# Patient Record
Sex: Male | Born: 1955 | Race: Black or African American | Hispanic: No | Smoking: Current every day smoker
Health system: Southern US, Community
[De-identification: ages and names within clinical notes are randomized; demographics above are authoritative.]

## PROBLEM LIST (undated history)

## (undated) ENCOUNTER — Emergency Department: Discharge status: Absent without leave | Payer: Medicare Other

## (undated) DIAGNOSIS — F329 Major depressive disorder, single episode, unspecified: Secondary | ICD-10-CM

## (undated) DIAGNOSIS — I509 Heart failure, unspecified: Secondary | ICD-10-CM

## (undated) DIAGNOSIS — R7989 Other specified abnormal findings of blood chemistry: Secondary | ICD-10-CM

## (undated) DIAGNOSIS — N186 End stage renal disease: Secondary | ICD-10-CM

## (undated) DIAGNOSIS — N289 Disorder of kidney and ureter, unspecified: Secondary | ICD-10-CM

## (undated) DIAGNOSIS — F32A Depression, unspecified: Secondary | ICD-10-CM

## (undated) DIAGNOSIS — I5189 Other ill-defined heart diseases: Secondary | ICD-10-CM

## (undated) DIAGNOSIS — I2699 Other pulmonary embolism without acute cor pulmonale: Secondary | ICD-10-CM

## (undated) DIAGNOSIS — I77810 Thoracic aortic ectasia: Secondary | ICD-10-CM

## (undated) DIAGNOSIS — I2510 Atherosclerotic heart disease of native coronary artery without angina pectoris: Secondary | ICD-10-CM

## (undated) DIAGNOSIS — I219 Acute myocardial infarction, unspecified: Secondary | ICD-10-CM

## (undated) DIAGNOSIS — R778 Other specified abnormalities of plasma proteins: Secondary | ICD-10-CM

## (undated) DIAGNOSIS — I10 Essential (primary) hypertension: Secondary | ICD-10-CM

## (undated) HISTORY — PX: TOTAL HIP ARTHROPLASTY: SHX124

---

## 2004-08-28 ENCOUNTER — Other Ambulatory Visit: Payer: Self-pay

## 2004-08-28 ENCOUNTER — Inpatient Hospital Stay: Payer: Self-pay | Admitting: Internal Medicine

## 2008-06-18 ENCOUNTER — Inpatient Hospital Stay (HOSPITAL_COMMUNITY): Admission: EM | Admit: 2008-06-18 | Discharge: 2008-06-23 | Payer: Self-pay | Admitting: Emergency Medicine

## 2008-06-18 ENCOUNTER — Ambulatory Visit: Payer: Self-pay | Admitting: Internal Medicine

## 2008-06-19 ENCOUNTER — Encounter (INDEPENDENT_AMBULATORY_CARE_PROVIDER_SITE_OTHER): Payer: Self-pay | Admitting: *Deleted

## 2008-06-27 ENCOUNTER — Encounter (INDEPENDENT_AMBULATORY_CARE_PROVIDER_SITE_OTHER): Payer: Self-pay | Admitting: Internal Medicine

## 2008-06-27 ENCOUNTER — Ambulatory Visit: Payer: Self-pay | Admitting: Infectious Diseases

## 2008-06-27 DIAGNOSIS — I10 Essential (primary) hypertension: Secondary | ICD-10-CM

## 2008-06-27 LAB — CONVERTED CEMR LAB
BUN: 18 mg/dL (ref 6–23)
Calcium: 9 mg/dL (ref 8.4–10.5)
Creatinine, Ser: 1.62 mg/dL — ABNORMAL HIGH (ref 0.40–1.50)
Glucose, Bld: 77 mg/dL (ref 70–99)
Potassium: 3.9 meq/L (ref 3.5–5.3)

## 2008-06-28 DIAGNOSIS — B1710 Acute hepatitis C without hepatic coma: Secondary | ICD-10-CM

## 2008-06-28 DIAGNOSIS — F1910 Other psychoactive substance abuse, uncomplicated: Secondary | ICD-10-CM

## 2008-06-28 DIAGNOSIS — E785 Hyperlipidemia, unspecified: Secondary | ICD-10-CM

## 2008-07-22 ENCOUNTER — Encounter: Payer: Self-pay | Admitting: *Deleted

## 2008-07-22 LAB — CONVERTED CEMR LAB: LDL Cholesterol: 151 mg/dL

## 2010-10-28 ENCOUNTER — Emergency Department (HOSPITAL_COMMUNITY): Payer: Self-pay

## 2010-10-28 ENCOUNTER — Inpatient Hospital Stay (HOSPITAL_COMMUNITY)
Admission: EM | Admit: 2010-10-28 | Discharge: 2010-10-30 | DRG: 305 | Discharge status: Against Medical Advice | Payer: Self-pay | Attending: Infectious Diseases | Admitting: Infectious Diseases

## 2010-10-28 DIAGNOSIS — R079 Chest pain, unspecified: Secondary | ICD-10-CM

## 2010-10-28 DIAGNOSIS — E785 Hyperlipidemia, unspecified: Secondary | ICD-10-CM | POA: Diagnosis present

## 2010-10-28 DIAGNOSIS — R0789 Other chest pain: Secondary | ICD-10-CM | POA: Diagnosis present

## 2010-10-28 DIAGNOSIS — F1410 Cocaine abuse, uncomplicated: Secondary | ICD-10-CM | POA: Diagnosis present

## 2010-10-28 DIAGNOSIS — B1920 Unspecified viral hepatitis C without hepatic coma: Secondary | ICD-10-CM | POA: Diagnosis present

## 2010-10-28 DIAGNOSIS — G43909 Migraine, unspecified, not intractable, without status migrainosus: Secondary | ICD-10-CM | POA: Diagnosis present

## 2010-10-28 DIAGNOSIS — I10 Essential (primary) hypertension: Principal | ICD-10-CM | POA: Diagnosis present

## 2010-10-28 DIAGNOSIS — F17200 Nicotine dependence, unspecified, uncomplicated: Secondary | ICD-10-CM | POA: Diagnosis present

## 2010-10-28 DIAGNOSIS — F1210 Cannabis abuse, uncomplicated: Secondary | ICD-10-CM | POA: Diagnosis present

## 2010-10-28 LAB — POCT CARDIAC MARKERS
CKMB, poc: 2.6 ng/mL (ref 1.0–8.0)
Troponin i, poc: <0.05 ng/mL (ref 0.00–0.09)

## 2010-10-28 LAB — MRSA PCR SCREENING: MRSA by PCR: NEGATIVE

## 2010-10-28 LAB — CBC
MCV: 86.5 fL (ref 78.0–100.0)
Platelets: 145 10*3/uL — ABNORMAL LOW (ref 150–400)
RDW: 13.6 % (ref 11.5–15.5)
WBC: 8.2 10*3/uL (ref 4.0–10.5)

## 2010-10-28 LAB — COMPREHENSIVE METABOLIC PANEL
BUN: 17 mg/dL (ref 6–23)
Calcium: 8.7 mg/dL (ref 8.4–10.5)
Glucose, Bld: 103 mg/dL — ABNORMAL HIGH (ref 70–99)
Total Protein: 7.1 g/dL (ref 6.0–8.3)

## 2010-10-28 LAB — RAPID URINE DRUG SCREEN, HOSP PERFORMED
Cocaine: NOT DETECTED
Opiates: POSITIVE — AB

## 2010-10-28 LAB — URINALYSIS, ROUTINE W REFLEX MICROSCOPIC
Bilirubin Urine: NEGATIVE
Hgb urine dipstick: NEGATIVE
Specific Gravity, Urine: 1.012 (ref 1.005–1.030)
Urobilinogen, UA: 1 mg/dL (ref 0.0–1.0)
pH: 6.5 (ref 5.0–8.0)

## 2010-10-28 LAB — LIPASE, BLOOD: Lipase: 38 U/L (ref 11–59)

## 2010-10-28 LAB — GLUCOSE, CAPILLARY: Glucose-Capillary: 106 mg/dL — ABNORMAL HIGH (ref 70–99)

## 2010-10-28 LAB — DIFFERENTIAL
Eosinophils Absolute: 0.1 10*3/uL (ref 0.0–0.7)
Eosinophils Relative: 1 % (ref 0–5)
Lymphs Abs: 1.3 10*3/uL (ref 0.7–4.0)

## 2010-10-28 LAB — PROTIME-INR: INR: 0.97 (ref 0.00–1.49)

## 2010-10-28 LAB — CK TOTAL AND CKMB (NOT AT ARMC)
Relative Index: 1.8 (ref 0.0–2.5)
Total CK: 160 U/L (ref 7–232)

## 2010-10-28 LAB — URINE MICROSCOPIC-ADD ON

## 2010-10-28 LAB — ETHANOL: Alcohol, Ethyl (B): <5 mg/dL (ref 0–10)

## 2010-10-28 LAB — TSH: TSH: 0.364 u[IU]/mL (ref 0.350–4.500)

## 2010-10-28 LAB — MAGNESIUM: Magnesium: 1.9 mg/dL (ref 1.5–2.5)

## 2010-10-29 ENCOUNTER — Inpatient Hospital Stay (HOSPITAL_COMMUNITY): Payer: Self-pay

## 2010-10-29 DIAGNOSIS — R079 Chest pain, unspecified: Secondary | ICD-10-CM

## 2010-10-29 LAB — BASIC METABOLIC PANEL
CO2: 23 mEq/L (ref 19–32)
Calcium: 8.3 mg/dL — ABNORMAL LOW (ref 8.4–10.5)
Calcium: 8.7 mg/dL (ref 8.4–10.5)
Creatinine, Ser: 1.22 mg/dL (ref 0.4–1.5)
Creatinine, Ser: 1.05 mg/dL (ref 0.4–1.5)
GFR calc Af Amer: >60 mL/min (ref >60)
Glucose, Bld: 148 mg/dL — ABNORMAL HIGH (ref 70–99)
Sodium: 137 mEq/L (ref 135–145)

## 2010-10-29 LAB — LIPID PANEL
Cholesterol: 170 mg/dL (ref 0–200)
LDL Cholesterol: 105 mg/dL — ABNORMAL HIGH (ref 0–99)
Triglycerides: 129 mg/dL (ref <150)
VLDL: 26 mg/dL (ref 0–40)

## 2010-10-29 LAB — CBC
MCHC: 35.8 g/dL (ref 30.0–36.0)
RDW: 13.7 % (ref 11.5–15.5)

## 2010-10-29 LAB — CARDIAC PANEL(CRET KIN+CKTOT+MB+TROPI)
CK, MB: 2.9 ng/mL (ref 0.3–4.0)
Total CK: 136 U/L (ref 7–232)
Troponin I: 0.04 ng/mL (ref 0.00–0.06)

## 2010-10-29 LAB — HEMOGLOBIN A1C
Hgb A1c MFr Bld: 5.7 % — ABNORMAL HIGH (ref <5.7)
Mean Plasma Glucose: 117 mg/dL — ABNORMAL HIGH (ref <117)

## 2010-10-29 LAB — HIV ANTIBODY (ROUTINE TESTING W REFLEX): HIV: NONREACTIVE

## 2010-10-30 LAB — BASIC METABOLIC PANEL
BUN: 17 mg/dL (ref 6–23)
Calcium: 8.9 mg/dL (ref 8.4–10.5)
Creatinine, Ser: 1.31 mg/dL (ref 0.4–1.5)
GFR calc non Af Amer: 57 mL/min — ABNORMAL LOW (ref >60)
Glucose, Bld: 103 mg/dL — ABNORMAL HIGH (ref 70–99)
Sodium: 138 mEq/L (ref 135–145)

## 2010-10-30 LAB — CBC
HCT: 41.9 % (ref 39.0–52.0)
MCH: 30.7 pg (ref 26.0–34.0)
MCHC: 35.3 g/dL (ref 30.0–36.0)
RDW: 14.1 % (ref 11.5–15.5)

## 2010-10-31 DIAGNOSIS — R079 Chest pain, unspecified: Secondary | ICD-10-CM

## 2010-11-11 NOTE — Discharge Summary (Signed)
[name], [name] NO.:  [id number]  MEDICAL RECORD NO.:  [id number]           PATIENT TYPE:  I  LOCATION:  [id number]                         FACILITY:  [facility]  PHYSICIAN:  Alison Murray, M.D.  DATE OF BIRTH:  1956-01-24  DATE OF ADMISSION:  10/28/2010 DATE OF DISCHARGE:  10/30/2010                              DISCHARGE SUMMARY   This patient left against medical advice on day #3 of his admission.  DISCHARGE DIAGNOSES: 1. Chest pain, cause unknown. The patient did not have any evidence of acute coronary syndrome on his     EKG or cardiac enzymes during this admission. 2. Uncontrolled hypertension.  The patient's blood pressure ranged in     systolic [id number] during this hospitalization, on medical     management.  He is not compliant with prescribed medicines as     outpatient. 3. Headache, probably secondary to acute migraine.  A noncontrast CT     head negative for any evidence of bleeding or mass. 4. Hepatitis C.  The patient has refused treatment in the past. 5. Polysubstance abuse.  The patient has a history of smoking tobacco,     marijuana, and cocaine in the past. 6. Hyperlipidemia.  PROCEDURES PERFORMED:  Noncontrast CT scan of the head for nonresolving headache which was negative for any intracranial abnormality.  It did show mild atrophy and scattered lacunar infarct and periventricular chronic ischemic white matter disease.  This probably from his uncontrolled hypertension for years.  CONSULTATIONS:  None.  ADMITTING HISTORY AND PHYSICAL:  A 55 year old man with past medical history dictated above, presented to the emergency room with chief complaint of chest pain and headache since night prior to admission.  He complained of having sharp lower sternal to epigastric pain, continuous, since 1 night prior to admission, started when he was sitting in a chair, 10/10 in intensity, getting worse and is not associated with any food intake.  Also,  it is not positional and nothing makes it better or worse.  Denies any shortness of breath, cough, fevers, or chills.  He also complained of bifrontal headache which is also 10/10, continuous, dull, not associated with vision change or photophobia.  He denied any recent cough, sore throat, or nasal drip.  ADMISSION PHYSICAL EXAMINATION:  VITAL SIGNS:  Temperature 99.7, pulse 80-90, blood pressure 0000000 systolic, [id number] diastolic, respiratory rate 18, oxygen saturation 100% on room air. GENERAL:  No acute distress.  Mild discomfort from headache. HEENT: Pupils equal and reactive to light.  Extraocular muscles intact. NECK:  Supple.  No JVD.  No carotid bruits. LUNGS:  Clear to auscultation bilaterally. CARDIOVASCULAR SYSTEM:  S1 and S2 normal.  Regular rate and rhythm. ABDOMEN:  Soft, mildly tender to palpation on epigastric area.  No guarding or rigidity.  No hepatosplenomegaly. EXTREMITIES:  No edema bilaterally. NEUROLOGIC:  Alert and oriented x3.  Motor and sensory exam normal. Reflexes normal and equal bilaterally.  Gait normal.  Cerebellar signs negative.  LABS AT ADMISSION:  His hemoglobin, white count, platelet count, and blood chemistry were unremarkable at admission.  His liver function test did show an elevated AST  of 87 and ALT of 110.  Point-of-care cardiac markers were negative.  Urine drug screen was positive for opiates which was prior to him receiving opiates in the emergency room.  HOSPITAL COURSE: 1. Chest pain.  The patient did complain of severe chest pain on     admission but no objective evidence of ACS on his EKG or cardiac     enzymes.  Initially, it was considered this pain was secondary to     severe hypertension or migraine headache.  We did not pursue aortic     dissection as he did not have typical pain and his blood pressure     was equal in both of his arms.  He did not have any risk factors     for pulmonary embolism and we did not proceed with [place]  further     imaging for this problem.  We kept him on telemetry unit and it did     not show any evidence of cardiac arrhythmias during his hospital     stay here.  We cycled his EKG and cardiac enzymes during this     hospital course without any evidence of acute coronary syndrome.     We obtained a lipid profile to stratify his risk for coronary     artery disease which showed LDL of 105, HDL of 39, and a total     cholesterol of 170.  His HbA1c was 5.7 and TSH was normal.  His     chest pain was better by day #2 of hospitalization without any     acute intervention.  He left the hospital against medical advice     this time and we have not worked this up further as the cause of     his chest pain. 2. Hypertension.  The patient's blood pressure was uncontrolled at the     time of admission.  His systolic ranged between 0000000.  He was     supposed to be on beta-blocker, ACE inhibitor, HCTZ prior to     admission, but he does not take any medicines at home as per his     history.  We started him on these medicines which was supposed to     take prior to admission and his blood pressure stayed fairly     controlled, although was not all the way back to normal.  We were     not able to give him any prescriptions and instructions prior to     discharge because he left the hospital against medical advice. 3. Headache.  The patient did complain of acute headache, bilateral     frontal headache at the time of admission without any red flags.     We tried Tylenol and nonsteroidal antiinflammatory drugs for his     headaches, but was not controlled.  We also got his noncontrast CT     head which was negative for any acute cerebral event for the cause     of his headaches.  We did not give him any Triptans and did not     start any management for migraine headaches given his high blood     pressure.  He left the hospital against medical advice by this time     and we have not worked this up  further.  LABORATORY DATA:  Prior to leaving the hospital, sodium 138, potassium 3.6, chloride 104, bicarb 26, glucose 103, BUN 17, creatinine 1.31, calcium 8.9.  White count 7.4, hemoglobin 14.8, platelet count 148.  HIV nonreactive.  DISCHARGE VITALS:  Temperature 98.4, pulse 69, blood pressure 164/125, oxygen saturation 98% on room air.     Lester Stanly, MD   ______________________________ Alison Murray, M.D.    MD/MEDQ  D:  11/01/2010  T:  11/02/2010  Job:  [id number]  Electronically Signed by Lester Middleton  on 11/10/2010 12:12:55 PM Electronically Signed by Lars Mage M.D. on 11/11/2010 02:04:31 PM

## 2010-11-23 NOTE — Consult Note (Signed)
[name], [name] NO.:  [id number]   MEDICAL RECORD NO.:  [id number]          PATIENT TYPE:  INP   LOCATION:  2921                         FACILITY:  [facility]   PHYSICIAN:  Shaune Pascal. Bensimhon, MDDATE OF BIRTH:  1956-07-08   DATE OF CONSULTATION:  06/18/2008  DATE OF DISCHARGE:                                 CONSULTATION   PRIMARY CARDIOLOGIST:  Shaune Pascal. Bensimhon, MD   PRIMARY CARE PHYSICIAN:  The patient has been seen by [facility] doctor in the  past, but not with any recent visits.   REQUESTING PHYSICIAN:  Resident for family practice.   REASON FOR CONSULTATION:  Chest pain and hypertensive urgency.   HISTORY OF PRESENT ILLNESS:  This is a 55 year old obese African  American male with known of hypertension who has now been taking  medications for over 4 years but has been diagnosed with hypertension  for greater than 10 years.  The patient came to the emergency room  secondary to chest pain which began yesterday, which he described as  sharp in midsternal, constant and worsening last night with no change in  intensity since that time.  The patient was brought to the emergency  room and found to be severely hypertensive with a blood pressure of  209/156.  The patient was given nitroglycerin and started on a drip, and  we are asked to see as a result.  The patient did have positive cardiac  enzymes at 0.18.  The patient is complaining of substernal chest  discomfort and has admitted to use of cocaine within the last 48 hours.  The patient also admits to beer and marijuana use within the last 48  hours with prior abuse of EtOH and cocaine and marijuana.  He states  that he stopped for approximately 1 month but has returned to the  habits.  Currently, the patient is in the ER with remaining hypertensive  and nitroglycerin with complaints of chest discomfort.   PAST MEDICAL HISTORY:  1. Hypertension greater than 10 years.  2. Medical noncompliance.   SOCIAL  HISTORY:  He lives in [place] with his wife.  He is in  roofing.  He has 2 boys.  He has a 15+ pack year smoker, heavy beer  drinking and use of cocaine and marijuana within the last 48 hours.   FAMILY HISTORY:  Mother, father, and siblings with hypertension.   CURRENT MEDICATIONS:  None.   ALLERGIES:  None.   CURRENT LABS:  CK 525, MB 4.0, troponin 0.18, albumin 3.4, AST 63, total  troponin 7.3, and lipase 25.  Sodium 140, potassium 3.2, chloride 100,  CO2 of 29, BUN 10, creatinine 1.7, and glucose 83.  Chest x-ray  revealing cardiomegaly.  Head CT revealing no discrete intracranial  process.  EKG revealing sinus tachycardia at 97% beats per minute with  incomplete right bundle-branch block and left ventricular hypertrophy.  He has a prolonged QT interval at 0.495.   PHYSICAL EXAMINATION:  VITAL SIGNS:  Currently, blood pressure 199/156,  heart rate 95, and respirations 16.  HEENT:  Head is normocephalic and atraumatic.  Eyes, PERRLA.  Mucous  membranes of mouth are pink and moist.  Tongue is midline.  NECK:  Supple without JVD or carotid bruits appreciated.  CARDIOVASCULAR:  Regular rate and rhythm, slightly tachycardic with S4  murmur auscultated.  Pulses are 2+ and equal without bruits.  LUNGS:  Clear to auscultation with some mild crackles in the left base.  ABDOMEN:  Obese and nontender.  Bowel sounds are 2+.  EXTREMITIES:  Without clubbing, cyanosis, or edema.  NEUROLOGIC:  Cranial nerves II through XII are grossly intact.   IMPRESSION:  1. Hypertensive urgency.  2. Admit to cocaine and marijuana use within the last 48 hours.  3. Ethyl alcohol use.  4. Medical noncompliance.   PLAN:  This is a 55 year old obese African American male who is admitted  to the hospital with chest pain.  We were asked to consult secondary to  hypertensive urgency and positive cardiac enzyme.  The patient's chest  pain is fairly atypical.  In fact, most pain seems to be epigastric.  EKG is  nondiagnostic.  My suspicion is that increased troponins and MB  are likely secondary to hypertensive heart disease.  However, given  myriad of risk factors, we will proceed with catheterization once blood  pressure is stabilized and renal function is also stabilized.  Blood  pressure control, her primary to keep blood pressure 160-170 for now and  began to reduce it slowly to avoid CVA.  In the interim, the patient  will be placed on Coreg 6.25 mg b.i.d.  We will also give hydralazine 10  mg q.2 h. p.r.n.  We will also check lipids and LFTs, monitor renal  function and follow making further recommendations throughout hospital  course depending upon the patient's response to treatment.  We would  like to thank the [facility] for allowing [place] to  participate in the care of this patient.      Phill Myron. Purcell Nails, NP      Butts Bensimhon, MD  Electronically Signed    KML/MEDQ  D:  06/18/2008  T:  06/19/2008  Job:  [id number]

## 2010-11-23 NOTE — Discharge Summary (Signed)
NAMEMACALEB, SWINEA NO.:  [id number]   MEDICAL RECORD NO.:  [id number]          PATIENT TYPE:  INP   LOCATION:  5501                         FACILITY:  [facility]   PHYSICIAN:  Rico Sheehan, D.O.  DATE OF BIRTH:  [birth year]   DATE OF ADMISSION:  06/18/2008  DATE OF DISCHARGE:  06/23/2008                               DISCHARGE SUMMARY   DISCHARGE DIAGNOSES:  1. 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  2. Hypertensive urgency.  3. Polysubstance abuse, including ethanol and cocaine.  4. Hepatitis C, newly diagnosed, viral load 895,000.  5. Hypertension, untreated.  6. History of migraine headache, per patient.  7. Obesity.  8. Hyperlipidemia.  9. Tobacco abuse.   DISCHARGE MEDICATIONS:  1. Coreg 25 mg p.o. b.i.d.  2. Lisinopril - hydrochlorothiazide 20 - 25 mg p.o. daily.  3. Aspirin 325 mg p.o. daily.  4. Tylenol 650 mg p.o. q.6 h p.r.n. pain.   DISCHARGE CONDITION AND FOLLOWUP:  The patient was discharged in stable  condition, his chest pain having resolved and his blood pressure having  improved on oral medication.  He was instructed to stop using alcohol  and cocaine.  At first, he requested to have follow up at the [facility] in  [place].  However, his paperwork with them concerning eligibility was not  up-to-date and they were unable to offer him an appointment prior to  February 2010.  Therefore, he was offered an appointment at the [facility] with Dr. Caryn Section On Friday, December 18, at 1:50  p.m.  At this appointment, the items requiring attention are:  1. Blood pressure.  In the hospital, this ranged from approximately      0000000 systolic and he was started on Coreg and      lisinopril/hydrochlorothiazide.  2. Renal function:  The patient is to have a stat. BMET prior to the      appointment and this should be checked for creatinine and      potassium.  The patient's creatinine in the  hospital ranged from      approximately 1.4-1.7.  The patient's potassium was in the 3.1-3.6      range while in the hospital.  3. Hyperlipidemia:  The patient is at risk due to his hyperlipidemia      and hypertension.  However, he also has active hepatitis C.  It is      worthwhile discussing with him whether he is interested starting a      statin at this point.  On the other hand, it he intends to follow      up at the [facility], this issue may be deferred to his doctor      there.  4. Hepatitis C.  The patient has newly diagnosed hepatitis.  He was      not interested in treatment for this while in the hospital.  If he      intends to follow up at our clinic in [place], he may benefit      from referral to the Hepatitis  C Clinic.   PROCEDURES PERFORMED:  Hepatitis C:  1. Noncontrast CT head dated June 18, 2008, showed no acute      intracranial process.  There were chronic small vessel ischemic      changes noted.  2. A 2-D echocardiogram showed severe asymmetric left ventricular      hypertrophy with EF 55%.  3. Cardiac catheterization dated June 20, 2008, showed normal      coronary angiography, no evidence of renal artery stenosis and      severe hypertension with good left ventricular function and marked      LVH by echocardiography.   CONSULTATIONS:  Dr. Haroldine Laws of Cardiology and Dr. Olevia Perches of  Cardiology.   BRIEF ADMITTING HISTORY AND PHYSICAL:  The patient is a 55 year old male  who presented to the [facility] Emergency Department with chest pain  that started approximately 3:00 p.m. the day prior to admission.  Location was substernal and epigastric without radiation or accompanying  symptoms.  It was 10/10 and constant and had not responded to  medications in the ED.  There were no relieving or worsening factors  including eating, drinking, positions, or deep breaths.  No prior  similar symptoms.  He had no nausea or vomiting.  He did note taking   Excedrin frequently for headaches in the days prior to admission, but he  could not recall how much.  He did admit to recent relapse and using  cocaine and alcohol 2 weeks prior to admission, the last time within 24  hours of admission.   PHYSICAL EXAMINATION:  VITAL SIGNS:  Temperature 98.1, blood pressure  199/156, pulse 99, respirations 16, and oxygen saturation 96% room air.  GENERAL:  He was an obese, middle-aged male in moderate distress due to  pain.  HEENT:  Pupils were 1 mm bilaterally and poorly reactive.  NECK:  No murmurs or masses.  LUNGS:  Clear to auscultation bilaterally with good air movement.  HEART:  Regular rate and rhythm with no murmurs, rubs, or gallops.  EXTREMITIES:  His peripheral pulses were 2+ in both the upper and lower  extremities.  Extremities had no edema.  ABDOMEN:  Obese.  Bowel sounds were active.  Abdomen was soft,  nontender, and nondistended.  No palpable masses or organomegaly.  SKIN:  No rashes or lesions.  NEUROLOGIC:  He was alert and oriented x3.  Cranial nerves grossly  intact and he was able to move all 4 extremities.   LABORATORY DATA:  Sodium 140, potassium 3.2, chloride 100, bicarbonate  29, BUN 10, creatinine 1.7, glucose 83, bilirubin 1.5, alk phos 56, AST  63, ALT 60, protein 7.3, albumin 3.4, and calcium 9.4.  White blood  count 8.9, hemoglobin 15.1, and platelets 197.  Ethanol less than 5.  Lipase 25.  Urinalysis significant for protein greater than 300.  Point-  of-care cardiac markers, CK 525 and troponin 0.18.  Urine drug screen  positive for cocaine and opiates.  EKG showed poor R-wave progression  and strain pattern.   HOSPITAL COURSE:  1. Chest pain:  Due to the patient's presentation and positive set of      point-of-care markers, Cardiology was consulted early.  The patient      was placed on a nitroglycerin drip and admitted to the step-down      unit.  His chest pain resolved over his first night of admission.      His  troponins decreased as his hypertensive urgency was controlled.  The 2-D echocardiograms and cardiac catheterizations were obtained      with the findings described above.  The patient was counseled on      the importance of abstaining from cocaine and ethanol.  The      importance of adhering to his antihypertensive medications was also      stressed.  2. Hypertension:  The patient was initially placed on a nitroglycerin      drip with hydralazine p.r.n..  He was then started on Coreg and      amlodipine.  He was transitioned off the nitroglycerin drip and      lisinopril and hydrochlorothiazide were started as well.  His blood      pressure continued to fluctuate between the 0000000 and A999333 systolic      with the diastolic ranging from 0000000 to 130s.  He was discharged      with the medications described above.  He may need to have      amlodipine in addition to these.  Affording amlodipine may be an      issue unless he can arrange to get this through the [facility].  3. Chronic renal insufficiency:  The patient's creatinine on admission      was 1.7.  This trended down to 1.4-1.5 range.  His true baseline is      not known.  The elevated creatinine and proteinuria are strongly      suggestive that the etiology is due to chronic uncontrolled      hypertension.  He was placed on an ACE inhibitor for renal      protective effects.  4. Polysubstance abuse:  The patient was counseled on importance of      abstaining from cocaine in order to prevent further damage to his      heart and kidneys and the risk of stroke.  5. Hepatitis C:  Due to the patient's elevated transaminases, an acute      hepatitis panel was checked and this revealed that he was reactive      for hepatitis C.  This was a new diagnosis and a viral load was      checked which was 895,000.  The patient declined treatment for this      which was offered while he was an inpatient.  This item will      require follow up as an  outpatient.  6. Hyperlipidemia:  The patient was not started on a statin due to his      active hepatitis C.  As an outpatient, it would be appropriate to      discuss the risks and benefits for him starting a statin.  7. Tobacco abuse.  The patient was counseled on the importance of      quitting smoking.   DISCHARGE LABORATORIES AND VITALS:  Temperature [id number], systolic blood  pressure 123456, diastolic blood pressure A999333, pulse 95-64,  respirations 20, and oxygen saturation 95-99% room air.  Sodium 138,  potassium 3.9, chloride 107, bicarbonate 27, BUN 14, creatinine 1.5, and  glucose 89.  The patient's lipid panel was cholesterol 198, HDL 33, LDL  151, and triglycerides 71.      Lajean Saver, MD  Electronically Signed      Rico Sheehan, D.O.  Electronically Signed    PN/MEDQ  D:  06/24/2008  T:  06/25/2008  Job:  [id number]   cc:   [facility]

## 2010-11-23 NOTE — Cardiovascular Report (Signed)
NAMEANSUMANA, GENS NO.:  [id number]   MEDICAL RECORD NO.:  [id number]          PATIENT TYPE:  INP   LOCATION:  2921                         FACILITY:  [facility]   PHYSICIAN:  Vanna Scotland. Olevia Perches, MD, FACCDATE OF BIRTH:  [birth year]   DATE OF PROCEDURE:  06/20/2008  DATE OF DISCHARGE:                            CARDIAC CATHETERIZATION   CLINICAL HISTORY:  Mr. Borth is a 55 year old and has no prior history  of known heart disease.  He does have a long history of hypertension.  He is admitted with hypertensive urgency and his troponins returned  positive.  He did have chest pain, which is somewhat atypical for  ischemia.  His screen for cocaine was positive.   PROCEDURE:  The procedure was performed with right femoral artery, an  arterial sheath, and 5-French Judkins coronary catheters.  Left  ventricular pressures were measured, but a left ventriculogram was not  performed to save contrast.  His creatinine was 1.7.  This aortogram was  performed to evaluate for possible renovascular hypertension.  The  patient tolerated the procedure well and left the laboratory in  satisfactory condition.   RESULTS:  The left main coronary artery:  The left main coronary artery  was free of any significant disease.   Circumflex artery:  The circumflex artery gave rise to a large marginal  branch and a large posterolateral branch.  These vessels were free of  any significant disease.   Left anterior descending artery:  Left anterior descending artery gave  rise to 2 diagonal branches and several septal perforators.  The vessel  was somewhat underfilled, but with multiple views, there appeared to be  no significant obstruction.   Right coronary artery:  The right coronary artery was a moderate-sized  vessel gave rise to conus branch, 2 right ventricle branches, posterior  branch, and 2 posterolateral branches.  There were some irregularities  in the vessel, but no significant  obstruction.   No left ventriculogram was performed.   A distal aortogram:  A distal aortogram was performed, which showed  patent renal arteries and no significant aortoiliac obstruction.   Aortic pressure was 167/122 with a mean of 143.  Left ventricle pressure  was 167/36.   CONCLUSION:  1. Normal coronary angiography.  2. No evidence of renal artery stenosis.  3. Severe hypertension with good LV function and marked LVH by      echocardiography.   RECOMMENDATIONS:  In view of these findings, I think the patient's  elevated troponin may be related to cocaine and possibly hypertensive  urgency.  The main issue at this point is further treatment of his  hypertension and avoidance of substance abuse.      Bruce Alfonso Patten Olevia Perches, MD, [facility]  Electronically Signed     BRB/MEDQ  D:  06/20/2008  T:  06/21/2008  Job:  [id number]   cc:   Rico Sheehan, D.O.  Cardiopulmonary Lab

## 2011-03-10 ENCOUNTER — Emergency Department: Payer: Self-pay | Admitting: Orthopedic Surgery

## 2011-04-05 ENCOUNTER — Emergency Department: Payer: Self-pay | Admitting: Emergency Medicine

## 2011-04-15 LAB — URINALYSIS, ROUTINE W REFLEX MICROSCOPIC
Glucose, UA: NEGATIVE mg/dL
Leukocytes, UA: NEGATIVE
Protein, ur: >300 mg/dL — AB
Specific Gravity, Urine: 1.021 (ref 1.005–1.030)
Urobilinogen, UA: 1 mg/dL (ref 0.0–1.0)

## 2011-04-15 LAB — DIFFERENTIAL
Basophils Absolute: 0 10*3/uL (ref 0.0–0.1)
Basophils Absolute: 0 10*3/uL (ref 0.0–0.1)
Basophils Relative: 0 % (ref 0–1)
Basophils Relative: 0 % (ref 0–1)
Eosinophils Absolute: 0 10*3/uL (ref 0.0–0.7)
Eosinophils Relative: 0 % (ref 0–5)
Lymphocytes Relative: 14 % (ref 12–46)
Lymphocytes Relative: 28 % (ref 12–46)
Lymphs Abs: 1.2 10*3/uL (ref 0.7–4.0)
Monocytes Absolute: 0.5 10*3/uL (ref 0.1–1.0)
Monocytes Absolute: 0.6 10*3/uL (ref 0.1–1.0)
Monocytes Relative: 6 % (ref 3–12)
Monocytes Relative: 7 % (ref 3–12)
Neutro Abs: 5.7 10*3/uL (ref 1.7–7.7)
Neutro Abs: 7 10*3/uL (ref 1.7–7.7)
Neutrophils Relative %: 65 % (ref 43–77)
Neutrophils Relative %: 80 % — ABNORMAL HIGH (ref 43–77)

## 2011-04-15 LAB — HEPATITIS PANEL, ACUTE
HCV Ab: REACTIVE — AB
Hep A IgM: NEGATIVE
Hep B C IgM: NEGATIVE
Hepatitis B Surface Ag: NEGATIVE

## 2011-04-15 LAB — CBC
HCT: 39.7 % (ref 39.0–52.0)
HCT: 44.8 % (ref 39.0–52.0)
Hemoglobin: 12.8 g/dL — ABNORMAL LOW (ref 13.0–17.0)
Hemoglobin: 12.9 g/dL — ABNORMAL LOW (ref 13.0–17.0)
Hemoglobin: 13.3 g/dL (ref 13.0–17.0)
Hemoglobin: 15.1 g/dL (ref 13.0–17.0)
MCHC: 33.5 g/dL (ref 30.0–36.0)
MCHC: 33.8 g/dL (ref 30.0–36.0)
MCHC: 33.9 g/dL (ref 30.0–36.0)
MCV: 87.2 fL (ref 78.0–100.0)
Platelets: 174 10*3/uL (ref 150–400)
Platelets: 197 10*3/uL (ref 150–400)
RBC: 4.33 MIL/uL (ref 4.22–5.81)
RBC: 4.51 MIL/uL (ref 4.22–5.81)
RDW: 13.7 % (ref 11.5–15.5)
RDW: 13.7 % (ref 11.5–15.5)
RDW: 13.9 % (ref 11.5–15.5)
WBC: 7.1 10*3/uL (ref 4.0–10.5)

## 2011-04-15 LAB — BASIC METABOLIC PANEL
BUN: 14 mg/dL (ref 6–23)
BUN: 15 mg/dL (ref 6–23)
CO2: 25 mEq/L (ref 19–32)
CO2: 27 mEq/L (ref 19–32)
CO2: 27 mEq/L (ref 19–32)
Calcium: 8 mg/dL — ABNORMAL LOW (ref 8.4–10.5)
Calcium: 8.3 mg/dL — ABNORMAL LOW (ref 8.4–10.5)
Calcium: 8.6 mg/dL (ref 8.4–10.5)
Calcium: 8.7 mg/dL (ref 8.4–10.5)
Calcium: 9 mg/dL (ref 8.4–10.5)
Chloride: 105 mEq/L (ref 96–112)
Chloride: 107 mEq/L (ref 96–112)
Creatinine, Ser: 1.57 mg/dL — ABNORMAL HIGH (ref 0.4–1.5)
Creatinine, Ser: 1.62 mg/dL — ABNORMAL HIGH (ref 0.4–1.5)
Creatinine, Ser: 1.75 mg/dL — ABNORMAL HIGH (ref 0.4–1.5)
GFR calc Af Amer: 50 mL/min — ABNORMAL LOW (ref >60)
GFR calc Af Amer: 55 mL/min — ABNORMAL LOW (ref >60)
GFR calc non Af Amer: 41 mL/min — ABNORMAL LOW (ref >60)
GFR calc non Af Amer: 54 mL/min — ABNORMAL LOW (ref >60)
Glucose, Bld: 110 mg/dL — ABNORMAL HIGH (ref 70–99)
Glucose, Bld: 87 mg/dL (ref 70–99)
Glucose, Bld: 89 mg/dL (ref 70–99)
Sodium: 130 mEq/L — ABNORMAL LOW (ref 135–145)
Sodium: 137 mEq/L (ref 135–145)
Sodium: 138 mEq/L (ref 135–145)
Sodium: 140 mEq/L (ref 135–145)
Sodium: 140 mEq/L (ref 135–145)

## 2011-04-15 LAB — HEPATIC FUNCTION PANEL
Albumin: 2.9 g/dL — ABNORMAL LOW (ref 3.5–5.2)
Alkaline Phosphatase: 51 U/L (ref 39–117)
Indirect Bilirubin: 0.8 mg/dL (ref 0.3–0.9)
Total Bilirubin: 1.5 mg/dL — ABNORMAL HIGH (ref 0.3–1.2)

## 2011-04-15 LAB — COMPREHENSIVE METABOLIC PANEL
Albumin: 3.4 g/dL — ABNORMAL LOW (ref 3.5–5.2)
BUN: 10 mg/dL (ref 6–23)
Creatinine, Ser: 1.7 mg/dL — ABNORMAL HIGH (ref 0.4–1.5)
Glucose, Bld: 83 mg/dL (ref 70–99)
Total Protein: 7.3 g/dL (ref 6.0–8.3)

## 2011-04-15 LAB — PROTIME-INR
INR: 1 (ref 0.00–1.49)
Prothrombin Time: 13.2 seconds (ref 11.6–15.2)

## 2011-04-15 LAB — URINE MICROSCOPIC-ADD ON

## 2011-04-15 LAB — POCT CARDIAC MARKERS
CKMB, poc: 19 ng/mL (ref 1.0–8.0)
Myoglobin, poc: 390 ng/mL (ref 12–200)
Troponin i, poc: <0.05 ng/mL (ref 0.00–0.09)

## 2011-04-15 LAB — RAPID URINE DRUG SCREEN, HOSP PERFORMED
Amphetamines: NOT DETECTED
Barbiturates: NOT DETECTED
Benzodiazepines: NOT DETECTED
Opiates: POSITIVE — AB
Tetrahydrocannabinol: NOT DETECTED

## 2011-04-15 LAB — HIV 1/2 CONFIRMATION: HIV-2 Ab: NEGATIVE

## 2011-04-15 LAB — CARDIAC PANEL(CRET KIN+CKTOT+MB+TROPI)
CK, MB: 5.9 ng/mL — ABNORMAL HIGH (ref 0.3–4.0)
CK, MB: 9.4 ng/mL — ABNORMAL HIGH (ref 0.3–4.0)
Relative Index: 3.1 — ABNORMAL HIGH (ref 0.0–2.5)
Total CK: 166 U/L (ref 7–232)
Troponin I: 0.06 ng/mL (ref 0.00–0.06)

## 2011-04-15 LAB — LIPID PANEL
HDL: 33 mg/dL — ABNORMAL LOW (ref >39)
Triglycerides: 71 mg/dL (ref <150)
VLDL: 14 mg/dL (ref 0–40)

## 2011-04-15 LAB — CK TOTAL AND CKMB (NOT AT ARMC): Relative Index: 2.7 — ABNORMAL HIGH (ref 0.0–2.5)

## 2011-04-15 LAB — APTT: aPTT: 29 seconds (ref 24–37)

## 2011-04-15 LAB — HCV RNA QUANT
HCV Quantitative Log: 5.95 {Log} — ABNORMAL HIGH (ref <1.63)
HCV Quantitative: 895000 IU/mL — ABNORMAL HIGH (ref <43)

## 2011-04-15 LAB — MAGNESIUM: Magnesium: 2 mg/dL (ref 1.5–2.5)

## 2011-04-15 LAB — TROPONIN I: Troponin I: 0.18 ng/mL — ABNORMAL HIGH (ref 0.00–0.06)

## 2011-05-08 ENCOUNTER — Emergency Department: Payer: Self-pay | Admitting: Unknown Physician Specialty

## 2011-07-03 ENCOUNTER — Emergency Department: Payer: Self-pay | Admitting: Unknown Physician Specialty

## 2011-07-07 ENCOUNTER — Emergency Department: Payer: Self-pay | Admitting: Unknown Physician Specialty

## 2011-07-10 ENCOUNTER — Emergency Department: Payer: Self-pay | Admitting: Emergency Medicine

## 2014-01-01 LAB — CBC
HCT: 43.6 % (ref 40.0–52.0)
HGB: 14.5 g/dL (ref 13.0–18.0)
MCH: 28.8 pg (ref 26.0–34.0)
MCHC: 33.2 g/dL (ref 32.0–36.0)
MCV: 87 fL (ref 80–100)
Platelet: 185 10*3/uL (ref 150–440)
RBC: 5.02 10*6/uL (ref 4.40–5.90)
RDW: 13.8 % (ref 11.5–14.5)
WBC: 8.2 10*3/uL (ref 3.8–10.6)

## 2014-01-01 LAB — URINALYSIS, COMPLETE
BILIRUBIN, UR: NEGATIVE
Glucose,UR: NEGATIVE mg/dL (ref 0–75)
Ketone: NEGATIVE
Leukocyte Esterase: NEGATIVE
Nitrite: NEGATIVE
Ph: 6 (ref 4.5–8.0)
Protein: >=500
SPECIFIC GRAVITY: 1.017 (ref 1.003–1.030)
Squamous Epithelial: <1

## 2014-01-01 LAB — COMPREHENSIVE METABOLIC PANEL
ANION GAP: 7 (ref 7–16)
Albumin: 2.4 g/dL — ABNORMAL LOW (ref 3.4–5.0)
Alkaline Phosphatase: 77 U/L
BUN: 18 mg/dL (ref 7–18)
Bilirubin,Total: 0.4 mg/dL (ref 0.2–1.0)
CALCIUM: 8.5 mg/dL (ref 8.5–10.1)
CREATININE: 1.56 mg/dL — AB (ref 0.60–1.30)
Chloride: 109 mmol/L — ABNORMAL HIGH (ref 98–107)
Co2: 28 mmol/L (ref 21–32)
EGFR (African American): 57 — ABNORMAL LOW
EGFR (Non-African Amer.): 49 — ABNORMAL LOW
GLUCOSE: 104 mg/dL — AB (ref 65–99)
Osmolality: 289 (ref 275–301)
Potassium: 3.6 mmol/L (ref 3.5–5.1)
SGOT(AST): 90 U/L — ABNORMAL HIGH (ref 15–37)
SGPT (ALT): 109 U/L — ABNORMAL HIGH (ref 12–78)
SODIUM: 144 mmol/L (ref 136–145)
Total Protein: 6.6 g/dL (ref 6.4–8.2)

## 2014-01-01 LAB — CK: CK, TOTAL: 167 U/L

## 2014-01-02 ENCOUNTER — Inpatient Hospital Stay: Payer: Self-pay | Admitting: Internal Medicine

## 2014-01-03 LAB — BASIC METABOLIC PANEL
Anion Gap: 7 (ref 7–16)
BUN: 13 mg/dL (ref 7–18)
Calcium, Total: 8 mg/dL — ABNORMAL LOW (ref 8.5–10.1)
Chloride: 108 mmol/L — ABNORMAL HIGH (ref 98–107)
Co2: 27 mmol/L (ref 21–32)
Creatinine: 1.35 mg/dL — ABNORMAL HIGH (ref 0.60–1.30)
EGFR (Non-African Amer.): 59 — ABNORMAL LOW
Glucose: 85 mg/dL (ref 65–99)
Osmolality: 282 (ref 275–301)
POTASSIUM: 3.9 mmol/L (ref 3.5–5.1)
Sodium: 142 mmol/L (ref 136–145)

## 2014-01-03 LAB — CBC WITH DIFFERENTIAL/PLATELET
BASOS ABS: 0 10*3/uL (ref 0.0–0.1)
Basophil %: 0.4 %
Eosinophil #: 0 10*3/uL (ref 0.0–0.7)
Eosinophil %: 0.7 %
HCT: 38.8 % — AB (ref 40.0–52.0)
HGB: 13.1 g/dL (ref 13.0–18.0)
Lymphocyte #: 2 10*3/uL (ref 1.0–3.6)
Lymphocyte %: 34.2 %
MCH: 29.9 pg (ref 26.0–34.0)
MCHC: 33.8 g/dL (ref 32.0–36.0)
MCV: 88 fL (ref 80–100)
MONO ABS: 0.6 x10 3/mm (ref 0.2–1.0)
Monocyte %: 10.8 %
NEUTROS PCT: 53.9 %
Neutrophil #: 3.2 10*3/uL (ref 1.4–6.5)
Platelet: 158 10*3/uL (ref 150–440)
RBC: 4.39 10*6/uL — ABNORMAL LOW (ref 4.40–5.90)
RDW: 13.7 % (ref 11.5–14.5)
WBC: 5.9 10*3/uL (ref 3.8–10.6)

## 2014-01-05 LAB — BASIC METABOLIC PANEL
Anion Gap: 6 — ABNORMAL LOW (ref 7–16)
BUN: 27 mg/dL — ABNORMAL HIGH (ref 7–18)
CALCIUM: 8.2 mg/dL — AB (ref 8.5–10.1)
Chloride: 103 mmol/L (ref 98–107)
Co2: 28 mmol/L (ref 21–32)
Creatinine: 2.08 mg/dL — ABNORMAL HIGH (ref 0.60–1.30)
EGFR (African American): 40 — ABNORMAL LOW
GFR CALC NON AF AMER: 35 — AB
GLUCOSE: 93 mg/dL (ref 65–99)
OSMOLALITY: 279 (ref 275–301)
Potassium: 3.7 mmol/L (ref 3.5–5.1)
SODIUM: 137 mmol/L (ref 136–145)

## 2014-01-06 LAB — BASIC METABOLIC PANEL
ANION GAP: 5 — AB (ref 7–16)
BUN: 40 mg/dL — AB (ref 7–18)
Calcium, Total: 8.3 mg/dL — ABNORMAL LOW (ref 8.5–10.1)
Chloride: 105 mmol/L (ref 98–107)
Co2: 25 mmol/L (ref 21–32)
Creatinine: 2.01 mg/dL — ABNORMAL HIGH (ref 0.60–1.30)
EGFR (African American): 42 — ABNORMAL LOW
EGFR (Non-African Amer.): 36 — ABNORMAL LOW
Glucose: 94 mg/dL (ref 65–99)
OSMOLALITY: 280 (ref 275–301)
Potassium: 3.9 mmol/L (ref 3.5–5.1)
Sodium: 135 mmol/L — ABNORMAL LOW (ref 136–145)

## 2014-02-19 ENCOUNTER — Emergency Department: Payer: Self-pay | Admitting: Student

## 2014-02-19 LAB — BASIC METABOLIC PANEL
ANION GAP: 4 — AB (ref 7–16)
BUN: 15 mg/dL (ref 7–18)
Calcium, Total: 8.6 mg/dL (ref 8.5–10.1)
Chloride: 104 mmol/L (ref 98–107)
Co2: 31 mmol/L (ref 21–32)
Creatinine: 1.51 mg/dL — ABNORMAL HIGH (ref 0.60–1.30)
GFR CALC AF AMER: 59 — AB
GFR CALC NON AF AMER: 51 — AB
Glucose: 103 mg/dL — ABNORMAL HIGH (ref 65–99)
Osmolality: 279 (ref 275–301)
POTASSIUM: 3.3 mmol/L — AB (ref 3.5–5.1)
Sodium: 139 mmol/L (ref 136–145)

## 2014-02-19 LAB — CBC
HCT: 37.6 % — ABNORMAL LOW (ref 40.0–52.0)
HGB: 12.7 g/dL — ABNORMAL LOW (ref 13.0–18.0)
MCH: 29.7 pg (ref 26.0–34.0)
MCHC: 33.7 g/dL (ref 32.0–36.0)
MCV: 88 fL (ref 80–100)
Platelet: 146 10*3/uL — ABNORMAL LOW (ref 150–440)
RBC: 4.27 10*6/uL — AB (ref 4.40–5.90)
RDW: 13.6 % (ref 11.5–14.5)
WBC: 6.4 10*3/uL (ref 3.8–10.6)

## 2014-02-19 LAB — PRO B NATRIURETIC PEPTIDE: B-Type Natriuretic Peptide: 501 pg/mL — ABNORMAL HIGH (ref 0–125)

## 2014-02-19 LAB — TROPONIN I: TROPONIN-I: 0.02 ng/mL

## 2014-02-28 LAB — CBC
HCT: 42.4 % (ref 40.0–52.0)
HGB: 14.2 g/dL (ref 13.0–18.0)
MCH: 29.6 pg (ref 26.0–34.0)
MCHC: 33.5 g/dL (ref 32.0–36.0)
MCV: 88 fL (ref 80–100)
Platelet: 179 10*3/uL (ref 150–440)
RBC: 4.79 10*6/uL (ref 4.40–5.90)
RDW: 14 % (ref 11.5–14.5)
WBC: 6.1 10*3/uL (ref 3.8–10.6)

## 2014-02-28 LAB — COMPREHENSIVE METABOLIC PANEL
ANION GAP: 7 (ref 7–16)
AST: 40 U/L — AB (ref 15–37)
Albumin: 2.9 g/dL — ABNORMAL LOW (ref 3.4–5.0)
Alkaline Phosphatase: 62 U/L
BUN: 18 mg/dL (ref 7–18)
Bilirubin,Total: 0.3 mg/dL (ref 0.2–1.0)
Calcium, Total: 8.6 mg/dL (ref 8.5–10.1)
Chloride: 108 mmol/L — ABNORMAL HIGH (ref 98–107)
Co2: 28 mmol/L (ref 21–32)
Creatinine: 1.48 mg/dL — ABNORMAL HIGH (ref 0.60–1.30)
EGFR (Non-African Amer.): 52 — ABNORMAL LOW
GLUCOSE: 93 mg/dL (ref 65–99)
OSMOLALITY: 287 (ref 275–301)
POTASSIUM: 3.9 mmol/L (ref 3.5–5.1)
SGPT (ALT): 43 U/L
Sodium: 143 mmol/L (ref 136–145)
Total Protein: 7 g/dL (ref 6.4–8.2)

## 2014-02-28 LAB — URINALYSIS, COMPLETE
BACTERIA: NONE SEEN
BLOOD: NEGATIVE
Bilirubin,UR: NEGATIVE
GLUCOSE, UR: NEGATIVE mg/dL (ref 0–75)
Ketone: NEGATIVE
LEUKOCYTE ESTERASE: NEGATIVE
Nitrite: NEGATIVE
Ph: 7 (ref 4.5–8.0)
RBC,UR: 1 /HPF (ref 0–5)
Specific Gravity: 1.011 (ref 1.003–1.030)
Squamous Epithelial: NONE SEEN
WBC UR: 1 /HPF (ref 0–5)

## 2014-02-28 LAB — DRUG SCREEN, URINE
AMPHETAMINES, UR SCREEN: NEGATIVE (ref ?–1000)
BENZODIAZEPINE, UR SCRN: NEGATIVE (ref ?–200)
Barbiturates, Ur Screen: NEGATIVE (ref ?–200)
CANNABINOID 50 NG, UR ~~LOC~~: NEGATIVE (ref ?–50)
COCAINE METABOLITE, UR ~~LOC~~: NEGATIVE (ref ?–300)
MDMA (Ecstasy)Ur Screen: NEGATIVE (ref ?–500)
Methadone, Ur Screen: NEGATIVE (ref ?–300)
OPIATE, UR SCREEN: NEGATIVE (ref ?–300)
Phencyclidine (PCP) Ur S: NEGATIVE (ref ?–25)
Tricyclic, Ur Screen: NEGATIVE (ref ?–1000)

## 2014-02-28 LAB — ETHANOL
Ethanol %: <0.003 % (ref 0.000–0.080)
Ethanol: <3 mg/dL

## 2014-02-28 LAB — TSH: THYROID STIMULATING HORM: 0.7 u[IU]/mL

## 2014-02-28 LAB — ACETAMINOPHEN LEVEL: Acetaminophen: <2 ug/mL

## 2014-02-28 LAB — SALICYLATE LEVEL: SALICYLATES, SERUM: 4 mg/dL — AB

## 2014-03-01 ENCOUNTER — Inpatient Hospital Stay: Payer: Self-pay | Admitting: Psychiatry

## 2014-03-02 LAB — DRUG SCREEN, URINE
Amphetamines, Ur Screen: NEGATIVE (ref ?–1000)
BENZODIAZEPINE, UR SCRN: NEGATIVE (ref ?–200)
Barbiturates, Ur Screen: NEGATIVE (ref ?–200)
Cannabinoid 50 Ng, Ur ~~LOC~~: NEGATIVE (ref ?–50)
Cocaine Metabolite,Ur ~~LOC~~: NEGATIVE (ref ?–300)
MDMA (ECSTASY) UR SCREEN: NEGATIVE (ref ?–500)
Methadone, Ur Screen: NEGATIVE (ref ?–300)
Opiate, Ur Screen: POSITIVE (ref ?–300)
PHENCYCLIDINE (PCP) UR S: NEGATIVE (ref ?–25)
Tricyclic, Ur Screen: NEGATIVE (ref ?–1000)

## 2014-04-03 LAB — COMPREHENSIVE METABOLIC PANEL
ALBUMIN: 3 g/dL — AB (ref 3.4–5.0)
ALT: 43 U/L
ANION GAP: 10 (ref 7–16)
AST: 38 U/L — AB (ref 15–37)
Alkaline Phosphatase: 68 U/L
BUN: 18 mg/dL (ref 7–18)
Bilirubin,Total: 0.3 mg/dL (ref 0.2–1.0)
CALCIUM: 8.4 mg/dL — AB (ref 8.5–10.1)
CREATININE: 1.62 mg/dL — AB (ref 0.60–1.30)
Chloride: 106 mmol/L (ref 98–107)
Co2: 26 mmol/L (ref 21–32)
GFR CALC AF AMER: 57 — AB
GFR CALC NON AF AMER: 47 — AB
GLUCOSE: 120 mg/dL — AB (ref 65–99)
Osmolality: 286 (ref 275–301)
POTASSIUM: 3.5 mmol/L (ref 3.5–5.1)
Sodium: 142 mmol/L (ref 136–145)
Total Protein: 7.3 g/dL (ref 6.4–8.2)

## 2014-04-03 LAB — URINALYSIS, COMPLETE
BILIRUBIN, UR: NEGATIVE
Blood: NEGATIVE
GLUCOSE, UR: NEGATIVE mg/dL (ref 0–75)
Hyaline Cast: 1
Ketone: NEGATIVE
Leukocyte Esterase: NEGATIVE
NITRITE: NEGATIVE
PH: 6 (ref 4.5–8.0)
Protein: >=500
RBC,UR: <1 /HPF (ref 0–5)
Specific Gravity: 1.013 (ref 1.003–1.030)
Squamous Epithelial: NONE SEEN
WBC UR: 1 /HPF (ref 0–5)

## 2014-04-03 LAB — CBC
HCT: 41.8 % (ref 40.0–52.0)
HGB: 14.2 g/dL (ref 13.0–18.0)
MCH: 29.6 pg (ref 26.0–34.0)
MCHC: 33.9 g/dL (ref 32.0–36.0)
MCV: 87 fL (ref 80–100)
PLATELETS: 193 10*3/uL (ref 150–440)
RBC: 4.79 10*6/uL (ref 4.40–5.90)
RDW: 14 % (ref 11.5–14.5)
WBC: 8.6 10*3/uL (ref 3.8–10.6)

## 2014-04-03 LAB — DRUG SCREEN, URINE
AMPHETAMINES, UR SCREEN: NEGATIVE (ref ?–1000)
Barbiturates, Ur Screen: NEGATIVE (ref ?–200)
Benzodiazepine, Ur Scrn: NEGATIVE (ref ?–200)
COCAINE METABOLITE, UR ~~LOC~~: NEGATIVE (ref ?–300)
Cannabinoid 50 Ng, Ur ~~LOC~~: NEGATIVE (ref ?–50)
MDMA (ECSTASY) UR SCREEN: NEGATIVE (ref ?–500)
Methadone, Ur Screen: NEGATIVE (ref ?–300)
OPIATE, UR SCREEN: POSITIVE (ref ?–300)
PHENCYCLIDINE (PCP) UR S: NEGATIVE (ref ?–25)
TRICYCLIC, UR SCREEN: NEGATIVE (ref ?–1000)

## 2014-04-03 LAB — ACETAMINOPHEN LEVEL: Acetaminophen: <2 ug/mL

## 2014-04-03 LAB — ETHANOL

## 2014-04-03 LAB — SALICYLATE LEVEL: Salicylates, Serum: 3.3 mg/dL — ABNORMAL HIGH

## 2014-04-03 LAB — TSH: Thyroid Stimulating Horm: 1.38 u[IU]/mL

## 2014-04-04 ENCOUNTER — Inpatient Hospital Stay: Payer: Self-pay | Admitting: Psychiatry

## 2014-05-10 LAB — COMPREHENSIVE METABOLIC PANEL
ANION GAP: 6 — AB (ref 7–16)
Albumin: 3 g/dL — ABNORMAL LOW (ref 3.4–5.0)
Alkaline Phosphatase: 74 U/L
BUN: 15 mg/dL (ref 7–18)
Bilirubin,Total: 0.5 mg/dL (ref 0.2–1.0)
CHLORIDE: 109 mmol/L — AB (ref 98–107)
Calcium, Total: 8.4 mg/dL — ABNORMAL LOW (ref 8.5–10.1)
Co2: 31 mmol/L (ref 21–32)
Creatinine: 1.72 mg/dL — ABNORMAL HIGH (ref 0.60–1.30)
EGFR (African American): 53 — ABNORMAL LOW
GFR CALC NON AF AMER: 44 — AB
Glucose: 83 mg/dL (ref 65–99)
OSMOLALITY: 291 (ref 275–301)
Potassium: 3.4 mmol/L — ABNORMAL LOW (ref 3.5–5.1)
SGOT(AST): 41 U/L — ABNORMAL HIGH (ref 15–37)
SGPT (ALT): 56 U/L
Sodium: 146 mmol/L — ABNORMAL HIGH (ref 136–145)
TOTAL PROTEIN: 7.3 g/dL (ref 6.4–8.2)

## 2014-05-10 LAB — URINALYSIS, COMPLETE
Bilirubin,UR: NEGATIVE
Blood: NEGATIVE
Glucose,UR: NEGATIVE mg/dL (ref 0–75)
KETONE: NEGATIVE
Leukocyte Esterase: NEGATIVE
Nitrite: NEGATIVE
Ph: 6 (ref 4.5–8.0)
Specific Gravity: 1.014 (ref 1.003–1.030)
Squamous Epithelial: NONE SEEN

## 2014-05-10 LAB — CBC
HCT: 44.9 % (ref 40.0–52.0)
HGB: 15.1 g/dL (ref 13.0–18.0)
MCH: 29.4 pg (ref 26.0–34.0)
MCHC: 33.5 g/dL (ref 32.0–36.0)
MCV: 88 fL (ref 80–100)
Platelet: 180 10*3/uL (ref 150–440)
RBC: 5.12 10*6/uL (ref 4.40–5.90)
RDW: 13.3 % (ref 11.5–14.5)
WBC: 6.8 10*3/uL (ref 3.8–10.6)

## 2014-05-10 LAB — DRUG SCREEN, URINE
Amphetamines, Ur Screen: NEGATIVE (ref ?–1000)
BENZODIAZEPINE, UR SCRN: NEGATIVE (ref ?–200)
Barbiturates, Ur Screen: NEGATIVE (ref ?–200)
COCAINE METABOLITE, UR ~~LOC~~: NEGATIVE (ref ?–300)
Cannabinoid 50 Ng, Ur ~~LOC~~: NEGATIVE (ref ?–50)
MDMA (Ecstasy)Ur Screen: NEGATIVE (ref ?–500)
METHADONE, UR SCREEN: NEGATIVE (ref ?–300)
Opiate, Ur Screen: NEGATIVE (ref ?–300)
PHENCYCLIDINE (PCP) UR S: NEGATIVE (ref ?–25)
TRICYCLIC, UR SCREEN: NEGATIVE (ref ?–1000)

## 2014-05-10 LAB — ETHANOL: Ethanol: <3 mg/dL

## 2014-05-10 LAB — SALICYLATE LEVEL: Salicylates, Serum: 3 mg/dL — ABNORMAL HIGH

## 2014-05-10 LAB — ACETAMINOPHEN LEVEL

## 2014-05-12 ENCOUNTER — Inpatient Hospital Stay: Payer: Self-pay | Admitting: Psychiatry

## 2014-08-22 ENCOUNTER — Emergency Department: Payer: Self-pay | Admitting: Emergency Medicine

## 2014-10-13 ENCOUNTER — Emergency Department: Admit: 2014-10-13 | Disposition: A | Discharge status: Home / Self Care | Payer: Self-pay | Admitting: Student

## 2014-10-19 ENCOUNTER — Inpatient Hospital Stay
Admit: 2014-10-19 | Disposition: A | Discharge status: Home / Self Care | Payer: Self-pay | Attending: Internal Medicine | Admitting: Internal Medicine

## 2014-10-19 LAB — COMPREHENSIVE METABOLIC PANEL
ALK PHOS: 51 U/L
ANION GAP: 9 (ref 7–16)
AST: 29 U/L
Albumin: 3.6 g/dL
BUN: 20 mg/dL
Bilirubin,Total: 1.1 mg/dL
CHLORIDE: 99 mmol/L — AB
Calcium, Total: 8.7 mg/dL — ABNORMAL LOW
Co2: 28 mmol/L
Creatinine: 1.6 mg/dL — ABNORMAL HIGH
EGFR (Non-African Amer.): 47 — ABNORMAL LOW
GFR CALC AF AMER: 55 — AB
Glucose: 138 mg/dL — ABNORMAL HIGH
POTASSIUM: 3.2 mmol/L — AB
SGPT (ALT): 31 U/L
SODIUM: 136 mmol/L
Total Protein: 7.3 g/dL

## 2014-10-19 LAB — URINALYSIS, COMPLETE
BACTERIA: NONE SEEN
BILIRUBIN, UR: NEGATIVE
Blood: NEGATIVE
GLUCOSE, UR: NEGATIVE mg/dL (ref 0–75)
KETONE: NEGATIVE
LEUKOCYTE ESTERASE: NEGATIVE
Nitrite: NEGATIVE
PH: 6 (ref 4.5–8.0)
Protein: >=500
SQUAMOUS EPITHELIAL: NONE SEEN
Specific Gravity: 1.012 (ref 1.003–1.030)

## 2014-10-19 LAB — CBC WITH DIFFERENTIAL/PLATELET
Basophil #: 0 10*3/uL (ref 0.0–0.1)
Basophil %: 0.1 %
Eosinophil #: 0 10*3/uL (ref 0.0–0.7)
Eosinophil %: 0 %
HCT: 44.7 % (ref 40.0–52.0)
HGB: 15.3 g/dL (ref 13.0–18.0)
LYMPHS ABS: 1.5 10*3/uL (ref 1.0–3.6)
Lymphocyte %: 11.4 %
MCH: 28.7 pg (ref 26.0–34.0)
MCHC: 34.3 g/dL (ref 32.0–36.0)
MCV: 84 fL (ref 80–100)
MONOS PCT: 7.2 %
Monocyte #: 1 x10 3/mm (ref 0.2–1.0)
NEUTROS ABS: 11 10*3/uL — AB (ref 1.4–6.5)
Neutrophil %: 81.3 %
Platelet: 169 10*3/uL (ref 150–440)
RBC: 5.33 10*6/uL (ref 4.40–5.90)
RDW: 14.3 % (ref 11.5–14.5)
WBC: 13.5 10*3/uL — AB (ref 3.8–10.6)

## 2014-10-19 LAB — DRUG SCREEN, URINE
Amphetamines, Ur Screen: NEGATIVE
BENZODIAZEPINE, UR SCRN: NEGATIVE
Barbiturates, Ur Screen: NEGATIVE
CANNABINOID 50 NG, UR ~~LOC~~: NEGATIVE
COCAINE METABOLITE, UR ~~LOC~~: NEGATIVE
MDMA (ECSTASY) UR SCREEN: NEGATIVE
Methadone, Ur Screen: NEGATIVE
Opiate, Ur Screen: NEGATIVE
PHENCYCLIDINE (PCP) UR S: NEGATIVE
Tricyclic, Ur Screen: NEGATIVE

## 2014-10-19 LAB — TROPONIN I
TROPONIN-I: 0.05 ng/mL — AB
Troponin-I: 0.06 ng/mL — ABNORMAL HIGH

## 2014-10-19 LAB — LIPASE, BLOOD: LIPASE: 46 U/L

## 2014-10-19 LAB — CK-MB
CK-MB: 6.7 ng/mL — ABNORMAL HIGH
CK-MB: 7.5 ng/mL — ABNORMAL HIGH

## 2014-10-20 LAB — LIPID PANEL
Cholesterol: 158 mg/dL
HDL: 27 mg/dL — AB
Ldl Cholesterol, Calc: 110 mg/dL — ABNORMAL HIGH
Triglycerides: 104 mg/dL
VLDL CHOLESTEROL, CALC: 21 mg/dL

## 2014-10-20 LAB — COMPREHENSIVE METABOLIC PANEL
ALBUMIN: 2.7 g/dL — AB
ALK PHOS: 39 U/L
ALT: 23 U/L
AST: 22 U/L
Anion Gap: 5 — ABNORMAL LOW (ref 7–16)
BILIRUBIN TOTAL: 1.2 mg/dL
BUN: 16 mg/dL
CREATININE: 1.54 mg/dL — AB
Calcium, Total: 7.9 mg/dL — ABNORMAL LOW
Chloride: 101 mmol/L
Co2: 29 mmol/L
EGFR (African American): 58 — ABNORMAL LOW
GFR CALC NON AF AMER: 50 — AB
GLUCOSE: 124 mg/dL — AB
Potassium: 3.2 mmol/L — ABNORMAL LOW
SODIUM: 135 mmol/L
Total Protein: 5.7 g/dL — ABNORMAL LOW

## 2014-10-20 LAB — CBC WITH DIFFERENTIAL/PLATELET
Basophil #: 0 10*3/uL (ref 0.0–0.1)
Basophil %: 0.1 %
EOS ABS: 0 10*3/uL (ref 0.0–0.7)
EOS PCT: 0.2 %
HCT: 36.9 % — ABNORMAL LOW (ref 40.0–52.0)
HGB: 12.6 g/dL — ABNORMAL LOW (ref 13.0–18.0)
LYMPHS ABS: 2.3 10*3/uL (ref 1.0–3.6)
Lymphocyte %: 19.9 %
MCH: 29.2 pg (ref 26.0–34.0)
MCHC: 34.1 g/dL (ref 32.0–36.0)
MCV: 86 fL (ref 80–100)
MONO ABS: 0.9 x10 3/mm (ref 0.2–1.0)
Monocyte %: 7.9 %
NEUTROS PCT: 71.9 %
Neutrophil #: 8.4 10*3/uL — ABNORMAL HIGH (ref 1.4–6.5)
Platelet: 140 10*3/uL — ABNORMAL LOW (ref 150–440)
RBC: 4.3 10*6/uL — AB (ref 4.40–5.90)
RDW: 14.3 % (ref 11.5–14.5)
WBC: 11.6 10*3/uL — ABNORMAL HIGH (ref 3.8–10.6)

## 2014-10-20 LAB — TROPONIN I: Troponin-I: 0.05 ng/mL — ABNORMAL HIGH

## 2014-10-20 LAB — CLOSTRIDIUM DIFFICILE(ARMC)

## 2014-10-20 LAB — MAGNESIUM: Magnesium: 1.5 mg/dL — ABNORMAL LOW

## 2014-10-20 LAB — CK-MB: CK-MB: 5.9 ng/mL — AB

## 2014-10-21 LAB — BASIC METABOLIC PANEL
Anion Gap: 3 — ABNORMAL LOW (ref 7–16)
BUN: 12 mg/dL
CALCIUM: 7.9 mg/dL — AB
CHLORIDE: 107 mmol/L
CREATININE: 1.58 mg/dL — AB
Co2: 27 mmol/L
EGFR (African American): 56 — ABNORMAL LOW
GFR CALC NON AF AMER: 48 — AB
Glucose: 96 mg/dL
POTASSIUM: 3.5 mmol/L
Sodium: 137 mmol/L

## 2014-10-21 LAB — CBC WITH DIFFERENTIAL/PLATELET
BASOS PCT: 0.4 %
Basophil #: 0 10*3/uL (ref 0.0–0.1)
Eosinophil #: 0.1 10*3/uL (ref 0.0–0.7)
Eosinophil %: 1.4 %
HCT: 33.9 % — AB (ref 40.0–52.0)
HGB: 11.4 g/dL — ABNORMAL LOW (ref 13.0–18.0)
Lymphocyte #: 2.1 10*3/uL (ref 1.0–3.6)
Lymphocyte %: 25.4 %
MCH: 28.9 pg (ref 26.0–34.0)
MCHC: 33.5 g/dL (ref 32.0–36.0)
MCV: 86 fL (ref 80–100)
Monocyte #: 0.7 x10 3/mm (ref 0.2–1.0)
Monocyte %: 8.6 %
Neutrophil #: 5.3 10*3/uL (ref 1.4–6.5)
Neutrophil %: 64.2 %
Platelet: 115 10*3/uL — ABNORMAL LOW (ref 150–440)
RBC: 3.92 10*6/uL — AB (ref 4.40–5.90)
RDW: 13.8 % (ref 11.5–14.5)
WBC: 8.3 10*3/uL (ref 3.8–10.6)

## 2014-10-24 LAB — CULTURE, BLOOD (SINGLE)

## 2014-10-24 LAB — WBCS, STOOL

## 2014-10-24 LAB — STOOL CULTURE

## 2014-10-26 LAB — CULTURE, BLOOD (SINGLE)

## 2014-11-01 NOTE — Consult Note (Signed)
PATIENT NAME:  [name], [name] MR#:  [id number] DATE OF BIRTH:  03/12/1956  DATE OF CONSULTATION:  02/28/2014  REFERRING PHYSICIAN:   CONSULTING PHYSICIAN:  Gonzella Lex, MD  IDENTIFYING INFORMATION AND REASON FOR CONSULTATION: A 59 year old man with a history of chronic pain presented to the Emergency Room with a chief complaint of suicidal thinking. Consultation for evaluation of suicidality.   HISTORY OF PRESENT ILLNESS: Information obtained from the patient and the chart. The patient says today he began thinking about "ending it all." He specifically thought about shooting himself in the head. He said this is because his pain is so bad he just cannot stand it anymore. Mood has been down and disgusted and bad for a long time, but his suicidal thinking is more recent. He sleeps poorly at night. He says he sometimes has visual hallucinations of seeing little people walking around. He is not currently getting any psychiatric treatment. He claims that he is still taking all of his prescribed medicine including his pain medicine and has not had any change in that. Denies that he is abusing any drugs or alcohol.   PAST PSYCHIATRIC HISTORY: Claims that he has been prescribed Xanax in the past, but never been treated with an antidepressant. Denies suicide attempts, denies psychiatric hospitalization.   SOCIAL HISTORY: Recently moved here from [place]. Living with his parents, not working. Gets disability.   PAST MEDICAL HISTORY: Chronic pain related to chronic back injury. Also, prostate hypertrophy, also high blood pressure. He takes chronic narcotic pain medicine. It looks like he did recently find a Designer, jewellery in the area to continue it.   SUBSTANCE ABUSE HISTORY: He denies any history of substance abuse. Denies that he abuses his narcotic pain medicine. Denies drinking.   FAMILY HISTORY: Says that he does have a family history of depression and "craziness" in his family, that is his word.     CURRENT MEDICATIONS: Based on last hospitalization, it would be amlodipine 10 mg per day, Flomax 0.4 mg once a day with a meal, MiraLax 17 grams per day. Also appears to be taking chronic OxyContin 30 mg twice a day, as well as plain oxycodone 4 times a day.   ALLERGIES: No known drug allergies.   REVIEW OF SYSTEMS: Depressed mood, suicidal thinking, visual hallucinations. Chronic back and leg pain. No other physical complaints.   MENTAL STATUS EXAMINATION: Adequately groomed gentleman, looks his stated age, cooperative with the interview, but passive. Eye contact intermittent. Psychomotor activity lethargic. Speech quiet and decreased in total amount. Affect is mildly irritable. Mood stated as depressed. Thoughts appear lucid without loosening of associations or delusions. Denies current auditory hallucinations. Endorses visual hallucinations. Endorses suicidal thoughts with plan to shoot himself in the head. Denies homicidal ideation. He could recall 1/3 objects at 3 minutes. He was alert and oriented x 4. Judgment and insight appear adequate. Normal fund of knowledge, normal intelligence.   LABORATORY RESULTS: Chronic anemia with a hematocrit of 37.6, platelet count 146,000. His urinalysis positive for protein. His drug screen is completely negative. TSH normal at 0.7. Alcohol level negative. Chemistry panel: Elevated creatinine 1.48, elevated chloride 108, elevated AST 40, low albumin 2.9.   ASSESSMENT: A 59 year old man who presents with suicidal ideation and symptoms of depression. He has a history of chronic pain with high-dose narcotic usage. His drug screen is currently negative despite the fact that according to the controlled substance database he should have filled a monthly prescription for narcotics just 10 days ago.  My hypothesis is that this gentleman either over took or somehow diverted his medication and is now out of his narcotics, which is why he presented with suicidal thinking. He  denies this, however. In any case, he is sticking to his thoughts about shooting himself and requires hospitalization for safety.   TREATMENT PLAN: Admit to psychiatry. Continue the amlodipine, Flomax, and MiraLax. I am going to give him 20 mg of oxycodone twice a day as the only narcotic for now. This can be reevaluated by the primary treatment team, but I suspect he is probably overusing his narcotics. He seems to be very focused on them. This would be an explanation for why he is suddenly presenting with this intense suicidal ideation.   DIAGNOSIS, PRINCIPAL AND PRIMARY:  AXIS I: Major depression, single, severe.   SECONDARY DIAGNOSES: AXIS I: No further.  AXIS II: No diagnosis.  AXIS III: Chronic pain, high blood pressure, prostate hypertrophy.  AXIS IV: Severe from chronic illness.  AXIS V: Functioning at time of evaluation 37.   ____________________________ Gonzella Lex, MD jtc:at D: 02/28/2014 18:02:10 ET T: 02/28/2014 18:22:44 ET JOB#: [id number]  cc: Gonzella Lex, MD, <Dictator> Gonzella Lex MD ELECTRONICALLY SIGNED 03/21/2014 16:58

## 2014-11-01 NOTE — Consult Note (Signed)
PATIENT NAME:  [name], [name] MR#:  [id number] DATE OF BIRTH:  Apr 03, 1956  DATE OF CONSULTATION:  04/04/2014  REFERRING PHYSICIAN:   CONSULTING PHYSICIAN:  Gonzella Lex, MD  IDENTIFYING INFORMATION AND REASON FOR CONSULTATION: This is a 59 year old man with a history of opiate use and possible abuse and mood instability who comes to the hospital stating, "I'm going to kill someone."   HISTORY OF PRESENT ILLNESS: Information obtained from the patient and the chart. The patient is stating that he is having homicidal ideation and some suicidal ideation. Says he has been having intense intrusive thoughts several days about shooting someone. He says the someone is a particular woman who has been "insinuating" things about him but he will not tell me who it is. He says that he has suicidal thoughts, but only if he were to kill somebody else first. His mood has been angry, hostile, and depressed. He feels hopeless, like he has nothing to live for. He is not sleeping well, feels agitated and anxious. He says that he sees things at times, but he denies any auditory hallucinations. He tells me that he has been taking his medication that he is normally prescribed. He does not report any change in his baseline stress or any new problems that have happened since he was here last time. Denies that he is abusing any drugs other than taking what he is prescribed and denies drinking regularly.   PAST PSYCHIATRIC HISTORY: This gentleman was in the hospital just a few weeks ago. At that time, he was endorsing depressive symptoms. He was diagnosed as having opiate withdrawal. It seems like he had some agitated behavior at that time. He was discharged on narcotics such as he had been prescribed previously, but no new prescriptions were given to him. It does not look like any antidepressants or other psychiatric medicines were started at that time. The patient denies ever trying to kill himself in the past. Denies ever  actually killing anybody else in the past.   PAST MEDICAL HISTORY: He has chronic pain problems. He is prescribed chronic narcotics by [facility]. Also has high blood pressure, often pretty hard to control. He says he has been taking his medicines as prescribed.   SOCIAL HISTORY: Lives with his parents and his brother. Has adult children and does not stay in very close contact with them. Does not work, gets disability. Seems to have a pretty limited social life.   FAMILY HISTORY: He denies any family history of mental illness.   CURRENT MEDICATIONS: At the time of last discharge on 08/24, he was taking oxycodone 5 mg every 8 hours as needed, amlodipine 10 mg once a day, tamsulosin 0.4 mg once a day, OxyContin 30 mg twice a day, clonidine 0.1 mg 3 times a day, furosemide 40 mg once a day, benzocaine topical, lidocaine 5% film to affected area once a day, gabapentin 300 mg 3 times a day, amoxicillin 875 mg twice a day for an acute dental abscess.   ALLERGIES: No known drug allergies.    MENTAL STATUS EXAMINATION: Reasonably well groomed gentleman who looks his stated age, cooperative with the interview. Eye contact intermittent. Psychomotor activity looks a little bit fidgety. Speech is normal in rate, tone, and volume. Affect is irritated. He is not hostile to me, but he seems distressed and uncomfortable. Mood is stated as being depressed. Thoughts do not seem bizarre. No evidence of delusions. Denies auditory or visual hallucinations currently. Talks about having homicidal ideation,  will not tell me who it is towards. Denies suicidal ideation. The patient is alert and oriented x 4. Can recall 3/3 objects immediately, 1 out of 3 at five minutes. Normal intelligence. Fund of knowledge normal.   REVIEW OF SYSTEMS: Chronic pain in his back, reasonably well-controlled. Homicidal and suicidal ideation and depression. No other physical symptoms. The rest of the full 9-point review of systems is negative.    LABORATORY RESULTS: Drug screen positive for opiates. TSH normal at 1.38. Salicylates slightly elevated at 3.3. CBC normal. Alcohol negative. Chemistry panel: Elevated glucose 120, elevated creatinine 1.6, calcium low 8.4. Acetaminophen negative. Urinalysis positive for protein, otherwise unremarkable.   VITAL SIGNS: His current blood pressure is 146/105, respirations 18, pulse 58, temperature 98.2.   The patient is able to ambulate without difficulty. Moves all extremities. Cranial nerves appear intact. No obvious physical distress.   ASSESSMENT: This is a 59 year old male with a history of opiate use and possible misuse. He is coming back to the hospital reporting severely disturbed mood, suicidal and homicidal ideation. He seems kind of different than what he has presented with before. A little hard to understand. Not acting aggressive here in the Emergency Room. At this point, he is cooperating with treatment. Because of his insistence on homicidal and suicidal ideation, he can be admitted to the hospital for restabilization and reassessment.   TREATMENT PLAN: Admit to psychiatry. Close elopement and suicide precautions in place. Continue medicines as prescribed when he was discharged last time. P.r.n. medicine for agitation. Primary team on the unit can make further treatment decisions.   DIAGNOSIS, PRINCIPAL AND PRIMARY:  AXIS I: Depression, not otherwise specified.   SECONDARY DIAGNOSES: AXIS I: Opiate abuse.  AXIS II: Rule out antisocial personality disorder.  AXIS III: Chronic pain, hypertension.    ____________________________ Gonzella Lex, MD jtc:at D: 04/04/2014 14:41:15 ET T: 04/04/2014 15:43:48 ET JOB#: [id number]  cc: Gonzella Lex, MD, <Dictator> Gonzella Lex MD ELECTRONICALLY SIGNED 04/04/2014 23:14

## 2014-11-01 NOTE — Consult Note (Signed)
Brief Consult Note: Diagnosis: possible ileus.   Patient was seen by consultant.   Comments: I reviewed the CT and do not appreciate any abdnormalities. His last BM was 3 days ago, but he continues to pass flatus daily, including just recently. His abdomen is soft, flat, nondistended, and nontender. No evidence of ileus. He may have some underlying constipation, but his CT wasn't c/w with that either, unless mild. Consider chronic Miralax. Will sign off.  Electronic Signatures: Consuela Mimes (MD)  (Signed 25-Jun-15 17:04)  Authored: Brief Consult Note   Last Updated: 25-Jun-15 17:04 by Consuela Mimes (MD)

## 2014-11-01 NOTE — Discharge Summary (Signed)
PATIENT NAME:  [name], [name] MR#:  [id number] DATE OF BIRTH:  1955-10-26  DATE OF ADMISSION:  04/04/2014 DATE OF DISCHARGE:  04/07/2014  IDENTIFYING INFORMATION: The patient is a 59 year old African American male with a history of depression and chronic pain.   CHIEF COMPLAINT: "I want to kill this woman and myself."  DISCHARGE DIAGNOSES:   THIS PATIENT SHOULD NOT BE ADMITTED TO OUR FACILITY AS HE MADE HOMICIDAL THREATHS AGAINTS THIS WRITER  AXIS I: Unspecified depressive disorder, rule out opiate use disorder.   AXIS II: Antisocial personality disorder.   AXIS III: Hypertension, chronic kidney disease, chronic pain.   DISCHARGE MEDICATIONS: Oxycodone 5 mg p.o. every 8 hours as needed for pain, no prescription given, OxyContin 30 mg extended release 1 tablet every 12 hours as needed for pain, no prescription given, gabapentin 300 mg by mouth 3 times a day, amlodipine 10 mg p.o. daily for hypertension, clonidine 0.1 mg 3 times a day for hypertension, Lasix 40 mg p.o. daily for edema and hypertension, fluoxetine 20 mg p.o. at bedtime for irritability, trazodone 100 mg p.o. daily for insomnia.   HISTORY OF PRESENT ILLNESS: The patient presented to the Emergency Department a month after recent discharge to our facility reporting suicidality and homicidality. The patient did not want to disclose who was the person that he wanted to hurt but stated that he was planning on hurting person and then killing himself as he did not want to go to jail. The patient also did not elaborate as to why he was upset other than saying that it was a woman that was spreading rumors against him. During his initial evaluation with the physician on call he reported being upset with this writer who discharged him during his last hospitalization because I had contacted [facility] and had reported that his toxicology screen was negative for opiates even though he had reported taking daily and prior to admission. During  his last hospitalization, the patient became very agitated towards me as a result of our conversation regarding the negative toxic screen and what that can possibly mean. Per the [facility] controlled substance database, the patient continued to attend [facility] and continues to be prescribed with the same dose of OxyContin and oxycodone. The physician on call on the weekend started the patient on fluoxetine and trazodone in order to address irritability and insomnia. Per [facility], the patient has been attending to his appointments and they did not have any evidence of that concern patient has been misusing and has been testing positive  since his discharge from our unit. The patient was observed over the weekend and when I evaluated him on the following Monday, the patient denied having the thoughts of homicidality any longer. He reported feeling better and not having the plan of hurting this person anymore as he was planning on staying away from it. Even though the patient was not disrespectful or agitated with talking with this Probation officer, several staff members reported that the patient will use profanity when referring towards me. It is possible that the patient was making these homicidal threats towards me based on what I am gathering from the information described in the chart. However, the patient never indicated these clearly, only vaguely. However, the patient never indicated these specifically. Prior to team assessment, it was decided to discharge the patient has it was felt that there was no longer a need for him to be in the inpatient unit and he was no longer voicing suicidality  or homicidality. No further medication changes were made. There was no need for seclusion, restraints, or forced medication during this hospitalization. He will be scheduled to follow up with [facility] and will be discharged back to his home.  MENTAL STATUS EXAMINATION: At the time of the discharge, the  patient was alert. He was oriented in person, place, time, and situation. There was no acute distress. Behavior: The patient was calm and cooperative. Eye contact within normal range. Speech: Had a regular tone, volume. Thought content negative for suicidality or homicidality. Associations are linear. No loose associations or flight of ideas. Mood: Mildly irritable, upset, constricted. Insight and judgment are limited. Cognition: Alert and oriented to place, time, and situation. Fund of knowledge appears to be average. Concentration appeared to be average.   LABORATORY RESULTS: BUN 18, creatinine 1.62, which is chronic, calcium 8.4. Alcohol was below detection limit. AST 38, ALT 48. TSH 1.38. Opiate was positive in toxicology screen. The rest of the toxicology screen was negative. WBC 8.6, hemoglobin 14.2, hematocrit 41.8.   DISCHARGE DISPOSITION: The patient will be discharged back to his parent's house in [place], [place].  DISCHARGE FOLLOWUP: The patient will followup withe [facility] in [place], [place], on October 28 at 10:15 a.m.    ____________________________ Hildred Priest, MD ahg:lt D: 04/07/2014 20:38:19 ET T: 04/07/2014 21:07:44 ET JOB#: [id number]  cc: Hildred Priest, MD, <Dictator> Rhodia Albright MD ELECTRONICALLY SIGNED 04/09/2014 12:06

## 2014-11-01 NOTE — Discharge Summary (Signed)
PATIENT NAME:  [name], [name] MR#:  [id number] DATE OF BIRTH:  Dec 21, 1955  DATE OF ADMISSION:  01/02/2014 DATE OF DISCHARGE:  01/06/2014  PRIMARY CARE PHYSICIAN: The patient was given names and numbers of primary care physicians in the area. He will set up an appointment upon discharge.   FINAL DIAGNOSES: 1.  Accelerated hypertension.  2.  Abdominal pain.  3.  Acute on chronic kidney disease.  4.  Back pain and leg pain which is chronic.  5.  Benign prostatic hypertrophy and urinary retention.   DISCHARGE MEDICATIONS: Include amlodipine 10 mg daily, OxyContin 30 mg extended release every 12 hours, Percocet 10/325 one tablet every 4 to 6 hours as needed, clonidine 0.1 mg twice a day, Flomax 0.4 mg once a day after a meal, MiraLax 17 grams once a day.   NOTE: Stop hydrochlorothiazide and lisinopril.   DISCHARGE DIET: Low sodium diet, regular consistency.   DISCHARGE ACTIVITY: As tolerated.   DISCHARGE FOLLOWUP: In 1 to 2 weeks with 1 of the 3 primary care physicians that I gave you names and numbers for.   REASON FOR ADMISSION: The patient was admitted January 02, 2014 and discharged January 06, 2014. The patient was initially admitted with abdominal pain, looked like ileus, possible constipation. The patient also had decreased urine output, and the patient had a Foley catheter in.   DIAGNOSTIC DATA: During the hospital course included a white blood cell count 8.2, H and H 14.5 and 43.6, platelet count 185,000. Glucose 104, BUN 18, creatinine 1.56, sodium 144, potassium 3.6, chloride 109, CO2 28, calcium 8.5. Liver function tests: AST and ALT elevated at 109 and 190. Albumin low at 2.4. Urinalysis: 2+ blood.   CT scan of the abdomen and pelvis showed mildly dilated loops of mid abdomen small bowel. No transition point. May reflect low-grade ileus. Lumbar spondylosis causing right foraminal stenosis at L5-S1.   Creatinine upon discharge 2.01, sodium 135, potassium 3.9, chloride 105, CO2 25,  calcium 8.3.   HOSPITAL COURSE PER PROBLEM LIST:  1.  Accelerated hypertension. The patient's blood pressure was high during the entire hospital course until clonidine was started. Once clonidine was started, I was able to get rid of the hydralazine, lisinopril and hydrochlorothiazide and just go with amlodipine, clonidine and Flomax for the patient's blood pressure. Blood pressure upon discharge 122/76.  2.  Abdominal pain, likely ileus. The patient does take a bunch of pain medications, which could be contributing. Anticonstipation medication with MiraLax upon discharge. The patient had no abdominal pain upon discharge.  3.  Acute on chronic kidney disease, likely from long-standing accelerated hypertension. Monitor creatinine as outpatient. I did hold the lisinopril and the hydrochlorothiazide at this point. I did give some IV fluids and creatinine did peak at 2.08 and now down to 2.01 upon discharge. The urinary retention could be contributing.  4.  Back pain and chronic leg pain. On chronic medications. I did have to write a script for Percocet for him. He does have his OxyContin at home.  5.  BPH and urinary retention. Initially had a Foley catheter. I took that Foley out and started Flomax. He was able to get better urine flow after the Flomax was started.  TIME SPENT ON DISCHARGE: 35 minutes.   ____________________________ Tana Conch. Leslye Peer, MD rjw:sb D: 01/06/2014 15:44:53 ET T: 01/06/2014 17:10:34 ET JOB#: [id number]  cc: Tana Conch. Leslye Peer, MD, <Dictator> Marisue Brooklyn MD ELECTRONICALLY SIGNED 01/09/2014 16:18

## 2014-11-01 NOTE — Consult Note (Signed)
Brief Consult Note: Diagnosis: HTN, renal insufficiency, LE edema, know cardiomyopathy, ongoing tobacco abuse.   Patient was seen by consultant.   Consult note dictated.   Recommend further assessment or treatment.   Orders entered.   Comments: 1. HTN, advance clonidine to 0.1 mg tid and advance lasix to 40 mg po daily doses, follow up with PCP in the next 1 week, have BMP checked as outpatient and BP followed 2.renal insufficiency, likley due to HTN, follow with advanced doses of lasix 3. LE swelling, suspect element of R sided CHF, get sleep study dose as outpt tpo r/o OSA as underlying pathology, advance lasix dose, following CR closely as outpt 4.cardiomyopathy, poss related to long standing HTN, needs to be closely followed by PCP and cardiologist as outpatient 5. ongoing tobacco abuse , councelled for about 4 -5 minutes, advanced to switch to nicotine inhaler or electronic cigarettes, voiced agreement and understanding 6 Tooth ache, ? abscess, cointineu antibiotics, needs urdent dentist appointment Thanks for cosnult, will follow if still in the hospital.  Electronic Signatures: Theodoro Grist (MD)  (Signed 24-Aug-15 12:54)  Authored: Brief Consult Note   Last Updated: 24-Aug-15 12:54 by Theodoro Grist (MD)

## 2014-11-01 NOTE — H&P (Signed)
PATIENT NAME:  Nicholas, Hodge MR#:  [id number] DATE OF BIRTH:  11/28/1955  DATE OF ADMISSION:  04/04/2014  REFERRING PHYSICIAN: Emergency room MD.   ATTENDING PHYSICIAN: Aijalon Kirtz B. Bary Leriche, MD.   IDENTIFYING DATA: [name] is a 59 year old male with history of depression and chronic pain.   CHIEF COMPLAINT: "I wanted to kill this woman and myself."   HISTORY OF PRESENT ILLNESS: [name] was hospitalized at [facility] a month ago for suicidal ideation. He was discharged on no antidepressants. He returns to the hospital exactly 1 month later, stating that he feels homicidal, that he was about to grab a gun and kill a woman. It is unclear who the woman is and the only thing that the patient discloses is the fact that she has been spreading gossip about him or insinuating things. He felt very strong about hurting her. He also knew that should he hurt this other person that he would have to kill himself. He would not go to jail. He came to the hospital asking for help. We had a long discussions about his pain medication. Lack Dr. Jerilee Hoh before, I am under the impression that the patient comes here during the last week of his prescription when he runs out of narcotic pain killers to obtain pain medication. The patient is adamant that he is using his medication as prescribed. He is rather unhappy with Dr. Jerilee Hoh, who reportedly informed his pain clinic that the patient has misusing or selling his medication. He is livid. He went back to another pain clinic, talked to his provider and was prescribed a monthly supply of his pain killers that include oxycodone and OxyContin. Indeed, he is prescribed 10 mg oxycodone and 30 mg OxyContin from the clinic. He reports that he tested positive for proper pain killers and is in good standing at the pain clinic.   He tells me that for the past several months, he has been increasingly irritable and it is very easy to push his buttons.  It gets him in trouble. He is sick and tired of living in [place] and his intention is to return to [place], [place] shortly. He is very much interested in starting an antidepressant. He was certain that he was prescribed Prozac during his last hospitalization, but I see no evidence of it. He tells me that at some point, he did take Prozac and it was helpful. He feels that he cannot continue like that with irritability and poor impulse control because it will get him in trouble. He denies other symptoms of depression. His sleep is interrupted because of pain. There is no change in appetite. He denies crying spells, feelings of guilt, hopelessness, worthlessness, but he feels anxious and he feels on edge all of the time. His thoughts are racing and he frequently thinks of hurting other people or himself. When asked if he belongs in jail rather than in the hospital, he states again that he would never go to jail but rather kill himself. He denies psychotic symptoms. Denies symptoms suggestive of bipolar mania. He denies alcohol, illicit substance or prescription pill abuse.   PAST PSYCHIATRIC HISTORY: He was hospitalized here a month ago in a similar situation. He denies ever attempting suicide. There is no history of violence. He was possibly treated with Prozac at some point. Prozac was also recommended by his primary care provider.   FAMILY PSYCHIATRIC HISTORY: None reported.   PAST MEDICAL HISTORY: Hypertension and chronic back pain.  ALLERGIES: No known drug allergies.   MEDICATIONS ON ADMISSION: Norvasc 10 mg daily, clonidine 0.1 mg 3 times daily, furosemide 40 mg daily, Neurontin 300 mg 3 times daily, oxycodone 10 mg every 8 hours as needed for pain, OxyContin 30 mg twice daily.   SOCIAL HISTORY: He used to live in [place], [place]. He came to visit his parents. He got sick in [place] and was admitted to the hospital in kidney failure and somehow overstayed his welcome. He lives with his  mother and father. He does not like it there and hopes to go back to [place] shortly, where he has fiancee to whom he will be getting married. He has been receiving narcotic pain killers in a pain clinic in [place] for the past 4 years and hopes to return to his previous doctors.   REVIEW OF SYSTEMS:  CONSTITUTIONAL: No fevers or chills. No weight changes.  EYES: No double or blurred vision.  ENT: No hearing loss.  RESPIRATORY: No shortness of breath or cough.  CARDIOVASCULAR: No chest pain or orthopnea.  GASTROINTESTINAL: No abdominal pain, nausea, vomiting or diarrhea.  GENITOURINARY: No incontinence or frequency.  ENDOCRINE: No heat or cold intolerance.  LYMPHATIC: No anemia or easy bruising.  INTEGUMENTARY: No acne or rash.  MUSCULOSKELETAL: No muscle or joint pain.  NEUROLOGIC: No tingling or weakness.  PSYCHIATRIC: See history of present illness for details.   PHYSICAL EXAMINATION: VITAL SIGNS: Blood pressure 147/93, pulse 68, respirations 20, temperature 97.9.  GENERAL: This is a well-developed male in no acute distress.  HEENT: The pupils are equal, round and reactive to light. Sclerae anicteric.  NECK: Supple. No thyromegaly.  LUNGS: Clear to auscultation. No dullness to percussion.  HEART: Regular rhythm and rate. No murmurs, rubs or gallops.  ABDOMEN: Soft, nontender, nondistended. Positive bowel sounds.  MUSCULOSKELETAL: Normal muscle strength in all extremities.  SKIN: No rashes or bruises.  LYMPHATIC: No cervical adenopathy.  NEUROLOGIC: Cranial nerves 2 through 12 are intact.   LABORATORY DATA: Blood glucose 120, creatinine 1.62, blood alcohol level is zero. LFTs are within normal limits except for AST of 38. TSH 1.38. Urine toxicology screen positive for opiates. CBC within normal limits. Urinalysis is not suggestive of urinary tract infection. Serum acetaminophen less than 2. Serum salicylates 3.3.   MENTAL STATUS EXAMINATION ON ADMISSION: The patient is alert and oriented  to person, place, time and situation. He is initially irritable and loud, but calms down during the interview. His anger is directly mostly at Dr. Jerilee Hoh, who he feels accused him unjustly. He is well groomed and casually dressed. He maintains good eye contact. His speech is loud at times. Mood is depressed with irritable affect. Thought process is logical and goal oriented. Thought content: He still endorses thoughts of hurting a woman and then himself. There are no delusions or paranoia. There are no auditory or visual hallucinations. His cognition is grossly intact. Registration, recall and long-term memory are intact. He is of average intelligence and fund of knowledge. His insight and judgment are questionable.   SUICIDE RISK ASSESSMENT ON ADMISSION: This is a patient with a history of untreated depression and chronic pain, who comes to the hospital complaining of homicidal and suicidal ideation.   INITIAL DIAGNOSES:   AXIS I:  1. Major depressive disorder, recurrent, severe.  2. Opiate dependence.   AXIS II: Deferred.   AXIS III:  1. Hypertension.  2. Chronic kidney disease.  3. Chronic pain.   PLAN: The patient was admitted to  [facility] for safety, stabilization and medication management. He was initially placed on suicide precautions and was closely monitored for any unsafe behaviors. He underwent full psychiatric and risk assessment. He received pharmacotherapy, individual and group psychotherapy, substance abuse counseling and support from therapeutic milieu.  1. Suicidal and homicidal ideations. Still feels homicidal towards the woman. She is not in the hospital. He is able to contract for safety.  2. Mood: We will start Prozac 20 mg at bedtime.  3. We will continue clonidine, furosemide, amlodipine for high blood pressure.  4. Pain. We will continue Neurontin, OxyContin and oxycodone for pain. I will not increase oxycodone to 10 mg.  He does not seem to be in pain. We requested report from the pharmacy.  5. Disposition: I guess he will be discharged back to his parents and follow up with [facility]. I am not certain if he was seen in follow up since discharge 1 month ago. He does not have managed Medicare, so it is not easy to find a provider in the area.    ____________________________ Herma Ard B. Bary Leriche, MD jbp:TT D: 04/05/2014 17:51:33 ET T: 04/05/2014 19:38:36 ET JOB#: [id number]  cc: Paighton Godette B. Bary Leriche, MD, <Dictator> Clovis Fredrickson MD ELECTRONICALLY SIGNED 04/07/2014 23:30

## 2014-11-01 NOTE — Consult Note (Signed)
PATIENT NAME:  [name], [name] MR#:  [id number] DATE OF BIRTH:  1955/10/05  DATE OF CONSULTATION:  05/11/2014  REFERRING PHYSICIAN:   CONSULTING PHYSICIAN:  Islay Polanco K. Franchot Mimes, MD  PLACE OF DICTATION: [facility] Emergency Room, bed [place], [place], [place].    AGE:  59 years.  SEX:  Male.  RACE:  African American.  SUBJECTIVE:  The patient was seen in consultation in the [facility] Emergency Room, bed BHU5. The patient is a 59 year old African-American male not employed and being divorced for many years and lives with his father who is in his 77s.  The patient comes to [facility] Emergency Room with a chief complaint "having homicidal ideas towards that guy who keeps bothering me."   HISTORY OF PRESENT ILLNESS:  The patient reports that this guy has been bothering him and he had been cheating and lying on him which makes him have homicidal ideas towards him and he realized that he needed help and came here.   PAST PSYCHIATRIC HISTORY: The patient was an inpatient in psychiatry at [facility] behavioral health and he was here in September 25 to the 28th and was discharged with a diagnosis of depression.  The patient reports that he is compliant with medication and is being followed in [place] at [facility] by Dr. Kasandra Knudsen. His next appointment is coming up soon in November.    ALCOHOL AND DRUGS: No problems relating to the same.  MENTAL STATUS EXAMINATION: The patient is alert and oriented to place, person and time.  Affect is sad.  Mood restricted.  Admits to feeling very depressed and admits feeling hopeless and helpless.  He admits feeling worthless and useless about what is going on his life.  Denies any suicidal ideas but does have homicidal ideas and wants to hurt the guy who has been cheating on him, lying on him and he would not release the name; however, he later stated during the interview that he will not actually act on this, but he wants to control these ideas.  Denies auditory or visual  hallucinations, but however he reports that he feels paranoid about several people around him and does not trust many people around him.  Insight and judgment guarded.  Impulse control poor.    IMPRESSION: Major depressive disorder with psychosis.    Recommend inpatient hospitalization on psychiatry for close observation.  He will be started back on all of his medications which will be adjusted and the floor physician will evaluate for the same.    ____________________________ Wallace Cullens. Franchot Mimes, MD skc:jw D: 05/11/2014 14:01:43 ET T: 05/11/2014 19:33:32 ET JOB#: [id number]  cc: Arlyn Leak K. Franchot Mimes, MD, <Dictator> Dewain Penning MD ELECTRONICALLY SIGNED 05/12/2014 8:31

## 2014-11-01 NOTE — H&P (Signed)
PATIENT NAME:  [name], [name] MR#:  [id number] DATE OF BIRTH:  01-Dec-1955  DATE OF ADMISSION:  01/02/2014  PRIMARY CARE PHYSICIAN: None.   REFERRING PHYSICIAN: Dr. Conni Slipper  CHIEF COMPLAINT:  Abdominal pain.   HISTORY OF PRESENT ILLNESS: This patient is a 59 year old male with history of hypertension who has moved from [place] about 5 days back. Started to experience pain in the suprapubic area on the left side for the last 3-4 days. The patient also states that could not urinate in the last few days. In the Emergency Department, Foley catheter was placed. Shows mild renal insufficiency; however, we do not have the patient's baseline. The patient denies having any vomiting.  Has somewhat mildly decreased appetite. Last bowel movement was 2 days back. The patient could not tell if he is passing any flatus. Workup in the Emergency Department with CT abdomen and pelvis shows mildly dilated loops of mid-abdominal small bowel.  No well-defined transition point.  The patient states that usually he is constipated. The patient is on Percocet for chronic back pain.   PAST MEDICAL HISTORY:  1. Chronic back pain.  2. Hypertension.   PAST SURGICAL HISTORY: None.   ALLERGIES: No known drug allergies.   HOME MEDICATIONS:  1. Tussionex 5 mL 2 times a day as needed.  2. Percocet 5/325 mg every 4-6 hours as needed.  3. Lisinopril 40 mg once a day.  4. Hydrochlorothiazide 25 mg once a day.  5. Hydralazine 25 mg once a day.  6. Coreg 3.125 mg 2 times a day.  7. Amlodipine 10 mg once a day.   SOCIAL HISTORY: Continues to smoke 2-3 cigarettes a day. Denies drinking alcohol or using illicit drugs. Currently lives with his parents.  Currently not working. Patient just moved from [place].   FAMILY HISTORY: Both parents are healthy.   REVIEW OF SYSTEMS:  CONSTITUTIONAL: Generalized weakness.  EYES: No change in vision.  EARS, NOSE, AND THROAT:  No change in hearing.  RESPIRATORY:  Has mild cough. No  shortness of breath.  CARDIOVASCULAR: No chest pain, palpations. GASTROINTESTINAL:   Has mild nausea. No vomiting.  Abdominal pain.  GENITOURINARY:  Has decreased urine output; however, Foley catheter is in place draining about 250 mL of urine.  ENDOCRINE: No polyuria or polydipsia.  HEMATOLOGIC: No easy bruising or bleeding.  MUSCULOSKELETAL: No joint pains and aches.  NEUROLOGICAL:  No weakness or numbness in any part of the body.   PHYSICAL EXAMINATION:  GENERAL: This is a well built, well nourished, age-appropriate male lying down in the bed, not in distress.  VITAL SIGNS: Temperature 98.4, pulse 98, blood pressure 170/127, respiratory rate of 18, oxygen saturation is 97% on room air.  HEENT: Head normocephalic, atraumatic. There is no scleral icterus. Conjunctivae normal. Pupils equal and reactive. Extraocular movements are intact. Mucous membranes: Mild dryness.  Marland Kitchen  NECK: Supple. No lymphadenopathy. No JVD. No carotid bruit. No thyromegaly.  CHEST: Has no focal tenderness.  LUNGS:  Bilaterally clear to auscultation.  HEART: S1, S2 regular. No murmurs are heard.  tachycardia.  EXTREMITIES: No pedal edema. Pulses 2+.  ABDOMEN: Bowel sounds positive. Soft. Has tenderness in the right side of the abdomen. No rebound or guarding.  I could not appreciate any hepatosplenomegaly.  NEUROLOGICAL:  The patient is alert, oriented to place, person, and time. Cranial nerves II through XII intact. Motor 5/5 in upper and lower extremities.   LABORATORY DATA: CT abdomen and pelvis as mentioned above shows mildly dilated  loops in the mid-abdominal small bowel with no defined transition point, atherosclerosis, and chronice diverticulosis.  CBC is completely within normal limits.   CMP:  The creatinine of 1.56.  Rest of all the values are within normal limits.   UA negative for nitrites and leukocyte esterase.   ASSESSMENT AND PLAN: This patient is a 59 year old male who comes to the Emergency  Department with abdominal pain with the concern of possible mild partial small bowel obstruction.  1. Abdominal pain. Did not comment about if patient had any constipation. Shows mild ileus versus bowel obstruction. The patient is also on narcotics. No electrolyte imbalances. We will keep the patient on n.p.o., except for medications.  Continue with MiraLax. Highly concerning about patient might be having constipation. Has good bowel sounds in all 4 quadrants.  2. Decreased urine output. The patient has renal insufficiency; however, patient has some urine in the bag.  Has a Foley catheter in place.  3. Hypertension, poorly controlled. Continue with the home medications.  4. Keep the patient on deep vein thrombosis prophylaxis with Lovenox.   TIME SPENT: 55 minutes.    ____________________________ Monica Becton, MD pv:dd D: 01/02/2014 01:11:04 ET T: 01/02/2014 05:04:07 ET JOB#: [id number]  cc: Monica Becton, MD, <Dictator> Grier Mitts VASIREDDY MD ELECTRONICALLY SIGNED 01/02/2014 21:02

## 2014-11-01 NOTE — H&P (Signed)
PATIENT NAME:  [name], [name] MR#:  [id number] DATE OF BIRTH:  [birth year]  DATE OF ADMISSION:  05/12/2014  REFERRING PHYSICIAN: Emergency Room MD  ATTENDING PHYSICIAN: Darienne Belleau B. Bary Leriche, MD  IDENTIFYING DATA: [name] is a 59 year old male with history of depression and chronic pain.   CHIEF COMPLAINT: "I was begging them to let me go."   HISTORY OF PRESENT ILLNESS: [name] came to the Emergency Room 2 days prior. He at the time disclosed worsening of depression and auditory command hallucinations telling him to kill some guy. The patient was not specific and did not want to tell us who is person he wants to kill; he only said that this person has been annoying spreading lies about him and the patient rather upset. He spent 2 days in the emergency room. By the time he comes to the unit now, he is much more composed. He denies feeling suicidal or homicidal. He was also restarted on pain medication in the Emergency Room which always is a problem. This is 3rd hospitalization for [name] in the past 3 months. It appears that he comes to the hospital suicidal or homicidal when he runs out of his prescribe pain medication. He goes to [facility]; last admission Dr. Jerilee Hoh did call the clinic to tell them that when he came to the Emergency Room, he was negative for opiates. It is likely that her phone call resulted in cutting back his pain medications. When I called his pharmacy, we have learned that OxyContin at 30 mg twice daily was prescribed on October 14, but last prescription for oxycodone was in September. The patient is telling me that this is not an issue of pain medication that he has plenty at home and the reason he did not buy oxycodone is that he has not been using much and has some leftover pills. He indeed feels much better now. He denies any symptoms of depression. He denies auditory hallucinations. He denies thoughts of hurting himself or others. His pain is adequately  controlled. He feels that he needs to be going back home so he can do what he is called to do. 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 I am not certain if this is out of ordinary for this patient to warrant suspicion of the bipolar mania. The patient reports no new stressors. He still lives with his parents, he receives disability; he has a fiancee in [place]; that he wants to marry but apparently not yet. He denies alcohol or illicit substance use. He adamantly denies prescription pill abuse or misuse.   PAST PSYCHIATRIC HISTORY: This is a 3rd hospitalization here. There were no suicide attempts. No history of violence. He was started on Prozac and did follow up with [facility].   FAMILY PSYCHIATRIC HISTORY: None reported.   PAST MEDICAL HISTORY: Hypertension, chronic back pain.   ALLERGIES: TYLENOL.   MEDICATIONS ON ADMISSION: Norvasc 10 mg daily, clonidine 0.1 mg 3 times daily, furosemide 40 mg daily, Neurontin 300 mg 3 times daily, OxyContin 30 mg twice daily, Prozac 20 mg daily.   SOCIAL HISTORY: He is from [place], [place]. He came to visit his parents and stayed as he got sick here. He had kidney failure. He lives with his mom and dad. He wants to go back to [place], but somehow it has been delayed. He goes to [facility]. He has been in the pain clinic in [place], as well  for the past 4 years.   REVIEW OF SYSTEMS:  CONSTITUTIONAL: No fevers or chills. No weight changes.  EYES: No double or blurred vision.  ENT: No hearing loss.  RESPIRATORY: No shortness of breath or cough.  CARDIOVASCULAR: No chest pain or orthopnea.  GASTROINTESTINAL: No abdominal pain, nausea, vomiting, or diarrhea.  GENITOURINARY: No incontinence or frequency.  ENDOCRINE: No heat or cold intolerance.  LYMPHATIC: No anemia or easy bruising.  INTEGUMENTARY: No acne or rash.  MUSCULOSKELETAL: No muscle or joint pain.  NEUROLOGIC: No tingling or weakness.  PSYCHIATRIC:  See history of present illness for details.   PHYSICAL EXAMINATION: VITAL SIGNS: Blood pressure 154/96, pulse 68, respirations 18, temperature 98.2.  GENERAL: This is an obese, middle-aged male in no acute distress.  HEENT: The pupils are equal, round, reactive to light, sclerae are anicteric.  NECK: Supple. No thyromegaly.  LUNGS: Clear to auscultation. No dullness to percussion.  HEART: Regular rhythm and rate. No murmurs, rubs, or gallops.  ABDOMEN: Soft, nontender, nondistended. Positive bowel sounds.  MUSCULOSKELETAL: Normal muscle strength in all extremities.  SKIN: No rashes or bruises.  LYMPHATIC: No cervical adenopathy.  NEUROLOGIC: Cranial nerves II through XII are intact.   LABORATORY DATA: Chemistries: Blood glucose 83, BUN 15, creatinine 1.72, sodium 146, potassium 3.4; blood alcohol level is zero, LFTs within normal limits except for AST of 41. Urine tox screen negative for substances including opiates; CBC within normal limits; urinalysis is not suggestive of urinary tract infection, serum acetaminophen less than 2, serum salicylates 3.   MENTAL STATUS EXAMINATION ON ADMISSION: The patient is alert and oriented to person, place, time and situation. He is pleasant, polite and cooperative; he is well-groomed and casually dressed. He maintains good eye contact. He recognizes me from previous admission. His speech is of normal rhythm, rate and volume, rather soft. Mood is fine with full affect. Thought process is logical and goal oriented. Thought content: He denies thoughts of hurting himself or others. There are no delusions or paranoia. Auditory command hallucinations have resolved. His cognition is grossly intact. Registration, recall, long and short-term memory are intact. He is of average intelligence and fund of knowledge. His insight and judgment are questionable.   SUICIDE RISK ASSESSMENT ON ADMISSION: This is a patient with a history of depression and chronic pain who came to  the hospital complaining of auditory command hallucinations telling him to hurt someone, this has resolved.   INITIAL DIAGNOSES:   AXIS I: Major depressive disorder, recurrent, severe with psychotic features, opioid dependence.   AXIS II: Deferred.   AXIS III: Hypertension, chronic pain and chronic kidney disease.   PLAN: The patient was admitted to [place] unit for safety, studies and medication management. He was initially placed on suicide precautions and was closely monitored for any unsafe behaviors. He underwent full psychiatric and risk assessment. He received pharmacotherapy, individual and group psychotherapy, substance abuse counseling, and support from therapeutic milieu.  1.  Homicidal ideation: This has resolved. He is able to contract for safety.  2.  Mood: We will continue Prozac 40 mg daily.  3.  Medical: We will continue clonidine, furosemide; amlodipine for high blood pressure.  4.  Pain: We will give oxycodone, but not OxyContin, we will continue Neurontin.  5.  Disposition: He will be discharged to home with his parents. He will follow up with [facility] and [facility] for pain management.    ____________________________ Wardell Honour. Bary Leriche, MD jbp:nt D: 05/12/2014 14:43:00  ET T: 05/12/2014 15:13:04 ET JOB#: [id number]  cc: Welton Bord B. Bary Leriche, MD, <Dictator> Clovis Fredrickson MD ELECTRONICALLY SIGNED 05/19/2014 6:24

## 2014-11-01 NOTE — Consult Note (Signed)
PATIENT NAME:  [name], [name] MR#:  [id number] DATE OF BIRTH:  1956-04-20  DATE OF CONSULTATION:  03/03/2014  REFERRING PHYSICIAN:  Hildred Priest, MD    CONSULTING PHYSICIAN:  Nicholas Grist, MD  REASON FOR CONSULTATION: Consult was requested in regard to hypertension management by Dr. Hildred Hodge.    HISTORY OF PRESENT ILLNESS: The patient is a 59 year old African American male with past medical history significant for history of chronic back pain who presented to the hospital with major depression episode and was admitted by behavioral medicine on 02/28/2014. He was noted to have elevated blood pressure and hospitalist services were contacted for consultation in regard to management of his blood pressure. His blood pressure has been running high and apparently on admission his blood pressure was around 150. It was fluctuating between 140s to 150s and today it was 146/115. Because of his elevated blood pressure, hospitalist services was recommended to see the patient. The patient himself is not complaining of any significant discomfort except for his chronic back pains. He admits of having lower back pains with disc dislocation. He admits of having some snoring as well as sleepiness at daytime. Admits of feeling presyncopal, and admits of having some palpitations. He tells me that he has a history of cardiomyopathy and would like to have his blood pressure well controlled in order to improve his cardiac function.  PAST MEDICAL HISTORY: Significant for history of chronic back pain, history of BPH, hypertension, large heart, as well as kidney cyst.  MEDICATIONS: Per medical records, the patient is on Tylenol 325 mg 1 tablet every 4 hours as needed, Norvasc 10 mg p.o. daily, Benadryl 25 mg every 4 hours as needed, magnesium hydroxide suspension 30 mL at bedtime as needed, nicotine oral inhaler 1 cartridge every 4 hours as needed, oxycodone extended release 30 mg twice daily,  MiraLax powder 70 mg daily as needed, tamsulosin which is Flomax 0.1 mg p.o. daily, amoxicillin 875 mg twice daily, Benzocaine [id number] gel 1 application 3 times daily as needed, gabapentin 300 mg p.o. 3 times daily, ibuprofen 800 mg every 8 hours, lidocaine patch 5% once daily, oxycodone 5 mg every 8 hours as needed, clonidine 0.1 mg twice daily, and furosemide 20 mg p.o. daily.   PAST SURGICAL HISTORY: None.   ALLERGIES: THE PATIENT WAS TOLD NOT TO TAKE TYLENOL.   FAMILY HISTORY: No early coronary artery disease or strokes. No diabetes or cancers. Multiple family members with hypertension.   SOCIAL HISTORY: The patient smokes approximately 1/3 of a pack a day for 10 years. He has a fiancee who he is expecting to marry. He denies any alcohol abuse and he is on disability.   REVIEW OF SYSTEMS:  CONSTITUTIONAL: Positive for low back pains and glasses he needs to read, some snoring as well as sleepiness at daytime, poor sleep at nighttime, feeling presyncopal intermittently, also intermittent heart palpitations and intermittent dysuria. Denies any fevers, chills, fatigue, weakness, weight loss or gain.  EYES: Denies blurry vision, double vision, glaucoma or cataracts.  EARS, NOSE, AND THROAT: Denies any tinnitus, allergies, epistaxis, sinus pain, dentures, difficulty swallowing.  RESPIRATORY: Denies any wheezes, asthma, COPD. CARDIOVASCULAR: Denies any chest pains, orthopnea, edema, arrhythmias.  GASTROINTESTINAL: Denies any nausea, vomiting, diarrhea or constipation.  GENITOURINARY: Denies any hematuria, frequency or incontinence.  ENDOCRINOLOGY: Denies any polydipsia, nocturia, thyroid problems, heat or cold intolerance or thirst.  HEMATOLOGIC: Denies anemia, easy bruising or bleeding, swollen glands. SKIN: Denies any acne, rashes, changes in moles.  MUSCULOSKELETAL: Denies  arthritis, cramps, swelling, gout.  NEUROLOGIC: No numbness, epilepsy or tremor.  PSYCHIATRIC: Denies anxiety, insomnia,  depression.  PHYSICAL EXAMINATION: VITAL SIGNS: On my evaluation, temperature was 97.9, pulse was 67, respiratory rate was 20, blood pressure 164/115, O2 saturations were checked on 03/01/2014 and was 97% on room air.  GENERAL: This is a well-developed, well-nourished, mildly obese African American male in no significant distress, lying on the stretcher and walking around with no significant distress.  HEENT: His pupils are equal, reactive to light. Extraocular muscles intact, no icterus or conjunctivitis. Has normal hearing. No pharyngeal erythema. Mucosa is moist.  NECK: No masses. Supple, nontender. Thyroid is not enlarged. No adenopathy. No JVD or carotid bruits bilaterally. Full range of motion.  LUNGS: Clear to auscultation, though diminished breath sounds. No rales, rhonchi or wheezing. No labored inspirations, increased effort, dullness to percussion or overt respiratory distress.  CARDIOVASCULAR: S1 and S2 appreciated. Rhythm is regular. PMI not lateralized. Chest is nontender to palpation. Pedal pulses 1+. Lower extremity edema 2 to 3+. No calf tenderness or cyanosis was noted. ABDOMEN: Soft, nontender. Bowel sounds are present. No hepatosplenomegaly or masses were noted. RECTAL: Deferred. MUSCLE STRENGTH: Able to move all extremities. No cyanosis, degenerative joint disease or kyphosis. Gait is steady.  SKIN: Did not reveal any rashes, lesions, erythema, nodularity. It was indurated in lower extremities. Skin was otherwise warm and dry to palpation.  LYMPHATIC: No adenopathy in the cervical region.  NEUROLOGICAL: Cranial nerves grossly intact. Sensory is intact. No dysarthria or aphasia. The patient is alert, oriented to time, person and place, cooperative. Memory is good.  PSYCHIATRIC: No significant confusion, agitation or depression noted at present.   LABORATORY DATA: Done 02/28/2014: Creatinine of 1.48; otherwise, BMP was unremarkable. The patient's alcohol level was less than  0.003%. Liver enzymes: Albumin level of 2.9, AST elevated at 40, otherwise unremarkable study. TSH was normal at 0.7. Urine drug screen initially was unremarkable. Repeat urine drug screen 03/02/2014 was positive for opiates. CBC: White blood cell count was 6.1, hemoglobin was 14.2, platelet count 179,000. Urinalysis was unremarkable. Tylenol level was less than 2 and salicylate level was elevated at 4.0.   RADIOLOGIC STUDIES: None. EKG is not done.   ASSESSMENT AND PLAN:  1.  Hypertension. Advance the patient's clonidine to 0.1 mg 3 times daily and advance Lasix to 40 mg p.o. daily dose. Follow with primary care physician in the next 1 week, have BMP checked and blood pressure followed as outpatient.  2.  Renal insufficiency, likely due to long-standing hypertension. Follow with advanced doses of Lasix and improve the control of blood pressure. 3.  Lower extremity swelling, suspect element of right-sided heart failure. Questionable obstructive sleep apnea. Will need to have sleep study done as an outpatient to rule out obstructive sleep apnea as underlying pathology. For now we will be  following his creatinine levels closely.  4.  Cardiomyopathy, possibly related to the same long-standing hypertension. He is to be followed closely by primary care physician as well as possibly cardiologist.  5.  Ongoing tobacco abuse. Counseled for approximately 45 minutes. Advance decision to continue inhaler or electronic cigarettes. He voiced agreement as well as understanding. 6.  Toothache, questionable abscess formation. Continue antibiotics per psychiatry. The patient will need to have urgent dentist appointment.  Thank you for the consult, we will follow the patient as he remains in the hospital.  TIME SPENT: Fifty minutes.    ____________________________ Nicholas Grist, MD rv:TT D: 03/03/2014 13:03:30 ET T:  03/03/2014 15:23:32 ET JOB#: [id number]  cc: Nicholas Grist, MD, <Dictator> Nicholas Priest, MD  Ruso MD ELECTRONICALLY SIGNED 04/07/2014 18:43

## 2014-11-01 NOTE — Discharge Summary (Signed)
PATIENT NAME:  [name], CATON MR#:  [id number] DATE OF BIRTH:  [birth year]  DATE OF ADMISSION:  03/01/2014 DATE OF DISCHARGE:  03/03/2014  REASON FOR ADMISSION: Suicidal ideation.   DISCHARGE DIAGNOSES:  AXIS I: Opiate withdrawal, opioid use disorder versus opiate diversion, tobacco use disorder. AXIS II: Deferred.  AXIS III: Hypertension, chronic kidney deficiency, chronic back pain, benign prostatic hypertrophy, hypertensive cardiomyopathy, tooth abscess.  AXIS IV: Unstable living situation.  AXIS V: Global assessment of functioning of 45.   DISCHARGE MEDICATIONS: Amoxicillin. 875 mg p.o. q. 12 hours for dental abscess, gabapentin 300 mg p.o. daily for back pain, Lidoderm patch apply 1 patch to affected area daily, Orajel apply orally as needed for dental pain, Lasix 40 mg p.o. daily for edema hypertension, clonidine 0.1 mg by mouth 3 times a day, OxyContin ER 30 mg every 12 hours, OxyContin 5 mg every 8 hours as needed for pain, Norvasc 10 mg p.o. daily for hypertension, and Flomax 0.4 mg daily for BPH.   HOSPITAL COURSE: The patient is a 59 year old African American male with a history of chronic pain, who presented to the Emergency Room with chief complaint of having suicidal thoughts. The patient reported that he was thinking about ending it all because his pain was so severe and thought about shooting himself in the head. The patient states that he sees a pain doctor, but it is not helping. He reported being compliant with his pain regimen and denied the use of alcohol or illicit substances. The patient, on evaluation in the Emergency Department, was noted the to have a negative toxicology screen for opiates, even though the patient had been taking OxyContin immediate release and OxyContin ER daily, which were prescribed by a nurse practitioner from [facility]. There were concerns at the time of admission about the patient diverting his medications, however, as he reported suicidal ideation  he was admitted to the behavioral health unit. On admission the patient reported only vague psychiatric symptoms. When questioned about suicidality, his answer was "a little bit." The patient was vague during questioning and was not interested in starting medications or any treatment for depression. During his hospitalization, the patient's major issue and concern was chronic back pain. He was continued on his home medications; however, the immediate release oxycodone was decreased from 10 mg every 8 hours to only 5 mg every 8 hours. He was started on gabapentin 300 mg 2 times a day and Lidoderm patch was ordered. The patient also reported having poorly controlled hypertension despite of being recently hospitalized in our facility for hypertension. Hospitalist was consulted and they recommended to have the patient increase his clonidine from 0.1 mg twice a day to 0.1 mg 3 times a day. They also recommended to increase the Lasix from 20 mg to 40 mg a day for edema. The patient also complained of dental pain. At examination, it appeared that the patient had a dental abscess. He was started on amoxicillin 875 mg twice a day and was given Orajel for the pain. As staff and treatment team felt that the patient's issues were not psychiatric but mainly related to chronic pain, it was determined to discharge the patient from our facility and have him follow up outpatient. On the day of the discharge, the patient was told that his toxicology screen was negative at admission. It was explained to him that this will raise a concern for possible diversion. The patient denied at all times selling his medications, and he actually stated that  he had taken more medications than prescribed because his pain was poorly controlled. The patient was calm during the assessment. However, a couple of hours later the patient became demanding, loud and agitated, demanding to see all his laboratory results. He accused the Probation officer of lying to him  about his results. The patient was shown his laboratory results, and even then the patient continued to demand to see the laboratory tests that will show the drugs in his blood. I explained to him multiple times that no such test was checked, but the patient continued to insist that we were withholding information from him. [facility] was contacted and they were informed that the patient had shown up with a urine toxicology screen that was negative.  The patient was discharged from our facility with only prescriptions for the medications that were started during his admission. No prescriptions for OxyContin or oxycodone were written.   MENTAL STATUS EXAMINATION AT THE TIME OF THE DISCHARGE: The patient was alert. He was oriented in person, place, time and situation. Psychomotor activity was mildly agitated. Eye contact was within normal range. His speech had an increased tone and volume, slightly increased rate. Thought process is focused on pain medications, perseverates on wanting to see his blood tests. Thought content is negative for suicidality or homicidality. Perception was negative for psychosis. Mood is irritable. Affect congruent. Insight and judgment are poor.   LABORATORY RESULTS: BUN 18, creatinine 1.48, sodium 143, potassium 3.9. Alcohol was below detection limit. AST 40, ALT 43, TSH 0.70. Urine toxicology screen was negative at admission for opiates. On August 23, once the patient was given opiates, the toxicology screen was checked again and it showed positive for opiates. WBC 6.1, hemoglobin 14.2, hematocrit 42.4, platelets 179,000. UA is negative for infection, blood or protein. Acetaminophen level was nondetectable and salicylate level was increased to 4.  DISCHARGE DISPOSITION: The patient will return to his parent's house in [place].   DISCHARGE FOLLOWUP: The patient has an appointment with [facility] on August 27.   ____________________________ Hildred Priest, MD ahg:TT D: 03/04/2014 16:25:50 ET T: 03/04/2014 23:06:30 ET JOB#: [id number]  cc: Hildred Priest, MD, <Dictator> Rhodia Albright MD ELECTRONICALLY SIGNED 03/07/2014 13:42

## 2014-11-03 ENCOUNTER — Emergency Department
Admit: 2014-11-03 | Disposition: A | Discharge status: Home / Self Care | Payer: Self-pay | Admitting: Emergency Medicine

## 2014-11-03 LAB — COMPREHENSIVE METABOLIC PANEL
ANION GAP: 6 — AB (ref 7–16)
AST: 42 U/L — AB
Albumin: 3.4 g/dL — ABNORMAL LOW
Alkaline Phosphatase: 51 U/L
BUN: 18 mg/dL
Bilirubin,Total: 0.5 mg/dL
CALCIUM: 9.1 mg/dL
Chloride: 100 mmol/L — ABNORMAL LOW
Co2: 31 mmol/L
Creatinine: 1.82 mg/dL — ABNORMAL HIGH
EGFR (Non-African Amer.): 41 — ABNORMAL LOW
GFR CALC AF AMER: 47 — AB
GLUCOSE: 109 mg/dL — AB
Potassium: 3.7 mmol/L
SGPT (ALT): 39 U/L
SODIUM: 137 mmol/L
Total Protein: 7.2 g/dL

## 2014-11-03 LAB — CBC WITH DIFFERENTIAL/PLATELET
Basophil #: 0 10*3/uL (ref 0.0–0.1)
Basophil %: 0.5 %
EOS PCT: 3.1 %
Eosinophil #: 0.2 10*3/uL (ref 0.0–0.7)
HCT: 39.2 % — ABNORMAL LOW (ref 40.0–52.0)
HGB: 13.4 g/dL (ref 13.0–18.0)
LYMPHS PCT: 43.7 %
Lymphocyte #: 3.1 10*3/uL (ref 1.0–3.6)
MCH: 29.1 pg (ref 26.0–34.0)
MCHC: 34.2 g/dL (ref 32.0–36.0)
MCV: 85 fL (ref 80–100)
MONO ABS: 0.6 x10 3/mm (ref 0.2–1.0)
MONOS PCT: 7.8 %
NEUTROS PCT: 44.9 %
Neutrophil #: 3.2 10*3/uL (ref 1.4–6.5)
Platelet: 181 10*3/uL (ref 150–440)
RBC: 4.61 10*6/uL (ref 4.40–5.90)
RDW: 13.9 % (ref 11.5–14.5)
WBC: 7.2 10*3/uL (ref 3.8–10.6)

## 2014-11-03 LAB — URINALYSIS, COMPLETE
BACTERIA: NONE SEEN
Bilirubin,UR: NEGATIVE
Blood: NEGATIVE
GLUCOSE, UR: NEGATIVE mg/dL (ref 0–75)
Ketone: NEGATIVE
Leukocyte Esterase: NEGATIVE
NITRITE: NEGATIVE
Ph: 6 (ref 4.5–8.0)
SPECIFIC GRAVITY: 1.014 (ref 1.003–1.030)

## 2014-11-03 LAB — LIPASE, BLOOD: LIPASE: 35 U/L

## 2014-11-08 ENCOUNTER — Emergency Department
Admit: 2014-11-08 | Disposition: A | Discharge status: Home / Self Care | Payer: Self-pay | Admitting: Emergency Medicine

## 2014-11-08 LAB — DRUG SCREEN, URINE
Amphetamines, Ur Screen: NEGATIVE
BARBITURATES, UR SCREEN: NEGATIVE
BENZODIAZEPINE, UR SCRN: NEGATIVE
Cannabinoid 50 Ng, Ur ~~LOC~~: NEGATIVE
Cocaine Metabolite,Ur ~~LOC~~: NEGATIVE
MDMA (Ecstasy)Ur Screen: NEGATIVE
METHADONE, UR SCREEN: NEGATIVE
Opiate, Ur Screen: NEGATIVE
Phencyclidine (PCP) Ur S: NEGATIVE
TRICYCLIC, UR SCREEN: NEGATIVE

## 2014-11-08 LAB — COMPREHENSIVE METABOLIC PANEL
ALK PHOS: 51 U/L
AST: 34 U/L
Albumin: 3.6 g/dL
Anion Gap: 8 (ref 7–16)
BUN: 16 mg/dL
Bilirubin,Total: 0.8 mg/dL
CALCIUM: 9.2 mg/dL
CHLORIDE: 103 mmol/L
Co2: 27 mmol/L
Creatinine: 1.6 mg/dL — ABNORMAL HIGH
EGFR (African American): 55 — ABNORMAL LOW
EGFR (Non-African Amer.): 47 — ABNORMAL LOW
GLUCOSE: 143 mg/dL — AB
Potassium: 3.5 mmol/L
SGPT (ALT): 36 U/L
SODIUM: 138 mmol/L
Total Protein: 7.4 g/dL

## 2014-11-08 LAB — CBC
HCT: 42.2 % (ref 40.0–52.0)
HGB: 14.3 g/dL (ref 13.0–18.0)
MCH: 29 pg (ref 26.0–34.0)
MCHC: 33.8 g/dL (ref 32.0–36.0)
MCV: 86 fL (ref 80–100)
Platelet: 231 10*3/uL (ref 150–440)
RBC: 4.93 10*6/uL (ref 4.40–5.90)
RDW: 14.2 % (ref 11.5–14.5)
WBC: 7.4 10*3/uL (ref 3.8–10.6)

## 2014-11-08 LAB — URINALYSIS, COMPLETE
BACTERIA: NONE SEEN
BILIRUBIN, UR: NEGATIVE
BLOOD: NEGATIVE
GLUCOSE, UR: NEGATIVE mg/dL (ref 0–75)
Ketone: NEGATIVE
Leukocyte Esterase: NEGATIVE
NITRITE: NEGATIVE
Ph: 7 (ref 4.5–8.0)
Protein: >=500
RBC,UR: NONE SEEN /HPF (ref 0–5)
SQUAMOUS EPITHELIAL: NONE SEEN
Specific Gravity: 1.01 (ref 1.003–1.030)

## 2014-11-08 LAB — SALICYLATE LEVEL: Salicylates, Serum: <4 mg/dL

## 2014-11-08 LAB — ACETAMINOPHEN LEVEL: Acetaminophen: <10 ug/mL

## 2014-11-08 LAB — ETHANOL: Ethanol: <5 mg/dL

## 2014-11-09 NOTE — Consult Note (Signed)
PATIENT NAME:  [name], FRENZ MR#:  [id number] DATE OF BIRTH:  09-07-1955  DATE OF CONSULTATION:  08/23/2014  REFERRING PHYSICIAN:   CONSULTING PHYSICIAN:  Jeronica Stlouis K. Franchot Mimes, MD  LOCATION: [facility] Emergency Room, room number [place], [place].    AGE: 59 years.  SEX: Male.  RACE: African American  SUBJECTIVE: The patient was seen in consultation in the [facility] Emergency Room, room number 20. The patient is a 59 year old African American male, not employed, and has been on disability for back problems and chronic back pain, secondary to 2 slipped discs. He is being followed at [facility] and last appointment was 08/15/2014 and has been prescribed medications and continues to complain of back pain.   CHIEF COMPLAINT: "I wanted to kill somebody. This guy who killed my cousin a week ago."  HISTORY OF PRESENT ILLNESS: The patient reports that there was a guy who killed his cousin who was 67 years old a week ago and currently he is under the custody of the law and police arrested him and took him, and the patient wants to kill him if he comes across him and he wants to go get him if possible. The patient told this problem to his brother, who brought him here to [facility] for help.  PAST PSYCHIATRIC HISTORY: History of inpatient psychiatry on 2 occasions before. One inpatient hospitalization for psychiatry was at [facility] for 10 days for depression, and then other inpatient hospitalization was to [facility] for depression. The patient reports that he has an appointment coming up at [facility] with Dr. Collie Siad. He could not remember what medication he has been taking for his depression.   MENTAL STATUS: The patient is alert and oriented, calm, pleasant and cooperative. No agitation. Affect is flat. Mood is depressed. Admits feeling low and down and depressed about the recent loss of his cousin, to whom he was very close. He is very upset, irritable and angry about that guy  who killed his cousin, and wants to get him and kill him if possible. Denies any suicidal ideas or plans. No psychosis. Does not appear to be responding to any stimuli. Denies auditory or visual hallucinations or delusional ideas. Insight and judgement guarded. Impulse control poor.  IMPRESSION: Major depressive disorder, recurrent. Impulse control disorder.  RECOMMEND: Start patient on Cymbalta 60 mg p.o. daily, which will help him with his depression and also his pain. The patient was reassured and explained the same. The patient will be under observation in the Emergency Room for tonight and tomorrow the undersigned will reevaluate the patient for safety, for disposition and planning that is needed.    ____________________________ Wallace Cullens. Franchot Mimes, MD skc:MT D: 08/23/2014 16:32:09 ET T: 08/23/2014 16:38:15 ET JOB#: [id number]  cc: Arlyn Leak K. Franchot Mimes, MD, <Dictator> Dewain Penning MD ELECTRONICALLY SIGNED 08/30/2014 17:29

## 2014-11-09 NOTE — Consult Note (Signed)
Pt without bleeding, his stools are forming.  Maybe home tomorrow with oral Flagyl.  He should get 14 day course of Flagyl.  He can see me in office if he has a relapse of diarrhea.  I will sign off now.  Electronic Signatures: Manya Silvas (MD)  (Signed on 13-Apr-16 13:33)  Authored  Last Updated: 13-Apr-16 13:33 by Manya Silvas (MD)

## 2014-11-09 NOTE — Consult Note (Signed)
Pt diarrhea not as bad but still some blood in it.  He has stool positive for C. diff but toxin not present.  Will treat this the same way.  There may be other pathology in the colon such as coexistant bacterial infection with usual infectious bacteria so i would definitely continue the cipro until cultures cleared.  Since he appears to have C. diff i will hold off on doing flex sig at this time.  I will sign him out to one of my partners as i will be out of town [place], Friday.  He may eventually need flex sig or even colon but would hold for now and see how he does with treatment.  Discussed with Dr. Tor Netters.  Electronic Signatures: Manya Silvas (MD)  (Signed on 12-Apr-16 11:57)  Authored  Last Updated: 12-Apr-16 11:57 by Manya Silvas (MD)

## 2014-11-09 NOTE — Consult Note (Signed)
Psychiatry: Follow-up for patient with irritability and anger problems.  Says he was angry and wanted to kill somebody would killed his cousin.  Still feels depressed and angry.  Mood not good. new physical complaints. mental status the patient still makes no eye contact.  Mood stated as rageful and angry.  Says that he wants to kill himself and has a strong desire to do so.  Does not really cooperative with further conversation. is still endorsing intense suicidal ideation.  Not likely to benefit from inpatient hospitalization.  Continue monitoring in the emergency room continue current medicine reevaluate tomorrow.  Electronic Signatures: Tenae Graziosi, Madie Reno (MD)  (Signed on 15-Feb-16 17:41)  Authored  Last Updated: 15-Feb-16 17:41 by Gonzella Lex (MD)

## 2014-11-09 NOTE — Consult Note (Signed)
PATIENT NAME:  [name], PASZTOR MR#:  [id number] DATE OF BIRTH:  1956/05/17  DATE OF CONSULTATION:  10/20/2014  REFERRING PHYSICIAN:   CONSULTING PHYSICIAN:  Manya Silvas, MD  HISTORY OF PRESENT ILLNESS:  Patient is a 59 year old black male with a history of chronic back pain who had the onset of nausea, vomiting and diarrhea that lasted about 24 hours, started with urgent bowel movement, and watery diarrhea, and bleeding with bright red blood. He also had vomiting multiple times, but no blood in the vomitus. The patient had significant pain mostly in his right lower side; came to the ER and he was admitted.   A CAT scan showed diffuse thickening of the colon consistent with colitis, and most thickening in the distal descending, and sigmoid colon. He did have some diverticulosis but did not show any signs of diverticulitis. He had been having problems with constipation prior to this and he had taken some MiraLax the day before. He had less bleeding and less diarrhea since in the hospital. Stool studies are pending. He was started on empiric Cipro and Flagyl. I was asked to see him for his problems.   The patient thinks he may have had a colonoscopy but is not sure.   PAST MEDICAL HISTORY: 1.  Obstructive sleep apnea.  2.  Chronic back pain, opioid dependent.  3.  Hypertension.  4.  History of "enlarged heart."  5.  Major depression with ER visits for suicidal ideations.   SOCIAL HISTORY: Lives with his mother, has been on disability for at least 5 years because of his back.   HABITS: Smokes about a quarter of a pack a day.   FAMILY MEDICAL HISTORY: Stroke in his mother, negative for colon cancer.   REVIEW OF SYSTEMS: No fever or chills. No asthma or wheezing. He did have some vomiting and had a little shortness of breath related to that. No dysuria or frequency. No history of kidney stones. Positive for depression.   HOME MEDICATIONS: Trazodone, 100 mg 1 or 2 tablets at bedtime,  oxycodone 30 mg 1 tablet every 12 hours for pain, 10 mg 1 tablet q. 4 for pain; hydrochlorothiazide 25 mg a day; gabapentin 300 mg 1 capsule 3 times a day, clonidine 0.2 mg 1 tablet twice a day, and amlodipine 10 mg 1 tablet once a day.   ALLERGIES: TO NAPROXEN, MELOXICAM, FUROSEMIDE AND TYLENOL.   PHYSICAL EXAMINATION:  GENERAL: Shows black male in no acute distress.  VITAL SIGNS: Temperature 98.1, pulse 72, respirations 18, blood pressure 139/94, pulse oximetry 98%.  HEENT: Sclerae anicteric. Conjunctivae negative. The head is atraumatic.  CHEST: Clear.  HEART: Shows no murmurs or gallops I can hear.  ABDOMEN: Minimal tenderness right lower quadrant. No hepatosplenomegaly. No masses. No bruits. Bowel sounds are present.  SKIN: Warm and dry.  PSYCHIATRIC: Mood and affect are appropriate.   The patient is a little bit difficult not to take a history from.   LABORATORY DATA: Glucose 124, BUN 16, creatinine 1.54, sodium 135, potassium 3.2, chloride 101, CO2 of 29, calcium 7.9, magnesium 1.5, cholesterol 158, triglycerides 107,  troponins slightly elevated at 0.05, total protein 5.7, albumin 2.7, total bilirubin 1.2, alkaline phosphatase 39, SGOT 22, SGPT 23, CPK-MB is 5.9; drug screen is negative, white count of 11.6, hemoglobin 12.6, white count on admission was 13.5.   ASSESSMENT: Diarrhea with vomiting, bleeding, and abdominal pain. A CAT scan showing diffuse thickening of the colon, probably infectious in origin. I cannot rule out  the possibility of ischemic colitis of the left colon, cannot rule out the possibility of a neoplasm in that area.   PLAN: To follow him along for a day or so, await stool studies to come back, continue the antibiotics, consider colonoscopy or flexible sigmoidoscopy, we will decide on that tomorrow.    ____________________________ Manya Silvas, MD rte:nt D: 10/20/2014 18:51:40 ET T: 10/20/2014 19:17:22 ET JOB#: [id number]  cc: Manya Silvas, MD,  <Dictator> Manya Silvas MD ELECTRONICALLY SIGNED 10/25/2014 16:23

## 2014-11-09 NOTE — H&P (Signed)
PATIENT NAME:  [name], [name] MR#:  [id number] DATE OF BIRTH:  1955/08/31  DATE OF ADMISSION:  10/19/2014  REFERRING EMERGENCY ROOM PHYSICIAN:  PRIMARY CARE PHYSICIAN: None.   CHIEF COMPLAINT: Nausea, vomiting, diarrhea, bright red blood per rectum.   HISTORY OF PRESENT ILLNESS: This 59 year old man with past medical history of hypertension, obstructive sleep apnea and chronic back pain on daily opiates, presents today with 24 hours of nausea, vomiting, and diarrhea. He reports that yesterday in the afternoon he went to his cousin's funeral during which he felt very fatigued, slept through most of it, and then afterwards had urgency for a bowel movement. He went home and began to have profuse watery diarrhea. He has had innumerable bowel movements over the past 24 hours. At some point during the night he realized that he had bright red blood in the toilet. He describes it as "blood shooting out of him." He also developed vomiting during this time and has had at least 15 episodes of emesis over the past 24 hours. He denies hematemesis or coffee ground emesis. He states that the emesis is mostly clear or yellow in color. Prior to these episodes, he had been constipated for several weeks and had been taking MiraLax. He is not sure when his bowel movement prior to yesterday was. He has also had 10/10 abdominal pain, mostly located in the right lower quadrant throughout the night. On presentation to the Emergency Room, CT scan shows diffuse colitis. Hemoglobin blood pressure are stable. His troponin is slightly elevated. He is being admitted for further evaluation and treatment of hemorrhagic colitis and elevated troponin.   PAST MEDICAL HISTORY: 1.  Obstructive sleep apnea.  2.  Chronic back pain, opiate dependent.  3.  Hypertension.  4.  "Enlarged heart."  5.  There is no 2-D echocardiogram in this chart. He denies a history of congestive heart failure, but states he has been told he has an enlarged  heart.  6.  Major depression with multiple Emergency Room visits for suicidal ideation.    SOCIAL HISTORY: The patient lives with his mother. He is a current smoker of about 1/4 pack of cigarettes for the past several decades. He does not drink alcohol or use any illicit substances. He uses a cane for ambulation due to back pain and left leg pain.   FAMILY MEDICAL HISTORY: Positive for stroke in his mother. Negative for colon cancer, though his mother reports she has a history of frequent colitis. No family history of coronary artery disease.   REVIEW OF SYSTEMS: CONSTITUTIONAL: Negative for fevers, weakness or weight change.  HEENT: No change in vision or hearing. No pain in the eyes or ears. No sinus congestion, sore throat or difficulty swallowing.  RESPIRATORY: He does report that he was short of breath overnight during episodes of vomiting. No coughing, wheezing, hemoptysis, or painful respirations.  CARDIOVASCULAR: No chest pain, orthopnea, edema, or palpitations. No syncope.  GASTROINTESTINAL: Positive as above for nausea, vomiting, diarrhea, abdominal pain, hematochezia. No hematemesis.  GENITOURINARY: No dysuria or frequency.  ENDOCRINE: No hot or cold intolerance, polyuria or polydipsia.  HEMATOLOGIC: No history of easy bruising or bleeding.  SKIN: No new rashes or lesions.  MUSCULOSKELETAL: No new pain in the neck, back, shoulders, knees, or hips. He has chronic lumbar pain with sciatic pain going to the left leg.  NEUROLOGIC: No history of dementia, seizures, headache or CVA.  PSYCHIATRIC: Positive for major depression. No schizophrenia or bipolar disorder.   HOME MEDICATIONS:  1.  Trazodone 100 mg 1 to 2 tablets once a day at bedtime as needed for sleep.  2.  Oxycodone 30 mg 1 tablet every 12 hours for pain.  3.  Oxycodone 10 mg 1 tablet 4 times a day.   4.  Hydrochlorothiazide 25 mg 1 tablet daily.  5.  Gabapentin 300 mg 1 capsule 3 times a day.  6.  Clonidine 0.2 mg 1 tablet  twice a day.  7.  Amlodipine 10 mg 1 tablet once a day.   ALLERGIES: NAPROXEN, MELOXICAM, FUROSEMIDE, AND TYLENOL.   PHYSICAL EXAMINATION: VITAL SIGNS: Temperature 97.8, pulse 85, respirations 18, blood pressure 156/106, oxygenation 96% on room air.  GENERAL: No acute distress.  HEENT: Pupils equal, round, and reactive to light. Conjunctivae are slightly injected. Extraocular motion is intact. Oral mucous membranes are pink and moist. Posterior oropharynx is clear with no exudate, edema, or erythema. He has fair dentition. No cervical lymphadenopathy. Trachea is midline.  RESPIRATORY: Lungs are clear to auscultation bilaterally with good air movement.  CARDIOVASCULAR: Regular rate and rhythm. No murmurs, rubs, or gallops. No peripheral edema. Peripheral pulses are 1+.  ABDOMEN: Distended, diffusely tender with most pain located in the right lower quadrant. Bowel sounds are normal. Abdomen is soft.  SKIN: No new rashes or lesions. No open wounds. He has many scars over both lower extremities from probable skin abscesses or folliculitis in the past, well-healed.  MUSCULOSKELETAL: No swollen tender joints. No joint effusions. Range of motion is normal. Strength is 5/5 throughout.  NEUROLOGIC: Cranial nerves II through XII grossly intact. Strength and sensation are intact, nonfocal examination.  PSYCHIATRIC: He is alert and oriented with good insight into his clinical condition. No signs of uncontrolled depression or anxiety.   LABORATORY DATA: Sodium 136, potassium 3.2, chloride 99, bicarbonate 28, BUN 20, creatinine 1.6, glucose 138, calcium 8.7. LFTs are normal. Troponin is 0.05. White blood cells 13.5, hemoglobin 15.3, platelets 169,000, MCV is 84. UA negative for signs of infection.   IMAGING: CT of abdomen and pelvis with contrast shows colitis, most significant involving the distal descending and sigmoid colon; however, mild colonic wall thickening is seen throughout. There are colonic  diverticula; however, no peridiverticular inflammatory changes. There is an incidental finding of bilateral renal cysts and fat-containing umbilical hernia.   ASSESSMENT AND PLAN:   1.  Acute hemorrhagic colitis: This may be infectious versus inflammatory. I have ordered blood cultures, stool cultures, stool for Clostridium difficile. Will start empiric ciprofloxacin and Flagyl. I have consulted GI. His mother reports a history of recurrent colitis, which may indicate that he has a family history of inflammatory bowel disease.  2.  Chronic kidney disease: This is stable. His creatinine is actually lower than at prior admissions.  3.  Hypokalemia: Replete, check magnesium, and continue to monitor.  4.  Elevated troponin, possible non-ST elevation myocardial infarction: His EKG shows no signs of ischemic change at this time. We will hold off on administration of aspirin or any anticoagulation in the setting of acute GI bleeding. We will cycle cardiac enzymes and monitor him on telemetry. He denies chest pain. I suspect this is demand ischemia after 24 hours of vigorous vomiting and diarrhea. We will hold off on cardiology consultation unless troponins escalate.  5.  Leukocytosis: This is likely reactive and due to his colitis.  6.  Hypertension: Will resume his home medications.  7.  Chronic back pain: We will resume his home regimen for pain control. Note that his  opiates may be contributing to chronic constipation which may ultimately be contributing to his colon issues at this time. He needs a robust stool regimen upon discharge.  8.  History of depression: The patient does not seem depressed at the time of my interview. No changes to medications at this time. He is not on any maintenance antidepressives.  9.  Prophylaxis: Will hold off on pharmacological deep vein thrombosis prophylaxis in the setting of gastrointestinal bleed, Protonix for gastrointestinal prophylaxis.  10.   Patient is a full code.    TIME SPENT ON ADMISSION: 45 minutes.   ____________________________ Earleen Newport. Volanda Napoleon, MD cpw:at D: 10/19/2014 20:51:06 ET T: 10/19/2014 21:13:20 ET JOB#: [id number]  cc: Barnetta Chapel P. Volanda Napoleon, MD, <Dictator> Aldean Jewett MD ELECTRONICALLY SIGNED 10/20/2014 20:52

## 2014-11-09 NOTE — Consult Note (Signed)
Psychiatry: Follow-up for this patient with depression.  Continues to report feeling severely depressed.  Says that he has thoughts about wishing he were dead and feels hopeless. mental status exam patient is fairly withdrawn with limited psychomotor activity.  Speech minimal in amount.  Affect flat.  Mood stated as depressed.  Endorses suicidal ideation. will be admitted to the psychiatry ward per the orders that had been placed over the weekend.  No change today and treatment.  Psychoeducation completed.  Electronic Signatures: Clapacs, Madie Reno (MD)  (Signed on 16-Feb-16 18:55)  Authored  Last Updated: 16-Feb-16 18:55 by Gonzella Lex (MD)

## 2014-11-09 NOTE — Discharge Summary (Signed)
PATIENT NAME:  [name], [name] MR#:  [id number] DATE OF BIRTH:  November 05, 1955  DATE OF ADMISSION:  10/19/2014 DATE OF DISCHARGE:  10/23/2014  For a detailed note please check the history and physical done on admission by Dr. Myrtis Ser.   DIAGNOSES AT DISCHARGE:  1.  Clostridium difficile colitis, abdominal pain, and bloody diarrhea secondary to Clostridium difficile colitis.  2.  Chronic kidney disease stage III.  3.  Accelerated hypertension.  4.  Chronic pain.   DIET: The patient is being discharged on a low-sodium diet.   ACTIVITY: As tolerated.   FOLLOWUP:  With Dr. Gaylyn Cheers in the next 1-2 weeks.   DISCHARGE MEDICATIONS: Amlodipine 10 mg daily, gabapentin 300 mg t.i.d., trazodone 100 mg 1-2 tabs at bedtime as needed, hydrochlorothiazide 25 mg daily, OxyContin 30 mg b.i.d., oxycodone 10 mg 4 times daily as needed, clonidine 0.3 mg b.i.d., and metronidazole 500 mg q. 8 hours x 14 days.   CONSULTANTS DURING THE HOSPITAL COURSE: Dr. Vira Agar from gastroenterology.  PERTINENT STUDIES DONE DURING THE HOSPITAL COURSE: CT scan of the abdomen and pelvis done with contrast on admission showing colitis, most significant involving the distal descending and sigmoid colon, there were colonic diverticula, no peridiverticular inflammatory change. A KUB done showing dilated loops of small bowel in the right lower quadrant of the abdomen, nonspecific, this would represent a small bowel ileus secondary to enteritis or partial obstruction. An echocardiogram done showing ejection fraction of 60-65%, normal global LV systolic function, moderate LVH, mildly dilated left atrium.   The patient's stool for Clostridium difficile noted to be positive, PCR noted to be positive. Stool for Campylobacter to be negative.   HOSPITAL COURSE: This is a 59 year old male with medical problems as mentioned above, who presented to the hospital with abdominal pain and bloody stools and also noted to have an elevated  troponin.   1.  Colitis. This was likely the cause of the patient's abdominal pain and bloody stools. The patient initially was admitted to the hospital, was started on IV ciprofloxacin and Flagyl. The patient's stool came back positive for Clostridium difficile, but he was still maintained on the ciprofloxacin for possible superinfection. The patient's stool comprehensive cultures although were negative for Campylobacter or Salmonella, therefore the patient was taken off the Cipro and has been maintained on just oral Flagyl. The patient's clinical symptoms over the next few days have improved. He has less diarrhea and the stools are not bloody anymore. His hemoglobin has remained stable. The patient was seen by gastroenterology who agreed with 2 weeks more of oral Flagyl therapy to finish treatment for his Clostridium difficile colitis.   The patient will have close follow-up with GI as an outpatient.   2.  Elevated troponin. The patient had some atypical chest pain and elevated troponin on admission. This was likely noncardiac in nature. His troponins did not trend upwards. He had no acute chest pain. He was therefore taken off telemetry. His chest pain has now resolved.  3.  Depression. The patient was maintained on his trazodone, he will resume that.  4.  Hypertension. The patient did have some accelerated hypertension. He was started on some clonidine and his dose was slowly advanced. At this point he is being discharged on oral clonidine and Norvasc.  5.  Chronic pain. The patient was maintained on his OxyContin and oxycodone. He will resume that.  6.  Hypokalemia. This has since then been supplemented and resolved and that was likely a result  of his colitis and diarrhea.   CODE STATUS: The patient is a full code.   DISPOSITION: He is being discharged home.   TIME SPENT: 40 minutes.      ____________________________ Belia Heman. Verdell Carmine, MD vjs:bu D: 10/23/2014 16:03:15 ET T: 10/23/2014  16:26:16 ET JOB#: [id number]  cc: Belia Heman. Verdell Carmine, MD, <Dictator> Manya Silvas, MD Henreitta Leber MD ELECTRONICALLY SIGNED 10/29/2014 11:29

## 2014-11-18 NOTE — H&P (Signed)
PATIENT NAME:  [name], [name] [id number] OF BIRTH:  1955-11-26  INFORMATION: A 59 year old man with a history of chronic pain presented to the Emergency Room with a chief complaint of suicidal thinking OF PRESENT ILLNESS: Information obtained from the patient and the chart. The patient states he has been thinking about "ending it all." He specifically thought about shooting himself in the head. He said this is because his pain is so bad he just cannot stand it anymore. He see a pain doctor but medications are not helping. Mood has been down and disgusted and bad for a long time, but his suicidal thinking is more recent. He sleeps poorly at night. He says he sometimes has visual hallucinations of seeing little people walking around. He is not currently getting any psychiatric treatment. He claims that he is still taking all of his prescribed medicine including his pain medicine and has not had any change in that. Denies that he is abusing any drugs or alcohol.   patient also c/o severe tooth pain secondary to a dental abscess. ABUSE HISTORY: He denies any history of substance abuse. Denies that he abuses his narcotic pain medicine. Denies drinking.   PSYCHIATRIC HISTORY: Claims that he has been prescribed Xanax in the past, but never been treated with an antidepressant. Denies suicide attempts, denies psychiatric hospitalization.   MEDICAL HISTORY: Chronic pain related to chronic back injury. Also, prostate hypertrophy, also high blood pressure.   HISTORY: some family members with depression. HISTORY: Recently moved here from [place]. Living with his parents, not working. Gets disability.   MEDICATIONS: Based on last hospitalization, it would be amlodipine 10 mg per day, Flomax 0.4 mg once a day with a meal, MiraLax 17 grams per day. Also appears to be taking chronic OxyContin 30 mg twice a day, as well as plain oxycodone 4 times a day.  No known drug allergies.   MENTAL STATUS EXAMINATION: pleasant and  cooperative.  Appears in pain from tooth acheCONTACT: good   eye contactregular tone, volume and rateACTIVITY: mildly decreasedPROCESS:linearCONTENT:denies HI or A/V H.  Does report SI OF SYSTEMS:no weight loss, fever, chills, weakness or fatigue.no visual loos, blurred vision, double vision or yellow sclerae.  Ears, nose and throat: no hearing loos, sneezing, congestion, runny nose or sore throat. But c/o severe tooth ache. no rash or itchingno chest pain, chest pressure or discomfort.  No palpitations or edema.no shortness of breath, cough or sputum.no anorexia, nausea, vomiting or diarrhea. No abdominal pain or blood.no burning on urination.chronic  lower back painno anemia, bleeding or bruising.no enlarged nodes. No h/o splenectomy.see history of present illness.no headache, dizziness, syncope, paralysis, ataxia, numbness or tingling in extremities.  No change in bowel or bladder control.no reports of sweating, cold or heat intolerance.  No polyuria or poly- dipsia.no history of asthma, hives, eczema or rhinitis.  SIGNS: BP:  153/112 ,  R:20 , P:63 , T: 98.1 PHYSICAL EXAMINATION: APPEARANCE: The patient is alert, oriented.  Patient is in no acute distress. Head is normocephalic.  Pupils are equal and reactive. Supple without lymphadenopathy. Regular rate and rhythm. normal breath sounds. No crackles or wheezes are heard. Soft, nontender, nondistended with good bowel sounds heard. Without cyanosis, clubbing.  1+edema.  NEUROLOGICAL: Gross nonfocal. RESULTSLEVEL:negTOXICOLOGY:negPREGNANCY:n/aclear DIAGNOSIS, PRINCIPAL AND PRIMARY: I: Unspecified depressive d/o.  R/O opioid use disorder DIAGNOSES:II: No diagnosis. III: Chronic pain, high blood pressure, prostate hypertrophy, tooth abscess IV: moderate V: Functioning at time of evaluation 35. neg for opiods at admission.  Pt reports taking meds  daily.  Possible opiod withdrawal could have trigger worsening mood and admission.  Pt could be diverting his  medications.amlodipine 10 mg /dLasix 20 mg/dFlomax 0.4pain: oxycontin ER 30 mg bid, will decrease  and Roxicodone to 5 mg q 8 h10 mg q 6 h.  Ibuprofen q 8 h , ibuprofen and lidoderm patch were ordered.  Will need collateral info from pain management and possibly from family to find out why pt is neg on urine toxpain: oragel, ibuprofen and amoxacillin for possible abscess      Electronic Signatures: Hildred Priest (MD) (Signed on 22-Aug-15 11:48)  Authored   Last Updated: 22-Aug-15 11:56 by Hildred Priest (MD)

## 2014-12-07 ENCOUNTER — Emergency Department
Admission: EM | Admit: 2014-12-07 | Discharge: 2014-12-08 | Discharge status: Against Medical Advice | Payer: Medicare HMO | Attending: Emergency Medicine | Admitting: Emergency Medicine

## 2014-12-07 DIAGNOSIS — Z720 Tobacco use: Secondary | ICD-10-CM | POA: Diagnosis not present

## 2014-12-07 DIAGNOSIS — R1030 Lower abdominal pain, unspecified: Secondary | ICD-10-CM | POA: Diagnosis present

## 2014-12-07 DIAGNOSIS — I10 Essential (primary) hypertension: Secondary | ICD-10-CM | POA: Insufficient documentation

## 2014-12-08 ENCOUNTER — Emergency Department
Admission: EM | Admit: 2014-12-08 | Discharge: 2014-12-08 | Discharge status: Against Medical Advice | Payer: Medicare HMO | Attending: Emergency Medicine | Admitting: Emergency Medicine

## 2014-12-08 ENCOUNTER — Encounter: Payer: Self-pay | Admitting: Emergency Medicine

## 2014-12-08 DIAGNOSIS — R110 Nausea: Secondary | ICD-10-CM | POA: Diagnosis not present

## 2014-12-08 DIAGNOSIS — R1030 Lower abdominal pain, unspecified: Secondary | ICD-10-CM | POA: Insufficient documentation

## 2014-12-08 DIAGNOSIS — Z720 Tobacco use: Secondary | ICD-10-CM | POA: Insufficient documentation

## 2014-12-08 DIAGNOSIS — I10 Essential (primary) hypertension: Secondary | ICD-10-CM | POA: Diagnosis not present

## 2014-12-08 HISTORY — DX: Essential (primary) hypertension: I10

## 2014-12-08 LAB — URINALYSIS COMPLETE WITH MICROSCOPIC (ARMC ONLY)
BACTERIA UA: NONE SEEN
Bilirubin Urine: NEGATIVE
Glucose, UA: NEGATIVE mg/dL
Hgb urine dipstick: NEGATIVE
KETONES UR: NEGATIVE mg/dL
LEUKOCYTES UA: NEGATIVE
Nitrite: NEGATIVE
Protein, ur: 100 mg/dL — AB
Specific Gravity, Urine: 1.011 (ref 1.005–1.030)
pH: 7 (ref 5.0–8.0)

## 2014-12-08 LAB — COMPREHENSIVE METABOLIC PANEL
ALBUMIN: 3.9 g/dL (ref 3.5–5.0)
ALK PHOS: 56 U/L (ref 38–126)
ALT: 45 U/L (ref 17–63)
AST: 35 U/L (ref 15–41)
Anion gap: 8 (ref 5–15)
BUN: 25 mg/dL — ABNORMAL HIGH (ref 6–20)
CALCIUM: 9.2 mg/dL (ref 8.9–10.3)
CHLORIDE: 98 mmol/L — AB (ref 101–111)
CO2: 30 mmol/L (ref 22–32)
CREATININE: 1.93 mg/dL — AB (ref 0.61–1.24)
GFR calc Af Amer: 43 mL/min — ABNORMAL LOW (ref >60)
GFR calc non Af Amer: 37 mL/min — ABNORMAL LOW (ref >60)
GLUCOSE: 107 mg/dL — AB (ref 65–99)
Potassium: 3.4 mmol/L — ABNORMAL LOW (ref 3.5–5.1)
SODIUM: 136 mmol/L (ref 135–145)
Total Bilirubin: 0.6 mg/dL (ref 0.3–1.2)
Total Protein: 7.7 g/dL (ref 6.5–8.1)

## 2014-12-08 LAB — CBC WITH DIFFERENTIAL/PLATELET
Basophils Absolute: 0 10*3/uL (ref 0–0.1)
Basophils Relative: 1 %
Eosinophils Absolute: 0.1 10*3/uL (ref 0–0.7)
Eosinophils Relative: 1 %
HCT: 42.3 % (ref 40.0–52.0)
HEMOGLOBIN: 14.4 g/dL (ref 13.0–18.0)
LYMPHS PCT: 45 %
Lymphs Abs: 3.3 10*3/uL (ref 1.0–3.6)
MCH: 29.1 pg (ref 26.0–34.0)
MCHC: 34.1 g/dL (ref 32.0–36.0)
MCV: 85.5 fL (ref 80.0–100.0)
Monocytes Absolute: 0.5 10*3/uL (ref 0.2–1.0)
Monocytes Relative: 7 %
NEUTROS PCT: 46 %
Neutro Abs: 3.4 10*3/uL (ref 1.4–6.5)
Platelets: 193 10*3/uL (ref 150–440)
RBC: 4.94 MIL/uL (ref 4.40–5.90)
RDW: 13.8 % (ref 11.5–14.5)
WBC: 7.4 10*3/uL (ref 3.8–10.6)

## 2014-12-08 LAB — LIPASE, BLOOD: Lipase: 49 U/L (ref 22–51)

## 2014-12-08 MED ORDER — DICYCLOMINE HCL 10 MG/ML IM SOLN
INTRAMUSCULAR | Status: AC
  Filled 2014-12-08: qty 2

## 2014-12-08 MED ORDER — DICYCLOMINE HCL 10 MG/ML IM SOLN
20.0000 mg | Freq: Once | INTRAMUSCULAR | Status: AC
  Administered 2014-12-08: 20 mg via INTRAMUSCULAR

## 2014-12-08 MED ORDER — DICYCLOMINE HCL 10 MG/ML IM SOLN
INTRAMUSCULAR | Status: AC
  Administered 2014-12-08: 20 mg via INTRAMUSCULAR
  Filled 2014-12-08: qty 2

## 2014-12-08 NOTE — ED Notes (Signed)
Pt. not in room upon final exam of MD and discharge instructions. MD and Charge nurse notified.

## 2014-12-08 NOTE — ED Notes (Signed)
Pt presents to ER alert and in NAD. pt states lower abd pain x 2 days, denies urinary s/s.

## 2014-12-08 NOTE — ED Notes (Signed)
Pt not in room upon final assessment by MD.

## 2014-12-08 NOTE — ED Notes (Signed)
Pt reports BLQ that started three days ago. Pt states that he has been nauseated but denies V/D or urinary s/s. Pt NAD, was here last night and left. He states that he had blood work, urine and CT.

## 2014-12-08 NOTE — ED Provider Notes (Signed)
[facility] Emergency Department Provider Note   ____________________________________________  Time seen: 40  I have reviewed the triage vital signs and the nursing notes.   HISTORY  Chief Complaint Abdominal Pain   History limited by: Not Limited   HPI [name] is a 59 y.o. male who presents to the emergency department with lower abdominal pain. He states his symptoms have been going on for 3 days. It has been constant. It does not change with his food. He has not noticed any change in his defecation. No bloody stool. No diarrhea. Has had some nausea but no vomiting. Denies any fevers. Denies any change in urination.     Past Medical History  Diagnosis Date  . Hypertension     Patient Active Problem List   Diagnosis Date Noted  . HEPATITIS C 06/28/2008  . HYPERLIPIDEMIA 06/28/2008  . SUBSTANCE ABUSE, MULTIPLE 06/28/2008  . HYPERTENSION 06/27/2008    History reviewed. No pertinent past surgical history.  No current outpatient prescriptions on file.  Allergies Cyclobenzaprine and Tylenol  History reviewed. No pertinent family history.  Social History History  Substance Use Topics  . Smoking status: Current Every Day Smoker  . Smokeless tobacco: Not on file  . Alcohol Use: No    Review of Systems  Constitutional: Negative for fever. Cardiovascular: Negative for chest pain. Respiratory: Negative for shortness of breath. Gastrointestinal: Positive for abdominal pain Genitourinary: Negative for dysuria. Musculoskeletal: Negative for back pain. Skin: Negative for rash. Neurological: Negative for headaches, focal weakness or numbness.   10-point ROS otherwise negative.  ____________________________________________   PHYSICAL EXAM:  VITAL SIGNS: ED Triage Vitals  Enc Vitals Group     BP 12/08/14 0753 139/96 mmHg     Pulse Rate 12/08/14 0753 73     Resp 12/08/14 0753 18     Temp 12/08/14 0753 98.1 F (36.7 C)      Temp Source 12/08/14 0753 Oral     SpO2 12/08/14 0753 98 %     Weight 12/08/14 0753 280 lb (127.007 kg)     Height 12/08/14 0753 6' (1.829 m)     Head Cir --      Peak Flow --      Pain Score 12/08/14 0754 9   Constitutional: Alert and oriented. Well appearing and in no distress. Eyes: Conjunctivae are normal. PERRL. Normal extraocular movements. ENT   Head: Normocephalic and atraumatic.   Nose: No congestion/rhinnorhea.   Mouth/Throat: Mucous membranes are moist.   Neck: No stridor. Hematological/Lymphatic/Immunilogical: No cervical lymphadenopathy. Cardiovascular: Normal rate, regular rhythm.  No murmurs, rubs, or gallops. Respiratory: Normal respiratory effort without tachypnea nor retractions. Breath sounds are clear and equal bilaterally. No wheezes/rales/rhonchi. Gastrointestinal: Soft and nontender. No rebound. No guarding. No distention.  Genitourinary: Deferred Musculoskeletal: Normal range of motion in all extremities. No joint effusions.  No lower extremity tenderness nor edema. Neurologic:  Normal speech and language. No gross focal neurologic deficits are appreciated. Speech is normal.  Skin:  Skin is warm, dry and intact. No rash noted. Psychiatric: Mood and affect are normal. Speech and behavior are normal. Patient exhibits appropriate insight and judgment.  ____________________________________________    LABS (pertinent positives/negatives)  Labs reviewed from visit earlier this morning   ____________________________________________   EKG  I, Nance Pear, attending physician, personally viewed and interpreted this EKG  EKG Time: 0033 Rate: 50 Rhythm: sinus bradycardia Axis: LAD Intervals: qtc 437 QRS: narrow ST changes: no st elevation    ____________________________________________  RADIOLOGY  None  ____________________________________________   PROCEDURES  Procedure(s) performed: None  Critical Care performed:  No  ____________________________________________   INITIAL IMPRESSION / ASSESSMENT AND PLAN / ED COURSE  Pertinent labs & imaging results that were available during my care of the patient were reviewed by me and considered in my medical decision making (see chart for details).  He presents with lower abdominal pain for 3 days. Patient has had some nausea but no vomiting. No concerning change in defecation. On exam patient with no significant tenderness to the abdomen. It is soft. Blood work is without any concerning findings, no leukocytosis on lab work drawn earlier [employer]. Will try Bentyl and reassess.  ----------------------------------------- 11:30 AM on 12/08/2014 -----------------------------------------  Patient appears to have left the room after medication administration prior to final discussion about return precautions. Patient left prior to receiving discharge paperwork.  ____________________________________________   FINAL CLINICAL IMPRESSION(S) / ED DIAGNOSES  Final diagnoses:  Lower abdominal pain     Nance Pear, MD 12/08/14 1135

## 2015-02-18 ENCOUNTER — Encounter: Payer: Self-pay | Admitting: *Deleted

## 2015-02-18 ENCOUNTER — Emergency Department
Admission: EM | Admit: 2015-02-18 | Discharge: 2015-02-18 | Discharge status: Against Medical Advice | Payer: Medicare HMO | Attending: Emergency Medicine | Admitting: Emergency Medicine

## 2015-02-18 DIAGNOSIS — Z720 Tobacco use: Secondary | ICD-10-CM | POA: Insufficient documentation

## 2015-02-18 DIAGNOSIS — R197 Diarrhea, unspecified: Secondary | ICD-10-CM | POA: Insufficient documentation

## 2015-02-18 DIAGNOSIS — K625 Hemorrhage of anus and rectum: Secondary | ICD-10-CM | POA: Diagnosis present

## 2015-02-18 DIAGNOSIS — I10 Essential (primary) hypertension: Secondary | ICD-10-CM | POA: Insufficient documentation

## 2015-02-18 DIAGNOSIS — R109 Unspecified abdominal pain: Secondary | ICD-10-CM | POA: Diagnosis not present

## 2015-02-18 HISTORY — DX: Disorder of kidney and ureter, unspecified: N28.9

## 2015-02-18 LAB — CBC WITH DIFFERENTIAL/PLATELET
BASOS PCT: 1 %
Basophils Absolute: 0 10*3/uL (ref 0–0.1)
EOS PCT: 1 %
Eosinophils Absolute: 0 10*3/uL (ref 0–0.7)
HEMATOCRIT: 44.1 % (ref 40.0–52.0)
HEMOGLOBIN: 15.3 g/dL (ref 13.0–18.0)
Lymphocytes Relative: 30 %
Lymphs Abs: 2.1 10*3/uL (ref 1.0–3.6)
MCH: 29.2 pg (ref 26.0–34.0)
MCHC: 34.6 g/dL (ref 32.0–36.0)
MCV: 84.6 fL (ref 80.0–100.0)
MONO ABS: 0.6 10*3/uL (ref 0.2–1.0)
MONOS PCT: 9 %
Neutro Abs: 4.1 10*3/uL (ref 1.4–6.5)
Neutrophils Relative %: 59 %
Platelets: 172 10*3/uL (ref 150–440)
RBC: 5.22 MIL/uL (ref 4.40–5.90)
RDW: 14.5 % (ref 11.5–14.5)
WBC: 6.9 10*3/uL (ref 3.8–10.6)

## 2015-02-18 LAB — COMPREHENSIVE METABOLIC PANEL
ALK PHOS: 51 U/L (ref 38–126)
ALT: 52 U/L (ref 17–63)
ANION GAP: 12 (ref 5–15)
AST: 48 U/L — ABNORMAL HIGH (ref 15–41)
Albumin: 3.4 g/dL — ABNORMAL LOW (ref 3.5–5.0)
BILIRUBIN TOTAL: 1 mg/dL (ref 0.3–1.2)
BUN: 19 mg/dL (ref 6–20)
CHLORIDE: 101 mmol/L (ref 101–111)
CO2: 26 mmol/L (ref 22–32)
Calcium: 9 mg/dL (ref 8.9–10.3)
Creatinine, Ser: 1.81 mg/dL — ABNORMAL HIGH (ref 0.61–1.24)
GFR calc non Af Amer: 40 mL/min — ABNORMAL LOW (ref >60)
GFR, EST AFRICAN AMERICAN: 46 mL/min — AB (ref >60)
Glucose, Bld: 155 mg/dL — ABNORMAL HIGH (ref 65–99)
POTASSIUM: 3.2 mmol/L — AB (ref 3.5–5.1)
Sodium: 139 mmol/L (ref 135–145)
TOTAL PROTEIN: 7.1 g/dL (ref 6.5–8.1)

## 2015-02-18 LAB — LIPASE, BLOOD: Lipase: 27 U/L (ref 22–51)

## 2015-02-18 MED ORDER — ACETAMINOPHEN 325 MG PO TABS: 650.0000 mg | ORAL_TABLET | Freq: Once | ORAL | Status: DC

## 2015-02-18 MED ORDER — ACETAMINOPHEN 500 MG PO TABS
ORAL_TABLET | ORAL | Status: AC
  Filled 2015-02-18: qty 2

## 2015-02-18 NOTE — ED Notes (Signed)
Pt left AMA.  Request that pt stay and talk w/ MD, pt refused.  Pt stated "this damn hospital is on my shit list".  Pt demanded IV be taken out.  Pt stated that he was going to [facility].

## 2015-02-18 NOTE — ED Provider Notes (Signed)
[facility] Emergency Department Provider Note  ____________________________________________  Time seen: 1610  I have reviewed the triage vital signs and the nursing notes.   HISTORY  Chief Complaint Rectal Bleeding and Abdominal Pain   History limited by: Not Limited   HPI [name] is a 59 y.o. male who presents to the emergency department today with concerns for abdominal pain and rectal bleeding. Patient states that the symptoms started today. He states that they have been constant. He has had some diarrhea today. He has noticed bright red blood mixed in with it. He states his abdominal pain is located in the lower abdomen. It is moderate. He denies any vomiting or nausea. Denies any fevers. States he has had similar symptoms in the past and was diagnosed with an intestinal infection.     Past Medical History  Diagnosis Date  . Hypertension   . Renal disorder     Patient Active Problem List   Diagnosis Date Noted  . HEPATITIS C 06/28/2008  . HYPERLIPIDEMIA 06/28/2008  . SUBSTANCE ABUSE, MULTIPLE 06/28/2008  . HYPERTENSION 06/27/2008    History reviewed. No pertinent past surgical history.  No current outpatient prescriptions on file.  Allergies Cyclobenzaprine and Tylenol  No family history on file.  Social History Social History  Substance Use Topics  . Smoking status: Current Every Day Smoker  . Smokeless tobacco: None  . Alcohol Use: No    Review of Systems  Constitutional: Negative for fever. Cardiovascular: Negative for chest pain. Respiratory: Negative for shortness of breath. Gastrointestinal: Positive for abdominal pain, diarrhea, GI bleed Genitourinary: Negative for dysuria. Musculoskeletal: Negative for back pain. Skin: Negative for rash. Neurological: Negative for headaches, focal weakness or numbness.   10-point ROS otherwise negative.  ____________________________________________   PHYSICAL  EXAM:  VITAL SIGNS: ED Triage Vitals  Enc Vitals Group     BP 02/18/15 1510 160/105 mmHg     Pulse Rate 02/18/15 1510 107     Resp 02/18/15 1510 16     Temp 02/18/15 1510 98.2 F (36.8 C)     Temp Source 02/18/15 1510 Oral     SpO2 02/18/15 1510 94 %     Weight --      Height --      Head Cir --      Peak Flow --      Pain Score 02/18/15 1511 9   Constitutional: Alert and oriented. Well appearing and in no distress. Eyes: Conjunctivae are normal. PERRL. Normal extraocular movements. ENT   Head: Normocephalic and atraumatic.   Nose: No congestion/rhinnorhea.   Mouth/Throat: Mucous membranes are moist.   Neck: No stridor. Hematological/Lymphatic/Immunilogical: No cervical lymphadenopathy. Cardiovascular: Normal rate, regular rhythm.  No murmurs, rubs, or gallops. Respiratory: Normal respiratory effort without tachypnea nor retractions. Breath sounds are clear and equal bilaterally. No wheezes/rales/rhonchi. Gastrointestinal: Soft and nontender. No distention. There is no CVA tenderness. Genitourinary: Deferred Musculoskeletal: Normal range of motion in all extremities. No joint effusions.  No lower extremity tenderness nor edema. Neurologic:  Normal speech and language. No gross focal neurologic deficits are appreciated. Speech is normal.  Skin:  Skin is warm, dry and intact. No rash noted. Psychiatric: Mood and affect are normal. Speech and behavior are normal. Patient exhibits appropriate insight and judgment.  ____________________________________________    LABS (pertinent positives/negatives)  Labs Reviewed  COMPREHENSIVE METABOLIC PANEL - Abnormal; Notable for the following:    Potassium 3.2 (*)    Glucose, Bld 155 (*)  Creatinine, Ser 1.81 (*)    Albumin 3.4 (*)    AST 48 (*)    GFR calc non Af Amer 40 (*)    GFR calc Af Amer 46 (*)    All other components within normal limits  C DIFFICILE QUICK SCAN W PCR REFLEX  CBC WITH DIFFERENTIAL/PLATELET   LIPASE, BLOOD    ____________________________________________   EKG  None  ____________________________________________    RADIOLOGY  None  ____________________________________________   PROCEDURES  Procedure(s) performed: None  Critical Care performed: No  ____________________________________________   INITIAL IMPRESSION / ASSESSMENT AND PLAN / ED COURSE  Pertinent labs & imaging results that were available during my care of the patient were reviewed by me and considered in my medical decision making (see chart for details).  Patient presented to the emergency department today with concerns for rectal bleeding and abdominal pain. On exam patient without any significant abdominal tenderness. Patient does state this reminds him of the previous time he had C. Difficile. Will plan on getting blood work and the C. difficile stool sample.  I was informed by nursing staff that the patient chose to leave. I was not able to discuss with him MRSA benefits prior to his departing the emergency department.  ____________________________________________   FINAL CLINICAL IMPRESSION(S) / ED DIAGNOSES  Final diagnoses:  Abdominal pain, unspecified abdominal location     Nance Pear, MD 02/18/15 1940

## 2015-02-18 NOTE — ED Notes (Addendum)
Pt reports rectal bleeding starting today with abdominal pain. Bright red blood. Diarrhea this am. No fever. No pain with urination. States has had this before and was diagnosed with infection in intestines.

## 2015-02-18 NOTE — ED Notes (Signed)
Informed rt of need for stool sample.  Pt sts he is unable to give a sample at this time.

## 2015-12-04 ENCOUNTER — Encounter
Admission: RE | Admit: 2015-12-04 | Discharge: 2015-12-04 | Disposition: A | Discharge status: Home / Self Care | Payer: Medicare Other | Source: Ambulatory Visit | Attending: Internal Medicine | Admitting: Internal Medicine

## 2015-12-10 ENCOUNTER — Encounter
Admission: RE | Admit: 2015-12-10 | Discharge: 2015-12-10 | Disposition: A | Discharge status: Home / Self Care | Payer: Medicare Other | Source: Ambulatory Visit | Attending: Internal Medicine | Admitting: Internal Medicine

## 2015-12-10 ENCOUNTER — Other Ambulatory Visit
Admission: RE | Admit: 2015-12-10 | Discharge: 2015-12-10 | Disposition: A | Discharge status: Home / Self Care | Payer: Medicare Other | Source: Skilled Nursing Facility | Attending: Internal Medicine | Admitting: Internal Medicine

## 2015-12-10 DIAGNOSIS — E876 Hypokalemia: Secondary | ICD-10-CM | POA: Diagnosis present

## 2015-12-10 LAB — BASIC METABOLIC PANEL
ANION GAP: 10 (ref 5–15)
BUN: 36 mg/dL — ABNORMAL HIGH (ref 6–20)
CHLORIDE: 94 mmol/L — AB (ref 101–111)
CO2: 31 mmol/L (ref 22–32)
Calcium: 9.1 mg/dL (ref 8.9–10.3)
Creatinine, Ser: 2.22 mg/dL — ABNORMAL HIGH (ref 0.61–1.24)
GFR calc non Af Amer: 31 mL/min — ABNORMAL LOW (ref >60)
GFR, EST AFRICAN AMERICAN: 36 mL/min — AB (ref >60)
Glucose, Bld: 117 mg/dL — ABNORMAL HIGH (ref 65–99)
POTASSIUM: 3.4 mmol/L — AB (ref 3.5–5.1)
Sodium: 135 mmol/L (ref 135–145)

## 2016-01-17 ENCOUNTER — Encounter: Payer: Self-pay | Admitting: Emergency Medicine

## 2016-01-17 ENCOUNTER — Observation Stay
Admission: EM | Admit: 2016-01-17 | Discharge: 2016-01-18 | Disposition: A | Discharge status: Home / Self Care | Payer: Medicare Other | Attending: Internal Medicine | Admitting: Internal Medicine

## 2016-01-17 ENCOUNTER — Emergency Department: Payer: Medicare Other

## 2016-01-17 DIAGNOSIS — E785 Hyperlipidemia, unspecified: Secondary | ICD-10-CM | POA: Diagnosis not present

## 2016-01-17 DIAGNOSIS — Z79899 Other long term (current) drug therapy: Secondary | ICD-10-CM | POA: Insufficient documentation

## 2016-01-17 DIAGNOSIS — Z8249 Family history of ischemic heart disease and other diseases of the circulatory system: Secondary | ICD-10-CM | POA: Diagnosis not present

## 2016-01-17 DIAGNOSIS — E876 Hypokalemia: Secondary | ICD-10-CM | POA: Diagnosis not present

## 2016-01-17 DIAGNOSIS — Z7982 Long term (current) use of aspirin: Secondary | ICD-10-CM | POA: Insufficient documentation

## 2016-01-17 DIAGNOSIS — R7989 Other specified abnormal findings of blood chemistry: Secondary | ICD-10-CM

## 2016-01-17 DIAGNOSIS — N289 Disorder of kidney and ureter, unspecified: Secondary | ICD-10-CM | POA: Diagnosis present

## 2016-01-17 DIAGNOSIS — R200 Anesthesia of skin: Secondary | ICD-10-CM | POA: Insufficient documentation

## 2016-01-17 DIAGNOSIS — F17210 Nicotine dependence, cigarettes, uncomplicated: Secondary | ICD-10-CM | POA: Insufficient documentation

## 2016-01-17 DIAGNOSIS — R531 Weakness: Principal | ICD-10-CM | POA: Insufficient documentation

## 2016-01-17 DIAGNOSIS — B1920 Unspecified viral hepatitis C without hepatic coma: Secondary | ICD-10-CM | POA: Insufficient documentation

## 2016-01-17 DIAGNOSIS — R51 Headache: Secondary | ICD-10-CM | POA: Diagnosis not present

## 2016-01-17 DIAGNOSIS — I081 Rheumatic disorders of both mitral and tricuspid valves: Secondary | ICD-10-CM | POA: Insufficient documentation

## 2016-01-17 DIAGNOSIS — N189 Chronic kidney disease, unspecified: Secondary | ICD-10-CM | POA: Diagnosis present

## 2016-01-17 DIAGNOSIS — R079 Chest pain, unspecified: Secondary | ICD-10-CM | POA: Insufficient documentation

## 2016-01-17 DIAGNOSIS — R778 Other specified abnormalities of plasma proteins: Secondary | ICD-10-CM

## 2016-01-17 DIAGNOSIS — Z96649 Presence of unspecified artificial hip joint: Secondary | ICD-10-CM | POA: Insufficient documentation

## 2016-01-17 DIAGNOSIS — Z888 Allergy status to other drugs, medicaments and biological substances status: Secondary | ICD-10-CM | POA: Diagnosis not present

## 2016-01-17 DIAGNOSIS — I639 Cerebral infarction, unspecified: Secondary | ICD-10-CM

## 2016-01-17 DIAGNOSIS — I129 Hypertensive chronic kidney disease with stage 1 through stage 4 chronic kidney disease, or unspecified chronic kidney disease: Secondary | ICD-10-CM | POA: Insufficient documentation

## 2016-01-17 LAB — URINALYSIS COMPLETE WITH MICROSCOPIC (ARMC ONLY)
BACTERIA UA: NONE SEEN
BILIRUBIN URINE: NEGATIVE
GLUCOSE, UA: NEGATIVE mg/dL
Hgb urine dipstick: NEGATIVE
Ketones, ur: NEGATIVE mg/dL
Leukocytes, UA: NEGATIVE
Nitrite: NEGATIVE
PH: 6 (ref 5.0–8.0)
Protein, ur: 100 mg/dL — AB
SQUAMOUS EPITHELIAL / LPF: NONE SEEN
Specific Gravity, Urine: 1.009 (ref 1.005–1.030)

## 2016-01-17 LAB — COMPREHENSIVE METABOLIC PANEL
ALK PHOS: 61 U/L (ref 38–126)
ALT: 29 U/L (ref 17–63)
AST: 34 U/L (ref 15–41)
Albumin: 3.3 g/dL — ABNORMAL LOW (ref 3.5–5.0)
Anion gap: 8 (ref 5–15)
BILIRUBIN TOTAL: 0.4 mg/dL (ref 0.3–1.2)
BUN: 27 mg/dL — AB (ref 6–20)
CO2: 28 mmol/L (ref 22–32)
CREATININE: 2.45 mg/dL — AB (ref 0.61–1.24)
Calcium: 8.6 mg/dL — ABNORMAL LOW (ref 8.9–10.3)
Chloride: 102 mmol/L (ref 101–111)
GFR calc Af Amer: 32 mL/min — ABNORMAL LOW (ref >60)
GFR, EST NON AFRICAN AMERICAN: 28 mL/min — AB (ref >60)
GLUCOSE: 98 mg/dL (ref 65–99)
Potassium: 3 mmol/L — ABNORMAL LOW (ref 3.5–5.1)
Sodium: 138 mmol/L (ref 135–145)
TOTAL PROTEIN: 6.6 g/dL (ref 6.5–8.1)

## 2016-01-17 LAB — CBC WITH DIFFERENTIAL/PLATELET
BASOS ABS: 0.1 10*3/uL (ref 0–0.1)
Basophils Relative: 1 %
Eosinophils Absolute: 0.1 10*3/uL (ref 0–0.7)
Eosinophils Relative: 2 %
HEMATOCRIT: 34 % — AB (ref 40.0–52.0)
HEMOGLOBIN: 11.8 g/dL — AB (ref 13.0–18.0)
LYMPHS PCT: 39 %
Lymphs Abs: 3.4 10*3/uL (ref 1.0–3.6)
MCH: 28.9 pg (ref 26.0–34.0)
MCHC: 34.7 g/dL (ref 32.0–36.0)
MCV: 83.2 fL (ref 80.0–100.0)
MONO ABS: 0.8 10*3/uL (ref 0.2–1.0)
Monocytes Relative: 9 %
NEUTROS ABS: 4.2 10*3/uL (ref 1.4–6.5)
NEUTROS PCT: 49 %
Platelets: 154 10*3/uL (ref 150–440)
RBC: 4.09 MIL/uL — AB (ref 4.40–5.90)
RDW: 14.4 % (ref 11.5–14.5)
WBC: 8.6 10*3/uL (ref 3.8–10.6)

## 2016-01-17 LAB — TROPONIN I: TROPONIN I: 0.06 ng/mL — AB (ref <0.03)

## 2016-01-17 MED ORDER — MORPHINE SULFATE (PF) 4 MG/ML IV SOLN
4.0000 mg | Freq: Once | INTRAVENOUS | Status: AC
Start: 2016-01-17 — End: 2016-01-17
  Administered 2016-01-17: 4 mg via INTRAVENOUS
  Filled 2016-01-17: qty 1

## 2016-01-17 MED ORDER — SODIUM CHLORIDE 0.9 % IV SOLN
1000.0000 mL | Freq: Once | INTRAVENOUS | Status: AC
  Administered 2016-01-17: 1000 mL via INTRAVENOUS

## 2016-01-17 MED ORDER — ASPIRIN 81 MG PO CHEW
324.0000 mg | CHEWABLE_TABLET | Freq: Once | ORAL | Status: AC
  Administered 2016-01-17: 324 mg via ORAL
  Filled 2016-01-17: qty 4

## 2016-01-17 NOTE — ED Notes (Signed)
Pt to ED via POV for left side numbness and cp that started this morning after breakfast. Pt also c/o a "weird" headache to frontal lobe. Pt denies any vision changes. Pt has some facial droop to right side. Pt  A&O

## 2016-01-17 NOTE — ED Provider Notes (Signed)
[facility] Emergency Department Provider Note        Time seen: ----------------------------------------- 10:22 PM on 01/17/2016 -----------------------------------------    I have reviewed the triage vital signs and the nursing notes.   HISTORY  Chief Complaint Numbness and Chest Pain    HPI [name] is a 60 y.o. male who presents to ER for left-sided numbness and chest pain that started this morning after breakfast. Patient states he's had trouble walking, he feels weaker on the left side and he is having worsening numbness throughout the day. Patient also complains "weird frontal headache, denies any vision changes, denies trouble breathing or swallowing. He has not had these symptoms before. Nothing is made it better.   Past Medical History  Diagnosis Date  . Hypertension   . Renal disorder     Patient Active Problem List   Diagnosis Date Noted  . HEPATITIS C 06/28/2008  . HYPERLIPIDEMIA 06/28/2008  . SUBSTANCE ABUSE, MULTIPLE 06/28/2008  . HYPERTENSION 06/27/2008    No past surgical history on file.  Allergies Cyclobenzaprine and Tylenol  Social History Social History  Substance Use Topics  . Smoking status: Current Every Day Smoker  . Smokeless tobacco: Not on file  . Alcohol Use: No    Review of Systems Constitutional: Negative for fever. Eyes: Negative for visual changes. ENT: Negative for sore throat. Cardiovascular: Positive for chest pain Respiratory: Negative for shortness of breath. Gastrointestinal: Negative for abdominal pain, vomiting and diarrhea. Genitourinary: Negative for dysuria. Musculoskeletal: Negative for back pain. Skin: Negative for rash. Neurological: Positive for headache, left-sided weakness and numbness  10-point ROS otherwise negative.  ____________________________________________   PHYSICAL EXAM:  VITAL SIGNS: ED Triage Vitals  Enc Vitals Group     BP 01/17/16 2221 157/107 mmHg      Pulse Rate 01/17/16 2221 86     Resp 01/17/16 2221 20     Temp --      Temp src --      SpO2 --      Weight --      Height --      Head Cir --      Peak Flow --      Pain Score 01/17/16 2221 9     Pain Loc --      Pain Edu? --      Excl. in [place]? --    Constitutional: Alert and oriented. No acute distress Eyes: Conjunctivae are normal. PERRL. Normal extraocular movements. ENT   Head: Normocephalic and atraumatic.   Nose: No congestion/rhinnorhea.   Mouth/Throat: Mucous membranes are moist.   Neck: No stridor. Cardiovascular: Normal rate, regular rhythm. No murmurs, rubs, or gallops. Respiratory: Normal respiratory effort without tachypnea nor retractions. Breath sounds are clear and equal bilaterally. No wheezes/rales/rhonchi. Gastrointestinal: Soft and nontender. Normal bowel sounds Musculoskeletal: Nontender with normal range of motion in all extremities. No lower extremity tenderness nor edema. Neurologic:  Normal speech and language. Left sided facial drooping, decreased sensation in her left arm and left leg compared to right. No pronator drift. Skin:  Skin is warm, dry and intact. No rash noted. Psychiatric: Mood and affect are normal. Speech and behavior are normal.  ____________________________________________  EKG: Interpreted by me. Normal sinus rhythm with a rate of 86 bpm, sinus arrhythmia, left axis deviation, normal PR interval, normal QRS size, normal QT interval. No evidence of acute infarction  ____________________________________________  ED COURSE:  Pertinent labs & imaging results that were available during my care of  the patient were reviewed by me and considered in my medical decision making (see chart for details). Patient is in no acute distress but presents with acute neurologic symptoms that began this morning approximately greater than 12 hours ago. We will check basic labs and CT imaging. ____________________________________________     LABS (pertinent positives/negatives)  Labs Reviewed  CBC WITH DIFFERENTIAL/PLATELET - Abnormal; Notable for the following:    RBC 4.09 (*)    Hemoglobin 11.8 (*)    HCT 34.0 (*)    All other components within normal limits  COMPREHENSIVE METABOLIC PANEL - Abnormal; Notable for the following:    Potassium 3.0 (*)    BUN 27 (*)    Creatinine, Ser 2.45 (*)    Calcium 8.6 (*)    Albumin 3.3 (*)    GFR calc non Af Amer 28 (*)    GFR calc Af Amer 32 (*)    All other components within normal limits  TROPONIN I - Abnormal; Notable for the following:    Troponin I 0.06 (*)    All other components within normal limits  URINALYSIS COMPLETEWITH MICROSCOPIC ([facility] ONLY)  CBG MONITORING, ED    RADIOLOGY Images were viewed by me  CT head Is unremarkable ____________________________________________  FINAL ASSESSMENT AND PLAN  CVA, acute on chronic renal insufficiency, chronically elevated troponin level  Plan: Patient with labs and imaging as dictated above. Patient presents primarily with left-sided weakness and numbness. Patient does have acute on chronic renal insufficiency and will receive IV fluid while in the ER. Clinically findings are concerning for CVA that happened this morning. Patient will receive an adult aspirin. He is stable for admission.   Earleen Newport, MD   Note: This dictation was prepared with Dragon dictation. Any transcriptional errors that result from this process are unintentional   Earleen Newport, MD 01/17/16 [id number]

## 2016-01-17 NOTE — ED Notes (Signed)
Patient transported to CT 

## 2016-01-18 ENCOUNTER — Observation Stay: Payer: Medicare Other

## 2016-01-18 ENCOUNTER — Observation Stay
Admit: 2016-01-18 | Discharge: 2016-01-18 | Disposition: A | Discharge status: Home / Self Care | Payer: Medicare Other | Attending: Family Medicine | Admitting: Family Medicine

## 2016-01-18 DIAGNOSIS — R531 Weakness: Secondary | ICD-10-CM | POA: Diagnosis present

## 2016-01-18 LAB — CBC
HCT: 33.6 % — ABNORMAL LOW (ref 40.0–52.0)
HEMOGLOBIN: 11.7 g/dL — AB (ref 13.0–18.0)
MCH: 29.2 pg (ref 26.0–34.0)
MCHC: 34.8 g/dL (ref 32.0–36.0)
MCV: 83.7 fL (ref 80.0–100.0)
Platelets: 145 10*3/uL — ABNORMAL LOW (ref 150–440)
RBC: 4.01 MIL/uL — AB (ref 4.40–5.90)
RDW: 14.5 % (ref 11.5–14.5)
WBC: 7.5 10*3/uL (ref 3.8–10.6)

## 2016-01-18 LAB — BASIC METABOLIC PANEL
ANION GAP: 7 (ref 5–15)
BUN: 24 mg/dL — ABNORMAL HIGH (ref 6–20)
CALCIUM: 8.5 mg/dL — AB (ref 8.9–10.3)
CO2: 29 mmol/L (ref 22–32)
Chloride: 103 mmol/L (ref 101–111)
Creatinine, Ser: 2.34 mg/dL — ABNORMAL HIGH (ref 0.61–1.24)
GFR, EST AFRICAN AMERICAN: 34 mL/min — AB (ref >60)
GFR, EST NON AFRICAN AMERICAN: 29 mL/min — AB (ref >60)
Glucose, Bld: 101 mg/dL — ABNORMAL HIGH (ref 65–99)
Potassium: 2.8 mmol/L — ABNORMAL LOW (ref 3.5–5.1)
Sodium: 139 mmol/L (ref 135–145)

## 2016-01-18 LAB — BLOOD CULTURE ID PANEL (REFLEXED)
Acinetobacter baumannii: NOT DETECTED
CANDIDA ALBICANS: NOT DETECTED
CANDIDA PARAPSILOSIS: NOT DETECTED
CANDIDA TROPICALIS: NOT DETECTED
CARBAPENEM RESISTANCE: NOT DETECTED
Candida glabrata: NOT DETECTED
Candida krusei: NOT DETECTED
ENTEROCOCCUS SPECIES: NOT DETECTED
Enterobacter cloacae complex: NOT DETECTED
Enterobacteriaceae species: NOT DETECTED
Escherichia coli: NOT DETECTED
HAEMOPHILUS INFLUENZAE: NOT DETECTED
KLEBSIELLA PNEUMONIAE: NOT DETECTED
Klebsiella oxytoca: NOT DETECTED
LISTERIA MONOCYTOGENES: NOT DETECTED
METHICILLIN RESISTANCE: NOT DETECTED
Neisseria meningitidis: NOT DETECTED
PROTEUS SPECIES: NOT DETECTED
Pseudomonas aeruginosa: NOT DETECTED
SERRATIA MARCESCENS: NOT DETECTED
STAPHYLOCOCCUS AUREUS BCID: NOT DETECTED
STAPHYLOCOCCUS SPECIES: DETECTED — AB
STREPTOCOCCUS PNEUMONIAE: NOT DETECTED
STREPTOCOCCUS PYOGENES: NOT DETECTED
Streptococcus agalactiae: NOT DETECTED
Streptococcus species: NOT DETECTED
VANCOMYCIN RESISTANCE: NOT DETECTED

## 2016-01-18 LAB — LIPID PANEL
Cholesterol: 134 mg/dL (ref 0–200)
HDL: 39 mg/dL — ABNORMAL LOW (ref >40)
LDL CALC: 80 mg/dL (ref 0–99)
TRIGLYCERIDES: 74 mg/dL (ref <150)
Total CHOL/HDL Ratio: 3.4 RATIO
VLDL: 15 mg/dL (ref 0–40)

## 2016-01-18 LAB — ECHOCARDIOGRAM COMPLETE
HEIGHTINCHES: 72 in
Weight: 4368 oz

## 2016-01-18 LAB — TROPONIN I
TROPONIN I: 0.05 ng/mL — AB (ref <0.03)
TROPONIN I: 0.05 ng/mL — AB (ref <0.03)
TROPONIN I: 0.05 ng/mL — AB (ref <0.03)

## 2016-01-18 LAB — URINE DRUG SCREEN, QUALITATIVE (ARMC ONLY)
Amphetamines, Ur Screen: NOT DETECTED
BARBITURATES, UR SCREEN: NOT DETECTED
BENZODIAZEPINE, UR SCRN: NOT DETECTED
COCAINE METABOLITE, UR ~~LOC~~: NOT DETECTED
Cannabinoid 50 Ng, Ur ~~LOC~~: NOT DETECTED
MDMA (Ecstasy)Ur Screen: NOT DETECTED
Methadone Scn, Ur: NOT DETECTED
OPIATE, UR SCREEN: NOT DETECTED
PHENCYCLIDINE (PCP) UR S: NOT DETECTED
Tricyclic, Ur Screen: NOT DETECTED

## 2016-01-18 LAB — TSH: TSH: 1.203 u[IU]/mL (ref 0.350–4.500)

## 2016-01-18 LAB — LACTIC ACID, PLASMA: LACTIC ACID, VENOUS: 1.2 mmol/L (ref 0.5–1.9)

## 2016-01-18 MED ORDER — TAMSULOSIN HCL 0.4 MG PO CAPS
0.4000 mg | ORAL_CAPSULE | Freq: Every day | ORAL | Status: DC
  Administered 2016-01-18: 11:00:00 0.4 mg via ORAL
  Filled 2016-01-18: qty 1

## 2016-01-18 MED ORDER — ONDANSETRON HCL 4 MG/2ML IJ SOLN: 4.0000 mg | Freq: Four times a day (QID) | INTRAMUSCULAR | Status: DC | PRN

## 2016-01-18 MED ORDER — HYDROCHLOROTHIAZIDE 25 MG PO TABS
25.0000 mg | ORAL_TABLET | Freq: Every day | ORAL | Status: DC
  Administered 2016-01-18: 25 mg via ORAL
  Filled 2016-01-18: qty 1

## 2016-01-18 MED ORDER — NICOTINE 14 MG/24HR TD PT24
14.0000 mg | MEDICATED_PATCH | Freq: Every day | TRANSDERMAL | Status: DC
  Administered 2016-01-18: 14 mg via TRANSDERMAL
  Filled 2016-01-18: qty 1

## 2016-01-18 MED ORDER — METOPROLOL TARTRATE 5 MG/5ML IV SOLN: 5.0000 mg | INTRAVENOUS | Status: DC | PRN

## 2016-01-18 MED ORDER — HYDRALAZINE HCL 100 MG PO TABS: 100.0000 mg | ORAL_TABLET | Freq: Three times a day (TID) | ORAL | Status: DC

## 2016-01-18 MED ORDER — ENOXAPARIN SODIUM 40 MG/0.4ML ~~LOC~~ SOLN
40.0000 mg | Freq: Every day | SUBCUTANEOUS | Status: DC
  Administered 2016-01-18: 40 mg via SUBCUTANEOUS
  Filled 2016-01-18: qty 0.4

## 2016-01-18 MED ORDER — OXYCODONE HCL ER 20 MG PO T12A
20.0000 mg | EXTENDED_RELEASE_TABLET | Freq: Two times a day (BID) | ORAL | Status: DC
  Administered 2016-01-18 (×2): 20 mg via ORAL
  Filled 2016-01-18 (×2): qty 1

## 2016-01-18 MED ORDER — ONDANSETRON HCL 4 MG PO TABS: 4.0000 mg | ORAL_TABLET | Freq: Four times a day (QID) | ORAL | Status: DC | PRN

## 2016-01-18 MED ORDER — STROKE: EARLY STAGES OF RECOVERY BOOK
Freq: Once | Status: AC
  Administered 2016-01-18: 10:00:00

## 2016-01-18 MED ORDER — ACETAMINOPHEN 650 MG RE SUPP: 650.0000 mg | Freq: Four times a day (QID) | RECTAL | Status: DC | PRN

## 2016-01-18 MED ORDER — TRAMADOL HCL 50 MG PO TABS: 50.0000 mg | ORAL_TABLET | Freq: Four times a day (QID) | ORAL | Status: DC | PRN

## 2016-01-18 MED ORDER — POLYETHYLENE GLYCOL 3350 17 G PO PACK: 17.0000 g | PACK | ORAL | Status: DC | PRN

## 2016-01-18 MED ORDER — ACETAMINOPHEN 325 MG PO TABS
650.0000 mg | ORAL_TABLET | Freq: Four times a day (QID) | ORAL | Status: DC | PRN
Start: 2016-01-18 — End: 2016-01-18
  Administered 2016-01-18: 03:00:00 650 mg via ORAL
  Filled 2016-01-18: qty 2

## 2016-01-18 MED ORDER — POTASSIUM CHLORIDE CRYS ER 20 MEQ PO TBCR
20.0000 meq | EXTENDED_RELEASE_TABLET | Freq: Once | ORAL | Status: AC
  Administered 2016-01-18: 20 meq via ORAL
  Filled 2016-01-18: qty 1

## 2016-01-18 MED ORDER — ATORVASTATIN CALCIUM 20 MG PO TABS
40.0000 mg | ORAL_TABLET | Freq: Every day | ORAL | Status: DC
  Administered 2016-01-18: 40 mg via ORAL
  Filled 2016-01-18: qty 2

## 2016-01-18 MED ORDER — ASPIRIN 325 MG PO TABS
325.0000 mg | ORAL_TABLET | Freq: Every day | ORAL | Status: DC
  Administered 2016-01-18: 325 mg via ORAL
  Filled 2016-01-18: qty 1

## 2016-01-18 MED ORDER — OXYCODONE-ACETAMINOPHEN 5-325 MG PO TABS
2.0000 | ORAL_TABLET | ORAL | Status: DC | PRN
  Administered 2016-01-18: 08:00:00 2 via ORAL
  Filled 2016-01-18: qty 2

## 2016-01-18 MED ORDER — SENNOSIDES-DOCUSATE SODIUM 8.6-50 MG PO TABS: 1.0000 | ORAL_TABLET | Freq: Every evening | ORAL | Status: DC | PRN

## 2016-01-18 MED ORDER — FAMOTIDINE 20 MG PO TABS
20.0000 mg | ORAL_TABLET | Freq: Two times a day (BID) | ORAL | Status: DC
  Administered 2016-01-18: 20 mg via ORAL
  Filled 2016-01-18: qty 1

## 2016-01-18 MED ORDER — MORPHINE SULFATE (PF) 2 MG/ML IV SOLN
1.0000 mg | Freq: Once | INTRAVENOUS | Status: AC
  Administered 2016-01-18: 1 mg via INTRAVENOUS
  Filled 2016-01-18: qty 1

## 2016-01-18 MED ORDER — ALUM & MAG HYDROXIDE-SIMETH 200-200-20 MG/5ML PO SUSP: 15.0000 mL | ORAL | Status: DC | PRN

## 2016-01-18 MED ORDER — OXYCODONE HCL 5 MG PO TABS
10.0000 mg | ORAL_TABLET | ORAL | Status: DC | PRN
  Administered 2016-01-18: 10 mg via ORAL
  Filled 2016-01-18: qty 2

## 2016-01-18 MED ORDER — HYDRALAZINE HCL 10 MG PO TABS
10.0000 mg | ORAL_TABLET | Freq: Three times a day (TID) | ORAL | Status: DC
  Administered 2016-01-18: 10 mg via ORAL
  Filled 2016-01-18 (×3): qty 1

## 2016-01-18 MED ORDER — AMLODIPINE BESYLATE 10 MG PO TABS
10.0000 mg | ORAL_TABLET | Freq: Every day | ORAL | Status: DC
  Administered 2016-01-18: 11:00:00 10 mg via ORAL
  Filled 2016-01-18: qty 1

## 2016-01-18 MED ORDER — POTASSIUM CHLORIDE CRYS ER 20 MEQ PO TBCR
40.0000 meq | EXTENDED_RELEASE_TABLET | Freq: Once | ORAL | Status: AC
  Administered 2016-01-18: 03:00:00 40 meq via ORAL
  Filled 2016-01-18: qty 2

## 2016-01-18 MED ORDER — IPRATROPIUM-ALBUTEROL 0.5-2.5 (3) MG/3ML IN SOLN: 3.0000 mL | RESPIRATORY_TRACT | Status: DC | PRN

## 2016-01-18 NOTE — Care Management (Signed)
Admitted to [facility] was seen [facility] in the home. [facility] under observation status with the diagnosis of left sided weakness. Lives with father and mother. Father is Evelena Peat [phone]). Home Health through [facility] 2 months ago. [facility] for skilled nursing about a month ago. No home oxygen. Takes care of all basic and instrumental activities of daily living himself, drives. No falls. Good appetite. Prescriptions are filled at [facility] or [facility]. Brother will transport. Shelbie Ammons RN MSN CCM Care management [phone]

## 2016-01-18 NOTE — Care Management Obs Status (Signed)
[facility] NOTIFICATION   Patient Details  Name: [name] MRN: [id number] Date of Birth: 23-Mar-1956   Medicare Observation Status Notification Given:  Yes    Shelbie Ammons, RN 01/18/2016, 11:37 AM

## 2016-01-18 NOTE — H&P (Signed)
PCP:   [facility] PRIMARY CARE   Chief Complaint:  Left-sided weakness, headache, chest pains  HPI: This a 60 year old gentleman who states that he woke up this morning just not feeling good. He went back to bed and spent most of the morning in the bed. He had left-sided weakness in the arms and the legs as well as numbness. This persisted all day. He states he still has some of its current in the ER. He also complains of a headache that is generalized. He reports some photophobia. Denies any history of migraines. He denies any altered mentation. He denies slurred speech. He states he has some mild nausea but no vomiting or diarrhea. He denies any abdominal pain. He denies GERD or fever. He also reports that he has chest pains. This essentially located and in the epigastric region. He denies any shortness of breath or palpitations. He finally came to the ER approximately 12 hours after his symptoms began. In the ER the patient does appear uncomfortable.  Review of Systems:  The patient denies anorexia, fever, weight loss, headache, vision loss, decreased hearing, hoarseness, chest pain, syncope, dyspnea on exertion, peripheral edema, balance deficits, hemoptysis, abdominal pain, melena, nausea, vomiting, hematochezia, severe indigestion/heartburn, hematuria, incontinence, genital sores, muscle weakness, suspicious skin lesions, transient blindness, difficulty walking, depression, unusual weight change, abnormal bleeding, enlarged lymph nodes, angioedema, and breast masses.  Past Medical History: Past Medical History  Diagnosis Date  . Hypertension   . Renal disorder    Past Surgical History  Procedure Laterality Date  . Total hip arthroplasty      Medications: Prior to Admission medications   Medication Sig Start Date End Date Taking? Authorizing Provider  amLODipine (NORVASC) 10 MG tablet Take 10 mg by mouth daily. 11/27/15  Yes Historical Provider, MD  atorvastatin (LIPITOR) 40 MG tablet Take  40 mg by mouth daily. 11/27/15  Yes Historical Provider, MD  hydrALAZINE (APRESOLINE) 100 MG tablet Take 100 mg by mouth 2 (two) times daily. 11/27/15  Yes Historical Provider, MD  hydrochlorothiazide (HYDRODIURIL) 25 MG tablet Take 25 mg by mouth daily. 11/27/15  Yes Historical Provider, MD  Oxycodone HCl 10 MG TABS Take 10 mg by mouth every 4 (four) hours as needed. 12/21/15  Yes Historical Provider, MD  OXYCONTIN 20 MG 12 hr tablet Take 1 tablet by mouth 2 (two) times daily. 11/27/15  Yes Historical Provider, MD  polyethylene glycol (MIRALAX / GLYCOLAX) packet Take 17 g by mouth as needed. 11/27/15  Yes Historical Provider, MD  tamsulosin (FLOMAX) 0.4 MG CAPS capsule Take 1 capsule by mouth daily. 11/03/15  Yes Historical Provider, MD  traZODone (DESYREL) 100 MG tablet Take 100 mg by mouth at bedtime as needed. 10/22/15  Yes Historical Provider, MD    Allergies:   Allergies  Allergen Reactions  . Cyclobenzaprine Nausea Only  . Clonidine Other (See Comments)    Asymptomatic bradycardia to 38  . Furosemide Other (See Comments)    Caused renal failure  . Tylenol [Acetaminophen] Other (See Comments)    Reaction:  Pt states it bothers his liver    Social History:  reports that he has been smoking Cigarettes.  He has been smoking about 0.50 packs per day. He does not have any smokeless tobacco history on file. He reports that he does not drink alcohol or use illicit drugs.  Family History: Hypertension  Physical Exam: Filed Vitals:   01/17/16 2326 01/17/16 2353 01/18/16 0000 01/18/16 0030  BP: 158/111 157/108 150/99 146/93  Pulse:  86 70 68 70  Temp:      TempSrc:      Resp: 18 15 20 16   SpO2: 99% 99% 99% 98%    General:  Alert and oriented times three, well developed and nourished, uncomfortable appearing gentleman Eyes: PERRLA, pink conjunctiva, no scleral icterus ENT: Moist oral mucosa, neck supple, no thyromegaly Lungs: clear to ascultation, no wheeze, no crackles, no use of  accessory muscles Cardiovascular: regular rate and rhythm, no regurgitation, no gallops, no murmurs. No carotid bruits, no JVD, reproducible chest wall pain Abdomen: soft, positive BS, non-tender, non-distended, no organomegaly, not an acute abdomen GU: not examined Neuro: CN II - XII grossly intact, sensation intact Musculoskeletal: strength 5/5 all extremities except left lower extremity which is 4.5/5, no clubbing, cyanosis or edema Skin: no rash, no subcutaneous crepitation, no decubitus Psych: appropriate patient   Labs on Admission:   Recent Labs  01/17/16 2227  NA 138  K 3.0*  CL 102  CO2 28  GLUCOSE 98  BUN 27*  CREATININE 2.45*  CALCIUM 8.6*    Recent Labs  01/17/16 2227  AST 34  ALT 29  ALKPHOS 61  BILITOT 0.4  PROT 6.6  ALBUMIN 3.3*   No results for input(s): LIPASE, AMYLASE in the last 72 hours.  Recent Labs  01/17/16 2227  WBC 8.6  NEUTROABS 4.2  HGB 11.8*  HCT 34.0*  MCV 83.2  PLT 154    Recent Labs  01/17/16 2227  TROPONINI 0.06*   Invalid input(s): POCBNP No results for input(s): DDIMER in the last 72 hours. No results for input(s): HGBA1C in the last 72 hours. No results for input(s): CHOL, HDL, LDLCALC, TRIG, CHOLHDL, LDLDIRECT in the last 72 hours. No results for input(s): TSH, T4TOTAL, T3FREE, THYROIDAB in the last 72 hours.  Invalid input(s): FREET3 No results for input(s): VITAMINB12, FOLATE, FERRITIN, TIBC, IRON, RETICCTPCT in the last 72 hours.  Micro Results: No results found for this or any previous visit (from the past 240 hour(s)).   Radiological Exams on Admission: Ct Head Wo Contrast  01/17/2016  CLINICAL DATA:  60 year old male with left-sided weakness and headache. EXAM: CT HEAD WITHOUT CONTRAST TECHNIQUE: Contiguous axial images were obtained from the base of the skull through the vertex without intravenous contrast. COMPARISON:  Head CT dated 03/10/2011 FINDINGS: There is mild age-related atrophy and chronic  microvascular ischemic changes. No acute intracranial hemorrhage. No mass effect or midline shift noted. The visualized paranasal sinuses and mastoid air cells are clear. The calvarium is intact. IMPRESSION: No acute intracranial hemorrhage. Mild age-related atrophy and chronic microvascular ischemic disease. If symptoms persist and there are no contraindications, MRI may provide better evaluation if clinically indicated Electronically Signed   By: Anner Crete M.D.   On: 01/17/2016 23:20    Assessment/Plan Present on Admission:  . Left-sided weakness -Patient with many nonspecific symptoms. Will bring patient in for observation status on med telemetry -Workup for TIA will be initiated. MRI head, 2-D echo, carotid ultrasound, lipid panel and daily aspirin. -Neurochecks every 4 hours for the next 24 hours  Chest pain -His chest pain is reproducible with palpation. But we'll cycle cardiac enzymes and monitor patient on telemetry. Patient on aspirin   Headache -MRI brain in the morning. Patient with multiple multitude of symptoms. Question could this be a viral etiology but blood cultures and urine cultures ordered. -Urine drug screen ordered  Hypokalemia -Will replete. We'll admit magnesium level . Repeat BMP in a.m.  Hepatitis C -  Patient states this is treated and in remission  Chronic kidney disease -At baseline  Mild Elevation troponin -On reviewing old labs this is patient's baseline. Likely due to patient's chronic kidney disease  Dyslipidemia -Stable, resume home medications   Tobacco abuse -Nicotine patch, duonebs as needed  Gradie Butrick 01/18/2016, 12:49 AM

## 2016-01-18 NOTE — ED Notes (Signed)
Dr. Crosley at bedside 

## 2016-01-18 NOTE — ED Notes (Signed)
Called lab about adding on UDS to urine sent down there earlier. They said they would run the test.

## 2016-01-18 NOTE — Progress Notes (Signed)
*  PRELIMINARY RESULTS* Echocardiogram 2D Echocardiogram has been performed.  Sherrie Sport 01/18/2016, 3:08 PM

## 2016-01-18 NOTE — Progress Notes (Signed)
MD notified Potassium is 2.8. Initial replacement given at time of blood draw. Additional 20 mEQ of oral Potassium ordered.

## 2016-01-18 NOTE — Progress Notes (Signed)
Received MD order to discharge patient to home, reviewed home meds, diet, prescriptions and discharge instructions with patient and patient verbalized discharge to home with nursing brother picked patient up

## 2016-01-18 NOTE — Discharge Instructions (Signed)
Analgesic Rebound Headaches An analgesic rebound headache is a headache that returns after pain medicine (analgesic) that was taken to treat the initial headache wears off. People who suffer from tension, migraine, or cluster headaches are at risk for developing rebound headaches. Any type of primary headache can return as a rebound headache if you regularly take analgesics more than three times a week. If the cycle of rebound headaches continues, they become chronic daily headaches.  CAUSES Analgesics frequently associated with this problem include common over-the-counter medicines like aspirin, ibuprofen, acetaminophen, sinus relief medicines, and other medicines that contain caffeine. Narcotic pain medicines are also a common cause of rebound headaches.  SIGNS AND SYMPTOMS The symptoms of rebound headaches are the same as the symptoms of your initial headache. Symptoms of specific types of headaches include: Tension headache  Pressure around the head.  Dull, aching head pain.  Pain felt over the front and sides of the head.  Tenderness in the muscles of the head, neck and shoulders. Migraine Headache  Pulsing or throbbing pain on one or both sides of the head.  Severe pain that interferes with daily activities.  Pain that is worsened by physical activity.  Nausea, vomiting, or both.  Pain with exposure to bright light, loud noises, or strong smells.  General sensitivity to bright light, loud noises, or strong smells.  Visual changes.  Numbness of one or both arms. Cluster Headaches  Severe pain that begins in or around one eye or temple.  Redness in the eye on the same side as the pain.  Droopy or swollen eyelid.  One-sided head pain.  Nausea.  Runny nose.  Sweaty, pale facial skin.  Restlessness. DIAGNOSIS  Analgesic rebound headaches are diagnosed by reviewing your medical history. This includes the nature of your initial headaches, as well as the type of pain  medicines you have been using to treat your headaches and how often you take them. TREATMENT Discontinuing frequent use of the analgesic medicine will typically reduce the frequency of the rebound episodes. This may initially worsen your headaches but eventually the pain should become more manageable, less frequent, and less severe.  Seeing a headache specialists may helpful. He or she may be able to help you manage your headaches and to make sure there is not another cause of the headaches. Alternative methods of stress relief such as acupuncture, counseling, biofeedback, and massage may also be helpful. Talk with your health care provider about which alternative treatments might be good for you. HOME CARE INSTRUCTIONS Stopping the regular use of pain medicine can be difficult. Follow your health care provider's instructions carefully. Keep all of your appointments. Avoid triggers that are known to cause your primary headaches. SEEK MEDICAL CARE IF: You continue to experience headaches after following your health care provider's recommended treatments. SEEK IMMEDIATE MEDICAL CARE IF:  You develop new headache pain.  You develop headache pain that is different than what you have experienced in the past.  You develop numbness or tingling in your arms or legs.  You develop changes in your speech or vision. MAKE SURE YOU:  Understand these instructions.  Will watch your child's condition.  Will get help right away if your child is not doing well or gets worse.   This information is not intended to replace advice given to you by your health care provider. Make sure you discuss any questions you have with your health care provider.   Document Released: 09/17/2003 Document Revised: 07/18/2014 Document Reviewed: 01/10/2013 Elsevier   Inter[employer] Patient Education 2016 [employer]  

## 2016-01-19 NOTE — Progress Notes (Signed)
PHARMACY - PHYSICIAN COMMUNICATION CRITICAL VALUE ALERT - BLOOD CULTURE IDENTIFICATION (BCID)  Patient is a 60 yo male discharged from [facility], [facility], [facility] on 7/10.  BCID resulted on 7/10 at 2200.  Previous RPh was instructed to contact patient's outpatient provider as cultures were staph species 1/2 bottles (possible contamination).   Contacted patient's PCP today at [phone] via phone and faxed results to [facility].   Murrell Converse, PharmD Clinical Pharmacist 01/19/2016

## 2016-01-20 NOTE — Discharge Summary (Signed)
[facility] [place] NAME: Schawn Tanksley    MR#:  [id number]  DATE OF BIRTH:  1956-01-05  DATE OF ADMISSION:  01/17/2016 ADMITTING PHYSICIAN: Quintella Baton, MD  DATE OF DISCHARGE: 01/18/2016  3:48 PM  PRIMARY CARE PHYSICIAN: [facility]    ADMISSION DIAGNOSIS:  Acute on chronic renal insufficiency (HCC) [N28.9, N18.9] Elevated troponin I level [R79.89] Cerebral infarction due to unspecified mechanism [I63.9]  DISCHARGE DIAGNOSIS:  Active Problems:   Left-sided weakness no TIA  SECONDARY DIAGNOSIS:   Past Medical History  Diagnosis Date  . Hypertension   . Renal disorder    HOSPITAL COURSE:  . Left-sided weakness - No TIA. Stroke work up neg - could be muscular  Chest pain -His chest pain is reproducible with palpation. Likely muscular  Headache Could be due to migraine  Hypokalemia -Repleted and Resolved  Hepatitis C -Patient states this is treated and in remission  Chronic kidney disease -At baseline  Mild Elevation troponin - supply demand ischemia  Dyslipidemia -Stable, resume home medications   Tobacco abuse -Nicotine patch, duonebs as needed  DISCHARGE CONDITIONS:   stable  CONSULTS OBTAINED:     DRUG ALLERGIES:   Allergies  Allergen Reactions  . Cyclobenzaprine Nausea Only  . Clonidine Other (See Comments)    Asymptomatic bradycardia to 38  . Furosemide Other (See Comments)    Caused renal failure  . Tylenol [Acetaminophen] Other (See Comments)    Reaction:  Pt states it bothers his liver    DISCHARGE MEDICATIONS:   Discharge Medication List as of 01/18/2016  3:35 PM    CONTINUE these medications which have CHANGED   Details  hydrALAZINE (APRESOLINE) 100 MG tablet Take 1 tablet (100 mg total) by mouth 3 (three) times daily., Starting 01/18/2016, Until Discontinued, Normal      CONTINUE these medications which have NOT CHANGED   Details  amLODipine (NORVASC) 10 MG  tablet Take 10 mg by mouth daily., Starting 11/27/2015, Until Discontinued, Historical Med    atorvastatin (LIPITOR) 40 MG tablet Take 40 mg by mouth daily., Starting 11/27/2015, Until Discontinued, Historical Med    hydrochlorothiazide (HYDRODIURIL) 25 MG tablet Take 25 mg by mouth daily., Starting 11/27/2015, Until Discontinued, Historical Med    Oxycodone HCl 10 MG TABS Take 10 mg by mouth every 4 (four) hours as needed., Starting 12/21/2015, Until Discontinued, Historical Med    OXYCONTIN 20 MG 12 hr tablet Take 1 tablet by mouth 2 (two) times daily., Starting 11/27/2015, Until Discontinued, Historical Med    polyethylene glycol (MIRALAX / GLYCOLAX) packet Take 17 g by mouth as needed., Starting 11/27/2015, Until Discontinued, Historical Med    tamsulosin (FLOMAX) 0.4 MG CAPS capsule Take 1 capsule by mouth daily., Starting 11/03/2015, Until Discontinued, Historical Med    traZODone (DESYREL) 100 MG tablet Take 100 mg by mouth at bedtime as needed., Starting 10/22/2015, Until Discontinued, Historical Med         DISCHARGE INSTRUCTIONS:    DIET:  Regular diet  DISCHARGE CONDITION:  Good  ACTIVITY:  Activity as tolerated  OXYGEN:  Home Oxygen: No.   Oxygen Delivery: room air  DISCHARGE LOCATION:  home   If you experience worsening of your admission symptoms, develop shortness of breath, life threatening emergency, suicidal or homicidal thoughts you must seek medical attention immediately by calling 911 or calling your MD immediately  if symptoms less severe.  You Must read complete instructions/literature along with all the possible  adverse reactions/side effects for all the Medicines you take and that have been prescribed to you. Take any new Medicines after you have completely understood and accpet all the possible adverse reactions/side effects.   Please note  You were cared for by a hospitalist during your hospital stay. If you have any questions about your discharge  medications or the care you received while you were in the hospital after you are discharged, you can call the unit and asked to speak with the hospitalist on call if the hospitalist that took care of you is not available. Once you are discharged, your primary care physician will handle any further medical issues. Please note that NO REFILLS for any discharge medications will be authorized once you are discharged, as it is imperative that you return to your primary care physician (or establish a relationship with a primary care physician if you do not have one) for your aftercare needs so that they can reassess your need for medications and monitor your lab values.    On the day of Discharge:  VITAL SIGNS:  Blood pressure 149/89, pulse 81, temperature 98.4 F (36.9 C), temperature source Oral, resp. rate 20, height 6' (1.829 m), weight 123.832 kg (273 lb), SpO2 99 %. PHYSICAL EXAMINATION:  GENERAL:  60 y.o.-year-old patient lying in the bed with no acute distress.  EYES: Pupils equal, round, reactive to light and accommodation. No scleral icterus. Extraocular muscles intact.  HEENT: Head atraumatic, normocephalic. Oropharynx and nasopharynx clear.  NECK:  Supple, no jugular venous distention. No thyroid enlargement, no tenderness.  LUNGS: Normal breath sounds bilaterally, no wheezing, rales,rhonchi or crepitation. No use of accessory muscles of respiration.  CARDIOVASCULAR: S1, S2 normal. No murmurs, rubs, or gallops.  ABDOMEN: Soft, non-tender, non-distended. Bowel sounds present. No organomegaly or mass.  EXTREMITIES: No pedal edema, cyanosis, or clubbing.  NEUROLOGIC: Cranial nerves II through XII are intact. Muscle strength 5/5 in all extremities. Sensation intact. Gait not checked.  PSYCHIATRIC: The patient is alert and oriented x 3.  SKIN: No obvious rash, lesion, or ulcer.  DATA REVIEW:   CBC  Recent Labs Lab 01/18/16 0232  WBC 7.5  HGB 11.7*  HCT 33.6*  PLT 145*     Chemistries   Recent Labs Lab 01/17/16 2227 01/18/16 0232  NA 138 139  K 3.0* 2.8*  CL 102 103  CO2 28 29  GLUCOSE 98 101*  BUN 27* 24*  CREATININE 2.45* 2.34*  CALCIUM 8.6* 8.5*  AST 34  --   ALT 29  --   ALKPHOS 61  --   BILITOT 0.4  --     RADIOLOGY:  Mr Brain Wo Contrast  01/18/2016  CLINICAL DATA:  60 year old man with left-sided weakness and numbness. Generalized headache with photophobia. EXAM: MRI HEAD WITHOUT CONTRAST TECHNIQUE: Multiplanar, multiecho pulse sequences of the brain and surrounding structures were obtained without intravenous contrast. COMPARISON:  None. FINDINGS: Brain Parenchyma: No focal diffusion restriction. There are old small right cerebellar infarcts. There is multifocal white matter hyperintensity compatible with chronic microvascular ischemia. Midline structures are unremarkable. No acute infarct or intraparenchymal hemorrhage. No mass lesion or midline shift. Ventricles, Sulci and Extra-axial Spaces: Normal for age. No extra-axial collection. Paranasal Sinuses and Mastoids: No fluid levels or advanced mucosal thickening. Orbits: Normal. Bones and Soft Tissues: Normal. IMPRESSION: 1. No acute infarct or other acute intracranial process. 2. 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 Electronically Signed   By: Cletus Gash.D.  On: 01/18/2016 10:43   US Carotid Bilateral  01/18/2016  CLINICAL DATA:  Left-sided weakness, hypertension, visual disturbance, smoker, coronary disease EXAM: BILATERAL CAROTID DUPLEX ULTRASOUND TECHNIQUE: Pearline Cables scale imaging, color Doppler and duplex ultrasound were performed of bilateral carotid and vertebral arteries in the neck. COMPARISON:  None. FINDINGS: Criteria: Quantification of carotid stenosis is based on velocity parameters that correlate the residual internal carotid diameter with NASCET-based stenosis levels, using the diameter of the distal internal carotid lumen as the  denominator for stenosis measurement. The following velocity measurements were obtained: RIGHT ICA:  62/29 cm/sec CCA:  [id number] cm/sec SYSTOLIC ICA/CCA RATIO:  0.9 DIASTOLIC ICA/CCA RATIO:  1.0 ECA:  72 cm/sec LEFT ICA:  63/33 cm/sec CCA:  123456 cm/sec SYSTOLIC ICA/CCA RATIO:  0.7 DIASTOLIC ICA/CCA RATIO:  1.3 ECA:  75 cm/sec RIGHT CAROTID ARTERY: No significant atherosclerotic plaque formation. No hemodynamically significant right ICA stenosis, velocity elevation, or turbulent flow. Degree of narrowing less than 50%. RIGHT VERTEBRAL ARTERY:  Antegrade LEFT CAROTID ARTERY: Minor atherosclerotic echogenic plaque formation. No hemodynamically significant left ICA stenosis, velocity elevation, or turbulent flow. LEFT VERTEBRAL ARTERY:  Antegrade IMPRESSION: Minor left carotid atherosclerosis. No hemodynamically significant ICA stenosis. Degree of narrowing is less than 50% bilaterally. Patent antegrade vertebral flow bilaterally Electronically Signed   By: Jerilynn Mages.  Shick M.D.   On: 01/18/2016 12:09     Management plans discussed with the patient, family and they are in agreement.  CODE STATUS: FULL CODE  TOTAL TIME TAKING CARE OF THIS PATIENT: 45 minutes.    [facility], Keta Vanvalkenburgh M.D on 01/20/2016 at 7:05 AM  Between 7am to 6pm - Pager - [phone]  After 6pm go to [email or web address] - password [email or web address] [facility] [facility]  Office  [phone]  CC: Primary care physician; [facility] PRIMARY CARE   Note: This dictation was prepared with Dragon dictation along with smaller phrase technology. Any transcriptional errors that result from this process are unintentional.

## 2016-01-22 LAB — CULTURE, BLOOD (ROUTINE X 2)

## 2016-01-23 LAB — CULTURE, BLOOD (ROUTINE X 2): CULTURE: NO GROWTH

## 2016-02-01 ENCOUNTER — Emergency Department: Payer: Medicare Other

## 2016-02-01 ENCOUNTER — Encounter: Payer: Self-pay | Admitting: Emergency Medicine

## 2016-02-01 DIAGNOSIS — I10 Essential (primary) hypertension: Secondary | ICD-10-CM | POA: Insufficient documentation

## 2016-02-01 DIAGNOSIS — R0789 Other chest pain: Secondary | ICD-10-CM | POA: Diagnosis present

## 2016-02-01 DIAGNOSIS — F329 Major depressive disorder, single episode, unspecified: Secondary | ICD-10-CM | POA: Diagnosis not present

## 2016-02-01 DIAGNOSIS — F1110 Opioid abuse, uncomplicated: Secondary | ICD-10-CM | POA: Diagnosis not present

## 2016-02-01 DIAGNOSIS — K2970 Gastritis, unspecified, without bleeding: Secondary | ICD-10-CM | POA: Insufficient documentation

## 2016-02-01 DIAGNOSIS — F17210 Nicotine dependence, cigarettes, uncomplicated: Secondary | ICD-10-CM | POA: Insufficient documentation

## 2016-02-01 LAB — CBC
HEMATOCRIT: 35.7 % — AB (ref 40.0–52.0)
HEMOGLOBIN: 12.4 g/dL — AB (ref 13.0–18.0)
MCH: 28.8 pg (ref 26.0–34.0)
MCHC: 34.6 g/dL (ref 32.0–36.0)
MCV: 83.3 fL (ref 80.0–100.0)
Platelets: 195 10*3/uL (ref 150–440)
RBC: 4.29 MIL/uL — AB (ref 4.40–5.90)
RDW: 15 % — AB (ref 11.5–14.5)
WBC: 8.7 10*3/uL (ref 3.8–10.6)

## 2016-02-01 LAB — BASIC METABOLIC PANEL
ANION GAP: 8 (ref 5–15)
BUN: 27 mg/dL — AB (ref 6–20)
CHLORIDE: 104 mmol/L (ref 101–111)
CO2: 28 mmol/L (ref 22–32)
Calcium: 9 mg/dL (ref 8.9–10.3)
Creatinine, Ser: 2.4 mg/dL — ABNORMAL HIGH (ref 0.61–1.24)
GFR, EST AFRICAN AMERICAN: 33 mL/min — AB (ref >60)
GFR, EST NON AFRICAN AMERICAN: 28 mL/min — AB (ref >60)
Glucose, Bld: 186 mg/dL — ABNORMAL HIGH (ref 65–99)
POTASSIUM: 2.6 mmol/L — AB (ref 3.5–5.1)
SODIUM: 140 mmol/L (ref 135–145)

## 2016-02-01 LAB — TROPONIN I: Troponin I: 0.04 ng/mL (ref <0.03)

## 2016-02-01 NOTE — ED Triage Notes (Addendum)
Pt to triage via w/c with no distress noted; pt reports midsternal CP, nonradiating this evening, with no accomp symptoms; st hx of same wih PE

## 2016-02-02 ENCOUNTER — Encounter: Payer: Self-pay | Admitting: Emergency Medicine

## 2016-02-02 ENCOUNTER — Emergency Department
Admission: EM | Admit: 2016-02-02 | Discharge: 2016-02-03 | Disposition: A | Discharge status: Home / Self Care | Payer: Medicare Other | Attending: Emergency Medicine | Admitting: Emergency Medicine

## 2016-02-02 ENCOUNTER — Emergency Department: Payer: Medicare Other

## 2016-02-02 ENCOUNTER — Emergency Department
Admission: EM | Admit: 2016-02-02 | Discharge: 2016-02-02 | Disposition: A | Discharge status: Home / Self Care | Payer: Medicare Other | Attending: Emergency Medicine | Admitting: Emergency Medicine

## 2016-02-02 DIAGNOSIS — F17210 Nicotine dependence, cigarettes, uncomplicated: Secondary | ICD-10-CM | POA: Insufficient documentation

## 2016-02-02 DIAGNOSIS — R45851 Suicidal ideations: Secondary | ICD-10-CM

## 2016-02-02 DIAGNOSIS — E876 Hypokalemia: Secondary | ICD-10-CM | POA: Diagnosis not present

## 2016-02-02 DIAGNOSIS — F329 Major depressive disorder, single episode, unspecified: Secondary | ICD-10-CM | POA: Diagnosis not present

## 2016-02-02 DIAGNOSIS — R079 Chest pain, unspecified: Secondary | ICD-10-CM

## 2016-02-02 DIAGNOSIS — Z7982 Long term (current) use of aspirin: Secondary | ICD-10-CM | POA: Insufficient documentation

## 2016-02-02 DIAGNOSIS — F1994 Other psychoactive substance use, unspecified with psychoactive substance-induced mood disorder: Secondary | ICD-10-CM

## 2016-02-02 DIAGNOSIS — I10 Essential (primary) hypertension: Secondary | ICD-10-CM | POA: Diagnosis not present

## 2016-02-02 DIAGNOSIS — R1013 Epigastric pain: Secondary | ICD-10-CM

## 2016-02-02 DIAGNOSIS — R0789 Other chest pain: Secondary | ICD-10-CM

## 2016-02-02 DIAGNOSIS — Z046 Encounter for general psychiatric examination, requested by authority: Secondary | ICD-10-CM | POA: Diagnosis present

## 2016-02-02 DIAGNOSIS — R7989 Other specified abnormal findings of blood chemistry: Secondary | ICD-10-CM

## 2016-02-02 DIAGNOSIS — F602 Antisocial personality disorder: Secondary | ICD-10-CM

## 2016-02-02 DIAGNOSIS — Z79899 Other long term (current) drug therapy: Secondary | ICD-10-CM | POA: Diagnosis not present

## 2016-02-02 DIAGNOSIS — K2970 Gastritis, unspecified, without bleeding: Secondary | ICD-10-CM

## 2016-02-02 DIAGNOSIS — F32A Depression, unspecified: Secondary | ICD-10-CM

## 2016-02-02 DIAGNOSIS — F1110 Opioid abuse, uncomplicated: Secondary | ICD-10-CM

## 2016-02-02 HISTORY — DX: Other pulmonary embolism without acute cor pulmonale: I26.99

## 2016-02-02 LAB — ETHANOL

## 2016-02-02 LAB — CBC
HCT: 38.2 % — ABNORMAL LOW (ref 40.0–52.0)
Hemoglobin: 12.9 g/dL — ABNORMAL LOW (ref 13.0–18.0)
MCH: 28.1 pg (ref 26.0–34.0)
MCHC: 33.8 g/dL (ref 32.0–36.0)
MCV: 83.2 fL (ref 80.0–100.0)
PLATELETS: 211 10*3/uL (ref 150–440)
RBC: 4.59 MIL/uL (ref 4.40–5.90)
RDW: 14.7 % — AB (ref 11.5–14.5)
WBC: 9.5 10*3/uL (ref 3.8–10.6)

## 2016-02-02 LAB — ACETAMINOPHEN LEVEL: Acetaminophen (Tylenol), Serum: 10 ug/mL (ref 10–30)

## 2016-02-02 LAB — COMPREHENSIVE METABOLIC PANEL
ALT: 17 U/L (ref 17–63)
AST: 20 U/L (ref 15–41)
Albumin: 3.7 g/dL (ref 3.5–5.0)
Alkaline Phosphatase: 52 U/L (ref 38–126)
Anion gap: 9 (ref 5–15)
BILIRUBIN TOTAL: 0.6 mg/dL (ref 0.3–1.2)
BUN: 24 mg/dL — AB (ref 6–20)
CHLORIDE: 105 mmol/L (ref 101–111)
CO2: 26 mmol/L (ref 22–32)
Calcium: 9 mg/dL (ref 8.9–10.3)
Creatinine, Ser: 2.48 mg/dL — ABNORMAL HIGH (ref 0.61–1.24)
GFR, EST AFRICAN AMERICAN: 32 mL/min — AB (ref >60)
GFR, EST NON AFRICAN AMERICAN: 27 mL/min — AB (ref >60)
Glucose, Bld: 131 mg/dL — ABNORMAL HIGH (ref 65–99)
POTASSIUM: 3 mmol/L — AB (ref 3.5–5.1)
Sodium: 140 mmol/L (ref 135–145)
TOTAL PROTEIN: 7 g/dL (ref 6.5–8.1)

## 2016-02-02 LAB — URINE DRUG SCREEN, QUALITATIVE (ARMC ONLY)
AMPHETAMINES, UR SCREEN: NOT DETECTED
BENZODIAZEPINE, UR SCRN: NOT DETECTED
Barbiturates, Ur Screen: NOT DETECTED
CANNABINOID 50 NG, UR ~~LOC~~: NOT DETECTED
Cocaine Metabolite,Ur ~~LOC~~: NOT DETECTED
MDMA (Ecstasy)Ur Screen: NOT DETECTED
Methadone Scn, Ur: NOT DETECTED
Opiate, Ur Screen: POSITIVE — AB
PHENCYCLIDINE (PCP) UR S: NOT DETECTED
TRICYCLIC, UR SCREEN: NOT DETECTED

## 2016-02-02 LAB — SALICYLATE LEVEL

## 2016-02-02 LAB — FIBRIN DERIVATIVES D-DIMER (ARMC ONLY): Fibrin derivatives D-dimer (ARMC): 1283 — ABNORMAL HIGH (ref 0–499)

## 2016-02-02 MED ORDER — OXYCODONE HCL 5 MG PO TABS
10.0000 mg | ORAL_TABLET | Freq: Four times a day (QID) | ORAL | Status: DC | PRN
  Administered 2016-02-02 – 2016-02-03 (×2): 10 mg via ORAL
  Filled 2016-02-02 (×2): qty 2

## 2016-02-02 MED ORDER — MORPHINE SULFATE (PF) 4 MG/ML IV SOLN
INTRAVENOUS | Status: AC
  Administered 2016-02-02: 4 mg via INTRAVENOUS
  Filled 2016-02-02: qty 1

## 2016-02-02 MED ORDER — ALUMINUM-MAGNESIUM-SIMETHICONE 200-200-20 MG/5ML PO SUSP: 30.0000 mL | Freq: Three times a day (TID) | ORAL | 0 refills | Status: DC

## 2016-02-02 MED ORDER — FAMOTIDINE 20 MG PO TABS
40.0000 mg | ORAL_TABLET | Freq: Once | ORAL | Status: AC
  Administered 2016-02-02: 40 mg via ORAL
  Filled 2016-02-02: qty 2

## 2016-02-02 MED ORDER — ONDANSETRON HCL 4 MG/2ML IJ SOLN
4.0000 mg | Freq: Once | INTRAMUSCULAR | Status: AC
  Administered 2016-02-02: 4 mg via INTRAVENOUS

## 2016-02-02 MED ORDER — ONDANSETRON HCL 4 MG/2ML IJ SOLN
INTRAMUSCULAR | Status: AC
  Administered 2016-02-02: 4 mg via INTRAVENOUS
  Filled 2016-02-02: qty 2

## 2016-02-02 MED ORDER — TECHNETIUM TO 99M ALBUMIN AGGREGATED
4.0000 | Freq: Once | INTRAVENOUS | Status: AC | PRN
  Administered 2016-02-02: 3.99 via INTRAVENOUS

## 2016-02-02 MED ORDER — SODIUM CHLORIDE 0.9 % IV BOLUS (SEPSIS)
1000.0000 mL | Freq: Once | INTRAVENOUS | Status: AC
  Administered 2016-02-02: 1000 mL via INTRAVENOUS

## 2016-02-02 MED ORDER — MORPHINE SULFATE (PF) 4 MG/ML IV SOLN
4.0000 mg | Freq: Once | INTRAVENOUS | Status: AC
  Administered 2016-02-02: 4 mg via INTRAVENOUS

## 2016-02-02 MED ORDER — TECHNETIUM TC 99M DIETHYLENETRIAME-PENTAACETIC ACID
30.0000 | Freq: Once | INTRAVENOUS | Status: AC | PRN
  Administered 2016-02-02: 34.33 via INTRAVENOUS

## 2016-02-02 MED ORDER — GI COCKTAIL ~~LOC~~
30.0000 mL | ORAL | Status: AC
  Administered 2016-02-02: 30 mL via ORAL
  Filled 2016-02-02: qty 30

## 2016-02-02 MED ORDER — IPRATROPIUM-ALBUTEROL 0.5-2.5 (3) MG/3ML IN SOLN
3.0000 mL | Freq: Once | RESPIRATORY_TRACT | Status: AC
  Administered 2016-02-02: 3 mL via RESPIRATORY_TRACT
  Filled 2016-02-02: qty 3

## 2016-02-02 MED ORDER — FAMOTIDINE 20 MG PO TABS: 20.0000 mg | ORAL_TABLET | Freq: Two times a day (BID) | ORAL | 0 refills | Status: DC

## 2016-02-02 NOTE — Discharge Instructions (Signed)
A nuclear medicine study was performed today to evaluate for possible blood clots in your lungs. This study did not show any evidence of blood clots at this time. Much of your symptoms appear to be due to acid reflux. Take an acid medicine as prescribed and follow-up with your doctor.

## 2016-02-02 NOTE — ED Triage Notes (Addendum)
Patient ambulatory to triage with steady gait, pt reports has been having suicidal thoughts "for a long time." Pt denies HI. Pt reports was accused of using cocaine by doctor in April and reports  Has been having SI thoughts since then. Pt reports it was not true he does not do cocaine, reports he feels like he can't trust doctors anymore. Pt alert, calm and cooperative.

## 2016-02-02 NOTE — ED Notes (Signed)
Pt. Transported to Nuclear Medicine by EDT.

## 2016-02-02 NOTE — ED Notes (Signed)
Patient taken by this RN to [place] [facility].

## 2016-02-02 NOTE — ED Provider Notes (Signed)
[facility] Emergency Department Provider Note  ____________________________________________  Time seen: Approximately 1:02 AM  I have reviewed the triage vital signs and the nursing notes.   HISTORY  Chief Complaint Chest Pain    HPI [name] is a 60 y.o. male complains of aching anterior chest pain, moderate intensity, waxing and waning in intermittent for the past 24 hours. He states it feels like when he had a pulmonary embolism 5 years ago. No exertional symptoms, not pleuritic. No shortness of breath vomiting or diaphoresis. Non-radiating.[name] recently in the hospital 2 weeks ago for similar symptoms. Serial troponins were negative. Had a follow-up nuclear medicine stress test which was essentially unremarkable.  Electronic medical record and unable to find any mention of pulmonary embolism on the patient's problem list and medical history from primary care and other outpatient visits and summaries from other hospitals.      Past Medical History:  Diagnosis Date  . Hypertension   . PE (pulmonary embolism)   . Renal disorder      Patient Active Problem List   Diagnosis Date Noted  . Left-sided weakness 01/18/2016  . HEPATITIS C 06/28/2008  . HYPERLIPIDEMIA 06/28/2008  . SUBSTANCE ABUSE, MULTIPLE 06/28/2008  . HYPERTENSION 06/27/2008     Past Surgical History:  Procedure Laterality Date  . TOTAL HIP ARTHROPLASTY       Current Outpatient Rx  . Order #: [id number] Class: Print  . Order #: [id number] Class: Historical Med  . Order #: [id number] Class: Historical Med  . Order #: [id number] Class: Print  . Order #: [id number] Class: Normal  . Order #: [id number] Class: Historical Med  . Order #: [id number] Class: Historical Med  . Order #: [id number] Class: Historical Med  . Order #: [id number] Class: Historical Med  . Order #: [id number] Class: Historical Med  . Order #: [id number] Class: Historical Med     Allergies Cyclobenzaprine;  Clonidine; Furosemide; and Tylenol [acetaminophen]   No family history on file.  Social History Social History  Substance Use Topics  . Smoking status: Current Every Day Smoker    Packs/day: 0.50    Types: Cigarettes  . Smokeless tobacco: Never Used  . Alcohol use No    Review of Systems  Constitutional:   No fever or chills.  ENT:   No sore throat. No rhinorrhea. Cardiovascular:   Positive as above chest pain. Respiratory:   No dyspnea or cough. Gastrointestinal:   Negative for abdominal pain, vomiting and diarrhea.  Genitourinary:   Negative for dysuria or difficulty urinating. Musculoskeletal:   Negative for focal pain or swelling Neurological:   Negative for headaches 10-point ROS otherwise negative.  ____________________________________________   PHYSICAL EXAM:  VITAL SIGNS: ED Triage Vitals  Enc Vitals Group     BP 02/01/16 2306 (!) 148/86     Pulse Rate 02/01/16 2306 98     Resp 02/01/16 2306 20     Temp 02/01/16 2306 98 F (36.7 C)     Temp Source 02/01/16 2306 Oral     SpO2 02/01/16 2306 98 %     Weight 02/01/16 2306 270 lb (122.5 kg)     Height 02/01/16 2306 6' (1.829 m)     Head Circumference --      Peak Flow --      Pain Score 02/01/16 2304 9     Pain Loc --      Pain Edu? --      Excl. in [place]? --     Vital signs reviewed, nursing assessments reviewed.  Constitutional:   Alert and oriented. Well appearing and in no distress. Eyes:   No scleral icterus. No conjunctival pallor. PERRL. EOMI.  No nystagmus. ENT   Head:   Normocephalic and atraumatic.   Nose:   No congestion/rhinnorhea. No septal hematoma   Mouth/Throat:   MMM, no pharyngeal erythema. No peritonsillar mass.    Neck:   No stridor. No SubQ emphysema. No meningismus. Hematological/Lymphatic/Immunilogical:   No cervical lymphadenopathy. Cardiovascular:   RRR. Symmetric bilateral radial and DP pulses.  No murmurs.  Respiratory:   Normal respiratory effort without  tachypnea nor retractions. Breath sounds are clear and equal bilaterally. No wheezes/rales/rhonchi. Gastrointestinal:   Soft With mild left upper quadrant tenderness. Non distended. There is no CVA tenderness.  No rebound, rigidity, or guarding. Genitourinary:   deferred Musculoskeletal:   Nontender with normal range of motion in all extremities. No joint effusions.  No lower extremity tenderness.  No edema.Anterior chest wall tender which reproduces the symptoms Neurologic:   Normal speech and language.  CN 2-10 normal. Motor grossly intact. No gross focal neurologic deficits are appreciated.  Skin:    Skin is warm, dry and intact. No rash noted.  No petechiae, purpura, or bullae.  ____________________________________________    LABS (pertinent positives/negatives) (all labs ordered are listed, but only abnormal results are displayed) Labs Reviewed  BASIC METABOLIC PANEL - Abnormal; Notable for the following:       Result Value   Potassium 2.6 (*)    Glucose, Bld 186 (*)    BUN 27 (*)    Creatinine, Ser 2.40 (*)    GFR calc non Af Amer 28 (*)    GFR calc Af Amer 33 (*)    All other components within normal limits  CBC - Abnormal; Notable for the following:    RBC 4.29 (*)    Hemoglobin 12.4 (*)    HCT 35.7 (*)    RDW 15.0 (*)    All other components within normal limits  TROPONIN I - Abnormal; Notable for the following:    Troponin I 0.04 (*)    All other components within normal limits  FIBRIN DERIVATIVES D-DIMER ([facility] ONLY) - Abnormal; Notable for the following:    Fibrin derivatives D-dimer [facility]) 1,283 (*)    All other components within normal limits   ____________________________________________   EKG  Interpreted by me Sinus rhythm at a rate of 84, left axis, normal intervals. Normal QRS ST segments and T waves. Occasional premature supraventricular beats  ____________________________________________    RADIOLOGY  Chest x-ray unremarkable Nuclear medicine  VQ scan low probability for PE  ____________________________________________   PROCEDURES Procedures  ____________________________________________   INITIAL IMPRESSION / ASSESSMENT AND PLAN / ED COURSE  Pertinent labs & imaging results that were available during my care of the patient were reviewed by me and considered in my medical decision making (see chart for details).  Patient well appearing no acute distress. Atypical chest pain on presentation. Exam is consistent with gastritis and acid reflux. Due to the patient's stated history and elevated d-dimer, VQ scan was performed, as CT scan is not an option with his chronic kidney disease. This was low probability for PE. Additionally I have a low suspicion of PE in this patient and based on his exam and the nature of the symptoms and his recent workup in the hospital, I have low suspicion for ACS, TAD, pneumothorax, carditis, mediastinitis, pneumonia, CHF, or sepsis.  Have the patient try a course of Maalox and  Pepcid, follow-up with primary care.       Clinical Course   ____________________________________________   FINAL CLINICAL IMPRESSION(S) / ED DIAGNOSES  Final diagnoses:  D-dimer, elevated  Chest pain  Nonspecific chest pain  Epigastric pain  Gastritis       Portions of this note were generated with dragon dictation software. Dictation errors may occur despite best attempts at proofreading.    Carrie Mew, MD 02/02/16 [phone]

## 2016-02-02 NOTE — ED Notes (Signed)
Pt reports chest pain since this afternoon - Pt states he has a history of "a blood clot in his chest five years ago" and that this pain feels the same - Pt states he is short of breath and has had difficulty breathing all day - Pt reports the chest pain as sharp and mid sternal - Pt reports dizzy earlier but that has resolved - Pt denies nausea and vomiting

## 2016-02-02 NOTE — ED Notes (Signed)
BEHAVIORAL HEALTH ROUNDING Patient sleeping: No Patient alert and oriented: Yes Behavior appropriate: Yes Describe behavior: No inappropriate or unacceptable behaviors noted at this time.  Nutrition and fluids offered: Yes Toileting and hygiene offered: Yes, and blanket given Sitter present: Behavioral tech rounding every 15 minutes on patient to ensure safety.  Law enforcement present: Yes Event organiser agency: [employer] ([place])

## 2016-02-02 NOTE — ED Notes (Signed)

## 2016-02-02 NOTE — ED Provider Notes (Signed)
[facility] Emergency Department Provider Note  ____________________________________________  Time seen: Approximately 10:14 PM    I have reviewed the triage vital signs and the nursing notes.   HISTORY  Chief Complaint Psychiatric Evaluation    HPI [name] is a 60 y.o. male who has had several recent visits to the ED for chest pain who presents tonight by private vehicle with a chief complaint of depression and suicidal thoughts.  He reports that his depression has been gradual in onset over the last month or so since an outpatient physician "accused me of using cocaine".  He reports that he has not seen anyone for this issue and has not shared his depression or concerns with anyone until today when he talked to his brother about it, who then insisted that he come to the ED.  He reports that recently he has been having suicidal thoughts, having had repeated thoughts about killing himself "by stabbing".  Tonight he denies fever/chills, CP, SOB, abd pain, N/V/D, dysuria.  Reports chronic back pain for which he is prescribed narcotics.  Denies seeing and hearing things that he knows are not there.  Denies HI.      Past Medical History:  Diagnosis Date  . Hypertension   . PE (pulmonary embolism)   . Renal disorder     Patient Active Problem List   Diagnosis Date Noted  . Left-sided weakness 01/18/2016  . HEPATITIS C 06/28/2008  . HYPERLIPIDEMIA 06/28/2008  . SUBSTANCE ABUSE, MULTIPLE 06/28/2008  . HYPERTENSION 06/27/2008    Past Surgical History:  Procedure Laterality Date  . TOTAL HIP ARTHROPLASTY      Prior to Admission medications   Medication Sig Start Date End Date Taking? Authorizing Provider  aluminum-magnesium hydroxide-simethicone (MAALOX) [id number] MG/5ML SUSP Take 30 mLs by mouth 4 (four) times daily -  before meals and at bedtime. 02/02/16  Yes Carrie Mew, MD  amLODipine (NORVASC) 10 MG tablet Take 10 mg by mouth  daily. 11/27/15  Yes Historical Provider, MD  aspirin EC 81 MG tablet Take 81 mg by mouth daily.   Yes Historical Provider, MD  atorvastatin (LIPITOR) 40 MG tablet Take 40 mg by mouth daily. 11/27/15  Yes Historical Provider, MD  famotidine (PEPCID) 20 MG tablet Take 1 tablet (20 mg total) by mouth 2 (two) times daily. 02/02/16  Yes Carrie Mew, MD  hydrALAZINE (APRESOLINE) 100 MG tablet Take 1 tablet (100 mg total) by mouth 3 (three) times daily. 01/18/16  Yes Vipul Manuella Ghazi, MD  hydrochlorothiazide (HYDRODIURIL) 25 MG tablet Take 25 mg by mouth daily. 11/27/15  Yes Historical Provider, MD  polyethylene glycol (MIRALAX / GLYCOLAX) packet Take 17 g by mouth as needed. 11/27/15  Yes Historical Provider, MD  tamsulosin (FLOMAX) 0.4 MG CAPS capsule Take 1 capsule by mouth daily. 11/03/15  Yes Historical Provider, MD  traZODone (DESYREL) 100 MG tablet Take 100 mg by mouth at bedtime as needed. 10/22/15  Yes Historical Provider, MD     Allergies Cyclobenzaprine; Clonidine; Furosemide; and Tylenol [acetaminophen]  No family history on file.  Social History Social History  Substance Use Topics  . Smoking status: Current Every Day Smoker    Packs/day: 0.50    Types: Cigarettes  . Smokeless tobacco: Never Used  . Alcohol use No    Review of Systems Constitutional: No fever/chills Eyes: No visual changes. ENT: No sore throat. Cardiovascular: Denies chest pain. Respiratory: Denies shortness of breath. Gastrointestinal: No abdominal pain.  No nausea, no vomiting.  No diarrhea.  No constipation. Genitourinary: Negative for dysuria. Musculoskeletal: Negative for back pain. Skin: Negative for rash. Neurological: Negative for headaches, focal weakness or numbness.  10-point ROS otherwise negative.  ____________________________________________   PHYSICAL EXAM:  VITAL SIGNS: ED Triage Vitals  Enc Vitals Group     BP 02/02/16 1940 131/82     Pulse Rate 02/02/16 1940 88     Resp 02/02/16 1940  18     Temp 02/02/16 1940 98.4 F (36.9 C)     Temp Source 02/02/16 1940 Oral     SpO2 02/02/16 1940 94 %     Weight 02/02/16 1940 270 lb (122.5 kg)     Height 02/02/16 1940 6' (1.829 m)     Head Circumference --      Peak Flow --      Pain Score 02/02/16 1942 0     Pain Loc --      Pain Edu? --      Excl. in [place]? --     Constitutional: Alert and oriented. Well appearing and in no acute distress. Eyes: Conjunctivae are normal. PERRL. EOMI. Head: Atraumatic. Nose: No congestion/rhinnorhea. Mouth/Throat: Mucous membranes are moist.  Oropharynx non-erythematous. Neck: No stridor.  No meningeal signs.   Cardiovascular: Normal rate, regular rhythm. Good peripheral circulation. Grossly normal heart sounds.   Respiratory: Normal respiratory effort.  No retractions. Lungs CTAB. Gastrointestinal: Soft and nontender. No distention.  Musculoskeletal: No lower extremity tenderness nor edema. No gross deformities of extremities. Neurologic:  Normal speech and language. No gross focal neurologic deficits are appreciated.  Skin:  Skin is warm, dry and intact. No rash noted. Psychiatric: Mood and affect are normal. Speech and behavior are normal. Endorses SI, denies HI.  +Depression.  Denies hallucinations.  ____________________________________________   LABS (all labs ordered are listed, but only abnormal results are displayed)  Labs Reviewed  COMPREHENSIVE METABOLIC PANEL - Abnormal; Notable for the following:       Result Value   Potassium 3.0 (*)    Glucose, Bld 131 (*)    BUN 24 (*)    Creatinine, Ser 2.48 (*)    GFR calc non Af Amer 27 (*)    GFR calc Af Amer 32 (*)    All other components within normal limits  CBC - Abnormal; Notable for the following:    Hemoglobin 12.9 (*)    HCT 38.2 (*)    RDW 14.7 (*)    All other components within normal limits  URINE DRUG SCREEN, QUALITATIVE ([facility] ONLY) - Abnormal; Notable for the following:    Opiate, Ur Screen POSITIVE (*)    All  other components within normal limits  ETHANOL  SALICYLATE LEVEL  ACETAMINOPHEN LEVEL   ____________________________________________  EKG  None ____________________________________________  RADIOLOGY  None tonight  ____________________________________________   PROCEDURES  Procedure(s) performed:   Procedures   ____________________________________________   INITIAL IMPRESSION / ASSESSMENT AND PLAN / ED COURSE  Pertinent labs & imaging results that were available during my care of the patient were reviewed by me and considered in my medical decision making (see chart for details).  Gradual onset worsening depression. No outpatient psych provider.  Low suspicion that the patient presents an acute danger to himself, and he wants to be here; will not place patient under IVC at this time because if he decides to leave I believe he would be appropriate for outpatient follow up.  I have ordered TTS and psych consults.  Ordered scheduled pain meds.  No indication of acute medical disorder.   Clinical Course    ____________________________________________  FINAL CLINICAL IMPRESSION(S) / ED DIAGNOSES  Final diagnoses:  Depression  Suicidal ideation     MEDICATIONS GIVEN DURING THIS VISIT:  Medications  oxyCODONE (Oxy IR/ROXICODONE) immediate release tablet 10 mg (10 mg Oral Given 02/02/16 2356)     NEW OUTPATIENT MEDICATIONS STARTED DURING THIS VISIT:  New Prescriptions   No medications on file      Note:  This document was prepared using Dragon voice recognition software and may include unintentional dictation errors.    Hinda Kehr, MD 02/03/16 [phone]

## 2016-02-02 NOTE — ED Notes (Signed)
Report given to Maudie Mercury, [employer]

## 2016-02-02 NOTE — ED Notes (Signed)
Returned from nuclear medicine.

## 2016-02-03 DIAGNOSIS — F1110 Opioid abuse, uncomplicated: Secondary | ICD-10-CM | POA: Diagnosis not present

## 2016-02-03 DIAGNOSIS — F1994 Other psychoactive substance use, unspecified with psychoactive substance-induced mood disorder: Secondary | ICD-10-CM

## 2016-02-03 DIAGNOSIS — F602 Antisocial personality disorder: Secondary | ICD-10-CM

## 2016-02-03 MED ORDER — AMLODIPINE BESYLATE 5 MG PO TABS
10.0000 mg | ORAL_TABLET | Freq: Every day | ORAL | Status: DC
  Administered 2016-02-03: 10 mg via ORAL
  Filled 2016-02-03: qty 2

## 2016-02-03 MED ORDER — HYDRALAZINE HCL 50 MG PO TABS
100.0000 mg | ORAL_TABLET | Freq: Three times a day (TID) | ORAL | Status: DC
  Administered 2016-02-03: 100 mg via ORAL
  Filled 2016-02-03 (×4): qty 2

## 2016-02-03 MED ORDER — POTASSIUM CHLORIDE CRYS ER 20 MEQ PO TBCR
40.0000 meq | EXTENDED_RELEASE_TABLET | Freq: Once | ORAL | Status: AC
  Administered 2016-02-03: 40 meq via ORAL
  Filled 2016-02-03: qty 2

## 2016-02-03 MED ORDER — HYDROCHLOROTHIAZIDE 25 MG PO TABS
25.0000 mg | ORAL_TABLET | Freq: Every day | ORAL | Status: DC
  Administered 2016-02-03: 25 mg via ORAL
  Filled 2016-02-03: qty 1

## 2016-02-03 MED ORDER — POTASSIUM CHLORIDE ER 10 MEQ PO TBCR: 10.0000 meq | EXTENDED_RELEASE_TABLET | Freq: Every day | ORAL | 1 refills | Status: DC

## 2016-02-03 MED ORDER — OXYCODONE HCL 5 MG PO TABS
ORAL_TABLET | ORAL | Status: AC
  Administered 2016-02-03: 10 mg
  Filled 2016-02-03: qty 2

## 2016-02-03 NOTE — ED Provider Notes (Signed)
-----------------------------------------   1:44 PM on 02/03/2016 -----------------------------------------   Blood pressure (!) 160/102, pulse 60, temperature 98.1 F (36.7 C), temperature source Oral, resp. rate 20, height 6' (1.829 m), weight 270 lb (122.5 kg), SpO2 99 %.  Patient evaluated by psychiatry and cleared for discharge. Labs reviewed. Patient found to be hypokalemic with K 3.0. Supplemented orally and will be dc on PO supplementation. Remaining of labs within patient's baseline.    Rudene Re, MD 02/03/16 1346

## 2016-02-03 NOTE — Progress Notes (Signed)
Per request of MD ([place]), writer provided the pt. with information and instructions on how to access [facility]patient [facility] ([facility])   02/03/2016 Con Memos, MS, [place], [facility] Therapeutic Triage Specialist

## 2016-02-03 NOTE — ED Notes (Signed)

## 2016-02-03 NOTE — Consult Note (Signed)
[facility] Face-to-Face Psychiatry Consult   Reason for Consult:  61 year old man with a history of opiate abuse and manipulation of the system as well as irritability and anger. Referring Physician:  Joni Fears Patient Identification: [name] MRN:  [id number] Principal Diagnosis: Opiate abuse, continuous Diagnosis:   Patient Active Problem List   Diagnosis Date Noted  . Opiate abuse, continuous [F11.10] 02/03/2016  . Substance induced mood disorder ([place]) [F19.94] 02/03/2016  . Antisocial personality disorder [F60.2] 02/03/2016  . Left-sided weakness [M62.89] 01/18/2016  . HEPATITIS C [B17.10] 06/28/2008  . HYPERLIPIDEMIA [E78.5] 06/28/2008  . SUBSTANCE ABUSE, MULTIPLE [F19.10] 06/28/2008  . Hypertension [I10] 06/27/2008    Total Time spent with patient: 1 hour  Subjective:   [name] is a 60 y.o. male patient admitted with "I was thinking of killing myself".  HPI:  Patient interviewed. Chart reviewed. Labs and vitals reviewed. Old chart reviewed. Also went over several notes from the [facility] system. This 60 year old man came to the emergency room voluntarily saying that he was having thoughts about killing himself. When I asked him about it today he says the reason he was thinking of killing himself is because he had a disagreement with his doctor in April. He says that in April of Dr. refused to treat him with narcotics and ever since then he's been feeling bad about it. He doesn't have any more lucid description of his mood within that. Denies that he had any plan or intention of killing himself and denies having any acute plan or intention of killing himself. Sleep is chronically poor. He complains a lot about physical symptoms including his chronic back pain. Denies psychotic symptoms. He is currently denying any hallucinations and denies any homicidal ideation. Patient is not currently receiving any kind of psychiatric treatment at all. He denies that he is abusing any  drugs.  Social history: He states that he lives with his parents are in town. He gets disability. Doesn't have very much activity.  Medical history: Patient has a history of high blood pressure poorly controlled and a history of chronic pain with opiate abuse as well as hepatitis C. It looks like he had a hip replacement back in May of this year.  Substance abuse history: Going back over our records patient has a well-documented history of presenting with substance abuse problems and of abusing opiates. Reviewing his orders in the controlled substance database as well as notes from the [facility] system it is clear that he has been engaging an extensive "doctor shopping" getting multiple different prescriptions from multiple different providers for narcotics. Patient denies that he is using anything except prescription medicine although I have my suspicions about whether that is accurate. His drug screen is positive for opiates and OxyContin does not usually show up on the opiate screen here. He denies that he is currently using any other substances.  Past Psychiatric History: Multiple prior presentations to the hospital with opiate abuse and reports of depression. In the past he would frequently complain of homicidal ideation which was always vaguely directed when he did not get his way. We don't have any clear known history of serious suicide attempts. Not clear that he is ever been violent to others. We'll often complained of mood symptoms but never follows up with any outpatient psychiatric treatment.  Risk to Self: Suicidal Ideation: Yes-Currently Present Suicidal Intent: No Is patient at risk for suicide?: Yes Suicidal Plan?: Yes-Currently Present Specify Current Suicidal Plan: To stab self  Access to Means:  Yes What has been your use of drugs/alcohol within the last 12 months?: None Reported  How many times?: 0 Other Self Harm Risks: 0 Triggers for Past Attempts: Other (Comment) (N/A) Intentional  Self Injurious Behavior: None Risk to Others: Homicidal Ideation: No Thoughts of Harm to Others: No Current Homicidal Intent: No Current Homicidal Plan: No Access to Homicidal Means: No Identified Victim: N/A History of harm to others?: No Assessment of Violence: None Noted Violent Behavior Description: N/A Does patient have access to weapons?: No Criminal Charges Pending?: No Does patient have a court date: No Prior Inpatient Therapy: Prior Inpatient Therapy: Yes Prior Therapy Dates: 2016 Prior Therapy Facilty/Provider(s): [facility], [facility]/date unknown  Reason for Treatment: depression  Prior Outpatient Therapy: Prior Outpatient Therapy: No Prior Therapy Dates: None Reported  Prior Therapy Facilty/Provider(s): None Reported  Reason for Treatment: None Reported  Does patient have an ACCT team?: No Does patient have Intensive In-House Services?  : No Does patient have [facility] services? : No Does patient have P4CC services?: No  Past Medical History:  Past Medical History:  Diagnosis Date  . Hypertension   . PE (pulmonary embolism)   . Renal disorder     Past Surgical History:  Procedure Laterality Date  . TOTAL HIP ARTHROPLASTY     Family History: No family history on file. Family Psychiatric  History: Patient denies being aware of any family mental health problems Social History:  History  Alcohol Use No     History  Drug Use No    Social History   Social History  . Marital status: Divorced    Spouse name: N/A  . Number of children: N/A  . Years of education: N/A   Social History Main Topics  . Smoking status: Current Every Day Smoker    Packs/day: 0.50    Types: Cigarettes  . Smokeless tobacco: Never Used  . Alcohol use No  . Drug use: No  . Sexual activity: Not Asked   Other Topics Concern  . None   Social History Narrative  . None   Additional Social History:    Allergies:   Allergies  Allergen Reactions  . Cyclobenzaprine Nausea Only  .  Clonidine Other (See Comments)    Asymptomatic bradycardia to 38  . Furosemide Other (See Comments)    Caused renal failure  . Tylenol [Acetaminophen] Other (See Comments)    Reaction:  Pt states it bothers his liver    Labs:  Results for orders placed or performed during the hospital encounter of 02/02/16 (from the past 48 hour(s))  Comprehensive metabolic panel     Status: Abnormal   Collection Time: 02/02/16  7:46 PM  Result Value Ref Range   Sodium 140 135 - 145 mmol/L   Potassium 3.0 (L) 3.5 - 5.1 mmol/L   Chloride 105 101 - 111 mmol/L   CO2 26 22 - 32 mmol/L   Glucose, Bld 131 (H) 65 - 99 mg/dL   BUN 24 (H) 6 - 20 mg/dL   Creatinine, Ser 2.48 (H) 0.61 - 1.24 mg/dL   Calcium 9.0 8.9 - 10.3 mg/dL   Total Protein 7.0 6.5 - 8.1 g/dL   Albumin 3.7 3.5 - 5.0 g/dL   AST 20 15 - 41 U/L   ALT 17 17 - 63 U/L   Alkaline Phosphatase 52 38 - 126 U/L   Total Bilirubin 0.6 0.3 - 1.2 mg/dL   GFR calc non Af Amer 27 (L) >60 mL/min   GFR calc Af Wyvonnia Lora  32 (L) >60 mL/min    Comment: (NOTE) The eGFR has been calculated using the CKD EPI equation. This calculation has not been validated in all clinical situations. eGFR's persistently <60 mL/min signify possible Chronic Kidney Disease.    Anion gap 9 5 - 15  Ethanol     Status: None   Collection Time: 02/02/16  7:46 PM  Result Value Ref Range   Alcohol, Ethyl (B) <5 <5 mg/dL    Comment:        LOWEST DETECTABLE LIMIT FOR SERUM ALCOHOL IS 5 mg/dL FOR MEDICAL PURPOSES ONLY   Salicylate level     Status: None   Collection Time: 02/02/16  7:46 PM  Result Value Ref Range   Salicylate Lvl <1.0 2.8 - 30.0 mg/dL  Acetaminophen level     Status: None   Collection Time: 02/02/16  7:46 PM  Result Value Ref Range   Acetaminophen (Tylenol), Serum 10 10 - 30 ug/mL    Comment:        THERAPEUTIC CONCENTRATIONS VARY SIGNIFICANTLY. A RANGE OF 10-30 ug/mL MAY BE AN EFFECTIVE CONCENTRATION FOR MANY PATIENTS. HOWEVER, SOME ARE BEST TREATED AT  CONCENTRATIONS OUTSIDE THIS RANGE. ACETAMINOPHEN CONCENTRATIONS >150 ug/mL AT 4 HOURS AFTER INGESTION AND >50 ug/mL AT 12 HOURS AFTER INGESTION ARE OFTEN ASSOCIATED WITH TOXIC REACTIONS.   cbc     Status: Abnormal   Collection Time: 02/02/16  7:46 PM  Result Value Ref Range   WBC 9.5 3.8 - 10.6 K/uL   RBC 4.59 4.40 - 5.90 MIL/uL   Hemoglobin 12.9 (L) 13.0 - 18.0 g/dL   HCT 38.2 (L) 40.0 - 52.0 %   MCV 83.2 80.0 - 100.0 fL   MCH 28.1 26.0 - 34.0 pg   MCHC 33.8 32.0 - 36.0 g/dL   RDW 14.7 (H) 11.5 - 14.5 %   Platelets 211 150 - 440 K/uL  Urine Drug Screen, Qualitative     Status: Abnormal   Collection Time: 02/02/16  7:47 PM  Result Value Ref Range   Tricyclic, Ur Screen NONE DETECTED NONE DETECTED   Amphetamines, Ur Screen NONE DETECTED NONE DETECTED   MDMA (Ecstasy)Ur Screen NONE DETECTED NONE DETECTED   Cocaine Metabolite,Ur [place] NONE DETECTED NONE DETECTED   Opiate, Ur Screen POSITIVE (A) NONE DETECTED   Phencyclidine (PCP) Ur S NONE DETECTED NONE DETECTED   Cannabinoid 50 Ng, Ur [place] NONE DETECTED NONE DETECTED   Barbiturates, Ur Screen NONE DETECTED NONE DETECTED   Benzodiazepine, Ur Scrn NONE DETECTED NONE DETECTED   Methadone Scn, Ur NONE DETECTED NONE DETECTED    Comment: (NOTE) 175  Tricyclics, urine               Cutoff 1000 ng/mL 200  Amphetamines, urine             Cutoff 1000 ng/mL 300  MDMA (Ecstasy), urine           Cutoff 500 ng/mL 400  Cocaine Metabolite, urine       Cutoff 300 ng/mL 500  Opiate, urine                   Cutoff 300 ng/mL 600  Phencyclidine (PCP), urine      Cutoff 25 ng/mL 700  Cannabinoid, urine              Cutoff 50 ng/mL 800  Barbiturates, urine             Cutoff 200 ng/mL 900  Benzodiazepine, urine  Cutoff 200 ng/mL 1000 Methadone, urine                Cutoff 300 ng/mL 1100 1200 The urine drug screen provides only a preliminary, unconfirmed 1300 analytical test result and should not be used for non-medical 1400 purposes.  Clinical consideration and professional judgment should 1500 be applied to any positive drug screen result due to possible 1600 interfering substances. A more specific alternate chemical method 1700 must be used in order to obtain a confirmed analytical result.  1800 Gas chromato graphy / mass spectrometry (GC/MS) is the preferred 1900 confirmatory method.     Current Facility-Administered Medications  Medication Dose Route Frequency Provider Last Rate Last Dose  . amLODipine (NORVASC) tablet 10 mg  10 mg Oral Daily Carrie Mew, MD   10 mg at 02/03/16 0948  . hydrALAZINE (APRESOLINE) tablet 100 mg  100 mg Oral TID Carrie Mew, MD   100 mg at 02/03/16 0948  . hydrochlorothiazide (HYDRODIURIL) tablet 25 mg  25 mg Oral Daily Carrie Mew, MD   25 mg at 02/03/16 0949  . potassium chloride SA (K-DUR,KLOR-CON) CR tablet 40 mEq  40 mEq Oral Once Rudene Re, MD       Current Outpatient Prescriptions  Medication Sig Dispense Refill  . aluminum-magnesium hydroxide-simethicone (MAALOX) [phone] MG/5ML SUSP Take 30 mLs by mouth 4 (four) times daily -  before meals and at bedtime. 355 mL 0  . amLODipine (NORVASC) 10 MG tablet Take 10 mg by mouth daily.  11  . aspirin EC 81 MG tablet Take 81 mg by mouth daily.    Marland Kitchen atorvastatin (LIPITOR) 40 MG tablet Take 40 mg by mouth daily.  0  . famotidine (PEPCID) 20 MG tablet Take 1 tablet (20 mg total) by mouth 2 (two) times daily. 60 tablet 0  . hydrALAZINE (APRESOLINE) 100 MG tablet Take 1 tablet (100 mg total) by mouth 3 (three) times daily. 90 tablet 0  . hydrochlorothiazide (HYDRODIURIL) 25 MG tablet Take 25 mg by mouth daily.  11  . polyethylene glycol (MIRALAX / GLYCOLAX) packet Take 17 g by mouth as needed.  0  . tamsulosin (FLOMAX) 0.4 MG CAPS capsule Take 1 capsule by mouth daily.  5  . traZODone (DESYREL) 100 MG tablet Take 100 mg by mouth at bedtime as needed.    . potassium chloride (K-DUR) 10 MEQ tablet Take 1 tablet (10 mEq  total) by mouth daily. 30 tablet 1    Musculoskeletal: Strength & Muscle Tone: within normal limits Gait & Station: normal Patient leans: N/A  Psychiatric Specialty Exam: Physical Exam  Nursing note and vitals reviewed. Constitutional: He appears well-developed and well-nourished.  HENT:  Head: Normocephalic and atraumatic.  Eyes: Conjunctivae are normal. Pupils are equal, round, and reactive to light.  Neck: Normal range of motion.  Cardiovascular: Normal heart sounds.   Respiratory: Effort normal.  GI: Soft.  Musculoskeletal: Normal range of motion.  Neurological: He is alert.  Skin: Skin is warm and dry.  Psychiatric: His speech is normal and behavior is normal. Judgment normal. His affect is blunt. Cognition and memory are normal. He exhibits a depressed mood. He expresses no suicidal plans.    Review of Systems  Constitutional: Negative.   HENT: Negative.   Eyes: Negative.   Respiratory: Negative.   Cardiovascular: Negative.   Gastrointestinal: Negative.   Musculoskeletal: Negative.   Skin: Negative.   Neurological: Negative.   Psychiatric/Behavioral: Positive for depression and suicidal ideas. Negative for hallucinations, memory loss  and substance abuse. The patient is nervous/anxious. The patient does not have insomnia.     Blood pressure (!) 160/102, pulse 60, temperature 98.1 F (36.7 C), temperature source Oral, resp. rate 20, height 6' (1.829 m), weight 122.5 kg (270 lb), SpO2 99 %.Body mass index is 36.62 kg/m.  General Appearance: Casual  Eye Contact:  Minimal  Speech:  Clear and Coherent  Volume:  Normal  Mood:  Irritable  Affect:  Congruent  Thought Process:  Goal Directed  Orientation:  Full (Time, Place, and Person)  Thought Content:  Logical  Suicidal Thoughts:  No  Homicidal Thoughts:  No  Memory:  Immediate;   Good Recent;   Fair Remote;   Fair  Judgement:  Impaired  Insight:  Shallow  Psychomotor Activity:  Normal  Concentration:   Concentration: Fair  Recall:  [employer] of Knowledge:  Fair  Language:  Fair  Akathisia:  No  Handed:  Right  AIMS (if indicated):     Assets:  Communication Skills Housing  ADL's:  Intact  Cognition:  WNL  Sleep:        Treatment Plan Summary: Plan 60 year old man presents claiming he had suicidal ideation. His description of it to me makes very little sense. He has had multiple medical encounters that I can see since April and on most of them is not talking about suicidal ideation. What I do see clear evidence of his a pattern of perceiving narcotic medication from multiple providers giving multiple rationales for it. Not following up with any kind of outpatient psychiatric treatment. Clearly doesn't have any ongoing provider. Patient would not benefit from and does not require inpatient psychiatric hospitalization. Does not meet commitment criteria. Unlikely to be of any risk to himself other than his ongoing substance abuse. Denies suicidal ideation currently. He can be released from the emergency room and has been strongly encouraged to follow-up with local mental health agencies for appropriate care if needed.  Disposition: Patient does not meet criteria for psychiatric inpatient admission. Supportive therapy provided about ongoing stressors.  Alethia Berthold, MD 02/03/2016 1:59 PM

## 2016-02-03 NOTE — ED Notes (Signed)
E signature unavailable at this itme , pt verbalizes understanding of dc instructions and meds ,he has a friend picking him up

## 2016-02-03 NOTE — Discharge Instructions (Signed)
You have been seen in the Emergency Department (ED)  today for a psychiatric complaint.  You have been evaluated by psychiatry and we believe you are safe to be discharged from the hospital.   ° °Please return to the Emergency Department (ED)  immediately if you have ANY thoughts of hurting yourself or anyone else, so that we may help you. ° °Please avoid alcohol and drug use. ° °Follow up with your doctor and/or therapist as soon as possible regarding today's ED  visit.  ° °You may call crisis hotline for [place] at [phone]. ° °

## 2016-02-03 NOTE — BH Assessment (Signed)
Discussed this patient with Priscille Loveless NP, as had received notification from Elmyra Ricks, counselor that patient may be appropriate for OBS. Reviewed labs, and VS noted patient has history of HTN, and Chronic Kidney Disease with elevated BP 150/100. Phoned and spoke to Rod Holler , who is primary RN for this patient. She informed Probation officer that he had just received his am bp medications. Will recheck and phone with result. Writer rechecked in EPIC to note that patient remains hypertensive, with BP now at 160/102. Per Farris Has NP , patient may receive OBS bed once BP is stable, less than [id number] systolic, less than 90 diastolic, per [place] OBS unit is lower level of care, and unable to manage unstable BP is this unit.  Will continue to monitor and await update from York Pellant.

## 2016-02-03 NOTE — ED Notes (Signed)
Pt. To BHU from ED ambulatory without difficulty, to room  BHU 2. Report from [employer]. Pt. Is alert and oriented, warm and dry in no distress. Pt. Denies SI, HI, and AVH. Pt. Calm and cooperative. Pt. Made aware of security cameras and Q15 minute rounds. Pt. Encouraged to let Nursing staff know of any concerns or needs.

## 2016-02-03 NOTE — ED Notes (Signed)
BP 151/100 MD made aware

## 2016-02-03 NOTE — BH Assessment (Addendum)
Assessment Note  [name] is an 60 y.o. male. Who has presented voluntarily to the ER with complaints of depression and SI. He reports that his depression has been gradual in onset over the last three months or so since an outpatient physician "accused me of using cocaine".  He reports that this conflict triggered him. Pt states that he has experienced suicidal thoughts in the past although, not to this degree. Pt states that he has a plan but no real intent to follow through. Pt reports that he has had previous inpatient admissions, he appears to be unaware of his diagnosis. Pt denies any previous suicide attempts and states that he is not currently receiving any outpatient MH treatment. Pt denies the use of any mood altering substances. Pt UDS (+) for opiates, pt reports that he is taking oxycodone as well as percocet for back pain, secondary to issus es with 2 dislocated disk.A behavioral health assessment has been completed including evaluation of the patient, collecting collateral history:, reviewing available medical/clinic records, evaluating his unique risk and protective factors, and discussing treatment recommendations.    Diagnosis: Major Depressive Disorder    Past Medical History:  Past Medical History:  Diagnosis Date  . Hypertension   . PE (pulmonary embolism)   . Renal disorder     Past Surgical History:  Procedure Laterality Date  . TOTAL HIP ARTHROPLASTY      Family History: No family history on file.  Social History:  reports that he has been smoking Cigarettes.  He has been smoking about 0.50 packs per day. He has never used smokeless tobacco. He reports that he does not drink alcohol or use drugs.  Additional Social History:  Alcohol / Drug Use Pain Medications: See PTA  Prescriptions: See PTA  Over the Counter: See PTA  History of alcohol / drug use?: No history of alcohol / drug abuse Longest period of sobriety (when/how long): pt denies use  Negative  Consequences of Use:  (N/A) Withdrawal Symptoms:  (N/A)  CIWA: CIWA-Ar BP: (!) 150/100 Pulse Rate: 61 COWS:    Allergies:  Allergies  Allergen Reactions  . Cyclobenzaprine Nausea Only  . Clonidine Other (See Comments)    Asymptomatic bradycardia to 38  . Furosemide Other (See Comments)    Caused renal failure  . Tylenol [Acetaminophen] Other (See Comments)    Reaction:  Pt states it bothers his liver    Home Medications:  (Not in a hospital admission)  OB/GYN Status:  No LMP for male patient.  General Assessment Data Location of Assessment: [facility] ED TTS Assessment: In system Is this a Tele or Face-to-Face Assessment?: Face-to-Face Is this an Initial Assessment or a Re-assessment for this encounter?: Initial Assessment Marital status: Single Living Arrangements: Other relatives Can pt return to current living arrangement?: Yes Admission Status: Voluntary Is patient capable of signing voluntary admission?: Yes Referral Source: Self/Family/Friend Insurance type: Medicare   Medical Screening Exam ([place]) Medical Exam completed: Yes  Crisis Care Plan Living Arrangements: Other relatives Legal Guardian: Other: (None ) Name of Psychiatrist: None Reported  Name of Therapist: None Reported   Education Status Is patient currently in school?: No Current Grade: None Reported  Highest grade of school patient has completed: college  Name of school: N/A Contact person: N/A  Risk to self with the past 6 months Suicidal Ideation: Yes-Currently Present Has patient been a risk to self within the past 6 months prior to admission? : Yes Suicidal Intent: No Has  patient had any suicidal intent within the past 6 months prior to admission? : No Is patient at risk for suicide?: Yes Suicidal Plan?: Yes-Currently Present Has patient had any suicidal plan within the past 6 months prior to admission? : Yes Specify Current Suicidal Plan: To stab self  Access to Means:  Yes What has been your use of drugs/alcohol within the last 12 months?: None Reported  Previous Attempts/Gestures: No How many times?: 0 Other Self Harm Risks: 0 Triggers for Past Attempts: Other (Comment) (N/A) Intentional Self Injurious Behavior: None Family Suicide History: Unknown Recent stressful life event(s): Conflict (Comment) Persecutory voices/beliefs?: No Depression: Yes Depression Symptoms: Feeling angry/irritable, Loss of interest in usual pleasures Substance abuse history and/or treatment for substance abuse?: No Suicide prevention information given to non-admitted patients: Yes  Risk to Others within the past 6 months Homicidal Ideation: No Does patient have any lifetime risk of violence toward others beyond the six months prior to admission? : No Thoughts of Harm to Others: No Current Homicidal Intent: No Current Homicidal Plan: No Access to Homicidal Means: No Identified Victim: N/A History of harm to others?: No Assessment of Violence: None Noted Violent Behavior Description: N/A Does patient have access to weapons?: No Criminal Charges Pending?: No Does patient have a court date: No Is patient on probation?: No  Psychosis Hallucinations: None noted Delusions: None noted  Mental Status Report Appearance/Hygiene: In scrubs Eye Contact: Fair Motor Activity: Unable to assess Speech: Logical/coherent Level of Consciousness: Alert Mood: Depressed Affect: Blunted Anxiety Level: None Thought Processes: Coherent Judgement: Partial Orientation: Time, Place, Person, Situation Obsessive Compulsive Thoughts/Behaviors: None  Cognitive Functioning Concentration: Fair Memory: Remote Intact, Recent Intact IQ: Average Insight: Fair Impulse Control: Fair Appetite: Fair Weight Loss: 0 Weight Gain: 0 Sleep: No Change Total Hours of Sleep: 5 Vegetative Symptoms: None  ADLScreening [facility] Assessment Services) Patient's cognitive ability adequate to safely  complete daily activities?: Yes Patient able to express need for assistance with ADLs?: Yes Independently performs ADLs?: Yes (appropriate for developmental age)  Prior Inpatient Therapy Prior Inpatient Therapy: Yes Prior Therapy Dates: 2016 Prior Therapy Facilty/Provider(s): [facility], [facility]/date unknown  Reason for Treatment: depression   Prior Outpatient Therapy Prior Outpatient Therapy: No Prior Therapy Dates: None Reported  Prior Therapy Facilty/Provider(s): None Reported  Reason for Treatment: None Reported  Does patient have an ACCT team?: No Does patient have Intensive In-House Services?  : No Does patient have [facility] services? : No Does patient have P4CC services?: No  ADL Screening (condition at time of admission) Patient's cognitive ability adequate to safely complete daily activities?: Yes Patient able to express need for assistance with ADLs?: Yes Independently performs ADLs?: Yes (appropriate for developmental age)       Abuse/Neglect Assessment (Assessment to be complete while patient is alone) Physical Abuse: Denies Verbal Abuse: Denies Sexual Abuse: Denies Exploitation of patient/patient's resources: Denies Self-Neglect: Denies Values / Beliefs Cultural Requests During Hospitalization: None Spiritual Requests During Hospitalization: None Consults Spiritual Care Consult Needed: No Social Work Consult Needed: No Regulatory affairs officer (For Healthcare) Does patient have an advance directive?: No Would patient like information on creating an advanced directive?: No - patient declined information    Additional Information 1:1 In Past 12 Months?: No CIRT Risk: No Elopement Risk: No Does patient have medical clearance?: Yes     Disposition:  Disposition Initial Assessment Completed for this Encounter: Yes Disposition of Patient: Referred to Patient referred to: Other (Comment) (Consult with Psych MD )  On Site Evaluation by:  Reviewed with Physician:     Laretta Alstrom 02/03/2016 10:43 AM

## 2016-02-03 NOTE — ED Notes (Signed)
ED BHU [place] Is the patient under IVC or is there intent for IVC: No. Is the patient medically cleared: Yes.   Is there vacancy in the ED BHU: Yes.   Is the population mix appropriate for patient: Yes.   Is the patient awaiting placement in inpatient or outpatient setting: No. Has the patient had a psychiatric consult: No. Survey of unit performed for contraband, proper placement and condition of furniture, tampering with fixtures in bathroom, shower, and each patient room: Yes.  ; Findings: NA APPEARANCE/BEHAVIOR calm, cooperative and adequate rapport can be established NEURO ASSESSMENT Orientation: person, place, time Hallucinations: No.None noted (Hallucinations) Speech: Normal Gait: normal RESPIRATORY ASSESSMENT Normal expansion.  Clear to auscultation.  No rales, rhonchi, or wheezing. CARDIOVASCULAR ASSESSMENT regular rate and rhythm, S1, S2 normal, no murmur, click, rub or gallop GASTROINTESTINAL ASSESSMENT soft, nontender, BS WNL, no r/g EXTREMITIES normal strength, tone, and muscle mass PLAN OF CARE Provide calm/safe environment. Vital signs assessed twice daily. ED BHU Assessment once each 12-hour shift. Collaborate with intake RN daily or as condition indicates. Assure the ED provider has rounded once each shift. Provide and encourage hygiene. Provide redirection as needed. Assess for escalating behavior; address immediately and inform ED provider.  Assess family dynamic and appropriateness for visitation as needed: Yes.  ; If necessary, describe findings: NA Educate the patient/family about BHU procedures/visitation: Yes.  ; If necessary, describe findings: NA

## 2016-02-25 ENCOUNTER — Emergency Department
Admission: EM | Admit: 2016-02-25 | Discharge: 2016-02-26 | Disposition: A | Discharge status: Home / Self Care | Payer: Medicare Other | Attending: Emergency Medicine | Admitting: Emergency Medicine

## 2016-02-25 DIAGNOSIS — I10 Essential (primary) hypertension: Secondary | ICD-10-CM | POA: Diagnosis not present

## 2016-02-25 DIAGNOSIS — Z9119 Patient's noncompliance with other medical treatment and regimen: Secondary | ICD-10-CM

## 2016-02-25 DIAGNOSIS — Z9115 Patient's noncompliance with renal dialysis: Secondary | ICD-10-CM

## 2016-02-25 DIAGNOSIS — F17210 Nicotine dependence, cigarettes, uncomplicated: Secondary | ICD-10-CM | POA: Diagnosis not present

## 2016-02-25 DIAGNOSIS — Z79899 Other long term (current) drug therapy: Secondary | ICD-10-CM | POA: Insufficient documentation

## 2016-02-25 DIAGNOSIS — F341 Dysthymic disorder: Secondary | ICD-10-CM

## 2016-02-25 DIAGNOSIS — Z7982 Long term (current) use of aspirin: Secondary | ICD-10-CM | POA: Insufficient documentation

## 2016-02-25 DIAGNOSIS — G8929 Other chronic pain: Secondary | ICD-10-CM

## 2016-02-25 DIAGNOSIS — Z7289 Other problems related to lifestyle: Secondary | ICD-10-CM | POA: Diagnosis not present

## 2016-02-25 DIAGNOSIS — R45851 Suicidal ideations: Secondary | ICD-10-CM | POA: Insufficient documentation

## 2016-02-25 DIAGNOSIS — R300 Dysuria: Secondary | ICD-10-CM

## 2016-02-25 DIAGNOSIS — F602 Antisocial personality disorder: Secondary | ICD-10-CM | POA: Diagnosis present

## 2016-02-25 DIAGNOSIS — Z765 Malingerer [conscious simulation]: Secondary | ICD-10-CM

## 2016-02-25 LAB — CBC
HEMATOCRIT: 38.6 % — AB (ref 40.0–52.0)
HEMOGLOBIN: 13.4 g/dL (ref 13.0–18.0)
MCH: 28.4 pg (ref 26.0–34.0)
MCHC: 34.6 g/dL (ref 32.0–36.0)
MCV: 82 fL (ref 80.0–100.0)
Platelets: 212 10*3/uL (ref 150–440)
RBC: 4.71 MIL/uL (ref 4.40–5.90)
RDW: 14.6 % — ABNORMAL HIGH (ref 11.5–14.5)
WBC: 10.7 10*3/uL — ABNORMAL HIGH (ref 3.8–10.6)

## 2016-02-25 LAB — URINALYSIS COMPLETE WITH MICROSCOPIC (ARMC ONLY)
BACTERIA UA: NONE SEEN
Bilirubin Urine: NEGATIVE
GLUCOSE, UA: NEGATIVE mg/dL
HGB URINE DIPSTICK: NEGATIVE
Ketones, ur: NEGATIVE mg/dL
Leukocytes, UA: NEGATIVE
NITRITE: NEGATIVE
PH: 7 (ref 5.0–8.0)
Protein, ur: >500 mg/dL — AB
SQUAMOUS EPITHELIAL / LPF: NONE SEEN
Specific Gravity, Urine: 1.01 (ref 1.005–1.030)

## 2016-02-25 LAB — BASIC METABOLIC PANEL
Anion gap: 8 (ref 5–15)
BUN: 19 mg/dL (ref 6–20)
CHLORIDE: 103 mmol/L (ref 101–111)
CO2: 28 mmol/L (ref 22–32)
CREATININE: 2.43 mg/dL — AB (ref 0.61–1.24)
Calcium: 9.2 mg/dL (ref 8.9–10.3)
GFR calc non Af Amer: 28 mL/min — ABNORMAL LOW (ref >60)
GFR, EST AFRICAN AMERICAN: 32 mL/min — AB (ref >60)
GLUCOSE: 90 mg/dL (ref 65–99)
Potassium: 2.9 mmol/L — ABNORMAL LOW (ref 3.5–5.1)
Sodium: 139 mmol/L (ref 135–145)

## 2016-02-25 MED ORDER — IBUPROFEN 600 MG PO TABS
600.0000 mg | ORAL_TABLET | Freq: Four times a day (QID) | ORAL | Status: DC | PRN
  Filled 2016-02-25: qty 1

## 2016-02-25 NOTE — ED Provider Notes (Signed)
[facility] Emergency Department Provider Note  ____________________________________________  Time seen: Approximately 9:00 PM  I have reviewed the triage vital signs and the nursing notes.   HISTORY  Chief Complaint Dysuria    HPI [name] is a 60 y.o. male who complains of dysuria for the past 2-3 days. He reports that previously when he had symptoms of dysuria he had to stay in the hospital. No change in urine output or frequency. No blood in the urine. Reports that he is sometimes sexually active and denies the possibility that he could have a sexually-transmitted infection. Denies any penile discharge. No other pain. No nausea vomiting diarrhea fever chills. No chest pain shortness of breath or dizziness.     Past Medical History:  Diagnosis Date  . Hypertension   . PE (pulmonary embolism)   . Renal disorder      Patient Active Problem List   Diagnosis Date Noted  . Opiate abuse, continuous 02/03/2016  . Substance induced mood disorder ([place]) 02/03/2016  . Antisocial personality disorder 02/03/2016  . Left-sided weakness 01/18/2016  . HEPATITIS C 06/28/2008  . HYPERLIPIDEMIA 06/28/2008  . SUBSTANCE ABUSE, MULTIPLE 06/28/2008  . Hypertension 06/27/2008     Past Surgical History:  Procedure Laterality Date  . TOTAL HIP ARTHROPLASTY       Prior to Admission medications   Medication Sig Start Date End Date Taking? Authorizing Provider  aluminum-magnesium hydroxide-simethicone (MAALOX) [id number] MG/5ML SUSP Take 30 mLs by mouth 4 (four) times daily -  before meals and at bedtime. 02/02/16   Carrie Mew, MD  amLODipine (NORVASC) 10 MG tablet Take 10 mg by mouth daily. 11/27/15   Historical Provider, MD  aspirin EC 81 MG tablet Take 81 mg by mouth daily.    Historical Provider, MD  atorvastatin (LIPITOR) 40 MG tablet Take 40 mg by mouth daily. 11/27/15   Historical Provider, MD  famotidine (PEPCID) 20 MG tablet Take 1 tablet (20  mg total) by mouth 2 (two) times daily. 02/02/16   Carrie Mew, MD  hydrALAZINE (APRESOLINE) 100 MG tablet Take 1 tablet (100 mg total) by mouth 3 (three) times daily. 01/18/16   Max Sane, MD  hydrochlorothiazide (HYDRODIURIL) 25 MG tablet Take 25 mg by mouth daily. 11/27/15   Historical Provider, MD  polyethylene glycol (MIRALAX / GLYCOLAX) packet Take 17 g by mouth as needed. 11/27/15   Historical Provider, MD  potassium chloride (K-DUR) 10 MEQ tablet Take 1 tablet (10 mEq total) by mouth daily. 02/03/16   Rudene Re, MD  tamsulosin (FLOMAX) 0.4 MG CAPS capsule Take 1 capsule by mouth daily. 11/03/15   Historical Provider, MD  traZODone (DESYREL) 100 MG tablet Take 100 mg by mouth at bedtime as needed. 10/22/15   Historical Provider, MD     Allergies Cyclobenzaprine; Clonidine; Furosemide; and Tylenol [acetaminophen]   No family history on file.  Social History Social History  Substance Use Topics  . Smoking status: Current Every Day Smoker    Packs/day: 0.50    Types: Cigarettes  . Smokeless tobacco: Never Used  . Alcohol use No    Review of Systems  Constitutional:   No fever or chills.  ENT:   No sore throat. No rhinorrhea. Cardiovascular:   No chest pain. Respiratory:   No dyspnea or cough. Gastrointestinal:   Negative for abdominal pain, vomiting and diarrhea.  Genitourinary:   Positive dysuria. Musculoskeletal:   Negative for focal pain or swelling Neurological:   Negative for headaches 10-point ROS  otherwise negative.  ____________________________________________   PHYSICAL EXAM:  VITAL SIGNS: ED Triage Vitals  Enc Vitals Group     BP 02/25/16 1934 (!) 152/94     Pulse Rate 02/25/16 1934 89     Resp --      Temp 02/25/16 1934 98.5 F (36.9 C)     Temp Source 02/25/16 1934 Oral     SpO2 02/25/16 1934 93 %     Weight 02/25/16 1935 266 lb (120.7 kg)     Height 02/25/16 1935 6' (1.829 m)     Head Circumference --      Peak Flow --      Pain Score  02/25/16 1941 8     Pain Loc --      Pain Edu? --      Excl. in [place]? --     Vital signs reviewed, nursing assessments reviewed.   Constitutional:   Alert and oriented. Well appearing and in no distress. Eyes:   No scleral icterus. No conjunctival pallor. PERRL. EOMI.  No nystagmus. ENT   Head:   Normocephalic and atraumatic.   Nose:   No congestion/rhinnorhea. No septal hematoma   Mouth/Throat:   MMM, no pharyngeal erythema. No peritonsillar mass.    Neck:   No stridor. No SubQ emphysema. No meningismus. Hematological/Lymphatic/Immunilogical:   No cervical lymphadenopathy. Cardiovascular:   RRR. Symmetric bilateral radial and DP pulses.  No murmurs.  Respiratory:   Normal respiratory effort without tachypnea nor retractions. Breath sounds are clear and equal bilaterally. No wheezes/rales/rhonchi. Gastrointestinal:   Soft and nontender. Non distended. There is no CVA tenderness.  No rebound, rigidity, or guarding. Genitourinary:   Normal external genitalia, uncircumcised. No penile discharge, no lesions. Testes nontender, no scrotal tenderness or swelling.. Musculoskeletal:   Nontender with normal range of motion in all extremities. No joint effusions.  No lower extremity tenderness.  No edema. Neurologic:   Normal speech and language.  CN 2-10 normal. Ambulatory Motor grossly intact. No gross focal neurologic deficits are appreciated.  Skin:    Skin is warm, dry and intact. No rash noted.  No petechiae, purpura, or bullae.  ____________________________________________    LABS (pertinent positives/negatives) (all labs ordered are listed, but only abnormal results are displayed) Labs Reviewed  CBC - Abnormal; Notable for the following:       Result Value   WBC 10.7 (*)    HCT 38.6 (*)    RDW 14.6 (*)    All other components within normal limits  BASIC METABOLIC PANEL - Abnormal; Notable for the following:    Potassium 2.9 (*)    Creatinine, Ser 2.43 (*)    GFR  calc non Af Amer 28 (*)    GFR calc Af Amer 32 (*)    All other components within normal limits  URINALYSIS COMPLETEWITH MICROSCOPIC ([facility] ONLY) - Abnormal; Notable for the following:    Color, Urine YELLOW (*)    APPearance CLEAR (*)    Protein, ur >500 (*)    All other components within normal limits  URINE CULTURE   ____________________________________________   EKG    ____________________________________________    RADIOLOGY    ____________________________________________   PROCEDURES Procedures  ____________________________________________   INITIAL IMPRESSION / ASSESSMENT AND PLAN / ED COURSE  Pertinent labs & imaging results that were available during my care of the patient were reviewed by me and considered in my medical decision making (see chart for details).  Patient well appearing no acute distress. Complains of  dysuria. Labs her chronic baseline including CK D and chronic mild hypokalemia. Not requiring any urgent intervention. Urinalysis shows no evidence of urinary tract infection. Urine culture added on. After I discussed the reassuring results with the patient and indicated that he is suitable for discharge home and should follow up with his primary care doctor, he agreed that he has a primary care doctor that he can follow-up, but then went out to the lobby and reported that he was feeling suicidal and had a plan to shoot himself with a gun when he got home.  Reviewing the medical record does reveal that the patient has history of the same, reporting suicidal ideation in apparent attempts to manipulate the medical system and obtain narcotics. He does have a history of polysubstance abuse and opiate abuse. He was last seen by psychiatry about 3 weeks ago for very similar issues and was deemed to be psychiatrically stable and discharged home. However, because of the specificity of his current statements, we will hold him in the ED under involuntary commitment  until morning when he can be reassessed for safety.  Due to his issues with opioid abuse, we will avoid giving any controlled substances to the patient during his stay in the emergency department.     Clinical Course   ____________________________________________   FINAL CLINICAL IMPRESSION(S) / ED DIAGNOSES  Final diagnoses:  Dysuria  Suicidal ideation  Drug-seeking behavior       Portions of this note were generated with dragon dictation software. Dictation errors may occur despite best attempts at proofreading.    Carrie Mew, MD 02/25/16 2203

## 2016-02-25 NOTE — ED Notes (Signed)
While sitting,pt stated "There is someone that I want to get even with before I do this. It is a doctor." Also states that it is a doctor "I seen somewhere else. I never had a problem with these doctors here."

## 2016-02-25 NOTE — ED Notes (Signed)
Report given to [facility] RN

## 2016-02-25 NOTE — ED Triage Notes (Addendum)
Patient ambulatory to triage with steady gait, without difficulty or distress noted; pt reports dysuria and hesitancy since yesterday; denies abd/back pain other than his chronic back pain issues; st "my kidney ain't flowing right and they had to put me in the hospital before to get it working again"

## 2016-02-25 NOTE — ED Notes (Signed)
Pt given meal tray and soda

## 2016-02-25 NOTE — ED Notes (Signed)
Wynelle Beckmann RN, was contacted about taking the patient to [facility]. After reviewing the chart, Caren Griffins called back and said that Calvin from intake would come and interview to see if he is appropriate for the [place].

## 2016-02-25 NOTE — ED Notes (Signed)
[facility] ED tech is 1:1 sitter. Charge RN is aware of impending suicidal ideation.

## 2016-02-25 NOTE — ED Notes (Signed)
Patient states that he told the RN in the lobby that he was going to go home and kill himself. He was escorted back to this room and was interviewed by this Probation officer. Patient states that he has too many medical problems that he doesn't see getting any better, so it's just would be better to "end it." Dr. Joni Fears is aware.

## 2016-02-25 NOTE — BH Assessment (Signed)
Assessment Note  [name] is an 59 y.o. male who presents to the ER, via his brother due to depression and SI. His plan is "to blow my brains out." Patient states he doesn't own a gun, but have access to one. When asked about the specifics stressors and cause of his depression, he initially states he doesn't know. Then he states he was upset with a doctor, who lied on him about using cocaine. "He talked ugly to me. He put me down and was rude."  Due to what the doctor reported, it almost prevented him from getting his hip surgery. After stating this, the patient then shared, he had his hip surgery and was doing well.  Patient also reports of having A/H, telling him to end his life. When asked what the voices were saying and his age when they first started, he stated he doesn't know. He was unable to share if they were a whisper, mumble or if they were clear. After being asked about the A/H, the patient started talking about how upset he was about the doctor who accused him of cocaine use.  He denies any current involvement with legal system and with DSS. During the interview, he was calm, cooperative and polite. He denies HI and V/H and continues to voice SI and A/H.  Diagnosis: Depression  Past Medical History:  Past Medical History:  Diagnosis Date  . Hypertension   . PE (pulmonary embolism)   . Renal disorder     Past Surgical History:  Procedure Laterality Date  . TOTAL HIP ARTHROPLASTY      Family History: No family history on file.  Social History:  reports that he has been smoking Cigarettes.  He has been smoking about 0.50 packs per day. He has never used smokeless tobacco. He reports that he does not drink alcohol or use drugs.  Additional Social History:  Alcohol / Drug Use Pain Medications: See PTA  Prescriptions: See PTA  Over the Counter: See PTA  History of alcohol / drug use?: No history of alcohol / drug abuse Longest period of sobriety (when/how long):  Reports of no past or current substance use Negative Consequences of Use:  (Reports of none) Withdrawal Symptoms:  (Reports of none)  CIWA: CIWA-Ar BP: (!) 152/98 Pulse Rate: 70 COWS:    Allergies:  Allergies  Allergen Reactions  . Cyclobenzaprine Nausea Only  . Clonidine Other (See Comments)    Asymptomatic bradycardia to 38  . Furosemide Other (See Comments)    Caused renal failure  . Tylenol [Acetaminophen] Other (See Comments)    Reaction:  Pt states it bothers his liver    Home Medications:  (Not in a hospital admission)  OB/GYN Status:  No LMP for male patient.  General Assessment Data Location of Assessment: [facility] ED TTS Assessment: In system Is this a Tele or Face-to-Face Assessment?: Face-to-Face Is this an Initial Assessment or a Re-assessment for this encounter?: Initial Assessment Marital status: Divorced [place] name: n/a Is patient pregnant?: No Pregnancy Status: No Living Arrangements: Other relatives Can pt return to current living arrangement?: Yes Admission Status: Involuntary Is patient capable of signing voluntary admission?: No Referral Source: Self/Family/Friend Insurance type: Medicare A&B  Medical Screening Exam ([place]) Medical Exam completed: Yes  Crisis Care Plan Living Arrangements: Other relatives Legal Guardian: Other: (None) Name of Psychiatrist: Reports of none Name of Therapist: Reports of none  Education Status Is patient currently in school?: No Current Grade: n/a Highest grade of  school patient has completed: Production assistant, radio (Major in Theology) Name of school: N/A Contact person: N/A  Risk to self with the past 6 months Suicidal Ideation: Yes-Currently Present Has patient been a risk to self within the past 6 months prior to admission? : Yes Suicidal Intent: Yes-Currently Present Has patient had any suicidal intent within the past 6 months prior to admission? : Yes Is patient at risk for suicide?: Yes Suicidal  Plan?: Yes-Currently Present Has patient had any suicidal plan within the past 6 months prior to admission? : Yes Specify Current Suicidal Plan: "Blow my brains out." Access to Means: Yes Specify Access to Suicidal Means: "I can get access to a gun." What has been your use of drugs/alcohol within the last 12 months?: Reports of none Previous Attempts/Gestures: No How many times?: 0 Other Self Harm Risks: Reports of none Triggers for Past Attempts: None known Intentional Self Injurious Behavior: None Family Suicide History: No Recent stressful life event(s): Other (Comment), Conflict (Comment) ("I been depressed.") Persecutory voices/beliefs?: No Depression: Yes Depression Symptoms: Loss of interest in usual pleasures, Feeling worthless/self pity, Feeling angry/irritable, Guilt, Fatigue, Isolating, Tearfulness Substance abuse history and/or treatment for substance abuse?: No Suicide prevention information given to non-admitted patients: Not applicable  Risk to Others within the past 6 months Homicidal Ideation: No Does patient have any lifetime risk of violence toward others beyond the six months prior to admission? : No Thoughts of Harm to Others: No Current Homicidal Intent: No Current Homicidal Plan: No Access to Homicidal Means: No Identified Victim: Reports of none History of harm to others?: No Assessment of Violence: None Noted Violent Behavior Description: Reports of none Does patient have access to weapons?: No Criminal Charges Pending?: No Does patient have a court date: No Is patient on probation?: No  Psychosis Hallucinations: Auditory Delusions: None noted  Mental Status Report Appearance/Hygiene: Unremarkable, In scrubs, In hospital gown Eye Contact: Fair Motor Activity: Freedom of movement, Unremarkable Speech: Logical/coherent Level of Consciousness: Alert Mood: Depressed, Helpless, Pleasant Affect: Anxious, Depressed Anxiety Level: Minimal Thought  Processes: Coherent, Relevant Judgement: Unimpaired Orientation: Person, Place, Time, Situation, Appropriate for developmental age Obsessive Compulsive Thoughts/Behaviors: Minimal  Cognitive Functioning Concentration: Normal Memory: Recent Intact, Remote Intact IQ: Average Insight: Fair Impulse Control: Fair Appetite: Good Weight Loss: 0 Weight Gain: 0 Sleep: No Change Total Hours of Sleep: 5 Vegetative Symptoms: None  ADLScreening [facility] Assessment Services) Patient's cognitive ability adequate to safely complete daily activities?: Yes Patient able to express need for assistance with ADLs?: Yes Independently performs ADLs?: Yes (appropriate for developmental age)  Prior Inpatient Therapy Prior Inpatient Therapy: Yes Prior Therapy Dates: 2016 Prior Therapy Facilty/Provider(s): [facility], [facility], [place], [place] Reason for Treatment: depression   Prior Outpatient Therapy Prior Outpatient Therapy: Yes Prior Therapy Dates: 2016 Prior Therapy Facilty/Provider(s): [facility] Reason for Treatment: Depression Does patient have an ACCT team?: No Does patient have Intensive In-House Services?  : No Does patient have [place] services? : No Does patient have P4CC services?: No  ADL Screening (condition at time of admission) Patient's cognitive ability adequate to safely complete daily activities?: Yes Is the patient deaf or have difficulty hearing?: No Does the patient have difficulty seeing, even when wearing glasses/contacts?: No Does the patient have difficulty concentrating, remembering, or making decisions?: No Patient able to express need for assistance with ADLs?: Yes Does the patient have difficulty dressing or bathing?: No Independently performs ADLs?: Yes (appropriate for developmental age) Does the patient have difficulty walking or  climbing stairs?: No Weakness of Legs: None Weakness of Arms/Hands: None  Home Assistive Devices/Equipment Home  Assistive Devices/Equipment: None  Therapy Consults (therapy consults require a physician order) PT Evaluation Needed: No OT Evalulation Needed: No SLP Evaluation Needed: No Abuse/Neglect Assessment (Assessment to be complete while patient is alone) Physical Abuse: Denies Verbal Abuse: Denies Sexual Abuse: Denies Exploitation of patient/patient's resources: Denies Self-Neglect: Denies Values / Beliefs Cultural Requests During Hospitalization: None Spiritual Requests During Hospitalization: None Consults Spiritual Care Consult Needed: No Social Work Consult Needed: No Regulatory affairs officer (For Healthcare) Does patient have an advance directive?: No Would patient like information on creating an advanced directive?: No - patient declined information    Additional Information 1:1 In Past 12 Months?: No CIRT Risk: No Elopement Risk: No Does patient have medical clearance?: Yes  Child/Adolescent Assessment Running Away Risk: Denies (Patient is an adult )  Disposition:  Disposition Initial Assessment Completed for this Encounter: Yes Disposition of Patient: Other dispositions (ER MD ordered Psych Consult)  On Site Evaluation by:   Reviewed with Physician:    Gunnar Fusi MS, LCAS, [place], [place], CCSI Therapeutic Triage Specialist 02/25/2016 11:22 PM

## 2016-02-25 NOTE — ED Notes (Signed)
Pt in via triage; pt states, "my kidneys hurt."  Pt reports left lower back pain and dysuria since yesterday.  Pt reports hx of cyst on kidney, denies any hx of kidney stones, denies blood in urine, denies any frequency/urgency.  Pt A/Ox4, no immediate distress at this time.

## 2016-02-25 NOTE — ED Notes (Addendum)
Pt was sitting outside on the bench stop this nurse and said " I want to kill myself, what do I need to do." This nurse asked the patient's name and escorted to triage desk to find out if he was a current patient or just discharged. Pt was escorted back to room 18. Lattie Haw triage nurse made Sonja RN patient current nurse aware of SI statements and that he was outside on the bench.

## 2016-02-26 DIAGNOSIS — F341 Dysthymic disorder: Secondary | ICD-10-CM

## 2016-02-26 DIAGNOSIS — G8929 Other chronic pain: Secondary | ICD-10-CM

## 2016-02-26 DIAGNOSIS — Z9119 Patient's noncompliance with other medical treatment and regimen: Secondary | ICD-10-CM

## 2016-02-26 DIAGNOSIS — Z9115 Patient's noncompliance with renal dialysis: Secondary | ICD-10-CM

## 2016-02-26 LAB — URINE DRUG SCREEN, QUALITATIVE (ARMC ONLY)
AMPHETAMINES, UR SCREEN: NOT DETECTED
BENZODIAZEPINE, UR SCRN: NOT DETECTED
Barbiturates, Ur Screen: NOT DETECTED
COCAINE METABOLITE, UR ~~LOC~~: NOT DETECTED
Cannabinoid 50 Ng, Ur ~~LOC~~: NOT DETECTED
MDMA (Ecstasy)Ur Screen: NOT DETECTED
METHADONE SCREEN, URINE: NOT DETECTED
OPIATE, UR SCREEN: NOT DETECTED
Phencyclidine (PCP) Ur S: NOT DETECTED
TRICYCLIC, UR SCREEN: NOT DETECTED

## 2016-02-26 MED ORDER — LORAZEPAM 1 MG PO TABS
ORAL_TABLET | ORAL | Status: AC
  Administered 2016-02-26: 1 mg via ORAL
  Filled 2016-02-26: qty 1

## 2016-02-26 MED ORDER — POTASSIUM CHLORIDE CRYS ER 20 MEQ PO TBCR
60.0000 meq | EXTENDED_RELEASE_TABLET | Freq: Once | ORAL | Status: AC
  Administered 2016-02-26: 60 meq via ORAL

## 2016-02-26 MED ORDER — POTASSIUM CHLORIDE CRYS ER 20 MEQ PO TBCR
EXTENDED_RELEASE_TABLET | ORAL | Status: AC
  Filled 2016-02-26: qty 3

## 2016-02-26 MED ORDER — LORAZEPAM 1 MG PO TABS
1.0000 mg | ORAL_TABLET | Freq: Once | ORAL | Status: AC
  Administered 2016-02-26: 1 mg via ORAL

## 2016-02-26 NOTE — ED Provider Notes (Signed)
-----------------------------------------   7:05 AM on 02/26/2016 -----------------------------------------   Blood pressure (!) 152/98, pulse 70, temperature 98.5 F (36.9 C), temperature source Oral, height 6' (1.829 m), weight 266 lb (120.7 kg), SpO2 99 %.  The patient had no acute events since last update.  Calm and cooperative at this time.  Disposition is pending per Psychiatry/Behavioral Medicine team recommendations.     Paulette Blanch, MD 02/26/16 [phone]

## 2016-02-26 NOTE — ED Notes (Signed)
Pt. Transferred to [place] from ED to room 2. Report to include Situation, Background, Assessment and Recommendations from [facility]. Pt. Oriented to unit including Q15 minute rounds as well as the security cameras for their protection. Patient is alert and oriented, warm and dry in no acute distress. Patient denies SI, and AVH. Pt. States he is having HI but wont elaborate on who he wants to hurt. Pt. Encouraged to let me know if needs arise.

## 2016-02-26 NOTE — ED Notes (Signed)
Nurse administered po ativan per Dr. Izola Price, Patient was becoming agitated, and cursing, stating that I am leaving here now, I am not waiting for " No paperwork" Dr. Ree Kida and ordered Ativan.

## 2016-02-26 NOTE — ED Notes (Signed)
Patient discharged per Dr. Weber Cooks, patient ambulated out, all belongings given to Patient. Patient states that He has a ride.

## 2016-02-26 NOTE — ED Notes (Signed)
Patient noted sleeping in room. No complaints, stable, in no acute distress. Q15 minute rounds and monitoring via Security Cameras to continue.  

## 2016-02-26 NOTE — ED Notes (Signed)
Patient is alert and oriented, states that He still feels Si, but that His pain in His back and hip is making him feel depressed also, patient just wants to talk about pain medication at this time, states that He takes oxycontin at home and that He needs it as soon as possible, nurse told Him that she would talk to doctor. Patient is safe q 15 min. Checks, and camera monitoring. i

## 2016-02-26 NOTE — ED Notes (Signed)
Patient noted in sleeping room. No complaints, stable, in no acute distress. Q15 minute rounds and monitoring via [employer] to continue.

## 2016-02-26 NOTE — ED Provider Notes (Addendum)
Patient well-known to this department has been evaluated by Dr. Shelbie Ammons at bedside. Currently not felt to be a threat to himself or others. Appears medically and psychologically stable for outpatient management. Patient remained hemodynamic stable and noted acute distress. Will discharge patient for outpatient follow-up.  Have discussed with the patient and available family all diagnostics and treatments performed thus far and all questions were answered to the best of my ability. The patient demonstrates understanding and agreement with plan.    Merlyn Lot, MD 02/26/16 1603    Merlyn Lot, MD 02/26/16 [phone]

## 2016-02-26 NOTE — Consult Note (Signed)
[facility] Consult   Reason for Consult:  Consult for this 60 year old man with a history of recurrent complaints of depression and chronic pain Referring Physician:  Quentin Cornwall Patient Identification: [name] MRN:  [id number] Principal Diagnosis: Dysthymia Diagnosis:   Patient Active Problem List   Diagnosis Date Noted  . Dysthymia [F34.1] 02/26/2016  . Chronic pain [G89.29] 02/26/2016  . Noncompliance [Z91.19] 02/26/2016  . Opiate abuse, continuous [F11.10] 02/03/2016  . Substance induced mood disorder ([place]) [F19.94] 02/03/2016  . Antisocial personality disorder [F60.2] 02/03/2016  . Left-sided weakness [M62.89] 01/18/2016  . HEPATITIS C [B17.10] 06/28/2008  . HYPERLIPIDEMIA [E78.5] 06/28/2008  . SUBSTANCE ABUSE, MULTIPLE [F19.10] 06/28/2008  . Hypertension [I10] 06/27/2008    Total Time spent with patient: 1 hour  Subjective:   [name] is a 60 y.o. male patient admitted with "I don't know, I'm just depressed".  HPI:  Patient interviewed. Chart reviewed. Labs and vitals reviewed. Patient known to me from multiple prior encounters. 60 year old man came to the emergency room saying he was depressed and he was having suicidal thoughts. On interview today the patient says that he is feeling depressed. He can't tell me exactly how long he has been feeling depressed. He can't describe the depression in any more detail. When asked what it is that is upsetting him and keeping him down he says things like "life in general". Patient says that he is having suicidal thoughts. Denies that he has any plan or intent. When asked to be any more specific about it he says that maybe he will just grab a gun and shoot himself. Admits that he does not have a gun. This is the same thing that he is said multiple previous times when he has come to the ER. Patient admits that he has not followed up with any outpatient mental health treatment. Sleep is stable. Appetite is normal.  Denies auditory or visual hallucinations. Quite concerned about his chronic pain and narcotic pain medicine.  Social history: Patient reports that he lives with his parents. This is been what he says every time we've seen him come through here. He apparently is not working it's not clear what else he might do during the daytime.  Medical history: Had a hip replacement not too long ago. Chronic hip and back pain. We have evidence that he doctor shops for pain medicine. It looks like he most recently got a full month's prescription from her primary caregiver somewhere locally which was a new prescription.  Substance abuse history: Patient is frequently overly concerned about his narcotics and we've had frequent evidence that he takes more than what he is prescribed or inappropriate doses of narcotics. He frequently doctor shops for narcotics. It looks like he did get a month's supply of his oxycodone written recently by a new primary care doctor. He says he does not drink.  Past Psychiatric History: Patient has had many presentations to the emergency room. He's had a couple of admissions to psychiatry. His presentations are almost always exactly the same as what he is doing today. He will always be very vague about his symptoms. He apparently has never actually done anything to try to kill himself. No history known of any violence. He has been referred over and over to local mental health is no evidence that he never follows up with it.  Risk to Self: Suicidal Ideation: Yes-Currently Present Suicidal Intent: Yes-Currently Present Is patient at risk for suicide?: Yes Suicidal Plan?: Yes-Currently Present Specify  Current Suicidal Plan: "Blow my brains out." Access to Means: Yes Specify Access to Suicidal Means: "I can get access to a gun." What has been your use of drugs/alcohol within the last 12 months?: Reports of none How many times?: 0 Other Self Harm Risks: Reports of none Triggers for Past  Attempts: None known Intentional Self Injurious Behavior: None Risk to Others: Homicidal Ideation: No Thoughts of Harm to Others: No Current Homicidal Intent: No Current Homicidal Plan: No Access to Homicidal Means: No Identified Victim: Reports of none History of harm to others?: No Assessment of Violence: None Noted Violent Behavior Description: Reports of none Does patient have access to weapons?: No Criminal Charges Pending?: No Does patient have a court date: No Prior Inpatient Therapy: Prior Inpatient Therapy: Yes Prior Therapy Dates: 2016 Prior Therapy Facilty/Provider(s): [facility], [facility], [place], [place] Reason for Treatment: depression  Prior Outpatient Therapy: Prior Outpatient Therapy: Yes Prior Therapy Dates: 2016 Prior Therapy Facilty/Provider(s): [facility] Reason for Treatment: Depression Does patient have an ACCT team?: No Does patient have Intensive In-House Services?  : No Does patient have [facility] services? : No Does patient have P4CC services?: No  Past Medical History:  Past Medical History:  Diagnosis Date  . Hypertension   . PE (pulmonary embolism)   . Renal disorder     Past Surgical History:  Procedure Laterality Date  . TOTAL HIP ARTHROPLASTY     Family History: No family history on file. Family Psychiatric  History: Patient denies knowing of any family history of mental illness Social History:  History  Alcohol Use No     History  Drug Use No    Social History   Social History  . Marital status: Divorced    Spouse name: N/A  . Number of children: N/A  . Years of education: N/A   Social History Main Topics  . Smoking status: Current Every Day Smoker    Packs/day: 0.50    Types: Cigarettes  . Smokeless tobacco: Never Used  . Alcohol use No  . Drug use: No  . Sexual activity: Not on file   Other Topics Concern  . Not on file   Social History Narrative  . No narrative on file   Additional Social  History:    Allergies:   Allergies  Allergen Reactions  . Cyclobenzaprine Nausea Only  . Clonidine Other (See Comments)    Asymptomatic bradycardia to 38  . Furosemide Other (See Comments)    Caused renal failure  . Tylenol [Acetaminophen] Other (See Comments)    Reaction:  Pt states it bothers his liver    Labs:  Results for orders placed or performed during the hospital encounter of 02/25/16 (from the past 48 hour(s))  CBC     Status: Abnormal   Collection Time: 02/25/16  7:43 PM  Result Value Ref Range   WBC 10.7 (H) 3.8 - 10.6 K/uL   RBC 4.71 4.40 - 5.90 MIL/uL   Hemoglobin 13.4 13.0 - 18.0 g/dL   HCT 38.6 (L) 40.0 - 52.0 %   MCV 82.0 80.0 - 100.0 fL   MCH 28.4 26.0 - 34.0 pg   MCHC 34.6 32.0 - 36.0 g/dL   RDW 14.6 (H) 11.5 - 14.5 %   Platelets 212 150 - 440 K/uL  Basic metabolic panel     Status: Abnormal   Collection Time: 02/25/16  7:43 PM  Result Value Ref Range   Sodium 139 135 - 145 mmol/L   Potassium  2.9 (L) 3.5 - 5.1 mmol/L   Chloride 103 101 - 111 mmol/L   CO2 28 22 - 32 mmol/L   Glucose, Bld 90 65 - 99 mg/dL   BUN 19 6 - 20 mg/dL   Creatinine, Ser 2.43 (H) 0.61 - 1.24 mg/dL   Calcium 9.2 8.9 - 10.3 mg/dL   GFR calc non Af Amer 28 (L) >60 mL/min   GFR calc Af Amer 32 (L) >60 mL/min    Comment: (NOTE) The eGFR has been calculated using the CKD EPI equation. This calculation has not been validated in all clinical situations. eGFR's persistently <60 mL/min signify possible Chronic Kidney Disease.    Anion gap 8 5 - 15  Urinalysis complete, with microscopic ([facility] only)     Status: Abnormal   Collection Time: 02/25/16  7:43 PM  Result Value Ref Range   Color, Urine YELLOW (A) YELLOW   APPearance CLEAR (A) CLEAR   Glucose, UA NEGATIVE NEGATIVE mg/dL   Bilirubin Urine NEGATIVE NEGATIVE   Ketones, ur NEGATIVE NEGATIVE mg/dL   Specific Gravity, Urine 1.010 1.005 - 1.030   Hgb urine dipstick NEGATIVE NEGATIVE   pH 7.0 5.0 - 8.0   Protein, ur >500 (A)  NEGATIVE mg/dL   Nitrite NEGATIVE NEGATIVE   Leukocytes, UA NEGATIVE NEGATIVE   RBC / HPF 0-5 0 - 5 RBC/hpf   WBC, UA 0-5 0 - 5 WBC/hpf   Bacteria, UA NONE SEEN NONE SEEN   Squamous Epithelial / LPF NONE SEEN NONE SEEN  Urine Drug Screen, Qualitative ([facility] only)     Status: None   Collection Time: 02/25/16  7:43 PM  Result Value Ref Range   Tricyclic, Ur Screen NONE DETECTED NONE DETECTED   Amphetamines, Ur Screen NONE DETECTED NONE DETECTED   MDMA (Ecstasy)Ur Screen NONE DETECTED NONE DETECTED   Cocaine Metabolite,Ur [place] NONE DETECTED NONE DETECTED   Opiate, Ur Screen NONE DETECTED NONE DETECTED   Phencyclidine (PCP) Ur S NONE DETECTED NONE DETECTED   Cannabinoid 50 Ng, Ur [place] NONE DETECTED NONE DETECTED   Barbiturates, Ur Screen NONE DETECTED NONE DETECTED   Benzodiazepine, Ur Scrn NONE DETECTED NONE DETECTED   Methadone Scn, Ur NONE DETECTED NONE DETECTED    Comment: (NOTE) 664  Tricyclics, urine               Cutoff 1000 ng/mL 200  Amphetamines, urine             Cutoff 1000 ng/mL 300  MDMA (Ecstasy), urine           Cutoff 500 ng/mL 400  Cocaine Metabolite, urine       Cutoff 300 ng/mL 500  Opiate, urine                   Cutoff 300 ng/mL 600  Phencyclidine (PCP), urine      Cutoff 25 ng/mL 700  Cannabinoid, urine              Cutoff 50 ng/mL 800  Barbiturates, urine             Cutoff 200 ng/mL 900  Benzodiazepine, urine           Cutoff 200 ng/mL 1000 Methadone, urine                Cutoff 300 ng/mL 1100 1200 The urine drug screen provides only a preliminary, unconfirmed 1300 analytical test result and should not be used for non-medical 1400 purposes. Clinical consideration and professional judgment  should 1500 be applied to any positive drug screen result due to possible 1600 interfering substances. A more specific alternate chemical method 1700 must be used in order to obtain a confirmed analytical result.  1800 Gas chromato graphy / mass spectrometry (GC/MS) is the  preferred 1900 confirmatory method.     Current Facility-Administered Medications  Medication Dose Route Frequency Provider Last Rate Last Dose  . ibuprofen (ADVIL,MOTRIN) tablet 600 mg  600 mg Oral Q6H PRN Carrie Mew, MD      . LORazepam (ATIVAN) 1 MG tablet           . LORazepam (ATIVAN) tablet 1 mg  1 mg Oral Once Merlyn Lot, MD      . potassium chloride SA (K-DUR,KLOR-CON) 20 MEQ CR tablet            Current Outpatient Prescriptions  Medication Sig Dispense Refill  . aluminum-magnesium hydroxide-simethicone (MAALOX) [phone] MG/5ML SUSP Take 30 mLs by mouth 4 (four) times daily -  before meals and at bedtime. 355 mL 0  . amLODipine (NORVASC) 10 MG tablet Take 10 mg by mouth daily.  11  . aspirin EC 81 MG tablet Take 81 mg by mouth daily.    Marland Kitchen atorvastatin (LIPITOR) 40 MG tablet Take 40 mg by mouth daily.  0  . famotidine (PEPCID) 20 MG tablet Take 1 tablet (20 mg total) by mouth 2 (two) times daily. 60 tablet 0  . hydrALAZINE (APRESOLINE) 100 MG tablet Take 1 tablet (100 mg total) by mouth 3 (three) times daily. 90 tablet 0  . hydrochlorothiazide (HYDRODIURIL) 25 MG tablet Take 25 mg by mouth daily.  11  . polyethylene glycol (MIRALAX / GLYCOLAX) packet Take 17 g by mouth as needed.  0  . potassium chloride (K-DUR) 10 MEQ tablet Take 1 tablet (10 mEq total) by mouth daily. 30 tablet 1  . tamsulosin (FLOMAX) 0.4 MG CAPS capsule Take 1 capsule by mouth daily.  5  . traZODone (DESYREL) 100 MG tablet Take 100 mg by mouth at bedtime as needed.      Musculoskeletal: Strength & Muscle Tone: within normal limits Gait & Station: normal Patient leans: N/A  Psychiatric Specialty Exam: Physical Exam  Nursing note and vitals reviewed. Constitutional: He appears well-developed and well-nourished.  HENT:  Head: Normocephalic and atraumatic.  Eyes: Conjunctivae are normal. Pupils are equal, round, and reactive to light.  Neck: Normal range of motion.  Cardiovascular: Normal  heart sounds.   Respiratory: Effort normal. No respiratory distress.  GI: Soft.  Musculoskeletal: Normal range of motion.  Neurological: He is alert.  Skin: Skin is warm and dry.  Psychiatric: His affect is blunt. His speech is delayed. He is slowed. Cognition and memory are normal. He expresses impulsivity. He expresses no suicidal plans.    Review of Systems  Constitutional: Negative.   HENT: Negative.   Eyes: Negative.   Respiratory: Negative.   Cardiovascular: Negative.   Gastrointestinal: Negative.   Musculoskeletal: Positive for back pain.  Skin: Negative.   Neurological: Negative.   Psychiatric/Behavioral: Positive for depression. Negative for hallucinations, memory loss, substance abuse and suicidal ideas. The patient is not nervous/anxious and does not have insomnia.     Blood pressure (!) 142/97, pulse 68, temperature 98.1 F (36.7 C), temperature source Oral, resp. rate 20, height 6' (1.829 m), weight 120.7 kg (266 lb), SpO2 98 %.Body mass index is 36.08 kg/m.  General Appearance: Casual  Eye Contact:  Fair  Speech:  Slow  Volume:  Decreased  Mood:  Depressed  Affect:  Depressed  Thought Process:  Goal Directed  Orientation:  Full (Time, Place, and Person)  Thought Content:  Logical  Suicidal Thoughts:  No  Homicidal Thoughts:  No  Memory:  Immediate;   Fair Recent;   Fair Remote;   Fair  Judgement:  Fair  Insight:  Fair  Psychomotor Activity:  Decreased  Concentration:  Concentration: Fair  Recall:  [employer] of Knowledge:  Fair  Language:  Fair  Akathisia:  No  Handed:  Right  AIMS (if indicated):     Assets:  Physical Health Resilience  ADL's:  Intact  Cognition:  Impaired,  Mild  Sleep:        Treatment Plan Summary: Plan 59 year old man who is frequently reporting chronic depression. Makes very vague suicidal comments but has no intention or plan of harming himself. It's unclear to me whether there may be some chronic cognitive problems with  this gentleman as well. I think another major factor clearly is his ongoing overuse of narcotics. Patient is not meeting commitment criteria. Does not represent an acute clear danger to himself. He has been referred once again to outpatient treatment at [facility]. Education provided about depression and substance abuse treatment. Case reviewed with the ER physician. IVC discontinued.  Disposition: Patient does not meet criteria for psychiatric inpatient admission.  Alethia Berthold, MD 02/26/2016 4:12 PM

## 2016-02-26 NOTE — Discharge Instructions (Signed)
Please follow up with your outpatient primary care provider as well as psychiatrist. Return to the emergency department should he have any additional complaints or concerns.

## 2016-02-26 NOTE — ED Notes (Signed)
Patient endorses SI and HI.  However, he refuses to express who his HI is toward.

## 2016-02-26 NOTE — ED Notes (Signed)
Patient noted in room. No complaints, stable, in no acute distress. Q15 minute rounds and monitoring via [employer] to continue.  

## 2016-02-27 LAB — URINE CULTURE

## 2016-04-28 ENCOUNTER — Encounter (HOSPITAL_COMMUNITY): Payer: Self-pay | Admitting: *Deleted

## 2016-04-28 ENCOUNTER — Emergency Department (HOSPITAL_COMMUNITY)
Admission: EM | Admit: 2016-04-28 | Discharge: 2016-04-29 | Disposition: A | Discharge status: Other Acute Inpatient Hospital | Payer: Medicare Other | Attending: Emergency Medicine | Admitting: Emergency Medicine

## 2016-04-28 DIAGNOSIS — R45851 Suicidal ideations: Secondary | ICD-10-CM | POA: Diagnosis present

## 2016-04-28 DIAGNOSIS — Z7982 Long term (current) use of aspirin: Secondary | ICD-10-CM | POA: Diagnosis not present

## 2016-04-28 DIAGNOSIS — F329 Major depressive disorder, single episode, unspecified: Secondary | ICD-10-CM | POA: Insufficient documentation

## 2016-04-28 DIAGNOSIS — Z96649 Presence of unspecified artificial hip joint: Secondary | ICD-10-CM | POA: Diagnosis not present

## 2016-04-28 DIAGNOSIS — I10 Essential (primary) hypertension: Secondary | ICD-10-CM | POA: Diagnosis not present

## 2016-04-28 DIAGNOSIS — F32A Depression, unspecified: Secondary | ICD-10-CM

## 2016-04-28 DIAGNOSIS — Z79899 Other long term (current) drug therapy: Secondary | ICD-10-CM | POA: Diagnosis not present

## 2016-04-28 DIAGNOSIS — F17210 Nicotine dependence, cigarettes, uncomplicated: Secondary | ICD-10-CM | POA: Insufficient documentation

## 2016-04-28 HISTORY — DX: Major depressive disorder, single episode, unspecified: F32.9

## 2016-04-28 HISTORY — DX: Depression, unspecified: F32.A

## 2016-04-28 LAB — COMPREHENSIVE METABOLIC PANEL
ALBUMIN: 3.1 g/dL — AB (ref 3.5–5.0)
ALK PHOS: 49 U/L (ref 38–126)
ALT: 16 U/L — AB (ref 17–63)
AST: 22 U/L (ref 15–41)
Anion gap: 8 (ref 5–15)
BUN: 25 mg/dL — ABNORMAL HIGH (ref 6–20)
CALCIUM: 8.7 mg/dL — AB (ref 8.9–10.3)
CO2: 24 mmol/L (ref 22–32)
CREATININE: 2.8 mg/dL — AB (ref 0.61–1.24)
Chloride: 107 mmol/L (ref 101–111)
GFR calc non Af Amer: 23 mL/min — ABNORMAL LOW (ref >60)
GFR, EST AFRICAN AMERICAN: 27 mL/min — AB (ref >60)
GLUCOSE: 160 mg/dL — AB (ref 65–99)
Potassium: 3.7 mmol/L (ref 3.5–5.1)
SODIUM: 139 mmol/L (ref 135–145)
Total Bilirubin: 0.7 mg/dL (ref 0.3–1.2)
Total Protein: 6.4 g/dL — ABNORMAL LOW (ref 6.5–8.1)

## 2016-04-28 LAB — SALICYLATE LEVEL: Salicylate Lvl: <7 mg/dL (ref 2.8–30.0)

## 2016-04-28 LAB — CBC
HEMATOCRIT: 37 % — AB (ref 39.0–52.0)
HEMOGLOBIN: 12.5 g/dL — AB (ref 13.0–17.0)
MCH: 27.7 pg (ref 26.0–34.0)
MCHC: 33.8 g/dL (ref 30.0–36.0)
MCV: 82 fL (ref 78.0–100.0)
Platelets: 210 10*3/uL (ref 150–400)
RBC: 4.51 MIL/uL (ref 4.22–5.81)
RDW: 14.7 % (ref 11.5–15.5)
WBC: 8.3 10*3/uL (ref 4.0–10.5)

## 2016-04-28 LAB — ETHANOL: Alcohol, Ethyl (B): <5 mg/dL (ref <5)

## 2016-04-28 LAB — RAPID URINE DRUG SCREEN, HOSP PERFORMED
Amphetamines: NOT DETECTED
BARBITURATES: NOT DETECTED
Benzodiazepines: NOT DETECTED
Cocaine: NOT DETECTED
Opiates: NOT DETECTED
TETRAHYDROCANNABINOL: NOT DETECTED

## 2016-04-28 LAB — ACETAMINOPHEN LEVEL: Acetaminophen (Tylenol), Serum: <10 ug/mL — ABNORMAL LOW (ref 10–30)

## 2016-04-28 MED ORDER — NICOTINE 21 MG/24HR TD PT24
21.0000 mg | MEDICATED_PATCH | Freq: Every day | TRANSDERMAL | Status: DC
  Administered 2016-04-29: 21 mg via TRANSDERMAL
  Filled 2016-04-28: qty 1

## 2016-04-28 MED ORDER — TAMSULOSIN HCL 0.4 MG PO CAPS: 0.4000 mg | ORAL_CAPSULE | Freq: Every day | ORAL | Status: DC | PRN

## 2016-04-28 MED ORDER — LORAZEPAM 1 MG PO TABS: 1.0000 mg | ORAL_TABLET | Freq: Three times a day (TID) | ORAL | Status: DC | PRN

## 2016-04-28 MED ORDER — AMLODIPINE BESYLATE 5 MG PO TABS
10.0000 mg | ORAL_TABLET | Freq: Every day | ORAL | Status: DC
  Administered 2016-04-29: 10 mg via ORAL
  Filled 2016-04-28: qty 2

## 2016-04-28 MED ORDER — ASPIRIN EC 81 MG PO TBEC
81.0000 mg | DELAYED_RELEASE_TABLET | Freq: Every day | ORAL | Status: DC
  Administered 2016-04-29: 81 mg via ORAL
  Filled 2016-04-28: qty 1

## 2016-04-28 MED ORDER — SODIUM CHLORIDE 0.9 % IV SOLN
INTRAVENOUS | Status: AC
  Administered 2016-04-28: via INTRAVENOUS

## 2016-04-28 MED ORDER — HYDRALAZINE HCL 50 MG PO TABS
100.0000 mg | ORAL_TABLET | Freq: Three times a day (TID) | ORAL | Status: DC
  Administered 2016-04-29 (×3): 100 mg via ORAL
  Filled 2016-04-28 (×5): qty 2

## 2016-04-28 MED ORDER — POTASSIUM CHLORIDE CRYS ER 10 MEQ PO TBCR
10.0000 meq | EXTENDED_RELEASE_TABLET | Freq: Every day | ORAL | Status: DC
  Administered 2016-04-29: 10 meq via ORAL
  Filled 2016-04-28: qty 1

## 2016-04-28 MED ORDER — IBUPROFEN 200 MG PO TABS
600.0000 mg | ORAL_TABLET | Freq: Three times a day (TID) | ORAL | Status: DC | PRN
  Administered 2016-04-29: 600 mg via ORAL
  Filled 2016-04-28: qty 1

## 2016-04-28 MED ORDER — TRAZODONE HCL 100 MG PO TABS: 100.0000 mg | ORAL_TABLET | Freq: Every evening | ORAL | Status: DC | PRN

## 2016-04-28 MED ORDER — ONDANSETRON HCL 4 MG PO TABS: 4.0000 mg | ORAL_TABLET | Freq: Three times a day (TID) | ORAL | Status: DC | PRN

## 2016-04-28 MED ORDER — ATORVASTATIN CALCIUM 40 MG PO TABS
40.0000 mg | ORAL_TABLET | Freq: Every day | ORAL | Status: DC
  Administered 2016-04-29: 40 mg via ORAL
  Filled 2016-04-28: qty 1

## 2016-04-28 MED ORDER — HYDROCHLOROTHIAZIDE 25 MG PO TABS
25.0000 mg | ORAL_TABLET | Freq: Every day | ORAL | Status: DC
  Administered 2016-04-29: 25 mg via ORAL
  Filled 2016-04-28: qty 1

## 2016-04-28 MED ORDER — ALUM & MAG HYDROXIDE-SIMETH 200-200-20 MG/5ML PO SUSP: 30.0000 mL | ORAL | Status: DC | PRN

## 2016-04-28 NOTE — ED Provider Notes (Signed)
[facility] DEPT Provider Note   CSN: [id number] Arrival date & time: 04/28/16  1708     History   Chief Complaint Chief Complaint  Patient presents with  . Suicidal    HPI [name] is a 60 y.o. male with a hx of depression, HTN, PE, CKD presents to the Emergency Department complaining of gradual, persistent, progressively worsening suicidal ideation onset 2 days ago.  Pt reports long hx of depression with previous hospitalizations, unknown last.  Pt reports he made a plan to kill himself using a gun.  He reports he has access to a gun.  He also reports some homicidal ideation against "some who crossed me."  He is vague about this and will not offer details.  He reports auditory hallucinations including voices that tell him to "go ahead and kill yourself."  He denies visual hallucinations.  He denies regular EtOH usage, drug usage.  He is an everyday smoker.  No specific aggravating or alleviating factors.  Associated symptoms include low back pain, persistent in nature.  Pt with hx of chronic low back pain and pt reports sitting in the waiting room made it worse. Pt denies loss of bowel or bladder control, gait difficulty, numbness, weakness, etc.    The history is provided by the patient and medical records. No language interpreter was used.    Past Medical History:  Diagnosis Date  . Depression   . Hypertension   . PE (pulmonary embolism)   . Renal disorder     Patient Active Problem List   Diagnosis Date Noted  . Dysthymia 02/26/2016  . Chronic pain 02/26/2016  . Noncompliance 02/26/2016  . Opiate abuse, continuous 02/03/2016  . Substance induced mood disorder ([place]) 02/03/2016  . Antisocial personality disorder 02/03/2016  . Left-sided weakness 01/18/2016  . HEPATITIS C 06/28/2008  . HYPERLIPIDEMIA 06/28/2008  . SUBSTANCE ABUSE, MULTIPLE 06/28/2008  . Hypertension 06/27/2008    Past Surgical History:  Procedure Laterality Date  . TOTAL HIP ARTHROPLASTY          Home Medications    Prior to Admission medications   Medication Sig Start Date End Date Taking? Authorizing Provider  amLODipine (NORVASC) 10 MG tablet Take 10 mg by mouth daily. 11/27/15  Yes Historical Provider, MD  aspirin EC 81 MG tablet Take 81 mg by mouth daily.   Yes Historical Provider, MD  atorvastatin (LIPITOR) 40 MG tablet Take 40 mg by mouth daily. 11/27/15  Yes Historical Provider, MD  hydrALAZINE (APRESOLINE) 100 MG tablet Take 1 tablet (100 mg total) by mouth 3 (three) times daily. 01/18/16  Yes Vipul Manuella Ghazi, MD  hydrochlorothiazide (HYDRODIURIL) 25 MG tablet Take 25 mg by mouth daily. 11/27/15  Yes Historical Provider, MD  oxyCODONE (OXYCONTIN) 20 mg 12 hr tablet Take 20 mg by mouth every 12 (twelve) hours. 04/11/16  Yes Historical Provider, MD  Oxycodone HCl 10 MG TABS Take 10-20 mg by mouth every 4 (four) hours as needed (for pain).  04/11/16  Yes Historical Provider, MD  polyethylene glycol (MIRALAX / GLYCOLAX) packet Take 17 g by mouth daily as needed for mild constipation.  11/27/15  Yes Historical Provider, MD  potassium chloride (K-DUR) 10 MEQ tablet Take 1 tablet (10 mEq total) by mouth daily. 02/03/16  Yes Rudene Re, MD  tamsulosin (FLOMAX) 0.4 MG CAPS capsule Take 0.4 mg by mouth daily as needed (for urinary).  11/03/15  Yes Historical Provider, MD  traZODone (DESYREL) 100 MG tablet Take 100 mg by mouth at bedtime  as needed for sleep.  10/22/15  Yes Historical Provider, MD    Family History No family history on file.  Social History Social History  Substance Use Topics  . Smoking status: Current Every Day Smoker    Packs/day: 0.50    Types: Cigarettes  . Smokeless tobacco: Never Used  . Alcohol use No     Allergies   Cyclobenzaprine; Clonidine; Furosemide; and Tylenol [acetaminophen]   Review of Systems Review of Systems  Psychiatric/Behavioral: Positive for suicidal ideas.  All other systems reviewed and are negative.    Physical  Exam Updated Vital Signs BP 155/95 (BP Location: Left Arm)   Pulse 64   Temp 98.6 F (37 C) (Oral)   Resp 16   Ht 6' (1.829 m)   Wt 117.9 kg   SpO2 100%   BMI 35.26 kg/m   Physical Exam  Constitutional: He appears well-developed and well-nourished. No distress.  Awake, alert, nontoxic appearance  HENT:  Head: Normocephalic and atraumatic.  Mouth/Throat: Oropharynx is clear and moist. No oropharyngeal exudate.  Eyes: Conjunctivae are normal. No scleral icterus.  Neck: Normal range of motion. Neck supple.  Full ROM without pain  Cardiovascular: Normal rate, regular rhythm and intact distal pulses.   Pulmonary/Chest: Effort normal and breath sounds normal. No respiratory distress. He has no wheezes.  Equal chest expansion  Abdominal: Soft. Bowel sounds are normal. He exhibits no distension and no mass. There is no tenderness. There is no rebound and no guarding.  Musculoskeletal: Normal range of motion. He exhibits no edema.  Full range of motion of the T-spine and L-spine No midline tenderness to the  T-spine or L-spine MildTenderness to palpation of the bilateral paraspinous muscles of the L-spine  Lymphadenopathy:    He has no cervical adenopathy.  Neurological: He is alert. He has normal reflexes.  Reflex Scores:      Bicep reflexes are 2+ on the right side and 2+ on the left side.      Brachioradialis reflexes are 2+ on the right side and 2+ on the left side.      Patellar reflexes are 2+ on the right side and 2+ on the left side.      Achilles reflexes are 2+ on the right side and 2+ on the left side. Speech is clear and goal oriented Moves extremities without ataxia  Skin: Skin is warm and dry. No rash noted. He is not diaphoretic. No erythema.  Psychiatric: He has a normal mood and affect. His behavior is normal.  Nursing note and vitals reviewed.    ED Treatments / Results  Labs (all labs ordered are listed, but only abnormal results are displayed) Labs Reviewed   COMPREHENSIVE METABOLIC PANEL - Abnormal; Notable for the following:       Result Value   Glucose, Bld 160 (*)    BUN 25 (*)    Creatinine, Ser 2.80 (*)    Calcium 8.7 (*)    Total Protein 6.4 (*)    Albumin 3.1 (*)    ALT 16 (*)    GFR calc non Af Amer 23 (*)    GFR calc Af Amer 27 (*)    All other components within normal limits  ACETAMINOPHEN LEVEL - Abnormal; Notable for the following:    Acetaminophen (Tylenol), Serum <10 (*)    All other components within normal limits  CBC - Abnormal; Notable for the following:    Hemoglobin 12.5 (*)    HCT 37.0 (*)  All other components within normal limits  URINALYSIS, ROUTINE W REFLEX MICROSCOPIC (NOT AT [facility]) - Abnormal; Notable for the following:    Protein, ur >300 (*)    All other components within normal limits  URINE MICROSCOPIC-ADD ON - Abnormal; Notable for the following:    Squamous Epithelial / LPF 0-5 (*)    Bacteria, UA RARE (*)    Casts HYALINE CASTS (*)    All other components within normal limits  BASIC METABOLIC PANEL - Abnormal; Notable for the following:    BUN 24 (*)    Creatinine, Ser 2.58 (*)    Calcium 8.4 (*)    GFR calc non Af Amer 26 (*)    GFR calc Af Amer 30 (*)    All other components within normal limits  ETHANOL  SALICYLATE LEVEL  RAPID URINE DRUG SCREEN, HOSP PERFORMED    EKG  EKG Interpretation None       Radiology No results found.  Procedures Procedures (including critical care time)  Medications Ordered in ED Medications  0.9 %  sodium chloride infusion ( Intravenous Stopped 04/29/16 0224)  LORazepam (ATIVAN) tablet 1 mg (not administered)  ibuprofen (ADVIL,MOTRIN) tablet 600 mg (not administered)  nicotine (NICODERM CQ - dosed in mg/24 hours) patch 21 mg (not administered)  ondansetron (ZOFRAN) tablet 4 mg (not administered)  alum & mag hydroxide-simeth (MAALOX/MYLANTA) 200-200-20 MG/5ML suspension 30 mL (not administered)  amLODipine (NORVASC) tablet 10 mg (not  administered)  aspirin EC tablet 81 mg (not administered)  atorvastatin (LIPITOR) tablet 40 mg (not administered)  hydrALAZINE (APRESOLINE) tablet 100 mg (100 mg Oral Given 04/29/16 0032)  hydrochlorothiazide (HYDRODIURIL) tablet 25 mg (not administered)  potassium chloride (K-DUR) CR tablet 10 mEq (not administered)  tamsulosin (FLOMAX) capsule 0.4 mg (not administered)  traZODone (DESYREL) tablet 100 mg (not administered)     Initial Impression / Assessment and Plan / ED Course  I have reviewed the triage vital signs and the nursing notes.  Pertinent labs & imaging results that were available during my care of the patient were reviewed by me and considered in my medical decision making (see chart for details).  Clinical Course  Value Comment By Time  Hemoglobin: (!) 12.5 Mild anemia - baseline Abigail Butts, PA-C 10/19 2213  Creatinine: (!) 2.80 Pt with hx of CKD; up from 2.4 in Aug 2017 Jarrett Soho Ketzia Guzek, PA-C 10/19 2213  Barbiturates: NONE DETECTED Neg UDS Abigail Butts, PA-C 10/19 2213   Home medications ordered [facility], PA-C 10/19 2327  BP: 155/95 VSS.  Tachycardia resolved Abigail Butts, PA-C 10/19 2328   Per Stanton Kidney with TTS, pt meets inpatient criteria and they will begin searching for placement.   Jarrett Soho Levent Kornegay, PA-C 10/20 0100  Creatinine: (!) 2.58 Improvement in creatinine after fluid bolus.  Pt is medically cleared Abigail Butts, PA-C 10/20 9326    Patient presents with suicidal ideation and a plan. He is well appearing. Elevation in serum creatinine. Patient given fluid bolus with improvement. He continues to make urine without difficulty. Patient does meet inpatient criteria and psychiatry will attempt to place. Patient is medically cleared.    Final Clinical Impressions(s) / ED Diagnoses   Final diagnoses:  Suicidal ideation  Depression, unspecified depression type    New Prescriptions New Prescriptions   No medications on  file     Abigail Butts, PA-C 04/29/16 0631    Tanna Furry, MD 05/08/16 0025

## 2016-04-28 NOTE — ED Triage Notes (Signed)
Pt states he is suicidal and his plan is to stab himself.  When asked if he feels like hurting others, he states "sometimes.  It's not just one person I'd like to hurt" and does not elaborate further.

## 2016-04-28 NOTE — ED Notes (Signed)
PA at bedside.

## 2016-04-29 LAB — URINE MICROSCOPIC-ADD ON

## 2016-04-29 LAB — URINALYSIS, ROUTINE W REFLEX MICROSCOPIC
Bilirubin Urine: NEGATIVE
Glucose, UA: NEGATIVE mg/dL
Hgb urine dipstick: NEGATIVE
KETONES UR: NEGATIVE mg/dL
LEUKOCYTES UA: NEGATIVE
NITRITE: NEGATIVE
Specific Gravity, Urine: 1.013 (ref 1.005–1.030)
pH: 6.5 (ref 5.0–8.0)

## 2016-04-29 LAB — BASIC METABOLIC PANEL
Anion gap: 5 (ref 5–15)
BUN: 24 mg/dL — ABNORMAL HIGH (ref 6–20)
CHLORIDE: 111 mmol/L (ref 101–111)
CO2: 25 mmol/L (ref 22–32)
CREATININE: 2.58 mg/dL — AB (ref 0.61–1.24)
Calcium: 8.4 mg/dL — ABNORMAL LOW (ref 8.9–10.3)
GFR, EST AFRICAN AMERICAN: 30 mL/min — AB (ref >60)
GFR, EST NON AFRICAN AMERICAN: 26 mL/min — AB (ref >60)
Glucose, Bld: 94 mg/dL (ref 65–99)
POTASSIUM: 3.7 mmol/L (ref 3.5–5.1)
SODIUM: 141 mmol/L (ref 135–145)

## 2016-04-29 MED ORDER — OXYCODONE HCL 5 MG PO TABS
5.0000 mg | ORAL_TABLET | Freq: Four times a day (QID) | ORAL | Status: DC | PRN
  Administered 2016-04-29 (×2): 5 mg via ORAL
  Filled 2016-04-29 (×2): qty 1

## 2016-04-29 NOTE — Progress Notes (Signed)
Pt referred to the following facilities for inpatient treatment: [facility], MSW, [place] Work, Disposition  04/29/2016 [phone]

## 2016-04-29 NOTE — ED Notes (Signed)
Pt up to bathroom.

## 2016-04-29 NOTE — ED Notes (Signed)
TTS in process 

## 2016-04-29 NOTE — BH Assessment (Addendum)
Tele Assessment Note   [name] is an 60 y.o. male who was brought in to the [facility] voluntarily by his brother due to comments he made that he was planning to kill himself. Pt sts he planned to either shoot himself or stab himself.  Pt sts he can "get a gun if I want to." Pt sts he has been feeling "tired of everything" for about 2 days. Pt sts he has also been having thoughts of harming someone else although he refused to name the individual or tell circumstances about why he wanted to kill them. Pt sts he has been having auditory hallucinations for about 1 week telling him to "kill, kill, kill." Pt sts he interprets the command to mean to kill himself. Pt denied any visual hallucinations. Pt sts he has never actually tried to kill himself before but sts he has never felt as bad as he does today. Pt reported other periods of SI, some with a plan, and related psychiatric hospitalizations.  Pt sts he could not remember name of hospitals and dates but sts that his hospitalizations have been < 5 years ago. Pt sts he also has had OPT for a few weeks < 5 years ago, although pt sts he could not name the provider. Pt sts he is not prescribed any psychiatric medications, does not have a psychiatrist and does not see an OPT currently.   Pt sts he lives with his parents and has been with them for about 2 years. Pt sts he is divorced and has two sons ages 73 and 90 yo. Pt sts they do not live in this area. 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 Pt sts he receives disability income now and cannot work. Pt sts he has been arrested for assault multiple times > 10 years ago but, sts he has no pending charges at this time and is not on probation. Pt denies any hx of physical, emotional, verbal or sexual abuse. Pt sts he sleeps only about 2-3 hours per night and has not lost or gained significant weight in the last few months. Pt reports no barriers to completing  ADLs independently although, pt does report chronic low back pain. Symptoms of depression include sadness, fatigue, excessive guilt, decreased self esteem, tearfulness / crying spells, self isolation, lack of motivation for activities and pleasure, irritability, negative outlook, difficulty thinking & concentrating, feeling helpless and hopeless, and sleep disturbances. Pt reports past problems with anxiety and reports exccesive worry, intrusive thoughts and restlessness currently. Pt denies SA and his BAL and UDS when tested in the ED tonight were clear.    Pt was dressed in scrubs.. Pt was sleepy and sluggish. Pt was cooperative but either reluctant to give detail or having trouble remembering details. Pt kept fair eye contact, spoke in a muffled tone and at a slow pace. Pt moved in a normal manner when moving but moved restlessly throughout the assessment. Pt's thought process was coherent and relevant and judgement was impaired.  No indication of delusional thinking or response to internal stimuli. Pt's mood was stated as depressed and somewhat anxious and his blunted affect was congruent.  Pt was oriented x 4, to person, place, time and situation.  Pt was not able to contract for safety.   Diagnosis: MDD, Recurrent, Severe  Past Medical History:  Past Medical History:  Diagnosis Date  . Depression   . Hypertension   .  PE (pulmonary embolism)   . Renal disorder     Past Surgical History:  Procedure Laterality Date  . TOTAL HIP ARTHROPLASTY      Family History: No family history on file.  Social History:  reports that he has been smoking Cigarettes.  He has been smoking about 0.50 packs per day. He has never used smokeless tobacco. He reports that he does not drink alcohol or use drugs.  Additional Social History:  Alcohol / Drug Use Prescriptions: see MAR History of alcohol / drug use?: Yes Longest period of sobriety (when/how long): unknown Substance #1 Name of Substance 1:  Tobacco/Nicotine/Cigarettes 1 - Age of First Use: unknown 1 - Amount (size/oz): 1/2 pack 1 - Frequency: daily 1 - Duration: "years" 1 - Last Use / Amount: 04/28/16  CIWA: CIWA-Ar BP: 155/95 Pulse Rate: 64 COWS:    PATIENT STRENGTHS: (choose at least two) Average or above average intelligence Communication skills Supportive family/friends  Allergies:  Allergies  Allergen Reactions  . Cyclobenzaprine Nausea Only  . Clonidine Other (See Comments)    Asymptomatic bradycardia to 38  . Furosemide Other (See Comments)    Caused renal failure  . Tylenol [Acetaminophen] Other (See Comments)    Reaction:  Pt states it bothers his liver    Home Medications:  (Not in a hospital admission)  OB/GYN Status:  No LMP for male patient.  General Assessment Data Location of Assessment: [facility] ED TTS Assessment: In system Is this a Tele or Face-to-Face Assessment?: Tele Assessment Is this an Initial Assessment or a Re-assessment for this encounter?: Initial Assessment Marital status: Divorced Living Arrangements: Parent (lives w his parents) Can pt return to current living arrangement?: Yes Admission Status: Voluntary Is patient capable of signing voluntary admission?: Yes Referral Source: Self/Family/Friend Occupational hygienist) Insurance type:  Passenger transport manager)  Medical Screening Exam ([place]) Medical Exam completed: Yes  Crisis Care Plan Living Arrangements: Parent (lives w his parents) Legal Guardian:  (self) Name of Psychiatrist:  (none) Name of Therapist:  (none)  Education Status Is patient currently in school?: No Highest grade of school patient has completed:  (College degree)  Risk to self with the past 6 months Suicidal Ideation: Yes-Currently Present Has patient been a risk to self within the past 6 months prior to admission? : Yes Suicidal Intent: Yes-Currently Present Has patient had any suicidal intent within the past 6 months prior to admission? : Yes Is patient at  risk for suicide?: Yes Suicidal Plan?: Yes-Currently Present Has patient had any suicidal plan within the past 6 months prior to admission? : Yes Specify Current Suicidal Plan:  (plan to shoot himself or stab himself) Access to Means: Yes Specify Access to Suicidal Means:  (access to knife; sts he could get a gun) What has been your use of drugs/alcohol within the last 12 months?:  (none) Previous Attempts/Gestures: No How many times?:  (0) Other Self Harm Risks:  (none reported) Triggers for Past Attempts: None known Intentional Self Injurious Behavior: None Family Suicide History: Unknown Recent stressful life event(s): Loss (Comment) (Sudden death of a friend) Persecutory voices/beliefs?: No Depression: Yes Depression Symptoms: Despondent, Insomnia, Tearfulness, Isolating, Fatigue, Guilt, Loss of interest in usual pleasures, Feeling worthless/self pity, Feeling angry/irritable Substance abuse history and/or treatment for substance abuse?: No Suicide prevention information given to non-admitted patients: Not applicable  Risk to Others within the past 6 months Homicidal Ideation: Yes-Currently Present Does patient have any lifetime risk of violence toward others beyond the six months prior to  admission? : Yes (comment) (past arrests for assault) Thoughts of Harm to Others: Yes-Currently Present Comment - Thoughts of Harm to Others:  (will not give details) Current Homicidal Intent: Yes-Currently Present Current Homicidal Plan: No Access to Homicidal Means: Yes Describe Access to Homicidal Means:  (sts he "can get a gun if I want") Identified Victim:  (would not identify anyone) History of harm to others?: Yes Assessment of Violence: In distant past Violent Behavior Description:  (sts multiple arrests for assault 10+ yrs ago) Does patient have access to weapons?: Yes (Comment) (sts he can "get a gun if I want") Criminal Charges Pending?: No Does patient have a court date: No Is  patient on probation?: No  Psychosis Hallucinations: Auditory, With command (sts say "kill, kill, kill" meaning kill himself per pt) Delusions: None noted  Mental Status Report Appearance/Hygiene: Unremarkable Eye Contact: Fair Motor Activity: Freedom of movement, Restlessness Speech: Logical/coherent Level of Consciousness: Quiet/awake Mood: Depressed, Irritable Affect: Blunted, Depressed, Irritable Anxiety Level: Minimal Thought Processes: Coherent, Relevant Judgement: Impaired Orientation: Person, Place, Time, Situation Obsessive Compulsive Thoughts/Behaviors: None (none reported)  Cognitive Functioning Concentration: Poor Memory: Recent Impaired, Remote Impaired IQ: Average Insight: Poor Impulse Control: Fair Appetite: Fair Weight Loss:  (0) Weight Gain:  (0) Sleep: Decreased Total Hours of Sleep:  (2-3 hours) Vegetative Symptoms: None (none reported)  ADLScreening [facility] Assessment Services) Patient's cognitive ability adequate to safely complete daily activities?: Yes Patient able to express need for assistance with ADLs?: Yes Independently performs ADLs?: Yes (appropriate for developmental age) (no barriers reported)  Prior Inpatient Therapy Prior Inpatient Therapy: Yes Prior Therapy Dates:  (unknown- sts <5 yrs ago) Prior Therapy Facilty/Provider(s):  (cannot name facilities) Reason for Treatment:  (Depression)  Prior Outpatient Therapy Prior Outpatient Therapy: Yes Prior Therapy Dates:  (<5 years ago) Prior Therapy Facilty/Provider(s):  (cannot remember name of OPT) Reason for Treatment:  (Depression) Does patient have an ACCT team?: No Does patient have Intensive In-House Services?  : No Does patient have [facility] services? : No Does patient have P4CC services?: No  ADL Screening (condition at time of admission) Patient's cognitive ability adequate to safely complete daily activities?: Yes Patient able to express need for assistance with ADLs?:  Yes Independently performs ADLs?: Yes (appropriate for developmental age) (no barriers reported)       Abuse/Neglect Assessment (Assessment to be complete while patient is alone) Physical Abuse: Denies Verbal Abuse: Denies Sexual Abuse: Denies Exploitation of patient/patient's resources: Denies Self-Neglect: Denies     Regulatory affairs officer (For Healthcare) Does patient have an advance directive?: No Would patient like information on creating an advanced directive?: No - patient declined information    Additional Information 1:1 In Past 12 Months?: No CIRT Risk: No Elopement Risk: No Does patient have medical clearance?: Yes     Disposition:  Disposition Initial Assessment Completed for this Encounter: Yes Disposition of Patient: Other dispositions Other disposition(s): Other (Comment) (Pending review w [facility] Extender)  Per Lindon Romp, NP: Pt meet IP criteria. Recommend IP tx.  Per Inocencio Homes, AC: No appropriate beds available currently. Seek outside placement.      Spoke to Dr. Leonides Schanz, EDP at [facility] [place] of recommendation.   Faylene Kurtz, MS, CRC, [place] Triage Specialist [facility] T 04/29/2016 12:34 AM

## 2016-04-29 NOTE — ED Notes (Signed)
Pt. In hallway angry about not receiving his home medication and that he is in a lot of pain. RN notified patient that when MD returns at 11am we will discuss his home medications. Pt. Agrees with plan.

## 2016-04-29 NOTE — Progress Notes (Signed)
Accepted to [facility], [place] unit, by Dr. Launa Grill. Can arrive anytime prior to 11pm tonight (bed would be held till tomorrow if he could not be transported today per Pamala Hurry). Call report to [phone].  Sharren Bridge, MSW, LCSW Clinical Social Work, Disposition  04/29/2016 [phone]

## 2016-04-29 NOTE — ED Provider Notes (Signed)
Pt accepted at [facility] - Dr. Susa Griffins, MD 04/29/16 [phone]

## 2016-04-29 NOTE — ED Notes (Addendum)
Pt is requesting medication for his back pain.  Offered ibuprofen.  Pt declined this stating that he takes oxycodone at home and this is what he would like for pain.

## 2016-04-29 NOTE — ED Notes (Signed)
Patient was given a snack and Drink, A regular diet ordered for lunch.

## 2016-04-29 NOTE — ED Notes (Addendum)
Regular Diet was ordered for breakfast, a sprite and rice crispy treat was given until tray come.

## 2016-05-14 ENCOUNTER — Emergency Department
Admission: EM | Admit: 2016-05-14 | Discharge: 2016-05-14 | Disposition: A | Discharge status: Home / Self Care | Payer: Medicare Other | Attending: Student in an Organized Health Care Education/Training Program | Admitting: Student in an Organized Health Care Education/Training Program

## 2016-05-14 ENCOUNTER — Encounter: Payer: Self-pay | Admitting: Emergency Medicine

## 2016-05-14 DIAGNOSIS — Z79899 Other long term (current) drug therapy: Secondary | ICD-10-CM | POA: Diagnosis not present

## 2016-05-14 DIAGNOSIS — Z7982 Long term (current) use of aspirin: Secondary | ICD-10-CM | POA: Insufficient documentation

## 2016-05-14 DIAGNOSIS — F17210 Nicotine dependence, cigarettes, uncomplicated: Secondary | ICD-10-CM | POA: Diagnosis not present

## 2016-05-14 DIAGNOSIS — I129 Hypertensive chronic kidney disease with stage 1 through stage 4 chronic kidney disease, or unspecified chronic kidney disease: Secondary | ICD-10-CM | POA: Diagnosis not present

## 2016-05-14 DIAGNOSIS — N183 Chronic kidney disease, stage 3 unspecified: Secondary | ICD-10-CM

## 2016-05-14 DIAGNOSIS — R799 Abnormal finding of blood chemistry, unspecified: Secondary | ICD-10-CM | POA: Diagnosis present

## 2016-05-14 LAB — COMPREHENSIVE METABOLIC PANEL
ALBUMIN: 3.5 g/dL (ref 3.5–5.0)
ALK PHOS: 46 U/L (ref 38–126)
ALT: 17 U/L (ref 17–63)
AST: 22 U/L (ref 15–41)
Anion gap: 6 (ref 5–15)
BILIRUBIN TOTAL: 0.6 mg/dL (ref 0.3–1.2)
BUN: 22 mg/dL — AB (ref 6–20)
CALCIUM: 8.9 mg/dL (ref 8.9–10.3)
CO2: 28 mmol/L (ref 22–32)
Chloride: 106 mmol/L (ref 101–111)
Creatinine, Ser: 2.72 mg/dL — ABNORMAL HIGH (ref 0.61–1.24)
GFR calc Af Amer: 28 mL/min — ABNORMAL LOW (ref >60)
GFR calc non Af Amer: 24 mL/min — ABNORMAL LOW (ref >60)
GLUCOSE: 104 mg/dL — AB (ref 65–99)
Potassium: 3.4 mmol/L — ABNORMAL LOW (ref 3.5–5.1)
Sodium: 140 mmol/L (ref 135–145)
TOTAL PROTEIN: 7 g/dL (ref 6.5–8.1)

## 2016-05-14 MED ORDER — SODIUM CHLORIDE 0.9 % IV BOLUS (SEPSIS)
1000.0000 mL | Freq: Once | INTRAVENOUS | Status: AC
  Administered 2016-05-14: 1000 mL via INTRAVENOUS

## 2016-05-14 MED ORDER — AMLODIPINE BESYLATE 5 MG PO TABS
5.0000 mg | ORAL_TABLET | Freq: Once | ORAL | Status: AC
  Administered 2016-05-14: 5 mg via ORAL
  Filled 2016-05-14: qty 1

## 2016-05-14 NOTE — ED Notes (Signed)
Pt called by PCP office and told to come to ER to get blood work rechecked.

## 2016-05-14 NOTE — ED Triage Notes (Signed)
Pt was sent to ED by Dr due to abnormal kidney function panel on blood work done earlier today. Pt was told that his kidney function was poor and that he needed to be seen soon. Pt is in NAD at this time.

## 2016-05-14 NOTE — ED Provider Notes (Signed)
[facility] Emergency Department Provider Note    First MD Initiated Contact with Patient 05/14/16 [phone]     (approximate)  I have reviewed the triage vital signs and the nursing notes.   HISTORY  Chief Complaint Abnormal Lab    HPI [name] is a 60 y.o. male with a history of CK D followed at [facility] outpatient clinic presents due to concern for lab abnormality. He denies any chest pain or shortness of breath. He is been taking his medications as directed.. Denies any hallucinations. Denies any SI or HI. Denies any weight gain or orthopnea. States that he has had a little difficulty starting his stream and has been off of his Flomax for the past 4 days.  Past Medical History:  Diagnosis Date  . Depression   . Hypertension   . PE (pulmonary embolism)   . Renal disorder     Patient Active Problem List   Diagnosis Date Noted  . Dysthymia 02/26/2016  . Chronic pain 02/26/2016  . Noncompliance 02/26/2016  . Opiate abuse, continuous 02/03/2016  . Substance induced mood disorder ([place]) 02/03/2016  . Antisocial personality disorder 02/03/2016  . Left-sided weakness 01/18/2016  . HEPATITIS C 06/28/2008  . HYPERLIPIDEMIA 06/28/2008  . SUBSTANCE ABUSE, MULTIPLE 06/28/2008  . Hypertension 06/27/2008    Past Surgical History:  Procedure Laterality Date  . TOTAL HIP ARTHROPLASTY      Prior to Admission medications   Medication Sig Start Date End Date Taking? Authorizing Provider  amLODipine (NORVASC) 10 MG tablet Take 10 mg by mouth daily. 11/27/15   Historical Provider, MD  aspirin EC 81 MG tablet Take 81 mg by mouth daily.    Historical Provider, MD  atorvastatin (LIPITOR) 40 MG tablet Take 40 mg by mouth daily. 11/27/15   Historical Provider, MD  hydrALAZINE (APRESOLINE) 100 MG tablet Take 1 tablet (100 mg total) by mouth 3 (three) times daily. 01/18/16   Max Sane, MD  hydrochlorothiazide (HYDRODIURIL) 25 MG tablet Take 25 mg by mouth daily.  11/27/15   Historical Provider, MD  oxyCODONE (OXYCONTIN) 20 mg 12 hr tablet Take 20 mg by mouth every 12 (twelve) hours. 04/11/16   Historical Provider, MD  Oxycodone HCl 10 MG TABS Take 10-20 mg by mouth every 4 (four) hours as needed (for pain).  04/11/16   Historical Provider, MD  polyethylene glycol (MIRALAX / GLYCOLAX) packet Take 17 g by mouth daily as needed for mild constipation.  11/27/15   Historical Provider, MD  potassium chloride (K-DUR) 10 MEQ tablet Take 1 tablet (10 mEq total) by mouth daily. 02/03/16   Rudene Re, MD  tamsulosin (FLOMAX) 0.4 MG CAPS capsule Take 0.4 mg by mouth daily as needed (for urinary).  11/03/15   Historical Provider, MD  traZODone (DESYREL) 100 MG tablet Take 100 mg by mouth at bedtime as needed for sleep.  10/22/15   Historical Provider, MD    Allergies Cyclobenzaprine; Clonidine; Furosemide; and Tylenol [acetaminophen]  No family history on file.  Social History Social History  Substance Use Topics  . Smoking status: Current Every Day Smoker    Packs/day: 0.50    Types: Cigarettes  . Smokeless tobacco: Never Used  . Alcohol use No    Review of Systems Patient denies headaches, rhinorrhea, blurry vision, numbness, shortness of breath, chest pain, edema, cough, abdominal pain, nausea, vomiting, diarrhea, dysuria, fevers, rashes or hallucinations unless otherwise stated above in HPI. ____________________________________________   PHYSICAL EXAM:  VITAL SIGNS: Vitals:  05/14/16 0443  BP: (!) 182/115  Pulse: 65  Resp: 18    Constitutional: Alert and oriented. Well appearing and in no acute distress. Eyes: Conjunctivae are normal. PERRL. EOMI. Head: Atraumatic. Nose: No congestion/rhinnorhea. Mouth/Throat: Mucous membranes are moist.  Oropharynx non-erythematous. Neck: No stridor. Painless ROM. No cervical spine tenderness to palpation Hematological/Lymphatic/Immunilogical: No cervical lymphadenopathy. Cardiovascular: Normal rate,  regular rhythm. Grossly normal heart sounds.  Good peripheral circulation. Respiratory: Normal respiratory effort.  No retractions. Lungs CTAB. Gastrointestinal: Soft and nontender. No distention. No abdominal bruits. No CVA tenderness. Genitourinary:  Musculoskeletal: No lower extremity tenderness nor edema.  No joint effusions. Neurologic:  Normal speech and language. No gross focal neurologic deficits are appreciated. No gait instability. Skin:  Skin is warm, dry and intact. No rash noted. Psychiatric: Mood and affect are normal. Speech and behavior are normal.  ____________________________________________   LABS (all labs ordered are listed, but only abnormal results are displayed)  Results for orders placed or performed during the hospital encounter of 05/14/16 (from the past 24 hour(s))  Comprehensive metabolic panel     Status: Abnormal   Collection Time: 05/14/16  6:05 AM  Result Value Ref Range   Sodium 140 135 - 145 mmol/L   Potassium 3.4 (L) 3.5 - 5.1 mmol/L   Chloride 106 101 - 111 mmol/L   CO2 28 22 - 32 mmol/L   Glucose, Bld 104 (H) 65 - 99 mg/dL   BUN 22 (H) 6 - 20 mg/dL   Creatinine, Ser 2.72 (H) 0.61 - 1.24 mg/dL   Calcium 8.9 8.9 - 10.3 mg/dL   Total Protein 7.0 6.5 - 8.1 g/dL   Albumin 3.5 3.5 - 5.0 g/dL   AST 22 15 - 41 U/L   ALT 17 17 - 63 U/L   Alkaline Phosphatase 46 38 - 126 U/L   Total Bilirubin 0.6 0.3 - 1.2 mg/dL   GFR calc non Af Amer 24 (L) >60 mL/min   GFR calc Af Amer 28 (L) >60 mL/min   Anion gap 6 5 - 15   ____________________________________________  EKG____________________________________________  RADIOLOGY   ____________________________________________   PROCEDURES  Procedure(s) performed: none    Critical Care performed: no ____________________________________________   INITIAL IMPRESSION / ASSESSMENT AND PLAN / ED COURSE  Pertinent labs & imaging results that were available during my care of the patient were reviewed by  me and considered in my medical decision making (see chart for details).  DDX: electrolyte abn, htn, aki, dehydration, post obstructive nephrolpathy  [name] is a 60 y.o. who presents to the ED with concern for abnormal labs.Patient is AFVSS in ED. Exam as above. Given current presentation have considered the above differential.  Patient well appearing and in no acute distress. Labs were rechecked and show chronic kidney disease. Likely multifactorial given his medical history. Reviewing his care everywhere chart it does look like the patient is roughly at baseline. He has no acidosis or hyperkalemia. On review of notes from his PCP it appears that he was being recommended to follow-up with nephrology.  I do suspect that this is a miscommunication and that they had not told him to come to the ER. Based on his labs and have given him a liter fluid. We'll start him on Flomax again. Do not feel further diagnostic testing clinically indicated at this time.     ____________________________________________   FINAL CLINICAL IMPRESSION(S) / ED DIAGNOSES  Final diagnoses:  Stage 3 chronic kidney disease  NEW MEDICATIONS STARTED DURING THIS VISIT:  New Prescriptions   No medications on file     Note:  This document was prepared using Dragon voice recognition software and may include unintentional dictation errors.    Merlyn Lot, MD 05/14/16 [phone]

## 2016-05-29 ENCOUNTER — Emergency Department
Admission: EM | Admit: 2016-05-29 | Discharge: 2016-05-29 | Disposition: A | Discharge status: Home / Self Care | Payer: Medicare Other | Attending: Emergency Medicine | Admitting: Emergency Medicine

## 2016-05-29 ENCOUNTER — Emergency Department: Payer: Medicare Other

## 2016-05-29 ENCOUNTER — Encounter: Payer: Self-pay | Admitting: Emergency Medicine

## 2016-05-29 DIAGNOSIS — K219 Gastro-esophageal reflux disease without esophagitis: Secondary | ICD-10-CM | POA: Insufficient documentation

## 2016-05-29 DIAGNOSIS — R0789 Other chest pain: Secondary | ICD-10-CM

## 2016-05-29 DIAGNOSIS — F17210 Nicotine dependence, cigarettes, uncomplicated: Secondary | ICD-10-CM | POA: Insufficient documentation

## 2016-05-29 DIAGNOSIS — R0602 Shortness of breath: Secondary | ICD-10-CM | POA: Insufficient documentation

## 2016-05-29 DIAGNOSIS — Z7982 Long term (current) use of aspirin: Secondary | ICD-10-CM | POA: Diagnosis not present

## 2016-05-29 DIAGNOSIS — Z79899 Other long term (current) drug therapy: Secondary | ICD-10-CM | POA: Diagnosis not present

## 2016-05-29 DIAGNOSIS — I10 Essential (primary) hypertension: Secondary | ICD-10-CM | POA: Insufficient documentation

## 2016-05-29 DIAGNOSIS — R079 Chest pain, unspecified: Secondary | ICD-10-CM | POA: Diagnosis present

## 2016-05-29 HISTORY — DX: Acute myocardial infarction, unspecified: I21.9

## 2016-05-29 LAB — BASIC METABOLIC PANEL
ANION GAP: 10 (ref 5–15)
BUN: 35 mg/dL — ABNORMAL HIGH (ref 6–20)
CHLORIDE: 100 mmol/L — AB (ref 101–111)
CO2: 29 mmol/L (ref 22–32)
Calcium: 8.3 mg/dL — ABNORMAL LOW (ref 8.9–10.3)
Creatinine, Ser: 3.18 mg/dL — ABNORMAL HIGH (ref 0.61–1.24)
GFR, EST AFRICAN AMERICAN: 23 mL/min — AB (ref >60)
GFR, EST NON AFRICAN AMERICAN: 20 mL/min — AB (ref >60)
Glucose, Bld: 127 mg/dL — ABNORMAL HIGH (ref 65–99)
POTASSIUM: 2.9 mmol/L — AB (ref 3.5–5.1)
SODIUM: 139 mmol/L (ref 135–145)

## 2016-05-29 LAB — ETHANOL: Alcohol, Ethyl (B): <5 mg/dL (ref <5)

## 2016-05-29 LAB — CBC
HEMATOCRIT: 37.3 % — AB (ref 40.0–52.0)
Hemoglobin: 13 g/dL (ref 13.0–18.0)
MCH: 28.9 pg (ref 26.0–34.0)
MCHC: 34.8 g/dL (ref 32.0–36.0)
MCV: 82.8 fL (ref 80.0–100.0)
Platelets: 182 10*3/uL (ref 150–440)
RBC: 4.5 MIL/uL (ref 4.40–5.90)
RDW: 14.9 % — AB (ref 11.5–14.5)
WBC: 9.3 10*3/uL (ref 3.8–10.6)

## 2016-05-29 LAB — BRAIN NATRIURETIC PEPTIDE: B NATRIURETIC PEPTIDE 5: 70 pg/mL (ref 0.0–100.0)

## 2016-05-29 LAB — TROPONIN I: Troponin I: 0.03 ng/mL (ref <0.03)

## 2016-05-29 MED ORDER — GI COCKTAIL ~~LOC~~
30.0000 mL | ORAL | Status: AC
  Administered 2016-05-29: 30 mL via ORAL
  Filled 2016-05-29: qty 30

## 2016-05-29 MED ORDER — SUCRALFATE 1 G PO TABS: 1.0000 g | ORAL_TABLET | Freq: Four times a day (QID) | ORAL | 1 refills | Status: DC

## 2016-05-29 MED ORDER — FAMOTIDINE 20 MG PO TABS
40.0000 mg | ORAL_TABLET | Freq: Once | ORAL | Status: AC
  Administered 2016-05-29: 40 mg via ORAL
  Filled 2016-05-29: qty 2

## 2016-05-29 MED ORDER — FAMOTIDINE 20 MG PO TABS: 20.0000 mg | ORAL_TABLET | Freq: Two times a day (BID) | ORAL | 0 refills | Status: DC

## 2016-05-29 NOTE — ED Notes (Signed)
Discharge blood pressure 131/99, dr. Joni Fears notified and orders discharge with this knowledge.

## 2016-05-29 NOTE — ED Notes (Signed)
Pt requesting additional pain medication. md notified. No new orders received.

## 2016-05-29 NOTE — ED Notes (Signed)
Answered pt's callbell; given warm blankets for comfort and thermostat in room adjusted for more warmth; side rails up x 2 with callbell in reach

## 2016-05-29 NOTE — ED Notes (Signed)
Troponin of 0.03 called from lab. Dr. Joni Fears notified. Pt placed on cardiac monitor, nsr noted. Warm blankets and urinal provided. Call bell at side. Pt does not know what medications he takes at home and does not know what type of medical problems he has. Pt states he did have a heart attack 5 years ago but is not sure if he has a history of htn, heart failure, diabetes, kidney failure.

## 2016-05-29 NOTE — ED Triage Notes (Signed)
States chest pain today, states fluid pills not working and states increased abd fluid

## 2016-05-29 NOTE — ED Notes (Signed)
Awoke pt from sleep to assess pain level after administration of gi cocktail. Pt reports no improvement in pain and is asking for "that d stuff or maybe the percocets." md informed.

## 2016-05-29 NOTE — ED Provider Notes (Signed)
[facility] Emergency Department Provider Note  ____________________________________________  Time seen: Approximately 10:00 PM  I have reviewed the triage vital signs and the nursing notes.   HISTORY  Chief Complaint Chest Pain    HPI [name] is a 60 y.o. male who complains of central chest pain that is sharp, hurting for the past 2 days continuously. No shortness of breath vomiting or diaphoresis. Worse with turning and rolling over and changing position. Not exertional, not pleuritic. No cough. Feels it in the upper abdomen radiating up into the throat. Hurts to swallow.  Patient requests Dilaudid and other opioids by name   Past Medical History:  Diagnosis Date  . Depression   . Heart attack   . Hypertension   . PE (pulmonary embolism)   . Renal disorder      Patient Active Problem List   Diagnosis Date Noted  . Dysthymia 02/26/2016  . Chronic pain 02/26/2016  . Noncompliance 02/26/2016  . Opiate abuse, continuous 02/03/2016  . Substance induced mood disorder ([place]) 02/03/2016  . Antisocial personality disorder 02/03/2016  . Left-sided weakness 01/18/2016  . HEPATITIS C 06/28/2008  . HYPERLIPIDEMIA 06/28/2008  . SUBSTANCE ABUSE, MULTIPLE 06/28/2008  . Hypertension 06/27/2008     Past Surgical History:  Procedure Laterality Date  . TOTAL HIP ARTHROPLASTY       Prior to Admission medications   Medication Sig Start Date End Date Taking? Authorizing Provider  amLODipine (NORVASC) 10 MG tablet Take 10 mg by mouth daily. 11/27/15   Historical Provider, MD  aspirin EC 81 MG tablet Take 81 mg by mouth daily.    Historical Provider, MD  atorvastatin (LIPITOR) 40 MG tablet Take 40 mg by mouth daily. 11/27/15   Historical Provider, MD  famotidine (PEPCID) 20 MG tablet Take 1 tablet (20 mg total) by mouth 2 (two) times daily. 05/29/16   Carrie Mew, MD  hydrALAZINE (APRESOLINE) 100 MG tablet Take 1 tablet (100 mg total) by mouth 3  (three) times daily. 01/18/16   Max Sane, MD  hydrochlorothiazide (HYDRODIURIL) 25 MG tablet Take 25 mg by mouth daily. 11/27/15   Historical Provider, MD  oxyCODONE (OXYCONTIN) 20 mg 12 hr tablet Take 20 mg by mouth every 12 (twelve) hours. 04/11/16   Historical Provider, MD  Oxycodone HCl 10 MG TABS Take 10-20 mg by mouth every 4 (four) hours as needed (for pain).  04/11/16   Historical Provider, MD  polyethylene glycol (MIRALAX / GLYCOLAX) packet Take 17 g by mouth daily as needed for mild constipation.  11/27/15   Historical Provider, MD  potassium chloride (K-DUR) 10 MEQ tablet Take 1 tablet (10 mEq total) by mouth daily. 02/03/16   Rudene Re, MD  sucralfate (CARAFATE) 1 g tablet Take 1 tablet (1 g total) by mouth 4 (four) times daily. 05/29/16   Carrie Mew, MD  tamsulosin (FLOMAX) 0.4 MG CAPS capsule Take 0.4 mg by mouth daily as needed (for urinary).  11/03/15   Historical Provider, MD  traZODone (DESYREL) 100 MG tablet Take 100 mg by mouth at bedtime as needed for sleep.  10/22/15   Historical Provider, MD     Allergies Cyclobenzaprine; Clonidine; Furosemide; and Tylenol [acetaminophen]   History reviewed. No pertinent family history.  Social History Social History  Substance Use Topics  . Smoking status: Current Every Day Smoker    Packs/day: 0.50    Types: Cigarettes  . Smokeless tobacco: Never Used  . Alcohol use No    Review of Systems  Constitutional:   No fever or chills.  ENT:   No sore throat. No rhinorrhea. Cardiovascular:   Positive as above chest pain. Respiratory:   No dyspnea or cough. Gastrointestinal:   Negative for abdominal pain, vomiting and diarrhea.   Musculoskeletal:   Negative for focal pain or swelling  10-point ROS otherwise negative.  ____________________________________________   PHYSICAL EXAM:  VITAL SIGNS: ED Triage Vitals  Enc Vitals Group     BP 05/29/16 1839 (!) 134/101     Pulse Rate 05/29/16 1839 88     Resp 05/29/16  1839 18     Temp 05/29/16 1839 97.8 F (36.6 C)     Temp src --      SpO2 05/29/16 1839 95 %     Weight 05/29/16 1836 260 lb (117.9 kg)     Height 05/29/16 1836 6' (1.829 m)     Head Circumference --      Peak Flow --      Pain Score 05/29/16 1836 10     Pain Loc --      Pain Edu? --      Excl. in [place]? --     Vital signs reviewed, nursing assessments reviewed.   Constitutional:   Alert and oriented. Well appearing and in no distress. Eyes:   No scleral icterus. No conjunctival pallor. PERRL. EOMI.  No nystagmus. ENT   Head:   Normocephalic and atraumatic.   Nose:   No congestion/rhinnorhea. No septal hematoma   Mouth/Throat:   MMM, no pharyngeal erythema. No peritonsillar mass.    Neck:   No stridor. No SubQ emphysema. No meningismus. Hematological/Lymphatic/Immunilogical:   No cervical lymphadenopathy. Cardiovascular:   RRR. Symmetric bilateral radial and DP pulses.  No murmurs.  Respiratory:   Normal respiratory effort without tachypnea nor retractions. Breath sounds are clear and equal bilaterally. No wheezes/rales/rhonchi. Anterior chest wall tender which reproduces his pain Gastrointestinal:   Soft and nontender. Non distended. There is no CVA tenderness.  No rebound, rigidity, or guarding. Genitourinary:   deferred Musculoskeletal:   Nontender with normal range of motion in all extremities. No joint effusions.  No lower extremity tenderness.  No edema. Neurologic:   Normal speech and language.  CN 2-10 normal. Motor grossly intact. No gross focal neurologic deficits are appreciated.  Skin:    Skin is warm, dry and intact. No rash noted.  No petechiae, purpura, or bullae.  ____________________________________________    LABS (pertinent positives/negatives) (all labs ordered are listed, but only abnormal results are displayed) Labs Reviewed  BASIC METABOLIC PANEL - Abnormal; Notable for the following:       Result Value   Potassium 2.9 (*)    Chloride 100  (*)    Glucose, Bld 127 (*)    BUN 35 (*)    Creatinine, Ser 3.18 (*)    Calcium 8.3 (*)    GFR calc non Af Amer 20 (*)    GFR calc Af Amer 23 (*)    All other components within normal limits  CBC - Abnormal; Notable for the following:    HCT 37.3 (*)    RDW 14.9 (*)    All other components within normal limits  TROPONIN I - Abnormal; Notable for the following:    Troponin I 0.03 (*)    All other components within normal limits  BRAIN NATRIURETIC PEPTIDE  ETHANOL  URINALYSIS COMPLETEWITH MICROSCOPIC ([facility] ONLY)   ____________________________________________   EKG  Interpreted by me Sinus rhythm rate of 91,  left axis, normal intervals. Normal QRS ST segments and T waves.  ____________________________________________    RADIOLOGY  Chest x-ray unremarkable  ____________________________________________   PROCEDURES Procedures  ____________________________________________   INITIAL IMPRESSION / ASSESSMENT AND PLAN / ED COURSE  Pertinent labs & imaging results that were available during my care of the patient were reviewed by me and considered in my medical decision making (see chart for details).  Patient well appearing no acute distress. Presents with atypical chest pain.Considering the patient's symptoms, medical history, and physical examination today, I have low suspicion for ACS, PE, TAD, pneumothorax, carditis, mediastinitis, pneumonia, CHF, or sepsis.  Symptoms highly consistent with GERD. Possibly chest wall pain as well with the exam findings. Workup negative. We'll discharge home to follow up with primary care. Start on Carafate and H2 blocker. Defer further opioids to pain management follow-up to which he has started been referred     Clinical Course    ____________________________________________   FINAL CLINICAL IMPRESSION(S) / ED DIAGNOSES  Final diagnoses:  Atypical chest pain  Gastroesophageal reflux disease, esophagitis presence not  specified       Portions of this note were generated with dragon dictation software. Dictation errors may occur despite best attempts at proofreading.    Carrie Mew, MD 05/29/16 2209

## 2016-05-29 NOTE — ED Notes (Signed)
md in to discuss results and pain management with pt.

## 2016-06-23 ENCOUNTER — Emergency Department: Payer: Medicare Other

## 2016-06-23 ENCOUNTER — Emergency Department
Admission: EM | Admit: 2016-06-23 | Discharge: 2016-06-23 | Disposition: A | Discharge status: Home / Self Care | Payer: Medicare Other | Attending: Emergency Medicine | Admitting: Emergency Medicine

## 2016-06-23 ENCOUNTER — Encounter: Payer: Self-pay | Admitting: Emergency Medicine

## 2016-06-23 DIAGNOSIS — F17210 Nicotine dependence, cigarettes, uncomplicated: Secondary | ICD-10-CM | POA: Diagnosis not present

## 2016-06-23 DIAGNOSIS — K921 Melena: Secondary | ICD-10-CM | POA: Diagnosis not present

## 2016-06-23 DIAGNOSIS — R109 Unspecified abdominal pain: Secondary | ICD-10-CM | POA: Diagnosis present

## 2016-06-23 DIAGNOSIS — R195 Other fecal abnormalities: Secondary | ICD-10-CM

## 2016-06-23 DIAGNOSIS — Z79899 Other long term (current) drug therapy: Secondary | ICD-10-CM | POA: Insufficient documentation

## 2016-06-23 DIAGNOSIS — I10 Essential (primary) hypertension: Secondary | ICD-10-CM | POA: Insufficient documentation

## 2016-06-23 DIAGNOSIS — R1032 Left lower quadrant pain: Secondary | ICD-10-CM

## 2016-06-23 DIAGNOSIS — Z7982 Long term (current) use of aspirin: Secondary | ICD-10-CM | POA: Insufficient documentation

## 2016-06-23 LAB — TYPE AND SCREEN
ABO/RH(D): O POS
Antibody Screen: NEGATIVE

## 2016-06-23 LAB — COMPREHENSIVE METABOLIC PANEL
ALK PHOS: 49 U/L (ref 38–126)
ALT: 16 U/L — ABNORMAL LOW (ref 17–63)
ANION GAP: 5 (ref 5–15)
AST: 18 U/L (ref 15–41)
Albumin: 3.5 g/dL (ref 3.5–5.0)
BILIRUBIN TOTAL: 0.5 mg/dL (ref 0.3–1.2)
BUN: 30 mg/dL — ABNORMAL HIGH (ref 6–20)
CALCIUM: 9.3 mg/dL (ref 8.9–10.3)
CO2: 29 mmol/L (ref 22–32)
Chloride: 107 mmol/L (ref 101–111)
Creatinine, Ser: 3.06 mg/dL — ABNORMAL HIGH (ref 0.61–1.24)
GFR, EST AFRICAN AMERICAN: 24 mL/min — AB (ref >60)
GFR, EST NON AFRICAN AMERICAN: 21 mL/min — AB (ref >60)
GLUCOSE: 91 mg/dL (ref 65–99)
POTASSIUM: 4.6 mmol/L (ref 3.5–5.1)
Sodium: 141 mmol/L (ref 135–145)
TOTAL PROTEIN: 6.8 g/dL (ref 6.5–8.1)

## 2016-06-23 LAB — CBC
HEMATOCRIT: 37.9 % — AB (ref 40.0–52.0)
HEMOGLOBIN: 13 g/dL (ref 13.0–18.0)
MCH: 29.1 pg (ref 26.0–34.0)
MCHC: 34.4 g/dL (ref 32.0–36.0)
MCV: 84.5 fL (ref 80.0–100.0)
Platelets: 185 10*3/uL (ref 150–440)
RBC: 4.48 MIL/uL (ref 4.40–5.90)
RDW: 14.8 % — ABNORMAL HIGH (ref 11.5–14.5)
WBC: 8.7 10*3/uL (ref 3.8–10.6)

## 2016-06-23 LAB — URINALYSIS, COMPLETE (UACMP) WITH MICROSCOPIC
BILIRUBIN URINE: NEGATIVE
Bacteria, UA: NONE SEEN
Glucose, UA: NEGATIVE mg/dL
HGB URINE DIPSTICK: NEGATIVE
KETONES UR: NEGATIVE mg/dL
LEUKOCYTES UA: NEGATIVE
NITRITE: NEGATIVE
PH: 8 (ref 5.0–8.0)
Protein, ur: >=300 mg/dL — AB
SPECIFIC GRAVITY, URINE: 1.009 (ref 1.005–1.030)

## 2016-06-23 LAB — LIPASE, BLOOD: Lipase: 31 U/L (ref 11–51)

## 2016-06-23 MED ORDER — IOPAMIDOL (ISOVUE-300) INJECTION 61%
30.0000 mL | Freq: Once | INTRAVENOUS | Status: AC
  Administered 2016-06-23: 30 mL via ORAL
  Filled 2016-06-23: qty 30

## 2016-06-23 MED ORDER — FENTANYL CITRATE (PF) 100 MCG/2ML IJ SOLN
75.0000 ug | Freq: Once | INTRAMUSCULAR | Status: AC
  Administered 2016-06-23: 75 ug via INTRAMUSCULAR
  Filled 2016-06-23: qty 2
  Filled 2016-06-23: qty 1.5

## 2016-06-23 MED ORDER — FENTANYL CITRATE (PF) 100 MCG/2ML IJ SOLN: 50.0000 ug | Freq: Once | INTRAMUSCULAR | Status: DC

## 2016-06-23 NOTE — ED Notes (Signed)
Patient asked for phone to call ride after speaking with MD. This RN stepped into another room while patient used phone and once I came out of another room the patient had left without D/C paper. Unable to obtain e signature

## 2016-06-23 NOTE — ED Provider Notes (Signed)
[facility] Emergency Department Provider Note  ____________________________________________  Time seen: Approximately 3:14 PM  I have reviewed the triage vital signs and the nursing notes.   HISTORY  Chief Complaint Rectal Bleeding and Abdominal Pain    HPI [name] is a 60 y.o. male with a history of diverticulitis, not anticoagulated, presenting with blood-streaked stool and left-sided abdominal discomfort. The patient reports that yesterday he had some mild pain on the left side and noticed a streak on his stool. Today, he had more blood, with a slight tinge to the toilet water. He has not had any fever, chills, nausea or vomiting, constipation or diarrhea. The patient does not have a history of hemorrhoids and has not had pain with defecation.   Past Medical History:  Diagnosis Date  . Depression   . Heart attack   . Hypertension   . PE (pulmonary embolism)   . Renal disorder     Patient Active Problem List   Diagnosis Date Noted  . Dysthymia 02/26/2016  . Chronic pain 02/26/2016  . Noncompliance 02/26/2016  . Opiate abuse, continuous 02/03/2016  . Substance induced mood disorder ([place]) 02/03/2016  . Antisocial personality disorder 02/03/2016  . Left-sided weakness 01/18/2016  . HEPATITIS C 06/28/2008  . HYPERLIPIDEMIA 06/28/2008  . SUBSTANCE ABUSE, MULTIPLE 06/28/2008  . Hypertension 06/27/2008    Past Surgical History:  Procedure Laterality Date  . TOTAL HIP ARTHROPLASTY      Current Outpatient Rx  . Order #: [id number] Class: Historical Med  . Order #: [id number] Class: Historical Med  . Order #: [id number] Class: Historical Med  . Order #: [id number] Class: Print  . Order #: [id number] Class: Normal  . Order #: [id number] Class: Historical Med  . Order #: [id number] Class: Historical Med  . Order #: [id number] Class: Historical Med  . Order #: [id number] Class: Historical Med  . Order #: [id number] Class: Print  . Order #:  [id number] Class: Print  . Order #: [id number] Class: Historical Med  . Order #: [id number] Class: Historical Med    Allergies Cyclobenzaprine; Clonidine; Furosemide; and Tylenol [acetaminophen]  History reviewed. No pertinent family history.  Social History Social History  Substance Use Topics  . Smoking status: Current Every Day Smoker    Packs/day: 0.50    Types: Cigarettes  . Smokeless tobacco: Never Used  . Alcohol use No    Review of Systems Constitutional: No fever/chills.No lightheadedness or syncope. Eyes: No visual changes. ENT: No sore throat. No congestion or rhinorrhea. Cardiovascular: Denies chest pain. Denies palpitations. Respiratory: Denies shortness of breath.  No cough. Gastrointestinal: Positive left-sided abdominal pain.  No nausea, no vomiting.  No diarrhea.  No constipation. Positive blood-streaked stool. Genitourinary: Negative for dysuria. Musculoskeletal: Negative for back pain. Skin: Negative for rash. Neurological: Negative for headaches. No focal numbness, tingling or weakness.   10-point ROS otherwise negative.  ____________________________________________   PHYSICAL EXAM:  VITAL SIGNS: ED Triage Vitals  Enc Vitals Group     BP 06/23/16 1319 (!) 144/96     Pulse Rate 06/23/16 1319 69     Resp 06/23/16 1319 18     Temp 06/23/16 1319 98.4 F (36.9 C)     Temp Source 06/23/16 1319 Oral     SpO2 06/23/16 1319 95 %     Weight 06/23/16 1319 270 lb (122.5 kg)     Height 06/23/16 1319 6' (1.829 m)     Head Circumference --      Peak Flow --      Pain Score 06/23/16 1322 8  Pain Loc --      Pain Edu? --      Excl. in [place]? --     Constitutional: Alert and oriented. [place] ill-appearing but in no acute distress. Answers questions appropriately. Eyes: Conjunctivae are normal without pallor.  EOMI. No scleral icterus. Head: Atraumatic. Nose: No congestion/rhinnorhea. Mouth/Throat: Mucous membranes are moist.  Neck: No stridor.  Supple.    Cardiovascular: Normal rate, regular rhythm. No murmurs, rubs or gallops.  Respiratory: Normal respiratory effort.  No accessory muscle use or retractions. Lungs CTAB.  No wheezes, rales or ronchi. Gastrointestinal: Morbidly obese Soft, and nondistended.  Mild tenderness to palpation in the left lower quadrant greater than left upper quadrant. No guarding or rebound.  No peritoneal signs. Genitourinary: No evidence of external hemorrhoids are palpable internal hemorrhoids. The patient has loose stool that is orange in color and guaiac-negative. There is no evidence of acute bleeding. Musculoskeletal: No LE edema.  Neurologic:  A&Ox3.  Speech is clear.  Face and smile are symmetric.  EOMI.  Moves all extremities well. Skin:  Skin is warm, dry and intact. No rash noted. No pallor Psychiatric: Mood and affect are normal. Speech and behavior are normal.  Normal judgement.  ____________________________________________   LABS (all labs ordered are listed, but only abnormal results are displayed)  Labs Reviewed  COMPREHENSIVE METABOLIC PANEL - Abnormal; Notable for the following:       Result Value   BUN 30 (*)    Creatinine, Ser 3.06 (*)    ALT 16 (*)    GFR calc non Af Amer 21 (*)    GFR calc Af Amer 24 (*)    All other components within normal limits  CBC - Abnormal; Notable for the following:    HCT 37.9 (*)    RDW 14.8 (*)    All other components within normal limits  URINALYSIS, COMPLETE (UACMP) WITH MICROSCOPIC - Abnormal; Notable for the following:    Color, Urine YELLOW (*)    APPearance CLEAR (*)    Protein, ur >=300 (*)    Squamous Epithelial / LPF 0-5 (*)    All other components within normal limits  LIPASE, BLOOD  POC OCCULT BLOOD, ED  TYPE AND SCREEN   ____________________________________________  EKG  Not indicated ____________________________________________  RADIOLOGY  Ct Abdomen Pelvis Wo Contrast  Result Date: 06/23/2016 CLINICAL DATA:  Left lower  quadrant pain since yesterday. Blood in stool. EXAM: CT ABDOMEN AND PELVIS WITHOUT CONTRAST TECHNIQUE: Multidetector CT imaging of the abdomen and pelvis was performed following the standard protocol without IV contrast. COMPARISON:  CT abdomen and pelvis 10/19/2014. FINDINGS: Lower chest: Lung bases are clear. No pleural or pericardial effusion. Hepatobiliary: No focal liver abnormality is seen. No gallstones, gallbladder wall thickening, or biliary dilatation. Pancreas: Unremarkable. No pancreatic ductal dilatation or surrounding inflammatory changes. Spleen: A few small calcifications are identified in the spleen, unchanged. Adrenals/Urinary Tract: Cyst in lower pole of the right kidney is noted. The kidneys are otherwise unremarkable. Ureters and urinary bladder appear normal. Mild thickening of the left adrenal gland is compatible with hyperplasia. The right adrenal gland is unremarkable. Stomach/Bowel: Scattered diverticula are seen. No diverticulitis. The stomach, small bowel and appendix appear normal. Vascular/Lymphatic: Scattered aortoiliac atherosclerotic calcifications without aneurysm. No lymphadenopathy Reproductive: Negative. Other: Fat containing supraumbilical hernia is unchanged. No fluid collection. Musculoskeletal: The patient is status post left hip replacement. There is some degenerative change about the right hip. Degenerative disc disease is seen at L5-S1. No lytic or  sclerotic lesion. IMPRESSION: No acute abnormality or finding to explain the patient's symptoms. Diverticulosis without diverticulitis. Atherosclerosis. Fat containing supraumbilical hernia. Electronically Signed   By: Inge Rise M.D.   On: 06/23/2016 17:35    ____________________________________________   PROCEDURES  Procedure(s) performed: None  Procedures  Critical Care performed: No ____________________________________________   INITIAL IMPRESSION / ASSESSMENT AND PLAN / ED COURSE  Pertinent labs &  imaging results that were available during my care of the patient were reviewed by me and considered in my medical decision making (see chart for details).  60 y.o. male with a history of diverticulitis presenting with left lower quadrant pain and blood-streaked stool. On my examination, the patient has reassuring vital signs, a stable hematocrit and hemoglobin, and no evidence of acute bleeding. He does have orange stool and I have queried him about unusual food intake or new medications, which she is denied. He does have some discomfort in the left lower quadrant so we'll get a CT scan to evaluate for diverticulitis. I'll continue to monitor the patient but if his CT scan is reassuring we'll plan discharge home and close PMD follow-up.  ____________________________________________  FINAL CLINICAL IMPRESSION(S) / ED DIAGNOSES  Final diagnoses:  Left sided abdominal pain of unknown cause  Abnormal stool color    Clinical Course       NEW MEDICATIONS STARTED DURING THIS VISIT:  Discharge Medication List as of 06/23/2016  5:42 PM        Eula Listen, MD 06/23/16 1949

## 2016-06-23 NOTE — Discharge Instructions (Signed)
Please drink plenty of fluid to stay well-hydrated. Please make a follow-up appointment with your primary care physician.  Return to the emergency department if you develop severe pain, nausea or vomiting, shortness of breath, fever, or any other symptoms concerning to you.

## 2016-06-23 NOTE — ED Triage Notes (Signed)
Pt states yesterday began noticing bright red blood on his stools. Pt Pt denies bleeding into his underwear, states that he only sees blood after a bowel movement. Pt denies constipation, states this problem has happened in the past when he had "an intestine infection". Pt is unable to state what kind of "intestine infection" he had. Pt is alert and oriented.

## 2016-09-21 ENCOUNTER — Inpatient Hospital Stay
Admission: EM | Admit: 2016-09-21 | Discharge: 2016-09-27 | DRG: 304 | Disposition: A | Discharge status: Home / Self Care | Payer: Medicare Other | Attending: Internal Medicine | Admitting: Internal Medicine

## 2016-09-21 ENCOUNTER — Emergency Department: Payer: Medicare Other

## 2016-09-21 ENCOUNTER — Encounter: Payer: Self-pay | Admitting: Emergency Medicine

## 2016-09-21 DIAGNOSIS — Z96649 Presence of unspecified artificial hip joint: Secondary | ICD-10-CM | POA: Diagnosis present

## 2016-09-21 DIAGNOSIS — N184 Chronic kidney disease, stage 4 (severe): Secondary | ICD-10-CM | POA: Diagnosis present

## 2016-09-21 DIAGNOSIS — N179 Acute kidney failure, unspecified: Secondary | ICD-10-CM | POA: Diagnosis present

## 2016-09-21 DIAGNOSIS — Z886 Allergy status to analgesic agent status: Secondary | ICD-10-CM

## 2016-09-21 DIAGNOSIS — Z9114 Patient's other noncompliance with medication regimen: Secondary | ICD-10-CM

## 2016-09-21 DIAGNOSIS — N170 Acute kidney failure with tubular necrosis: Secondary | ICD-10-CM | POA: Diagnosis present

## 2016-09-21 DIAGNOSIS — Z888 Allergy status to other drugs, medicaments and biological substances status: Secondary | ICD-10-CM

## 2016-09-21 DIAGNOSIS — Z7902 Long term (current) use of antithrombotics/antiplatelets: Secondary | ICD-10-CM

## 2016-09-21 DIAGNOSIS — N400 Enlarged prostate without lower urinary tract symptoms: Secondary | ICD-10-CM | POA: Diagnosis present

## 2016-09-21 DIAGNOSIS — I252 Old myocardial infarction: Secondary | ICD-10-CM

## 2016-09-21 DIAGNOSIS — F329 Major depressive disorder, single episode, unspecified: Secondary | ICD-10-CM | POA: Diagnosis present

## 2016-09-21 DIAGNOSIS — Z7982 Long term (current) use of aspirin: Secondary | ICD-10-CM

## 2016-09-21 DIAGNOSIS — Z79899 Other long term (current) drug therapy: Secondary | ICD-10-CM

## 2016-09-21 DIAGNOSIS — D631 Anemia in chronic kidney disease: Secondary | ICD-10-CM | POA: Diagnosis present

## 2016-09-21 DIAGNOSIS — N189 Chronic kidney disease, unspecified: Secondary | ICD-10-CM

## 2016-09-21 DIAGNOSIS — I161 Hypertensive emergency: Principal | ICD-10-CM | POA: Diagnosis present

## 2016-09-21 DIAGNOSIS — N281 Cyst of kidney, acquired: Secondary | ICD-10-CM | POA: Diagnosis present

## 2016-09-21 DIAGNOSIS — R079 Chest pain, unspecified: Secondary | ICD-10-CM | POA: Diagnosis not present

## 2016-09-21 DIAGNOSIS — I160 Hypertensive urgency: Secondary | ICD-10-CM | POA: Diagnosis present

## 2016-09-21 DIAGNOSIS — I1310 Hypertensive heart and chronic kidney disease without heart failure, with stage 1 through stage 4 chronic kidney disease, or unspecified chronic kidney disease: Secondary | ICD-10-CM | POA: Diagnosis present

## 2016-09-21 DIAGNOSIS — F17210 Nicotine dependence, cigarettes, uncomplicated: Secondary | ICD-10-CM | POA: Diagnosis present

## 2016-09-21 DIAGNOSIS — K5900 Constipation, unspecified: Secondary | ICD-10-CM | POA: Diagnosis present

## 2016-09-21 DIAGNOSIS — R809 Proteinuria, unspecified: Secondary | ICD-10-CM | POA: Diagnosis present

## 2016-09-21 DIAGNOSIS — N2889 Other specified disorders of kidney and ureter: Secondary | ICD-10-CM | POA: Diagnosis present

## 2016-09-21 DIAGNOSIS — Z79891 Long term (current) use of opiate analgesic: Secondary | ICD-10-CM

## 2016-09-21 DIAGNOSIS — I2510 Atherosclerotic heart disease of native coronary artery without angina pectoris: Secondary | ICD-10-CM | POA: Diagnosis present

## 2016-09-21 DIAGNOSIS — Z86711 Personal history of pulmonary embolism: Secondary | ICD-10-CM

## 2016-09-21 DIAGNOSIS — G894 Chronic pain syndrome: Secondary | ICD-10-CM | POA: Diagnosis present

## 2016-09-21 LAB — TROPONIN I
Troponin I: 0.03 ng/mL (ref <0.03)
Troponin I: 0.03 ng/mL (ref <0.03)

## 2016-09-21 LAB — BASIC METABOLIC PANEL
ANION GAP: 6 (ref 5–15)
BUN: 34 mg/dL — AB (ref 6–20)
CO2: 27 mmol/L (ref 22–32)
Calcium: 8.4 mg/dL — ABNORMAL LOW (ref 8.9–10.3)
Chloride: 106 mmol/L (ref 101–111)
Creatinine, Ser: 4.13 mg/dL — ABNORMAL HIGH (ref 0.61–1.24)
GFR calc Af Amer: 17 mL/min — ABNORMAL LOW (ref >60)
GFR calc non Af Amer: 15 mL/min — ABNORMAL LOW (ref >60)
Glucose, Bld: 120 mg/dL — ABNORMAL HIGH (ref 65–99)
POTASSIUM: 3.6 mmol/L (ref 3.5–5.1)
Sodium: 139 mmol/L (ref 135–145)

## 2016-09-21 LAB — CBC
HEMATOCRIT: 34.8 % — AB (ref 40.0–52.0)
HEMOGLOBIN: 12.3 g/dL — AB (ref 13.0–18.0)
MCH: 29.6 pg (ref 26.0–34.0)
MCHC: 35.3 g/dL (ref 32.0–36.0)
MCV: 84 fL (ref 80.0–100.0)
Platelets: 152 10*3/uL (ref 150–440)
RBC: 4.15 MIL/uL — AB (ref 4.40–5.90)
RDW: 14.1 % (ref 11.5–14.5)
WBC: 6.6 10*3/uL (ref 3.8–10.6)

## 2016-09-21 MED ORDER — HYDRALAZINE HCL 50 MG PO TABS
100.0000 mg | ORAL_TABLET | Freq: Three times a day (TID) | ORAL | Status: DC
  Administered 2016-09-21 – 2016-09-27 (×18): 100 mg via ORAL
  Filled 2016-09-21 (×18): qty 2

## 2016-09-21 MED ORDER — SODIUM CHLORIDE 0.9% FLUSH
3.0000 mL | Freq: Two times a day (BID) | INTRAVENOUS | Status: DC
  Administered 2016-09-21 – 2016-09-27 (×12): 3 mL via INTRAVENOUS

## 2016-09-21 MED ORDER — SODIUM CHLORIDE 0.45 % IV SOLN
INTRAVENOUS | Status: AC
  Administered 2016-09-21: 1000 mL via INTRAVENOUS

## 2016-09-21 MED ORDER — ASPIRIN EC 81 MG PO TBEC
81.0000 mg | DELAYED_RELEASE_TABLET | Freq: Every day | ORAL | Status: DC
  Administered 2016-09-21 – 2016-09-27 (×7): 81 mg via ORAL
  Filled 2016-09-21 (×7): qty 1

## 2016-09-21 MED ORDER — ACETAMINOPHEN 325 MG PO TABS
650.0000 mg | ORAL_TABLET | Freq: Four times a day (QID) | ORAL | Status: DC | PRN
  Administered 2016-09-21 – 2016-09-26 (×5): 650 mg via ORAL
  Filled 2016-09-21 (×5): qty 2

## 2016-09-21 MED ORDER — SENNA 8.6 MG PO TABS
1.0000 | ORAL_TABLET | Freq: Two times a day (BID) | ORAL | Status: DC
  Administered 2016-09-21 – 2016-09-27 (×12): 8.6 mg via ORAL
  Filled 2016-09-21 (×12): qty 1

## 2016-09-21 MED ORDER — TRAZODONE HCL 100 MG PO TABS
100.0000 mg | ORAL_TABLET | Freq: Every evening | ORAL | Status: DC | PRN
  Administered 2016-09-22 – 2016-09-25 (×4): 100 mg via ORAL
  Filled 2016-09-21 (×4): qty 1

## 2016-09-21 MED ORDER — GI COCKTAIL ~~LOC~~
30.0000 mL | Freq: Once | ORAL | Status: AC
  Administered 2016-09-21: 30 mL via ORAL
  Filled 2016-09-21: qty 30

## 2016-09-21 MED ORDER — SODIUM CHLORIDE 0.9 % IV BOLUS (SEPSIS)
500.0000 mL | Freq: Once | INTRAVENOUS | Status: AC
  Administered 2016-09-21: 500 mL via INTRAVENOUS

## 2016-09-21 MED ORDER — HEPARIN SODIUM (PORCINE) 5000 UNIT/ML IJ SOLN
5000.0000 [IU] | Freq: Three times a day (TID) | INTRAMUSCULAR | Status: DC
  Administered 2016-09-21 – 2016-09-27 (×17): 5000 [IU] via SUBCUTANEOUS
  Filled 2016-09-21 (×17): qty 1

## 2016-09-21 MED ORDER — SUCRALFATE 1 G PO TABS
1.0000 g | ORAL_TABLET | Freq: Three times a day (TID) | ORAL | Status: DC
  Administered 2016-09-21 – 2016-09-27 (×23): 1 g via ORAL
  Filled 2016-09-21 (×24): qty 1

## 2016-09-21 MED ORDER — AMLODIPINE BESYLATE 10 MG PO TABS
10.0000 mg | ORAL_TABLET | Freq: Every day | ORAL | Status: DC
  Administered 2016-09-21 – 2016-09-27 (×7): 10 mg via ORAL
  Filled 2016-09-21: qty 1
  Filled 2016-09-21: qty 2
  Filled 2016-09-21 (×5): qty 1

## 2016-09-21 MED ORDER — BUTALBITAL-APAP-CAFFEINE 50-325-40 MG PO TABS
1.0000 | ORAL_TABLET | ORAL | Status: DC | PRN
  Administered 2016-09-21 (×2): 1 via ORAL
  Filled 2016-09-21 (×3): qty 1

## 2016-09-21 MED ORDER — HYDRALAZINE HCL 20 MG/ML IJ SOLN
20.0000 mg | Freq: Once | INTRAMUSCULAR | Status: AC
  Administered 2016-09-21: 20 mg via INTRAVENOUS
  Filled 2016-09-21: qty 1

## 2016-09-21 MED ORDER — OXYCODONE HCL ER 10 MG PO T12A
20.0000 mg | EXTENDED_RELEASE_TABLET | Freq: Two times a day (BID) | ORAL | Status: DC
  Administered 2016-09-21 – 2016-09-27 (×12): 20 mg via ORAL
  Filled 2016-09-21 (×12): qty 2

## 2016-09-21 MED ORDER — TAMSULOSIN HCL 0.4 MG PO CAPS
0.4000 mg | ORAL_CAPSULE | Freq: Every day | ORAL | Status: DC
  Administered 2016-09-22 – 2016-09-27 (×6): 0.4 mg via ORAL
  Filled 2016-09-21 (×8): qty 1

## 2016-09-21 MED ORDER — LABETALOL HCL 5 MG/ML IV SOLN
20.0000 mg | INTRAVENOUS | Status: DC | PRN
  Administered 2016-09-21: 20 mg via INTRAVENOUS
  Filled 2016-09-21: qty 4

## 2016-09-21 MED ORDER — HYDRALAZINE HCL 20 MG/ML IJ SOLN
10.0000 mg | Freq: Once | INTRAMUSCULAR | Status: AC
  Administered 2016-09-21: 10 mg via INTRAVENOUS
  Filled 2016-09-21: qty 1

## 2016-09-21 MED ORDER — CLOPIDOGREL BISULFATE 75 MG PO TABS
75.0000 mg | ORAL_TABLET | Freq: Every day | ORAL | Status: DC
  Administered 2016-09-21 – 2016-09-27 (×7): 75 mg via ORAL
  Filled 2016-09-21 (×7): qty 1

## 2016-09-21 MED ORDER — ENOXAPARIN SODIUM 40 MG/0.4ML ~~LOC~~ SOLN: 40.0000 mg | SUBCUTANEOUS | Status: DC

## 2016-09-21 MED ORDER — KETOROLAC TROMETHAMINE 30 MG/ML IJ SOLN: 15.0000 mg | Freq: Once | INTRAMUSCULAR | Status: DC

## 2016-09-21 MED ORDER — OXYCODONE HCL 5 MG PO TABS
10.0000 mg | ORAL_TABLET | ORAL | Status: DC | PRN
  Administered 2016-09-21 – 2016-09-24 (×14): 10 mg via ORAL
  Filled 2016-09-21 (×14): qty 2

## 2016-09-21 MED ORDER — POLYETHYLENE GLYCOL 3350 17 G PO PACK
17.0000 g | PACK | Freq: Every day | ORAL | Status: DC | PRN
Start: 2016-09-21 — End: 2016-09-26
  Administered 2016-09-25: 17 g via ORAL
  Filled 2016-09-21: qty 1

## 2016-09-21 MED ORDER — ACETAMINOPHEN 650 MG RE SUPP
650.0000 mg | Freq: Four times a day (QID) | RECTAL | Status: DC | PRN
Start: 2016-09-21 — End: 2016-09-27

## 2016-09-21 MED ORDER — CARVEDILOL 12.5 MG PO TABS
12.5000 mg | ORAL_TABLET | Freq: Two times a day (BID) | ORAL | Status: DC
  Administered 2016-09-21 – 2016-09-22 (×2): 12.5 mg via ORAL
  Filled 2016-09-21: qty 1
  Filled 2016-09-21: qty 2

## 2016-09-21 MED ORDER — HYDROMORPHONE HCL 2 MG PO TABS
2.0000 mg | ORAL_TABLET | Freq: Once | ORAL | Status: AC
  Administered 2016-09-21: 2 mg via ORAL
  Filled 2016-09-21: qty 1

## 2016-09-21 MED ORDER — ONDANSETRON HCL 4 MG PO TABS: 4.0000 mg | ORAL_TABLET | Freq: Four times a day (QID) | ORAL | Status: DC | PRN

## 2016-09-21 MED ORDER — ATORVASTATIN CALCIUM 20 MG PO TABS
40.0000 mg | ORAL_TABLET | Freq: Every day | ORAL | Status: DC
  Administered 2016-09-21 – 2016-09-27 (×7): 40 mg via ORAL
  Filled 2016-09-21 (×7): qty 2

## 2016-09-21 MED ORDER — ONDANSETRON HCL 4 MG/2ML IJ SOLN: 4.0000 mg | Freq: Four times a day (QID) | INTRAMUSCULAR | Status: DC | PRN

## 2016-09-21 MED ORDER — ISOSORBIDE MONONITRATE ER 60 MG PO TB24
60.0000 mg | ORAL_TABLET | Freq: Every day | ORAL | Status: DC
  Administered 2016-09-21 – 2016-09-27 (×7): 60 mg via ORAL
  Filled 2016-09-21 (×7): qty 1

## 2016-09-21 MED ORDER — ALBUTEROL SULFATE (2.5 MG/3ML) 0.083% IN NEBU: 2.5000 mg | INHALATION_SOLUTION | RESPIRATORY_TRACT | Status: DC | PRN

## 2016-09-21 NOTE — ED Notes (Signed)
Received critical lab troponin 0.03.  Chg nurse and dr Nile Riggs notified.  Pt moved to room 1.

## 2016-09-21 NOTE — H&P (Signed)
[facility] [place] NAME: [name]    MR#:  [id number]  DATE OF BIRTH:  1955-10-13  DATE OF ADMISSION:  09/21/2016  PRIMARY CARE PHYSICIAN: No PCP Per Patient   REQUESTING/REFERRING PHYSICIAN: Dr. Marcelene Butte  CHIEF COMPLAINT:   Chief Complaint  Patient presents with  . Chest Pain    HISTORY OF PRESENT ILLNESS:  [name]  is a 61 y.o. male with a known history of Hypertension, noncompliance, depression presents to the emergency room complaining of 2 days of chest pain and headache. Patient has had recurrent ER visits for similar complaints. Today he has been found to have significantly with blood pressure with systolic greater than 371. He has received total of 30 mg of IV hydralazine divided into 10 mg and 20 mg. In spite of this blood pressure significantly elevated. He tells me he has been compliant with medications and blood pressure always runs this high. Troponin 2 is negative.  PAST MEDICAL HISTORY:   Past Medical History:  Diagnosis Date  . Depression   . Heart attack   . Hypertension   . PE (pulmonary embolism)   . Renal disorder     PAST SURGICAL HISTORY:   Past Surgical History:  Procedure Laterality Date  . TOTAL HIP ARTHROPLASTY      SOCIAL HISTORY:   Social History  Substance Use Topics  . Smoking status: Current Every Day Smoker    Packs/day: 0.50    Types: Cigarettes  . Smokeless tobacco: Never Used  . Alcohol use No    FAMILY HISTORY:  No family history on file.  DRUG ALLERGIES:   Allergies  Allergen Reactions  . Cyclobenzaprine Nausea Only  . Clonidine Other (See Comments)    Asymptomatic bradycardia to 38  . Furosemide Other (See Comments)    Caused renal failure  . Tylenol [Acetaminophen] Other (See Comments)    Reaction:  Pt states it bothers his liver    REVIEW OF SYSTEMS:   Review of Systems  Constitutional: Positive for malaise/fatigue. Negative for chills, fever and  weight loss.  HENT: Negative for hearing loss and nosebleeds.   Eyes: Negative for blurred vision, double vision and pain.  Respiratory: Negative for cough, hemoptysis, sputum production, shortness of breath and wheezing.   Cardiovascular: Positive for chest pain. Negative for palpitations, orthopnea and leg swelling.  Gastrointestinal: Negative for abdominal pain, constipation, diarrhea, nausea and vomiting.  Genitourinary: Negative for dysuria and hematuria.  Musculoskeletal: Negative for back pain, falls and myalgias.  Skin: Negative for rash.  Neurological: Positive for weakness and headaches. Negative for dizziness, tremors, sensory change, speech change, focal weakness and seizures.  Endo/Heme/Allergies: Does not bruise/bleed easily.  Psychiatric/Behavioral: Negative for depression and memory loss. The patient is not nervous/anxious.     MEDICATIONS AT HOME:   Prior to Admission medications   Medication Sig Start Date End Date Taking? Authorizing Provider  amLODipine (NORVASC) 10 MG tablet Take 10 mg by mouth daily. 11/27/15  Yes Historical Provider, MD  aspirin EC 81 MG tablet Take 81 mg by mouth daily.   Yes Historical Provider, MD  atorvastatin (LIPITOR) 40 MG tablet Take 40 mg by mouth daily. 11/27/15  Yes Historical Provider, MD  carvedilol (COREG) 6.25 MG tablet Take 6.25 mg by mouth 2 (two) times daily. 06/02/16  Yes Historical Provider, MD  clopidogrel (PLAVIX) 75 MG tablet Take 75 mg by mouth daily. 06/21/16  Yes Historical Provider, MD  hydrALAZINE (APRESOLINE) 100 MG tablet  Take 1 tablet (100 mg total) by mouth 3 (three) times daily. Patient taking differently: Take 100 mg by mouth 2 (two) times daily.  01/18/16  Yes Vipul Manuella Ghazi, MD  oxyCODONE (OXYCONTIN) 20 mg 12 hr tablet Take 20 mg by mouth every 12 (twelve) hours. 04/11/16  Yes Historical Provider, MD  Oxycodone HCl 10 MG TABS Take 10-20 mg by mouth every 4 (four) hours as needed (for pain).  04/11/16  Yes Historical  Provider, MD  polyethylene glycol (MIRALAX / GLYCOLAX) packet Take 17 g by mouth daily as needed for mild constipation.  11/27/15  Yes Historical Provider, MD  potassium chloride (K-DUR) 10 MEQ tablet Take 1 tablet (10 mEq total) by mouth daily. 02/03/16  Yes Rudene Re, MD  sucralfate (CARAFATE) 1 g tablet Take 1 tablet (1 g total) by mouth 4 (four) times daily. 05/29/16  Yes Carrie Mew, MD  tamsulosin (FLOMAX) 0.4 MG CAPS capsule Take 0.4 mg by mouth daily as needed (for urinary).  11/03/15  Yes Historical Provider, MD  traZODone (DESYREL) 100 MG tablet Take 100 mg by mouth at bedtime as needed for sleep.  10/22/15  Yes Historical Provider, MD  famotidine (PEPCID) 20 MG tablet Take 1 tablet (20 mg total) by mouth 2 (two) times daily. Patient not taking: Reported on 09/21/2016 05/29/16   Carrie Mew, MD  hydrochlorothiazide (HYDRODIURIL) 25 MG tablet Take 25 mg by mouth daily. 11/27/15   Historical Provider, MD     VITAL SIGNS:  Blood pressure (!) 166/105, pulse 64, temperature 97.7 F (36.5 C), temperature source Oral, resp. rate (!) 22, height 6' (1.829 m), weight 124.7 kg (275 lb), SpO2 98 %.  PHYSICAL EXAMINATION:  Physical Exam  GENERAL:  61 y.o.-year-old patient lying in the bed. In distress due to pain EYES: Pupils equal, round, reactive to light and accommodation. No scleral icterus. Extraocular muscles intact.  HEENT: Head atraumatic, normocephalic. Oropharynx and nasopharynx clear. No oropharyngeal erythema, moist oral mucosa  NECK:  Supple, no jugular venous distention. No thyroid enlargement, no tenderness.  LUNGS: Normal breath sounds bilaterally, no wheezing, rales, rhonchi. No use of accessory muscles of respiration.  CARDIOVASCULAR: S1, S2 normal. No murmurs, rubs, or gallops.  ABDOMEN: Soft, nontender, nondistended. Bowel sounds present. No organomegaly or mass.  EXTREMITIES: No pedal edema, cyanosis, or clubbing. + 2 pedal & radial pulses b/l.   NEUROLOGIC:  Cranial nerves II through XII are intact. No focal Motor or sensory deficits appreciated b/l PSYCHIATRIC: The patient is alert and oriented x 3. Good affect.  SKIN: No obvious rash, lesion, or ulcer.   LABORATORY PANEL:   CBC  Recent Labs Lab 09/21/16 1009  WBC 6.6  HGB 12.3*  HCT 34.8*  PLT 152   ------------------------------------------------------------------------------------------------------------------  Chemistries   Recent Labs Lab 09/21/16 1009  NA 139  K 3.6  CL 106  CO2 27  GLUCOSE 120*  BUN 34*  CREATININE 4.13*  CALCIUM 8.4*   ------------------------------------------------------------------------------------------------------------------  Cardiac Enzymes  Recent Labs Lab 09/21/16 1334  TROPONINI 0.03*   ------------------------------------------------------------------------------------------------------------------  RADIOLOGY:  Dg Chest 2 View  Result Date: 09/21/2016 CLINICAL DATA:  Two days of chest pain with cough and congestion and headache. Current smoker. EXAM: CHEST  2 VIEW COMPARISON:  Chest x-ray of May 29, 2016 FINDINGS: The lungs are well-expanded. There is no focal infiltrate. There is no pleural effusion. The heart and pulmonary vascularity are normal. There is tortuosity of the ascending and descending thoracic aorta. The observed bony thorax is unremarkable. IMPRESSION: There  is no pneumonia, CHF, nor other acute cardiopulmonary abnormality. Electronically Signed   By: David  Martinique M.D.   On: 09/21/2016 12:25   Ct Head Wo Contrast  Result Date: 09/21/2016 CLINICAL DATA:  Chest pain, headache for 2 days. EXAM: CT HEAD WITHOUT CONTRAST TECHNIQUE: Contiguous axial images were obtained from the base of the skull through the vertex without intravenous contrast. COMPARISON:  MRI 01/18/2016 FINDINGS: Brain: Mild chronic small vessel disease changes throughout the deep white matter. No acute intracranial abnormality. Specifically, no  hemorrhage, hydrocephalus, mass lesion, acute infarction, or significant intracranial injury. Vascular: No hyperdense vessel or unexpected calcification. Skull: No acute calvarial abnormality. Sinuses/Orbits: Visualized paranasal sinuses and mastoids clear. Orbital soft tissues unremarkable. Other: None IMPRESSION: Chronic small vessel disease throughout the deep white matter. No acute intracranial abnormality. Electronically Signed   By: Rolm Baptise M.D.   On: 09/21/2016 13:55     IMPRESSION AND PLAN:   * Hypertensive urgency Patient has received 2 doses of IV hydralazine of 10 and 20 mg and in spite of this blood pressure is significantly elevated with systolic greater than 034 and diastolic greater than 917. Patient will be admitted for further management. He has long history of noncompliance with medications but presently tells me that he has been taking medications and his blood pressure always tends to run as high as this. Is on amlodipine, Coreg, hydrochlorothiazide, hydralazine. He does not remember the last time his blood pressure was checked. Will increase dose of Coreg, continue amlodipine. Add Imdur. IV labetalol when necessary. Hold hydrochlorothiazide due to elevated creatinine. Continue oral hydralazine.  * AKI over CKD4 Start IV fluids. This could be acute kidney injury due to his hypertensive emergency. Or progressively worsening chronic kidney disease. Monitor input and output. Repeat labs in the morning.   * Headache. Likely migraine versus due to hypertension. Patient has oxycodone and OxyContin from home. We'll also add Fioricet.  * Chest pain, musculoskeletal. Troponin 2 negative. Monitor on telemetry. Patient takes oxycodone and OxyContin at home. Continue these.  * Depression. Continue home medications.  * DVT prophylaxis with Lovenox  All the records are reviewed and case discussed with ED provider. Management plans discussed with the patient, family and they are  in agreement.  CODE STATUS:  FULL  TOTAL TIME TAKING CARE OF THIS PATIENT: 40 minutes.   Hillary Bow R M.D on 09/21/2016 at 3:19 PM  Between 7am to 6pm - Pager - [phone]  After 6pm go to [email or web address] - password [email or web address] [facility]  Office  [phone]  CC: Primary care physician; No PCP Per Patient  Note: This dictation was prepared with Dragon dictation along with smaller phrase technology. Any transcriptional errors that result from this process are unintentional.

## 2016-09-21 NOTE — ED Triage Notes (Signed)
Pt with chest pain for two days and headache.

## 2016-09-21 NOTE — ED Provider Notes (Signed)
Time Seen: Approximately 1049  I have reviewed the triage notes  Chief Complaint: Chest Pain   History of Present Illness: [name] is a 61 y.o. male * who presents with epigastric abdominal lower chest pain now for the past 3 days. He states is been relatively constant. He denies any radiation of the pain. He denies any fever, chills pleuritic component etc. He denies any arm or jaw pain. Has been taking his medication as prescribed at home though he does have a history of noncompliance. He is on chronic opiates for chronic low back pain. He denies any other new complaints outside of a right-sided headache. He points to the right side of his head and states it does hurt to press on that area. He denies any photophobia, neck pain, focal weakness etc.   Past Medical History:  Diagnosis Date  . Depression   . Heart attack   . Hypertension   . PE (pulmonary embolism)   . Renal disorder     Patient Active Problem List   Diagnosis Date Noted  . Dysthymia 02/26/2016  . Chronic pain 02/26/2016  . Noncompliance 02/26/2016  . Opiate abuse, continuous 02/03/2016  . Substance induced mood disorder ([place]) 02/03/2016  . Antisocial personality disorder 02/03/2016  . Left-sided weakness 01/18/2016  . HEPATITIS C 06/28/2008  . HYPERLIPIDEMIA 06/28/2008  . SUBSTANCE ABUSE, MULTIPLE 06/28/2008  . Hypertension 06/27/2008    Past Surgical History:  Procedure Laterality Date  . TOTAL HIP ARTHROPLASTY      Past Surgical History:  Procedure Laterality Date  . TOTAL HIP ARTHROPLASTY      Current Outpatient Rx  . Order #: [id number] Class: Historical Med  . Order #: [id number] Class: Historical Med  . Order #: [id number] Class: Historical Med  . Order #: [id number] Class: Historical Med  . Order #: [id number] Class: Historical Med  . Order #: [id number] Class: Normal  . Order #: [id number] Class: Historical Med  . Order #: [id number] Class: Historical Med  . Order #: [id number] Class:  Historical Med  . Order #: [id number] Class: Print  . Order #: [id number] Class: Print  . Order #: [id number] Class: Historical Med  . Order #: [id number] Class: Historical Med  . Order #: [id number] Class: Print  . Order #: [id number] Class: Historical Med    Allergies:  Cyclobenzaprine; Clonidine; Furosemide; and Tylenol [acetaminophen]  Family History: No family history on file.  Social History: Social History  Substance Use Topics  . Smoking status: Current Every Day Smoker    Packs/day: 0.50    Types: Cigarettes  . Smokeless tobacco: Never Used  . Alcohol use No     Review of Systems:   10 point review of systems was performed and was otherwise negative:  Constitutional: No fever Eyes: No visual disturbances. No eye pain ENT: No sore throat, ear pain Cardiac: Patient points mainly to the xiphoid region as the source of his discomfort. Eyes any thoracic back pain. Respiratory: No shortness of breath, wheezing, or stridor Abdomen: No abdominal pain, no vomiting, No diarrhea Endocrine: No weight loss, No night sweats Extremities: No peripheral edema, cyanosis Skin: No rashes, easy bruising Neurologic: No focal weakness, trouble with speech or swollowing Urologic: No dysuria, Hematuria, or urinary frequency   Physical Exam:  ED Triage Vitals  Enc Vitals Group     BP 09/21/16 1003 (!) 179/99     Pulse Rate 09/21/16 1003 90     Resp 09/21/16 1003 18     Temp 09/21/16 1003 97.7 F (36.5 C)     Temp Source  09/21/16 1003 Oral     SpO2 09/21/16 1003 96 %     Weight 09/21/16 1003 275 lb (124.7 kg)     Height 09/21/16 1003 6' (1.829 m)     Head Circumference --      Peak Flow --      Pain Score 09/21/16 1012 9     Pain Loc --      Pain Edu? --      Excl. in [place]? --     General: Awake , Alert , and Oriented times 3; GCS 15 Head: Normal cephalic , atraumatic. Reproducible headache with palpation across the parietal and temporal frontal regions. No palpable cords Eyes:  Pupils equal , round, reactive to light. No papilledema Nose/Throat: No nasal drainage, patent upper airway without erythema or exudate.  Neck: Supple, Full range of motion, No anterior adenopathy or palpable thyroid masses Lungs: Clear to ascultation without wheezes , rhonchi, or rales Heart: Regular rate, regular rhythm without murmurs , gallops , or rubs Abdomen:Reproducible pain over the epigastric region Soft, non tender without rebound, guarding , or rigidity; bowel sounds positive and symmetric in all 4 quadrants. No organomegaly .        Extremities: 2 plus symmetric pulses. No edema, clubbing or cyanosis Neurologic: normal ambulation, Motor symmetric without deficits, sensory intact Skin: warm, dry, no rashes   Labs:   All laboratory work was reviewed including any pertinent negatives or positives listed below:  Labs Reviewed  BASIC METABOLIC PANEL - Abnormal; Notable for the following:       Result Value   Glucose, Bld 120 (*)    BUN 34 (*)    Creatinine, Ser 4.13 (*)    Calcium 8.4 (*)    GFR calc non Af Amer 15 (*)    GFR calc Af Amer 17 (*)    All other components within normal limits  CBC - Abnormal; Notable for the following:    RBC 4.15 (*)    Hemoglobin 12.3 (*)    HCT 34.8 (*)    All other components within normal limits  TROPONIN I - Abnormal; Notable for the following:    Troponin I 0.03 (*)    All other components within normal limits  TROPONIN I   Patient has chronic renal insufficiency those creatinine is slightly worse today at 4.13.  Serial troponins remain at 0.03  EKG: * ED ECG REPORT I, Daymon Larsen, the attending physician, personally viewed and interpreted this ECG.  Date: 09/21/2016 EKG Time: 1001 Rate: 74 Rhythm: normal sinus rhythm with sinus arrhythmia QRS Axis: normal Intervals: normal ST/T Wave abnormalities: Nonspecific T wave abnormalities Conduction Disturbances: none Narrative Interpretation: unremarkable No acute ischemic  changes are noted   Radiology:  "Dg Chest 2 View  Result Date: 09/21/2016 CLINICAL DATA:  Two days of chest pain with cough and congestion and headache. Current smoker. EXAM: CHEST  2 VIEW COMPARISON:  Chest x-ray of May 29, 2016 FINDINGS: The lungs are well-expanded. There is no focal infiltrate. There is no pleural effusion. The heart and pulmonary vascularity are normal. There is tortuosity of the ascending and descending thoracic aorta. The observed bony thorax is unremarkable. IMPRESSION: There is no pneumonia, CHF, nor other acute cardiopulmonary abnormality. Electronically Signed   By: David  Martinique M.D.   On: 09/21/2016 12:25   Ct Head Wo Contrast  Result Date: 09/21/2016 CLINICAL DATA:  Chest pain, headache for 2 days. EXAM: CT HEAD WITHOUT CONTRAST TECHNIQUE: Contiguous axial images were  obtained from the base of the skull through the vertex without intravenous contrast. COMPARISON:  MRI 01/18/2016 FINDINGS: Brain: Mild chronic small vessel disease changes throughout the deep white matter. No acute intracranial abnormality. Specifically, no hemorrhage, hydrocephalus, mass lesion, acute infarction, or significant intracranial injury. Vascular: No hyperdense vessel or unexpected calcification. Skull: No acute calvarial abnormality. Sinuses/Orbits: Visualized paranasal sinuses and mastoids clear. Orbital soft tissues unremarkable. Other: None IMPRESSION: Chronic small vessel disease throughout the deep white matter. No acute intracranial abnormality. Electronically Signed   By: Rolm Baptise M.D.   On: 09/21/2016 13:55  " "  I personally reviewed the radiologic studies  CRITICAL CARE Performed by: Daymon Larsen   Total critical care time: 31 minutes  Critical care time was exclusive of separately billable procedures and treating other patients.  Critical care was necessary to treat or prevent imminent or life-threatening deterioration.  Critical care was time spent personally  by me on the following activities: development of treatment plan with patient and/or surrogate as well as nursing, discussions with consultants, evaluation of patient's response to treatment, examination of patient, obtaining history from patient or surrogate, ordering and performing treatments and interventions, ordering and review of laboratory studies, ordering and review of radiographic studies, pulse oximetry and re-evaluation of patient's condition. IV boluses of medication for hypertensive emergency  ED Course:  Patient states he's been compliant with his blood pressure medication yet arrives and persist to be hypertensive. He was given 10 mg of IV hydralazine followed by 20 mg of hydralazine IV without resolution of his hypertension. He had a brief drop in the systolic pressures down to 150 range. Serial troponins are negative and his chest pain seems to be very atypical and unlikely to be cardiogenic in nature. Her persistent headache head CT showed no significant abnormalities at this time. May be related to his blood pressure being elevated or more likely a tension headache. I felt there was no risk for subarachnoid hemorrhage, intracerebral hemorrhage, cavernous venous thrombosis, meningitis, etc.     Assessment:  Hypertensive emergency Exacerbation of renal insufficiency    Plan: Internal medicine evaluation and likely inpatient management            Daymon Larsen, MD 09/21/16 1451

## 2016-09-22 LAB — BASIC METABOLIC PANEL
ANION GAP: 7 (ref 5–15)
BUN: 34 mg/dL — ABNORMAL HIGH (ref 6–20)
CALCIUM: 8.3 mg/dL — AB (ref 8.9–10.3)
CO2: 27 mmol/L (ref 22–32)
Chloride: 104 mmol/L (ref 101–111)
Creatinine, Ser: 3.86 mg/dL — ABNORMAL HIGH (ref 0.61–1.24)
GFR, EST AFRICAN AMERICAN: 18 mL/min — AB (ref >60)
GFR, EST NON AFRICAN AMERICAN: 16 mL/min — AB (ref >60)
Glucose, Bld: 120 mg/dL — ABNORMAL HIGH (ref 65–99)
POTASSIUM: 4.1 mmol/L (ref 3.5–5.1)
Sodium: 138 mmol/L (ref 135–145)

## 2016-09-22 MED ORDER — CARVEDILOL 25 MG PO TABS: 25.0000 mg | ORAL_TABLET | Freq: Two times a day (BID) | ORAL | Status: DC

## 2016-09-22 MED ORDER — SODIUM CHLORIDE 0.45 % IV SOLN
INTRAVENOUS | Status: AC
  Administered 2016-09-22: 10:00:00 via INTRAVENOUS

## 2016-09-22 MED ORDER — LABETALOL HCL 100 MG PO TABS
300.0000 mg | ORAL_TABLET | Freq: Three times a day (TID) | ORAL | Status: DC
  Administered 2016-09-22 – 2016-09-27 (×16): 300 mg via ORAL
  Filled 2016-09-22 (×2): qty 3
  Filled 2016-09-22 (×3): qty 1
  Filled 2016-09-22: qty 3
  Filled 2016-09-22 (×2): qty 1
  Filled 2016-09-22: qty 3
  Filled 2016-09-22 (×4): qty 1
  Filled 2016-09-22: qty 3
  Filled 2016-09-22 (×2): qty 1

## 2016-09-22 NOTE — Care Management (Signed)
Patient admits to not taking his medical issues seriously.  He has worsening frenal issues and states he does not want not have to go on dialysis.  He is moving to [place] for permanent residency by the end of the month.  Independent in all adls, denies issues accessing medical care, obtaining medications or with transportation.  Current with  PCP.  Patient declines need for any home health services and verbally confirms he has pharmacy coverage with his medicare.

## 2016-09-22 NOTE — Care Management Obs Status (Signed)
MEDICARE OBSERVATION STATUS NOTIFICATION   Patient Details  Name: LENN VOLKER MRN: [id number] Date of Birth: [birth year]   Medicare Observation Status Notification Given:  Yes    Katrina Stack, RN 09/22/2016, 12:16 PM

## 2016-09-22 NOTE — Progress Notes (Signed)
[place] at [facility]                                                                                                                                                                                  Patient Demographics   [name], is a 61 y.o. male, DOB - [birth year], QIO:[id number]  Admit date - 09/21/2016   Admitting Physician Hillary Bow, MD  Outpatient Primary MD for the patient is No PCP Per Patient   LOS - 0  Subjective: Patient admitted with chest pain and elevated blood pressure and headache. He reports that his symptoms have improved    Review of Systems:   CONSTITUTIONAL: No documented fever. No fatigue, weakness. No weight gain, no weight loss.  EYES: No blurry or double vision.  ENT: No tinnitus. No postnasal drip. No redness of the oropharynx.  RESPIRATORY: No cough, no wheeze, no hemoptysis. No dyspnea.  CARDIOVASCULAR: Positive chest pain. No orthopnea. No palpitations. No syncope.  GASTROINTESTINAL: No nausea, no vomiting or diarrhea. Positive abdominal pain. No melena or hematochezia.  GENITOURINARY: No dysuria or hematuria.  ENDOCRINE: No polyuria or nocturia. No heat or cold intolerance.  HEMATOLOGY: No anemia. No bruising. No bleeding.  INTEGUMENTARY: No rashes. No lesions.  MUSCULOSKELETAL: No arthritis. No swelling. No gout.  NEUROLOGIC: No numbness, tingling, or ataxia. No seizure-type activity.  Positive headache PSYCHIATRIC: No anxiety. No insomnia. No ADD.    Vitals:   Vitals:   09/21/16 2110 09/21/16 2110 09/22/16 0413 09/22/16 0757  BP: (!) 144/97 (!) 144/97 (!) 153/99 (!) 157/93  Pulse:  68 80 65  Resp:   16 18  Temp:   97.9 F (36.6 C) 98.4 F (36.9 C)  TempSrc:    Oral  SpO2:  97% 94% 100%  Weight:   268 lb 12.8 oz (121.9 kg)   Height:        Wt Readings from Last 3 Encounters:  09/22/16 268 lb 12.8 oz (121.9 kg)  06/23/16 270 lb (122.5 kg)  05/29/16 260 lb (117.9 kg)     Intake/Output Summary  (Last 24 hours) at 09/22/16 1359 Last data filed at 09/22/16 1006  Gross per 24 hour  Intake              720 ml  Output             1000 ml  Net             -280 ml    Physical Exam:   GENERAL: Pleasant-appearing in no apparent distress.  HEAD, EYES, EARS, NOSE AND THROAT: Atraumatic, normocephalic. Extraocular muscles are intact. Pupils equal and reactive to light. Sclerae anicteric.  No conjunctival injection. No oro-pharyngeal erythema.  NECK: Supple. There is no jugular venous distention. No bruits, no lymphadenopathy, no thyromegaly.  HEART: Regular rate and rhythm,. No murmurs, no rubs, no clicks.  LUNGS: Clear to auscultation bilaterally. No rales or rhonchi. No wheezes.  ABDOMEN: Soft, flat, nontender, nondistended. Has good bowel sounds. No hepatosplenomegaly appreciated.  EXTREMITIES: No evidence of any cyanosis, clubbing, or peripheral edema.  +2 pedal and radial pulses bilaterally.  NEUROLOGIC: The patient is alert, awake, and oriented x3 with no focal motor or sensory deficits appreciated bilaterally.  SKIN: Moist and warm with no rashes appreciated.  Psych: Not anxious, depressed LN: No inguinal LN enlargement    Antibiotics   Anti-infectives    None      Medications   Scheduled Meds: . amLODipine  10 mg Oral Daily  . aspirin EC  81 mg Oral Daily  . atorvastatin  40 mg Oral Daily  . clopidogrel  75 mg Oral Daily  . heparin subcutaneous  5,000 Units Subcutaneous Q8H  . hydrALAZINE  100 mg Oral TID  . isosorbide mononitrate  60 mg Oral Daily  . labetalol  300 mg Oral TID  . oxyCODONE  20 mg Oral Q12H  . senna  1 tablet Oral BID  . sodium chloride flush  3 mL Intravenous Q12H  . sucralfate  1 g Oral TID AC & HS  . tamsulosin  0.4 mg Oral QPC breakfast   Continuous Infusions: . sodium chloride 75 mL/hr at 09/22/16 0957   PRN Meds:.acetaminophen **OR** acetaminophen, albuterol, butalbital-acetaminophen-caffeine, labetalol, ondansetron **OR** ondansetron  (ZOFRAN) IV, oxyCODONE, polyethylene glycol, traZODone   Data Review:   Micro Results No results found for this or any previous visit (from the past 240 hour(s)).  Radiology Reports Dg Chest 2 View  Result Date: 09/21/2016 CLINICAL DATA:  Two days of chest pain with cough and congestion and headache. Current smoker. EXAM: CHEST  2 VIEW COMPARISON:  Chest x-ray of May 29, 2016 FINDINGS: The lungs are well-expanded. There is no focal infiltrate. There is no pleural effusion. The heart and pulmonary vascularity are normal. There is tortuosity of the ascending and descending thoracic aorta. The observed bony thorax is unremarkable. IMPRESSION: There is no pneumonia, CHF, nor other acute cardiopulmonary abnormality. Electronically Signed   By: David  Martinique M.D.   On: 09/21/2016 12:25   Ct Head Wo Contrast  Result Date: 09/21/2016 CLINICAL DATA:  Chest pain, headache for 2 days. EXAM: CT HEAD WITHOUT CONTRAST TECHNIQUE: Contiguous axial images were obtained from the base of the skull through the vertex without intravenous contrast. COMPARISON:  MRI 01/18/2016 FINDINGS: Brain: Mild chronic small vessel disease changes throughout the deep white matter. No acute intracranial abnormality. Specifically, no hemorrhage, hydrocephalus, mass lesion, acute infarction, or significant intracranial injury. Vascular: No hyperdense vessel or unexpected calcification. Skull: No acute calvarial abnormality. Sinuses/Orbits: Visualized paranasal sinuses and mastoids clear. Orbital soft tissues unremarkable. Other: None IMPRESSION: Chronic small vessel disease throughout the deep white matter. No acute intracranial abnormality. Electronically Signed   By: Rolm Baptise M.D.   On: 09/21/2016 13:55     CBC  Recent Labs Lab 09/21/16 1009  WBC 6.6  HGB 12.3*  HCT 34.8*  PLT 152  MCV 84.0  MCH 29.6  MCHC 35.3  RDW 14.1    Chemistries   Recent Labs Lab 09/21/16 1009 09/22/16 0311  NA 139 138  K 3.6 4.1   CL 106 104  CO2 27 27  GLUCOSE 120* 120*  BUN 34* 34*  CREATININE 4.13* 3.86*  CALCIUM 8.4* 8.3*   ------------------------------------------------------------------------------------------------------------------ estimated creatinine clearance is 28.1 mL/min (A) (by C-G formula based on SCr of 3.86 mg/dL (H)). ------------------------------------------------------------------------------------------------------------------ No results for input(s): HGBA1C in the last 72 hours. ------------------------------------------------------------------------------------------------------------------ No results for input(s): CHOL, HDL, LDLCALC, TRIG, CHOLHDL, LDLDIRECT in the last 72 hours. ------------------------------------------------------------------------------------------------------------------ No results for input(s): TSH, T4TOTAL, T3FREE, THYROIDAB in the last 72 hours.  Invalid input(s): FREET3 ------------------------------------------------------------------------------------------------------------------ No results for input(s): VITAMINB12, FOLATE, FERRITIN, TIBC, IRON, RETICCTPCT in the last 72 hours.  Coagulation profile No results for input(s): INR, PROTIME in the last 168 hours.  No results for input(s): DDIMER in the last 72 hours.  Cardiac Enzymes  Recent Labs Lab 09/21/16 1009 09/21/16 1334  TROPONINI 0.03* 0.03*   ------------------------------------------------------------------------------------------------------------------ Invalid input(s): POCBNP    Assessment & Plan   * Hypertensive urgency There is concern for medical noncompliance I will go ahead and increase hydralazine dose I will discontinue Coreg and start him on labetalol Continue Imdur Use when necessary IV hydralazine    * AKI over CKD4 Continue IV fluids some improvement in his renal function. This could be acute kidney injury due to his hypertensive emergency. We'll need outpatient  nephrology follow-up  Monitor input and output. Repeat labs in the morning.   * Headache. Due to accelerated hypertension now improved  * Chest pain, musculoskeletal. Troponin 3negative. Monitor on telemetry. Patient takes oxycodone and OxyContin at home. Continue these.  * Depression. Continue home medications.  * DVT prophylaxis with Lovenox      Code Status Orders        Start     Ordered   09/21/16 1519  Full code  Continuous     09/21/16 1519    Code Status History    Date Active Date Inactive Code Status Order ID Comments User Context   04/28/2016 11:25 PM 04/29/2016  8:43 PM Full Code [id number]  Abigail Butts, PA-C ED   01/18/2016  2:02 AM 01/18/2016  6:48 PM Full Code [id number]  Quintella Baton, MD Inpatient           Consults  None DVT Prophylaxis  Heparin   Lab Results  Component Value Date   PLT 152 09/21/2016     Time Spent in minutes   51min  Greater than 50% of time spent in care coordination and counseling patient regarding the condition and plan of care.   Dustin Flock M.D on 09/22/2016 at 1:59 PM  Between 7am to 6pm - Pager - [phone]  After 6pm go to [email or web address] - password [email or web address] [facility]   Office  [phone]

## 2016-09-23 ENCOUNTER — Observation Stay: Payer: Medicare Other

## 2016-09-23 DIAGNOSIS — R079 Chest pain, unspecified: Secondary | ICD-10-CM | POA: Diagnosis present

## 2016-09-23 DIAGNOSIS — Z79891 Long term (current) use of opiate analgesic: Secondary | ICD-10-CM | POA: Diagnosis not present

## 2016-09-23 DIAGNOSIS — N2889 Other specified disorders of kidney and ureter: Secondary | ICD-10-CM | POA: Diagnosis present

## 2016-09-23 DIAGNOSIS — G894 Chronic pain syndrome: Secondary | ICD-10-CM | POA: Diagnosis present

## 2016-09-23 DIAGNOSIS — N400 Enlarged prostate without lower urinary tract symptoms: Secondary | ICD-10-CM | POA: Diagnosis present

## 2016-09-23 DIAGNOSIS — Z9114 Patient's other noncompliance with medication regimen: Secondary | ICD-10-CM | POA: Diagnosis not present

## 2016-09-23 DIAGNOSIS — D631 Anemia in chronic kidney disease: Secondary | ICD-10-CM | POA: Diagnosis present

## 2016-09-23 DIAGNOSIS — Z886 Allergy status to analgesic agent status: Secondary | ICD-10-CM | POA: Diagnosis not present

## 2016-09-23 DIAGNOSIS — R809 Proteinuria, unspecified: Secondary | ICD-10-CM | POA: Diagnosis present

## 2016-09-23 DIAGNOSIS — Z888 Allergy status to other drugs, medicaments and biological substances status: Secondary | ICD-10-CM | POA: Diagnosis not present

## 2016-09-23 DIAGNOSIS — N184 Chronic kidney disease, stage 4 (severe): Secondary | ICD-10-CM | POA: Diagnosis present

## 2016-09-23 DIAGNOSIS — N189 Chronic kidney disease, unspecified: Secondary | ICD-10-CM

## 2016-09-23 DIAGNOSIS — N281 Cyst of kidney, acquired: Secondary | ICD-10-CM | POA: Diagnosis present

## 2016-09-23 DIAGNOSIS — F17210 Nicotine dependence, cigarettes, uncomplicated: Secondary | ICD-10-CM | POA: Diagnosis present

## 2016-09-23 DIAGNOSIS — I252 Old myocardial infarction: Secondary | ICD-10-CM | POA: Diagnosis not present

## 2016-09-23 DIAGNOSIS — K5900 Constipation, unspecified: Secondary | ICD-10-CM | POA: Diagnosis present

## 2016-09-23 DIAGNOSIS — I161 Hypertensive emergency: Secondary | ICD-10-CM | POA: Diagnosis present

## 2016-09-23 DIAGNOSIS — Z7982 Long term (current) use of aspirin: Secondary | ICD-10-CM | POA: Diagnosis not present

## 2016-09-23 DIAGNOSIS — Z79899 Other long term (current) drug therapy: Secondary | ICD-10-CM | POA: Diagnosis not present

## 2016-09-23 DIAGNOSIS — I2510 Atherosclerotic heart disease of native coronary artery without angina pectoris: Secondary | ICD-10-CM | POA: Diagnosis present

## 2016-09-23 DIAGNOSIS — N179 Acute kidney failure, unspecified: Secondary | ICD-10-CM | POA: Diagnosis present

## 2016-09-23 DIAGNOSIS — F329 Major depressive disorder, single episode, unspecified: Secondary | ICD-10-CM | POA: Diagnosis present

## 2016-09-23 DIAGNOSIS — I1310 Hypertensive heart and chronic kidney disease without heart failure, with stage 1 through stage 4 chronic kidney disease, or unspecified chronic kidney disease: Secondary | ICD-10-CM | POA: Diagnosis present

## 2016-09-23 DIAGNOSIS — Z96649 Presence of unspecified artificial hip joint: Secondary | ICD-10-CM | POA: Diagnosis present

## 2016-09-23 DIAGNOSIS — Z7902 Long term (current) use of antithrombotics/antiplatelets: Secondary | ICD-10-CM | POA: Diagnosis not present

## 2016-09-23 DIAGNOSIS — Z86711 Personal history of pulmonary embolism: Secondary | ICD-10-CM | POA: Diagnosis not present

## 2016-09-23 DIAGNOSIS — N170 Acute kidney failure with tubular necrosis: Secondary | ICD-10-CM | POA: Diagnosis present

## 2016-09-23 LAB — RENAL FUNCTION PANEL
ALBUMIN: 2.7 g/dL — AB (ref 3.5–5.0)
Anion gap: 6 (ref 5–15)
BUN: 35 mg/dL — AB (ref 6–20)
CO2: 27 mmol/L (ref 22–32)
CREATININE: 4.3 mg/dL — AB (ref 0.61–1.24)
Calcium: 7.8 mg/dL — ABNORMAL LOW (ref 8.9–10.3)
Chloride: 101 mmol/L (ref 101–111)
GFR calc Af Amer: 16 mL/min — ABNORMAL LOW (ref >60)
GFR, EST NON AFRICAN AMERICAN: 14 mL/min — AB (ref >60)
GLUCOSE: 103 mg/dL — AB (ref 65–99)
PHOSPHORUS: 4.4 mg/dL (ref 2.5–4.6)
POTASSIUM: 3.9 mmol/L (ref 3.5–5.1)
Sodium: 134 mmol/L — ABNORMAL LOW (ref 135–145)

## 2016-09-23 LAB — HIV ANTIBODY (ROUTINE TESTING W REFLEX): HIV SCREEN 4TH GENERATION: NONREACTIVE

## 2016-09-23 LAB — LIPID PANEL
CHOL/HDL RATIO: 4.2 ratio
CHOLESTEROL: 148 mg/dL (ref 0–200)
HDL: 35 mg/dL — ABNORMAL LOW (ref >40)
LDL Cholesterol: 91 mg/dL (ref 0–99)
Triglycerides: 112 mg/dL (ref <150)
VLDL: 22 mg/dL (ref 0–40)

## 2016-09-23 LAB — PROTEIN / CREATININE RATIO, URINE
CREATININE, URINE: 89 mg/dL
Protein Creatinine Ratio: 2.02 mg/mg{Cre} — ABNORMAL HIGH (ref 0.00–0.15)
TOTAL PROTEIN, URINE: 180 mg/dL

## 2016-09-23 LAB — PHOSPHORUS: PHOSPHORUS: 4.2 mg/dL (ref 2.5–4.6)

## 2016-09-23 MED ORDER — TERAZOSIN HCL 5 MG PO CAPS
5.0000 mg | ORAL_CAPSULE | Freq: Every day | ORAL | Status: DC
  Administered 2016-09-23 – 2016-09-26 (×4): 5 mg via ORAL
  Filled 2016-09-23 (×4): qty 1

## 2016-09-23 NOTE — Progress Notes (Signed)
[place] at [facility]                                                                                                                                                                                  Patient Demographics   [name], is a 61 y.o. male, DOB - Jun 18, 1956, ZOX:[id number]  Admit date - 09/21/2016   Admitting Physician Hillary Bow, MD  Outpatient Primary MD for the patient is No PCP Per Patient   LOS - 0  Subjective: Pt states he not urinating as he should. Cp resolved     Review of Systems:   CONSTITUTIONAL: No documented fever. No fatigue, weakness. No weight gain, no weight loss.  EYES: No blurry or double vision.  ENT: No tinnitus. No postnasal drip. No redness of the oropharynx.  RESPIRATORY: No cough, no wheeze, no hemoptysis. No dyspnea.  CARDIOVASCULAR: Positive chest pain. No orthopnea. No palpitations. No syncope.  GASTROINTESTINAL: No nausea, no vomiting or diarrhea. Positive abdominal pain. No melena or hematochezia.  GENITOURINARY: No dysuria or hematuria.  ENDOCRINE: No polyuria or nocturia. No heat or cold intolerance.  HEMATOLOGY: No anemia. No bruising. No bleeding.  INTEGUMENTARY: No rashes. No lesions.  MUSCULOSKELETAL: No arthritis. No swelling. No gout.  NEUROLOGIC: No numbness, tingling, or ataxia. No seizure-type activity.  Positive headache PSYCHIATRIC: No anxiety. No insomnia. No ADD.    Vitals:   Vitals:   09/22/16 2117 09/23/16 0510 09/23/16 0753 09/23/16 1118  BP: 136/82 123/67 118/81 125/77  Pulse: 64 68 69 64  Resp:  18 18 18   Temp:  98.1 F (36.7 C) 98.2 F (36.8 C) 97.7 F (36.5 C)  TempSrc:  Oral Oral Oral  SpO2:  97% 96% 96%  Weight:  278 lb 9.6 oz (126.4 kg)    Height:        Wt Readings from Last 3 Encounters:  09/23/16 278 lb 9.6 oz (126.4 kg)  06/23/16 270 lb (122.5 kg)  05/29/16 260 lb (117.9 kg)     Intake/Output Summary (Last 24 hours) at 09/23/16 1447 Last data filed at  09/23/16 1404  Gross per 24 hour  Intake          1451.25 ml  Output             1650 ml  Net          -198.75 ml    Physical Exam:   GENERAL: Pleasant-appearing in no apparent distress.  HEAD, EYES, EARS, NOSE AND THROAT: Atraumatic, normocephalic. Extraocular muscles are intact. Pupils equal and reactive to light. Sclerae anicteric. No conjunctival injection. No oro-pharyngeal erythema.  NECK: Supple. There is no jugular venous distention.  No bruits, no lymphadenopathy, no thyromegaly.  HEART: Regular rate and rhythm,. No murmurs, no rubs, no clicks.  LUNGS: Clear to auscultation bilaterally. No rales or rhonchi. No wheezes.  ABDOMEN: Soft, flat, nontender, nondistended. Has good bowel sounds. No hepatosplenomegaly appreciated.  EXTREMITIES: No evidence of any cyanosis, clubbing, or peripheral edema.  +2 pedal and radial pulses bilaterally.  NEUROLOGIC: The patient is alert, awake, and oriented x3 with no focal motor or sensory deficits appreciated bilaterally.  SKIN: Moist and warm with no rashes appreciated.  Psych: Not anxious, depressed LN: No inguinal LN enlargement    Antibiotics   Anti-infectives    None      Medications   Scheduled Meds: . amLODipine  10 mg Oral Daily  . aspirin EC  81 mg Oral Daily  . atorvastatin  40 mg Oral Daily  . clopidogrel  75 mg Oral Daily  . heparin subcutaneous  5,000 Units Subcutaneous Q8H  . hydrALAZINE  100 mg Oral TID  . isosorbide mononitrate  60 mg Oral Daily  . labetalol  300 mg Oral TID  . oxyCODONE  20 mg Oral Q12H  . senna  1 tablet Oral BID  . sodium chloride flush  3 mL Intravenous Q12H  . sucralfate  1 g Oral TID AC & HS  . tamsulosin  0.4 mg Oral QPC breakfast  . terazosin  5 mg Oral QHS   Continuous Infusions:  PRN Meds:.acetaminophen **OR** acetaminophen, albuterol, butalbital-acetaminophen-caffeine, labetalol, ondansetron **OR** ondansetron (ZOFRAN) IV, oxyCODONE, polyethylene glycol, traZODone   Data Review:    Micro Results No results found for this or any previous visit (from the past 240 hour(s)).  Radiology Reports Dg Chest 2 View  Result Date: 09/21/2016 CLINICAL DATA:  Two days of chest pain with cough and congestion and headache. Current smoker. EXAM: CHEST  2 VIEW COMPARISON:  Chest x-ray of May 29, 2016 FINDINGS: The lungs are well-expanded. There is no focal infiltrate. There is no pleural effusion. The heart and pulmonary vascularity are normal. There is tortuosity of the ascending and descending thoracic aorta. The observed bony thorax is unremarkable. IMPRESSION: There is no pneumonia, CHF, nor other acute cardiopulmonary abnormality. Electronically Signed   By: David  Martinique M.D.   On: 09/21/2016 12:25   Ct Head Wo Contrast  Result Date: 09/21/2016 CLINICAL DATA:  Chest pain, headache for 2 days. EXAM: CT HEAD WITHOUT CONTRAST TECHNIQUE: Contiguous axial images were obtained from the base of the skull through the vertex without intravenous contrast. COMPARISON:  MRI 01/18/2016 FINDINGS: Brain: Mild chronic small vessel disease changes throughout the deep white matter. No acute intracranial abnormality. Specifically, no hemorrhage, hydrocephalus, mass lesion, acute infarction, or significant intracranial injury. Vascular: No hyperdense vessel or unexpected calcification. Skull: No acute calvarial abnormality. Sinuses/Orbits: Visualized paranasal sinuses and mastoids clear. Orbital soft tissues unremarkable. Other: None IMPRESSION: Chronic small vessel disease throughout the deep white matter. No acute intracranial abnormality. Electronically Signed   By: Rolm Baptise M.D.   On: 09/21/2016 13:55   US Renal  Result Date: 09/23/2016 CLINICAL DATA:  Acute renal failure. EXAM: RENAL / URINARY TRACT ULTRASOUND COMPLETE COMPARISON:  CT 06/23/2016, 10/19/2014, 01/01/2014. FINDINGS: Right Kidney: Length: 10.8 cm. Echogenicity within normal limits. No hydronephrosis visualized. 2.5 cm simple cyst.  1.5 cm complex cyst with thin septations, similar findings noted on multiple prior exams. This cyst is most likely benign. Left Kidney: Length: 11.4 cm. Echogenicity within normal limits. No mass or hydronephrosis visualized. Bladder: Appears normal for degree of  bladder distention. Incidental note was made of splenic calcification. This is stable. IMPRESSION: 1. 2.5 cm simple cyst right kidney. 1.5 cm septated cyst right kidney, similar findings noted on prior exams. This cyst is most likely benign. 2. No acute abnormality identified. No hydronephrosis. Electronically Signed   By: Marcello Moores  Register   On: 09/23/2016 12:25     CBC  Recent Labs Lab 09/21/16 1009  WBC 6.6  HGB 12.3*  HCT 34.8*  PLT 152  MCV 84.0  MCH 29.6  MCHC 35.3  RDW 14.1    Chemistries   Recent Labs Lab 09/21/16 1009 09/22/16 0311 09/23/16 0454  NA 139 138 134*  K 3.6 4.1 3.9  CL 106 104 101  CO2 27 27 27   GLUCOSE 120* 120* 103*  BUN 34* 34* 35*  CREATININE 4.13* 3.86* 4.30*  CALCIUM 8.4* 8.3* 7.8*   ------------------------------------------------------------------------------------------------------------------ estimated creatinine clearance is 25.7 mL/min (A) (by C-G formula based on SCr of 4.3 mg/dL (H)). ------------------------------------------------------------------------------------------------------------------ No results for input(s): HGBA1C in the last 72 hours. ------------------------------------------------------------------------------------------------------------------  Recent Labs  09/23/16 0454  CHOL 148  HDL 35*  LDLCALC 91  TRIG 112  CHOLHDL 4.2   ------------------------------------------------------------------------------------------------------------------ No results for input(s): TSH, T4TOTAL, T3FREE, THYROIDAB in the last 72 hours.  Invalid input(s):  FREET3 ------------------------------------------------------------------------------------------------------------------ No results for input(s): VITAMINB12, FOLATE, FERRITIN, TIBC, IRON, RETICCTPCT in the last 72 hours.  Coagulation profile No results for input(s): INR, PROTIME in the last 168 hours.  No results for input(s): DDIMER in the last 72 hours.  Cardiac Enzymes  Recent Labs Lab 09/21/16 1009 09/21/16 1334  TROPONINI 0.03* 0.03*   ------------------------------------------------------------------------------------------------------------------ Invalid input(s): POCBNP    Assessment & Plan   * Hypertensive urgency There is concern for medical noncompliance bp improved with current regimen Continued therapy with amlodipine, hydralazine, Imdur, labetalol    * AKI over CKD4 Renal function again worse today. I have asked nephrology to see renal ultrasound shows a cyst  * Headache. Due to accelerated hypertension now improved  * Chest pain, musculoskeletal. Troponin 3negative. Monitor on telemetry. Patient takes oxycodone and OxyContin at home. Continue these.  * Depression. Continue home medications.  * DVT prophylaxis with heparin      Code Status Orders        Start     Ordered   09/21/16 1519  Full code  Continuous     09/21/16 1519    Code Status History    Date Active Date Inactive Code Status Order ID Comments User Context   04/28/2016 11:25 PM 04/29/2016  8:43 PM Full Code [id number]  Abigail Butts, PA-C ED   01/18/2016  2:02 AM 01/18/2016  6:48 PM Full Code [id number]  Quintella Baton, MD Inpatient           Consults  None DVT Prophylaxis  Heparin   Lab Results  Component Value Date   PLT 152 09/21/2016     Time Spent in minutes   65min  Greater than 50% of time spent in care coordination and counseling patient regarding the condition and plan of care.   Dustin Flock M.D on 09/23/2016 at 2:47 PM  Between 7am to  6pm - Pager - [phone]  After 6pm go to [email or web address] - password [email or web address] [facility]   Office  [phone]

## 2016-09-23 NOTE — Consult Note (Signed)
[facility] CONSULT NOTE    Date: 09/23/2016                  Patient Name:  [name]  MRN: [id number]  DOB: 03/16/1956  Age / Sex: 61 y.o., male         PCP: No PCP Per Patient                 Service Requesting Consult: Hospitalist                 Reason for Consult: Acute renal failure/CKD stage III            History of Present Illness: Patient is a 61 y.o. male with a PMHx of depression, hypertension, pulmonary embolism, chronic kidney disease stage IV, who was admitted to [facility] on 09/21/2016 for evaluation of chest pain.  Upon initial presentation he was found to have severe hypertension with a systolic blood pressure greater than 200.  He was subsequently given hydralazine and blood pressure did improve. Patient has known chronic kidney disease stage IV.  His baseline creatinine is 2.9 from 06/20/16.  This corresponds to an EGFR of 26.  He was advised that he needed to see a nephrologist but did not have a chance to do so.  Upon presentation his creatinine was 3.8 and now up to 4.3 likely secondary to diminished renal perfusion from improved blood pressure control.   Medications: Outpatient medications: Prescriptions Prior to Admission  Medication Sig Dispense Refill Last Dose  . amLODipine (NORVASC) 10 MG tablet Take 10 mg by mouth daily.  11 09/21/2016 at am  . aspirin EC 81 MG tablet Take 81 mg by mouth daily.   09/20/2016 at am  . atorvastatin (LIPITOR) 40 MG tablet Take 40 mg by mouth daily.  0 09/20/2016 at pm  . carvedilol (COREG) 6.25 MG tablet Take 6.25 mg by mouth 2 (two) times daily.   09/21/2016 at am  . clopidogrel (PLAVIX) 75 MG tablet Take 75 mg by mouth daily.   Past Month at am  . hydrALAZINE (APRESOLINE) 100 MG tablet Take 1 tablet (100 mg total) by mouth 3 (three) times daily. (Patient taking differently: Take 100 mg by mouth 2 (two) times daily. ) 90 tablet 0 09/21/2016 at am  . oxyCODONE (OXYCONTIN) 20 mg 12 hr tablet Take 20 mg by  mouth every 12 (twelve) hours.   09/20/2016 at prn  . Oxycodone HCl 10 MG TABS Take 10-20 mg by mouth every 4 (four) hours as needed (for pain).    09/21/2016 at am  . polyethylene glycol (MIRALAX / GLYCOLAX) packet Take 17 g by mouth daily as needed for mild constipation.   0 prn at prn  . potassium chloride (K-DUR) 10 MEQ tablet Take 1 tablet (10 mEq total) by mouth daily. 30 tablet 1 09/21/2016 at am  . sucralfate (CARAFATE) 1 g tablet Take 1 tablet (1 g total) by mouth 4 (four) times daily. 120 tablet 1 09/21/2016 at am  . tamsulosin (FLOMAX) 0.4 MG CAPS capsule Take 0.4 mg by mouth daily as needed (for urinary).   5 09/21/2016 at am  . traZODone (DESYREL) 100 MG tablet Take 100 mg by mouth at bedtime as needed for sleep.    09/20/2016 at qhs  . famotidine (PEPCID) 20 MG tablet Take 1 tablet (20 mg total) by mouth 2 (two) times daily. (Patient not taking: Reported on 09/21/2016) 60 tablet 0 Not Taking at Unknown time  .  hydrochlorothiazide (HYDRODIURIL) 25 MG tablet Take 25 mg by mouth daily.  11 Not Taking at Unknown time    Current medications: Current Facility-Administered Medications  Medication Dose Route Frequency Provider Last Rate Last Dose  . acetaminophen (TYLENOL) tablet 650 mg  650 mg Oral Q6H PRN Hillary Bow, MD   650 mg at 09/22/16 0534   Or  . acetaminophen (TYLENOL) suppository 650 mg  650 mg Rectal Q6H PRN Srikar Sudini, MD      . albuterol (PROVENTIL) (2.5 MG/3ML) 0.083% nebulizer solution 2.5 mg  2.5 mg Nebulization Q2H PRN Srikar Sudini, MD      . amLODipine (NORVASC) tablet 10 mg  10 mg Oral Daily Hillary Bow, MD   10 mg at 09/23/16 0908  . aspirin EC tablet 81 mg  81 mg Oral Daily Hillary Bow, MD   81 mg at 09/23/16 0909  . atorvastatin (LIPITOR) tablet 40 mg  40 mg Oral Daily Hillary Bow, MD   40 mg at 09/23/16 0908  . butalbital-acetaminophen-caffeine (FIORICET, ESGIC) 50-325-40 MG per tablet 1 tablet  1 tablet Oral Q4H PRN Hillary Bow, MD   1 tablet at 09/21/16  2213  . clopidogrel (PLAVIX) tablet 75 mg  75 mg Oral Daily Hillary Bow, MD   75 mg at 09/23/16 0908  . heparin injection 5,000 Units  5,000 Units Subcutaneous Q8H Srikar Sudini, MD   5,000 Units at 09/23/16 1325  . hydrALAZINE (APRESOLINE) tablet 100 mg  100 mg Oral TID Hillary Bow, MD   100 mg at 09/23/16 0908  . isosorbide mononitrate (IMDUR) 24 hr tablet 60 mg  60 mg Oral Daily Hillary Bow, MD   60 mg at 09/23/16 0909  . labetalol (NORMODYNE) tablet 300 mg  300 mg Oral TID Dustin Flock, MD   300 mg at 09/23/16 0908  . labetalol (NORMODYNE,TRANDATE) injection 20 mg  20 mg Intravenous Q4H PRN Hillary Bow, MD   20 mg at 09/21/16 2002  . ondansetron (ZOFRAN) tablet 4 mg  4 mg Oral Q6H PRN Hillary Bow, MD       Or  . ondansetron (ZOFRAN) injection 4 mg  4 mg Intravenous Q6H PRN Srikar Sudini, MD      . oxyCODONE (Oxy IR/ROXICODONE) immediate release tablet 10 mg  10 mg Oral Q4H PRN Hillary Bow, MD   10 mg at 09/23/16 1216  . oxyCODONE (OXYCONTIN) 12 hr tablet 20 mg  20 mg Oral Q12H Srikar Sudini, MD   20 mg at 09/23/16 0908  . polyethylene glycol (MIRALAX / GLYCOLAX) packet 17 g  17 g Oral Daily PRN Hillary Bow, MD      . senna (SENOKOT) tablet 8.6 mg  1 tablet Oral BID Hillary Bow, MD   8.6 mg at 09/23/16 0909  . sodium chloride flush (NS) 0.9 % injection 3 mL  3 mL Intravenous Q12H Hillary Bow, MD   3 mL at 09/23/16 0909  . sucralfate (CARAFATE) tablet 1 g  1 g Oral TID AC & HS Srikar Sudini, MD   1 g at 09/23/16 1110  . tamsulosin (FLOMAX) capsule 0.4 mg  0.4 mg Oral QPC breakfast Hillary Bow, MD   0.4 mg at 09/23/16 0755  . terazosin (HYTRIN) capsule 5 mg  5 mg Oral QHS Dustin Flock, MD      . traZODone (DESYREL) tablet 100 mg  100 mg Oral QHS PRN Hillary Bow, MD   100 mg at 09/23/16 0055      Allergies: Allergies  Allergen Reactions  .  Cyclobenzaprine Nausea Only  . Clonidine Other (See Comments)    Asymptomatic bradycardia to 38  . Furosemide Other (See  Comments)    Caused renal failure  . Tylenol [Acetaminophen] Other (See Comments)    Reaction:  Pt states it bothers his liver      Past Medical History: Past Medical History:  Diagnosis Date  . Depression   . Heart attack   . Hypertension   . PE (pulmonary embolism)   . Renal disorder      Past Surgical History: Past Surgical History:  Procedure Laterality Date  . TOTAL HIP ARTHROPLASTY       Family History: No family history on file.   Social History: Social History   Social History  . Marital status: Divorced    Spouse name: N/A  . Number of children: N/A  . Years of education: N/A   Occupational History  . Not on file.   Social History Main Topics  . Smoking status: Current Every Day Smoker    Packs/day: 0.50    Types: Cigarettes  . Smokeless tobacco: Never Used  . Alcohol use No  . Drug use: No  . Sexual activity: Not on file   Other Topics Concern  . Not on file   Social History Narrative  . No narrative on file     Review of Systems: Review of Systems  Constitutional: Negative for chills, fever and weight loss.  HENT: Negative for congestion, hearing loss and nosebleeds.   Eyes: Negative for blurred vision and double vision.  Respiratory: Negative for cough, hemoptysis and sputum production.   Cardiovascular: Positive for chest pain and leg swelling. Negative for orthopnea.  Gastrointestinal: Negative for heartburn, nausea and vomiting.  Genitourinary: Negative for flank pain and urgency.  Musculoskeletal: Negative for joint pain and myalgias.  Skin: Negative for itching and rash.  Neurological: Negative for dizziness and focal weakness.  Endo/Heme/Allergies: Negative for polydipsia. Does not bruise/bleed easily.  Psychiatric/Behavioral: Negative for substance abuse. The patient is not nervous/anxious.      Vital Signs: Blood pressure 125/77, pulse 64, temperature 97.7 F (36.5 C), temperature source Oral, resp. rate 18, height 6'  (1.829 m), weight 126.4 kg (278 lb 9.6 oz), SpO2 96 %.  Weight trends: Filed Weights   09/21/16 1003 09/22/16 0413 09/23/16 0510  Weight: 124.7 kg (275 lb) 121.9 kg (268 lb 12.8 oz) 126.4 kg (278 lb 9.6 oz)    Physical Exam: General: NAD, resting in bed comfortably.  Head: Normocephalic, atraumatic.  Eyes: Anicteric, EOMI  Nose: Mucous membranes moist, not inflammed, nonerythematous.  Throat: Oropharynx nonerythematous, no exudate appreciated.   Neck: Supple, trachea midline.  Lungs:  Normal respiratory effort. Clear to auscultation BL without crackles or wheezes.  Heart: RRR. S1 and S2 normal without gallop, murmur, or rubs.  Abdomen:  BS normoactive. Soft, Nondistended, non-tender.  No masses or organomegaly.  Extremities: 1+pretibial edema.  Neurologic: A&O X3, Motor strength is 5/5 in the all 4 extremities  Skin: No visible rashes, scars.    Lab results: Basic Metabolic Panel:  Recent Labs Lab 09/21/16 1009 09/22/16 0311 09/23/16 0454  NA 139 138 134*  K 3.6 4.1 3.9  CL 106 104 101  CO2 27 27 27   GLUCOSE 120* 120* 103*  BUN 34* 34* 35*  CREATININE 4.13* 3.86* 4.30*  CALCIUM 8.4* 8.3* 7.8*  PHOS  --   --  4.4    Liver Function Tests:  Recent Labs Lab 09/23/16 0454  ALBUMIN 2.7*  No results for input(s): LIPASE, AMYLASE in the last 168 hours. No results for input(s): AMMONIA in the last 168 hours.  CBC:  Recent Labs Lab 09/21/16 1009  WBC 6.6  HGB 12.3*  HCT 34.8*  MCV 84.0  PLT 152    Cardiac Enzymes:  Recent Labs Lab 09/21/16 1009 09/21/16 1334  TROPONINI 0.03* 0.03*    BNP: Invalid input(s): POCBNP  CBG: No results for input(s): GLUCAP in the last 168 hours.  Microbiology: Results for orders placed or performed during the hospital encounter of 02/25/16  Urine culture     Status: Abnormal   Collection Time: 02/25/16  7:43 PM  Result Value Ref Range Status   Specimen Description URINE, RANDOM  Final   Special Requests NONE   Final   Culture MULTIPLE SPECIES PRESENT, SUGGEST RECOLLECTION (A)  Final   Report Status 02/27/2016 FINAL  Final    Coagulation Studies: No results for input(s): LABPROT, INR in the last 72 hours.  Urinalysis: No results for input(s): COLORURINE, LABSPEC, PHURINE, GLUCOSEU, HGBUR, BILIRUBINUR, KETONESUR, PROTEINUR, UROBILINOGEN, NITRITE, LEUKOCYTESUR in the last 72 hours.  Invalid input(s): APPERANCEUR    Imaging: US Renal  Result Date: 09/23/2016 CLINICAL DATA:  Acute renal failure. EXAM: RENAL / URINARY TRACT ULTRASOUND COMPLETE COMPARISON:  CT 06/23/2016, 10/19/2014, 01/01/2014. FINDINGS: Right Kidney: Length: 10.8 cm. Echogenicity within normal limits. No hydronephrosis visualized. 2.5 cm simple cyst. 1.5 cm complex cyst with thin septations, similar findings noted on multiple prior exams. This cyst is most likely benign. Left Kidney: Length: 11.4 cm. Echogenicity within normal limits. No mass or hydronephrosis visualized. Bladder: Appears normal for degree of bladder distention. Incidental note was made of splenic calcification. This is stable. IMPRESSION: 1. 2.5 cm simple cyst right kidney. 1.5 cm septated cyst right kidney, similar findings noted on prior exams. This cyst is most likely benign. 2. No acute abnormality identified. No hydronephrosis. Electronically Signed   By: Marcello Moores  Register   On: 09/23/2016 12:25      Assessment & Plan: Pt is a 61 y.o. male  with a PMHx of depression, hypertension, pulmonary embolism, chronic kidney disease stage IV, who was admitted to [facility] on 09/21/2016 for evaluation of chest pain.  Upon initial presentation he was found to have severe hypertension with a systolic blood pressure greater than 200.   1.  Acute renal failure likely due to alterations in renal hemodynamics with blood pressure control. 2.  CKD stage IV. 3.  Hypertension. 4.  Proteinuria.   5.  Anemia of CKD, hgb 12.3.   Plan:  The patient has known advanced chronic kidney  disease stage IV with most recent EGFR of 26 as an outpatient.  He now presents with acute renal failure in the setting of severe hypertension.  I suspect that the acute worsening in renal function now is secondary to alterations in his renal hemodynamics.  Often it is noted that once blood pressure comes under better control renal blood flow can decresince the kidney was used to much higher pressures.  Hopefully his renal functionand renal blood flowwill improve.  We will proceed with additional workup including renal ultrasound, SPEP, UPEP, ANA, urine protein to creatinine ratio, PTH, and phosphorus.  He will certainly need very close followup of his underlying chronic kidney disease as he is high risk for progression to end-stage renal disease.  No indication for Procrit at the moment given his relatively mild anemia.  Thanks for consultation.

## 2016-09-24 LAB — C4 COMPLEMENT: COMPLEMENT C4, BODY FLUID: 28 mg/dL (ref 14–44)

## 2016-09-24 LAB — RENAL FUNCTION PANEL
Albumin: 2.8 g/dL — ABNORMAL LOW (ref 3.5–5.0)
Anion gap: 6 (ref 5–15)
BUN: 34 mg/dL — ABNORMAL HIGH (ref 6–20)
CHLORIDE: 103 mmol/L (ref 101–111)
CO2: 27 mmol/L (ref 22–32)
Calcium: 8.1 mg/dL — ABNORMAL LOW (ref 8.9–10.3)
Creatinine, Ser: 4.56 mg/dL — ABNORMAL HIGH (ref 0.61–1.24)
GFR, EST AFRICAN AMERICAN: 15 mL/min — AB (ref >60)
GFR, EST NON AFRICAN AMERICAN: 13 mL/min — AB (ref >60)
Glucose, Bld: 84 mg/dL (ref 65–99)
POTASSIUM: 3.8 mmol/L (ref 3.5–5.1)
Phosphorus: 4 mg/dL (ref 2.5–4.6)
Sodium: 136 mmol/L (ref 135–145)

## 2016-09-24 LAB — PARATHYROID HORMONE, INTACT (NO CA): PTH: 143 pg/mL — ABNORMAL HIGH (ref 15–65)

## 2016-09-24 LAB — MPO/PR-3 (ANCA) ANTIBODIES

## 2016-09-24 LAB — C3 COMPLEMENT: C3 Complement: 112 mg/dL (ref 82–167)

## 2016-09-24 LAB — ANA W/REFLEX IF POSITIVE: ANA: NEGATIVE

## 2016-09-24 MED ORDER — OXYCODONE HCL 5 MG PO TABS
10.0000 mg | ORAL_TABLET | Freq: Four times a day (QID) | ORAL | Status: DC | PRN
  Administered 2016-09-24 – 2016-09-27 (×11): 10 mg via ORAL
  Filled 2016-09-24 (×11): qty 2

## 2016-09-24 MED ORDER — SODIUM CHLORIDE 0.9 % IV SOLN
INTRAVENOUS | Status: DC
  Administered 2016-09-24 – 2016-09-26 (×3): via INTRAVENOUS

## 2016-09-24 NOTE — Progress Notes (Signed)
[facility] [facility] [place] NAME: [name]    MR#:  [id number]  DATE OF BIRTH:  19-May-1956  SUBJECTIVE:  CHIEF COMPLAINT:   Chief Complaint  Patient presents with  . Chest Pain   - complains of chronic pain, appears very comfortable - Not Following with outpatient pain management clinic. -Continues to make urine. Renal function is still worse.  REVIEW OF SYSTEMS:  Review of Systems  Constitutional: Negative for chills, fever and malaise/fatigue.  HENT: Negative for congestion, ear discharge, hearing loss and nosebleeds.   Eyes: Negative for blurred vision and double vision.  Respiratory: Negative for cough, shortness of breath and wheezing.   Cardiovascular: Negative for chest pain, palpitations and leg swelling.  Gastrointestinal: Negative for abdominal pain, constipation, diarrhea, nausea and vomiting.  Genitourinary: Negative for dysuria.  Musculoskeletal: Positive for back pain and myalgias.  Neurological: Negative for dizziness, sensory change, speech change, focal weakness, seizures and headaches.  Psychiatric/Behavioral: Negative for depression.    DRUG ALLERGIES:   Allergies  Allergen Reactions  . Cyclobenzaprine Nausea Only  . Clonidine Other (See Comments)    Asymptomatic bradycardia to 38  . Furosemide Other (See Comments)    Caused renal failure  . Tylenol [Acetaminophen] Other (See Comments)    Reaction:  Pt states it bothers his liver    VITALS:  Blood pressure 137/90, pulse 72, temperature 98.2 F (36.8 C), temperature source Oral, resp. rate 18, height 6' (1.829 m), weight 125.6 kg (276 lb 14.4 oz), SpO2 96 %.  PHYSICAL EXAMINATION:  Physical Exam  GENERAL:  61 y.o.-year-old patient lying in the bed with no acute distress.  EYES: Pupils equal, round, reactive to light and accommodation. No scleral icterus. Extraocular muscles intact.  HEENT: Head atraumatic, normocephalic. Oropharynx and nasopharynx  clear.  NECK:  Supple, no jugular venous distention. No thyroid enlargement, no tenderness.  LUNGS: Normal breath sounds bilaterally, no wheezing, rales,rhonchi or crepitation. No use of accessory muscles of respiration.  CARDIOVASCULAR: S1, S2 normal. No murmurs, rubs, or gallops.  ABDOMEN: Soft, nontender, nondistended. Bowel sounds present. No organomegaly or mass.  EXTREMITIES: No pedal edema, cyanosis, or clubbing. 2+ pedal pulses palpated bilaterally NEUROLOGIC: Cranial nerves II through XII are intact. Muscle strength 5/5 in all extremities. Sensation intact. Gait not checked.  PSYCHIATRIC: The patient is alert and oriented x 3.  SKIN: No obvious rash, lesion, or ulcer.    LABORATORY PANEL:   CBC  Recent Labs Lab 09/21/16 1009  WBC 6.6  HGB 12.3*  HCT 34.8*  PLT 152   ------------------------------------------------------------------------------------------------------------------  Chemistries   Recent Labs Lab 09/24/16 0427  NA 136  K 3.8  CL 103  CO2 27  GLUCOSE 84  BUN 34*  CREATININE 4.56*  CALCIUM 8.1*   ------------------------------------------------------------------------------------------------------------------  Cardiac Enzymes  Recent Labs Lab 09/21/16 1334  TROPONINI 0.03*   ------------------------------------------------------------------------------------------------------------------  RADIOLOGY:  US Renal  Result Date: 09/23/2016 CLINICAL DATA:  Acute renal failure. EXAM: RENAL / URINARY TRACT ULTRASOUND COMPLETE COMPARISON:  CT 06/23/2016, 10/19/2014, 01/01/2014. FINDINGS: Right Kidney: Length: 10.8 cm. Echogenicity within normal limits. No hydronephrosis visualized. 2.5 cm simple cyst. 1.5 cm complex cyst with thin septations, similar findings noted on multiple prior exams. This cyst is most likely benign. Left Kidney: Length: 11.4 cm. Echogenicity within normal limits. No mass or hydronephrosis visualized. Bladder: Appears normal for  degree of bladder distention. Incidental note was made of splenic calcification. This is stable. IMPRESSION: 1. 2.5 cm simple cyst right  kidney. 1.5 cm septated cyst right kidney, similar findings noted on prior exams. This cyst is most likely benign. 2. No acute abnormality identified. No hydronephrosis. Electronically Signed   By: Marcello Moores  Register   On: 09/23/2016 12:25    EKG:   Orders placed or performed during the hospital encounter of 09/21/16  . EKG 12-Lead  . EKG 12-Lead  . ED EKG within 10 minutes  . ED EKG within 10 minutes    ASSESSMENT AND PLAN:   61 year old male with past medical history significant for CAD, hypertension, history of pulmonary embolism, CK D stage IV on compliant with his medications and also follow-ups presents to hospital secondary to elevated blood pressure and also noted to be in acute renal failure.   #1 Hypertensive urgency-medication noncompliance. Blood pressure is well controlled. -Continue Norvasc, oral hydralazine, Imdur and labetalol  #2 acute renal failure on CKD stage IV-Baseline creatinine seems to be around 2.9 with GFR of 26. Renal function worsened up to 4.5 in the hospital. -Likely ATN from diminished renal perfusion  from improving blood pressure control -Continues to make good amounts of urine. No acute indication for dialysis yet. -Appreciate nephrology consult. Avoid nephrotoxins. Wait for renal function to improve  #3 BPH-on Flomax and Hytrin  #4 chronic pain syndrome-takes a lot of pain medications. Unable to clearly give me an answer if he has an outpatient pain management clinic orifice primary care prescribes those pain medications. Continue OxyContin twice a day. Space Out the breakthrough oxycodone at this time. Plan to wean him off the medications.  #5 DVT prophylaxis-on subcutaneous heparin   All the records are reviewed and case discussed with Care Management/Social Workerr. Management plans discussed with the patient,  family and they are in agreement.  CODE STATUS: Full code  TOTAL TIME TAKING CARE OF THIS PATIENT: 38 minutes.   POSSIBLE D/C IN 2-3 DAYS, DEPENDING ON CLINICAL CONDITION.   Gladstone Lighter M.D on 09/24/2016 at 12:19 PM  Between 7am to 6pm - Pager - [phone]  After 6pm go to [email or web address] - password [email or web address] [facility]  Office  [phone]  CC: Primary care physician; No PCP Per Patient

## 2016-09-24 NOTE — Progress Notes (Signed)
[facility]  ROUNDING NOTE   Subjective:  Renal function is a bit worse today. Creatinine up to 4.56. However patient has excellent urine output at 2.4 L over the preceding 24 hours. Renal cysts noted on ultrasound.  Objective:  Vital signs in last 24 hours:  Temp:  [97.8 F (36.6 C)-98.2 F (36.8 C)] 97.8 F (36.6 C) (03/17 1221) Pulse Rate:  [69-72] 69 (03/17 1221) Resp:  [15-18] 15 (03/17 1221) BP: (123-137)/(73-90) 124/75 (03/17 1221) SpO2:  [95 %-98 %] 96 % (03/17 1221) Weight:  [125.6 kg (276 lb 14.4 oz)] 125.6 kg (276 lb 14.4 oz) (03/17 0337)  Weight change: -0.771 kg (-1 lb 11.2 oz) Filed Weights   09/22/16 0413 09/23/16 0510 09/24/16 0337  Weight: 121.9 kg (268 lb 12.8 oz) 126.4 kg (278 lb 9.6 oz) 125.6 kg (276 lb 14.4 oz)    Intake/Output: I/O last 3 completed shifts: In: 1012.5 [P.O.:840; I.V.:172.5] Out: 3300 [Urine:3300]   Intake/Output this shift:  Total I/O In: 240 [P.O.:240] Out: 0   Physical Exam: General: No acute distress  Head: Normocephalic, atraumatic. Moist oral mucosal membranes  Eyes: Anicteric  Neck: Supple, trachea midline  Lungs:  Clear to auscultation, normal effort  Heart: S1S2 no rubs  Abdomen:  Soft, nontender  Extremities: Trace peripheral edema.  Neurologic: Nonfocal, moving all four extremities  Skin: No lesions       Basic Metabolic Panel:  Recent Labs Lab 09/21/16 1009 09/22/16 0311 09/23/16 0454 09/23/16 1830 09/24/16 0427  NA 139 138 134*  --  136  K 3.6 4.1 3.9  --  3.8  CL 106 104 101  --  103  CO2 27 27 27   --  27  GLUCOSE 120* 120* 103*  --  84  BUN 34* 34* 35*  --  34*  CREATININE 4.13* 3.86* 4.30*  --  4.56*  CALCIUM 8.4* 8.3* 7.8*  --  8.1*  PHOS  --   --  4.4 4.2 4.0    Liver Function Tests:  Recent Labs Lab 09/23/16 0454 09/24/16 0427  ALBUMIN 2.7* 2.8*   No results for input(s): LIPASE, AMYLASE in the last 168 hours. No results for input(s): AMMONIA in the last 168  hours.  CBC:  Recent Labs Lab 09/21/16 1009  WBC 6.6  HGB 12.3*  HCT 34.8*  MCV 84.0  PLT 152    Cardiac Enzymes:  Recent Labs Lab 09/21/16 1009 09/21/16 1334  TROPONINI 0.03* 0.03*    BNP: Invalid input(s): POCBNP  CBG: No results for input(s): GLUCAP in the last 168 hours.  Microbiology: Results for orders placed or performed during the hospital encounter of 02/25/16  Urine culture     Status: Abnormal   Collection Time: 02/25/16  7:43 PM  Result Value Ref Range Status   Specimen Description URINE, RANDOM  Final   Special Requests NONE  Final   Culture MULTIPLE SPECIES PRESENT, SUGGEST RECOLLECTION (A)  Final   Report Status 02/27/2016 FINAL  Final    Coagulation Studies: No results for input(s): LABPROT, INR in the last 72 hours.  Urinalysis: No results for input(s): COLORURINE, LABSPEC, PHURINE, GLUCOSEU, HGBUR, BILIRUBINUR, KETONESUR, PROTEINUR, UROBILINOGEN, NITRITE, LEUKOCYTESUR in the last 72 hours.  Invalid input(s): APPERANCEUR    Imaging: US Renal  Result Date: 09/23/2016 CLINICAL DATA:  Acute renal failure. EXAM: RENAL / URINARY TRACT ULTRASOUND COMPLETE COMPARISON:  CT 06/23/2016, 10/19/2014, 01/01/2014. FINDINGS: Right Kidney: Length: 10.8 cm. Echogenicity within normal limits. No hydronephrosis visualized. 2.5 cm simple cyst.  1.5 cm complex cyst with thin septations, similar findings noted on multiple prior exams. This cyst is most likely benign. Left Kidney: Length: 11.4 cm. Echogenicity within normal limits. No mass or hydronephrosis visualized. Bladder: Appears normal for degree of bladder distention. Incidental note was made of splenic calcification. This is stable. IMPRESSION: 1. 2.5 cm simple cyst right kidney. 1.5 cm septated cyst right kidney, similar findings noted on prior exams. This cyst is most likely benign. 2. No acute abnormality identified. No hydronephrosis. Electronically Signed   By: Marcello Moores  Register   On: 09/23/2016 12:25      Medications:    . amLODipine  10 mg Oral Daily  . aspirin EC  81 mg Oral Daily  . atorvastatin  40 mg Oral Daily  . clopidogrel  75 mg Oral Daily  . heparin subcutaneous  5,000 Units Subcutaneous Q8H  . hydrALAZINE  100 mg Oral TID  . isosorbide mononitrate  60 mg Oral Daily  . labetalol  300 mg Oral TID  . oxyCODONE  20 mg Oral Q12H  . senna  1 tablet Oral BID  . sodium chloride flush  3 mL Intravenous Q12H  . sucralfate  1 g Oral TID AC & HS  . tamsulosin  0.4 mg Oral QPC breakfast  . terazosin  5 mg Oral QHS   acetaminophen **OR** acetaminophen, albuterol, butalbital-acetaminophen-caffeine, labetalol, ondansetron **OR** ondansetron (ZOFRAN) IV, oxyCODONE, polyethylene glycol, traZODone  Assessment/ Plan:  61 y.o. male with a PMHx of depression, hypertension, pulmonary embolism, chronic kidney disease stage IV, who was admitted to [facility] on 09/21/2016 for evaluation of chest pain.  Upon initial presentation he was found to have severe hypertension with a systolic blood pressure greater than 200.   1.  Acute renal failure likely due to alterations in renal hemodynamics with blood pressure control/CKD stage IV.  Creatinine slightly worse today at 4.56.  We will start the patient on gentle IV fluid hydration with 0.9 normal saline at 50 cc per hour. No urgent indication for dialysis at the moment.  2.  Hypertension.  Blood pressure currently well controlled.  Continue amlodipine, hydralazine, Imdur, labetalol, and terzosin.  3.  Proteinuria.  Hold off on ACEi/ARB for now given ARF.   4.  Anemia of CKD, hgb 12.3.  Continue to periodically monitor hgb.   5.  1.5cm septated cyst right kidney:  Stable on multiple imaging studies. Consider outpatient urology referral.   LOS: 1 [name] 3/17/20184:38 PM

## 2016-09-25 LAB — BASIC METABOLIC PANEL
Anion gap: 4 — ABNORMAL LOW (ref 5–15)
BUN: 35 mg/dL — ABNORMAL HIGH (ref 6–20)
CHLORIDE: 106 mmol/L (ref 101–111)
CO2: 27 mmol/L (ref 22–32)
Calcium: 8.1 mg/dL — ABNORMAL LOW (ref 8.9–10.3)
Creatinine, Ser: 4.43 mg/dL — ABNORMAL HIGH (ref 0.61–1.24)
GFR calc non Af Amer: 13 mL/min — ABNORMAL LOW (ref >60)
GFR, EST AFRICAN AMERICAN: 16 mL/min — AB (ref >60)
Glucose, Bld: 89 mg/dL (ref 65–99)
POTASSIUM: 4 mmol/L (ref 3.5–5.1)
SODIUM: 137 mmol/L (ref 135–145)

## 2016-09-25 NOTE — Plan of Care (Signed)
Problem: Pain Managment: Goal: General experience of comfort will improve Outcome: Completed/Met Date Met: 09/25/16 Chronic back and hip pain.  Will not be resolved during this admission

## 2016-09-25 NOTE — Progress Notes (Signed)
[name] at [place] NAME: [name]    MR#:  [id number]  DATE OF BIRTH:  April 04, 1956  SUBJECTIVE:  CHIEF COMPLAINT:   Chief Complaint  Patient presents with  . Chest Pain   - renal function about the same, urine output is good - BP improved  REVIEW OF SYSTEMS:  Review of Systems  Constitutional: Negative for chills, fever and malaise/fatigue.  HENT: Negative for congestion, ear discharge, hearing loss and nosebleeds.   Eyes: Negative for blurred vision and double vision.  Respiratory: Negative for cough, shortness of breath and wheezing.   Cardiovascular: Negative for chest pain, palpitations and leg swelling.  Gastrointestinal: Negative for abdominal pain, constipation, diarrhea, nausea and vomiting.  Genitourinary: Negative for dysuria.  Musculoskeletal: Positive for back pain and myalgias.  Neurological: Negative for dizziness, sensory change, speech change, focal weakness, seizures and headaches.  Psychiatric/Behavioral: Negative for depression.    DRUG ALLERGIES:   Allergies  Allergen Reactions  . Cyclobenzaprine Nausea Only  . Clonidine Other (See Comments)    Asymptomatic bradycardia to 38  . Furosemide Other (See Comments)    Caused renal failure  . Tylenol [Acetaminophen] Other (See Comments)    Reaction:  Pt states it bothers his liver    VITALS:  Blood pressure 137/87, pulse 63, temperature 98.1 F (36.7 C), temperature source Oral, resp. rate 17, height 6' (1.829 m), weight 127.1 kg (280 lb 3.2 oz), SpO2 93 %.  PHYSICAL EXAMINATION:  Physical Exam  GENERAL:  61 y.o.-year-old patient lying in the bed with no acute distress.  EYES: Pupils equal, round, reactive to light and accommodation. No scleral icterus. Extraocular muscles intact.  HEENT: Head atraumatic, normocephalic. Oropharynx and nasopharynx clear.  NECK:  Supple, no jugular venous distention. No thyroid enlargement, no tenderness.  LUNGS:  Normal breath sounds bilaterally, no wheezing, rales,rhonchi or crepitation. No use of accessory muscles of respiration.  CARDIOVASCULAR: S1, S2 normal. No murmurs, rubs, or gallops.  ABDOMEN: Soft, nontender, nondistended. Bowel sounds present. No organomegaly or mass.  EXTREMITIES: No pedal edema, cyanosis, or clubbing. 2+ pedal pulses palpated bilaterally NEUROLOGIC: Cranial nerves II through XII are intact. Muscle strength 5/5 in all extremities. Sensation intact. Gait not checked.  PSYCHIATRIC: The patient is alert and oriented x 3.  SKIN: No obvious rash, lesion, or ulcer.    LABORATORY PANEL:   CBC  Recent Labs Lab 09/21/16 1009  WBC 6.6  HGB 12.3*  HCT 34.8*  PLT 152   ------------------------------------------------------------------------------------------------------------------  Chemistries   Recent Labs Lab 09/25/16 0432  NA 137  K 4.0  CL 106  CO2 27  GLUCOSE 89  BUN 35*  CREATININE 4.43*  CALCIUM 8.1*   ------------------------------------------------------------------------------------------------------------------  Cardiac Enzymes  Recent Labs Lab 09/21/16 1334  TROPONINI 0.03*   ------------------------------------------------------------------------------------------------------------------  RADIOLOGY:  US Renal  Result Date: 09/23/2016 CLINICAL DATA:  Acute renal failure. EXAM: RENAL / URINARY TRACT ULTRASOUND COMPLETE COMPARISON:  CT 06/23/2016, 10/19/2014, 01/01/2014. FINDINGS: Right Kidney: Length: 10.8 cm. Echogenicity within normal limits. No hydronephrosis visualized. 2.5 cm simple cyst. 1.5 cm complex cyst with thin septations, similar findings noted on multiple prior exams. This cyst is most likely benign. Left Kidney: Length: 11.4 cm. Echogenicity within normal limits. No mass or hydronephrosis visualized. Bladder: Appears normal for degree of bladder distention. Incidental note was made of splenic calcification. This is stable.  IMPRESSION: 1. 2.5 cm simple cyst right kidney. 1.5 cm septated cyst right kidney, similar findings noted on prior  exams. This cyst is most likely benign. 2. No acute abnormality identified. No hydronephrosis. Electronically Signed   By: Marcello Moores  Register   On: 09/23/2016 12:25    EKG:   Orders placed or performed during the hospital encounter of 09/21/16  . EKG 12-Lead  . EKG 12-Lead  . ED EKG within 10 minutes  . ED EKG within 10 minutes    ASSESSMENT AND PLAN:   61 year old male with past medical history significant for CAD, hypertension, history of pulmonary embolism, CK D stage IV on compliant with his medications and also follow-ups presents to hospital secondary to elevated blood pressure and also noted to be in acute renal failure.   #1 Hypertensive urgency-medication noncompliance. Blood pressure is well controlled. -Continue Norvasc, oral hydralazine, Imdur and labetalol  #2 acute renal failure on CKD stage IV-Baseline creatinine seems to be around 2.9 with GFR of 26. Renal function worsened up to 4.5 in the hospital. -Likely ATN from diminished renal perfusion  from improving blood pressure control -Continues to make good amounts of urine. No acute indication for dialysis yet. -Appreciate nephrology consult. Avoid nephrotoxins. Wait for renal function to improve- stable at this time  #3 BPH-on Flomax and Hytrin  #4 chronic pain syndrome-takes a lot of pain medications.  - Unable to clearly give me an answer if he has an outpatient pain management clinic or his primary care prescribes those pain medications. Continue OxyContin twice a day.  -Space Out the breakthrough oxycodone at this time.  #5 DVT prophylaxis-on subcutaneous heparin   All the records are reviewed and case discussed with Care Management/Social Workerr. Management plans discussed with the patient, family and they are in agreement.  CODE STATUS: Full code  TOTAL TIME TAKING CARE OF THIS PATIENT: 38  minutes.   POSSIBLE D/C IN 2-3 DAYS, DEPENDING ON CLINICAL CONDITION.   Gladstone Lighter M.D on 09/25/2016 at 11:04 AM  Between 7am to 6pm - Pager - [phone]  After 6pm go to [email or web address] - password [email or web address] [facility]  Office  [phone]  CC: Primary care physician; No PCP Per Patient

## 2016-09-25 NOTE — Progress Notes (Signed)
[facility]  ROUNDING NOTE   Subjective:  Pt seen at bedside.  Cr down to 4.4.  Blood pressure well controlled.   Objective:  Vital signs in last 24 hours:  Temp:  [97.7 F (36.5 C)-98.9 F (37.2 C)] 97.7 F (36.5 C) (03/18 1655) Pulse Rate:  [63-69] 67 (03/18 1655) Resp:  [14-18] 17 (03/18 1655) BP: (125-137)/(81-87) 127/81 (03/18 1655) SpO2:  [93 %-98 %] 98 % (03/18 1655) Weight:  [127.1 kg (280 lb 3.2 oz)] 127.1 kg (280 lb 3.2 oz) (03/18 0428)  Weight change: 1.497 kg (3 lb 4.8 oz) Filed Weights   09/23/16 0510 09/24/16 0337 09/25/16 0428  Weight: 126.4 kg (278 lb 9.6 oz) 125.6 kg (276 lb 14.4 oz) 127.1 kg (280 lb 3.2 oz)    Intake/Output: I/O last 3 completed shifts: In: 988.3 [P.O.:960; I.V.:28.3] Out: 4000 [Urine:4000]   Intake/Output this shift:  No intake/output data recorded.  Physical Exam: General: No acute distress  Head: Normocephalic, atraumatic. Moist oral mucosal membranes  Eyes: Anicteric  Neck: Supple, trachea midline  Lungs:  Clear to auscultation, normal effort  Heart: S1S2 no rubs  Abdomen:  Soft, nontender  Extremities: Trace peripheral edema.  Neurologic: Nonfocal, moving all four extremities  Skin: No lesions       Basic Metabolic Panel:  Recent Labs Lab 09/21/16 1009 09/22/16 0311 09/23/16 0454 09/23/16 1830 09/24/16 0427 09/25/16 0432  NA 139 138 134*  --  136 137  K 3.6 4.1 3.9  --  3.8 4.0  CL 106 104 101  --  103 106  CO2 27 27 27   --  27 27  GLUCOSE 120* 120* 103*  --  84 89  BUN 34* 34* 35*  --  34* 35*  CREATININE 4.13* 3.86* 4.30*  --  4.56* 4.43*  CALCIUM 8.4* 8.3* 7.8*  --  8.1* 8.1*  PHOS  --   --  4.4 4.2 4.0  --     Liver Function Tests:  Recent Labs Lab 09/23/16 0454 09/24/16 0427  ALBUMIN 2.7* 2.8*   No results for input(s): LIPASE, AMYLASE in the last 168 hours. No results for input(s): AMMONIA in the last 168 hours.  CBC:  Recent Labs Lab 09/21/16 1009  WBC 6.6  HGB 12.3*   HCT 34.8*  MCV 84.0  PLT 152    Cardiac Enzymes:  Recent Labs Lab 09/21/16 1009 09/21/16 1334  TROPONINI 0.03* 0.03*    BNP: Invalid input(s): POCBNP  CBG: No results for input(s): GLUCAP in the last 168 hours.  Microbiology: Results for orders placed or performed during the hospital encounter of 02/25/16  Urine culture     Status: Abnormal   Collection Time: 02/25/16  7:43 PM  Result Value Ref Range Status   Specimen Description URINE, RANDOM  Final   Special Requests NONE  Final   Culture MULTIPLE SPECIES PRESENT, SUGGEST RECOLLECTION (A)  Final   Report Status 02/27/2016 FINAL  Final    Coagulation Studies: No results for input(s): LABPROT, INR in the last 72 hours.  Urinalysis: No results for input(s): COLORURINE, LABSPEC, PHURINE, GLUCOSEU, HGBUR, BILIRUBINUR, KETONESUR, PROTEINUR, UROBILINOGEN, NITRITE, LEUKOCYTESUR in the last 72 hours.  Invalid input(s): APPERANCEUR    Imaging: No results found.   Medications:   . sodium chloride 50 mL/hr at 09/25/16 1212   . amLODipine  10 mg Oral Daily  . aspirin EC  81 mg Oral Daily  . atorvastatin  40 mg Oral Daily  . clopidogrel  75 mg  Oral Daily  . heparin subcutaneous  5,000 Units Subcutaneous Q8H  . hydrALAZINE  100 mg Oral TID  . isosorbide mononitrate  60 mg Oral Daily  . labetalol  300 mg Oral TID  . oxyCODONE  20 mg Oral Q12H  . senna  1 tablet Oral BID  . sodium chloride flush  3 mL Intravenous Q12H  . sucralfate  1 g Oral TID AC & HS  . tamsulosin  0.4 mg Oral QPC breakfast  . terazosin  5 mg Oral QHS   acetaminophen **OR** acetaminophen, albuterol, butalbital-acetaminophen-caffeine, labetalol, ondansetron **OR** ondansetron (ZOFRAN) IV, oxyCODONE, polyethylene glycol, traZODone  Assessment/ Plan:  61 y.o. male with a PMHx of depression, hypertension, pulmonary embolism, chronic kidney disease stage IV, who was admitted to [facility] on 09/21/2016 for evaluation of chest pain.  Upon initial  presentation he was found to have severe hypertension with a systolic blood pressure greater than 200.   1.  Acute renal failure likely due to alterations in renal hemodynamics with blood pressure control/CKD stage IV.   - Renal function slightly improved.  Will continue gentle IV hydration for now.   2.  Hypertension.  Blood pressure 127/81.  Continue amlodipine, hydralazine, Imdur, labetalol, and terzosin.  3.  Proteinuria. Will need ACE/ARB eventually.  Hold off for now given ARF.   4.  Anemia of CKD.  hgb slightly low at 12.3, no indication for epogen, will follow CBC.   5.  1.5cm septated cyst right kidney:  Stable on multiple imaging studies. Consider outpatient urology referral.   LOS: 2 Tameisha Covell 3/18/20188:13 PM

## 2016-09-26 LAB — BASIC METABOLIC PANEL
Anion gap: 7 (ref 5–15)
BUN: 34 mg/dL — AB (ref 6–20)
CHLORIDE: 105 mmol/L (ref 101–111)
CO2: 24 mmol/L (ref 22–32)
CREATININE: 4.28 mg/dL — AB (ref 0.61–1.24)
Calcium: 8.5 mg/dL — ABNORMAL LOW (ref 8.9–10.3)
GFR calc Af Amer: 16 mL/min — ABNORMAL LOW (ref >60)
GFR, EST NON AFRICAN AMERICAN: 14 mL/min — AB (ref >60)
Glucose, Bld: 93 mg/dL (ref 65–99)
Potassium: 4.4 mmol/L (ref 3.5–5.1)
SODIUM: 136 mmol/L (ref 135–145)

## 2016-09-26 LAB — PROTEIN ELECTROPHORESIS, SERUM
A/G Ratio: 1 (ref 0.7–1.7)
Albumin ELP: 2.6 g/dL — ABNORMAL LOW (ref 2.9–4.4)
Alpha-1-Globulin: 0.2 g/dL (ref 0.0–0.4)
Alpha-2-Globulin: 1 g/dL (ref 0.4–1.0)
BETA GLOBULIN: 0.6 g/dL — AB (ref 0.7–1.3)
GAMMA GLOBULIN: 0.8 g/dL (ref 0.4–1.8)
Globulin, Total: 2.6 g/dL (ref 2.2–3.9)
TOTAL PROTEIN ELP: 5.2 g/dL — AB (ref 6.0–8.5)

## 2016-09-26 LAB — PROTEIN ELECTRO, RANDOM URINE
ALPHA-1-GLOBULIN, U: 6.1 %
ALPHA-2-GLOBULIN, U: 3.6 %
Albumin ELP, Urine: 73.7 %
Beta Globulin, U: 8.4 %
GAMMA GLOBULIN, U: 8.1 %
Total Protein, Urine: 165.5 mg/dL

## 2016-09-26 LAB — GLOMERULAR BASEMENT MEMBRANE ANTIBODIES: GBM Ab: 4 units (ref 0–20)

## 2016-09-26 MED ORDER — POLYETHYLENE GLYCOL 3350 17 G PO PACK: 17.0000 g | PACK | Freq: Every day | ORAL | Status: DC

## 2016-09-26 MED ORDER — POLYETHYLENE GLYCOL 3350 17 G PO PACK
17.0000 g | PACK | Freq: Every day | ORAL | Status: DC
  Administered 2016-09-26 – 2016-09-27 (×2): 17 g via ORAL
  Filled 2016-09-26 (×2): qty 1

## 2016-09-26 NOTE — Plan of Care (Signed)
Problem: Bowel/Gastric: Goal: Will not experience complications related to bowel motility Outcome: Not Progressing Pt has not had BM in five days despite administration of miralax and stool softeners  Problem: Activity: Goal: Activity intolerance will improve Outcome: Completed/Met Date Met: 09/26/16 Pt ambulates frequently without assistance

## 2016-09-26 NOTE — Progress Notes (Signed)
[facility] [place] NAME: [name]    MR#:  [id number]  DATE OF BIRTH:  1956/07/04  SUBJECTIVE:  CHIEF COMPLAINT:   Chief Complaint  Patient presents with  . Chest Pain   - Complains of low back pain. Urine output is good  REVIEW OF SYSTEMS:  Review of Systems  Constitutional: Negative for chills, fever and malaise/fatigue.  HENT: Negative for congestion, ear discharge, hearing loss and nosebleeds.   Eyes: Negative for blurred vision and double vision.  Respiratory: Negative for cough, shortness of breath and wheezing.   Cardiovascular: Negative for chest pain, palpitations and leg swelling.  Gastrointestinal: Positive for constipation. Negative for abdominal pain, diarrhea, nausea and vomiting.  Genitourinary: Negative for dysuria.  Musculoskeletal: Positive for back pain and myalgias.  Neurological: Negative for dizziness, sensory change, speech change, focal weakness, seizures and headaches.  Psychiatric/Behavioral: Negative for depression.    DRUG ALLERGIES:   Allergies  Allergen Reactions  . Cyclobenzaprine Nausea Only  . Clonidine Other (See Comments)    Asymptomatic bradycardia to 38  . Furosemide Other (See Comments)    Caused renal failure  . Tylenol [Acetaminophen] Other (See Comments)    Reaction:  Pt states it bothers his liver    VITALS:  Blood pressure 131/74, pulse 65, temperature 98 F (36.7 C), temperature source Oral, resp. rate 18, height 6' (1.829 m), weight 127.7 kg (281 lb 8 oz), SpO2 96 %.  PHYSICAL EXAMINATION:  Physical Exam  GENERAL:  61 y.o.-year-old patient lying in the bed with no acute distress.  EYES: Pupils equal, round, reactive to light and accommodation. No scleral icterus. Extraocular muscles intact.  HEENT: Head atraumatic, normocephalic. Oropharynx and nasopharynx clear.  NECK:  Supple, no jugular venous distention. No thyroid enlargement, no tenderness.  LUNGS: Normal  breath sounds bilaterally, no wheezing, rales,rhonchi or crepitation. No use of accessory muscles of respiration.  CARDIOVASCULAR: S1, S2 normal. No murmurs, rubs, or gallops.  ABDOMEN: Soft, nontender, nondistended. Bowel sounds present. No organomegaly or mass.  EXTREMITIES: No pedal edema, cyanosis, or clubbing. 2+ pedal pulses palpated bilaterally NEUROLOGIC: Cranial nerves II through XII are intact. Muscle strength 5/5 in all extremities. Sensation intact. Gait not checked.  PSYCHIATRIC: The patient is alert and oriented x 3.  SKIN: No obvious rash, lesion, or ulcer.    LABORATORY PANEL:   CBC  Recent Labs Lab 09/21/16 1009  WBC 6.6  HGB 12.3*  HCT 34.8*  PLT 152   ------------------------------------------------------------------------------------------------------------------  Chemistries   Recent Labs Lab 09/26/16 0354  NA 136  K 4.4  CL 105  CO2 24  GLUCOSE 93  BUN 34*  CREATININE 4.28*  CALCIUM 8.5*   ------------------------------------------------------------------------------------------------------------------  Cardiac Enzymes  Recent Labs Lab 09/21/16 1334  TROPONINI 0.03*   ------------------------------------------------------------------------------------------------------------------  RADIOLOGY:  No results found.  EKG:   Orders placed or performed during the hospital encounter of 09/21/16  . EKG 12-Lead  . EKG 12-Lead  . ED EKG within 10 minutes  . ED EKG within 10 minutes    ASSESSMENT AND PLAN:   61 year old male with past medical history significant for CAD, hypertension, history of pulmonary embolism, CK D stage IV on compliant with his medications and also follow-ups presents to hospital secondary to elevated blood pressure and also noted to be in acute renal failure.   #1 Hypertensive urgency-medication noncompliance. Blood pressure is well controlled. -Continue Norvasc, oral hydralazine, Imdur and labetalol  #2 acute renal  failure on  CKD stage IV-Baseline creatinine seems to be around 2.9 with GFR of 26. Renal function worsened up to 4.5 in the hospital. Slowly improving at this time -Likely ATN from diminished renal perfusion  from improving blood pressure control -Continues to make good amounts of urine. No acute indication for dialysis yet. -Appreciate nephrology consult. Avoid nephrotoxins. Wait for renal function to improve- stable at this time  #3 BPH-on Flomax and Hytrin  #4 chronic pain syndrome-takes a lot of pain medications.  - Unable to clearly give me an answer if he has an outpatient pain management clinic or his primary care prescribes those pain medications. Continue OxyContin twice a day.  -Space Out the breakthrough oxycodone at this time.  #5 DVT prophylaxis-on subcutaneous heparin  #6 constipation-senna, Colace and MiraLAX  Renal function continues to improve, possible discharge tomorrow   All the records are reviewed and case discussed with Care Management/Social Workerr. Management plans discussed with the patient, family and they are in agreement.  CODE STATUS: Full code  TOTAL TIME TAKING CARE OF THIS PATIENT: 36 minutes.   POSSIBLE D/C tomorrow, DEPENDING ON CLINICAL CONDITION.   Gladstone Lighter M.D on 09/26/2016 at 2:05 PM  Between 7am to 6pm - Pager - [phone]  After 6pm go to [email or web address] - password [email or web address] [facility]  Office  [phone]  CC: Primary care physician; No PCP Per Patient

## 2016-09-26 NOTE — Progress Notes (Signed)
[facility]  ROUNDING NOTE   Subjective:   UOP 2850  Creatinine 4.28 (4.48)   Objective:  Vital signs in last 24 hours:  Temp:  [97.7 F (36.5 C)-98.4 F (36.9 C)] 98 F (36.7 C) (03/19 1345) Pulse Rate:  [64-67] 65 (03/19 1345) Resp:  [17-20] 18 (03/19 1345) BP: (120-131)/(67-81) 131/74 (03/19 1345) SpO2:  [96 %-98 %] 96 % (03/19 1345) Weight:  [127.7 kg (281 lb 8 oz)] 127.7 kg (281 lb 8 oz) (03/19 0500)  Weight change: 0.59 kg (1 lb 4.8 oz) Filed Weights   09/24/16 0337 09/25/16 0428 09/26/16 0500  Weight: 125.6 kg (276 lb 14.4 oz) 127.1 kg (280 lb 3.2 oz) 127.7 kg (281 lb 8 oz)    Intake/Output: I/O last 3 completed shifts: In: 1706.5 [P.O.:940; I.V.:766.5] Out: 5100 [Urine:5100]   Intake/Output this shift:  Total I/O In: 600 [P.O.:600] Out: 225 [Urine:225]  Physical Exam: General: No acute distress  Head: Normocephalic, atraumatic. Moist oral mucosal membranes  Eyes: Anicteric  Neck: Supple, trachea midline  Lungs:  Clear to auscultation, normal effort  Heart: S1S2 no rubs  Abdomen:  Soft, nontender  Extremities: No peripheral edema.  Neurologic: Nonfocal, moving all four extremities  Skin: No lesions       Basic Metabolic Panel:  Recent Labs Lab 09/22/16 0311 09/23/16 0454 09/23/16 1830 09/24/16 0427 09/25/16 0432 09/26/16 0354  NA 138 134*  --  136 137 136  K 4.1 3.9  --  3.8 4.0 4.4  CL 104 101  --  103 106 105  CO2 27 27  --  27 27 24   GLUCOSE 120* 103*  --  84 89 93  BUN 34* 35*  --  34* 35* 34*  CREATININE 3.86* 4.30*  --  4.56* 4.43* 4.28*  CALCIUM 8.3* 7.8*  --  8.1* 8.1* 8.5*  PHOS  --  4.4 4.2 4.0  --   --     Liver Function Tests:  Recent Labs Lab 09/23/16 0454 09/24/16 0427  ALBUMIN 2.7* 2.8*   No results for input(s): LIPASE, AMYLASE in the last 168 hours. No results for input(s): AMMONIA in the last 168 hours.  CBC:  Recent Labs Lab 09/21/16 1009  WBC 6.6  HGB 12.3*  HCT 34.8*  MCV 84.0  PLT  152    Cardiac Enzymes:  Recent Labs Lab 09/21/16 1009 09/21/16 1334  TROPONINI 0.03* 0.03*    BNP: Invalid input(s): POCBNP  CBG: No results for input(s): GLUCAP in the last 168 hours.  Microbiology: Results for orders placed or performed during the hospital encounter of 02/25/16  Urine culture     Status: Abnormal   Collection Time: 02/25/16  7:43 PM  Result Value Ref Range Status   Specimen Description URINE, RANDOM  Final   Special Requests NONE  Final   Culture MULTIPLE SPECIES PRESENT, SUGGEST RECOLLECTION (A)  Final   Report Status 02/27/2016 FINAL  Final    Coagulation Studies: No results for input(s): LABPROT, INR in the last 72 hours.  Urinalysis: No results for input(s): COLORURINE, LABSPEC, PHURINE, GLUCOSEU, HGBUR, BILIRUBINUR, KETONESUR, PROTEINUR, UROBILINOGEN, NITRITE, LEUKOCYTESUR in the last 72 hours.  Invalid input(s): APPERANCEUR    Imaging: No results found.   Medications:    . amLODipine  10 mg Oral Daily  . aspirin EC  81 mg Oral Daily  . atorvastatin  40 mg Oral Daily  . clopidogrel  75 mg Oral Daily  . heparin subcutaneous  5,000 Units Subcutaneous Q8H  .  hydrALAZINE  100 mg Oral TID  . isosorbide mononitrate  60 mg Oral Daily  . labetalol  300 mg Oral TID  . oxyCODONE  20 mg Oral Q12H  . polyethylene glycol  17 g Oral Daily  . senna  1 tablet Oral BID  . sodium chloride flush  3 mL Intravenous Q12H  . sucralfate  1 g Oral TID AC & HS  . tamsulosin  0.4 mg Oral QPC breakfast  . terazosin  5 mg Oral QHS   acetaminophen **OR** acetaminophen, albuterol, butalbital-acetaminophen-caffeine, labetalol, ondansetron **OR** ondansetron (ZOFRAN) IV, oxyCODONE, traZODone  Assessment/ Plan:  61 y.o. male with a PMHx of depression, hypertension, pulmonary embolism, chronic kidney disease stage IV, who was admitted to [facility] on 09/21/2016 for evaluation of chest pain.  Upon initial presentation he was found to have severe hypertension with a  systolic blood pressure greater than 200.   1.  Acute renal failure on chronic kidney disease stage IV with proteinuria: baseline creatinine of 3.06., GFR of 21 on 06/23/16. Followed with [facility] in past.  Acute renal failure from acute cardiorenal syndrome, hypertensive emergency. Improving with IV fluids. Nonoliguric urine output. No indication for dialysis.  Not currently on an ACE-I/ARB  2.  Hypertension.  Blood pressure at goal. Holding hydrochlorothiazide.  - Continue amlodipine, hydralazine, Imdur, labetalol, tamsulosin and terzosin.  3. Renal Mass:  - outpatient urology referral.    LOS: 3 Neoma Uhrich 3/19/20183:54 PM

## 2016-09-27 LAB — BASIC METABOLIC PANEL
Anion gap: 6 (ref 5–15)
BUN: 36 mg/dL — AB (ref 6–20)
CHLORIDE: 105 mmol/L (ref 101–111)
CO2: 24 mmol/L (ref 22–32)
Calcium: 8.7 mg/dL — ABNORMAL LOW (ref 8.9–10.3)
Creatinine, Ser: 4.11 mg/dL — ABNORMAL HIGH (ref 0.61–1.24)
GFR calc Af Amer: 17 mL/min — ABNORMAL LOW (ref >60)
GFR calc non Af Amer: 15 mL/min — ABNORMAL LOW (ref >60)
Glucose, Bld: 97 mg/dL (ref 65–99)
POTASSIUM: 4.1 mmol/L (ref 3.5–5.1)
SODIUM: 135 mmol/L (ref 135–145)

## 2016-09-27 MED ORDER — FLEET ENEMA 7-19 GM/118ML RE ENEM
1.0000 | ENEMA | Freq: Once | RECTAL | Status: AC
  Administered 2016-09-27: 1 via RECTAL

## 2016-09-27 MED ORDER — BISACODYL 10 MG RE SUPP
10.0000 mg | Freq: Once | RECTAL | Status: AC
Start: 2016-09-27 — End: 2016-09-27
  Administered 2016-09-27: 10 mg via RECTAL
  Filled 2016-09-27: qty 1

## 2016-09-27 MED ORDER — LABETALOL HCL 300 MG PO TABS: 300.0000 mg | ORAL_TABLET | Freq: Three times a day (TID) | ORAL | 2 refills | Status: DC

## 2016-09-27 MED ORDER — HYDRALAZINE HCL 100 MG PO TABS: 100.0000 mg | ORAL_TABLET | Freq: Three times a day (TID) | ORAL | 2 refills | Status: DC

## 2016-09-27 MED ORDER — TAMSULOSIN HCL 0.4 MG PO CAPS: 0.4000 mg | ORAL_CAPSULE | Freq: Every day | ORAL | 5 refills | Status: DC

## 2016-09-27 MED ORDER — BISACODYL 5 MG PO TBEC
10.0000 mg | DELAYED_RELEASE_TABLET | Freq: Once | ORAL | Status: AC
Start: 2016-09-27 — End: 2016-09-27
  Administered 2016-09-27: 10 mg via ORAL
  Filled 2016-09-27: qty 2

## 2016-09-27 MED ORDER — ISOSORBIDE MONONITRATE ER 60 MG PO TB24: 60.0000 mg | ORAL_TABLET | Freq: Every day | ORAL | 2 refills | Status: DC

## 2016-09-27 MED ORDER — TERAZOSIN HCL 5 MG PO CAPS: 5.0000 mg | ORAL_CAPSULE | Freq: Every day | ORAL | 2 refills | Status: DC

## 2016-09-27 MED ORDER — METHYLNALTREXONE BROMIDE 12 MG/0.6ML ~~LOC~~ SOLN
12.0000 mg | Freq: Once | SUBCUTANEOUS | Status: AC
  Administered 2016-09-27: 12 mg via SUBCUTANEOUS
  Filled 2016-09-27: qty 0.6

## 2016-09-27 MED ORDER — SENNOSIDES-DOCUSATE SODIUM 8.6-50 MG PO TABS: 1.0000 | ORAL_TABLET | Freq: Two times a day (BID) | ORAL | 2 refills | Status: DC

## 2016-09-27 NOTE — Care Management Important Message (Signed)
Important Message  Patient Details  Name: [name] MRN: [id number] Date of Birth: 1956/02/19   Medicare Important Message Given:  Yes    Beverly Sessions, RN 09/27/2016, 1:20 PM

## 2016-09-27 NOTE — Discharge Summary (Signed)
[place] [facility] [place] NAME: Greely Atiyeh    MR#:  [id number]  DATE OF BIRTH:  04-19-1956  DATE OF ADMISSION:  09/21/2016   ADMITTING PHYSICIAN: Hillary Bow, MD  DATE OF DISCHARGE: 09/27/16  PRIMARY CARE PHYSICIAN: No PCP Per Patient   ADMISSION DIAGNOSIS:   Hypertensive emergency [I16.1]  DISCHARGE DIAGNOSIS:   Active Problems:   Hypertensive urgency   Acute on chronic renal failure ([place])   SECONDARY DIAGNOSIS:   Past Medical History:  Diagnosis Date  . Depression   . Heart attack   . Hypertension   . PE (pulmonary embolism)   . Renal disorder     HOSPITAL COURSE:   61 year old male with past medical history significant for CAD, hypertension, history of pulmonary embolism, CK D stage IV on compliant with his medications and also follow-ups presents to hospital secondary to elevated blood pressure and also noted to be in acute renal failure.   #1 Hypertensive urgency-medication noncompliance. Blood pressure is well controlled. -Continue Norvasc, oral hydralazine, Imdur and labetalol  #2 acute renal failure on CKD stage IV-Baseline creatinine seems to be around 2.9 with GFR of 26. Renal function slowly improving up to 4 now - non oliguric renal failure -Likely ATN from diminished renal perfusion  from improving blood pressure control -Continues to make good amounts of urine. No acute indication for dialysis yet. -Appreciate nephrology consult. Avoid nephrotoxins.  - f/u as outpatient  #3 BPH-on Flomax and Hytrin  #4 chronic pain syndrome-takes a lot of pain medications.  - Unable to clearly give me an answer if he has an outpatient pain management clinic or his primary care prescribes those pain medications. Continue OxyContin twice a day.  -Space Out the breakthrough oxycodone at this time. - NO PAIN MEDICATIONS PRESCRIBED BY ME AT DISCHARGE  #6 constipation-laxatives given  Discharge  today   DISCHARGE CONDITIONS:   Guarded  CONSULTS OBTAINED:   Treatment Team:  Anthonette Legato, MD  DRUG ALLERGIES:   Allergies  Allergen Reactions  . Cyclobenzaprine Nausea Only  . Clonidine Other (See Comments)    Asymptomatic bradycardia to 38  . Furosemide Other (See Comments)    Caused renal failure  . Tylenol [Acetaminophen] Other (See Comments)    Reaction:  Pt states it bothers his liver   DISCHARGE MEDICATIONS:   Allergies as of 09/27/2016      Reactions   Cyclobenzaprine Nausea Only   Clonidine Other (See Comments)   Asymptomatic bradycardia to 38   Furosemide Other (See Comments)   Caused renal failure   Tylenol [acetaminophen] Other (See Comments)   Reaction:  Pt states it bothers his liver      Medication List    STOP taking these medications   carvedilol 6.25 MG tablet Commonly known as:  COREG   famotidine 20 MG tablet Commonly known as:  PEPCID   hydrochlorothiazide 25 MG tablet Commonly known as:  HYDRODIURIL   potassium chloride 10 MEQ tablet Commonly known as:  K-DUR     TAKE these medications   amLODipine 10 MG tablet Commonly known as:  NORVASC Take 10 mg by mouth daily.   aspirin EC 81 MG tablet Take 81 mg by mouth daily.   atorvastatin 40 MG tablet Commonly known as:  LIPITOR Take 40 mg by mouth daily.   clopidogrel 75 MG tablet Commonly known as:  PLAVIX Take 75 mg by mouth daily.   hydrALAZINE 100 MG tablet Commonly known as:  APRESOLINE Take 1 tablet (100 mg total) by mouth 3 (three) times daily. What changed:  when to take this   isosorbide mononitrate 60 MG 24 hr tablet Commonly known as:  IMDUR Take 1 tablet (60 mg total) by mouth daily. Start taking on:  09/28/2016   labetalol 300 MG tablet Commonly known as:  NORMODYNE Take 1 tablet (300 mg total) by mouth 3 (three) times daily.   oxyCODONE 20 mg 12 hr tablet Commonly known as:  OXYCONTIN Take 20 mg by mouth every 12 (twelve) hours.   Oxycodone HCl 10  MG Tabs Take 10-20 mg by mouth every 4 (four) hours as needed (for pain).   polyethylene glycol packet Commonly known as:  MIRALAX / GLYCOLAX Take 17 g by mouth daily as needed for mild constipation.   senna-docusate 8.6-50 MG tablet Commonly known as:  Senokot-S Take 1 tablet by mouth 2 (two) times daily.   sucralfate 1 g tablet Commonly known as:  CARAFATE Take 1 tablet (1 g total) by mouth 4 (four) times daily.   tamsulosin 0.4 MG Caps capsule Commonly known as:  FLOMAX Take 1 capsule (0.4 mg total) by mouth daily. What changed:  when to take this  reasons to take this   terazosin 5 MG capsule Commonly known as:  HYTRIN Take 1 capsule (5 mg total) by mouth at bedtime.   traZODone 100 MG tablet Commonly known as:  DESYREL Take 100 mg by mouth at bedtime as needed for sleep.        DISCHARGE INSTRUCTIONS:   1. PCP f/u in 1 week 2. Nephrology f/u in 1 week  DIET:   Renal diet  ACTIVITY:   Activity as tolerated  OXYGEN:   Home Oxygen: No.  Oxygen Delivery: room air  DISCHARGE LOCATION:   home   If you experience worsening of your admission symptoms, develop shortness of breath, life threatening emergency, suicidal or homicidal thoughts you must seek medical attention immediately by calling 911 or calling your MD immediately  if symptoms less severe.  You Must read complete instructions/literature along with all the possible adverse reactions/side effects for all the Medicines you take and that have been prescribed to you. Take any new Medicines after you have completely understood and accpet all the possible adverse reactions/side effects.   Please note  You were cared for by a hospitalist during your hospital stay. If you have any questions about your discharge medications or the care you received while you were in the hospital after you are discharged, you can call the unit and asked to speak with the hospitalist on call if the hospitalist that took  care of you is not available. Once you are discharged, your primary care physician will handle any further medical issues. Please note that NO REFILLS for any discharge medications will be authorized once you are discharged, as it is imperative that you return to your primary care physician (or establish a relationship with a primary care physician if you do not have one) for your aftercare needs so that they can reassess your need for medications and monitor your lab values.    On the day of Discharge:  VITAL SIGNS:   Blood pressure 127/88, pulse 74, temperature 98 F (36.7 C), temperature source Oral, resp. rate 16, height 6' (1.829 m), weight 127.6 kg (281 lb 6.4 oz), SpO2 96 %.  PHYSICAL EXAMINATION:    GENERAL:  61 y.o.-year-old patient lying in the bed with no acute distress.  EYES: Pupils  equal, round, reactive to light and accommodation. No scleral icterus. Extraocular muscles intact.  HEENT: Head atraumatic, normocephalic. Oropharynx and nasopharynx clear.  NECK:  Supple, no jugular venous distention. No thyroid enlargement, no tenderness.  LUNGS: Normal breath sounds bilaterally, no wheezing, rales,rhonchi or crepitation. No use of accessory muscles of respiration.  CARDIOVASCULAR: S1, S2 normal. No murmurs, rubs, or gallops.  ABDOMEN: Soft, nontender, nondistended. Bowel sounds present. No organomegaly or mass.  EXTREMITIES: No pedal edema, cyanosis, or clubbing. 2+ pedal pulses palpated bilaterally NEUROLOGIC: Cranial nerves II through XII are intact. Muscle strength 5/5 in all extremities. Sensation intact. Gait not checked.  PSYCHIATRIC: The patient is alert and oriented x 3.  SKIN: No obvious rash, lesion, or ulcer.    DATA REVIEW:   CBC  Recent Labs Lab 09/21/16 1009  WBC 6.6  HGB 12.3*  HCT 34.8*  PLT 152    Chemistries   Recent Labs Lab 09/27/16 0508  NA 135  K 4.1  CL 105  CO2 24  GLUCOSE 97  BUN 36*  CREATININE 4.11*  CALCIUM 8.7*      Microbiology Results  Results for orders placed or performed during the hospital encounter of 02/25/16  Urine culture     Status: Abnormal   Collection Time: 02/25/16  7:43 PM  Result Value Ref Range Status   Specimen Description URINE, RANDOM  Final   Special Requests NONE  Final   Culture MULTIPLE SPECIES PRESENT, SUGGEST RECOLLECTION (A)  Final   Report Status 02/27/2016 FINAL  Final    RADIOLOGY:  No results found.   Management plans discussed with the patient, family and they are in agreement.  CODE STATUS:     Code Status Orders        Start     Ordered   09/21/16 1519  Full code  Continuous     09/21/16 1519    Code Status History    Date Active Date Inactive Code Status Order ID Comments User Context   04/28/2016 11:25 PM 04/29/2016  8:43 PM Full Code [id number]  Abigail Butts, PA-C ED   01/18/2016  2:02 AM 01/18/2016  6:48 PM Full Code [id number]  Quintella Baton, MD Inpatient      TOTAL TIME TAKING CARE OF THIS PATIENT: 38 minutes.    Kaisyn Millea M.D on 09/27/2016 at 3:42 PM  Between 7am to 6pm - Pager - [phone]  After 6pm go to [email or web address] - Technical [employer] [facility]  Office  [phone]  CC: Primary care physician; No PCP Per Patient   Note: This dictation was prepared with Dragon dictation along with smaller phrase technology. Any transcriptional errors that result from this process are unintentional.

## 2016-09-27 NOTE — Progress Notes (Signed)
[facility]  ROUNDING NOTE   Subjective:   Complains of constipation.  Creatinine 4.11 (4.28) (4.48)   Objective:  Vital signs in last 24 hours:  Temp:  [97.7 F (36.5 C)-98.1 F (36.7 C)] 97.7 F (36.5 C) (03/20 0505) Pulse Rate:  [62-68] 62 (03/20 0505) Resp:  [18] 18 (03/20 0505) BP: (129-143)/(73-89) 143/89 (03/20 0505) SpO2:  [96 %-98 %] 98 % (03/20 0505) Weight:  [127.6 kg (281 lb 6.4 oz)] 127.6 kg (281 lb 6.4 oz) (03/20 0500)  Weight change: -0.045 kg (-1.6 oz) Filed Weights   09/25/16 0428 09/26/16 0500 09/27/16 0500  Weight: 127.1 kg (280 lb 3.2 oz) 127.7 kg (281 lb 8 oz) 127.6 kg (281 lb 6.4 oz)    Intake/Output: I/O last 3 completed shifts: In: 2066.5 [P.O.:1300; I.V.:766.5] Out: 1725 [Urine:1725]   Intake/Output this shift:  Total I/O In: 480 [P.O.:480] Out: 600 [Urine:600]  Physical Exam: General: No acute distress  Head: Normocephalic, atraumatic. Moist oral mucosal membranes  Eyes: Anicteric  Neck: Supple, trachea midline  Lungs:  Clear to auscultation, normal effort  Heart: S1S2 no rubs  Abdomen:  Soft, nontender  Extremities: No peripheral edema.  Neurologic: Nonfocal, moving all four extremities  Skin: No lesions       Basic Metabolic Panel:  Recent Labs Lab 09/23/16 0454 09/23/16 1830 09/24/16 0427 09/25/16 0432 09/26/16 0354 09/27/16 0508  NA 134*  --  136 137 136 135  K 3.9  --  3.8 4.0 4.4 4.1  CL 101  --  103 106 105 105  CO2 27  --  27 27 24 24   GLUCOSE 103*  --  84 89 93 97  BUN 35*  --  34* 35* 34* 36*  CREATININE 4.30*  --  4.56* 4.43* 4.28* 4.11*  CALCIUM 7.8*  --  8.1* 8.1* 8.5* 8.7*  PHOS 4.4 4.2 4.0  --   --   --     Liver Function Tests:  Recent Labs Lab 09/23/16 0454 09/24/16 0427  ALBUMIN 2.7* 2.8*   No results for input(s): LIPASE, AMYLASE in the last 168 hours. No results for input(s): AMMONIA in the last 168 hours.  CBC:  Recent Labs Lab 09/21/16 1009  WBC 6.6  HGB 12.3*  HCT  34.8*  MCV 84.0  PLT 152    Cardiac Enzymes:  Recent Labs Lab 09/21/16 1009 09/21/16 1334  TROPONINI 0.03* 0.03*    BNP: Invalid input(s): POCBNP  CBG: No results for input(s): GLUCAP in the last 168 hours.  Microbiology: Results for orders placed or performed during the hospital encounter of 02/25/16  Urine culture     Status: Abnormal   Collection Time: 02/25/16  7:43 PM  Result Value Ref Range Status   Specimen Description URINE, RANDOM  Final   Special Requests NONE  Final   Culture MULTIPLE SPECIES PRESENT, SUGGEST RECOLLECTION (A)  Final   Report Status 02/27/2016 FINAL  Final    Coagulation Studies: No results for input(s): LABPROT, INR in the last 72 hours.  Urinalysis: No results for input(s): COLORURINE, LABSPEC, PHURINE, GLUCOSEU, HGBUR, BILIRUBINUR, KETONESUR, PROTEINUR, UROBILINOGEN, NITRITE, LEUKOCYTESUR in the last 72 hours.  Invalid input(s): APPERANCEUR    Imaging: No results found.   Medications:    . amLODipine  10 mg Oral Daily  . aspirin EC  81 mg Oral Daily  . atorvastatin  40 mg Oral Daily  . clopidogrel  75 mg Oral Daily  . heparin subcutaneous  5,000 Units Subcutaneous Q8H  .  hydrALAZINE  100 mg Oral TID  . isosorbide mononitrate  60 mg Oral Daily  . labetalol  300 mg Oral TID  . oxyCODONE  20 mg Oral Q12H  . sodium chloride flush  3 mL Intravenous Q12H  . sucralfate  1 g Oral TID AC & HS  . tamsulosin  0.4 mg Oral QPC breakfast  . terazosin  5 mg Oral QHS   acetaminophen **OR** acetaminophen, albuterol, butalbital-acetaminophen-caffeine, labetalol, ondansetron **OR** ondansetron (ZOFRAN) IV, oxyCODONE, traZODone  Assessment/ Plan:  61 y.o.black male with  depression, hypertension, pulmonary embolism, chronic kidney disease stage IV, who was admitted to [facility] on 09/21/2016 for evaluation of chest pain.   1.  Acute renal failure on chronic kidney disease stage IV with proteinuria: baseline creatinine of 3.06., GFR of 21 on  06/23/16.   Acute renal failure from acute cardiorenal syndrome, hypertensive emergency. Off IV fluids Not currently on an ACE-I/ARB - Creatinine improving. Nonoliguric urine output.   2.  Hypertension.  Blood pressure at goal. Holding hydrochlorothiazide.  - Continue amlodipine, hydralazine, Imdur, labetalol, tamsulosin and terzosin.  3. Renal Mass:  - outpatient urology referral.    LOS: 4 Sarinah Doetsch 3/20/201812:28 PM

## 2016-09-27 NOTE — Progress Notes (Signed)
Patient informed that the doctor is discharging him home and patient states that his ride is at work and that he does not have a way home at this time. Patient is alert and oriented, no acute distress noted.

## 2016-09-27 NOTE — Progress Notes (Signed)
Patient discharged to home as ordered. Follow up appointments given as ordered. Patient instructed to continue his stool softener's as ordered continue to drink water as ordered by doctor. Patient is alert and oriented, denies pain at this time, no acute distress noted. Patient ride is on the way according to patient

## 2016-10-05 ENCOUNTER — Emergency Department: Payer: Medicare Other

## 2016-10-05 ENCOUNTER — Emergency Department
Admission: EM | Admit: 2016-10-05 | Discharge: 2016-10-05 | Disposition: A | Discharge status: Home / Self Care | Payer: Medicare Other | Attending: Emergency Medicine | Admitting: Emergency Medicine

## 2016-10-05 DIAGNOSIS — Z96649 Presence of unspecified artificial hip joint: Secondary | ICD-10-CM | POA: Insufficient documentation

## 2016-10-05 DIAGNOSIS — F17210 Nicotine dependence, cigarettes, uncomplicated: Secondary | ICD-10-CM | POA: Insufficient documentation

## 2016-10-05 DIAGNOSIS — R079 Chest pain, unspecified: Secondary | ICD-10-CM | POA: Insufficient documentation

## 2016-10-05 DIAGNOSIS — Z79899 Other long term (current) drug therapy: Secondary | ICD-10-CM | POA: Diagnosis not present

## 2016-10-05 DIAGNOSIS — I10 Essential (primary) hypertension: Secondary | ICD-10-CM | POA: Diagnosis not present

## 2016-10-05 LAB — CBC
HEMATOCRIT: 32.7 % — AB (ref 40.0–52.0)
Hemoglobin: 11.3 g/dL — ABNORMAL LOW (ref 13.0–18.0)
MCH: 29.5 pg (ref 26.0–34.0)
MCHC: 34.5 g/dL (ref 32.0–36.0)
MCV: 85.4 fL (ref 80.0–100.0)
PLATELETS: 166 10*3/uL (ref 150–440)
RBC: 3.83 MIL/uL — ABNORMAL LOW (ref 4.40–5.90)
RDW: 14.1 % (ref 11.5–14.5)
WBC: 6.3 10*3/uL (ref 3.8–10.6)

## 2016-10-05 LAB — BASIC METABOLIC PANEL
Anion gap: 6 (ref 5–15)
BUN: 39 mg/dL — AB (ref 6–20)
CALCIUM: 8.4 mg/dL — AB (ref 8.9–10.3)
CO2: 25 mmol/L (ref 22–32)
Chloride: 104 mmol/L (ref 101–111)
Creatinine, Ser: 3.85 mg/dL — ABNORMAL HIGH (ref 0.61–1.24)
GFR calc Af Amer: 18 mL/min — ABNORMAL LOW (ref >60)
GFR, EST NON AFRICAN AMERICAN: 16 mL/min — AB (ref >60)
GLUCOSE: 111 mg/dL — AB (ref 65–99)
POTASSIUM: 4.1 mmol/L (ref 3.5–5.1)
SODIUM: 135 mmol/L (ref 135–145)

## 2016-10-05 LAB — TROPONIN I: Troponin I: <0.03 ng/mL (ref <0.03)

## 2016-10-05 NOTE — ED Provider Notes (Signed)
[facility] Emergency Department Provider Note       Time seen: ----------------------------------------- 8:49 PM on 10/05/2016 -----------------------------------------     I have reviewed the triage vital signs and the nursing notes.   HISTORY   Chief Complaint Chest Pain    HPI [name] is a 61 y.o. male who presents to the ED for mid chest pain that is nonradiating since yesterday. He's had accompanied shortness of breath and a history of same. Currently the pain is 8 out of 10, nothing makes it better or worse.   Past Medical History:  Diagnosis Date  . Depression   . Heart attack   . Hypertension   . PE (pulmonary embolism)   . Renal disorder     Patient Active Problem List   Diagnosis Date Noted  . Acute on chronic renal failure ([place]) 09/23/2016  . Hypertensive urgency 09/21/2016  . Dysthymia 02/26/2016  . Chronic pain 02/26/2016  . Noncompliance 02/26/2016  . Opiate abuse, continuous 02/03/2016  . Substance induced mood disorder ([place]) 02/03/2016  . Antisocial personality disorder 02/03/2016  . Left-sided weakness 01/18/2016  . HEPATITIS C 06/28/2008  . HYPERLIPIDEMIA 06/28/2008  . SUBSTANCE ABUSE, MULTIPLE 06/28/2008  . Hypertension 06/27/2008    Past Surgical History:  Procedure Laterality Date  . TOTAL HIP ARTHROPLASTY      Allergies Cyclobenzaprine; Clonidine; Furosemide; and Tylenol [acetaminophen]  Social History Social History  Substance Use Topics  . Smoking status: Current Every Day Smoker    Packs/day: 0.50    Types: Cigarettes  . Smokeless tobacco: Never Used  . Alcohol use No    Review of Systems Constitutional: Negative for fever. Cardiovascular:Positive for chest pain Respiratory: Positive shortness of breath Gastrointestinal: Negative for abdominal pain, vomiting and diarrhea. Genitourinary: Negative for dysuria. Musculoskeletal: Negative for back pain. Skin: Negative for  rash. Neurological: Negative for headaches, focal weakness or numbness.  10-point ROS otherwise negative.  ____________________________________________   PHYSICAL EXAM:  VITAL SIGNS: ED Triage Vitals  Enc Vitals Group     BP 10/05/16 2005 131/78     Pulse Rate 10/05/16 2005 66     Resp 10/05/16 2005 (!) 22     Temp 10/05/16 2005 98.4 F (36.9 C)     Temp Source 10/05/16 2005 Oral     SpO2 10/05/16 2005 97 %     Weight 10/05/16 2004 278 lb (126.1 kg)     Height 10/05/16 2004 6' (1.829 m)     Head Circumference --      Peak Flow --      Pain Score 10/05/16 2004 8     Pain Loc --      Pain Edu? --      Excl. in [place]? --     Constitutional: Alert and oriented. Well appearing and in no distress. Eyes: Conjunctivae are normal. PERRL. Normal extraocular movements. ENT   Head: Normocephalic and atraumatic.   Nose: No congestion/rhinnorhea.   Mouth/Throat: Mucous membranes are moist.   Neck: No stridor. Cardiovascular: Normal rate, regular rhythm. No murmurs, rubs, or gallops. Respiratory: Normal respiratory effort without tachypnea nor retractions. Breath sounds are clear and equal bilaterally. No wheezes/rales/rhonchi. Gastrointestinal: Soft and nontender. Normal bowel sounds Musculoskeletal: Nontender with normal range of motion in extremities. No lower extremity tenderness nor edema. Neurologic:  Normal speech and language. No gross focal neurologic deficits are appreciated.  Skin:  Skin is warm, dry and intact. No rash noted. Psychiatric: Mood and affect are normal. Speech and  behavior are normal.  ____________________________________________  EKG: Interpreted by me.Sinus rhythm rate of 70 bpm, normal. We'll, normal QRS, normal QT.  ____________________________________________  ED COURSE:  Pertinent labs & imaging results that were available during my care of the patient were reviewed by me and considered in my medical decision making (see chart for  details). Patient presents for Chest pain and shortness of breath, we will assess with labs and imaging as indicated.   Procedures ____________________________________________   LABS (pertinent positives/negatives)  Labs Reviewed  CBC - Abnormal; Notable for the following:       Result Value   RBC 3.83 (*)    Hemoglobin 11.3 (*)    HCT 32.7 (*)    All other components within normal limits  BASIC METABOLIC PANEL  TROPONIN I    RADIOLOGY  Chest x-ray is unremarkable  ____________________________________________  FINAL ASSESSMENT AND PLAN  Chest pain, dyspnea  Plan: Patient's labs and imaging were dictated above. Patient had presented for chest pain that is nonradiating. He does have some evidence of urinary retention that has recently been placed on Flomax likely for prostatic enlargement. His creatinine is improving. No worrisome causes of his chest pain identified at this time.   Earleen Newport, MD   Note: This note was generated in part or whole with voice recognition software. Voice recognition is usually quite accurate but there are transcription errors that can and very often do occur. I apologize for any typographical errors that were not detected and corrected.     Earleen Newport, MD 10/05/16 2152

## 2016-10-05 NOTE — ED Triage Notes (Signed)
Patient ambulatory to triage with steady gait, without difficulty or distress noted; pt reports mid CP, nonradiating since yesterday; accomp by [facility]; st hx of same

## 2016-10-05 NOTE — ED Notes (Signed)
Bladder scan showed 366mL of urine, MD made aware. Pt given urinal to see if he can urinate

## 2016-11-08 ENCOUNTER — Ambulatory Visit: Payer: Self-pay | Admitting: Urology

## 2016-11-08 ENCOUNTER — Encounter: Payer: Self-pay | Admitting: Urology

## 2016-11-08 DIAGNOSIS — R3915 Urgency of urination: Secondary | ICD-10-CM | POA: Insufficient documentation

## 2016-11-08 DIAGNOSIS — Z125 Encounter for screening for malignant neoplasm of prostate: Secondary | ICD-10-CM | POA: Insufficient documentation

## 2016-11-08 DIAGNOSIS — Z992 Dependence on renal dialysis: Secondary | ICD-10-CM

## 2016-11-08 DIAGNOSIS — N186 End stage renal disease: Secondary | ICD-10-CM | POA: Insufficient documentation

## 2016-11-08 DIAGNOSIS — N281 Cyst of kidney, acquired: Secondary | ICD-10-CM | POA: Insufficient documentation

## 2016-11-08 NOTE — Progress Notes (Deleted)
11/08/2016 6:55 AM   [name] November 06, 1955 [id number]  Referring provider: Anthonette Legato, MD [place], [place] [address]  HPI:  1. Chronic renal failure syndrome, stage 4 (severe) (HCC) - long h/o HTN and GFR decline with Cr 3-4. No hydro on imaging x many. Follows Dr. Holley Raring nephrology.   2. Prostate cancer screening - XXX FHX prostate cancer. NO PSA screening given level of comorbidity 10/2016 - DRE XXXXXX  3. Acquired cyst of kidney - slowly progressive Rt > Lt NON complex renal cysts on imaging x several. No nodules / enhancement / coarse calcifications / mass effect. He is near ESRD 2016 - contrast CT Rt 1.5cm simple cyst 2017 - CT x several - bilateral <2cm simple cysts 2018 - CT - Rt 2.5 and 1.5 non-complex cysts, Left <1cm cyst x several.  4. Urinary urgency - on terazosin as baseline with good control of mild obstructive smptoms and to help with BP  PMH sig for severe HTN, CAD, PE, Chronic pain / narcotics.  Today "[name]" is seen in consultation for above. He is referred by Dr. Holley Raring.    PMH: Past Medical History:  Diagnosis Date  . Depression   . Heart attack   . Hypertension   . PE (pulmonary embolism)   . Renal disorder     Surgical History: Past Surgical History:  Procedure Laterality Date  . TOTAL HIP ARTHROPLASTY      Home Medications:  Allergies as of 11/08/2016      Reactions   Cyclobenzaprine Nausea Only   Clonidine Other (See Comments)   Asymptomatic bradycardia to 38   Furosemide Other (See Comments)   Caused renal failure   Tylenol [acetaminophen] Other (See Comments)   Reaction:  Pt states it bothers his liver      Medication List       Accurate as of 11/08/16  6:55 AM. Always use your most recent med list.          amLODipine 10 MG tablet Commonly known as:  NORVASC Take 10 mg by mouth daily.   aspirin EC 81 MG tablet Take 81 mg by mouth daily.   atorvastatin 40 MG tablet Commonly known as:   LIPITOR Take 40 mg by mouth daily.   clopidogrel 75 MG tablet Commonly known as:  PLAVIX Take 75 mg by mouth daily.   hydrALAZINE 100 MG tablet Commonly known as:  APRESOLINE Take 1 tablet (100 mg total) by mouth 3 (three) times daily.   isosorbide mononitrate 60 MG 24 hr tablet Commonly known as:  IMDUR Take 1 tablet (60 mg total) by mouth daily.   labetalol 300 MG tablet Commonly known as:  NORMODYNE Take 1 tablet (300 mg total) by mouth 3 (three) times daily.   oxyCODONE 20 mg 12 hr tablet Commonly known as:  OXYCONTIN Take 20 mg by mouth every 12 (twelve) hours.   Oxycodone HCl 10 MG Tabs Take 10-20 mg by mouth every 4 (four) hours as needed (for pain).   polyethylene glycol packet Commonly known as:  MIRALAX / GLYCOLAX Take 17 g by mouth daily as needed for mild constipation.   senna-docusate 8.6-50 MG tablet Commonly known as:  Senokot-S Take 1 tablet by mouth 2 (two) times daily.   sucralfate 1 g tablet Commonly known as:  CARAFATE Take 1 tablet (1 g total) by mouth 4 (four) times daily.   tamsulosin 0.4 MG Caps capsule Commonly known as:  FLOMAX Take 1 capsule (  0.4 mg total) by mouth daily.   terazosin 5 MG capsule Commonly known as:  HYTRIN Take 1 capsule (5 mg total) by mouth at bedtime.   traZODone 100 MG tablet Commonly known as:  DESYREL Take 100 mg by mouth at bedtime as needed for sleep.       Allergies:  Allergies  Allergen Reactions  . Cyclobenzaprine Nausea Only  . Clonidine Other (See Comments)    Asymptomatic bradycardia to 38  . Furosemide Other (See Comments)    Caused renal failure  . Tylenol [Acetaminophen] Other (See Comments)    Reaction:  Pt states it bothers his liver    Family History: No family history on file.  Social History:  reports that he has been smoking Cigarettes.  He has been smoking about 0.50 packs per day. He has never used smokeless tobacco. He reports that he does not drink alcohol or use drugs.        Review of Systems  Gastrointestinal (upper)  : Negative for upper GI symptoms  Gastrointestinal (lower) : Negative for lower GI symptoms  Constitutional : Negative for symptoms  Skin: Negative for skin symptoms  Eyes: Negative for eye symptoms  Ear/Nose/Throat : Negative for Ear/Nose/Throat symptoms  Hematologic/Lymphatic: Negative for Hematologic/Lymphatic symptoms  Cardiovascular : Negative for cardiovascular symptoms  Respiratory : Negative for respiratory symptoms  Endocrine: Negative for endocrine symptoms  Musculoskeletal: Negative for musculoskeletal symptoms  Neurological: Negative for neurological symptoms  Psychologic: Negative for psychiatric symptoms   Physical Exam: There were no vitals taken for this visit.  Constitutional:  Alert and oriented, No acute distress. HEENT: [name] AT, moist mucus membranes.  Trachea midline, no masses. Cardiovascular: No clubbing, cyanosis, or edema. Respiratory: Normal respiratory effort, no increased work of breathing. GI: Abdomen is soft, nontender, nondistended, no abdominal masses GU: No CVA tenderness.  Skin: No rashes, bruises or suspicious lesions. Lymph: No cervical or inguinal adenopathy. Neurologic: Grossly intact, no focal deficits, moving all 4 extremities. Psychiatric: Normal mood and affect.  Laboratory Data: Lab Results  Component Value Date   WBC 6.3 10/05/2016   HGB 11.3 (L) 10/05/2016   HCT 32.7 (L) 10/05/2016   MCV 85.4 10/05/2016   PLT 166 10/05/2016    Lab Results  Component Value Date   CREATININE 3.85 (H) 10/05/2016    No results found for: PSA  No results found for: TESTOSTERONE  Lab Results  Component Value Date   HGBA1C (H) 10/28/2010    5.7 (NOTE)                                                                       According to the ADA Clinical Practice Recommendations for 2011, when HbA1c is used as a screening test:   >=6.5%   Diagnostic of Diabetes Mellitus            (if abnormal result  is confirmed)  5.7-6.4%   Increased risk of developing Diabetes Mellitus  References:Diagnosis and Classification of Diabetes Mellitus,Diabetes IWLN,9892,11(HERDE 1):S62-S69 and Standards of Medical Care in         Diabetes - 2011,Diabetes Care,2011,34  (Suppl 1):S11-S61.    Urinalysis    Component Value Date/Time   COLORURINE YELLOW (A) 06/23/2016 1529   APPEARANCEUR CLEAR (  A) 06/23/2016 1529   APPEARANCEUR Clear 11/08/2014 1559   LABSPEC 1.009 06/23/2016 1529   LABSPEC 1.010 11/08/2014 1559   PHURINE 8.0 06/23/2016 1529   GLUCOSEU NEGATIVE 06/23/2016 1529   GLUCOSEU Negative 11/08/2014 1559   HGBUR NEGATIVE 06/23/2016 1529   BILIRUBINUR NEGATIVE 06/23/2016 1529   BILIRUBINUR Negative 11/08/2014 1559   KETONESUR NEGATIVE 06/23/2016 1529   PROTEINUR >=300 (A) 06/23/2016 1529   UROBILINOGEN 1.0 10/28/2010 1100   NITRITE NEGATIVE 06/23/2016 1529   LEUKOCYTESUR NEGATIVE 06/23/2016 1529   LEUKOCYTESUR Negative 11/08/2014 1559    Pertinent Imaging: independatnly reviewed CT x 3 and [place] x1 per HPI  Assessment & Plan:   1. Chronic renal failure syndrome, stage 4 (severe) (HCC) - medical renal disease. No evidence of obstructive component. He is near dialysis. Per nephrology.   2. Prostate cancer screening - DRE today normal. NO indication for PSA based screening given level of comorbidity  3. Acquired cyst of kidney - this is acquired renal cstic disease due to CKD. No complexity to suggest malignancy or mass effect. NO futher evalution or surveillane warranted.   4. Urinary urgency - stable on terazosin, agree with this for dual indication LUTS and HTN  RTC Urol PRN   Alexis Frock, MD  [place] [address] [place], [place] [address] [phone]

## 2018-01-04 IMAGING — US US CAROTID DUPLEX BILAT
1 series · 13 of 24 positions shown · non-contrast
Comparison: None.

CLINICAL DATA: Left-sided weakness, hypertension, visual
disturbance, smoker, coronary disease

EXAM:
BILATERAL CAROTID DUPLEX ULTRASOUND
TECHNIQUE: Gray scale imaging, color Doppler and duplex ultrasound were
performed of bilateral carotid and vertebral arteries in the neck.

[Series 1: us carotid duplex bilat · 0.07mm/px · 13 of 62 slices shown]
[im 1/62]
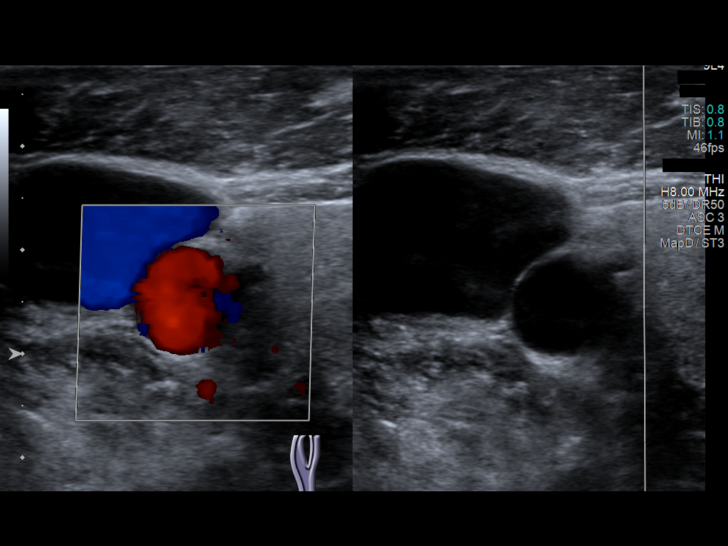
[im 6/62]
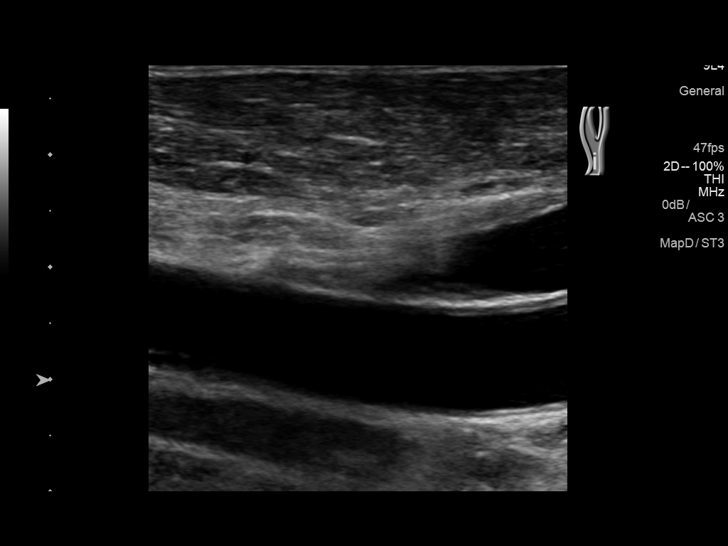
[im 11/62]
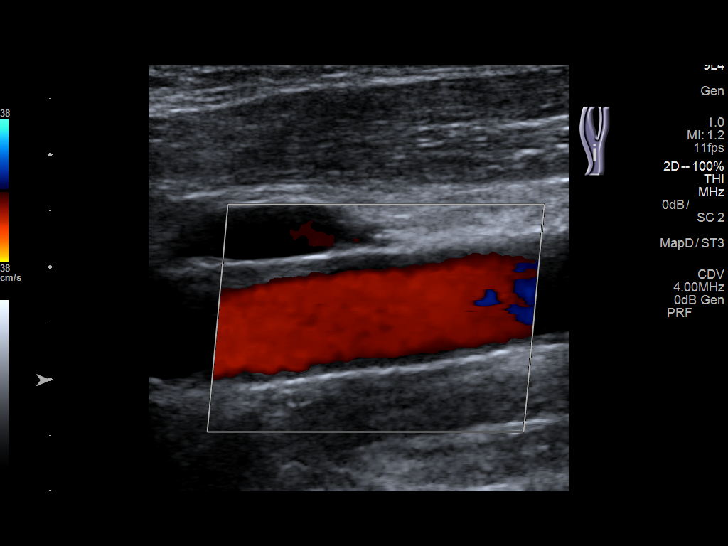
[im 16/62]
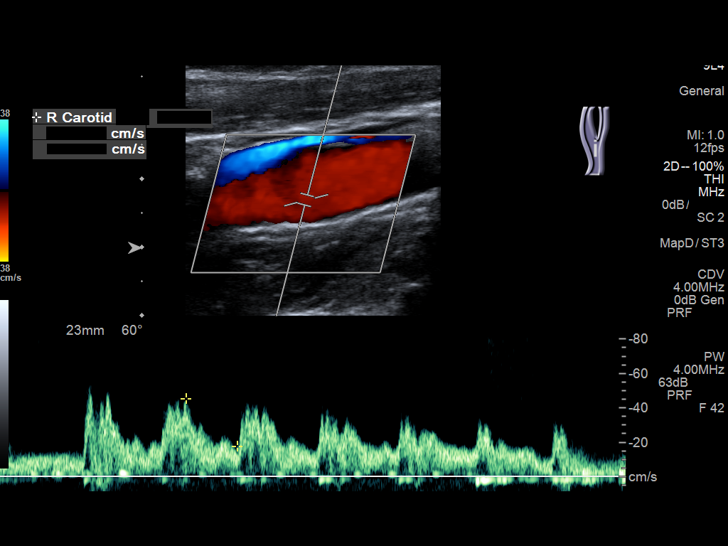
[im 22/62]
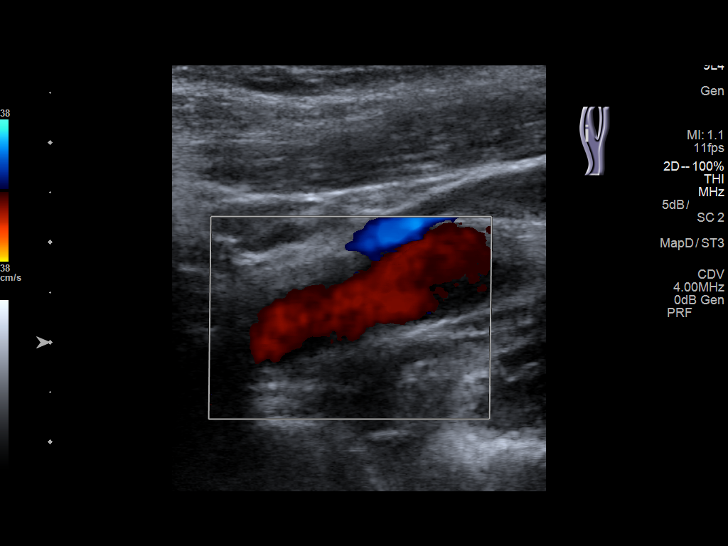
[im 27/62]
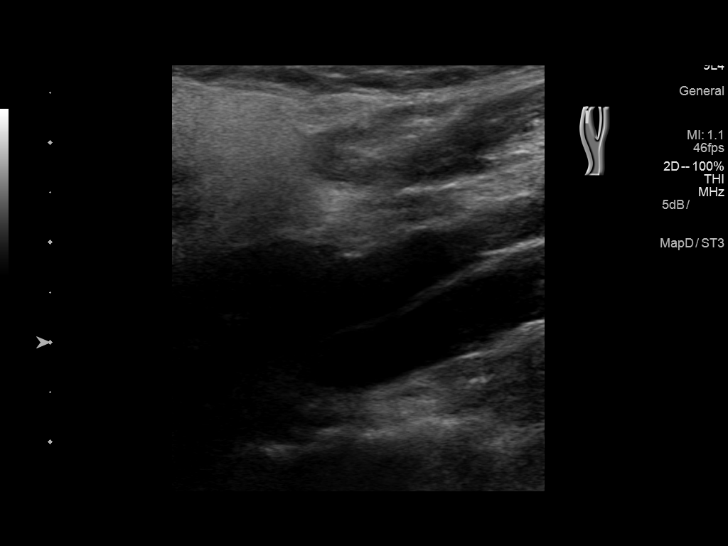
[im 32/62]
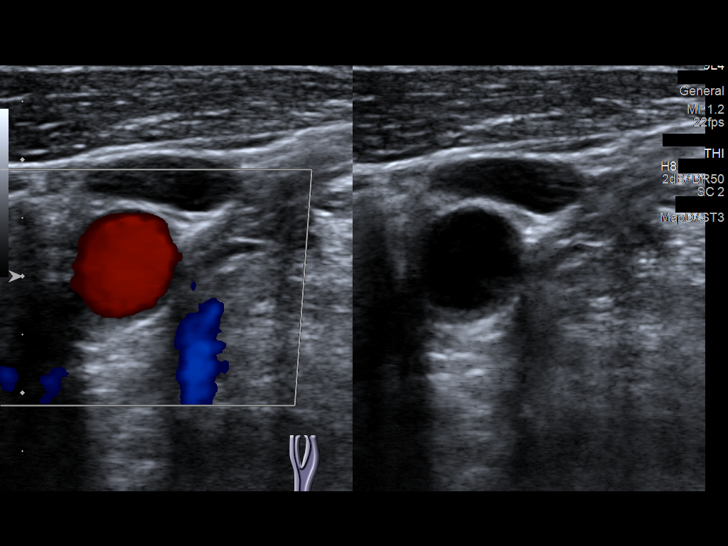
[im 35/62]
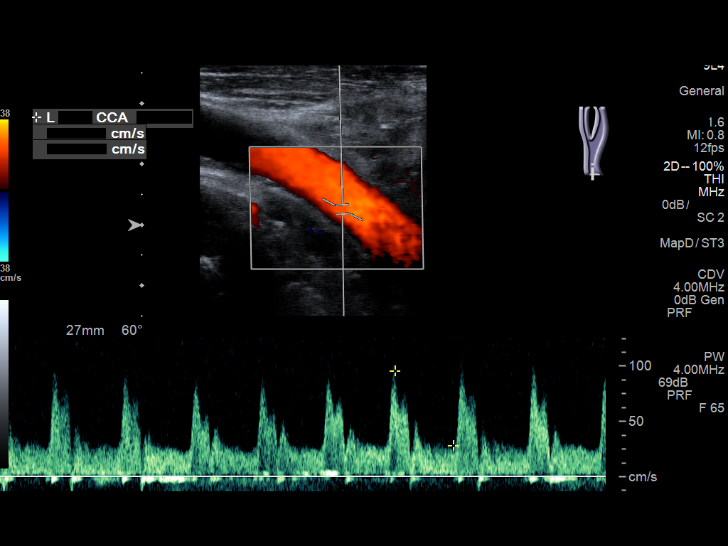
[im 40/62]
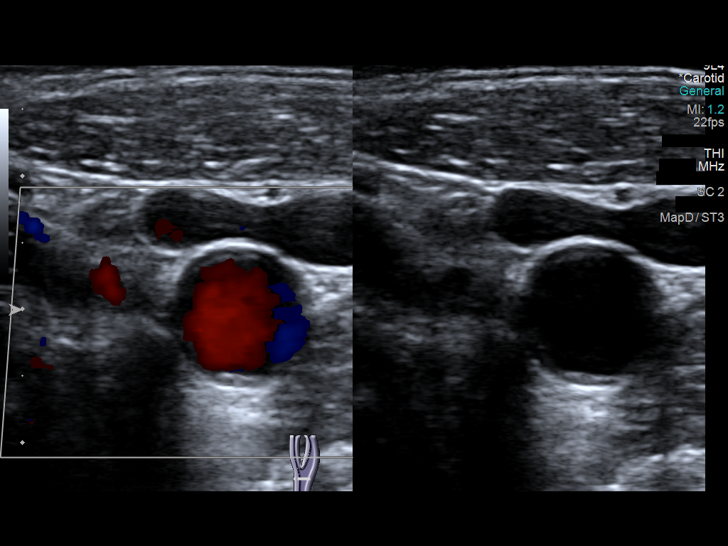
[im 46/62]
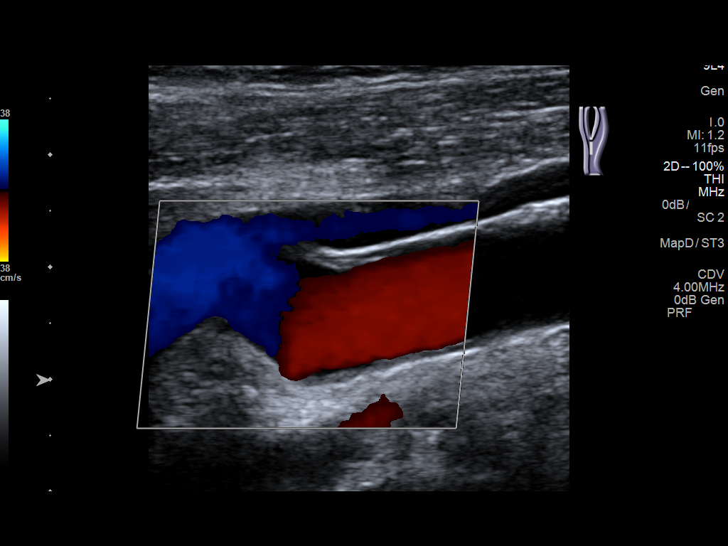
[im 51/62]
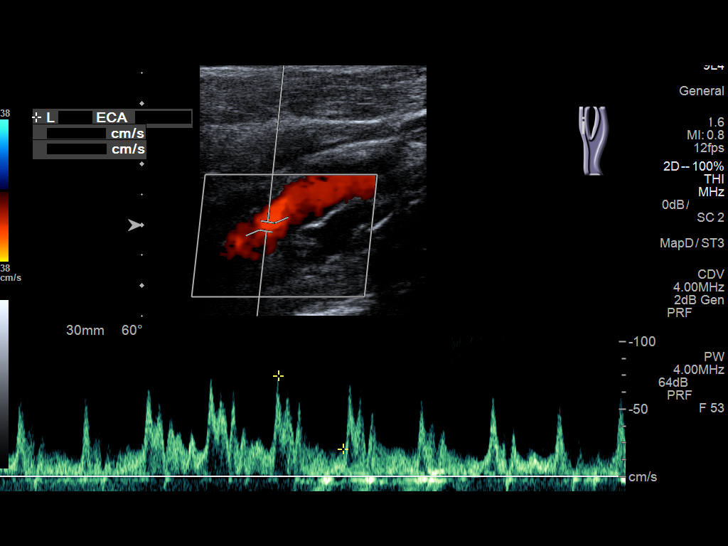
[im 56/62]
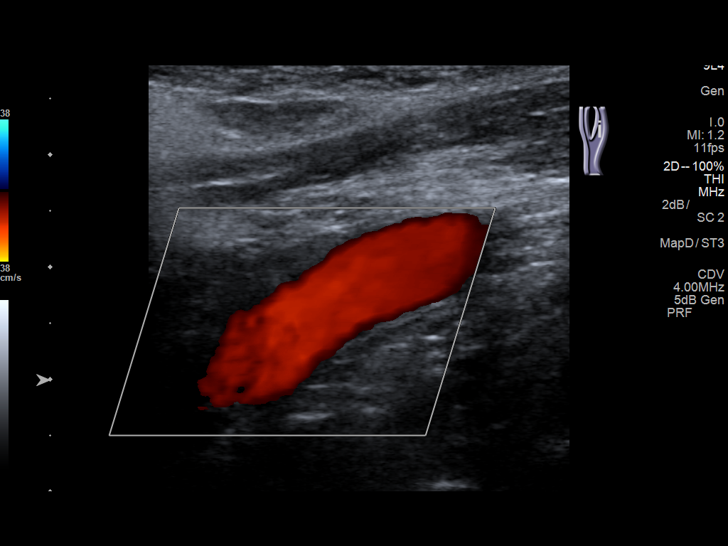
[im 62/62]
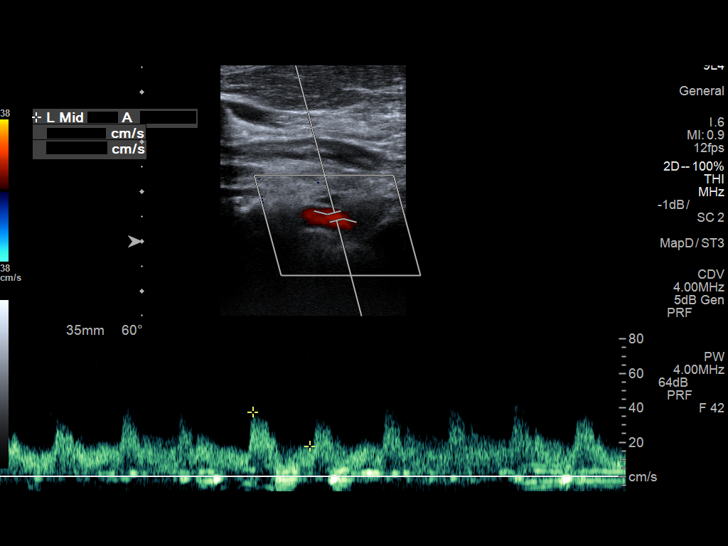

[13 of 24 positions shown; findings below may reference images not displayed]

FINDINGS: Criteria: Quantification of carotid stenosis is based on velocity
parameters that correlate the residual internal carotid diameter
with NASCET-based stenosis levels, using the diameter of the distal
internal carotid lumen as the denominator for stenosis measurement.

The following velocity measurements were obtained:

RIGHT

ICA:  62/29 cm/sec

CCA:  69/28 cm/sec

SYSTOLIC ICA/CCA RATIO:

DIASTOLIC ICA/CCA RATIO:

ECA:  72 cm/sec

LEFT

ICA:  63/33 cm/sec

CCA:  86/26 cm/sec

SYSTOLIC ICA/CCA RATIO:

DIASTOLIC ICA/CCA RATIO:

ECA:  75 cm/sec

RIGHT CAROTID ARTERY: No significant atherosclerotic plaque
formation. No hemodynamically significant right ICA stenosis,
velocity elevation, or turbulent flow. Degree of narrowing less than
50%.

RIGHT VERTEBRAL ARTERY:  Antegrade

LEFT CAROTID ARTERY: Minor atherosclerotic echogenic plaque
formation. No hemodynamically significant left ICA stenosis,
velocity elevation, or turbulent flow.

LEFT VERTEBRAL ARTERY:  Antegrade
IMPRESSION: Minor left carotid atherosclerosis. No hemodynamically significant
ICA stenosis. Degree of narrowing is less than 50% bilaterally.

Patent antegrade vertebral flow bilaterally

## 2018-08-14 ENCOUNTER — Encounter: Payer: Self-pay | Admitting: Emergency Medicine

## 2018-08-14 ENCOUNTER — Emergency Department: Payer: Medicare Other

## 2018-08-14 ENCOUNTER — Observation Stay
Admission: EM | Admit: 2018-08-14 | Discharge: 2018-08-18 | Disposition: A | Discharge status: Home / Self Care | Payer: Medicare Other | Attending: Internal Medicine | Admitting: Internal Medicine

## 2018-08-14 ENCOUNTER — Other Ambulatory Visit: Payer: Self-pay

## 2018-08-14 DIAGNOSIS — Z9115 Patient's noncompliance with renal dialysis: Secondary | ICD-10-CM | POA: Insufficient documentation

## 2018-08-14 DIAGNOSIS — F1410 Cocaine abuse, uncomplicated: Secondary | ICD-10-CM | POA: Diagnosis not present

## 2018-08-14 DIAGNOSIS — I120 Hypertensive chronic kidney disease with stage 5 chronic kidney disease or end stage renal disease: Principal | ICD-10-CM | POA: Insufficient documentation

## 2018-08-14 DIAGNOSIS — Z79891 Long term (current) use of opiate analgesic: Secondary | ICD-10-CM | POA: Insufficient documentation

## 2018-08-14 DIAGNOSIS — E785 Hyperlipidemia, unspecified: Secondary | ICD-10-CM | POA: Diagnosis not present

## 2018-08-14 DIAGNOSIS — R079 Chest pain, unspecified: Secondary | ICD-10-CM

## 2018-08-14 DIAGNOSIS — Z79899 Other long term (current) drug therapy: Secondary | ICD-10-CM | POA: Diagnosis not present

## 2018-08-14 DIAGNOSIS — F17210 Nicotine dependence, cigarettes, uncomplicated: Secondary | ICD-10-CM | POA: Diagnosis not present

## 2018-08-14 DIAGNOSIS — Z86711 Personal history of pulmonary embolism: Secondary | ICD-10-CM | POA: Insufficient documentation

## 2018-08-14 DIAGNOSIS — N186 End stage renal disease: Secondary | ICD-10-CM | POA: Diagnosis not present

## 2018-08-14 DIAGNOSIS — I10 Essential (primary) hypertension: Secondary | ICD-10-CM | POA: Diagnosis present

## 2018-08-14 DIAGNOSIS — I252 Old myocardial infarction: Secondary | ICD-10-CM | POA: Diagnosis not present

## 2018-08-14 DIAGNOSIS — Z7982 Long term (current) use of aspirin: Secondary | ICD-10-CM | POA: Insufficient documentation

## 2018-08-14 DIAGNOSIS — Z992 Dependence on renal dialysis: Secondary | ICD-10-CM | POA: Diagnosis not present

## 2018-08-14 LAB — BASIC METABOLIC PANEL
Anion gap: 13 (ref 5–15)
BUN: 76 mg/dL — AB (ref 6–20)
CO2: 25 mmol/L (ref 22–32)
Calcium: 7.6 mg/dL — ABNORMAL LOW (ref 8.9–10.3)
Chloride: 100 mmol/L (ref 98–111)
Creatinine, Ser: 11.3 mg/dL — ABNORMAL HIGH (ref 0.61–1.24)
GFR calc Af Amer: 5 mL/min — ABNORMAL LOW (ref >60)
GFR calc non Af Amer: 4 mL/min — ABNORMAL LOW (ref >60)
Glucose, Bld: 99 mg/dL (ref 70–99)
Potassium: 4.3 mmol/L (ref 3.5–5.1)
Sodium: 138 mmol/L (ref 135–145)

## 2018-08-14 LAB — TROPONIN I: Troponin I: 0.12 ng/mL (ref <0.03)

## 2018-08-14 LAB — CBC WITH DIFFERENTIAL/PLATELET
Abs Immature Granulocytes: 0.06 10*3/uL (ref 0.00–0.07)
Basophils Absolute: 0 10*3/uL (ref 0.0–0.1)
Basophils Relative: 1 %
Eosinophils Absolute: 0.1 10*3/uL (ref 0.0–0.5)
Eosinophils Relative: 1 %
HCT: 29.3 % — ABNORMAL LOW (ref 39.0–52.0)
Hemoglobin: 9.8 g/dL — ABNORMAL LOW (ref 13.0–17.0)
Immature Granulocytes: 1 %
Lymphocytes Relative: 23 %
Lymphs Abs: 1.4 10*3/uL (ref 0.7–4.0)
MCH: 28.7 pg (ref 26.0–34.0)
MCHC: 33.4 g/dL (ref 30.0–36.0)
MCV: 85.9 fL (ref 80.0–100.0)
Monocytes Absolute: 0.7 10*3/uL (ref 0.1–1.0)
Monocytes Relative: 11 %
Neutro Abs: 4 10*3/uL (ref 1.7–7.7)
Neutrophils Relative %: 63 %
Platelets: 120 10*3/uL — ABNORMAL LOW (ref 150–400)
RBC: 3.41 MIL/uL — ABNORMAL LOW (ref 4.22–5.81)
RDW: 15.3 % (ref 11.5–15.5)
WBC: 6.2 10*3/uL (ref 4.0–10.5)
nRBC: 0 % (ref 0.0–0.2)

## 2018-08-14 LAB — GLUCOSE, CAPILLARY: GLUCOSE-CAPILLARY: 104 mg/dL — AB (ref 70–99)

## 2018-08-14 NOTE — H&P (Signed)
[facility] NAME: [name]    MR#:  [id number]  DATE OF BIRTH:  Sep 04, 1955  DATE OF ADMISSION:  08/14/2018  PRIMARY CARE PHYSICIAN: Patient, No Pcp Per   REQUESTING/REFERRING PHYSICIAN: [place]  CHIEF COMPLAINT:   Chief Complaint  Patient presents with  . Chest Pain    HISTORY OF PRESENT ILLNESS:  [name]  is a 63 y.o. male who presents with chief complaint as above.  Patient presents to the ED with a complaint of some chest pain.  He states that he is a dialysis patient and has missed several sessions of dialysis.  He also states that he has been using some substances recently including cocaine.  Here in the ED his troponin was elevated at 0.12.  Hospitalist were called for admission  PAST MEDICAL HISTORY:   Past Medical History:  Diagnosis Date  . Depression   . Heart attack ([place])   . Hypertension   . PE (pulmonary embolism)   . Renal disorder      PAST SURGICAL HISTORY:   Past Surgical History:  Procedure Laterality Date  . TOTAL HIP ARTHROPLASTY       SOCIAL HISTORY:   Social History   Tobacco Use  . Smoking status: Current Every Day Smoker    Packs/day: 0.50    Types: Cigarettes  . Smokeless tobacco: Never Used  Substance Use Topics  . Alcohol use: No     FAMILY HISTORY:    Family history reviewed and is non-contributory DRUG ALLERGIES:   Allergies  Allergen Reactions  . Cyclobenzaprine Nausea Only  . Clonidine Other (See Comments)    Asymptomatic bradycardia to 38  . Furosemide Other (See Comments)    Caused renal failure  . Tylenol [Acetaminophen] Other (See Comments)    Reaction:  Pt states it bothers his liver    MEDICATIONS AT HOME:   Prior to Admission medications   Medication Sig Start Date End Date Taking? Authorizing Provider  amLODipine (NORVASC) 10 MG tablet Take 10 mg by mouth daily. 11/27/15  Yes [provider]  aspirin EC 81 MG tablet  Take 81 mg by mouth daily.   Yes [provider]  atorvastatin (LIPITOR) 40 MG tablet Take 40 mg by mouth daily. 11/27/15  Yes [provider]  clopidogrel (PLAVIX) 75 MG tablet Take 75 mg by mouth daily. 06/21/16  Yes [provider]  hydrALAZINE (APRESOLINE) 100 MG tablet Take 1 tablet (100 mg total) by mouth 3 (three) times daily. 09/27/16  Yes Gladstone Lighter, MD  isosorbide mononitrate (IMDUR) 60 MG 24 hr tablet Take 1 tablet (60 mg total) by mouth daily. 09/28/16  Yes Gladstone Lighter, MD  labetalol (NORMODYNE) 300 MG tablet Take 1 tablet (300 mg total) by mouth 3 (three) times daily. 09/27/16  Yes Gladstone Lighter, MD  oxyCODONE (OXYCONTIN) 20 mg 12 hr tablet Take 20 mg by mouth every 12 (twelve) hours. 04/11/16  Yes [provider]  Oxycodone HCl 10 MG TABS Take 10-20 mg by mouth every 4 (four) hours as needed (for pain).  04/11/16  Yes [provider]  polyethylene glycol (MIRALAX / GLYCOLAX) packet Take 17 g by mouth daily as needed for mild constipation.  11/27/15  Yes [provider]  senna-docusate (SENOKOT-S) 8.6-50 MG tablet Take 1 tablet by mouth 2 (two) times daily. 09/27/16  Yes Gladstone Lighter, MD  sucralfate (CARAFATE) 1 g tablet Take 1 tablet (1 g total) by mouth 4 (  four) times daily. 05/29/16  Yes Carrie Mew, MD  tamsulosin (FLOMAX) 0.4 MG CAPS capsule Take 1 capsule (0.4 mg total) by mouth daily. 09/27/16  Yes Gladstone Lighter, MD  terazosin (HYTRIN) 5 MG capsule Take 1 capsule (5 mg total) by mouth at bedtime. 09/27/16  Yes Gladstone Lighter, MD  traZODone (DESYREL) 100 MG tablet Take 100 mg by mouth at bedtime as needed for sleep.  10/22/15  Yes [provider]    REVIEW OF SYSTEMS:  Review of Systems  Constitutional: Negative for chills, fever, malaise/fatigue and weight loss.  HENT: Negative for ear pain, hearing loss and tinnitus.   Eyes: Negative for blurred vision, double vision, pain and  redness.  Respiratory: Negative for cough, hemoptysis and shortness of breath.   Cardiovascular: Positive for chest pain. Negative for palpitations, orthopnea and leg swelling.  Gastrointestinal: Negative for abdominal pain, constipation, diarrhea, nausea and vomiting.  Genitourinary: Negative for dysuria, frequency and hematuria.  Musculoskeletal: Negative for back pain, joint pain and neck pain.  Skin:       No acne, rash, or lesions  Neurological: Negative for dizziness, tremors, focal weakness and weakness.  Endo/Heme/Allergies: Negative for polydipsia. Does not bruise/bleed easily.  Psychiatric/Behavioral: Negative for depression. The patient is not nervous/anxious and does not have insomnia.      VITAL SIGNS:   Vitals:   08/14/18 2200 08/14/18 2215 08/14/18 2300 08/14/18 2330  BP: (!) 151/94  (!) 160/107 (!) 156/106  Pulse: 84 83 83 80  Resp: 18 13 20    Temp:      TempSrc:      SpO2: 98% 100% 99% 93%  Weight:      Height:       Wt Readings from Last 3 Encounters:  08/14/18 127 kg  10/05/16 126.1 kg  09/27/16 127.6 kg    PHYSICAL EXAMINATION:  Physical Exam  Vitals reviewed. Constitutional: He is oriented to person, place, and time. He appears well-developed and well-nourished. No distress.  HENT:  Head: Normocephalic and atraumatic.  Mouth/Throat: Oropharynx is clear and moist.  Eyes: Pupils are equal, round, and reactive to light. Conjunctivae and EOM are normal. No scleral icterus.  Neck: Normal range of motion. Neck supple. No JVD present. No thyromegaly present.  Cardiovascular: Normal rate, regular rhythm and intact distal pulses. Exam reveals no gallop and no friction rub.  No murmur heard. Respiratory: Effort normal and breath sounds normal. No respiratory distress. He has no wheezes. He has no rales.  GI: Soft. Bowel sounds are normal. He exhibits no distension. There is no abdominal tenderness.  Musculoskeletal: Normal range of motion.        General: No  edema.     Comments: No arthritis, no gout  Lymphadenopathy:    He has no cervical adenopathy.  Neurological: He is alert and oriented to person, place, and time. No cranial nerve deficit.  No dysarthria, no aphasia  Skin: Skin is warm and dry. No rash noted. No erythema.  Psychiatric: He has a normal mood and affect. His behavior is normal. Judgment and thought content normal.    LABORATORY PANEL:   CBC Recent Labs  Lab 08/14/18 2115  WBC 6.2  HGB 9.8*  HCT 29.3*  PLT 120*   ------------------------------------------------------------------------------------------------------------------  Chemistries  Recent Labs  Lab 08/14/18 2115  NA 138  K 4.3  CL 100  CO2 25  GLUCOSE 99  BUN 76*  CREATININE 11.30*  CALCIUM 7.6*   ------------------------------------------------------------------------------------------------------------------  Cardiac Enzymes Recent Labs  Lab  08/14/18 2115  TROPONINI 0.12*   ------------------------------------------------------------------------------------------------------------------  RADIOLOGY:  Dg Chest 1 View  Result Date: 08/14/2018 CLINICAL DATA:  Shortness of breath. Patient has not had dialysis in 2 weeks and is not compliant with medication. EXAM: CHEST  1 VIEW COMPARISON:  10/05/2016 FINDINGS: Shallow inspiration. Cardiac enlargement. No significant vascular congestion or edema. Suggestion of a small cavitary lesion in the right mid lung measuring about 1.6 cm diameter. This was not present on previous study. Consider CT for further evaluation. Lungs are otherwise clear. No focal consolidation. No blunting of costophrenic angles. No pneumothorax. Mediastinal contours appear intact. IMPRESSION: Cardiac enlargement. Suggestion of cavitary lesion in the right mid lung. Consider CT for further evaluation. Electronically Signed   By: Lucienne Capers M.D.   On: 08/14/2018 21:55   Ct Chest Wo Contrast  Result Date: 08/14/2018 CLINICAL  DATA:  63 year old male with chest pain. Possible mid right lung nodule or cavitary lesion on portable chest today. Has not had dialysis in 2 weeks. EXAM: CT CHEST WITHOUT CONTRAST TECHNIQUE: Multidetector CT imaging of the chest was performed following the standard protocol without IV contrast. COMPARISON:  Portable chest 2135 hours. CT Abdomen and Pelvis 06/23/2016. FINDINGS: Cardiovascular: Cardiomegaly has increased since 2017. No pericardial effusion. Calcified coronary artery atherosclerosis (series 2, image 72). Vascular patency is not evaluated in the absence of IV contrast. Mediastinum/Nodes: Negative.  No mediastinal lymphadenopathy. Lungs/Pleura: Mild elevation of the right hemidiaphragm is chronic. There is no right lung nodule or mass. No right pleural effusion. Mild upper lobe centrilobular emphysema. Chronic mild paraseptal emphysema in the left costophrenic angle is stable since 2017. Mild left lung atelectasis. No left pleural effusion. Major airways are patent. Upper Abdomen: Mild respiratory motion in the upper abdomen. Chronic hypertrophy of the left hepatic lobe. The gallbladder is more contracted today. Stable and negative visible noncontrast spleen, left adrenal gland, bowel in the upper abdomen. Musculoskeletal: No right rib lesion to explain the earlier portable chest density. No acute or suspicious osseous abnormality. IMPRESSION: 1. No right lung lesion, cavitary or otherwise. Suspect artifact on the portable chest x-ray earlier today. 2. There is mild Emphysema, but no acute pulmonary process. 3. Cardiomegaly which has progressed since 2017. No pericardial effusion. 4. Chronic hypertrophy of the left hepatic lobe. Consider the possibility of Cirrhosis. Electronically Signed   By: Genevie Ann M.D.   On: 08/14/2018 22:48    EKG:   Orders placed or performed during the hospital encounter of 08/14/18  . EKG 12-Lead  . EKG 12-Lead    IMPRESSION AND PLAN:  Principal Problem:   Chest  pain -currently resolved.  Troponin is elevated, though this is in the setting of end-stage renal disease on dialysis and recent cocaine usage.  We will trend his cardiac enzyme, and pursue further treatment and work-up if it rises significantly.   Active Problems:   ESRD on dialysis [facility]) -patient needs to be set up with dialysis as he has not lived in this area for some time recently.  We will get a nephrology consult to arrange for dialysis while he is here.   Hypertension -continue home meds   HLD (hyperlipidemia) -Home dose antilipid  Chart review performed and case discussed with ED provider. Labs, imaging and/or ECG reviewed by provider and discussed with patient/family. Management plans discussed with the patient and/or family.  DVT PROPHYLAXIS: SubQ heparin  GI PROPHYLAXIS:  None  ADMISSION STATUS: Observation  CODE STATUS: Full Code Status History    Date Active  Date Inactive Code Status Order ID Comments User Context   09/21/2016 1519 09/27/2016 2042 Full Code [id number]  Hillary Bow, MD ED   04/28/2016 2325 04/29/2016 2043 Full Code [id number]  [facility] ED   01/18/2016 0202 01/18/2016 1848 Full Code [id number]  Quintella Baton, MD Inpatient      TOTAL TIME TAKING CARE OF THIS PATIENT: 40 minutes.   Ethlyn Daniels 08/14/2018, 11:54 PM  [facility]  Office  [phone]  CC: Primary care physician; Patient, No Pcp Per  Note:  This document was prepared using Dragon voice recognition software and may include unintentional dictation errors.

## 2018-08-14 NOTE — ED Triage Notes (Signed)
Pt reports he has not had dialysis in 2 weeks and has not been compliant with medication. Pt is poor historian. Pt has hx/o hypertension. Pt taken to RM 7. Pt reports he has been in [place] and is in transition of moving back here.

## 2018-08-14 NOTE — ED Notes (Signed)
EDP made aware of critical Troponin 0.12. No new orders at this time.

## 2018-08-14 NOTE — ED Notes (Signed)
Patient transferred to CT

## 2018-08-14 NOTE — ED Provider Notes (Signed)
[facility] Emergency Department Provider Note  ____________________________________________   First MD Initiated Contact with Patient 08/14/18 2112     (approximate)  I have reviewed the triage vital signs and the nursing notes.   HISTORY  Chief Complaint Chest Pain   HPI [name] is a 63 y.o. male with a history of end-stage renal disease on dialysis who has not been to dialysis in 2 weeks.  He says that he urinates daily.  States that he is also been doing cocaine, snorting, as well as drinking more fluid that he has supposed to be.  He says that he has had diffuse body aches as well as shortness of breath and a cough.  States that he is transitioning to [place] from [place] and has not established care here in [place].  Came to the emergency department today because of his family's encouragement and concerned because of his noncompliance.   Past Medical History:  Diagnosis Date  . Depression   . Heart attack ([place])   . Hypertension   . PE (pulmonary embolism)   . Renal disorder     Patient Active Problem List   Diagnosis Date Noted  . Chronic renal failure syndrome, stage 4 (severe) ([place]) 11/08/2016  . Prostate cancer screening 11/08/2016  . Acquired cyst of kidney 11/08/2016  . Urinary urgency 11/08/2016  . Acute on chronic renal failure ([place]) 09/23/2016  . Hypertensive urgency 09/21/2016  . Dysthymia 02/26/2016  . Chronic pain 02/26/2016  . Noncompliance 02/26/2016  . Opiate abuse, continuous ([place]) 02/03/2016  . Substance induced mood disorder ([place]) 02/03/2016  . Antisocial personality disorder ([place]) 02/03/2016  . Left-sided weakness 01/18/2016  . HEPATITIS C 06/28/2008  . HYPERLIPIDEMIA 06/28/2008  . SUBSTANCE ABUSE, MULTIPLE 06/28/2008  . Hypertension 06/27/2008    Past Surgical History:  Procedure Laterality Date  . TOTAL HIP ARTHROPLASTY      Prior to Admission medications   Medication Sig Start Date End  Date Taking? Authorizing Provider  amLODipine (NORVASC) 10 MG tablet Take 10 mg by mouth daily. 11/27/15  Yes [provider]  aspirin EC 81 MG tablet Take 81 mg by mouth daily.   Yes [provider]  atorvastatin (LIPITOR) 40 MG tablet Take 40 mg by mouth daily. 11/27/15  Yes [provider]  clopidogrel (PLAVIX) 75 MG tablet Take 75 mg by mouth daily. 06/21/16  Yes [provider]  hydrALAZINE (APRESOLINE) 100 MG tablet Take 1 tablet (100 mg total) by mouth 3 (three) times daily. 09/27/16  Yes Gladstone Lighter, MD  isosorbide mononitrate (IMDUR) 60 MG 24 hr tablet Take 1 tablet (60 mg total) by mouth daily. 09/28/16  Yes Gladstone Lighter, MD  labetalol (NORMODYNE) 300 MG tablet Take 1 tablet (300 mg total) by mouth 3 (three) times daily. 09/27/16  Yes Gladstone Lighter, MD  oxyCODONE (OXYCONTIN) 20 mg 12 hr tablet Take 20 mg by mouth every 12 (twelve) hours. 04/11/16  Yes [provider]  Oxycodone HCl 10 MG TABS Take 10-20 mg by mouth every 4 (four) hours as needed (for pain).  04/11/16  Yes [provider]  polyethylene glycol (MIRALAX / GLYCOLAX) packet Take 17 g by mouth daily as needed for mild constipation.  11/27/15  Yes [provider]  senna-docusate (SENOKOT-S) 8.6-50 MG tablet Take 1 tablet by mouth 2 (two) times daily. 09/27/16  Yes Gladstone Lighter, MD  sucralfate (CARAFATE) 1 g tablet Take 1 tablet (1 g total) by mouth 4 (four) times  daily. 05/29/16  Yes Carrie Mew, MD  tamsulosin (FLOMAX) 0.4 MG CAPS capsule Take 1 capsule (0.4 mg total) by mouth daily. 09/27/16  Yes Gladstone Lighter, MD  terazosin (HYTRIN) 5 MG capsule Take 1 capsule (5 mg total) by mouth at bedtime. 09/27/16  Yes Gladstone Lighter, MD  traZODone (DESYREL) 100 MG tablet Take 100 mg by mouth at bedtime as needed for sleep.  10/22/15  Yes [provider]    Allergies Cyclobenzaprine; Clonidine; Furosemide; and Tylenol  [acetaminophen]  History reviewed. No pertinent family history.  Social History Social History   Tobacco Use  . Smoking status: Current Every Day Smoker    Packs/day: 0.50    Types: Cigarettes  . Smokeless tobacco: Never Used  Substance Use Topics  . Alcohol use: No  . Drug use: No    Review of Systems  Constitutional: No fever/chills Eyes: No visual changes. ENT: No sore throat. Cardiovascular: Denies chest pain. Respiratory: As above Gastrointestinal: No abdominal pain.  No nausea, no vomiting.  No diarrhea.  No constipation. Genitourinary: Negative for dysuria. Musculoskeletal: Body aches as above Skin: Negative for rash. Neurological: Negative for headaches, focal weakness or numbness.   ____________________________________________   PHYSICAL EXAM:  VITAL SIGNS: ED Triage Vitals  Enc Vitals Group     BP 08/14/18 2109 (!) 161/94     Pulse Rate 08/14/18 2109 96     Resp 08/14/18 2109 20     Temp 08/14/18 2109 98.1 F (36.7 C)     Temp Source 08/14/18 2109 Oral     SpO2 08/14/18 2109 95 %     Weight 08/14/18 2118 280 lb (127 kg)     Height 08/14/18 2118 6' (1.829 m)     Head Circumference --      Peak Flow --      Pain Score --      Pain Loc --      Pain Edu? --      Excl. in [place]? --     Constitutional: Alert and oriented. Well appearing and in no acute distress. Eyes: Conjunctivae are normal.  Head: Atraumatic. Nose: No congestion/rhinnorhea. Mouth/Throat: Mucous membranes are moist.  Neck: No stridor.   Cardiovascular: Normal rate, regular rhythm. Grossly normal heart sounds.  Good peripheral circulation with palpable thrill to the left upper extremity fistula. Respiratory: Normal respiratory effort.  No retractions. Lungs CTAB. Gastrointestinal: Soft and nontender. No distention. Musculoskeletal: No lower extremity tenderness nor edema.  No joint effusions. Neurologic:  Normal speech and language. No gross focal neurologic deficits are  appreciated. Skin:  Skin is warm, dry and intact. No rash noted. Psychiatric: Mood and affect are normal. Speech and behavior are normal.  ____________________________________________   LABS (all labs ordered are listed, but only abnormal results are displayed)  Labs Reviewed  GLUCOSE, CAPILLARY - Abnormal; Notable for the following components:      Result Value   Glucose-Capillary 104 (*)    All other components within normal limits  CBC WITH DIFFERENTIAL/PLATELET - Abnormal; Notable for the following components:   RBC 3.41 (*)    Hemoglobin 9.8 (*)    HCT 29.3 (*)    Platelets 120 (*)    All other components within normal limits  BASIC METABOLIC PANEL - Abnormal; Notable for the following components:   BUN 76 (*)    Creatinine, Ser 11.30 (*)    Calcium 7.6 (*)    GFR calc non Af Amer 4 (*)    GFR calc Af  Amer 5 (*)    All other components within normal limits  TROPONIN I - Abnormal; Notable for the following components:   Troponin I 0.12 (*)    All other components within normal limits   ____________________________________________  EKG  ED ECG REPORT I, Doran Stabler, the attending physician, personally viewed and interpreted this ECG.   Date: 08/14/2018  EKG Time: 2056  Rate: 93  Rhythm: normal sinus rhythm  Axis: Normal  Intervals:none  ST&T Change: No ST segment elevation or depression.  Single T wave inversion in aVL  ____________________________________________  RADIOLOGY  Chest x-ray with cardiac enlargement and suggestion of cavitary lesion in the right middle lung.  CT noncontrast with no right lung lesion cavitary or otherwise.  Suspect artifact on the portable chest. ____________________________________________   PROCEDURES  Procedure(s) performed:   Procedures  Critical Care performed:   ____________________________________________   INITIAL IMPRESSION / ASSESSMENT AND PLAN / ED COURSE  Pertinent labs & imaging results that were  available during my care of the patient were reviewed by me and considered in my medical decision making (see chart for details).  DDX: Fluid overload, dialysis noncompliance, electrolyte abnormality, uremia, hyperkalemia, arrhythmia, pneumonia As part of my medical decision making, I reviewed the following data within the electronic MEDICAL RECORD NUMBER Notes from prior ED visits  ----------------------------------------- 11:32 PM on 08/14/2018 -----------------------------------------  Discussed case with Dr. Zollie Scale who says there is a possibility to get the patient in as an outpatient and that he will discuss with his admission coordinators tomorrow to see if there is a chair available at a local dialysis center.  However, I am concerned about the patient's 2-week noncompliance from dialysis and that he is using cocaine and that he will be lost to follow-up.  He will be admitted to the hospital to be dialyzed and observed.  Signed out to Dr. Jannifer Franklin.  Patient understanding the diagnosis well treatment and willing to comply. ____________________________________________   FINAL CLINICAL IMPRESSION(S) / ED DIAGNOSES  Chest pain.  Dialysis noncompliance.  NEW MEDICATIONS STARTED DURING THIS VISIT:  New Prescriptions   No medications on file     Note:  This document was prepared using Dragon voice recognition software and may include unintentional dictation errors.     Orbie Pyo, MD 08/14/18 [phone]

## 2018-08-14 NOTE — ED Notes (Signed)
Patient returned from CT

## 2018-08-14 NOTE — ED Notes (Signed)
Patient states just moved to [facility] from [place]. Patient states he is supposed to have dialysis 3 times a week and has not gone for dialysis for over 2 weeks. Patient states he is tired of dialysis but wants to have it done. Patient states he has not been feeling well and family has been urging him to seek treatment.

## 2018-08-15 ENCOUNTER — Encounter: Payer: Self-pay | Admitting: *Deleted

## 2018-08-15 LAB — BASIC METABOLIC PANEL
Anion gap: 12 (ref 5–15)
BUN: 77 mg/dL — ABNORMAL HIGH (ref 6–20)
CO2: 25 mmol/L (ref 22–32)
Calcium: 7.3 mg/dL — ABNORMAL LOW (ref 8.9–10.3)
Chloride: 101 mmol/L (ref 98–111)
Creatinine, Ser: 11.3 mg/dL — ABNORMAL HIGH (ref 0.61–1.24)
GFR calc Af Amer: 5 mL/min — ABNORMAL LOW (ref >60)
GFR calc non Af Amer: 4 mL/min — ABNORMAL LOW (ref >60)
Glucose, Bld: 117 mg/dL — ABNORMAL HIGH (ref 70–99)
Potassium: 4 mmol/L (ref 3.5–5.1)
Sodium: 138 mmol/L (ref 135–145)

## 2018-08-15 LAB — CBC
HCT: 27.9 % — ABNORMAL LOW (ref 39.0–52.0)
Hemoglobin: 9.2 g/dL — ABNORMAL LOW (ref 13.0–17.0)
MCH: 28.3 pg (ref 26.0–34.0)
MCHC: 33 g/dL (ref 30.0–36.0)
MCV: 85.8 fL (ref 80.0–100.0)
Platelets: 107 10*3/uL — ABNORMAL LOW (ref 150–400)
RBC: 3.25 MIL/uL — ABNORMAL LOW (ref 4.22–5.81)
RDW: 15.1 % (ref 11.5–15.5)
WBC: 5.8 10*3/uL (ref 4.0–10.5)
nRBC: 0 % (ref 0.0–0.2)

## 2018-08-15 LAB — TROPONIN I
Troponin I: 0.12 ng/mL (ref <0.03)
Troponin I: 0.11 ng/mL (ref <0.03)

## 2018-08-15 LAB — MRSA PCR SCREENING: MRSA by PCR: NEGATIVE

## 2018-08-15 LAB — PHOSPHORUS: Phosphorus: 8 mg/dL — ABNORMAL HIGH (ref 2.5–4.6)

## 2018-08-15 MED ORDER — HEPARIN SODIUM (PORCINE) 5000 UNIT/ML IJ SOLN
5000.0000 [IU] | Freq: Three times a day (TID) | INTRAMUSCULAR | Status: DC
  Administered 2018-08-15 – 2018-08-18 (×9): 5000 [IU] via SUBCUTANEOUS
  Filled 2018-08-15 (×9): qty 1

## 2018-08-15 MED ORDER — TERAZOSIN HCL 5 MG PO CAPS
5.0000 mg | ORAL_CAPSULE | Freq: Every day | ORAL | Status: DC
  Administered 2018-08-15 – 2018-08-18 (×4): 5 mg via ORAL
  Filled 2018-08-15 (×5): qty 1

## 2018-08-15 MED ORDER — NICOTINE 14 MG/24HR TD PT24
14.0000 mg | MEDICATED_PATCH | Freq: Every day | TRANSDERMAL | Status: DC
  Administered 2018-08-15 – 2018-08-18 (×4): 14 mg via TRANSDERMAL
  Filled 2018-08-15 (×4): qty 1

## 2018-08-15 MED ORDER — SODIUM CHLORIDE 0.9 % IV SOLN: 100.0000 mL | INTRAVENOUS | Status: DC | PRN

## 2018-08-15 MED ORDER — ISOSORBIDE MONONITRATE ER 60 MG PO TB24
60.0000 mg | ORAL_TABLET | Freq: Every day | ORAL | Status: DC
  Administered 2018-08-15 – 2018-08-18 (×4): 60 mg via ORAL
  Filled 2018-08-15 (×4): qty 1

## 2018-08-15 MED ORDER — HYDRALAZINE HCL 50 MG PO TABS
100.0000 mg | ORAL_TABLET | Freq: Three times a day (TID) | ORAL | Status: DC
  Administered 2018-08-15 – 2018-08-18 (×7): 100 mg via ORAL
  Filled 2018-08-15 (×7): qty 2

## 2018-08-15 MED ORDER — SEVELAMER CARBONATE 800 MG PO TABS
1600.0000 mg | ORAL_TABLET | Freq: Three times a day (TID) | ORAL | Status: DC
  Administered 2018-08-15 – 2018-08-17 (×6): 1600 mg via ORAL
  Filled 2018-08-15 (×6): qty 2

## 2018-08-15 MED ORDER — LIDOCAINE VISCOUS HCL 2 % MT SOLN
15.0000 mL | Freq: Once | OROMUCOSAL | Status: AC
  Administered 2018-08-15: 15 mL via ORAL
  Filled 2018-08-15: qty 15

## 2018-08-15 MED ORDER — ALUM & MAG HYDROXIDE-SIMETH 200-200-20 MG/5ML PO SUSP
30.0000 mL | Freq: Once | ORAL | Status: AC
  Administered 2018-08-15: 30 mL via ORAL
  Filled 2018-08-15: qty 30

## 2018-08-15 MED ORDER — ATORVASTATIN CALCIUM 20 MG PO TABS
40.0000 mg | ORAL_TABLET | Freq: Every day | ORAL | Status: DC
  Administered 2018-08-15 – 2018-08-18 (×4): 40 mg via ORAL
  Filled 2018-08-15 (×4): qty 2

## 2018-08-15 MED ORDER — AMLODIPINE BESYLATE 10 MG PO TABS
10.0000 mg | ORAL_TABLET | Freq: Every day | ORAL | Status: DC
  Administered 2018-08-16 – 2018-08-18 (×2): 10 mg via ORAL
  Filled 2018-08-15 (×2): qty 1

## 2018-08-15 MED ORDER — CHLORHEXIDINE GLUCONATE CLOTH 2 % EX PADS
6.0000 | MEDICATED_PAD | Freq: Every day | CUTANEOUS | Status: DC
  Administered 2018-08-15: 6 via TOPICAL

## 2018-08-15 MED ORDER — TAMSULOSIN HCL 0.4 MG PO CAPS
0.4000 mg | ORAL_CAPSULE | Freq: Every day | ORAL | Status: DC
  Administered 2018-08-15: 0.4 mg via ORAL
  Filled 2018-08-15: qty 1

## 2018-08-15 MED ORDER — LABETALOL HCL 200 MG PO TABS
300.0000 mg | ORAL_TABLET | Freq: Three times a day (TID) | ORAL | Status: DC
  Administered 2018-08-15 – 2018-08-18 (×7): 300 mg via ORAL
  Filled 2018-08-15 (×12): qty 1

## 2018-08-15 MED ORDER — LIDOCAINE HCL (PF) 1 % IJ SOLN
5.0000 mL | INTRAMUSCULAR | Status: DC | PRN
  Filled 2018-08-15: qty 5

## 2018-08-15 MED ORDER — SEVELAMER CARBONATE 800 MG PO TABS: 1600.0000 mg | ORAL_TABLET | Freq: Three times a day (TID) | ORAL | 0 refills | Status: DC

## 2018-08-15 MED ORDER — HEPARIN SODIUM (PORCINE) 1000 UNIT/ML DIALYSIS: 1000.0000 [IU] | INTRAMUSCULAR | Status: DC | PRN

## 2018-08-15 MED ORDER — ACETAMINOPHEN 650 MG RE SUPP: 650.0000 mg | Freq: Four times a day (QID) | RECTAL | Status: DC | PRN

## 2018-08-15 MED ORDER — ONDANSETRON HCL 4 MG/2ML IJ SOLN
4.0000 mg | Freq: Four times a day (QID) | INTRAMUSCULAR | Status: DC | PRN
  Administered 2018-08-16: 4 mg via INTRAVENOUS
  Filled 2018-08-15: qty 2

## 2018-08-15 MED ORDER — PENTAFLUOROPROP-TETRAFLUOROETH EX AERO
1.0000 "application " | INHALATION_SPRAY | CUTANEOUS | Status: DC | PRN
  Filled 2018-08-15: qty 30

## 2018-08-15 MED ORDER — TRAZODONE HCL 100 MG PO TABS: 100.0000 mg | ORAL_TABLET | Freq: Every evening | ORAL | Status: DC | PRN

## 2018-08-15 MED ORDER — OXYCODONE HCL 5 MG PO TABS
10.0000 mg | ORAL_TABLET | ORAL | Status: DC | PRN
  Administered 2018-08-15 – 2018-08-16 (×8): 10 mg via ORAL
  Filled 2018-08-15 (×8): qty 2

## 2018-08-15 MED ORDER — ONDANSETRON HCL 4 MG PO TABS: 4.0000 mg | ORAL_TABLET | Freq: Four times a day (QID) | ORAL | Status: DC | PRN

## 2018-08-15 MED ORDER — ALTEPLASE 2 MG IJ SOLR: 2.0000 mg | Freq: Once | INTRAMUSCULAR | Status: DC | PRN

## 2018-08-15 MED ORDER — ACETAMINOPHEN 325 MG PO TABS: 650.0000 mg | ORAL_TABLET | Freq: Four times a day (QID) | ORAL | Status: DC | PRN

## 2018-08-15 MED ORDER — OXYCODONE HCL ER 20 MG PO T12A
20.0000 mg | EXTENDED_RELEASE_TABLET | Freq: Two times a day (BID) | ORAL | Status: DC
  Administered 2018-08-15 – 2018-08-18 (×8): 20 mg via ORAL
  Filled 2018-08-15 (×8): qty 1

## 2018-08-15 MED ORDER — LIDOCAINE-PRILOCAINE 2.5-2.5 % EX CREA
1.0000 "application " | TOPICAL_CREAM | CUTANEOUS | Status: DC | PRN
  Filled 2018-08-15: qty 5

## 2018-08-15 MED ORDER — TAMSULOSIN HCL 0.4 MG PO CAPS
0.4000 mg | ORAL_CAPSULE | Freq: Every day | ORAL | Status: DC
  Administered 2018-08-15 – 2018-08-17 (×3): 0.4 mg via ORAL
  Filled 2018-08-15 (×4): qty 1

## 2018-08-15 MED ORDER — ASPIRIN EC 81 MG PO TBEC
81.0000 mg | DELAYED_RELEASE_TABLET | Freq: Every day | ORAL | Status: DC
  Administered 2018-08-15 – 2018-08-17 (×3): 81 mg via ORAL
  Filled 2018-08-15 (×4): qty 1

## 2018-08-15 NOTE — Progress Notes (Signed)
Pt requesting nicotine patch, pt smokes about a 1/2-pack a day. Spoke with Dr. Graylon Gunning, he gave orders for 14mg  nicotine patch daily. Will give and continue to monitor. Conley Simmonds, RN, BSN

## 2018-08-15 NOTE — Progress Notes (Signed)
Pre HD Tx   08/15/18 1100  Vital Signs  Temp 98.1 F (36.7 C)  Temp Source Oral  Pulse Rate 81  Pulse Rate Source Monitor  Resp (!) 8  BP (!) 146/90  BP Location Right Arm  BP Method Automatic  Patient Position (if appropriate) Lying  Oxygen Therapy  SpO2 94 %  O2 Device Room Air  Pulse Oximetry Type Continuous  Pain Assessment  Pain Scale 0-10  Pain Score 0  Dialysis Weight  Weight 132.7 kg  Type of Weight Pre-Dialysis  Time-Out for Hemodialysis  What Procedure? HD  Pt Identifiers(min of two) First/Last Name;MRN/Account#  Correct Site? Yes  Correct Side? Yes  Correct Procedure? Yes  Consents Verified? Yes  Rad Studies Available? N/A  Safety Precautions Reviewed? Yes  Engineer, civil (consulting) Number 5  Station Number 3  UF/Alarm Test Passed  Conductivity: Meter 14  Conductivity: Machine  13.9  pH 7.4  Reverse Osmosis Main  Normal Saline Lot Number [id number]  Dialyzer Lot Number [id number]  Disposable Set Lot Number [id number]  Machine Temperature 98.6 F (37 C)  Musician and Audible Yes  Blood Lines Intact and Secured Yes  Pre Treatment Patient Checks  Vascular access used during treatment Fistula  Hepatitis B Surface Antigen Results  (Unknown )  Isolation Initiated Yes  Hepatitis B Surface Antibody  (Unknown )  Date Hepatitis B Surface Antibody Drawn 08/15/18  Hemodialysis Consent Verified Yes  Hemodialysis Standing Orders Initiated Yes  ECG (Telemetry) Monitor On Yes  Prime Ordered Normal Saline  Length of  DialysisTreatment -hour(s) 3 Hour(s)  Dialysis Treatment Comments Na 140  Dialyzer Elisio 17H NR  Dialysate 2K, 2.5 Ca  Dialysis Anticoagulant None  Dialysate Flow Ordered 600  Blood Flow Rate Ordered 300 mL/min  Ultrafiltration Goal 1.5 Liters  Pre Treatment Labs Renal panel;Hepatitis B Surface Antigen;Phosphorus  Dialysis Blood Pressure Support Ordered Normal Saline  Education / Care Plan  Dialysis Education Provided Yes (Pt educated on [place] )  Documented Education in Care Plan Yes  Fistula / Graft Left Upper arm Arteriovenous fistula  No Placement Date or Time found.   Placed prior to admission: Yes  Orientation: Left  Access Location: Upper arm  Access Type: Arteriovenous fistula  Site Condition No complications  Fistula / Graft Assessment Present;Thrill;Bruit  Status Patent;Flushed  Needle Size 15g  Drainage Description None

## 2018-08-15 NOTE — Progress Notes (Signed)
[facility] [place] NAME: [name]    MR#:  [id number]  DATE OF BIRTH:  Jun 25, 1956  SUBJECTIVE:   Patient endorsing some mild "right rib pain".  He received dialysis today.  No issues.  He states he is "from [place]", although records state he is likely from [place].  He is visiting his parents, lives in [place].  He would like to be set up for outpatient dialysis in [facility] for "when he comes to visit his family".  REVIEW OF SYSTEMS:  Review of Systems  Constitutional: Negative for chills and fever.  HENT: Negative for congestion and sore throat.   Eyes: Negative for blurred vision and double vision.  Respiratory: Negative for cough and shortness of breath.   Cardiovascular: Negative for chest pain and palpitations.  Gastrointestinal: Negative for nausea and vomiting.  Genitourinary: Negative for dysuria and urgency.  Musculoskeletal: Negative for back pain and neck pain.  Neurological: Negative for dizziness and headaches.  Psychiatric/Behavioral: Negative for depression. The patient is not nervous/anxious.     DRUG ALLERGIES:   Allergies  Allergen Reactions  . Cyclobenzaprine Nausea Only  . Clonidine Other (See Comments)    Asymptomatic bradycardia to 38  . Furosemide Other (See Comments)    Caused renal failure  . Tylenol [Acetaminophen] Other (See Comments)    Reaction:  Pt states it bothers his liver   VITALS:  Blood pressure (!) 155/90, pulse 88, temperature 98.1 F (36.7 C), temperature source Oral, resp. rate 18, height 6' (1.829 m), weight 130.6 kg, SpO2 94 %. PHYSICAL EXAMINATION:  Physical Exam  Constitutional: Well-appearing, lying in bed in no acute distress. HEENT: Normocephalic and atraumatic. Pupils are equal, round, and reactive to light. Conjunctivae and EOM are normal. No scleral icterus.  Oropharynx is clear and moist.  Neck: Normal range of motion. Neck supple. No JVD present. No thyromegaly  present.  Cardiovascular:  RRR, no murmurs, rubs, gallops Respiratory: Effort normal and breath sounds normal. No respiratory distress. He has no wheezes. He has no rales.  GI: Soft. Bowel sounds are normal. He exhibits no distension. There is no abdominal tenderness.  Musculoskeletal:  No pedal edema, cyanosis, clubbing Neurological: He is alert and oriented to person, place, and time. No cranial nerve deficit.  Skin: Skin is warm and dry. No rash noted. No erythema.  Psychiatric: He has a normal mood and affect. His behavior is normal. Judgment and thought content normal.  LABORATORY PANEL:  Male CBC Recent Labs  Lab 08/15/18 0249  WBC 5.8  HGB 9.2*  HCT 27.9*  PLT 107*   ------------------------------------------------------------------------------------------------------------------ Chemistries  Recent Labs  Lab 08/15/18 0249  NA 138  K 4.0  CL 101  CO2 25  GLUCOSE 117*  BUN 77*  CREATININE 11.30*  CALCIUM 7.3*   RADIOLOGY:  Dg Chest 1 View  Result Date: 08/14/2018 CLINICAL DATA:  Shortness of breath. Patient has not had dialysis in 2 weeks and is not compliant with medication. EXAM: CHEST  1 VIEW COMPARISON:  10/05/2016 FINDINGS: Shallow inspiration. Cardiac enlargement. No significant vascular congestion or edema. Suggestion of a small cavitary lesion in the right mid lung measuring about 1.6 cm diameter. This was not present on previous study. Consider CT for further evaluation. Lungs are otherwise clear. No focal consolidation. No blunting of costophrenic angles. No pneumothorax. Mediastinal contours appear intact. IMPRESSION: Cardiac enlargement. Suggestion of cavitary lesion in the right mid lung. Consider CT for further evaluation. Electronically Signed  By: Lucienne Capers M.D.   On: 08/14/2018 21:55   Ct Chest Wo Contrast  Result Date: 08/14/2018 CLINICAL DATA:  63 year old male with chest pain. Possible mid right lung nodule or cavitary lesion on portable chest  today. Has not had dialysis in 2 weeks. EXAM: CT CHEST WITHOUT CONTRAST TECHNIQUE: Multidetector CT imaging of the chest was performed following the standard protocol without IV contrast. COMPARISON:  Portable chest 2135 hours. CT Abdomen and Pelvis 06/23/2016. FINDINGS: Cardiovascular: Cardiomegaly has increased since 2017. No pericardial effusion. Calcified coronary artery atherosclerosis (series 2, image 72). Vascular patency is not evaluated in the absence of IV contrast. Mediastinum/Nodes: Negative.  No mediastinal lymphadenopathy. Lungs/Pleura: Mild elevation of the right hemidiaphragm is chronic. There is no right lung nodule or mass. No right pleural effusion. Mild upper lobe centrilobular emphysema. Chronic mild paraseptal emphysema in the left costophrenic angle is stable since 2017. Mild left lung atelectasis. No left pleural effusion. Major airways are patent. Upper Abdomen: Mild respiratory motion in the upper abdomen. Chronic hypertrophy of the left hepatic lobe. The gallbladder is more contracted today. Stable and negative visible noncontrast spleen, left adrenal gland, bowel in the upper abdomen. Musculoskeletal: No right rib lesion to explain the earlier portable chest density. No acute or suspicious osseous abnormality. IMPRESSION: 1. No right lung lesion, cavitary or otherwise. Suspect artifact on the portable chest x-ray earlier today. 2. There is mild Emphysema, but no acute pulmonary process. 3. Cardiomegaly which has progressed since 2017. No pericardial effusion. 4. Chronic hypertrophy of the left hepatic lobe. Consider the possibility of Cirrhosis. Electronically Signed   By: Genevie Ann M.D.   On: 08/14/2018 22:48   ASSESSMENT AND PLAN:   Chest pain- resolved this morning.  Troponin is elevated, likely in the setting of recent cocaine use and ESRD. -Will try GI cocktail to see if this helps  ESRD on dialysis MWF -Received dialysis today -Waiting for outpatient HD to be set up prior to  discharge -Renvela 3 times daily started by nephro  Hypertension- BPs elevated -Continue home Norvasc, labetalol, hydralazine  HLD  -Continue home statin  Tobacco abuse -Nicotine patch as needed  Patient is medically stable for discharge.  Waiting for outpatient dialysis to be set up prior to discharge.  All the records are reviewed and case discussed with Care Management/Social Worker. Management plans discussed with the patient, family and they are in agreement.  CODE STATUS: Full Code  TOTAL TIME TAKING CARE OF THIS PATIENT: 40 minutes.   More than 50% of the time was spent in counseling/coordination of care: YES  POSSIBLE D/C IN 1-2 DAYS, DEPENDING ON CLINICAL CONDITION.   Berna Spare Ines Warf M.D on 08/15/2018 at 6:49 PM  Between 7am to 6pm - Pager [phone]  After 6pm go to [email or web address] - Proofreader  [facility]  Office  [phone]  CC: Primary care physician; Patient, No Pcp Per  Note: This dictation was prepared with Dragon dictation along with smaller phrase technology. Any transcriptional errors that result from this process are unintentional.

## 2018-08-15 NOTE — Progress Notes (Signed)
HD Tx completed, tolerated well, UF goal met.    08/15/18 1400  Vital Signs  Pulse Rate 84  BP (!) 163/103  Oxygen Therapy  SpO2 98 %  During Hemodialysis Assessment  HD Safety Checks Performed Yes  KECN 43.9 KECN  Dialysis Fluid Bolus Normal Saline  Bolus Amount (mL) 250 mL  Intra-Hemodialysis Comments Tx completed;Tolerated well

## 2018-08-15 NOTE — Care Management (Signed)
Patient is currently off unit.  Will obtain MOON letter at a later time.

## 2018-08-15 NOTE — Plan of Care (Signed)
  Problem: Activity: Goal: Risk for activity intolerance will decrease Outcome: Progressing Note:  Up in room independently    Problem: Elimination: Goal: Will not experience complications related to urinary retention Outcome: Progressing   Problem: Safety: Goal: Ability to remain free from injury will improve Outcome: Progressing   Problem: Pain Managment: Goal: General experience of comfort will improve Outcome: Not Progressing Note:  Complaints of chronic back pain and some chest pain as well treated with oxycodone & oxycotin   Problem: Education: Goal: Knowledge of General Education information will improve Description Including pain rating scale, medication(s)/side effects and non-pharmacologic comfort measures Outcome: Completed/Met

## 2018-08-15 NOTE — Progress Notes (Signed)
HD Tx started w/o issue   08/15/18 1101  Vital Signs  Pulse Rate Source Monitor  Resp 14  BP (!) 148/91  BP Location Right Arm  BP Method Automatic  Patient Position (if appropriate) Lying  Oxygen Therapy  O2 Device Room Air  Pulse Oximetry Type Continuous  During Hemodialysis Assessment  Blood Flow Rate (mL/min) 300 mL/min  Arterial Pressure (mmHg) -100 mmHg  Venous Pressure (mmHg) 130 mmHg  Transmembrane Pressure (mmHg) 50 mmHg  Ultrafiltration Rate (mL/min) 1000 mL/min  Dialysate Flow Rate (mL/min) 600 ml/min  Conductivity: Machine  13.7  HD Safety Checks Performed Yes  Dialysis Fluid Bolus Normal Saline  Bolus Amount (mL) 250 mL  Intra-Hemodialysis Comments Tx initiated  Fistula / Graft Left Upper arm Arteriovenous fistula  No Placement Date or Time found.   Placed prior to admission: Yes  Orientation: Left  Access Location: Upper arm  Access Type: Arteriovenous fistula  Status Accessed

## 2018-08-15 NOTE — Progress Notes (Signed)
Post HD Tx    08/15/18 1403  Vital Signs  Pulse Rate 83  BP (!) 160/89  Post-Hemodialysis Assessment  Rinseback Volume (mL) 250 mL  KECN 43.9 V  Dialyzer Clearance Lightly streaked  Duration of HD Treatment -hour(s) 3 hour(s)  Hemodialysis Intake (mL) 500 mL  UF Total -Machine (mL) 2036 mL  Net UF (mL) 1536 mL  Tolerated HD Treatment Yes  AVG/AVF Arterial Site Held (minutes) 10 minutes  AVG/AVF Venous Site Held (minutes) 10 minutes  Fistula / Graft Left Upper arm Arteriovenous fistula  No Placement Date or Time found.   Placed prior to admission: Yes  Orientation: Left  Access Location: Upper arm  Access Type: Arteriovenous fistula  Site Condition No complications  Fistula / Graft Assessment Present;Thrill;Bruit  Drainage Description None

## 2018-08-15 NOTE — Progress Notes (Signed)
Pre HD Assessment    08/15/18 1055  Neurological  Level of Consciousness Alert  Orientation Level Oriented to person;Oriented to place;Oriented to situation  Respiratory  Respiratory Pattern Regular;Labored  Chest Assessment Chest expansion symmetrical  Bilateral Breath Sounds Diminished;Fine crackles  Cough None  Cardiac  Pulse Regular  Heart Sounds S1, S2  ECG Monitor Yes  Vascular  R Radial Pulse +3  L Radial Pulse +3  Psychosocial  Psychosocial (WDL) WDL

## 2018-08-15 NOTE — Progress Notes (Signed)
[facility]  ROUNDING NOTE   Subjective:  Patient known to [place] from admission and 2018. In the interim he moved to [place] and was started on hemodialysis. Patient apparently has not had dialysis for the past 2 weeks. He reports history of nonadherence with dialysis treatments. He has now moved back to [place] to reestablish care locally.   Objective:  Vital signs in last 24 hours:  Temp:  [98.1 F (36.7 C)-98.4 F (36.9 C)] 98.1 F (36.7 C) (02/05 1100) Pulse Rate:  [76-96] 83 (02/05 1403) Resp:  [8-21] 21 (02/05 1300) BP: (129-169)/(75-109) 160/89 (02/05 1403) SpO2:  [92 %-100 %] 99 % (02/05 1145) Weight:  [127 kg-132.7 kg] 132.7 kg (02/05 1100)  Weight change:  Filed Weights   08/14/18 2118 08/15/18 0103 08/15/18 1100  Weight: 127 kg 128.6 kg 132.7 kg    Intake/Output: I/O last 3 completed shifts: In: 240 [P.O.:240] Out: 475 [Urine:475]   Intake/Output this shift:  Total I/O In: 360 [P.O.:360] Out: -   Physical Exam: General: No acute distress  Head: Normocephalic, atraumatic. Moist oral mucosal membranes  Eyes: Anicteric  Neck: Supple, trachea midline  Lungs:  Clear to auscultation, normal effort  Heart: S1S2 no rubs  Abdomen:  Soft, nontender, bowel sounds present  Extremities: 1+ peripheral edema.  Neurologic: Awake, alert, following commands  Skin: No lesions  Access: LUE AV access    Basic Metabolic Panel: Recent Labs  Lab 08/14/18 2115 08/15/18 0249 08/15/18 1117  NA 138 138  --   K 4.3 4.0  --   CL 100 101  --   CO2 25 25  --   GLUCOSE 99 117*  --   BUN 76* 77*  --   CREATININE 11.30* 11.30*  --   CALCIUM 7.6* 7.3*  --   PHOS  --   --  8.0*    Liver Function Tests: No results for input(s): AST, ALT, ALKPHOS, BILITOT, PROT, ALBUMIN in the last 168 hours. No results for input(s): LIPASE, AMYLASE in the last 168 hours. No results for input(s): AMMONIA in the last 168 hours.  CBC: Recent Labs  Lab 08/14/18 2115  08/15/18 0249  WBC 6.2 5.8  NEUTROABS 4.0  --   HGB 9.8* 9.2*  HCT 29.3* 27.9*  MCV 85.9 85.8  PLT 120* 107*    Cardiac Enzymes: Recent Labs  Lab 08/14/18 2115 08/15/18 0249 08/15/18 0858  TROPONINI 0.12* 0.12* 0.11*    BNP: Invalid input(s): POCBNP  CBG: Recent Labs  Lab 08/14/18 2105  GLUCAP 104*    Microbiology: Results for orders placed or performed during the hospital encounter of 08/14/18  MRSA PCR Screening     Status: None   Collection Time: 08/15/18  1:12 AM  Result Value Ref Range Status   MRSA by PCR NEGATIVE NEGATIVE Final    Comment:        The GeneXpert MRSA Assay (FDA approved for NASAL specimens only), is one component of a comprehensive MRSA colonization surveillance program. It is not intended to diagnose MRSA infection nor to guide or monitor treatment for MRSA infections. Performed at [facility], [address] [place],  [address]     Coagulation Studies: No results for input(s): LABPROT, INR in the last 72 hours.  Urinalysis: No results for input(s): COLORURINE, LABSPEC, PHURINE, GLUCOSEU, HGBUR, BILIRUBINUR, KETONESUR, PROTEINUR, UROBILINOGEN, NITRITE, LEUKOCYTESUR in the last 72 hours.  Invalid input(s): APPERANCEUR    Imaging: Dg Chest 1 View  Result Date: 08/14/2018 CLINICAL DATA:  Shortness of breath. Patient has not had dialysis in 2 weeks and is not compliant with medication. EXAM: CHEST  1 VIEW COMPARISON:  10/05/2016 FINDINGS: Shallow inspiration. Cardiac enlargement. No significant vascular congestion or edema. Suggestion of a small cavitary lesion in the right mid lung measuring about 1.6 cm diameter. This was not present on previous study. Consider CT for further evaluation. Lungs are otherwise clear. No focal consolidation. No blunting of costophrenic angles. No pneumothorax. Mediastinal contours appear intact. IMPRESSION: Cardiac enlargement. Suggestion of cavitary lesion in the right mid lung. Consider  CT for further evaluation. Electronically Signed   By: Lucienne Capers M.D.   On: 08/14/2018 21:55   Ct Chest Wo Contrast  Result Date: 08/14/2018 CLINICAL DATA:  63 year old male with chest pain. Possible mid right lung nodule or cavitary lesion on portable chest today. Has not had dialysis in 2 weeks. EXAM: CT CHEST WITHOUT CONTRAST TECHNIQUE: Multidetector CT imaging of the chest was performed following the standard protocol without IV contrast. COMPARISON:  Portable chest 2135 hours. CT Abdomen and Pelvis 06/23/2016. FINDINGS: Cardiovascular: Cardiomegaly has increased since 2017. No pericardial effusion. Calcified coronary artery atherosclerosis (series 2, image 72). Vascular patency is not evaluated in the absence of IV contrast. Mediastinum/Nodes: Negative.  No mediastinal lymphadenopathy. Lungs/Pleura: Mild elevation of the right hemidiaphragm is chronic. There is no right lung nodule or mass. No right pleural effusion. Mild upper lobe centrilobular emphysema. Chronic mild paraseptal emphysema in the left costophrenic angle is stable since 2017. Mild left lung atelectasis. No left pleural effusion. Major airways are patent. Upper Abdomen: Mild respiratory motion in the upper abdomen. Chronic hypertrophy of the left hepatic lobe. The gallbladder is more contracted today. Stable and negative visible noncontrast spleen, left adrenal gland, bowel in the upper abdomen. Musculoskeletal: No right rib lesion to explain the earlier portable chest density. No acute or suspicious osseous abnormality. IMPRESSION: 1. No right lung lesion, cavitary or otherwise. Suspect artifact on the portable chest x-ray earlier today. 2. There is mild Emphysema, but no acute pulmonary process. 3. Cardiomegaly which has progressed since 2017. No pericardial effusion. 4. Chronic hypertrophy of the left hepatic lobe. Consider the possibility of Cirrhosis. Electronically Signed   By: Genevie Ann M.D.   On: 08/14/2018 22:48      Medications:   . sodium chloride    . sodium chloride     . amLODipine  10 mg Oral Daily  . aspirin EC  81 mg Oral Daily  . atorvastatin  40 mg Oral Daily  . Chlorhexidine Gluconate Cloth  6 each Topical Q0600  . heparin  5,000 Units Subcutaneous Q8H  . hydrALAZINE  100 mg Oral TID  . isosorbide mononitrate  60 mg Oral Daily  . labetalol  300 mg Oral TID  . nicotine  14 mg Transdermal Daily  . oxyCODONE  20 mg Oral Q12H  . tamsulosin  0.4 mg Oral Daily  . terazosin  5 mg Oral QHS   sodium chloride, sodium chloride, acetaminophen **OR** acetaminophen, alteplase, heparin, lidocaine (PF), lidocaine-prilocaine, ondansetron **OR** ondansetron (ZOFRAN) IV, oxyCODONE, pentafluoroprop-tetrafluoroeth, traZODone  Assessment/ Plan:  63 y.o. male with a PMHx of depression, hypertension, pulmonary embolism, chronic kidney disease stage IV, who was admitted to [facility] on 3/14/2018for evaluation of chest pain. Upon initial presentation he was found to have severe hypertension with a systolic blood pressure greater than 200.   1. End-stage renal disease:  Patient was previously dialyzing in [place].  However he is now moved back to  the [place] area.  We will need to begin searchfor outpatient hemodialysis unit that the patient can be placed.   2. Hypertension.  Continue amlodipine, hydralazine, labetalol, and terazosin.  3.  Anemia chronic kidney disease.  Consider starting the patient on Epogen with Also treatment.  4.  Secondary hyperparathyroidism.  Start the patient on Renvela 2 tablets by mouth 3 times a day with meals.   LOS: 0 Fabyan Loughmiller 2/5/20202:13 PM

## 2018-08-15 NOTE — Progress Notes (Signed)
Post HD assessment    08/15/18 1410  Neurological  Level of Consciousness Alert  Orientation Level Oriented to person;Oriented to place;Oriented to situation  Respiratory  Respiratory Pattern Regular  Chest Assessment Chest expansion symmetrical  Bilateral Breath Sounds Clear;Diminished  Cough None  Cardiac  Pulse Regular  Heart Sounds S1, S2  ECG Monitor Yes  Vascular  R Radial Pulse +3  L Radial Pulse +3  Psychosocial  Psychosocial (WDL) WDL

## 2018-08-15 NOTE — Care Management (Signed)
Patient was to discharge today.  RNCM spoke with patient via phone.  Patient states "no im going back to [place], but they were supposed to set me up in [place] when I come to visit my family".  Patient states that his normal HD days are "MWF".  RNCM inquired that if he leaves today would he be returning to [place] to get HD on Friday.  Patient states "no I dont know when Im going back". Patient confirms that he needs HD set up in [place].   RNCM confirmed with nephrology that patient will need outpatient HD set up.    [facility] HD liaison notified that local placement would need to be arranged.  Per Estill Bamberg she confirmed that patient actually lives in [place], and goes to outpatient HD in [place].  Patient expressed to Estill Bamberg that he did not want his clinic in [place] to find out about his drug use, therefore he told people he went to a clinic in [place]

## 2018-08-15 NOTE — ED Notes (Signed)
ED TO INPATIENT HANDOFF REPORT  ED Nurse Name and Phone #: Stanford Scotland [phone] Name/Age/Gender [name] 63 y.o. male Room/Bed: ED07A/ED07A  Code Status   Code Status: Prior  Home/SNF/Other Home Patient oriented to: self, place, time and situation Is this baseline? Yes   Triage Complete: Triage complete   Chief Complaint chest pain  Triage Note Pt reports he has not had dialysis in 2 weeks and has not been compliant with medication. Pt is poor historian. Pt has hx/o hypertension. Pt taken to [facility]. Pt reports he has been in [place] and is in transition of moving back here.    Allergies Allergies  Allergen Reactions  . Cyclobenzaprine Nausea Only  . Clonidine Other (See Comments)    Asymptomatic bradycardia to 38  . Furosemide Other (See Comments)    Caused renal failure  . Tylenol [Acetaminophen] Other (See Comments)    Reaction:  Pt states it bothers his liver    Level of Care/Admitting Diagnosis ED Disposition    ED Disposition Condition Comment   Admit  Hospital Area: [facility] [[id number]]  Level of Care: Med-Surg [16]  Diagnosis: ESRD on dialysis [facility]) [[id number]]  Admitting Physician: Lance Coon [[id number]]  Attending Physician: Lance Coon [phone]  PT Class (Do Not Modify): Observation [104]  PT Acc Code (Do Not Modify): Observation [10022]       Medical/Surgery History Past Medical History:  Diagnosis Date  . Depression   . Heart attack ([place])   . Hypertension   . PE (pulmonary embolism)   . Renal disorder    Past Surgical History:  Procedure Laterality Date  . TOTAL HIP ARTHROPLASTY       IV Location/Drains/Wounds Patient Lines/Drains/Airways Status   Active Line/Drains/Airways    Name:   Placement date:   Placement time:   Site:   Days:   Peripheral IV 09/22/16 Right Wrist   09/22/16    1218    Wrist   692   Peripheral IV 08/14/18 Right Wrist   08/14/18    2114    Wrist   1          Intake/Output Last 24  hours No intake or output data in the 24 hours ending 08/15/18 0035  Labs/Imaging Results for orders placed or performed during the hospital encounter of 08/14/18 (from the past 48 hour(s))  Glucose, capillary     Status: Abnormal   Collection Time: 08/14/18  9:05 PM  Result Value Ref Range   Glucose-Capillary 104 (H) 70 - 99 mg/dL  CBC with Differential     Status: Abnormal   Collection Time: 08/14/18  9:15 PM  Result Value Ref Range   WBC 6.2 4.0 - 10.5 K/uL   RBC 3.41 (L) 4.22 - 5.81 MIL/uL   Hemoglobin 9.8 (L) 13.0 - 17.0 g/dL   HCT 29.3 (L) 39.0 - 52.0 %   MCV 85.9 80.0 - 100.0 fL   MCH 28.7 26.0 - 34.0 pg   MCHC 33.4 30.0 - 36.0 g/dL   RDW 15.3 11.5 - 15.5 %   Platelets 120 (L) 150 - 400 K/uL   nRBC 0.0 0.0 - 0.2 %   Neutrophils Relative % 63 %   Neutro Abs 4.0 1.7 - 7.7 K/uL   Lymphocytes Relative 23 %   Lymphs Abs 1.4 0.7 - 4.0 K/uL   Monocytes Relative 11 %   Monocytes Absolute 0.7 0.1 - 1.0 K/uL   Eosinophils Relative 1 %   Eosinophils Absolute 0.1  0.0 - 0.5 K/uL   Basophils Relative 1 %   Basophils Absolute 0.0 0.0 - 0.1 K/uL   Immature Granulocytes 1 %   Abs Immature Granulocytes 0.06 0.00 - 0.07 K/uL    Comment: Performed at [facility], [address] [place], [place] [address]  Basic metabolic panel     Status: Abnormal   Collection Time: 08/14/18  9:15 PM  Result Value Ref Range   Sodium 138 135 - 145 mmol/L   Potassium 4.3 3.5 - 5.1 mmol/L   Chloride 100 98 - 111 mmol/L   CO2 25 22 - 32 mmol/L   Glucose, Bld 99 70 - 99 mg/dL   BUN 76 (H) 6 - 20 mg/dL   Creatinine, Ser 11.30 (H) 0.61 - 1.24 mg/dL   Calcium 7.6 (L) 8.9 - 10.3 mg/dL   GFR calc non Af Amer 4 (L) >60 mL/min   GFR calc Af Amer 5 (L) >60 mL/min   Anion gap 13 5 - 15    Comment: Performed at [facility], [address] [place], [place] [address]  Troponin I - ONCE - STAT     Status: Abnormal   Collection Time: 08/14/18  9:15 PM  Result Value Ref Range   Troponin I  0.12 (HH) <0.03 ng/mL    Comment: CRITICAL RESULT CALLED TO, READ BACK BY AND VERIFIED WITH Amayah Staheli 08/14/18 @ 2228  [facility] Performed at [facility], [address], [place],  [address]    Dg Chest 1 View  Result Date: 08/14/2018 CLINICAL DATA:  Shortness of breath. Patient has not had dialysis in 2 weeks and is not compliant with medication. EXAM: CHEST  1 VIEW COMPARISON:  10/05/2016 FINDINGS: Shallow inspiration. Cardiac enlargement. No significant vascular congestion or edema. Suggestion of a small cavitary lesion in the right mid lung measuring about 1.6 cm diameter. This was not present on previous study. Consider CT for further evaluation. Lungs are otherwise clear. No focal consolidation. No blunting of costophrenic angles. No pneumothorax. Mediastinal contours appear intact. IMPRESSION: Cardiac enlargement. Suggestion of cavitary lesion in the right mid lung. Consider CT for further evaluation. Electronically Signed   By: Lucienne Capers M.D.   On: 08/14/2018 21:55   Ct Chest Wo Contrast  Result Date: 08/14/2018 CLINICAL DATA:  63 year old male with chest pain. Possible mid right lung nodule or cavitary lesion on portable chest today. Has not had dialysis in 2 weeks. EXAM: CT CHEST WITHOUT CONTRAST TECHNIQUE: Multidetector CT imaging of the chest was performed following the standard protocol without IV contrast. COMPARISON:  Portable chest 2135 hours. CT Abdomen and Pelvis 06/23/2016. FINDINGS: Cardiovascular: Cardiomegaly has increased since 2017. No pericardial effusion. Calcified coronary artery atherosclerosis (series 2, image 72). Vascular patency is not evaluated in the absence of IV contrast. Mediastinum/Nodes: Negative.  No mediastinal lymphadenopathy. Lungs/Pleura: Mild elevation of the right hemidiaphragm is chronic. There is no right lung nodule or mass. No right pleural effusion. Mild upper lobe centrilobular emphysema. Chronic mild paraseptal emphysema in the  left costophrenic angle is stable since 2017. Mild left lung atelectasis. No left pleural effusion. Major airways are patent. Upper Abdomen: Mild respiratory motion in the upper abdomen. Chronic hypertrophy of the left hepatic lobe. The gallbladder is more contracted today. Stable and negative visible noncontrast spleen, left adrenal gland, bowel in the upper abdomen. Musculoskeletal: No right rib lesion to explain the earlier portable chest density. No acute or suspicious osseous abnormality. IMPRESSION: 1. No right lung lesion, cavitary  or otherwise. Suspect artifact on the portable chest x-ray earlier today. 2. There is mild Emphysema, but no acute pulmonary process. 3. Cardiomegaly which has progressed since 2017. No pericardial effusion. 4. Chronic hypertrophy of the left hepatic lobe. Consider the possibility of Cirrhosis. Electronically Signed   By: Genevie Ann M.D.   On: 08/14/2018 22:48    Pending Labs [employer] (From admission, onward)    Start     Ordered   Signed and Held  HIV antibody (Routine Testing)  Once,   R     Signed and Held   Signed and Held  CBC  (heparin)  Once,   R    Comments:  Baseline for heparin therapy IF NOT ALREADY DRAWN.  Notify MD if PLT < 100 K.    Signed and Held   Signed and Held  Creatinine, serum  (heparin)  Once,   R    Comments:  Baseline for heparin therapy IF NOT ALREADY DRAWN.    Signed and Held   Signed and Held  Basic metabolic panel  Tomorrow morning,   R     Signed and Held   Signed and Held  CBC  Tomorrow morning,   R     Signed and Held          Vitals/Pain Today's Vitals   08/14/18 2200 08/14/18 2215 08/14/18 2300 08/14/18 2330  BP: (!) 151/94  (!) 160/107 (!) 156/106  Pulse: 84 83 83 80  Resp: 18 13 20    Temp:      TempSrc:      SpO2: 98% 100% 99% 93%  Weight:      Height:        Isolation Precautions No active isolations  Medications Medications - No data to display  Mobility walks Low fall risk   Focused  Assessments Cardiac Assessment Handoff:  Cardiac Rhythm: Normal sinus rhythm(HR 90) Lab Results  Component Value Date   CKTOTAL 167 01/01/2014   CKMB 5.9 (H) 10/20/2014   TROPONINI 0.12 (HH) 08/14/2018   Lab Results  Component Value Date   DDIMER (H) 06/18/2008    0.61        AT THE INHOUSE ESTABLISHED CUTOFF VALUE OF 0.48 ug/mL FEU, THIS ASSAY HAS BEEN DOCUMENTED IN THE LITERATURE TO HAVE   Does the Patient currently have chest pain? No  , Renal Assessment Handoff:  Hemodialysis Schedule: Hemodialysis Schedule: Monday/Wednesday/Friday Last Hemodialysis date and time: two weeks ago   Restricted appendage: left arm     Recommendations: See Admitting Provider Note  Report given to:   Additional Notes:

## 2018-08-16 LAB — RENAL FUNCTION PANEL
Albumin: 3.2 g/dL — ABNORMAL LOW (ref 3.5–5.0)
Anion gap: 10 (ref 5–15)
BUN: 60 mg/dL — AB (ref 6–20)
CO2: 28 mmol/L (ref 22–32)
Calcium: 7.3 mg/dL — ABNORMAL LOW (ref 8.9–10.3)
Chloride: 100 mmol/L (ref 98–111)
Creatinine, Ser: 8.41 mg/dL — ABNORMAL HIGH (ref 0.61–1.24)
GFR calc Af Amer: 7 mL/min — ABNORMAL LOW (ref >60)
GFR calc non Af Amer: 6 mL/min — ABNORMAL LOW (ref >60)
Glucose, Bld: 106 mg/dL — ABNORMAL HIGH (ref 70–99)
Phosphorus: 5.6 mg/dL — ABNORMAL HIGH (ref 2.5–4.6)
Potassium: 4 mmol/L (ref 3.5–5.1)
Sodium: 138 mmol/L (ref 135–145)

## 2018-08-16 LAB — HEPATITIS B SURFACE ANTIBODY,QUALITATIVE: HEP B S AB: REACTIVE

## 2018-08-16 LAB — PARATHYROID HORMONE, INTACT (NO CA): PTH: 758 pg/mL — ABNORMAL HIGH (ref 15–65)

## 2018-08-16 LAB — HEPATITIS B CORE ANTIBODY, TOTAL: Hep B Core Total Ab: POSITIVE — AB

## 2018-08-16 LAB — HEPATITIS B SURFACE ANTIGEN: Hepatitis B Surface Ag: NEGATIVE

## 2018-08-16 LAB — HIV ANTIBODY (ROUTINE TESTING W REFLEX): HIV SCREEN 4TH GENERATION: NONREACTIVE

## 2018-08-16 MED ORDER — OXYCODONE HCL 5 MG PO TABS
10.0000 mg | ORAL_TABLET | ORAL | Status: DC | PRN
  Administered 2018-08-16 – 2018-08-18 (×8): 10 mg via ORAL
  Filled 2018-08-16 (×8): qty 2

## 2018-08-16 MED ORDER — IPRATROPIUM-ALBUTEROL 0.5-2.5 (3) MG/3ML IN SOLN
3.0000 mL | Freq: Four times a day (QID) | RESPIRATORY_TRACT | Status: DC | PRN
  Administered 2018-08-16 – 2018-08-17 (×2): 3 mL via RESPIRATORY_TRACT
  Filled 2018-08-16 (×2): qty 3

## 2018-08-16 MED ORDER — ALUM & MAG HYDROXIDE-SIMETH 200-200-20 MG/5ML PO SUSP
30.0000 mL | Freq: Four times a day (QID) | ORAL | Status: DC | PRN
  Filled 2018-08-16: qty 30

## 2018-08-16 MED ORDER — LIDOCAINE VISCOUS HCL 2 % MT SOLN
15.0000 mL | Freq: Four times a day (QID) | OROMUCOSAL | Status: DC | PRN
  Filled 2018-08-16: qty 15

## 2018-08-16 NOTE — Progress Notes (Signed)
[place] [facility] [place] NAME: [name]    MR#:  [id number]  DATE OF BIRTH:  12/20/1955  SUBJECTIVE:   States he is feeling much better this morning.  His chest pain has completely resolved with the GI cocktail.  He denies any shortness of breath or lower extremity edema.  REVIEW OF SYSTEMS:  Review of Systems  Constitutional: Negative for chills and fever.  HENT: Negative for congestion and sore throat.   Eyes: Negative for blurred vision and double vision.  Respiratory: Negative for cough and shortness of breath.   Cardiovascular: Negative for chest pain and palpitations.  Gastrointestinal: Negative for nausea and vomiting.  Genitourinary: Negative for dysuria and urgency.  Musculoskeletal: Negative for back pain and neck pain.  Neurological: Negative for dizziness and headaches.  Psychiatric/Behavioral: Negative for depression. The patient is not nervous/anxious.    DRUG ALLERGIES:   Allergies  Allergen Reactions  . Cyclobenzaprine Nausea Only  . Clonidine Other (See Comments)    Asymptomatic bradycardia to 38  . Furosemide Other (See Comments)    Caused renal failure  . Tylenol [Acetaminophen] Other (See Comments)    Reaction:  Pt states it bothers his liver   VITALS:  Blood pressure 126/76, pulse 72, temperature 98.1 F (36.7 C), temperature source Oral, resp. rate 19, height 6' (1.829 m), weight 128.6 kg, SpO2 97 %. PHYSICAL EXAMINATION:  Physical Exam  Constitutional: Well-appearing, sitting up on edge of bed in no acute distress. HEENT: Normocephalic and atraumatic. Pupils are equal, round, and reactive to light. Conjunctivae and EOM are normal. No scleral icterus.  Oropharynx is clear and moist.  Neck: Normal range of motion. Neck supple. No JVD present. No thyromegaly present.  Cardiovascular:  RRR, no murmurs, rubs, gallops Respiratory: Effort normal and breath sounds normal. No respiratory distress. He has no  wheezes. He has no rales.  GI: Soft. Bowel sounds are normal. He exhibits no distension. There is no abdominal tenderness.  Musculoskeletal:  No pedal edema, cyanosis, clubbing Neurological: He is alert and oriented to person, place, and time. No cranial nerve deficit.  Skin: Skin is warm and dry. No rash noted. No erythema.  Psychiatric: He has a normal mood and affect. His behavior is normal. Judgment and thought content normal.  LABORATORY PANEL:  Male CBC Recent Labs  Lab 08/15/18 0249  WBC 5.8  HGB 9.2*  HCT 27.9*  PLT 107*   ------------------------------------------------------------------------------------------------------------------ Chemistries  Recent Labs  Lab 08/16/18 0325  NA 138  K 4.0  CL 100  CO2 28  GLUCOSE 106*  BUN 60*  CREATININE 8.41*  CALCIUM 7.3*   RADIOLOGY:  No results found. ASSESSMENT AND PLAN:   Chest pain- resolved this morning.  Troponin is elevated, likely in the setting of recent cocaine use and ESRD. -GI cocktail PRN  ESRD on dialysis MWF -Received dialysis yesterday -Waiting for outpatient HD to be set up prior to discharge -Renvela 3 times daily started by nephro  Hypertension- BPs improved -Continue home Norvasc, labetalol, hydralazine  HLD- stable -Continue home statin  Tobacco abuse -Nicotine patch as needed  Patient is medically stable for discharge.  Waiting for outpatient dialysis to be set up prior to discharge.  All the records are reviewed and case discussed with Care Management/Social Worker. Management plans discussed with the patient, family and they are in agreement.  CODE STATUS: Full Code  TOTAL TIME TAKING CARE OF THIS PATIENT: 33 minutes.   More than 50%  of the time was spent in counseling/coordination of care: YES  POSSIBLE D/C IN 1-2 DAYS, DEPENDING ON CLINICAL CONDITION.   Berna Spare Izza Bickle M.D on 08/16/2018 at 3:39 PM  Between 7am to 6pm - Pager [phone]  After 6pm go to [email or web address] -  Proofreader  [facility]  Office  [phone]  CC: Primary care physician; Patient, No Pcp Per  Note: This dictation was prepared with Dragon dictation along with smaller phrase technology. Any transcriptional errors that result from this process are unintentional.

## 2018-08-16 NOTE — Progress Notes (Signed)
[facility]  ROUNDING NOTE   Subjective:  Patient underwent hemodialysis yesterday. We will plan for dialysis again tomorrow. Visiting for approximately 1 week. Therefore we will need to coordinate dialysis as a guest in 1 of the outpatient units.   Objective:  Vital signs in last 24 hours:  Temp:  [98.1 F (36.7 C)-99.1 F (37.3 C)] 98.4 F (36.9 C) (02/06 0748) Pulse Rate:  [70-88] 70 (02/06 0748) Resp:  [16-21] 20 (02/06 0748) BP: (134-163)/(76-103) 137/83 (02/06 0748) SpO2:  [92 %-98 %] 92 % (02/06 0748) Weight:  [128.6 kg-130.6 kg] 128.6 kg (02/06 0542)  Weight change: 5.693 kg Filed Weights   08/15/18 1100 08/15/18 1403 08/16/18 0542  Weight: 132.7 kg 130.6 kg 128.6 kg    Intake/Output: I/O last 3 completed shifts: In: 840 [P.O.:840] Out: 2611 [Urine:1075; Other:1536]   Intake/Output this shift:  Total I/O In: 240 [P.O.:240] Out: -   Physical Exam: General: No acute distress  Head: Normocephalic, atraumatic. Moist oral mucosal membranes  Eyes: Anicteric  Neck: Supple, trachea midline  Lungs:  Clear to auscultation, normal effort  Heart: S1S2 no rubs  Abdomen:  Soft, nontender, bowel sounds present  Extremities: 1+ peripheral edema.  Neurologic: Awake, alert, following commands  Skin: No lesions  Access: LUE AV access    Basic Metabolic Panel: Recent Labs  Lab 08/14/18 2115 08/15/18 0249 08/15/18 1117 08/16/18 0325  NA 138 138  --  138  K 4.3 4.0  --  4.0  CL 100 101  --  100  CO2 25 25  --  28  GLUCOSE 99 117*  --  106*  BUN 76* 77*  --  60*  CREATININE 11.30* 11.30*  --  8.41*  CALCIUM 7.6* 7.3*  --  7.3*  PHOS  --   --  8.0* 5.6*    Liver Function Tests: Recent Labs  Lab 08/16/18 0325  ALBUMIN 3.2*   No results for input(s): LIPASE, AMYLASE in the last 168 hours. No results for input(s): AMMONIA in the last 168 hours.  CBC: Recent Labs  Lab 08/14/18 2115 08/15/18 0249  WBC 6.2 5.8  NEUTROABS 4.0  --   HGB 9.8*  9.2*  HCT 29.3* 27.9*  MCV 85.9 85.8  PLT 120* 107*    Cardiac Enzymes: Recent Labs  Lab 08/14/18 2115 08/15/18 0249 08/15/18 0858  TROPONINI 0.12* 0.12* 0.11*    BNP: Invalid input(s): POCBNP  CBG: Recent Labs  Lab 08/14/18 2105  GLUCAP 104*    Microbiology: Results for orders placed or performed during the hospital encounter of 08/14/18  MRSA PCR Screening     Status: None   Collection Time: 08/15/18  1:12 AM  Result Value Ref Range Status   MRSA by PCR NEGATIVE NEGATIVE Final    Comment:        The GeneXpert MRSA Assay (FDA approved for NASAL specimens only), is one component of a comprehensive MRSA colonization surveillance program. It is not intended to diagnose MRSA infection nor to guide or monitor treatment for MRSA infections. Performed at [facility], [address] [place], [place] [address]     Coagulation Studies: No results for input(s): LABPROT, INR in the last 72 hours.  Urinalysis: No results for input(s): COLORURINE, LABSPEC, PHURINE, GLUCOSEU, HGBUR, BILIRUBINUR, KETONESUR, PROTEINUR, UROBILINOGEN, NITRITE, LEUKOCYTESUR in the last 72 hours.  Invalid input(s): APPERANCEUR    Imaging: Dg Chest 1 View  Result Date: 08/14/2018 CLINICAL DATA:  Shortness of breath. Patient has not had dialysis  in 2 weeks and is not compliant with medication. EXAM: CHEST  1 VIEW COMPARISON:  10/05/2016 FINDINGS: Shallow inspiration. Cardiac enlargement. No significant vascular congestion or edema. Suggestion of a small cavitary lesion in the right mid lung measuring about 1.6 cm diameter. This was not present on previous study. Consider CT for further evaluation. Lungs are otherwise clear. No focal consolidation. No blunting of costophrenic angles. No pneumothorax. Mediastinal contours appear intact. IMPRESSION: Cardiac enlargement. Suggestion of cavitary lesion in the right mid lung. Consider CT for further evaluation. Electronically Signed   By:  Lucienne Capers M.D.   On: 08/14/2018 21:55   Ct Chest Wo Contrast  Result Date: 08/14/2018 CLINICAL DATA:  63 year old male with chest pain. Possible mid right lung nodule or cavitary lesion on portable chest today. Has not had dialysis in 2 weeks. EXAM: CT CHEST WITHOUT CONTRAST TECHNIQUE: Multidetector CT imaging of the chest was performed following the standard protocol without IV contrast. COMPARISON:  Portable chest 2135 hours. CT Abdomen and Pelvis 06/23/2016. FINDINGS: Cardiovascular: Cardiomegaly has increased since 2017. No pericardial effusion. Calcified coronary artery atherosclerosis (series 2, image 72). Vascular patency is not evaluated in the absence of IV contrast. Mediastinum/Nodes: Negative.  No mediastinal lymphadenopathy. Lungs/Pleura: Mild elevation of the right hemidiaphragm is chronic. There is no right lung nodule or mass. No right pleural effusion. Mild upper lobe centrilobular emphysema. Chronic mild paraseptal emphysema in the left costophrenic angle is stable since 2017. Mild left lung atelectasis. No left pleural effusion. Major airways are patent. Upper Abdomen: Mild respiratory motion in the upper abdomen. Chronic hypertrophy of the left hepatic lobe. The gallbladder is more contracted today. Stable and negative visible noncontrast spleen, left adrenal gland, bowel in the upper abdomen. Musculoskeletal: No right rib lesion to explain the earlier portable chest density. No acute or suspicious osseous abnormality. IMPRESSION: 1. No right lung lesion, cavitary or otherwise. Suspect artifact on the portable chest x-ray earlier today. 2. There is mild Emphysema, but no acute pulmonary process. 3. Cardiomegaly which has progressed since 2017. No pericardial effusion. 4. Chronic hypertrophy of the left hepatic lobe. Consider the possibility of Cirrhosis. Electronically Signed   By: Genevie Ann M.D.   On: 08/14/2018 22:48     Medications:    . amLODipine  10 mg Oral Daily  . aspirin  EC  81 mg Oral Daily  . atorvastatin  40 mg Oral Daily  . heparin  5,000 Units Subcutaneous Q8H  . hydrALAZINE  100 mg Oral TID  . isosorbide mononitrate  60 mg Oral Daily  . labetalol  300 mg Oral TID  . nicotine  14 mg Transdermal Daily  . oxyCODONE  20 mg Oral Q12H  . sevelamer carbonate  1,600 mg Oral TID WC  . tamsulosin  0.4 mg Oral Daily  . terazosin  5 mg Oral QHS   acetaminophen **OR** acetaminophen, alum & mag hydroxide-simeth **AND** lidocaine, ipratropium-albuterol, ondansetron **OR** ondansetron (ZOFRAN) IV, oxyCODONE, traZODone  Assessment/ Plan:  63 y.o. male with a PMHx of depression, hypertension, pulmonary embolism, chronic kidney disease stage IV, who was admitted to [facility] on 3/14/2018for evaluation of chest pain. Upon initial presentation he was found to have severe hypertension with a systolic blood pressure greater than 200.   1. End-stage renal disease: After further discussion with the patient it appears that he is only visiting [place] at this time.  He states that he will be in the area for 1 week to 10 days.  Therefore we will need  to set him up as aghast patient in 1 of the outpatient dialysis clinics in the area.  Dialysis liaison to help with this placement.  2. Hypertension.  Continue amlodipine, hydralazine, labetalol, and terazosin.  3.  Anemia chronic kidney disease.  hemoglobin 9.2 at last check.  Continue to monitor.  4.  Secondary hyperparathyroidism.  Phosphorus down to 5.6 with dialysis.  Maintain the patient on Renvela 2 tablets p.o. 3 times daily with meals.   LOS: 0 [name] 2/6/202011:53 AM

## 2018-08-16 NOTE — Care Management Obs Status (Signed)
MEDICARE OBSERVATION STATUS NOTIFICATION   Patient Details  Name: [name] MRN: [id number] Date of Birth: [birth year]   Medicare Observation Status Notification Given:  Yes    Elza Rafter, RN 08/16/2018, 1:33 PM

## 2018-08-17 ENCOUNTER — Other Ambulatory Visit: Payer: Self-pay | Admitting: Internal Medicine

## 2018-08-17 LAB — QUANTIFERON-TB GOLD PLUS (RQFGPL)
QuantiFERON Nil Value: 0.02 IU/mL
QuantiFERON TB1 Ag Value: 0.02 IU/mL
QuantiFERON TB2 Ag Value: 0.02 IU/mL

## 2018-08-17 LAB — RENAL FUNCTION PANEL
Albumin: 3.1 g/dL — ABNORMAL LOW (ref 3.5–5.0)
Anion gap: 11 (ref 5–15)
BUN: 65 mg/dL — AB (ref 6–20)
CO2: 26 mmol/L (ref 22–32)
Calcium: 7.5 mg/dL — ABNORMAL LOW (ref 8.9–10.3)
Chloride: 100 mmol/L (ref 98–111)
Creatinine, Ser: 9.35 mg/dL — ABNORMAL HIGH (ref 0.61–1.24)
GFR calc Af Amer: 6 mL/min — ABNORMAL LOW (ref >60)
GFR calc non Af Amer: 5 mL/min — ABNORMAL LOW (ref >60)
Glucose, Bld: 97 mg/dL (ref 70–99)
Phosphorus: 5.8 mg/dL — ABNORMAL HIGH (ref 2.5–4.6)
Potassium: 4 mmol/L (ref 3.5–5.1)
Sodium: 137 mmol/L (ref 135–145)

## 2018-08-17 LAB — PHOSPHORUS: Phosphorus: 5.9 mg/dL — ABNORMAL HIGH (ref 2.5–4.6)

## 2018-08-17 LAB — QUANTIFERON-TB GOLD PLUS: QuantiFERON-TB Gold Plus: NEGATIVE

## 2018-08-17 MED ORDER — SEVELAMER CARBONATE 800 MG PO TABS
2400.0000 mg | ORAL_TABLET | Freq: Three times a day (TID) | ORAL | Status: DC
  Administered 2018-08-17 – 2018-08-18 (×2): 2400 mg via ORAL
  Filled 2018-08-17 (×3): qty 3

## 2018-08-17 MED ORDER — SEVELAMER CARBONATE 800 MG PO TABS: 2400.0000 mg | ORAL_TABLET | Freq: Three times a day (TID) | ORAL | 0 refills | Status: DC

## 2018-08-17 NOTE — Discharge Instructions (Signed)
Please make sure you follow-up with your primary care doctor and kidney doctor in [place].  The kidney doctor started you on a medication called Renvela. Please take 3 tablets three times a day.  Your dialysis has been set up for Tuesday at 10:45AM at [facility] in [place].  Take care, Dr. Brett Albino

## 2018-08-17 NOTE — Care Management Note (Signed)
Case Management Note  Patient Details  Name: [name] MRN: [id number] Date of Birth: 1956/05/22  Subjective/Objective:      Patient is set up for outpatient  HD on TTS.  This Tuesday he is to be at [facility] in [place] at 10:45; Thursday and Saturday he needs to be there at [place] with [facility]'s has made patient aware.  He will DC after HD this evening.              Action/Plan:   Expected Discharge Date:  08/17/18               Expected Discharge Plan:  Home/Self Care  In-House Referral:     Discharge planning Services     Post Acute Care Choice:    Choice offered to:     DME Arranged:    DME Agency:     HH Arranged:    HH Agency:     Status of Service:  Completed, signed off  If discussed at [employer] of Stay Meetings, dates discussed:    Additional Comments:  Elza Rafter, RN 08/17/2018, 3:55 PM

## 2018-08-17 NOTE — Discharge Summary (Signed)
[facility] NAME: [name]    MR#:  [id number]  DATE OF BIRTH:  June 06, 1956  DATE OF ADMISSION:  08/14/2018   ADMITTING PHYSICIAN: Lance Coon, MD  DATE OF DISCHARGE: 08/17/18  PRIMARY CARE PHYSICIAN: Patient, No Pcp Per   ADMISSION DIAGNOSIS:  Non-compliance with renal dialysis ([place]) [Z91.15] Chest pain, unspecified type [R07.9] DISCHARGE DIAGNOSIS:  Principal Problem:   ESRD on dialysis [facility]) Active Problems:   HLD (hyperlipidemia)   Hypertension  SECONDARY DIAGNOSIS:   Past Medical History:  Diagnosis Date  . Depression   . Heart attack ([place])   . Hypertension   . PE (pulmonary embolism)   . Renal disorder    HOSPITAL COURSE:   [name] is a 63 year old male who presented to the ED with chest pain in the setting of several missed dialysis sessions.  He also endorsed cocaine use.  In the ED, troponin was 0.12.  He was admitted for further management.  Chest pain- resolved.  Likely related to recent cocaine use and missed dialysis sessions. -Troponin mildly elevated, but flat -Cocaine cessation counseling provided this admission  ESRD on dialysis MWF -Received dialysis  while hospitalized -Outpatient dialysis set up at [facility] -Renvela 3 times daily started by nephro  Hypertension- BPs improved -Continued home Norvasc, labetalol, hydralazine  HLD- stable -Continued home statin  Tobacco abuse -Nicotine patch as needed  DISCHARGE CONDITIONS:  ESRD on dialysis Hypertension Hyperlipidemia Tobacco use Cocaine abuse CONSULTS OBTAINED:  Treatment Team:  Anthonette Legato, MD DRUG ALLERGIES:   Allergies  Allergen Reactions  . Cyclobenzaprine Nausea Only  . Clonidine Other (See Comments)    Asymptomatic bradycardia to 38  . Furosemide Other (See Comments)    Caused renal failure  . Tylenol [Acetaminophen] Other (See Comments)    Reaction:  Pt states it bothers his liver    DISCHARGE MEDICATIONS:   Allergies as of 08/17/2018      Reactions   Cyclobenzaprine Nausea Only   Clonidine Other (See Comments)   Asymptomatic bradycardia to 38   Furosemide Other (See Comments)   Caused renal failure   Tylenol [acetaminophen] Other (See Comments)   Reaction:  Pt states it bothers his liver      Medication List    TAKE these medications   amLODipine 10 MG tablet Commonly known as:  NORVASC Take 10 mg by mouth daily.   aspirin EC 81 MG tablet Take 81 mg by mouth daily.   atorvastatin 40 MG tablet Commonly known as:  LIPITOR Take 40 mg by mouth daily.   clopidogrel 75 MG tablet Commonly known as:  PLAVIX Take 75 mg by mouth daily.   hydrALAZINE 100 MG tablet Commonly known as:  APRESOLINE Take 1 tablet (100 mg total) by mouth 3 (three) times daily.   isosorbide mononitrate 60 MG 24 hr tablet Commonly known as:  IMDUR Take 1 tablet (60 mg total) by mouth daily.   labetalol 300 MG tablet Commonly known as:  NORMODYNE Take 1 tablet (300 mg total) by mouth 3 (three) times daily.   oxyCODONE 20 mg 12 hr tablet Commonly known as:  OXYCONTIN Take 20 mg by mouth every 12 (twelve) hours.   Oxycodone HCl 10 MG Tabs Take 10-20 mg by mouth every 4 (four) hours as needed (for pain).   polyethylene glycol packet Commonly known as:  MIRALAX / GLYCOLAX Take 17 g by mouth daily as needed for mild constipation.   senna-docusate  8.6-50 MG tablet Commonly known as:  Senokot-S Take 1 tablet by mouth 2 (two) times daily.      sevelamer carbonate 800 MG tablet Commonly known as:  RENVELA Take 3 tablets (2,400 mg total) by mouth 3 (three) times daily with meals.   sucralfate 1 g tablet Commonly known as:  CARAFATE Take 1 tablet (1 g total) by mouth 4 (four) times daily.   tamsulosin 0.4 MG Caps capsule Commonly known as:  FLOMAX Take 1 capsule (0.4 mg total) by mouth daily.   terazosin 5 MG capsule Commonly known as:  HYTRIN Take 1 capsule (5 mg  total) by mouth at bedtime.   traZODone 100 MG tablet Commonly known as:  DESYREL Take 100 mg by mouth at bedtime as needed for sleep.        DISCHARGE INSTRUCTIONS:  1.  Follow-up with PCP in 5 days 2.  Make sure you get dialysis Tuesday at 10:45 AM DIET:  Cardiac diet and Renal diet DISCHARGE CONDITION:  Stable ACTIVITY:  Activity as tolerated OXYGEN:  Home Oxygen: No.  Oxygen Delivery: room air DISCHARGE LOCATION:  home   If you experience worsening of your admission symptoms, develop shortness of breath, life threatening emergency, suicidal or homicidal thoughts you must seek medical attention immediately by calling 911 or calling your MD immediately  if symptoms less severe.  You Must read complete instructions/literature along with all the possible adverse reactions/side effects for all the Medicines you take and that have been prescribed to you. Take any new Medicines after you have completely understood and accpet all the possible adverse reactions/side effects.   Please note  You were cared for by a hospitalist during your hospital stay. If you have any questions about your discharge medications or the care you received while you were in the hospital after you are discharged, you can call the unit and asked to speak with the hospitalist on call if the hospitalist that took care of you is not available. Once you are discharged, your primary care physician will handle any further medical issues. Please note that NO REFILLS for any discharge medications will be authorized once you are discharged, as it is imperative that you return to your primary care physician (or establish a relationship with a primary care physician if you do not have one) for your aftercare needs so that they can reassess your need for medications and monitor your lab values.    On the day of Discharge:  VITAL SIGNS:  Blood pressure 130/75, pulse 67, temperature 98.5 F (36.9 C), temperature source  Oral, resp. rate 19, height 6' (1.829 m), weight 128.2 kg, SpO2 98 %. PHYSICAL EXAMINATION:  GENERAL:  63 y.o.-year-old patient lying in the bed with no acute distress.  EYES: Pupils equal, round, reactive to light and accommodation. No scleral icterus. Extraocular muscles intact.  HEENT: Head atraumatic, normocephalic. Oropharynx and nasopharynx clear.  NECK:  Supple, no jugular venous distention. No thyroid enlargement, no tenderness.  LUNGS: Normal breath sounds bilaterally, no wheezing, rales,rhonchi or crepitation. No use of accessory muscles of respiration.  CARDIOVASCULAR: S1, S2 normal. No murmurs, rubs, or gallops.  ABDOMEN: Soft, non-tender, non-distended. Bowel sounds present. No organomegaly or mass.  EXTREMITIES: No pedal edema, cyanosis, or clubbing.  NEUROLOGIC: Cranial nerves II through XII are intact. Muscle strength 5/5 in all extremities. Sensation intact. Gait not checked.  PSYCHIATRIC: The patient is alert and oriented x 3.  SKIN: No obvious rash, lesion, or ulcer.  DATA REVIEW:  CBC Recent Labs  Lab 08/15/18 0249  WBC 5.8  HGB 9.2*  HCT 27.9*  PLT 107*    Chemistries  Recent Labs  Lab 08/17/18 0549  NA 137  K 4.0  CL 100  CO2 26  GLUCOSE 97  BUN 65*  CREATININE 9.35*  CALCIUM 7.5*     Microbiology Results  Results for orders placed or performed during the hospital encounter of 08/14/18  MRSA PCR Screening     Status: None   Collection Time: 08/15/18  1:12 AM  Result Value Ref Range Status   MRSA by PCR NEGATIVE NEGATIVE Final    Comment:        The GeneXpert MRSA Assay (FDA approved for NASAL specimens only), is one component of a comprehensive MRSA colonization surveillance program. It is not intended to diagnose MRSA infection nor to guide or monitor treatment for MRSA infections. Performed at [facility], [address] [place], [place] [address]     RADIOLOGY:  No results found.   Management plans discussed with  the patient, family and they are in agreement.  CODE STATUS: Full Code   TOTAL TIME TAKING CARE OF THIS PATIENT: 40 minutes.    [name]  M.D on 08/17/2018 at 5:07 PM  Between 7am to 6pm - Pager [phone]  After 6pm go to [email or web address] - Proofreader  [facility]  Hospitalists  Office  [phone]  CC: Primary care physician; Patient, No Pcp Per   Note: This dictation was prepared with Dragon dictation along with smaller phrase technology. Any transcriptional errors that result from this process are unintentional.

## 2018-08-17 NOTE — Progress Notes (Signed)
[facility]  ROUNDING NOTE   Subjective:  Patient seen at bedside. Outpatient dialysis seat still being secured. Otherwise patient is due for dialysis today while here.   Objective:  Vital signs in last 24 hours:  Temp:  [97 F (36.1 C)-98.1 F (36.7 C)] 97.5 F (36.4 C) (02/07 0738) Pulse Rate:  [68-76] 69 (02/07 0738) Resp:  [18-20] 20 (02/07 0738) BP: (126-131)/(61-79) 127/79 (02/07 0738) SpO2:  [93 %-97 %] 97 % (02/07 0738) Weight:  [128.2 kg] 128.2 kg (02/07 0543)  Weight change: -4.47 kg Filed Weights   08/15/18 1403 08/16/18 0542 08/17/18 0543  Weight: 130.6 kg 128.6 kg 128.2 kg    Intake/Output: I/O last 3 completed shifts: In: 8 [P.O.:720] Out: 1400 [Urine:1400]   Intake/Output this shift:  Total I/O In: 240 [P.O.:240] Out: -   Physical Exam: General: No acute distress  Head: Normocephalic, atraumatic. Moist oral mucosal membranes  Eyes: Anicteric  Neck: Supple, trachea midline  Lungs:  Clear to auscultation, normal effort  Heart: S1S2 no rubs  Abdomen:  Soft, nontender, bowel sounds present  Extremities: 1+ peripheral edema.  Neurologic: Awake, alert, following commands  Skin: No lesions  Access: LUE AV access    Basic Metabolic Panel: Recent Labs  Lab 08/14/18 2115 08/15/18 0249 08/15/18 1117 08/16/18 0325 08/17/18 0549  NA 138 138  --  138 137  K 4.3 4.0  --  4.0 4.0  CL 100 101  --  100 100  CO2 25 25  --  28 26  GLUCOSE 99 117*  --  106* 97  BUN 76* 77*  --  60* 65*  CREATININE 11.30* 11.30*  --  8.41* 9.35*  CALCIUM 7.6* 7.3*  --  7.3* 7.5*  PHOS  --   --  8.0* 5.6* 5.9*  5.8*    Liver Function Tests: Recent Labs  Lab 08/16/18 0325 08/17/18 0549  ALBUMIN 3.2* 3.1*   No results for input(s): LIPASE, AMYLASE in the last 168 hours. No results for input(s): AMMONIA in the last 168 hours.  CBC: Recent Labs  Lab 08/14/18 2115 08/15/18 0249  WBC 6.2 5.8  NEUTROABS 4.0  --   HGB 9.8* 9.2*  HCT 29.3* 27.9*   MCV 85.9 85.8  PLT 120* 107*    Cardiac Enzymes: Recent Labs  Lab 08/14/18 2115 08/15/18 0249 08/15/18 0858  TROPONINI 0.12* 0.12* 0.11*    BNP: Invalid input(s): POCBNP  CBG: Recent Labs  Lab 08/14/18 2105  GLUCAP 104*    Microbiology: Results for orders placed or performed during the hospital encounter of 08/14/18  MRSA PCR Screening     Status: None   Collection Time: 08/15/18  1:12 AM  Result Value Ref Range Status   MRSA by PCR NEGATIVE NEGATIVE Final    Comment:        The GeneXpert MRSA Assay (FDA approved for NASAL specimens only), is one component of a comprehensive MRSA colonization surveillance program. It is not intended to diagnose MRSA infection nor to guide or monitor treatment for MRSA infections. Performed at [facility], [address] [place], [place] [address]     Coagulation Studies: No results for input(s): LABPROT, INR in the last 72 hours.  Urinalysis: No results for input(s): COLORURINE, LABSPEC, PHURINE, GLUCOSEU, HGBUR, BILIRUBINUR, KETONESUR, PROTEINUR, UROBILINOGEN, NITRITE, LEUKOCYTESUR in the last 72 hours.  Invalid input(s): APPERANCEUR    Imaging: No results found.   Medications:    . amLODipine  10 mg Oral Daily  .  aspirin EC  81 mg Oral Daily  . atorvastatin  40 mg Oral Daily  . heparin  5,000 Units Subcutaneous Q8H  . hydrALAZINE  100 mg Oral TID  . isosorbide mononitrate  60 mg Oral Daily  . labetalol  300 mg Oral TID  . nicotine  14 mg Transdermal Daily  . oxyCODONE  20 mg Oral Q12H  . sevelamer carbonate  1,600 mg Oral TID WC  . tamsulosin  0.4 mg Oral Daily  . terazosin  5 mg Oral QHS   acetaminophen **OR** acetaminophen, alum & mag hydroxide-simeth **AND** lidocaine, ipratropium-albuterol, ondansetron **OR** ondansetron (ZOFRAN) IV, oxyCODONE, traZODone  Assessment/ Plan:  63 y.o. male with a PMHx of depression, hypertension, pulmonary embolism, chronic kidney disease stage IV, who was  admitted to [facility] on 3/14/2018for evaluation of chest pain. Upon initial presentation he was found to have severe hypertension with a systolic blood pressure greater than 200.   1. End-stage renal disease: Patient due for dialysis today as an inpatient.  We are still attempting to secure outpatient dialysis seat for the patient.  2. Hypertension.  Under good control.  Continue amlodipine, hydralazine, labetalol, and [place] in.  3.  Anemia chronic kidney disease.  Hemoglobin currently 9.2.  Consider starting the patient on Epogen as an outpatient.  4.  Secondary hyperparathyroidism.  Increase Renvela to 3 tablets p.o. 3 times daily with meals.   LOS: 0 Donnah Levert 2/7/202012:59 PM

## 2018-08-17 NOTE — Progress Notes (Addendum)
Pt did have a discharged instructions today, but as per pt his dialysis was done late. Pt just got back from dialysis and states" I am not comfortable driving at night". As per pt report he drove himself to the hospital on this admission. Pt states that he wants to take his Flomax 0.4 mg. Pt BP was at 161/92 and HR 76. Notify Dr. Jannifer Franklin and ordered to give his Flomax which he did not take this afternoon. Will continue to monitor.

## 2018-08-18 NOTE — Plan of Care (Signed)
  Problem: Health Behavior/Discharge Planning: Goal: Ability to manage health-related needs will improve Outcome: Progressing   Problem: Education: Goal: Knowledge of disease and its progression will improve Outcome: Progressing   Problem: Health Behavior/Discharge Planning: Goal: Ability to manage health-related needs will improve Outcome: Progressing

## 2018-08-18 NOTE — Progress Notes (Signed)
[name] to be D/C'd Home per MD order.  Discussed prescriptions and follow up appointments with the patient. Prescriptions given to patient, medication list explained in detail. Pt verbalized understanding.  Allergies as of 08/18/2018      Reactions   Cyclobenzaprine Nausea Only   Clonidine Other (See Comments)   Asymptomatic bradycardia to 38   Furosemide Other (See Comments)   Caused renal failure   Tylenol [acetaminophen] Other (See Comments)   Reaction:  Pt states it bothers his liver      Medication List    TAKE these medications   amLODipine 10 MG tablet Commonly known as:  NORVASC Take 10 mg by mouth daily.   aspirin EC 81 MG tablet Take 81 mg by mouth daily.   atorvastatin 40 MG tablet Commonly known as:  LIPITOR Take 40 mg by mouth daily.   clopidogrel 75 MG tablet Commonly known as:  PLAVIX Take 75 mg by mouth daily.   hydrALAZINE 100 MG tablet Commonly known as:  APRESOLINE Take 1 tablet (100 mg total) by mouth 3 (three) times daily.   isosorbide mononitrate 60 MG 24 hr tablet Commonly known as:  IMDUR Take 1 tablet (60 mg total) by mouth daily.   labetalol 300 MG tablet Commonly known as:  NORMODYNE Take 1 tablet (300 mg total) by mouth 3 (three) times daily.   oxyCODONE 20 mg 12 hr tablet Commonly known as:  OXYCONTIN Take 20 mg by mouth every 12 (twelve) hours.   Oxycodone HCl 10 MG Tabs Take 10-20 mg by mouth every 4 (four) hours as needed (for pain).   polyethylene glycol packet Commonly known as:  MIRALAX / GLYCOLAX Take 17 g by mouth daily as needed for mild constipation.   senna-docusate 8.6-50 MG tablet Commonly known as:  Senokot-S Take 1 tablet by mouth 2 (two) times daily.   sevelamer carbonate 800 MG tablet Commonly known as:  RENVELA Take 2 tablets (1,600 mg total) by mouth 3 (three) times daily with meals.   sevelamer carbonate 800 MG tablet Commonly known as:  RENVELA Take 3 tablets (2,400 mg total) by mouth 3 (three)  times daily with meals.   sucralfate 1 g tablet Commonly known as:  CARAFATE Take 1 tablet (1 g total) by mouth 4 (four) times daily.   tamsulosin 0.4 MG Caps capsule Commonly known as:  FLOMAX Take 1 capsule (0.4 mg total) by mouth daily.   terazosin 5 MG capsule Commonly known as:  HYTRIN Take 1 capsule (5 mg total) by mouth at bedtime.   traZODone 100 MG tablet Commonly known as:  DESYREL Take 100 mg by mouth at bedtime as needed for sleep.       Vitals:   08/18/18 0426 08/18/18 0752  BP: 123/63 127/76  Pulse: 71 75  Resp:    Temp: 99.4 F (37.4 C) 98.5 F (36.9 C)  SpO2: 96% 99%    Skin clean, dry and intact without evidence of skin break down, no evidence of skin tears noted. IV catheter discontinued intact. Site without signs and symptoms of complications. Dressing and pressure applied. Pt denies pain at this time. No complaints noted.  An After Visit Summary was printed and given to the patient. Patient escorted via [place], and D/C home via private auto.  Rolley Sims

## 2018-08-19 ENCOUNTER — Other Ambulatory Visit: Payer: Self-pay

## 2018-08-19 ENCOUNTER — Emergency Department: Payer: Medicare Other

## 2018-08-19 ENCOUNTER — Observation Stay
Admission: EM | Admit: 2018-08-19 | Discharge: 2018-08-21 | Disposition: A | Discharge status: Home / Self Care | Payer: Medicare Other | Attending: Specialist | Admitting: Specialist

## 2018-08-19 DIAGNOSIS — Z96649 Presence of unspecified artificial hip joint: Secondary | ICD-10-CM | POA: Insufficient documentation

## 2018-08-19 DIAGNOSIS — Z79891 Long term (current) use of opiate analgesic: Secondary | ICD-10-CM | POA: Insufficient documentation

## 2018-08-19 DIAGNOSIS — Z765 Malingerer [conscious simulation]: Secondary | ICD-10-CM | POA: Insufficient documentation

## 2018-08-19 DIAGNOSIS — Z79899 Other long term (current) drug therapy: Secondary | ICD-10-CM | POA: Diagnosis not present

## 2018-08-19 DIAGNOSIS — Z888 Allergy status to other drugs, medicaments and biological substances status: Secondary | ICD-10-CM | POA: Insufficient documentation

## 2018-08-19 DIAGNOSIS — R9431 Abnormal electrocardiogram [ECG] [EKG]: Secondary | ICD-10-CM | POA: Diagnosis not present

## 2018-08-19 DIAGNOSIS — R079 Chest pain, unspecified: Secondary | ICD-10-CM | POA: Diagnosis present

## 2018-08-19 DIAGNOSIS — D631 Anemia in chronic kidney disease: Secondary | ICD-10-CM | POA: Insufficient documentation

## 2018-08-19 DIAGNOSIS — Z992 Dependence on renal dialysis: Secondary | ICD-10-CM | POA: Insufficient documentation

## 2018-08-19 DIAGNOSIS — F17210 Nicotine dependence, cigarettes, uncomplicated: Secondary | ICD-10-CM | POA: Diagnosis not present

## 2018-08-19 DIAGNOSIS — R0789 Other chest pain: Secondary | ICD-10-CM | POA: Diagnosis not present

## 2018-08-19 DIAGNOSIS — I2510 Atherosclerotic heart disease of native coronary artery without angina pectoris: Secondary | ICD-10-CM | POA: Diagnosis not present

## 2018-08-19 DIAGNOSIS — I313 Pericardial effusion (noninflammatory): Secondary | ICD-10-CM | POA: Diagnosis not present

## 2018-08-19 DIAGNOSIS — N186 End stage renal disease: Secondary | ICD-10-CM | POA: Insufficient documentation

## 2018-08-19 DIAGNOSIS — Z7982 Long term (current) use of aspirin: Secondary | ICD-10-CM | POA: Insufficient documentation

## 2018-08-19 DIAGNOSIS — R7989 Other specified abnormal findings of blood chemistry: Secondary | ICD-10-CM | POA: Insufficient documentation

## 2018-08-19 DIAGNOSIS — F329 Major depressive disorder, single episode, unspecified: Secondary | ICD-10-CM | POA: Diagnosis not present

## 2018-08-19 DIAGNOSIS — E785 Hyperlipidemia, unspecified: Secondary | ICD-10-CM | POA: Insufficient documentation

## 2018-08-19 DIAGNOSIS — I252 Old myocardial infarction: Secondary | ICD-10-CM | POA: Diagnosis not present

## 2018-08-19 DIAGNOSIS — Z86711 Personal history of pulmonary embolism: Secondary | ICD-10-CM | POA: Diagnosis not present

## 2018-08-19 DIAGNOSIS — N2581 Secondary hyperparathyroidism of renal origin: Secondary | ICD-10-CM | POA: Diagnosis not present

## 2018-08-19 DIAGNOSIS — N400 Enlarged prostate without lower urinary tract symptoms: Secondary | ICD-10-CM | POA: Insufficient documentation

## 2018-08-19 DIAGNOSIS — B1920 Unspecified viral hepatitis C without hepatic coma: Secondary | ICD-10-CM | POA: Diagnosis not present

## 2018-08-19 DIAGNOSIS — Z886 Allergy status to analgesic agent status: Secondary | ICD-10-CM | POA: Insufficient documentation

## 2018-08-19 DIAGNOSIS — F1414 Cocaine abuse with cocaine-induced mood disorder: Secondary | ICD-10-CM | POA: Diagnosis not present

## 2018-08-19 DIAGNOSIS — Z9119 Patient's noncompliance with other medical treatment and regimen: Secondary | ICD-10-CM | POA: Insufficient documentation

## 2018-08-19 DIAGNOSIS — I083 Combined rheumatic disorders of mitral, aortic and tricuspid valves: Secondary | ICD-10-CM | POA: Diagnosis not present

## 2018-08-19 DIAGNOSIS — I120 Hypertensive chronic kidney disease with stage 5 chronic kidney disease or end stage renal disease: Secondary | ICD-10-CM | POA: Diagnosis not present

## 2018-08-19 DIAGNOSIS — Z7902 Long term (current) use of antithrombotics/antiplatelets: Secondary | ICD-10-CM | POA: Insufficient documentation

## 2018-08-19 DIAGNOSIS — G8929 Other chronic pain: Secondary | ICD-10-CM | POA: Insufficient documentation

## 2018-08-19 LAB — COMPREHENSIVE METABOLIC PANEL
ALK PHOS: 47 U/L (ref 38–126)
ALT: 32 U/L (ref 0–44)
AST: 27 U/L (ref 15–41)
Albumin: 3.4 g/dL — ABNORMAL LOW (ref 3.5–5.0)
Anion gap: 9 (ref 5–15)
BILIRUBIN TOTAL: 0.6 mg/dL (ref 0.3–1.2)
BUN: 52 mg/dL — ABNORMAL HIGH (ref 6–20)
CALCIUM: 8.3 mg/dL — AB (ref 8.9–10.3)
CO2: 28 mmol/L (ref 22–32)
Chloride: 103 mmol/L (ref 98–111)
Creatinine, Ser: 8.51 mg/dL — ABNORMAL HIGH (ref 0.61–1.24)
GFR calc Af Amer: 7 mL/min — ABNORMAL LOW (ref >60)
GFR, EST NON AFRICAN AMERICAN: 6 mL/min — AB (ref >60)
Glucose, Bld: 113 mg/dL — ABNORMAL HIGH (ref 70–99)
Potassium: 4.2 mmol/L (ref 3.5–5.1)
Sodium: 140 mmol/L (ref 135–145)
Total Protein: 6.6 g/dL (ref 6.5–8.1)

## 2018-08-19 LAB — TROPONIN I: Troponin I: 0.07 ng/mL (ref <0.03)

## 2018-08-19 MED ORDER — MORPHINE SULFATE (PF) 4 MG/ML IV SOLN
4.0000 mg | Freq: Once | INTRAVENOUS | Status: AC
  Administered 2018-08-19: 4 mg via INTRAVENOUS
  Filled 2018-08-19: qty 1

## 2018-08-19 MED ORDER — ALBUTEROL SULFATE (2.5 MG/3ML) 0.083% IN NEBU
2.5000 mg | INHALATION_SOLUTION | Freq: Once | RESPIRATORY_TRACT | Status: AC
  Administered 2018-08-19: 2.5 mg via RESPIRATORY_TRACT
  Filled 2018-08-19: qty 3

## 2018-08-19 NOTE — ED Notes (Signed)
Redraw per lab; pt tolerated well; Dr Cherylann Banas in to see pt; pt had told me he had dialysis Friday night when he was in the hospital; admits to skipping dialysis for the last 2 weeks because "I am tired of going"; pt c/o chest pain and cough; sats 95% room air

## 2018-08-19 NOTE — ED Provider Notes (Signed)
[facility] Emergency Department Provider Note ____________________________________________   First MD Initiated Contact with Patient 08/19/18 2302     (approximate)  I have reviewed the triage vital signs and the nursing notes.   HISTORY  Chief Complaint Shortness of Breath    HPI [name] is a 63 y.o. male with PMH as noted below including end-stage renal disease on dialysis who presents with shortness of breath, acute onset today, persistent course, and associated with diffuse chest pain.  The patient states that his last dialysis was 2 days ago before he left the hospital and he was discharged yesterday.  He states his next dialysis is scheduled for 2 days from now.  He reports a nonproductive cough but denies fever.  He denies any cocaine or other illicit drug use.   Past Medical History:  Diagnosis Date  . Depression   . Heart attack ([place])   . Hypertension   . PE (pulmonary embolism)   . Renal disorder     Patient Active Problem List   Diagnosis Date Noted  . Chest pain 08/20/2018  . ESRD on dialysis ([place]) 11/08/2016  . Prostate cancer screening 11/08/2016  . Acquired cyst of kidney 11/08/2016  . Urinary urgency 11/08/2016  . Acute on chronic renal failure ([place]) 09/23/2016  . Hypertensive urgency 09/21/2016  . Dysthymia 02/26/2016  . Chronic pain 02/26/2016  . Noncompliance 02/26/2016  . Opiate abuse, continuous ([place]) 02/03/2016  . Substance induced mood disorder ([place]) 02/03/2016  . Antisocial personality disorder ([place]) 02/03/2016  . Left-sided weakness 01/18/2016  . HEPATITIS C 06/28/2008  . HLD (hyperlipidemia) 06/28/2008  . SUBSTANCE ABUSE, MULTIPLE 06/28/2008  . Hypertension 06/27/2008    Past Surgical History:  Procedure Laterality Date  . TOTAL HIP ARTHROPLASTY      Prior to Admission medications   Medication Sig Start Date End Date Taking? Authorizing Provider  amLODipine (NORVASC) 10 MG tablet Take 10 mg by  mouth daily. 11/27/15  Yes [provider]  aspirin EC 81 MG tablet Take 81 mg by mouth daily.   Yes [provider]  atorvastatin (LIPITOR) 40 MG tablet Take 40 mg by mouth at bedtime.  11/27/15  Yes [provider]  clopidogrel (PLAVIX) 75 MG tablet Take 75 mg by mouth daily. 06/21/16  Yes [provider]  hydrALAZINE (APRESOLINE) 100 MG tablet Take 1 tablet (100 mg total) by mouth 3 (three) times daily. 09/27/16  Yes Gladstone Lighter, MD  isosorbide mononitrate (IMDUR) 60 MG 24 hr tablet Take 1 tablet (60 mg total) by mouth daily. 09/28/16  Yes Gladstone Lighter, MD  labetalol (NORMODYNE) 300 MG tablet Take 1 tablet (300 mg total) by mouth 3 (three) times daily. 09/27/16  Yes Gladstone Lighter, MD  oxyCODONE (OXYCONTIN) 20 mg 12 hr tablet Take 20 mg by mouth every 12 (twelve) hours. 04/11/16  Yes [provider]  Oxycodone HCl 10 MG TABS Take 10-20 mg by mouth every 4 (four) hours as needed (for pain).  04/11/16  Yes [provider]  polyethylene glycol (MIRALAX / GLYCOLAX) packet Take 17 g by mouth daily as needed for mild constipation.  11/27/15  Yes [provider]  senna-docusate (SENOKOT-S) 8.6-50 MG tablet Take 1 tablet by mouth 2 (two) times daily. 09/27/16  Yes Gladstone Lighter, MD  sevelamer carbonate (RENVELA) 800 MG tablet Take 2 tablets (1,600 mg total) by mouth 3 (three) times daily with meals. 08/15/18  Yes Mayo, Pete Pelt, MD  sucralfate (CARAFATE) 1 g tablet  Take 1 tablet (1 g total) by mouth 4 (four) times daily. 05/29/16  Yes Carrie Mew, MD  tamsulosin (FLOMAX) 0.4 MG CAPS capsule Take 1 capsule (0.4 mg total) by mouth daily. 09/27/16  Yes Gladstone Lighter, MD  terazosin (HYTRIN) 5 MG capsule Take 1 capsule (5 mg total) by mouth at bedtime. 09/27/16  Yes Gladstone Lighter, MD  traZODone (DESYREL) 100 MG tablet Take 100 mg by mouth at bedtime as needed for sleep.  10/22/15  Yes [provider]  sevelamer  carbonate (RENVELA) 800 MG tablet Take 3 tablets (2,400 mg total) by mouth 3 (three) times daily with meals. Patient not taking: Reported on 08/20/2018 08/17/18   MayoPete Pelt, MD    Allergies Cyclobenzaprine; Clonidine; Furosemide; and Tylenol [acetaminophen]  No family history on file.  Social History Social History   Tobacco Use  . Smoking status: Current Every Day Smoker    Packs/day: 0.50    Types: Cigarettes  . Smokeless tobacco: Never Used  Substance Use Topics  . Alcohol use: No  . Drug use: No    Review of Systems  Constitutional: No fever. Eyes: No redness. ENT: No sore throat. Cardiovascular: Positive for chest pain. Respiratory: Positive for shortness of breath. Gastrointestinal: No vomiting.  Positive for diarrhea.  Genitourinary: Negative for dysuria.  Musculoskeletal: Negative for back pain. Skin: Negative for rash. Neurological: Negative for headache.   ____________________________________________   PHYSICAL EXAM:  VITAL SIGNS: ED Triage Vitals  Enc Vitals Group     BP 08/19/18 2225 (!) 165/90     Pulse Rate 08/19/18 2225 75     Resp 08/19/18 2225 20     Temp 08/19/18 2225 97.8 F (36.6 C)     Temp Source 08/19/18 2225 Oral     SpO2 08/19/18 2225 98 %     Weight 08/19/18 2226 280 lb (127 kg)     Height 08/19/18 2226 6' (1.829 m)     Head Circumference --      Peak Flow --      Pain Score 08/19/18 2226 8     Pain Loc --      Pain Edu? --      Excl. in [place]? --     Constitutional: Alert and oriented.  Uncomfortable appearing but in no acute distress. Eyes: Conjunctivae are normal.  Head: Atraumatic. Nose: No congestion/rhinnorhea. Mouth/Throat: Mucous membranes are slightly dry.   Neck: Normal range of motion.  Cardiovascular: Normal rate, regular rhythm. Grossly normal heart sounds.  Good peripheral circulation. Respiratory: Somewhat increased respiratory effort.  No retractions. Diffuse coarse breath sounds and wheezing.    Gastrointestinal: No distention.  Musculoskeletal: No lower extremity edema.  Extremities warm and well perfused.  Neurologic:  Normal speech and language. No gross focal neurologic deficits are appreciated.  Skin:  Skin is warm and dry. No rash noted. Psychiatric: Mood and affect are normal. Speech and behavior are normal.  ____________________________________________   LABS (all labs ordered are listed, but only abnormal results are displayed)  Labs Reviewed  TROPONIN I - Abnormal; Notable for the following components:      Result Value   Troponin I 0.07 (*)    All other components within normal limits  COMPREHENSIVE METABOLIC PANEL - Abnormal; Notable for the following components:   Glucose, Bld 113 (*)    BUN 52 (*)    Creatinine, Ser 8.51 (*)    Calcium 8.3 (*)    Albumin 3.4 (*)    GFR calc non  Af Amer 6 (*)    GFR calc Af Amer 7 (*)    All other components within normal limits  CBC - Abnormal; Notable for the following components:   RBC 3.18 (*)    Hemoglobin 9.2 (*)    HCT 28.5 (*)    Platelets 109 (*)    All other components within normal limits  URINE DRUG SCREEN, QUALITATIVE ([facility] ONLY) - Abnormal; Notable for the following components:   Cocaine Metabolite,Ur [place] POSITIVE (*)    Opiate, Ur Screen POSITIVE (*)    All other components within normal limits  INFLUENZA PANEL BY PCR (TYPE A & B)  URINALYSIS, COMPLETE (UACMP) WITH MICROSCOPIC   ____________________________________________  EKG  ED ECG REPORT I, Arta Silence, the attending physician, personally viewed and interpreted this ECG.  Date: 08/19/2018 EKG Time: 2227 Rate: 74 Rhythm: normal sinus rhythm QRS Axis: normal Intervals: normal ST/T Wave abnormalities: Nonspecific lateral T wave abnormalities Narrative Interpretation: no evidence of acute ischemia; no significant change when compared to EKG of 08/14/2018  ____________________________________________  RADIOLOGY  CXR: Bibasilar  opacities, atelectasis versus early infiltrate ____________________________________________   PROCEDURES  Procedure(s) performed: No  Procedures  Critical Care performed: No ____________________________________________   INITIAL IMPRESSION / ASSESSMENT AND PLAN / ED COURSE  Pertinent labs & imaging results that were available during my care of the patient were reviewed by me and considered in my medical decision making (see chart for details).  63 year old male with history of ESRD on dialysis and other PMH as noted above presents with shortness of breath and chest pain over the last day after he had his last dialysis 2 days ago.  He was discharged from the hospital yesterday morning.  I reviewed the past medical records in Epic; during this last hospitalization the patient had chest pain with elevated troponin which resolved and was thought to be likely related to cocaine use.  He also had been noncompliant with dialysis.  At this time, the patient denies any drug use since he left the hospital.  On exam he is hypertensive but his other vital signs are normal.  O2 saturation is in the high 90s on room air.  The patient appears somewhat uncomfortable and is moaning with each breath but has no acute respiratory distress.  His lungs demonstrate some coarse breath sounds and wheezing.  Differential includes fluid overload from ESRD, pneumonia, bronchitis, or less likely ACS.  We will obtain chest x-ray, lab work-up, influenza swab, give pain medication and bronchodilators and reassess.  ----------------------------------------- 3:14 AM on 08/20/2018 -----------------------------------------  The lab work-up is unremarkable.  Troponin is improved from prior.  The patient is negative for influenza.  However, on reassessment even after doses of both morphine and Dilaudid the patient reports persistent pain and shortness of breath.  I do not suspect PE as the patient has no tachycardia,  hypoxia, or leg swelling suggestive of DVT.  However given his elevated risk for ACS and his ESRD with persistent chest pain and shortness of breath, he is not stable for discharge at this time.  I will proceed with admission.  I signed him out to the hospitalist Dr. Marcille Blanco at approximately 3:10 AM. ____________________________________________   FINAL CLINICAL IMPRESSION(S) / ED DIAGNOSES  Final diagnoses:  Chest pain, unspecified type      NEW MEDICATIONS STARTED DURING THIS VISIT:  New Prescriptions   No medications on file     Note:  This document was prepared using Dragon voice recognition software and may  include unintentional dictation errors.    Arta Silence, MD 08/20/18 [phone]

## 2018-08-19 NOTE — ED Triage Notes (Signed)
Pt states has been shob with chest pain today. Pt is panting in triage, crackles noted in lll. Pt gets dialysis on TThSat. Pt denies fever, does have dry cough. When not panting can speak in clear full sentences.

## 2018-08-19 NOTE — ED Notes (Signed)
Patient transported to X-ray 

## 2018-08-19 NOTE — ED Notes (Signed)
Lab called with critical troponin of 0.07; reported to Dr Cherylann Banas who acknowledged report; no verbal orders given

## 2018-08-20 ENCOUNTER — Observation Stay
Admit: 2018-08-20 | Discharge: 2018-08-20 | Disposition: A | Discharge status: Home / Self Care | Payer: Medicare Other | Attending: Physician Assistant | Admitting: Physician Assistant

## 2018-08-20 DIAGNOSIS — R079 Chest pain, unspecified: Secondary | ICD-10-CM | POA: Diagnosis present

## 2018-08-20 LAB — URINALYSIS, COMPLETE (UACMP) WITH MICROSCOPIC
Bilirubin Urine: NEGATIVE
Glucose, UA: NEGATIVE mg/dL
Hgb urine dipstick: NEGATIVE
Ketones, ur: NEGATIVE mg/dL
Nitrite: POSITIVE — AB
PROTEIN: 100 mg/dL — AB
Specific Gravity, Urine: 1.01 (ref 1.005–1.030)
pH: 9 — ABNORMAL HIGH (ref 5.0–8.0)

## 2018-08-20 LAB — URINE DRUG SCREEN, QUALITATIVE (ARMC ONLY)
Amphetamines, Ur Screen: NOT DETECTED
BENZODIAZEPINE, UR SCRN: NOT DETECTED
Barbiturates, Ur Screen: NOT DETECTED
Cannabinoid 50 Ng, Ur ~~LOC~~: NOT DETECTED
Cocaine Metabolite,Ur ~~LOC~~: POSITIVE — AB
MDMA (Ecstasy)Ur Screen: NOT DETECTED
Methadone Scn, Ur: NOT DETECTED
Opiate, Ur Screen: POSITIVE — AB
PHENCYCLIDINE (PCP) UR S: NOT DETECTED
Tricyclic, Ur Screen: NOT DETECTED

## 2018-08-20 LAB — CBC
HCT: 28.5 % — ABNORMAL LOW (ref 39.0–52.0)
Hemoglobin: 9.2 g/dL — ABNORMAL LOW (ref 13.0–17.0)
MCH: 28.9 pg (ref 26.0–34.0)
MCHC: 32.3 g/dL (ref 30.0–36.0)
MCV: 89.6 fL (ref 80.0–100.0)
PLATELETS: 109 10*3/uL — AB (ref 150–400)
RBC: 3.18 MIL/uL — ABNORMAL LOW (ref 4.22–5.81)
RDW: 15.5 % (ref 11.5–15.5)
WBC: 5.8 10*3/uL (ref 4.0–10.5)
nRBC: 0 % (ref 0.0–0.2)

## 2018-08-20 LAB — TROPONIN I
Troponin I: 0.08 ng/mL (ref <0.03)
Troponin I: 0.06 ng/mL (ref <0.03)
Troponin I: 0.07 ng/mL (ref <0.03)

## 2018-08-20 LAB — INFLUENZA PANEL BY PCR (TYPE A & B)
Influenza A By PCR: NEGATIVE
Influenza B By PCR: NEGATIVE

## 2018-08-20 MED ORDER — ALBUTEROL SULFATE (2.5 MG/3ML) 0.083% IN NEBU
2.5000 mg | INHALATION_SOLUTION | Freq: Four times a day (QID) | RESPIRATORY_TRACT | Status: DC
  Administered 2018-08-20 – 2018-08-21 (×4): 2.5 mg via RESPIRATORY_TRACT
  Filled 2018-08-20 (×5): qty 3

## 2018-08-20 MED ORDER — ALBUTEROL SULFATE (2.5 MG/3ML) 0.083% IN NEBU
INHALATION_SOLUTION | RESPIRATORY_TRACT | Status: AC
  Administered 2018-08-20: 2.5 mg via RESPIRATORY_TRACT
  Filled 2018-08-20: qty 3

## 2018-08-20 MED ORDER — ALBUTEROL SULFATE (2.5 MG/3ML) 0.083% IN NEBU
2.5000 mg | INHALATION_SOLUTION | Freq: Once | RESPIRATORY_TRACT | Status: AC
  Administered 2018-08-20: 2.5 mg via RESPIRATORY_TRACT

## 2018-08-20 MED ORDER — TRAZODONE HCL 100 MG PO TABS: 100.0000 mg | ORAL_TABLET | Freq: Every evening | ORAL | Status: DC | PRN

## 2018-08-20 MED ORDER — ISOSORBIDE MONONITRATE ER 60 MG PO TB24
60.0000 mg | ORAL_TABLET | Freq: Every day | ORAL | Status: DC
  Administered 2018-08-20 – 2018-08-21 (×2): 60 mg via ORAL
  Filled 2018-08-20 (×2): qty 1

## 2018-08-20 MED ORDER — ONDANSETRON HCL 4 MG/2ML IJ SOLN
4.0000 mg | Freq: Four times a day (QID) | INTRAMUSCULAR | Status: DC | PRN
  Administered 2018-08-20: 4 mg via INTRAVENOUS
  Filled 2018-08-20: qty 2

## 2018-08-20 MED ORDER — OXYCODONE HCL 5 MG PO TABS
15.0000 mg | ORAL_TABLET | Freq: Four times a day (QID) | ORAL | Status: DC | PRN
  Administered 2018-08-20 – 2018-08-21 (×5): 15 mg via ORAL
  Filled 2018-08-20 (×5): qty 3

## 2018-08-20 MED ORDER — TAMSULOSIN HCL 0.4 MG PO CAPS
0.4000 mg | ORAL_CAPSULE | Freq: Every day | ORAL | Status: DC
  Administered 2018-08-20 – 2018-08-21 (×2): 0.4 mg via ORAL
  Filled 2018-08-20 (×2): qty 1

## 2018-08-20 MED ORDER — LORAZEPAM 2 MG/ML IJ SOLN
0.5000 mg | Freq: Once | INTRAMUSCULAR | Status: AC
  Administered 2018-08-20: 0.5 mg via INTRAVENOUS
  Filled 2018-08-20: qty 1

## 2018-08-20 MED ORDER — AMLODIPINE BESYLATE 10 MG PO TABS
10.0000 mg | ORAL_TABLET | Freq: Every day | ORAL | Status: DC
  Administered 2018-08-20 – 2018-08-21 (×2): 10 mg via ORAL
  Filled 2018-08-20 (×2): qty 1

## 2018-08-20 MED ORDER — TERAZOSIN HCL 5 MG PO CAPS
5.0000 mg | ORAL_CAPSULE | Freq: Every day | ORAL | Status: DC
  Administered 2018-08-20: 5 mg via ORAL
  Filled 2018-08-20 (×2): qty 1

## 2018-08-20 MED ORDER — LABETALOL HCL 200 MG PO TABS
300.0000 mg | ORAL_TABLET | Freq: Three times a day (TID) | ORAL | Status: DC
  Administered 2018-08-20 (×3): 300 mg via ORAL
  Filled 2018-08-20 (×6): qty 1

## 2018-08-20 MED ORDER — OXYCODONE HCL ER 20 MG PO T12A
20.0000 mg | EXTENDED_RELEASE_TABLET | Freq: Two times a day (BID) | ORAL | Status: DC
  Administered 2018-08-20 – 2018-08-21 (×3): 20 mg via ORAL
  Filled 2018-08-20 (×3): qty 1

## 2018-08-20 MED ORDER — SUCRALFATE 1 G PO TABS
1.0000 g | ORAL_TABLET | Freq: Four times a day (QID) | ORAL | Status: DC
  Administered 2018-08-20 (×4): 1 g via ORAL
  Filled 2018-08-20 (×4): qty 1

## 2018-08-20 MED ORDER — DOCUSATE SODIUM 100 MG PO CAPS
100.0000 mg | ORAL_CAPSULE | Freq: Two times a day (BID) | ORAL | Status: DC
  Administered 2018-08-20 (×2): 100 mg via ORAL
  Filled 2018-08-20 (×2): qty 1

## 2018-08-20 MED ORDER — SEVELAMER CARBONATE 800 MG PO TABS
1600.0000 mg | ORAL_TABLET | Freq: Three times a day (TID) | ORAL | Status: DC
  Administered 2018-08-20 (×3): 1600 mg via ORAL
  Filled 2018-08-20 (×4): qty 2

## 2018-08-20 MED ORDER — HYDRALAZINE HCL 50 MG PO TABS
100.0000 mg | ORAL_TABLET | Freq: Three times a day (TID) | ORAL | Status: DC
  Administered 2018-08-20 (×4): 100 mg via ORAL
  Filled 2018-08-20 (×4): qty 2

## 2018-08-20 MED ORDER — ATORVASTATIN CALCIUM 20 MG PO TABS
40.0000 mg | ORAL_TABLET | Freq: Every day | ORAL | Status: DC
  Administered 2018-08-20: 40 mg via ORAL
  Filled 2018-08-20: qty 2

## 2018-08-20 MED ORDER — HYDROMORPHONE HCL 1 MG/ML IJ SOLN
1.0000 mg | Freq: Once | INTRAMUSCULAR | Status: AC
  Administered 2018-08-20: 1 mg via INTRAVENOUS

## 2018-08-20 MED ORDER — HYDROMORPHONE HCL 1 MG/ML IJ SOLN
INTRAMUSCULAR | Status: AC
  Filled 2018-08-20: qty 1

## 2018-08-20 MED ORDER — HEPARIN SODIUM (PORCINE) 5000 UNIT/ML IJ SOLN
5000.0000 [IU] | Freq: Three times a day (TID) | INTRAMUSCULAR | Status: DC
  Administered 2018-08-20 – 2018-08-21 (×3): 5000 [IU] via SUBCUTANEOUS
  Filled 2018-08-20 (×3): qty 1

## 2018-08-20 MED ORDER — POLYETHYLENE GLYCOL 3350 17 G PO PACK: 17.0000 g | PACK | Freq: Every day | ORAL | Status: DC | PRN

## 2018-08-20 MED ORDER — CLOPIDOGREL BISULFATE 75 MG PO TABS
75.0000 mg | ORAL_TABLET | Freq: Every day | ORAL | Status: DC
  Administered 2018-08-20 – 2018-08-21 (×2): 75 mg via ORAL
  Filled 2018-08-20 (×2): qty 1

## 2018-08-20 MED ORDER — ONDANSETRON HCL 4 MG PO TABS: 4.0000 mg | ORAL_TABLET | Freq: Four times a day (QID) | ORAL | Status: DC | PRN

## 2018-08-20 MED ORDER — SENNOSIDES-DOCUSATE SODIUM 8.6-50 MG PO TABS
1.0000 | ORAL_TABLET | Freq: Two times a day (BID) | ORAL | Status: DC
  Administered 2018-08-20 (×2): 1 via ORAL
  Filled 2018-08-20 (×2): qty 1

## 2018-08-20 MED ORDER — ASPIRIN EC 81 MG PO TBEC
81.0000 mg | DELAYED_RELEASE_TABLET | Freq: Every day | ORAL | Status: DC
  Administered 2018-08-20 – 2018-08-21 (×2): 81 mg via ORAL
  Filled 2018-08-20 (×2): qty 1

## 2018-08-20 NOTE — Progress Notes (Signed)
[name] at [place] NAME: [name]    MR#:  [id number]  DATE OF BIRTH:  [birth year]  SUBJECTIVE:   Patient admitted to the hospital secondary to chest pain.  Still complaining of some vague chest pain.  Troponins are mildly elevated.    REVIEW OF SYSTEMS:    Review of Systems  Constitutional: Negative for chills and fever.  HENT: Negative for congestion and tinnitus.   Eyes: Negative for blurred vision and double vision.  Respiratory: Negative for cough, shortness of breath and wheezing.   Cardiovascular: Negative for chest pain, orthopnea and PND.  Gastrointestinal: Negative for abdominal pain, diarrhea, nausea and vomiting.  Genitourinary: Negative for dysuria and hematuria.  Neurological: Negative for dizziness, sensory change and focal weakness.  All other systems reviewed and are negative.   Nutrition: Heart Healthy/Renal Tolerating Diet: Yes Tolerating PT: Ambulatory  DRUG ALLERGIES:   Allergies  Allergen Reactions  . Cyclobenzaprine Nausea Only  . Clonidine Other (See Comments)    Asymptomatic bradycardia to 38  . Furosemide Other (See Comments)    Caused renal failure  . Tylenol [Acetaminophen] Other (See Comments)    Reaction:  Pt states it bothers his liver    VITALS:  Blood pressure (!) 147/89, pulse 81, temperature 97.9 F (36.6 C), temperature source Oral, resp. rate 19, height 6' (1.829 m), weight 127 kg, SpO2 100 %.  PHYSICAL EXAMINATION:   Physical Exam  GENERAL:  63 y.o.-year-old patient lying in bed in no acute distress.  EYES: Pupils equal, round, reactive to light and accommodation. No scleral icterus. Extraocular muscles intact.  HEENT: Head atraumatic, normocephalic. Oropharynx and nasopharynx clear.  NECK:  Supple, no jugular venous distention. No thyroid enlargement, no tenderness.  LUNGS: Normal breath sounds bilaterally, no wheezing, rales, rhonchi. No use of accessory muscles of  respiration.  CARDIOVASCULAR: S1, S2 normal. No murmurs, rubs, or gallops.  ABDOMEN: Soft, nontender, nondistended. Bowel sounds present. No organomegaly or mass.  EXTREMITIES: No cyanosis, clubbing or edema b/l.    NEUROLOGIC: Cranial nerves II through XII are intact. No focal Motor or sensory deficits b/l.   PSYCHIATRIC: The patient is alert and oriented x 3.  SKIN: No obvious rash, lesion, or ulcer.    LABORATORY PANEL:   CBC Recent Labs  Lab 08/19/18 2331  WBC 5.8  HGB 9.2*  HCT 28.5*  PLT 109*   ------------------------------------------------------------------------------------------------------------------  Chemistries  Recent Labs  Lab 08/19/18 2236  NA 140  K 4.2  CL 103  CO2 28  GLUCOSE 113*  BUN 52*  CREATININE 8.51*  CALCIUM 8.3*  AST 27  ALT 32  ALKPHOS 47  BILITOT 0.6   ------------------------------------------------------------------------------------------------------------------  Cardiac Enzymes Recent Labs  Lab 08/20/18 0945  TROPONINI 0.06*   ------------------------------------------------------------------------------------------------------------------  RADIOLOGY:  Dg Chest 2 View  Result Date: 08/19/2018 CLINICAL DATA:  Shortness of breath and chest pain, initial encounter EXAM: CHEST - 2 VIEW COMPARISON:  08/14/2018 FINDINGS: Cardiac shadow is mildly in size but stable from the previous exam. Mild bibasilar atelectasis/infiltrate is noted. No sizable effusion is seen. No bony abnormality is noted. IMPRESSION: Bibasilar opacities consistent with early infiltrate or atelectasis. Electronically Signed   By: Inez Catalina M.D.   On: 08/19/2018 23:21     ASSESSMENT AND PLAN:   63 year old male with past medical history of end-stage renal disease on hemodialysis, hypertension, history of pulmonary embolism, polysubstance abuse, coronary artery disease who was recently discharged from the hospital returns  back due to chest pain.  1.  Chest  pain- suspected to be secondary to noncompliance and also secondary to cocaine abuse. - Patient's chest pain is all the very atypical, his markers only mildly elevated, EKG shows no acute ST or T wave changes. - Seen by cardiology and no plans for further intervention.  Echocardiogram pending. -Continue ASA, Plavix, Atorvastatin, Imdur -Suspect this is secondary to patient's cocaine abuse and noncompliance.  2.  End-stage renal disease on hemodialysis-nephrology has been consulted.  Continue dialysis on a Tuesday Thursday Saturday schedule.  Patient is noncompliant.  He supposedly missed his dialysis this past Saturday.  3.  Essential hypertension-continue Norvasc, Imdur, hydralazine, labetalol.  4.  Chronic pain-continue patient's oxycodone and OxyContin. -High drug-seeking behavior.  Would avoid IV narcotics.  5.  BPH-continue Flomax, Terazosin  6.  Secondary hyperparathyroidism-continue Renvela.   All the records are reviewed and case discussed with Care Management/Social Worker. Management plans discussed with the patient, family and they are in agreement.  CODE STATUS: Full code  DVT Prophylaxis: Hep. SQ  TOTAL TIME TAKING CARE OF THIS PATIENT: 30 minutes.   POSSIBLE D/C IN 1-2 DAYS, DEPENDING ON CLINICAL CONDITION.   Henreitta Leber M.D on 08/20/2018 at 1:02 PM  Between 7am to 6pm - Pager - [phone]  After 6pm go to [email or web address] - Proofreader  [facility]  Office  [phone]  CC: Primary care physician; Patient, No Pcp Per

## 2018-08-20 NOTE — ED Notes (Signed)
Pt using call bell; asking for more ice water; pt had forgotten there was ice water on his table right beside the bed

## 2018-08-20 NOTE — Plan of Care (Signed)

## 2018-08-20 NOTE — Progress Notes (Signed)
*  PRELIMINARY RESULTS* Echocardiogram 2D Echocardiogram has been performed.  [name] 08/20/2018, 3:06 PM

## 2018-08-20 NOTE — Care Management Obs Status (Signed)
MEDICARE OBSERVATION STATUS NOTIFICATION   Patient Details  Name: CHRISTHOPER BUSBEE MRN: [id number] Date of Birth: 12/15/1955   Medicare Observation Status Notification Given:  Yes    Elza Rafter, RN 08/20/2018, 5:31 PM

## 2018-08-20 NOTE — H&P (Addendum)
[name] is an 63 y.o. male.   Chief Complaint: Chest pain HPI: The patient with past medical history of ESRD, hypertension, CAD status post MI, hepatitis C and hyperlipidemia presents to the emergency department complaining of chest pain.  The patient was admitted earlier this week for the same.  Etiology was found to be pulmonary congestion secondary to missed dialysis as well as cocaine use.  On this occasion the patient denies cocaine use.  He reports that the pain began yesterday but has lasted throughout the night and is to much to bear.  He denies shortness of breath at rest.  He describes the pain as right-sided as if "somebody kicked him in the chest".  The patient also denies nausea, vomiting or diaphoresis.  Laboratory evaluation revealed elevated troponin.  Due to ongoing chest pain and cardiac risk factors emergency department staff call hospitalist service for admission.  Past Medical History:  Diagnosis Date  . Depression   . Heart attack ([place])   . Hypertension   . PE (pulmonary embolism)   . Renal disorder     Past Surgical History:  Procedure Laterality Date  . TOTAL HIP ARTHROPLASTY      No family history on file. Family history of diabetes Social History:  reports that he has been smoking cigarettes. He has been smoking about 0.50 packs per day. He has never used smokeless tobacco. He reports that he does not drink alcohol or use drugs.  Allergies:  Allergies  Allergen Reactions  . Cyclobenzaprine Nausea Only  . Clonidine Other (See Comments)    Asymptomatic bradycardia to 38  . Furosemide Other (See Comments)    Caused renal failure  . Tylenol [Acetaminophen] Other (See Comments)    Reaction:  Pt states it bothers his liver    Medications Prior to Admission  Medication Sig Dispense Refill  . amLODipine (NORVASC) 10 MG tablet Take 10 mg by mouth daily.  11  . aspirin EC 81 MG tablet Take 81 mg by mouth daily.    Marland Kitchen atorvastatin (LIPITOR) 40 MG tablet  Take 40 mg by mouth at bedtime.   0  . clopidogrel (PLAVIX) 75 MG tablet Take 75 mg by mouth daily.    . hydrALAZINE (APRESOLINE) 100 MG tablet Take 1 tablet (100 mg total) by mouth 3 (three) times daily. 90 tablet 2  . isosorbide mononitrate (IMDUR) 60 MG 24 hr tablet Take 1 tablet (60 mg total) by mouth daily. 30 tablet 2  . labetalol (NORMODYNE) 300 MG tablet Take 1 tablet (300 mg total) by mouth 3 (three) times daily. 90 tablet 2  . oxyCODONE (OXYCONTIN) 20 mg 12 hr tablet Take 20 mg by mouth every 12 (twelve) hours.    . Oxycodone HCl 10 MG TABS Take 10-20 mg by mouth every 4 (four) hours as needed (for pain).     . polyethylene glycol (MIRALAX / GLYCOLAX) packet Take 17 g by mouth daily as needed for mild constipation.   0  . senna-docusate (SENOKOT-S) 8.6-50 MG tablet Take 1 tablet by mouth 2 (two) times daily. 30 tablet 2  . sevelamer carbonate (RENVELA) 800 MG tablet Take 2 tablets (1,600 mg total) by mouth 3 (three) times daily with meals. 180 tablet 0  . sucralfate (CARAFATE) 1 g tablet Take 1 tablet (1 g total) by mouth 4 (four) times daily. 120 tablet 1  . tamsulosin (FLOMAX) 0.4 MG CAPS capsule Take 1 capsule (0.4 mg total) by mouth daily. 30 capsule 5  .  terazosin (HYTRIN) 5 MG capsule Take 1 capsule (5 mg total) by mouth at bedtime. 30 capsule 2  . traZODone (DESYREL) 100 MG tablet Take 100 mg by mouth at bedtime as needed for sleep.     . sevelamer carbonate (RENVELA) 800 MG tablet Take 3 tablets (2,400 mg total) by mouth 3 (three) times daily with meals. (Patient not taking: Reported on 08/20/2018) 270 tablet 0    Results for orders placed or performed during the hospital encounter of 08/19/18 (from the past 48 hour(s))  Troponin I - ONCE - STAT     Status: Abnormal   Collection Time: 08/19/18 10:36 PM  Result Value Ref Range   Troponin I 0.07 (HH) <0.03 ng/mL    Comment: CRITICAL RESULT CALLED TO, READ BACK BY AND VERIFIED WITH LORI ALLEN AT 2316 08/19/2018.  TFK Performed  at [facility], [address] [place], [place] [address]   Comprehensive metabolic panel     Status: Abnormal   Collection Time: 08/19/18 10:36 PM  Result Value Ref Range   Sodium 140 135 - 145 mmol/L   Potassium 4.2 3.5 - 5.1 mmol/L   Chloride 103 98 - 111 mmol/L   CO2 28 22 - 32 mmol/L   Glucose, Bld 113 (H) 70 - 99 mg/dL   BUN 52 (H) 6 - 20 mg/dL   Creatinine, Ser 8.51 (H) 0.61 - 1.24 mg/dL   Calcium 8.3 (L) 8.9 - 10.3 mg/dL   Total Protein 6.6 6.5 - 8.1 g/dL   Albumin 3.4 (L) 3.5 - 5.0 g/dL   AST 27 15 - 41 U/L   ALT 32 0 - 44 U/L   Alkaline Phosphatase 47 38 - 126 U/L   Total Bilirubin 0.6 0.3 - 1.2 mg/dL   GFR calc non Af Amer 6 (L) >60 mL/min   GFR calc Af Amer 7 (L) >60 mL/min   Anion gap 9 5 - 15    Comment: Performed at [facility], [address] [place], [place] [address]  CBC     Status: Abnormal   Collection Time: 08/19/18 11:31 PM  Result Value Ref Range   WBC 5.8 4.0 - 10.5 K/uL   RBC 3.18 (L) 4.22 - 5.81 MIL/uL   Hemoglobin 9.2 (L) 13.0 - 17.0 g/dL   HCT 28.5 (L) 39.0 - 52.0 %   MCV 89.6 80.0 - 100.0 fL   MCH 28.9 26.0 - 34.0 pg   MCHC 32.3 30.0 - 36.0 g/dL   RDW 15.5 11.5 - 15.5 %   Platelets 109 (L) 150 - 400 K/uL    Comment: Immature Platelet Fraction may be clinically indicated, consider ordering this additional test [id number]    nRBC 0.0 0.0 - 0.2 %    Comment: Performed at [facility], [address] [place], [place] [address]  Influenza panel by PCR (type A & B)     Status: None   Collection Time: 08/20/18 12:03 AM  Result Value Ref Range   Influenza A By PCR NEGATIVE NEGATIVE   Influenza B By PCR NEGATIVE NEGATIVE    Comment: (NOTE) The Xpert Xpress Flu assay is intended as an aid in the diagnosis of  influenza and should not be used as a sole basis for treatment.  This  assay is FDA approved for nasopharyngeal swab specimens only. Nasal  washings and aspirates are unacceptable for Xpert Xpress Flu  testing. Performed at [facility], [address] [place], [place] [address]   Urinalysis,  Complete w Microscopic     Status: Abnormal   Collection Time: 08/20/18  2:30 AM  Result Value Ref Range   Color, Urine YELLOW (A) YELLOW   APPearance CLOUDY (A) CLEAR   Specific Gravity, Urine 1.010 1.005 - 1.030   pH 9.0 (H) 5.0 - 8.0   Glucose, UA NEGATIVE NEGATIVE mg/dL   Hgb urine dipstick NEGATIVE NEGATIVE   Bilirubin Urine NEGATIVE NEGATIVE   Ketones, ur NEGATIVE NEGATIVE mg/dL   Protein, ur 100 (A) NEGATIVE mg/dL   Nitrite POSITIVE (A) NEGATIVE   Leukocytes, UA LARGE (A) NEGATIVE   RBC / HPF 6-10 0 - 5 RBC/hpf   WBC, UA >50 (H) 0 - 5 WBC/hpf   Bacteria, UA RARE (A) NONE SEEN   Squamous Epithelial / LPF 0-5 0 - 5   Triple Phosphate Crystal PRESENT     Comment: Performed at [facility], [address] [place], [place] [address]  Urine Drug Screen, Qualitative     Status: Abnormal   Collection Time: 08/20/18  2:30 AM  Result Value Ref Range   Tricyclic, Ur Screen NONE DETECTED NONE DETECTED   Amphetamines, Ur Screen NONE DETECTED NONE DETECTED   MDMA (Ecstasy)Ur Screen NONE DETECTED NONE DETECTED   Cocaine Metabolite,Ur [place] POSITIVE (A) NONE DETECTED   Opiate, Ur Screen POSITIVE (A) NONE DETECTED   Phencyclidine (PCP) Ur S NONE DETECTED NONE DETECTED   Cannabinoid 50 Ng, Ur [place] NONE DETECTED NONE DETECTED   Barbiturates, Ur Screen NONE DETECTED NONE DETECTED   Benzodiazepine, Ur Scrn NONE DETECTED NONE DETECTED   Methadone Scn, Ur NONE DETECTED NONE DETECTED    Comment: (NOTE) Tricyclics + metabolites, urine    Cutoff 1000 ng/mL Amphetamines + metabolites, urine  Cutoff 1000 ng/mL MDMA (Ecstasy), urine              Cutoff 500 ng/mL Cocaine Metabolite, urine          Cutoff 300 ng/mL Opiate + metabolites, urine        Cutoff 300 ng/mL Phencyclidine (PCP), urine         Cutoff 25 ng/mL Cannabinoid, urine                 Cutoff 50 ng/mL Barbiturates +  metabolites, urine  Cutoff 200 ng/mL Benzodiazepine, urine              Cutoff 200 ng/mL Methadone, urine                   Cutoff 300 ng/mL The urine drug screen provides only a preliminary, unconfirmed analytical test result and should not be used for non-medical purposes. Clinical consideration and professional judgment should be applied to any positive drug screen result due to possible interfering substances. A more specific alternate chemical method must be used in order to obtain a confirmed analytical result. Gas chromatography / mass spectrometry (GC/MS) is the preferred confirmat ory method. Performed at [facility], [address] [place], [place] [address]   Troponin I - Now Then Q6H     Status: Abnormal   Collection Time: 08/20/18  4:12 AM  Result Value Ref Range   Troponin I 0.08 (HH) <0.03 ng/mL    Comment: CRITICAL VALUE NOTED. VALUE IS CONSISTENT WITH PREVIOUSLY REPORTED/CALLED VALUE.  TFK Performed at [facility], [address] [place], [place]  [address]    Dg Chest 2 View  Result Date: 08/19/2018 CLINICAL DATA:  Shortness of breath and chest pain, initial encounter EXAM:  CHEST - 2 VIEW COMPARISON:  08/14/2018 FINDINGS: Cardiac shadow is mildly in size but stable from the previous exam. Mild bibasilar atelectasis/infiltrate is noted. No sizable effusion is seen. No bony abnormality is noted. IMPRESSION: Bibasilar opacities consistent with early infiltrate or atelectasis. Electronically Signed   By: Inez Catalina M.D.   On: 08/19/2018 23:21    Review of Systems  Constitutional: Negative for chills and fever.  HENT: Negative for sore throat and tinnitus.   Eyes: Negative for blurred vision and redness.  Respiratory: Negative for cough and shortness of breath.   Cardiovascular: Positive for chest pain. Negative for palpitations, orthopnea and PND.  Gastrointestinal: Negative for abdominal pain, diarrhea, nausea and vomiting.  Genitourinary:  Negative for dysuria, frequency and urgency.  Musculoskeletal: Negative for joint pain and myalgias.  Skin: Negative for rash.       No lesions  Neurological: Negative for speech change, focal weakness and weakness.  Endo/Heme/Allergies: Does not bruise/bleed easily.       No temperature intolerance  Psychiatric/Behavioral: Negative for depression and suicidal ideas.    Blood pressure (!) 165/102, pulse 81, temperature 98.3 F (36.8 C), temperature source Oral, resp. rate (!) 22, height 6' (1.829 m), weight 127 kg, SpO2 100 %. Physical Exam  Vitals reviewed. Constitutional: He is oriented to person, place, and time. He appears well-developed and well-nourished. No distress.  HENT:  Head: Normocephalic and atraumatic.  Mouth/Throat: Oropharynx is clear and moist.  Eyes: Pupils are equal, round, and reactive to light. Conjunctivae and EOM are normal. No scleral icterus.  Neck: Normal range of motion. Neck supple. No JVD present. No tracheal deviation present. No thyromegaly present.  Cardiovascular: Normal rate, regular rhythm and normal heart sounds. Exam reveals no gallop and no friction rub.  No murmur heard. Respiratory: Effort normal and breath sounds normal. No respiratory distress.  GI: Soft. Bowel sounds are normal. He exhibits no distension. There is no abdominal tenderness.  Genitourinary:    Genitourinary Comments: Deferred   Musculoskeletal: Normal range of motion.        General: No edema.  Lymphadenopathy:    He has no cervical adenopathy.  Neurological: He is alert and oriented to person, place, and time. No cranial nerve deficit.  Skin: Skin is warm and dry. No rash noted. No erythema.  Psychiatric: He has a normal mood and affect. His behavior is normal. Judgment and thought content normal.     Assessment/Plan This is a 63 year old male admitted for chest pain. 1.  Chest pain: Atypical in duration and quality.  Nonetheless, troponin is elevated albeit less than on  previous admission.  Continue to follow cardiac biomarkers.  Monitor telemetry.  Consult cardiology. 2.  CAD: Rule out ACS.  Continue aspirin and Plavix.  Also continue Imdur 3.  Hypertension: Uncontrolled; give terazosin and hydralazine meds early.  If urine tox is negative for cocaine may use labetalol as needed 4.  ESRD on dialysis: Consult nephrology for continuation of dialysis.  Continue Renvela 5.  BPH: Continue tamsulosin 6.  Hyperlipidemia: Continue statin therapy 7.  DVT prophylaxis: Heparin 8.  GI prophylaxis: None The patient is a full code.  Time spent on admission orders and patient care approximately 45 minutes  Harrie Foreman, MD 08/20/2018, 7:44 AM

## 2018-08-20 NOTE — Progress Notes (Signed)
[facility]  ROUNDING NOTE   Subjective:   Mr. DAVIED NOCITO admitted to [facility] on 08/19/2018 for Chest pain, unspecified type [R07.9]  Patient was discharged on 2/7. Last hemodialysis treatment was on that day, 2/7. Missed his outpatient appointment scheduled for last Saturday, 2/8.   Patient states he is having shortness of breath and chest pain. He is refusing dialysis today.    Objective:  Vital signs in last 24 hours:  Temp:  [97.8 F (36.6 C)-98.3 F (36.8 C)] 97.9 F (36.6 C) (02/10 0830) Pulse Rate:  [75-82] 81 (02/10 0830) Resp:  [16-29] 19 (02/10 0830) BP: (144-174)/(88-105) 147/89 (02/10 0830) SpO2:  [94 %-100 %] 100 % (02/10 0830) Weight:  [409 kg] 127 kg (02/09 2226)  Weight change:  Filed Weights   08/19/18 2226  Weight: 127 kg    Intake/Output: No intake/output data recorded.   Intake/Output this shift:  Total I/O In: 720 [P.O.:720] Out: 350 [Urine:350]  Physical Exam: General: No acute distress  Head: Normocephalic, atraumatic. Moist oral mucosal membranes  Eyes: Anicteric  Neck: Supple, trachea midline  Lungs:  Basilar crackles  Heart: S1S2 no rubs  Abdomen:  Soft, nontender, bowel sounds present  Extremities: 1+ peripheral edema.  Neurologic: Awake, alert, following commands  Skin: No lesions  Access: LUE AV Fistula    Basic Metabolic Panel: Recent Labs  Lab 08/14/18 2115 08/15/18 0249 08/15/18 1117 08/16/18 0325 08/17/18 0549 08/19/18 2236  NA 138 138  --  138 137 140  K 4.3 4.0  --  4.0 4.0 4.2  CL 100 101  --  100 100 103  CO2 25 25  --  28 26 28   GLUCOSE 99 117*  --  106* 97 113*  BUN 76* 77*  --  60* 65* 52*  CREATININE 11.30* 11.30*  --  8.41* 9.35* 8.51*  CALCIUM 7.6* 7.3*  --  7.3* 7.5* 8.3*  PHOS  --   --  8.0* 5.6* 5.9*  5.8*  --     Liver Function Tests: Recent Labs  Lab 08/16/18 0325 08/17/18 0549 08/19/18 2236  AST  --   --  27  ALT  --   --  32  ALKPHOS  --   --  47  BILITOT  --   --  0.6   PROT  --   --  6.6  ALBUMIN 3.2* 3.1* 3.4*   No results for input(s): LIPASE, AMYLASE in the last 168 hours. No results for input(s): AMMONIA in the last 168 hours.  CBC: Recent Labs  Lab 08/14/18 2115 08/15/18 0249 08/19/18 2331  WBC 6.2 5.8 5.8  NEUTROABS 4.0  --   --   HGB 9.8* 9.2* 9.2*  HCT 29.3* 27.9* 28.5*  MCV 85.9 85.8 89.6  PLT 120* 107* 109*    Cardiac Enzymes: Recent Labs  Lab 08/15/18 0249 08/15/18 0858 08/19/18 2236 08/20/18 0412 08/20/18 0945  TROPONINI 0.12* 0.11* 0.07* 0.08* 0.06*    BNP: Invalid input(s): POCBNP  CBG: Recent Labs  Lab 08/14/18 2105  GLUCAP 104*    Microbiology: Results for orders placed or performed during the hospital encounter of 08/14/18  MRSA PCR Screening     Status: None   Collection Time: 08/15/18  1:12 AM  Result Value Ref Range Status   MRSA by PCR NEGATIVE NEGATIVE Final    Comment:        The GeneXpert MRSA Assay (FDA approved for NASAL specimens only), is one component of a comprehensive MRSA  colonization surveillance program. It is not intended to diagnose MRSA infection nor to guide or monitor treatment for MRSA infections. Performed at [facility], [address] [place], [place] [address]     Coagulation Studies: No results for input(s): LABPROT, INR in the last 72 hours.  Urinalysis: Recent Labs    08/20/18 0230  COLORURINE YELLOW*  LABSPEC 1.010  PHURINE 9.0*  GLUCOSEU NEGATIVE  HGBUR NEGATIVE  BILIRUBINUR NEGATIVE  KETONESUR NEGATIVE  PROTEINUR 100*  NITRITE POSITIVE*  LEUKOCYTESUR LARGE*      Imaging: Dg Chest 2 View  Result Date: 08/19/2018 CLINICAL DATA:  Shortness of breath and chest pain, initial encounter EXAM: CHEST - 2 VIEW COMPARISON:  08/14/2018 FINDINGS: Cardiac shadow is mildly in size but stable from the previous exam. Mild bibasilar atelectasis/infiltrate is noted. No sizable effusion is seen. No bony abnormality is noted. IMPRESSION: Bibasilar opacities  consistent with early infiltrate or atelectasis. Electronically Signed   By: Inez Catalina M.D.   On: 08/19/2018 23:21     Medications:    . albuterol  2.5 mg Nebulization Q6H  . amLODipine  10 mg Oral Daily  . aspirin EC  81 mg Oral Daily  . atorvastatin  40 mg Oral QHS  . clopidogrel  75 mg Oral Daily  . docusate sodium  100 mg Oral BID  . heparin  5,000 Units Subcutaneous Q8H  . hydrALAZINE  100 mg Oral TID  . isosorbide mononitrate  60 mg Oral Daily  . labetalol  300 mg Oral TID  . oxyCODONE  20 mg Oral Q12H  . senna-docusate  1 tablet Oral BID  . sevelamer carbonate  1,600 mg Oral TID WC  . sucralfate  1 g Oral QID  . tamsulosin  0.4 mg Oral Daily  . terazosin  5 mg Oral QHS   ondansetron **OR** ondansetron (ZOFRAN) IV, oxyCODONE, polyethylene glycol, traZODone  Assessment/ Plan:  Mr. RIO KIDANE is a 63 y.o. black male with end stage renal disease, depression, hypertension, pulmonary embolism  1. End-stage renal disease: Patient gets outpatient dialysis in [facility]. [facility] unit.  Transient dialysis orders for TTS for [place] for tomorrow.   2. Hypertension: blood pressure at goal. 147/89 - Continue amlodipine, hydralazine, labetalol, and terazosin  3.  Anemia chronic kidney disease.  Hemoglobin 9.2.  With thrombocytopenia 109.  - EPO with HD treatment  4.  Secondary hyperparathyroidism.   - Renvela   LOS: 0 Jakya Dovidio 2/10/20201:46 PM

## 2018-08-20 NOTE — ED Notes (Signed)
Pt requesting water to drink; will check with MD; pt asking why he doesn't feel the pain medication working yet since I pushed the Dilaudid through his vein; explained to pt the need to give the medicine some time to work; pt says if the medicine isn't working yet he must be really sick; pt says he will get back on a "routine schedule" for his dialysis

## 2018-08-20 NOTE — ED Notes (Signed)
Pt placed on 1L oxygen via [place] per MD for c/o shortness of breath

## 2018-08-20 NOTE — ED Notes (Signed)
ED TO INPATIENT HANDOFF REPORT  ED Nurse Name and Phone #: Roxan Hockey  Name/Age/Gender [name] 63 y.o. male Room/Bed: ED19A/ED19A  Code Status   Code Status: Prior  Home/SNF/Other Home Patient oriented x 4 Is this baseline? yes  Triage Complete: Triage complete  Chief Complaint Sob/chest pain  Triage Note Pt states has been shob with chest pain today. Pt is panting in triage, crackles noted in lll. Pt gets dialysis on TThSat. Pt denies fever, does have dry cough. When not panting can speak in clear full sentences.    Allergies Allergies  Allergen Reactions  . Cyclobenzaprine Nausea Only  . Clonidine Other (See Comments)    Asymptomatic bradycardia to 38  . Furosemide Other (See Comments)    Caused renal failure  . Tylenol [Acetaminophen] Other (See Comments)    Reaction:  Pt states it bothers his liver    Level of Care/Admitting Diagnosis ED Disposition    ED Disposition Condition [facility] Area: [place] [[id number]]  Level of Care: Telemetry [5]  Diagnosis: Chest pain [096283]  Admitting Physician: Harrie Foreman [[id number]]  Attending Physician: Harrie Foreman [[id number]]  PT Class (Do Not Modify): Observation [104]  PT Acc Code (Do Not Modify): Observation [10022]       Medical/Surgery History Past Medical History:  Diagnosis Date  . Depression   . Heart attack ([place])   . Hypertension   . PE (pulmonary embolism)   . Renal disorder    Past Surgical History:  Procedure Laterality Date  . TOTAL HIP ARTHROPLASTY       IV Location/Drains/Wounds Patient Lines/Drains/Airways Status   Active Line/Drains/Airways    Name:   Placement date:   Placement time:   Site:   Days:   Peripheral IV 08/19/18 Right Hand   08/19/18    2346    Hand   1   Fistula / Graft Left Upper arm Arteriovenous fistula   -    -    Upper arm             Intake/Output Last 24 hours No intake or output data in the 24 hours  ending 08/20/18 0324  Labs/Imaging Results for orders placed or performed during the hospital encounter of 08/19/18 (from the past 48 hour(s))  Troponin I - ONCE - STAT     Status: Abnormal   Collection Time: 08/19/18 10:36 PM  Result Value Ref Range   Troponin I 0.07 (HH) <0.03 ng/mL    Comment: CRITICAL RESULT CALLED TO, READ BACK BY AND VERIFIED WITH LORI ALLEN AT 2316 08/19/2018.  TFK Performed at [facility], [address] [place], [place] [address]   Comprehensive metabolic panel     Status: Abnormal   Collection Time: 08/19/18 10:36 PM  Result Value Ref Range   Sodium 140 135 - 145 mmol/L   Potassium 4.2 3.5 - 5.1 mmol/L   Chloride 103 98 - 111 mmol/L   CO2 28 22 - 32 mmol/L   Glucose, Bld 113 (H) 70 - 99 mg/dL   BUN 52 (H) 6 - 20 mg/dL   Creatinine, Ser 8.51 (H) 0.61 - 1.24 mg/dL   Calcium 8.3 (L) 8.9 - 10.3 mg/dL   Total Protein 6.6 6.5 - 8.1 g/dL   Albumin 3.4 (L) 3.5 - 5.0 g/dL   AST 27 15 - 41 U/L   ALT 32 0 - 44 U/L   Alkaline Phosphatase 47 38 - 126 U/L  Total Bilirubin 0.6 0.3 - 1.2 mg/dL   GFR calc non Af Amer 6 (L) >60 mL/min   GFR calc Af Amer 7 (L) >60 mL/min   Anion gap 9 5 - 15    Comment: Performed at [facility], [address] [place], [place] [address]  CBC     Status: Abnormal   Collection Time: 08/19/18 11:31 PM  Result Value Ref Range   WBC 5.8 4.0 - 10.5 K/uL   RBC 3.18 (L) 4.22 - 5.81 MIL/uL   Hemoglobin 9.2 (L) 13.0 - 17.0 g/dL   HCT 28.5 (L) 39.0 - 52.0 %   MCV 89.6 80.0 - 100.0 fL   MCH 28.9 26.0 - 34.0 pg   MCHC 32.3 30.0 - 36.0 g/dL   RDW 15.5 11.5 - 15.5 %   Platelets 109 (L) 150 - 400 K/uL    Comment: Immature Platelet Fraction may be clinically indicated, consider ordering this additional test [id number]    nRBC 0.0 0.0 - 0.2 %    Comment: Performed at [facility], [address], [place], [place] [address]  Influenza panel by PCR (type A & B)     Status: None   Collection Time: 08/20/18  12:03 AM  Result Value Ref Range   Influenza A By PCR NEGATIVE NEGATIVE   Influenza B By PCR NEGATIVE NEGATIVE    Comment: (NOTE) The Xpert Xpress Flu assay is intended as an aid in the diagnosis of  influenza and should not be used as a sole basis for treatment.  This  assay is FDA approved for nasopharyngeal swab specimens only. Nasal  washings and aspirates are unacceptable for Xpert Xpress Flu testing. Performed at [facility], [address] [place], [place] [address]   Urinalysis, Complete w Microscopic     Status: Abnormal   Collection Time: 08/20/18  2:30 AM  Result Value Ref Range   Color, Urine YELLOW (A) YELLOW   APPearance CLOUDY (A) CLEAR   Specific Gravity, Urine 1.010 1.005 - 1.030   pH 9.0 (H) 5.0 - 8.0   Glucose, UA NEGATIVE NEGATIVE mg/dL   Hgb urine dipstick NEGATIVE NEGATIVE   Bilirubin Urine NEGATIVE NEGATIVE   Ketones, ur NEGATIVE NEGATIVE mg/dL   Protein, ur 100 (A) NEGATIVE mg/dL   Nitrite POSITIVE (A) NEGATIVE   Leukocytes, UA LARGE (A) NEGATIVE   RBC / HPF 6-10 0 - 5 RBC/hpf   WBC, UA >50 (H) 0 - 5 WBC/hpf   Bacteria, UA RARE (A) NONE SEEN   Squamous Epithelial / LPF 0-5 0 - 5   Triple Phosphate Crystal PRESENT     Comment: Performed at [facility], [address] [place], [place] [address]  Urine Drug Screen, Qualitative     Status: Abnormal   Collection Time: 08/20/18  2:30 AM  Result Value Ref Range   Tricyclic, Ur Screen NONE DETECTED NONE DETECTED   Amphetamines, Ur Screen NONE DETECTED NONE DETECTED   MDMA (Ecstasy)Ur Screen NONE DETECTED NONE DETECTED   Cocaine Metabolite,Ur [place] POSITIVE (A) NONE DETECTED   Opiate, Ur Screen POSITIVE (A) NONE DETECTED   Phencyclidine (PCP) Ur S NONE DETECTED NONE DETECTED   Cannabinoid 50 Ng, Ur [place] NONE DETECTED NONE DETECTED   Barbiturates, Ur Screen NONE DETECTED NONE DETECTED   Benzodiazepine, Ur Scrn NONE DETECTED NONE DETECTED   Methadone Scn, Ur NONE DETECTED NONE DETECTED     Comment: (NOTE) Tricyclics + metabolites, urine    Cutoff 1000 ng/mL Amphetamines +  metabolites, urine  Cutoff 1000 ng/mL MDMA (Ecstasy), urine              Cutoff 500 ng/mL Cocaine Metabolite, urine          Cutoff 300 ng/mL Opiate + metabolites, urine        Cutoff 300 ng/mL Phencyclidine (PCP), urine         Cutoff 25 ng/mL Cannabinoid, urine                 Cutoff 50 ng/mL Barbiturates + metabolites, urine  Cutoff 200 ng/mL Benzodiazepine, urine              Cutoff 200 ng/mL Methadone, urine                   Cutoff 300 ng/mL The urine drug screen provides only a preliminary, unconfirmed analytical test result and should not be used for non-medical purposes. Clinical consideration and professional judgment should be applied to any positive drug screen result due to possible interfering substances. A more specific alternate chemical method must be used in order to obtain a confirmed analytical result. Gas chromatography / mass spectrometry (GC/MS) is the preferred confirmat ory method. Performed at [facility], [address] [place], [place] [address]    Dg Chest 2 View  Result Date: 08/19/2018 CLINICAL DATA:  Shortness of breath and chest pain, initial encounter EXAM: CHEST - 2 VIEW COMPARISON:  08/14/2018 FINDINGS: Cardiac shadow is mildly in size but stable from the previous exam. Mild bibasilar atelectasis/infiltrate is noted. No sizable effusion is seen. No bony abnormality is noted. IMPRESSION: Bibasilar opacities consistent with early infiltrate or atelectasis. Electronically Signed   By: Inez Catalina M.D.   On: 08/19/2018 23:21    Pending Labs Unresulted Labs (From admission, onward)    Start     Ordered   Signed and Held  Troponin I - Now Then Q6H  Now then every 6 hours,   R     Signed and Held          Vitals/Pain Today's Vitals   08/20/18 0100 08/20/18 0130 08/20/18 0200 08/20/18 0244  BP: (!) 159/92 (!) 161/104 (!) 166/97 (!) 156/105  Pulse: 78   81 82  Resp: (!) 26 (!) 23 (!) 24 (!) 21  Temp:      TempSrc:      SpO2: 94%  99% 99%  Weight:      Height:      PainSc:        Isolation Precautions No active isolations  Medications Medications  HYDROmorphone (DILAUDID) 1 MG/ML injection (  Not Given 08/20/18 0110)  LORazepam (ATIVAN) injection 0.5 mg (has no administration in time range)  morphine 4 MG/ML injection 4 mg (4 mg Intravenous Given 08/19/18 2354)  albuterol (PROVENTIL) (2.5 MG/3ML) 0.083% nebulizer solution 2.5 mg (2.5 mg Nebulization Given 08/19/18 2356)  albuterol (PROVENTIL) (2.5 MG/3ML) 0.083% nebulizer solution 2.5 mg (2.5 mg Nebulization Given 08/20/18 0110)  HYDROmorphone (DILAUDID) injection 1 mg (1 mg Intravenous Given 08/20/18 0106)    Mobility Moderate fall risk

## 2018-08-20 NOTE — ED Notes (Signed)
Pt sitting on side of bed, tech at bedside; pt says he thinks he needs to void but isn't sure; says last time he voided he had pain; pt given water to drink as requested and allowed by MD

## 2018-08-20 NOTE — Consult Note (Signed)
[facility] Cardiology  CARDIOLOGY CONSULT NOTE  Patient ID: [name] MRN: [id number] DOB/AGE: 1955/10/12 63 y.o.  Admit date: 08/19/2018 Referring Physician [facility] Primary Physician [place] and Internal Medicine Primary Cardiologist none Reason for Consultation Chest pain  HPI: 63 year old male referred for evaluation of chest pain. The patient has a history of ESRD on dialysis, hypertension, coronary artery disease status post MI without PCI, cocaine use, and history of PE. The patient is a poor historian. The patient was discharged on 2/7 for chest pain and pulmonary congestion due to missing dialysis. He reports that he was sitting at home last night when he developed centralized pressure-like chest pain with associated shortness of breath. The patient's brother brought him to [facility] ER. Admission labs notable for troponin 0.07 and 0.08. Urine drug screen positive for cocaine. He denies any recent cocaine use. He states that his pain has been persistent despite IV dilaudid. ECG revealed sinus rhythm at a rate of 74 bpm with lateral T wave depression, unchanged from previous recent ECG. The patient was placed on supplemental oxygen due to feeling short of breath. Chest xray revealed bibasilar opacities consistent with early infiltrate or atelectasis.  Review of systems complete and found to be negative unless listed above     Past Medical History:  Diagnosis Date  . Depression   . Heart attack ([place])   . Hypertension   . PE (pulmonary embolism)   . Renal disorder     Past Surgical History:  Procedure Laterality Date  . TOTAL HIP ARTHROPLASTY      Medications Prior to Admission  Medication Sig Dispense Refill Last Dose  . amLODipine (NORVASC) 10 MG tablet Take 10 mg by mouth daily.  11 08/19/2018 at Unknown time  . aspirin EC 81 MG tablet Take 81 mg by mouth daily.   08/19/2018 at Unknown time  . atorvastatin (LIPITOR) 40 MG tablet Take 40 mg by mouth at bedtime.   0 Past Week  at Unknown time  . clopidogrel (PLAVIX) 75 MG tablet Take 75 mg by mouth daily.   08/19/2018 at Unknown time  . hydrALAZINE (APRESOLINE) 100 MG tablet Take 1 tablet (100 mg total) by mouth 3 (three) times daily. 90 tablet 2 08/19/2018 at Unknown time  . isosorbide mononitrate (IMDUR) 60 MG 24 hr tablet Take 1 tablet (60 mg total) by mouth daily. 30 tablet 2 08/19/2018 at Unknown time  . labetalol (NORMODYNE) 300 MG tablet Take 1 tablet (300 mg total) by mouth 3 (three) times daily. 90 tablet 2 08/19/2018 at 1300  . oxyCODONE (OXYCONTIN) 20 mg 12 hr tablet Take 20 mg by mouth every 12 (twelve) hours.   08/19/2018 at 0800  . Oxycodone HCl 10 MG TABS Take 10-20 mg by mouth every 4 (four) hours as needed (for pain).    prn at prn  . polyethylene glycol (MIRALAX / GLYCOLAX) packet Take 17 g by mouth daily as needed for mild constipation.   0 prn at prn  . senna-docusate (SENOKOT-S) 8.6-50 MG tablet Take 1 tablet by mouth 2 (two) times daily. 30 tablet 2 08/19/2018 at Unknown time  . sevelamer carbonate (RENVELA) 800 MG tablet Take 2 tablets (1,600 mg total) by mouth 3 (three) times daily with meals. 180 tablet 0 08/19/2018 at Unknown time  . sucralfate (CARAFATE) 1 g tablet Take 1 tablet (1 g total) by mouth 4 (four) times daily. 120 tablet 1 08/19/2018 at Unknown time  . tamsulosin (FLOMAX) 0.4 MG CAPS capsule Take 1  capsule (0.4 mg total) by mouth daily. 30 capsule 5 08/19/2018 at Unknown time  . terazosin (HYTRIN) 5 MG capsule Take 1 capsule (5 mg total) by mouth at bedtime. 30 capsule 2 Past Week at Unknown time  . traZODone (DESYREL) 100 MG tablet Take 100 mg by mouth at bedtime as needed for sleep.    prn at prn  . sevelamer carbonate (RENVELA) 800 MG tablet Take 3 tablets (2,400 mg total) by mouth 3 (three) times daily with meals. (Patient not taking: Reported on 08/20/2018) 270 tablet 0 Not Taking at Unknown time   Social History   Socioeconomic History  . Marital status: Divorced    Spouse name: Not on file  .  Number of children: Not on file  . Years of education: Not on file  . Highest education level: Not on file  Occupational History  . Not on file  Social Needs  . Financial resource strain: Not on file  . Food insecurity:    Worry: Not on file    Inability: Not on file  . Transportation needs:    Medical: Not on file    Non-medical: Not on file  Tobacco Use  . Smoking status: Current Every Day Smoker    Packs/day: 0.50    Types: Cigarettes  . Smokeless tobacco: Never Used  Substance and Sexual Activity  . Alcohol use: No  . Drug use: No  . Sexual activity: Not on file  Lifestyle  . Physical activity:    Days per week: Not on file    Minutes per session: Not on file  . Stress: Not on file  Relationships  . Social connections:    Talks on phone: Not on file    Gets together: Not on file    Attends religious service: Not on file    Active member of club or organization: Not on file    Attends meetings of clubs or organizations: Not on file    Relationship status: Not on file  . Intimate partner violence:    Fear of current or ex partner: Not on file    Emotionally abused: Not on file    Physically abused: Not on file    Forced sexual activity: Not on file  Other Topics Concern  . Not on file  Social History Narrative  . Not on file    No family history on file.    Review of systems complete and found to be negative unless listed above      PHYSICAL EXAM  General: Well developed, well nourished, initially resting comfortably watching TV in no acute distress, then upon initiation of interview, he began grimacing with pain  HEENT:  Normocephalic and atramatic Neck:  No JVD.  Lungs: Tachypnea on supplemental oxygen, wheezing, no crackles Heart: HRRR . Normal S1 and S2 without gallops or murmurs.  Abdomen: nondistended Msk:  Back normal, gait not assessed. No obvious deformity. Extremities: 1+ bilateral lower extremity edema Neuro: Alert and oriented X 3. Psych:   Good affect, responds appropriately  Labs:   Lab Results  Component Value Date   WBC 5.8 08/19/2018   HGB 9.2 (L) 08/19/2018   HCT 28.5 (L) 08/19/2018   MCV 89.6 08/19/2018   PLT 109 (L) 08/19/2018    Recent Labs  Lab 08/19/18 2236  NA 140  K 4.2  CL 103  CO2 28  BUN 52*  CREATININE 8.51*  CALCIUM 8.3*  PROT 6.6  BILITOT 0.6  ALKPHOS 47  ALT 32  AST 27  GLUCOSE 113*   Lab Results  Component Value Date   CKTOTAL 167 01/01/2014   CKMB 5.9 (H) 10/20/2014   TROPONINI 0.08 (HH) 08/20/2018    Lab Results  Component Value Date   CHOL 148 09/23/2016   CHOL 134 01/18/2016   CHOL 158 10/20/2014   Lab Results  Component Value Date   HDL 35 (L) 09/23/2016   HDL 39 (L) 01/18/2016   HDL 27 (L) 10/20/2014   Lab Results  Component Value Date   LDLCALC 91 09/23/2016   LDLCALC 80 01/18/2016   LDLCALC 110 (H) 10/20/2014   Lab Results  Component Value Date   TRIG 112 09/23/2016   TRIG 74 01/18/2016   TRIG 104 10/20/2014   Lab Results  Component Value Date   CHOLHDL 4.2 09/23/2016   CHOLHDL 3.4 01/18/2016   CHOLHDL 4.4 10/29/2010   No results found for: LDLDIRECT    Radiology: Dg Chest 1 View  Result Date: 08/14/2018 CLINICAL DATA:  Shortness of breath. Patient has not had dialysis in 2 weeks and is not compliant with medication. EXAM: CHEST  1 VIEW COMPARISON:  10/05/2016 FINDINGS: Shallow inspiration. Cardiac enlargement. No significant vascular congestion or edema. Suggestion of a small cavitary lesion in the right mid lung measuring about 1.6 cm diameter. This was not present on previous study. Consider CT for further evaluation. Lungs are otherwise clear. No focal consolidation. No blunting of costophrenic angles. No pneumothorax. Mediastinal contours appear intact. IMPRESSION: Cardiac enlargement. Suggestion of cavitary lesion in the right mid lung. Consider CT for further evaluation. Electronically Signed   By: Lucienne Capers M.D.   On: 08/14/2018 21:55   Dg  Chest 2 View  Result Date: 08/19/2018 CLINICAL DATA:  Shortness of breath and chest pain, initial encounter EXAM: CHEST - 2 VIEW COMPARISON:  08/14/2018 FINDINGS: Cardiac shadow is mildly in size but stable from the previous exam. Mild bibasilar atelectasis/infiltrate is noted. No sizable effusion is seen. No bony abnormality is noted. IMPRESSION: Bibasilar opacities consistent with early infiltrate or atelectasis. Electronically Signed   By: Inez Catalina M.D.   On: 08/19/2018 23:21   Ct Chest Wo Contrast  Result Date: 08/14/2018 CLINICAL DATA:  63 year old male with chest pain. Possible mid right lung nodule or cavitary lesion on portable chest today. Has not had dialysis in 2 weeks. EXAM: CT CHEST WITHOUT CONTRAST TECHNIQUE: Multidetector CT imaging of the chest was performed following the standard protocol without IV contrast. COMPARISON:  Portable chest 2135 hours. CT Abdomen and Pelvis 06/23/2016. FINDINGS: Cardiovascular: Cardiomegaly has increased since 2017. No pericardial effusion. Calcified coronary artery atherosclerosis (series 2, image 72). Vascular patency is not evaluated in the absence of IV contrast. Mediastinum/Nodes: Negative.  No mediastinal lymphadenopathy. Lungs/Pleura: Mild elevation of the right hemidiaphragm is chronic. There is no right lung nodule or mass. No right pleural effusion. Mild upper lobe centrilobular emphysema. Chronic mild paraseptal emphysema in the left costophrenic angle is stable since 2017. Mild left lung atelectasis. No left pleural effusion. Major airways are patent. Upper Abdomen: Mild respiratory motion in the upper abdomen. Chronic hypertrophy of the left hepatic lobe. The gallbladder is more contracted today. Stable and negative visible noncontrast spleen, left adrenal gland, bowel in the upper abdomen. Musculoskeletal: No right rib lesion to explain the earlier portable chest density. No acute or suspicious osseous abnormality. IMPRESSION: 1. No right lung  lesion, cavitary or otherwise. Suspect artifact on the portable chest x-ray earlier today. 2. There is mild Emphysema, but  no acute pulmonary process. 3. Cardiomegaly which has progressed since 2017. No pericardial effusion. 4. Chronic hypertrophy of the left hepatic lobe. Consider the possibility of Cirrhosis. Electronically Signed   By: Genevie Ann M.D.   On: 08/14/2018 22:48    EKG: sinus rhythm  ASSESSMENT AND PLAN:  1. Atypical chest pain, with borderline elevated troponin, with recent cocaine use. The patient was discharged on 2/7 for chest pain and pulmonary congestion with several missed dialysis treatments. 2. History of pulmonary embolism. Chest CT on 08/14/18 negative for PE 3. Hypertension, elevated this morning 4. ESRD on dialysis, history of noncompliance; last treatment was Friday  Recommendations: 1. Strongly encouraged patient to avoid illicit drug use 2. Strongly encouraged patient to be compliant with dialysis treatments as outpatient 3. Nephrology consulted 4. 2D echocardiogram 5. Defer nuclear stress test or cardiac catheterization at this time  Signed: Clabe Seal PA-C 08/20/2018, 8:12 AM

## 2018-08-20 NOTE — ED Notes (Addendum)
ED tech Sarah in to assist pt with urinal; specimen not sent, she was unaware of urine orders being placed by MD

## 2018-08-21 LAB — ECHOCARDIOGRAM COMPLETE
Height: 72 in
Weight: 4480 oz

## 2018-08-21 MED ORDER — EPOETIN ALFA 10000 UNIT/ML IJ SOLN
10000.0000 [IU] | INTRAMUSCULAR | Status: DC
  Administered 2018-08-21: 10000 [IU] via INTRAVENOUS

## 2018-08-21 NOTE — Discharge Summary (Signed)
[facility] NAME: [name]    MR#:  [id number]  DATE OF BIRTH:  [birth year]  DATE OF ADMISSION:  08/19/2018 ADMITTING PHYSICIAN: Harrie Foreman, MD  DATE OF DISCHARGE: 08/21/2018  PRIMARY CARE PHYSICIAN: Patient, No Pcp Per    ADMISSION DIAGNOSIS:  Chest pain, unspecified type [R07.9]  DISCHARGE DIAGNOSIS:  Active Problems:   Chest pain   SECONDARY DIAGNOSIS:   Past Medical History:  Diagnosis Date  . Depression   . Heart attack ([place])   . Hypertension   . PE (pulmonary embolism)   . Renal disorder     HOSPITAL COURSE:   63 year old male with past medical history of end-stage renal disease on hemodialysis, hypertension, history of pulmonary embolism, polysubstance abuse, coronary artery disease who was recently discharged from the hospital returns back due to chest pain.  1.  Chest pain- suspected to be secondary to noncompliance and also secondary to cocaine abuse. - Patient's chest pain was very atypical, his markers were only mildly elevated, EKG showed no acute ST or T wave changes. - Seen by cardiology and no plans for further intervention.  Echocardiogram showed normal EF with no wall motion abnormalities.  - pt. Will Continue ASA, Plavix, Atorvastatin, Imdur - stable to be discharged home.   2.  End-stage renal disease on hemodialysis-nephrology has been consulted.  Continue dialysis on a Tuesday Thursday Saturday schedule.  Patient is noncompliant.   - will have HD today prior to discharge.   3.  Essential hypertension- pt. Will continue Norvasc, Imdur, hydralazine, labetalol.  4.  Chronic pain- pt. Will continue patient's oxycodone and OxyContin.  5.  BPH- pt. Will continue Flomax, Terazosin  6.  Secondary hyperparathyroidism- pt. Will continue Renvela.  Stable for d/c after HD today.   DISCHARGE CONDITIONS:   Stable.   CONSULTS OBTAINED:  Treatment Team:  Anthonette Legato, MD Isaias Cowman, MD  DRUG ALLERGIES:   Allergies  Allergen Reactions  . Cyclobenzaprine Nausea Only  . Clonidine Other (See Comments)    Asymptomatic bradycardia to 38  . Furosemide Other (See Comments)    Caused renal failure  . Tylenol [Acetaminophen] Other (See Comments)    Reaction:  Pt states it bothers his liver    DISCHARGE MEDICATIONS:   Allergies as of 08/21/2018      Reactions   Cyclobenzaprine Nausea Only   Clonidine Other (See Comments)   Asymptomatic bradycardia to 38   Furosemide Other (See Comments)   Caused renal failure   Tylenol [acetaminophen] Other (See Comments)   Reaction:  Pt states it bothers his liver      Medication List    TAKE these medications   amLODipine 10 MG tablet Commonly known as:  NORVASC Take 10 mg by mouth daily.   aspirin EC 81 MG tablet Take 81 mg by mouth daily.   atorvastatin 40 MG tablet Commonly known as:  LIPITOR Take 40 mg by mouth at bedtime.   clopidogrel 75 MG tablet Commonly known as:  PLAVIX Take 75 mg by mouth daily.   hydrALAZINE 100 MG tablet Commonly known as:  APRESOLINE Take 1 tablet (100 mg total) by mouth 3 (three) times daily.   isosorbide mononitrate 60 MG 24 hr tablet Commonly known as:  IMDUR Take 1 tablet (60 mg total) by mouth daily.   labetalol 300 MG tablet Commonly known as:  NORMODYNE Take 1 tablet (300 mg total) by mouth 3 (three) times daily.  oxyCODONE 20 mg 12 hr tablet Commonly known as:  OXYCONTIN Take 20 mg by mouth every 12 (twelve) hours.   Oxycodone HCl 10 MG Tabs Take 10-20 mg by mouth every 4 (four) hours as needed (for pain).   polyethylene glycol packet Commonly known as:  MIRALAX / GLYCOLAX Take 17 g by mouth daily as needed for mild constipation.   senna-docusate 8.6-50 MG tablet Commonly known as:  Senokot-S Take 1 tablet by mouth 2 (two) times daily.   sevelamer carbonate 800 MG tablet Commonly known as:  RENVELA Take 2 tablets (1,600 mg total) by mouth 3  (three) times daily with meals. What changed:  Another medication with the same name was removed. Continue taking this medication, and follow the directions you see here.   sucralfate 1 g tablet Commonly known as:  CARAFATE Take 1 tablet (1 g total) by mouth 4 (four) times daily.   tamsulosin 0.4 MG Caps capsule Commonly known as:  FLOMAX Take 1 capsule (0.4 mg total) by mouth daily.   terazosin 5 MG capsule Commonly known as:  HYTRIN Take 1 capsule (5 mg total) by mouth at bedtime.   traZODone 100 MG tablet Commonly known as:  DESYREL Take 100 mg by mouth at bedtime as needed for sleep.         DISCHARGE INSTRUCTIONS:   DIET:  Cardiac diet and Renal diet  DISCHARGE CONDITION:  Stable  ACTIVITY:  Activity as tolerated  OXYGEN:  Home Oxygen: No.   Oxygen Delivery: room air  DISCHARGE LOCATION:  home   If you experience worsening of your admission symptoms, develop shortness of breath, life threatening emergency, suicidal or homicidal thoughts you must seek medical attention immediately by calling 911 or calling your MD immediately  if symptoms less severe.  You Must read complete instructions/literature along with all the possible adverse reactions/side effects for all the Medicines you take and that have been prescribed to you. Take any new Medicines after you have completely understood and accpet all the possible adverse reactions/side effects.   Please note  You were cared for by a hospitalist during your hospital stay. If you have any questions about your discharge medications or the care you received while you were in the hospital after you are discharged, you can call the unit and asked to speak with the hospitalist on call if the hospitalist that took care of you is not available. Once you are discharged, your primary care physician will handle any further medical issues. Please note that NO REFILLS for any discharge medications will be authorized once you are  discharged, as it is imperative that you return to your primary care physician (or establish a relationship with a primary care physician if you do not have one) for your aftercare needs so that they can reassess your need for medications and monitor your lab values.     Today   Patient had some intermittent nausea overnight and vomited but it was not witnessed.  No acute chest pains, cardiac markers have remained flat.  Echocardiogram showed normal ejection fraction with no acute wall motion abnormality.  Will discharge home after hemodialysis today.  VITAL SIGNS:  Blood pressure (!) 161/100, pulse 73, temperature 97.8 F (36.6 C), temperature source Oral, resp. rate 17, height 6' (1.829 m), weight 128.8 kg, SpO2 100 %.  I/O:    Intake/Output Summary (Last 24 hours) at 08/21/2018 1414 Last data filed at 08/21/2018 1339 Gross per 24 hour  Intake 480 ml  Output  3855 ml  Net -3375 ml    PHYSICAL EXAMINATION:   GENERAL:  63 y.o.-year-old patient lying in bed in no acute distress.  EYES: Pupils equal, round, reactive to light and accommodation. No scleral icterus. Extraocular muscles intact.  HEENT: Head atraumatic, normocephalic. Oropharynx and nasopharynx clear.  NECK:  Supple, no jugular venous distention. No thyroid enlargement, no tenderness.  LUNGS: Normal breath sounds bilaterally, no wheezing, rales, rhonchi. No use of accessory muscles of respiration.  CARDIOVASCULAR: S1, S2 normal. No murmurs, rubs, or gallops.  ABDOMEN: Soft, nontender, nondistended. Bowel sounds present. No organomegaly or mass.  EXTREMITIES: No cyanosis, clubbing or edema b/l.    NEUROLOGIC: Cranial nerves II through XII are intact. No focal Motor or sensory deficits b/l.   PSYCHIATRIC: The patient is alert and oriented x 3.  SKIN: No obvious rash, lesion, or ulcer.   Left upper ext. AV fistula with good bruit/thrill.   DATA REVIEW:   CBC Recent Labs  Lab 08/19/18 2331  WBC 5.8  HGB 9.2*  HCT  28.5*  PLT 109*    Chemistries  Recent Labs  Lab 08/19/18 2236  NA 140  K 4.2  CL 103  CO2 28  GLUCOSE 113*  BUN 52*  CREATININE 8.51*  CALCIUM 8.3*  AST 27  ALT 32  ALKPHOS 47  BILITOT 0.6    Cardiac Enzymes Recent Labs  Lab 08/20/18 1552  TROPONINI 0.07*    Microbiology Results  Results for orders placed or performed during the hospital encounter of 08/14/18  MRSA PCR Screening     Status: None   Collection Time: 08/15/18  1:12 AM  Result Value Ref Range Status   MRSA by PCR NEGATIVE NEGATIVE Final    Comment:        The GeneXpert MRSA Assay (FDA approved for NASAL specimens only), is one component of a comprehensive MRSA colonization surveillance program. It is not intended to diagnose MRSA infection nor to guide or monitor treatment for MRSA infections. Performed at [facility], [address] [place], [place] [address]     RADIOLOGY:  Dg Chest 2 View  Result Date: 08/19/2018 CLINICAL DATA:  Shortness of breath and chest pain, initial encounter EXAM: CHEST - 2 VIEW COMPARISON:  08/14/2018 FINDINGS: Cardiac shadow is mildly in size but stable from the previous exam. Mild bibasilar atelectasis/infiltrate is noted. No sizable effusion is seen. No bony abnormality is noted. IMPRESSION: Bibasilar opacities consistent with early infiltrate or atelectasis. Electronically Signed   By: Inez Catalina M.D.   On: 08/19/2018 23:21      Management plans discussed with the patient, family and they are in agreement.  CODE STATUS:     Code Status Orders  (From admission, onward)         Start     Ordered   08/20/18 0400  Full code  Continuous     08/20/18 0400          TOTAL TIME TAKING CARE OF THIS PATIENT: 40 minutes.    Henreitta Leber M.D on 08/21/2018 at 2:14 PM  Between 7am to 6pm - Pager - [phone]  After 6pm go to [email or web address] - Proofreader  [facility]  Office  [phone]  CC: Primary  care physician; Patient, No Pcp Per

## 2018-08-21 NOTE — Progress Notes (Signed)
[facility]  ROUNDING NOTE   Subjective:   Seen and examined on hemodialysis. Tolerating treatment well.     HEMODIALYSIS FLOWSHEET:  Blood Flow Rate (mL/min): 400 mL/min Arterial Pressure (mmHg): -180 mmHg Venous Pressure (mmHg): 170 mmHg Transmembrane Pressure (mmHg): 60 mmHg Ultrafiltration Rate (mL/min): 1000 mL/min Dialysate Flow Rate (mL/min): 600 ml/min Conductivity: Machine : 13.7 Conductivity: Machine : 13.7 Dialysis Fluid Bolus: Normal Saline Bolus Amount (mL): 250 mL    Objective:  Vital signs in last 24 hours:  Temp:  [97.6 F (36.4 C)-98.4 F (36.9 C)] 97.6 F (36.4 C) (02/11 0946) Pulse Rate:  [61-81] 77 (02/11 1300) Resp:  [12-20] 14 (02/11 1300) BP: (119-158)/(78-118) 158/94 (02/11 1300) SpO2:  [97 %-100 %] 98 % (02/11 1300) Weight:  [131.8 kg] 131.8 kg (02/11 0946)  Weight change:  Filed Weights   08/19/18 2226 08/21/18 0946  Weight: 127 kg 131.8 kg    Intake/Output: I/O last 3 completed shifts: In: 35 [P.O.:960] Out: 1201 [Urine:1200; Emesis/NG output:1]   Intake/Output this shift:  Total I/O In: 240 [P.O.:240] Out: -   Physical Exam: General: No acute distress  Head: Normocephalic, atraumatic. Moist oral mucosal membranes  Eyes: Anicteric  Neck: Supple, trachea midline  Lungs:  Basilar crackles  Heart: S1S2 no rubs  Abdomen:  Soft, nontender, bowel sounds present  Extremities: 1+ peripheral edema.  Neurologic: Awake, alert, following commands  Skin: No lesions  Access: LUE AV Fistula    Basic Metabolic Panel: Recent Labs  Lab 08/14/18 2115 08/15/18 0249 08/15/18 1117 08/16/18 0325 08/17/18 0549 08/19/18 2236  NA 138 138  --  138 137 140  K 4.3 4.0  --  4.0 4.0 4.2  CL 100 101  --  100 100 103  CO2 25 25  --  28 26 28   GLUCOSE 99 117*  --  106* 97 113*  BUN 76* 77*  --  60* 65* 52*  CREATININE 11.30* 11.30*  --  8.41* 9.35* 8.51*  CALCIUM 7.6* 7.3*  --  7.3* 7.5* 8.3*  PHOS  --   --  8.0* 5.6* 5.9*   5.8*  --     Liver Function Tests: Recent Labs  Lab 08/16/18 0325 08/17/18 0549 08/19/18 2236  AST  --   --  27  ALT  --   --  32  ALKPHOS  --   --  47  BILITOT  --   --  0.6  PROT  --   --  6.6  ALBUMIN 3.2* 3.1* 3.4*   No results for input(s): LIPASE, AMYLASE in the last 168 hours. No results for input(s): AMMONIA in the last 168 hours.  CBC: Recent Labs  Lab 08/14/18 2115 08/15/18 0249 08/19/18 2331  WBC 6.2 5.8 5.8  NEUTROABS 4.0  --   --   HGB 9.8* 9.2* 9.2*  HCT 29.3* 27.9* 28.5*  MCV 85.9 85.8 89.6  PLT 120* 107* 109*    Cardiac Enzymes: Recent Labs  Lab 08/15/18 0858 08/19/18 2236 08/20/18 0412 08/20/18 0945 08/20/18 1552  TROPONINI 0.11* 0.07* 0.08* 0.06* 0.07*    BNP: Invalid input(s): POCBNP  CBG: Recent Labs  Lab 08/14/18 2105  GLUCAP 104*    Microbiology: Results for orders placed or performed during the hospital encounter of 08/14/18  MRSA PCR Screening     Status: None   Collection Time: 08/15/18  1:12 AM  Result Value Ref Range Status   MRSA by PCR NEGATIVE NEGATIVE Final    Comment:  The GeneXpert MRSA Assay (FDA approved for NASAL specimens only), is one component of a comprehensive MRSA colonization surveillance program. It is not intended to diagnose MRSA infection nor to guide or monitor treatment for MRSA infections. Performed at [facility], [address] [place], [place] [address]     Coagulation Studies: No results for input(s): LABPROT, INR in the last 72 hours.  Urinalysis: Recent Labs    08/20/18 0230  COLORURINE YELLOW*  LABSPEC 1.010  PHURINE 9.0*  GLUCOSEU NEGATIVE  HGBUR NEGATIVE  BILIRUBINUR NEGATIVE  KETONESUR NEGATIVE  PROTEINUR 100*  NITRITE POSITIVE*  LEUKOCYTESUR LARGE*      Imaging: Dg Chest 2 View  Result Date: 08/19/2018 CLINICAL DATA:  Shortness of breath and chest pain, initial encounter EXAM: CHEST - 2 VIEW COMPARISON:  08/14/2018 FINDINGS: Cardiac shadow is  mildly in size but stable from the previous exam. Mild bibasilar atelectasis/infiltrate is noted. No sizable effusion is seen. No bony abnormality is noted. IMPRESSION: Bibasilar opacities consistent with early infiltrate or atelectasis. Electronically Signed   By: Inez Catalina M.D.   On: 08/19/2018 23:21     Medications:    . albuterol  2.5 mg Nebulization Q6H  . amLODipine  10 mg Oral Daily  . aspirin EC  81 mg Oral Daily  . atorvastatin  40 mg Oral QHS  . clopidogrel  75 mg Oral Daily  . docusate sodium  100 mg Oral BID  . epoetin (EPOGEN/PROCRIT) injection  10,000 Units Intravenous Q T,Th,Sa-HD  . heparin  5,000 Units Subcutaneous Q8H  . hydrALAZINE  100 mg Oral TID  . isosorbide mononitrate  60 mg Oral Daily  . labetalol  300 mg Oral TID  . oxyCODONE  20 mg Oral Q12H  . senna-docusate  1 tablet Oral BID  . sevelamer carbonate  1,600 mg Oral TID WC  . sucralfate  1 g Oral QID  . tamsulosin  0.4 mg Oral Daily  . terazosin  5 mg Oral QHS   ondansetron **OR** ondansetron (ZOFRAN) IV, oxyCODONE, polyethylene glycol, traZODone  Assessment/ Plan:  [name] is a 63 y.o. black male with end stage renal disease, depression, hypertension, pulmonary embolism  1. End-stage renal disease: Patient gets outpatient dialysis in [facility].    Transient dialysis orders for TTS for [place] for tomorrow.   2. Hypertension: blood pressure at goal.   - Continue amlodipine, hydralazine, labetalol, and terazosin  3.  Anemia chronic kidney disease.  With thrombocytopenia   - EPO with HD treatment  4.  Secondary hyperparathyroidism.   - Renvela   LOS: 0 [name] 2/11/20201:04 PM

## 2018-08-21 NOTE — Plan of Care (Signed)
  Problem: Health Behavior/Discharge Planning: Goal: Ability to manage health-related needs will improve Outcome: Progressing Note:  Patient for HD today. Remains in that department at this time. Will continue to monitor. To d/c home afterward. [facility]

## 2018-08-21 NOTE — Discharge Instructions (Signed)
Shortness of Breath, Adult  Shortness of breath is when a person has trouble breathing enough air or when a person feels like she or he is having trouble breathing in enough air. Shortness of breath could be a sign of a medical problem.  Follow these instructions at home:     Pay attention to any changes in your symptoms.   Do not use any products that contain nicotine or tobacco, such as cigarettes, e-cigarettes, and chewing tobacco.   Do not smoke. Smoking is a common cause of shortness of breath. If you need help quitting, ask your health care provider.   Avoid things that can irritate your airways, such as:  ? Mold.  ? Dust.  ? Air pollution.  ? Chemical fumes.  ? Things that can cause allergy symptoms (allergens), if you have allergies.   Keep your living space clean and free of mold and dust.   Rest as needed. Slowly return to your usual activities.   Take over-the-counter and prescription medicines only as told by your health care provider. This includes oxygen therapy and inhaled medicines.   Keep all follow-up visits as told by your health care provider. This is important.  Contact a health care provider if:   Your condition does not improve as soon as expected.   You have a hard time doing your normal activities, even after you rest.   You have new symptoms.  Get help right away if:   Your shortness of breath gets worse.   You have shortness of breath when you are resting.   You feel light-headed or you faint.   You have a cough that is not controlled with medicines.   You cough up blood.   You have pain with breathing.   You have pain in your chest, arms, shoulders, or abdomen.   You have a fever.   You cannot walk up stairs or exercise the way that you normally do.  These symptoms may represent a serious problem that is an emergency. Do not wait to see if the symptoms will go away. Get medical help right away. Call your local emergency services (911 in the [place] Do not drive yourself  to the hospital.  Summary   Shortness of breath is when a person has trouble breathing enough air. It can be a sign of a medical problem.   Avoid things that irritate your lungs, such as smoking, pollution, mold, and dust.   Pay attention to changes in your symptoms and contact your health care provider if you have a hard time completing daily activities because of shortness of breath.  This information is not intended to replace advice given to you by your health care provider. Make sure you discuss any questions you have with your health care provider.  Document Released: 03/22/2001 Document Revised: 11/27/2017 Document Reviewed: 11/27/2017  Elsevier Interactive Patient Education  2019 [employer] Inc[employer]

## 2018-08-21 NOTE — Progress Notes (Signed)
[facility]  SUBJECTIVE: The patient reports persistent chest tightness and pressure that has not lessened or worsened in severity since admission. He continues to have some shortness of breath at rest, requiring supplemental oxygen use. He is scheduled for dialysis today.   Vitals:   08/21/18 0138 08/21/18 0532 08/21/18 0710 08/21/18 0732  BP:  127/83  132/85  Pulse:  67  65  Resp:  20  19  Temp:  98 F (36.7 C)  97.7 F (36.5 C)  TempSrc:  Oral  Oral  SpO2: 97% 100% 98% 100%  Weight:      Height:         Intake/Output Summary (Last 24 hours) at 08/21/2018 7262 Last data filed at 08/21/2018 0532 Gross per 24 hour  Intake 960 ml  Output 1201 ml  Net -241 ml      PHYSICAL EXAM  General: Well developed, well nourished, initially resting comfortably watching TV HEENT:  Normocephalic and atramatic Neck:   No JVD.  Lungs: Normal effort of breathing on supplemental oxygen, some wheezing, no crackles Heart: HRRR . Normal S1 and S2 without gallops or murmurs.  Abdomen: nondistended Msk:  Back normal, gait not assessed. No obvious deformity. Extremities: 1+ bilateral lower extremity edema Neuro: Alert and oriented X 3. Psych:  Good affect, responds appropriately  LABS: Basic Metabolic Panel: Recent Labs    08/19/18 2236  NA 140  K 4.2  CL 103  CO2 28  GLUCOSE 113*  BUN 52*  CREATININE 8.51*  CALCIUM 8.3*   Liver Function Tests: Recent Labs    08/19/18 2236  AST 27  ALT 32  ALKPHOS 47  BILITOT 0.6  PROT 6.6  ALBUMIN 3.4*   No results for input(s): LIPASE, AMYLASE in the last 72 hours. CBC: Recent Labs    08/19/18 2331  WBC 5.8  HGB 9.2*  HCT 28.5*  MCV 89.6  PLT 109*   Cardiac Enzymes: Recent Labs    08/20/18 0412 08/20/18 0945 08/20/18 1552  TROPONINI 0.08* 0.06* 0.07*   BNP: Invalid input(s): POCBNP D-Dimer: No results for input(s): DDIMER in the last 72 hours. Hemoglobin A1C: No results for input(s): HGBA1C in the last 72  hours. Fasting Lipid Panel: No results for input(s): CHOL, HDL, LDLCALC, TRIG, CHOLHDL, LDLDIRECT in the last 72 hours. Thyroid Function Tests: No results for input(s): TSH, T4TOTAL, T3FREE, THYROIDAB in the last 72 hours.  Invalid input(s): FREET3 Anemia Panel: No results for input(s): VITAMINB12, FOLATE, FERRITIN, TIBC, IRON, RETICCTPCT in the last 72 hours.  Dg Chest 2 View  Result Date: 08/19/2018 CLINICAL DATA:  Shortness of breath and chest pain, initial encounter EXAM: CHEST - 2 VIEW COMPARISON:  08/14/2018 FINDINGS: Cardiac shadow is mildly in size but stable from the previous exam. Mild bibasilar atelectasis/infiltrate is noted. No sizable effusion is seen. No bony abnormality is noted. IMPRESSION: Bibasilar opacities consistent with early infiltrate or atelectasis. Electronically Signed   By: Inez Catalina M.D.   On: 08/19/2018 23:21     Echo pending  TELEMETRY: sinus rhythm, rate 75 bpm  ASSESSMENT AND PLAN:  Active Problems:   Chest pain    1. Atypical chest pain, with borderline elevated troponin, with recent cocaine use. The patient was discharged on 2/7 for chest pain and pulmonary congestion with several missed dialysis treatments. Troponin 0.07, 0.08, 0.06, and 0.07. 2. History of pulmonary embolism. Chest CT on 08/14/18 negative for PE 3. Hypertension, well controlled  4. ESRD on dialysis, history of noncompliance; due for  dialysis treatment today  Recommendations: 1. Review echocardiogram 2. Strongly encouraged patient to abstain from cocaine, and to be compliant with outpatient dialysis treatments 3. Dialysis today 4. Continue amlodipine, aspirin, Plavix, Imdur, labetalol, hydralazine, and atorvastatin 5. If stable after dialysis, patient may be discharged from cardiovascular perspective, with follow-up as outpatient.   Clabe Seal, PA-C 08/21/2018 9:05 AM

## 2018-08-21 NOTE — Progress Notes (Signed)
Post HD assessment    08/21/18 1338  Neurological  Level of Consciousness Alert  Orientation Level Oriented X4  Respiratory  Respiratory Pattern Regular;Unlabored  Chest Assessment Chest expansion symmetrical  Cardiac  Pulse Regular  ECG Monitor Yes  Cardiac Rhythm NSR  Antiarrhythmic device No  Vascular  R Radial Pulse +2  L Radial Pulse +2  Edema Generalized  Integumentary  Integumentary (WDL) X  Skin Color Appropriate for ethnicity  Musculoskeletal  Musculoskeletal (WDL) X  Generalized Weakness Yes  Assistive Device None  GU Assessment  Genitourinary (WDL) X  Genitourinary Symptoms  (HD)  Psychosocial  Psychosocial (WDL) WDL

## 2018-08-21 NOTE — Progress Notes (Signed)
Pre HD assessment    08/21/18 0946  Vital Signs  Temp 97.6 F (36.4 C)  Temp Source Oral  Pulse Rate 69  Pulse Rate Source Monitor  Resp 17  BP 140/81  BP Location Right Arm  BP Method Automatic  Patient Position (if appropriate) Lying  Oxygen Therapy  SpO2 100 %  O2 Device Nasal Cannula  O2 Flow Rate (L/min) 2 L/min  Pain Assessment  Pain Scale 0-10  Pain Score 0  Dialysis Weight  Weight 131.8 kg  Type of Weight Pre-Dialysis  Time-Out for Hemodialysis  What Procedure? HD  Pt Identifiers(min of two) First/Last Name;MRN/Account#  Correct Site? Yes  Correct Side? Yes  Correct Procedure? Yes  Consents Verified? Yes  Rad Studies Available? N/A  Safety Precautions Reviewed? Yes  Engineer, civil (consulting) Number  ([id number])  Station Number 1  UF/Alarm Test Passed  Conductivity: Meter 13.8  Conductivity: Machine  13.7  pH 7.4  Reverse Osmosis main  Normal Saline Lot Number [id number]  Dialyzer Lot Number [id number]  Disposable Set Lot Number [id number]  Machine Temperature 98.6 F (37 C)  Musician and Audible Yes  Blood Lines Intact and Secured Yes  Pre Treatment Patient Checks  Vascular access used during treatment Fistula  Hepatitis B Surface Antigen Results Negative  Date Hepatitis B Surface Antigen Drawn 08/15/18  Hepatitis B Surface Antibody  (>10)  Date Hepatitis B Surface Antibody Drawn 08/15/18  Hemodialysis Consent Verified Yes  Hemodialysis Standing Orders Initiated Yes  ECG (Telemetry) Monitor On Yes  Prime Ordered Normal Saline  Length of  DialysisTreatment -hour(s) 3.5 Hour(s)  Dialyzer Elisio 17H NR  Dialysate 2K, 2.5 Ca  Dialysis Anticoagulant None  Dialysate Flow Ordered 600  Blood Flow Rate Ordered 400 mL/min  Ultrafiltration Goal 3 Liters  Pre Treatment Labs Renal panel;CBC  Dialysis Blood Pressure Support Ordered Normal Saline  Education / Care Plan  Dialysis Education Provided Yes  Documented Education in Care Plan Yes  Fistula / Graft Left  Upper arm Arteriovenous fistula  No Placement Date or Time found.   Placed prior to admission: Yes  Orientation: Left  Access Location: Upper arm  Access Type: Arteriovenous fistula  Site Condition No complications  Fistula / Graft Assessment Present;Thrill;Bruit  Drainage Description None

## 2018-08-21 NOTE — Progress Notes (Signed)
HD tx start    08/21/18 0954  Vital Signs  Pulse Rate 70  Pulse Rate Source Monitor  Resp 15  BP (!) 141/87  BP Location Right Arm  BP Method Automatic  Patient Position (if appropriate) Lying  Oxygen Therapy  SpO2 100 %  O2 Device Nasal Cannula  O2 Flow Rate (L/min) 2 L/min  During Hemodialysis Assessment  Blood Flow Rate (mL/min) 400 mL/min  Arterial Pressure (mmHg) -150 mmHg  Venous Pressure (mmHg) 170 mmHg  Transmembrane Pressure (mmHg) 60 mmHg  Ultrafiltration Rate (mL/min) 1000 mL/min  Dialysate Flow Rate (mL/min) 600 ml/min  Conductivity: Machine  13.7  HD Safety Checks Performed Yes  Dialysis Fluid Bolus Normal Saline  Bolus Amount (mL) 250 mL  Intra-Hemodialysis Comments Tx initiated  Fistula / Graft Left Upper arm Arteriovenous fistula  No Placement Date or Time found.   Placed prior to admission: Yes  Orientation: Left  Access Location: Upper arm  Access Type: Arteriovenous fistula  Status Accessed  Needle Size 15

## 2018-08-21 NOTE — Progress Notes (Signed)
Pre HD Assessment    08/21/18 0947  Neurological  Level of Consciousness Alert  Orientation Level Oriented X4  Respiratory  Respiratory Pattern Regular;Unlabored;Dyspnea with exertion  Chest Assessment Chest expansion symmetrical  Cardiac  Pulse Regular  ECG Monitor Yes  Cardiac Rhythm NSR  Antiarrhythmic device No  Vascular  R Radial Pulse +2  L Radial Pulse +2  Edema Generalized  Integumentary  Integumentary (WDL) X  Skin Color Appropriate for ethnicity  Musculoskeletal  Musculoskeletal (WDL) X  Generalized Weakness Yes  Assistive Device None  GU Assessment  Genitourinary (WDL) X  Genitourinary Symptoms  (HD)  Psychosocial  Psychosocial (WDL) WDL

## 2018-08-21 NOTE — Progress Notes (Signed)
HD tx end    08/21/18 1333  Vital Signs  Pulse Rate 77  Pulse Rate Source Monitor  Resp 16  BP (!) 156/95  BP Location Right Arm  BP Method Automatic  Patient Position (if appropriate) Lying  Oxygen Therapy  SpO2 100 %  O2 Device Nasal Cannula  O2 Flow Rate (L/min) 2 L/min  During Hemodialysis Assessment  Dialysis Fluid Bolus Normal Saline  Bolus Amount (mL) 250 mL  Intra-Hemodialysis Comments Tx completed

## 2018-08-21 NOTE — Progress Notes (Signed)
Post HD assessment. PT tolerated tx well without c/o or complication. Net UF 3004, goal met.    08/21/18 1339  Vital Signs  Temp 97.8 F (36.6 C)  Temp Source Oral  Pulse Rate 74  Pulse Rate Source Monitor  Resp 16  BP (!) 161/100  BP Location Right Arm  BP Method Automatic  Patient Position (if appropriate) Lying  Oxygen Therapy  SpO2 100 %  O2 Device Nasal Cannula  O2 Flow Rate (L/min) 2 L/min  Dialysis Weight  Weight 128.8 kg  Type of Weight Post-Dialysis  Post-Hemodialysis Assessment  Rinseback Volume (mL) 250 mL  KECN 73.4 V  Dialyzer Clearance Lightly streaked  Duration of HD Treatment -hour(s) 3.5 hour(s)  Hemodialysis Intake (mL) 500 mL  UF Total -Machine (mL) 3504 mL  Net UF (mL) 3004 mL  Tolerated HD Treatment Yes  AVG/AVF Arterial Site Held (minutes) 10 minutes  AVG/AVF Venous Site Held (minutes) 10 minutes  Education / Care Plan  Dialysis Education Provided Yes  Documented Education in Care Plan Yes  Fistula / Graft Left Upper arm Arteriovenous fistula  No Placement Date or Time found.   Placed prior to admission: Yes  Orientation: Left  Access Location: Upper arm  Access Type: Arteriovenous fistula  Site Condition No complications  Fistula / Graft Assessment Present;Thrill;Bruit  Status Deaccessed  Drainage Description None

## 2018-08-25 ENCOUNTER — Observation Stay
Admission: EM | Admit: 2018-08-25 | Discharge: 2018-08-26 | Disposition: A | Discharge status: Home / Self Care | Payer: Medicare Other | Source: Home / Self Care | Attending: Emergency Medicine | Admitting: Emergency Medicine

## 2018-08-25 ENCOUNTER — Emergency Department: Payer: Medicare Other

## 2018-08-25 ENCOUNTER — Other Ambulatory Visit: Payer: Self-pay

## 2018-08-25 DIAGNOSIS — I120 Hypertensive chronic kidney disease with stage 5 chronic kidney disease or end stage renal disease: Secondary | ICD-10-CM | POA: Insufficient documentation

## 2018-08-25 DIAGNOSIS — R0602 Shortness of breath: Secondary | ICD-10-CM | POA: Diagnosis present

## 2018-08-25 DIAGNOSIS — I252 Old myocardial infarction: Secondary | ICD-10-CM | POA: Insufficient documentation

## 2018-08-25 DIAGNOSIS — Z9119 Patient's noncompliance with other medical treatment and regimen: Secondary | ICD-10-CM | POA: Insufficient documentation

## 2018-08-25 DIAGNOSIS — Z7982 Long term (current) use of aspirin: Secondary | ICD-10-CM | POA: Insufficient documentation

## 2018-08-25 DIAGNOSIS — Z79899 Other long term (current) drug therapy: Secondary | ICD-10-CM

## 2018-08-25 DIAGNOSIS — F602 Antisocial personality disorder: Secondary | ICD-10-CM | POA: Insufficient documentation

## 2018-08-25 DIAGNOSIS — D631 Anemia in chronic kidney disease: Secondary | ICD-10-CM

## 2018-08-25 DIAGNOSIS — J180 Bronchopneumonia, unspecified organism: Secondary | ICD-10-CM | POA: Diagnosis not present

## 2018-08-25 DIAGNOSIS — B1920 Unspecified viral hepatitis C without hepatic coma: Secondary | ICD-10-CM | POA: Insufficient documentation

## 2018-08-25 DIAGNOSIS — F17210 Nicotine dependence, cigarettes, uncomplicated: Secondary | ICD-10-CM | POA: Insufficient documentation

## 2018-08-25 DIAGNOSIS — F1110 Opioid abuse, uncomplicated: Secondary | ICD-10-CM

## 2018-08-25 DIAGNOSIS — Z9115 Patient's noncompliance with renal dialysis: Secondary | ICD-10-CM

## 2018-08-25 DIAGNOSIS — N401 Enlarged prostate with lower urinary tract symptoms: Secondary | ICD-10-CM | POA: Insufficient documentation

## 2018-08-25 DIAGNOSIS — Z992 Dependence on renal dialysis: Secondary | ICD-10-CM | POA: Insufficient documentation

## 2018-08-25 DIAGNOSIS — F329 Major depressive disorder, single episode, unspecified: Secondary | ICD-10-CM

## 2018-08-25 DIAGNOSIS — R0789 Other chest pain: Secondary | ICD-10-CM | POA: Diagnosis not present

## 2018-08-25 DIAGNOSIS — I2510 Atherosclerotic heart disease of native coronary artery without angina pectoris: Secondary | ICD-10-CM | POA: Insufficient documentation

## 2018-08-25 DIAGNOSIS — G8929 Other chronic pain: Secondary | ICD-10-CM

## 2018-08-25 DIAGNOSIS — Z86711 Personal history of pulmonary embolism: Secondary | ICD-10-CM

## 2018-08-25 DIAGNOSIS — E785 Hyperlipidemia, unspecified: Secondary | ICD-10-CM

## 2018-08-25 DIAGNOSIS — Z7902 Long term (current) use of antithrombotics/antiplatelets: Secondary | ICD-10-CM | POA: Insufficient documentation

## 2018-08-25 DIAGNOSIS — N186 End stage renal disease: Secondary | ICD-10-CM | POA: Insufficient documentation

## 2018-08-25 DIAGNOSIS — N2581 Secondary hyperparathyroidism of renal origin: Secondary | ICD-10-CM | POA: Insufficient documentation

## 2018-08-25 LAB — TROPONIN I
TROPONIN I: 0.09 ng/mL — AB (ref <0.03)
Troponin I: 0.09 ng/mL (ref <0.03)
Troponin I: 0.08 ng/mL (ref <0.03)

## 2018-08-25 LAB — CBC
HCT: 28.4 % — ABNORMAL LOW (ref 39.0–52.0)
Hemoglobin: 9.4 g/dL — ABNORMAL LOW (ref 13.0–17.0)
MCH: 29 pg (ref 26.0–34.0)
MCHC: 33.1 g/dL (ref 30.0–36.0)
MCV: 87.7 fL (ref 80.0–100.0)
NRBC: 0 % (ref 0.0–0.2)
Platelets: 147 10*3/uL — ABNORMAL LOW (ref 150–400)
RBC: 3.24 MIL/uL — ABNORMAL LOW (ref 4.22–5.81)
RDW: 14.9 % (ref 11.5–15.5)
WBC: 6.7 10*3/uL (ref 4.0–10.5)

## 2018-08-25 LAB — BASIC METABOLIC PANEL
Anion gap: 11 (ref 5–15)
BUN: 58 mg/dL — ABNORMAL HIGH (ref 6–20)
CHLORIDE: 103 mmol/L (ref 98–111)
CO2: 23 mmol/L (ref 22–32)
Calcium: 8.5 mg/dL — ABNORMAL LOW (ref 8.9–10.3)
Creatinine, Ser: 7.06 mg/dL — ABNORMAL HIGH (ref 0.61–1.24)
GFR calc Af Amer: 9 mL/min — ABNORMAL LOW (ref >60)
GFR calc non Af Amer: 8 mL/min — ABNORMAL LOW (ref >60)
Glucose, Bld: 166 mg/dL — ABNORMAL HIGH (ref 70–99)
Potassium: 5 mmol/L (ref 3.5–5.1)
Sodium: 137 mmol/L (ref 135–145)

## 2018-08-25 LAB — COMPREHENSIVE METABOLIC PANEL
ALT: 32 U/L (ref 0–44)
AST: 29 U/L (ref 15–41)
Albumin: 3.3 g/dL — ABNORMAL LOW (ref 3.5–5.0)
Alkaline Phosphatase: 62 U/L (ref 38–126)
Anion gap: 8 (ref 5–15)
BUN: 49 mg/dL — ABNORMAL HIGH (ref 6–20)
CO2: 27 mmol/L (ref 22–32)
Calcium: 8.7 mg/dL — ABNORMAL LOW (ref 8.9–10.3)
Chloride: 104 mmol/L (ref 98–111)
Creatinine, Ser: 6.88 mg/dL — ABNORMAL HIGH (ref 0.61–1.24)
GFR calc Af Amer: 9 mL/min — ABNORMAL LOW (ref >60)
GFR, EST NON AFRICAN AMERICAN: 8 mL/min — AB (ref >60)
Glucose, Bld: 114 mg/dL — ABNORMAL HIGH (ref 70–99)
Potassium: 4.2 mmol/L (ref 3.5–5.1)
Sodium: 139 mmol/L (ref 135–145)
Total Bilirubin: 0.8 mg/dL (ref 0.3–1.2)
Total Protein: 7 g/dL (ref 6.5–8.1)

## 2018-08-25 LAB — CBC WITH DIFFERENTIAL/PLATELET
Abs Immature Granulocytes: 0.06 10*3/uL (ref 0.00–0.07)
Basophils Absolute: 0 10*3/uL (ref 0.0–0.1)
Basophils Relative: 0 %
Eosinophils Absolute: 0.1 10*3/uL (ref 0.0–0.5)
Eosinophils Relative: 1 %
HCT: 30.1 % — ABNORMAL LOW (ref 39.0–52.0)
Hemoglobin: 9.7 g/dL — ABNORMAL LOW (ref 13.0–17.0)
Immature Granulocytes: 1 %
Lymphocytes Relative: 14 %
Lymphs Abs: 1 10*3/uL (ref 0.7–4.0)
MCH: 28.6 pg (ref 26.0–34.0)
MCHC: 32.2 g/dL (ref 30.0–36.0)
MCV: 88.8 fL (ref 80.0–100.0)
Monocytes Absolute: 0.7 10*3/uL (ref 0.1–1.0)
Monocytes Relative: 10 %
Neutro Abs: 5.4 10*3/uL (ref 1.7–7.7)
Neutrophils Relative %: 74 %
Platelets: 152 10*3/uL (ref 150–400)
RBC: 3.39 MIL/uL — AB (ref 4.22–5.81)
RDW: 15 % (ref 11.5–15.5)
WBC: 7.2 10*3/uL (ref 4.0–10.5)
nRBC: 0 % (ref 0.0–0.2)

## 2018-08-25 LAB — ETHANOL: Alcohol, Ethyl (B): <10 mg/dL (ref <10)

## 2018-08-25 LAB — BRAIN NATRIURETIC PEPTIDE

## 2018-08-25 LAB — URINE DRUG SCREEN, QUALITATIVE (ARMC ONLY)
Amphetamines, Ur Screen: NOT DETECTED
Barbiturates, Ur Screen: NOT DETECTED
Benzodiazepine, Ur Scrn: NOT DETECTED
CANNABINOID 50 NG, UR ~~LOC~~: NOT DETECTED
Cocaine Metabolite,Ur ~~LOC~~: POSITIVE — AB
MDMA (Ecstasy)Ur Screen: NOT DETECTED
Methadone Scn, Ur: NOT DETECTED
Opiate, Ur Screen: POSITIVE — AB
PHENCYCLIDINE (PCP) UR S: NOT DETECTED
Tricyclic, Ur Screen: NOT DETECTED

## 2018-08-25 LAB — LIPASE, BLOOD: Lipase: 45 U/L (ref 11–51)

## 2018-08-25 MED ORDER — NITROGLYCERIN 0.4 MG/HR TD PT24
1.00 | MEDICATED_PATCH | TRANSDERMAL | Status: DC
Start: 2018-08-24 — End: 2018-08-25

## 2018-08-25 MED ORDER — CARVEDILOL 6.25 MG PO TABS
6.25 | ORAL_TABLET | ORAL | Status: DC
Start: 2018-08-23 — End: 2018-08-25

## 2018-08-25 MED ORDER — ALBUTEROL SULFATE (2.5 MG/3ML) 0.083% IN NEBU
2.50 | INHALATION_SOLUTION | RESPIRATORY_TRACT | Status: DC
Start: ? — End: 2018-08-25

## 2018-08-25 MED ORDER — AMLODIPINE BESYLATE 10 MG PO TABS
10.00 | ORAL_TABLET | ORAL | Status: DC
Start: 2018-08-24 — End: 2018-08-25

## 2018-08-25 MED ORDER — ATORVASTATIN CALCIUM 80 MG PO TABS
80.00 | ORAL_TABLET | ORAL | Status: DC
Start: 2018-08-23 — End: 2018-08-25

## 2018-08-25 MED ORDER — EPOETIN ALFA 10000 UNIT/ML IJ SOLN
10000.0000 [IU] | INTRAMUSCULAR | Status: DC
  Administered 2018-08-25: 10000 [IU] via INTRAVENOUS

## 2018-08-25 MED ORDER — OXYCODONE HCL ER 10 MG PO T12A
20.00 | EXTENDED_RELEASE_TABLET | ORAL | Status: DC
Start: 2018-08-23 — End: 2018-08-25

## 2018-08-25 MED ORDER — ALBUMIN HUMAN 25 % IV SOLN
25.00 | INTRAVENOUS | Status: DC
Start: ? — End: 2018-08-25

## 2018-08-25 MED ORDER — METHYLPREDNISOLONE SODIUM SUCC 125 MG IJ SOLR
60.0000 mg | Freq: Two times a day (BID) | INTRAMUSCULAR | Status: DC
  Administered 2018-08-25 – 2018-08-26 (×3): 60 mg via INTRAVENOUS
  Filled 2018-08-25 (×3): qty 2

## 2018-08-25 MED ORDER — PROMETHAZINE HCL 12.5 MG PO TABS
12.50 | ORAL_TABLET | ORAL | Status: DC
Start: ? — End: 2018-08-25

## 2018-08-25 MED ORDER — FAMOTIDINE 20 MG PO TABS
20.00 | ORAL_TABLET | ORAL | Status: DC
Start: 2018-08-24 — End: 2018-08-25

## 2018-08-25 MED ORDER — HYDRALAZINE HCL 20 MG/ML IJ SOLN: 5.0000 mg | INTRAMUSCULAR | Status: DC | PRN

## 2018-08-25 MED ORDER — IPRATROPIUM BROMIDE 0.02 % IN SOLN
500.00 | RESPIRATORY_TRACT | Status: DC
Start: ? — End: 2018-08-25

## 2018-08-25 MED ORDER — SUCRALFATE 1 G PO TABS
1.0000 g | ORAL_TABLET | Freq: Four times a day (QID) | ORAL | Status: DC
  Administered 2018-08-25 – 2018-08-26 (×5): 1 g via ORAL
  Filled 2018-08-25 (×5): qty 1

## 2018-08-25 MED ORDER — POLYETHYLENE GLYCOL 3350 17 G PO PACK: 17.0000 g | PACK | Freq: Every day | ORAL | Status: DC | PRN

## 2018-08-25 MED ORDER — ONDANSETRON HCL 4 MG/2ML IJ SOLN: 4.0000 mg | Freq: Four times a day (QID) | INTRAMUSCULAR | Status: DC | PRN

## 2018-08-25 MED ORDER — HEPARIN SODIUM (PORCINE) 5000 UNIT/ML IJ SOLN
5000.0000 [IU] | Freq: Three times a day (TID) | INTRAMUSCULAR | Status: DC
  Administered 2018-08-25 – 2018-08-26 (×4): 5000 [IU] via SUBCUTANEOUS
  Filled 2018-08-25 (×4): qty 1

## 2018-08-25 MED ORDER — OXYCODONE HCL ER 10 MG PO T12A
20.0000 mg | EXTENDED_RELEASE_TABLET | Freq: Two times a day (BID) | ORAL | Status: DC
  Administered 2018-08-25 – 2018-08-26 (×3): 20 mg via ORAL
  Filled 2018-08-25 (×3): qty 2

## 2018-08-25 MED ORDER — TAMSULOSIN HCL 0.4 MG PO CAPS
0.4000 mg | ORAL_CAPSULE | Freq: Every day | ORAL | Status: DC
  Administered 2018-08-25 – 2018-08-26 (×2): 0.4 mg via ORAL
  Filled 2018-08-25 (×2): qty 1

## 2018-08-25 MED ORDER — LABETALOL HCL 200 MG PO TABS
300.0000 mg | ORAL_TABLET | Freq: Three times a day (TID) | ORAL | Status: DC
  Administered 2018-08-25 – 2018-08-26 (×2): 300 mg via ORAL
  Filled 2018-08-25 (×6): qty 1

## 2018-08-25 MED ORDER — HEPARIN SODIUM (PORCINE) 5000 UNIT/ML IJ SOLN
5000.00 | INTRAMUSCULAR | Status: DC
Start: 2018-08-23 — End: 2018-08-25

## 2018-08-25 MED ORDER — SENNOSIDES-DOCUSATE SODIUM 8.6-50 MG PO TABS
1.0000 | ORAL_TABLET | Freq: Two times a day (BID) | ORAL | Status: DC
  Administered 2018-08-25 – 2018-08-26 (×3): 1 via ORAL
  Filled 2018-08-25 (×3): qty 1

## 2018-08-25 MED ORDER — CALCITRIOL 0.25 MCG PO CAPS
.25 | ORAL_CAPSULE | ORAL | Status: DC
Start: ? — End: 2018-08-25

## 2018-08-25 MED ORDER — ALUM & MAG HYDROXIDE-SIMETH 400-400-40 MG/5ML PO SUSP
30.00 | ORAL | Status: DC
Start: ? — End: 2018-08-25

## 2018-08-25 MED ORDER — ASPIRIN EC 81 MG PO TBEC
81.0000 mg | DELAYED_RELEASE_TABLET | Freq: Every day | ORAL | Status: DC
  Administered 2018-08-25 – 2018-08-26 (×2): 81 mg via ORAL
  Filled 2018-08-25 (×2): qty 1

## 2018-08-25 MED ORDER — GENERIC EXTERNAL MEDICATION
10.00 | Status: DC
Start: ? — End: 2018-08-25

## 2018-08-25 MED ORDER — IPRATROPIUM-ALBUTEROL 0.5-2.5 (3) MG/3ML IN SOLN
3.0000 mL | Freq: Four times a day (QID) | RESPIRATORY_TRACT | Status: DC
  Administered 2018-08-25 – 2018-08-26 (×4): 3 mL via RESPIRATORY_TRACT
  Filled 2018-08-25 (×4): qty 3

## 2018-08-25 MED ORDER — GABAPENTIN 300 MG PO CAPS
300.00 | ORAL_CAPSULE | ORAL | Status: DC
Start: 2018-08-23 — End: 2018-08-25

## 2018-08-25 MED ORDER — ONDANSETRON HCL 4 MG PO TABS: 4.0000 mg | ORAL_TABLET | Freq: Four times a day (QID) | ORAL | Status: DC | PRN

## 2018-08-25 MED ORDER — AMLODIPINE BESYLATE 10 MG PO TABS
10.0000 mg | ORAL_TABLET | Freq: Every day | ORAL | Status: DC
  Administered 2018-08-26: 10 mg via ORAL
  Filled 2018-08-25: qty 1

## 2018-08-25 MED ORDER — ATORVASTATIN CALCIUM 20 MG PO TABS
40.0000 mg | ORAL_TABLET | Freq: Every day | ORAL | Status: DC
  Administered 2018-08-25: 40 mg via ORAL
  Filled 2018-08-25: qty 2

## 2018-08-25 MED ORDER — SEVELAMER CARBONATE 800 MG PO TABS
1600.0000 mg | ORAL_TABLET | Freq: Three times a day (TID) | ORAL | Status: DC
  Administered 2018-08-25 – 2018-08-26 (×2): 1600 mg via ORAL
  Filled 2018-08-25 (×2): qty 2

## 2018-08-25 MED ORDER — CLOPIDOGREL BISULFATE 75 MG PO TABS
75.0000 mg | ORAL_TABLET | Freq: Every day | ORAL | Status: DC
  Administered 2018-08-25 – 2018-08-26 (×2): 75 mg via ORAL
  Filled 2018-08-25 (×2): qty 1

## 2018-08-25 MED ORDER — HEPARIN SODIUM (PORCINE) 1000 UNIT/ML IJ SOLN
4000.00 | INTRAMUSCULAR | Status: DC
Start: ? — End: 2018-08-25

## 2018-08-25 MED ORDER — CLOPIDOGREL BISULFATE 75 MG PO TABS
75.00 | ORAL_TABLET | ORAL | Status: DC
Start: 2018-08-24 — End: 2018-08-25

## 2018-08-25 MED ORDER — NICOTINE 14 MG/24HR TD PT24
1.00 | MEDICATED_PATCH | TRANSDERMAL | Status: DC
Start: 2018-08-24 — End: 2018-08-25

## 2018-08-25 MED ORDER — GENERIC EXTERNAL MEDICATION
12.50 | Status: DC
Start: ? — End: 2018-08-25

## 2018-08-25 MED ORDER — NICOTINE 14 MG/24HR TD PT24
14.0000 mg | MEDICATED_PATCH | Freq: Every day | TRANSDERMAL | Status: DC | PRN
  Administered 2018-08-25: 14 mg via TRANSDERMAL
  Filled 2018-08-25: qty 1

## 2018-08-25 MED ORDER — ALBUTEROL SULFATE (2.5 MG/3ML) 0.083% IN NEBU
2.5000 mg | INHALATION_SOLUTION | RESPIRATORY_TRACT | Status: DC | PRN
  Administered 2018-08-25: 2.5 mg via RESPIRATORY_TRACT
  Filled 2018-08-25: qty 3

## 2018-08-25 MED ORDER — HYDRALAZINE HCL 50 MG PO TABS
100.0000 mg | ORAL_TABLET | Freq: Three times a day (TID) | ORAL | Status: DC
  Administered 2018-08-25 – 2018-08-26 (×2): 100 mg via ORAL
  Filled 2018-08-25 (×2): qty 2

## 2018-08-25 MED ORDER — TRAZODONE HCL 50 MG PO TABS
100.00 | ORAL_TABLET | ORAL | Status: DC
Start: 2018-08-23 — End: 2018-08-25

## 2018-08-25 MED ORDER — TERAZOSIN HCL 5 MG PO CAPS
5.0000 mg | ORAL_CAPSULE | Freq: Every day | ORAL | Status: DC
  Administered 2018-08-25: 5 mg via ORAL
  Filled 2018-08-25 (×2): qty 1

## 2018-08-25 MED ORDER — ISOSORBIDE MONONITRATE ER 30 MG PO TB24
60.0000 mg | ORAL_TABLET | Freq: Every day | ORAL | Status: DC
  Administered 2018-08-26: 60 mg via ORAL
  Filled 2018-08-25: qty 2

## 2018-08-25 MED ORDER — TAMSULOSIN HCL 0.4 MG PO CAPS
0.40 | ORAL_CAPSULE | ORAL | Status: DC
Start: 2018-08-24 — End: 2018-08-25

## 2018-08-25 MED ORDER — SUCRALFATE 1 G PO TABS
1.00 | ORAL_TABLET | ORAL | Status: DC
Start: 2018-08-23 — End: 2018-08-25

## 2018-08-25 MED ORDER — TRAZODONE HCL 100 MG PO TABS: 100.0000 mg | ORAL_TABLET | Freq: Every evening | ORAL | Status: DC | PRN

## 2018-08-25 MED ORDER — ACETAMINOPHEN 325 MG PO TABS
650.00 | ORAL_TABLET | ORAL | Status: DC
Start: ? — End: 2018-08-25

## 2018-08-25 MED ORDER — ASPIRIN 81 MG PO CHEW
81.00 | CHEWABLE_TABLET | ORAL | Status: DC
Start: 2018-08-24 — End: 2018-08-25

## 2018-08-25 MED ORDER — OXYCODONE HCL 5 MG PO TABS
10.0000 mg | ORAL_TABLET | ORAL | Status: DC | PRN
  Administered 2018-08-25 – 2018-08-26 (×6): 15 mg via ORAL
  Filled 2018-08-25 (×6): qty 3

## 2018-08-25 MED ORDER — HYDRALAZINE HCL 100 MG PO TABS
100.00 | ORAL_TABLET | ORAL | Status: DC
Start: 2018-08-23 — End: 2018-08-25

## 2018-08-25 NOTE — Plan of Care (Signed)
Schedule for hemodialysis TTS. Pain managed PRN and schedule pain meds. No falls. On a renal diet. BP meds on hold due to dialysis treatment today. Breathing treatments started today.  Problem: Education: Goal: Knowledge of General Education information will improve Description Including pain rating scale, medication(s)/side effects and non-pharmacologic comfort measures Outcome: Progressing   Problem: Health Behavior/Discharge Planning: Goal: Ability to manage health-related needs will improve Outcome: Progressing   Problem: Clinical Measurements: Goal: Ability to maintain clinical measurements within normal limits will improve Outcome: Progressing Goal: Will remain free from infection Outcome: Progressing Goal: Diagnostic test results will improve Outcome: Progressing Goal: Respiratory complications will improve Outcome: Progressing Goal: Cardiovascular complication will be avoided Outcome: Progressing   Problem: Activity: Goal: Risk for activity intolerance will decrease Outcome: Progressing   Problem: Nutrition: Goal: Adequate nutrition will be maintained Outcome: Progressing   Problem: Coping: Goal: Level of anxiety will decrease Outcome: Progressing   Problem: Elimination: Goal: Will not experience complications related to bowel motility Outcome: Progressing Goal: Will not experience complications related to urinary retention Outcome: Progressing   Problem: Pain Managment: Goal: General experience of comfort will improve Outcome: Progressing   Problem: Safety: Goal: Ability to remain free from injury will improve Outcome: Progressing   Problem: Skin Integrity: Goal: Risk for impaired skin integrity will decrease Outcome: Progressing

## 2018-08-25 NOTE — Progress Notes (Signed)
HD tx start    08/25/18 1825  Vital Signs  Pulse Rate 74  Pulse Rate Source Monitor  Resp 17  BP (!) 160/93  BP Location Right Arm  BP Method Automatic  Patient Position (if appropriate) Lying  Oxygen Therapy  SpO2 99 %  O2 Device Nasal Cannula (pper pt request , comfort)  O2 Flow Rate (L/min) 2 L/min  During Hemodialysis Assessment  Blood Flow Rate (mL/min) 400 mL/min  Arterial Pressure (mmHg) -180 mmHg  Venous Pressure (mmHg) 180 mmHg  Transmembrane Pressure (mmHg) 60 mmHg  Ultrafiltration Rate (mL/min) 1330 mL/min  Dialysate Flow Rate (mL/min) 600 ml/min  Conductivity: Machine  13.8  HD Safety Checks Performed Yes  Dialysis Fluid Bolus Normal Saline  Bolus Amount (mL) 250 mL  Intra-Hemodialysis Comments Tx initiated  Fistula / Graft Left Upper arm Arteriovenous fistula  No Placement Date or Time found.   Placed prior to admission: Yes  Orientation: Left  Access Location: Upper arm  Access Type: Arteriovenous fistula  Status Accessed  Needle Size 15

## 2018-08-25 NOTE — Progress Notes (Signed)
HD tx end    08/25/18 2133  Vital Signs  Pulse Rate 80  Pulse Rate Source Monitor  Resp (!) 27  BP (!) 161/94  BP Location Right Arm  BP Method Automatic  Patient Position (if appropriate) Lying  Oxygen Therapy  SpO2 100 %  O2 Device Nasal Cannula  O2 Flow Rate (L/min) 2 L/min  During Hemodialysis Assessment  Dialysis Fluid Bolus Normal Saline  Bolus Amount (mL) 250 mL  Intra-Hemodialysis Comments Tx completed

## 2018-08-25 NOTE — Progress Notes (Signed)
Post HD assessment. Pt did not tolerate tx well running as prescribed. Pt c/o tightness in chest and being unable to catch his breath, BFR and UF goal decreased to 200 and 2.5L.Pt requested 02 for comfort, pt on [place] at 2L,MD aware. Pt tolerated tx well after adjustments were made, MD aware. Net UF 2502, goal not met.    08/25/18 2141  Vital Signs  Temp 97.6 F (36.4 C)  Temp Source Oral  Pulse Rate 78  Pulse Rate Source Monitor  Resp (!) 24  BP (!) 154/90  BP Location Right Arm  BP Method Automatic  Patient Position (if appropriate) Lying  Oxygen Therapy  SpO2 100 %  O2 Device Nasal Cannula  O2 Flow Rate (L/min) 2 L/min  Dialysis Weight  Weight 124.8 kg  Type of Weight Post-Dialysis  Post-Hemodialysis Assessment  Rinseback Volume (mL) 250 mL  KECN 38.1 V  Dialyzer Clearance Lightly streaked  Duration of HD Treatment -hour(s) 3 hour(s)  Hemodialysis Intake (mL) 500 mL  UF Total -Machine (mL) 3002 mL  Net UF (mL) 2502 mL  Tolerated HD Treatment Yes  AVG/AVF Arterial Site Held (minutes) 10 minutes  AVG/AVF Venous Site Held (minutes) 10 minutes  Education / Care Plan  Dialysis Education Provided Yes  Documented Education in Care Plan Yes  Fistula / Graft Left Upper arm Arteriovenous fistula  No Placement Date or Time found.   Placed prior to admission: Yes  Orientation: Left  Access Location: Upper arm  Access Type: Arteriovenous fistula  Site Condition No complications  Fistula / Graft Assessment Present;Bruit;Thrill  Status Deaccessed  Drainage Description None

## 2018-08-25 NOTE — ED Triage Notes (Signed)
Patient reports being short of breath all night.  States he was unable to sleep.  Patient grunting in triage, but is able to speak in full sentences when talking.

## 2018-08-25 NOTE — Progress Notes (Signed)
Post HD assessment    08/25/18 2140  Neurological  Level of Consciousness Alert  Orientation Level Oriented X4  Respiratory  Respiratory Pattern Regular;Unlabored  Chest Assessment Chest expansion symmetrical  Cardiac  Pulse Irregular  ECG Monitor Yes  Cardiac Rhythm NSR  Vascular  R Radial Pulse +2  L Radial Pulse +2  Edema Generalized  Integumentary  Integumentary (WDL) X  Skin Color Appropriate for ethnicity  Musculoskeletal  Musculoskeletal (WDL) X  Generalized Weakness Yes  Assistive Device None  GU Assessment  Genitourinary (WDL) X  Genitourinary Symptoms  (HD)  Psychosocial  Psychosocial (WDL) WDL

## 2018-08-25 NOTE — Progress Notes (Signed)
Pre HD assessment    08/25/18 1815  Vital Signs  Temp 97.9 F (36.6 C)  Temp Source Oral  Pulse Rate 76  Pulse Rate Source Monitor  Resp 18  BP (!) 159/97  BP Location Right Arm  BP Method Automatic  Patient Position (if appropriate) Lying  Oxygen Therapy  SpO2 100 %  O2 Device Room Air  Pain Assessment  Pain Scale 0-10  Pain Score 0  Dialysis Weight  Weight 127.1 kg  Type of Weight Pre-Dialysis  Time-Out for Hemodialysis  What Procedure? HD  Pt Identifiers(min of two) First/Last Name;MRN/Account#  Correct Site? Yes  Correct Side? Yes  Correct Procedure? Yes  Consents Verified? Yes  Rad Studies Available? N/A  Safety Precautions Reviewed? Yes  Engineer, civil (consulting) Number  (5A)  Station Number 1  UF/Alarm Test Passed  Conductivity: Meter 13.8  Conductivity: Machine  13.8  pH 7.4  Reverse Osmosis main  Normal Saline Lot Number [id number]  Dialyzer Lot Number [id number]  Disposable Set Lot Number [id number]  Machine Temperature 98.6 F (37 C)  Musician and Audible Yes  Blood Lines Intact and Secured Yes  Pre Treatment Patient Checks  Vascular access used during treatment Fistula  Hepatitis B Surface Antigen Results Negative  Date Hepatitis B Surface Antigen Drawn 08/15/18  Hepatitis B Surface Antibody  (>10)  Date Hepatitis B Surface Antibody Drawn 08/15/18  Hemodialysis Consent Verified Yes  Hemodialysis Standing Orders Initiated Yes  ECG (Telemetry) Monitor On Yes  Prime Ordered Normal Saline  Length of  DialysisTreatment -hour(s) 3 Hour(s)  Dialyzer Elisio 17H NR  Dialysate 2K, 2.5 Ca  Dialysis Anticoagulant None  Dialysate Flow Ordered 600  Blood Flow Rate Ordered 400 mL/min  Ultrafiltration Goal 3.5 Liters  Pre Treatment Labs CBC;Other (Comment) (BMP)  Dialysis Blood Pressure Support Ordered Normal Saline  Education / Care Plan  Dialysis Education Provided Yes  Documented Education in Care Plan Yes  Fistula / Graft Left Upper arm  Arteriovenous fistula  No Placement Date or Time found.   Placed prior to admission: Yes  Orientation: Left  Access Location: Upper arm  Access Type: Arteriovenous fistula  Site Condition No complications  Fistula / Graft Assessment Present;Thrill;Bruit  Drainage Description None

## 2018-08-25 NOTE — Progress Notes (Signed)
[facility]  ROUNDING NOTE   Subjective:   [name] admitted to [facility] on 08/25/2018 for Shortness of breath [R06.02] Noncompliance with renal dialysis [facility]) [Z91.15] ESRD on hemodialysis ([place]) [N18.6, Z99.2]  Patient was discharged from [facility] from 2/9 to 2/11. Patient then was admitted to [facility] where he received hemodialysis treatment on 2/13.   Patient is scheduled for hemodialysis today. Complains of shortness of breath. He is walking around the room.   Objective:  Vital signs in last 24 hours:  Temp:  [97.8 F (36.6 C)] 97.8 F (36.6 C) (02/15 0852) Pulse Rate:  [65-86] 65 (02/15 0935) Resp:  [22-31] 22 (02/15 0852) BP: (157-178)/(83-104) 157/87 (02/15 0935) SpO2:  [97 %-100 %] 99 % (02/15 0935) Weight:  [125.5 kg] 125.5 kg (02/15 1140)  Weight change:  Filed Weights   08/25/18 1140  Weight: 125.5 kg    Intake/Output: No intake/output data recorded.   Intake/Output this shift:  No intake/output data recorded.  Physical Exam: General: No acute distress  Head: Normocephalic, atraumatic. Moist oral mucosal membranes  Eyes: Anicteric  Neck: Supple, trachea midline  Lungs:  clear  Heart: S1S2 no rubs  Abdomen:  Soft, nontender, bowel sounds present  Extremities: no peripheral edema.  Neurologic: Awake, alert, following commands  Skin: No lesions  Access: LUE AV Fistula    Basic Metabolic Panel: Recent Labs  Lab 08/19/18 2236 08/25/18 0526  NA 140 139  K 4.2 4.2  CL 103 104  CO2 28 27  GLUCOSE 113* 114*  BUN 52* 49*  CREATININE 8.51* 6.88*  CALCIUM 8.3* 8.7*    Liver Function Tests: Recent Labs  Lab 08/19/18 2236 08/25/18 0526  AST 27 29  ALT 32 32  ALKPHOS 47 62  BILITOT 0.6 0.8  PROT 6.6 7.0  ALBUMIN 3.4* 3.3*   Recent Labs  Lab 08/25/18 0526  LIPASE 45   No results for input(s): AMMONIA in the last 168 hours.  CBC: Recent Labs  Lab 08/19/18 2331 08/25/18 0526  WBC 5.8 7.2  NEUTROABS  --  5.4   HGB 9.2* 9.7*  HCT 28.5* 30.1*  MCV 89.6 88.8  PLT 109* 152    Cardiac Enzymes: Recent Labs  Lab 08/20/18 0412 08/20/18 0945 08/20/18 1552 08/25/18 0526 08/25/18 0908  TROPONINI 0.08* 0.06* 0.07* 0.09* 0.09*    BNP: Invalid input(s): POCBNP  CBG: No results for input(s): GLUCAP in the last 168 hours.  Microbiology: Results for orders placed or performed during the hospital encounter of 08/14/18  MRSA PCR Screening     Status: None   Collection Time: 08/15/18  1:12 AM  Result Value Ref Range Status   MRSA by PCR NEGATIVE NEGATIVE Final    Comment:        The GeneXpert MRSA Assay (FDA approved for NASAL specimens only), is one component of a comprehensive MRSA colonization surveillance program. It is not intended to diagnose MRSA infection nor to guide or monitor treatment for MRSA infections. Performed at [facility], [address] [place], [place] [address]     Coagulation Studies: No results for input(s): LABPROT, INR in the last 72 hours.  Urinalysis: No results for input(s): COLORURINE, LABSPEC, PHURINE, GLUCOSEU, HGBUR, BILIRUBINUR, KETONESUR, PROTEINUR, UROBILINOGEN, NITRITE, LEUKOCYTESUR in the last 72 hours.  Invalid input(s): APPERANCEUR    Imaging: Dg Chest Portable 1 View  Result Date: 08/25/2018 CLINICAL DATA:  Shortness of breath EXAM: PORTABLE CHEST 1 VIEW COMPARISON:  August 19, 2018 FINDINGS: There is  no edema or consolidation. There is cardiomegaly with pulmonary vascularity normal. No adenopathy. No bone lesions. IMPRESSION: Stable cardiomegaly.  No edema or consolidation. Electronically Signed   By: Lowella Grip III M.D.   On: 08/25/2018 05:54     Medications:    . amLODipine  10 mg Oral Daily  . aspirin EC  81 mg Oral Daily  . atorvastatin  40 mg Oral QHS  . clopidogrel  75 mg Oral Daily  . epoetin (EPOGEN/PROCRIT) injection  10,000 Units Intravenous Q T,Th,Sa-HD  . heparin  5,000 Units Subcutaneous Q8H  .  hydrALAZINE  100 mg Oral TID  . ipratropium-albuterol  3 mL Nebulization Q6H  . isosorbide mononitrate  60 mg Oral Daily  . labetalol  300 mg Oral TID  . methylPREDNISolone (SOLU-MEDROL) injection  60 mg Intravenous Q12H  . oxyCODONE  20 mg Oral Q12H  . senna-docusate  1 tablet Oral BID  . sevelamer carbonate  1,600 mg Oral TID WC  . sucralfate  1 g Oral QID  . tamsulosin  0.4 mg Oral Daily  . terazosin  5 mg Oral QHS   albuterol, hydrALAZINE, nicotine, ondansetron **OR** ondansetron (ZOFRAN) IV, oxyCODONE, polyethylene glycol, traZODone  Assessment/ Plan:  [name] is a 63 y.o. black male with end stage renal disease on hemodialysis, depression, hypertension, pulmonary embolism.   1. End-stage renal disease: Patient gets outpatient dialysis in [facility].    Patient has been accepted at [facility] as a transient patient but has yet to make an outpatient treatment.  Dialysis scheduled for today. Orders prepared.  - Social work to work with patient about current psychosocial and drug abuse issues.   2. Hypertension: blood pressure at goal.  157/87 - Continue amlodipine, hydralazine, labetalol, and terazosin  3.  Anemia chronic kidney disease.  With thrombocytopenia   - EPO with HD treatment  4.  Secondary hyperparathyroidism with hyperphosphatemia, phos 5.9 on 2/7.  - Renvela with meals.    LOS: 0 [name] 2/15/202012:30 PM

## 2018-08-25 NOTE — H&P (Signed)
[facility] [place] NAME: [name]    MR#:  [id number]  DATE OF BIRTH:  Jun 04, 1956  DATE OF ADMISSION:  08/25/2018  PRIMARY CARE PHYSICIAN: Patient, No Pcp Per   REQUESTING/REFERRING PHYSICIAN: Hinda Kehr, MD  CHIEF COMPLAINT:   Chief Complaint  Patient presents with  . Shortness of Breath    HISTORY OF PRESENT ILLNESS:  [name]  is a 63 y.o. male with a known history of ESRD on HD, hypertension, CAD status post MI, hepatitis C, and hyperlipidemia who presented to the ED with shortness of breath.  The shortness of breath has been going on for the last 1 to 2 days.  He denies any cough or fevers.  He denies any chest pain.  Of note, this is his fifth ED visit and third admission in the last 2 weeks.  He has been set up with outpatient HD, but has not gone.  UDS is consistently positive for cocaine and opiates.  Patient denies any drug use.   In the ED, BP was elevated.  Labs were significant for BNP greater than 4500.  Troponin 0.09.  Hemoglobin 9.7.  UDS positive for cocaine and opiates.  Chest x-ray negative.  Hospitalists were called for admission.  PAST MEDICAL HISTORY:   Past Medical History:  Diagnosis Date  . Depression   . Heart attack ([place])   . Hypertension   . PE (pulmonary embolism)   . Renal disorder     PAST SURGICAL HISTORY:   Past Surgical History:  Procedure Laterality Date  . TOTAL HIP ARTHROPLASTY      SOCIAL HISTORY:   Social History   Tobacco Use  . Smoking status: Current Every Day Smoker    Packs/day: 0.50    Types: Cigarettes  . Smokeless tobacco: Never Used  Substance Use Topics  . Alcohol use: No    FAMILY HISTORY:  Father-heart disease  DRUG ALLERGIES:   Allergies  Allergen Reactions  . Cyclobenzaprine Nausea Only  . Clonidine Other (See Comments)    Asymptomatic bradycardia to 38  . Furosemide Other (See Comments)    Caused renal failure  . Tylenol  [Acetaminophen] Other (See Comments)    Reaction:  Pt states it bothers his liver    REVIEW OF SYSTEMS:   Review of Systems  Constitutional: Negative for chills and fever.  HENT: Negative for congestion and sore throat.   Eyes: Negative for blurred vision and double vision.  Respiratory: Positive for shortness of breath and wheezing. Negative for cough.   Cardiovascular: Positive for leg swelling. Negative for chest pain and palpitations.  Gastrointestinal: Negative for nausea and vomiting.  Genitourinary: Negative for dysuria and urgency.  Musculoskeletal: Negative for back pain and neck pain.  Neurological: Negative for dizziness and headaches.  Psychiatric/Behavioral: Negative for depression. The patient is not nervous/anxious.     MEDICATIONS AT HOME:   Prior to Admission medications   Medication Sig Start Date End Date Taking? Authorizing Provider  amLODipine (NORVASC) 10 MG tablet Take 10 mg by mouth daily. 11/27/15   [provider]  aspirin EC 81 MG tablet Take 81 mg by mouth daily.    [provider]  atorvastatin (LIPITOR) 40 MG tablet Take 40 mg by mouth at bedtime.  11/27/15   [provider]  clopidogrel (PLAVIX) 75 MG tablet Take 75 mg by mouth daily. 06/21/16   [provider]  hydrALAZINE (APRESOLINE) 100 MG tablet Take 1 tablet (100  mg total) by mouth 3 (three) times daily. 09/27/16   Gladstone Lighter, MD  isosorbide mononitrate (IMDUR) 60 MG 24 hr tablet Take 1 tablet (60 mg total) by mouth daily. 09/28/16   Gladstone Lighter, MD  labetalol (NORMODYNE) 300 MG tablet Take 1 tablet (300 mg total) by mouth 3 (three) times daily. 09/27/16   Gladstone Lighter, MD  oxyCODONE (OXYCONTIN) 20 mg 12 hr tablet Take 20 mg by mouth every 12 (twelve) hours. 04/11/16   [provider]  Oxycodone HCl 10 MG TABS Take 10-20 mg by mouth every 4 (four) hours as needed (for pain).  04/11/16   [provider]  polyethylene glycol (MIRALAX  / GLYCOLAX) packet Take 17 g by mouth daily as needed for mild constipation.  11/27/15   [provider]  senna-docusate (SENOKOT-S) 8.6-50 MG tablet Take 1 tablet by mouth 2 (two) times daily. 09/27/16   Gladstone Lighter, MD  sevelamer carbonate (RENVELA) 800 MG tablet Take 2 tablets (1,600 mg total) by mouth 3 (three) times daily with meals. 08/15/18   Kyrene Longan, Pete Pelt, MD  sucralfate (CARAFATE) 1 g tablet Take 1 tablet (1 g total) by mouth 4 (four) times daily. 05/29/16   Carrie Mew, MD  tamsulosin (FLOMAX) 0.4 MG CAPS capsule Take 1 capsule (0.4 mg total) by mouth daily. 09/27/16   Gladstone Lighter, MD  terazosin (HYTRIN) 5 MG capsule Take 1 capsule (5 mg total) by mouth at bedtime. 09/27/16   Gladstone Lighter, MD  traZODone (DESYREL) 100 MG tablet Take 100 mg by mouth at bedtime as needed for sleep.  10/22/15   [provider]      VITAL SIGNS:  Blood pressure (!) 165/83, pulse 86, temperature 97.8 F (36.6 C), temperature source Oral, resp. rate (!) 22, SpO2 100 %.  PHYSICAL EXAMINATION:  Physical Exam  GENERAL:  63 y.o.-year-old patient lying in the bed with no acute distress.  EYES: Pupils equal, round, reactive to light and accommodation. No scleral icterus. Extraocular muscles intact.  HEENT: Head atraumatic, normocephalic. Oropharynx and nasopharynx clear.  NECK:  Supple, no jugular venous distention. No thyroid enlargement, no tenderness.  LUNGS: + Diffuse expiratory wheezing present. No use of accessory muscles of respiration.  CARDIOVASCULAR: RRR, S1, S2 normal. No murmurs, rubs, or gallops.  ABDOMEN: Soft, nontender, nondistended. Bowel sounds present. No organomegaly or mass.  EXTREMITIES: No pedal edema, cyanosis, or clubbing.  NEUROLOGIC: Cranial nerves II through XII are intact. Muscle strength 5/5 in all extremities. Sensation intact. Gait not checked.  PSYCHIATRIC: The patient is alert and oriented x 3.  SKIN: No obvious rash, lesion, or ulcer.     LABORATORY PANEL:   CBC Recent Labs  Lab 08/25/18 0526  WBC 7.2  HGB 9.7*  HCT 30.1*  PLT 152   ------------------------------------------------------------------------------------------------------------------  Chemistries  Recent Labs  Lab 08/25/18 0526  NA 139  K 4.2  CL 104  CO2 27  GLUCOSE 114*  BUN 49*  CREATININE 6.88*  CALCIUM 8.7*  AST 29  ALT 32  ALKPHOS 62  BILITOT 0.8   ------------------------------------------------------------------------------------------------------------------  Cardiac Enzymes Recent Labs  Lab 08/25/18 0526  TROPONINI 0.09*   ------------------------------------------------------------------------------------------------------------------  RADIOLOGY:  Dg Chest Portable 1 View  Result Date: 08/25/2018 CLINICAL DATA:  Shortness of breath EXAM: PORTABLE CHEST 1 VIEW COMPARISON:  August 19, 2018 FINDINGS: There is no edema or consolidation. There is cardiomegaly with pulmonary vascularity normal. No adenopathy. No bone lesions. IMPRESSION: Stable cardiomegaly.  No edema or consolidation. Electronically Signed  By: Lowella Grip III M.D.   On: 08/25/2018 05:54      IMPRESSION AND PLAN:   Shortness of breath- possibly due to volume overload vs COPD exacerbation.  Last received HD on 2/11.  He does have diffuse wheezing on exam, consistent with COPD exacerbation.  Stable on room air. -Solu-Medrol IV -DuoNebs PRN -Will ambulate with pulse ox prior to discharge  ESRD on HD- receives HD TTS -Nephrology consult for HD  CAD- stable, no active chest pain. -Troponins flat and at baseline -Continue aspirin, plavix, and imdur -Discussed cessation from cocaine  Hypertension- BPs elevated, likely related to volume overload -Continue home norvasc, hydralazine, labetalol, terazosin  Chronic pain- stable -Continue home oxycontin and oxycodone  BPH- stable  -Continue home flomax  Hyperlipidemia- stable -Continue home  lipitor  Anemia of chronic kidney disease- hemoglobin at baseline -Monitor  Tobacco use- smokes half a pack per day -Nicotine patch   All the records are reviewed and case discussed with ED provider. Management plans discussed with the patient, family and they are in agreement.  CODE STATUS: Full  TOTAL TIME TAKING CARE OF THIS PATIENT: 45 minutes.    Berna Spare Ritvik Mczeal M.D on 08/25/2018 at 7:51 AM  Between 7am to 6pm - Pager - [phone]  After 6pm go to [email or web address] - Proofreader  [facility]  Office  [phone]  CC: Primary care physician; Patient, No Pcp Per   Note: This dictation was prepared with Dragon dictation along with smaller phrase technology. Any transcriptional errors that result from this process are unintentional.

## 2018-08-25 NOTE — Progress Notes (Signed)
Pre HD assessment    08/25/18 1816  Neurological  Level of Consciousness Alert  Orientation Level Oriented X4  Respiratory  Respiratory Pattern Regular;Unlabored  Chest Assessment Chest expansion symmetrical  Cardiac  Pulse Regular  ECG Monitor Yes  Cardiac Rhythm NSR  Vascular  R Radial Pulse +2  L Radial Pulse +2  Edema Generalized  Integumentary  Integumentary (WDL) X  Skin Color Appropriate for ethnicity  Musculoskeletal  Musculoskeletal (WDL) X  Generalized Weakness Yes  Assistive Device None  GU Assessment  Genitourinary (WDL) X  Genitourinary Symptoms  (HD)  Psychosocial  Psychosocial (WDL) WDL

## 2018-08-25 NOTE — ED Provider Notes (Signed)
[facility] Emergency Department Provider Note  ____________________________________________   First MD Initiated Contact with Patient 08/25/18 [phone]     (approximate)  I have reviewed the triage vital signs and the nursing notes.   HISTORY  Chief Complaint Shortness of Breath    HPI [name] is a 63 y.o. male with medical history as listed below which includes a number of chronic conditions including a prior heart attack, pulmonary embolism, and end-stage renal disease on dialysis.  He also has a well-documented history of substance abuse including cocaine, antisocial personality disorder, substance-induced mood disorder, opiate abuse, chronic pain, and noncompliance with his treatment regimens.  He presents for his third visit within the [facility] system in 6 months and his second visit at [facility] emergency department within the last 4 to 5 days.  He was also at [facility] 2 to 3 days ago.  He presents by private vehicle reporting severe shortness of breath.  He states that he has been short of breath all night and has been unable to sleep.  He is grunting with his breaths but when he answers questions and is distracted he is able to speak in full sentences.  He says that exertion makes his symptoms worse and nothing in particular makes it better.  He is also reporting some intermittent chest pain.  These were the same symptoms that he reported 4 to 5 days ago when he was in the emergency department and admitted.  See hospital course for additional details.  He denies fever/chills, nausea, and vomiting.  He adamantly denies cocaine use more recently that about a month ago even though urine tox screens continually report positive cocaine.  He states that he is from [place] but he is in town visiting his parents.  Past Medical History:  Diagnosis Date  . Depression   . Heart attack ([place])   . Hypertension   . PE (pulmonary embolism)   . Renal disorder      Patient Active Problem List   Diagnosis Date Noted  . Chest pain 08/20/2018  . ESRD on dialysis ([place]) 11/08/2016  . Prostate cancer screening 11/08/2016  . Acquired cyst of kidney 11/08/2016  . Urinary urgency 11/08/2016  . Acute on chronic renal failure ([place]) 09/23/2016  . Hypertensive urgency 09/21/2016  . Dysthymia 02/26/2016  . Chronic pain 02/26/2016  . Noncompliance 02/26/2016  . Opiate abuse, continuous ([place]) 02/03/2016  . Substance induced mood disorder ([place]) 02/03/2016  . Antisocial personality disorder ([place]) 02/03/2016  . Left-sided weakness 01/18/2016  . HEPATITIS C 06/28/2008  . HLD (hyperlipidemia) 06/28/2008  . SUBSTANCE ABUSE, MULTIPLE 06/28/2008  . Hypertension 06/27/2008    Past Surgical History:  Procedure Laterality Date  . TOTAL HIP ARTHROPLASTY      Prior to Admission medications   Medication Sig Start Date End Date Taking? Authorizing Provider  amLODipine (NORVASC) 10 MG tablet Take 10 mg by mouth daily. 11/27/15   [provider]  aspirin EC 81 MG tablet Take 81 mg by mouth daily.    [provider]  atorvastatin (LIPITOR) 40 MG tablet Take 40 mg by mouth at bedtime.  11/27/15   [provider]  clopidogrel (PLAVIX) 75 MG tablet Take 75 mg by mouth daily. 06/21/16   [provider]  hydrALAZINE (APRESOLINE) 100 MG tablet Take 1 tablet (100 mg total) by mouth 3 (three) times daily. 09/27/16   Gladstone Lighter, MD  isosorbide mononitrate (IMDUR) 60 MG 24 hr tablet Take 1 tablet (  60 mg total) by mouth daily. 09/28/16   Gladstone Lighter, MD  labetalol (NORMODYNE) 300 MG tablet Take 1 tablet (300 mg total) by mouth 3 (three) times daily. 09/27/16   Gladstone Lighter, MD  oxyCODONE (OXYCONTIN) 20 mg 12 hr tablet Take 20 mg by mouth every 12 (twelve) hours. 04/11/16   [provider]  Oxycodone HCl 10 MG TABS Take 10-20 mg by mouth every 4 (four) hours as needed (for pain).  04/11/16   [provider]   polyethylene glycol (MIRALAX / GLYCOLAX) packet Take 17 g by mouth daily as needed for mild constipation.  11/27/15   [provider]  senna-docusate (SENOKOT-S) 8.6-50 MG tablet Take 1 tablet by mouth 2 (two) times daily. 09/27/16   Gladstone Lighter, MD  sevelamer carbonate (RENVELA) 800 MG tablet Take 2 tablets (1,600 mg total) by mouth 3 (three) times daily with meals. 08/15/18   Mayo, Pete Pelt, MD  sucralfate (CARAFATE) 1 g tablet Take 1 tablet (1 g total) by mouth 4 (four) times daily. 05/29/16   Carrie Mew, MD  tamsulosin (FLOMAX) 0.4 MG CAPS capsule Take 1 capsule (0.4 mg total) by mouth daily. 09/27/16   Gladstone Lighter, MD  terazosin (HYTRIN) 5 MG capsule Take 1 capsule (5 mg total) by mouth at bedtime. 09/27/16   Gladstone Lighter, MD  traZODone (DESYREL) 100 MG tablet Take 100 mg by mouth at bedtime as needed for sleep.  10/22/15   [provider]    Allergies Cyclobenzaprine; Clonidine; Furosemide; and Tylenol [acetaminophen]  No family history on file.  Social History Social History   Tobacco Use  . Smoking status: Current Every Day Smoker    Packs/day: 0.50    Types: Cigarettes  . Smokeless tobacco: Never Used  Substance Use Topics  . Alcohol use: No  . Drug use: No    Review of Systems Constitutional: No fever/chills Eyes: No visual changes. ENT: No sore throat. Cardiovascular: Denies chest pain. Respiratory: Denies shortness of breath. Gastrointestinal: No abdominal pain.  No nausea, no vomiting.  No diarrhea.  No constipation. Genitourinary: Negative for dysuria. Musculoskeletal: Negative for neck pain.  Negative for back pain. Integumentary: Negative for rash. Neurological: Negative for headaches, focal weakness or numbness.   ____________________________________________   PHYSICAL EXAM:  VITAL SIGNS: ED Triage Vitals [08/25/18 0511]  Enc Vitals Group     BP (!) 165/83     Pulse Rate 86     Resp (!) 22     Temp 97.8 F  (36.6 C)     Temp Source Oral     SpO2 100 %     Weight      Height      Head Circumference      Peak Flow      Pain Score      Pain Loc      Pain Edu?      Excl. in [place]?     Constitutional: Alert and oriented.  Patient appears to be in at least moderate respiratory distress but he is distractible and speaking in full sentences when answering questions. Eyes: Conjunctivae are normal.  Head: Atraumatic. Nose: No congestion/rhinnorhea. Mouth/Throat: Mucous membranes are moist. Neck: No stridor.  No meningeal signs.   Cardiovascular: Normal rate, regular rhythm. Good peripheral circulation. Grossly normal heart sounds. Respiratory: Tachypnea with some grunting respirations but no accessory muscle usage.  Speaks in full sentences when asked to do so. Gastrointestinal: Soft and nontender. No distention.  Musculoskeletal: 1+ pitting edema of  lower extremities bilaterally. Neurologic:  Normal speech and language. No gross focal neurologic deficits are appreciated.  Skin:  Skin is warm, dry and intact. No rash noted.   ____________________________________________   LABS (all labs ordered are listed, but only abnormal results are displayed)  Labs Reviewed  BRAIN NATRIURETIC PEPTIDE - Abnormal; Notable for the following components:      Result Value   B Natriuretic Peptide >4,500.0 (*)    All other components within normal limits  URINE DRUG SCREEN, QUALITATIVE ([facility] ONLY) - Abnormal; Notable for the following components:   Cocaine Metabolite,Ur  POSITIVE (*)    Opiate, Ur Screen POSITIVE (*)    All other components within normal limits  TROPONIN I - Abnormal; Notable for the following components:   Troponin I 0.09 (*)    All other components within normal limits  COMPREHENSIVE METABOLIC PANEL - Abnormal; Notable for the following components:   Glucose, Bld 114 (*)    BUN 49 (*)    Creatinine, Ser 6.88 (*)    Calcium 8.7 (*)    Albumin 3.3 (*)    GFR calc non Af Amer 8 (*)      GFR calc Af Amer 9 (*)    All other components within normal limits  CBC WITH DIFFERENTIAL/PLATELET - Abnormal; Notable for the following components:   RBC 3.39 (*)    Hemoglobin 9.7 (*)    HCT 30.1 (*)    All other components within normal limits  LIPASE, BLOOD  ETHANOL   ____________________________________________  EKG  ED ECG REPORT I, Hinda Kehr, the attending physician, personally viewed and interpreted this ECG.  Date: 08/25/2018 EKG Time: 5:21 AM Rate: 80 Rhythm: normal sinus rhythm QRS Axis: Left axis deviation Intervals: normal ST/T Wave abnormalities: Non-specific ST segment / T-wave changes, but no clear evidence of acute ischemia. Narrative Interpretation: no definitive evidence of acute ischemia; does not meet STEMI criteria.   ____________________________________________  RADIOLOGY I, Hinda Kehr, personally viewed and evaluated these images (plain radiographs) as part of my medical decision making, as well as reviewing the written report by the radiologist.  ED MD interpretation: Stable cardiomegaly without evidence of pulmonary edema or any consolidation  Official radiology report(s): Dg Chest Portable 1 View  Result Date: 08/25/2018 CLINICAL DATA:  Shortness of breath EXAM: PORTABLE CHEST 1 VIEW COMPARISON:  August 19, 2018 FINDINGS: There is no edema or consolidation. There is cardiomegaly with pulmonary vascularity normal. No adenopathy. No bone lesions. IMPRESSION: Stable cardiomegaly.  No edema or consolidation. Electronically Signed   By: Lowella Grip III M.D.   On: 08/25/2018 05:54    ____________________________________________   PROCEDURES  Critical Care performed: No   Procedure(s) performed:   Procedures   ____________________________________________   INITIAL IMPRESSION / ASSESSMENT AND PLAN / ED COURSE  As part of my medical decision making, I reviewed the following data within the [facility]  notes reviewed and incorporated, Labs reviewed , EKG interpreted , Old chart reviewed, Radiograph reviewed , Discussed with admitting physician , Discussed with nephrologist and Notes from prior ED visits    Differential diagnosis includes, but is not limited to, noncompliance with dialysis leading to acute shortness of breath, pulmonary edema, pneumonia, ACS, pulmonary embolism, malingering.  The patient is a clinical challenge because of his well-documented medical and psychiatric conditions.  He tested positive for cocaine 5 days ago at [facility] and denied using any cocaine within the last month.  He then tested positive again 2  days later at [facility]  He was transferred from [facility] to the main [facility] hospital and had a brief stay with dialysis which was about 2 to 3 days after his last admission to [facility] where he received dialysis and cardiology evaluation and discharge.  Now, 2 days later, he is back at [facility] reporting the same symptoms.  I believe he is doing this in order to receive dialysis rather than going to an outpatient center.  I reviewed the medical record and saw that Dr. Juleen China with nephrology set up the patient with dialysis at the [facility] in [facility] for February 12 after he was discharged from [facility] on February 11, but instead of going for dialysis in the morning on February 12, he went to the [facility] emergency department later in the day.  It is difficult to understand if this is simply for him to get dialysis, if there is a new or acute and potentially emergent or life-threatening medical problem going on, or if there is a secondary gain that I cannot establish.  He is not asking for pain medication although he has received morphine and Dilaudid in the past during other emergency department visits as indicated by his medical record.  Currently his work-up is reassuring.  In addition to speaking in full sentences when he wants to do so, he has a reassuring EKG and a chest x-ray  that demonstrates no evidence of pulmonary edema.  He had a slightly elevated troponin the last time he was at [facility] which was downtrending upon his hospitalization.  His CBC is stable and ethanol level is negative.  The rest of his labs are pending.  I am not certain at this time if he truly requires admission for dialysis or if he can follow-up as an outpatient.  Of note, his medical record indicates that he went a month without dialysis at one point relatively recently.  Clinical Course as of Aug 25 757  Sat Aug 25, 2018  9702 No evidence of edema or consolidation.  DG Chest Portable 1 View [CF]  [phone] I spoke by phone with Dr. Juleen China and explained the situation.  He agreed that he would dialyze the patient later today.  I then spoke by phone with Dr. Brett Albino with the hospitalist service who knows the patient from his last visit and will admit him for further treatment.   [CF]  0722 The patient's urine drug screen is once again positive for both cocaine and opiates.  Urine Drug Screen, Qualitative ([place] only)(!) [CF]  0722 Likely secondary to cocaine use and noncompliance with dialysis although he has been going to emergency departments and hospitals frequently enough that he is almost on schedule with his dialysis.  Troponin I(!!): 0.09 [CF]  0722 Reassuring metabolic panel other than his chronic kidney disease, no hyperkalemia.  Comprehensive metabolic panel(!) [CF]    Clinical Course User Index [CF] Hinda Kehr, MD    ____________________________________________  FINAL CLINICAL IMPRESSION(S) / ED DIAGNOSES  Final diagnoses:  Shortness of breath  ESRD on hemodialysis ([place])  Noncompliance with renal dialysis [facility])     MEDICATIONS GIVEN DURING THIS VISIT:  Medications - No data to display   ED Discharge Orders    None       Note:  This document was prepared using Dragon voice recognition software and may include unintentional dictation errors.   Hinda Kehr,  MD 08/25/18 [phone]

## 2018-08-26 MED ORDER — ALBUTEROL SULFATE HFA 108 (90 BASE) MCG/ACT IN AERS: 2.0000 | INHALATION_SPRAY | Freq: Four times a day (QID) | RESPIRATORY_TRACT | 0 refills | Status: DC | PRN

## 2018-08-26 MED ORDER — PREDNISONE 50 MG PO TABS: 50.0000 mg | ORAL_TABLET | Freq: Every day | ORAL | 0 refills | Status: DC

## 2018-08-26 NOTE — Discharge Summary (Signed)
[place] [facility] [place] NAME: Orlie Cundari    MR#:  [id number]  DATE OF BIRTH:  09-May-1956  DATE OF ADMISSION:  08/25/2018   ADMITTING PHYSICIAN: Sela Hua, MD  DATE OF DISCHARGE: 08/26/2018 10:28 AM  PRIMARY CARE PHYSICIAN: Patient, No Pcp Per   ADMISSION DIAGNOSIS:  Shortness of breath [R06.02] Noncompliance with renal dialysis ([place]) [Z91.15] ESRD on hemodialysis ([place]) [N18.6, Z99.2] DISCHARGE DIAGNOSIS:  Active Problems:   Shortness of breath  SECONDARY DIAGNOSIS:   Past Medical History:  Diagnosis Date  . Depression   . Heart attack ([place])   . Hypertension   . PE (pulmonary embolism)   . Renal disorder    HOSPITAL COURSE:   Josaiah is a 63 year old male who presented to the ED with shortness of breath.  He has had multiple admissions and ED visits in the last 1 to 2 weeks for chest pain and shortness of breath.  He has been set up with outpatient dialysis, but has never been to the dialysis center.  In the ED, he was noted to be wheezing.  He was felt to have likely COPD exacerbation.  He was admitted for further management.  COPD exacerbation- improved -Patient remained on room air throughout admission -Shortness of breath markedly improved after steroids -Initially treated with IV Solu-Medrol and then transitioned to prednisone for a total 5-day course -Prescribed albuterol inhaler on discharge -Needs PFTs as an outpatient  ESRD on HD- receives HD TTS -Nephrology consult for HD -Patient should follow-up for outpatient dialysis on Tuesday  CAD- stable, no active chest pain. -Troponins flat and at baseline -Continued aspirin, plavix, and imdur -Discussed cessation from cocaine  Hypertension- BPs initially elevated, but improved after dialysis -Continued home norvasc, hydralazine, labetalol, terazosin  Chronic pain- stable -Continued home oxycontin and oxycodone  BPH- stable  -Continued home  flomax  Hyperlipidemia- stable -Continued home lipitor  Anemia of chronic kidney disease- hemoglobin at baseline -Monitor  Tobacco use- smokes half a pack per day -Nicotine patch  DISCHARGE CONDITIONS:  ESRD on HD CAD Hypertension Chronic pain BPH Hyperlipidemia Anemia of chronic kidney disease Tobacco use Cocaine use CONSULTS OBTAINED:  Nephrology DRUG ALLERGIES:   Allergies  Allergen Reactions  . Cyclobenzaprine Nausea Only  . Clonidine Other (See Comments)    Asymptomatic bradycardia to 38  . Furosemide Other (See Comments)    Caused renal failure  . Tylenol [Acetaminophen] Other (See Comments)    Reaction:  Pt states it bothers his liver   DISCHARGE MEDICATIONS:   Allergies as of 08/26/2018      Reactions   Cyclobenzaprine Nausea Only   Clonidine Other (See Comments)   Asymptomatic bradycardia to 38   Furosemide Other (See Comments)   Caused renal failure   Tylenol [acetaminophen] Other (See Comments)   Reaction:  Pt states it bothers his liver      Medication List    TAKE these medications   albuterol 108 (90 Base) MCG/ACT inhaler Commonly known as:  PROVENTIL HFA;VENTOLIN HFA Inhale 2 puffs into the lungs every 6 (six) hours as needed for wheezing or shortness of breath. Notes to patient:  As needed   amLODipine 10 MG tablet Commonly known as:  NORVASC Take 10 mg by mouth daily.   aspirin EC 81 MG tablet Take 81 mg by mouth daily.   atorvastatin 40 MG tablet Commonly known as:  LIPITOR Take 40 mg by mouth at bedtime.   clopidogrel 75 MG tablet  Commonly known as:  PLAVIX Take 75 mg by mouth daily.   hydrALAZINE 100 MG tablet Commonly known as:  APRESOLINE Take 1 tablet (100 mg total) by mouth 3 (three) times daily.   isosorbide mononitrate 60 MG 24 hr tablet Commonly known as:  IMDUR Take 1 tablet (60 mg total) by mouth daily.   labetalol 300 MG tablet Commonly known as:  NORMODYNE Take 1 tablet (300 mg total) by mouth 3 (three)  times daily.   oxyCODONE 20 mg 12 hr tablet Commonly known as:  OXYCONTIN Take 20 mg by mouth every 12 (twelve) hours.   Oxycodone HCl 10 MG Tabs Take 10-20 mg by mouth every 4 (four) hours as needed (for pain).   polyethylene glycol packet Commonly known as:  MIRALAX / GLYCOLAX Take 17 g by mouth daily as needed for mild constipation.   predniSONE 50 MG tablet Commonly known as:  DELTASONE Take 1 tablet (50 mg total) by mouth daily with breakfast.   senna-docusate 8.6-50 MG tablet Commonly known as:  Senokot-S Take 1 tablet by mouth 2 (two) times daily.   sevelamer carbonate 800 MG tablet Commonly known as:  RENVELA Take 2 tablets (1,600 mg total) by mouth 3 (three) times daily with meals.   sucralfate 1 g tablet Commonly known as:  CARAFATE Take 1 tablet (1 g total) by mouth 4 (four) times daily.   tamsulosin 0.4 MG Caps capsule Commonly known as:  FLOMAX Take 1 capsule (0.4 mg total) by mouth daily.   terazosin 5 MG capsule Commonly known as:  HYTRIN Take 1 capsule (5 mg total) by mouth at bedtime.   traZODone 100 MG tablet Commonly known as:  DESYREL Take 100 mg by mouth at bedtime as needed for sleep.        DISCHARGE INSTRUCTIONS:  1.  Follow-up with PCP in 5 days 2.  Follow-up for outpatient dialysis on Tuesday 3.  Take prednisone daily to complete a 5-day course of steroids 4.  Use albuterol every 6 hours as needed for shortness of breath or wheezing 5.  Needs PFTs as an outpatient DIET:  Cardiac diet and Renal diet DISCHARGE CONDITION:  Stable ACTIVITY:  Activity as tolerated OXYGEN:  Home Oxygen: No.  Oxygen Delivery: room air DISCHARGE LOCATION:  home   If you experience worsening of your admission symptoms, develop shortness of breath, life threatening emergency, suicidal or homicidal thoughts you must seek medical attention immediately by calling 911 or calling your MD immediately  if symptoms less severe.  You Must read complete  instructions/literature along with all the possible adverse reactions/side effects for all the Medicines you take and that have been prescribed to you. Take any new Medicines after you have completely understood and accpet all the possible adverse reactions/side effects.   Please note  You were cared for by a hospitalist during your hospital stay. If you have any questions about your discharge medications or the care you received while you were in the hospital after you are discharged, you can call the unit and asked to speak with the hospitalist on call if the hospitalist that took care of you is not available. Once you are discharged, your primary care physician will handle any further medical issues. Please note that NO REFILLS for any discharge medications will be authorized once you are discharged, as it is imperative that you return to your primary care physician (or establish a relationship with a primary care physician if you do not have one) for your  aftercare needs so that they can reassess your need for medications and monitor your lab values.    On the day of Discharge:  VITAL SIGNS:  Blood pressure 136/82, pulse 78, temperature 98.6 F (37 C), temperature source Oral, resp. rate 16, height 6' (1.829 m), weight 124.8 kg, SpO2 96 %. PHYSICAL EXAMINATION:  GENERAL:  63 y.o.-year-old patient lying in the bed with no acute distress.  EYES: Pupils equal, round, reactive to light and accommodation. No scleral icterus. Extraocular muscles intact.  HEENT: Head atraumatic, normocephalic. Oropharynx and nasopharynx clear.  NECK:  Supple, no jugular venous distention. No thyroid enlargement, no tenderness.  LUNGS: Normal breath sounds bilaterally, no wheezing, rales, rhonchi or crepitation. No use of accessory muscles of respiration.  CARDIOVASCULAR: RRR, S1, S2 normal. No murmurs, rubs, or gallops.  ABDOMEN: Soft, non-tender, non-distended. Bowel sounds present. No organomegaly or mass.    EXTREMITIES: No pedal edema, cyanosis, or clubbing.  NEUROLOGIC: Cranial nerves II through XII are intact. Muscle strength 5/5 in all extremities. Sensation intact. Gait not checked.  PSYCHIATRIC: The patient is alert and oriented x 3.  SKIN: No obvious rash, lesion, or ulcer.  DATA REVIEW:   CBC Recent Labs  Lab 08/25/18 1850  WBC 6.7  HGB 9.4*  HCT 28.4*  PLT 147*    Chemistries  Recent Labs  Lab 08/25/18 0526 08/25/18 1850  NA 139 137  K 4.2 5.0  CL 104 103  CO2 27 23  GLUCOSE 114* 166*  BUN 49* 58*  CREATININE 6.88* 7.06*  CALCIUM 8.7* 8.5*  AST 29  --   ALT 32  --   ALKPHOS 62  --   BILITOT 0.8  --      Microbiology Results  Results for orders placed or performed during the hospital encounter of 08/14/18  MRSA PCR Screening     Status: None   Collection Time: 08/15/18  1:12 AM  Result Value Ref Range Status   MRSA by PCR NEGATIVE NEGATIVE Final    Comment:        The GeneXpert MRSA Assay (FDA approved for NASAL specimens only), is one component of a comprehensive MRSA colonization surveillance program. It is not intended to diagnose MRSA infection nor to guide or monitor treatment for MRSA infections. Performed at [facility], [address] [place], [place] [address]     RADIOLOGY:  No results found.   Management plans discussed with the patient, family and they are in agreement.  CODE STATUS: Prior   TOTAL TIME TAKING CARE OF THIS PATIENT: 35 minutes.    Berna Spare Starleen Trussell M.D on 08/26/2018 at 2:26 PM  Between 7am to 6pm - Pager [phone]  After 6pm go to [email or web address] - Proofreader  [facility]  Office  [phone]  CC: Primary care physician; Patient, No Pcp Per   Note: This dictation was prepared with Dragon dictation along with smaller phrase technology. Any transcriptional errors that result from this process are unintentional.

## 2018-08-26 NOTE — Discharge Instructions (Signed)
You came into the hospital with shortness of breath. I think you have COPD, which is a lung disease caused by smoking. We gave you some steroids, which helped your breathing. I have prescribed some Prednisone for you to take at home. Please take Prednisone 50mg  daily with breakfast starting tomorrow. I have also prescribed an albuterol inhaler that you can use every 6 hours as needed for shortness of breath.  Please make sure you go to dialysis on Tuesday. Your dialysis center is [facility], located at [address], [place], [place].  Take care, Dr. Brett Albino

## 2018-08-26 NOTE — Progress Notes (Signed)
08/26/2018 10:27 AM  [name] to be D/C'd Home per MD order.  Discussed prescriptions and follow up appointments with the patient. Prescriptions given to patient, medication list explained in detail. Pt verbalized understanding.  Allergies as of 08/26/2018      Reactions   Cyclobenzaprine Nausea Only   Clonidine Other (See Comments)   Asymptomatic bradycardia to 38   Furosemide Other (See Comments)   Caused renal failure   Tylenol [acetaminophen] Other (See Comments)   Reaction:  Pt states it bothers his liver      Medication List    TAKE these medications   albuterol 108 (90 Base) MCG/ACT inhaler Commonly known as:  PROVENTIL HFA;VENTOLIN HFA Inhale 2 puffs into the lungs every 6 (six) hours as needed for wheezing or shortness of breath. Notes to patient:  As needed   amLODipine 10 MG tablet Commonly known as:  NORVASC Take 10 mg by mouth daily.   aspirin EC 81 MG tablet Take 81 mg by mouth daily.   atorvastatin 40 MG tablet Commonly known as:  LIPITOR Take 40 mg by mouth at bedtime.   clopidogrel 75 MG tablet Commonly known as:  PLAVIX Take 75 mg by mouth daily.   hydrALAZINE 100 MG tablet Commonly known as:  APRESOLINE Take 1 tablet (100 mg total) by mouth 3 (three) times daily.   isosorbide mononitrate 60 MG 24 hr tablet Commonly known as:  IMDUR Take 1 tablet (60 mg total) by mouth daily.   labetalol 300 MG tablet Commonly known as:  NORMODYNE Take 1 tablet (300 mg total) by mouth 3 (three) times daily.   oxyCODONE 20 mg 12 hr tablet Commonly known as:  OXYCONTIN Take 20 mg by mouth every 12 (twelve) hours.   Oxycodone HCl 10 MG Tabs Take 10-20 mg by mouth every 4 (four) hours as needed (for pain).   polyethylene glycol packet Commonly known as:  MIRALAX / GLYCOLAX Take 17 g by mouth daily as needed for mild constipation.   predniSONE 50 MG tablet Commonly known as:  DELTASONE Take 1 tablet (50 mg total) by mouth daily with breakfast.    senna-docusate 8.6-50 MG tablet Commonly known as:  Senokot-S Take 1 tablet by mouth 2 (two) times daily.   sevelamer carbonate 800 MG tablet Commonly known as:  RENVELA Take 2 tablets (1,600 mg total) by mouth 3 (three) times daily with meals.   sucralfate 1 g tablet Commonly known as:  CARAFATE Take 1 tablet (1 g total) by mouth 4 (four) times daily.   tamsulosin 0.4 MG Caps capsule Commonly known as:  FLOMAX Take 1 capsule (0.4 mg total) by mouth daily.   terazosin 5 MG capsule Commonly known as:  HYTRIN Take 1 capsule (5 mg total) by mouth at bedtime.   traZODone 100 MG tablet Commonly known as:  DESYREL Take 100 mg by mouth at bedtime as needed for sleep.       Vitals:   08/26/18 0111 08/26/18 0515  BP: (!) 168/92 136/82  Pulse: 79 78  Resp: 16 16  Temp: 98.3 F (36.8 C) 98.6 F (37 C)  SpO2: 100% 96%    Skin clean, dry and intact without evidence of skin break down, no evidence of skin tears noted. IV catheter discontinued intact. Site without signs and symptoms of complications. Dressing and pressure applied. Pt denies pain at this time. No complaints noted.  An After Visit Summary was printed and given to the patient. Patient escorted via [place], and  D/C home via private auto.  Dola Argyle

## 2018-08-27 ENCOUNTER — Encounter: Payer: Self-pay | Admitting: Emergency Medicine

## 2018-08-27 ENCOUNTER — Other Ambulatory Visit: Payer: Self-pay

## 2018-08-27 ENCOUNTER — Emergency Department: Payer: Medicare Other

## 2018-08-27 ENCOUNTER — Inpatient Hospital Stay
Admission: EM | Admit: 2018-08-27 | Discharge: 2018-08-30 | DRG: 193 | Disposition: A | Discharge status: Home / Self Care | Payer: Medicare Other | Attending: Family Medicine | Admitting: Family Medicine

## 2018-08-27 DIAGNOSIS — F17210 Nicotine dependence, cigarettes, uncomplicated: Secondary | ICD-10-CM | POA: Diagnosis present

## 2018-08-27 DIAGNOSIS — N400 Enlarged prostate without lower urinary tract symptoms: Secondary | ICD-10-CM | POA: Diagnosis present

## 2018-08-27 DIAGNOSIS — D631 Anemia in chronic kidney disease: Secondary | ICD-10-CM | POA: Diagnosis present

## 2018-08-27 DIAGNOSIS — Y95 Nosocomial condition: Secondary | ICD-10-CM | POA: Diagnosis present

## 2018-08-27 DIAGNOSIS — F329 Major depressive disorder, single episode, unspecified: Secondary | ICD-10-CM | POA: Diagnosis present

## 2018-08-27 DIAGNOSIS — J440 Chronic obstructive pulmonary disease with acute lower respiratory infection: Secondary | ICD-10-CM | POA: Diagnosis present

## 2018-08-27 DIAGNOSIS — Z91199 Patient's noncompliance with other medical treatment and regimen due to unspecified reason: Secondary | ICD-10-CM

## 2018-08-27 DIAGNOSIS — I2699 Other pulmonary embolism without acute cor pulmonale: Secondary | ICD-10-CM | POA: Diagnosis present

## 2018-08-27 DIAGNOSIS — Z888 Allergy status to other drugs, medicaments and biological substances status: Secondary | ICD-10-CM

## 2018-08-27 DIAGNOSIS — G894 Chronic pain syndrome: Secondary | ICD-10-CM | POA: Diagnosis present

## 2018-08-27 DIAGNOSIS — Z79899 Other long term (current) drug therapy: Secondary | ICD-10-CM

## 2018-08-27 DIAGNOSIS — Z992 Dependence on renal dialysis: Secondary | ICD-10-CM

## 2018-08-27 DIAGNOSIS — N2581 Secondary hyperparathyroidism of renal origin: Secondary | ICD-10-CM | POA: Diagnosis present

## 2018-08-27 DIAGNOSIS — Z8249 Family history of ischemic heart disease and other diseases of the circulatory system: Secondary | ICD-10-CM

## 2018-08-27 DIAGNOSIS — R079 Chest pain, unspecified: Secondary | ICD-10-CM

## 2018-08-27 DIAGNOSIS — I248 Other forms of acute ischemic heart disease: Secondary | ICD-10-CM | POA: Diagnosis present

## 2018-08-27 DIAGNOSIS — I252 Old myocardial infarction: Secondary | ICD-10-CM

## 2018-08-27 DIAGNOSIS — Z7902 Long term (current) use of antithrombotics/antiplatelets: Secondary | ICD-10-CM

## 2018-08-27 DIAGNOSIS — R0789 Other chest pain: Secondary | ICD-10-CM

## 2018-08-27 DIAGNOSIS — I120 Hypertensive chronic kidney disease with stage 5 chronic kidney disease or end stage renal disease: Secondary | ICD-10-CM | POA: Diagnosis present

## 2018-08-27 DIAGNOSIS — Z9119 Patient's noncompliance with other medical treatment and regimen: Secondary | ICD-10-CM

## 2018-08-27 DIAGNOSIS — Z7952 Long term (current) use of systemic steroids: Secondary | ICD-10-CM

## 2018-08-27 DIAGNOSIS — E785 Hyperlipidemia, unspecified: Secondary | ICD-10-CM | POA: Diagnosis present

## 2018-08-27 DIAGNOSIS — J441 Chronic obstructive pulmonary disease with (acute) exacerbation: Secondary | ICD-10-CM | POA: Diagnosis present

## 2018-08-27 DIAGNOSIS — Z9115 Patient's noncompliance with renal dialysis: Secondary | ICD-10-CM

## 2018-08-27 DIAGNOSIS — Z79891 Long term (current) use of opiate analgesic: Secondary | ICD-10-CM

## 2018-08-27 DIAGNOSIS — J189 Pneumonia, unspecified organism: Secondary | ICD-10-CM | POA: Diagnosis present

## 2018-08-27 DIAGNOSIS — N186 End stage renal disease: Secondary | ICD-10-CM

## 2018-08-27 DIAGNOSIS — Z86711 Personal history of pulmonary embolism: Secondary | ICD-10-CM

## 2018-08-27 DIAGNOSIS — F1410 Cocaine abuse, uncomplicated: Secondary | ICD-10-CM | POA: Diagnosis present

## 2018-08-27 DIAGNOSIS — J180 Bronchopneumonia, unspecified organism: Principal | ICD-10-CM | POA: Diagnosis present

## 2018-08-27 DIAGNOSIS — Z7982 Long term (current) use of aspirin: Secondary | ICD-10-CM

## 2018-08-27 DIAGNOSIS — I2510 Atherosclerotic heart disease of native coronary artery without angina pectoris: Secondary | ICD-10-CM | POA: Diagnosis present

## 2018-08-27 DIAGNOSIS — Z96649 Presence of unspecified artificial hip joint: Secondary | ICD-10-CM | POA: Diagnosis present

## 2018-08-27 DIAGNOSIS — D696 Thrombocytopenia, unspecified: Secondary | ICD-10-CM | POA: Diagnosis present

## 2018-08-27 LAB — CBC WITH DIFFERENTIAL/PLATELET
Abs Immature Granulocytes: 0.33 10*3/uL — ABNORMAL HIGH (ref 0.00–0.07)
Basophils Absolute: 0 10*3/uL (ref 0.0–0.1)
Basophils Relative: 0 %
Eosinophils Absolute: 0 10*3/uL (ref 0.0–0.5)
Eosinophils Relative: 0 %
HCT: 29.2 % — ABNORMAL LOW (ref 39.0–52.0)
Hemoglobin: 9.6 g/dL — ABNORMAL LOW (ref 13.0–17.0)
Immature Granulocytes: 3 %
Lymphocytes Relative: 9 %
Lymphs Abs: 1.2 10*3/uL (ref 0.7–4.0)
MCH: 28.5 pg (ref 26.0–34.0)
MCHC: 32.9 g/dL (ref 30.0–36.0)
MCV: 86.6 fL (ref 80.0–100.0)
MONOS PCT: 8 %
Monocytes Absolute: 1.1 10*3/uL — ABNORMAL HIGH (ref 0.1–1.0)
Neutro Abs: 10.2 10*3/uL — ABNORMAL HIGH (ref 1.7–7.7)
Neutrophils Relative %: 80 %
Platelets: 173 10*3/uL (ref 150–400)
RBC: 3.37 MIL/uL — ABNORMAL LOW (ref 4.22–5.81)
RDW: 14.8 % (ref 11.5–15.5)
Smear Review: NORMAL
WBC: 12.8 10*3/uL — ABNORMAL HIGH (ref 4.0–10.5)
nRBC: 0 % (ref 0.0–0.2)

## 2018-08-27 LAB — COMPREHENSIVE METABOLIC PANEL
ALBUMIN: 3.3 g/dL — AB (ref 3.5–5.0)
ALT: 41 U/L (ref 0–44)
AST: 28 U/L (ref 15–41)
Alkaline Phosphatase: 54 U/L (ref 38–126)
Anion gap: 10 (ref 5–15)
BUN: 66 mg/dL — ABNORMAL HIGH (ref 6–20)
CHLORIDE: 101 mmol/L (ref 98–111)
CO2: 25 mmol/L (ref 22–32)
CREATININE: 7.16 mg/dL — AB (ref 0.61–1.24)
Calcium: 8.7 mg/dL — ABNORMAL LOW (ref 8.9–10.3)
GFR calc Af Amer: 9 mL/min — ABNORMAL LOW (ref >60)
GFR calc non Af Amer: 8 mL/min — ABNORMAL LOW (ref >60)
Glucose, Bld: 115 mg/dL — ABNORMAL HIGH (ref 70–99)
Potassium: 4.6 mmol/L (ref 3.5–5.1)
SODIUM: 136 mmol/L (ref 135–145)
Total Bilirubin: 0.5 mg/dL (ref 0.3–1.2)
Total Protein: 6.8 g/dL (ref 6.5–8.1)

## 2018-08-27 LAB — TROPONIN I
Troponin I: 0.07 ng/mL (ref <0.03)
Troponin I: 0.07 ng/mL (ref <0.03)
Troponin I: 0.06 ng/mL (ref <0.03)

## 2018-08-27 LAB — ETHANOL: Alcohol, Ethyl (B): <10 mg/dL (ref <10)

## 2018-08-27 LAB — LIPASE, BLOOD: Lipase: 61 U/L — ABNORMAL HIGH (ref 11–51)

## 2018-08-27 LAB — FIBRIN DERIVATIVES D-DIMER (ARMC ONLY): FIBRIN DERIVATIVES D-DIMER (ARMC): 1401.72 ng{FEU}/mL — AB (ref 0.00–499.00)

## 2018-08-27 MED ORDER — TAMSULOSIN HCL 0.4 MG PO CAPS
0.4000 mg | ORAL_CAPSULE | Freq: Every day | ORAL | Status: DC
  Administered 2018-08-27 – 2018-08-30 (×4): 0.4 mg via ORAL
  Filled 2018-08-27 (×4): qty 1

## 2018-08-27 MED ORDER — POLYETHYLENE GLYCOL 3350 17 G PO PACK
17.0000 g | PACK | Freq: Every day | ORAL | Status: DC | PRN
  Administered 2018-08-28 – 2018-08-29 (×2): 17 g via ORAL
  Filled 2018-08-27 (×2): qty 1

## 2018-08-27 MED ORDER — OXYCODONE HCL ER 10 MG PO T12A
20.0000 mg | EXTENDED_RELEASE_TABLET | Freq: Two times a day (BID) | ORAL | Status: DC
  Administered 2018-08-27 – 2018-08-30 (×6): 20 mg via ORAL
  Filled 2018-08-27 (×6): qty 2

## 2018-08-27 MED ORDER — HEPARIN SODIUM (PORCINE) 5000 UNIT/ML IJ SOLN
5000.0000 [IU] | Freq: Three times a day (TID) | INTRAMUSCULAR | Status: DC
  Administered 2018-08-27 – 2018-08-30 (×8): 5000 [IU] via SUBCUTANEOUS
  Filled 2018-08-27 (×9): qty 1

## 2018-08-27 MED ORDER — TERAZOSIN HCL 5 MG PO CAPS
5.0000 mg | ORAL_CAPSULE | Freq: Every day | ORAL | Status: DC
  Administered 2018-08-27 – 2018-08-29 (×3): 5 mg via ORAL
  Filled 2018-08-27 (×4): qty 1

## 2018-08-27 MED ORDER — ALBUTEROL SULFATE (2.5 MG/3ML) 0.083% IN NEBU
2.5000 mg | INHALATION_SOLUTION | Freq: Four times a day (QID) | RESPIRATORY_TRACT | Status: DC | PRN
  Administered 2018-08-28 (×2): 2.5 mg via RESPIRATORY_TRACT
  Filled 2018-08-27 (×2): qty 3

## 2018-08-27 MED ORDER — NICOTINE 14 MG/24HR TD PT24
14.0000 mg | MEDICATED_PATCH | Freq: Every day | TRANSDERMAL | Status: DC
  Administered 2018-08-27 – 2018-08-30 (×4): 14 mg via TRANSDERMAL
  Filled 2018-08-27 (×5): qty 1

## 2018-08-27 MED ORDER — SENNOSIDES-DOCUSATE SODIUM 8.6-50 MG PO TABS
1.0000 | ORAL_TABLET | Freq: Two times a day (BID) | ORAL | Status: DC
  Administered 2018-08-27 – 2018-08-30 (×5): 1 via ORAL
  Filled 2018-08-27 (×5): qty 1

## 2018-08-27 MED ORDER — VANCOMYCIN HCL 10 G IV SOLR
2000.0000 mg | Freq: Once | INTRAVENOUS | Status: AC
  Administered 2018-08-27: 2000 mg via INTRAVENOUS
  Filled 2018-08-27: qty 2000

## 2018-08-27 MED ORDER — SODIUM CHLORIDE 0.9 % IV SOLN
500.0000 mg | Freq: Once | INTRAVENOUS | Status: AC
  Administered 2018-08-27: 500 mg via INTRAVENOUS
  Filled 2018-08-27: qty 500

## 2018-08-27 MED ORDER — IOHEXOL 350 MG/ML SOLN
75.0000 mL | Freq: Once | INTRAVENOUS | Status: AC | PRN
  Administered 2018-08-27: 75 mL via INTRAVENOUS

## 2018-08-27 MED ORDER — ACETAMINOPHEN 10 MG/ML IV SOLN
1000.0000 mg | Freq: Four times a day (QID) | INTRAVENOUS | Status: DC
  Administered 2018-08-28 (×2): 1000 mg via INTRAVENOUS
  Filled 2018-08-27 (×5): qty 100

## 2018-08-27 MED ORDER — LORAZEPAM 2 MG/ML IJ SOLN
0.5000 mg | Freq: Once | INTRAMUSCULAR | Status: AC
  Administered 2018-08-27: 0.5 mg via INTRAVENOUS
  Filled 2018-08-27: qty 1

## 2018-08-27 MED ORDER — ATORVASTATIN CALCIUM 20 MG PO TABS
40.0000 mg | ORAL_TABLET | Freq: Every day | ORAL | Status: DC
  Administered 2018-08-27 – 2018-08-29 (×3): 40 mg via ORAL
  Filled 2018-08-27 (×3): qty 2

## 2018-08-27 MED ORDER — CHLORHEXIDINE GLUCONATE CLOTH 2 % EX PADS
6.0000 | MEDICATED_PAD | Freq: Every day | CUTANEOUS | Status: DC
  Administered 2018-08-30: 6 via TOPICAL

## 2018-08-27 MED ORDER — HYDRALAZINE HCL 50 MG PO TABS
100.0000 mg | ORAL_TABLET | Freq: Three times a day (TID) | ORAL | Status: DC
  Administered 2018-08-27 – 2018-08-29 (×7): 100 mg via ORAL
  Filled 2018-08-27 (×7): qty 2

## 2018-08-27 MED ORDER — TRAZODONE HCL 100 MG PO TABS
100.0000 mg | ORAL_TABLET | Freq: Every evening | ORAL | Status: DC | PRN
  Filled 2018-08-27: qty 1

## 2018-08-27 MED ORDER — ISOSORBIDE MONONITRATE ER 30 MG PO TB24
60.0000 mg | ORAL_TABLET | Freq: Every day | ORAL | Status: DC
  Administered 2018-08-27 – 2018-08-30 (×4): 60 mg via ORAL
  Filled 2018-08-27 (×4): qty 2

## 2018-08-27 MED ORDER — SODIUM CHLORIDE 0.9 % IV SOLN
1.0000 g | Freq: Once | INTRAVENOUS | Status: AC
  Administered 2018-08-27: 1 g via INTRAVENOUS
  Filled 2018-08-27: qty 1

## 2018-08-27 MED ORDER — DEXTROSE 5 % IV SOLN
500.0000 mg | INTRAVENOUS | Status: DC
  Administered 2018-08-28 – 2018-08-29 (×2): 500 mg via INTRAVENOUS
  Filled 2018-08-27 (×4): qty 0.5

## 2018-08-27 MED ORDER — OXYCODONE HCL 5 MG PO TABS
10.0000 mg | ORAL_TABLET | ORAL | Status: DC | PRN
  Administered 2018-08-27: 10 mg via ORAL
  Administered 2018-08-27: 20 mg via ORAL
  Administered 2018-08-27: 10 mg via ORAL
  Administered 2018-08-28 – 2018-08-29 (×7): 20 mg via ORAL
  Administered 2018-08-29: 15 mg via ORAL
  Administered 2018-08-29 – 2018-08-30 (×5): 20 mg via ORAL
  Filled 2018-08-27 (×9): qty 4
  Filled 2018-08-27: qty 3
  Filled 2018-08-27: qty 4
  Filled 2018-08-27: qty 2
  Filled 2018-08-27: qty 4
  Filled 2018-08-27: qty 2
  Filled 2018-08-27 (×2): qty 4

## 2018-08-27 MED ORDER — SODIUM CHLORIDE 0.9 % IV SOLN: 1.0000 g | Freq: Three times a day (TID) | INTRAVENOUS | Status: DC

## 2018-08-27 MED ORDER — CLOPIDOGREL BISULFATE 75 MG PO TABS
75.0000 mg | ORAL_TABLET | Freq: Every day | ORAL | Status: DC
  Administered 2018-08-27 – 2018-08-30 (×4): 75 mg via ORAL
  Filled 2018-08-27 (×4): qty 1

## 2018-08-27 MED ORDER — LABETALOL HCL 100 MG PO TABS
300.0000 mg | ORAL_TABLET | Freq: Three times a day (TID) | ORAL | Status: DC
  Administered 2018-08-27 – 2018-08-30 (×8): 300 mg via ORAL
  Filled 2018-08-27 (×10): qty 3

## 2018-08-27 MED ORDER — SEVELAMER CARBONATE 800 MG PO TABS
1600.0000 mg | ORAL_TABLET | Freq: Three times a day (TID) | ORAL | Status: DC
  Administered 2018-08-28 – 2018-08-30 (×5): 1600 mg via ORAL
  Filled 2018-08-27 (×7): qty 2

## 2018-08-27 MED ORDER — SODIUM CHLORIDE 0.9 % IV SOLN
1.0000 g | Freq: Once | INTRAVENOUS | Status: AC
  Administered 2018-08-27: 1 g via INTRAVENOUS
  Filled 2018-08-27: qty 10

## 2018-08-27 MED ORDER — IPRATROPIUM-ALBUTEROL 0.5-2.5 (3) MG/3ML IN SOLN
3.0000 mL | Freq: Once | RESPIRATORY_TRACT | Status: AC
  Administered 2018-08-27: 3 mL via RESPIRATORY_TRACT
  Filled 2018-08-27: qty 3

## 2018-08-27 MED ORDER — AMLODIPINE BESYLATE 10 MG PO TABS
10.0000 mg | ORAL_TABLET | Freq: Every day | ORAL | Status: DC
  Administered 2018-08-27 – 2018-08-30 (×4): 10 mg via ORAL
  Filled 2018-08-27 (×4): qty 1

## 2018-08-27 MED ORDER — VANCOMYCIN HCL IN DEXTROSE 1-5 GM/200ML-% IV SOLN
1000.0000 mg | INTRAVENOUS | Status: DC
  Administered 2018-08-28: 1000 mg via INTRAVENOUS
  Filled 2018-08-27: qty 200

## 2018-08-27 MED ORDER — SUCRALFATE 1 G PO TABS
1.0000 g | ORAL_TABLET | Freq: Four times a day (QID) | ORAL | Status: DC
  Administered 2018-08-27 – 2018-08-30 (×11): 1 g via ORAL
  Filled 2018-08-27 (×11): qty 1

## 2018-08-27 MED ORDER — HYDRALAZINE HCL 20 MG/ML IJ SOLN: 10.0000 mg | INTRAMUSCULAR | Status: DC | PRN

## 2018-08-27 MED ORDER — ASPIRIN EC 81 MG PO TBEC
81.0000 mg | DELAYED_RELEASE_TABLET | Freq: Every day | ORAL | Status: DC
  Administered 2018-08-27 – 2018-08-30 (×4): 81 mg via ORAL
  Filled 2018-08-27 (×4): qty 1

## 2018-08-27 MED ORDER — PREDNISONE 50 MG PO TABS
50.0000 mg | ORAL_TABLET | Freq: Every day | ORAL | Status: DC
  Administered 2018-08-28 – 2018-08-30 (×3): 50 mg via ORAL
  Filled 2018-08-27 (×3): qty 1

## 2018-08-27 NOTE — H&P (Signed)
[facility] NAME: [name]    MR#:  [id number]  DATE OF BIRTH:  [birth year]  DATE OF ADMISSION:  08/27/2018  PRIMARY CARE PHYSICIAN: Patient, No Pcp Per   REQUESTING/REFERRING PHYSICIAN:   CHIEF COMPLAINT:   Chief Complaint  Patient presents with  . Chest Pain    HISTORY OF PRESENT ILLNESS: [name]  is a 63 y.o. male with a known history per below which includes noncompliance with medical management was discharged from the hospital 2 days ago for COPD exacerbation, did receive hemodialysis at that time for end-stage renal disease, presents again with chest pain, in the emergency room patient was found to have elevated d-dimer, white count 12,000-on steroids, CT chest noted for left-sided pneumonia, hospitalist asked to admit for further care, patient currently in no apparent distress, resting comfortably in bed, patient is now been admitted for acute left HCAP, acute on chronic elevated troponins most likely secondary to demand ischemia/end-stage renal disease, and acute on chronic pain syndrome.  PAST MEDICAL HISTORY:   Past Medical History:  Diagnosis Date  . Depression   . Heart attack ([place])   . Hypertension   . PE (pulmonary embolism)   . Renal disorder     PAST SURGICAL HISTORY:  Past Surgical History:  Procedure Laterality Date  . TOTAL HIP ARTHROPLASTY      SOCIAL HISTORY:  Social History   Tobacco Use  . Smoking status: Current Every Day Smoker    Packs/day: 0.50    Types: Cigarettes  . Smokeless tobacco: Never Used  Substance Use Topics  . Alcohol use: No    FAMILY HISTORY:  Hypertension  DRUG ALLERGIES:  Allergies  Allergen Reactions  . Cyclobenzaprine Nausea Only  . Clonidine Other (See Comments)    Asymptomatic bradycardia to 38  . Furosemide Other (See Comments)    Caused renal failure  . Tylenol [Acetaminophen] Other (See Comments)    Reaction:  Pt states it bothers his liver     REVIEW OF SYSTEMS:   CONSTITUTIONAL: No fever, fatigue or weakness.  EYES: No blurred or double vision.  EARS, NOSE, AND THROAT: No tinnitus or ear pain.  RESPIRATORY: No cough, shortness of breath, wheezing or hemoptysis.  CARDIOVASCULAR: +chest pain, no orthopnea, edema.  GASTROINTESTINAL: No nausea, vomiting, diarrhea or abdominal pain.  GENITOURINARY: No dysuria, hematuria.  ENDOCRINE: No polyuria, nocturia,  HEMATOLOGY: No anemia, easy bruising or bleeding SKIN: No rash or lesion. MUSCULOSKELETAL: No joint pain or arthritis.   NEUROLOGIC: No tingling, numbness, weakness.  PSYCHIATRY: No anxiety or depression.   MEDICATIONS AT HOME:  Prior to Admission medications   Medication Sig Start Date End Date Taking? Authorizing Provider  albuterol (PROVENTIL HFA;VENTOLIN HFA) 108 (90 Base) MCG/ACT inhaler Inhale 2 puffs into the lungs every 6 (six) hours as needed for wheezing or shortness of breath. 08/26/18  Yes Mayo, Pete Pelt, MD  amLODipine (NORVASC) 10 MG tablet Take 10 mg by mouth daily. 11/27/15  Yes [provider]  aspirin EC 81 MG tablet Take 81 mg by mouth daily.   Yes [provider]  atorvastatin (LIPITOR) 40 MG tablet Take 40 mg by mouth at bedtime.  11/27/15  Yes [provider]  clopidogrel (PLAVIX) 75 MG tablet Take 75 mg by mouth daily. 06/21/16  Yes [provider]  hydrALAZINE (APRESOLINE) 100 MG tablet Take 1 tablet (100 mg total) by mouth 3 (three) times daily. 09/27/16  Yes Gladstone Lighter, MD  isosorbide mononitrate (IMDUR) 60 MG 24 hr tablet Take 1 tablet (60 mg total) by mouth daily. 09/28/16  Yes Gladstone Lighter, MD  labetalol (NORMODYNE) 300 MG tablet Take 1 tablet (300 mg total) by mouth 3 (three) times daily. 09/27/16  Yes Gladstone Lighter, MD  oxyCODONE (OXYCONTIN) 20 mg 12 hr tablet Take 20 mg by mouth every 12 (twelve) hours. 04/11/16  Yes [provider]  polyethylene glycol (MIRALAX / GLYCOLAX) packet Take  17 g by mouth daily as needed for mild constipation.  11/27/15  Yes [provider]  predniSONE (DELTASONE) 50 MG tablet Take 1 tablet (50 mg total) by mouth daily with breakfast. 08/26/18  Yes Mayo, Pete Pelt, MD  senna-docusate (SENOKOT-S) 8.6-50 MG tablet Take 1 tablet by mouth 2 (two) times daily. 09/27/16  Yes Gladstone Lighter, MD  sevelamer carbonate (RENVELA) 800 MG tablet Take 2 tablets (1,600 mg total) by mouth 3 (three) times daily with meals. 08/15/18  Yes Mayo, Pete Pelt, MD  sucralfate (CARAFATE) 1 g tablet Take 1 tablet (1 g total) by mouth 4 (four) times daily. 05/29/16  Yes Carrie Mew, MD  tamsulosin (FLOMAX) 0.4 MG CAPS capsule Take 1 capsule (0.4 mg total) by mouth daily. 09/27/16  Yes Gladstone Lighter, MD  terazosin (HYTRIN) 5 MG capsule Take 1 capsule (5 mg total) by mouth at bedtime. 09/27/16  Yes Gladstone Lighter, MD  traZODone (DESYREL) 100 MG tablet Take 100 mg by mouth at bedtime as needed for sleep.  10/22/15  Yes [provider]  Oxycodone HCl 10 MG TABS Take 10-20 mg by mouth every 4 (four) hours as needed (for pain).  04/11/16   [provider]      PHYSICAL EXAMINATION:   VITAL SIGNS: Blood pressure (!) 152/97, pulse 76, resp. rate 18, height 6' (1.829 m), weight 124.8 kg, SpO2 100 %.  GENERAL:  63 y.o.-year-old patient lying in the bed with no acute distress.  EYES: Pupils equal, round, reactive to light and accommodation. No scleral icterus. Extraocular muscles intact.  HEENT: Head atraumatic, normocephalic. Oropharynx and nasopharynx clear.  NECK:  Supple, no jugular venous distention. No thyroid enlargement, no tenderness.  LUNGS: Normal breath sounds bilaterally, no wheezing, rales,rhonchi or crepitation. No use of accessory muscles of respiration.  CARDIOVASCULAR: S1, S2 normal. No murmurs, rubs, or gallops.  ABDOMEN: Soft, nontender, nondistended. Bowel sounds present. No organomegaly or mass.  EXTREMITIES: No pedal edema,  cyanosis, or clubbing.  NEUROLOGIC: Cranial nerves II through XII are intact. Muscle strength 5/5 in all extremities. Sensation intact. Gait not checked.  PSYCHIATRIC: The patient is alert and oriented x 3.  SKIN: No obvious rash, lesion, or ulcer.   LABORATORY PANEL:   CBC Recent Labs  Lab 08/25/18 0526 08/25/18 1850 08/27/18 0439  WBC 7.2 6.7 12.8*  HGB 9.7* 9.4* 9.6*  HCT 30.1* 28.4* 29.2*  PLT 152 147* 173  MCV 88.8 87.7 86.6  MCH 28.6 29.0 28.5  MCHC 32.2 33.1 32.9  RDW 15.0 14.9 14.8  LYMPHSABS 1.0  --  1.2  MONOABS 0.7  --  1.1*  EOSABS 0.1  --  0.0  BASOSABS 0.0  --  0.0   ------------------------------------------------------------------------------------------------------------------  Chemistries  Recent Labs  Lab 08/25/18 0526 08/25/18 1850 08/27/18 0439  NA 139 137 136  K 4.2 5.0 4.6  CL 104 103 101  CO2 27 23 25   GLUCOSE 114* 166* 115*  BUN 49* 58* 66*  CREATININE 6.88* 7.06* 7.16*  CALCIUM 8.7* 8.5* 8.7*  AST 29  --  28  ALT 32  --  41  ALKPHOS 62  --  54  BILITOT 0.8  --  0.5   ------------------------------------------------------------------------------------------------------------------ estimated creatinine clearance is 15 mL/min (A) (by C-G formula based on SCr of 7.16 mg/dL (H)). ------------------------------------------------------------------------------------------------------------------ No results for input(s): TSH, T4TOTAL, T3FREE, THYROIDAB in the last 72 hours.  Invalid input(s): FREET3   Coagulation profile No results for input(s): INR, PROTIME in the last 168 hours. ------------------------------------------------------------------------------------------------------------------- No results for input(s): DDIMER in the last 72 hours. -------------------------------------------------------------------------------------------------------------------  Cardiac Enzymes Recent Labs  Lab 08/25/18 1345 08/27/18 0439  08/27/18 0745  TROPONINI 0.08* 0.07* 0.07*   ------------------------------------------------------------------------------------------------------------------ Invalid input(s): POCBNP  ---------------------------------------------------------------------------------------------------------------  Urinalysis    Component Value Date/Time   COLORURINE YELLOW (A) 08/20/2018 0230   APPEARANCEUR CLOUDY (A) 08/20/2018 0230   APPEARANCEUR Clear 11/08/2014 1559   LABSPEC 1.010 08/20/2018 0230   LABSPEC 1.010 11/08/2014 1559   PHURINE 9.0 (H) 08/20/2018 0230   GLUCOSEU NEGATIVE 08/20/2018 0230   GLUCOSEU Negative 11/08/2014 1559   HGBUR NEGATIVE 08/20/2018 0230   BILIRUBINUR NEGATIVE 08/20/2018 0230   BILIRUBINUR Negative 11/08/2014 1559   KETONESUR NEGATIVE 08/20/2018 0230   PROTEINUR 100 (A) 08/20/2018 0230   UROBILINOGEN 1.0 10/28/2010 1100   NITRITE POSITIVE (A) 08/20/2018 0230   LEUKOCYTESUR LARGE (A) 08/20/2018 0230   LEUKOCYTESUR Negative 11/08/2014 1559     RADIOLOGY: Ct Angio Chest Pe W And/or Wo Contrast  Result Date: 08/27/2018 CLINICAL DATA:  Acute presentation with chest pain. History of hypertension and pulmonary emboli. Dialysis patient. EXAM: CT ANGIOGRAPHY CHEST WITH CONTRAST TECHNIQUE: Multidetector CT imaging of the chest was performed using the standard protocol during bolus administration of intravenous contrast. Multiplanar CT image reconstructions and MIPs were obtained to evaluate the vascular anatomy. CONTRAST:  55mL OMNIPAQUE IOHEXOL 350 MG/ML SOLN COMPARISON:  Chest radiography same day.  Chest CT 08/14/2018. FINDINGS: Cardiovascular: Pulmonary arterial opacification is moderate. No pulmonary emboli are seen. There is aortic atherosclerosis but no aneurysm or dissection. There is some coronary artery calcification. Heart size is mildly enlarged. No pericardial fluid. Mediastinum/Nodes: No mass or lymphadenopathy. Normal mediastinal lymph nodes. Lungs/Pleura:  Patchy perihilar infiltrate on the left consistent with left lower lobe superior segment bronchopneumonia. Chronic lung cyst at the left base laterally. No pleural fluid. Upper Abdomen: Negative Musculoskeletal: Ordinary thoracic degenerative changes. No acute finding. Review of the MIP images confirms the above findings. IMPRESSION: No pulmonary emboli. Bronchopneumonia in the superior segment of the left lower lobe. Cardiomegaly. Aortic atherosclerosis. Coronary artery calcification. Electronically Signed   By: Nelson Chimes M.D.   On: 08/27/2018 11:09   Dg Chest Port 1 View  Result Date: 08/27/2018 CLINICAL DATA:  Chest pain EXAM: PORTABLE CHEST 1 VIEW COMPARISON:  Two days ago FINDINGS: Chronic cardiomegaly and vascular pedicle widening. There is no edema, consolidation, effusion, or pneumothorax. Negative for fracture IMPRESSION: 1. No acute finding. 2. Cardiomegaly. Electronically Signed   By: Monte Fantasia M.D.   On: 08/27/2018 04:56    EKG: Orders placed or performed during the hospital encounter of 08/27/18  . EKG 12-Lead  . EKG 12-Lead    IMPRESSION AND PLAN: [name] is a 63 year old male with history of end-stage renal disease, depression, coronary artery disease/MI, hypertension, pulmonary embolism, cocaine abuse, noncompliance of medical management, frequent admissions to the hospital, discharged 2 days ago after COPD exacerbation management, returns today with chest pain    *Acute  HCAP For to the observation unit, pneumonia protocol, empiric cefepime/vancomycin, follow-up on cultures  *Acute COPD  exacerbation Discharge from the hospital 2 days ago for this malady Resolving Continue prednisone taper, aggressive pulmonary toilet with bronchodilator therapy, supplemental oxygen wean as tolerated, mucolytic agents, continue close medical monitoring  *Chronic ESRD on HD receives HD TTS Nephrology consulted for HD needs  *Acute on chronic elevated troponins with CAD/MI  Most  likely secondary to demand ischemia and chronic end-stage renal disease  Continued aspirin, plavix, and imdur Patient encouraged to stop using cocaine  *Acute on chronic cocaine abuse Cessation recommended  *Chronic hypertension Continue home regiment of norvasc, hydralazine, labetalol, terazosin  *Acute on chronic pain syndrome  Stable  Continue home oxycontin and oxycodone  *Chronic BPH stable Continue home flomax  *Chronic hyperlipidemia stable Continue lipitor  *Anemia of chronic kidney disease Stable  *Chronic tobacco smoker abuse/dependency  Nicotine patch and cessation counseling ordered   All the records are reviewed and case discussed with ED provider. Management plans discussed with the patient, family and they are in agreement.  CODE STATUS:full Code Status History    Date Active Date Inactive Code Status Order ID Comments User Context   08/25/2018 0802 08/26/2018 1333 Full Code [id number]  Sela Hua, MD ED   08/20/2018 0400 08/21/2018 1823 Full Code [id number]  Harrie Foreman, MD Inpatient   08/15/2018 0058 08/18/2018 1624 Full Code [id number]  Lance Coon, MD Inpatient   09/21/2016 1519 09/27/2016 2042 Full Code [id number]  Hillary Bow, MD ED   04/28/2016 2325 04/29/2016 2043 Full Code [id number]  Muthersbaugh, Gwenlyn Perking ED   01/18/2016 0202 01/18/2016 1848 Full Code [id number]  Quintella Baton, MD Inpatient       TOTAL TIME TAKING CARE OF THIS PATIENT: 40 minutes.    Avel Peace Salary M.D on 08/27/2018   Between 7am to 6pm - Pager - [phone]  After 6pm go to [email or web address] - password [email or web address] [facility]  Office  [phone]  CC: Primary care physician; Patient, No Pcp Per   Note: This dictation was prepared with Dragon dictation along with smaller phrase technology. Any transcriptional errors that result from this process are unintentional.

## 2018-08-27 NOTE — ED Provider Notes (Signed)
Patient complaining of a lot of chest pain.  His chest wall pain on exam.  Troponin is not gone up.  However his d-dimer is very high.  A CT done.  CT shows a bronchopneumonia.  Patient seems somnolent when he wakes up he wants his pain meds.  He is getting some IV Tylenol as he does not seem to have any sign of liver damage.  He will have to get dialysis for the IV contrast and then as he is very noncompliant I think the safest thing to do with this gentleman would be to admit him for day or 2 for antibiotic therapy.   Nena Polio, MD 08/27/18 1154

## 2018-08-27 NOTE — Consult Note (Signed)
Pharmacy Antibiotic Note  [name] is a 63 y.o. male admitted on 08/27/2018 with pneumonia.  Pharmacy has been consulted for vancomycin dosing. The patient is ESRD on HD Tu/Th/Sa. He was recently discharged 2 days ago following a COPD exacerbation.  Plan: Vancomycin 1000 mg IV with every HD.  Target pre-HD level 15-25 mcg/mL.  Height: 6' (182.9 cm) Weight: 275 lb 2.2 oz (124.8 kg) IBW/kg (Calculated) : 77.6  No data recorded.  Recent Labs  Lab 08/25/18 0526 08/25/18 1850 08/27/18 0439  WBC 7.2 6.7 12.8*  CREATININE 6.88* 7.06* 7.16*    Estimated Creatinine Clearance: 15 mL/min (A) (by C-G formula based on SCr of 7.16 mg/dL (H)).    Antimicrobials this admission: vancomycin 2/17 >>  cefepime 2/17 >>   Microbiology results: 2/17 BCx: pending 2/17 Sputum: pending  2/18 MRSA PCR: pending  Thank you for allowing pharmacy to be a part of this patient's care.  Dallie Piles, PharmD 08/27/2018 12:33 PM

## 2018-08-27 NOTE — ED Notes (Signed)
Pt using call bell; says he can't get comfortable; repositioned in the bed; pt's eyes heavy; encouraged him to try to relax and let the Ativan work for him;

## 2018-08-27 NOTE — ED Provider Notes (Addendum)
[facility] Emergency Department Provider Note   ____________________________________________   First MD Initiated Contact with Patient 08/27/18 [phone]     (approximate)  I have reviewed the triage vital signs and the nursing notes.   HISTORY  Chief Complaint Chest Pain    HPI [name] is a 63 y.o. male brought to the ED from home via EMS with a chief complaint of chest pain.  Patient has a history of hypertension, history of PE, ESRD who is noncompliant with dialysis.  Also cocaine abuse.   He was most recently hospitalized from 2/15 to 2/17 for shortness of breath.  He was dialyzed and discharged yesterday at 10:30 AM.  Instructed to go for his next dialysis Tuesday, 2/18.  States he began to experience chest pain around midnight.  Symptoms associated with shortness of breath.  [place] associated fever, chills, abdominal pain, nausea, vomiting, diarrhea.  Denies recent travel or trauma.   Past Medical History:  Diagnosis Date  . Depression   . Heart attack ([place])   . Hypertension   . PE (pulmonary embolism)   . Renal disorder     Patient Active Problem List   Diagnosis Date Noted  . Shortness of breath 08/25/2018  . Chest pain 08/20/2018  . ESRD on dialysis ([place]) 11/08/2016  . Prostate cancer screening 11/08/2016  . Acquired cyst of kidney 11/08/2016  . Urinary urgency 11/08/2016  . Acute on chronic renal failure ([place]) 09/23/2016  . Hypertensive urgency 09/21/2016  . Dysthymia 02/26/2016  . Chronic pain 02/26/2016  . Noncompliance 02/26/2016  . Opiate abuse, continuous ([place]) 02/03/2016  . Substance induced mood disorder ([place]) 02/03/2016  . Antisocial personality disorder ([place]) 02/03/2016  . Left-sided weakness 01/18/2016  . HEPATITIS C 06/28/2008  . HLD (hyperlipidemia) 06/28/2008  . SUBSTANCE ABUSE, MULTIPLE 06/28/2008  . Hypertension 06/27/2008    Past Surgical History:  Procedure Laterality Date  . TOTAL HIP ARTHROPLASTY       Prior to Admission medications   Medication Sig Start Date End Date Taking? Authorizing Provider  albuterol (PROVENTIL HFA;VENTOLIN HFA) 108 (90 Base) MCG/ACT inhaler Inhale 2 puffs into the lungs every 6 (six) hours as needed for wheezing or shortness of breath. 08/26/18   Mayo, Pete Pelt, MD  amLODipine (NORVASC) 10 MG tablet Take 10 mg by mouth daily. 11/27/15   [provider]  aspirin EC 81 MG tablet Take 81 mg by mouth daily.    [provider]  atorvastatin (LIPITOR) 40 MG tablet Take 40 mg by mouth at bedtime.  11/27/15   [provider]  clopidogrel (PLAVIX) 75 MG tablet Take 75 mg by mouth daily. 06/21/16   [provider]  hydrALAZINE (APRESOLINE) 100 MG tablet Take 1 tablet (100 mg total) by mouth 3 (three) times daily. 09/27/16   Gladstone Lighter, MD  isosorbide mononitrate (IMDUR) 60 MG 24 hr tablet Take 1 tablet (60 mg total) by mouth daily. 09/28/16   Gladstone Lighter, MD  labetalol (NORMODYNE) 300 MG tablet Take 1 tablet (300 mg total) by mouth 3 (three) times daily. 09/27/16   Gladstone Lighter, MD  oxyCODONE (OXYCONTIN) 20 mg 12 hr tablet Take 20 mg by mouth every 12 (twelve) hours. 04/11/16   [provider]  Oxycodone HCl 10 MG TABS Take 10-20 mg by mouth every 4 (four) hours as needed (for pain).  04/11/16   [provider]  polyethylene glycol (MIRALAX / GLYCOLAX) packet Take 17 g by mouth daily as needed for mild  constipation.  11/27/15   [provider]  predniSONE (DELTASONE) 50 MG tablet Take 1 tablet (50 mg total) by mouth daily with breakfast. 08/26/18   Mayo, Pete Pelt, MD  senna-docusate (SENOKOT-S) 8.6-50 MG tablet Take 1 tablet by mouth 2 (two) times daily. 09/27/16   Gladstone Lighter, MD  sevelamer carbonate (RENVELA) 800 MG tablet Take 2 tablets (1,600 mg total) by mouth 3 (three) times daily with meals. 08/15/18   Mayo, Pete Pelt, MD  sucralfate (CARAFATE) 1 g tablet Take 1 tablet (1 g total) by mouth 4  (four) times daily. 05/29/16   Carrie Mew, MD  tamsulosin (FLOMAX) 0.4 MG CAPS capsule Take 1 capsule (0.4 mg total) by mouth daily. 09/27/16   Gladstone Lighter, MD  terazosin (HYTRIN) 5 MG capsule Take 1 capsule (5 mg total) by mouth at bedtime. 09/27/16   Gladstone Lighter, MD  traZODone (DESYREL) 100 MG tablet Take 100 mg by mouth at bedtime as needed for sleep.  10/22/15   [provider]    Allergies Cyclobenzaprine; Clonidine; Furosemide; and Tylenol [acetaminophen]  History reviewed. No pertinent family history.  Social History Social History   Tobacco Use  . Smoking status: Current Every Day Smoker    Packs/day: 0.50    Types: Cigarettes  . Smokeless tobacco: Never Used  Substance Use Topics  . Alcohol use: No  . Drug use: No    Comment: pt has been positive for cocaine twice this week but denies use    Review of Systems  Constitutional: No fever/chills Eyes: No visual changes. ENT: No sore throat. Cardiovascular: Positive for chest pain. Respiratory: Positive for shortness of breath. Gastrointestinal: No abdominal pain.  No nausea, no vomiting.  No diarrhea.  No constipation. Genitourinary: Negative for dysuria. Musculoskeletal: Negative for back pain. Skin: Negative for rash. Neurological: Negative for headaches, focal weakness or numbness.   ____________________________________________   PHYSICAL EXAM:  VITAL SIGNS: ED Triage Vitals  Enc Vitals Group     BP      Pulse      Resp      Temp      Temp src      SpO2      Weight      Height      Head Circumference      Peak Flow      Pain Score      Pain Loc      Pain Edu?      Excl. in [place]?     Constitutional: Alert and oriented.  Chronically ill appearing and in mild acute distress. Eyes: Conjunctivae are normal. PERRL. EOMI. Head: Atraumatic. Nose: No congestion/rhinnorhea. Mouth/Throat: Mucous membranes are moist.  Oropharynx non-erythematous. Neck: No stridor.     Cardiovascular: Normal rate, regular rhythm. Grossly normal heart sounds.  Good peripheral circulation. Respiratory: Normal respiratory effort.  No retractions. Lungs CTAB. Gastrointestinal: Soft and nontender. No distention. No abdominal bruits. No CVA tenderness. Musculoskeletal: No lower extremity tenderness nor edema.  No joint effusions. Neurologic:  Normal speech and language. No gross focal neurologic deficits are appreciated.  Skin:  Skin is warm, dry and intact. No rash noted. Psychiatric: Mood and affect are normal. Speech and behavior are normal.  ____________________________________________   LABS (all labs ordered are listed, but only abnormal results are displayed)  Labs Reviewed  CBC WITH DIFFERENTIAL/PLATELET - Abnormal; Notable for the following components:      Result Value   WBC 12.8 (*)    RBC 3.37 (*)  Hemoglobin 9.6 (*)    HCT 29.2 (*)    Neutro Abs 10.2 (*)    Monocytes Absolute 1.1 (*)    Abs Immature Granulocytes 0.33 (*)    All other components within normal limits  COMPREHENSIVE METABOLIC PANEL - Abnormal; Notable for the following components:   Glucose, Bld 115 (*)    BUN 66 (*)    Creatinine, Ser 7.16 (*)    Calcium 8.7 (*)    Albumin 3.3 (*)    GFR calc non Af Amer 8 (*)    GFR calc Af Amer 9 (*)    All other components within normal limits  TROPONIN I - Abnormal; Notable for the following components:   Troponin I 0.07 (*)    All other components within normal limits  LIPASE, BLOOD - Abnormal; Notable for the following components:   Lipase 61 (*)    All other components within normal limits  ETHANOL  TROPONIN I   ____________________________________________  EKG  ED ECG REPORT I, Kindell Strada J, the attending physician, personally viewed and interpreted this ECG.   Date: 08/27/2018  EKG Time: 0429  Rate: 72  Rhythm: normal EKG, normal sinus rhythm  Axis: Normal  Intervals:none  ST&T Change: Nonspecific No change from  08/25/2018 ____________________________________________  RADIOLOGY  ED MD interpretation: No acute cardiopulmonary process, cardiomegaly  Official radiology report(s): Dg Chest Port 1 View  Result Date: 08/27/2018 CLINICAL DATA:  Chest pain EXAM: PORTABLE CHEST 1 VIEW COMPARISON:  Two days ago FINDINGS: Chronic cardiomegaly and vascular pedicle widening. There is no edema, consolidation, effusion, or pneumothorax. Negative for fracture IMPRESSION: 1. No acute finding. 2. Cardiomegaly. Electronically Signed   By: Monte Fantasia M.D.   On: 08/27/2018 04:56    ____________________________________________   PROCEDURES  Procedure(s) performed: None  Procedures  Critical Care performed:   CRITICAL CARE Performed by: Paulette Blanch   Total critical care time: 30 minutes  Critical care time was exclusive of separately billable procedures and treating other patients.  Critical care was necessary to treat or prevent imminent or life-threatening deterioration.  Critical care was time spent personally by me on the following activities: development of treatment plan with patient and/or surrogate as well as nursing, discussions with consultants, evaluation of patient's response to treatment, examination of patient, obtaining history from patient or surrogate, ordering and performing treatments and interventions, ordering and review of laboratory studies, ordering and review of radiographic studies, pulse oximetry and re-evaluation of patient's condition.  ____________________________________________   INITIAL IMPRESSION / ASSESSMENT AND PLAN / ED COURSE  As part of my medical decision making, I reviewed the following data within the [facility] notes reviewed and incorporated, Labs reviewed, EKG interpreted, Old EKG reviewed, Old chart reviewed, Radiograph reviewed and Notes from prior ED visits    63 year old male noncompliant with hemodialysis, cocaine abuser who  returns to the ED for chest pain. Differential diagnosis includes, but is not limited to, ACS, aortic dissection, pulmonary embolism, cardiac tamponade, pneumothorax, pneumonia, pericarditis, myocarditis, GI-related causes including esophagitis/gastritis, and musculoskeletal chest wall pain.    This is patient's fifth ED visit since 08/14/2018.  He has been hospitalized 3 times already this month, on 2/4, 2/9 and 2/15.  Evidently he is set up with outpatient dialysis but he has never been and tells me he is not sure he will go on Tuesday as he is scheduled.  He also adamantly denies cocaine use although the last 2 hospitalizations on 2/10 and 2/15 patient has  tested positive for cocaine metabolites.  He was just discharged from the hospital at 10:30 AM and not due for dialysis until tomorrow, 2/18.  Will obtain cardiac work-up, chest x-ray, low-dose Ativan for cocaine induced chest pain.  Unless there is objective data of any change or reason for admission, anticipate patient may be discharged home if lab and imaging studies are stable.   Clinical Course as of Aug 28 41  Mon Aug 27, 2018  3953 Laboratory and imaging results noted.  Troponin slightly lower than previous.  Will repeat timed troponin.  Stable anemia.  Normal potassium.  Slightly elevated lipase without abdominal pain, nausea or vomiting.   [JS]  [phone] Patient getting agitated because he is not receiving IV morphine or IV Dilaudid per his request.  Another 0.5 mg IV Ativan given.  Offered patient a breathing treatment which she accepts.  At this time care is transferred to Dr. Cinda Quest pending results of repeat troponin and reassessment.   [JS]  0953 Hemoglobin(!): 9.6 [PM]  1101 CT Angio Chest PE W and/or Wo Contrast [PM]    Clinical Course User Index [JS] Paulette Blanch, MD [PM] Nena Polio, MD     ____________________________________________   FINAL CLINICAL IMPRESSION(S) / ED DIAGNOSES  Final diagnoses:  Nonspecific chest  pain  ESRD (end stage renal disease) [facility])  Noncompliance     ED Discharge Orders    None       Note:  This document was prepared using Dragon voice recognition software and may include unintentional dictation errors.   Paulette Blanch, MD 08/27/18 3435    Paulette Blanch, MD 08/28/18 6861    Paulette Blanch, MD 09/10/18 [phone]

## 2018-08-27 NOTE — ED Notes (Signed)
ED TO INPATIENT HANDOFF REPORT  Name/Age/Gender [name] 63 y.o. male  Code Status Code Status History    Date Active Date Inactive Code Status Order ID Comments User Context   08/25/2018 0802 08/26/2018 1333 Full Code [id number]  Sela Hua, MD ED   08/20/2018 0400 08/21/2018 1823 Full Code [id number]  Harrie Foreman, MD Inpatient   08/15/2018 0058 08/18/2018 1624 Full Code [id number]  Lance Coon, MD Inpatient   09/21/2016 1519 09/27/2016 2042 Full Code [id number]  Hillary Bow, MD ED   04/28/2016 2325 04/29/2016 2043 Full Code [id number]  Muthersbaugh, Gwenlyn Perking ED   01/18/2016 0202 01/18/2016 1848 Full Code [id number]  Quintella Baton, MD Inpatient      Home/SNF/Other Home  Chief Complaint chest pain  Level of Care/Admitting Diagnosis ED Disposition    ED Disposition Condition [place]: [place] [100120]  Level of Care: Med-Surg [16]  Diagnosis: HCAP (healthcare-associated pneumonia) [536644]  Admitting Physician: Gorden Harms [[id number]]  Attending Physician: Gorden Harms [[id number]]  PT Class (Do Not Modify): Observation [104]  PT Acc Code (Do Not Modify): Observation [10022]       Medical History Past Medical History:  Diagnosis Date  . Depression   . Heart attack ([place])   . Hypertension   . PE (pulmonary embolism)   . Renal disorder     Allergies Allergies  Allergen Reactions  . Cyclobenzaprine Nausea Only  . Clonidine Other (See Comments)    Asymptomatic bradycardia to 38  . Furosemide Other (See Comments)    Caused renal failure  . Tylenol [Acetaminophen] Other (See Comments)    Reaction:  Pt states it bothers his liver    IV Location/Drains/Wounds Patient Lines/Drains/Airways Status   Active Line/Drains/Airways    Name:   Placement date:   Placement time:   Site:   Days:   Peripheral IV 08/27/18 Right Hand   08/27/18    0420    Hand   less than 1   Peripheral IV 08/27/18 Right Forearm    08/27/18    1045    Forearm   less than 1   Fistula / Graft Left Upper arm Arteriovenous fistula   -    -    Upper arm             Labs/Imaging Results for orders placed or performed during the hospital encounter of 08/27/18 (from the past 48 hour(s))  CBC with Differential     Status: Abnormal   Collection Time: 08/27/18  4:39 AM  Result Value Ref Range   WBC 12.8 (H) 4.0 - 10.5 K/uL   RBC 3.37 (L) 4.22 - 5.81 MIL/uL   Hemoglobin 9.6 (L) 13.0 - 17.0 g/dL   HCT 29.2 (L) 39.0 - 52.0 %   MCV 86.6 80.0 - 100.0 fL   MCH 28.5 26.0 - 34.0 pg   MCHC 32.9 30.0 - 36.0 g/dL   RDW 14.8 11.5 - 15.5 %   Platelets 173 150 - 400 K/uL   nRBC 0.0 0.0 - 0.2 %   Neutrophils Relative % 80 %   Neutro Abs 10.2 (H) 1.7 - 7.7 K/uL   Lymphocytes Relative 9 %   Lymphs Abs 1.2 0.7 - 4.0 K/uL   Monocytes Relative 8 %   Monocytes Absolute 1.1 (H) 0.1 - 1.0 K/uL   Eosinophils Relative 0 %   Eosinophils Absolute 0.0 0.0 - 0.5 K/uL   Basophils Relative 0 %  Basophils Absolute 0.0 0.0 - 0.1 K/uL   WBC Morphology MILD LEFT SHIFT (1-5% METAS, OCC MYELO, OCC BANDS)    RBC Morphology MORPHOLOGY UNREMARKABLE    Smear Review Normal platelet morphology    Immature Granulocytes 3 %   Abs Immature Granulocytes 0.33 (H) 0.00 - 0.07 K/uL    Comment: Performed at [facility], [address] [place], [place] [address]  Comprehensive metabolic panel     Status: Abnormal   Collection Time: 08/27/18  4:39 AM  Result Value Ref Range   Sodium 136 135 - 145 mmol/L   Potassium 4.6 3.5 - 5.1 mmol/L   Chloride 101 98 - 111 mmol/L   CO2 25 22 - 32 mmol/L   Glucose, Bld 115 (H) 70 - 99 mg/dL   BUN 66 (H) 6 - 20 mg/dL   Creatinine, Ser 7.16 (H) 0.61 - 1.24 mg/dL   Calcium 8.7 (L) 8.9 - 10.3 mg/dL   Total Protein 6.8 6.5 - 8.1 g/dL   Albumin 3.3 (L) 3.5 - 5.0 g/dL   AST 28 15 - 41 U/L   ALT 41 0 - 44 U/L   Alkaline Phosphatase 54 38 - 126 U/L   Total Bilirubin 0.5 0.3 - 1.2 mg/dL   GFR calc non Af Amer 8  (L) >60 mL/min   GFR calc Af Amer 9 (L) >60 mL/min   Anion gap 10 5 - 15    Comment: Performed at [facility], [address] [place], [place] [address]  Ethanol     Status: None   Collection Time: 08/27/18  4:39 AM  Result Value Ref Range   Alcohol, Ethyl (B) <10 <10 mg/dL    Comment: (NOTE) Lowest detectable limit for serum alcohol is 10 mg/dL. For medical purposes only. Performed at [facility], [address] [place], [place] [address]   Troponin I - Once     Status: Abnormal   Collection Time: 08/27/18  4:39 AM  Result Value Ref Range   Troponin I 0.07 (HH) <0.03 ng/mL    Comment: CRITICAL RESULT CALLED TO, READ BACK BY AND VERIFIED WITH LORI ALLEN 08/27/2018 0505 KBH Performed at [facility], [address] [place], [place] [address]   Lipase, blood     Status: Abnormal   Collection Time: 08/27/18  4:39 AM  Result Value Ref Range   Lipase 61 (H) 11 - 51 U/L    Comment: Performed at [facility], [address] [place], [place] [address]  Troponin I - Once-Timed     Status: Abnormal   Collection Time: 08/27/18  7:45 AM  Result Value Ref Range   Troponin I 0.07 (HH) <0.03 ng/mL    Comment: CRITICAL VALUE NOTED. VALUE IS CONSISTENT WITH PREVIOUSLY REPORTED/CALLED VALUE/HKP Performed at [facility], [address] [place], [place] [address]   Fibrin derivatives D-Dimer     Status: Abnormal   Collection Time: 08/27/18  9:54 AM  Result Value Ref Range   Fibrin derivatives D-dimer ([facility]) 1,401.72 (H) 0.00 - 499.00 ng/mL (FEU)    Comment: (NOTE) <> Exclusion of Venous Thromboembolism (VTE) - OUTPATIENT ONLY   (Emergency Department or [place])   0-499 ng/ml (FEU): With a low to intermediate pretest probability                      for VTE this test result excludes the diagnosis  of VTE.   >499 ng/ml (FEU) : VTE not excluded; additional work up for VTE is                      required. <> Testing on  Inpatients and Evaluation of Disseminated Intravascular   Coagulation (DIC) Reference Range:   0-499 ng/ml (FEU) Performed at [facility], [address] [place], [place] [address]    Ct Angio Chest Pe W And/or Wo Contrast  Result Date: 08/27/2018 CLINICAL DATA:  Acute presentation with chest pain. History of hypertension and pulmonary emboli. Dialysis patient. EXAM: CT ANGIOGRAPHY CHEST WITH CONTRAST TECHNIQUE: Multidetector CT imaging of the chest was performed using the standard protocol during bolus administration of intravenous contrast. Multiplanar CT image reconstructions and MIPs were obtained to evaluate the vascular anatomy. CONTRAST:  52mL OMNIPAQUE IOHEXOL 350 MG/ML SOLN COMPARISON:  Chest radiography same day.  Chest CT 08/14/2018. FINDINGS: Cardiovascular: Pulmonary arterial opacification is moderate. No pulmonary emboli are seen. There is aortic atherosclerosis but no aneurysm or dissection. There is some coronary artery calcification. Heart size is mildly enlarged. No pericardial fluid. Mediastinum/Nodes: No mass or lymphadenopathy. Normal mediastinal lymph nodes. Lungs/Pleura: Patchy perihilar infiltrate on the left consistent with left lower lobe superior segment bronchopneumonia. Chronic lung cyst at the left base laterally. No pleural fluid. Upper Abdomen: Negative Musculoskeletal: Ordinary thoracic degenerative changes. No acute finding. Review of the MIP images confirms the above findings. IMPRESSION: No pulmonary emboli. Bronchopneumonia in the superior segment of the left lower lobe. Cardiomegaly. Aortic atherosclerosis. Coronary artery calcification. Electronically Signed   By: Nelson Chimes M.D.   On: 08/27/2018 11:09   Dg Chest Port 1 View  Result Date: 08/27/2018 CLINICAL DATA:  Chest pain EXAM: PORTABLE CHEST 1 VIEW COMPARISON:  Two days ago FINDINGS: Chronic cardiomegaly and vascular pedicle widening. There is no edema, consolidation, effusion, or pneumothorax.  Negative for fracture IMPRESSION: 1. No acute finding. 2. Cardiomegaly. Electronically Signed   By: Monte Fantasia M.D.   On: 08/27/2018 04:56    Pending Labs Unresulted Labs (From admission, onward)    Start     Ordered   08/28/18 0500  MRSA PCR Screening  Tomorrow morning,   STAT     08/27/18 1242   08/27/18 1153  Culture, blood (routine x 2)  BLOOD CULTURE X 2,   STAT     08/27/18 1152   Signed and Held  Culture, blood (routine x 2) Call MD if unable to obtain prior to antibiotics being given  BLOOD CULTURE X 2,   R    Comments:  If blood cultures drawn in Emergency Department - Do not draw and cancel order    Signed and Held   Signed and Held  Culture, sputum-assessment  Once,   R     Signed and Held   Signed and Held  Gram stain  Once,   R     Signed and Held   Signed and Held  HIV antibody (Routine Screening)  Once,   R     Signed and Held   Signed and Held  Strep pneumoniae urinary antigen  Once,   R     Signed and Held   Signed and Held  CBC  (heparin)  Once,   R    Comments:  Baseline for heparin therapy IF NOT ALREADY DRAWN.  Notify MD if PLT < 100 K.    Signed and Held   Signed and Held  Creatinine, serum  (  heparin)  Once,   R    Comments:  Baseline for heparin therapy IF NOT ALREADY DRAWN.    Signed and Held   Signed and Held  Legionella Pneumophila Serogp 1 Ur Ag  Once,   R     Signed and Held   Signed and Held  Troponin I - Now Then Q6H  Now then every 6 hours,   R     Signed and Held   Signed and Held  Renal function panel  Tomorrow morning,   R    Comments:  Building control surveyor at Dialysis    Signed and Held   Signed and Held  CBC  Tomorrow morning,   R    Comments:  Collect at dialysis if possible    Signed and Held          Vitals/Pain Today's Vitals   08/27/18 0730 08/27/18 0930 08/27/18 1045 08/27/18 1405  BP: (!) 151/90 (!) 150/95 (!) 152/97 (!) 148/96  Pulse: 76 76 76 75  Resp:   18 18  TempSrc:      SpO2: 100% 100% 100% 100%  Weight:      Height:       PainSc:   9  9     Isolation Precautions No active isolations  Medications Medications  acetaminophen (OFIRMEV) IV 1,000 mg ( Intravenous Stopped 08/27/18 0932)  oxyCODONE (OXYCONTIN) 12 hr tablet 20 mg (has no administration in time range)  oxyCODONE (Oxy IR/ROXICODONE) immediate release tablet 10-20 mg (10 mg Oral Given 08/27/18 1546)  nicotine (NICODERM CQ - dosed in mg/24 hours) patch 14 mg (has no administration in time range)  ceFEPIme (MAXIPIME) 1 g in sodium chloride 0.9 % 100 mL IVPB (has no administration in time range)  ceFEPIme (MAXIPIME) 500 mg in dextrose 5 % 50 mL IVPB (has no administration in time range)  vancomycin (VANCOCIN) IVPB 1000 mg/200 mL premix (has no administration in time range)  Chlorhexidine Gluconate Cloth 2 % PADS 6 each (has no administration in time range)  LORazepam (ATIVAN) injection 0.5 mg (0.5 mg Intravenous Given 08/27/18 0447)  LORazepam (ATIVAN) injection 0.5 mg (0.5 mg Intravenous Given 08/27/18 0656)  ipratropium-albuterol (DUONEB) 0.5-2.5 (3) MG/3ML nebulizer solution 3 mL (3 mLs Nebulization Given 08/27/18 0729)  iohexol (OMNIPAQUE) 350 MG/ML injection 75 mL (75 mLs Intravenous Contrast Given 08/27/18 1056)  cefTRIAXone (ROCEPHIN) 1 g in sodium chloride 0.9 % 100 mL IVPB (0 g Intravenous Stopped 08/27/18 1422)  azithromycin (ZITHROMAX) 500 mg in sodium chloride 0.9 % 250 mL IVPB (0 mg Intravenous Stopped 08/27/18 1454)  vancomycin (VANCOCIN) 2,000 mg in sodium chloride 0.9 % 500 mL IVPB (0 mg Intravenous Stopped 08/27/18 1636)    Mobility walks with person assist

## 2018-08-27 NOTE — ED Notes (Signed)
Collected single set of blood cultures by straight stick. Unable to collect 2nd set, pt already has 2 IV lines and no other available sites. IV antibiotics started.

## 2018-08-27 NOTE — Discharge Instructions (Addendum)
Go to dialysis on Tuesday as scheduled.  If you do not go, you will feel worse and get sicker.  Take your medicines as directed by your doctor.  Do not use illicit drugs.  Return to the ER for worsening symptoms, persistent vomiting, difficulty breathing or other concerns.

## 2018-08-27 NOTE — Progress Notes (Signed)
[facility]  ROUNDING NOTE   Subjective:   [name] admitted to [facility] on 08/27/2018 for chest pain  Patient has had multiple admissions in the recent past.  Today, he is brought in to the ER via EMS for chest pain.  He was dialyzed on Saturday and discharged late that night.  D-dimer was noted to be elevated therefore a CT scan with IV contrast was done.  Results are negative for pulmonary emboli but showed bronchopneumonia in the left lower lobe.  Patient is now being admitted for pneumonia. Nephrology consult requested for dialysis support  Objective:  Vital signs in last 24 hours:  Pulse Rate:  [74-83] 75 (02/17 1405) Resp:  [17-28] 18 (02/17 1405) BP: (135-154)/(76-97) 148/96 (02/17 1405) SpO2:  [97 %-100 %] 100 % (02/17 1405) Weight:  [124.8 kg] 124.8 kg (02/17 0446)  Weight change:  Filed Weights   08/27/18 0446  Weight: 124.8 kg    Intake/Output: No intake/output data recorded.   Intake/Output this shift:  Total I/O In: 87.4 [IV Piggyback:87.4] Out: -   Physical Exam: General: No acute distress, sitting in recliner chair  Head: Normocephalic, atraumatic. Moist oral mucosal membranes  Eyes: Anicteric  Neck: Supple, trachea midline  Lungs:   Bilateral crackles and wheezing left greater than right  Heart: S1S2 no rubs  Abdomen:  Soft, nontender, bowel sounds present  Extremities: no peripheral edema.  Neurologic: Awake, alert, following commands  Skin: No lesions  Access: LUE AV Fistula    Basic Metabolic Panel: Recent Labs  Lab 08/25/18 0526 08/25/18 1850 08/27/18 0439  NA 139 137 136  K 4.2 5.0 4.6  CL 104 103 101  CO2 27 23 25   GLUCOSE 114* 166* 115*  BUN 49* 58* 66*  CREATININE 6.88* 7.06* 7.16*  CALCIUM 8.7* 8.5* 8.7*    Liver Function Tests: Recent Labs  Lab 08/25/18 0526 08/27/18 0439  AST 29 28  ALT 32 41  ALKPHOS 62 54  BILITOT 0.8 0.5  PROT 7.0 6.8  ALBUMIN 3.3* 3.3*   Recent Labs  Lab 08/25/18 0526  08/27/18 0439  LIPASE 45 61*   No results for input(s): AMMONIA in the last 168 hours.  CBC: Recent Labs  Lab 08/25/18 0526 08/25/18 1850 08/27/18 0439  WBC 7.2 6.7 12.8*  NEUTROABS 5.4  --  10.2*  HGB 9.7* 9.4* 9.6*  HCT 30.1* 28.4* 29.2*  MCV 88.8 87.7 86.6  PLT 152 147* 173    Cardiac Enzymes: Recent Labs  Lab 08/25/18 0526 08/25/18 0908 08/25/18 1345 08/27/18 0439 08/27/18 0745  TROPONINI 0.09* 0.09* 0.08* 0.07* 0.07*    BNP: Invalid input(s): POCBNP  CBG: No results for input(s): GLUCAP in the last 168 hours.  Microbiology: Results for orders placed or performed during the hospital encounter of 08/14/18  MRSA PCR Screening     Status: None   Collection Time: 08/15/18  1:12 AM  Result Value Ref Range Status   MRSA by PCR NEGATIVE NEGATIVE Final    Comment:        The GeneXpert MRSA Assay (FDA approved for NASAL specimens only), is one component of a comprehensive MRSA colonization surveillance program. It is not intended to diagnose MRSA infection nor to guide or monitor treatment for MRSA infections. Performed at [facility], [address] [place], [place] [address]     Coagulation Studies: No results for input(s): LABPROT, INR in the last 72 hours.  Urinalysis: No results for input(s): COLORURINE, LABSPEC, [place], [place],  HGBUR, BILIRUBINUR, KETONESUR, PROTEINUR, UROBILINOGEN, NITRITE, LEUKOCYTESUR in the last 72 hours.  Invalid input(s): APPERANCEUR    Imaging: Ct Angio Chest Pe W And/or Wo Contrast  Result Date: 08/27/2018 CLINICAL DATA:  Acute presentation with chest pain. History of hypertension and pulmonary emboli. Dialysis patient. EXAM: CT ANGIOGRAPHY CHEST WITH CONTRAST TECHNIQUE: Multidetector CT imaging of the chest was performed using the standard protocol during bolus administration of intravenous contrast. Multiplanar CT image reconstructions and MIPs were obtained to evaluate the vascular anatomy. CONTRAST:   95mL OMNIPAQUE IOHEXOL 350 MG/ML SOLN COMPARISON:  Chest radiography same day.  Chest CT 08/14/2018. FINDINGS: Cardiovascular: Pulmonary arterial opacification is moderate. No pulmonary emboli are seen. There is aortic atherosclerosis but no aneurysm or dissection. There is some coronary artery calcification. Heart size is mildly enlarged. No pericardial fluid. Mediastinum/Nodes: No mass or lymphadenopathy. Normal mediastinal lymph nodes. Lungs/Pleura: Patchy perihilar infiltrate on the left consistent with left lower lobe superior segment bronchopneumonia. Chronic lung cyst at the left base laterally. No pleural fluid. Upper Abdomen: Negative Musculoskeletal: Ordinary thoracic degenerative changes. No acute finding. Review of the MIP images confirms the above findings. IMPRESSION: No pulmonary emboli. Bronchopneumonia in the superior segment of the left lower lobe. Cardiomegaly. Aortic atherosclerosis. Coronary artery calcification. Electronically Signed   By: Nelson Chimes M.D.   On: 08/27/2018 11:09   Dg Chest Port 1 View  Result Date: 08/27/2018 CLINICAL DATA:  Chest pain EXAM: PORTABLE CHEST 1 VIEW COMPARISON:  Two days ago FINDINGS: Chronic cardiomegaly and vascular pedicle widening. There is no edema, consolidation, effusion, or pneumothorax. Negative for fracture IMPRESSION: 1. No acute finding. 2. Cardiomegaly. Electronically Signed   By: Monte Fantasia M.D.   On: 08/27/2018 04:56     Medications:   . acetaminophen Stopped (08/27/18 0932)  . azithromycin (ZITHROMAX) 500 MG IVPB (Vial-Mate [employer]) 500 mg (08/27/18 1354)  . ceFEPime (MAXIPIME) IV    . cefTRIAXone (ROCEPHIN)  IV 1 g (08/27/18 1352)  . vancomycin    . [START ON 08/28/2018] vancomycin     . [START ON 08/28/2018] ceFEPime (MAXIPIME) IV  500 mg Intravenous Q24H  . nicotine  14 mg Transdermal Daily  . oxyCODONE  20 mg Oral Q12H   oxyCODONE  Assessment/ Plan:  [name] is a 63 y.o. black male with end stage renal  disease on hemodialysis, depression, hypertension, pulmonary embolism, substance abuse.   1. End-stage renal disease: Patient gets outpatient dialysis in [facility].    We will plan to dialyze the patient tomorrow We will inquire with patient pathways program about previous discharge planning  2. Bronchopneumonia Treatment as per hospitalist team  3.  Anemia chronic kidney disease.  With thrombocytopenia   - EPO with HD treatment  4.  Secondary hyperparathyroidism with hyperphosphatemia, -Monitor phosphorus and if elevated, start Renvela with meals.    LOS: 0 Montel Vanderhoof 2/17/20202:18 PM

## 2018-08-27 NOTE — ED Notes (Signed)
While rounding on the pt,. Pt is sound asleep and wakes after a minute and ask for pain meds, states he is having pain in his chest. EDP notified.

## 2018-08-27 NOTE — ED Triage Notes (Addendum)
Pt arrives from home via EMS with c/o sharp pain and pressure all the way across his chest; pt is known to this nurse as I took care of him last Sunday; pt says he did all of his dialysis treatments this past week-Tuesday/Thursday/Saturday; last weekend pt said he hadn't been in weeks; pt breathing loudly-cursing at times; pt awake and alert, talking in complete coherent sentences; faint expiratory wheezes noted;

## 2018-08-27 NOTE — ED Notes (Signed)
Pt asked to sit on side of bed to void; was unable to provide specimen

## 2018-08-27 NOTE — Progress Notes (Signed)
Family Meeting Note  Advance Directive:yes  Today a meeting took place with the Patient.  Patient is able to participate   The following clinical team members were present during this meeting:MD  The following were discussed:Patient's diagnosis:hcap, esrd , Patient's progosis: Unable to determine and Goals for treatment: Full Code  Additional follow-up to be provided: prn  Time spent during discussion:20 minutes  Gorden Harms, MD

## 2018-08-28 DIAGNOSIS — Z888 Allergy status to other drugs, medicaments and biological substances status: Secondary | ICD-10-CM | POA: Diagnosis not present

## 2018-08-28 DIAGNOSIS — N400 Enlarged prostate without lower urinary tract symptoms: Secondary | ICD-10-CM | POA: Diagnosis present

## 2018-08-28 DIAGNOSIS — Z8249 Family history of ischemic heart disease and other diseases of the circulatory system: Secondary | ICD-10-CM | POA: Diagnosis not present

## 2018-08-28 DIAGNOSIS — N186 End stage renal disease: Secondary | ICD-10-CM | POA: Diagnosis present

## 2018-08-28 DIAGNOSIS — I248 Other forms of acute ischemic heart disease: Secondary | ICD-10-CM | POA: Diagnosis present

## 2018-08-28 DIAGNOSIS — Z9119 Patient's noncompliance with other medical treatment and regimen: Secondary | ICD-10-CM | POA: Diagnosis not present

## 2018-08-28 DIAGNOSIS — I2699 Other pulmonary embolism without acute cor pulmonale: Secondary | ICD-10-CM | POA: Diagnosis present

## 2018-08-28 DIAGNOSIS — F329 Major depressive disorder, single episode, unspecified: Secondary | ICD-10-CM | POA: Diagnosis present

## 2018-08-28 DIAGNOSIS — N2581 Secondary hyperparathyroidism of renal origin: Secondary | ICD-10-CM | POA: Diagnosis present

## 2018-08-28 DIAGNOSIS — E785 Hyperlipidemia, unspecified: Secondary | ICD-10-CM | POA: Diagnosis present

## 2018-08-28 DIAGNOSIS — J441 Chronic obstructive pulmonary disease with (acute) exacerbation: Secondary | ICD-10-CM | POA: Diagnosis present

## 2018-08-28 DIAGNOSIS — Y95 Nosocomial condition: Secondary | ICD-10-CM | POA: Diagnosis present

## 2018-08-28 DIAGNOSIS — I120 Hypertensive chronic kidney disease with stage 5 chronic kidney disease or end stage renal disease: Secondary | ICD-10-CM | POA: Diagnosis present

## 2018-08-28 DIAGNOSIS — G894 Chronic pain syndrome: Secondary | ICD-10-CM | POA: Diagnosis present

## 2018-08-28 DIAGNOSIS — I252 Old myocardial infarction: Secondary | ICD-10-CM | POA: Diagnosis not present

## 2018-08-28 DIAGNOSIS — D631 Anemia in chronic kidney disease: Secondary | ICD-10-CM | POA: Diagnosis present

## 2018-08-28 DIAGNOSIS — F17210 Nicotine dependence, cigarettes, uncomplicated: Secondary | ICD-10-CM | POA: Diagnosis present

## 2018-08-28 DIAGNOSIS — J440 Chronic obstructive pulmonary disease with acute lower respiratory infection: Secondary | ICD-10-CM | POA: Diagnosis present

## 2018-08-28 DIAGNOSIS — I2510 Atherosclerotic heart disease of native coronary artery without angina pectoris: Secondary | ICD-10-CM | POA: Diagnosis present

## 2018-08-28 DIAGNOSIS — R0789 Other chest pain: Secondary | ICD-10-CM | POA: Diagnosis present

## 2018-08-28 DIAGNOSIS — F1410 Cocaine abuse, uncomplicated: Secondary | ICD-10-CM | POA: Diagnosis present

## 2018-08-28 DIAGNOSIS — J180 Bronchopneumonia, unspecified organism: Secondary | ICD-10-CM | POA: Diagnosis present

## 2018-08-28 DIAGNOSIS — Z86711 Personal history of pulmonary embolism: Secondary | ICD-10-CM | POA: Diagnosis not present

## 2018-08-28 DIAGNOSIS — Z992 Dependence on renal dialysis: Secondary | ICD-10-CM | POA: Diagnosis not present

## 2018-08-28 DIAGNOSIS — D696 Thrombocytopenia, unspecified: Secondary | ICD-10-CM | POA: Diagnosis present

## 2018-08-28 LAB — TROPONIN I
Troponin I: 0.06 ng/mL (ref <0.03)
Troponin I: 0.05 ng/mL (ref <0.03)

## 2018-08-28 LAB — STREP PNEUMONIAE URINARY ANTIGEN: Strep Pneumo Urinary Antigen: NEGATIVE

## 2018-08-28 NOTE — Care Management Obs Status (Signed)
MEDICARE OBSERVATION STATUS NOTIFICATION   Patient Details  Name: [name] MRN: [id number] Date of Birth: [birth year]   Medicare Observation Status Notification Given:  No(Patient off the floor for HD.  Will try at a later time)    Beverly Sessions, RN 08/28/2018, 3:01 PM

## 2018-08-28 NOTE — Progress Notes (Signed)
[facility] [place] NAME: [name]    MR#:  [id number]  DATE OF BIRTH:  [birth year]  SUBJECTIVE: Patient admitted yesterday for shortness of breath and found to have elevated d-dimer so we did CT angios chest negative for PE but found out he has bronchopneumonia.  Patient does have some cough and shortness of breath even though he is not hypoxic.  CHIEF COMPLAINT:   Chief Complaint  Patient presents with  . Chest Pain    REVIEW OF SYSTEMS:   ROS CONSTITUTIONAL: No fever, fatigue or weakness.  EYES: No blurred or double vision.  EARS, NOSE, AND THROAT: No tinnitus or ear pain.  RESPIRATORY: Has mild cough, shortness of breath.Marland Kitchen  CARDIOVASCULAR: No chest pain, orthopnea, edema.  GASTROINTESTINAL: No nausea, vomiting, diarrhea or abdominal pain.  GENITOURINARY: No dysuria, hematuria.  ENDOCRINE: No polyuria, nocturia,  HEMATOLOGY: No anemia, easy bruising or bleeding SKIN: No rash or lesion. MUSCULOSKELETAL: No joint pain or arthritis.   NEUROLOGIC: No tingling, numbness, weakness.  PSYCHIATRY: No anxiety or depression.   DRUG ALLERGIES:   Allergies  Allergen Reactions  . Cyclobenzaprine Nausea Only  . Clonidine Other (See Comments)    Asymptomatic bradycardia to 38  . Furosemide Other (See Comments)    Caused renal failure  . Tylenol [Acetaminophen] Other (See Comments)    Reaction:  Pt states it bothers his liver    VITALS:  Blood pressure 125/72, pulse 70, temperature 97.6 F (36.4 C), temperature source Oral, resp. rate 20, height 6' (1.829 m), weight 124.8 kg, SpO2 97 %.  PHYSICAL EXAMINATION:  GENERAL:  63 y.o.-year-old patient lying in the bed with no acute distress.  EYES: Pupils equal, round, reactive to light and accommodation. No scleral icterus. Extraocular muscles intact.  HEENT: Head atraumatic, normocephalic. Oropharynx and nasopharynx clear.  NECK:  Supple, no jugular venous distention. No  thyroid enlargement, no tenderness.  LUNGS: \Diminished breath sounds at the bases.  CARDIOVASCULAR: S1, S2 normal. No murmurs, rubs, or gallops.  ABDOMEN: Soft, nontender, nondistended. Bowel sounds present. No organomegaly or mass.  EXTREMITIES: No pedal edema, cyanosis, or clubbing.  NEUROLOGIC: Cranial nerves II through XII are intact. Muscle strength 5/5 in all extremities. Sensation intact. Gait not checked.  PSYCHIATRIC: The patient is alert and oriented x 3.  SKIN: No obvious rash, lesion, or ulcer.    LABORATORY PANEL:   CBC Recent Labs  Lab 08/27/18 0439  WBC 12.8*  HGB 9.6*  HCT 29.2*  PLT 173   ------------------------------------------------------------------------------------------------------------------  Chemistries  Recent Labs  Lab 08/27/18 0439  NA 136  K 4.6  CL 101  CO2 25  GLUCOSE 115*  BUN 66*  CREATININE 7.16*  CALCIUM 8.7*  AST 28  ALT 41  ALKPHOS 54  BILITOT 0.5   ------------------------------------------------------------------------------------------------------------------  Cardiac Enzymes Recent Labs  Lab 08/28/18 0620  TROPONINI 0.05*   ------------------------------------------------------------------------------------------------------------------  RADIOLOGY:  Ct Angio Chest Pe W And/or Wo Contrast  Result Date: 08/27/2018 CLINICAL DATA:  Acute presentation with chest pain. History of hypertension and pulmonary emboli. Dialysis patient. EXAM: CT ANGIOGRAPHY CHEST WITH CONTRAST TECHNIQUE: Multidetector CT imaging of the chest was performed using the standard protocol during bolus administration of intravenous contrast. Multiplanar CT image reconstructions and MIPs were obtained to evaluate the vascular anatomy. CONTRAST:  68mL OMNIPAQUE IOHEXOL 350 MG/ML SOLN COMPARISON:  Chest radiography same day.  Chest CT 08/14/2018. FINDINGS: Cardiovascular: Pulmonary arterial opacification is moderate. No pulmonary emboli are seen. There is  aortic atherosclerosis but no aneurysm or dissection. There is some coronary artery calcification. Heart size is mildly enlarged. No pericardial fluid. Mediastinum/Nodes: No mass or lymphadenopathy. Normal mediastinal lymph nodes. Lungs/Pleura: Patchy perihilar infiltrate on the left consistent with left lower lobe superior segment bronchopneumonia. Chronic lung cyst at the left base laterally. No pleural fluid. Upper Abdomen: Negative Musculoskeletal: Ordinary thoracic degenerative changes. No acute finding. Review of the MIP images confirms the above findings. IMPRESSION: No pulmonary emboli. Bronchopneumonia in the superior segment of the left lower lobe. Cardiomegaly. Aortic atherosclerosis. Coronary artery calcification. Electronically Signed   By: Nelson Chimes M.D.   On: 08/27/2018 11:09   Dg Chest Port 1 View  Result Date: 08/27/2018 CLINICAL DATA:  Chest pain EXAM: PORTABLE CHEST 1 VIEW COMPARISON:  Two days ago FINDINGS: Chronic cardiomegaly and vascular pedicle widening. There is no edema, consolidation, effusion, or pneumothorax. Negative for fracture IMPRESSION: 1. No acute finding. 2. Cardiomegaly. Electronically Signed   By: Monte Fantasia M.D.   On: 08/27/2018 04:56    EKG:   Orders placed or performed during the hospital encounter of 08/27/18  . EKG 12-Lead  . EKG 12-Lead    ASSESSMENT AND PLAN:   63 year old male with ESRD on hemodialysis, recurrent medical admissions with shortness of breath admitted again yesterday for shortness of breath, found to have bronchopneumonia. 1.  Bronchopneumonia, continue IV Rocephin, Zithromax 2.  COPD: No exacerbation.  Patient to continue bronchodilators as needed. 3.  ESRD on hemodialysis Tuesday, Thursday, Saturday.  Nephrology is following. 4.  CAD: Chest no chest pain.  Continue aspirin, Plavix, Imdur. 5..  Chronic pain: Patient on OxyContin, oxycodone. 6.  BPH: Continue Flomax.   More than 50% time spent in counseling, coordination of  care  All the records are reviewed and case discussed with Care Management/Social Workerr. Management plans discussed with the patient, family and they are in agreement.  CODE STATUS: full  TOTAL TIME TAKING CARE OF THIS PATIENT: 35 minutes.   POSSIBLE D/C IN 1-2DAYS, DEPENDING ON CLINICAL CONDITION.   Epifanio Lesches M.D on 08/28/2018 at 12:38 PM  Between 7am to 6pm - Pager - [phone]  After 6pm go to [email or web address] - password [email or web address] [facility]  Office  [phone]  CC: Primary care physician; Patient, No Pcp Per   Note: This dictation was prepared with Dragon dictation along with smaller phrase technology. Any transcriptional errors that result from this process are unintentional.

## 2018-08-28 NOTE — Progress Notes (Signed)
Post HD  [facility]    08/28/18 1545  Vital Signs  Pulse Rate 69  BP 124/72  Oxygen Therapy  SpO2 98 %  O2 Device Nasal Cannula  O2 Flow Rate (L/min) 3 L/min  Pulse Oximetry Type Continuous  Pain Assessment  Pain Scale 0-10  Pain Score 0  Dialysis Weight  Weight 127.8 kg  Type of Weight Post-Dialysis  Fistula / Graft Left Upper arm Arteriovenous fistula  No Placement Date or Time found.   Placed prior to admission: Yes  Orientation: Left  Access Location: Upper arm  Access Type: Arteriovenous fistula  Site Condition No complications  Fistula / Graft Assessment Present;Thrill;Bruit  Status Deaccessed  Drainage Description None

## 2018-08-28 NOTE — Progress Notes (Signed)
HD Tx started    08/28/18 1148  Vital Signs  Pulse Rate 88  Pulse Rate Source Monitor  Resp 16  BP 119/73  BP Location Right Arm  BP Method Automatic  Patient Position (if appropriate) Lying  Oxygen Therapy  SpO2 99 %  O2 Device Nasal Cannula  O2 Flow Rate (L/min) 3 L/min  Pulse Oximetry Type Continuous  During Hemodialysis Assessment  Blood Flow Rate (mL/min) 400 mL/min  Arterial Pressure (mmHg) -190 mmHg  Venous Pressure (mmHg) 220 mmHg  Transmembrane Pressure (mmHg) 60 mmHg  Ultrafiltration Rate (mL/min) 1000 mL/min  Dialysate Flow Rate (mL/min) 600 ml/min  Conductivity: Machine  13.9  HD Safety Checks Performed Yes  Dialysis Fluid Bolus Normal Saline  Bolus Amount (mL) 250 mL  Intra-Hemodialysis Comments Tx initiated  Fistula / Graft Left Upper arm Arteriovenous fistula  No Placement Date or Time found.   Placed prior to admission: Yes  Orientation: Left  Access Location: Upper arm  Access Type: Arteriovenous fistula  Status Accessed  Needle Size 15 g  Drainage Description None

## 2018-08-28 NOTE — Progress Notes (Signed)
Pre HD Assessment    08/28/18 1140  Neurological  Level of Consciousness Alert  Orientation Level Oriented X4  Respiratory  Respiratory Pattern Regular;Unlabored  Chest Assessment Chest expansion symmetrical  Bilateral Breath Sounds Diminished;Clear  Cough Dry  Cardiac  Pulse Regular  Heart Sounds S1, S2  ECG Monitor Yes  Cardiac Rhythm NSR  Vascular  R Radial Pulse +2  L Radial Pulse +2  Edema Generalized  Generalized Edema +1  Psychosocial  Psychosocial (WDL) WDL

## 2018-08-28 NOTE — Progress Notes (Signed)
Post HD assessment    08/28/18 1545  Neurological  Level of Consciousness Alert  Orientation Level Oriented X4  Respiratory  Respiratory Pattern Regular;Unlabored  Chest Assessment Chest expansion symmetrical  Bilateral Breath Sounds Diminished;Clear  Cough Dry  Cardiac  Pulse Regular  Heart Sounds S1, S2  ECG Monitor Yes  Cardiac Rhythm NSR  Vascular  R Radial Pulse +2  L Radial Pulse +2  Edema Generalized  Generalized Edema +1  Psychosocial  Psychosocial (WDL) WDL

## 2018-08-28 NOTE — Progress Notes (Signed)
Pre HD Tx   08/28/18 1145  Vital Signs  Temp 97.8 F (36.6 C)  Temp Source Oral  Pulse Rate 88  Pulse Rate Source Monitor  Resp 16  BP 126/69  BP Location Right Arm  BP Method Automatic  Patient Position (if appropriate) Lying  Oxygen Therapy  SpO2 99 %  O2 Device Nasal Cannula  O2 Flow Rate (L/min) 3 L/min  Pulse Oximetry Type Continuous  Pain Assessment  Pain Scale 0-10  Pain Score 0  Dialysis Weight  Weight 129.9 kg  Type of Weight Pre-Dialysis  Time-Out for Hemodialysis  What Procedure? HD  Pt Identifiers(min of two) First/Last Name;MRN/Account#  Correct Site? Yes  Correct Side? Yes  Correct Procedure? Yes  Consents Verified? Yes  Rad Studies Available? N/A  Safety Precautions Reviewed? Yes  Engineer, civil (consulting) Number 4  Station Number 3  UF/Alarm Test Passed  Conductivity: Meter 14  Conductivity: Machine  14  pH 7.4  Reverse Osmosis Main  Normal Saline Lot Number [id number]  Dialyzer Lot Number [id number]  Disposable Set Lot Number [id number]  Machine Temperature 98.6 F (37 C)  Musician and Audible Yes  Blood Lines Intact and Secured Yes  Pre Treatment Patient Checks  Vascular access used during treatment Fistula  Hepatitis B Surface Antigen Results Negative  Date Hepatitis B Surface Antigen Drawn 10/16/17  Hepatitis B Surface Antibody  (>10)  Date Hepatitis B Surface Antibody Drawn 08/21/18  Hemodialysis Consent Verified Yes  Hemodialysis Standing Orders Initiated Yes  ECG (Telemetry) Monitor On Yes  Prime Ordered Normal Saline  Length of  DialysisTreatment -hour(s) 3.5 Hour(s)  Dialysis Treatment Comments Na 140  Dialyzer Elisio 17H NR  Dialysate 3K, 2.5 Ca  Dialysis Anticoagulant None  Dialysate Flow Ordered 600  Blood Flow Rate Ordered 400 mL/min  Ultrafiltration Goal 2 Liters  Pre Treatment Labs Renal panel;CBC  Dialysis Blood Pressure Support Ordered Normal Saline  Education / Care Plan  Dialysis Education Provided Yes  Documented  Education in Care Plan Yes  Fistula / Graft Left Upper arm Arteriovenous fistula  No Placement Date or Time found.   Placed prior to admission: Yes  Orientation: Left  Access Location: Upper arm  Access Type: Arteriovenous fistula  Site Condition No complications  Fistula / Graft Assessment Present;Thrill;Bruit  Status Patent  Drainage Description None

## 2018-08-28 NOTE — Progress Notes (Signed)
[facility]  ROUNDING NOTE   Subjective:   [name] admitted to [facility] on 08/27/2018 for Chest wall pain [R07.89] ESRD (end stage renal disease) ([place]) [N18.6] Noncompliance [Z91.19] Nonspecific chest pain [R07.9]  Patient states that he still does not feel back to baseline Still requiring oxygen by nasal cannula   HEMODIALYSIS FLOWSHEET:  Blood Flow Rate (mL/min): 400 mL/min Arterial Pressure (mmHg): -100 mmHg Venous Pressure (mmHg): 260 mmHg Transmembrane Pressure (mmHg): 50 mmHg Ultrafiltration Rate (mL/min): 1000 mL/min Dialysate Flow Rate (mL/min): 600 ml/min Conductivity: Machine : 13.9 Conductivity: Machine : 13.9 Dialysis Fluid Bolus: Normal Saline Bolus Amount (mL): 250 mL    Objective:  Vital signs in last 24 hours:  Temp:  [97.3 F (36.3 C)-98.1 F (36.7 C)] 97.8 F (36.6 C) (02/18 1145) Pulse Rate:  [65-88] 69 (02/18 1545) Resp:  [11-22] 12 (02/18 1530) BP: (119-156)/(60-93) 124/72 (02/18 1545) SpO2:  [97 %-100 %] 98 % (02/18 1545) Weight:  [127.8 kg-129.9 kg] 127.8 kg (02/18 1545)  Weight change:  Filed Weights   08/27/18 0446 08/28/18 1145 08/28/18 1545  Weight: 124.8 kg 129.9 kg 127.8 kg    Intake/Output: I/O last 3 completed shifts: In: 859.6 [IV Piggyback:859.6] Out: 932 [IZTIW:580]   Intake/Output this shift:  No intake/output data recorded.  Physical Exam: General: No acute distress, laying in the bed  Head: Normocephalic, atraumatic. Moist oral mucosal membranes  Eyes: Anicteric  Neck: Supple, trachea midline  Lungs:   Coarse breath sounds, oxygen by nasal cannula  Heart: S1S2 no rubs  Abdomen:  Soft, nontender, bowel sounds present  Extremities: no peripheral edema.  Neurologic: Awake, alert, following commands  Skin: No lesions  Access: LUE AV Fistula    Basic Metabolic Panel: Recent Labs  Lab 08/25/18 0526 08/25/18 1850 08/27/18 0439  NA 139 137 136  K 4.2 5.0 4.6  CL 104 103 101  CO2 27 23 25    GLUCOSE 114* 166* 115*  BUN 49* 58* 66*  CREATININE 6.88* 7.06* 7.16*  CALCIUM 8.7* 8.5* 8.7*    Liver Function Tests: Recent Labs  Lab 08/25/18 0526 08/27/18 0439  AST 29 28  ALT 32 41  ALKPHOS 62 54  BILITOT 0.8 0.5  PROT 7.0 6.8  ALBUMIN 3.3* 3.3*   Recent Labs  Lab 08/25/18 0526 08/27/18 0439  LIPASE 45 61*   No results for input(s): AMMONIA in the last 168 hours.  CBC: Recent Labs  Lab 08/25/18 0526 08/25/18 1850 08/27/18 0439  WBC 7.2 6.7 12.8*  NEUTROABS 5.4  --  10.2*  HGB 9.7* 9.4* 9.6*  HCT 30.1* 28.4* 29.2*  MCV 88.8 87.7 86.6  PLT 152 147* 173    Cardiac Enzymes: Recent Labs  Lab 08/27/18 0439 08/27/18 0745 08/27/18 1850 08/28/18 0022 08/28/18 0620  TROPONINI 0.07* 0.07* 0.06* 0.06* 0.05*    BNP: Invalid input(s): POCBNP  CBG: No results for input(s): GLUCAP in the last 168 hours.  Microbiology: Results for orders placed or performed during the hospital encounter of 08/27/18  Culture, blood (routine x 2)     Status: None (Preliminary result)   Collection Time: 08/27/18  1:45 PM  Result Value Ref Range Status   Specimen Description BLOOD RIGHT ANTECUBITAL  Final   Special Requests   Final    BOTTLES DRAWN AEROBIC AND ANAEROBIC Blood Culture adequate volume   Culture   Final    NO GROWTH < 24 HOURS Performed at [facility], [address], [place], [place] [address]  Report Status PENDING  Incomplete  Culture, blood (routine x 2)     Status: None (Preliminary result)   Collection Time: 08/27/18  6:50 PM  Result Value Ref Range Status   Specimen Description BLOOD RIGHT ANTECUBITAL  Final   Special Requests   Final    BOTTLES DRAWN AEROBIC AND ANAEROBIC Blood Culture results may not be optimal due to an excessive volume of blood received in culture bottles   Culture   Final    NO GROWTH < 12 HOURS Performed at [facility], [address] [place], [place] [address]    Report Status PENDING  Incomplete     Coagulation Studies: No results for input(s): LABPROT, INR in the last 72 hours.  Urinalysis: No results for input(s): COLORURINE, LABSPEC, PHURINE, GLUCOSEU, HGBUR, BILIRUBINUR, KETONESUR, PROTEINUR, UROBILINOGEN, NITRITE, LEUKOCYTESUR in the last 72 hours.  Invalid input(s): APPERANCEUR    Imaging: Ct Angio Chest Pe W And/or Wo Contrast  Result Date: 08/27/2018 CLINICAL DATA:  Acute presentation with chest pain. History of hypertension and pulmonary emboli. Dialysis patient. EXAM: CT ANGIOGRAPHY CHEST WITH CONTRAST TECHNIQUE: Multidetector CT imaging of the chest was performed using the standard protocol during bolus administration of intravenous contrast. Multiplanar CT image reconstructions and MIPs were obtained to evaluate the vascular anatomy. CONTRAST:  69mL OMNIPAQUE IOHEXOL 350 MG/ML SOLN COMPARISON:  Chest radiography same day.  Chest CT 08/14/2018. FINDINGS: Cardiovascular: Pulmonary arterial opacification is moderate. No pulmonary emboli are seen. There is aortic atherosclerosis but no aneurysm or dissection. There is some coronary artery calcification. Heart size is mildly enlarged. No pericardial fluid. Mediastinum/Nodes: No mass or lymphadenopathy. Normal mediastinal lymph nodes. Lungs/Pleura: Patchy perihilar infiltrate on the left consistent with left lower lobe superior segment bronchopneumonia. Chronic lung cyst at the left base laterally. No pleural fluid. Upper Abdomen: Negative Musculoskeletal: Ordinary thoracic degenerative changes. No acute finding. Review of the MIP images confirms the above findings. IMPRESSION: No pulmonary emboli. Bronchopneumonia in the superior segment of the left lower lobe. Cardiomegaly. Aortic atherosclerosis. Coronary artery calcification. Electronically Signed   By: Nelson Chimes M.D.   On: 08/27/2018 11:09   Dg Chest Port 1 View  Result Date: 08/27/2018 CLINICAL DATA:  Chest pain EXAM: PORTABLE CHEST 1 VIEW COMPARISON:  Two days ago FINDINGS:  Chronic cardiomegaly and vascular pedicle widening. There is no edema, consolidation, effusion, or pneumothorax. Negative for fracture IMPRESSION: 1. No acute finding. 2. Cardiomegaly. Electronically Signed   By: Monte Fantasia M.D.   On: 08/27/2018 04:56     Medications:   . vancomycin 1,000 mg (08/28/18 1400)   . amLODipine  10 mg Oral Daily  . aspirin EC  81 mg Oral Daily  . atorvastatin  40 mg Oral QHS  . ceFEPime (MAXIPIME) IV  500 mg Intravenous Q24H  . Chlorhexidine Gluconate Cloth  6 each Topical Q0600  . clopidogrel  75 mg Oral Daily  . heparin  5,000 Units Subcutaneous Q8H  . hydrALAZINE  100 mg Oral TID  . isosorbide mononitrate  60 mg Oral Daily  . labetalol  300 mg Oral TID  . nicotine  14 mg Transdermal Daily  . oxyCODONE  20 mg Oral Q12H  . predniSONE  50 mg Oral Q breakfast  . senna-docusate  1 tablet Oral BID  . sevelamer carbonate  1,600 mg Oral TID WC  . sucralfate  1 g Oral QID  . tamsulosin  0.4 mg Oral Daily  . terazosin  5 mg Oral QHS   albuterol,  hydrALAZINE, oxyCODONE, polyethylene glycol, traZODone  Assessment/ Plan:  [name] is a 63 y.o. black male with end stage renal disease on hemodialysis, depression, hypertension, pulmonary embolism, substance abuse.   [facility] [facility]/TTS  1. Bronchopneumonia/ SOB Treatment as per hospitalist team, with broad-spectrum antibiotics vancomycin and cefepime Consider weaning off oxygen as tolerated  2. End-stage renal disease:  Patient completed his dialysis today.  Tolerated well  3.  Anemia chronic kidney disease.  With thrombocytopenia   - EPO with HD treatment  4.  Secondary hyperparathyroidism with hyperphosphatemia, -Monitor phosphorus and if elevated, start Renvela with meals.    LOS: 0 Britani Beattie 2/18/20204:28 PM

## 2018-08-28 NOTE — Progress Notes (Signed)
HD Tx completed, tolerated well, UF goal met.    08/28/18 1530  Vital Signs  Pulse Rate 71  Resp 12  BP 128/75  Oxygen Therapy  SpO2 98 %  During Hemodialysis Assessment  HD Safety Checks Performed Yes  KECN 63.9 KECN  Dialysis Fluid Bolus Normal Saline  Bolus Amount (mL) 250 mL  Intra-Hemodialysis Comments Tx completed

## 2018-08-29 LAB — LEGIONELLA PNEUMOPHILA SEROGP 1 UR AG: L. pneumophila Serogp 1 Ur Ag: NEGATIVE

## 2018-08-29 LAB — HIV ANTIBODY (ROUTINE TESTING W REFLEX): HIV Screen 4th Generation wRfx: NONREACTIVE

## 2018-08-29 MED ORDER — PHENOL 1.4 % MT LIQD
1.0000 | OROMUCOSAL | Status: DC | PRN
  Administered 2018-08-29: 1 via OROMUCOSAL
  Filled 2018-08-29: qty 177

## 2018-08-29 MED ORDER — ONDANSETRON HCL 4 MG/2ML IJ SOLN
4.0000 mg | Freq: Four times a day (QID) | INTRAMUSCULAR | Status: DC | PRN
  Administered 2018-08-29: 4 mg via INTRAVENOUS
  Filled 2018-08-29: qty 2

## 2018-08-29 MED ORDER — BENZONATATE 100 MG PO CAPS
100.0000 mg | ORAL_CAPSULE | Freq: Three times a day (TID) | ORAL | Status: DC | PRN
  Administered 2018-08-29: 100 mg via ORAL
  Filled 2018-08-29: qty 1

## 2018-08-29 NOTE — Progress Notes (Signed)
[facility] [place] NAME: [name]    MR#:  [id number]  DATE OF BIRTH:  Oct 30, 1955  SUBJECTIVE: Patient admitted for shortness of breath and found to have elevated d-dimer so we did CT angios chest negative for PE but found out he has bronchopneumonia.  Patient does have some cough and shortness of breath even though he is not hypoxic.  Complains of sore throat today.  Also has nausea.  CHIEF COMPLAINT:   Chief Complaint  Patient presents with  . Chest Pain    REVIEW OF SYSTEMS:   ROS CONSTITUTIONAL:Patient feels short of breath and also tired has hoarseness and throat discomfort. EYES: No blurred or double vision.  EARS, NOSE, AND THROAT: No tinnitus or ear pain.  RESPIRATORY: Has mild cough, shortness of breath.Marland Kitchen  CARDIOVASCULAR: No chest pain, orthopnea, edema.  GASTROINTESTINAL: No nausea, vomiting, diarrhea or abdominal pain.  GENITOURINARY: No dysuria, hematuria.  ENDOCRINE: No polyuria, nocturia,  HEMATOLOGY: No anemia, easy bruising or bleeding SKIN: No rash or lesion. MUSCULOSKELETAL: No joint pain or arthritis.   NEUROLOGIC: No tingling, numbness, weakness.  PSYCHIATRY: No anxiety or depression.   DRUG ALLERGIES:   Allergies  Allergen Reactions  . Cyclobenzaprine Nausea Only  . Clonidine Other (See Comments)    Asymptomatic bradycardia to 38  . Furosemide Other (See Comments)    Caused renal failure  . Tylenol [Acetaminophen] Other (See Comments)    Reaction:  Pt states it bothers his liver    VITALS:  Blood pressure 136/83, pulse 82, temperature 98.8 F (37.1 C), temperature source Oral, resp. rate 20, height 6' (1.829 m), weight 127.8 kg, SpO2 96 %.  PHYSICAL EXAMINATION:  GENERAL:  63 y.o.-year-old patient lying in the bed with no acute distress.  EYES: Pupils equal, round, reactive to light and accommodation. No scleral icterus. Extraocular muscles intact.  HEENT: Head atraumatic, normocephalic.  Oropharynx and nasopharynx clear.  NECK:  Supple, no jugular venous distention. No thyroid enlargement, no tenderness.  LUNGS: \Diminished breath sounds at the bases.  CARDIOVASCULAR: S1, S2 normal. No murmurs, rubs, or gallops.  ABDOMEN: Soft, nontender, nondistended. Bowel sounds present. No organomegaly or mass.  EXTREMITIES: No pedal edema, cyanosis, or clubbing.  NEUROLOGIC: Cranial nerves II through XII are intact. Muscle strength 5/5 in all extremities. Sensation intact. Gait not checked.  PSYCHIATRIC: The patient is alert and oriented x 3.  SKIN: No obvious rash, lesion, or ulcer.    LABORATORY PANEL:   CBC Recent Labs  Lab 08/27/18 0439  WBC 12.8*  HGB 9.6*  HCT 29.2*  PLT 173   ------------------------------------------------------------------------------------------------------------------  Chemistries  Recent Labs  Lab 08/27/18 0439  NA 136  K 4.6  CL 101  CO2 25  GLUCOSE 115*  BUN 66*  CREATININE 7.16*  CALCIUM 8.7*  AST 28  ALT 41  ALKPHOS 54  BILITOT 0.5   ------------------------------------------------------------------------------------------------------------------  Cardiac Enzymes Recent Labs  Lab 08/28/18 0620  TROPONINI 0.05*   ------------------------------------------------------------------------------------------------------------------  RADIOLOGY:  No results found.  EKG:   Orders placed or performed during the hospital encounter of 08/27/18  . EKG 12-Lead  . EKG 12-Lead    ASSESSMENT AND PLAN:   63 year old male with ESRD on hemodialysis, recurrent medical admissions with shortness of breath admitted again yesterday for shortness of breath, found to have bronchopneumonia. 1.  Bronchopneumonia, antibiotics were changed to Vanco and cefepime, MRSA screen is negative so discontinue vancomycin, continue cefepime.   Current admissions, patient was here  recently, now he feels he is short of breath, hoarse voice, wants to stay until  he is better as he got admitted again and does not want to go home soon.  Use Tessalon Perle, use Zofran for nausea.  2.  COPD: No exacerbation.  Patient to continue bronchodilators as needed. 3.  ESRD on hemodialysis Tuesday, Thursday, Saturday.  Nephrology is following.  Likely discharge home tomorrow after dialysis. 4.  CAD: Chest no chest pain.  Continue aspirin, Plavix, Imdur. 5..  Chronic pain: Patient on OxyContin, oxycodone. 6.  BPH: Continue Flomax.   More than 50% time spent in counseling, coordination of care  All the records are reviewed and case discussed with Care Management/Social Workerr. Management plans discussed with the patient, family and they are in agreement.  CODE STATUS: full  TOTAL TIME TAKING CARE OF THIS PATIENT: 35 minutes.   Possible discharge tomorrow.   Epifanio Lesches M.D on 08/29/2018 at 11:20 AM  Between 7am to 6pm - Pager - [phone]  After 6pm go to [email or web address] - password [email or web address] [facility]  Office  [phone]  CC: Primary care physician; Patient, No Pcp Per   Note: This dictation was prepared with Dragon dictation along with smaller phrase technology. Any transcriptional errors that result from this process are unintentional.

## 2018-08-30 LAB — RENAL FUNCTION PANEL
Albumin: 3.2 g/dL — ABNORMAL LOW (ref 3.5–5.0)
Anion gap: 11 (ref 5–15)
BUN: 77 mg/dL — ABNORMAL HIGH (ref 6–20)
CHLORIDE: 100 mmol/L (ref 98–111)
CO2: 23 mmol/L (ref 22–32)
Calcium: 8.3 mg/dL — ABNORMAL LOW (ref 8.9–10.3)
Creatinine, Ser: 7.56 mg/dL — ABNORMAL HIGH (ref 0.61–1.24)
GFR calc Af Amer: 8 mL/min — ABNORMAL LOW (ref >60)
GFR calc non Af Amer: 7 mL/min — ABNORMAL LOW (ref >60)
Glucose, Bld: 115 mg/dL — ABNORMAL HIGH (ref 70–99)
Phosphorus: 4.9 mg/dL — ABNORMAL HIGH (ref 2.5–4.6)
Potassium: 4.5 mmol/L (ref 3.5–5.1)
Sodium: 134 mmol/L — ABNORMAL LOW (ref 135–145)

## 2018-08-30 LAB — CBC
HCT: 28.2 % — ABNORMAL LOW (ref 39.0–52.0)
HEMATOCRIT: 27.7 % — AB (ref 39.0–52.0)
Hemoglobin: 9.1 g/dL — ABNORMAL LOW (ref 13.0–17.0)
Hemoglobin: 9.2 g/dL — ABNORMAL LOW (ref 13.0–17.0)
MCH: 28.4 pg (ref 26.0–34.0)
MCH: 29.4 pg (ref 26.0–34.0)
MCHC: 32.3 g/dL (ref 30.0–36.0)
MCHC: 33.2 g/dL (ref 30.0–36.0)
MCV: 88.1 fL (ref 80.0–100.0)
MCV: 88.5 fL (ref 80.0–100.0)
Platelets: 156 10*3/uL (ref 150–400)
Platelets: 154 10*3/uL (ref 150–400)
RBC: 3.2 MIL/uL — ABNORMAL LOW (ref 4.22–5.81)
RBC: 3.13 MIL/uL — ABNORMAL LOW (ref 4.22–5.81)
RDW: 14.9 % (ref 11.5–15.5)
RDW: 15.4 % (ref 11.5–15.5)
WBC: 8.5 10*3/uL (ref 4.0–10.5)
WBC: 8.6 10*3/uL (ref 4.0–10.5)
nRBC: 0 % (ref 0.0–0.2)
nRBC: 0 % (ref 0.0–0.2)

## 2018-08-30 MED ORDER — NICOTINE 14 MG/24HR TD PT24: 14.0000 mg | MEDICATED_PATCH | Freq: Every day | TRANSDERMAL | 0 refills | Status: DC

## 2018-08-30 MED ORDER — CEFDINIR 300 MG PO CAPS: 300.0000 mg | ORAL_CAPSULE | Freq: Two times a day (BID) | ORAL | 0 refills | Status: DC

## 2018-08-30 MED ORDER — ALBUTEROL SULFATE HFA 108 (90 BASE) MCG/ACT IN AERS: 2.0000 | INHALATION_SPRAY | Freq: Four times a day (QID) | RESPIRATORY_TRACT | 0 refills | Status: DC | PRN

## 2018-08-30 MED ORDER — BENZONATATE 100 MG PO CAPS: 100.0000 mg | ORAL_CAPSULE | Freq: Three times a day (TID) | ORAL | 0 refills | Status: DC | PRN

## 2018-08-30 NOTE — Progress Notes (Signed)
HD Tx End   08/30/18 1330  Vital Signs  Pulse Rate 67  Resp 12  BP (!) 142/79  Oxygen Therapy  SpO2 100 %  During Hemodialysis Assessment  Blood Flow Rate (mL/min) 400 mL/min  Arterial Pressure (mmHg) -190 mmHg  Venous Pressure (mmHg) 170 mmHg  Transmembrane Pressure (mmHg) 60 mmHg  Ultrafiltration Rate (mL/min) 720 mL/min  Dialysate Flow Rate (mL/min) 600 ml/min  Conductivity: Machine  14  HD Safety Checks Performed Yes  Intra-Hemodialysis Comments Tolerated well;Tx completed

## 2018-08-30 NOTE — Discharge Summary (Signed)
[facility] NAME: Antonius Hartlage    MR#:  [id number]  DATE OF BIRTH:  [birth year]  DATE OF ADMISSION:  08/27/2018 ADMITTING PHYSICIAN: Avel Peace Salary, MD  DATE OF DISCHARGE: No discharge date for patient encounter.  PRIMARY CARE PHYSICIAN: Patient, No Pcp Per    ADMISSION DIAGNOSIS:  Chest wall pain [R07.89] ESRD (end stage renal disease) ([place]) [N18.6] Noncompliance [Z91.19] Nonspecific chest pain [R07.9]  DISCHARGE DIAGNOSIS:  Active Problems:   HCAP (healthcare-associated pneumonia)   SECONDARY DIAGNOSIS:   Past Medical History:  Diagnosis Date  . Depression   . Heart attack ([place])   . Hypertension   . PE (pulmonary embolism)   . Renal disorder     HOSPITAL COURSE:  63 year old male with ESRD on hemodialysis, recurrent medical admissions with shortness of breath admitted again yesterday for shortness of breath, found to have bronchopneumonia  *Bronchopneumonia Resolving Treated on our pneumonia protocol, provided empiric cefepime, cultures negative  *COPD without exacerbation  Stable  Breathing treatments PRN   *End-stage renal disease  Nephrology consulted for hemodialysis needs, did receive hemodialysis today at discharge   *CAD Stable Continue aspirin, Plavix, Imdur  *Chronic pain syndrome Stable on home regiment  *BPH continue Flomax.   DISCHARGE CONDITIONS:   stable  CONSULTS OBTAINED:  Treatment Team:  Murlean Iba, MD  DRUG ALLERGIES:   Allergies  Allergen Reactions  . Cyclobenzaprine Nausea Only  . Clonidine Other (See Comments)    Asymptomatic bradycardia to 38  . Furosemide Other (See Comments)    Caused renal failure  . Tylenol [Acetaminophen] Other (See Comments)    Reaction:  Pt states it bothers his liver    DISCHARGE MEDICATIONS:   Allergies as of 08/30/2018      Reactions   Cyclobenzaprine Nausea Only   Clonidine Other (See Comments)   Asymptomatic  bradycardia to 38   Furosemide Other (See Comments)   Caused renal failure   Tylenol [acetaminophen] Other (See Comments)   Reaction:  Pt states it bothers his liver      Medication List    TAKE these medications   albuterol 108 (90 Base) MCG/ACT inhaler Commonly known as:  PROVENTIL HFA;VENTOLIN HFA Inhale 2 puffs into the lungs every 6 (six) hours as needed for wheezing or shortness of breath.   amLODipine 10 MG tablet Commonly known as:  NORVASC Take 10 mg by mouth daily.   aspirin EC 81 MG tablet Take 81 mg by mouth daily.   atorvastatin 40 MG tablet Commonly known as:  LIPITOR Take 40 mg by mouth at bedtime.   benzonatate 100 MG capsule Commonly known as:  TESSALON Take 1 capsule (100 mg total) by mouth 3 (three) times daily as needed for cough.   cefdinir 300 MG capsule Commonly known as:  OMNICEF Take 1 capsule (300 mg total) by mouth 2 (two) times daily.   clopidogrel 75 MG tablet Commonly known as:  PLAVIX Take 75 mg by mouth daily.   hydrALAZINE 100 MG tablet Commonly known as:  APRESOLINE Take 1 tablet (100 mg total) by mouth 3 (three) times daily.   isosorbide mononitrate 60 MG 24 hr tablet Commonly known as:  IMDUR Take 1 tablet (60 mg total) by mouth daily.   labetalol 300 MG tablet Commonly known as:  NORMODYNE Take 1 tablet (300 mg total) by mouth 3 (three) times daily.   nicotine 14 mg/24hr patch Commonly known as:  NICODERM CQ - dosed in  mg/24 hours Place 1 patch (14 mg total) onto the skin daily. Start taking on:  August 31, 2018   oxyCODONE 20 mg 12 hr tablet Commonly known as:  OXYCONTIN Take 20 mg by mouth every 12 (twelve) hours.   Oxycodone HCl 10 MG Tabs Take 10-20 mg by mouth every 4 (four) hours as needed (for pain).   polyethylene glycol packet Commonly known as:  MIRALAX / GLYCOLAX Take 17 g by mouth daily as needed for mild constipation.   predniSONE 50 MG tablet Commonly known as:  DELTASONE Take 1 tablet (50 mg  total) by mouth daily with breakfast.   senna-docusate 8.6-50 MG tablet Commonly known as:  Senokot-S Take 1 tablet by mouth 2 (two) times daily.   sevelamer carbonate 800 MG tablet Commonly known as:  RENVELA Take 2 tablets (1,600 mg total) by mouth 3 (three) times daily with meals.   sucralfate 1 g tablet Commonly known as:  CARAFATE Take 1 tablet (1 g total) by mouth 4 (four) times daily.   tamsulosin 0.4 MG Caps capsule Commonly known as:  FLOMAX Take 1 capsule (0.4 mg total) by mouth daily.   terazosin 5 MG capsule Commonly known as:  HYTRIN Take 1 capsule (5 mg total) by mouth at bedtime.   traZODone 100 MG tablet Commonly known as:  DESYREL Take 100 mg by mouth at bedtime as needed for sleep.        DISCHARGE INSTRUCTIONS:  If you experience worsening of your admission symptoms, develop shortness of breath, life threatening emergency, suicidal or homicidal thoughts you must seek medical attention immediately by calling 911 or calling your MD immediately  if symptoms less severe.  You Must read complete instructions/literature along with all the possible adverse reactions/side effects for all the Medicines you take and that have been prescribed to you. Take any new Medicines after you have completely understood and accept all the possible adverse reactions/side effects.   Please note  You were cared for by a hospitalist during your hospital stay. If you have any questions about your discharge medications or the care you received while you were in the hospital after you are discharged, you can call the unit and asked to speak with the hospitalist on call if the hospitalist that took care of you is not available. Once you are discharged, your primary care physician will handle any further medical issues. Please note that NO REFILLS for any discharge medications will be authorized once you are discharged, as it is imperative that you return to your primary care physician (or  establish a relationship with a primary care physician if you do not have one) for your aftercare needs so that they can reassess your need for medications and monitor your lab values.    Today   CHIEF COMPLAINT:   Chief Complaint  Patient presents with  . Chest Pain    HISTORY OF PRESENT ILLNESS:   63 y.o. male with a known history per below which includes noncompliance with medical management was discharged from the hospital 2 days ago for COPD exacerbation, did receive hemodialysis at that time for end-stage renal disease, presents again with chest pain, in the emergency room patient was found to have elevated d-dimer, white count 12,000-on steroids, CT chest noted for left-sided pneumonia, hospitalist asked to admit for further care, patient currently in no apparent distress, resting comfortably in bed, patient is now been admitted for acute left HCAP, acute on chronic elevated troponins most likely secondary to demand  ischemia/end-stage renal disease, and acute on chronic pain syndrome.  VITAL SIGNS:  Blood pressure 129/76, pulse 66, temperature 97.8 F (36.6 C), temperature source Oral, resp. rate 10, height 6' (1.829 m), weight 130 kg, SpO2 100 %.  I/O:    Intake/Output Summary (Last 24 hours) at 08/30/2018 1145 Last data filed at 08/30/2018 0647 Gross per 24 hour  Intake 900 ml  Output 500 ml  Net 400 ml    PHYSICAL EXAMINATION:  GENERAL:  62 y.o.-year-old patient lying in the bed with no acute distress.  EYES: Pupils equal, round, reactive to light and accommodation. No scleral icterus. Extraocular muscles intact.  HEENT: Head atraumatic, normocephalic. Oropharynx and nasopharynx clear.  NECK:  Supple, no jugular venous distention. No thyroid enlargement, no tenderness.  LUNGS: Normal breath sounds bilaterally, no wheezing, rales,rhonchi or crepitation. No use of accessory muscles of respiration.  CARDIOVASCULAR: S1, S2 normal. No murmurs, rubs, or gallops.  ABDOMEN:  Soft, non-tender, non-distended. Bowel sounds present. No organomegaly or mass.  EXTREMITIES: No pedal edema, cyanosis, or clubbing.  NEUROLOGIC: Cranial nerves II through XII are intact. Muscle strength 5/5 in all extremities. Sensation intact. Gait not checked.  PSYCHIATRIC: The patient is alert and oriented x 3.  SKIN: No obvious rash, lesion, or ulcer.   DATA REVIEW:   CBC Recent Labs  Lab 08/30/18 0354  WBC 8.5  HGB 9.1*  HCT 28.2*  PLT 156    Chemistries  Recent Labs  Lab 08/27/18 0439  NA 136  K 4.6  CL 101  CO2 25  GLUCOSE 115*  BUN 66*  CREATININE 7.16*  CALCIUM 8.7*  AST 28  ALT 41  ALKPHOS 54  BILITOT 0.5    Cardiac Enzymes Recent Labs  Lab 08/28/18 0620  TROPONINI 0.05*    Microbiology Results  Results for orders placed or performed during the hospital encounter of 08/27/18  Culture, blood (routine x 2)     Status: None (Preliminary result)   Collection Time: 08/27/18  1:45 PM  Result Value Ref Range Status   Specimen Description BLOOD RIGHT ANTECUBITAL  Final   Special Requests   Final    BOTTLES DRAWN AEROBIC AND ANAEROBIC Blood Culture adequate volume   Culture   Final    NO GROWTH 3 DAYS Performed at [facility], [address] [place], [place] [address]    Report Status PENDING  Incomplete  Culture, blood (routine x 2)     Status: None (Preliminary result)   Collection Time: 08/27/18  6:50 PM  Result Value Ref Range Status   Specimen Description BLOOD RIGHT ANTECUBITAL  Final   Special Requests   Final    BOTTLES DRAWN AEROBIC AND ANAEROBIC Blood Culture results may not be optimal due to an excessive volume of blood received in culture bottles   Culture   Final    NO GROWTH 3 DAYS Performed at [facility], [address], [place], [place] [address]    Report Status PENDING  Incomplete    RADIOLOGY:  No results found.  EKG:   Orders placed or performed during the hospital encounter of 08/27/18  . EKG  12-Lead  . EKG 12-Lead      Management plans discussed with the patient, family and they are in agreement.  CODE STATUS:     Code Status Orders  (From admission, onward)         Start     Ordered   08/27/18 1828  Full code  Continuous  08/27/18 1828        Code Status History    Date Active Date Inactive Code Status Order ID Comments User Context   08/25/2018 0802 08/26/2018 1333 Full Code [id number]  Sela Hua, MD ED   08/20/2018 0400 08/21/2018 1823 Full Code [id number]  Harrie Foreman, MD Inpatient   08/15/2018 0058 08/18/2018 1624 Full Code [id number]  Lance Coon, MD Inpatient   09/21/2016 1519 09/27/2016 2042 Full Code [id number]  Hillary Bow, MD ED   04/28/2016 2325 04/29/2016 2043 Full Code [id number]  Muthersbaugh, Gwenlyn Perking ED   01/18/2016 0202 01/18/2016 1848 Full Code [id number]  Quintella Baton, MD Inpatient      TOTAL TIME TAKING CARE OF THIS PATIENT: 40 minutes.    Avel Peace Salary M.D on 08/30/2018 at 11:45 AM  Between 7am to 6pm - Pager - [phone]  After 6pm go to [email or web address] - password [email or web address]  Office  [phone]  CC: Primary care physician; Patient, No Pcp Per   Note: This dictation was prepared with Dragon dictation along with smaller phrase technology. Any transcriptional errors that result from this process are unintentional.

## 2018-08-30 NOTE — Progress Notes (Signed)
Pre HD Assessment   08/30/18 0955  Neurological  Level of Consciousness Alert  Orientation Level Oriented X4  Respiratory  Respiratory Pattern Regular;Unlabored  Chest Assessment Chest expansion symmetrical  Bilateral Breath Sounds Diminished  Cardiac  ECG Monitor Yes  Cardiac Rhythm NSR  Vascular  R Radial Pulse +2  L Radial Pulse +2  Integumentary  Integumentary (WDL) X  Skin Color Appropriate for ethnicity  Additional Integumentary Comments  (HD Access AVF left arm)  Musculoskeletal  Musculoskeletal (WDL) X  Generalized Weakness Yes  Psychosocial  Psychosocial (WDL) WDL

## 2018-08-30 NOTE — Care Management (Signed)
[name] dialysis liaison notified of discharge  

## 2018-08-30 NOTE — Progress Notes (Signed)
HD Tx End   08/30/18 1345  Hand-Off documentation  Report given to (Full Name) Cory Roughen RN   Report received from (Full Name) Trellis Paganini RN  Vital Signs  Temp 98.5 F (36.9 C)  Temp Source Oral  Pulse Rate 76  Pulse Rate Source Monitor  Resp 12  BP (!) 147/90  BP Location Right Arm  BP Method Automatic  Patient Position (if appropriate) Lying  Oxygen Therapy  SpO2 99 %  Post-Hemodialysis Assessment  Rinseback Volume (mL) 250 mL  KECN 78.7 V  Dialyzer Clearance Lightly streaked  Duration of HD Treatment -hour(s) 3.5 hour(s)  Hemodialysis Intake (mL) 500 mL  UF Total -Machine (mL) 2500 mL  Net UF (mL) 2000 mL  Tolerated HD Treatment Yes  AVG/AVF Arterial Site Held (minutes) 10 minutes  AVG/AVF Venous Site Held (minutes) 10 minutes  Fistula / Graft Left Upper arm Arteriovenous fistula  No Placement Date or Time found.   Placed prior to admission: Yes  Orientation: Left  Access Location: Upper arm  Access Type: Arteriovenous fistula  Site Condition No complications  Fistula / Graft Assessment Present;Thrill;Bruit  Status Deaccessed  Drainage Description None

## 2018-08-30 NOTE — Sepsis Progress Note (Signed)
The patient left after his discharged information was provided. He did not take his prescriptions with him as the MD had not printed them out. The patient indicated left as he indicates his ride was waiting for him outside. Dr. Jerelyn Charles has been notified. About the patient leaving without his prescriptions.

## 2018-08-30 NOTE — Progress Notes (Signed)
HD Post Tx Assessment   08/30/18 1408  Neurological  Level of Consciousness Alert  Orientation Level Oriented X4  Respiratory  Respiratory Pattern Regular;Unlabored  Chest Assessment Chest expansion symmetrical  Bilateral Breath Sounds Diminished  Cardiac  ECG Monitor Yes  Cardiac Rhythm NSR  Vascular  R Radial Pulse +2  L Radial Pulse +2  Integumentary  Integumentary (WDL) X  Skin Color Appropriate for ethnicity  Musculoskeletal  Musculoskeletal (WDL) X  Generalized Weakness Yes  Psychosocial  Psychosocial (WDL) WDL

## 2018-09-01 LAB — CULTURE, BLOOD (ROUTINE X 2)
Culture: NO GROWTH
Culture: NO GROWTH
Special Requests: ADEQUATE

## 2018-11-13 ENCOUNTER — Other Ambulatory Visit: Payer: Self-pay

## 2018-11-13 ENCOUNTER — Emergency Department: Payer: Medicare Other

## 2018-11-13 ENCOUNTER — Inpatient Hospital Stay
Admission: EM | Admit: 2018-11-13 | Discharge: 2018-11-16 | DRG: 640 | Disposition: A | Discharge status: Home / Self Care | Payer: Medicare Other | Attending: Internal Medicine | Admitting: Internal Medicine

## 2018-11-13 DIAGNOSIS — Z992 Dependence on renal dialysis: Secondary | ICD-10-CM

## 2018-11-13 DIAGNOSIS — Z1159 Encounter for screening for other viral diseases: Secondary | ICD-10-CM

## 2018-11-13 DIAGNOSIS — F602 Antisocial personality disorder: Secondary | ICD-10-CM | POA: Diagnosis present

## 2018-11-13 DIAGNOSIS — Z86711 Personal history of pulmonary embolism: Secondary | ICD-10-CM

## 2018-11-13 DIAGNOSIS — J9611 Chronic respiratory failure with hypoxia: Secondary | ICD-10-CM | POA: Diagnosis present

## 2018-11-13 DIAGNOSIS — I2510 Atherosclerotic heart disease of native coronary artery without angina pectoris: Secondary | ICD-10-CM | POA: Diagnosis present

## 2018-11-13 DIAGNOSIS — E8770 Fluid overload, unspecified: Secondary | ICD-10-CM | POA: Diagnosis not present

## 2018-11-13 DIAGNOSIS — I252 Old myocardial infarction: Secondary | ICD-10-CM

## 2018-11-13 DIAGNOSIS — Z9119 Patient's noncompliance with other medical treatment and regimen: Secondary | ICD-10-CM

## 2018-11-13 DIAGNOSIS — Z9115 Patient's noncompliance with renal dialysis: Secondary | ICD-10-CM

## 2018-11-13 DIAGNOSIS — N400 Enlarged prostate without lower urinary tract symptoms: Secondary | ICD-10-CM | POA: Diagnosis present

## 2018-11-13 DIAGNOSIS — E8779 Other fluid overload: Secondary | ICD-10-CM

## 2018-11-13 DIAGNOSIS — N2581 Secondary hyperparathyroidism of renal origin: Secondary | ICD-10-CM | POA: Diagnosis present

## 2018-11-13 DIAGNOSIS — I120 Hypertensive chronic kidney disease with stage 5 chronic kidney disease or end stage renal disease: Secondary | ICD-10-CM | POA: Diagnosis present

## 2018-11-13 DIAGNOSIS — F329 Major depressive disorder, single episode, unspecified: Secondary | ICD-10-CM | POA: Diagnosis present

## 2018-11-13 DIAGNOSIS — D696 Thrombocytopenia, unspecified: Secondary | ICD-10-CM | POA: Diagnosis present

## 2018-11-13 DIAGNOSIS — Z79899 Other long term (current) drug therapy: Secondary | ICD-10-CM

## 2018-11-13 DIAGNOSIS — Z7902 Long term (current) use of antithrombotics/antiplatelets: Secondary | ICD-10-CM

## 2018-11-13 DIAGNOSIS — N186 End stage renal disease: Secondary | ICD-10-CM | POA: Diagnosis not present

## 2018-11-13 DIAGNOSIS — Z7952 Long term (current) use of systemic steroids: Secondary | ICD-10-CM

## 2018-11-13 DIAGNOSIS — Z79891 Long term (current) use of opiate analgesic: Secondary | ICD-10-CM

## 2018-11-13 DIAGNOSIS — Z91158 Patient's noncompliance with renal dialysis for other reason: Secondary | ICD-10-CM

## 2018-11-13 DIAGNOSIS — Z7901 Long term (current) use of anticoagulants: Secondary | ICD-10-CM

## 2018-11-13 DIAGNOSIS — F17210 Nicotine dependence, cigarettes, uncomplicated: Secondary | ICD-10-CM | POA: Diagnosis present

## 2018-11-13 DIAGNOSIS — Z7982 Long term (current) use of aspirin: Secondary | ICD-10-CM

## 2018-11-13 DIAGNOSIS — Z96649 Presence of unspecified artificial hip joint: Secondary | ICD-10-CM | POA: Diagnosis present

## 2018-11-13 DIAGNOSIS — D631 Anemia in chronic kidney disease: Secondary | ICD-10-CM | POA: Diagnosis present

## 2018-11-13 NOTE — ED Provider Notes (Signed)
[facility] Emergency Department Provider Note    First MD Initiated Contact with Patient 11/13/18 2332     (approximate)  I have reviewed the triage vital signs and the nursing notes.   HISTORY  Chief Complaint Shortness of Breath    HPI [name] is a 63 y.o. male with below list of previous medical conditions including renal disease currently on dialysis Tuesday Thursday Saturday pulmonary emboli hypertension MI presents to the emergency department with progressive dyspnea over the past 3 days.  Patient states that he has not been to dialysis in 2 weeks.  Patient states that he received his dialysis in [place] and is never had dialysis here in [place] for "I need to get set up here".  Patient denies any chest pain.  Patient denies any nausea vomiting diarrhea constipation.       Past Medical History:  Diagnosis Date  . Depression   . Heart attack ([place])   . Hypertension   . PE (pulmonary embolism)   . Renal disorder     Patient Active Problem List   Diagnosis Date Noted  . ESRD (end stage renal disease) on dialysis ([place]) 11/14/2018  . HCAP (healthcare-associated pneumonia) 08/27/2018  . Shortness of breath 08/25/2018  . Chest pain 08/20/2018  . ESRD on dialysis ([place]) 11/08/2016  . Prostate cancer screening 11/08/2016  . Acquired cyst of kidney 11/08/2016  . Urinary urgency 11/08/2016  . Acute on chronic renal failure ([place]) 09/23/2016  . Hypertensive urgency 09/21/2016  . Dysthymia 02/26/2016  . Chronic pain 02/26/2016  . Noncompliance 02/26/2016  . Opiate abuse, continuous ([place]) 02/03/2016  . Substance induced mood disorder ([place]) 02/03/2016  . Antisocial personality disorder ([place]) 02/03/2016  . Left-sided weakness 01/18/2016  . HEPATITIS C 06/28/2008  . HLD (hyperlipidemia) 06/28/2008  . SUBSTANCE ABUSE, MULTIPLE 06/28/2008  . Hypertension 06/27/2008    Past Surgical History:  Procedure Laterality Date  . TOTAL HIP  ARTHROPLASTY      Prior to Admission medications   Medication Sig Start Date End Date Taking? Authorizing Provider  albuterol (PROVENTIL HFA;VENTOLIN HFA) 108 (90 Base) MCG/ACT inhaler Inhale 2 puffs into the lungs every 6 (six) hours as needed for wheezing or shortness of breath. 08/30/18   Salary, Holly Bodily D, MD  amLODipine (NORVASC) 10 MG tablet Take 10 mg by mouth daily. 11/27/15   [provider]  aspirin EC 81 MG tablet Take 81 mg by mouth daily.    [provider]  atorvastatin (LIPITOR) 40 MG tablet Take 40 mg by mouth at bedtime.  11/27/15   [provider]  benzonatate (TESSALON) 100 MG capsule Take 1 capsule (100 mg total) by mouth 3 (three) times daily as needed for cough. 08/30/18   Salary, Avel Peace, MD  cefdinir (OMNICEF) 300 MG capsule Take 1 capsule (300 mg total) by mouth 2 (two) times daily. 08/30/18   Salary, Holly Bodily D, MD  clopidogrel (PLAVIX) 75 MG tablet Take 75 mg by mouth daily. 06/21/16   [provider]  hydrALAZINE (APRESOLINE) 100 MG tablet Take 1 tablet (100 mg total) by mouth 3 (three) times daily. 09/27/16   Gladstone Lighter, MD  isosorbide mononitrate (IMDUR) 60 MG 24 hr tablet Take 1 tablet (60 mg total) by mouth daily. 09/28/16   Gladstone Lighter, MD  labetalol (NORMODYNE) 300 MG tablet Take 1 tablet (300 mg total) by mouth 3 (three) times daily. 09/27/16   Gladstone Lighter, MD  nicotine (NICODERM CQ - DOSED IN MG/24 HOURS)  14 mg/24hr patch Place 1 patch (14 mg total) onto the skin daily. 08/31/18   Salary, Holly Bodily D, MD  oxyCODONE (OXYCONTIN) 20 mg 12 hr tablet Take 20 mg by mouth every 12 (twelve) hours. 04/11/16   [provider]  Oxycodone HCl 10 MG TABS Take 10-20 mg by mouth every 4 (four) hours as needed (for pain).  04/11/16   [provider]  polyethylene glycol (MIRALAX / GLYCOLAX) packet Take 17 g by mouth daily as needed for mild constipation.  11/27/15   [provider]  predniSONE (DELTASONE)  50 MG tablet Take 1 tablet (50 mg total) by mouth daily with breakfast. 08/26/18   Mayo, Pete Pelt, MD  senna-docusate (SENOKOT-S) 8.6-50 MG tablet Take 1 tablet by mouth 2 (two) times daily. 09/27/16   Gladstone Lighter, MD  sevelamer carbonate (RENVELA) 800 MG tablet Take 2 tablets (1,600 mg total) by mouth 3 (three) times daily with meals. 08/15/18   Mayo, Pete Pelt, MD  sucralfate (CARAFATE) 1 g tablet Take 1 tablet (1 g total) by mouth 4 (four) times daily. 05/29/16   Carrie Mew, MD  tamsulosin (FLOMAX) 0.4 MG CAPS capsule Take 1 capsule (0.4 mg total) by mouth daily. 09/27/16   Gladstone Lighter, MD  terazosin (HYTRIN) 5 MG capsule Take 1 capsule (5 mg total) by mouth at bedtime. 09/27/16   Gladstone Lighter, MD  traZODone (DESYREL) 100 MG tablet Take 100 mg by mouth at bedtime as needed for sleep.  10/22/15   [provider]    Allergies Cyclobenzaprine; Clonidine; Furosemide; and Tylenol [acetaminophen]  No family history on file.  Social History Social History   Tobacco Use  . Smoking status: Current Every Day Smoker    Packs/day: 0.50    Types: Cigarettes  . Smokeless tobacco: Never Used  Substance Use Topics  . Alcohol use: No  . Drug use: No    Comment: pt has been positive for cocaine twice this week but denies use    Review of Systems Constitutional: No fever/chills Eyes: No visual changes. ENT: No sore throat. Cardiovascular: Denies chest pain. Respiratory: Positive for shortness of breath. Gastrointestinal: No abdominal pain.  No nausea, no vomiting.  No diarrhea.  No constipation. Genitourinary: Negative for dysuria. Musculoskeletal: Negative for neck pain.  Negative for back pain. Integumentary: Negative for rash. Neurological: Negative for headaches, focal weakness or numbness.  ____________________________________________   PHYSICAL EXAM:  VITAL SIGNS: ED Triage Vitals  Enc Vitals Group     BP 11/13/18 2329 (!) 166/112     Pulse Rate  11/13/18 2329 84     Resp 11/13/18 2329 (!) 21     Temp 11/13/18 2329 98.2 F (36.8 C)     Temp Source 11/13/18 2329 Oral     SpO2 11/13/18 2329 100 %     Weight 11/13/18 2330 136.1 kg (300 lb)     Height 11/13/18 2330 1.829 m (6')     Head Circumference --      Peak Flow --      Pain Score 11/13/18 2329 8     Pain Loc --      Pain Edu? --      Excl. in [place]? --     Constitutional: Alert and oriented. Well appearing and in no acute distress. Eyes: Conjunctivae are normal.  Mouth/Throat: Mucous membranes are moist. Oropharynx non-erythematous. Neck: No stridor.  Cardiovascular: Normal rate, regular rhythm. Good peripheral circulation. Grossly normal heart sounds. Respiratory: Normal respiratory effort.  No retractions.  No audible wheezing. Gastrointestinal: Soft and nontender. No distention.   Musculoskeletal: 2+ bilateral lower extremity pitting edema Neurologic:  Normal speech and language. No gross focal neurologic deficits are appreciated.  Skin:  Skin is warm, dry and intact. No rash noted. Psychiatric: Mood and affect are normal. Speech and behavior are normal.  ____________________________________________   LABS (all labs ordered are listed, but only abnormal results are displayed)  Labs Reviewed  CBC WITH DIFFERENTIAL/PLATELET - Abnormal; Notable for the following components:      Result Value   RBC 3.28 (*)    Hemoglobin 9.4 (*)    HCT 29.0 (*)    RDW 15.9 (*)    Platelets 113 (*)    nRBC 0.3 (*)    All other components within normal limits  BASIC METABOLIC PANEL - Abnormal; Notable for the following components:   BUN 56 (*)    Creatinine, Ser 10.38 (*)    Calcium 7.7 (*)    GFR calc non Af Amer 5 (*)    GFR calc Af Amer 6 (*)    All other components within normal limits  BRAIN NATRIURETIC PEPTIDE - Abnormal; Notable for the following components:   B Natriuretic Peptide 4,024.0 (*)    All other components within normal limits  SARS CORONAVIRUS 2 (HOSPITAL  ORDER, [facility])   ____________________________________________  EKG  ED ECG REPORT I, [place] N Heinz Eckert, the attending physician, personally viewed and interpreted this ECG.   Date: 11/13/2018  EKG Time: 11:16 PM  Rate: 96  Rhythm: Normal sinus rhythm  Axis: Normal  Intervals: Normal  ST&T Change: None  ____________________________________________  RADIOLOGY I, [place] N Reiss Mowrey, personally viewed and evaluated these images (plain radiographs) as part of my medical decision making, as well as reviewing the written report by the radiologist.  ED MD interpretation: Cardiomegaly with vascular congestion  Official radiology report(s): Dg Chest Port 1 View  Result Date: 11/13/2018 CLINICAL DATA:  Bilateral leg swelling and short of breath EXAM: PORTABLE CHEST 1 VIEW COMPARISON:  08/27/2018, 10/05/2016 FINDINGS: Mediastinal silhouette exaggerated by patient rotation. Cardiomegaly with vascular congestion. No large effusion. No focal consolidation or effusion. IMPRESSION: Cardiomegaly with mild central vascular congestion. Electronically Signed   By: Donavan Foil M.D.   On: 11/13/2018 23:48      Procedures   ____________________________________________   INITIAL IMPRESSION / MDM / [place] / ED COURSE  As part of my medical decision making, I reviewed the following data within the electronic MEDICAL RECORD NUMBER   63 year old male presenting with above-stated history and physical exam consistent with volume overload secondary to missed dialysis.  Patient's potassium normal.  Patient will require hemodialysis.  Patient discussed with Dr. Jannifer Franklin for hospital admission for further evaluation and management.  *NTHONY LEFFERTS was evaluated in Emergency Department on 11/14/2018 for the symptoms described in the history of present illness. He was evaluated in the context of the global COVID-19 pandemic, which necessitated consideration that the  patient might be at risk for infection with the SARS-CoV-2 virus that causes COVID-19. Institutional protocols and algorithms that pertain to the evaluation of patients at risk for COVID-19 are in a state of rapid change based on information released by regulatory bodies including the CDC and federal and state organizations. These policies and algorithms were followed during the patient's care in the ED.*    ____________________________________________  FINAL CLINICAL IMPRESSION(S) / ED DIAGNOSES  Final diagnoses:  ESRD (end stage renal disease) on  dialysis ([place])  Other hypervolemia     MEDICATIONS GIVEN DURING THIS VISIT:  Medications  morphine 2 MG/ML injection 2 mg (2 mg Intravenous Given 11/14/18 0100)     ED Discharge Orders    None       Note:  This document was prepared using Dragon voice recognition software and may include unintentional dictation errors.   Gregor Hams, MD 11/14/18 [phone]

## 2018-11-13 NOTE — ED Triage Notes (Signed)
Pt arrives POV from home for SOB, bilateral leg swelling, abd swelling and chest pain. Pt reports that he has not been to dialysis in 2 weeks because " he didn't feel good". Pt On 3L [place] for comfort at this time which he wears as needed at home. Afebrile. Denies any sick contacts or travel.

## 2018-11-13 NOTE — ED Notes (Signed)
Provider at bedside

## 2018-11-14 DIAGNOSIS — Z992 Dependence on renal dialysis: Secondary | ICD-10-CM | POA: Diagnosis not present

## 2018-11-14 DIAGNOSIS — Z9119 Patient's noncompliance with other medical treatment and regimen: Secondary | ICD-10-CM | POA: Diagnosis not present

## 2018-11-14 DIAGNOSIS — F602 Antisocial personality disorder: Secondary | ICD-10-CM | POA: Diagnosis not present

## 2018-11-14 DIAGNOSIS — N186 End stage renal disease: Secondary | ICD-10-CM

## 2018-11-14 LAB — CBC WITH DIFFERENTIAL/PLATELET
Abs Immature Granulocytes: 0.05 10*3/uL (ref 0.00–0.07)
Basophils Absolute: 0 10*3/uL (ref 0.0–0.1)
Basophils Relative: 1 %
Eosinophils Absolute: 0.1 10*3/uL (ref 0.0–0.5)
Eosinophils Relative: 1 %
HCT: 29 % — ABNORMAL LOW (ref 39.0–52.0)
Hemoglobin: 9.4 g/dL — ABNORMAL LOW (ref 13.0–17.0)
Immature Granulocytes: 1 %
Lymphocytes Relative: 20 %
Lymphs Abs: 1.2 10*3/uL (ref 0.7–4.0)
MCH: 28.7 pg (ref 26.0–34.0)
MCHC: 32.4 g/dL (ref 30.0–36.0)
MCV: 88.4 fL (ref 80.0–100.0)
Monocytes Absolute: 0.6 10*3/uL (ref 0.1–1.0)
Monocytes Relative: 10 %
Neutro Abs: 4.2 10*3/uL (ref 1.7–7.7)
Neutrophils Relative %: 67 %
Platelets: 113 10*3/uL — ABNORMAL LOW (ref 150–400)
RBC: 3.28 MIL/uL — ABNORMAL LOW (ref 4.22–5.81)
RDW: 15.9 % — ABNORMAL HIGH (ref 11.5–15.5)
WBC: 6.2 10*3/uL (ref 4.0–10.5)
nRBC: 0.3 % — ABNORMAL HIGH (ref 0.0–0.2)

## 2018-11-14 LAB — SARS CORONAVIRUS 2 BY RT PCR (HOSPITAL ORDER, PERFORMED IN ~~LOC~~ HOSPITAL LAB): SARS Coronavirus 2: NEGATIVE

## 2018-11-14 LAB — ALBUMIN: Albumin: 3.5 g/dL (ref 3.5–5.0)

## 2018-11-14 LAB — BASIC METABOLIC PANEL
Anion gap: 11 (ref 5–15)
BUN: 56 mg/dL — ABNORMAL HIGH (ref 6–20)
CO2: 24 mmol/L (ref 22–32)
Calcium: 7.7 mg/dL — ABNORMAL LOW (ref 8.9–10.3)
Chloride: 104 mmol/L (ref 98–111)
Creatinine, Ser: 10.38 mg/dL — ABNORMAL HIGH (ref 0.61–1.24)
GFR calc Af Amer: 6 mL/min — ABNORMAL LOW (ref >60)
GFR calc non Af Amer: 5 mL/min — ABNORMAL LOW (ref >60)
Glucose, Bld: 89 mg/dL (ref 70–99)
Potassium: 4.9 mmol/L (ref 3.5–5.1)
Sodium: 139 mmol/L (ref 135–145)

## 2018-11-14 LAB — MRSA PCR SCREENING: MRSA by PCR: NEGATIVE

## 2018-11-14 LAB — TSH: TSH: 0.968 u[IU]/mL (ref 0.350–4.500)

## 2018-11-14 LAB — BRAIN NATRIURETIC PEPTIDE: B Natriuretic Peptide: 4024 pg/mL — ABNORMAL HIGH (ref 0.0–100.0)

## 2018-11-14 MED ORDER — AMLODIPINE BESYLATE 10 MG PO TABS
10.0000 mg | ORAL_TABLET | Freq: Every day | ORAL | Status: DC
  Administered 2018-11-15 – 2018-11-16 (×2): 10 mg via ORAL
  Filled 2018-11-14 (×4): qty 1

## 2018-11-14 MED ORDER — NICOTINE 14 MG/24HR TD PT24
14.0000 mg | MEDICATED_PATCH | Freq: Every day | TRANSDERMAL | Status: DC
  Administered 2018-11-14 – 2018-11-16 (×3): 14 mg via TRANSDERMAL
  Filled 2018-11-14 (×3): qty 1

## 2018-11-14 MED ORDER — HEPARIN SODIUM (PORCINE) 5000 UNIT/ML IJ SOLN
5000.0000 [IU] | Freq: Three times a day (TID) | INTRAMUSCULAR | Status: DC
  Administered 2018-11-14 – 2018-11-15 (×5): 5000 [IU] via SUBCUTANEOUS
  Filled 2018-11-14 (×6): qty 1

## 2018-11-14 MED ORDER — EPOETIN ALFA 10000 UNIT/ML IJ SOLN
10000.0000 [IU] | INTRAMUSCULAR | Status: DC
  Administered 2018-11-16: 10000 [IU] via INTRAVENOUS

## 2018-11-14 MED ORDER — CHLORHEXIDINE GLUCONATE CLOTH 2 % EX PADS
6.0000 | MEDICATED_PAD | Freq: Every day | CUTANEOUS | Status: DC
  Administered 2018-11-14: 11:00:00 6 via TOPICAL

## 2018-11-14 MED ORDER — ALBUTEROL SULFATE (2.5 MG/3ML) 0.083% IN NEBU
2.5000 mg | INHALATION_SOLUTION | RESPIRATORY_TRACT | Status: DC | PRN
  Administered 2018-11-14: 20:00:00 2.5 mg via RESPIRATORY_TRACT
  Filled 2018-11-14: qty 3

## 2018-11-14 MED ORDER — ONDANSETRON HCL 4 MG PO TABS: 4.0000 mg | ORAL_TABLET | Freq: Four times a day (QID) | ORAL | Status: DC | PRN

## 2018-11-14 MED ORDER — ISOSORBIDE MONONITRATE ER 30 MG PO TB24
60.0000 mg | ORAL_TABLET | Freq: Every day | ORAL | Status: DC
  Administered 2018-11-16: 14:00:00 60 mg via ORAL
  Filled 2018-11-14 (×2): qty 2

## 2018-11-14 MED ORDER — SEVELAMER CARBONATE 800 MG PO TABS
2400.0000 mg | ORAL_TABLET | Freq: Three times a day (TID) | ORAL | Status: DC
  Administered 2018-11-14 – 2018-11-15 (×4): 2400 mg via ORAL
  Filled 2018-11-14 (×5): qty 3

## 2018-11-14 MED ORDER — ORAL CARE MOUTH RINSE
15.0000 mL | Freq: Two times a day (BID) | OROMUCOSAL | Status: DC
  Administered 2018-11-14 – 2018-11-15 (×3): 15 mL via OROMUCOSAL

## 2018-11-14 MED ORDER — ONDANSETRON HCL 4 MG/2ML IJ SOLN: 4.0000 mg | Freq: Four times a day (QID) | INTRAMUSCULAR | Status: DC | PRN

## 2018-11-14 MED ORDER — SENNOSIDES-DOCUSATE SODIUM 8.6-50 MG PO TABS
1.0000 | ORAL_TABLET | Freq: Two times a day (BID) | ORAL | Status: DC
  Administered 2018-11-14 – 2018-11-15 (×3): 1 via ORAL
  Filled 2018-11-14 (×5): qty 1

## 2018-11-14 MED ORDER — OXYCODONE HCL ER 10 MG PO T12A
20.0000 mg | EXTENDED_RELEASE_TABLET | Freq: Two times a day (BID) | ORAL | Status: DC
  Administered 2018-11-14 – 2018-11-16 (×5): 20 mg via ORAL
  Filled 2018-11-14 (×6): qty 2

## 2018-11-14 MED ORDER — ASPIRIN EC 81 MG PO TBEC
81.0000 mg | DELAYED_RELEASE_TABLET | Freq: Every day | ORAL | Status: DC
  Administered 2018-11-14 – 2018-11-16 (×3): 81 mg via ORAL
  Filled 2018-11-14 (×3): qty 1

## 2018-11-14 MED ORDER — HYDRALAZINE HCL 50 MG PO TABS
100.0000 mg | ORAL_TABLET | Freq: Three times a day (TID) | ORAL | Status: DC
  Administered 2018-11-14 – 2018-11-16 (×6): 100 mg via ORAL
  Filled 2018-11-14 (×7): qty 2

## 2018-11-14 MED ORDER — MORPHINE SULFATE (PF) 2 MG/ML IV SOLN
2.0000 mg | Freq: Once | INTRAVENOUS | Status: AC
  Administered 2018-11-14: 01:00:00 2 mg via INTRAVENOUS
  Filled 2018-11-14: qty 1

## 2018-11-14 MED ORDER — CLOPIDOGREL BISULFATE 75 MG PO TABS
75.0000 mg | ORAL_TABLET | Freq: Every day | ORAL | Status: DC
  Administered 2018-11-14 – 2018-11-16 (×3): 75 mg via ORAL
  Filled 2018-11-14 (×3): qty 1

## 2018-11-14 MED ORDER — TERAZOSIN HCL 5 MG PO CAPS
5.0000 mg | ORAL_CAPSULE | Freq: Every day | ORAL | Status: DC
  Administered 2018-11-14 – 2018-11-15 (×2): 5 mg via ORAL
  Filled 2018-11-14 (×3): qty 1

## 2018-11-14 MED ORDER — OXYCODONE HCL 5 MG PO TABS
10.0000 mg | ORAL_TABLET | Freq: Four times a day (QID) | ORAL | Status: DC | PRN
  Administered 2018-11-14 – 2018-11-16 (×8): 10 mg via ORAL
  Filled 2018-11-14 (×9): qty 2

## 2018-11-14 MED ORDER — DOCUSATE SODIUM 100 MG PO CAPS
100.0000 mg | ORAL_CAPSULE | Freq: Two times a day (BID) | ORAL | Status: DC
  Administered 2018-11-14 – 2018-11-15 (×3): 100 mg via ORAL
  Filled 2018-11-14 (×5): qty 1

## 2018-11-14 MED ORDER — TRAZODONE HCL 100 MG PO TABS: 100.0000 mg | ORAL_TABLET | Freq: Every evening | ORAL | Status: DC | PRN

## 2018-11-14 MED ORDER — SEVELAMER CARBONATE 800 MG PO TABS
1600.0000 mg | ORAL_TABLET | Freq: Three times a day (TID) | ORAL | Status: DC
  Administered 2018-11-14: 09:00:00 1600 mg via ORAL
  Filled 2018-11-14: qty 2

## 2018-11-14 NOTE — Care Management Obs Status (Signed)
[facility] NOTIFICATION   Patient Details  Name: [name] MRN: [id number] Date of Birth: 1956-05-07   Medicare Observation Status Notification Given:  Yes    Beverly Sessions, RN 11/14/2018, 10:58 AM

## 2018-11-14 NOTE — Consult Note (Addendum)
[facility] Nurse wound consult note Reason for Consult: Consult requested for right leg Wound type: healing full thickness stasis ulcer to right calf Measurement: 1X.3X.2cm Wound bed: red and moist Drainage (amount, consistency, odor) small amt tan drainage, no odor or fluctuance Periwound: intact skin surrounding Dressing procedure/placement/frequency: foam dressing to protect and promote healing Please re-consult if further assistance is needed.  Thank-you,  Julien Girt MSN, [place], [place], [place], [place]

## 2018-11-14 NOTE — TOC Initial Note (Signed)
Transition of Care [facility]) - Initial/Assessment Note    Patient Details  Name: [name] MRN: [id number] Date of Birth: 04/27/1956  Transition of Care [facility]) CM/SW Contact:    Beverly Sessions, RN Phone Number: 11/14/2018, 3:18 PM  Clinical Narrative:                 Patient admitted with fluid over load after missing HD for 2 weeks.  Patient is familiar to this RNCM.  Previous admission patient had states that he lived in [place], but was visiting his parents in [place].  At that time he requested to be set up with outpatient HD in [place]. He was set up at [place], and was a no show for his session.  As it turned out patient was not from [place], he was really receiving HD in [place].  He notified the HD liaison at that time his rationale for saying he lived in [place] was so that his home clinic did not find out about his use of drugs.  After discharge patient did not go to his outpatient HD in [place] that was set up.  He went back home to [place].   This admission patient presents from his parents home.  He states that he has be there 3 days.  Patient is very vague in his answers.  Patient states that he will be staying permanently at his parents after discharge. States that his mother will be taking him to HD.  Patient states that he does not have a PCP, and is agreeable for new patient appointment to be set up at discharge.  Patient does not have a local pharmacy that he uses.  Patient states that he does not use any DME.  It is documented that patient is on chronic O2.  When patient is asked about his O2, he initially states that he has oxygen but doesn't recall the name of the company, then states he left all of the o2 in [place], then again changes his story to that he no longer has O2.   RNCM received permission to contact patients mother.  Mother confirms that patient has been staying her for the past 3 days.  Mother states that she does not know what his plan are after  discharge.  Mother confirms that if patient wishes he can stay there long term, and she can provide transportation to HD.  Mother states that she "doesn't have clue where he was staying before, or who he was staying with".   RNCM inquired if patient was using O2 at home, and mother states "I dont know anything about that".   Elvera Bicker dialysis liaison notified of admission.   Patient will require qualifying o2 sats at discharge.  Patient would also benefit from home health RN and SW if he is agreeable.  Mother states it is ok with her for someone to come to the home.  Expected Discharge Plan: Home/Self Care Barriers to Discharge: Continued Medical Work up   Patient Goals and CMS Choice        Expected Discharge Plan and Services Expected Discharge Plan: Home/Self Care       Living arrangements for the past 2 months: Single Family Home                                      Prior Living Arrangements/Services Living arrangements for the past 2 months: Single  Family Home Lives with:: Parents   Do you feel safe going back to the place where you live?: Yes        Care giver support system in place?: Yes (comment)      Activities of Daily Living Home Assistive Devices/Equipment: Walker (specify type)(prn) ADL Screening (condition at time of admission) Patient's cognitive ability adequate to safely complete daily activities?: Yes Is the patient deaf or have difficulty hearing?: No Does the patient have difficulty seeing, even when wearing glasses/contacts?: No Does the patient have difficulty concentrating, remembering, or making decisions?: No Patient able to express need for assistance with ADLs?: Yes Does the patient have difficulty dressing or bathing?: No Independently performs ADLs?: Yes (appropriate for developmental age) Does the patient have difficulty walking or climbing stairs?: No Weakness of Legs: None Weakness of Arms/Hands: None  Permission  Sought/Granted Permission sought to share information with : Facility Art therapist granted to share information with : Yes, Verbal Permission Granted  Share Information with NAME: Parents Mr and Ms Rineer     Permission granted to share info w Relationship: Mother and father     Emotional Assessment Appearance:: Appears stated age Attitude/Demeanor/Rapport: Inconsistent Affect (typically observed): Guarded Orientation: : Oriented to Self, Oriented to Place, Oriented to Situation   Psych Involvement: Yes (comment)(consult this admission)  Admission diagnosis:  ESRD (end stage renal disease) on dialysis ([place]) [N18.6, Z99.2] Other hypervolemia [E87.79] Patient Active Problem List   Diagnosis Date Noted  . ESRD (end stage renal disease) on dialysis ([place]) 11/14/2018  . HCAP (healthcare-associated pneumonia) 08/27/2018  . Shortness of breath 08/25/2018  . Chest pain 08/20/2018  . ESRD on dialysis ([place]) 11/08/2016  . Prostate cancer screening 11/08/2016  . Acquired cyst of kidney 11/08/2016  . Urinary urgency 11/08/2016  . Acute on chronic renal failure ([place]) 09/23/2016  . Hypertensive urgency 09/21/2016  . Dysthymia 02/26/2016  . Chronic pain 02/26/2016  . Noncompliance 02/26/2016  . Opiate abuse, continuous ([place]) 02/03/2016  . Substance induced mood disorder ([place]) 02/03/2016  . Antisocial personality disorder ([place]) 02/03/2016  . Left-sided weakness 01/18/2016  . HEPATITIS C 06/28/2008  . HLD (hyperlipidemia) 06/28/2008  . SUBSTANCE ABUSE, MULTIPLE 06/28/2008  . Hypertension 06/27/2008   PCP:  Patient, No Pcp Per Pharmacy:   [facility] [place], [address] [place] [address] Phone: [phone] Fax: [phone]     Social Determinants of Health (SDOH) Interventions    Readmission Risk Interventions Readmission Risk Prevention Plan 11/14/2018  Transportation  Screening Complete  Medication Review Press photographer) [place] Not Applicable  Some recent data might be hidden

## 2018-11-14 NOTE — H&P (Signed)
[name] is an 63 y.o. male.   Chief Complaint: Shortness of breath HPI: The patient with past medical history of end-stage renal disease on dialysis, hypertension and CAD presents to the emergency department complaining of shortness of breath.  The patient states that he feels as if he has too much fluid on him.  He acknowledges that he has missed dialysis for nearly 2 weeks because he "was not feeling well".  He denies cough.  Also denies travel risk factors or known contact with individuals with exposure to novel coronavirus.  Upon arrival the patient was on 3 L of oxygen via nasal cannula which is baseline for him.  The patient's lower extremities were grossly edematous as well.  He does not know his dry weight.  Laboratory evaluation significant for elevated creatinine and BNP.  Due to symptomatic fluid overload emergency department staff call hospitalist service for admission.   Past Medical History:  Diagnosis Date  . Depression   . Heart attack ([place])   . Hypertension   . PE (pulmonary embolism)   . Renal disorder     Past Surgical History:  Procedure Laterality Date  . TOTAL HIP ARTHROPLASTY      No family history on file.  Patient denies any chronic medical illnesses within the family  Social History:  reports that he has been smoking cigarettes. He has been smoking about 0.50 packs per day. He has never used smokeless tobacco. He reports that he does not drink alcohol or use drugs.  Allergies:  Allergies  Allergen Reactions  . Cyclobenzaprine Nausea Only  . Clonidine Other (See Comments)    Asymptomatic bradycardia to 38  . Furosemide Other (See Comments)    Caused renal failure  . Tylenol [Acetaminophen] Other (See Comments)    Reaction:  Pt states it bothers his liver    Medications Prior to Admission  Medication Sig Dispense Refill  . albuterol (PROVENTIL HFA;VENTOLIN HFA) 108 (90 Base) MCG/ACT inhaler Inhale 2 puffs into the lungs every 6 (six) hours as  needed for wheezing or shortness of breath. 1 Inhaler 0  . amLODipine (NORVASC) 10 MG tablet Take 10 mg by mouth daily.  11  . aspirin EC 81 MG tablet Take 81 mg by mouth daily.    Marland Kitchen atorvastatin (LIPITOR) 40 MG tablet Take 40 mg by mouth at bedtime.   0  . benzonatate (TESSALON) 100 MG capsule Take 1 capsule (100 mg total) by mouth 3 (three) times daily as needed for cough. 20 capsule 0  . cefdinir (OMNICEF) 300 MG capsule Take 1 capsule (300 mg total) by mouth 2 (two) times daily. 8 capsule 0  . clopidogrel (PLAVIX) 75 MG tablet Take 75 mg by mouth daily.    . hydrALAZINE (APRESOLINE) 100 MG tablet Take 1 tablet (100 mg total) by mouth 3 (three) times daily. 90 tablet 2  . isosorbide mononitrate (IMDUR) 60 MG 24 hr tablet Take 1 tablet (60 mg total) by mouth daily. 30 tablet 2  . labetalol (NORMODYNE) 300 MG tablet Take 1 tablet (300 mg total) by mouth 3 (three) times daily. 90 tablet 2  . nicotine (NICODERM CQ - DOSED IN MG/24 HOURS) 14 mg/24hr patch Place 1 patch (14 mg total) onto the skin daily. 28 patch 0  . oxyCODONE (OXYCONTIN) 20 mg 12 hr tablet Take 20 mg by mouth every 12 (twelve) hours.    . Oxycodone HCl 10 MG TABS Take 10-20 mg by mouth every 4 (four) hours as needed (  for pain).     . polyethylene glycol (MIRALAX / GLYCOLAX) packet Take 17 g by mouth daily as needed for mild constipation.   0  . predniSONE (DELTASONE) 50 MG tablet Take 1 tablet (50 mg total) by mouth daily with breakfast. 3 tablet 0  . senna-docusate (SENOKOT-S) 8.6-50 MG tablet Take 1 tablet by mouth 2 (two) times daily. 30 tablet 2  . sevelamer carbonate (RENVELA) 800 MG tablet Take 2 tablets (1,600 mg total) by mouth 3 (three) times daily with meals. 180 tablet 0  . sucralfate (CARAFATE) 1 g tablet Take 1 tablet (1 g total) by mouth 4 (four) times daily. 120 tablet 1  . tamsulosin (FLOMAX) 0.4 MG CAPS capsule Take 1 capsule (0.4 mg total) by mouth daily. 30 capsule 5  . terazosin (HYTRIN) 5 MG capsule Take 1  capsule (5 mg total) by mouth at bedtime. 30 capsule 2  . traZODone (DESYREL) 100 MG tablet Take 100 mg by mouth at bedtime as needed for sleep.       Results for orders placed or performed during the hospital encounter of 11/13/18 (from the past 48 hour(s))  CBC with Differential     Status: Abnormal   Collection Time: 11/14/18 12:02 AM  Result Value Ref Range   WBC 6.2 4.0 - 10.5 K/uL   RBC 3.28 (L) 4.22 - 5.81 MIL/uL   Hemoglobin 9.4 (L) 13.0 - 17.0 g/dL   HCT 29.0 (L) 39.0 - 52.0 %   MCV 88.4 80.0 - 100.0 fL   MCH 28.7 26.0 - 34.0 pg   MCHC 32.4 30.0 - 36.0 g/dL   RDW 15.9 (H) 11.5 - 15.5 %   Platelets 113 (L) 150 - 400 K/uL    Comment: PLATELET COUNT CONFIRMED BY SMEAR Immature Platelet Fraction may be clinically indicated, consider ordering this additional test [id number]    nRBC 0.3 (H) 0.0 - 0.2 %   Neutrophils Relative % 67 %   Neutro Abs 4.2 1.7 - 7.7 K/uL   Lymphocytes Relative 20 %   Lymphs Abs 1.2 0.7 - 4.0 K/uL   Monocytes Relative 10 %   Monocytes Absolute 0.6 0.1 - 1.0 K/uL   Eosinophils Relative 1 %   Eosinophils Absolute 0.1 0.0 - 0.5 K/uL   Basophils Relative 1 %   Basophils Absolute 0.0 0.0 - 0.1 K/uL   Immature Granulocytes 1 %   Abs Immature Granulocytes 0.05 0.00 - 0.07 K/uL    Comment: Performed at [facility], [address] [place], [place] [address]  Basic metabolic panel     Status: Abnormal   Collection Time: 11/14/18 12:02 AM  Result Value Ref Range   Sodium 139 135 - 145 mmol/L   Potassium 4.9 3.5 - 5.1 mmol/L   Chloride 104 98 - 111 mmol/L   CO2 24 22 - 32 mmol/L   Glucose, Bld 89 70 - 99 mg/dL   BUN 56 (H) 6 - 20 mg/dL   Creatinine, Ser 10.38 (H) 0.61 - 1.24 mg/dL   Calcium 7.7 (L) 8.9 - 10.3 mg/dL   GFR calc non Af Amer 5 (L) >60 mL/min   GFR calc Af Amer 6 (L) >60 mL/min   Anion gap 11 5 - 15    Comment: Performed at [facility], [address] [place], [place] [address]  Brain natriuretic peptide     Status:  Abnormal   Collection Time: 11/14/18 12:02 AM  Result Value Ref Range   B Natriuretic Peptide 4,024.0 (  H) 0.0 - 100.0 pg/mL    Comment: Performed at [facility], [address] [place], [place] [address]  SARS Coronavirus 2 (CEPHEID- Performed in [facility] hospital lab), Hosp Order     Status: None   Collection Time: 11/14/18 12:02 AM  Result Value Ref Range   SARS Coronavirus 2 NEGATIVE NEGATIVE    Comment: (NOTE) If result is NEGATIVE SARS-CoV-2 target nucleic acids are NOT DETECTED. The SARS-CoV-2 RNA is generally detectable in upper and lower  respiratory specimens during the acute phase of infection. The lowest  concentration of SARS-CoV-2 viral copies this assay can detect is 250  copies / mL. A negative result does not preclude SARS-CoV-2 infection  and should not be used as the sole basis for treatment or other  patient management decisions.  A negative result may occur with  improper specimen collection / handling, submission of specimen other  than nasopharyngeal swab, presence of viral mutation(s) within the  areas targeted by this assay, and inadequate number of viral copies  (<250 copies / mL). A negative result must be combined with clinical  observations, patient history, and epidemiological information. If result is POSITIVE SARS-CoV-2 target nucleic acids are DETECTED. The SARS-CoV-2 RNA is generally detectable in upper and lower  respiratory specimens dur ing the acute phase of infection.  Positive  results are indicative of active infection with SARS-CoV-2.  Clinical  correlation with patient history and other diagnostic information is  necessary to determine patient infection status.  Positive results do  not rule out bacterial infection or co-infection with other viruses. If result is PRESUMPTIVE POSTIVE SARS-CoV-2 nucleic acids MAY BE PRESENT.   A presumptive positive result was obtained on the submitted specimen  and confirmed on repeat testing.   While 2019 novel coronavirus  (SARS-CoV-2) nucleic acids may be present in the submitted sample  additional confirmatory testing may be necessary for epidemiological  and / or clinical management purposes  to differentiate between  SARS-CoV-2 and other Sarbecovirus currently known to infect humans.  If clinically indicated additional testing with an alternate test  methodology [phone]) is advised. The SARS-CoV-2 RNA is generally  detectable in upper and lower respiratory sp ecimens during the acute  phase of infection. The expected result is Negative. Fact Sheet for Patients:  StrictlyIdeas.no Fact Sheet for Healthcare Providers: BankingDealers.co.za This test is not yet approved or cleared by the [place] FDA and has been authorized for detection and/or diagnosis of SARS-CoV-2 by FDA under an Emergency Use Authorization (EUA).  This EUA will remain in effect (meaning this test can be used) for the duration of the COVID-19 declaration under Section 564(b)(1) of the Act, 21 U.S.C. section 360bbb-3(b)(1), unless the authorization is terminated or revoked sooner. Performed at [facility], [address] [place], [place] [address]   Albumin     Status: None   Collection Time: 11/14/18 12:02 AM  Result Value Ref Range   Albumin 3.5 3.5 - 5.0 g/dL    Comment: Performed at [facility], [address] [place], [place] [address]   Dg Chest Port 1 View  Result Date: 11/13/2018 CLINICAL DATA:  Bilateral leg swelling and short of breath EXAM: PORTABLE CHEST 1 VIEW COMPARISON:  08/27/2018, 10/05/2016 FINDINGS: Mediastinal silhouette exaggerated by patient rotation. Cardiomegaly with vascular congestion. No large effusion. No focal consolidation or effusion. IMPRESSION: Cardiomegaly with mild central vascular congestion. Electronically Signed   By: Donavan Foil M.D.   On: 11/13/2018 23:48  Review of Systems   Constitutional: Negative for chills and fever.  HENT: Negative for sore throat and tinnitus.   Eyes: Negative for blurred vision and redness.  Respiratory: Negative for cough and shortness of breath.   Cardiovascular: Positive for leg swelling. Negative for chest pain, palpitations, orthopnea and PND.  Gastrointestinal: Negative for abdominal pain, diarrhea, nausea and vomiting.  Genitourinary: Negative for dysuria, frequency and urgency.  Musculoskeletal: Negative for joint pain and myalgias.  Skin: Negative for rash.       No lesions  Neurological: Positive for weakness. Negative for speech change and focal weakness.  Endo/Heme/Allergies: Does not bruise/bleed easily.       No temperature intolerance  Psychiatric/Behavioral: Negative for depression and suicidal ideas.    Blood pressure (!) 170/105, pulse 81, temperature 97.7 F (36.5 C), temperature source Oral, resp. rate 20, height 6' (1.829 m), weight 136.1 kg, SpO2 100 %. Physical Exam  Vitals reviewed. Constitutional: He is oriented to person, place, and time. He appears well-developed and well-nourished. No distress.  HENT:  Head: Normocephalic and atraumatic.  Mouth/Throat: Oropharynx is clear and moist.  Eyes: Pupils are equal, round, and reactive to light. Conjunctivae and EOM are normal. No scleral icterus.  Neck: Normal range of motion. Neck supple. No JVD present. No tracheal deviation present. No thyromegaly present.  Cardiovascular: Normal rate, regular rhythm and normal heart sounds. Exam reveals no gallop and no friction rub.  No murmur heard. Respiratory: Effort normal and breath sounds normal. No respiratory distress.  GI: Soft. Bowel sounds are normal. He exhibits no distension. There is no abdominal tenderness.  Genitourinary:    Genitourinary Comments: Deferred   Musculoskeletal: Normal range of motion.        General: Edema present.  Lymphadenopathy:    He has no cervical adenopathy.  Neurological: He is  alert and oriented to person, place, and time. No cranial nerve deficit.  Skin: Skin is warm and dry. No rash noted. No erythema.  Psychiatric: He has a normal mood and affect. His behavior is normal. Judgment and thought content normal.     Assessment/Plan This is a 63 year old male admitted for fluid overload due to renal failure. 1.  End-stage renal disease: On dialysis: Fluid overload secondary to missed dialysis for 2 weeks.  Renvela with meals.  Consult nephrology for continuation of dialysis 2.  Hypertension: Uncontrolled; continue hydralazine and amlodipine. 3.  CAD: Stable; continue aspirin, Plavix and Imdur 4.  Respiratory failure: Chronic; with hypoxia.  Continue supplemental oxygen per home regimen 5.  BPH: Continue terazosin 6.  DVT prophylaxis: Heparin 7.  GI prophylaxis: None The patient is a full code.  Time spent on admission orders and patient care approximately 45 minutes  Harrie Foreman, MD 11/14/2018, 6:41 AM

## 2018-11-14 NOTE — Progress Notes (Signed)
[facility]  ROUNDING NOTE   Subjective:   [name] admitted to [facility] on 11/13/2018 for ESRD (end stage renal disease) on dialysis [facility]) [N18.6, Z99.2] Other hypervolemia [E87.79]  Patient was discharged from [facility] on 08/30/2018 where he was supposed to start outpatient dialysis at [place]. However he never showed up. He states he was getting dialysis in [place] but not able to give me a good history. States he has not gotten dialysis in over 2 weeks.   Objective:  Vital signs in last 24 hours:  Temp:  [97.6 F (36.4 C)-98.2 F (36.8 C)] 97.6 F (36.4 C) (05/06 0820) Pulse Rate:  [74-91] 91 (05/06 0820) Resp:  [18-22] 18 (05/06 0820) BP: (149-191)/(91-118) 165/110 (05/06 0820) SpO2:  [97 %-100 %] 100 % (05/06 0820) Weight:  [136.1 kg] 136.1 kg (05/05 2330)  Weight change:  Filed Weights   11/13/18 2330  Weight: 136.1 kg    Intake/Output: I/O last 3 completed shifts: In: 120 [P.O.:120] Out: -    Intake/Output this shift:  No intake/output data recorded.  Physical Exam: General: NAD,   Head: Normocephalic, atraumatic. Moist oral mucosal membranes  Eyes: Anicteric, PERRL  Neck: Supple, trachea midline  Lungs:  Clear to auscultation  Heart: Regular rate and rhythm  Abdomen:  Soft, nontender,   Extremities:  no  peripheral edema.  Neurologic: Nonfocal, moving all four extremities  Skin: No lesions  Access: Left AVF    Basic Metabolic Panel: Recent Labs  Lab 11/14/18 0002  NA 139  K 4.9  CL 104  CO2 24  GLUCOSE 89  BUN 56*  CREATININE 10.38*  CALCIUM 7.7*    Liver Function Tests: Recent Labs  Lab 11/14/18 0002  ALBUMIN 3.5   No results for input(s): LIPASE, AMYLASE in the last 168 hours. No results for input(s): AMMONIA in the last 168 hours.  CBC: Recent Labs  Lab 11/14/18 0002  WBC 6.2  NEUTROABS 4.2  HGB 9.4*  HCT 29.0*  MCV 88.4  PLT 113*    Cardiac Enzymes: No results for input(s): CKTOTAL, CKMB,  CKMBINDEX, TROPONINI in the last 168 hours.  BNP: Invalid input(s): POCBNP  CBG: No results for input(s): GLUCAP in the last 168 hours.  Microbiology: Results for orders placed or performed during the hospital encounter of 11/13/18  SARS Coronavirus 2 (CEPHEID- Performed in [place] hospital lab), Hosp Order     Status: None   Collection Time: 11/14/18 12:02 AM  Result Value Ref Range Status   SARS Coronavirus 2 NEGATIVE NEGATIVE Final    Comment: (NOTE) If result is NEGATIVE SARS-CoV-2 target nucleic acids are NOT DETECTED. The SARS-CoV-2 RNA is generally detectable in upper and lower  respiratory specimens during the acute phase of infection. The lowest  concentration of SARS-CoV-2 viral copies this assay can detect is 250  copies / mL. A negative result does not preclude SARS-CoV-2 infection  and should not be used as the sole basis for treatment or other  patient management decisions.  A negative result may occur with  improper specimen collection / handling, submission of specimen other  than nasopharyngeal swab, presence of viral mutation(s) within the  areas targeted by this assay, and inadequate number of viral copies  (<250 copies / mL). A negative result must be combined with clinical  observations, patient history, and epidemiological information. If result is POSITIVE SARS-CoV-2 target nucleic acids are DETECTED. The SARS-CoV-2 RNA is generally detectable in upper and lower  respiratory specimens  dur ing the acute phase of infection.  Positive  results are indicative of active infection with SARS-CoV-2.  Clinical  correlation with patient history and other diagnostic information is  necessary to determine patient infection status.  Positive results do  not rule out bacterial infection or co-infection with other viruses. If result is PRESUMPTIVE POSTIVE SARS-CoV-2 nucleic acids MAY BE PRESENT.   A presumptive positive result was obtained on the submitted specimen   and confirmed on repeat testing.  While 2019 novel coronavirus  (SARS-CoV-2) nucleic acids may be present in the submitted sample  additional confirmatory testing may be necessary for epidemiological  and / or clinical management purposes  to differentiate between  SARS-CoV-2 and other Sarbecovirus currently known to infect humans.  If clinically indicated additional testing with an alternate test  methodology [phone]) is advised. The SARS-CoV-2 RNA is generally  detectable in upper and lower respiratory sp ecimens during the acute  phase of infection. The expected result is Negative. Fact Sheet for Patients:  StrictlyIdeas.no Fact Sheet for Healthcare Providers: BankingDealers.co.za This test is not yet approved or cleared by the [place] FDA and has been authorized for detection and/or diagnosis of SARS-CoV-2 by FDA under an Emergency Use Authorization (EUA).  This EUA will remain in effect (meaning this test can be used) for the duration of the COVID-19 declaration under Section 564(b)(1) of the Act, 21 U.S.C. section 360bbb-3(b)(1), unless the authorization is terminated or revoked sooner. Performed at [facility], [address] [place], [place] [address]   MRSA PCR Screening     Status: None   Collection Time: 11/14/18  4:48 AM  Result Value Ref Range Status   MRSA by PCR NEGATIVE NEGATIVE Final    Comment:        The GeneXpert MRSA Assay (FDA approved for NASAL specimens only), is one component of a comprehensive MRSA colonization surveillance program. It is not intended to diagnose MRSA infection nor to guide or monitor treatment for MRSA infections. Performed at [facility], [address] [place], [place] [address]     Coagulation Studies: No results for input(s): LABPROT, INR in the last 72 hours.  Urinalysis: No results for input(s): COLORURINE, LABSPEC, PHURINE, GLUCOSEU, HGBUR,  BILIRUBINUR, KETONESUR, PROTEINUR, UROBILINOGEN, NITRITE, LEUKOCYTESUR in the last 72 hours.  Invalid input(s): APPERANCEUR    Imaging: Dg Chest Port 1 View  Result Date: 11/13/2018 CLINICAL DATA:  Bilateral leg swelling and short of breath EXAM: PORTABLE CHEST 1 VIEW COMPARISON:  08/27/2018, 10/05/2016 FINDINGS: Mediastinal silhouette exaggerated by patient rotation. Cardiomegaly with vascular congestion. No large effusion. No focal consolidation or effusion. IMPRESSION: Cardiomegaly with mild central vascular congestion. Electronically Signed   By: Donavan Foil M.D.   On: 11/13/2018 23:48     Medications:    . amLODipine  10 mg Oral Daily  . aspirin EC  81 mg Oral Daily  . Chlorhexidine Gluconate Cloth  6 each Topical Q0600  . clopidogrel  75 mg Oral Daily  . docusate sodium  100 mg Oral BID  . epoetin (EPOGEN/PROCRIT) injection  10,000 Units Intravenous Q M,W,F-HD  . heparin  5,000 Units Subcutaneous Q8H  . hydrALAZINE  100 mg Oral TID  . isosorbide mononitrate  60 mg Oral Daily  . mouth rinse  15 mL Mouth Rinse BID  . nicotine  14 mg Transdermal Daily  . senna-docusate  1 tablet Oral BID  . sevelamer carbonate  1,600 mg Oral TID WC  . terazosin  5 mg  Oral QHS   albuterol, ondansetron **OR** ondansetron (ZOFRAN) IV, oxyCODONE, traZODone  Assessment/ Plan:  [name] is a 63 y.o. black male with end stage renal disease on hemodialysis, depression, hypertension, pulmonary embolism, substance abuse.   1. End stage renal disease: reports missing 2 weeks of hemodialysis. However patient does not seem to have uremic symptoms.  - Dialysis for today. Orders prepared.   2.  Anemia chronic kidney disease.  With thrombocytopenia   - EPO with HD treatment  3.  Secondary hyperparathyroidism   - Sevelamer with meals.   4. Hypertension: severely elevated. 165/110  Home regimen of tamsulosin and labetalol.    LOS: 0 Seletha Zimmermann 5/6/202011:22 AM

## 2018-11-14 NOTE — Progress Notes (Signed)
Hemodialysis treatment completed at 1515. Net UF removed 1L Blood rinsed back, Needles removed one at a time and manual pressure held to AVF until hemostasis achieved. No active bleeding upon patient departure from ICU 18. Post hemodialysis report given to M. Sinwani, RN.    11/14/18 1515  Hand-Off documentation  Report given to (Full Name) Lolita Patella, RN  Vital Signs  Temp 98.1 F (36.7 C)  Pulse Rate 81  Resp 16  BP (!) 161/97  BP Location Right Arm  BP Method Automatic  Patient Position (if appropriate) Lying  Oxygen Therapy  SpO2 100 %  During Hemodialysis Assessment  Blood Flow Rate (mL/min) 350 mL/min  Arterial Pressure (mmHg) -160 mmHg  Venous Pressure (mmHg) 160 mmHg  Transmembrane Pressure (mmHg) 70 mmHg  Ultrafiltration Rate (mL/min) 670 mL/min  Dialysate Flow Rate (mL/min) 600 ml/min  Conductivity: Machine  13.8  HD Safety Checks Performed Yes  Intra-Hemodialysis Comments Tx completed  Post-Hemodialysis Assessment  Rinseback Volume (mL) 250 mL  KECN 49.5 V  Dialyzer Clearance Lightly streaked  Duration of HD Treatment -hour(s) 3.5 hour(s)  Hemodialysis Intake (mL) 500 mL  UF Total -Machine (mL) 1500 mL  Net UF (mL) 1000 mL  Tolerated HD Treatment Yes  AVG/AVF Arterial Site Held (minutes) 5 minutes  AVG/AVF Venous Site Held (minutes) 5 minutes  Education / Care Plan  Dialysis Education Provided Yes  Documented Education in Care Plan Yes  Outpatient Plan of Care Reviewed and on Chart Yes  Fistula / Graft Left Upper arm Arteriovenous fistula  No Placement Date or Time found.   Placed prior to admission: Yes  Orientation: Left  Access Location: Upper arm  Access Type: Arteriovenous fistula  Site Condition No complications  Fistula / Graft Assessment Present;Thrill;Bruit  Status Deaccessed  Needle Size 15  Drainage Description None

## 2018-11-14 NOTE — Progress Notes (Signed)
Pre hemodialysis report received from M. Sinwani, Therapist, sports. Received patient in ICU room 18.  Awake, alert and verbally responsive. No actute distress noted. + Bruit/thrill noted to LUE AVF. AVF accessed with two 15g needles without difficulty. Hemodialysis treatment initiated at 1150 via AVF.  Plan for 3 hours hemodialysis with UF of 1.5 L as tolerated.    11/14/18 1150  Hand-Off documentation  Report received from (Full Name) Lolita Patella, RN  Vital Signs  Temp 98.6 F (37 C)  Temp Source Oral  Pulse Rate 97  Resp 14  BP (!) 147/89  BP Location Right Arm  BP Method Automatic  Patient Position (if appropriate) Lying  Oxygen Therapy  SpO2 100 %  O2 Device Nasal Cannula  O2 Flow Rate (L/min) 2 L/min  Pain Assessment  Pain Scale 0-10  Pain Score 0  Time-Out for Hemodialysis  What Procedure? dialysis  Pt Identifiers(min of two) First/Last Name;MRN/Account#  Correct Site? Yes  Correct Side? Yes  Correct Procedure? Yes  Consents Verified? Yes  Rad Studies Available? N/A  Safety Precautions Reviewed? Yes  Engineer, civil (consulting) Number 7  Station Number  (bedside )  UF/Alarm Test Passed  Conductivity: Meter 14  Conductivity: Machine  14  pH 7.4  Reverse Osmosis 4  Normal Saline Lot Number [id number]  Dialyzer Lot Number [id number]  Disposable Set Lot Number [id number]  Machine Temperature 98.6 F (37 C)  Musician and Audible Yes  Blood Lines Intact and Secured Yes  Pre Treatment Patient Checks  Vascular access used during treatment Fistula  Hepatitis B Surface Antigen Results Negative  Hepatitis B Surface Antibody  (core immunity present)  Date Hepatitis B Surface Antibody Drawn 07/24/17  Hemodialysis Consent Verified Yes  Hemodialysis Standing Orders Initiated Yes  ECG (Telemetry) Monitor On Yes  Prime Ordered Normal Saline  Length of  DialysisTreatment -hour(s) 3 Hour(s)  Dialyzer Elisio 17H NR  Dialysate 2K, 2.5 Ca  Dialysate Flow Ordered 600  Blood Flow Rate  Ordered 350 mL/min  Ultrafiltration Goal 1500 Liters  During Hemodialysis Assessment  Blood Flow Rate (mL/min) 350 mL/min  Arterial Pressure (mmHg) -120 mmHg  Venous Pressure (mmHg) 130 mmHg  Transmembrane Pressure (mmHg) 70 mmHg  Ultrafiltration Rate (mL/min) 670 mL/min  Dialysate Flow Rate (mL/min) 600 ml/min  Conductivity: Machine  143  HD Safety Checks Performed Yes  Dialysis Fluid Bolus Normal Saline  Bolus Amount (mL) 250 mL (prime)  Intra-Hemodialysis Comments Tx initiated

## 2018-11-14 NOTE — Progress Notes (Signed)
Pre hemodialysis assessment   11/14/18 1130  Neurological  Level of Consciousness Alert  Orientation Level Oriented X4  Respiratory  Respiratory Pattern Regular  Chest Assessment Chest expansion symmetrical  Bilateral Breath Sounds Diminished  Cardiac  Pulse Regular  Jugular Venous Distention (JVD) No  Cardiac Rhythm NSR  Antiarrhythmic device No  Vascular  R Radial Pulse +2  L Radial Pulse +2  Edema Right lower extremity;Left lower extremity  RLE Edema +3  LLE Edema +3  Integumentary  Integumentary (WDL) X  Skin Color Appropriate for ethnicity  Skin Condition Dry  Musculoskeletal  Musculoskeletal (WDL) X  Generalized Weakness Yes  Gastrointestinal  Bowel Sounds Assessment Active  Psychosocial  Psychosocial (WDL) WDL

## 2018-11-14 NOTE — ED Notes (Signed)
ED TO INPATIENT HANDOFF REPORT  ED Nurse Name and Phone #: Anson Crofts Name/Age/Gender [name] 63 y.o. male Room/Bed: ED32A/ED32A  Code Status   Code Status: Prior  Home/SNF/Other Home Patient oriented to: self, place, time and situation Is this baseline? Yes   Triage Complete: Triage complete  Chief Complaint shortness of breath; chest pain   Triage Note Pt arrives POV from home for SOB, bilateral leg swelling, abd swelling and chest pain. Pt reports that he has not been to dialysis in 2 weeks because " he didn't feel good". Pt On 3L [place] for comfort at this time which he wears as needed at home. Afebrile. Denies any sick contacts or travel.    Allergies Allergies  Allergen Reactions  . Cyclobenzaprine Nausea Only  . Clonidine Other (See Comments)    Asymptomatic bradycardia to 38  . Furosemide Other (See Comments)    Caused renal failure  . Tylenol [Acetaminophen] Other (See Comments)    Reaction:  Pt states it bothers his liver    Level of Care/Admitting Diagnosis ED Disposition    ED Disposition Condition [place]: [place] [[id number]]  Level of Care: Med-Surg [16]  Covid Evaluation: Screening Protocol (No Symptoms)  Diagnosis: ESRD (end stage renal disease) on dialysis [facility]) [371696]  Admitting Physician: Harrie Foreman [[id number]]  Attending Physician: Harrie Foreman [phone]  PT Class (Do Not Modify): Observation [104]  PT Acc Code (Do Not Modify): Observation [10022]       B Medical/Surgery History Past Medical History:  Diagnosis Date  . Depression   . Heart attack ([place])   . Hypertension   . PE (pulmonary embolism)   . Renal disorder    Past Surgical History:  Procedure Laterality Date  . TOTAL HIP ARTHROPLASTY       A IV Location/Drains/Wounds Patient Lines/Drains/Airways Status   Active Line/Drains/Airways    Name:   Placement date:   Placement time:   Site:   Days:   Peripheral IV  11/13/18 Right Forearm   11/13/18    2336    Forearm   1   Fistula / Graft Left Upper arm Arteriovenous fistula   -    -    Upper arm             Intake/Output Last 24 hours No intake or output data in the 24 hours ending 11/14/18 0327  Labs/Imaging Results for orders placed or performed during the hospital encounter of 11/13/18 (from the past 48 hour(s))  CBC with Differential     Status: Abnormal   Collection Time: 11/14/18 12:02 AM  Result Value Ref Range   WBC 6.2 4.0 - 10.5 K/uL   RBC 3.28 (L) 4.22 - 5.81 MIL/uL   Hemoglobin 9.4 (L) 13.0 - 17.0 g/dL   HCT 29.0 (L) 39.0 - 52.0 %   MCV 88.4 80.0 - 100.0 fL   MCH 28.7 26.0 - 34.0 pg   MCHC 32.4 30.0 - 36.0 g/dL   RDW 15.9 (H) 11.5 - 15.5 %   Platelets 113 (L) 150 - 400 K/uL    Comment: PLATELET COUNT CONFIRMED BY SMEAR Immature Platelet Fraction may be clinically indicated, consider ordering this additional test [id number]    nRBC 0.3 (H) 0.0 - 0.2 %   Neutrophils Relative % 67 %   Neutro Abs 4.2 1.7 - 7.7 K/uL   Lymphocytes Relative 20 %   Lymphs Abs 1.2 0.7 - 4.0 K/uL  Monocytes Relative 10 %   Monocytes Absolute 0.6 0.1 - 1.0 K/uL   Eosinophils Relative 1 %   Eosinophils Absolute 0.1 0.0 - 0.5 K/uL   Basophils Relative 1 %   Basophils Absolute 0.0 0.0 - 0.1 K/uL   Immature Granulocytes 1 %   Abs Immature Granulocytes 0.05 0.00 - 0.07 K/uL    Comment: Performed at [facility], [address] [place], [place] [address]  Basic metabolic panel     Status: Abnormal   Collection Time: 11/14/18 12:02 AM  Result Value Ref Range   Sodium 139 135 - 145 mmol/L   Potassium 4.9 3.5 - 5.1 mmol/L   Chloride 104 98 - 111 mmol/L   CO2 24 22 - 32 mmol/L   Glucose, Bld 89 70 - 99 mg/dL   BUN 56 (H) 6 - 20 mg/dL   Creatinine, Ser 10.38 (H) 0.61 - 1.24 mg/dL   Calcium 7.7 (L) 8.9 - 10.3 mg/dL   GFR calc non Af Amer 5 (L) >60 mL/min   GFR calc Af Amer 6 (L) >60 mL/min   Anion gap 11 5 - 15    Comment: Performed at  [facility], [address] [place], [place] [address]  Brain natriuretic peptide     Status: Abnormal   Collection Time: 11/14/18 12:02 AM  Result Value Ref Range   B Natriuretic Peptide 4,024.0 (H) 0.0 - 100.0 pg/mL    Comment: Performed at [facility], [address] [place], [place] [address]  SARS Coronavirus 2 (CEPHEID- Performed in [place] hospital lab), Hosp Order     Status: None   Collection Time: 11/14/18 12:02 AM  Result Value Ref Range   SARS Coronavirus 2 NEGATIVE NEGATIVE    Comment: (NOTE) If result is NEGATIVE SARS-CoV-2 target nucleic acids are NOT DETECTED. The SARS-CoV-2 RNA is generally detectable in upper and lower  respiratory specimens during the acute phase of infection. The lowest  concentration of SARS-CoV-2 viral copies this assay can detect is 250  copies / mL. A negative result does not preclude SARS-CoV-2 infection  and should not be used as the sole basis for treatment or other  patient management decisions.  A negative result may occur with  improper specimen collection / handling, submission of specimen other  than nasopharyngeal swab, presence of viral mutation(s) within the  areas targeted by this assay, and inadequate number of viral copies  (<250 copies / mL). A negative result must be combined with clinical  observations, patient history, and epidemiological information. If result is POSITIVE SARS-CoV-2 target nucleic acids are DETECTED. The SARS-CoV-2 RNA is generally detectable in upper and lower  respiratory specimens dur ing the acute phase of infection.  Positive  results are indicative of active infection with SARS-CoV-2.  Clinical  correlation with patient history and other diagnostic information is  necessary to determine patient infection status.  Positive results do  not rule out bacterial infection or co-infection with other viruses. If result is PRESUMPTIVE POSTIVE SARS-CoV-2 nucleic acids MAY BE PRESENT.    A presumptive positive result was obtained on the submitted specimen  and confirmed on repeat testing.  While 2019 novel coronavirus  (SARS-CoV-2) nucleic acids may be present in the submitted sample  additional confirmatory testing may be necessary for epidemiological  and / or clinical management purposes  to differentiate between  SARS-CoV-2 and other Sarbecovirus currently known to infect humans.  If clinically indicated additional testing with an alternate test  methodology (  [id number]) is advised. The SARS-CoV-2 RNA is generally  detectable in upper and lower respiratory sp ecimens during the acute  phase of infection. The expected result is Negative. Fact Sheet for Patients:  StrictlyIdeas.no Fact Sheet for Healthcare Providers: BankingDealers.co.za This test is not yet approved or cleared by the [place] FDA and has been authorized for detection and/or diagnosis of SARS-CoV-2 by FDA under an Emergency Use Authorization (EUA).  This EUA will remain in effect (meaning this test can be used) for the duration of the COVID-19 declaration under Section 564(b)(1) of the Act, 21 U.S.C. section 360bbb-3(b)(1), unless the authorization is terminated or revoked sooner. Performed at [facility], [address] [place], [place] [address]    Dg Chest Port 1 View  Result Date: 11/13/2018 CLINICAL DATA:  Bilateral leg swelling and short of breath EXAM: PORTABLE CHEST 1 VIEW COMPARISON:  08/27/2018, 10/05/2016 FINDINGS: Mediastinal silhouette exaggerated by patient rotation. Cardiomegaly with vascular congestion. No large effusion. No focal consolidation or effusion. IMPRESSION: Cardiomegaly with mild central vascular congestion. Electronically Signed   By: Donavan Foil M.D.   On: 11/13/2018 23:48    Pending Labs [employer] (From admission, onward)    Start     Ordered   Signed and Held  TSH  Add-on,   R     Signed and Held           Vitals/Pain Today's Vitals   11/14/18 0054 11/14/18 0143 11/14/18 0233 11/14/18 0306  BP: (!) 161/100 (!) 191/98 (!) 167/108 (!) 149/91  Pulse:  91 77 74  Resp: (!) 22 (!) 22 (!) 22 20  Temp:  97.9 F (36.6 C)    TempSrc:  Oral    SpO2: 100% 97% 100% 99%  Weight:      Height:      PainSc:  0-No pain      Isolation Precautions Droplet and Contact precautions  Medications Medications  morphine 2 MG/ML injection 2 mg (2 mg Intravenous Given 11/14/18 0100)    Mobility walks Moderate fall risk   Focused Assessments Renal Assessment Handoff:  Hemodialysis Schedule: Hemodialysis Schedule: Tuesday/Thursday/Saturday Last Hemodialysis date and time: hasnt been in 2 weeks   Restricted appendage: left arm     R Recommendations: See Admitting Provider Note  Report given to:   Additional Notes:

## 2018-11-14 NOTE — Progress Notes (Signed)
This note also relates to the following rows which could not be included: Pulse Rate - Cannot attach notes to unvalidated device data Resp - Cannot attach notes to unvalidated device data BP - Cannot attach notes to unvalidated device data SpO2 - Cannot attach notes to unvalidated device data   

## 2018-11-14 NOTE — Consult Note (Addendum)
[facility] Consult   Reason for Consult:  Capacity Referring Physician:  Dr. Anselm Jungling Patient Identification: [name] MRN:  [id number] Principal Diagnosis: ESRD (end stage renal disease) on dialysis [facility]) Diagnosis:   Patient Active Problem List   Diagnosis Date Noted  . ESRD (end stage renal disease) on dialysis ([place]) [N18.6, Z99.2] 11/14/2018  . HCAP (healthcare-associated pneumonia) [J18.9] 08/27/2018  . Shortness of breath [R06.02] 08/25/2018  . Chest pain [R07.9] 08/20/2018  . ESRD on dialysis ([place]) [N18.6, Z99.2] 11/08/2016  . Prostate cancer screening [Z12.5] 11/08/2016  . Acquired cyst of kidney [N28.1] 11/08/2016  . Urinary urgency [R39.15] 11/08/2016  . Acute on chronic renal failure ([place]) [N17.9, N18.9] 09/23/2016  . Hypertensive urgency [I16.0] 09/21/2016  . Dysthymia [F34.1] 02/26/2016  . Chronic pain [G89.29] 02/26/2016  . Noncompliance [Z91.19] 02/26/2016  . Opiate abuse, continuous ([place]) [F11.10] 02/03/2016  . Substance induced mood disorder ([place]) [F19.94] 02/03/2016  . Antisocial personality disorder ([place]) [F60.2] 02/03/2016  . Left-sided weakness [R53.1] 01/18/2016  . HEPATITIS C [B17.10] 06/28/2008  . HLD (hyperlipidemia) [E78.5] 06/28/2008  . SUBSTANCE ABUSE, MULTIPLE [F19.10] 06/28/2008  . Hypertension [I10] 06/27/2008   Patient interviewed. Chart reviewed. Labs and vitals reviewed. Total Time spent with patient: 45 minutes  Subjective: "I want to get dialysis"  HPI:  [name] is a 63 y.o. male patient admitted with Chief Complaint: Shortness of breath The patient with past medical history of end-stage renal disease on dialysis, hypertension and CAD presents to the emergency department complaining of shortness of breath.  The patient states that he feels as if he has too much fluid on him.  He acknowledges that he has missed dialysis for nearly 2 weeks because he "was not feeling well".  He denies cough.  Also denies  travel risk factors or known contact with individuals with exposure to novel coronavirus.  Upon arrival the patient was on 3 L of oxygen via nasal cannula which is baseline for him.  The patient's lower extremities were grossly edematous as well.  He does not know his dry weight.  Laboratory evaluation significant for elevated creatinine and BNP.  Due to symptomatic fluid overload emergency department staff call hospitalist service for admission.   Psychiatry consultation is requested to determine patient capacity for treatment.  On evaluation, patient is completing dialysis treatment.  He is calm and cooperative.  Patient states that he wishes to continue receiving dialysis treatment.  He is able to state understanding that if he is to discontinue dialysis treatment he will die of end-stage renal disease.  Patient specifically states that he does not wish to die of end-stage renal disease.  He denies any suicidal ideation, plan or intent.  He denies any homicidal ideation.  He denies any auditory or visual hallucinations.  Patient denies any past history of self-harm or suicide attempts.  He denies having access to a weapon.  Patient reports that he had moved to [place], hence missed 2 weeks of treatment.  He states that he intends to continue living in [place] now with his parents.  Patient reports good appetite and adequate sleep.  He denies symptoms of depression.  He denies past symptoms of mania or psychosis.  Patient is alert and oriented, however is uncertain of date.  Per chart review with updates. Social history: Patient reports that he lives with his parents. This is been what he says every time we've seen him come through here. He is on disability  Medical history: Had a  hip replacement.  Chronic hip and back pain.   Substance abuse history: Patient is frequently overly concerned about his narcotics and we've had frequent evidence that he takes more than what he is prescribed or  inappropriate doses of narcotics. He frequently doctor shops for narcotics.   Past Psychiatric History: Patient has had many presentations to the emergency room. He's had a couple of admissions to psychiatry. He has never actually done anything to try to kill himself. No history known of any violence. He has been referred over and over to local mental health is no evidence that he never follows up with it.  Risk to Self:  Denies Risk to Others:  Denies Prior Inpatient Therapy:  Yes Prior Outpatient Therapy:  Noncompliant, patient does not desire follow-up with outpatient psychiatry  Past Medical History:  Past Medical History:  Diagnosis Date  . Depression   . Heart attack ([place])   . Hypertension   . PE (pulmonary embolism)   . Renal disorder     Past Surgical History:  Procedure Laterality Date  . TOTAL HIP ARTHROPLASTY     Family History: No family history on file. Family Psychiatric  History: Patient denies knowing of any family history of mental illness  Social History:  Social History   Substance and Sexual Activity  Alcohol Use No     Social History   Substance and Sexual Activity  Drug Use No   Comment: pt has been positive for cocaine twice this week but denies use    Social History   Socioeconomic History  . Marital status: Divorced    Spouse name: Not on file  . Number of children: Not on file  . Years of education: Not on file  . Highest education level: Not on file  Occupational History  . Not on file  Social Needs  . Financial resource strain: Not on file  . Food insecurity:    Worry: Not on file    Inability: Not on file  . Transportation needs:    Medical: Not on file    Non-medical: Not on file  Tobacco Use  . Smoking status: Current Every Day Smoker    Packs/day: 0.50    Types: Cigarettes  . Smokeless tobacco: Never Used  Substance and Sexual Activity  . Alcohol use: No  . Drug use: No    Comment: pt has been positive for cocaine twice  this week but denies use  . Sexual activity: Not on file  Lifestyle  . Physical activity:    Days per week: Not on file    Minutes per session: Not on file  . Stress: Not on file  Relationships  . Social connections:    Talks on phone: Not on file    Gets together: Not on file    Attends religious service: Not on file    Active member of club or organization: Not on file    Attends meetings of clubs or organizations: Not on file    Relationship status: Not on file  Other Topics Concern  . Not on file  Social History Narrative  . Not on file   Additional Social History:    Allergies:   Allergies  Allergen Reactions  . Cyclobenzaprine Nausea Only  . Clonidine Other (See Comments)    Asymptomatic bradycardia to 38  . Furosemide Other (See Comments)    Caused renal failure  . Tylenol [Acetaminophen] Other (See Comments)    Reaction:  Pt states it bothers his  liver    Labs:  Results for orders placed or performed during the hospital encounter of 11/13/18 (from the past 48 hour(s))  CBC with Differential     Status: Abnormal   Collection Time: 11/14/18 12:02 AM  Result Value Ref Range   WBC 6.2 4.0 - 10.5 K/uL   RBC 3.28 (L) 4.22 - 5.81 MIL/uL   Hemoglobin 9.4 (L) 13.0 - 17.0 g/dL   HCT 29.0 (L) 39.0 - 52.0 %   MCV 88.4 80.0 - 100.0 fL   MCH 28.7 26.0 - 34.0 pg   MCHC 32.4 30.0 - 36.0 g/dL   RDW 15.9 (H) 11.5 - 15.5 %   Platelets 113 (L) 150 - 400 K/uL    Comment: PLATELET COUNT CONFIRMED BY SMEAR Immature Platelet Fraction may be clinically indicated, consider ordering this additional test [id number]    nRBC 0.3 (H) 0.0 - 0.2 %   Neutrophils Relative % 67 %   Neutro Abs 4.2 1.7 - 7.7 K/uL   Lymphocytes Relative 20 %   Lymphs Abs 1.2 0.7 - 4.0 K/uL   Monocytes Relative 10 %   Monocytes Absolute 0.6 0.1 - 1.0 K/uL   Eosinophils Relative 1 %   Eosinophils Absolute 0.1 0.0 - 0.5 K/uL   Basophils Relative 1 %   Basophils Absolute 0.0 0.0 - 0.1 K/uL   Immature  Granulocytes 1 %   Abs Immature Granulocytes 0.05 0.00 - 0.07 K/uL    Comment: Performed at [facility], [address] [place], [place] [address]  Basic metabolic panel     Status: Abnormal   Collection Time: 11/14/18 12:02 AM  Result Value Ref Range   Sodium 139 135 - 145 mmol/L   Potassium 4.9 3.5 - 5.1 mmol/L   Chloride 104 98 - 111 mmol/L   CO2 24 22 - 32 mmol/L   Glucose, Bld 89 70 - 99 mg/dL   BUN 56 (H) 6 - 20 mg/dL   Creatinine, Ser 10.38 (H) 0.61 - 1.24 mg/dL   Calcium 7.7 (L) 8.9 - 10.3 mg/dL   GFR calc non Af Amer 5 (L) >60 mL/min   GFR calc Af Amer 6 (L) >60 mL/min   Anion gap 11 5 - 15    Comment: Performed at [facility], [address] [place], [place] [address]  Brain natriuretic peptide     Status: Abnormal   Collection Time: 11/14/18 12:02 AM  Result Value Ref Range   B Natriuretic Peptide 4,024.0 (H) 0.0 - 100.0 pg/mL    Comment: Performed at [facility], [address], [place], [place] [address]  SARS Coronavirus 2 (CEPHEID- Performed in [facility] hospital lab), Hosp Order     Status: None   Collection Time: 11/14/18 12:02 AM  Result Value Ref Range   SARS Coronavirus 2 NEGATIVE NEGATIVE    Comment: (NOTE) If result is NEGATIVE SARS-CoV-2 target nucleic acids are NOT DETECTED. The SARS-CoV-2 RNA is generally detectable in upper and lower  respiratory specimens during the acute phase of infection. The lowest  concentration of SARS-CoV-2 viral copies this assay can detect is 250  copies / mL. A negative result does not preclude SARS-CoV-2 infection  and should not be used as the sole basis for treatment or other  patient management decisions.  A negative result may occur with  improper specimen collection / handling, submission of specimen other  than nasopharyngeal swab, presence of viral mutation(s) within the  areas targeted by this assay, and inadequate  number of viral copies  (<250 copies / mL). A negative result  must be combined with clinical  observations, patient history, and epidemiological information. If result is POSITIVE SARS-CoV-2 target nucleic acids are DETECTED. The SARS-CoV-2 RNA is generally detectable in upper and lower  respiratory specimens dur ing the acute phase of infection.  Positive  results are indicative of active infection with SARS-CoV-2.  Clinical  correlation with patient history and other diagnostic information is  necessary to determine patient infection status.  Positive results do  not rule out bacterial infection or co-infection with other viruses. If result is PRESUMPTIVE POSTIVE SARS-CoV-2 nucleic acids MAY BE PRESENT.   A presumptive positive result was obtained on the submitted specimen  and confirmed on repeat testing.  While 2019 novel coronavirus  (SARS-CoV-2) nucleic acids may be present in the submitted sample  additional confirmatory testing may be necessary for epidemiological  and / or clinical management purposes  to differentiate between  SARS-CoV-2 and other Sarbecovirus currently known to infect humans.  If clinically indicated additional testing with an alternate test  methodology [phone]) is advised. The SARS-CoV-2 RNA is generally  detectable in upper and lower respiratory sp ecimens during the acute  phase of infection. The expected result is Negative. Fact Sheet for Patients:  StrictlyIdeas.no Fact Sheet for Healthcare Providers: BankingDealers.co.za This test is not yet approved or cleared by the [place] FDA and has been authorized for detection and/or diagnosis of SARS-CoV-2 by FDA under an Emergency Use Authorization (EUA).  This EUA will remain in effect (meaning this test can be used) for the duration of the COVID-19 declaration under Section 564(b)(1) of the Act, 21 U.S.C. section 360bbb-3(b)(1), unless the authorization is terminated or revoked sooner. Performed at [facility], [address] [place], [place] [address]   Albumin     Status: None   Collection Time: 11/14/18 12:02 AM  Result Value Ref Range   Albumin 3.5 3.5 - 5.0 g/dL    Comment: Performed at [facility], [address] [place], [place] [address]  TSH     Status: None   Collection Time: 11/14/18 12:02 AM  Result Value Ref Range   TSH 0.968 0.350 - 4.500 uIU/mL    Comment: Performed by a 3rd Generation assay with a functional sensitivity of <=0.01 uIU/mL. Performed at [facility], [address] [place], [place] [address]   MRSA PCR Screening     Status: None   Collection Time: 11/14/18  4:48 AM  Result Value Ref Range   MRSA by PCR NEGATIVE NEGATIVE    Comment:        The GeneXpert MRSA Assay (FDA approved for NASAL specimens only), is one component of a comprehensive MRSA colonization surveillance program. It is not intended to diagnose MRSA infection nor to guide or monitor treatment for MRSA infections. Performed at [facility], [address] [place], [place] [address]     Current Facility-Administered Medications  Medication Dose Route Frequency Provider Last Rate Last Dose  . albuterol (PROVENTIL) (2.5 MG/3ML) 0.083% nebulizer solution 2.5 mg  2.5 mg Nebulization Q4H PRN Harrie Foreman, MD      . amLODipine (NORVASC) tablet 10 mg  10 mg Oral Daily Harrie Foreman, MD   Stopped at 11/14/18 1018  . aspirin EC tablet 81 mg  81 mg Oral Daily Harrie Foreman, MD   81 mg at 11/14/18 1026  . Chlorhexidine Gluconate Cloth 2 % PADS 6 each  6 each Topical Q0600 Lavonia Dana, MD   6 each at 11/14/18 1033  . clopidogrel (PLAVIX) tablet 75 mg  75 mg Oral Daily Harrie Foreman, MD   75 mg at 11/14/18 1026  . docusate sodium (COLACE) capsule 100 mg  100 mg Oral BID Harrie Foreman, MD      . epoetin alfa (EPOGEN) injection 10,000 Units  10,000 Units Intravenous Q M,W,F-HD Kolluru, Sarath, MD      . heparin injection 5,000  Units  5,000 Units Subcutaneous Q8H Harrie Foreman, MD      . hydrALAZINE (APRESOLINE) tablet 100 mg  100 mg Oral TID Harrie Foreman, MD      . isosorbide mononitrate (IMDUR) 24 hr tablet 60 mg  60 mg Oral Daily Harrie Foreman, MD   Stopped at 11/14/18 1020  . MEDLINE mouth rinse  15 mL Mouth Rinse BID Vaughan Basta, MD   15 mL at 11/14/18 1027  . nicotine (NICODERM CQ - dosed in mg/24 hours) patch 14 mg  14 mg Transdermal Daily Harrie Foreman, MD   14 mg at 11/14/18 0844  . ondansetron (ZOFRAN) tablet 4 mg  4 mg Oral Q6H PRN Harrie Foreman, MD       Or  . ondansetron [facility]) injection 4 mg  4 mg Intravenous Q6H PRN Harrie Foreman, MD      . oxyCODONE (Oxy IR/ROXICODONE) immediate release tablet 10 mg  10 mg Oral Q6H PRN Vaughan Basta, MD   10 mg at 11/14/18 0846  . senna-docusate (Senokot-S) tablet 1 tablet  1 tablet Oral BID Harrie Foreman, MD      . sevelamer carbonate (RENVELA) tablet 2,400 mg  2,400 mg Oral TID WC Kolluru, Sarath, MD      . terazosin (HYTRIN) capsule 5 mg  5 mg Oral QHS Harrie Foreman, MD      . traZODone (DESYREL) tablet 100 mg  100 mg Oral QHS PRN Harrie Foreman, MD        Musculoskeletal: Strength & Muscle Tone: Unable to assess Gait & Station: Unable to assess, patient completing dialysis Patient leans: N/A  Psychiatric Specialty Exam: Physical Exam  Nursing note and vitals reviewed. Constitutional: He appears well-developed and well-nourished.  HENT:  Head: Normocephalic and atraumatic.  Eyes: Pupils are equal, round, and reactive to light. Conjunctivae are normal.  Neck: Normal range of motion.  Cardiovascular: Normal heart sounds.  Respiratory: Effort normal. No respiratory distress.  GI: Soft.  Musculoskeletal: Normal range of motion.  Neurological: He is alert.  Skin: Skin is warm and dry.  Psychiatric: His speech is normal and behavior is normal. His affect is blunt. Thought content is not  paranoid. He expresses no homicidal ideation. He expresses no suicidal plans.    Review of Systems  Constitutional: Negative.   HENT: Negative.   Eyes: Negative.   Respiratory: Negative.   Cardiovascular: Negative.   Gastrointestinal: Negative.   Musculoskeletal: Positive for back pain.  Skin: Negative.   Neurological: Negative.   Psychiatric/Behavioral: Negative for depression, hallucinations, memory loss, substance abuse and suicidal ideas. The patient is not nervous/anxious and does not have insomnia.     Blood pressure (!) 165/110, pulse 91, temperature 97.6 F (36.4 C), temperature source Oral, resp. rate 18, height 6' (1.829 m), weight 136.1 kg, SpO2 100 %.Body mass index is 40.69 kg/m.  General Appearance: Casual  Eye Contact:  Good  Speech:  Slow  Volume:  Normal  Mood:  Euthymic  Affect:  Blunt  Thought Process:  Goal Directed  Orientation:  Other:  Person, place, situation  Thought Content:  Logical  Suicidal Thoughts:  No  Homicidal Thoughts:  No  Memory:  Immediate;   Fair Recent;   Fair Remote;   Fair  Judgement:  Fair  Insight:  Fair  Psychomotor Activity:  Decreased  Concentration:  Concentration: Fair  Recall:  [employer] of Knowledge:  Fair  Language:  Fair  Akathisia:  No  Handed:  Right  AIMS (if indicated):     Assets:  Physical Health Resilience  ADL's:  Intact  Cognition:  Impaired,  Mild  Sleep:   Reports adequate    Treatment Plan Summary: Capacity evaluation:  On interview today the patient was able to describe his medical condition, and desire for treatment. Patient was cooperative and answered questions appropriately.  He does appear to have some mild cognitive impairment at baseline.  At this time patient does have capacity for medical decision making.  Of note, capacity can fluctuate, and future assessment may be indicated. I have encouraged him to clarify a family member to be a power of attorney for medical decision making and the  context of his not being able to speak for himself.   Recommend consult with chaplain for MOST planning/advanced directive.  Disposition: Patient does not meet criteria for psychiatric inpatient admission. Supportive therapy provided about ongoing stressors.  Lavella Hammock, MD 11/14/2018 11:46 AM

## 2018-11-14 NOTE — Progress Notes (Signed)
[place] [facility] [place] NAME: [name]    MR#:  [id number]  DATE OF BIRTH:  1956/05/14  SUBJECTIVE:  CHIEF COMPLAINT:   Chief Complaint  Patient presents with  . Shortness of Breath   Missed HD, came with SOB.   No complains. REVIEW OF SYSTEMS:  CONSTITUTIONAL: No fever, fatigue or weakness.  EYES: No blurred or double vision.  EARS, NOSE, AND THROAT: No tinnitus or ear pain.  RESPIRATORY: No cough, shortness of breath, wheezing or hemoptysis.  CARDIOVASCULAR: No chest pain, orthopnea, edema.  GASTROINTESTINAL: No nausea, vomiting, diarrhea or abdominal pain.  GENITOURINARY: No dysuria, hematuria.  ENDOCRINE: No polyuria, nocturia,  HEMATOLOGY: No anemia, easy bruising or bleeding SKIN: No rash or lesion. MUSCULOSKELETAL: No joint pain or arthritis.   NEUROLOGIC: No tingling, numbness, weakness.  PSYCHIATRY: No anxiety or depression.   ROS  DRUG ALLERGIES:   Allergies  Allergen Reactions  . Cyclobenzaprine Nausea Only  . Clonidine Other (See Comments)    Asymptomatic bradycardia to 38  . Furosemide Other (See Comments)    Caused renal failure  . Tylenol [Acetaminophen] Other (See Comments)    Reaction:  Pt states it bothers his liver    VITALS:  Blood pressure (!) 164/98, pulse 73, temperature 98.6 F (37 C), temperature source Oral, resp. rate 14, height 6' (1.829 m), weight 136.1 kg, SpO2 100 %.  PHYSICAL EXAMINATION:  GENERAL:  63 y.o.-year-old patient lying in the bed with no acute distress.  EYES: Pupils equal, round, reactive to light and accommodation. No scleral icterus. Extraocular muscles intact.  HEENT: Head atraumatic, normocephalic. Oropharynx and nasopharynx clear.  NECK:  Supple, no jugular venous distention. No thyroid enlargement, no tenderness.  LUNGS: Normal breath sounds bilaterally, no wheezing, have b/l crepitation. No use of accessory muscles of respiration.  CARDIOVASCULAR: S1, S2 normal. No  murmurs, rubs, or gallops.  ABDOMEN: Soft, nontender, nondistended. Bowel sounds present. No organomegaly or mass.  EXTREMITIES: positive for pedal edema, no cyanosis, or clubbing.  NEUROLOGIC: Cranial nerves II through XII are intact. Muscle strength 4/5 in all extremities. Sensation intact. Gait not checked.  PSYCHIATRIC: The patient is alert and oriented x 3.  SKIN: No obvious rash, lesion, or ulcer.   Physical Exam LABORATORY PANEL:   CBC Recent Labs  Lab 11/14/18 0002  WBC 6.2  HGB 9.4*  HCT 29.0*  PLT 113*   ------------------------------------------------------------------------------------------------------------------  Chemistries  Recent Labs  Lab 11/14/18 0002  NA 139  K 4.9  CL 104  CO2 24  GLUCOSE 89  BUN 56*  CREATININE 10.38*  CALCIUM 7.7*   ------------------------------------------------------------------------------------------------------------------  Cardiac Enzymes No results for input(s): TROPONINI in the last 168 hours. ------------------------------------------------------------------------------------------------------------------  RADIOLOGY:  Dg Chest Port 1 View  Result Date: 11/13/2018 CLINICAL DATA:  Bilateral leg swelling and short of breath EXAM: PORTABLE CHEST 1 VIEW COMPARISON:  08/27/2018, 10/05/2016 FINDINGS: Mediastinal silhouette exaggerated by patient rotation. Cardiomegaly with vascular congestion. No large effusion. No focal consolidation or effusion. IMPRESSION: Cardiomegaly with mild central vascular congestion. Electronically Signed   By: Donavan Foil M.D.   On: 11/13/2018 23:48    ASSESSMENT AND PLAN:   Principal Problem:   ESRD (end stage renal disease) on dialysis [facility]) Active Problems:   Antisocial personality disorder ([place])   Noncompliance  This is a 63 year old male admitted for fluid overload due to renal failure. 1.  End-stage renal disease: On dialysis: Fluid overload secondary to missed dialysis for 2 weeks.  Renvela with meals.  Consult nephrology for continuation of dialysis, case manager to help arranging hemodialysis. 2.  Hypertension: Uncontrolled; continue hydralazine and amlodipine. 3.  CAD: Stable; continue aspirin, Plavix and Imdur 4.  Respiratory failure: Chronic; with hypoxia.  Continue supplemental oxygen per home regimen 5.  BPH: Continue terazosin 6.  DVT prophylaxis: Heparin 7.  GI prophylaxis: None  Patient was noncompliant with his hemodialysis and follow-ups and he keeps changing his story, called psych consult to decide his capacity to make decisions so that will help case manager to make arrangements on discharge.   All the records are reviewed and case discussed with Care Management/Social Workerr. Management plans discussed with the patient, family and they are in agreement.  CODE STATUS: Full.  TOTAL TIME TAKING CARE OF THIS PATIENT: 35 minutes.    POSSIBLE D/C IN 1-2 DAYS, DEPENDING ON CLINICAL CONDITION.   Vaughan Basta M.D on 11/14/2018   Between 7am to 6pm - Pager - [phone]  After 6pm go to [email or web address] - password [email or web address] [email or web address]  Office  [phone]  CC: Primary care physician; Patient, No Pcp Per  Note: This dictation was prepared with Dragon dictation along with smaller phrase technology. Any transcriptional errors that result from this process are unintentional.

## 2018-11-15 DIAGNOSIS — N2581 Secondary hyperparathyroidism of renal origin: Secondary | ICD-10-CM | POA: Diagnosis present

## 2018-11-15 DIAGNOSIS — E8770 Fluid overload, unspecified: Secondary | ICD-10-CM | POA: Diagnosis present

## 2018-11-15 DIAGNOSIS — N186 End stage renal disease: Secondary | ICD-10-CM | POA: Diagnosis present

## 2018-11-15 DIAGNOSIS — I2510 Atherosclerotic heart disease of native coronary artery without angina pectoris: Secondary | ICD-10-CM | POA: Diagnosis present

## 2018-11-15 DIAGNOSIS — Z992 Dependence on renal dialysis: Secondary | ICD-10-CM | POA: Diagnosis not present

## 2018-11-15 DIAGNOSIS — J9611 Chronic respiratory failure with hypoxia: Secondary | ICD-10-CM | POA: Diagnosis present

## 2018-11-15 DIAGNOSIS — I252 Old myocardial infarction: Secondary | ICD-10-CM | POA: Diagnosis not present

## 2018-11-15 DIAGNOSIS — Z86711 Personal history of pulmonary embolism: Secondary | ICD-10-CM | POA: Diagnosis not present

## 2018-11-15 DIAGNOSIS — Z7902 Long term (current) use of antithrombotics/antiplatelets: Secondary | ICD-10-CM | POA: Diagnosis not present

## 2018-11-15 DIAGNOSIS — F602 Antisocial personality disorder: Secondary | ICD-10-CM

## 2018-11-15 DIAGNOSIS — Z9119 Patient's noncompliance with other medical treatment and regimen: Secondary | ICD-10-CM | POA: Diagnosis not present

## 2018-11-15 DIAGNOSIS — Z79891 Long term (current) use of opiate analgesic: Secondary | ICD-10-CM | POA: Diagnosis not present

## 2018-11-15 DIAGNOSIS — Z96649 Presence of unspecified artificial hip joint: Secondary | ICD-10-CM | POA: Diagnosis present

## 2018-11-15 DIAGNOSIS — Z79899 Other long term (current) drug therapy: Secondary | ICD-10-CM | POA: Diagnosis not present

## 2018-11-15 DIAGNOSIS — Z7952 Long term (current) use of systemic steroids: Secondary | ICD-10-CM | POA: Diagnosis not present

## 2018-11-15 DIAGNOSIS — Z1159 Encounter for screening for other viral diseases: Secondary | ICD-10-CM | POA: Diagnosis not present

## 2018-11-15 DIAGNOSIS — Z7982 Long term (current) use of aspirin: Secondary | ICD-10-CM | POA: Diagnosis not present

## 2018-11-15 DIAGNOSIS — D696 Thrombocytopenia, unspecified: Secondary | ICD-10-CM | POA: Diagnosis present

## 2018-11-15 DIAGNOSIS — I120 Hypertensive chronic kidney disease with stage 5 chronic kidney disease or end stage renal disease: Secondary | ICD-10-CM | POA: Diagnosis present

## 2018-11-15 DIAGNOSIS — N400 Enlarged prostate without lower urinary tract symptoms: Secondary | ICD-10-CM | POA: Diagnosis present

## 2018-11-15 DIAGNOSIS — Z9115 Patient's noncompliance with renal dialysis: Secondary | ICD-10-CM | POA: Diagnosis not present

## 2018-11-15 DIAGNOSIS — D631 Anemia in chronic kidney disease: Secondary | ICD-10-CM | POA: Diagnosis present

## 2018-11-15 DIAGNOSIS — F329 Major depressive disorder, single episode, unspecified: Secondary | ICD-10-CM | POA: Diagnosis present

## 2018-11-15 DIAGNOSIS — F17210 Nicotine dependence, cigarettes, uncomplicated: Secondary | ICD-10-CM | POA: Diagnosis present

## 2018-11-15 LAB — RENAL FUNCTION PANEL
Albumin: 3.1 g/dL — ABNORMAL LOW (ref 3.5–5.0)
Anion gap: 11 (ref 5–15)
BUN: 40 mg/dL — ABNORMAL HIGH (ref 6–20)
CO2: 27 mmol/L (ref 22–32)
Calcium: 7.9 mg/dL — ABNORMAL LOW (ref 8.9–10.3)
Chloride: 100 mmol/L (ref 98–111)
Creatinine, Ser: 7.93 mg/dL — ABNORMAL HIGH (ref 0.61–1.24)
GFR calc Af Amer: 8 mL/min — ABNORMAL LOW (ref >60)
GFR calc non Af Amer: 7 mL/min — ABNORMAL LOW (ref >60)
Glucose, Bld: 114 mg/dL — ABNORMAL HIGH (ref 70–99)
Phosphorus: 4.7 mg/dL — ABNORMAL HIGH (ref 2.5–4.6)
Potassium: 3.9 mmol/L (ref 3.5–5.1)
Sodium: 138 mmol/L (ref 135–145)

## 2018-11-15 MED ORDER — ACETAMINOPHEN 325 MG PO TABS
650.0000 mg | ORAL_TABLET | ORAL | Status: DC | PRN
  Administered 2018-11-15: 02:00:00 650 mg via ORAL
  Filled 2018-11-15: qty 2

## 2018-11-15 MED ORDER — SALINE SPRAY 0.65 % NA SOLN
1.0000 | NASAL | Status: DC | PRN
  Administered 2018-11-16: 1 via NASAL
  Filled 2018-11-15: qty 44

## 2018-11-15 NOTE — Progress Notes (Signed)
DC familiar with this patient, home clinic is [facility]  [place] [facility]. DC set patient up last admissions in February at [facility], however patient never went to any scheduled treatments. DC spoke with [facility] clinic and they are willing to accept patient back. Waiting on Hep B antigen to result and insurance verification.

## 2018-11-15 NOTE — Consult Note (Signed)
[facility] Consult   Reason for Consult:  Capacity Referring Physician:  Dr. Anselm Jungling Patient Identification: [name] MRN:  [id number] Principal Diagnosis: ESRD (end stage renal disease) on dialysis [facility]) Diagnosis:   Patient Active Problem List   Diagnosis Date Noted  . 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 HCAP (healthcare-associated pneumonia) [J18.9] 08/27/2018  . Shortness of breath [R06.02] 08/25/2018  . Chest pain [R07.9] 08/20/2018  . ESRD on dialysis ([place]) [N18.6, Z99.2] 11/08/2016  . Prostate cancer screening [Z12.5] 11/08/2016  . Acquired cyst of kidney [N28.1] 11/08/2016  . Urinary urgency [R39.15] 11/08/2016  . Acute on chronic renal failure ([place]) [N17.9, N18.9] 09/23/2016  . Hypertensive urgency [I16.0] 09/21/2016  . Dysthymia [F34.1] 02/26/2016  . Chronic pain [G89.29] 02/26/2016  . Noncompliance [Z91.19] 02/26/2016  . Opiate abuse, continuous ([place]) [F11.10] 02/03/2016  . Substance induced mood disorder ([place]) [F19.94] 02/03/2016  . Antisocial personality disorder ([place]) [F60.2] 02/03/2016  . Left-sided weakness [R53.1] 01/18/2016  . HEPATITIS C [B17.10] 06/28/2008  . HLD (hyperlipidemia) [E78.5] 06/28/2008  . SUBSTANCE ABUSE, MULTIPLE [F19.10] 06/28/2008  . Hypertension [I10] 06/27/2008   Patient interviewed. Chart reviewed. Labs and vitals reviewed. Total Time spent with patient: 35 minutes  Subjective: "I sometimes get down, but I do not want to take more medicine."  HPI:  [name] is a 63 y.o. male patient admitted with Chief Complaint: Shortness of breath The patient with past medical history of end-stage renal disease on dialysis, hypertension and CAD presents to the emergency department complaining of shortness of breath.  The patient states that he feels as if he has too much fluid on him.  He acknowledges that he has missed dialysis for nearly 2 weeks because he "was not feeling  well".  He denies cough.  Also denies travel risk factors or known contact with individuals with exposure to novel coronavirus.  Upon arrival the patient was on 3 L of oxygen via nasal cannula which is baseline for him.  The patient's lower extremities were grossly edematous as well.  He does not know his dry weight.  Laboratory evaluation significant for elevated creatinine and BNP.  Due to symptomatic fluid overload emergency department staff call hospitalist service for admission.   Psychiatry consultation is requested to determine patient capacity for treatment.  On initial evaluation 11/14/2018, patient is completing dialysis treatment.  He is calm and cooperative.  Patient states that he wishes to continue receiving dialysis treatment.  He is able to state understanding that if he is to discontinue dialysis treatment he will die of end-stage renal disease.  Patient specifically states that he does not wish to die of end-stage renal disease.  He denies any suicidal ideation, plan or intent.  He denies any homicidal ideation.  He denies any auditory or visual hallucinations.  Patient denies any past history of self-harm or suicide attempts.  He denies having access to a weapon.  Patient reports that he had moved to [place], hence missed 2 weeks of treatment.  He states that he intends to continue living in [place] now with his parents.  Patient reports good appetite and adequate sleep.  He denies symptoms of depression.  He denies past symptoms of mania or psychosis.  Patient is alert and oriented, however is uncertain of date.  11/15/2018: On reevaluation today, patient is awake and alert.  He is sitting at bedside.  Patient endorses that he does have episodes were, "my mood gets down.  I feel okay, I just do not feel like I want to go to dialysis.  I never feel really bad when I miss dialysis."  Denies that it is lack of motivation or depression that keeps him from coming to dialysis.  He denies  having transportation difficulties.  Patient reports good appetite and sleep.  He states he has good energy levels and concentration.  He has been limited in his ability to be active due to COVID-19 quarantine.  However, he reports that he does enjoy his life.  Patient denies SI, HI, AVH.  He does not desire to take more medication, however notes that "Ativan has worked well for me in the past and making me feel more up."  Reviewed with him dependence effect of Ativan as well as risk of decreasing cognition and increasing falls, and would advise against taking benzodiazepines.  Answered all patient's questions.  Per chart review with updates. Social history: Patient reports that he lives with his parents. This is been what he says every time we've seen him come through here. He is on disability  Medical history: Had a hip replacement.  Chronic hip and back pain.   Substance abuse history: Patient is frequently overly concerned about his narcotics and we've had frequent evidence that he takes more than what he is prescribed or inappropriate doses of narcotics. He frequently doctor shops for narcotics.   Past Psychiatric History: Patient has had many presentations to the emergency room. He's had a couple of admissions to psychiatry. He has never actually done anything to try to kill himself. No history known of any violence. He has been referred over and over to local mental health is no evidence that he never follows up with it.  Risk to Self:  Denies Risk to Others:  Denies Prior Inpatient Therapy:  Yes Prior Outpatient Therapy:  Noncompliant, patient does not desire follow-up with outpatient psychiatry  Past Medical History:  Past Medical History:  Diagnosis Date  . Depression   . Heart attack ([place])   . Hypertension   . PE (pulmonary embolism)   . Renal disorder     Past Surgical History:  Procedure Laterality Date  . TOTAL HIP ARTHROPLASTY     Family History: No family history on  file. Family Psychiatric  History: Patient denies knowing of any family history of mental illness  Social History:  Social History   Substance and Sexual Activity  Alcohol Use No     Social History   Substance and Sexual Activity  Drug Use No   Comment: pt has been positive for cocaine twice this week but denies use    Social History   Socioeconomic History  . Marital status: Divorced    Spouse name: Not on file  . Number of children: Not on file  . Years of education: Not on file  . Highest education level: Not on file  Occupational History  . Not on file  Social Needs  . Financial resource strain: Not on file  . Food insecurity:    Worry: Not on file    Inability: Not on file  . Transportation needs:    Medical: Not on file    Non-medical: Not on file  Tobacco Use  . Smoking status: Current Every Day Smoker    Packs/day: 0.50    Types: Cigarettes  . Smokeless tobacco: Never Used  Substance and Sexual Activity  . Alcohol use: No  . Drug use: No    Comment: pt has  been positive for cocaine twice this week but denies use  . Sexual activity: Not on file  Lifestyle  . Physical activity:    Days per week: Not on file    Minutes per session: Not on file  . Stress: Not on file  Relationships  . Social connections:    Talks on phone: Not on file    Gets together: Not on file    Attends religious service: Not on file    Active member of club or organization: Not on file    Attends meetings of clubs or organizations: Not on file    Relationship status: Not on file  Other Topics Concern  . Not on file  Social History Narrative  . Not on file   Additional Social History:    Lives with parents  Allergies:   Allergies  Allergen Reactions  . Cyclobenzaprine Nausea Only  . Clonidine Other (See Comments)    Asymptomatic bradycardia to 38  . Furosemide Other (See Comments)    Caused renal failure  . Tylenol [Acetaminophen] Other (See Comments)    Reaction:  Pt  states it bothers his liver    Labs:  Results for orders placed or performed during the hospital encounter of 11/13/18 (from the past 48 hour(s))  CBC with Differential     Status: Abnormal   Collection Time: 11/14/18 12:02 AM  Result Value Ref Range   WBC 6.2 4.0 - 10.5 K/uL   RBC 3.28 (L) 4.22 - 5.81 MIL/uL   Hemoglobin 9.4 (L) 13.0 - 17.0 g/dL   HCT 29.0 (L) 39.0 - 52.0 %   MCV 88.4 80.0 - 100.0 fL   MCH 28.7 26.0 - 34.0 pg   MCHC 32.4 30.0 - 36.0 g/dL   RDW 15.9 (H) 11.5 - 15.5 %   Platelets 113 (L) 150 - 400 K/uL    Comment: PLATELET COUNT CONFIRMED BY SMEAR Immature Platelet Fraction may be clinically indicated, consider ordering this additional test [id number]    nRBC 0.3 (H) 0.0 - 0.2 %   Neutrophils Relative % 67 %   Neutro Abs 4.2 1.7 - 7.7 K/uL   Lymphocytes Relative 20 %   Lymphs Abs 1.2 0.7 - 4.0 K/uL   Monocytes Relative 10 %   Monocytes Absolute 0.6 0.1 - 1.0 K/uL   Eosinophils Relative 1 %   Eosinophils Absolute 0.1 0.0 - 0.5 K/uL   Basophils Relative 1 %   Basophils Absolute 0.0 0.0 - 0.1 K/uL   Immature Granulocytes 1 %   Abs Immature Granulocytes 0.05 0.00 - 0.07 K/uL    Comment: Performed at [facility], [address] [place], [place] [address]  Basic metabolic panel     Status: Abnormal   Collection Time: 11/14/18 12:02 AM  Result Value Ref Range   Sodium 139 135 - 145 mmol/L   Potassium 4.9 3.5 - 5.1 mmol/L   Chloride 104 98 - 111 mmol/L   CO2 24 22 - 32 mmol/L   Glucose, Bld 89 70 - 99 mg/dL   BUN 56 (H) 6 - 20 mg/dL   Creatinine, Ser 10.38 (H) 0.61 - 1.24 mg/dL   Calcium 7.7 (L) 8.9 - 10.3 mg/dL   GFR calc non Af Amer 5 (L) >60 mL/min   GFR calc Af Amer 6 (L) >60 mL/min   Anion gap 11 5 - 15    Comment: Performed at [facility], [address], [place], [place] [address]  Brain natriuretic peptide  Status: Abnormal   Collection Time: 11/14/18 12:02 AM  Result Value Ref Range   B Natriuretic Peptide 4,024.0 (H) 0.0  - 100.0 pg/mL    Comment: Performed at [facility], [address], [place], [place] [address]  SARS Coronavirus 2 (CEPHEID- Performed in [place] hospital lab), Hosp Order     Status: None   Collection Time: 11/14/18 12:02 AM  Result Value Ref Range   SARS Coronavirus 2 NEGATIVE NEGATIVE    Comment: (NOTE) If result is NEGATIVE SARS-CoV-2 target nucleic acids are NOT DETECTED. The SARS-CoV-2 RNA is generally detectable in upper and lower  respiratory specimens during the acute phase of infection. The lowest  concentration of SARS-CoV-2 viral copies this assay can detect is 250  copies / mL. A negative result does not preclude SARS-CoV-2 infection  and should not be used as the sole basis for treatment or other  patient management decisions.  A negative result may occur with  improper specimen collection / handling, submission of specimen other  than nasopharyngeal swab, presence of viral mutation(s) within the  areas targeted by this assay, and inadequate number of viral copies  (<250 copies / mL). A negative result must be combined with clinical  observations, patient history, and epidemiological information. If result is POSITIVE SARS-CoV-2 target nucleic acids are DETECTED. The SARS-CoV-2 RNA is generally detectable in upper and lower  respiratory specimens dur ing the acute phase of infection.  Positive  results are indicative of active infection with SARS-CoV-2.  Clinical  correlation with patient history and other diagnostic information is  necessary to determine patient infection status.  Positive results do  not rule out bacterial infection or co-infection with other viruses. If result is PRESUMPTIVE POSTIVE SARS-CoV-2 nucleic acids MAY BE PRESENT.   A presumptive positive result was obtained on the submitted specimen  and confirmed on repeat testing.  While 2019 novel coronavirus  (SARS-CoV-2) nucleic acids may be present in the submitted sample  additional  confirmatory testing may be necessary for epidemiological  and / or clinical management purposes  to differentiate between  SARS-CoV-2 and other Sarbecovirus currently known to infect humans.  If clinically indicated additional testing with an alternate test  methodology [phone]) is advised. The SARS-CoV-2 RNA is generally  detectable in upper and lower respiratory sp ecimens during the acute  phase of infection. The expected result is Negative. Fact Sheet for Patients:  StrictlyIdeas.no Fact Sheet for Healthcare Providers: BankingDealers.co.za This test is not yet approved or cleared by the [place] FDA and has been authorized for detection and/or diagnosis of SARS-CoV-2 by FDA under an Emergency Use Authorization (EUA).  This EUA will remain in effect (meaning this test can be used) for the duration of the COVID-19 declaration under Section 564(b)(1) of the Act, 21 U.S.C. section 360bbb-3(b)(1), unless the authorization is terminated or revoked sooner. Performed at [facility], [address] [place], [place] [address]   Albumin     Status: None   Collection Time: 11/14/18 12:02 AM  Result Value Ref Range   Albumin 3.5 3.5 - 5.0 g/dL    Comment: Performed at [facility], [address] [place], [place] [address]  TSH     Status: None   Collection Time: 11/14/18 12:02 AM  Result Value Ref Range   TSH 0.968 0.350 - 4.500 uIU/mL    Comment: Performed by a 3rd Generation assay with a functional sensitivity of <=0.01 uIU/mL. Performed at [facility], [place]  [address] [place], [place] [address]   MRSA PCR Screening     Status: None   Collection Time: 11/14/18  4:48 AM  Result Value Ref Range   MRSA by PCR NEGATIVE NEGATIVE    Comment:        The GeneXpert MRSA Assay (FDA approved for NASAL specimens only), is one component of a comprehensive MRSA colonization surveillance program. It is  not intended to diagnose MRSA infection nor to guide or monitor treatment for MRSA infections. Performed at [facility], [address] [place], [place] [address]   Renal function panel     Status: Abnormal   Collection Time: 11/15/18  9:21 AM  Result Value Ref Range   Sodium 138 135 - 145 mmol/L   Potassium 3.9 3.5 - 5.1 mmol/L   Chloride 100 98 - 111 mmol/L   CO2 27 22 - 32 mmol/L   Glucose, Bld 114 (H) 70 - 99 mg/dL   BUN 40 (H) 6 - 20 mg/dL   Creatinine, Ser 7.93 (H) 0.61 - 1.24 mg/dL   Calcium 7.9 (L) 8.9 - 10.3 mg/dL   Phosphorus 4.7 (H) 2.5 - 4.6 mg/dL   Albumin 3.1 (L) 3.5 - 5.0 g/dL   GFR calc non Af Amer 7 (L) >60 mL/min   GFR calc Af Amer 8 (L) >60 mL/min   Anion gap 11 5 - 15    Comment: Performed at [facility], [address] [place], [place] [address]    Current Facility-Administered Medications  Medication Dose Route Frequency Provider Last Rate Last Dose  . acetaminophen (TYLENOL) tablet 650 mg  650 mg Oral Q4H PRN Mansy, Jan A, MD   650 mg at 11/15/18 0220  . albuterol (PROVENTIL) (2.5 MG/3ML) 0.083% nebulizer solution 2.5 mg  2.5 mg Nebulization Q4H PRN Harrie Foreman, MD   2.5 mg at 11/14/18 1944  . amLODipine (NORVASC) tablet 10 mg  10 mg Oral Daily Harrie Foreman, MD   10 mg at 11/15/18 0933  . aspirin EC tablet 81 mg  81 mg Oral Daily Harrie Foreman, MD   81 mg at 11/15/18 0932  . clopidogrel (PLAVIX) tablet 75 mg  75 mg Oral Daily Harrie Foreman, MD   75 mg at 11/15/18 0932  . docusate sodium (COLACE) capsule 100 mg  100 mg Oral BID Harrie Foreman, MD   100 mg at 11/15/18 0932  . epoetin alfa (EPOGEN) injection 10,000 Units  10,000 Units Intravenous Q M,W,F-HD Kolluru, Sarath, MD      . heparin injection 5,000 Units  5,000 Units Subcutaneous Q8H Harrie Foreman, MD   5,000 Units at 11/15/18 [phone]  . hydrALAZINE (APRESOLINE) tablet 100 mg  100 mg Oral TID Harrie Foreman, MD   100 mg at 11/15/18 0934  .  isosorbide mononitrate (IMDUR) 24 hr tablet 60 mg  60 mg Oral Daily Harrie Foreman, MD   Stopped at 11/14/18 1020  . MEDLINE mouth rinse  15 mL Mouth Rinse BID Vaughan Basta, MD   15 mL at 11/14/18 2113  . nicotine (NICODERM CQ - dosed in mg/24 hours) patch 14 mg  14 mg Transdermal Daily Harrie Foreman, MD   14 mg at 11/15/18 0934  . ondansetron (ZOFRAN) tablet 4 mg  4 mg Oral Q6H PRN Harrie Foreman, MD       Or  . ondansetron [facility]) injection 4 mg  4 mg Intravenous Q6H PRN Harrie Foreman, MD      .  oxyCODONE (Oxy IR/ROXICODONE) immediate release tablet 10 mg  10 mg Oral Q6H PRN Vaughan Basta, MD   10 mg at 11/15/18 1234  . oxyCODONE (OXYCONTIN) 12 hr tablet 20 mg  20 mg Oral Q12H Vaughan Basta, MD   20 mg at 11/15/18 0536  . senna-docusate (Senokot-S) tablet 1 tablet  1 tablet Oral BID Harrie Foreman, MD   1 tablet at 11/15/18 [phone]  . sevelamer carbonate (RENVELA) tablet 2,400 mg  2,400 mg Oral TID WC Kolluru, Sarath, MD   2,400 mg at 11/15/18 1234  . terazosin (HYTRIN) capsule 5 mg  5 mg Oral QHS Harrie Foreman, MD   5 mg at 11/14/18 2111  . traZODone (DESYREL) tablet 100 mg  100 mg Oral QHS PRN Harrie Foreman, MD        Musculoskeletal: Strength & Muscle Tone: Unable to assess Gait & Station: Unable to assess, patient completing dialysis Patient leans: N/A  Psychiatric Specialty Exam: Physical Exam  Nursing note and vitals reviewed. Constitutional: He appears well-developed and well-nourished. No distress.  HENT:  Head: Normocephalic and atraumatic.  Eyes: EOM are normal.  Neck: Normal range of motion.  Cardiovascular: Normal rate and regular rhythm.  Respiratory: Effort normal. No respiratory distress.  Musculoskeletal: Normal range of motion.  Neurological: He is alert.  Psychiatric: His speech is normal and behavior is normal. His affect is blunt. Thought content is not paranoid. He expresses no homicidal ideation. He  expresses no suicidal plans.    Review of Systems  Constitutional: Negative.   HENT: Negative.   Eyes: Negative.   Respiratory: Negative.   Cardiovascular: Negative.   Gastrointestinal: Negative.   Musculoskeletal: Positive for back pain.  Skin: Negative.   Neurological: Negative.   Psychiatric/Behavioral: Negative for depression, hallucinations, memory loss, substance abuse and suicidal ideas. The patient is not nervous/anxious and does not have insomnia.     Blood pressure (!) 141/80, pulse 70, temperature 98.2 F (36.8 C), temperature source Oral, resp. rate 16, height 6' (1.829 m), weight 129 kg, SpO2 98 %.Body mass index is 38.57 kg/m.  General Appearance: Casual  Eye Contact:  Good  Speech:  Slow  Volume:  Normal  Mood:  Euthymic  Affect:  Blunt  Thought Process:  Goal Directed  Orientation:  Other:  Person, place, situation  Thought Content:  Logical  Suicidal Thoughts:  No  Homicidal Thoughts:  No  Memory:  Immediate;   Fair Recent;   Fair Remote;   Fair  Judgement:  Fair  Insight:  Fair  Psychomotor Activity:  Decreased  Concentration:  Concentration: Fair  Recall:  [employer] of Knowledge:  Fair  Language:  Fair  Akathisia:  No  Handed:  Right  AIMS (if indicated):     Assets:  Physical Health Resilience  ADL's:  Intact  Cognition:  Impaired,  Mild  Sleep:   Reports adequate    Treatment Plan Summary: Capacity evaluation:  Patient continues to be able to describe his medical condition, and desire for treatment. Patient was cooperative and answered questions appropriately.  He does appear to have some mild cognitive impairment at baseline.  At this time patient does have capacity for medical decision making.  Of note, capacity can fluctuate, and future assessment may be indicated. I have encouraged him to clarify a family member to be a power of attorney for medical decision making and the context of his not being able to speak for himself.   Recommend  consult with chaplain  for MOST planning/advanced directive.  Reviewed treatment of depressed mood with medication, however patient does not desire medication at this time.  He could also benefit from psychotherapy, but is not interested at this time.  Again strongly encourage chaplain for counseling as well as for advanced directive planning.  Disposition: Patient does not meet criteria for psychiatric inpatient admission. Supportive therapy provided about ongoing stressors.  Lavella Hammock, MD 11/15/2018 1:52 PM

## 2018-11-15 NOTE — Progress Notes (Signed)
[facility] [place] NAME: [name]    MR#:  [id number]  DATE OF BIRTH:  Aug 27, 1955  SUBJECTIVE:  CHIEF COMPLAINT:   Chief Complaint  Patient presents with  . Shortness of Breath   Missed HD, came with SOB. No complains.  Received hemodialysis yesterday.  Plan for 1 more hemodialysis tomorrow by nephrology.  REVIEW OF SYSTEMS:   CONSTITUTIONAL: No fever, fatigue or weakness.  EYES: No blurred or double vision.  EARS, NOSE, AND THROAT: No tinnitus or ear pain.  RESPIRATORY: No cough, shortness of breath, wheezing or hemoptysis.  CARDIOVASCULAR: No chest pain, orthopnea, edema.  GASTROINTESTINAL: No nausea, vomiting, diarrhea or abdominal pain.  GENITOURINARY: No dysuria, hematuria.  ENDOCRINE: No polyuria, nocturia,  HEMATOLOGY: No anemia, easy bruising or bleeding SKIN: No rash or lesion. MUSCULOSKELETAL: No joint pain or arthritis.   NEUROLOGIC: No tingling, numbness, weakness.  PSYCHIATRY: No anxiety or depression.   ROS  DRUG ALLERGIES:   Allergies  Allergen Reactions  . Cyclobenzaprine Nausea Only  . Clonidine Other (See Comments)    Asymptomatic bradycardia to 38  . Furosemide Other (See Comments)    Caused renal failure  . Tylenol [Acetaminophen] Other (See Comments)    Reaction:  Pt states it bothers his liver    VITALS:  Blood pressure (!) 141/80, pulse 70, temperature 98.2 F (36.8 C), temperature source Oral, resp. rate 16, height 6' (1.829 m), weight 129 kg, SpO2 98 %.  PHYSICAL EXAMINATION:  GENERAL:  63 y.o.-year-old patient lying in the bed with no acute distress.  EYES: Pupils equal, round, reactive to light and accommodation. No scleral icterus. Extraocular muscles intact.  HEENT: Head atraumatic, normocephalic. Oropharynx and nasopharynx clear.  NECK:  Supple, no jugular venous distention. No thyroid enlargement, no tenderness.  LUNGS: Normal breath sounds bilaterally, no wheezing, have b/l  crepitation. No use of accessory muscles of respiration.  CARDIOVASCULAR: S1, S2 normal. No murmurs, rubs, or gallops.  ABDOMEN: Soft, nontender, nondistended. Bowel sounds present. No organomegaly or mass.  EXTREMITIES: positive for pedal edema, no cyanosis, or clubbing.  NEUROLOGIC: Cranial nerves II through XII are intact. Muscle strength 4/5 in all extremities. Sensation intact. Gait not checked.  PSYCHIATRIC: The patient is alert and oriented x 3.  SKIN: No obvious rash, lesion, or ulcer.   Physical Exam LABORATORY PANEL:   CBC Recent Labs  Lab 11/14/18 0002  WBC 6.2  HGB 9.4*  HCT 29.0*  PLT 113*   ------------------------------------------------------------------------------------------------------------------  Chemistries  Recent Labs  Lab 11/15/18 0921  NA 138  K 3.9  CL 100  CO2 27  GLUCOSE 114*  BUN 40*  CREATININE 7.93*  CALCIUM 7.9*   ------------------------------------------------------------------------------------------------------------------  Cardiac Enzymes No results for input(s): TROPONINI in the last 168 hours. ------------------------------------------------------------------------------------------------------------------  RADIOLOGY:  Dg Chest Port 1 View  Result Date: 11/13/2018 CLINICAL DATA:  Bilateral leg swelling and short of breath EXAM: PORTABLE CHEST 1 VIEW COMPARISON:  08/27/2018, 10/05/2016 FINDINGS: Mediastinal silhouette exaggerated by patient rotation. Cardiomegaly with vascular congestion. No large effusion. No focal consolidation or effusion. IMPRESSION: Cardiomegaly with mild central vascular congestion. Electronically Signed   By: Donavan Foil M.D.   On: 11/13/2018 23:48    ASSESSMENT AND PLAN:   Principal Problem:   ESRD (end stage renal disease) on dialysis [facility]) Active Problems:   Antisocial personality disorder ([place])   Noncompliance  This is a 63 year old male admitted for fluid overload due to renal failure. 1.   End-stage renal  disease: On dialysis: Fluid overload secondary to missed dialysis for 2 weeks.  Renvela with meals.  Consult nephrology for continuation of dialysis, case manager to help arranging hemodialysis. As per nephrologist there is still no arrangement of outpatient hemodialysis. 2.  Hypertension: Uncontrolled; continue hydralazine and amlodipine. 3.  CAD: Stable; continue aspirin, Plavix and Imdur 4.  Respiratory failure: Chronic; with hypoxia.  Continue supplemental oxygen per home regimen 5.  BPH: Continue terazosin 6.  DVT prophylaxis: Heparin. 7.  GI prophylaxis: None  Patient was noncompliant with his hemodialysis and follow-ups and he keeps changing his story, called psych consult to decide his capacity to make decisions so that will help case manager to make arrangements on discharge.   All the records are reviewed and case discussed with Care Management/Social Workerr. Management plans discussed with the patient, family and they are in agreement.  CODE STATUS: Full.  TOTAL TIME TAKING CARE OF THIS PATIENT: 35 minutes.   POSSIBLE D/C IN 1-2 DAYS, DEPENDING ON CLINICAL CONDITION.  Vaughan Basta M.D on 11/15/2018   Between 7am to 6pm - Pager - [phone]  After 6pm go to [email or web address] - password [email or web address] [place] [facility]  Office  [phone]  CC: Primary care physician; Patient, No Pcp Per  Note: This dictation was prepared with Dragon dictation along with smaller phrase technology. Any transcriptional errors that result from this process are unintentional.

## 2018-11-15 NOTE — Progress Notes (Signed)
[facility]  ROUNDING NOTE   Subjective:   Hemodialysis treatment yesterday. Tolerated treatment well. UF of 1 liter.   Patient states he is still short of breath.   Objective:  Vital signs in last 24 hours:  Temp:  [98.2 F (36.8 C)] 98.2 F (36.8 C) (05/07 1156) Pulse Rate:  [70-97] 70 (05/07 1156) Resp:  [16-20] 16 (05/07 1156) BP: (136-156)/(71-91) 141/80 (05/07 1156) SpO2:  [98 %-100 %] 98 % (05/07 1156)  Weight change: -7.079 kg Filed Weights   11/13/18 2330 11/14/18 1628  Weight: 136.1 kg 129 kg    Intake/Output: I/O last 3 completed shifts: In: 360 [P.O.:360] Out: 1000 [Other:1000]   Intake/Output this shift:  Total I/O In: 360 [P.O.:360] Out: -   Physical Exam: General: NAD,   Head: Normocephalic, atraumatic. Moist oral mucosal membranes  Eyes: Anicteric, PERRL  Neck: Supple, trachea midline  Lungs:  Clear to auscultation  Heart: Regular rate and rhythm  Abdomen:  Soft, nontender,   Extremities:  no  peripheral edema.  Neurologic: Nonfocal, moving all four extremities  Skin: No lesions  Access: Left AVF    Basic Metabolic Panel: Recent Labs  Lab 11/14/18 0002 11/15/18 0921  NA 139 138  K 4.9 3.9  CL 104 100  CO2 24 27  GLUCOSE 89 114*  BUN 56* 40*  CREATININE 10.38* 7.93*  CALCIUM 7.7* 7.9*  PHOS  --  4.7*    Liver Function Tests: Recent Labs  Lab 11/14/18 0002 11/15/18 0921  ALBUMIN 3.5 3.1*   No results for input(s): LIPASE, AMYLASE in the last 168 hours. No results for input(s): AMMONIA in the last 168 hours.  CBC: Recent Labs  Lab 11/14/18 0002  WBC 6.2  NEUTROABS 4.2  HGB 9.4*  HCT 29.0*  MCV 88.4  PLT 113*    Cardiac Enzymes: No results for input(s): CKTOTAL, CKMB, CKMBINDEX, TROPONINI in the last 168 hours.  BNP: Invalid input(s): POCBNP  CBG: No results for input(s): GLUCAP in the last 168 hours.  Microbiology: Results for orders placed or performed during the hospital encounter of 11/13/18   SARS Coronavirus 2 (CEPHEID- Performed in [place] hospital lab), Hosp Order     Status: None   Collection Time: 11/14/18 12:02 AM  Result Value Ref Range Status   SARS Coronavirus 2 NEGATIVE NEGATIVE Final    Comment: (NOTE) If result is NEGATIVE SARS-CoV-2 target nucleic acids are NOT DETECTED. The SARS-CoV-2 RNA is generally detectable in upper and lower  respiratory specimens during the acute phase of infection. The lowest  concentration of SARS-CoV-2 viral copies this assay can detect is 250  copies / mL. A negative result does not preclude SARS-CoV-2 infection  and should not be used as the sole basis for treatment or other  patient management decisions.  A negative result may occur with  improper specimen collection / handling, submission of specimen other  than nasopharyngeal swab, presence of viral mutation(s) within the  areas targeted by this assay, and inadequate number of viral copies  (<250 copies / mL). A negative result must be combined with clinical  observations, patient history, and epidemiological information. If result is POSITIVE SARS-CoV-2 target nucleic acids are DETECTED. The SARS-CoV-2 RNA is generally detectable in upper and lower  respiratory specimens dur ing the acute phase of infection.  Positive  results are indicative of active infection with SARS-CoV-2.  Clinical  correlation with patient history and other diagnostic information is  necessary to determine patient infection status.  Positive results do  not rule out bacterial infection or co-infection with other viruses. If result is PRESUMPTIVE POSTIVE SARS-CoV-2 nucleic acids MAY BE PRESENT.   A presumptive positive result was obtained on the submitted specimen  and confirmed on repeat testing.  While 2019 novel coronavirus  (SARS-CoV-2) nucleic acids may be present in the submitted sample  additional confirmatory testing may be necessary for epidemiological  and / or clinical management  purposes  to differentiate between  SARS-CoV-2 and other Sarbecovirus currently known to infect humans.  If clinically indicated additional testing with an alternate test  methodology [phone]) is advised. The SARS-CoV-2 RNA is generally  detectable in upper and lower respiratory sp ecimens during the acute  phase of infection. The expected result is Negative. Fact Sheet for Patients:  StrictlyIdeas.no Fact Sheet for Healthcare Providers: BankingDealers.co.za This test is not yet approved or cleared by the [place] FDA and has been authorized for detection and/or diagnosis of SARS-CoV-2 by FDA under an Emergency Use Authorization (EUA).  This EUA will remain in effect (meaning this test can be used) for the duration of the COVID-19 declaration under Section 564(b)(1) of the Act, 21 U.S.C. section 360bbb-3(b)(1), unless the authorization is terminated or revoked sooner. Performed at [facility], [address] [place], [place] [address]   MRSA PCR Screening     Status: None   Collection Time: 11/14/18  4:48 AM  Result Value Ref Range Status   MRSA by PCR NEGATIVE NEGATIVE Final    Comment:        The GeneXpert MRSA Assay (FDA approved for NASAL specimens only), is one component of a comprehensive MRSA colonization surveillance program. It is not intended to diagnose MRSA infection nor to guide or monitor treatment for MRSA infections. Performed at [facility], [address] [place], [place] [address]     Coagulation Studies: No results for input(s): LABPROT, INR in the last 72 hours.  Urinalysis: No results for input(s): COLORURINE, LABSPEC, PHURINE, GLUCOSEU, HGBUR, BILIRUBINUR, KETONESUR, PROTEINUR, UROBILINOGEN, NITRITE, LEUKOCYTESUR in the last 72 hours.  Invalid input(s): APPERANCEUR    Imaging: Dg Chest Port 1 View  Result Date: 11/13/2018 CLINICAL DATA:  Bilateral leg swelling and short  of breath EXAM: PORTABLE CHEST 1 VIEW COMPARISON:  08/27/2018, 10/05/2016 FINDINGS: Mediastinal silhouette exaggerated by patient rotation. Cardiomegaly with vascular congestion. No large effusion. No focal consolidation or effusion. IMPRESSION: Cardiomegaly with mild central vascular congestion. Electronically Signed   By: Donavan Foil M.D.   On: 11/13/2018 23:48     Medications:    . amLODipine  10 mg Oral Daily  . aspirin EC  81 mg Oral Daily  . clopidogrel  75 mg Oral Daily  . docusate sodium  100 mg Oral BID  . epoetin (EPOGEN/PROCRIT) injection  10,000 Units Intravenous Q M,W,F-HD  . heparin  5,000 Units Subcutaneous Q8H  . hydrALAZINE  100 mg Oral TID  . isosorbide mononitrate  60 mg Oral Daily  . mouth rinse  15 mL Mouth Rinse BID  . nicotine  14 mg Transdermal Daily  . oxyCODONE  20 mg Oral Q12H  . senna-docusate  1 tablet Oral BID  . sevelamer carbonate  2,400 mg Oral TID WC  . terazosin  5 mg Oral QHS   acetaminophen, albuterol, ondansetron **OR** ondansetron (ZOFRAN) IV, oxyCODONE, traZODone  Assessment/ Plan:  [name] is a 63 y.o. black male with end stage renal disease on hemodialysis, depression, hypertension, pulmonary embolism, substance abuse.  1. End stage renal disease: reports missing 2 weeks of hemodialysis.  - Dialysis for tomorrow. Orders prepared.  - Outpatient planning for [place] [facility] - Check viral hepatitis B screen.   2.  Anemia chronic kidney disease.  With thrombocytopenia. Hemoglobin 9.4 - EPO with HD treatment  3.  Secondary hyperparathyroidism   - Sevelamer with meals.   4. Hypertension: Home regimen of tamsulosin and labetalol.    LOS: 0 [name] 5/7/20205:22 PM

## 2018-11-16 LAB — RENAL FUNCTION PANEL
Albumin: 3 g/dL — ABNORMAL LOW (ref 3.5–5.0)
Anion gap: 11 (ref 5–15)
BUN: 43 mg/dL — ABNORMAL HIGH (ref 6–20)
CO2: 26 mmol/L (ref 22–32)
Calcium: 8.1 mg/dL — ABNORMAL LOW (ref 8.9–10.3)
Chloride: 101 mmol/L (ref 98–111)
Creatinine, Ser: 8.09 mg/dL — ABNORMAL HIGH (ref 0.61–1.24)
GFR calc Af Amer: 8 mL/min — ABNORMAL LOW (ref >60)
GFR calc non Af Amer: 7 mL/min — ABNORMAL LOW (ref >60)
Glucose, Bld: 94 mg/dL (ref 70–99)
Phosphorus: 4.8 mg/dL — ABNORMAL HIGH (ref 2.5–4.6)
Potassium: 4 mmol/L (ref 3.5–5.1)
Sodium: 138 mmol/L (ref 135–145)

## 2018-11-16 LAB — CBC
HCT: 25.5 % — ABNORMAL LOW (ref 39.0–52.0)
Hemoglobin: 8.4 g/dL — ABNORMAL LOW (ref 13.0–17.0)
MCH: 28.7 pg (ref 26.0–34.0)
MCHC: 32.9 g/dL (ref 30.0–36.0)
MCV: 87 fL (ref 80.0–100.0)
Platelets: 97 10*3/uL — ABNORMAL LOW (ref 150–400)
RBC: 2.93 MIL/uL — ABNORMAL LOW (ref 4.22–5.81)
RDW: 15.8 % — ABNORMAL HIGH (ref 11.5–15.5)
WBC: 3.9 10*3/uL — ABNORMAL LOW (ref 4.0–10.5)
nRBC: 0 % (ref 0.0–0.2)

## 2018-11-16 LAB — HEPATITIS B CORE ANTIBODY, IGM: Hep B C IgM: NEGATIVE

## 2018-11-16 LAB — HEPATITIS B SURFACE ANTIGEN: Hepatitis B Surface Ag: NEGATIVE

## 2018-11-16 LAB — HEPATITIS B SURFACE ANTIBODY,QUALITATIVE: Hep B S Ab: NONREACTIVE

## 2018-11-16 MED ORDER — CLOPIDOGREL BISULFATE 75 MG PO TABS: 75.0000 mg | ORAL_TABLET | Freq: Every day | ORAL | 0 refills | Status: DC

## 2018-11-16 MED ORDER — DOCUSATE SODIUM 100 MG PO CAPS: 100.0000 mg | ORAL_CAPSULE | Freq: Two times a day (BID) | ORAL | 0 refills | Status: DC

## 2018-11-16 NOTE — Progress Notes (Signed)
HD Tx End  1.5kg removed, tolerated well. To receive BP meds in room, floor RN aware of both pain and hypertension.   11/16/18 1311  Hand-Off documentation  Report given to (Full Name) Misty RN 2C  Report received from (Full Name) Trellis Paganini RN  Vital Signs  Temp 98.4 F (36.9 C)  Temp Source Oral  Pulse Rate 100  Pulse Rate Source Monitor  Resp 16  BP (!) 168/98  BP Location Right Arm  BP Method Automatic  Patient Position (if appropriate) Lying  Oxygen Therapy  SpO2 100 %  O2 Device Nasal Cannula  O2 Flow Rate (L/min) 2 L/min  Pain Assessment  Pain Scale 0-10  Pain Score 8  Pain Type Chronic pain  Pain Location Back  Pain Descriptors / Indicators Aching  Pain Frequency Constant  Dialysis Weight  Weight 129.2 kg  Type of Weight Post-Dialysis  During Hemodialysis Assessment  Blood Flow Rate (mL/min) 200 mL/min  Arterial Pressure (mmHg) -90 mmHg  Venous Pressure (mmHg) 110 mmHg  Transmembrane Pressure (mmHg) 50 mmHg  Ultrafiltration Rate (mL/min) 710 mL/min  Dialysate Flow Rate (mL/min) 600 ml/min  Conductivity: Machine  14  HD Safety Checks Performed Yes  Dialysis Fluid Bolus Normal Saline  Bolus Amount (mL) 250 mL  Intra-Hemodialysis Comments Tx completed

## 2018-11-16 NOTE — Progress Notes (Signed)
Spoke with brother to request someone to pick patient up. Patient has been discharged. Per patients brother, he would be the one picking him up shortly.   [name], RN

## 2018-11-16 NOTE — Progress Notes (Signed)
HD Tx Start    11/16/18 0945  Vital Signs  Pulse Rate 89  Resp 18  BP (!) 164/90  Oxygen Therapy  SpO2 100 %  Dialysis Weight  Weight 130.7 kg  Type of Weight Pre-Dialysis  During Hemodialysis Assessment  Blood Flow Rate (mL/min) 400 mL/min  Arterial Pressure (mmHg) -180 mmHg  Venous Pressure (mmHg) 190 mmHg  Transmembrane Pressure (mmHg) 50 mmHg  Dialysate Flow Rate (mL/min) 600 ml/min  Conductivity: Machine  14  HD Safety Checks Performed Yes  Dialysis Fluid Bolus Normal Saline  Bolus Amount (mL) 250 mL  Intra-Hemodialysis Comments Tx initiated

## 2018-11-16 NOTE — Progress Notes (Signed)
[facility]  ROUNDING NOTE   Subjective:   Seen and examined on hemodialysis treatment. Tolerated treatment well.     HEMODIALYSIS FLOWSHEET:  Blood Flow Rate (mL/min): 400 mL/min Arterial Pressure (mmHg): -190 mmHg Venous Pressure (mmHg): 200 mmHg Transmembrane Pressure (mmHg): 60 mmHg Ultrafiltration Rate (mL/min): 710 mL/min Dialysate Flow Rate (mL/min): 600 ml/min Conductivity: Machine : 14 Conductivity: Machine : 14 Dialysis Fluid Bolus: Normal Saline Bolus Amount (mL): 250 mL    Objective:  Vital signs in last 24 hours:  Temp:  [97.5 F (36.4 C)-98.6 F (37 C)] 98 F (36.7 C) (05/08 0930) Pulse Rate:  [87-100] 90 (05/08 1230) Resp:  [16-21] 17 (05/08 1245) BP: (142-173)/(82-99) 152/98 (05/08 1245) SpO2:  [97 %-100 %] 100 % (05/08 1230) Weight:  [130.7 kg] 130.7 kg (05/08 0945)  Weight change:  Filed Weights   11/13/18 2330 11/14/18 1628 11/16/18 0945  Weight: 136.1 kg 129 kg 130.7 kg    Intake/Output: I/O last 3 completed shifts: In: 600 [P.O.:600] Out: 650 [Urine:650]   Intake/Output this shift:  Total I/O In: 520 [P.O.:520] Out: -   Physical Exam: General: NAD, laying in bed  Head: Normocephalic, atraumatic. Moist oral mucosal membranes  Eyes: Anicteric, PERRL  Neck: Supple, trachea midline  Lungs:  Clear to auscultation  Heart: Regular rate and rhythm  Abdomen:  Soft, nontender,   Extremities:  no  peripheral edema.  Neurologic: Nonfocal, moving all four extremities  Skin: No lesions  Access: Left AVF    Basic Metabolic Panel: Recent Labs  Lab 11/14/18 0002 11/15/18 0921 11/16/18 0147  NA 139 138 138  K 4.9 3.9 4.0  CL 104 100 101  CO2 24 27 26   GLUCOSE 89 114* 94  BUN 56* 40* 43*  CREATININE 10.38* 7.93* 8.09*  CALCIUM 7.7* 7.9* 8.1*  PHOS  --  4.7* 4.8*    Liver Function Tests: Recent Labs  Lab 11/14/18 0002 11/15/18 0921 11/16/18 0147  ALBUMIN 3.5 3.1* 3.0*   No results for input(s): LIPASE, AMYLASE in  the last 168 hours. No results for input(s): AMMONIA in the last 168 hours.  CBC: Recent Labs  Lab 11/14/18 0002 11/16/18 0147  WBC 6.2 3.9*  NEUTROABS 4.2  --   HGB 9.4* 8.4*  HCT 29.0* 25.5*  MCV 88.4 87.0  PLT 113* 97*    Cardiac Enzymes: No results for input(s): CKTOTAL, CKMB, CKMBINDEX, TROPONINI in the last 168 hours.  BNP: Invalid input(s): POCBNP  CBG: No results for input(s): GLUCAP in the last 168 hours.  Microbiology: Results for orders placed or performed during the hospital encounter of 11/13/18  SARS Coronavirus 2 (CEPHEID- Performed in [place] hospital lab), Hosp Order     Status: None   Collection Time: 11/14/18 12:02 AM  Result Value Ref Range Status   SARS Coronavirus 2 NEGATIVE NEGATIVE Final    Comment: (NOTE) If result is NEGATIVE SARS-CoV-2 target nucleic acids are NOT DETECTED. The SARS-CoV-2 RNA is generally detectable in upper and lower  respiratory specimens during the acute phase of infection. The lowest  concentration of SARS-CoV-2 viral copies this assay can detect is 250  copies / mL. A negative result does not preclude SARS-CoV-2 infection  and should not be used as the sole basis for treatment or other  patient management decisions.  A negative result may occur with  improper specimen collection / handling, submission of specimen other  than nasopharyngeal swab, presence of viral mutation(s) within the  areas targeted by this assay,  and inadequate number of viral copies  (<250 copies / mL). A negative result must be combined with clinical  observations, patient history, and epidemiological information. If result is POSITIVE SARS-CoV-2 target nucleic acids are DETECTED. The SARS-CoV-2 RNA is generally detectable in upper and lower  respiratory specimens dur ing the acute phase of infection.  Positive  results are indicative of active infection with SARS-CoV-2.  Clinical  correlation with patient history and other diagnostic  information is  necessary to determine patient infection status.  Positive results do  not rule out bacterial infection or co-infection with other viruses. If result is PRESUMPTIVE POSTIVE SARS-CoV-2 nucleic acids MAY BE PRESENT.   A presumptive positive result was obtained on the submitted specimen  and confirmed on repeat testing.  While 2019 novel coronavirus  (SARS-CoV-2) nucleic acids may be present in the submitted sample  additional confirmatory testing may be necessary for epidemiological  and / or clinical management purposes  to differentiate between  SARS-CoV-2 and other Sarbecovirus currently known to infect humans.  If clinically indicated additional testing with an alternate test  methodology [phone]) is advised. The SARS-CoV-2 RNA is generally  detectable in upper and lower respiratory sp ecimens during the acute  phase of infection. The expected result is Negative. Fact Sheet for Patients:  StrictlyIdeas.no Fact Sheet for Healthcare Providers: BankingDealers.co.za This test is not yet approved or cleared by the [place] FDA and has been authorized for detection and/or diagnosis of SARS-CoV-2 by FDA under an Emergency Use Authorization (EUA).  This EUA will remain in effect (meaning this test can be used) for the duration of the COVID-19 declaration under Section 564(b)(1) of the Act, 21 U.S.C. section 360bbb-3(b)(1), unless the authorization is terminated or revoked sooner. Performed at [facility], [address] [place], [place] [address]   MRSA PCR Screening     Status: None   Collection Time: 11/14/18  4:48 AM  Result Value Ref Range Status   MRSA by PCR NEGATIVE NEGATIVE Final    Comment:        The GeneXpert MRSA Assay (FDA approved for NASAL specimens only), is one component of a comprehensive MRSA colonization surveillance program. It is not intended to diagnose MRSA infection nor to guide  or monitor treatment for MRSA infections. Performed at [facility], [address] [place],  [address]     Coagulation Studies: No results for input(s): LABPROT, INR in the last 72 hours.  Urinalysis: No results for input(s): COLORURINE, LABSPEC, PHURINE, GLUCOSEU, HGBUR, BILIRUBINUR, KETONESUR, PROTEINUR, UROBILINOGEN, NITRITE, LEUKOCYTESUR in the last 72 hours.  Invalid input(s): APPERANCEUR    Imaging: No results found.   Medications:    . amLODipine  10 mg Oral Daily  . aspirin EC  81 mg Oral Daily  . clopidogrel  75 mg Oral Daily  . docusate sodium  100 mg Oral BID  . epoetin (EPOGEN/PROCRIT) injection  10,000 Units Intravenous Q M,W,F-HD  . heparin  5,000 Units Subcutaneous Q8H  . hydrALAZINE  100 mg Oral TID  . isosorbide mononitrate  60 mg Oral Daily  . mouth rinse  15 mL Mouth Rinse BID  . nicotine  14 mg Transdermal Daily  . oxyCODONE  20 mg Oral Q12H  . senna-docusate  1 tablet Oral BID  . sevelamer carbonate  2,400 mg Oral TID WC  . terazosin  5 mg Oral QHS   acetaminophen, albuterol, ondansetron **OR** ondansetron (ZOFRAN) IV, oxyCODONE, sodium chloride, traZODone  Assessment/ Plan:  Mr.  [name] is a 63 y.o. black male with end stage renal disease on hemodialysis, depression, hypertension, pulmonary embolism, substance abuse.   1. End stage renal disease: reports missing 2 weeks of hemodialysis.  Seen and examined on hemodialysis treatment.  - Outpatient planning for [place] [facility] - to start tomorrow.   2.  Anemia chronic kidney disease.    - EPO with HD treatment  3.  Secondary hyperparathyroidism   - Sevelamer with meals.   4. Hypertension: elevated.  Home regimen of tamsulosin and labetalol.    LOS: 1 Zylen Wenig 5/8/202012:49 PM

## 2018-11-16 NOTE — Progress Notes (Signed)
Post HD Tx 1.5kg removed, tolerated well. To receive BP meds in room, floor RN aware of both pain and hypertension.   11/16/18 1314  Vital Signs  Pulse Rate 98  Resp 18  BP (!) 164/100  Oxygen Therapy  SpO2 100 %  Post-Hemodialysis Assessment  Rinseback Volume (mL) 250 mL  Dialyzer Clearance Lightly streaked  Duration of HD Treatment -hour(s) 3.5 hour(s)  Hemodialysis Intake (mL) 500 mL  UF Total -Machine (mL) 2000 mL  Net UF (mL) 1500 mL  Tolerated HD Treatment Yes  AVG/AVF Arterial Site Held (minutes) 12 minutes  AVG/AVF Venous Site Held (minutes) 10 minutes  Fistula / Graft Left Upper arm Arteriovenous fistula  No Placement Date or Time found.   Placed prior to admission: Yes  Orientation: Left  Access Location: Upper arm  Access Type: Arteriovenous fistula  Site Condition No complications  Fistula / Graft Assessment Present;Thrill;Bruit  Status Deaccessed  Drainage Description None

## 2018-11-16 NOTE — Progress Notes (Signed)
Patient accepted at [facility], chair time TTS 6:15. Can start tomorrow 5/9. Patient is aware.

## 2018-11-16 NOTE — Progress Notes (Signed)
Pre HD Assessment   11/16/18 0930  Neurological  Level of Consciousness Alert  Orientation Level Oriented X4  Respiratory  Respiratory Pattern Regular  Chest Assessment Chest expansion symmetrical  Bilateral Breath Sounds Expiratory wheezes;Diminished  Cardiac  ECG Monitor Yes  Cardiac Rhythm ST  Heart Block Type 1st degree AVB  Ectopy Multifocal PVC's  Ectopy Frequency Rare  Vascular  R Radial Pulse +2  L Radial Pulse +2  Edema Generalized;Right lower extremity  Integumentary  Integumentary (WDL) X  Skin Color Appropriate for ethnicity  Skin Condition Dry  Skin Integrity Weeping  Weeping Location Leg  Weeping Location Orientation Right;Lower  Additional Integumentary Comments  (HD pt )  Musculoskeletal  Musculoskeletal (WDL) X  Generalized Weakness Yes  Gastrointestinal  Bowel Sounds Assessment Active  GU Assessment  Genitourinary (WDL) X  Genitourinary Symptoms Oliguria (HD pt)  Psychosocial  Psychosocial (WDL) WDL  Emotional support given Given to patient

## 2018-11-16 NOTE — Discharge Summary (Signed)
[facility] [place] NAME: [name]    MR#:  [id number]  DATE OF BIRTH:  1956-04-11  DATE OF ADMISSION:  11/13/2018 ADMITTING PHYSICIAN: Harrie Foreman, MD  DATE OF DISCHARGE: 11/16/2018   PRIMARY CARE PHYSICIAN: Patient, No Pcp Per    ADMISSION DIAGNOSIS:  ESRD (end stage renal disease) on dialysis ([place]) [N18.6, Z99.2] Other hypervolemia [E87.79]  DISCHARGE DIAGNOSIS:  Principal Problem:   ESRD (end stage renal disease) on dialysis [facility]) Active Problems:   Antisocial personality disorder ([place])   Noncompliance   Fluid overload   SECONDARY DIAGNOSIS:   Past Medical History:  Diagnosis Date  . Depression   . Heart attack ([place])   . Hypertension   . PE (pulmonary embolism)   . Renal disorder     HOSPITAL COURSE:   This is a 63 year old male admitted for fluid overload due to renal failure. 1. End-stage renal disease: On dialysis: Fluid overload secondary to missed dialysis for 2 weeks. Renvela with meals. Consult nephrology for continuation of dialysis, case manager to help arranging hemodialysis. His outpatient hemodialysis is arranged and he has chair time starting tomorrow.  2. Hypertension: Uncontrolled; continue hydralazine and amlodipine. Blood pressure is stable. 3. CAD: Stable; continue aspirin, Eliquis and Imdur 4. Respiratory failure: Chronic; with hypoxia. Continue supplemental oxygen per home regimen-patient was able to be stable on room air so does not need new arrangement of home oxygen on the discharge from here. 5. BPH: Continue terazosin 6. DVT prophylaxis: Heparin. 7. GI prophylaxis: None 8.  History of pulmonary embolism-continue Eliquis. DISCHARGE CONDITIONS:   Stable  CONSULTS OBTAINED:  Treatment Team:  Lavonia Dana, MD  DRUG ALLERGIES:   Allergies  Allergen Reactions  . Cyclobenzaprine Nausea Only  . Clonidine Other (See Comments)    Asymptomatic bradycardia to 38   . Furosemide Other (See Comments)    Caused renal failure  . Tylenol [Acetaminophen] Other (See Comments)    Reaction:  Pt states it bothers his liver    DISCHARGE MEDICATIONS:   Allergies as of 11/16/2018      Reactions   Cyclobenzaprine Nausea Only   Clonidine Other (See Comments)   Asymptomatic bradycardia to 38   Furosemide Other (See Comments)   Caused renal failure   Tylenol [acetaminophen] Other (See Comments)   Reaction:  Pt states it bothers his liver      Medication List    STOP taking these medications   cefdinir 300 MG capsule Commonly known as:  OMNICEF   clopidogrel 75 MG tablet Commonly known as:  PLAVIX   labetalol 300 MG tablet Commonly known as:  NORMODYNE   predniSONE 50 MG tablet Commonly known as:  DELTASONE     TAKE these medications   albuterol 108 (90 Base) MCG/ACT inhaler Commonly known as:  VENTOLIN HFA Inhale 2 puffs into the lungs every 6 (six) hours as needed for wheezing or shortness of breath.   amLODipine 10 MG tablet Commonly known as:  NORVASC Take 10 mg by mouth daily.   aspirin EC 81 MG tablet Take 81 mg by mouth daily.   atorvastatin 40 MG tablet Commonly known as:  LIPITOR Take 40 mg by mouth at bedtime.   benzonatate 100 MG capsule Commonly known as:  TESSALON Take 1 capsule (100 mg total) by mouth 3 (three) times daily as needed for cough.   docusate sodium 100 MG capsule Commonly known as:  COLACE Take 1 capsule (100 mg total)  by mouth 2 (two) times daily.   Eliquis 2.5 MG Tabs tablet Generic drug:  apixaban Take 2.5 mg by mouth 2 (two) times daily.   hydrALAZINE 100 MG tablet Commonly known as:  APRESOLINE Take 1 tablet (100 mg total) by mouth 3 (three) times daily.   isosorbide mononitrate 60 MG 24 hr tablet Commonly known as:  IMDUR Take 1 tablet (60 mg total) by mouth daily.   nicotine 14 mg/24hr patch Commonly known as:  NICODERM CQ - dosed in mg/24 hours Place 1 patch (14 mg total) onto the skin  daily.   Oxycodone HCl 10 MG Tabs Take 10-20 mg by mouth every 4 (four) hours as needed (for pain). What changed:  Another medication with the same name was removed. Continue taking this medication, and follow the directions you see here.   polyethylene glycol 17 g packet Commonly known as:  MIRALAX / GLYCOLAX Take 17 g by mouth daily as needed for mild constipation.   senna-docusate 8.6-50 MG tablet Commonly known as:  Senokot-S Take 1 tablet by mouth 2 (two) times daily.   sevelamer carbonate 800 MG tablet Commonly known as:  RENVELA Take 2 tablets (1,600 mg total) by mouth 3 (three) times daily with meals.   sucralfate 1 g tablet Commonly known as:  Carafate Take 1 tablet (1 g total) by mouth 4 (four) times daily.   tamsulosin 0.4 MG Caps capsule Commonly known as:  FLOMAX Take 1 capsule (0.4 mg total) by mouth daily.   terazosin 5 MG capsule Commonly known as:  HYTRIN Take 1 capsule (5 mg total) by mouth at bedtime.   traZODone 100 MG tablet Commonly known as:  DESYREL Take 100 mg by mouth at bedtime as needed for sleep.        DISCHARGE INSTRUCTIONS:    Follow with nephrology clinic in 1 to 2 weeks.  Continue outpatient hemodialysis.  If you experience worsening of your admission symptoms, develop shortness of breath, life threatening emergency, suicidal or homicidal thoughts you must seek medical attention immediately by calling 911 or calling your MD immediately  if symptoms less severe.  You Must read complete instructions/literature along with all the possible adverse reactions/side effects for all the Medicines you take and that have been prescribed to you. Take any new Medicines after you have completely understood and accept all the possible adverse reactions/side effects.   Please note  You were cared for by a hospitalist during your hospital stay. If you have any questions about your discharge medications or the care you received while you were in the  hospital after you are discharged, you can call the unit and asked to speak with the hospitalist on call if the hospitalist that took care of you is not available. Once you are discharged, your primary care physician will handle any further medical issues. Please note that NO REFILLS for any discharge medications will be authorized once you are discharged, as it is imperative that you return to your primary care physician (or establish a relationship with a primary care physician if you do not have one) for your aftercare needs so that they can reassess your need for medications and monitor your lab values.    Today   CHIEF COMPLAINT:   Chief Complaint  Patient presents with  . Shortness of Breath    HISTORY OF PRESENT ILLNESS:  [name]  is a 63 y.o. male with a known history of end-stage renal disease on dialysis, hypertension and CAD presents  to the emergency department complaining of shortness of breath.  The patient states that he feels as if he has too much fluid on him.  He acknowledges that he has missed dialysis for nearly 2 weeks because he "was not feeling well".  He denies cough.  Also denies travel risk factors or known contact with individuals with exposure to novel coronavirus.  Upon arrival the patient was on 3 L of oxygen via nasal cannula which is baseline for him.  The patient's lower extremities were grossly edematous as well.  He does not know his dry weight.  Laboratory evaluation significant for elevated creatinine and BNP.  Due to symptomatic fluid overload emergency department staff call hospitalist service for admission.   VITAL SIGNS:  Blood pressure (!) 164/100, pulse 96, temperature 98.4 F (36.9 C), temperature source Oral, resp. rate 14, height 6' (1.829 m), weight 129.2 kg, SpO2 100 %.  I/O:    Intake/Output Summary (Last 24 hours) at 11/16/2018 1442 Last data filed at 11/16/2018 1314 Gross per 24 hour  Intake 520 ml  Output 2150 ml  Net -1630 ml     PHYSICAL EXAMINATION:   GENERAL:  63 y.o.-year-old patient lying in the bed with no acute distress.  EYES: Pupils equal, round, reactive to light and accommodation. No scleral icterus. Extraocular muscles intact.  HEENT: Head atraumatic, normocephalic. Oropharynx and nasopharynx clear.  NECK:  Supple, no jugular venous distention. No thyroid enlargement, no tenderness.  LUNGS: Normal breath sounds bilaterally, no wheezing, have b/l crepitation. No use of accessory muscles of respiration.  CARDIOVASCULAR: S1, S2 normal. No murmurs, rubs, or gallops.  ABDOMEN: Soft, nontender, nondistended. Bowel sounds present. No organomegaly or mass.  EXTREMITIES: positive for pedal edema, no cyanosis, or clubbing.  NEUROLOGIC: Cranial nerves II through XII are intact. Muscle strength 4/5 in all extremities. Sensation intact. Gait not checked.  PSYCHIATRIC: The patient is alert and oriented x 3.  SKIN: No obvious rash, lesion, or ulcer.    DATA REVIEW:   CBC Recent Labs  Lab 11/16/18 0147  WBC 3.9*  HGB 8.4*  HCT 25.5*  PLT 97*    Chemistries  Recent Labs  Lab 11/16/18 0147  NA 138  K 4.0  CL 101  CO2 26  GLUCOSE 94  BUN 43*  CREATININE 8.09*  CALCIUM 8.1*    Cardiac Enzymes No results for input(s): TROPONINI in the last 168 hours.  Microbiology Results  Results for orders placed or performed during the hospital encounter of 11/13/18  SARS Coronavirus 2 (CEPHEID- Performed in [facility] hospital lab), Hosp Order     Status: None   Collection Time: 11/14/18 12:02 AM  Result Value Ref Range Status   SARS Coronavirus 2 NEGATIVE NEGATIVE Final    Comment: (NOTE) If result is NEGATIVE SARS-CoV-2 target nucleic acids are NOT DETECTED. The SARS-CoV-2 RNA is generally detectable in upper and lower  respiratory specimens during the acute phase of infection. The lowest  concentration of SARS-CoV-2 viral copies this assay can detect is 250  copies / mL. A negative result does not  preclude SARS-CoV-2 infection  and should not be used as the sole basis for treatment or other  patient management decisions.  A negative result may occur with  improper specimen collection / handling, submission of specimen other  than nasopharyngeal swab, presence of viral mutation(s) within the  areas targeted by this assay, and inadequate number of viral copies  (<250 copies / mL). A negative result must be combined with  clinical  observations, patient history, and epidemiological information. If result is POSITIVE SARS-CoV-2 target nucleic acids are DETECTED. The SARS-CoV-2 RNA is generally detectable in upper and lower  respiratory specimens dur ing the acute phase of infection.  Positive  results are indicative of active infection with SARS-CoV-2.  Clinical  correlation with patient history and other diagnostic information is  necessary to determine patient infection status.  Positive results do  not rule out bacterial infection or co-infection with other viruses. If result is PRESUMPTIVE POSTIVE SARS-CoV-2 nucleic acids MAY BE PRESENT.   A presumptive positive result was obtained on the submitted specimen  and confirmed on repeat testing.  While 2019 novel coronavirus  (SARS-CoV-2) nucleic acids may be present in the submitted sample  additional confirmatory testing may be necessary for epidemiological  and / or clinical management purposes  to differentiate between  SARS-CoV-2 and other Sarbecovirus currently known to infect humans.  If clinically indicated additional testing with an alternate test  methodology [phone]) is advised. The SARS-CoV-2 RNA is generally  detectable in upper and lower respiratory sp ecimens during the acute  phase of infection. The expected result is Negative. Fact Sheet for Patients:  StrictlyIdeas.no Fact Sheet for Healthcare Providers: BankingDealers.co.za This test is not yet approved or cleared by  the [place] FDA and has been authorized for detection and/or diagnosis of SARS-CoV-2 by FDA under an Emergency Use Authorization (EUA).  This EUA will remain in effect (meaning this test can be used) for the duration of the COVID-19 declaration under Section 564(b)(1) of the Act, 21 U.S.C. section 360bbb-3(b)(1), unless the authorization is terminated or revoked sooner. Performed at [facility], [address] [place], [place] [address]   MRSA PCR Screening     Status: None   Collection Time: 11/14/18  4:48 AM  Result Value Ref Range Status   MRSA by PCR NEGATIVE NEGATIVE Final    Comment:        The GeneXpert MRSA Assay (FDA approved for NASAL specimens only), is one component of a comprehensive MRSA colonization surveillance program. It is not intended to diagnose MRSA infection nor to guide or monitor treatment for MRSA infections. Performed at [facility], [address], [place], [place] [address]     RADIOLOGY:  No results found.  EKG:   Orders placed or performed during the hospital encounter of 11/13/18  . EKG 12-Lead  . EKG 12-Lead  . ED EKG  . ED EKG      Management plans discussed with the patient, family and they are in agreement.  CODE STATUS:     Code Status Orders  (From admission, onward)         Start     Ordered   11/14/18 0356  Full code  Continuous     11/14/18 0355        Code Status History    Date Active Date Inactive Code Status Order ID Comments User Context   08/27/2018 1828 08/30/2018 1856 Full Code [id number]  Gorden Harms, MD Inpatient   08/25/2018 0802 08/26/2018 1333 Full Code [id number]  Sela Hua, MD ED   08/20/2018 0400 08/21/2018 1823 Full Code [id number]  Harrie Foreman, MD Inpatient   08/15/2018 0058 08/18/2018 1624 Full Code [id number]  Lance Coon, MD Inpatient   09/21/2016 1519 09/27/2016 2042 Full Code [id number]  Hillary Bow, MD ED   04/28/2016 2325 04/29/2016 2043 Full Code  [id number]  Muthersbaugh, Gwenlyn Perking ED   01/18/2016  0202 01/18/2016 1848 Full Code [id number]  Quintella Baton, MD Inpatient      TOTAL TIME TAKING CARE OF THIS PATIENT: 35 minutes.    Vaughan Basta M.D on 11/16/2018 at 2:42 PM  Between 7am to 6pm - Pager - [phone]  After 6pm go to [email or web address] - password [email or web address] [facility]  Office  [phone]  CC: Primary care physician; Patient, No Pcp Per   Note: This dictation was prepared with Dragon dictation along with smaller phrase technology. Any transcriptional errors that result from this process are unintentional.

## 2018-11-16 NOTE — Progress Notes (Signed)
Post HD Assessment 1.5kg removed, tolerated well. To receive BP meds in room, floor RN aware of both pain and hypertension.    11/16/18 1349  Neurological  Level of Consciousness Alert  Orientation Level Oriented X4  Respiratory  Respiratory Pattern Regular  Chest Assessment Chest expansion symmetrical  Bilateral Breath Sounds Diminished;Expiratory wheezes  Cardiac  Pulse Irregular (hx / reported afib. monitor shows BBB)  ECG Monitor Yes  Cardiac Rhythm ST  Heart Block Type 1st degree AVB  Ectopy Multifocal PVC's  Ectopy Frequency Rare  Vascular  R Radial Pulse +2  L Radial Pulse +2  Edema Generalized;Right lower extremity  Integumentary  Integumentary (WDL) X  Skin Color Appropriate for ethnicity  Skin Condition Dry  Skin Integrity Weeping  Weeping Location Leg  Weeping Location Orientation Right;Lower  Musculoskeletal  Musculoskeletal (WDL) X  Generalized Weakness Yes  Gastrointestinal  Bowel Sounds Assessment Active  GU Assessment  Genitourinary (WDL) X  Genitourinary Symptoms Oliguria (HD pt)  Psychosocial  Psychosocial (WDL) WDL  Emotional support given Given to patient

## 2018-11-16 NOTE — Progress Notes (Signed)
SATURATION QUALIFICATIONS: (This note is used to comply with regulatory documentation for home oxygen)  Patient Saturations on Room Air at Rest = 100%  Patient Saturations on Room Air while Ambulating = 97%  Patient Saturations on  0 Liters of oxygen while Ambulating = No Oxygen needed  Please briefly explain why patient needs home oxygen: Qualify home oxygen need  [name], RN

## 2018-11-16 NOTE — Progress Notes (Signed)
[name] to be D/C'd Home per MD order.  Discussed prescriptions and follow up appointments with the patient. Prescriptions given to patient, medication list explained in detail. Pt verbalized understanding.  Allergies as of 11/16/2018      Reactions   Cyclobenzaprine Nausea Only   Clonidine Other (See Comments)   Asymptomatic bradycardia to 38   Furosemide Other (See Comments)   Caused renal failure   Tylenol [acetaminophen] Other (See Comments)   Reaction:  Pt states it bothers his liver      Medication List    STOP taking these medications   cefdinir 300 MG capsule Commonly known as:  OMNICEF   clopidogrel 75 MG tablet Commonly known as:  PLAVIX   labetalol 300 MG tablet Commonly known as:  NORMODYNE   predniSONE 50 MG tablet Commonly known as:  DELTASONE     TAKE these medications   albuterol 108 (90 Base) MCG/ACT inhaler Commonly known as:  VENTOLIN HFA Inhale 2 puffs into the lungs every 6 (six) hours as needed for wheezing or shortness of breath.   amLODipine 10 MG tablet Commonly known as:  NORVASC Take 10 mg by mouth daily.   aspirin EC 81 MG tablet Take 81 mg by mouth daily.   atorvastatin 40 MG tablet Commonly known as:  LIPITOR Take 40 mg by mouth at bedtime.   benzonatate 100 MG capsule Commonly known as:  TESSALON Take 1 capsule (100 mg total) by mouth 3 (three) times daily as needed for cough.   docusate sodium 100 MG capsule Commonly known as:  COLACE Take 1 capsule (100 mg total) by mouth 2 (two) times daily.   Eliquis 2.5 MG Tabs tablet Generic drug:  apixaban Take 2.5 mg by mouth 2 (two) times daily.   hydrALAZINE 100 MG tablet Commonly known as:  APRESOLINE Take 1 tablet (100 mg total) by mouth 3 (three) times daily.   isosorbide mononitrate 60 MG 24 hr tablet Commonly known as:  IMDUR Take 1 tablet (60 mg total) by mouth daily.   nicotine 14 mg/24hr patch Commonly known as:  NICODERM CQ - dosed in mg/24 hours Place 1 patch  (14 mg total) onto the skin daily.   Oxycodone HCl 10 MG Tabs Take 10-20 mg by mouth every 4 (four) hours as needed (for pain). What changed:  Another medication with the same name was removed. Continue taking this medication, and follow the directions you see here.   polyethylene glycol 17 g packet Commonly known as:  MIRALAX / GLYCOLAX Take 17 g by mouth daily as needed for mild constipation.   senna-docusate 8.6-50 MG tablet Commonly known as:  Senokot-S Take 1 tablet by mouth 2 (two) times daily.   sevelamer carbonate 800 MG tablet Commonly known as:  RENVELA Take 2 tablets (1,600 mg total) by mouth 3 (three) times daily with meals.   sucralfate 1 g tablet Commonly known as:  Carafate Take 1 tablet (1 g total) by mouth 4 (four) times daily.   tamsulosin 0.4 MG Caps capsule Commonly known as:  FLOMAX Take 1 capsule (0.4 mg total) by mouth daily.   terazosin 5 MG capsule Commonly known as:  HYTRIN Take 1 capsule (5 mg total) by mouth at bedtime.   traZODone 100 MG tablet Commonly known as:  DESYREL Take 100 mg by mouth at bedtime as needed for sleep.       Vitals:   11/16/18 1315 11/16/18 1407  BP:    Pulse:  96  Resp: 14   Temp:    SpO2:  100%    Skin clean, dry and intact without evidence of skin break down, no evidence of skin tears noted. IV catheter discontinued intact. Site without signs and symptoms of complications. Dressing and pressure applied. Pt denies pain at this time. No complaints noted.  An After Visit Summary was printed and given to the patient. Patient escorted via [place], and D/C home via private auto.  [name], RN

## 2018-11-16 NOTE — TOC Transition Note (Signed)
Transition of Care [facility]) - CM/SW Discharge Note   Patient Details  Name: SAYYID HAREWOOD MRN: [id number] Date of Birth: 05/26/1956  [facility] of [facility] CM/SW Contact:  Beverly Sessions, RN Phone Number: 11/16/2018, 4:05 PM   Clinical Narrative:      Patient to discharge home today. Patient maintained saturations on RA with exertion and will not require O2 at time of discharge. Outpatient HD schedule is as follows [facility], chair time TTS 6:15. Can start tomorrow 5/9.  Dialysis coordinator has notified family and patient.    Patient has a new patient appointment at [facility] on 5/14.  Patient declines any home health services at discharge stating he "doesn't feel like it is needed"   Barriers to Discharge: Continued Medical Work up   Patient Goals and CMS Choice        Discharge Placement                       Discharge Plan and Services                                     Social Determinants of Health (SDOH) Interventions     Readmission Risk Interventions Readmission Risk Prevention Plan 11/16/2018 11/14/2018  Transportation Screening Complete Complete  Medication Review Press photographer) Complete Complete  PCP or Specialist appointment within 3-5 days of discharge Complete -  [place] or Home Care Consult Patient refused -  Palliative Care Screening Not Applicable -  [place] Not Applicable Not Applicable  Some recent data might be hidden

## 2018-11-16 NOTE — Progress Notes (Signed)
Pre HD Tx    11/16/18 0930  Hand-Off documentation  Report given to (Full Name) Trellis Paganini RN  Report received from (Full Name) Ruthy Dick RN  Vital Signs  Temp 98 F (36.7 C)  Temp Source Oral  Pulse Rate 98  Pulse Rate Source Monitor  Resp 18  BP (!) 158/92  BP Location Right Arm  BP Method Automatic  Patient Position (if appropriate) Lying  Oxygen Therapy  SpO2 100 %  O2 Device Nasal Cannula  O2 Flow Rate (L/min) 2 L/min (sent down to HD with 2L per floor RN)  Pain Assessment  Pain Scale 0-10  Pain Score 3  Pain Type Chronic pain  Pain Location Back  Pain Descriptors / Indicators Aching  Pain Frequency Constant  Pain Onset On-going  Pain Intervention(s) Repositioned  Time-Out for Hemodialysis  What Procedure? Hemodialysis   Pt Identifiers(min of two) First/Last Name;MRN/Account#  Correct Site? Yes  Correct Side? Yes  Correct Procedure? Yes  Consents Verified? Yes  Rad Studies Available? N/A  Safety Precautions Reviewed? Yes  Engineer, civil (consulting) Number 5  Station Number 1  UF/Alarm Test Passed  Conductivity: Meter 14  Conductivity: Machine  13.8  pH 7.2  Reverse Osmosis Main  Normal Saline Lot Number [id number]  Dialyzer Lot Number [id number]  Disposable Set Lot Number [id number]  Machine Temperature 98.6 F (37 C)  Musician and Audible Yes  Blood Lines Intact and Secured Yes  Pre Treatment Patient Checks  Vascular access used during treatment Fistula  Hepatitis B Surface Antigen Results Negative  Date Hepatitis B Surface Antigen Drawn 11/16/18  Hepatitis B Surface Antibody  (<10)  Date Hepatitis B Surface Antibody Drawn 11/16/18  Hemodialysis Consent Verified Yes  Hemodialysis Standing Orders Initiated Yes  ECG (Telemetry) Monitor On Yes  Prime Ordered Normal Saline  Length of  DialysisTreatment -hour(s) 3.5 Hour(s)  Dialysis Treatment Comments Na 140  Dialyzer Elisio 17H NR  Dialysate 2K, 2.5 Ca  Dialysate Flow Ordered 600  Blood Flow  Rate Ordered 400 mL/min  Ultrafiltration Goal 1 Liters  Pre Treatment Labs Renal panel;CBC  Dialysis Blood Pressure Support Ordered Normal Saline  Education / Care Plan  Dialysis Education Provided Yes  Documented Education in Care Plan Yes  Fistula / Graft Left Upper arm Arteriovenous fistula  No Placement Date or Time found.   Placed prior to admission: Yes  Orientation: Left  Access Location: Upper arm  Access Type: Arteriovenous fistula  Site Condition No complications  Fistula / Graft Assessment Present;Thrill;Bruit  Status Accessed  Needle Size 15  Drainage Description None

## 2019-06-13 ENCOUNTER — Emergency Department
Admission: EM | Admit: 2019-06-13 | Discharge: 2019-06-13 | Disposition: A | Discharge status: Home / Self Care | Payer: Medicare Other | Attending: Emergency Medicine | Admitting: Emergency Medicine

## 2019-06-13 ENCOUNTER — Other Ambulatory Visit: Payer: Self-pay

## 2019-06-13 ENCOUNTER — Encounter: Payer: Self-pay | Admitting: Emergency Medicine

## 2019-06-13 DIAGNOSIS — I252 Old myocardial infarction: Secondary | ICD-10-CM | POA: Insufficient documentation

## 2019-06-13 DIAGNOSIS — N186 End stage renal disease: Secondary | ICD-10-CM | POA: Insufficient documentation

## 2019-06-13 DIAGNOSIS — Z992 Dependence on renal dialysis: Secondary | ICD-10-CM | POA: Insufficient documentation

## 2019-06-13 DIAGNOSIS — Z7982 Long term (current) use of aspirin: Secondary | ICD-10-CM | POA: Diagnosis not present

## 2019-06-13 DIAGNOSIS — F17210 Nicotine dependence, cigarettes, uncomplicated: Secondary | ICD-10-CM | POA: Diagnosis not present

## 2019-06-13 DIAGNOSIS — R531 Weakness: Secondary | ICD-10-CM | POA: Diagnosis present

## 2019-06-13 DIAGNOSIS — Z79899 Other long term (current) drug therapy: Secondary | ICD-10-CM | POA: Insufficient documentation

## 2019-06-13 DIAGNOSIS — Z7901 Long term (current) use of anticoagulants: Secondary | ICD-10-CM | POA: Diagnosis not present

## 2019-06-13 DIAGNOSIS — I120 Hypertensive chronic kidney disease with stage 5 chronic kidney disease or end stage renal disease: Secondary | ICD-10-CM | POA: Insufficient documentation

## 2019-06-13 LAB — BASIC METABOLIC PANEL
Anion gap: 14 (ref 5–15)
BUN: 34 mg/dL — ABNORMAL HIGH (ref 8–23)
CO2: 27 mmol/L (ref 22–32)
Calcium: 9.5 mg/dL (ref 8.9–10.3)
Chloride: 97 mmol/L — ABNORMAL LOW (ref 98–111)
Creatinine, Ser: 9.21 mg/dL — ABNORMAL HIGH (ref 0.61–1.24)
GFR calc Af Amer: 6 mL/min — ABNORMAL LOW (ref >60)
GFR calc non Af Amer: 6 mL/min — ABNORMAL LOW (ref >60)
Glucose, Bld: 104 mg/dL — ABNORMAL HIGH (ref 70–99)
Potassium: 3.6 mmol/L (ref 3.5–5.1)
Sodium: 138 mmol/L (ref 135–145)

## 2019-06-13 LAB — CBC
HCT: 35.7 % — ABNORMAL LOW (ref 39.0–52.0)
Hemoglobin: 11.9 g/dL — ABNORMAL LOW (ref 13.0–17.0)
MCH: 29.4 pg (ref 26.0–34.0)
MCHC: 33.3 g/dL (ref 30.0–36.0)
MCV: 88.1 fL (ref 80.0–100.0)
Platelets: 156 10*3/uL (ref 150–400)
RBC: 4.05 MIL/uL — ABNORMAL LOW (ref 4.22–5.81)
RDW: 16 % — ABNORMAL HIGH (ref 11.5–15.5)
WBC: 8.1 10*3/uL (ref 4.0–10.5)
nRBC: 0 % (ref 0.0–0.2)

## 2019-06-13 MED ORDER — TRAMADOL HCL 50 MG PO TABS
50.0000 mg | ORAL_TABLET | Freq: Once | ORAL | Status: AC
  Administered 2019-06-13: 19:00:00 50 mg via ORAL
  Filled 2019-06-13: qty 1

## 2019-06-13 NOTE — ED Provider Notes (Signed)
[facility] Emergency Department Provider Note   ____________________________________________    I have reviewed the triage vital signs and the nursing notes.   HISTORY  Chief Complaint Weakness     HPI [name] is a 63 y.o. male with history of hypertension, end-stage renal disease who presents to the emergency department stating that he needs dialysis.  He reports he feels well, no shortness of breath, no palpitations or chest pain or discomfort.  He reports he lives in [place] but over the last 2 weeks has been in [place] visiting family and he states he has decided that he is going to stay here now so he came to get dialysis.  He is not explored outpatient dialysis.  Past Medical History:  Diagnosis Date  . Depression   . Heart attack ([place])   . Hypertension   . PE (pulmonary embolism)   . Renal disorder     Patient Active Problem List   Diagnosis Date Noted  . Fluid overload 11/15/2018  . ESRD (end stage renal disease) on dialysis ([place]) 11/14/2018  . HCAP (healthcare-associated pneumonia) 08/27/2018  . Shortness of breath 08/25/2018  . Chest pain 08/20/2018  . ESRD on dialysis ([place]) 11/08/2016  . Prostate cancer screening 11/08/2016  . Acquired cyst of kidney 11/08/2016  . Urinary urgency 11/08/2016  . Acute on chronic renal failure ([place]) 09/23/2016  . Hypertensive urgency 09/21/2016  . Dysthymia 02/26/2016  . Chronic pain 02/26/2016  . Noncompliance 02/26/2016  . Opiate abuse, continuous ([place]) 02/03/2016  . Substance induced mood disorder ([place]) 02/03/2016  . Antisocial personality disorder ([place]) 02/03/2016  . Left-sided weakness 01/18/2016  . HEPATITIS C 06/28/2008  . HLD (hyperlipidemia) 06/28/2008  . SUBSTANCE ABUSE, MULTIPLE 06/28/2008  . Hypertension 06/27/2008    Past Surgical History:  Procedure Laterality Date  . TOTAL HIP ARTHROPLASTY      Prior to Admission medications   Medication Sig Start Date  End Date Taking? Authorizing Provider  albuterol (PROVENTIL HFA;VENTOLIN HFA) 108 (90 Base) MCG/ACT inhaler Inhale 2 puffs into the lungs every 6 (six) hours as needed for wheezing or shortness of breath. 08/30/18   Salary, Holly Bodily D, MD  amLODipine (NORVASC) 10 MG tablet Take 10 mg by mouth daily. 11/27/15   [provider]  apixaban (ELIQUIS) 2.5 MG TABS tablet Take 2.5 mg by mouth 2 (two) times daily.    [provider]  aspirin EC 81 MG tablet Take 81 mg by mouth daily.    [provider]  atorvastatin (LIPITOR) 40 MG tablet Take 40 mg by mouth at bedtime.  11/27/15   [provider]  benzonatate (TESSALON) 100 MG capsule Take 1 capsule (100 mg total) by mouth 3 (three) times daily as needed for cough. 08/30/18   Salary, Avel Peace, MD  docusate sodium (COLACE) 100 MG capsule Take 1 capsule (100 mg total) by mouth 2 (two) times daily. 11/16/18   Vaughan Basta, MD  hydrALAZINE (APRESOLINE) 100 MG tablet Take 1 tablet (100 mg total) by mouth 3 (three) times daily. 09/27/16   Gladstone Lighter, MD  isosorbide mononitrate (IMDUR) 60 MG 24 hr tablet Take 1 tablet (60 mg total) by mouth daily. 09/28/16   Gladstone Lighter, MD  nicotine (NICODERM CQ - DOSED IN MG/24 HOURS) 14 mg/24hr patch Place 1 patch (14 mg total) onto the skin daily. Patient not taking: Reported on 11/14/2018 08/31/18   Salary, Holly Bodily D, MD  Oxycodone HCl 10 MG TABS Take 10-20 mg  by mouth every 4 (four) hours as needed (for pain).  04/11/16   [provider]  polyethylene glycol (MIRALAX / GLYCOLAX) packet Take 17 g by mouth daily as needed for mild constipation.  11/27/15   [provider]  senna-docusate (SENOKOT-S) 8.6-50 MG tablet Take 1 tablet by mouth 2 (two) times daily. 09/27/16   Gladstone Lighter, MD  sevelamer carbonate (RENVELA) 800 MG tablet Take 2 tablets (1,600 mg total) by mouth 3 (three) times daily with meals. 08/15/18   Mayo, Pete Pelt, MD  sucralfate (CARAFATE) 1  g tablet Take 1 tablet (1 g total) by mouth 4 (four) times daily. 05/29/16   Carrie Mew, MD  tamsulosin (FLOMAX) 0.4 MG CAPS capsule Take 1 capsule (0.4 mg total) by mouth daily. 09/27/16   Gladstone Lighter, MD  terazosin (HYTRIN) 5 MG capsule Take 1 capsule (5 mg total) by mouth at bedtime. 09/27/16   Gladstone Lighter, MD  traZODone (DESYREL) 100 MG tablet Take 100 mg by mouth at bedtime as needed for sleep.  10/22/15   [provider]     Allergies Cyclobenzaprine, Clonidine, Furosemide, and Tylenol [acetaminophen]  No family history on file.  Social History Social History   Tobacco Use  . Smoking status: Current Every Day Smoker    Packs/day: 0.50    Types: Cigarettes  . Smokeless tobacco: Never Used  Substance Use Topics  . Alcohol use: No  . Drug use: No    Comment: pt has been positive for cocaine twice this week but denies use    Review of Systems  Constitutional: No fever/chills Eyes: No visual changes.  ENT: No sore throat. Cardiovascular: Denies chest pain. Respiratory: As above Gastrointestinal: No abdominal pain.   Genitourinary: Negative for dysuria. Musculoskeletal: Negative for back pain. Skin: Negative for rash. Neurological: Negative for headaches    ____________________________________________   PHYSICAL EXAM:  VITAL SIGNS: ED Triage Vitals  Enc Vitals Group     BP 06/13/19 1543 (!) 159/74     Pulse Rate 06/13/19 1543 97     Resp 06/13/19 1543 16     Temp 06/13/19 1544 99.6 F (37.6 C)     Temp Source 06/13/19 1543 Oral     SpO2 06/13/19 1543 99 %     Weight 06/13/19 1602 129.2 kg (284 lb 13.4 oz)     Height --      Head Circumference --      Peak Flow --      Pain Score 06/13/19 1541 8     Pain Loc --      Pain Edu? --      Excl. in [place]? --     Constitutional: Alert and oriented.   Nose: No congestion/rhinnorhea. Mouth/Throat: Mucous membranes are moist.    Cardiovascular: Normal rate, regular rhythm. Grossly  normal heart sounds.  Good peripheral circulation. Respiratory: Normal respiratory effort.  No retractions.  Clear to auscultation, no crackles or increased work of breathing Gastrointestinal: Soft and nontender. No distention.  No CVA tenderness.  Musculoskeletal: No lower extremity tenderness nor edema.  Warm and well perfused Neurologic:  Normal speech and language. No gross focal neurologic deficits are appreciated.  Skin:  Skin is warm, dry and intact. No rash noted. Psychiatric: Mood and affect are normal. Speech and behavior are normal.  ____________________________________________   LABS (all labs ordered are listed, but only abnormal results are displayed)  Labs Reviewed  BASIC METABOLIC PANEL - Abnormal; Notable for the following components:  Result Value   Chloride 97 (*)    Glucose, Bld 104 (*)    BUN 34 (*)    Creatinine, Ser 9.21 (*)    GFR calc non Af Amer 6 (*)    GFR calc Af Amer 6 (*)    All other components within normal limits  CBC - Abnormal; Notable for the following components:   RBC 4.05 (*)    Hemoglobin 11.9 (*)    HCT 35.7 (*)    RDW 16.0 (*)    All other components within normal limits  URINALYSIS, COMPLETE (UACMP) WITH MICROSCOPIC   ____________________________________________  EKG  ED ECG REPORT I, Lavonia Drafts, the attending physician, personally viewed and interpreted this ECG.  Date: 06/13/2019  Rhythm: Abnormal QRS Axis: normal Intervals: Abnormal ST/T Wave abnormalities: normal Narrative Interpretation: no evidence of acute ischemia  ____________________________________________  RADIOLOGY  None ____________________________________________   PROCEDURES  Procedure(s) performed: No  Procedures   Critical Care performed: No ____________________________________________   INITIAL IMPRESSION / ASSESSMENT AND PLAN / ED COURSE  Pertinent labs & imaging results that were available during my care of the patient were  reviewed by me and considered in my medical decision making (see chart for details).  Patient well-appearing in no acute distress, lab work is overall reassuring, normal potassium, no increased work of breathing.  Discussed with Dr. Juleen China who will work with his social worker to arrange outpatient dialysis hopefully tomorrow.  No indication for emergent dialysis at this time    ____________________________________________   FINAL CLINICAL IMPRESSION(S) / ED DIAGNOSES  Final diagnoses:  Encounter for dialysis [facility])        Note:  This document was prepared using Dragon voice recognition software and may include unintentional dictation errors.   Lavonia Drafts, MD 06/13/19 2018

## 2019-06-13 NOTE — ED Notes (Signed)
Pt discharged home; he refused vitals and said he is going to [facility] so he can be admitted and have dialysis. He states he is here from [place] and does not have a home dialysis location so he is going to need to be admitted, have dialysis, and then be discharged. He seems to think this is an efficient way to handle his dialysis schedule. Pt took discharge papers and left. NAD Noted.

## 2019-06-13 NOTE — ED Triage Notes (Signed)
First Nurse Note:  Patient lives in [place] and has been traveling, so has not had dialysis in over a week.  Patient is AAOx3.  Skin warm and dry. NAD

## 2019-06-13 NOTE — ED Triage Notes (Signed)
Pt to ED with c/o needing dialysis. PT is from [place], states he is here visiting for the holidays and needs dialysis. Pt was last dialyzed x1wk. PT is tue-thurs-sat .

## 2019-06-14 ENCOUNTER — Emergency Department
Admission: EM | Admit: 2019-06-14 | Discharge: 2019-06-14 | Disposition: A | Discharge status: Home / Self Care | Payer: Medicare Other | Attending: Emergency Medicine | Admitting: Emergency Medicine

## 2019-06-14 ENCOUNTER — Other Ambulatory Visit: Payer: Self-pay

## 2019-06-14 DIAGNOSIS — Z96649 Presence of unspecified artificial hip joint: Secondary | ICD-10-CM | POA: Insufficient documentation

## 2019-06-14 DIAGNOSIS — N186 End stage renal disease: Secondary | ICD-10-CM | POA: Insufficient documentation

## 2019-06-14 DIAGNOSIS — F17210 Nicotine dependence, cigarettes, uncomplicated: Secondary | ICD-10-CM | POA: Diagnosis not present

## 2019-06-14 DIAGNOSIS — Z7982 Long term (current) use of aspirin: Secondary | ICD-10-CM | POA: Diagnosis not present

## 2019-06-14 DIAGNOSIS — R197 Diarrhea, unspecified: Secondary | ICD-10-CM | POA: Insufficient documentation

## 2019-06-14 DIAGNOSIS — I120 Hypertensive chronic kidney disease with stage 5 chronic kidney disease or end stage renal disease: Secondary | ICD-10-CM | POA: Diagnosis not present

## 2019-06-14 DIAGNOSIS — R112 Nausea with vomiting, unspecified: Secondary | ICD-10-CM | POA: Insufficient documentation

## 2019-06-14 DIAGNOSIS — Z992 Dependence on renal dialysis: Secondary | ICD-10-CM | POA: Diagnosis not present

## 2019-06-14 DIAGNOSIS — K529 Noninfective gastroenteritis and colitis, unspecified: Secondary | ICD-10-CM | POA: Diagnosis not present

## 2019-06-14 DIAGNOSIS — Z79899 Other long term (current) drug therapy: Secondary | ICD-10-CM | POA: Diagnosis not present

## 2019-06-14 DIAGNOSIS — Z7901 Long term (current) use of anticoagulants: Secondary | ICD-10-CM | POA: Insufficient documentation

## 2019-06-14 DIAGNOSIS — R109 Unspecified abdominal pain: Secondary | ICD-10-CM | POA: Diagnosis present

## 2019-06-14 LAB — CBC WITH DIFFERENTIAL/PLATELET
Abs Immature Granulocytes: 0.04 10*3/uL (ref 0.00–0.07)
Basophils Absolute: 0.1 10*3/uL (ref 0.0–0.1)
Basophils Relative: 1 %
Eosinophils Absolute: 0.1 10*3/uL (ref 0.0–0.5)
Eosinophils Relative: 2 %
HCT: 34.8 % — ABNORMAL LOW (ref 39.0–52.0)
Hemoglobin: 11.6 g/dL — ABNORMAL LOW (ref 13.0–17.0)
Immature Granulocytes: 1 %
Lymphocytes Relative: 23 %
Lymphs Abs: 1.8 10*3/uL (ref 0.7–4.0)
MCH: 29.7 pg (ref 26.0–34.0)
MCHC: 33.3 g/dL (ref 30.0–36.0)
MCV: 89 fL (ref 80.0–100.0)
Monocytes Absolute: 0.7 10*3/uL (ref 0.1–1.0)
Monocytes Relative: 9 %
Neutro Abs: 5.4 10*3/uL (ref 1.7–7.7)
Neutrophils Relative %: 64 %
Platelets: 157 10*3/uL (ref 150–400)
RBC: 3.91 MIL/uL — ABNORMAL LOW (ref 4.22–5.81)
RDW: 16.2 % — ABNORMAL HIGH (ref 11.5–15.5)
WBC: 8.2 10*3/uL (ref 4.0–10.5)
nRBC: 0 % (ref 0.0–0.2)

## 2019-06-14 LAB — COMPREHENSIVE METABOLIC PANEL
ALT: 18 U/L (ref 0–44)
AST: 27 U/L (ref 15–41)
Albumin: 4 g/dL (ref 3.5–5.0)
Alkaline Phosphatase: 54 U/L (ref 38–126)
Anion gap: 13 (ref 5–15)
BUN: 39 mg/dL — ABNORMAL HIGH (ref 8–23)
CO2: 29 mmol/L (ref 22–32)
Calcium: 9.4 mg/dL (ref 8.9–10.3)
Chloride: 98 mmol/L (ref 98–111)
Creatinine, Ser: 10.19 mg/dL — ABNORMAL HIGH (ref 0.61–1.24)
GFR calc Af Amer: 6 mL/min — ABNORMAL LOW (ref >60)
GFR calc non Af Amer: 5 mL/min — ABNORMAL LOW (ref >60)
Glucose, Bld: 98 mg/dL (ref 70–99)
Potassium: 3.5 mmol/L (ref 3.5–5.1)
Sodium: 140 mmol/L (ref 135–145)
Total Bilirubin: 0.9 mg/dL (ref 0.3–1.2)
Total Protein: 7.3 g/dL (ref 6.5–8.1)

## 2019-06-14 LAB — LIPASE, BLOOD: Lipase: 38 U/L (ref 11–51)

## 2019-06-14 MED ORDER — MORPHINE SULFATE (PF) 4 MG/ML IV SOLN
4.0000 mg | Freq: Once | INTRAVENOUS | Status: AC
  Administered 2019-06-14: 4 mg via INTRAVENOUS
  Filled 2019-06-14: qty 1

## 2019-06-14 MED ORDER — ACETAMINOPHEN 500 MG PO TABS
1000.0000 mg | ORAL_TABLET | Freq: Once | ORAL | Status: AC
  Administered 2019-06-14: 1000 mg via ORAL
  Filled 2019-06-14: qty 2

## 2019-06-14 MED ORDER — ONDANSETRON HCL 4 MG/2ML IJ SOLN
4.0000 mg | Freq: Once | INTRAMUSCULAR | Status: AC
  Administered 2019-06-14: 4 mg via INTRAVENOUS
  Filled 2019-06-14: qty 2

## 2019-06-14 NOTE — ED Notes (Signed)
Report received from [place], [place]

## 2019-06-14 NOTE — ED Provider Notes (Addendum)
[facility] Emergency Department Provider Note  ____________________________________________  Time seen: Approximately 1:27 AM  I have reviewed the triage vital signs and the nursing notes.   HISTORY  Chief Complaint Abdominal Pain   HPI [name] is a 63 y.o. male with a history of ESRD on HD, PE on Eliquis, hypertension, hepatitis C  who presents for evaluation of missed dialysis and abdominal pain.  Patient reports that he had been living in [place] and moved back to [place] 2 weeks ago and has no doctor or dialysis center here yet.  Has not undergone dialysis for the past 2 weeks.  He still makes a little bit of urine.  Over the last 2 to 3 days he has had diffuse cramping severe abdominal pain associated with more than 10 daily episodes of watery diarrhea and 2-3 episodes of nonbloody nonbilious.  No prior history of C. difficile or recent antibiotic use.  No fever or chills, no cough, no sore throat, no loss of taste or smell, no chest pain or shortness of breath.  No melena, hematemesis, hematochezia, coffee-ground emesis.  Past Medical History:  Diagnosis Date  . Depression   . Heart attack ([place])   . Hypertension   . PE (pulmonary embolism)   . Renal disorder     Patient Active Problem List   Diagnosis Date Noted  . Fluid overload 11/15/2018  . ESRD (end stage renal disease) on dialysis ([place]) 11/14/2018  . HCAP (healthcare-associated pneumonia) 08/27/2018  . Shortness of breath 08/25/2018  . Chest pain 08/20/2018  . ESRD on dialysis ([place]) 11/08/2016  . Prostate cancer screening 11/08/2016  . Acquired cyst of kidney 11/08/2016  . Urinary urgency 11/08/2016  . Acute on chronic renal failure ([place]) 09/23/2016  . Hypertensive urgency 09/21/2016  . Dysthymia 02/26/2016  . Chronic pain 02/26/2016  . Noncompliance 02/26/2016  . Opiate abuse, continuous ([place]) 02/03/2016  . Substance induced mood disorder ([place]) 02/03/2016  . Antisocial  personality disorder ([place]) 02/03/2016  . Left-sided weakness 01/18/2016  . HEPATITIS C 06/28/2008  . HLD (hyperlipidemia) 06/28/2008  . SUBSTANCE ABUSE, MULTIPLE 06/28/2008  . Hypertension 06/27/2008    Past Surgical History:  Procedure Laterality Date  . TOTAL HIP ARTHROPLASTY      Prior to Admission medications   Medication Sig Start Date End Date Taking? Authorizing Provider  albuterol (PROVENTIL HFA;VENTOLIN HFA) 108 (90 Base) MCG/ACT inhaler Inhale 2 puffs into the lungs every 6 (six) hours as needed for wheezing or shortness of breath. 08/30/18   Salary, Holly Bodily D, MD  amLODipine (NORVASC) 10 MG tablet Take 10 mg by mouth daily. 11/27/15   [provider]  apixaban (ELIQUIS) 2.5 MG TABS tablet Take 2.5 mg by mouth 2 (two) times daily.    [provider]  aspirin EC 81 MG tablet Take 81 mg by mouth daily.    [provider]  atorvastatin (LIPITOR) 40 MG tablet Take 40 mg by mouth at bedtime.  11/27/15   [provider]  benzonatate (TESSALON) 100 MG capsule Take 1 capsule (100 mg total) by mouth 3 (three) times daily as needed for cough. 08/30/18   Salary, Avel Peace, MD  docusate sodium (COLACE) 100 MG capsule Take 1 capsule (100 mg total) by mouth 2 (two) times daily. 11/16/18   Vaughan Basta, MD  hydrALAZINE (APRESOLINE) 100 MG tablet Take 1 tablet (100 mg total) by mouth 3 (three) times daily. 09/27/16   Gladstone Lighter, MD  isosorbide mononitrate (IMDUR) 60 MG  24 hr tablet Take 1 tablet (60 mg total) by mouth daily. 09/28/16   Gladstone Lighter, MD  nicotine (NICODERM CQ - DOSED IN MG/24 HOURS) 14 mg/24hr patch Place 1 patch (14 mg total) onto the skin daily. Patient not taking: Reported on 11/14/2018 08/31/18   Salary, Holly Bodily D, MD  Oxycodone HCl 10 MG TABS Take 10-20 mg by mouth every 4 (four) hours as needed (for pain).  04/11/16   [provider]  polyethylene glycol (MIRALAX / GLYCOLAX) packet Take 17 g by mouth daily as needed  for mild constipation.  11/27/15   [provider]  senna-docusate (SENOKOT-S) 8.6-50 MG tablet Take 1 tablet by mouth 2 (two) times daily. 09/27/16   Gladstone Lighter, MD  sevelamer carbonate (RENVELA) 800 MG tablet Take 2 tablets (1,600 mg total) by mouth 3 (three) times daily with meals. 08/15/18   Mayo, Pete Pelt, MD  sucralfate (CARAFATE) 1 g tablet Take 1 tablet (1 g total) by mouth 4 (four) times daily. 05/29/16   Carrie Mew, MD  tamsulosin (FLOMAX) 0.4 MG CAPS capsule Take 1 capsule (0.4 mg total) by mouth daily. 09/27/16   Gladstone Lighter, MD  terazosin (HYTRIN) 5 MG capsule Take 1 capsule (5 mg total) by mouth at bedtime. 09/27/16   Gladstone Lighter, MD  traZODone (DESYREL) 100 MG tablet Take 100 mg by mouth at bedtime as needed for sleep.  10/22/15   [provider]    Allergies Cyclobenzaprine, Clonidine, Furosemide, and Tylenol [acetaminophen]  History reviewed. No pertinent family history.  Social History Social History   Tobacco Use  . Smoking status: Current Every Day Smoker    Packs/day: 0.50    Types: Cigarettes  . Smokeless tobacco: Never Used  Substance Use Topics  . Alcohol use: No  . Drug use: No    Comment: pt has been positive for cocaine twice this week but denies use    Review of Systems  Constitutional: Negative for fever. Eyes: Negative for visual changes. ENT: Negative for sore throat. Neck: No neck pain  Cardiovascular: Negative for chest pain. Respiratory: Negative for shortness of breath. Gastrointestinal: +abdominal pain, vomiting and diarrhea. Genitourinary: Negative for dysuria. Musculoskeletal: Negative for back pain. Skin: Negative for rash. Neurological: Negative for headaches, weakness or numbness. Psych: No SI or HI  ____________________________________________   PHYSICAL EXAM:  VITAL SIGNS: ED Triage Vitals  Enc Vitals Group     BP --      Pulse --      Resp --      Temp 06/14/19 0116 98.6 F (37  C)     Temp Source 06/14/19 0116 Oral     SpO2 --      Weight 06/14/19 0114 284 lb 13.4 oz (129.2 kg)     Height 06/14/19 0114 6\' 6"  (1.981 m)     Head Circumference --      Peak Flow --      Pain Score 06/14/19 0114 10     Pain Loc --      Pain Edu? --      Excl. in [place]? --     Constitutional: Alert and oriented. Well appearing and in no apparent distress. HEENT:      Head: Normocephalic and atraumatic.         Eyes: Conjunctivae are normal. Sclera is non-icteric.       Mouth/Throat: Mucous membranes are moist.       Neck: Supple with no signs of meningismus. Cardiovascular: Regular rate and  rhythm. No murmurs, gallops, or rubs. 2+ symmetrical distal pulses are present in all extremities. No JVD. Respiratory: Normal respiratory effort. Lungs are clear to auscultation bilaterally. No wheezes, crackles, or rhonchi.  Gastrointestinal: Soft, mild diffuse tenderness to palpation, and non distended with positive bowel sounds. No rebound or guarding. Genitourinary: No CVA tenderness. Musculoskeletal: Nontender with normal range of motion in all extremities. No edema, cyanosis, or erythema of extremities. Neurologic: Normal speech and language. Face is symmetric. Moving all extremities. No gross focal neurologic deficits are appreciated. Skin: Skin is warm, dry and intact. No rash noted. Psychiatric: Mood and affect are normal. Speech and behavior are normal.  ____________________________________________   LABS (all labs ordered are listed, but only abnormal results are displayed)  Labs Reviewed  COMPREHENSIVE METABOLIC PANEL - Abnormal; Notable for the following components:      Result Value   BUN 39 (*)    Creatinine, Ser 10.19 (*)    GFR calc non Af Amer 5 (*)    GFR calc Af Amer 6 (*)    All other components within normal limits  CBC WITH DIFFERENTIAL/PLATELET - Abnormal; Notable for the following components:   RBC 3.91 (*)    Hemoglobin 11.6 (*)    HCT 34.8 (*)    RDW 16.2  (*)    All other components within normal limits  C DIFFICILE QUICK SCREEN W PCR REFLEX  LIPASE, BLOOD  URINALYSIS, COMPLETE (UACMP) WITH MICROSCOPIC   ____________________________________________  EKG  ED ECG REPORT I, Rudene Re, the attending physician, personally viewed and interpreted this ECG.  Sinus rhythm with multiple PVCs, normal QTC, left axis deviation, no ST elevations or depressions.  No significant changes when compared to prior. ____________________________________________  RADIOLOGY  I have personally reviewed the images performed during this visit and I agree with the Radiologist's read.   Interpretation by Radiologist:  No results found.    ____________________________________________   PROCEDURES  Procedure(s) performed: None Procedures Critical Care performed:  None ____________________________________________   INITIAL IMPRESSION / ASSESSMENT AND PLAN / ED COURSE   63 y.o. male with a history of ESRD on HD, PE on Eliquis, hypertension, hepatitis C  who presents for evaluation of missed dialysis and abdominal pain.   # missed HD for 2 weeks. Patient still makes urine. Will check labs for need of emergent dialysis, otherwise patient will stay in the ED until morning for HD since he does not have an outpatient center to go to. EKG with no evidence of hyperkalemia. Labs done 24hrs ago showing no hyperkalemia or acidosis. No SOB, normal WOB, normal sats, clear lungs with no evidence of edema. Patient will need to be plugged into the system for ongoing dialysis treatments.  # abd pain, vomiting, diarrhea: Ddx colitis, gastroenteritis, C. Diff, COVID. Will give morphine and zofran for pain, check labs and send stool for C. Diff.  Clinical Course as of Jun 13 318  Fri Jun 14, 2019  0301 Patient demanding to be fed. Calling all RNs and ED staff, demanding food. Normal labs   [CV]  0312 No hyperkalemia or acidosis, no hypoxia or difficulty  breathing.  Blood pressure slightly elevated but not significantly.  No indication for emergent overnight dialysis.  We will keep patient emergency room and dialyze in the morning.  Will consult nephrology.  Patient is tolerating p.o. no further episodes of vomiting.  Has been in the emergency department for 2 hours with no further episodes of diarrhea.  We will plan to send  a stool sample if he has diarrhea while here.   [CV]    Clinical Course User Index [CV] Rudene Re, MD   _________________________ 6:44 AM on 06/14/2019 -----------------------------------------  Discussed with Dr. Juleen China who said his social worker reached out the patient yesterday and gave him an appointment for dialysis and patient did not show. Unfortunately, Dr. Juleen China said there are too many patients today needing dialysis and he will not be able to dialyse the patient out of the ED. Since patient does not meet criteria for need for emergent dialysis, he recommended discharging him home and his SW will reach to the patient again for dialysis. Discussed my standard return precautions with patient.  Will discharge home at this time.   As part of my medical decision making, I reviewed the following data within the [place] notes reviewed and incorporated, Labs reviewed , EKG interpreted , Old EKG reviewed, Old chart reviewed, Notes from prior ED visits and [place] Controlled Substance Database   Please note:  Patient was evaluated in Emergency Department today for the symptoms described in the history of present illness. Patient was evaluated in the context of the global COVID-19 pandemic, which necessitated consideration that the patient might be at risk for infection with the SARS-CoV-2 virus that causes COVID-19. Institutional protocols and algorithms that pertain to the evaluation of patients at risk for COVID-19 are in a state of rapid change based on information released by regulatory  bodies including the CDC and federal and state organizations. These policies and algorithms were followed during the patient's care in the ED.  Some ED evaluations and interventions may be delayed as a result of limited staffing during the pandemic.   ____________________________________________   FINAL CLINICAL IMPRESSION(S) / ED DIAGNOSES   Final diagnoses:  Nausea vomiting and diarrhea  Gastroenteritis  End-stage renal disease needing dialysis ([place])      NEW MEDICATIONS STARTED DURING THIS VISIT:  ED Discharge Orders    None       Note:  This document was prepared using Dragon voice recognition software and may include unintentional dictation errors.    Alfred Levins, [place], MD 06/14/19 [place], [place], MD 06/14/19 [phone]

## 2019-06-14 NOTE — Progress Notes (Signed)
PIV placed by ED staff. VAST consult no longer needed per RN.

## 2019-06-14 NOTE — ED Notes (Signed)
Pt given meal tray and gingerale per request with verbal okay from [place].

## 2019-06-14 NOTE — ED Notes (Signed)
This RN handed pt d/c instruction. Pt states, "I don't want them" and proceeds to throw d/c papers in trashcan.

## 2019-06-14 NOTE — ED Triage Notes (Signed)
Pt arrived via [facility] from home with missed dialysis for about 2 weeks. Pt very weak with abd pain. Diarrhea and vomiting. HX of htn. Left arm fistula.

## 2019-06-14 NOTE — Progress Notes (Signed)
Made aware about patient last night around 8 pm by Dr. Corky Downs. Informed that patient has moved back to [place] and has not gotten dialysis in over 2 weeks. Reports patient is in no distress and does not need emergent dialysis. It was then decided that we would work on getting him a outpatient dialysis clinic today. Dialysis liaison, Elvera Bicker, was contacted. Since the patient is established with out practice and has been getting dialysis at our outpatient units before, this should not be a problem.  However it patient does need emergent hemodialysis during this admission, please notify me.   Lurena Nida Eleny Cortez 06/14/19

## 2019-06-21 ENCOUNTER — Other Ambulatory Visit: Payer: Self-pay

## 2019-06-21 ENCOUNTER — Inpatient Hospital Stay
Admission: EM | Admit: 2019-06-21 | Discharge: 2019-06-23 | DRG: 640 | Disposition: A | Discharge status: Home / Self Care | Payer: Medicare Other | Attending: Internal Medicine | Admitting: Internal Medicine

## 2019-06-21 ENCOUNTER — Emergency Department: Payer: Medicare Other

## 2019-06-21 ENCOUNTER — Encounter: Payer: Self-pay | Admitting: Intensive Care

## 2019-06-21 DIAGNOSIS — F17210 Nicotine dependence, cigarettes, uncomplicated: Secondary | ICD-10-CM | POA: Diagnosis present

## 2019-06-21 DIAGNOSIS — R079 Chest pain, unspecified: Secondary | ICD-10-CM

## 2019-06-21 DIAGNOSIS — N186 End stage renal disease: Secondary | ICD-10-CM | POA: Diagnosis present

## 2019-06-21 DIAGNOSIS — I252 Old myocardial infarction: Secondary | ICD-10-CM

## 2019-06-21 DIAGNOSIS — E8779 Other fluid overload: Secondary | ICD-10-CM | POA: Diagnosis not present

## 2019-06-21 DIAGNOSIS — F329 Major depressive disorder, single episode, unspecified: Secondary | ICD-10-CM | POA: Diagnosis present

## 2019-06-21 DIAGNOSIS — F602 Antisocial personality disorder: Secondary | ICD-10-CM | POA: Diagnosis present

## 2019-06-21 DIAGNOSIS — I10 Essential (primary) hypertension: Secondary | ICD-10-CM | POA: Diagnosis present

## 2019-06-21 DIAGNOSIS — E8770 Fluid overload, unspecified: Secondary | ICD-10-CM | POA: Diagnosis not present

## 2019-06-21 DIAGNOSIS — Z992 Dependence on renal dialysis: Secondary | ICD-10-CM

## 2019-06-21 DIAGNOSIS — R0789 Other chest pain: Secondary | ICD-10-CM | POA: Diagnosis present

## 2019-06-21 DIAGNOSIS — Z7151 Drug abuse counseling and surveillance of drug abuser: Secondary | ICD-10-CM

## 2019-06-21 DIAGNOSIS — Z7901 Long term (current) use of anticoagulants: Secondary | ICD-10-CM

## 2019-06-21 DIAGNOSIS — Z888 Allergy status to other drugs, medicaments and biological substances status: Secondary | ICD-10-CM

## 2019-06-21 DIAGNOSIS — F1994 Other psychoactive substance use, unspecified with psychoactive substance-induced mood disorder: Secondary | ICD-10-CM | POA: Diagnosis present

## 2019-06-21 DIAGNOSIS — D631 Anemia in chronic kidney disease: Secondary | ICD-10-CM | POA: Diagnosis present

## 2019-06-21 DIAGNOSIS — G8929 Other chronic pain: Secondary | ICD-10-CM | POA: Diagnosis present

## 2019-06-21 DIAGNOSIS — I2510 Atherosclerotic heart disease of native coronary artery without angina pectoris: Secondary | ICD-10-CM | POA: Diagnosis present

## 2019-06-21 DIAGNOSIS — I160 Hypertensive urgency: Secondary | ICD-10-CM | POA: Diagnosis present

## 2019-06-21 DIAGNOSIS — F1490 Cocaine use, unspecified, uncomplicated: Secondary | ICD-10-CM | POA: Diagnosis present

## 2019-06-21 DIAGNOSIS — Z79899 Other long term (current) drug therapy: Secondary | ICD-10-CM

## 2019-06-21 DIAGNOSIS — K7402 Hepatic fibrosis, advanced fibrosis: Secondary | ICD-10-CM | POA: Diagnosis present

## 2019-06-21 DIAGNOSIS — N400 Enlarged prostate without lower urinary tract symptoms: Secondary | ICD-10-CM | POA: Diagnosis present

## 2019-06-21 DIAGNOSIS — Z9115 Patient's noncompliance with renal dialysis: Secondary | ICD-10-CM

## 2019-06-21 DIAGNOSIS — Z881 Allergy status to other antibiotic agents status: Secondary | ICD-10-CM

## 2019-06-21 DIAGNOSIS — I120 Hypertensive chronic kidney disease with stage 5 chronic kidney disease or end stage renal disease: Secondary | ICD-10-CM | POA: Diagnosis present

## 2019-06-21 DIAGNOSIS — G4700 Insomnia, unspecified: Secondary | ICD-10-CM | POA: Diagnosis present

## 2019-06-21 DIAGNOSIS — F1120 Opioid dependence, uncomplicated: Secondary | ICD-10-CM | POA: Diagnosis present

## 2019-06-21 DIAGNOSIS — E785 Hyperlipidemia, unspecified: Secondary | ICD-10-CM | POA: Diagnosis present

## 2019-06-21 DIAGNOSIS — Z20828 Contact with and (suspected) exposure to other viral communicable diseases: Secondary | ICD-10-CM | POA: Diagnosis present

## 2019-06-21 DIAGNOSIS — R112 Nausea with vomiting, unspecified: Secondary | ICD-10-CM | POA: Diagnosis present

## 2019-06-21 DIAGNOSIS — D696 Thrombocytopenia, unspecified: Secondary | ICD-10-CM | POA: Diagnosis present

## 2019-06-21 DIAGNOSIS — Z86711 Personal history of pulmonary embolism: Secondary | ICD-10-CM

## 2019-06-21 DIAGNOSIS — N2581 Secondary hyperparathyroidism of renal origin: Secondary | ICD-10-CM | POA: Diagnosis present

## 2019-06-21 DIAGNOSIS — Z7982 Long term (current) use of aspirin: Secondary | ICD-10-CM

## 2019-06-21 DIAGNOSIS — Z886 Allergy status to analgesic agent status: Secondary | ICD-10-CM

## 2019-06-21 LAB — BASIC METABOLIC PANEL
Anion gap: 16 — ABNORMAL HIGH (ref 5–15)
BUN: 100 mg/dL — ABNORMAL HIGH (ref 8–23)
CO2: 19 mmol/L — ABNORMAL LOW (ref 22–32)
Calcium: 7.6 mg/dL — ABNORMAL LOW (ref 8.9–10.3)
Chloride: 103 mmol/L (ref 98–111)
Creatinine, Ser: 14.14 mg/dL — ABNORMAL HIGH (ref 0.61–1.24)
GFR calc Af Amer: 4 mL/min — ABNORMAL LOW (ref >60)
GFR calc non Af Amer: 3 mL/min — ABNORMAL LOW (ref >60)
Glucose, Bld: 95 mg/dL (ref 70–99)
Potassium: 4.8 mmol/L (ref 3.5–5.1)
Sodium: 138 mmol/L (ref 135–145)

## 2019-06-21 LAB — TROPONIN I (HIGH SENSITIVITY)
Troponin I (High Sensitivity): 145 ng/L (ref <18)
Troponin I (High Sensitivity): 152 ng/L (ref <18)

## 2019-06-21 LAB — CBC
HCT: 27.9 % — ABNORMAL LOW (ref 39.0–52.0)
Hemoglobin: 9.9 g/dL — ABNORMAL LOW (ref 13.0–17.0)
MCH: 30 pg (ref 26.0–34.0)
MCHC: 35.5 g/dL (ref 30.0–36.0)
MCV: 84.5 fL (ref 80.0–100.0)
Platelets: 103 10*3/uL — ABNORMAL LOW (ref 150–400)
RBC: 3.3 MIL/uL — ABNORMAL LOW (ref 4.22–5.81)
RDW: 16 % — ABNORMAL HIGH (ref 11.5–15.5)
WBC: 5.9 10*3/uL (ref 4.0–10.5)
nRBC: 0 % (ref 0.0–0.2)

## 2019-06-21 LAB — PROTIME-INR
INR: 1.2 (ref 0.8–1.2)
Prothrombin Time: 15.2 seconds (ref 11.4–15.2)

## 2019-06-21 LAB — PHOSPHORUS: Phosphorus: 9 mg/dL — ABNORMAL HIGH (ref 2.5–4.6)

## 2019-06-21 LAB — MAGNESIUM: Magnesium: 2.5 mg/dL — ABNORMAL HIGH (ref 1.7–2.4)

## 2019-06-21 MED ORDER — ISOSORBIDE MONONITRATE ER 60 MG PO TB24
60.0000 mg | ORAL_TABLET | Freq: Every day | ORAL | Status: DC
  Administered 2019-06-22 – 2019-06-23 (×2): 60 mg via ORAL
  Filled 2019-06-21 (×3): qty 1

## 2019-06-21 MED ORDER — DOCUSATE SODIUM 100 MG PO CAPS
100.0000 mg | ORAL_CAPSULE | Freq: Two times a day (BID) | ORAL | Status: DC
  Administered 2019-06-22 – 2019-06-23 (×4): 100 mg via ORAL
  Filled 2019-06-21 (×4): qty 1

## 2019-06-21 MED ORDER — OXYCODONE HCL 5 MG PO TABS
10.0000 mg | ORAL_TABLET | ORAL | Status: DC | PRN
  Administered 2019-06-22: 10 mg via ORAL
  Administered 2019-06-22: 20 mg via ORAL
  Filled 2019-06-21: qty 4
  Filled 2019-06-21: qty 2
  Filled 2019-06-21: qty 4
  Filled 2019-06-21: qty 2

## 2019-06-21 MED ORDER — SEVELAMER CARBONATE 800 MG PO TABS
1600.0000 mg | ORAL_TABLET | Freq: Three times a day (TID) | ORAL | Status: DC
  Administered 2019-06-23 (×2): 1600 mg via ORAL
  Filled 2019-06-21 (×5): qty 2

## 2019-06-21 MED ORDER — HYDRALAZINE HCL 50 MG PO TABS
100.0000 mg | ORAL_TABLET | Freq: Three times a day (TID) | ORAL | Status: DC
  Administered 2019-06-22 – 2019-06-23 (×4): 100 mg via ORAL
  Filled 2019-06-21 (×7): qty 2

## 2019-06-21 MED ORDER — APIXABAN 2.5 MG PO TABS
2.5000 mg | ORAL_TABLET | Freq: Two times a day (BID) | ORAL | Status: DC
  Administered 2019-06-22 – 2019-06-23 (×3): 2.5 mg via ORAL
  Filled 2019-06-21 (×6): qty 1

## 2019-06-21 MED ORDER — ATORVASTATIN CALCIUM 20 MG PO TABS
40.0000 mg | ORAL_TABLET | Freq: Every day | ORAL | Status: DC
  Administered 2019-06-22 (×2): 40 mg via ORAL
  Filled 2019-06-21 (×3): qty 2

## 2019-06-21 MED ORDER — TRAZODONE HCL 100 MG PO TABS
100.0000 mg | ORAL_TABLET | Freq: Every evening | ORAL | Status: DC | PRN
  Filled 2019-06-21: qty 1

## 2019-06-21 MED ORDER — ASPIRIN EC 81 MG PO TBEC
81.0000 mg | DELAYED_RELEASE_TABLET | Freq: Every day | ORAL | Status: DC
  Administered 2019-06-22 – 2019-06-23 (×2): 81 mg via ORAL
  Filled 2019-06-21 (×3): qty 1

## 2019-06-21 MED ORDER — NICOTINE 14 MG/24HR TD PT24
14.0000 mg | MEDICATED_PATCH | Freq: Every day | TRANSDERMAL | Status: DC
  Administered 2019-06-22: 14 mg via TRANSDERMAL
  Filled 2019-06-21 (×2): qty 1

## 2019-06-21 MED ORDER — ONDANSETRON HCL 4 MG/2ML IJ SOLN
4.0000 mg | Freq: Four times a day (QID) | INTRAMUSCULAR | Status: DC | PRN
  Administered 2019-06-22: 4 mg via INTRAVENOUS
  Filled 2019-06-21: qty 2

## 2019-06-21 MED ORDER — ACETAMINOPHEN 325 MG PO TABS: 650.0000 mg | ORAL_TABLET | ORAL | Status: DC | PRN

## 2019-06-21 MED ORDER — TAMSULOSIN HCL 0.4 MG PO CAPS
0.4000 mg | ORAL_CAPSULE | Freq: Every day | ORAL | Status: DC
  Administered 2019-06-22 – 2019-06-23 (×2): 0.4 mg via ORAL
  Filled 2019-06-21 (×3): qty 1

## 2019-06-21 MED ORDER — MORPHINE SULFATE (PF) 4 MG/ML IV SOLN
4.0000 mg | Freq: Once | INTRAVENOUS | Status: AC
  Administered 2019-06-21: 4 mg via INTRAVENOUS
  Filled 2019-06-21: qty 1

## 2019-06-21 MED ORDER — POLYETHYLENE GLYCOL 3350 17 G PO PACK
17.0000 g | PACK | Freq: Every day | ORAL | Status: DC | PRN
  Filled 2019-06-21: qty 1

## 2019-06-21 MED ORDER — TERAZOSIN HCL 5 MG PO CAPS
5.0000 mg | ORAL_CAPSULE | Freq: Every day | ORAL | Status: DC
  Administered 2019-06-22: 5 mg via ORAL
  Filled 2019-06-21 (×4): qty 1

## 2019-06-21 MED ORDER — CHLORHEXIDINE GLUCONATE CLOTH 2 % EX PADS
6.0000 | MEDICATED_PAD | Freq: Every day | CUTANEOUS | Status: DC
  Filled 2019-06-21: qty 6

## 2019-06-21 MED ORDER — ALBUTEROL SULFATE HFA 108 (90 BASE) MCG/ACT IN AERS
2.0000 | INHALATION_SPRAY | Freq: Four times a day (QID) | RESPIRATORY_TRACT | Status: DC | PRN
  Filled 2019-06-21: qty 6.7

## 2019-06-21 MED ORDER — AMLODIPINE BESYLATE 10 MG PO TABS
10.0000 mg | ORAL_TABLET | Freq: Every day | ORAL | Status: DC
  Administered 2019-06-22 – 2019-06-23 (×2): 10 mg via ORAL
  Filled 2019-06-21: qty 1
  Filled 2019-06-21: qty 2

## 2019-06-21 NOTE — ED Notes (Signed)
Pt transported to dialysis

## 2019-06-21 NOTE — H&P (Signed)
History and Physical    [name] [id number] DOB: [birth year] DOA: 06/21/2019  PCP: Patient, No Pcp Per  Patient coming from: home   Chief Complaint: "I need dialysis"  HPI: [name] is a 63 y.o. male with medical history significant for ESRD on tu/th/sat hemodialysis, PE, HTN, CAD, HCV (cured) with advanced fibrosis, substance use disorder, who presents with above.  Moved to [place] from [place] about a week ago. Moved here because says a relative will be donating a kidney. But did not establish with dialysis provider or any other providers prior to making the move. Currently living with his parents.   Received dialysis in [place], where previously lived. Last dialysis was about 10 days ago. Has also been without most or all of his meds for about a week. Says has noticed over the past several days increased swelling throughout body, including face. Knew he needed dialysis and so came in.   Does endorse intermittent palpitations for the past few says, and also one day of low sternal pressure. Does not radiate, is not exertional. Has had this pain before, thinks also when hasn't had dialysis. No cough or shortness of breath. No vomiting or diarrhea. No abdominal Pain. No sick contacts, no known covid contacts.  Denies recent alcohol use. Last used crack cocaine a few days ago. Denies other substance use other than daily cigarettes.   ED Course: labs, nephrology consult, dialysis  Review of Systems: As per HPI otherwise 10 point review of systems negative.    Past Medical History:  Diagnosis Date  . Depression   . Heart attack ([place])   . Hypertension   . PE (pulmonary embolism)   . Renal disorder     Past Surgical History:  Procedure Laterality Date  . TOTAL HIP ARTHROPLASTY       reports that he has been smoking cigarettes. He has been smoking about 0.50 packs per day. He has never used smokeless tobacco. He reports that he does not drink alcohol or  use drugs.  Allergies  Allergen Reactions  . Cyclobenzaprine Nausea Only  . Clonidine Other (See Comments)    Asymptomatic bradycardia to 38  . Furosemide Other (See Comments)    Caused renal failure  . Tylenol [Acetaminophen] Other (See Comments)    Reaction:  Pt states it bothers his liver    History reviewed. No pertinent family history.  Prior to Admission medications   Medication Sig Start Date End Date Taking? Authorizing Provider  albuterol (PROVENTIL HFA;VENTOLIN HFA) 108 (90 Base) MCG/ACT inhaler Inhale 2 puffs into the lungs every 6 (six) hours as needed for wheezing or shortness of breath. 08/30/18   Salary, Holly Bodily D, MD  amLODipine (NORVASC) 10 MG tablet Take 10 mg by mouth daily. 11/27/15   [provider]  apixaban (ELIQUIS) 2.5 MG TABS tablet Take 2.5 mg by mouth 2 (two) times daily.    [provider]  aspirin EC 81 MG tablet Take 81 mg by mouth daily.    [provider]  atorvastatin (LIPITOR) 40 MG tablet Take 40 mg by mouth at bedtime.  11/27/15   [provider]  benzonatate (TESSALON) 100 MG capsule Take 1 capsule (100 mg total) by mouth 3 (three) times daily as needed for cough. 08/30/18   Salary, Avel Peace, MD  docusate sodium (COLACE) 100 MG capsule Take 1 capsule (100 mg total) by mouth 2 (two) times daily. 11/16/18   Vaughan Basta, MD  hydrALAZINE (APRESOLINE) 100 MG tablet  Take 1 tablet (100 mg total) by mouth 3 (three) times daily. 09/27/16   Gladstone Lighter, MD  isosorbide mononitrate (IMDUR) 60 MG 24 hr tablet Take 1 tablet (60 mg total) by mouth daily. 09/28/16   Gladstone Lighter, MD  nicotine (NICODERM CQ - DOSED IN MG/24 HOURS) 14 mg/24hr patch Place 1 patch (14 mg total) onto the skin daily. Patient not taking: Reported on 11/14/2018 08/31/18   Salary, Holly Bodily D, MD  Oxycodone HCl 10 MG TABS Take 10-20 mg by mouth every 4 (four) hours as needed (for pain).  04/11/16   [provider]  polyethylene glycol  (MIRALAX / GLYCOLAX) packet Take 17 g by mouth daily as needed for mild constipation.  11/27/15   [provider]  senna-docusate (SENOKOT-S) 8.6-50 MG tablet Take 1 tablet by mouth 2 (two) times daily. 09/27/16   Gladstone Lighter, MD  sevelamer carbonate (RENVELA) 800 MG tablet Take 2 tablets (1,600 mg total) by mouth 3 (three) times daily with meals. 08/15/18   Mayo, Pete Pelt, MD  sucralfate (CARAFATE) 1 g tablet Take 1 tablet (1 g total) by mouth 4 (four) times daily. 05/29/16   Carrie Mew, MD  tamsulosin (FLOMAX) 0.4 MG CAPS capsule Take 1 capsule (0.4 mg total) by mouth daily. 09/27/16   Gladstone Lighter, MD  terazosin (HYTRIN) 5 MG capsule Take 1 capsule (5 mg total) by mouth at bedtime. 09/27/16   Gladstone Lighter, MD  traZODone (DESYREL) 100 MG tablet Take 100 mg by mouth at bedtime as needed for sleep.  10/22/15   [provider]    Physical Exam: Vitals:   06/21/19 1945 06/21/19 2000 06/21/19 2015 06/21/19 2030  BP: (!) 181/113 (!) 181/109 (!) 179/117 (!) 167/142  Pulse: 78 78 81 92  Resp: (!) 22 (!) 26 (!) 28 (!) 25  Temp:      TempSrc:      SpO2:      Weight:      Height:        Constitutional: No acute distress Head: Atraumatic Eyes: Conjunctiva clear ENM: Moist mucous membranes. Poor dentition.  Neck: Supple Respiratory: Clear to auscultation bilaterally, no wheezing/rales/rhonchi. Normal respiratory effort. No accessory muscle use. . Cardiovascular: Regular rate and rhythm. Soft systolic murmur. No sternal tenderness Abdomen: Non-tender, mildly distended. No masses. No rebound or guarding. Positive bowel sounds. Musculoskeletal: No joint deformity upper and lower extremities. Normal ROM, no contractures. Normal muscle tone.  Skin: No rashes, lesions, or ulcers.  Extremities: trace LE edema Neurologic: Alert, moving all 4 extremities. Psychiatric: Normal insight and judgement.   Labs on Admission: I have personally reviewed following labs  and imaging studies  CBC: Recent Labs  Lab 06/21/19 1437  WBC 5.9  HGB 9.9*  HCT 27.9*  MCV 84.5  PLT [id number]*   Basic Metabolic Panel: Recent Labs  Lab 06/21/19 1437 06/21/19 1711  NA 138  --   K 4.8  --   CL 103  --   CO2 19*  --   GLUCOSE 95  --   BUN 100*  --   CREATININE 14.14*  --   CALCIUM 7.6*  --   MG  --  2.5*  PHOS  --  9.0*   GFR: Estimated Creatinine Clearance: 7.1 mL/min (A) (by C-G formula based on SCr of 14.14 mg/dL (H)). Liver Function Tests: No results for input(s): AST, ALT, ALKPHOS, BILITOT, PROT, ALBUMIN in the last 168 hours. No results for input(s): LIPASE, AMYLASE in the last 168 hours. No  results for input(s): AMMONIA in the last 168 hours. Coagulation Profile: Recent Labs  Lab 06/21/19 1437  INR 1.2   Cardiac Enzymes: No results for input(s): CKTOTAL, CKMB, CKMBINDEX, TROPONINI in the last 168 hours. BNP (last 3 results) No results for input(s): PROBNP in the last 8760 hours. HbA1C: No results for input(s): HGBA1C in the last 72 hours. CBG: No results for input(s): GLUCAP in the last 168 hours. Lipid Profile: No results for input(s): CHOL, HDL, LDLCALC, TRIG, CHOLHDL, LDLDIRECT in the last 72 hours. Thyroid Function Tests: No results for input(s): TSH, T4TOTAL, FREET4, T3FREE, THYROIDAB in the last 72 hours. Anemia Panel: No results for input(s): VITAMINB12, FOLATE, FERRITIN, TIBC, IRON, RETICCTPCT in the last 72 hours. Urine analysis:    Component Value Date/Time   COLORURINE YELLOW (A) 08/20/2018 0230   APPEARANCEUR CLOUDY (A) 08/20/2018 0230   APPEARANCEUR Clear 11/08/2014 1559   LABSPEC 1.010 08/20/2018 0230   LABSPEC 1.010 11/08/2014 1559   PHURINE 9.0 (H) 08/20/2018 0230   GLUCOSEU NEGATIVE 08/20/2018 0230   GLUCOSEU Negative 11/08/2014 1559   HGBUR NEGATIVE 08/20/2018 0230   BILIRUBINUR NEGATIVE 08/20/2018 0230   BILIRUBINUR Negative 11/08/2014 1559   KETONESUR NEGATIVE 08/20/2018 0230   PROTEINUR 100 (A) 08/20/2018  0230   UROBILINOGEN 1.0 10/28/2010 1100   NITRITE POSITIVE (A) 08/20/2018 0230   LEUKOCYTESUR LARGE (A) 08/20/2018 0230   LEUKOCYTESUR Negative 11/08/2014 1559    Radiological Exams on Admission: DG Chest 2 View  Result Date: 06/21/2019 CLINICAL DATA:  Chest pain, shortness of breath, has not received dialysis in greater than 10 days EXAM: CHEST - 2 VIEW COMPARISON:  Radiograph Nov 13, 2018, FINDINGS: Slightly cephalized vascularity with some hazy interstitial change and fissural thickening. Central pulmonary venous congestion is noted. No consolidative opacity. No pneumothorax or visible effusion. Mild cardiomegaly is similar to priors. No acute osseous or soft tissue abnormality. Degenerative changes are present in the imaged spine and shoulders. IMPRESSION: 1. Mild cardiomegaly with mild central pulmonary venous congestion. No overt edema or pleural effusions. Electronically Signed   By: Lovena Le M.D.   On: 06/21/2019 15:17    EKG: Independently reviewed. Nsr, qtc 497, no ischemic changes  Assessment/Plan Principal Problem:   Volume overload Active Problems:   Hypertension   Substance induced mood disorder (HCC)   Antisocial personality disorder (HCC)   ESRD on dialysis ([facility])   Fluid overload   # Fluid overload - 2/2 lack of dialysis for about 1.5 weeks. I evaluated the patient in dialysis after about 2 hours of dialysis. On exam resting comfortably, normal O2, normal WOB. Mild LE edema. Mentating clearly. No significant electrolyte abnormalities - appreciate nephrology recs - will need to establish w/ outpatient dialysis locally - cont renelva  # Chest pain - atypical (substernal, not exertional). Mild. Troponin is elevated (145>152), though in the setting of ESRD. EKG non-ischemic. Off apixaban for at least a week, but no respiratory symptoms to suggest PE. Did recently use cocaine. CXR with mild venous congestion;  fluid overload could play a role here. Low-risk myoview in  2017; tte performed 08/2018 showed normal EF, LVH. - will repeat another troponin this evening, if continues to uptrend or new/worsening symptoms, consider cardiology consult, starting anticoagulation - tele overnight, AM EKG - cont home meds as below  # History PE - resume home apixaban  # Substance abuse - endorses recent crack/cocaine use - f/u UDS  # Chronic pain # Opioid dependence - cont home oxycodone 10-20 q4 prn  #  Anemia - h 9.9, more recently in the 11s. Denies hematochezia, melena, hematemesis, hematuria. - f/u hemoccult; AM CBC  # Tobacco abuse - nicotine patch  # Advanced liver fibrosis 2/2 hcv (cured) # thrombocytopenia - plts 103, more or less stable - outpt hepatology f/u  # HTN - here bp mild/modeately elevated in setting of fluid overload - dialysis as above - resume home antihypertensives (amlodipine, hydrlaazine, imdur) - cont home asa, atorvastatin. Given apixaban, unclear why also taking aspirin - consider d/c  # bph - cont home terazosin, flomax  # Insomnia - cont trazadone   DVT prophylaxis: home anticoagulation Code Status: full Family Communication: parents  Disposition Plan: tbd  Consults called: nephrology  Admission status: obs tele    Desma Maxim MD [facility] Pager [phone]  If 7PM-7AM, please contact night-coverage [email or web address] Password [facility]  06/21/2019, 8:43 PM

## 2019-06-21 NOTE — Progress Notes (Signed)
Post HD Assess   06/21/19 2146  Neurological  Level of Consciousness Alert  Orientation Level Oriented X4  Respiratory  Respiratory Pattern Regular;Unlabored  Chest Assessment Chest expansion symmetrical  Bilateral Breath Sounds Diminished  Cardiac  Pulse Regular  Heart Sounds S1, S2  Jugular Venous Distention (JVD) No  ECG Monitor Yes  Cardiac Rhythm SVT  Ectopy Multifocal PVC's  Vascular  R Radial Pulse +2  L Radial Pulse +2  Musculoskeletal  Musculoskeletal (WDL) WDL  GU Assessment  Genitourinary (WDL) WDL  Urine Characteristics  Urine Color Yellow/straw  Urine Appearance Clear  Psychosocial  Psychosocial (WDL) WDL

## 2019-06-21 NOTE — ED Provider Notes (Addendum)
[facility] Emergency Department Provider Note  ____________________________________________   First MD Initiated Contact with Patient 06/21/19 1654     (approximate)  I have reviewed the triage vital signs and the nursing notes.  History  Chief Complaint Chest Pain    HPI [name] is a 63 y.o. male history of ESRD, CAD, PE on Eliquis, hepatitis C, who presents to the emergency department for chest pain and requesting dialysis.  Patient states he has not dialyzed in at least 10 days.  He states he moved from [place] several weeks ago and has not established primary care/dialysis center yet, however on chart review it appears that social work has been involved multiple times to arrange for outpatient dialysis, but he has yet to establish outpatient dialysis care.  Appears he last dialyzed at [facility] via the ED on 12/4.  He reports a chest discomfort for 2 days.  Central in location. Does not radiate, no alleviating or aggravating factors. He describes it as a pressure and feeling as though he is fluid overloaded.  He reports some mild shortness of breath and nausea with this.    Past Medical Hx Past Medical History:  Diagnosis Date  . Depression   . Heart attack ([place])   . Hypertension   . PE (pulmonary embolism)   . Renal disorder     Problem List Patient Active Problem List   Diagnosis Date Noted  . Fluid overload 11/15/2018  . ESRD (end stage renal disease) on dialysis ([place]) 11/14/2018  . HCAP (healthcare-associated pneumonia) 08/27/2018  . Shortness of breath 08/25/2018  . Chest pain 08/20/2018  . ESRD on dialysis ([place]) 11/08/2016  . Prostate cancer screening 11/08/2016  . Acquired cyst of kidney 11/08/2016  . Urinary urgency 11/08/2016  . Acute on chronic renal failure ([place]) 09/23/2016  . Hypertensive urgency 09/21/2016  . Dysthymia 02/26/2016  . Chronic pain 02/26/2016  . Noncompliance 02/26/2016  . Opiate abuse, continuous ([place])  02/03/2016  . Substance induced mood disorder ([place]) 02/03/2016  . Antisocial personality disorder ([place]) 02/03/2016  . Left-sided weakness 01/18/2016  . HEPATITIS C 06/28/2008  . HLD (hyperlipidemia) 06/28/2008  . SUBSTANCE ABUSE, MULTIPLE 06/28/2008  . Hypertension 06/27/2008    Past Surgical Hx Past Surgical History:  Procedure Laterality Date  . TOTAL HIP ARTHROPLASTY      Medications Prior to Admission medications   Medication Sig Start Date End Date Taking? Authorizing Provider  albuterol (PROVENTIL HFA;VENTOLIN HFA) 108 (90 Base) MCG/ACT inhaler Inhale 2 puffs into the lungs every 6 (six) hours as needed for wheezing or shortness of breath. 08/30/18   Salary, Holly Bodily D, MD  amLODipine (NORVASC) 10 MG tablet Take 10 mg by mouth daily. 11/27/15   [provider]  apixaban (ELIQUIS) 2.5 MG TABS tablet Take 2.5 mg by mouth 2 (two) times daily.    [provider]  aspirin EC 81 MG tablet Take 81 mg by mouth daily.    [provider]  atorvastatin (LIPITOR) 40 MG tablet Take 40 mg by mouth at bedtime.  11/27/15   [provider]  benzonatate (TESSALON) 100 MG capsule Take 1 capsule (100 mg total) by mouth 3 (three) times daily as needed for cough. 08/30/18   Salary, Avel Peace, MD  docusate sodium (COLACE) 100 MG capsule Take 1 capsule (100 mg total) by mouth 2 (two) times daily. 11/16/18   Vaughan Basta, MD  hydrALAZINE (APRESOLINE) 100 MG tablet Take 1 tablet (100 mg total) by mouth 3 (  three) times daily. 09/27/16   Gladstone Lighter, MD  isosorbide mononitrate (IMDUR) 60 MG 24 hr tablet Take 1 tablet (60 mg total) by mouth daily. 09/28/16   Gladstone Lighter, MD  nicotine (NICODERM CQ - DOSED IN MG/24 HOURS) 14 mg/24hr patch Place 1 patch (14 mg total) onto the skin daily. Patient not taking: Reported on 11/14/2018 08/31/18   Salary, Holly Bodily D, MD  Oxycodone HCl 10 MG TABS Take 10-20 mg by mouth every 4 (four) hours as needed (for pain).  04/11/16    [provider]  polyethylene glycol (MIRALAX / GLYCOLAX) packet Take 17 g by mouth daily as needed for mild constipation.  11/27/15   [provider]  senna-docusate (SENOKOT-S) 8.6-50 MG tablet Take 1 tablet by mouth 2 (two) times daily. 09/27/16   Gladstone Lighter, MD  sevelamer carbonate (RENVELA) 800 MG tablet Take 2 tablets (1,600 mg total) by mouth 3 (three) times daily with meals. 08/15/18   Mayo, Pete Pelt, MD  sucralfate (CARAFATE) 1 g tablet Take 1 tablet (1 g total) by mouth 4 (four) times daily. 05/29/16   Carrie Mew, MD  tamsulosin (FLOMAX) 0.4 MG CAPS capsule Take 1 capsule (0.4 mg total) by mouth daily. 09/27/16   Gladstone Lighter, MD  terazosin (HYTRIN) 5 MG capsule Take 1 capsule (5 mg total) by mouth at bedtime. 09/27/16   Gladstone Lighter, MD  traZODone (DESYREL) 100 MG tablet Take 100 mg by mouth at bedtime as needed for sleep.  10/22/15   [provider]    Allergies Cyclobenzaprine, Clonidine, Furosemide, and Tylenol [acetaminophen]  Family Hx History reviewed. No pertinent family history.  Social Hx Social History   Tobacco Use  . Smoking status: Current Every Day Smoker    Packs/day: 0.50    Types: Cigarettes  . Smokeless tobacco: Never Used  Substance Use Topics  . Alcohol use: No  . Drug use: No    Comment: pt has been positive for cocaine twice this week but denies use     Review of Systems  Constitutional: Negative for fever, chills. Eyes: Negative for visual changes. ENT: Negative for sore throat. Cardiovascular: + for chest pain. Respiratory: Negative for shortness of breath. Gastrointestinal: Negative for nausea, vomiting.  Genitourinary: Negative for dysuria. Musculoskeletal: Negative for leg swelling. Skin: Negative for rash. Neurological: Negative for for headaches.   Physical Exam  Vital Signs: ED Triage Vitals  Enc Vitals Group     BP 06/21/19 1425 (!) 180/110     Pulse Rate 06/21/19 1425 92      Resp 06/21/19 1425 (!) 22     Temp 06/21/19 1425 98 F (36.7 C)     Temp Source 06/21/19 1425 Oral     SpO2 06/21/19 1425 100 %     Weight 06/21/19 1436 248 lb (112.5 kg)     Height 06/21/19 1436 6' (1.829 m)     Head Circumference --      Peak Flow --      Pain Score 06/21/19 1426 8     Pain Loc --      Pain Edu? --      Excl. in [place]? --     Constitutional: Alert and oriented.  Head: Normocephalic. Atraumatic. Eyes: Conjunctivae clear. Sclera anicteric. Nose: No congestion. No rhinorrhea. Mouth/Throat: Wearing mask.  Neck: No stridor.   Cardiovascular: Normal rate, regular rhythm. Extremities well perfused. Dialysis fistula in LUE with palpable thrill.  Respiratory: Normal respiratory effort.  Lungs clear anteriorly. Satting 100% on  RA. Gastrointestinal: Soft. Non-tender. Non-distended.  Musculoskeletal: No lower extremity edema. No deformities. Neurologic:  Normal speech and language. No gross focal neurologic deficits are appreciated.  Skin: Skin is warm, dry and intact. No rash noted. Psychiatric: Mood and affect are appropriate for situation.  EKG  Personally reviewed.   EKG obtained 14:29.  Rate: 78 Rhythm: sinus Axis: borderline leftward Intervals: prolonged QTc No peaked T waves Significant baseline wander in multiple leads - II, III, aVR, aVL, aVF No STEMI  Repeat at 17:06 Rate: 77 Rhythm: sinus Axis: right, likely lead placement in I Intervals: prolonged QTc No peaked T waves No acute ischemic changes No STEMI   Radiology  CXR: IMPRESSION:  1. Mild cardiomegaly with mild central pulmonary venous congestion.  No overt edema or pleural effusions.    Procedures  Procedure(s) performed (including critical care):  Procedures   Initial Impression / Assessment and Plan / ED Course  63 y.o. male who presents to the ED for chest discomfort, dialysis. As above.   Ddx: ACS, symptomatic malignant HTN given BP elevated + chest pain, fluid  overload  Nephrology aware and will dialyze. EKG is without acute ischemic changes. HS troponin elevated at 145, though he is ESRD status. However, this is higher than he has been previously. Repeat troponin trending up at 152. Will discuss w/ hospitalist for admission. BP improved with pain medication. Not requiring supplemental oxygen at this time.    Final Clinical Impression(s) / ED Diagnosis  Final diagnoses:  ESRD on dialysis [facility])  Chest pain in adult    Note:  This document was prepared using Dragon voice recognition software and may include unintentional dictation errors.     Lilia Pro., MD 06/21/19 [phone]

## 2019-06-21 NOTE — Progress Notes (Signed)
[facility], [place] 06/21/19  Subjective:   LOS: 0 No intake/output data recorded. Urgency room for evaluation of chest pain and needing dialysis Last HD was many days ago.  Missed at least 4 treatments.  Seems like there is some misunderstanding regarding outpatient dialysis.  Patient states he went to his dialysis center to sign paperwork part was not dialyzed.  He is not clear about the reasons. At present, he does report some chest discomfort. Case discussed with ER physician, his EKG is without any acute changes BUN is 100, potassium is normal Patient reports he does have some extra fluid on him.  He does not know his dry weight  Objective:  Vital signs in last 24 hours:  Temp:  [98 F (36.7 C)] 98 F (36.7 C) (12/11 1425) Pulse Rate:  [92] 92 (12/11 1425) Resp:  [22] 22 (12/11 1425) BP: (180)/(110) 180/110 (12/11 1425) SpO2:  [100 %] 100 % (12/11 1425) Weight:  [112.5 kg] 112.5 kg (12/11 1436)  Weight change:  Filed Weights   06/21/19 1436  Weight: 112.5 kg    Intake/Output:   No intake or output data in the 24 hours ending 06/21/19 1740   Physical Exam: General:  No acute distress, laying in the bed  HEENT  anicteric, moist oral mucous membranes  Pulm/lungs  mild bilateral basilar crackles, room air  CVS/Heart  regular, no rub or gallop  Abdomen:   Soft, nontender  Extremities:  Trace edema  Neurologic:  Alert, oriented  Skin:  No acute rashes  Access:  Left arm AV fistula, good bruit and thrill       Basic Metabolic Panel:  Recent Labs  Lab 06/21/19 1437  NA 138  K 4.8  CL 103  CO2 19*  GLUCOSE 95  BUN 100*  CREATININE 14.14*  CALCIUM 7.6*     CBC: Recent Labs  Lab 06/21/19 1437  WBC 5.9  HGB 9.9*  HCT 27.9*  MCV 84.5  PLT 103*      Lab Results  Component Value Date   HEPBSAG Negative 11/15/2018   HEPBSAB Non Reactive 11/15/2018   HEPBIGM Negative 11/15/2018      Microbiology:  No results  found for this or any previous visit (from the past 240 hour(s)).  Coagulation Studies: Recent Labs    06/21/19 1437  LABPROT 15.2  INR 1.2    Urinalysis: No results for input(s): COLORURINE, LABSPEC, PHURINE, GLUCOSEU, HGBUR, BILIRUBINUR, KETONESUR, PROTEINUR, UROBILINOGEN, NITRITE, LEUKOCYTESUR in the last 72 hours.  Invalid input(s): APPERANCEUR    Imaging: DG Chest 2 View  Result Date: 06/21/2019 CLINICAL DATA:  Chest pain, shortness of breath, has not received dialysis in greater than 10 days EXAM: CHEST - 2 VIEW COMPARISON:  Radiograph Nov 13, 2018, FINDINGS: Slightly cephalized vascularity with some hazy interstitial change and fissural thickening. Central pulmonary venous congestion is noted. No consolidative opacity. No pneumothorax or visible effusion. Mild cardiomegaly is similar to priors. No acute osseous or soft tissue abnormality. Degenerative changes are present in the imaged spine and shoulders. IMPRESSION: 1. Mild cardiomegaly with mild central pulmonary venous congestion. No overt edema or pleural effusions. Electronically Signed   By: Lovena Le M.D.   On: 06/21/2019 15:17     Medications:    . [START ON 06/22/2019] Chlorhexidine Gluconate Cloth  6 each Topical Q0600     Assessment/ Plan:  63 y.o. male with history of depression, coronary disease, hypertension, pulmonary embolism, hepatitis C, history of substance abuse presents  with chest pain, malignant hypertension  Active Problems:   * No active hospital problems. *  #Malignant hypertension, chest pain and fluid overload -Patient has missed several dialysis treatments -Mildly elevated troponin, EKG unremarkable as per ER report -Patient will need reevaluation after dialysis and fluid removal -Home medications include amlodipine 10 mg daily, hydralazine 100 mg 3 times daily, Imdur 60 mg daily -No ACE inhibitor or ARB ? reason - may give PRN clonidine   #. ESRD -Patient has missed several dialysis  treatments -Care everywhere records state that he was set up for dialysis at [facility] [employer] on TTS shift.  At present the dialysis center is closed therefore we will have to investigate in the morning -Urgent dialysis today because of volume overload and uremia -Dialysis orders have been placed in case discussed with dialysis nurse.  #. Anemia of CKD  Lab Results  Component Value Date   HGB 9.9 (L) 06/21/2019   Low dose EPO with HD once BP is better controlled   #. SHPTH     Component Value Date/Time   PTH 758 (H) 08/15/2018 1117   Lab Results  Component Value Date   PHOS 4.8 (H) 11/16/2018   Monitor calcium and phos level during this admission    LOS: 0 Dorsie Sethi 12/11/20205:40 [address], [place]

## 2019-06-21 NOTE — Progress Notes (Signed)
Pre HD Assess   06/21/19 1815  Neurological  Level of Consciousness Alert  Orientation Level Oriented X4  Respiratory  Respiratory Pattern Regular  Chest Assessment Chest expansion symmetrical  Bilateral Breath Sounds Diminished  Cardiac  Pulse Regular  Heart Sounds S1, S2  Jugular Venous Distention (JVD) No  ECG Monitor Yes  Cardiac Rhythm SVT  Ectopy Multifocal PVC's  Vascular  R Radial Pulse +2  L Radial Pulse +2  Musculoskeletal  Musculoskeletal (WDL) WDL  GU Assessment  Genitourinary (WDL) WDL  Urine Characteristics  Urine Color Yellow/straw  Urine Appearance Clear  Psychosocial  Psychosocial (WDL) WDL

## 2019-06-21 NOTE — Progress Notes (Signed)
HD Initiated   06/21/19 1820  Vital Signs  Temp 97.8 F (36.6 C)  Temp Source Oral  Pulse Rate 75  Pulse Rate Source Dinamap  Resp (!) 23  BP (!) 171/108  BP Location Right Arm  BP Method Automatic  Patient Position (if appropriate) Sitting  During Hemodialysis Assessment  Blood Flow Rate (mL/min) 400 mL/min  Arterial Pressure (mmHg) -140 mmHg  Venous Pressure (mmHg) 90 mmHg  Transmembrane Pressure (mmHg) 60 mmHg  Ultrafiltration Rate (mL/min) 830 mL/min  Dialysate Flow Rate (mL/min) 600 ml/min  Conductivity: Machine  14.4  HD Safety Checks Performed Yes  Intra-Hemodialysis Comments Tx initiated

## 2019-06-21 NOTE — Progress Notes (Signed)
Pre HD   06/21/19 1820  Vital Signs  Temp 97.8 F (36.6 C)  Temp Source Oral  Pulse Rate 75  Pulse Rate Source Dinamap  Resp (!) 23  BP (!) 171/108  BP Location Right Arm  BP Method Automatic  Patient Position (if appropriate) Sitting  Oxygen Therapy  SpO2 99 %  O2 Device Room Air  Pain Assessment  Pain Scale 0-10  Pain Score 0  Dialysis Weight  Weight 112.5 kg  Type of Weight Pre-Dialysis  Time-Out for Hemodialysis  What Procedure? HD   Pt Identifiers(min of two) First/Last Name;MRN/Account#;Pt's DOB(use if MRN/Acct# not available  Correct Site? Yes  Correct Side? Yes  Correct Procedure? Yes  Consents Verified? Yes  Rad Studies Available? N/A  Safety Precautions Reviewed? Yes  Engineer, civil (consulting) Number 1  Station Number 1  UF/Alarm Test Passed  Conductivity: Meter 14  Conductivity: Machine  14.4  pH 7.2  Reverse Osmosis main  Normal Saline Lot Number [id number]  Dialyzer Lot Number [id number]  Disposable Set Lot Number [id number]  Machine Temperature 98.6 F (37 C)  Musician and Audible Yes  Blood Lines Intact and Secured Yes  Pre Treatment Patient Checks  Vascular access used during treatment Fistula  HD catheter dressing before treatment  (n/a)  Patient is receiving dialysis in a chair  (no)  Hepatitis B Surface Antigen Results Pending  Isolation Initiated Yes  Hepatitis B Surface Antibody  (unk)  Date Hepatitis B Surface Antibody Drawn 06/21/19  Hemodialysis Consent Verified Yes  Hemodialysis Standing Orders Initiated Yes  ECG (Telemetry) Monitor On Yes  Prime Ordered Normal Saline  Length of  DialysisTreatment -hour(s) 3 Hour(s)  Dialysis Treatment Comments  (Na 140)  Dialyzer Elisio 17H NR  Dialysate 2K;2.5 Ca  Dialysate Flow Ordered 600  Blood Flow Rate Ordered 400 mL/min  Ultrafiltration Goal 2 Liters  Dialysis Blood Pressure Support Ordered Normal Saline  Education / Care Plan  Dialysis Education Provided Yes  Documented  Education in Care Plan Yes  Outpatient Plan of Care Reviewed and on Chart Yes  Fistula / Graft Left Upper arm Arteriovenous fistula  No Placement Date or Time found.   Placed prior to admission: Yes  Orientation: Left  Access Location: Upper arm  Access Type: Arteriovenous fistula  Site Condition No complications  Fistula / Graft Assessment Bruit;Thrill;Present  Status Accessed  Needle Size 15  Drainage Description None

## 2019-06-21 NOTE — Progress Notes (Signed)
HD Complete   06/21/19 2130  Vital Signs  Temp 98 F (36.7 C)  Temp Source Oral  Pulse Rate 80  Pulse Rate Source Dinamap  Resp 20  BP (!) 192/111  BP Location Right Arm  BP Method Automatic  Patient Position (if appropriate) Lying  During Hemodialysis Assessment  HD Safety Checks Performed Yes  KECN 63.6 KECN  Dialysis Fluid Bolus Normal Saline  Bolus Amount (mL) 250 mL  Intra-Hemodialysis Comments Tx completed;Tolerated well

## 2019-06-21 NOTE — ED Triage Notes (Addendum)
Pt c/o chest pressure. Dialysis patient. Just moved here from [place] and has not had dialysis in 10 days or established care yet. Access left arm.

## 2019-06-21 NOTE — Progress Notes (Signed)
Post HD   06/21/19 2146  Vital Signs  Temp 98 F (36.7 C)  Temp Source Oral  Pulse Rate 84  Pulse Rate Source Dinamap  Resp (!) 22  BP (!) 185/106  BP Location Right Arm  BP Method Automatic  Patient Position (if appropriate) Lying  Oxygen Therapy  SpO2 98 %  O2 Device Room Air  Pain Assessment  Pain Scale 0-10  Pain Score 8  Post-Hemodialysis Assessment  Rinseback Volume (mL) 250 mL  KECN 63.6 V  Dialyzer Clearance Lightly streaked  Duration of HD Treatment -hour(s) 3 hour(s)  Hemodialysis Intake (mL) 500 mL  UF Total -Machine (mL) 2500 mL  Net UF (mL) 2000 mL  Tolerated HD Treatment Yes  AVG/AVF Arterial Site Held (minutes)  (15)  AVG/AVF Venous Site Held (minutes)  (15)

## 2019-06-21 NOTE — ED Notes (Signed)
. ED TO INPATIENT HANDOFF REPORT  ED Nurse Name and Phone #: Willene Hatchet Name/Age/Gender [name] 63 y.o. male Room/Bed: ED03A/ED03A  Code Status   Code Status: Prior  Home/SNF/Other Home Patient oriented to: self, place, time and situation Is this baseline? Yes   Triage Complete: Triage complete  Chief Complaint Fluid overload [E87.70]  Triage Note Pt c/o chest pressure. Dialysis patient. Just moved here from [place] and has not had dialysis in 10 days or established care yet. Access left arm.     Allergies Allergies  Allergen Reactions  . Cyclobenzaprine Nausea Only  . Clonidine Other (See Comments)    Asymptomatic bradycardia to 38  . Furosemide Other (See Comments)    Caused renal failure  . Tylenol [Acetaminophen] Other (See Comments)    Reaction:  Pt states it bothers his liver    Level of Care/Admitting Diagnosis ED Disposition    ED Disposition Condition [facility] Area: [place] [100120]  Level of Care: Telemetry [5]  Covid Evaluation: Asymptomatic Screening Protocol (No Symptoms)  Diagnosis: Fluid overload [id number]  Admitting Physician: Eston Esters  Attending Physician: Eston Esters       B Medical/Surgery History Past Medical History:  Diagnosis Date  . Depression   . Heart attack ([place])   . Hypertension   . PE (pulmonary embolism)   . Renal disorder    Past Surgical History:  Procedure Laterality Date  . TOTAL HIP ARTHROPLASTY       A IV Location/Drains/Wounds Patient Lines/Drains/Airways Status   Active Line/Drains/Airways    Name:   Placement date:   Placement time:   Site:   Days:   Peripheral IV 06/21/19 Right Forearm   06/21/19    1713    Forearm   less than 1   Fistula / Graft Left Upper arm Arteriovenous fistula   --    --    Upper arm             Intake/Output Last 24 hours  Intake/Output Summary (Last 24 hours) at 06/21/2019 2321 Last data  filed at 06/21/2019 2146 Gross per 24 hour  Intake --  Output 2100 ml  Net -2100 ml    Labs/Imaging Results for orders placed or performed during the hospital encounter of 06/21/19 (from the past 48 hour(s))  Basic metabolic panel     Status: Abnormal   Collection Time: 06/21/19  2:37 PM  Result Value Ref Range   Sodium 138 135 - 145 mmol/L   Potassium 4.8 3.5 - 5.1 mmol/L   Chloride 103 98 - 111 mmol/L   CO2 19 (L) 22 - 32 mmol/L   Glucose, Bld 95 70 - 99 mg/dL   BUN 100 (H) 8 - 23 mg/dL   Creatinine, Ser 14.14 (H) 0.61 - 1.24 mg/dL   Calcium 7.6 (L) 8.9 - 10.3 mg/dL   GFR calc non Af Amer 3 (L) >60 mL/min   GFR calc Af Amer 4 (L) >60 mL/min   Anion gap 16 (H) 5 - 15    Comment: Performed at [facility], [address] [place], [place] [address]  CBC     Status: Abnormal   Collection Time: 06/21/19  2:37 PM  Result Value Ref Range   WBC 5.9 4.0 - 10.5 K/uL   RBC 3.30 (L) 4.22 - 5.81 MIL/uL   Hemoglobin 9.9 (L) 13.0 - 17.0 g/dL   HCT 27.9 (L) 39.0 -  52.0 %   MCV 84.5 80.0 - 100.0 fL   MCH 30.0 26.0 - 34.0 pg   MCHC 35.5 30.0 - 36.0 g/dL   RDW 16.0 (H) 11.5 - 15.5 %   Platelets 103 (L) 150 - 400 K/uL    Comment: Immature Platelet Fraction may be clinically indicated, consider ordering this additional test [id number]    nRBC 0.0 0.0 - 0.2 %    Comment: Performed at [facility], [address] [place], [place] [address]  Troponin I (High Sensitivity)     Status: Abnormal   Collection Time: 06/21/19  2:37 PM  Result Value Ref Range   Troponin I (High Sensitivity) 145 (HH) <18 ng/L    Comment: CRITICAL RESULT CALLED TO, READ BACK BY AND VERIFIED WITH BRANDI DAVIS 06/21/2019 AT 1514 HS/PMF (NOTE) Elevated high sensitivity troponin I (hsTnI) values and significant  changes across serial measurements may suggest ACS but many other  chronic and acute conditions are known to elevate hsTnI results.  Refer to the "Links" section for chest pain algorithms  and additional  guidance. Performed at [facility], [address] [place], [place] [address]   Protime-INR (order if Patient is taking Coumadin / Warfarin)     Status: None   Collection Time: 06/21/19  2:37 PM  Result Value Ref Range   Prothrombin Time 15.2 11.4 - 15.2 seconds   INR 1.2 0.8 - 1.2    Comment: (NOTE) INR goal varies based on device and disease states. Performed at [facility], [address] [place], [place] [address]   Troponin I (High Sensitivity)     Status: Abnormal   Collection Time: 06/21/19  5:11 PM  Result Value Ref Range   Troponin I (High Sensitivity) 152 (HH) <18 ng/L    Comment: CRITICAL VALUE NOTED. VALUE IS CONSISTENT WITH PREVIOUSLY REPORTED/CALLED VALUE / [place] (NOTE) Elevated high sensitivity troponin I (hsTnI) values and significant  changes across serial measurements may suggest ACS but many other  chronic and acute conditions are known to elevate hsTnI results.  Refer to the "Links" section for chest pain algorithms and additional  guidance. Performed at [facility], [address] [place], [place] [address]   Magnesium     Status: Abnormal   Collection Time: 06/21/19  5:11 PM  Result Value Ref Range   Magnesium 2.5 (H) 1.7 - 2.4 mg/dL    Comment: Performed at [facility], [address] [place], [place] [address]  Phosphorus     Status: Abnormal   Collection Time: 06/21/19  5:11 PM  Result Value Ref Range   Phosphorus 9.0 (H) 2.5 - 4.6 mg/dL    Comment: Performed at [facility], [address] [place], [place] [address]   DG Chest 2 View  Result Date: 06/21/2019 CLINICAL DATA:  Chest pain, shortness of breath, has not received dialysis in greater than 10 days EXAM: CHEST - 2 VIEW COMPARISON:  Radiograph Nov 13, 2018, FINDINGS: Slightly cephalized vascularity with some hazy interstitial change and fissural thickening. Central pulmonary venous congestion is noted. No consolidative  opacity. No pneumothorax or visible effusion. Mild cardiomegaly is similar to priors. No acute osseous or soft tissue abnormality. Degenerative changes are present in the imaged spine and shoulders. IMPRESSION: 1. Mild cardiomegaly with mild central pulmonary venous congestion. No overt edema or pleural effusions. Electronically Signed   By: Lovena Le M.D.   On: 06/21/2019 15:17    Pending Labs Unresulted Labs (From admission,  onward)    Start     Ordered   06/21/19 1831  SARS CORONAVIRUS 2 (TAT 6-24 HRS) Nasopharyngeal Nasopharyngeal Swab  (Tier 3 (TAT 6-24 hrs))  ONCE - STAT,   STAT    Question Answer Comment  Is this test for diagnosis or screening Screening   Symptomatic for COVID-19 as defined by CDC No   Hospitalized for COVID-19 No   Admitted to ICU for COVID-19 No   Previously tested for COVID-19 Yes   Resident in a congregate (group) care setting No   Employed in healthcare setting No      06/21/19 1830   06/21/19 1709  Hepatitis B surface antigen  Add-on,   AD     06/21/19 1708   Signed and Held  Occult blood card to lab, stool  ONCE - STAT,   R     Signed and Held   Signed and Held  Urine Drug Screen, Qualitative ([place] only)  Once,   R     Signed and Held   Signed and Held  CBC  Tomorrow morning,   R    Question:  Specimen collection method  Answer:  Unit=Unit collect   Signed and Held   Signed and Held  Comprehensive metabolic panel  Tomorrow morning,   STAT    Question:  Specimen collection method  Answer:  Unit=Unit collect   Signed and Held          Vitals/Pain Today's Vitals   06/21/19 2100 06/21/19 2117 06/21/19 2130 06/21/19 2146  BP: (!) 176/97 (!) 180/103 (!) 192/111 (!) (P) 185/106  Pulse: 72 78 80 (P) 84  Resp: (!) 27 (!) 24 20 (!) (P) 22  Temp:   98 F (36.7 C) (P) 98 F (36.7 C)  TempSrc:   Oral (P) Oral  SpO2:    (P) 98%  Weight:      Height:      PainSc:    (P) 8     Isolation Precautions No active isolations  Medications Medications   Chlorhexidine Gluconate Cloth 2 % PADS 6 each (has no administration in time range)  morphine 4 MG/ML injection 4 mg (4 mg Intravenous Given 06/21/19 1715)    Mobility walks Moderate fall risk   Focused Assessments Renal Assessment Handoff:  Hemodialysis Schedule: Hemodialysis Schedule: Monday/Wednesday/Friday Last Hemodialysis date and time: today        R Recommendations: See Admitting Provider Note  Report given to:   Additional Notes:

## 2019-06-22 ENCOUNTER — Encounter: Payer: Self-pay | Admitting: Obstetrics and Gynecology

## 2019-06-22 DIAGNOSIS — F329 Major depressive disorder, single episode, unspecified: Secondary | ICD-10-CM | POA: Diagnosis present

## 2019-06-22 DIAGNOSIS — F1490 Cocaine use, unspecified, uncomplicated: Secondary | ICD-10-CM | POA: Diagnosis present

## 2019-06-22 DIAGNOSIS — E8779 Other fluid overload: Secondary | ICD-10-CM | POA: Diagnosis not present

## 2019-06-22 DIAGNOSIS — F1120 Opioid dependence, uncomplicated: Secondary | ICD-10-CM | POA: Diagnosis present

## 2019-06-22 DIAGNOSIS — E785 Hyperlipidemia, unspecified: Secondary | ICD-10-CM | POA: Diagnosis present

## 2019-06-22 DIAGNOSIS — K7402 Hepatic fibrosis, advanced fibrosis: Secondary | ICD-10-CM | POA: Diagnosis present

## 2019-06-22 DIAGNOSIS — N186 End stage renal disease: Secondary | ICD-10-CM | POA: Diagnosis present

## 2019-06-22 DIAGNOSIS — I160 Hypertensive urgency: Secondary | ICD-10-CM | POA: Diagnosis present

## 2019-06-22 DIAGNOSIS — N2581 Secondary hyperparathyroidism of renal origin: Secondary | ICD-10-CM | POA: Diagnosis present

## 2019-06-22 DIAGNOSIS — I120 Hypertensive chronic kidney disease with stage 5 chronic kidney disease or end stage renal disease: Secondary | ICD-10-CM | POA: Diagnosis present

## 2019-06-22 DIAGNOSIS — F17210 Nicotine dependence, cigarettes, uncomplicated: Secondary | ICD-10-CM | POA: Diagnosis present

## 2019-06-22 DIAGNOSIS — I2510 Atherosclerotic heart disease of native coronary artery without angina pectoris: Secondary | ICD-10-CM | POA: Diagnosis present

## 2019-06-22 DIAGNOSIS — G8929 Other chronic pain: Secondary | ICD-10-CM | POA: Diagnosis present

## 2019-06-22 DIAGNOSIS — E8770 Fluid overload, unspecified: Secondary | ICD-10-CM | POA: Diagnosis present

## 2019-06-22 DIAGNOSIS — F1994 Other psychoactive substance use, unspecified with psychoactive substance-induced mood disorder: Secondary | ICD-10-CM | POA: Diagnosis present

## 2019-06-22 DIAGNOSIS — Z992 Dependence on renal dialysis: Secondary | ICD-10-CM

## 2019-06-22 DIAGNOSIS — G4700 Insomnia, unspecified: Secondary | ICD-10-CM | POA: Diagnosis present

## 2019-06-22 DIAGNOSIS — D696 Thrombocytopenia, unspecified: Secondary | ICD-10-CM | POA: Diagnosis present

## 2019-06-22 DIAGNOSIS — F602 Antisocial personality disorder: Secondary | ICD-10-CM | POA: Diagnosis present

## 2019-06-22 DIAGNOSIS — Z20828 Contact with and (suspected) exposure to other viral communicable diseases: Secondary | ICD-10-CM | POA: Diagnosis present

## 2019-06-22 DIAGNOSIS — D631 Anemia in chronic kidney disease: Secondary | ICD-10-CM | POA: Diagnosis present

## 2019-06-22 DIAGNOSIS — I252 Old myocardial infarction: Secondary | ICD-10-CM | POA: Diagnosis not present

## 2019-06-22 DIAGNOSIS — N400 Enlarged prostate without lower urinary tract symptoms: Secondary | ICD-10-CM | POA: Diagnosis present

## 2019-06-22 DIAGNOSIS — I151 Hypertension secondary to other renal disorders: Secondary | ICD-10-CM | POA: Diagnosis not present

## 2019-06-22 DIAGNOSIS — R112 Nausea with vomiting, unspecified: Secondary | ICD-10-CM | POA: Diagnosis present

## 2019-06-22 DIAGNOSIS — R0789 Other chest pain: Secondary | ICD-10-CM | POA: Diagnosis present

## 2019-06-22 LAB — URINE DRUG SCREEN, QUALITATIVE (ARMC ONLY)
Amphetamines, Ur Screen: NOT DETECTED
Barbiturates, Ur Screen: NOT DETECTED
Benzodiazepine, Ur Scrn: NOT DETECTED
Cannabinoid 50 Ng, Ur ~~LOC~~: NOT DETECTED
Cocaine Metabolite,Ur ~~LOC~~: POSITIVE — AB
MDMA (Ecstasy)Ur Screen: NOT DETECTED
Methadone Scn, Ur: NOT DETECTED
Opiate, Ur Screen: NOT DETECTED
Phencyclidine (PCP) Ur S: NOT DETECTED
Tricyclic, Ur Screen: NOT DETECTED

## 2019-06-22 LAB — CBC
HCT: 26.8 % — ABNORMAL LOW (ref 39.0–52.0)
Hemoglobin: 9.5 g/dL — ABNORMAL LOW (ref 13.0–17.0)
MCH: 29.7 pg (ref 26.0–34.0)
MCHC: 35.4 g/dL (ref 30.0–36.0)
MCV: 83.8 fL (ref 80.0–100.0)
Platelets: 87 10*3/uL — ABNORMAL LOW (ref 150–400)
RBC: 3.2 MIL/uL — ABNORMAL LOW (ref 4.22–5.81)
RDW: 15.9 % — ABNORMAL HIGH (ref 11.5–15.5)
WBC: 5.1 10*3/uL (ref 4.0–10.5)
nRBC: 0 % (ref 0.0–0.2)

## 2019-06-22 LAB — MRSA PCR SCREENING: MRSA by PCR: NEGATIVE

## 2019-06-22 LAB — COMPREHENSIVE METABOLIC PANEL
ALT: 15 U/L (ref 0–44)
AST: 14 U/L — ABNORMAL LOW (ref 15–41)
Albumin: 3.3 g/dL — ABNORMAL LOW (ref 3.5–5.0)
Alkaline Phosphatase: 41 U/L (ref 38–126)
Anion gap: 11 (ref 5–15)
BUN: 53 mg/dL — ABNORMAL HIGH (ref 8–23)
CO2: 31 mmol/L (ref 22–32)
Calcium: 7.7 mg/dL — ABNORMAL LOW (ref 8.9–10.3)
Chloride: 100 mmol/L (ref 98–111)
Creatinine, Ser: 8.64 mg/dL — ABNORMAL HIGH (ref 0.61–1.24)
GFR calc Af Amer: 7 mL/min — ABNORMAL LOW (ref >60)
GFR calc non Af Amer: 6 mL/min — ABNORMAL LOW (ref >60)
Glucose, Bld: 96 mg/dL (ref 70–99)
Potassium: 3.5 mmol/L (ref 3.5–5.1)
Sodium: 142 mmol/L (ref 135–145)
Total Bilirubin: 1.1 mg/dL (ref 0.3–1.2)
Total Protein: 6 g/dL — ABNORMAL LOW (ref 6.5–8.1)

## 2019-06-22 LAB — HEPATITIS B SURFACE ANTIGEN: Hepatitis B Surface Ag: NONREACTIVE

## 2019-06-22 LAB — TROPONIN I (HIGH SENSITIVITY): Troponin I (High Sensitivity): 132 ng/L (ref <18)

## 2019-06-22 LAB — SARS CORONAVIRUS 2 (TAT 6-24 HRS): SARS Coronavirus 2: NEGATIVE

## 2019-06-22 MED ORDER — OXYCODONE HCL 5 MG PO TABS
5.0000 mg | ORAL_TABLET | Freq: Four times a day (QID) | ORAL | Status: DC | PRN
  Administered 2019-06-22 – 2019-06-23 (×3): 5 mg via ORAL
  Filled 2019-06-22 (×3): qty 1

## 2019-06-22 MED ORDER — HEPARIN SODIUM (PORCINE) 1000 UNIT/ML DIALYSIS
20.0000 [IU]/kg | INTRAMUSCULAR | Status: DC | PRN
  Filled 2019-06-22: qty 3

## 2019-06-22 MED ORDER — IRBESARTAN 75 MG PO TABS
75.0000 mg | ORAL_TABLET | Freq: Every day | ORAL | Status: DC
  Administered 2019-06-22: 75 mg via ORAL
  Filled 2019-06-22 (×2): qty 1

## 2019-06-22 MED ORDER — PROMETHAZINE HCL 25 MG/ML IJ SOLN
12.5000 mg | Freq: Three times a day (TID) | INTRAMUSCULAR | Status: DC | PRN
  Administered 2019-06-22: 12.5 mg via INTRAVENOUS
  Filled 2019-06-22: qty 1

## 2019-06-22 MED ORDER — EPOETIN ALFA 10000 UNIT/ML IJ SOLN: 4000.0000 [IU] | INTRAMUSCULAR | Status: DC

## 2019-06-22 MED ORDER — HEPARIN SODIUM (PORCINE) 1000 UNIT/ML DIALYSIS
1000.0000 [IU] | INTRAMUSCULAR | Status: DC | PRN
  Filled 2019-06-22: qty 1

## 2019-06-22 MED ORDER — OXYCODONE HCL ER 10 MG PO T12A
20.0000 mg | EXTENDED_RELEASE_TABLET | Freq: Two times a day (BID) | ORAL | Status: DC
  Administered 2019-06-22 – 2019-06-23 (×3): 20 mg via ORAL
  Filled 2019-06-22 (×3): qty 2

## 2019-06-22 NOTE — Progress Notes (Signed)
Progress Note    LOWRY BENTHALL  [id number] DOB: June 16, 1956  DOA: 06/21/2019 PCP: Patient, No Pcp Per         Assessment/Plan:   Principal Problem:   Volume overload Active Problems:   Hypertension   Substance induced mood disorder ([place])   Antisocial personality disorder ([place])   ESRD on dialysis ([place])   Fluid overload   Body mass index is 33.19 kg/m.    End-stage renal disease with fluid overload: Follow-up with nephrologist for hemodialysis.  Hypertensive urgency: BP has improved.  Continue antihypertensives  Chest pain: He attributes this to fluid overload.  Monitor overnight in the hospital.  History of PE: Continue Eliquis  Substance abuse/cocaine abuse: Counseled to quit.  Liver fibrosis and thrombocytopenia: No acute issues.  BPH: Continue terazosin and Flomax    Family Communication/Anticipated D/C date and plan/Code Status   DVT prophylaxis: Eliquis Code Status: Full code Family Communication: Plan discussed with the patient  disposition Plan: Possible discharge to home tomorrow      Subjective:   C/o chest pain and he thinks it's from too much fluid.  He does not feel comfortable going home today.  Objective:    Vitals:   06/22/19 1715 06/22/19 1730 06/22/19 1745 06/22/19 1800  BP: (!) 151/82 (!) 159/101    Pulse: (!) 30 78 85 86  Resp: 18 16 17 16   Temp:      TempSrc:      SpO2:      Weight:      Height:        Intake/Output Summary (Last 24 hours) at 06/22/2019 1813 Last data filed at 06/22/2019 1132 Gross per 24 hour  Intake --  Output 3050 ml  Net -3050 ml   Filed Weights   06/21/19 1820 06/22/19 1407 06/22/19 1545  Weight: 112.5 kg 110.9 kg 111 kg    Exam:  GEN: NAD SKIN: No rash EYES: EOMI ENT: MMM CV: RRR PULM: CTA B ABD: soft, ND, NT, +BS CNS: AAO x 3, non focal EXT: No edema or tenderness. Left arm AVF with bruit     Data Reviewed:   I have personally reviewed following labs and  imaging studies:  Labs: Labs show the following:   Basic Metabolic Panel: Recent Labs  Lab 06/21/19 1437 06/21/19 1711 06/22/19 0545  NA 138  --  142  K 4.8  --  3.5  CL 103  --  100  CO2 19*  --  31  GLUCOSE 95  --  96  BUN 100*  --  53*  CREATININE 14.14*  --  8.64*  CALCIUM 7.6*  --  7.7*  MG  --  2.5*  --   PHOS  --  9.0*  --    GFR Estimated Creatinine Clearance: 11.6 mL/min (A) (by C-G formula based on SCr of 8.64 mg/dL (H)). Liver Function Tests: Recent Labs  Lab 06/22/19 0545  AST 14*  ALT 15  ALKPHOS 41  BILITOT 1.1  PROT 6.0*  ALBUMIN 3.3*   No results for input(s): LIPASE, AMYLASE in the last 168 hours. No results for input(s): AMMONIA in the last 168 hours. Coagulation profile Recent Labs  Lab 06/21/19 1437  INR 1.2    CBC: Recent Labs  Lab 06/21/19 1437 06/22/19 0545  WBC 5.9 5.1  HGB 9.9* 9.5*  HCT 27.9* 26.8*  MCV 84.5 83.8  PLT 103* 87*   Cardiac Enzymes: No results for input(s): CKTOTAL, CKMB, CKMBINDEX, TROPONINI in  the last 168 hours. BNP (last 3 results) No results for input(s): PROBNP in the last 8760 hours. CBG: No results for input(s): GLUCAP in the last 168 hours. D-Dimer: No results for input(s): DDIMER in the last 72 hours. Hgb A1c: No results for input(s): HGBA1C in the last 72 hours. Lipid Profile: No results for input(s): CHOL, HDL, LDLCALC, TRIG, CHOLHDL, LDLDIRECT in the last 72 hours. Thyroid function studies: No results for input(s): TSH, T4TOTAL, T3FREE, THYROIDAB in the last 72 hours.  Invalid input(s): FREET3 Anemia work up: No results for input(s): VITAMINB12, FOLATE, FERRITIN, TIBC, IRON, RETICCTPCT in the last 72 hours. Sepsis Labs: Recent Labs  Lab 06/21/19 1437 06/22/19 0545  WBC 5.9 5.1    Microbiology Recent Results (from the past 240 hour(s))  SARS CORONAVIRUS 2 (TAT 6-24 HRS) Nasopharyngeal Nasopharyngeal Swab     Status: None   Collection Time: 06/21/19 11:24 PM   Specimen:  Nasopharyngeal Swab  Result Value Ref Range Status   SARS Coronavirus 2 NEGATIVE NEGATIVE Final    Comment: (NOTE) SARS-CoV-2 target nucleic acids are NOT DETECTED. The SARS-CoV-2 RNA is generally detectable in upper and lower respiratory specimens during the acute phase of infection. Negative results do not preclude SARS-CoV-2 infection, do not rule out co-infections with other pathogens, and should not be used as the sole basis for treatment or other patient management decisions. Negative results must be combined with clinical observations, patient history, and epidemiological information. The expected result is Negative. Fact Sheet for Patients: SugarRoll.be Fact Sheet for Healthcare Providers: https://www.woods-mathews.com/ This test is not yet approved or cleared by the [place] FDA and  has been authorized for detection and/or diagnosis of SARS-CoV-2 by FDA under an Emergency Use Authorization (EUA). This EUA will remain  in effect (meaning this test can be used) for the duration of the COVID-19 declaration under Section 56 4(b)(1) of the Act, 21 U.S.C. section 360bbb-3(b)(1), unless the authorization is terminated or revoked sooner. Performed at [facility], [place] [address], [place], [place] [address]     Procedures and diagnostic studies:  DG Chest 2 View  Result Date: 06/21/2019 CLINICAL DATA:  Chest pain, shortness of breath, has not received dialysis in greater than 10 days EXAM: CHEST - 2 VIEW COMPARISON:  Radiograph Nov 13, 2018, FINDINGS: Slightly cephalized vascularity with some hazy interstitial change and fissural thickening. Central pulmonary venous congestion is noted. No consolidative opacity. No pneumothorax or visible effusion. Mild cardiomegaly is similar to priors. No acute osseous or soft tissue abnormality. Degenerative changes are present in the imaged spine and shoulders. IMPRESSION: 1. Mild cardiomegaly  with mild central pulmonary venous congestion. No overt edema or pleural effusions. Electronically Signed   By: Lovena Le M.D.   On: 06/21/2019 15:17    Medications:   . amLODipine  10 mg Oral Daily  . apixaban  2.5 mg Oral BID  . aspirin EC  81 mg Oral Daily  . atorvastatin  40 mg Oral QHS  . Chlorhexidine Gluconate Cloth  6 each Topical Q0600  . docusate sodium  100 mg Oral BID  . [START ON 06/25/2019] epoetin (EPOGEN/PROCRIT) injection  4,000 Units Intravenous Q T,Th,Sa-HD  . hydrALAZINE  100 mg Oral TID  . irbesartan  75 mg Oral QHS  . isosorbide mononitrate  60 mg Oral Daily  . nicotine  14 mg Transdermal Daily  . oxyCODONE  20 mg Oral Q12H  . sevelamer carbonate  1,600 mg Oral TID WC  . tamsulosin  0.4 mg Oral Daily  . terazosin  5 mg Oral QHS   Continuous Infusions:   LOS: 0 days   Farouk Vivero  [facility]   *Please refer to [email or web address], password [email or web address] to get updated schedule on who will round on this patient, as hospitalists switch teams weekly. If 7PM-7AM, please contact night-coverage at [email or web address], password [email or web address] for any overnight needs.  06/22/2019, 6:13 PM

## 2019-06-22 NOTE — Progress Notes (Addendum)
Pt telling me, "Back home I don't go to dialysis like I'm supposed to. I just go when I want. Sometimes I wait 1-2 months to go to dialysis. I just get tired of going."   Pt also tests positive for cocaine.  Dr. Mal Misty notifed of above.

## 2019-06-22 NOTE — Progress Notes (Signed)
This note also relates to the following rows which could not be included: Pulse Rate - Cannot attach notes to unvalidated device data Resp - Cannot attach notes to unvalidated device data BP - Cannot attach notes to unvalidated device data  tx started

## 2019-06-22 NOTE — Progress Notes (Signed)
[facility], [place] 06/22/19  Subjective:   LOS: 0 12/11 0701 - 12/12 0700 In: -  Out: 2100 [Urine:100] Patient presented to the emergency room after missing at least for dialysis treatment. He complained of chest pain yesterday. 2000 cc of fluid was removed with dialysis yesterday Today, still continues to have some chest pain Reports vomiting and unable to keep food down Work-up is in progress   HEMODIALYSIS FLOWSHEET:  Blood Flow Rate (mL/min): 350 mL/min Arterial Pressure (mmHg): -130 mmHg Venous Pressure (mmHg): 150 mmHg Transmembrane Pressure (mmHg): 70 mmHg Ultrafiltration Rate (mL/min): 830 mL/min Dialysate Flow Rate (mL/min): 600 ml/min Conductivity: Machine : 14.1 Conductivity: Machine : 14.1 Dialysis Fluid Bolus: Normal Saline Bolus Amount (mL): 250 mL      Objective:  Vital signs in last 24 hours:  Temp:  [97.7 F (36.5 C)-98.3 F (36.8 C)] 97.7 F (36.5 C) (12/12 1545) Pulse Rate:  [60-104] 83 (12/12 1545) Resp:  [18-30] 22 (12/12 1545) BP: (147-192)/(85-142) 147/85 (12/12 1545) SpO2:  [94 %-99 %] 94 % (12/12 1407) Weight:  [110.9 kg-112.5 kg] (P) 111 kg (12/12 1545)  Weight change:  Filed Weights   06/21/19 1820 06/22/19 1407 06/22/19 1545  Weight: 112.5 kg 110.9 kg (P) 111 kg    Intake/Output:    Intake/Output Summary (Last 24 hours) at 06/22/2019 1601 Last data filed at 06/22/2019 1132 Gross per 24 hour  Intake -  Output 3050 ml  Net -3050 ml     Physical Exam: General:  No acute distress, laying in the bed  HEENT  anicteric, moist oral mucous membranes  Pulm/lungs  mild bilateral basilar crackles, room air  CVS/Heart  irregular, no rub or gallop  Abdomen:   Soft, mild non-localized tenderness  Extremities:  Trace edema  Neurologic:  Alert, oriented  Skin:  No acute rashes  Access:  Left arm AV fistula, good bruit and thrill       Basic Metabolic Panel:  Recent Labs  Lab 06/21/19 1437  06/21/19 1711 06/22/19 0545  NA 138  --  142  K 4.8  --  3.5  CL 103  --  100  CO2 19*  --  31  GLUCOSE 95  --  96  BUN 100*  --  53*  CREATININE 14.14*  --  8.64*  CALCIUM 7.6*  --  7.7*  MG  --  2.5*  --   PHOS  --  9.0*  --      CBC: Recent Labs  Lab 06/21/19 1437 06/22/19 0545  WBC 5.9 5.1  HGB 9.9* 9.5*  HCT 27.9* 26.8*  MCV 84.5 83.8  PLT 103* 87*      Lab Results  Component Value Date   HEPBSAG NON REACTIVE 06/21/2019   HEPBSAB Non Reactive 11/15/2018   HEPBIGM Negative 11/15/2018      Microbiology:  Recent Results (from the past 240 hour(s))  SARS CORONAVIRUS 2 (TAT 6-24 HRS) Nasopharyngeal Nasopharyngeal Swab     Status: None   Collection Time: 06/21/19 11:24 PM   Specimen: Nasopharyngeal Swab  Result Value Ref Range Status   SARS Coronavirus 2 NEGATIVE NEGATIVE Final    Comment: (NOTE) SARS-CoV-2 target nucleic acids are NOT DETECTED. The SARS-CoV-2 RNA is generally detectable in upper and lower respiratory specimens during the acute phase of infection. Negative results do not preclude SARS-CoV-2 infection, do not rule out co-infections with other pathogens, and should not be used as the sole basis for treatment or other patient management decisions.  Negative results must be combined with clinical observations, patient history, and epidemiological information. The expected result is Negative. Fact Sheet for Patients: SugarRoll.be Fact Sheet for Healthcare Providers: https://www.woods-mathews.com/ This test is not yet approved or cleared by the [place] FDA and  has been authorized for detection and/or diagnosis of SARS-CoV-2 by FDA under an Emergency Use Authorization (EUA). This EUA will remain  in effect (meaning this test can be used) for the duration of the COVID-19 declaration under Section 56 4(b)(1) of the Act, 21 U.S.C. section 360bbb-3(b)(1), unless the authorization is terminated  or revoked sooner. Performed at [facility], [place] [address] [place], [place] [address]     Coagulation Studies: Recent Labs    06/21/19 1437  LABPROT 15.2  INR 1.2    Urinalysis: No results for input(s): COLORURINE, LABSPEC, PHURINE, GLUCOSEU, HGBUR, BILIRUBINUR, KETONESUR, PROTEINUR, UROBILINOGEN, NITRITE, LEUKOCYTESUR in the last 72 hours.  Invalid input(s): APPERANCEUR    Imaging: DG Chest 2 View  Result Date: 06/21/2019 CLINICAL DATA:  Chest pain, shortness of breath, has not received dialysis in greater than 10 days EXAM: CHEST - 2 VIEW COMPARISON:  Radiograph Nov 13, 2018, FINDINGS: Slightly cephalized vascularity with some hazy interstitial change and fissural thickening. Central pulmonary venous congestion is noted. No consolidative opacity. No pneumothorax or visible effusion. Mild cardiomegaly is similar to priors. No acute osseous or soft tissue abnormality. Degenerative changes are present in the imaged spine and shoulders. IMPRESSION: 1. Mild cardiomegaly with mild central pulmonary venous congestion. No overt edema or pleural effusions. Electronically Signed   By: Lovena Le M.D.   On: 06/21/2019 15:17     Medications:    . amLODipine  10 mg Oral Daily  . apixaban  2.5 mg Oral BID  . aspirin EC  81 mg Oral Daily  . atorvastatin  40 mg Oral QHS  . Chlorhexidine Gluconate Cloth  6 each Topical Q0600  . docusate sodium  100 mg Oral BID  . hydrALAZINE  100 mg Oral TID  . isosorbide mononitrate  60 mg Oral Daily  . nicotine  14 mg Transdermal Daily  . oxyCODONE  20 mg Oral Q12H  . sevelamer carbonate  1,600 mg Oral TID WC  . tamsulosin  0.4 mg Oral Daily  . terazosin  5 mg Oral QHS     Assessment/ Plan:  63 y.o. male with history of depression, coronary disease, hypertension, pulmonary embolism, hepatitis C, history of substance abuse presents with chest pain, malignant hypertension  Principal Problem:   Volume overload Active Problems:    Hypertension   Substance induced mood disorder ([facility])   Antisocial personality disorder ([place])   ESRD on dialysis ([facility])   Fluid overload  #Malignant hypertension, chest pain and fluid overload - BP better controlled - start low dose irbesartan at night (patient denies any allergies) - may give PRN clonidine   #. ESRD -confirmed with outpatient center at [facility] kidney center that he is scheduled for TTS dialysis - HD today to address uremia Next HD on tuesday   #. Anemia of CKD  Lab Results  Component Value Date   HGB 9.5 (L) 06/22/2019   Low dose EPO with HD once BP is better controlled   #. SHPTH     Component Value Date/Time   PTH 758 (H) 08/15/2018 1117   Lab Results  Component Value Date   PHOS 9.0 (H) 06/21/2019   Monitor calcium and phos level during this admission Will need sensipar as outpatient  LOS: 0 Sanyah Molnar 12/12/20204:01 PM  [place], [place]

## 2019-06-22 NOTE — Progress Notes (Signed)
This note also relates to the following rows which could not be included: Pulse Rate - Cannot attach notes to unvalidated device data Resp - Cannot attach notes to unvalidated device data BP - Cannot attach notes to unvalidated device data  Hd completed  

## 2019-06-22 NOTE — Progress Notes (Signed)
Hd completed 

## 2019-06-22 NOTE — ED Notes (Signed)
Pt given meal tray and water to drink.  

## 2019-06-22 NOTE — ED Notes (Signed)
Pt placed in hospital bed by this RN, no c/o pain at this time. Oriented to room and call bell. NAD.

## 2019-06-22 NOTE — ED Notes (Signed)
Pt given a cup of ginger ale.

## 2019-06-22 NOTE — Progress Notes (Signed)
I just noticed a fentanyl patch on pt's right arm and asked the patient about it and he pulled it off and said he forgot about it and that he put that on about 10 days ago. Patch disposed of properly and doctor [place] notified.

## 2019-06-23 DIAGNOSIS — E8770 Fluid overload, unspecified: Principal | ICD-10-CM

## 2019-06-23 DIAGNOSIS — N2889 Other specified disorders of kidney and ureter: Secondary | ICD-10-CM

## 2019-06-23 DIAGNOSIS — I151 Hypertension secondary to other renal disorders: Secondary | ICD-10-CM

## 2019-06-23 MED ORDER — SEVELAMER CARBONATE 800 MG PO TABS: 800.0000 mg | ORAL_TABLET | Freq: Three times a day (TID) | ORAL | 1 refills | Status: DC

## 2019-06-23 MED ORDER — AMLODIPINE BESYLATE 10 MG PO TABS: 10.0000 mg | ORAL_TABLET | Freq: Every day | ORAL | 1 refills | Status: DC

## 2019-06-23 MED ORDER — APIXABAN 2.5 MG PO TABS: 2.5000 mg | ORAL_TABLET | Freq: Two times a day (BID) | ORAL | 1 refills | Status: DC

## 2019-06-23 MED ORDER — IRBESARTAN 75 MG PO TABS: 75.0000 mg | ORAL_TABLET | Freq: Every day | ORAL | 1 refills | Status: DC

## 2019-06-23 MED ORDER — ALBUTEROL SULFATE HFA 108 (90 BASE) MCG/ACT IN AERS: 2.0000 | INHALATION_SPRAY | Freq: Four times a day (QID) | RESPIRATORY_TRACT | 0 refills | Status: DC | PRN

## 2019-06-23 MED ORDER — ISOSORBIDE MONONITRATE ER 60 MG PO TB24: 60.0000 mg | ORAL_TABLET | Freq: Every day | ORAL | 1 refills | Status: DC

## 2019-06-23 MED ORDER — HYDRALAZINE HCL 100 MG PO TABS: 100.0000 mg | ORAL_TABLET | Freq: Three times a day (TID) | ORAL | 1 refills | Status: DC

## 2019-06-23 MED ORDER — ATORVASTATIN CALCIUM 40 MG PO TABS: 40.0000 mg | ORAL_TABLET | Freq: Every day | ORAL | 1 refills | Status: DC

## 2019-06-23 NOTE — Progress Notes (Signed)
[facility], [place] 06/23/19  Subjective:   LOS: 1 12/12 0701 - 12/13 0700 In: -  Out: 3050 [Urine:1050] Patient presented to the emergency room after missing at least for dialysis treatment. He complained of chest pain yesterday. 2000 cc of fluid was removed with dialysis on Friday and 3000 cc removed on Saturday Today denies any chest pain or shortness of breath Resting comfortably   Objective:  Vital signs in last 24 hours:  Temp:  [97.7 F (36.5 C)-99 F (37.2 C)] 98.6 F (37 C) (12/13 0438) Pulse Rate:  [30-109] 78 (12/13 0742) Resp:  [14-22] 19 (12/13 0742) BP: (136-175)/(74-104) 142/74 (12/13 0742) SpO2:  [94 %-98 %] 94 % (12/13 0742) Weight:  [107.5 kg-111 kg] 107.5 kg (12/13 0438)  Weight change: -1.633 kg Filed Weights   06/22/19 1545 06/22/19 2005 06/23/19 0438  Weight: 111 kg 107.9 kg 107.5 kg    Intake/Output:    Intake/Output Summary (Last 24 hours) at 06/23/2019 1255 Last data filed at 06/23/2019 1035 Gross per 24 hour  Intake 480 ml  Output 2100 ml  Net -1620 ml     Physical Exam: General:  No acute distress, laying in the bed  HEENT  anicteric, moist oral mucous membranes  Pulm/lungs  mild bilateral basilar crackles, room air  CVS/Heart  irregular, no rub or gallop  Abdomen:   Soft, mild non-localized tenderness  Extremities:  Trace edema  Neurologic:  Alert, oriented  Skin:  No acute rashes  Access:  Left arm AV fistula, good bruit and thrill       Basic Metabolic Panel:  Recent Labs  Lab 06/21/19 1437 06/21/19 1711 06/22/19 0545  NA 138  --  142  K 4.8  --  3.5  CL 103  --  100  CO2 19*  --  31  GLUCOSE 95  --  96  BUN 100*  --  53*  CREATININE 14.14*  --  8.64*  CALCIUM 7.6*  --  7.7*  MG  --  2.5*  --   PHOS  --  9.0*  --      CBC: Recent Labs  Lab 06/21/19 1437 06/22/19 0545  WBC 5.9 5.1  HGB 9.9* 9.5*  HCT 27.9* 26.8*  MCV 84.5 83.8  PLT 103* 87*      Lab Results  Component  Value Date   HEPBSAG NON REACTIVE 06/21/2019   HEPBSAB Non Reactive 11/15/2018   HEPBIGM Negative 11/15/2018      Microbiology:  Recent Results (from the past 240 hour(s))  SARS CORONAVIRUS 2 (TAT 6-24 HRS) Nasopharyngeal Nasopharyngeal Swab     Status: None   Collection Time: 06/21/19 11:24 PM   Specimen: Nasopharyngeal Swab  Result Value Ref Range Status   SARS Coronavirus 2 NEGATIVE NEGATIVE Final    Comment: (NOTE) SARS-CoV-2 target nucleic acids are NOT DETECTED. The SARS-CoV-2 RNA is generally detectable in upper and lower respiratory specimens during the acute phase of infection. Negative results do not preclude SARS-CoV-2 infection, do not rule out co-infections with other pathogens, and should not be used as the sole basis for treatment or other patient management decisions. Negative results must be combined with clinical observations, patient history, and epidemiological information. The expected result is Negative. Fact Sheet for Patients: SugarRoll.be Fact Sheet for Healthcare Providers: https://www.woods-mathews.com/ This test is not yet approved or cleared by the [place] FDA and  has been authorized for detection and/or diagnosis of SARS-CoV-2 by FDA under an Emergency Use Authorization (  EUA). This EUA will remain  in effect (meaning this test can be used) for the duration of the COVID-19 declaration under Section 56 4(b)(1) of the Act, 21 U.S.C. section 360bbb-3(b)(1), unless the authorization is terminated or revoked sooner. Performed at [facility], [place] [address], [place], [place] [address]   MRSA PCR Screening     Status: None   Collection Time: 06/22/19  9:00 PM   Specimen: Nasopharyngeal  Result Value Ref Range Status   MRSA by PCR NEGATIVE NEGATIVE Final    Comment:        The GeneXpert MRSA Assay (FDA approved for NASAL specimens only), is one component of a comprehensive MRSA  colonization surveillance program. It is not intended to diagnose MRSA infection nor to guide or monitor treatment for MRSA infections. Performed at [facility], [place] [place], [place] [address]     Coagulation Studies: Recent Labs    06/21/19 1437  LABPROT 15.2  INR 1.2    Urinalysis: No results for input(s): COLORURINE, LABSPEC, PHURINE, GLUCOSEU, HGBUR, BILIRUBINUR, KETONESUR, PROTEINUR, UROBILINOGEN, NITRITE, LEUKOCYTESUR in the last 72 hours.  Invalid input(s): APPERANCEUR    Imaging: DG Chest 2 View  Result Date: 06/21/2019 CLINICAL DATA:  Chest pain, shortness of breath, has not received dialysis in greater than 10 days EXAM: CHEST - 2 VIEW COMPARISON:  Radiograph Nov 13, 2018, FINDINGS: Slightly cephalized vascularity with some hazy interstitial change and fissural thickening. Central pulmonary venous congestion is noted. No consolidative opacity. No pneumothorax or visible effusion. Mild cardiomegaly is similar to priors. No acute osseous or soft tissue abnormality. Degenerative changes are present in the imaged spine and shoulders. IMPRESSION: 1. Mild cardiomegaly with mild central pulmonary venous congestion. No overt edema or pleural effusions. Electronically Signed   By: Lovena Le M.D.   On: 06/21/2019 15:17     Medications:    . amLODipine  10 mg Oral Daily  . apixaban  2.5 mg Oral BID  . aspirin EC  81 mg Oral Daily  . atorvastatin  40 mg Oral QHS  . Chlorhexidine Gluconate Cloth  6 each Topical Q0600  . docusate sodium  100 mg Oral BID  . [START ON 06/25/2019] epoetin (EPOGEN/PROCRIT) injection  4,000 Units Intravenous Q T,Th,Sa-HD  . hydrALAZINE  100 mg Oral TID  . irbesartan  75 mg Oral QHS  . isosorbide mononitrate  60 mg Oral Daily  . nicotine  14 mg Transdermal Daily  . oxyCODONE  20 mg Oral Q12H  . sevelamer carbonate  1,600 mg Oral TID WC  . tamsulosin  0.4 mg Oral Daily  . terazosin  5 mg Oral QHS     Assessment/  Plan:  63 y.o. male with history of depression, coronary disease, hypertension, pulmonary embolism, hepatitis C, history of substance abuse presents with chest pain, malignant hypertension  Principal Problem:   Volume overload Active Problems:   Hypertension   Substance induced mood disorder ([facility])   Antisocial personality disorder ([place])   ESRD on dialysis ([facility])   Fluid overload  #Malignant hypertension, chest pain and fluid overload - BP better controlled - start low dose irbesartan at night (patient denies any allergies) -Patient was able to tolerate it well. -Patient counseled to follow 1 L fluid restriction daily.  #. ESRD -confirmed with outpatient center at [facility] kidney center that he is scheduled for TTS dialysis -Next HD on tuesday as outpatient  #. Anemia of CKD  Lab Results  Component Value Date  HGB 9.5 (L) 06/22/2019   Low dose EPO with HD    #. SHPTH     Component Value Date/Time   PTH 758 (H) 08/15/2018 1117   Lab Results  Component Value Date   PHOS 9.0 (H) 06/21/2019   Monitor calcium and phos level during this admission Will need sensipar as outpatient    LOS: [place] 12/13/202012:55 [place], [place]

## 2019-06-23 NOTE — Plan of Care (Signed)
  Problem: Education: Goal: Knowledge of General Education information will improve Description: Including pain rating scale, medication(s)/side effects and non-pharmacologic comfort measures Outcome: Progressing   Problem: Pain Managment: Goal: General experience of comfort will improve Outcome: Progressing   Problem: Safety: Goal: Ability to remain free from injury will improve Outcome: Progressing   

## 2019-06-23 NOTE — Discharge Summary (Signed)
Physician Discharge Summary  [name] [id number] DOB: 1956/02/08 DOA: 06/21/2019  PCP: Patient, No Pcp Per  Admit date: 06/21/2019 Discharge date: 06/24/2019  Discharge disposition: Home   Recommendations for Outpatient Follow-Up:   Outpatient follow-up with PCP nephrologist   Discharge Diagnosis:   Principal Problem:   Volume overload Active Problems:   Hypertension   Substance induced mood disorder ([place])   Antisocial personality disorder ([place])   ESRD on dialysis ([place])   Fluid overload    Discharge Condition: Stable.  Diet recommendation: Low salt diet  Code status: Full code.    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  He recently moved from [place] to [place] but he had not established care with a nephrologist in [place].  He had also not taking much of his medicines for over a week because he had run out of medicines.  He presented to the hospital for hemodialysis and he also complained of chest pain.  He said that his chest pain was due to fluid overload.  In the ED, he was found to have hypertensive urgency.  He was evaluated by the nephrologist, Dr. Murlean Iba, and he underwent emergent hemodialysis.  Chest pain has resolved her blood pressure has improved.  Arrangements have been made for him to have outpatient hemodialysis center at the [facility] on Tuesdays, Thursdays and Saturdays.  He is deemed stable for discharge to home.  He has been advised to illicit drugs including cocaine.    Discharge Exam:   Vitals:   06/23/19 0438 06/23/19 0742  BP: 140/83 (!) 142/74  Pulse: (!) 107 78  Resp:  19  Temp: 98.6 F (37 C)   SpO2: 96%  94%   Vitals:   06/22/19 2005 06/22/19 2044 06/23/19 0438 06/23/19 0742  BP:  (!) 146/90 140/83 (!) 142/74  Pulse:  (!) 58 (!) 107 78  Resp:  18  19  Temp:  99 F (37.2 C) 98.6 F (37 C)   TempSrc:  Oral Oral   SpO2:  98% 96% 94%  Weight: 107.9 kg  107.5 kg   Height:         GEN: NAD SKIN: No rash EYES: EOMI ENT: MMM CV: RRR PULM: CTA B ABD: soft, ND, NT, +BS CNS: AAO x 3, non focal EXT: No edema or tenderness   The results of significant diagnostics from this hospitalization (including imaging, microbiology, ancillary and laboratory) are listed below for reference.     Procedures and Diagnostic Studies:   DG Chest 2 View  Result Date: 06/21/2019 CLINICAL DATA:  Chest pain, shortness of breath, has not received dialysis in greater than 10 days EXAM: CHEST - 2 VIEW COMPARISON:  Radiograph Nov 13, 2018, FINDINGS: Slightly cephalized vascularity with some hazy interstitial change and fissural thickening. Central pulmonary venous congestion is noted. No consolidative opacity. No pneumothorax or visible effusion. Mild cardiomegaly is similar to priors. No acute osseous or soft tissue abnormality. Degenerative changes are present in the imaged spine and shoulders. IMPRESSION: 1. Mild cardiomegaly with mild central pulmonary venous congestion. No overt edema or pleural effusions. Electronically Signed   By: Lovena Le M.D.   On: 06/21/2019 15:17     Labs:   Basic Metabolic Panel: Recent Labs  Lab 06/21/19 1437 06/21/19 1711 06/22/19 0545  NA 138  --  142  K 4.8  --  3.5  CL 103  --  100  CO2 19*  --  31  GLUCOSE 95  --  96  BUN 100*  --  53*  CREATININE 14.14*  --  8.64*  CALCIUM 7.6*  --  7.7*  MG  --  2.5*  --   PHOS  --  9.0*  --    GFR Estimated Creatinine Clearance: 11.4 mL/min (A) (by C-G formula based on SCr of 8.64 mg/dL (H)). Liver Function Tests: Recent Labs  Lab 06/22/19 0545  AST 14*  ALT 15  ALKPHOS 41  BILITOT 1.1  PROT 6.0*  ALBUMIN 3.3*     No results for input(s): LIPASE, AMYLASE in the last 168 hours. No results for input(s): AMMONIA in the last 168 hours. Coagulation profile Recent Labs  Lab 06/21/19 1437  INR 1.2    CBC: Recent Labs  Lab 06/21/19 1437 06/22/19 0545  WBC 5.9 5.1  HGB 9.9* 9.5*  HCT 27.9* 26.8*  MCV 84.5 83.8  PLT 103* 87*   Cardiac Enzymes: No results for input(s): CKTOTAL, CKMB, CKMBINDEX, TROPONINI in the last 168 hours. BNP: Invalid input(s): POCBNP CBG: No results for input(s): GLUCAP in the last 168 hours. D-Dimer No results for input(s): DDIMER in the last 72 hours. Hgb A1c No results for input(s): HGBA1C in the last 72 hours. Lipid Profile No results for input(s): CHOL, HDL, LDLCALC, TRIG, CHOLHDL, LDLDIRECT in the last 72 hours. Thyroid function studies No results for input(s): TSH, T4TOTAL, T3FREE, THYROIDAB in the last 72 hours.  Invalid input(s): FREET3 Anemia work up No results for input(s): VITAMINB12, FOLATE, FERRITIN, TIBC, IRON, RETICCTPCT in the last 72 hours. Microbiology Recent Results (from the past 240 hour(s))  SARS CORONAVIRUS 2 (TAT 6-24 HRS) Nasopharyngeal Nasopharyngeal Swab     Status: None   Collection Time: 06/21/19 11:24 PM   Specimen: Nasopharyngeal Swab  Result Value Ref Range Status   SARS Coronavirus 2 NEGATIVE NEGATIVE Final    Comment: (NOTE) SARS-CoV-2 target nucleic acids are NOT DETECTED. The SARS-CoV-2 RNA is generally detectable in upper and lower respiratory specimens during the acute phase of infection. Negative results do not preclude SARS-CoV-2 infection, do not rule out co-infections with other pathogens, and should not be used as the sole basis for treatment or other patient management decisions. Negative results must be combined with clinical observations, patient history, and epidemiological information. The expected result is Negative. Fact Sheet for Patients: SugarRoll.be Fact Sheet for  Healthcare Providers: https://www.woods-mathews.com/ This test is not yet approved or cleared by the [place] FDA and  has been authorized for detection and/or diagnosis of SARS-CoV-2 by FDA under an Emergency Use Authorization (EUA). This EUA will remain  in effect (meaning this test can be used) for the duration of the COVID-19 declaration under Section 56 4(b)(1) of the Act, 21 U.S.C. section 360bbb-3(b)(1), unless the authorization is terminated or revoked sooner. Performed at [facility], [place] [address], [place], [place] [address]   MRSA PCR Screening     Status: None   Collection Time: 06/22/19  9:00 PM   Specimen: Nasopharyngeal  Result Value Ref Range Status   MRSA by PCR NEGATIVE NEGATIVE Final    Comment:        The GeneXpert MRSA Assay (FDA approved for NASAL specimens only), is one component of a comprehensive MRSA colonization surveillance program. It is  not intended to diagnose MRSA infection nor to guide or monitor treatment for MRSA infections. Performed at [facility], [address] [place], [place] [address]      Discharge Instructions:   Discharge Instructions    Diet - low sodium heart healthy   Complete by: As directed    Discharge instructions   Complete by: As directed    Follow-up at [facility] for outpatient dialysis on Tuesdays, Thursdays and Saturdays.   Increase activity slowly   Complete by: As directed      Allergies as of 06/23/2019      Reactions   Cyclobenzaprine Nausea Only   Clonidine Other (See Comments)   Asymptomatic bradycardia to 38   Furosemide Other (See Comments)   Caused renal failure   Tylenol [acetaminophen] Other (See Comments)   Reaction:  Pt states it bothers his liver      Medication List    TAKE these medications   albuterol 108 (90 Base) MCG/ACT inhaler Commonly known as: VENTOLIN HFA Inhale 2 puffs into the lungs every 6 (six) hours as needed for wheezing  or shortness of breath.   amLODipine 10 MG tablet Commonly known as: NORVASC Take 1 tablet (10 mg total) by mouth daily.   apixaban 2.5 MG Tabs tablet Commonly known as: Eliquis Take 1 tablet (2.5 mg total) by mouth 2 (two) times daily.   atorvastatin 40 MG tablet Commonly known as: LIPITOR Take 1 tablet (40 mg total) by mouth at bedtime.   hydrALAZINE 100 MG tablet Commonly known as: APRESOLINE Take 1 tablet (100 mg total) by mouth 3 (three) times daily.   irbesartan 75 MG tablet Commonly known as: AVAPRO Take 1 tablet (75 mg total) by mouth at bedtime.   isosorbide mononitrate 60 MG 24 hr tablet Commonly known as: IMDUR Take 1 tablet (60 mg total) by mouth daily.   oxyCODONE 20 mg 12 hr tablet Commonly known as: OXYCONTIN Take 20 mg by mouth every 12 (twelve) hours.   oxyCODONE 5 MG immediate release tablet Commonly known as: Oxy IR/ROXICODONE Take 5 mg by mouth 2 (two) times daily as needed for severe pain.   sevelamer carbonate 800 MG tablet Commonly known as: RENVELA Take 1 tablet (800 mg total) by mouth 3 (three) times daily with meals.   tamsulosin 0.4 MG Caps capsule Commonly known as: FLOMAX Take 1 capsule (0.4 mg total) by mouth daily.         Time coordinating discharge: 28 minutes  Signed:  Genavive Kubicki  [facility] 06/24/2019, 9:43 PM

## 2019-06-23 NOTE — Progress Notes (Signed)
Patient is being discharged this afternoon to home. DC & Rx instructions given and patient acknowledged understanding. IV removed.

## 2019-07-04 ENCOUNTER — Emergency Department: Payer: Medicare Other

## 2019-07-04 ENCOUNTER — Encounter: Payer: Self-pay | Admitting: Emergency Medicine

## 2019-07-04 ENCOUNTER — Other Ambulatory Visit: Payer: Self-pay

## 2019-07-04 ENCOUNTER — Inpatient Hospital Stay
Admission: EM | Admit: 2019-07-04 | Discharge: 2019-07-08 | DRG: 291 | Disposition: A | Discharge status: Home / Self Care | Payer: Medicare Other | Attending: Internal Medicine | Admitting: Internal Medicine

## 2019-07-04 DIAGNOSIS — J9601 Acute respiratory failure with hypoxia: Secondary | ICD-10-CM | POA: Diagnosis not present

## 2019-07-04 DIAGNOSIS — M545 Low back pain, unspecified: Secondary | ICD-10-CM

## 2019-07-04 DIAGNOSIS — D631 Anemia in chronic kidney disease: Secondary | ICD-10-CM | POA: Diagnosis present

## 2019-07-04 DIAGNOSIS — Z9115 Patient's noncompliance with renal dialysis: Secondary | ICD-10-CM | POA: Diagnosis not present

## 2019-07-04 DIAGNOSIS — I10 Essential (primary) hypertension: Secondary | ICD-10-CM | POA: Diagnosis present

## 2019-07-04 DIAGNOSIS — N2581 Secondary hyperparathyroidism of renal origin: Secondary | ICD-10-CM | POA: Diagnosis present

## 2019-07-04 DIAGNOSIS — Z86711 Personal history of pulmonary embolism: Secondary | ICD-10-CM

## 2019-07-04 DIAGNOSIS — I132 Hypertensive heart and chronic kidney disease with heart failure and with stage 5 chronic kidney disease, or end stage renal disease: Secondary | ICD-10-CM | POA: Diagnosis present

## 2019-07-04 DIAGNOSIS — Z7901 Long term (current) use of anticoagulants: Secondary | ICD-10-CM

## 2019-07-04 DIAGNOSIS — E8770 Fluid overload, unspecified: Secondary | ICD-10-CM | POA: Diagnosis not present

## 2019-07-04 DIAGNOSIS — N2889 Other specified disorders of kidney and ureter: Secondary | ICD-10-CM | POA: Diagnosis not present

## 2019-07-04 DIAGNOSIS — R0602 Shortness of breath: Secondary | ICD-10-CM | POA: Diagnosis not present

## 2019-07-04 DIAGNOSIS — F17210 Nicotine dependence, cigarettes, uncomplicated: Secondary | ICD-10-CM | POA: Diagnosis present

## 2019-07-04 DIAGNOSIS — I2510 Atherosclerotic heart disease of native coronary artery without angina pectoris: Secondary | ICD-10-CM | POA: Diagnosis present

## 2019-07-04 DIAGNOSIS — I509 Heart failure, unspecified: Secondary | ICD-10-CM | POA: Diagnosis not present

## 2019-07-04 DIAGNOSIS — G8929 Other chronic pain: Secondary | ICD-10-CM | POA: Diagnosis present

## 2019-07-04 DIAGNOSIS — R079 Chest pain, unspecified: Secondary | ICD-10-CM | POA: Diagnosis not present

## 2019-07-04 DIAGNOSIS — F329 Major depressive disorder, single episode, unspecified: Secondary | ICD-10-CM | POA: Diagnosis present

## 2019-07-04 DIAGNOSIS — N186 End stage renal disease: Secondary | ICD-10-CM | POA: Diagnosis present

## 2019-07-04 DIAGNOSIS — I160 Hypertensive urgency: Secondary | ICD-10-CM | POA: Diagnosis present

## 2019-07-04 DIAGNOSIS — I5031 Acute diastolic (congestive) heart failure: Secondary | ICD-10-CM

## 2019-07-04 DIAGNOSIS — M549 Dorsalgia, unspecified: Secondary | ICD-10-CM | POA: Diagnosis present

## 2019-07-04 DIAGNOSIS — Z992 Dependence on renal dialysis: Secondary | ICD-10-CM

## 2019-07-04 DIAGNOSIS — R0600 Dyspnea, unspecified: Secondary | ICD-10-CM

## 2019-07-04 DIAGNOSIS — I5033 Acute on chronic diastolic (congestive) heart failure: Secondary | ICD-10-CM | POA: Diagnosis present

## 2019-07-04 DIAGNOSIS — Z79891 Long term (current) use of opiate analgesic: Secondary | ICD-10-CM

## 2019-07-04 DIAGNOSIS — E785 Hyperlipidemia, unspecified: Secondary | ICD-10-CM | POA: Diagnosis present

## 2019-07-04 DIAGNOSIS — I151 Hypertension secondary to other renal disorders: Secondary | ICD-10-CM | POA: Diagnosis not present

## 2019-07-04 DIAGNOSIS — D638 Anemia in other chronic diseases classified elsewhere: Secondary | ICD-10-CM

## 2019-07-04 DIAGNOSIS — F602 Antisocial personality disorder: Secondary | ICD-10-CM | POA: Diagnosis present

## 2019-07-04 DIAGNOSIS — Z79899 Other long term (current) drug therapy: Secondary | ICD-10-CM

## 2019-07-04 DIAGNOSIS — Z8619 Personal history of other infectious and parasitic diseases: Secondary | ICD-10-CM | POA: Diagnosis not present

## 2019-07-04 DIAGNOSIS — F1994 Other psychoactive substance use, unspecified with psychoactive substance-induced mood disorder: Secondary | ICD-10-CM | POA: Diagnosis not present

## 2019-07-04 DIAGNOSIS — F1414 Cocaine abuse with cocaine-induced mood disorder: Secondary | ICD-10-CM | POA: Diagnosis present

## 2019-07-04 DIAGNOSIS — I252 Old myocardial infarction: Secondary | ICD-10-CM

## 2019-07-04 DIAGNOSIS — Z20828 Contact with and (suspected) exposure to other viral communicable diseases: Secondary | ICD-10-CM | POA: Diagnosis present

## 2019-07-04 DIAGNOSIS — D696 Thrombocytopenia, unspecified: Secondary | ICD-10-CM | POA: Diagnosis present

## 2019-07-04 DIAGNOSIS — J9621 Acute and chronic respiratory failure with hypoxia: Secondary | ICD-10-CM | POA: Diagnosis present

## 2019-07-04 DIAGNOSIS — E8779 Other fluid overload: Secondary | ICD-10-CM | POA: Diagnosis not present

## 2019-07-04 DIAGNOSIS — Z888 Allergy status to other drugs, medicaments and biological substances status: Secondary | ICD-10-CM

## 2019-07-04 DIAGNOSIS — Z96649 Presence of unspecified artificial hip joint: Secondary | ICD-10-CM | POA: Diagnosis present

## 2019-07-04 DIAGNOSIS — Z886 Allergy status to analgesic agent status: Secondary | ICD-10-CM

## 2019-07-04 DIAGNOSIS — N400 Enlarged prostate without lower urinary tract symptoms: Secondary | ICD-10-CM | POA: Diagnosis present

## 2019-07-04 LAB — CBC WITH DIFFERENTIAL/PLATELET
Abs Immature Granulocytes: 0.01 10*3/uL (ref 0.00–0.07)
Basophils Absolute: 0.1 10*3/uL (ref 0.0–0.1)
Basophils Relative: 1 %
Eosinophils Absolute: 0.1 10*3/uL (ref 0.0–0.5)
Eosinophils Relative: 2 %
HCT: 28 % — ABNORMAL LOW (ref 39.0–52.0)
Hemoglobin: 9.7 g/dL — ABNORMAL LOW (ref 13.0–17.0)
Immature Granulocytes: 0 %
Lymphocytes Relative: 22 %
Lymphs Abs: 1.2 10*3/uL (ref 0.7–4.0)
MCH: 29.5 pg (ref 26.0–34.0)
MCHC: 34.6 g/dL (ref 30.0–36.0)
MCV: 85.1 fL (ref 80.0–100.0)
Monocytes Absolute: 0.6 10*3/uL (ref 0.1–1.0)
Monocytes Relative: 11 %
Neutro Abs: 3.5 10*3/uL (ref 1.7–7.7)
Neutrophils Relative %: 64 %
Platelets: 102 10*3/uL — ABNORMAL LOW (ref 150–400)
RBC: 3.29 MIL/uL — ABNORMAL LOW (ref 4.22–5.81)
RDW: 15.3 % (ref 11.5–15.5)
WBC: 5.5 10*3/uL (ref 4.0–10.5)
nRBC: 0 % (ref 0.0–0.2)

## 2019-07-04 LAB — TROPONIN I (HIGH SENSITIVITY)
Troponin I (High Sensitivity): 70 ng/L — ABNORMAL HIGH (ref <18)
Troponin I (High Sensitivity): 67 ng/L — ABNORMAL HIGH (ref <18)

## 2019-07-04 LAB — RESPIRATORY PANEL BY RT PCR (FLU A&B, COVID)
Influenza A by PCR: NEGATIVE
Influenza B by PCR: NEGATIVE
SARS Coronavirus 2 by RT PCR: NEGATIVE

## 2019-07-04 LAB — COMPREHENSIVE METABOLIC PANEL
ALT: 25 U/L (ref 0–44)
AST: 20 U/L (ref 15–41)
Albumin: 3.5 g/dL (ref 3.5–5.0)
Alkaline Phosphatase: 45 U/L (ref 38–126)
Anion gap: 14 (ref 5–15)
BUN: 73 mg/dL — ABNORMAL HIGH (ref 8–23)
CO2: 23 mmol/L (ref 22–32)
Calcium: 8.3 mg/dL — ABNORMAL LOW (ref 8.9–10.3)
Chloride: 104 mmol/L (ref 98–111)
Creatinine, Ser: 11.35 mg/dL — ABNORMAL HIGH (ref 0.61–1.24)
GFR calc Af Amer: 5 mL/min — ABNORMAL LOW (ref >60)
GFR calc non Af Amer: 4 mL/min — ABNORMAL LOW (ref >60)
Glucose, Bld: 93 mg/dL (ref 70–99)
Potassium: 5 mmol/L (ref 3.5–5.1)
Sodium: 141 mmol/L (ref 135–145)
Total Bilirubin: 1 mg/dL (ref 0.3–1.2)
Total Protein: 6.5 g/dL (ref 6.5–8.1)

## 2019-07-04 LAB — BRAIN NATRIURETIC PEPTIDE: B Natriuretic Peptide: 4024 pg/mL — ABNORMAL HIGH (ref 0.0–100.0)

## 2019-07-04 LAB — FIBRIN DERIVATIVES D-DIMER (ARMC ONLY): Fibrin derivatives D-dimer (ARMC): 1034.54 ng/mL (FEU) — ABNORMAL HIGH (ref 0.00–499.00)

## 2019-07-04 MED ORDER — FERRIC CITRATE 1 GM 210 MG(FE) PO TABS
210.0000 mg | ORAL_TABLET | Freq: Three times a day (TID) | ORAL | Status: DC
  Administered 2019-07-04 – 2019-07-08 (×9): 210 mg via ORAL
  Filled 2019-07-04 (×15): qty 1

## 2019-07-04 MED ORDER — AMLODIPINE BESYLATE 10 MG PO TABS
10.0000 mg | ORAL_TABLET | Freq: Every day | ORAL | Status: DC
  Administered 2019-07-05 – 2019-07-08 (×4): 10 mg via ORAL
  Filled 2019-07-04 (×4): qty 1

## 2019-07-04 MED ORDER — IOHEXOL 350 MG/ML SOLN
100.0000 mL | Freq: Once | INTRAVENOUS | Status: AC | PRN
  Administered 2019-07-04: 11:00:00 100 mL via INTRAVENOUS

## 2019-07-04 MED ORDER — IRBESARTAN 150 MG PO TABS
75.0000 mg | ORAL_TABLET | Freq: Every day | ORAL | Status: DC
  Administered 2019-07-04 – 2019-07-07 (×4): 75 mg via ORAL
  Filled 2019-07-04 (×5): qty 1

## 2019-07-04 MED ORDER — SODIUM CHLORIDE 0.9% FLUSH
3.0000 mL | Freq: Two times a day (BID) | INTRAVENOUS | Status: DC
  Administered 2019-07-04 – 2019-07-08 (×9): 3 mL via INTRAVENOUS

## 2019-07-04 MED ORDER — OXYCODONE HCL ER 10 MG PO T12A
20.0000 mg | EXTENDED_RELEASE_TABLET | Freq: Two times a day (BID) | ORAL | Status: DC
  Administered 2019-07-04 – 2019-07-08 (×9): 20 mg via ORAL
  Filled 2019-07-04 (×9): qty 2

## 2019-07-04 MED ORDER — ONDANSETRON HCL 4 MG/2ML IJ SOLN
4.0000 mg | Freq: Four times a day (QID) | INTRAMUSCULAR | Status: DC | PRN
  Administered 2019-07-05 – 2019-07-07 (×3): 4 mg via INTRAVENOUS
  Filled 2019-07-04 (×3): qty 2

## 2019-07-04 MED ORDER — OXYCODONE HCL 5 MG PO TABS
5.0000 mg | ORAL_TABLET | ORAL | Status: DC | PRN
  Administered 2019-07-04 – 2019-07-08 (×13): 10 mg via ORAL
  Filled 2019-07-04 (×13): qty 2

## 2019-07-04 MED ORDER — SEVELAMER CARBONATE 800 MG PO TABS
800.0000 mg | ORAL_TABLET | Freq: Three times a day (TID) | ORAL | Status: DC
  Administered 2019-07-04 – 2019-07-08 (×9): 800 mg via ORAL
  Filled 2019-07-04 (×12): qty 1

## 2019-07-04 MED ORDER — ISOSORBIDE MONONITRATE ER 30 MG PO TB24
60.0000 mg | ORAL_TABLET | Freq: Every day | ORAL | Status: DC
  Administered 2019-07-05 – 2019-07-08 (×4): 60 mg via ORAL
  Filled 2019-07-04: qty 2
  Filled 2019-07-04: qty 1
  Filled 2019-07-04 (×3): qty 2

## 2019-07-04 MED ORDER — MORPHINE SULFATE (PF) 2 MG/ML IV SOLN
2.0000 mg | Freq: Once | INTRAVENOUS | Status: AC
  Administered 2019-07-04: 09:00:00 2 mg via INTRAVENOUS
  Filled 2019-07-04: qty 1

## 2019-07-04 MED ORDER — ATORVASTATIN CALCIUM 20 MG PO TABS
40.0000 mg | ORAL_TABLET | Freq: Every day | ORAL | Status: DC
  Administered 2019-07-04 – 2019-07-07 (×4): 40 mg via ORAL
  Filled 2019-07-04 (×5): qty 2

## 2019-07-04 MED ORDER — MORPHINE SULFATE (PF) 2 MG/ML IV SOLN
2.0000 mg | Freq: Once | INTRAVENOUS | Status: AC
  Administered 2019-07-04: 11:00:00 2 mg via INTRAVENOUS
  Filled 2019-07-04: qty 1

## 2019-07-04 MED ORDER — APIXABAN 2.5 MG PO TABS
2.5000 mg | ORAL_TABLET | Freq: Two times a day (BID) | ORAL | Status: DC
  Administered 2019-07-04 – 2019-07-08 (×8): 2.5 mg via ORAL
  Filled 2019-07-04 (×9): qty 1

## 2019-07-04 MED ORDER — TAMSULOSIN HCL 0.4 MG PO CAPS
0.4000 mg | ORAL_CAPSULE | Freq: Every day | ORAL | Status: DC
  Administered 2019-07-05 – 2019-07-08 (×4): 0.4 mg via ORAL
  Filled 2019-07-04 (×5): qty 1

## 2019-07-04 MED ORDER — POLYETHYLENE GLYCOL 3350 17 G PO PACK
17.0000 g | PACK | Freq: Every day | ORAL | Status: DC | PRN
  Filled 2019-07-04: qty 1

## 2019-07-04 MED ORDER — HYDRALAZINE HCL 50 MG PO TABS
100.0000 mg | ORAL_TABLET | Freq: Three times a day (TID) | ORAL | Status: DC
  Administered 2019-07-04 – 2019-07-08 (×11): 100 mg via ORAL
  Filled 2019-07-04 (×13): qty 2

## 2019-07-04 MED ORDER — ONDANSETRON HCL 4 MG PO TABS
4.0000 mg | ORAL_TABLET | Freq: Four times a day (QID) | ORAL | Status: DC | PRN
  Filled 2019-07-04: qty 1

## 2019-07-04 MED ORDER — ALBUTEROL SULFATE HFA 108 (90 BASE) MCG/ACT IN AERS
2.0000 | INHALATION_SPRAY | Freq: Four times a day (QID) | RESPIRATORY_TRACT | Status: DC | PRN
  Filled 2019-07-04: qty 6.7

## 2019-07-04 NOTE — Progress Notes (Signed)
[facility]  ROUNDING NOTE   Subjective:   [name] admitted to [facility] on 07/04/2019 for Volume overload [E87.70]  Last hemodialysis was last admission on 12/13. He was set up for dialysis at [facility]. However patient has not made his outpatient dialysis appointment.   Objective:  Vital signs in last 24 hours:  Temp:  [97.9 F (36.6 C)] 97.9 F (36.6 C) (12/24 0836) Pulse Rate:  [72-80] 80 (12/24 1530) Resp:  [16-26] 23 (12/24 1530) BP: (156-181)/(92-113) 171/113 (12/24 1530) SpO2:  [98 %-100 %] 100 % (12/24 1513) Weight:  [108.9 kg] 108.9 kg (12/24 0834)  Weight change:  Filed Weights   07/04/19 0834  Weight: 108.9 kg    Intake/Output: No intake/output data recorded.   Intake/Output this shift:  No intake/output data recorded.  Physical Exam: General: NAD,   Head: Normocephalic, atraumatic. Moist oral mucosal membranes  Eyes: Anicteric, PERRL  Neck: Supple, trachea midline  Lungs:  Clear to auscultation  Heart: Regular rate and rhythm  Abdomen:  Soft, nontender,   Extremities:  + peripheral edema.  Neurologic: Nonfocal, moving all four extremities  Skin: No lesions  Access: Left AVF    Basic Metabolic Panel: Recent Labs  Lab 07/04/19 0846  NA 141  K 5.0  CL 104  CO2 23  GLUCOSE 93  BUN 73*  CREATININE 11.35*  CALCIUM 8.3*    Liver Function Tests: Recent Labs  Lab 07/04/19 0846  AST 20  ALT 25  ALKPHOS 45  BILITOT 1.0  PROT 6.5  ALBUMIN 3.5   No results for input(s): LIPASE, AMYLASE in the last 168 hours. No results for input(s): AMMONIA in the last 168 hours.  CBC: Recent Labs  Lab 07/04/19 0846  WBC 5.5  NEUTROABS 3.5  HGB 9.7*  HCT 28.0*  MCV 85.1  PLT 102*    Cardiac Enzymes: No results for input(s): CKTOTAL, CKMB, CKMBINDEX, TROPONINI in the last 168 hours.  BNP: Invalid input(s): POCBNP  CBG: No results for input(s): GLUCAP in the last 168 hours.  Microbiology: Results for orders  placed or performed during the hospital encounter of 07/04/19  Respiratory Panel by RT PCR (Flu A&B, Covid) - Nasopharyngeal Swab     Status: None   Collection Time: 07/04/19 12:09 PM   Specimen: Nasopharyngeal Swab  Result Value Ref Range Status   SARS Coronavirus 2 by RT PCR NEGATIVE NEGATIVE Final    Comment: (NOTE) SARS-CoV-2 target nucleic acids are NOT DETECTED. The SARS-CoV-2 RNA is generally detectable in upper respiratoy specimens during the acute phase of infection. The lowest concentration of SARS-CoV-2 viral copies this assay can detect is 131 copies/mL. A negative result does not preclude SARS-Cov-2 infection and should not be used as the sole basis for treatment or other patient management decisions. A negative result may occur with  improper specimen collection/handling, submission of specimen other than nasopharyngeal swab, presence of viral mutation(s) within the areas targeted by this assay, and inadequate number of viral copies (<131 copies/mL). A negative result must be combined with clinical observations, patient history, and epidemiological information. The expected result is Negative. Fact Sheet for Patients:  PinkCheek.be Fact Sheet for Healthcare Providers:  GravelBags.it This test is not yet ap proved or cleared by the [place] FDA and  has been authorized for detection and/or diagnosis of SARS-CoV-2 by FDA under an Emergency Use Authorization (EUA). This EUA will remain  in effect (meaning this test can be used) for the duration of the  COVID-19 declaration under Section 564(b)(1) of the Act, 21 U.S.C. section 360bbb-3(b)(1), unless the authorization is terminated or revoked sooner.    Influenza A by PCR NEGATIVE NEGATIVE Final   Influenza B by PCR NEGATIVE NEGATIVE Final    Comment: (NOTE) The Xpert Xpress SARS-CoV-2/FLU/RSV assay is intended as an aid in  the diagnosis of influenza from  Nasopharyngeal swab specimens and  should not be used as a sole basis for treatment. Nasal washings and  aspirates are unacceptable for Xpert Xpress SARS-CoV-2/FLU/RSV  testing. Fact Sheet for Patients: PinkCheek.be Fact Sheet for Healthcare Providers: GravelBags.it This test is not yet approved or cleared by the [place] FDA and  has been authorized for detection and/or diagnosis of SARS-CoV-2 by  FDA under an Emergency Use Authorization (EUA). This EUA will remain  in effect (meaning this test can be used) for the duration of the  Covid-19 declaration under Section 564(b)(1) of the Act, 21  U.S.C. section 360bbb-3(b)(1), unless the authorization is  terminated or revoked. Performed at [facility], [address] [place], [place] [address]     Coagulation Studies: No results for input(s): LABPROT, INR in the last 72 hours.  Urinalysis: No results for input(s): COLORURINE, LABSPEC, PHURINE, GLUCOSEU, HGBUR, BILIRUBINUR, KETONESUR, PROTEINUR, UROBILINOGEN, NITRITE, LEUKOCYTESUR in the last 72 hours.  Invalid input(s): APPERANCEUR    Imaging: CT Angio Chest PE W and/or Wo Contrast  Result Date: 07/04/2019 CLINICAL DATA:  Shortness of breath.  Chest pain.  Renal failure EXAM: CT ANGIOGRAPHY CHEST WITH CONTRAST TECHNIQUE: Multidetector CT imaging of the chest was performed using the standard protocol during bolus administration of intravenous contrast. Multiplanar CT image reconstructions and MIPs were obtained to evaluate the vascular anatomy. CONTRAST:  165mL OMNIPAQUE IOHEXOL 350 MG/ML SOLN COMPARISON:  Chest radiograph July 04, 2019 chest CT angiogram August 27, 2018 FINDINGS: Cardiovascular: There is no demonstrable pulmonary embolus. The ascending thoracic aortic diameter measures 4.3 x 4.3 cm. There is no evident thoracic aortic dissection. Visualized great vessels show scattered foci of arterial  vascular calcification. There is aortic atherosclerosis. There are foci of coronary artery calcification. Heart is mildly enlarged in a generalized manner. There is no pericardial effusion or pericardial thickening. The main pulmonary outflow tract is enlarged, measuring 3.9 cm in diameter. Mediastinum/Nodes: Visualized thyroid appears unremarkable. There are scattered subcentimeter mediastinal lymph nodes. There is lymph node enlargement in the subcarinal region with the largest lymph node in this area measuring 1.8 x 1.6 cm. There is a pretracheal lymph node measuring 1.6 x 1.4 cm. There is an aortopulmonary window lymph node measuring 1.5 x 1.4 cm. No esophageal lesions are evident. Lungs/Pleura: There is atelectatic change in the right base and in the right upper lobe near the apex. There is a stable small bulla in the lateral segment left lower lobe. There is no edema or consolidation. No pleural effusions are evident. Upper Abdomen: There is aortic atherosclerosis in the upper abdominal aorta. Adrenals appear somewhat hypertrophied bilaterally. Visualized upper abdominal structures otherwise appear unremarkable. Musculoskeletal: No blastic or lytic bone lesions evident. No chest wall lesions appreciable. Review of the MIP images confirms the above findings. IMPRESSION: 1.  No appreciable pulmonary embolus. 2. Ascending thoracic aortic prominence with ascending thoracic aorta measuring 4.3 x 4.3 cm in transverse diameter. No dissection evident. There are foci of aortic atherosclerosis as well as foci of great vessel and coronary artery calcification. Recommend annual imaging followup by CTA or MRA. This recommendation follows 2010 ACCF/AHA/AATS/ACR/ASA/SCA/SCAI/SIR/STS/SVM Guidelines for the  Diagnosis and Management of Patients with Thoracic Aortic Disease. Circulation. 2010; [id number]. Aortic aneurysm NOS (ICD10-I71.9). 3. Enlargement of the main pulmonary outflow tract, a finding indicative of pulmonary  arterial hypertension. 4.  Areas of atelectatic change.  No edema or consolidation. 5.  Several enlarged lymph nodes of uncertain etiology. 6.  Bilateral adrenal hypertrophy of uncertain etiology. Aortic Atherosclerosis (ICD10-I70.0). Electronically Signed   By: Lowella Grip III M.D.   On: 07/04/2019 11:30   DG Chest Portable 1 View  Result Date: 07/04/2019 CLINICAL DATA:  Chest pain and shortness of breath EXAM: PORTABLE CHEST 1 VIEW COMPARISON:  June 21, 2019 FINDINGS: There is no edema or consolidation. There is cardiomegaly with mild pulmonary venous hypertension. No adenopathy. No bone lesions. IMPRESSION: Cardiomegaly with pulmonary vascular congestion. No edema or consolidation. Electronically Signed   By: Lowella Grip III M.D.   On: 07/04/2019 08:59     Medications:    . amLODipine  10 mg Oral Daily  . apixaban  2.5 mg Oral BID  . atorvastatin  40 mg Oral QHS  . ferric citrate  210 mg Oral TID WC  . hydrALAZINE  100 mg Oral TID  . irbesartan  75 mg Oral QHS  . isosorbide mononitrate  60 mg Oral Daily  . oxyCODONE  20 mg Oral Q12H  . sevelamer carbonate  800 mg Oral TID WC  . sodium chloride flush  3 mL Intravenous Q12H  . tamsulosin  0.4 mg Oral Daily   albuterol, ondansetron **OR** ondansetron (ZOFRAN) IV, oxyCODONE, polyethylene glycol  Assessment/ Plan:  [name] is a 63 y.o. black male with end stage renal disease on hemodialysis, hypertension, depression, coronary disease, hypertension, pulmonary embolism, hepatitis C, history of substance abuse presents to [facility] on 07/04/2019 for Volume overload [E87.70]  No outpatient dialysis unit.   1. End Stage Renal Disease: Emergent hemodialysis treatment today.   2. Hypertensive urgency: resume home blood pressure regimen.   3. Anemia of chronic kidney disease: hemoglobin 9.7.  - hold EPO due to hypertension.   4. Secondary Hyperparathyroidism:  - sevelamer   LOS: 0 Zackary Mckeone 12/24/20203:49 PM

## 2019-07-04 NOTE — Progress Notes (Signed)
Hd completed 

## 2019-07-04 NOTE — ED Notes (Signed)
Report called by this nurse to the floor.

## 2019-07-04 NOTE — ED Notes (Signed)
Ems [id number]

## 2019-07-04 NOTE — ED Notes (Signed)
Lunch order placed

## 2019-07-04 NOTE — Progress Notes (Signed)
Hd started  

## 2019-07-04 NOTE — ED Provider Notes (Signed)
[facility] Emergency Department Provider Note   ____________________________________________   First MD Initiated Contact with Patient 07/04/19 [phone]     (approximate)  I have reviewed the triage vital signs and the nursing notes.   HISTORY  Chief Complaint Chest Pain and Shortness of Breath    HPI [name] is a 63 y.o. male who comes in reporting he is switching dialysis center and has not had dialysis for 10 days.  He reports he got pain across his chest starting last night which is worse with palpation.  Review of his old records show he had dialysis on 12 4 at [facility] and 1211 here and apparently has not had any dialysis outside of those times.         Past Medical History:  Diagnosis Date  . Depression   . Heart attack ([place])   . Hypertension   . PE (pulmonary embolism)   . Renal disorder     Patient Active Problem List   Diagnosis Date Noted  . Fluid overload 06/21/2019  . Volume overload 11/15/2018  . ESRD (end stage renal disease) on dialysis ([place]) 11/14/2018  . HCAP (healthcare-associated pneumonia) 08/27/2018  . Shortness of breath 08/25/2018  . Chest pain 08/20/2018  . ESRD on dialysis ([place]) 11/08/2016  . Prostate cancer screening 11/08/2016  . Acquired cyst of kidney 11/08/2016  . Urinary urgency 11/08/2016  . Acute on chronic renal failure ([place]) 09/23/2016  . Hypertensive urgency 09/21/2016  . Dysthymia 02/26/2016  . Chronic pain 02/26/2016  . Noncompliance 02/26/2016  . Opiate abuse, continuous ([place]) 02/03/2016  . Substance induced mood disorder ([place]) 02/03/2016  . Antisocial personality disorder ([place]) 02/03/2016  . Left-sided weakness 01/18/2016  . HEPATITIS C 06/28/2008  . HLD (hyperlipidemia) 06/28/2008  . SUBSTANCE ABUSE, MULTIPLE 06/28/2008  . Hypertension 06/27/2008    Past Surgical History:  Procedure Laterality Date  . TOTAL HIP ARTHROPLASTY      Prior to Admission medications   Medication Sig Start  Date End Date Taking? Authorizing Provider  albuterol (VENTOLIN HFA) 108 (90 Base) MCG/ACT inhaler Inhale 2 puffs into the lungs every 6 (six) hours as needed for wheezing or shortness of breath. 06/23/19   Jennye Boroughs, MD  amLODipine (NORVASC) 10 MG tablet Take 1 tablet (10 mg total) by mouth daily. 06/23/19 06/22/20  Jennye Boroughs, MD  apixaban (ELIQUIS) 2.5 MG TABS tablet Take 1 tablet (2.5 mg total) by mouth 2 (two) times daily. 06/23/19   Jennye Boroughs, MD  atorvastatin (LIPITOR) 40 MG tablet Take 1 tablet (40 mg total) by mouth at bedtime. 06/23/19 06/22/20  Jennye Boroughs, MD  hydrALAZINE (APRESOLINE) 100 MG tablet Take 1 tablet (100 mg total) by mouth 3 (three) times daily. 06/23/19   Jennye Boroughs, MD  irbesartan (AVAPRO) 75 MG tablet Take 1 tablet (75 mg total) by mouth at bedtime. 06/23/19   Jennye Boroughs, MD  isosorbide mononitrate (IMDUR) 60 MG 24 hr tablet Take 1 tablet (60 mg total) by mouth daily. 06/23/19   Jennye Boroughs, MD  oxyCODONE (OXY IR/ROXICODONE) 5 MG immediate release tablet Take 5 mg by mouth 2 (two) times daily as needed for severe pain.    [provider]  oxyCODONE (OXYCONTIN) 20 mg 12 hr tablet Take 20 mg by mouth every 12 (twelve) hours.    [provider]  sevelamer carbonate (RENVELA) 800 MG tablet Take 1 tablet (800 mg total) by mouth 3 (three) times daily with meals. 06/23/19   Jennye Boroughs, MD  tamsulosin (FLOMAX) 0.4 MG CAPS capsule Take 1 capsule (0.4 mg total) by mouth daily. 09/27/16   Gladstone Lighter, MD    Allergies Cyclobenzaprine, Clonidine, Furosemide, and Tylenol [acetaminophen]  No family history on file.  Social History Social History   Tobacco Use  . Smoking status: Current Every Day Smoker    Packs/day: 0.50    Types: Cigarettes  . Smokeless tobacco: Never Used  Substance Use Topics  . Alcohol use: No  . Drug use: No    Comment: pt has been positive for cocaine twice this week but denies use    Review  of Systems  Constitutional: No fever/chills Eyes: No visual changes. ENT: No sore throat. Cardiovascular:  chest pain. Respiratory: shortness of breath. Gastrointestinal: No abdominal pain.  No nausea, no vomiting.  No diarrhea.  No constipation. Genitourinary: Negative for dysuria. Musculoskeletal: Negative for back pain. Skin: Negative for rash. Neurological: Negative for headaches, focal weakness  ____________________________________________   PHYSICAL EXAM:  VITAL SIGNS: ED Triage Vitals [07/04/19 0833]  Enc Vitals Group     BP      Pulse      Resp      Temp      Temp src      SpO2      Weight      Height      Head Circumference      Peak Flow      Pain Score 6     Pain Loc      Pain Edu?      Excl. in [place]?     Constitutional: Alert and oriented.  Sleepy but arousable and complaining of chest pain Eyes: Conjunctivae are normal.  Head: Atraumatic. Nose: No congestion/rhinnorhea. Mouth/Throat: Mucous membranes are moist.  Oropharynx non-erythematous. Neck: No stridor Cardiovascular: Normal rate, regular rhythm. Grossly normal heart sounds.  Good peripheral circulation. Respiratory: Normal respiratory effort.  No retractions. Lungs CTAB. Gastrointestinal: Soft and nontender. No distention. No abdominal bruits. No CVA tenderness. Musculoskeletal: No lower extremity tenderness trace bilateral edema.  Neurologic:  Normal speech and language. No gross focal neurologic deficits are appreciated.  Skin:  Skin is warm, dry and intact. No rash noted.   ____________________________________________   LABS (all labs ordered are listed, but only abnormal results are displayed)  Labs Reviewed  COMPREHENSIVE METABOLIC PANEL - Abnormal; Notable for the following components:      Result Value   BUN 73 (*)    Creatinine, Ser 11.35 (*)    Calcium 8.3 (*)    GFR calc non Af Amer 4 (*)    GFR calc Af Amer 5 (*)    All other components within normal limits  CBC WITH  DIFFERENTIAL/PLATELET - Abnormal; Notable for the following components:   RBC 3.29 (*)    Hemoglobin 9.7 (*)    HCT 28.0 (*)    Platelets 102 (*)    All other components within normal limits  BRAIN NATRIURETIC PEPTIDE - Abnormal; Notable for the following components:   B Natriuretic Peptide 4,024.0 (*)    All other components within normal limits  TROPONIN I (HIGH SENSITIVITY) - Abnormal; Notable for the following components:   Troponin I (High Sensitivity) 70 (*)    All other components within normal limits  FIBRIN DERIVATIVES D-DIMER ([facility] ONLY)  TROPONIN I (HIGH SENSITIVITY)   ____________________________________________  EKG EKG read interpreted by me shows normal sinus rhythm 70 normal axis by my reading somewhat delayed R wave progression which may be due  to lead placement flipped T in aVL this is similar to prior.  R wave progression is also similar to prior.  ____________________________________________  RADIOLOGY  ED MD interpretation: Chest x-ray read by radiology reviewed by me shows pulmonary vascular congestion and cardiomegaly.  Official radiology report(s): DG Chest Portable 1 View  Result Date: 07/04/2019 CLINICAL DATA:  Chest pain and shortness of breath EXAM: PORTABLE CHEST 1 VIEW COMPARISON:  June 21, 2019 FINDINGS: There is no edema or consolidation. There is cardiomegaly with mild pulmonary venous hypertension. No adenopathy. No bone lesions. IMPRESSION: Cardiomegaly with pulmonary vascular congestion. No edema or consolidation. Electronically Signed   By: Lowella Grip III M.D.   On: 07/04/2019 08:59    ____________________________________________   PROCEDURES  Procedure(s) performed (including Critical Care):  Procedures   ____________________________________________   INITIAL IMPRESSION / ASSESSMENT AND PLAN / ED COURSE  Patient's O2 sats remained at 97% at 1025.  Patient says he is feeling a little bit better. Patient still having some  chest pain and shortness of breath though.  Chest x-ray indicates CHF.  His potassium is 5.  Because he is having pleuritic chest pain and has missed several doses of his apixaban I feel it would be best to do a CT angio on him unfortunately this requires him to get dialyzed again.  I discussed him with Dr. Juleen China.  We will get him admitted get his CT done get him dialyzed and hopefully let him go.  He has been set up with dialysis from his last admission for this problem.             ____________________________________________   FINAL CLINICAL IMPRESSION(S) / ED DIAGNOSES  Final diagnoses:  Chest pain, unspecified type  Dyspnea, unspecified type  Congestive heart failure, unspecified HF chronicity, unspecified heart failure type ([place])  Noncompliance with renal dialysis [facility])     ED Discharge Orders    None       Note:  This document was prepared using Dragon voice recognition software and may include unintentional dictation errors.    Nena Polio, MD 07/04/19 1030

## 2019-07-04 NOTE — H&P (Signed)
History and Physical    [name] [id number] DOB: 25-Dec-1955 DOA: 07/04/2019  PCP: Patient, No Pcp Per   Patient coming from: Home  I have personally briefly reviewed patient's old medical records in [place]  Chief Complaint: Chest Pain and Shortness of Breath.  HPI: [name] is a 63 y.o. male with medical history significant of ESRD ([place]), PE, HTN, CAD, HCV (cured) with advanced fibrosis and substance use disorder, crack cocaine presented to ED with complaint of shortness of breath and chest pain. Patient recently moved from [place] few weeks ago stating that he moved here as some family member will donate kidney and has not established dialysis yet.  Recent admission 2 weeks ago with a similar complaints which resolved with dialysis.  Last dialysis was done on 06/21/2019 at [facility] before discharge.  Arrangement was made to getting dialysis at [facility], apparently did not get dialysis for the past 10 days. According to patient he was trying to switch his dialysis center and unable to obtain place at his required place. He developed worsening shortness of breath few days ago requiring coming to ED today when he was unable to walk short distance. He denies any fever or chills, no nausea, vomiting or diarrhea.  No urinary symptoms.  Denies any sick contacts.  Patient snored cocaine, last use was 5 days ago.  ED Course: On presentation he was hemodynamically stable with elevated blood pressure at 167/97.  Labs positive for hemoglobin of 9.7(stable), 5 with elevated BUN/creatinine, BNP above 4000, troponin positive at 70.  Chest x-ray with pulmonary vascular congestion.  EKG without any acute change.  Review of Systems: As per HPI otherwise 10 point review of systems negative.   Past Medical History:  Diagnosis Date  . Depression   . Heart attack ([place])   . Hypertension   . PE (pulmonary embolism)   . Renal disorder     Past Surgical  History:  Procedure Laterality Date  . TOTAL HIP ARTHROPLASTY       reports that he has been smoking cigarettes. He has been smoking about 0.50 packs per day. He has never used smokeless tobacco. He reports that he does not drink alcohol or use drugs.  Allergies  Allergen Reactions  . Cyclobenzaprine Nausea Only  . Clonidine Other (See Comments)    Asymptomatic bradycardia to 38  . Furosemide Other (See Comments)    Caused renal failure  . Tylenol [Acetaminophen] Other (See Comments)    Reaction:  Pt states it bothers his liver    No family history on file.  Prior to Admission medications   Medication Sig Start Date End Date Taking? Authorizing Provider  albuterol (VENTOLIN HFA) 108 (90 Base) MCG/ACT inhaler Inhale 2 puffs into the lungs every 6 (six) hours as needed for wheezing or shortness of breath. 06/23/19   Jennye Boroughs, MD  amLODipine (NORVASC) 10 MG tablet Take 1 tablet (10 mg total) by mouth daily. 06/23/19 06/22/20  Jennye Boroughs, MD  apixaban (ELIQUIS) 2.5 MG TABS tablet Take 1 tablet (2.5 mg total) by mouth 2 (two) times daily. 06/23/19   Jennye Boroughs, MD  atorvastatin (LIPITOR) 40 MG tablet Take 1 tablet (40 mg total) by mouth at bedtime. 06/23/19 06/22/20  Jennye Boroughs, MD  hydrALAZINE (APRESOLINE) 100 MG tablet Take 1 tablet (100 mg total) by mouth 3 (three) times daily. 06/23/19   Jennye Boroughs, MD  irbesartan (AVAPRO) 75 MG tablet Take 1 tablet (75 mg total)  by mouth at bedtime. 06/23/19   Jennye Boroughs, MD  isosorbide mononitrate (IMDUR) 60 MG 24 hr tablet Take 1 tablet (60 mg total) by mouth daily. 06/23/19   Jennye Boroughs, MD  oxyCODONE (OXY IR/ROXICODONE) 5 MG immediate release tablet Take 5 mg by mouth 2 (two) times daily as needed for severe pain.    [provider]  oxyCODONE (OXYCONTIN) 20 mg 12 hr tablet Take 20 mg by mouth every 12 (twelve) hours.    [provider]  sevelamer carbonate (RENVELA) 800 MG tablet Take 1 tablet (800  mg total) by mouth 3 (three) times daily with meals. 06/23/19   Jennye Boroughs, MD  tamsulosin (FLOMAX) 0.4 MG CAPS capsule Take 1 capsule (0.4 mg total) by mouth daily. 09/27/16   Gladstone Lighter, MD    Physical Exam: Vitals:   07/04/19 0834 07/04/19 0836 07/04/19 0930  BP:  (!) 156/92 (!) 167/97  Pulse:  72 74  Resp:  20 16  Temp:  97.9 F (36.6 C)   TempSrc:  Oral   SpO2:  99% 98%  Weight: 108.9 kg    Height: 6' (1.829 m)      Vitals:   07/04/19 0834 07/04/19 0836 07/04/19 0930  BP:  (!) 156/92 (!) 167/97  Pulse:  72 74  Resp:  20 16  Temp:  97.9 F (36.6 C)   TempSrc:  Oral   SpO2:  99% 98%  Weight: 108.9 kg    Height: 6' (1.829 m)     General: Vital signs reviewed.  Patient is well-developed and well-nourished, in no acute distress and cooperative with exam.  Head: Normocephalic and atraumatic. Eyes: EOMI, conjunctivae normal, no scleral icterus.  ENMT: Mucous membranes are moist. Posterior pharynx clear of any exudate or lesions.Normal dentition.  Neck: Supple, trachea midline, normal ROM,  Cardiovascular: RRR, S1 normal, S2 normal, no murmurs, gallops, or rubs. Pulmonary/Chest: Bilateral crackles, no wheeze. Abdominal: Soft, non-tender, non-distended, BS +, no masses, organomegaly, or guarding present.  Musculoskeletal: No joint deformities, erythema, or stiffness, ROM full and nontender. Extremities: No lower extremity edema bilaterally,  pulses symmetric and intact bilaterally. No cyanosis or clubbing. Neurological: A&O x3, Strength is normal and symmetric bilaterally, cranial nerve II-XII are grossly intact, no focal motor deficit, sensory intact to light touch bilaterally.  Skin: Warm, dry and intact. No rashes or erythema. Psychiatric: Normal mood and affect. speech and behavior is normal. Cognition and memory are normal.   Labs on Admission: I have personally reviewed following labs and imaging studies  CBC: Recent Labs  Lab 07/04/19 0846  WBC 5.5    NEUTROABS 3.5  HGB 9.7*  HCT 28.0*  MCV 85.1  PLT [id number]*   Basic Metabolic Panel: Recent Labs  Lab 07/04/19 0846  NA 141  K 5.0  CL 104  CO2 23  GLUCOSE 93  BUN 73*  CREATININE 11.35*  CALCIUM 8.3*   GFR: Estimated Creatinine Clearance: 8.7 mL/min (A) (by C-G formula based on SCr of 11.35 mg/dL (H)). Liver Function Tests: Recent Labs  Lab 07/04/19 0846  AST 20  ALT 25  ALKPHOS 45  BILITOT 1.0  PROT 6.5  ALBUMIN 3.5   No results for input(s): LIPASE, AMYLASE in the last 168 hours. No results for input(s): AMMONIA in the last 168 hours. Coagulation Profile: No results for input(s): INR, PROTIME in the last 168 hours. Cardiac Enzymes: No results for input(s): CKTOTAL, CKMB, CKMBINDEX, TROPONINI in the last 168 hours. BNP (last 3 results) No results for  input(s): PROBNP in the last 8760 hours. HbA1C: No results for input(s): HGBA1C in the last 72 hours. CBG: No results for input(s): GLUCAP in the last 168 hours. Lipid Profile: No results for input(s): CHOL, HDL, LDLCALC, TRIG, CHOLHDL, LDLDIRECT in the last 72 hours. Thyroid Function Tests: No results for input(s): TSH, T4TOTAL, FREET4, T3FREE, THYROIDAB in the last 72 hours. Anemia Panel: No results for input(s): VITAMINB12, FOLATE, FERRITIN, TIBC, IRON, RETICCTPCT in the last 72 hours. Urine analysis:    Component Value Date/Time   COLORURINE YELLOW (A) 08/20/2018 0230   APPEARANCEUR CLOUDY (A) 08/20/2018 0230   APPEARANCEUR Clear 11/08/2014 1559   LABSPEC 1.010 08/20/2018 0230   LABSPEC 1.010 11/08/2014 1559   PHURINE 9.0 (H) 08/20/2018 0230   GLUCOSEU NEGATIVE 08/20/2018 0230   GLUCOSEU Negative 11/08/2014 1559   HGBUR NEGATIVE 08/20/2018 0230   BILIRUBINUR NEGATIVE 08/20/2018 0230   BILIRUBINUR Negative 11/08/2014 1559   KETONESUR NEGATIVE 08/20/2018 0230   PROTEINUR 100 (A) 08/20/2018 0230   UROBILINOGEN 1.0 10/28/2010 1100   NITRITE POSITIVE (A) 08/20/2018 0230   LEUKOCYTESUR LARGE (A) 08/20/2018  0230   LEUKOCYTESUR Negative 11/08/2014 1559    Radiological Exams on Admission: DG Chest Portable 1 View  Result Date: 07/04/2019 CLINICAL DATA:  Chest pain and shortness of breath EXAM: PORTABLE CHEST 1 VIEW COMPARISON:  June 21, 2019 FINDINGS: There is no edema or consolidation. There is cardiomegaly with mild pulmonary venous hypertension. No adenopathy. No bone lesions. IMPRESSION: Cardiomegaly with pulmonary vascular congestion. No edema or consolidation. Electronically Signed   By: Lowella Grip III M.D.   On: 07/04/2019 08:59    EKG: Independently reviewed.  Sinus rhythm with poor R wave progression and some baseline wander.  No acute changes.  Assessment/Plan Active Problems:   * No active hospital problems. *   Shortness of breath with ESRD. Appears volume overload secondary to missing dialysis for the past 10 days. -Nephrology was consulted and he will get his dialysis today.  Hypertension.  Elevated blood pressure, not using home meds. -Continue home amlodipine, irbesartan and hydralazine.  History of PE.  Elevated D-dimer above 1000.  Patient was not taking his apixaban regularly, CTA was done which was negative for acute PE. -Continue Eliquis.  BPH.  Continue Flomax.  Cocaine use.  Patient snorted cocaine, last use was 5 days ago. Counseled regarding quitting as he is looking for renal transplant.  DVT prophylaxis: Eliquis Code Status: Full code Family Communication: No family at bedside Disposition Plan: Pending improvement Consults called: Nephrology Admission status: Inpatient   Lorella Nimrod MD [facility] Pager [phone]  If 7PM-7AM, please contact night-coverage [email or web address] Password [facility]  07/04/2019, 10:39 AM   This record has been created using Dragon voice recognition software. Errors have been sought and corrected,but may not always be located. Such creation errors do not reflect on the standard of care.

## 2019-07-04 NOTE — ED Triage Notes (Signed)
Chest pain and sob since yesterday.  Per ems given 325 mg aspirin.  Has been a week since dialyiss

## 2019-07-05 ENCOUNTER — Inpatient Hospital Stay: Payer: Medicare Other

## 2019-07-05 DIAGNOSIS — F1994 Other psychoactive substance use, unspecified with psychoactive substance-induced mood disorder: Secondary | ICD-10-CM

## 2019-07-05 DIAGNOSIS — N186 End stage renal disease: Secondary | ICD-10-CM

## 2019-07-05 DIAGNOSIS — R079 Chest pain, unspecified: Secondary | ICD-10-CM

## 2019-07-05 DIAGNOSIS — I151 Hypertension secondary to other renal disorders: Secondary | ICD-10-CM

## 2019-07-05 DIAGNOSIS — E785 Hyperlipidemia, unspecified: Secondary | ICD-10-CM

## 2019-07-05 DIAGNOSIS — N2889 Other specified disorders of kidney and ureter: Secondary | ICD-10-CM

## 2019-07-05 DIAGNOSIS — Z992 Dependence on renal dialysis: Secondary | ICD-10-CM

## 2019-07-05 LAB — BASIC METABOLIC PANEL
Anion gap: 13 (ref 5–15)
BUN: 43 mg/dL — ABNORMAL HIGH (ref 8–23)
CO2: 28 mmol/L (ref 22–32)
Calcium: 8.3 mg/dL — ABNORMAL LOW (ref 8.9–10.3)
Chloride: 97 mmol/L — ABNORMAL LOW (ref 98–111)
Creatinine, Ser: 8.23 mg/dL — ABNORMAL HIGH (ref 0.61–1.24)
GFR calc Af Amer: 7 mL/min — ABNORMAL LOW (ref >60)
GFR calc non Af Amer: 6 mL/min — ABNORMAL LOW (ref >60)
Glucose, Bld: 98 mg/dL (ref 70–99)
Potassium: 4.3 mmol/L (ref 3.5–5.1)
Sodium: 138 mmol/L (ref 135–145)

## 2019-07-05 LAB — TROPONIN I (HIGH SENSITIVITY)
Troponin I (High Sensitivity): 64 ng/L — ABNORMAL HIGH (ref <18)
Troponin I (High Sensitivity): 65 ng/L — ABNORMAL HIGH (ref <18)

## 2019-07-05 LAB — MRSA PCR SCREENING: MRSA by PCR: NEGATIVE

## 2019-07-05 MED ORDER — MORPHINE SULFATE (PF) 2 MG/ML IV SOLN
1.0000 mg | INTRAVENOUS | Status: AC | PRN
  Administered 2019-07-05 (×3): 1 mg via INTRAVENOUS
  Filled 2019-07-05 (×3): qty 1

## 2019-07-05 NOTE — Progress Notes (Signed)
PROGRESS NOTE    [name]  [id number] DOB: Feb 10, 1956 DOA: 07/04/2019 PCP: Patient, No Pcp Per   Brief Narrative:  63 year old with history of ESRD TTS, PE, HTN, HCV, CAD, advanced fibrosis, substance abuse disorder-crack cocaine admitted for shortness of breath and chest pain.  Recently moved from [place] in anticipation that some of his family member may donate a kidney to him but has not established dialysis locally.  Last HD here on 06/21/2019 and arrangements were made at [facility] but patient did not go.  Upon admission he had signs of fluid overload.   Assessment & Plan:   Principal Problem:   Volume overload Active Problems:   HLD (hyperlipidemia)   Hypertension   Substance induced mood disorder (HCC)   Antisocial personality disorder ([place])   Noncompliance with renal dialysis ([place])   ESRD on dialysis ([place])   Dyspnea   ESRD (end stage renal disease) on dialysis (HCC)   Congestive heart failure (HCC)  Acute respiratory distress secondary to volume overload ESRD on hemodialysis TTS, noncompliance Secondary hyperparathyroidism -Nephrology consulted for appropriate dialysis sessions.  Already has outpatient clinic at [facility] on [address] -Monitor electrolytes  Essential hypertension -Amlodipine, irbesartan and hydralazine  History of pulmonary embolism -Unknown how long ago was this.  Not taking Eliquis, CTA was negative for acute PE  Anemia of chronic disease -Secondary to renal dysfunction.  History of BPH -Flomax  Substance abuse -Counseled to quit using this.    DVT prophylaxis: Eliquis Code Status: Full code Family Communication: None at bedside Disposition Plan: Maintain hospital stay for dialysis in the meantime we will arrange for securing outpatient dialysis.  Nephrology team following.  Consultants:   Nephrology  Procedures:   None  Antimicrobials:   None   Subjective: Feels okay, does  not any complaints at the moment but has mild exertional dyspnea.  Tells me there were some scheduling conflict as an outpatient at his dialysis center and therefore unable to make it.  Review of Systems Otherwise negative except as per HPI, including: General: Denies fever, chills, night sweats or unintended weight loss. Resp: Denies cough, wheezing, shortness of breath. Cardiac: Denies chest pain, palpitations, orthopnea, paroxysmal nocturnal dyspnea. GI: Denies abdominal pain, nausea, vomiting, diarrhea or constipation GU: Denies dysuria, frequency, hesitancy or incontinence MS: Denies muscle aches, joint pain or swelling Neuro: Denies headache, neurologic deficits (focal weakness, numbness, tingling), abnormal gait Psych: Denies anxiety, depression, SI/HI/AVH Skin: Denies new rashes or lesions ID: Denies sick contacts, exotic exposures, travel  Objective: Vitals:   07/04/19 1815 07/04/19 1855 07/04/19 2005 07/05/19 0451  BP: (!) 173/104 (!) 168/97 (!) 141/93 140/78  Pulse: 82 72 87 80  Resp: 20 19 20 20   Temp:  97.7 F (36.5 C) 98.3 F (36.8 C) 98.4 F (36.9 C)  TempSrc:  Oral Oral Oral  SpO2:  99% 95% 93%  Weight:      Height:        Intake/Output Summary (Last 24 hours) at 07/05/2019 0734 Last data filed at 07/05/2019 0251 Gross per 24 hour  Intake 236 ml  Output 2300 ml  Net -2064 ml   Filed Weights   07/04/19 0834  Weight: 108.9 kg    Examination:  General exam: Appears calm and comfortable  Respiratory system: Some rhonchi at the bases Cardiovascular system: S1 & S2 heard, RRR. No JVD, murmurs, rubs, gallops or clicks.  2+ bilateral lower extremity pitting edema Gastrointestinal system: Abdomen is nondistended, soft and nontender. No organomegaly  or masses felt. Normal bowel sounds heard. Central nervous system: Alert and oriented. No focal neurological deficits. Extremities: Symmetric 5 x 5 power.  Left upper extremity AV fistula in place Skin: No rashes,  lesions or ulcers Psychiatry: Judgement and insight appear normal. Mood & affect appropriate.     Data Reviewed:   CBC: Recent Labs  Lab 07/04/19 0846  WBC 5.5  NEUTROABS 3.5  HGB 9.7*  HCT 28.0*  MCV 85.1  PLT [id number]*   Basic Metabolic Panel: Recent Labs  Lab 07/04/19 0846 07/05/19 0443  NA 141 138  K 5.0 4.3  CL 104 97*  CO2 23 28  GLUCOSE 93 98  BUN 73* 43*  CREATININE 11.35* 8.23*  CALCIUM 8.3* 8.3*   GFR: Estimated Creatinine Clearance: 12 mL/min (A) (by C-G formula based on SCr of 8.23 mg/dL (H)). Liver Function Tests: Recent Labs  Lab 07/04/19 0846  AST 20  ALT 25  ALKPHOS 45  BILITOT 1.0  PROT 6.5  ALBUMIN 3.5   No results for input(s): LIPASE, AMYLASE in the last 168 hours. No results for input(s): AMMONIA in the last 168 hours. Coagulation Profile: No results for input(s): INR, PROTIME in the last 168 hours. Cardiac Enzymes: No results for input(s): CKTOTAL, CKMB, CKMBINDEX, TROPONINI in the last 168 hours. BNP (last 3 results) No results for input(s): PROBNP in the last 8760 hours. HbA1C: No results for input(s): HGBA1C in the last 72 hours. CBG: No results for input(s): GLUCAP in the last 168 hours. Lipid Profile: No results for input(s): CHOL, HDL, LDLCALC, TRIG, CHOLHDL, LDLDIRECT in the last 72 hours. Thyroid Function Tests: No results for input(s): TSH, T4TOTAL, FREET4, T3FREE, THYROIDAB in the last 72 hours. Anemia Panel: No results for input(s): VITAMINB12, FOLATE, FERRITIN, TIBC, IRON, RETICCTPCT in the last 72 hours. Sepsis Labs: No results for input(s): PROCALCITON, LATICACIDVEN in the last 168 hours.  Recent Results (from the past 240 hour(s))  Respiratory Panel by RT PCR (Flu A&B, Covid) - Nasopharyngeal Swab     Status: None   Collection Time: 07/04/19 12:09 PM   Specimen: Nasopharyngeal Swab  Result Value Ref Range Status   SARS Coronavirus 2 by RT PCR NEGATIVE NEGATIVE Final    Comment: (NOTE) SARS-CoV-2 target nucleic  acids are NOT DETECTED. The SARS-CoV-2 RNA is generally detectable in upper respiratoy specimens during the acute phase of infection. The lowest concentration of SARS-CoV-2 viral copies this assay can detect is 131 copies/mL. A negative result does not preclude SARS-Cov-2 infection and should not be used as the sole basis for treatment or other patient management decisions. A negative result may occur with  improper specimen collection/handling, submission of specimen other than nasopharyngeal swab, presence of viral mutation(s) within the areas targeted by this assay, and inadequate number of viral copies (<131 copies/mL). A negative result must be combined with clinical observations, patient history, and epidemiological information. The expected result is Negative. Fact Sheet for Patients:  PinkCheek.be Fact Sheet for Healthcare Providers:  GravelBags.it This test is not yet ap proved or cleared by the [place] FDA and  has been authorized for detection and/or diagnosis of SARS-CoV-2 by FDA under an Emergency Use Authorization (EUA). This EUA will remain  in effect (meaning this test can be used) for the duration of the COVID-19 declaration under Section 564(b)(1) of the Act, 21 U.S.C. section 360bbb-3(b)(1), unless the authorization is terminated or revoked sooner.    Influenza A by PCR NEGATIVE NEGATIVE Final   Influenza B by PCR  NEGATIVE NEGATIVE Final    Comment: (NOTE) The Xpert Xpress SARS-CoV-2/FLU/RSV assay is intended as an aid in  the diagnosis of influenza from Nasopharyngeal swab specimens and  should not be used as a sole basis for treatment. Nasal washings and  aspirates are unacceptable for Xpert Xpress SARS-CoV-2/FLU/RSV  testing. Fact Sheet for Patients: PinkCheek.be Fact Sheet for Healthcare Providers: GravelBags.it This test is not yet  approved or cleared by the [place] FDA and  has been authorized for detection and/or diagnosis of SARS-CoV-2 by  FDA under an Emergency Use Authorization (EUA). This EUA will remain  in effect (meaning this test can be used) for the duration of the  Covid-19 declaration under Section 564(b)(1) of the Act, 21  U.S.C. section 360bbb-3(b)(1), unless the authorization is  terminated or revoked. Performed at [facility], [address] [place], [place] [address]   MRSA PCR Screening     Status: None   Collection Time: 07/05/19  2:46 AM   Specimen: Nasal Mucosa; Nasopharyngeal  Result Value Ref Range Status   MRSA by PCR NEGATIVE NEGATIVE Final    Comment:        The GeneXpert MRSA Assay (FDA approved for NASAL specimens only), is one component of a comprehensive MRSA colonization surveillance program. It is not intended to diagnose MRSA infection nor to guide or monitor treatment for MRSA infections. Performed at [facility], [address] [place], [place] [address]          Radiology Studies: CT Angio Chest PE W and/or Wo Contrast  Result Date: 07/04/2019 CLINICAL DATA:  Shortness of breath.  Chest pain.  Renal failure EXAM: CT ANGIOGRAPHY CHEST WITH CONTRAST TECHNIQUE: Multidetector CT imaging of the chest was performed using the standard protocol during bolus administration of intravenous contrast. Multiplanar CT image reconstructions and MIPs were obtained to evaluate the vascular anatomy. CONTRAST:  183mL OMNIPAQUE IOHEXOL 350 MG/ML SOLN COMPARISON:  Chest radiograph July 04, 2019 chest CT angiogram August 27, 2018 FINDINGS: Cardiovascular: There is no demonstrable pulmonary embolus. The ascending thoracic aortic diameter measures 4.3 x 4.3 cm. There is no evident thoracic aortic dissection. Visualized great vessels show scattered foci of arterial vascular calcification. There is aortic atherosclerosis. There are foci of coronary artery  calcification. Heart is mildly enlarged in a generalized manner. There is no pericardial effusion or pericardial thickening. The main pulmonary outflow tract is enlarged, measuring 3.9 cm in diameter. Mediastinum/Nodes: Visualized thyroid appears unremarkable. There are scattered subcentimeter mediastinal lymph nodes. There is lymph node enlargement in the subcarinal region with the largest lymph node in this area measuring 1.8 x 1.6 cm. There is a pretracheal lymph node measuring 1.6 x 1.4 cm. There is an aortopulmonary window lymph node measuring 1.5 x 1.4 cm. No esophageal lesions are evident. Lungs/Pleura: There is atelectatic change in the right base and in the right upper lobe near the apex. There is a stable small bulla in the lateral segment left lower lobe. There is no edema or consolidation. No pleural effusions are evident. Upper Abdomen: There is aortic atherosclerosis in the upper abdominal aorta. Adrenals appear somewhat hypertrophied bilaterally. Visualized upper abdominal structures otherwise appear unremarkable. Musculoskeletal: No blastic or lytic bone lesions evident. No chest wall lesions appreciable. Review of the MIP images confirms the above findings. IMPRESSION: 1.  No appreciable pulmonary embolus. 2. Ascending thoracic aortic prominence with ascending thoracic aorta measuring 4.3 x 4.3 cm in transverse diameter. No dissection evident. There are foci of aortic atherosclerosis  as well as foci of great vessel and coronary artery calcification. Recommend annual imaging followup by CTA or MRA. This recommendation follows 2010 ACCF/AHA/AATS/ACR/ASA/SCA/SCAI/SIR/STS/SVM Guidelines for the Diagnosis and Management of Patients with Thoracic Aortic Disease. Circulation. 2010; [id number]. Aortic aneurysm NOS (ICD10-I71.9). 3. Enlargement of the main pulmonary outflow tract, a finding indicative of pulmonary arterial hypertension. 4.  Areas of atelectatic change.  No edema or consolidation. 5.   Several enlarged lymph nodes of uncertain etiology. 6.  Bilateral adrenal hypertrophy of uncertain etiology. Aortic Atherosclerosis (ICD10-I70.0). Electronically Signed   By: Lowella Grip III M.D.   On: 07/04/2019 11:30   DG Chest Portable 1 View  Result Date: 07/04/2019 CLINICAL DATA:  Chest pain and shortness of breath EXAM: PORTABLE CHEST 1 VIEW COMPARISON:  June 21, 2019 FINDINGS: There is no edema or consolidation. There is cardiomegaly with mild pulmonary venous hypertension. No adenopathy. No bone lesions. IMPRESSION: Cardiomegaly with pulmonary vascular congestion. No edema or consolidation. Electronically Signed   By: Lowella Grip III M.D.   On: 07/04/2019 08:59        Scheduled Meds: . amLODipine  10 mg Oral Daily  . apixaban  2.5 mg Oral BID  . atorvastatin  40 mg Oral QHS  . ferric citrate  210 mg Oral TID WC  . hydrALAZINE  100 mg Oral TID  . irbesartan  75 mg Oral QHS  . isosorbide mononitrate  60 mg Oral Daily  . oxyCODONE  20 mg Oral Q12H  . sevelamer carbonate  800 mg Oral TID WC  . sodium chloride flush  3 mL Intravenous Q12H  . tamsulosin  0.4 mg Oral Daily   Continuous Infusions:   LOS: 1 day   Time spent= 30 mins    Taiwan Talcott Arsenio Loader, MD [facility]  If 7PM-7AM, please contact night-coverage  07/05/2019, 7:34 AM

## 2019-07-05 NOTE — Progress Notes (Signed)
[facility]  ROUNDING NOTE   Subjective:   Hemodialysis treatment yesterday. Tolerated treatment well. UF of 2 liters.   Complains of chest pain this morning.   Objective:  Vital signs in last 24 hours:  Temp:  [97.7 F (36.5 C)-98.4 F (36.9 C)] 98.4 F (36.9 C) (12/25 0451) Pulse Rate:  [70-93] 80 (12/25 0451) Resp:  [16-26] 20 (12/25 0451) BP: (140-203)/(78-119) 140/78 (12/25 0451) SpO2:  [93 %-100 %] 93 % (12/25 0451)  Weight change:  Filed Weights   07/04/19 0834  Weight: 108.9 kg    Intake/Output: I/O last 3 completed shifts: In: 236 [P.O.:236] Out: 2300 [Urine:300; Other:2000]   Intake/Output this shift:  No intake/output data recorded.  Physical Exam: General: NAD,   Head: Normocephalic, atraumatic. Moist oral mucosal membranes  Eyes: Anicteric, PERRL  Neck: Supple, trachea midline  Chest Tenderness with palpation  Lungs:  Crackles at bases  Heart: Regular rate and rhythm  Abdomen:  Soft, nontender,   Extremities:  + peripheral edema.  Neurologic: Nonfocal, moving all four extremities  Skin: No lesions  Access: Left AVF    Basic Metabolic Panel: Recent Labs  Lab 07/04/19 0846 07/05/19 0443  NA 141 138  K 5.0 4.3  CL 104 97*  CO2 23 28  GLUCOSE 93 98  BUN 73* 43*  CREATININE 11.35* 8.23*  CALCIUM 8.3* 8.3*    Liver Function Tests: Recent Labs  Lab 07/04/19 0846  AST 20  ALT 25  ALKPHOS 45  BILITOT 1.0  PROT 6.5  ALBUMIN 3.5   No results for input(s): LIPASE, AMYLASE in the last 168 hours. No results for input(s): AMMONIA in the last 168 hours.  CBC: Recent Labs  Lab 07/04/19 0846  WBC 5.5  NEUTROABS 3.5  HGB 9.7*  HCT 28.0*  MCV 85.1  PLT 102*    Cardiac Enzymes: No results for input(s): CKTOTAL, CKMB, CKMBINDEX, TROPONINI in the last 168 hours.  BNP: Invalid input(s): POCBNP  CBG: No results for input(s): GLUCAP in the last 168 hours.  Microbiology: Results for orders placed or performed during  the hospital encounter of 07/04/19  Respiratory Panel by RT PCR (Flu A&B, Covid) - Nasopharyngeal Swab     Status: None   Collection Time: 07/04/19 12:09 PM   Specimen: Nasopharyngeal Swab  Result Value Ref Range Status   SARS Coronavirus 2 by RT PCR NEGATIVE NEGATIVE Final    Comment: (NOTE) SARS-CoV-2 target nucleic acids are NOT DETECTED. The SARS-CoV-2 RNA is generally detectable in upper respiratoy specimens during the acute phase of infection. The lowest concentration of SARS-CoV-2 viral copies this assay can detect is 131 copies/mL. A negative result does not preclude SARS-Cov-2 infection and should not be used as the sole basis for treatment or other patient management decisions. A negative result may occur with  improper specimen collection/handling, submission of specimen other than nasopharyngeal swab, presence of viral mutation(s) within the areas targeted by this assay, and inadequate number of viral copies (<131 copies/mL). A negative result must be combined with clinical observations, patient history, and epidemiological information. The expected result is Negative. Fact Sheet for Patients:  PinkCheek.be Fact Sheet for Healthcare Providers:  GravelBags.it This test is not yet ap proved or cleared by the [place] FDA and  has been authorized for detection and/or diagnosis of SARS-CoV-2 by FDA under an Emergency Use Authorization (EUA). This EUA will remain  in effect (meaning this test can be used) for the duration of the COVID-19 declaration under Section  564(b)(1) of the Act, 21 U.S.C. section 360bbb-3(b)(1), unless the authorization is terminated or revoked sooner.    Influenza A by PCR NEGATIVE NEGATIVE Final   Influenza B by PCR NEGATIVE NEGATIVE Final    Comment: (NOTE) The Xpert Xpress SARS-CoV-2/FLU/RSV assay is intended as an aid in  the diagnosis of influenza from Nasopharyngeal swab specimens  and  should not be used as a sole basis for treatment. Nasal washings and  aspirates are unacceptable for Xpert Xpress SARS-CoV-2/FLU/RSV  testing. Fact Sheet for Patients: PinkCheek.be Fact Sheet for Healthcare Providers: GravelBags.it This test is not yet approved or cleared by the [place] FDA and  has been authorized for detection and/or diagnosis of SARS-CoV-2 by  FDA under an Emergency Use Authorization (EUA). This EUA will remain  in effect (meaning this test can be used) for the duration of the  Covid-19 declaration under Section 564(b)(1) of the Act, 21  U.S.C. section 360bbb-3(b)(1), unless the authorization is  terminated or revoked. Performed at [facility], [facility], [address] [place], [place] [address]   MRSA PCR Screening     Status: None   Collection Time: 07/05/19  2:46 AM   Specimen: Nasal Mucosa; Nasopharyngeal  Result Value Ref Range Status   MRSA by PCR NEGATIVE NEGATIVE Final    Comment:        The GeneXpert MRSA Assay (FDA approved for NASAL specimens only), is one component of a comprehensive MRSA colonization surveillance program. It is not intended to diagnose MRSA infection nor to guide or monitor treatment for MRSA infections. Performed at [facility], [address] [place], [place] [address]     Coagulation Studies: No results for input(s): LABPROT, INR in the last 72 hours.  Urinalysis: No results for input(s): COLORURINE, LABSPEC, PHURINE, GLUCOSEU, HGBUR, BILIRUBINUR, KETONESUR, PROTEINUR, UROBILINOGEN, NITRITE, LEUKOCYTESUR in the last 72 hours.  Invalid input(s): APPERANCEUR    Imaging: CT Angio Chest PE W and/or Wo Contrast  Result Date: 07/04/2019 CLINICAL DATA:  Shortness of breath.  Chest pain.  Renal failure EXAM: CT ANGIOGRAPHY CHEST WITH CONTRAST TECHNIQUE: Multidetector CT imaging of the chest was performed using the standard protocol during  bolus administration of intravenous contrast. Multiplanar CT image reconstructions and MIPs were obtained to evaluate the vascular anatomy. CONTRAST:  185mL OMNIPAQUE IOHEXOL 350 MG/ML SOLN COMPARISON:  Chest radiograph July 04, 2019 chest CT angiogram August 27, 2018 FINDINGS: Cardiovascular: There is no demonstrable pulmonary embolus. The ascending thoracic aortic diameter measures 4.3 x 4.3 cm. There is no evident thoracic aortic dissection. Visualized great vessels show scattered foci of arterial vascular calcification. There is aortic atherosclerosis. There are foci of coronary artery calcification. Heart is mildly enlarged in a generalized manner. There is no pericardial effusion or pericardial thickening. The main pulmonary outflow tract is enlarged, measuring 3.9 cm in diameter. Mediastinum/Nodes: Visualized thyroid appears unremarkable. There are scattered subcentimeter mediastinal lymph nodes. There is lymph node enlargement in the subcarinal region with the largest lymph node in this area measuring 1.8 x 1.6 cm. There is a pretracheal lymph node measuring 1.6 x 1.4 cm. There is an aortopulmonary window lymph node measuring 1.5 x 1.4 cm. No esophageal lesions are evident. Lungs/Pleura: There is atelectatic change in the right base and in the right upper lobe near the apex. There is a stable small bulla in the lateral segment left lower lobe. There is no edema or consolidation. No pleural effusions are evident. Upper Abdomen: There is aortic atherosclerosis in the upper abdominal  aorta. Adrenals appear somewhat hypertrophied bilaterally. Visualized upper abdominal structures otherwise appear unremarkable. Musculoskeletal: No blastic or lytic bone lesions evident. No chest wall lesions appreciable. Review of the MIP images confirms the above findings. IMPRESSION: 1.  No appreciable pulmonary embolus. 2. Ascending thoracic aortic prominence with ascending thoracic aorta measuring 4.3 x 4.3 cm in  transverse diameter. No dissection evident. There are foci of aortic atherosclerosis as well as foci of great vessel and coronary artery calcification. Recommend annual imaging followup by CTA or MRA. This recommendation follows 2010 ACCF/AHA/AATS/ACR/ASA/SCA/SCAI/SIR/STS/SVM Guidelines for the Diagnosis and Management of Patients with Thoracic Aortic Disease. Circulation. 2010; [id number]. Aortic aneurysm NOS (ICD10-I71.9). 3. Enlargement of the main pulmonary outflow tract, a finding indicative of pulmonary arterial hypertension. 4.  Areas of atelectatic change.  No edema or consolidation. 5.  Several enlarged lymph nodes of uncertain etiology. 6.  Bilateral adrenal hypertrophy of uncertain etiology. Aortic Atherosclerosis (ICD10-I70.0). Electronically Signed   By: Lowella Grip III M.D.   On: 07/04/2019 11:30   DG Chest Portable 1 View  Result Date: 07/04/2019 CLINICAL DATA:  Chest pain and shortness of breath EXAM: PORTABLE CHEST 1 VIEW COMPARISON:  June 21, 2019 FINDINGS: There is no edema or consolidation. There is cardiomegaly with mild pulmonary venous hypertension. No adenopathy. No bone lesions. IMPRESSION: Cardiomegaly with pulmonary vascular congestion. No edema or consolidation. Electronically Signed   By: Lowella Grip III M.D.   On: 07/04/2019 08:59     Medications:    . amLODipine  10 mg Oral Daily  . apixaban  2.5 mg Oral BID  . atorvastatin  40 mg Oral QHS  . ferric citrate  210 mg Oral TID WC  . hydrALAZINE  100 mg Oral TID  . irbesartan  75 mg Oral QHS  . isosorbide mononitrate  60 mg Oral Daily  . oxyCODONE  20 mg Oral Q12H  . sevelamer carbonate  800 mg Oral TID WC  . sodium chloride flush  3 mL Intravenous Q12H  . tamsulosin  0.4 mg Oral Daily   albuterol, ondansetron **OR** ondansetron (ZOFRAN) IV, oxyCODONE, polyethylene glycol  Assessment/ Plan:  [name] is a 63 y.o. black male with end stage renal disease on hemodialysis,  hypertension, depression, coronary disease, hypertension, pulmonary embolism, hepatitis C, history of substance abuse presents to [facility] on 07/04/2019 for Noncompliance with renal dialysis ([place]) [Z91.15] Volume overload [E87.70] Dyspnea, unspecified type [R06.00] Chest pain, unspecified type [R07.9] Congestive heart failure, unspecified HF chronicity, unspecified heart failure type ([place]) [I50.9]  No outpatient dialysis unit.   1. End Stage Renal Disease: Emergent hemodialysis treatment on admission.  Next dialysis for tomorrow.   2. Hypertension: urgency on admission. Now with chest pain. Blood pressure well controlled with resuming home blood pressure regimen.  Concern for cocaine induced hypertension and cocaine induced hypertension - EKG with no changes. Troponins with no change.   3. Anemia of chronic kidney disease: hemoglobin 9.7.  - held EPO due to hypertension.   4. Secondary Hyperparathyroidism:  - sevelamer   LOS: 1 Korey Prashad 12/25/202011:00 AM

## 2019-07-06 DIAGNOSIS — F602 Antisocial personality disorder: Secondary | ICD-10-CM

## 2019-07-06 LAB — CBC
HCT: 28.9 % — ABNORMAL LOW (ref 39.0–52.0)
Hemoglobin: 9.7 g/dL — ABNORMAL LOW (ref 13.0–17.0)
MCH: 29.4 pg (ref 26.0–34.0)
MCHC: 33.6 g/dL (ref 30.0–36.0)
MCV: 87.6 fL (ref 80.0–100.0)
Platelets: 98 10*3/uL — ABNORMAL LOW (ref 150–400)
RBC: 3.3 MIL/uL — ABNORMAL LOW (ref 4.22–5.81)
RDW: 14.9 % (ref 11.5–15.5)
WBC: 4.7 10*3/uL (ref 4.0–10.5)
nRBC: 0 % (ref 0.0–0.2)

## 2019-07-06 LAB — COMPREHENSIVE METABOLIC PANEL
ALT: 17 U/L (ref 0–44)
AST: 13 U/L — ABNORMAL LOW (ref 15–41)
Albumin: 3.4 g/dL — ABNORMAL LOW (ref 3.5–5.0)
Alkaline Phosphatase: 44 U/L (ref 38–126)
Anion gap: 12 (ref 5–15)
BUN: 50 mg/dL — ABNORMAL HIGH (ref 8–23)
CO2: 28 mmol/L (ref 22–32)
Calcium: 8.6 mg/dL — ABNORMAL LOW (ref 8.9–10.3)
Chloride: 99 mmol/L (ref 98–111)
Creatinine, Ser: 9.67 mg/dL — ABNORMAL HIGH (ref 0.61–1.24)
GFR calc Af Amer: 6 mL/min — ABNORMAL LOW (ref >60)
GFR calc non Af Amer: 5 mL/min — ABNORMAL LOW (ref >60)
Glucose, Bld: 95 mg/dL (ref 70–99)
Potassium: 4.2 mmol/L (ref 3.5–5.1)
Sodium: 139 mmol/L (ref 135–145)
Total Bilirubin: 0.9 mg/dL (ref 0.3–1.2)
Total Protein: 6.4 g/dL — ABNORMAL LOW (ref 6.5–8.1)

## 2019-07-06 LAB — MAGNESIUM: Magnesium: 2.3 mg/dL (ref 1.7–2.4)

## 2019-07-06 MED ORDER — EPOETIN ALFA 10000 UNIT/ML IJ SOLN
10000.0000 [IU] | Freq: Once | INTRAMUSCULAR | Status: AC
  Administered 2019-07-06: 10000 [IU] via INTRAVENOUS

## 2019-07-06 NOTE — Progress Notes (Signed)
PROGRESS NOTE    [name]  [id number] DOB: [birth year] DOA: 07/04/2019 PCP: Patient, No Pcp Per   Brief Narrative:  63 year old with history of ESRD TTS, PE, HTN, HCV, CAD, advanced fibrosis, substance abuse disorder-crack cocaine admitted for shortness of breath and chest pain.  Recently moved from [place] in anticipation that some of his family member may donate a kidney to him but has not established dialysis locally.  Last HD here on 06/21/2019 and arrangements were made at [facility] but patient did not go.  Upon admission he had signs of fluid overload.  He was started on dialysis which helped his symptoms.  Also reported of intermittent chest discomfort and had cardiac evaluation which was negative.   Assessment & Plan:   Principal Problem:   Volume overload Active Problems:   HLD (hyperlipidemia)   Hypertension   Substance induced mood disorder (HCC)   Antisocial personality disorder ([place])   Noncompliance with renal dialysis ([place])   ESRD on dialysis ([place])   Dyspnea   ESRD (end stage renal disease) on dialysis (HCC)   Congestive heart failure (HCC)  Acute respiratory distress secondary to volume overload, improved ESRD on hemodialysis TTS, noncompliance Secondary hyperparathyroidism -Nephrology consulted for appropriate dialysis sessions.  Plan for another session today.  Already has outpatient clinic at [facility] on [address] -Monitor electrolytes  Essential hypertension -Amlodipine, irbesartan and hydralazine  History of pulmonary embolism -Unknown how long ago was this.  Not taking Eliquis, CTA was negative for acute PE  Anemia of chronic disease -Secondary to renal dysfunction.  History of BPH -Flomax  Substance abuse -Counseled to quit using this.    DVT prophylaxis: Eliquis Code Status: Full code Family Communication: None at bedside Disposition Plan: Maintain hospital stay until cleared by nephrology, and  his outpatient dialysis as been secured  Consultants:   Nephrology  Procedures:   None  Antimicrobials:   None   Subjective: Had episodes of chest pain yesterday afternoon but it has resolved this morning.  EKG and troponins were negative.  Feels okay at this time.  Review of Systems Otherwise negative except as per HPI, including: General = no fevers, chills, dizziness, malaise, fatigue HEENT/EYES = negative for pain, redness, loss of vision, double vision, blurred vision, loss of hearing, sore throat, hoarseness, dysphagia Cardiovascular= negative for chest pain, palpitation, murmurs, lower extremity swelling Respiratory/lungs= negative for shortness of breath, cough, hemoptysis, wheezing, mucus production Gastrointestinal= negative for nausea, vomiting,, abdominal pain, melena, hematemesis Genitourinary= negative for Dysuria, Hematuria, Change in Urinary Frequency MSK = Negative for arthralgia, myalgias, Back Pain, Joint swelling  Neurology= Negative for headache, seizures, numbness, tingling  Psychiatry= Negative for anxiety, depression, suicidal and homocidal ideation Allergy/Immunology= Medication/Food allergy as listed  Skin= Negative for Rash, lesions, ulcers, itching   Objective: Vitals:   07/06/19 1000 07/06/19 1015 07/06/19 1030 07/06/19 1045  BP: (!) 143/82 (!) 146/89 (!) 142/85 (!) 159/75  Pulse: 75 69 (!) 59 72  Resp: 15 15 15 13   Temp:      TempSrc:      SpO2:      Weight:      Height:        Intake/Output Summary (Last 24 hours) at 07/06/2019 1103 Last data filed at 07/06/2019 0800 Gross per 24 hour  Intake 980 ml  Output 775 ml  Net 205 ml   Filed Weights   07/04/19 0834 07/06/19 0847  Weight: 108.9 kg 110 kg    Examination:  Constitutional: Not  in acute distress Respiratory: Clear to auscultation bilaterally Cardiovascular: Normal sinus rhythm, no rubs Abdomen: Nontender nondistended good bowel sounds Musculoskeletal: No edema  noted Skin: No rashes seen Neurologic: CN 2-12 grossly intact.  And nonfocal Psychiatric: Normal judgment and insight. Alert and oriented x 3. Normal mood.   Left upper extremity AV fistula   Data Reviewed:   CBC: Recent Labs  Lab 07/04/19 0846 07/06/19 0613  WBC 5.5 4.7  NEUTROABS 3.5  --   HGB 9.7* 9.7*  HCT 28.0* 28.9*  MCV 85.1 87.6  PLT 102* 98*   Basic Metabolic Panel: Recent Labs  Lab 07/04/19 0846 07/05/19 0443 07/06/19 0811  NA 141 138 139  K 5.0 4.3 4.2  CL 104 97* 99  CO2 23 28 28   GLUCOSE 93 98 95  BUN 73* 43* 50*  CREATININE 11.35* 8.23* 9.67*  CALCIUM 8.3* 8.3* 8.6*  MG  --   --  2.3   GFR: Estimated Creatinine Clearance: 10.3 mL/min (A) (by C-G formula based on SCr of 9.67 mg/dL (H)). Liver Function Tests: Recent Labs  Lab 07/04/19 0846 07/06/19 0811  AST 20 13*  ALT 25 17  ALKPHOS 45 44  BILITOT 1.0 0.9  PROT 6.5 6.4*  ALBUMIN 3.5 3.4*   No results for input(s): LIPASE, AMYLASE in the last 168 hours. No results for input(s): AMMONIA in the last 168 hours. Coagulation Profile: No results for input(s): INR, PROTIME in the last 168 hours. Cardiac Enzymes: No results for input(s): CKTOTAL, CKMB, CKMBINDEX, TROPONINI in the last 168 hours. BNP (last 3 results) No results for input(s): PROBNP in the last 8760 hours. HbA1C: No results for input(s): HGBA1C in the last 72 hours. CBG: No results for input(s): GLUCAP in the last 168 hours. Lipid Profile: No results for input(s): CHOL, HDL, LDLCALC, TRIG, CHOLHDL, LDLDIRECT in the last 72 hours. Thyroid Function Tests: No results for input(s): TSH, T4TOTAL, FREET4, T3FREE, THYROIDAB in the last 72 hours. Anemia Panel: No results for input(s): VITAMINB12, FOLATE, FERRITIN, TIBC, IRON, RETICCTPCT in the last 72 hours. Sepsis Labs: No results for input(s): PROCALCITON, LATICACIDVEN in the last 168 hours.  Recent Results (from the past 240 hour(s))  Respiratory Panel by RT PCR (Flu A&B, Covid)  - Nasopharyngeal Swab     Status: None   Collection Time: 07/04/19 12:09 PM   Specimen: Nasopharyngeal Swab  Result Value Ref Range Status   SARS Coronavirus 2 by RT PCR NEGATIVE NEGATIVE Final    Comment: (NOTE) SARS-CoV-2 target nucleic acids are NOT DETECTED. The SARS-CoV-2 RNA is generally detectable in upper respiratoy specimens during the acute phase of infection. The lowest concentration of SARS-CoV-2 viral copies this assay can detect is 131 copies/mL. A negative result does not preclude SARS-Cov-2 infection and should not be used as the sole basis for treatment or other patient management decisions. A negative result may occur with  improper specimen collection/handling, submission of specimen other than nasopharyngeal swab, presence of viral mutation(s) within the areas targeted by this assay, and inadequate number of viral copies (<131 copies/mL). A negative result must be combined with clinical observations, patient history, and epidemiological information. The expected result is Negative. Fact Sheet for Patients:  PinkCheek.be Fact Sheet for Healthcare Providers:  GravelBags.it This test is not yet ap proved or cleared by the [place] FDA and  has been authorized for detection and/or diagnosis of SARS-CoV-2 by FDA under an Emergency Use Authorization (EUA). This EUA will remain  in effect (meaning this test can  be used) for the duration of the COVID-19 declaration under Section 564(b)(1) of the Act, 21 U.S.C. section 360bbb-3(b)(1), unless the authorization is terminated or revoked sooner.    Influenza A by PCR NEGATIVE NEGATIVE Final   Influenza B by PCR NEGATIVE NEGATIVE Final    Comment: (NOTE) The Xpert Xpress SARS-CoV-2/FLU/RSV assay is intended as an aid in  the diagnosis of influenza from Nasopharyngeal swab specimens and  should not be used as a sole basis for treatment. Nasal washings and   aspirates are unacceptable for Xpert Xpress SARS-CoV-2/FLU/RSV  testing. Fact Sheet for Patients: PinkCheek.be Fact Sheet for Healthcare Providers: GravelBags.it This test is not yet approved or cleared by the [place] FDA and  has been authorized for detection and/or diagnosis of SARS-CoV-2 by  FDA under an Emergency Use Authorization (EUA). This EUA will remain  in effect (meaning this test can be used) for the duration of the  Covid-19 declaration under Section 564(b)(1) of the Act, 21  U.S.C. section 360bbb-3(b)(1), unless the authorization is  terminated or revoked. Performed at [facility], [address] [place], [place] [address]   MRSA PCR Screening     Status: None   Collection Time: 07/05/19  2:46 AM   Specimen: Nasal Mucosa; Nasopharyngeal  Result Value Ref Range Status   MRSA by PCR NEGATIVE NEGATIVE Final    Comment:        The GeneXpert MRSA Assay (FDA approved for NASAL specimens only), is one component of a comprehensive MRSA colonization surveillance program. It is not intended to diagnose MRSA infection nor to guide or monitor treatment for MRSA infections. Performed at [facility], [address], [place], [place] [address]          Radiology Studies: CT Angio Chest PE W and/or Wo Contrast  Result Date: 07/04/2019 CLINICAL DATA:  Shortness of breath.  Chest pain.  Renal failure EXAM: CT ANGIOGRAPHY CHEST WITH CONTRAST TECHNIQUE: Multidetector CT imaging of the chest was performed using the standard protocol during bolus administration of intravenous contrast. Multiplanar CT image reconstructions and MIPs were obtained to evaluate the vascular anatomy. CONTRAST:  131mL OMNIPAQUE IOHEXOL 350 MG/ML SOLN COMPARISON:  Chest radiograph July 04, 2019 chest CT angiogram August 27, 2018 FINDINGS: Cardiovascular: There is no demonstrable pulmonary embolus. The ascending  thoracic aortic diameter measures 4.3 x 4.3 cm. There is no evident thoracic aortic dissection. Visualized great vessels show scattered foci of arterial vascular calcification. There is aortic atherosclerosis. There are foci of coronary artery calcification. Heart is mildly enlarged in a generalized manner. There is no pericardial effusion or pericardial thickening. The main pulmonary outflow tract is enlarged, measuring 3.9 cm in diameter. Mediastinum/Nodes: Visualized thyroid appears unremarkable. There are scattered subcentimeter mediastinal lymph nodes. There is lymph node enlargement in the subcarinal region with the largest lymph node in this area measuring 1.8 x 1.6 cm. There is a pretracheal lymph node measuring 1.6 x 1.4 cm. There is an aortopulmonary window lymph node measuring 1.5 x 1.4 cm. No esophageal lesions are evident. Lungs/Pleura: There is atelectatic change in the right base and in the right upper lobe near the apex. There is a stable small bulla in the lateral segment left lower lobe. There is no edema or consolidation. No pleural effusions are evident. Upper Abdomen: There is aortic atherosclerosis in the upper abdominal aorta. Adrenals appear somewhat hypertrophied bilaterally. Visualized upper abdominal structures otherwise appear unremarkable. Musculoskeletal: No blastic or lytic bone lesions evident. No chest wall lesions  appreciable. Review of the MIP images confirms the above findings. IMPRESSION: 1.  No appreciable pulmonary embolus. 2. Ascending thoracic aortic prominence with ascending thoracic aorta measuring 4.3 x 4.3 cm in transverse diameter. No dissection evident. There are foci of aortic atherosclerosis as well as foci of great vessel and coronary artery calcification. Recommend annual imaging followup by CTA or MRA. This recommendation follows 2010 ACCF/AHA/AATS/ACR/ASA/SCA/SCAI/SIR/STS/SVM Guidelines for the Diagnosis and Management of Patients with Thoracic Aortic Disease.  Circulation. 2010; [id number]. Aortic aneurysm NOS (ICD10-I71.9). 3. Enlargement of the main pulmonary outflow tract, a finding indicative of pulmonary arterial hypertension. 4.  Areas of atelectatic change.  No edema or consolidation. 5.  Several enlarged lymph nodes of uncertain etiology. 6.  Bilateral adrenal hypertrophy of uncertain etiology. Aortic Atherosclerosis (ICD10-I70.0). Electronically Signed   By: Lowella Grip III M.D.   On: 07/04/2019 11:30   DG Chest Port 1 View  Result Date: 07/05/2019 CLINICAL DATA:  Chest pain EXAM: PORTABLE CHEST 1 VIEW COMPARISON:  07/04/2019 FINDINGS: 1142 hours. Low volume film. The cardio pericardial silhouette is enlarged. There is pulmonary vascular congestion without overt pulmonary edema. Probable atelectasis left base. No overt pulmonary edema or focal airspace consolidation. The visualized bony structures of the thorax are intact. IMPRESSION: 1. Low volume film. 2. Cardiomegaly with vascular congestion. No overt edema or focal lung consolidation. Electronically Signed   By: Misty Stanley M.D.   On: 07/05/2019 11:55        Scheduled Meds: . amLODipine  10 mg Oral Daily  . apixaban  2.5 mg Oral BID  . atorvastatin  40 mg Oral QHS  . ferric citrate  210 mg Oral TID WC  . hydrALAZINE  100 mg Oral TID  . irbesartan  75 mg Oral QHS  . isosorbide mononitrate  60 mg Oral Daily  . oxyCODONE  20 mg Oral Q12H  . sevelamer carbonate  800 mg Oral TID WC  . sodium chloride flush  3 mL Intravenous Q12H  . tamsulosin  0.4 mg Oral Daily   Continuous Infusions:   LOS: 2 days   Time spent= 30 mins    Nicholas Wrightsman Arsenio Loader, MD [facility]  If 7PM-7AM, please contact night-coverage  07/06/2019, 11:03 AM

## 2019-07-06 NOTE — Progress Notes (Signed)
This note also relates to the following rows which could not be included: Pulse Rate - Cannot attach notes to unvalidated device data Resp - Cannot attach notes to unvalidated device data BP - Cannot attach notes to unvalidated device data  Hd completed  

## 2019-07-06 NOTE — Progress Notes (Signed)
This note also relates to the following rows which could not be included: Pulse Rate - Cannot attach notes to unvalidated device data Resp - Cannot attach notes to unvalidated device data BP - Cannot attach notes to unvalidated device data  Hd started  

## 2019-07-06 NOTE — Progress Notes (Signed)
[facility]  ROUNDING NOTE   Subjective:   Seen and examined on hemodialysis treatment. Tolerating treatment well. UF of 3 liters.     HEMODIALYSIS FLOWSHEET:  Blood Flow Rate (mL/min): 400 mL/min Arterial Pressure (mmHg): -180 mmHg Venous Pressure (mmHg): 180 mmHg Transmembrane Pressure (mmHg): 60 mmHg Ultrafiltration Rate (mL/min): 1170 mL/min Dialysate Flow Rate (mL/min): 600 ml/min Conductivity: Machine : 14.3 Conductivity: Machine : 14.3 Dialysis Fluid Bolus: Normal Saline Bolus Amount (mL): 250 mL    Objective:  Vital signs in last 24 hours:  Temp:  [97.9 F (36.6 C)-98 F (36.7 C)] 97.9 F (36.6 C) (12/26 0502) Pulse Rate:  [59-84] 78 (12/26 1130) Resp:  [12-18] 13 (12/26 1130) BP: (130-159)/(75-100) 143/86 (12/26 1130) SpO2:  [94 %-99 %] 94 % (12/26 0502) Weight:  [110 kg] 110 kg (12/26 0847)  Weight change:  Filed Weights   07/04/19 0834 07/06/19 0847  Weight: 108.9 kg 110 kg    Intake/Output: I/O last 3 completed shifts: In: 67 [P.O.:956] Out: 850 [Urine:850]   Intake/Output this shift:  Total I/O In: 260 [P.O.:260] Out: 225 [Urine:225]  Physical Exam: General: NAD,   Head: Normocephalic, atraumatic. Moist oral mucosal membranes  Eyes: Anicteric, PERRL  Neck: Supple, trachea midline  Chest Tenderness with palpation  Lungs:  clear  Heart: Regular rate and rhythm  Abdomen:  Soft, nontender,   Extremities:  + peripheral edema.  Neurologic: Nonfocal, moving all four extremities  Skin: No lesions  Access: Left AVF    Basic Metabolic Panel: Recent Labs  Lab 07/04/19 0846 07/05/19 0443 07/06/19 0811  NA 141 138 139  K 5.0 4.3 4.2  CL 104 97* 99  CO2 23 28 28   GLUCOSE 93 98 95  BUN 73* 43* 50*  CREATININE 11.35* 8.23* 9.67*  CALCIUM 8.3* 8.3* 8.6*  MG  --   --  2.3    Liver Function Tests: Recent Labs  Lab 07/04/19 0846 07/06/19 0811  AST 20 13*  ALT 25 17  ALKPHOS 45 44  BILITOT 1.0 0.9  PROT 6.5 6.4*   ALBUMIN 3.5 3.4*   No results for input(s): LIPASE, AMYLASE in the last 168 hours. No results for input(s): AMMONIA in the last 168 hours.  CBC: Recent Labs  Lab 07/04/19 0846 07/06/19 0613  WBC 5.5 4.7  NEUTROABS 3.5  --   HGB 9.7* 9.7*  HCT 28.0* 28.9*  MCV 85.1 87.6  PLT 102* 98*    Cardiac Enzymes: No results for input(s): CKTOTAL, CKMB, CKMBINDEX, TROPONINI in the last 168 hours.  BNP: Invalid input(s): POCBNP  CBG: No results for input(s): GLUCAP in the last 168 hours.  Microbiology: Results for orders placed or performed during the hospital encounter of 07/04/19  Respiratory Panel by RT PCR (Flu A&B, Covid) - Nasopharyngeal Swab     Status: None   Collection Time: 07/04/19 12:09 PM   Specimen: Nasopharyngeal Swab  Result Value Ref Range Status   SARS Coronavirus 2 by RT PCR NEGATIVE NEGATIVE Final    Comment: (NOTE) SARS-CoV-2 target nucleic acids are NOT DETECTED. The SARS-CoV-2 RNA is generally detectable in upper respiratoy specimens during the acute phase of infection. The lowest concentration of SARS-CoV-2 viral copies this assay can detect is 131 copies/mL. A negative result does not preclude SARS-Cov-2 infection and should not be used as the sole basis for treatment or other patient management decisions. A negative result may occur with  improper specimen collection/handling, submission of specimen other than nasopharyngeal swab, presence of viral  mutation(s) within the areas targeted by this assay, and inadequate number of viral copies (<131 copies/mL). A negative result must be combined with clinical observations, patient history, and epidemiological information. The expected result is Negative. Fact Sheet for Patients:  PinkCheek.be Fact Sheet for Healthcare Providers:  GravelBags.it This test is not yet ap proved or cleared by the [place] FDA and  has been authorized for detection  and/or diagnosis of SARS-CoV-2 by FDA under an Emergency Use Authorization (EUA). This EUA will remain  in effect (meaning this test can be used) for the duration of the COVID-19 declaration under Section 564(b)(1) of the Act, 21 U.S.C. section 360bbb-3(b)(1), unless the authorization is terminated or revoked sooner.    Influenza A by PCR NEGATIVE NEGATIVE Final   Influenza B by PCR NEGATIVE NEGATIVE Final    Comment: (NOTE) The Xpert Xpress SARS-CoV-2/FLU/RSV assay is intended as an aid in  the diagnosis of influenza from Nasopharyngeal swab specimens and  should not be used as a sole basis for treatment. Nasal washings and  aspirates are unacceptable for Xpert Xpress SARS-CoV-2/FLU/RSV  testing. Fact Sheet for Patients: PinkCheek.be Fact Sheet for Healthcare Providers: GravelBags.it This test is not yet approved or cleared by the [place] FDA and  has been authorized for detection and/or diagnosis of SARS-CoV-2 by  FDA under an Emergency Use Authorization (EUA). This EUA will remain  in effect (meaning this test can be used) for the duration of the  Covid-19 declaration under Section 564(b)(1) of the Act, 21  U.S.C. section 360bbb-3(b)(1), unless the authorization is  terminated or revoked. Performed at [facility], [address] [place], [place] [address]   MRSA PCR Screening     Status: None   Collection Time: 07/05/19  2:46 AM   Specimen: Nasal Mucosa; Nasopharyngeal  Result Value Ref Range Status   MRSA by PCR NEGATIVE NEGATIVE Final    Comment:        The GeneXpert MRSA Assay (FDA approved for NASAL specimens only), is one component of a comprehensive MRSA colonization surveillance program. It is not intended to diagnose MRSA infection nor to guide or monitor treatment for MRSA infections. Performed at [facility], [address] [place], [place] [address]     Coagulation  Studies: No results for input(s): LABPROT, INR in the last 72 hours.  Urinalysis: No results for input(s): COLORURINE, LABSPEC, PHURINE, GLUCOSEU, HGBUR, BILIRUBINUR, KETONESUR, PROTEINUR, UROBILINOGEN, NITRITE, LEUKOCYTESUR in the last 72 hours.  Invalid input(s): APPERANCEUR    Imaging: DG Chest Port 1 View  Result Date: 07/05/2019 CLINICAL DATA:  Chest pain EXAM: PORTABLE CHEST 1 VIEW COMPARISON:  07/04/2019 FINDINGS: 1142 hours. Low volume film. The cardio pericardial silhouette is enlarged. There is pulmonary vascular congestion without overt pulmonary edema. Probable atelectasis left base. No overt pulmonary edema or focal airspace consolidation. The visualized bony structures of the thorax are intact. IMPRESSION: 1. Low volume film. 2. Cardiomegaly with vascular congestion. No overt edema or focal lung consolidation. Electronically Signed   By: Misty Stanley M.D.   On: 07/05/2019 11:55     Medications:    . amLODipine  10 mg Oral Daily  . apixaban  2.5 mg Oral BID  . atorvastatin  40 mg Oral QHS  . ferric citrate  210 mg Oral TID WC  . hydrALAZINE  100 mg Oral TID  . irbesartan  75 mg Oral QHS  . isosorbide mononitrate  60 mg Oral Daily  . oxyCODONE  20 mg Oral  Q12H  . sevelamer carbonate  800 mg Oral TID WC  . sodium chloride flush  3 mL Intravenous Q12H  . tamsulosin  0.4 mg Oral Daily   albuterol, ondansetron **OR** ondansetron (ZOFRAN) IV, oxyCODONE, polyethylene glycol  Assessment/ Plan:  [name] is a 63 y.o. black male with end stage renal disease on hemodialysis, hypertension, depression, coronary disease, hypertension, pulmonary embolism, hepatitis C, history of substance abuse presents to [facility] on 07/04/2019 for uremic symptoms.   No outpatient dialysis unit.   1. End Stage Renal Disease: seen and examined on hemodialysis treatment. Tolerating treatment well.   2. Hypertension: urgency on admission. Reports no more chest pain.  Blood pressure  well controlled with resuming home blood pressure regimen. Irbesartan, tamsulosin, isosorbide mononitrate, hydralazine and amlodipine.  Concern for cocaine induced hypertension and cocaine induced hypertension  3. Anemia of chronic kidney disease: hemoglobin 9.7.  - held EPO due to hypertension.   4. Secondary Hyperparathyroidism:  - sevelamer   LOS: 2 Lakiesha Ralphs 12/26/202011:38 AM

## 2019-07-07 DIAGNOSIS — J9621 Acute and chronic respiratory failure with hypoxia: Secondary | ICD-10-CM

## 2019-07-07 DIAGNOSIS — D696 Thrombocytopenia, unspecified: Secondary | ICD-10-CM

## 2019-07-07 DIAGNOSIS — J9601 Acute respiratory failure with hypoxia: Secondary | ICD-10-CM

## 2019-07-07 DIAGNOSIS — D638 Anemia in other chronic diseases classified elsewhere: Secondary | ICD-10-CM

## 2019-07-07 DIAGNOSIS — I10 Essential (primary) hypertension: Secondary | ICD-10-CM

## 2019-07-07 DIAGNOSIS — E8770 Fluid overload, unspecified: Secondary | ICD-10-CM

## 2019-07-07 DIAGNOSIS — I5031 Acute diastolic (congestive) heart failure: Secondary | ICD-10-CM

## 2019-07-07 LAB — COMPREHENSIVE METABOLIC PANEL
ALT: 16 U/L (ref 0–44)
AST: 13 U/L — ABNORMAL LOW (ref 15–41)
Albumin: 3.5 g/dL (ref 3.5–5.0)
Alkaline Phosphatase: 44 U/L (ref 38–126)
Anion gap: 11 (ref 5–15)
BUN: 32 mg/dL — ABNORMAL HIGH (ref 8–23)
CO2: 28 mmol/L (ref 22–32)
Calcium: 8.9 mg/dL (ref 8.9–10.3)
Chloride: 97 mmol/L — ABNORMAL LOW (ref 98–111)
Creatinine, Ser: 7.48 mg/dL — ABNORMAL HIGH (ref 0.61–1.24)
GFR calc Af Amer: 8 mL/min — ABNORMAL LOW (ref >60)
GFR calc non Af Amer: 7 mL/min — ABNORMAL LOW (ref >60)
Glucose, Bld: 94 mg/dL (ref 70–99)
Potassium: 4 mmol/L (ref 3.5–5.1)
Sodium: 136 mmol/L (ref 135–145)
Total Bilirubin: 0.8 mg/dL (ref 0.3–1.2)
Total Protein: 6.5 g/dL (ref 6.5–8.1)

## 2019-07-07 LAB — MAGNESIUM: Magnesium: 2 mg/dL (ref 1.7–2.4)

## 2019-07-07 LAB — CBC
HCT: 30.6 % — ABNORMAL LOW (ref 39.0–52.0)
Hemoglobin: 10.6 g/dL — ABNORMAL LOW (ref 13.0–17.0)
MCH: 29.4 pg (ref 26.0–34.0)
MCHC: 34.6 g/dL (ref 30.0–36.0)
MCV: 85 fL (ref 80.0–100.0)
Platelets: 107 10*3/uL — ABNORMAL LOW (ref 150–400)
RBC: 3.6 MIL/uL — ABNORMAL LOW (ref 4.22–5.81)
RDW: 14.7 % (ref 11.5–15.5)
WBC: 5.4 10*3/uL (ref 4.0–10.5)
nRBC: 0 % (ref 0.0–0.2)

## 2019-07-07 MED ORDER — EPOETIN ALFA 10000 UNIT/ML IJ SOLN
4000.0000 [IU] | INTRAMUSCULAR | Status: DC
  Administered 2019-07-08: 4000 [IU] via INTRAVENOUS

## 2019-07-07 NOTE — Progress Notes (Signed)
[facility]  ROUNDING NOTE   Subjective:   Hemodialysis treatment yesterday. Tolerated treatment well. UF of 3 liters.   Objective:  Vital signs in last 24 hours:  Temp:  [97.9 F (36.6 C)-98.5 F (36.9 C)] 97.9 F (36.6 C) (12/27 1126) Pulse Rate:  [61-95] 86 (12/27 1126) Resp:  [16-20] 17 (12/27 0500) BP: (132-175)/(74-98) 138/74 (12/27 1126) SpO2:  [97 %-100 %] 99 % (12/27 1126)  Weight change:  Filed Weights   07/04/19 0834 07/06/19 0847  Weight: 108.9 kg 110 kg    Intake/Output: I/O last 3 completed shifts: In: 1223 [P.O.:1220; I.V.:3] Out: [id number] [Urine:1175; Other:3000]   Intake/Output this shift:  Total I/O In: 240 [P.O.:240] Out: 175 [Urine:175]  Physical Exam: General: NAD,   Head: Normocephalic, atraumatic. Moist oral mucosal membranes  Eyes: Anicteric, PERRL  Neck: Supple, trachea midline  Chest Tenderness with palpation  Lungs:  clear  Heart: Regular rate and rhythm  Abdomen:  Soft, nontender,   Extremities:  + peripheral edema.  Neurologic: Nonfocal, moving all four extremities  Skin: No lesions  Access: Left AVF    Basic Metabolic Panel: Recent Labs  Lab 07/04/19 0846 07/05/19 0443 07/06/19 0811 07/07/19 0655  NA 141 138 139 136  K 5.0 4.3 4.2 4.0  CL 104 97* 99 97*  CO2 23 28 28 28   GLUCOSE 93 98 95 94  BUN 73* 43* 50* 32*  CREATININE 11.35* 8.23* 9.67* 7.48*  CALCIUM 8.3* 8.3* 8.6* 8.9  MG  --   --  2.3 2.0    Liver Function Tests: Recent Labs  Lab 07/04/19 0846 07/06/19 0811 07/07/19 0655  AST 20 13* 13*  ALT 25 17 16   ALKPHOS 45 44 44  BILITOT 1.0 0.9 0.8  PROT 6.5 6.4* 6.5  ALBUMIN 3.5 3.4* 3.5   No results for input(s): LIPASE, AMYLASE in the last 168 hours. No results for input(s): AMMONIA in the last 168 hours.  CBC: Recent Labs  Lab 07/04/19 0846 07/06/19 0613 07/07/19 0655  WBC 5.5 4.7 5.4  NEUTROABS 3.5  --   --   HGB 9.7* 9.7* 10.6*  HCT 28.0* 28.9* 30.6*  MCV 85.1 87.6 85.0  PLT 102*  98* 107*    Cardiac Enzymes: No results for input(s): CKTOTAL, CKMB, CKMBINDEX, TROPONINI in the last 168 hours.  BNP: Invalid input(s): POCBNP  CBG: No results for input(s): GLUCAP in the last 168 hours.  Microbiology: Results for orders placed or performed during the hospital encounter of 07/04/19  Respiratory Panel by RT PCR (Flu A&B, Covid) - Nasopharyngeal Swab     Status: None   Collection Time: 07/04/19 12:09 PM   Specimen: Nasopharyngeal Swab  Result Value Ref Range Status   SARS Coronavirus 2 by RT PCR NEGATIVE NEGATIVE Final    Comment: (NOTE) SARS-CoV-2 target nucleic acids are NOT DETECTED. The SARS-CoV-2 RNA is generally detectable in upper respiratoy specimens during the acute phase of infection. The lowest concentration of SARS-CoV-2 viral copies this assay can detect is 131 copies/mL. A negative result does not preclude SARS-Cov-2 infection and should not be used as the sole basis for treatment or other patient management decisions. A negative result may occur with  improper specimen collection/handling, submission of specimen other than nasopharyngeal swab, presence of viral mutation(s) within the areas targeted by this assay, and inadequate number of viral copies (<131 copies/mL). A negative result must be combined with clinical observations, patient history, and epidemiological information. The expected result is Negative. Fact Sheet for  Patients:  PinkCheek.be Fact Sheet for Healthcare Providers:  GravelBags.it This test is not yet ap proved or cleared by the [place] and  has been authorized for detection and/or diagnosis of SARS-CoV-2 by FDA under an Emergency Use Authorization (EUA). This EUA will remain  in effect (meaning this test can be used) for the duration of the COVID-19 declaration under Section 564(b)(1) of the Act, 21 U.S.C. section 360bbb-3(b)(1), unless the authorization is  terminated or revoked sooner.    Influenza A by PCR NEGATIVE NEGATIVE Final   Influenza B by PCR NEGATIVE NEGATIVE Final    Comment: (NOTE) The Xpert Xpress SARS-CoV-2/FLU/RSV assay is intended as an aid in  the diagnosis of influenza from Nasopharyngeal swab specimens and  should not be used as a sole basis for treatment. Nasal washings and  aspirates are unacceptable for Xpert Xpress SARS-CoV-2/FLU/RSV  testing. Fact Sheet for Patients: PinkCheek.be Fact Sheet for Healthcare Providers: GravelBags.it This test is not yet approved or cleared by the [place] FDA and  has been authorized for detection and/or diagnosis of SARS-CoV-2 by  FDA under an Emergency Use Authorization (EUA). This EUA will remain  in effect (meaning this test can be used) for the duration of the  Covid-19 declaration under Section 564(b)(1) of the Act, 21  U.S.C. section 360bbb-3(b)(1), unless the authorization is  terminated or revoked. Performed at [facility], [address] [place], [place] [address]   MRSA PCR Screening     Status: None   Collection Time: 07/05/19  2:46 AM   Specimen: Nasal Mucosa; Nasopharyngeal  Result Value Ref Range Status   MRSA by PCR NEGATIVE NEGATIVE Final    Comment:        The GeneXpert MRSA Assay (FDA approved for NASAL specimens only), is one component of a comprehensive MRSA colonization surveillance program. It is not intended to diagnose MRSA infection nor to guide or monitor treatment for MRSA infections. Performed at [facility], [address] [place], [place] [address]     Coagulation Studies: No results for input(s): LABPROT, INR in the last 72 hours.  Urinalysis: No results for input(s): COLORURINE, LABSPEC, PHURINE, GLUCOSEU, HGBUR, BILIRUBINUR, KETONESUR, PROTEINUR, UROBILINOGEN, NITRITE, LEUKOCYTESUR in the last 72 hours.  Invalid input(s): APPERANCEUR     Imaging: No results found.   Medications:    . amLODipine  10 mg Oral Daily  . apixaban  2.5 mg Oral BID  . atorvastatin  40 mg Oral QHS  . ferric citrate  210 mg Oral TID WC  . hydrALAZINE  100 mg Oral TID  . irbesartan  75 mg Oral QHS  . isosorbide mononitrate  60 mg Oral Daily  . oxyCODONE  20 mg Oral Q12H  . sevelamer carbonate  800 mg Oral TID WC  . sodium chloride flush  3 mL Intravenous Q12H  . tamsulosin  0.4 mg Oral Daily   albuterol, ondansetron **OR** ondansetron (ZOFRAN) IV, oxyCODONE, polyethylene glycol  Assessment/ Plan:  [name] is a 64 y.o. black male with end stage renal disease on hemodialysis, hypertension, depression, coronary disease, hypertension, pulmonary embolism, hepatitis C, history of substance abuse presents to [facility] on 07/04/2019 for uremic symptoms.   No outpatient dialysis unit.   1. End Stage Renal Disease: Next treatment for tomorrow Outpatient planning to be initiated tomorrow.   2. Hypertension: urgency on admission. Reports no more chest pain.  Blood pressure well controlled with resuming home blood pressure regimen. Irbesartan, tamsulosin, isosorbide mononitrate,  hydralazine and amlodipine.  Concern for cocaine induced hypertension and cocaine induced hypertension on admission.   3. Anemia of chronic kidney disease:  - EPO with HD treatment  4. Secondary Hyperparathyroidism:  - sevelamer   LOS: 3 Maricsa Sammons 12/27/202012:58 PM

## 2019-07-07 NOTE — Progress Notes (Signed)
Patient ID: RIGGEN RABA, male   DOB: Nov 04, 1955, 63 y.o.   MRN: [id number] [facility] PROGRESS NOTE  JARRELL SFERRAZZA [id number] DOB: Aug 30, 1955 DOA: 07/04/2019 PCP: Patient, No Pcp Per  HPI/Subjective: Patient had nausea this morning and vomited.  Otherwise he is feeling okay.  No abdominal pain.  No diarrhea.  Objective: Vitals:   07/07/19 0500 07/07/19 1126  BP: 139/81 138/74  Pulse: 76 86  Resp: 17   Temp: 98.2 F (36.8 C) 97.9 F (36.6 C)  SpO2: 100% 99%    Intake/Output Summary (Last 24 hours) at 07/07/2019 1357 Last data filed at 07/07/2019 1217 Gross per 24 hour  Intake 243 ml  Output 575 ml  Net -332 ml   Filed Weights   07/04/19 0834 07/06/19 0847  Weight: 108.9 kg 110 kg    ROS: Review of Systems  Constitutional: Negative for chills and fever.  Eyes: Negative for blurred vision.  Respiratory: Negative for cough and shortness of breath.   Cardiovascular: Negative for chest pain.  Gastrointestinal: Positive for nausea and vomiting. Negative for abdominal pain, constipation and diarrhea.  Genitourinary: Negative for dysuria.  Musculoskeletal: Negative for joint pain.  Neurological: Negative for dizziness and headaches.   Exam: Physical Exam  Constitutional: He is oriented to person, place, and time.  HENT:  Nose: No mucosal edema.  Mouth/Throat: No oropharyngeal exudate or posterior oropharyngeal edema.  Eyes: Pupils are equal, round, and reactive to light. Conjunctivae, EOM and lids are normal.  Neck: Carotid bruit is not present.  Cardiovascular: S1 normal and S2 normal. Exam reveals no gallop.  No murmur heard. Pulses:      Dorsalis pedis pulses are 2+ on the right side and 2+ on the left side.  Respiratory: No respiratory distress. He has no wheezes. He has no rhonchi. He has no rales.  GI: Soft. Bowel sounds are normal. There is no abdominal tenderness.  Musculoskeletal:     Right ankle: No swelling.     Left ankle: No swelling.   Lymphadenopathy:    He has no cervical adenopathy.  Neurological: He is alert and oriented to person, place, and time. No cranial nerve deficit.  Skin: Skin is warm. No rash noted. Nails show no clubbing.  Psychiatric: He has a normal mood and affect.      Data Reviewed: Basic Metabolic Panel: Recent Labs  Lab 07/04/19 0846 07/05/19 0443 07/06/19 0811 07/07/19 0655  NA 141 138 139 136  K 5.0 4.3 4.2 4.0  CL 104 97* 99 97*  CO2 23 28 28 28   GLUCOSE 93 98 95 94  BUN 73* 43* 50* 32*  CREATININE 11.35* 8.23* 9.67* 7.48*  CALCIUM 8.3* 8.3* 8.6* 8.9  MG  --   --  2.3 2.0   Liver Function Tests: Recent Labs  Lab 07/04/19 0846 07/06/19 0811 07/07/19 0655  AST 20 13* 13*  ALT 25 17 16   ALKPHOS 45 44 44  BILITOT 1.0 0.9 0.8  PROT 6.5 6.4* 6.5  ALBUMIN 3.5 3.4* 3.5   CBC: Recent Labs  Lab 07/04/19 0846 07/06/19 0613 07/07/19 0655  WBC 5.5 4.7 5.4  NEUTROABS 3.5  --   --   HGB 9.7* 9.7* 10.6*  HCT 28.0* 28.9* 30.6*  MCV 85.1 87.6 85.0  PLT 102* 98* 107*   BNP (last 3 results) Recent Labs    08/25/18 0526 11/14/18 0002 07/04/19 0846  BNP >4,500.0* 4,024.0* 4,024.0*     Recent Results (from the past 240 hour(s))  Respiratory  Panel by RT PCR (Flu A&B, Covid) - Nasopharyngeal Swab     Status: None   Collection Time: 07/04/19 12:09 PM   Specimen: Nasopharyngeal Swab  Result Value Ref Range Status   SARS Coronavirus 2 by RT PCR NEGATIVE NEGATIVE Final    Comment: (NOTE) SARS-CoV-2 target nucleic acids are NOT DETECTED. The SARS-CoV-2 RNA is generally detectable in upper respiratoy specimens during the acute phase of infection. The lowest concentration of SARS-CoV-2 viral copies this assay can detect is 131 copies/mL. A negative result does not preclude SARS-Cov-2 infection and should not be used as the sole basis for treatment or other patient management decisions. A negative result may occur with  improper specimen collection/handling, submission of  specimen other than nasopharyngeal swab, presence of viral mutation(s) within the areas targeted by this assay, and inadequate number of viral copies (<131 copies/mL). A negative result must be combined with clinical observations, patient history, and epidemiological information. The expected result is Negative. Fact Sheet for Patients:  PinkCheek.be Fact Sheet for Healthcare Providers:  GravelBags.it This test is not yet ap proved or cleared by the [place] FDA and  has been authorized for detection and/or diagnosis of SARS-CoV-2 by FDA under an Emergency Use Authorization (EUA). This EUA will remain  in effect (meaning this test can be used) for the duration of the COVID-19 declaration under Section 564(b)(1) of the Act, 21 U.S.C. section 360bbb-3(b)(1), unless the authorization is terminated or revoked sooner.    Influenza A by PCR NEGATIVE NEGATIVE Final   Influenza B by PCR NEGATIVE NEGATIVE Final    Comment: (NOTE) The Xpert Xpress SARS-CoV-2/FLU/RSV assay is intended as an aid in  the diagnosis of influenza from Nasopharyngeal swab specimens and  should not be used as a sole basis for treatment. Nasal washings and  aspirates are unacceptable for Xpert Xpress SARS-CoV-2/FLU/RSV  testing. Fact Sheet for Patients: PinkCheek.be Fact Sheet for Healthcare Providers: GravelBags.it This test is not yet approved or cleared by the [place] FDA and  has been authorized for detection and/or diagnosis of SARS-CoV-2 by  FDA under an Emergency Use Authorization (EUA). This EUA will remain  in effect (meaning this test can be used) for the duration of the  Covid-19 declaration under Section 564(b)(1) of the Act, 21  U.S.C. section 360bbb-3(b)(1), unless the authorization is  terminated or revoked. Performed at [facility], [address]  [place], [place] [address]   MRSA PCR Screening     Status: None   Collection Time: 07/05/19  2:46 AM   Specimen: Nasal Mucosa; Nasopharyngeal  Result Value Ref Range Status   MRSA by PCR NEGATIVE NEGATIVE Final    Comment:        The GeneXpert MRSA Assay (FDA approved for NASAL specimens only), is one component of a comprehensive MRSA colonization surveillance program. It is not intended to diagnose MRSA infection nor to guide or monitor treatment for MRSA infections. Performed at [facility], [address] [place], [place] [address]       Scheduled Meds: . amLODipine  10 mg Oral Daily  . apixaban  2.5 mg Oral BID  . atorvastatin  40 mg Oral QHS  . [START ON 07/08/2019] epoetin (EPOGEN/PROCRIT) injection  4,000 Units Intravenous Q M,W,F-HD  . ferric citrate  210 mg Oral TID WC  . hydrALAZINE  100 mg Oral TID  . irbesartan  75 mg Oral QHS  . isosorbide mononitrate  60 mg Oral Daily  . oxyCODONE  20 mg Oral Q12H  . sevelamer carbonate  800 mg Oral TID WC  . sodium chloride flush  3 mL Intravenous Q12H  . tamsulosin  0.4 mg Oral Daily   Continuous Infusions:  Assessment/Plan:  1. Acute hypoxic respiratory failure.  This has resolved. 2. Acute fluid overload and acute on chronic diastolic heart failure.  This has improved with restarting dialysis. 3. End-stage renal disease.  Case discussed with nephrology and the patient does not have a confirmed dialysis slot at this point.  Will have to make sure he has an outpatient slot prior to disposition. 4. Essential hypertension continue amlodipine, irbesartan and hydralazine 5. History of pulmonary embolism.  Patient off anticoagulation.  CT scan of the chest negative for PE. 6. Anemia of chronic disease.  Last hemoglobin 10.6. 7. Chronic thrombocytopenia.  Looking back at old laboratory records from 2012, Patient has a history of hepatitis C. 8. Nausea vomiting likely secondary to uremia.  Code Status:     Code  Status Orders  (From admission, onward)         Start     Ordered   07/04/19 1115  Full code  Continuous     07/04/19 1117        Code Status History    Date Active Date Inactive Code Status Order ID Comments User Context   06/21/2019 2358 06/23/2019 1718 Full Code [id number]  Gwynne Edinger, MD ED   11/14/2018 0355 11/16/2018 2033 Full Code [id number]  Harrie Foreman, MD Inpatient   08/27/2018 1828 08/30/2018 1856 Full Code [id number]  Salary, Avel Peace, MD Inpatient   08/25/2018 0802 08/26/2018 1333 Full Code [id number]  Mayo, Pete Pelt, MD ED   08/20/2018 0400 08/21/2018 1823 Full Code [id number]  Harrie Foreman, MD Inpatient   08/15/2018 0058 08/18/2018 1624 Full Code [id number]  Lance Coon, MD Inpatient   09/21/2016 1519 09/27/2016 2042 Full Code [id number]  Hillary Bow, MD ED   04/28/2016 2325 04/29/2016 2043 Full Code [id number]  Muthersbaugh, Gwenlyn Perking ED   01/18/2016 0202 01/18/2016 1848 Full Code [id number]  Quintella Baton, MD Inpatient   Advance Care Planning Activity     Disposition Plan: Once outpatient dialysis slot obtained the patient will be discharged.  Consultants:  Nephrology  Time spent: 28 minutes  [place]

## 2019-07-08 DIAGNOSIS — J9601 Acute respiratory failure with hypoxia: Secondary | ICD-10-CM

## 2019-07-08 DIAGNOSIS — M545 Low back pain, unspecified: Secondary | ICD-10-CM

## 2019-07-08 DIAGNOSIS — G8929 Other chronic pain: Secondary | ICD-10-CM

## 2019-07-08 LAB — COMPREHENSIVE METABOLIC PANEL
ALT: 13 U/L (ref 0–44)
AST: 14 U/L — ABNORMAL LOW (ref 15–41)
Albumin: 3.6 g/dL (ref 3.5–5.0)
Alkaline Phosphatase: 45 U/L (ref 38–126)
Anion gap: 12 (ref 5–15)
BUN: 38 mg/dL — ABNORMAL HIGH (ref 8–23)
CO2: 25 mmol/L (ref 22–32)
Calcium: 8.9 mg/dL (ref 8.9–10.3)
Chloride: 98 mmol/L (ref 98–111)
Creatinine, Ser: 9.1 mg/dL — ABNORMAL HIGH (ref 0.61–1.24)
GFR calc Af Amer: 6 mL/min — ABNORMAL LOW (ref >60)
GFR calc non Af Amer: 6 mL/min — ABNORMAL LOW (ref >60)
Glucose, Bld: 109 mg/dL — ABNORMAL HIGH (ref 70–99)
Potassium: 3.8 mmol/L (ref 3.5–5.1)
Sodium: 135 mmol/L (ref 135–145)
Total Bilirubin: 0.9 mg/dL (ref 0.3–1.2)
Total Protein: 6.9 g/dL (ref 6.5–8.1)

## 2019-07-08 LAB — CBC
HCT: 33 % — ABNORMAL LOW (ref 39.0–52.0)
Hemoglobin: 10.8 g/dL — ABNORMAL LOW (ref 13.0–17.0)
MCH: 29.1 pg (ref 26.0–34.0)
MCHC: 32.7 g/dL (ref 30.0–36.0)
MCV: 88.9 fL (ref 80.0–100.0)
Platelets: 113 10*3/uL — ABNORMAL LOW (ref 150–400)
RBC: 3.71 MIL/uL — ABNORMAL LOW (ref 4.22–5.81)
RDW: 14.9 % (ref 11.5–15.5)
WBC: 6 10*3/uL (ref 4.0–10.5)
nRBC: 0 % (ref 0.0–0.2)

## 2019-07-08 LAB — MAGNESIUM: Magnesium: 2.2 mg/dL (ref 1.7–2.4)

## 2019-07-08 MED ORDER — POLYETHYLENE GLYCOL 3350 17 G PO PACK: 17.0000 g | PACK | Freq: Every day | ORAL | 0 refills | Status: DC | PRN

## 2019-07-08 MED ORDER — ONDANSETRON HCL 4 MG PO TABS: 4.0000 mg | ORAL_TABLET | Freq: Four times a day (QID) | ORAL | 0 refills | Status: DC | PRN

## 2019-07-08 NOTE — Progress Notes (Signed)
[name]  A and O x 4 VSS. Pt tolerating diet well. No complaints of pain or nausea. IV removed intact, prescriptions given. Pt voices understanding of discharge instructions with no further questions. Pt discharged via wheelchair with axillary.   Allergies as of 07/08/2019      Reactions   Cyclobenzaprine Nausea Only   Clonidine Other (See Comments)   Asymptomatic bradycardia to 38   Furosemide Other (See Comments)   Caused renal failure   Tylenol [acetaminophen] Other (See Comments)   Reaction:  Pt states it bothers his liver      Medication List    TAKE these medications   albuterol 108 (90 Base) MCG/ACT inhaler Commonly known as: VENTOLIN HFA Inhale 2 puffs into the lungs every 6 (six) hours as needed for wheezing or shortness of breath.   amLODipine 10 MG tablet Commonly known as: NORVASC Take 1 tablet (10 mg total) by mouth daily.   apixaban 2.5 MG Tabs tablet Commonly known as: Eliquis Take 1 tablet (2.5 mg total) by mouth 2 (two) times daily.   atorvastatin 40 MG tablet Commonly known as: LIPITOR Take 1 tablet (40 mg total) by mouth at bedtime.   Auryxia 1 GM 210 MG(Fe) tablet Generic drug: ferric citrate Take 210 mg by mouth See admin instructions. Take 1 tablet (210mg ) by mouth three times daily with meals and take 1 tablet (210mg ) by mouth with snacks   hydrALAZINE 100 MG tablet Commonly known as: APRESOLINE Take 1 tablet (100 mg total) by mouth 3 (three) times daily.   irbesartan 75 MG tablet Commonly known as: AVAPRO Take 1 tablet (75 mg total) by mouth at bedtime.   isosorbide mononitrate 60 MG 24 hr tablet Commonly known as: IMDUR Take 1 tablet (60 mg total) by mouth daily.   ondansetron 4 MG tablet Commonly known as: ZOFRAN Take 1 tablet (4 mg total) by mouth every 6 (six) hours as needed for nausea.   oxyCODONE 20 mg 12 hr tablet Commonly known as: OXYCONTIN Take 20 mg by mouth every 12 (twelve) hours.   oxyCODONE 5 MG immediate  release tablet Commonly known as: Oxy IR/ROXICODONE Take 5-10 mg by mouth every 4 (four) hours as needed for severe pain.   polyethylene glycol 17 g packet Commonly known as: MIRALAX / GLYCOLAX Take 17 g by mouth daily as needed for mild constipation.   sevelamer carbonate 800 MG tablet Commonly known as: RENVELA Take 1 tablet (800 mg total) by mouth 3 (three) times daily with meals.   tamsulosin 0.4 MG Caps capsule Commonly known as: FLOMAX Take 1 capsule (0.4 mg total) by mouth daily.       Vitals:   07/08/19 1615 07/08/19 1739  BP: 136/68 131/84  Pulse: 88 78  Resp: 16 16  Temp: 98 F (36.7 C) 98.6 F (37 C)  SpO2:  95%    [name]

## 2019-07-08 NOTE — Progress Notes (Signed)
This note also relates to the following rows which could not be included: Pulse Rate - Cannot attach notes to unvalidated device data Resp - Cannot attach notes to unvalidated device data BP - Cannot attach notes to unvalidated device data  Hd started  

## 2019-07-08 NOTE — Progress Notes (Signed)
[facility]  ROUNDING NOTE   Subjective:   Overall doing well Denies any acute shortness of breath No leg edema Able to eat without nausea or vomiting   Objective:  Vital signs in last 24 hours:  Temp:  [97.9 F (36.6 C)-98 F (36.7 C)] 98 F (36.7 C) (12/28 0329) Pulse Rate:  [75-86] 75 (12/28 0329) Resp:  [18] 18 (12/28 0329) BP: (119-138)/(71-81) 127/81 (12/28 0329) SpO2:  [96 %-99 %] 96 % (12/28 0329)  Weight change:  Filed Weights   07/04/19 0834 07/06/19 0847  Weight: 108.9 kg 110 kg    Intake/Output: I/O last 3 completed shifts: In: 71 [P.O.:600; I.V.:3] Out: 975 [Urine:975]   Intake/Output this shift:  Total I/O In: 240 [P.O.:240] Out: 200 [Urine:200]  Physical Exam: General: NAD,   Head: Normocephalic, atraumatic. Moist oral mucosal membranes  Eyes: Anicteric  Lungs:   normal breathing effort on room air, clear to auscultation  Heart: Regular rate and rhythm, soft systolic murmur  Abdomen:  Soft, nontender,   Extremities:  + peripheral edema.  Neurologic:  Alert and oriented  Skin: No lesions  Access: Left AVF    Basic Metabolic Panel: Recent Labs  Lab 07/04/19 0846 07/05/19 0443 07/06/19 0811 07/07/19 0655 07/08/19 0406  NA 141 138 139 136 135  K 5.0 4.3 4.2 4.0 3.8  CL 104 97* 99 97* 98  CO2 23 28 28 28 25   GLUCOSE 93 98 95 94 109*  BUN 73* 43* 50* 32* 38*  CREATININE 11.35* 8.23* 9.67* 7.48* 9.10*  CALCIUM 8.3* 8.3* 8.6* 8.9 8.9  MG  --   --  2.3 2.0 2.2    Liver Function Tests: Recent Labs  Lab 07/04/19 0846 07/06/19 0811 07/07/19 0655 07/08/19 0406  AST 20 13* 13* 14*  ALT 25 17 16 13   ALKPHOS 45 44 44 45  BILITOT 1.0 0.9 0.8 0.9  PROT 6.5 6.4* 6.5 6.9  ALBUMIN 3.5 3.4* 3.5 3.6   No results for input(s): LIPASE, AMYLASE in the last 168 hours. No results for input(s): AMMONIA in the last 168 hours.  CBC: Recent Labs  Lab 07/04/19 0846 07/06/19 0613 07/07/19 0655 07/08/19 0406  WBC 5.5 4.7 5.4 6.0   NEUTROABS 3.5  --   --   --   HGB 9.7* 9.7* 10.6* 10.8*  HCT 28.0* 28.9* 30.6* 33.0*  MCV 85.1 87.6 85.0 88.9  PLT 102* 98* 107* 113*    Cardiac Enzymes: No results for input(s): CKTOTAL, CKMB, CKMBINDEX, TROPONINI in the last 168 hours.  BNP: Invalid input(s): POCBNP  CBG: No results for input(s): GLUCAP in the last 168 hours.  Microbiology: Results for orders placed or performed during the hospital encounter of 07/04/19  Respiratory Panel by RT PCR (Flu A&B, Covid) - Nasopharyngeal Swab     Status: None   Collection Time: 07/04/19 12:09 PM   Specimen: Nasopharyngeal Swab  Result Value Ref Range Status   SARS Coronavirus 2 by RT PCR NEGATIVE NEGATIVE Final    Comment: (NOTE) SARS-CoV-2 target nucleic acids are NOT DETECTED. The SARS-CoV-2 RNA is generally detectable in upper respiratoy specimens during the acute phase of infection. The lowest concentration of SARS-CoV-2 viral copies this assay can detect is 131 copies/mL. A negative result does not preclude SARS-Cov-2 infection and should not be used as the sole basis for treatment or other patient management decisions. A negative result may occur with  improper specimen collection/handling, submission of specimen other than nasopharyngeal swab, presence of viral mutation(s)  within the areas targeted by this assay, and inadequate number of viral copies (<131 copies/mL). A negative result must be combined with clinical observations, patient history, and epidemiological information. The expected result is Negative. Fact Sheet for Patients:  PinkCheek.be Fact Sheet for Healthcare Providers:  GravelBags.it This test is not yet ap proved or cleared by the [place] FDA and  has been authorized for detection and/or diagnosis of SARS-CoV-2 by FDA under an Emergency Use Authorization (EUA). This EUA will remain  in effect (meaning this test can be used) for the  duration of the COVID-19 declaration under Section 564(b)(1) of the Act, 21 U.S.C. section 360bbb-3(b)(1), unless the authorization is terminated or revoked sooner.    Influenza A by PCR NEGATIVE NEGATIVE Final   Influenza B by PCR NEGATIVE NEGATIVE Final    Comment: (NOTE) The Xpert Xpress SARS-CoV-2/FLU/RSV assay is intended as an aid in  the diagnosis of influenza from Nasopharyngeal swab specimens and  should not be used as a sole basis for treatment. Nasal washings and  aspirates are unacceptable for Xpert Xpress SARS-CoV-2/FLU/RSV  testing. Fact Sheet for Patients: PinkCheek.be Fact Sheet for Healthcare Providers: GravelBags.it This test is not yet approved or cleared by the [place] FDA and  has been authorized for detection and/or diagnosis of SARS-CoV-2 by  FDA under an Emergency Use Authorization (EUA). This EUA will remain  in effect (meaning this test can be used) for the duration of the  Covid-19 declaration under Section 564(b)(1) of the Act, 21  U.S.C. section 360bbb-3(b)(1), unless the authorization is  terminated or revoked. Performed at [facility], [address] [place], [place] [address]   MRSA PCR Screening     Status: None   Collection Time: 07/05/19  2:46 AM   Specimen: Nasal Mucosa; Nasopharyngeal  Result Value Ref Range Status   MRSA by PCR NEGATIVE NEGATIVE Final    Comment:        The GeneXpert MRSA Assay (FDA approved for NASAL specimens only), is one component of a comprehensive MRSA colonization surveillance program. It is not intended to diagnose MRSA infection nor to guide or monitor treatment for MRSA infections. Performed at [facility], [address] [place], [place] [address]     Coagulation Studies: No results for input(s): LABPROT, INR in the last 72 hours.  Urinalysis: No results for input(s): COLORURINE, LABSPEC, PHURINE, GLUCOSEU, HGBUR,  BILIRUBINUR, KETONESUR, PROTEINUR, UROBILINOGEN, NITRITE, LEUKOCYTESUR in the last 72 hours.  Invalid input(s): APPERANCEUR    Imaging: No results found.   Medications:    . amLODipine  10 mg Oral Daily  . apixaban  2.5 mg Oral BID  . atorvastatin  40 mg Oral QHS  . epoetin (EPOGEN/PROCRIT) injection  4,000 Units Intravenous Q M,W,F-HD  . ferric citrate  210 mg Oral TID WC  . hydrALAZINE  100 mg Oral TID  . irbesartan  75 mg Oral QHS  . isosorbide mononitrate  60 mg Oral Daily  . oxyCODONE  20 mg Oral Q12H  . sevelamer carbonate  800 mg Oral TID WC  . sodium chloride flush  3 mL Intravenous Q12H  . tamsulosin  0.4 mg Oral Daily   albuterol, ondansetron **OR** ondansetron (ZOFRAN) IV, oxyCODONE, polyethylene glycol  Assessment/ Plan:  [name] is a 63 y.o. black male with end stage renal disease on hemodialysis, hypertension, depression, coronary disease, hypertension, pulmonary embolism, hepatitis C, history of substance abuse presents to [facility] on 07/04/2019 for uremic symptoms.  No outpatient dialysis unit.   1. End Stage Renal Disease:  We will arrange for hemodialysis today Outpatient application for [facility] dialysis unit for MWF spot  2.  Anemia of chronic kidney disease:  - EPO with HD treatment  3. Secondary Hyperparathyroidism:  - sevelamer with meals  4.  History of pulmonary embolism Chronic anticoagulation with Eliquis  Hepatitis Latest Ref Rng & Units 06/21/2019  Hep B Surface Ag NON REACTIVE NON REACTIVE  Hep B IgM Negative -  Hep C Ab NEGATIVE -  Hep A IgM NEGATIVE -     LOS: 4 Laksh Hinners 12/28/202011:21 AM

## 2019-07-08 NOTE — Care Management Important Message (Signed)
Important Message  Patient Details  Name: PLATON BRACHER MRN: [id number] Date of Birth: [birth year]   Medicare Important Message Given:  Yes     Dannette Barbara 07/08/2019, 10:54 AM

## 2019-07-08 NOTE — Discharge Instructions (Signed)
Heart Failure, Diagnosis  Heart failure is a condition in which the heart has trouble pumping blood because it has become weak or stiff. This means that the heart does not pump blood well enough for the body to stay healthy. For some people with heart failure, fluid may back up into the lungs. There may also be swelling (edema) in the lower legs. Heart failure is usually a long-term (chronic) condition. It is important for you to take good care of yourself and follow the treatment plan from your health care provider. What are the causes? This condition may be caused by:  High blood pressure (hypertension). Hypertension causes the heart muscle to work harder than normal. This makes the heart stiff or weak.  Coronary artery disease, or CAD. CAD is the buildup of cholesterol and fat (plaque) in the arteries of the heart.  Heart attack, also called myocardial infarction. This injures the heart muscle, making it hard for the heart to pump blood.  Abnormal heart valves. The valves do not open and close properly, forcing the heart to pump harder to keep the blood flowing.  Heart muscle disease (cardiomyopathy or myocarditis). This is damage to the heart muscle. It can increase the risk of heart failure.  Lung disease. The heart works harder when the lungs are not healthy.  Abnormal heart rhythms. These can lead to heart failure. What increases the risk? The risk of heart failure increases as a person ages. This condition is also more likely to develop in people who:  Are overweight.  Are male.  Smoke or chew tobacco.  Abuse alcohol or illegal drugs.  Have taken medicines that can damage the heart, such as chemotherapy drugs.  Have diabetes.  Have abnormal heart rhythms.  Have thyroid problems.  Have low blood counts (anemia). What are the signs or symptoms? Symptoms of this condition include:  Shortness of breath with activity, such as when climbing stairs.  A cough that does not  go away.  Swelling of the feet, ankles, legs, or abdomen.  Losing weight for no reason.  Trouble breathing when lying flat (orthopnea).  Waking from sleep because of the need to sit up and get more air.  Rapid heartbeat.  Tiredness (fatigue) and loss of energy.  Feeling light-headed, dizzy, or close to fainting.  Loss of appetite.  Nausea.  Waking up more often during the night to urinate (nocturia).  Confusion. How is this diagnosed? This condition is diagnosed based on:  Your medical history, symptoms, and a physical exam.  Diagnostic tests, which may include: ? Echocardiogram. ? Electrocardiogram (ECG). ? Chest X-ray. ? Blood tests. ? Exercise stress test. ? Radionuclide scans. ? Cardiac catheterization and angiogram. How is this treated? Treatment for this condition is aimed at managing the symptoms of heart failure. Medicines Treatment may include medicines that:  Help lower blood pressure by relaxing (dilating) the blood vessels. These medicines are called ACE inhibitors (angiotensin-converting enzyme) and ARBs (angiotensin receptor blockers).  Cause the kidneys to remove salt and water from the blood through urination (diuretics).  Improve heart muscle strength and prevent the heart from beating too fast (beta blockers).  Increase the force of the heartbeat (digoxin). Healthy behavior changes     Treatment may also include making healthy lifestyle changes, such as:  Reaching and staying at a healthy weight.  Quitting smoking or chewing tobacco.  Eating heart-healthy foods.  Limiting or avoiding alcohol.  Stopping the use of illegal drugs.  Being physically active.  Other treatments   Other treatments may include:  Procedures to open blocked arteries or repair damaged valves.  Placing a pacemaker to improve heart function (cardiac resynchronization therapy).  Placing a device to treat serious abnormal heart rhythms (implantable cardioverter  defibrillator, or ICD).  Placing a device to improve the pumping ability of the heart (left ventricular assist device, or LVAD).  Receiving a healthy heart from a donor (heart transplant). This is done when other treatments have not helped. Follow these instructions at home:  Manage other health conditions as told by your health care provider. These may include hypertension, diabetes, thyroid disease, or abnormal heart rhythms.  Get ongoing education and support as needed. Learn as much as you can about heart failure.  Keep all follow-up visits as told by your health care provider. This is important. Summary  Heart failure is a condition in which the heart has trouble pumping blood because it has become weak or stiff.  This condition is caused by high blood pressure and other diseases of the heart and lungs.  Symptoms of this condition include shortness of breath, tiredness (fatigue), nausea, and swelling of the feet, ankles, legs, or abdomen.  Treatments for this condition may include medicines, lifestyle changes, and surgery.  Manage other health conditions as told by your health care provider. This information is not intended to replace advice given to you by your health care provider. Make sure you discuss any questions you have with your health care provider. Document Released: 06/27/2005 Document Revised: 09/14/2018 Document Reviewed: 09/14/2018 Elsevier Patient Education  2020 [employer]  

## 2019-07-08 NOTE — Discharge Summary (Signed)
[facility] NAME: Dymond Eye    MR#:  [id number]  DATE OF BIRTH:  1956-06-09  DATE OF ADMISSION:  07/04/2019 ADMITTING PHYSICIAN: Lorella Nimrod, MD  DATE OF DISCHARGE: 07/08/2019  PRIMARY CARE PHYSICIAN: Patient, No Pcp Per    ADMISSION DIAGNOSIS:  Noncompliance with renal dialysis ([place]) [Z91.15] Volume overload [E87.70] Dyspnea, unspecified type [R06.00] Chest pain, unspecified type [R07.9] Congestive heart failure, unspecified HF chronicity, unspecified heart failure type ([place]) [I50.9]  DISCHARGE DIAGNOSIS:  Principal Problem:   Volume overload Active Problems:   HLD (hyperlipidemia)   Hypertension   Substance induced mood disorder (HCC)   Antisocial personality disorder ([place])   Noncompliance with renal dialysis ([place])   ESRD on dialysis ([place])   Dyspnea   ESRD (end stage renal disease) on dialysis (HCC)   Congestive heart failure (HCC)   Acute on chronic respiratory failure with hypoxia (HCC)   Acute diastolic CHF (congestive heart failure) (HCC)   Anemia of chronic disease   Thrombocytopenia ([place])   SECONDARY DIAGNOSIS:   Past Medical History:  Diagnosis Date  . Depression   . Heart attack ([place])   . Hypertension   . PE (pulmonary embolism)   . Renal disorder     HOSPITAL COURSE:   1.  Acute hypoxic respiratory failure on presentation.  This has resolved and patient breathing comfortably on room air. 2.  Acute fluid overload and acute on chronic diastolic congestive heart failure.  This has improved with restarting dialysis. 3.  End-stage renal disease.  Case discussed with nephrology and patient does have an outpatient dialysis slot at [place]. [address] on Monday Wednesday Friday at 11 AM 4.  Essential hypertension on amlodipine, irbesartan and hydralazine 5.  History of pulmonary embolism.  Patient off anticoagulation at this time.  CT scan of the chest negative for pulmonary embolism. 6.  Anemia of  chronic disease.  Last hemoglobin 10.8 7.  Chronic thrombocytopenia.  Looking back at old records from 2012.  Patient has history of hepatitis C.  Also looking back at old records, hepatitis B core antibody was positive back in February 2020. 8.  Nausea vomiting on presentation secondary to uremia. 9.  Try to set up with new primary care physician. 10.  Chronic back pain on pain medications.  Patient has these prescriptions at home.  DISCHARGE CONDITIONS:   Satisfactory  CONSULTS OBTAINED:  Nephrology  DRUG ALLERGIES:   Allergies  Allergen Reactions  . Cyclobenzaprine Nausea Only  . Clonidine Other (See Comments)    Asymptomatic bradycardia to 38  . Furosemide Other (See Comments)    Caused renal failure  . Tylenol [Acetaminophen] Other (See Comments)    Reaction:  Pt states it bothers his liver    DISCHARGE MEDICATIONS:   Allergies as of 07/08/2019      Reactions   Cyclobenzaprine Nausea Only   Clonidine Other (See Comments)   Asymptomatic bradycardia to 38   Furosemide Other (See Comments)   Caused renal failure   Tylenol [acetaminophen] Other (See Comments)   Reaction:  Pt states it bothers his liver      Medication List    TAKE these medications   albuterol 108 (90 Base) MCG/ACT inhaler Commonly known as: VENTOLIN HFA Inhale 2 puffs into the lungs every 6 (six) hours as needed for wheezing or shortness of breath.   amLODipine 10 MG tablet Commonly known as: NORVASC Take 1 tablet (10 mg total) by mouth daily.  apixaban 2.5 MG Tabs tablet Commonly known as: Eliquis Take 1 tablet (2.5 mg total) by mouth 2 (two) times daily.   atorvastatin 40 MG tablet Commonly known as: LIPITOR Take 1 tablet (40 mg total) by mouth at bedtime.   Auryxia 1 GM 210 MG(Fe) tablet Generic drug: ferric citrate Take 210 mg by mouth See admin instructions. Take 1 tablet (210mg ) by mouth three times daily with meals and take 1 tablet (210mg ) by mouth with snacks   hydrALAZINE 100  MG tablet Commonly known as: APRESOLINE Take 1 tablet (100 mg total) by mouth 3 (three) times daily.   irbesartan 75 MG tablet Commonly known as: AVAPRO Take 1 tablet (75 mg total) by mouth at bedtime.   isosorbide mononitrate 60 MG 24 hr tablet Commonly known as: IMDUR Take 1 tablet (60 mg total) by mouth daily.   ondansetron 4 MG tablet Commonly known as: ZOFRAN Take 1 tablet (4 mg total) by mouth every 6 (six) hours as needed for nausea.   oxyCODONE 20 mg 12 hr tablet Commonly known as: OXYCONTIN Take 20 mg by mouth every 12 (twelve) hours.   oxyCODONE 5 MG immediate release tablet Commonly known as: Oxy IR/ROXICODONE Take 5-10 mg by mouth every 4 (four) hours as needed for severe pain.   polyethylene glycol 17 g packet Commonly known as: MIRALAX / GLYCOLAX Take 17 g by mouth daily as needed for mild constipation.   sevelamer carbonate 800 MG tablet Commonly known as: RENVELA Take 1 tablet (800 mg total) by mouth 3 (three) times daily with meals.   tamsulosin 0.4 MG Caps capsule Commonly known as: FLOMAX Take 1 capsule (0.4 mg total) by mouth daily.        DISCHARGE INSTRUCTIONS:   Follow-up with dialysis on Monday Wednesday Friday at [facility]. [address]. at 11 AM Set up appointment with new PMD  If you experience worsening of your admission symptoms, develop shortness of breath, life threatening emergency, suicidal or homicidal thoughts you must seek medical attention immediately by calling 911 or calling your MD immediately  if symptoms less severe.  You Must read complete instructions/literature along with all the possible adverse reactions/side effects for all the Medicines you take and that have been prescribed to you. Take any new Medicines after you have completely understood and accept all the possible adverse reactions/side effects.   Please note  You were cared for by a hospitalist during your hospital stay. If you have any questions about your  discharge medications or the care you received while you were in the hospital after you are discharged, you can call the unit and asked to speak with the hospitalist on call if the hospitalist that took care of you is not available. Once you are discharged, your primary care physician will handle any further medical issues. Please note that NO REFILLS for any discharge medications will be authorized once you are discharged, as it is imperative that you return to your primary care physician (or establish a relationship with a primary care physician if you do not have one) for your aftercare needs so that they can reassess your need for medications and monitor your lab values.    Today   CHIEF COMPLAINT:   Chief Complaint  Patient presents with  . Chest Pain  . Shortness of Breath    HISTORY OF PRESENT ILLNESS:  Marden Tung  is a 63 y.o. male came in with chest pain and shortness of breath.   VITAL SIGNS:  Blood pressure 117/70, pulse 87, temperature 97.7 F (36.5 C), temperature source Oral, resp. rate 16, height 6' (1.829 m), weight 110 kg, SpO2 96 %.  The vital sign of a pulse of 184 was a typo.  He did not have a pulse this high on dialysis.  His heart rate ranged between 84 and 96. PHYSICAL EXAMINATION:  GENERAL:  63 y.o.-year-old patient lying in the bed with no acute distress.  EYES: Pupils equal, round, reactive to light and accommodation. No scleral icterus. Extraocular muscles intact.  HEENT: Head atraumatic, normocephalic. Oropharynx and nasopharynx clear.  NECK:  Supple, no jugular venous distention. No thyroid enlargement, no tenderness.  LUNGS: Normal breath sounds bilaterally, no wheezing, rales,rhonchi or crepitation. No use of accessory muscles of respiration.  CARDIOVASCULAR: S1, S2 normal.  2 out of 6 to murmurs.  No rubs, or gallops.  ABDOMEN: Soft, non-tender, non-distended. Bowel sounds present. No organomegaly or mass.  EXTREMITIES: Trace pedal edema.no  cyanosis, or clubbing.  NEUROLOGIC: Cranial nerves II through XII are intact. Muscle strength 5/5 in all extremities. Sensation intact. Gait not checked.  PSYCHIATRIC: The patient is alert and oriented x 3.  SKIN: No obvious rash, lesion, or ulcer.   DATA REVIEW:   CBC Recent Labs  Lab 07/08/19 0406  WBC 6.0  HGB 10.8*  HCT 33.0*  PLT 113*    Chemistries  Recent Labs  Lab 07/08/19 0406  NA 135  K 3.8  CL 98  CO2 25  GLUCOSE 109*  BUN 38*  CREATININE 9.10*  CALCIUM 8.9  MG 2.2  AST 14*  ALT 13  ALKPHOS 45  BILITOT 0.9    Microbiology Results  Results for orders placed or performed during the hospital encounter of 07/04/19  Respiratory Panel by RT PCR (Flu A&B, Covid) - Nasopharyngeal Swab     Status: None   Collection Time: 07/04/19 12:09 PM   Specimen: Nasopharyngeal Swab  Result Value Ref Range Status   SARS Coronavirus 2 by RT PCR NEGATIVE NEGATIVE Final    Comment: (NOTE) SARS-CoV-2 target nucleic acids are NOT DETECTED. The SARS-CoV-2 RNA is generally detectable in upper respiratoy specimens during the acute phase of infection. The lowest concentration of SARS-CoV-2 viral copies this assay can detect is 131 copies/mL. A negative result does not preclude SARS-Cov-2 infection and should not be used as the sole basis for treatment or other patient management decisions. A negative result may occur with  improper specimen collection/handling, submission of specimen other than nasopharyngeal swab, presence of viral mutation(s) within the areas targeted by this assay, and inadequate number of viral copies (<131 copies/mL). A negative result must be combined with clinical observations, patient history, and epidemiological information. The expected result is Negative. Fact Sheet for Patients:  PinkCheek.be Fact Sheet for Healthcare Providers:  GravelBags.it This test is not yet ap proved or cleared by the  [place] FDA and  has been authorized for detection and/or diagnosis of SARS-CoV-2 by FDA under an Emergency Use Authorization (EUA). This EUA will remain  in effect (meaning this test can be used) for the duration of the COVID-19 declaration under Section 564(b)(1) of the Act, 21 U.S.C. section 360bbb-3(b)(1), unless the authorization is terminated or revoked sooner.    Influenza A by PCR NEGATIVE NEGATIVE Final   Influenza B by PCR NEGATIVE NEGATIVE Final    Comment: (NOTE) The Xpert Xpress SARS-CoV-2/FLU/RSV assay is intended as an aid in  the diagnosis of influenza from Nasopharyngeal swab specimens and  should not  be used as a sole basis for treatment. Nasal washings and  aspirates are unacceptable for Xpert Xpress SARS-CoV-2/FLU/RSV  testing. Fact Sheet for Patients: PinkCheek.be Fact Sheet for Healthcare Providers: GravelBags.it This test is not yet approved or cleared by the [place] FDA and  has been authorized for detection and/or diagnosis of SARS-CoV-2 by  FDA under an Emergency Use Authorization (EUA). This EUA will remain  in effect (meaning this test can be used) for the duration of the  Covid-19 declaration under Section 564(b)(1) of the Act, 21  U.S.C. section 360bbb-3(b)(1), unless the authorization is  terminated or revoked. Performed at [facility], [address] [place], [place] [address]   MRSA PCR Screening     Status: None   Collection Time: 07/05/19  2:46 AM   Specimen: Nasal Mucosa; Nasopharyngeal  Result Value Ref Range Status   MRSA by PCR NEGATIVE NEGATIVE Final    Comment:        The GeneXpert MRSA Assay (FDA approved for NASAL specimens only), is one component of a comprehensive MRSA colonization surveillance program. It is not intended to diagnose MRSA infection nor to guide or monitor treatment for MRSA infections. Performed at [facility], [address], [place], [place] [address]       Management plans discussed with the patient, and he is in agreement.  CODE STATUS:     Code Status Orders  (From admission, onward)         Start     Ordered   07/04/19 1115  Full code  Continuous     07/04/19 1117        Code Status History    Date Active Date Inactive Code Status Order ID Comments User Context   06/21/2019 2358 06/23/2019 1718 Full Code [id number]  Gwynne Edinger, MD ED   11/14/2018 0355 11/16/2018 2033 Full Code [id number]  Harrie Foreman, MD Inpatient   08/27/2018 1828 08/30/2018 1856 Full Code [id number]  Salary, Avel Peace, MD Inpatient   08/25/2018 0802 08/26/2018 1333 Full Code [id number]  Mayo, Pete Pelt, MD ED   08/20/2018 0400 08/21/2018 1823 Full Code [id number]  Harrie Foreman, MD Inpatient   08/15/2018 0058 08/18/2018 1624 Full Code [id number]  Lance Coon, MD Inpatient   09/21/2016 1519 09/27/2016 2042 Full Code [id number]  Hillary Bow, MD ED   04/28/2016 2325 04/29/2016 2043 Full Code [id number]  Muthersbaugh, Gwenlyn Perking ED   01/18/2016 0202 01/18/2016 1848 Full Code [id number]  Quintella Baton, MD Inpatient   Advance Care Planning Activity      TOTAL TIME TAKING CARE OF THIS PATIENT: 33 minutes.    Loletha Grayer M.D on 07/08/2019 at 4:14 PM  Between 7am to 6pm - Pager - [phone]  After 6pm go to [email or web address] - password [email or web address]  [facility]  CC: Primary care physician; Patient, No Pcp Per

## 2019-07-08 NOTE — Care Management (Addendum)
RNCM notified Estill Bamberg with [employer] (dialysis coordinator) of patient's hospitalization. Update from [place]: patient can go to [facility] starting Wednesday 07/10/19 for M, W, F at 11AM schedule.

## 2019-07-08 NOTE — Progress Notes (Signed)
This note also relates to the following rows which could not be included: Pulse Rate - Cannot attach notes to unvalidated device data Resp - Cannot attach notes to unvalidated device data BP - Cannot attach notes to unvalidated device data  Hd completed  

## 2019-07-13 ENCOUNTER — Emergency Department: Payer: Medicare Other

## 2019-07-13 ENCOUNTER — Other Ambulatory Visit: Payer: Self-pay

## 2019-07-13 DIAGNOSIS — F17210 Nicotine dependence, cigarettes, uncomplicated: Secondary | ICD-10-CM | POA: Insufficient documentation

## 2019-07-13 DIAGNOSIS — Z7901 Long term (current) use of anticoagulants: Secondary | ICD-10-CM | POA: Insufficient documentation

## 2019-07-13 DIAGNOSIS — N186 End stage renal disease: Secondary | ICD-10-CM | POA: Insufficient documentation

## 2019-07-13 DIAGNOSIS — G894 Chronic pain syndrome: Secondary | ICD-10-CM | POA: Diagnosis not present

## 2019-07-13 DIAGNOSIS — I252 Old myocardial infarction: Secondary | ICD-10-CM | POA: Insufficient documentation

## 2019-07-13 DIAGNOSIS — Z992 Dependence on renal dialysis: Secondary | ICD-10-CM | POA: Insufficient documentation

## 2019-07-13 DIAGNOSIS — I5032 Chronic diastolic (congestive) heart failure: Secondary | ICD-10-CM | POA: Diagnosis not present

## 2019-07-13 DIAGNOSIS — Z79899 Other long term (current) drug therapy: Secondary | ICD-10-CM | POA: Insufficient documentation

## 2019-07-13 DIAGNOSIS — R778 Other specified abnormalities of plasma proteins: Secondary | ICD-10-CM | POA: Diagnosis not present

## 2019-07-13 DIAGNOSIS — I132 Hypertensive heart and chronic kidney disease with heart failure and with stage 5 chronic kidney disease, or end stage renal disease: Secondary | ICD-10-CM | POA: Diagnosis not present

## 2019-07-13 DIAGNOSIS — R0789 Other chest pain: Secondary | ICD-10-CM | POA: Diagnosis not present

## 2019-07-13 DIAGNOSIS — R079 Chest pain, unspecified: Secondary | ICD-10-CM | POA: Diagnosis present

## 2019-07-13 LAB — CBC
HCT: 32.8 % — ABNORMAL LOW (ref 39.0–52.0)
Hemoglobin: 10.8 g/dL — ABNORMAL LOW (ref 13.0–17.0)
MCH: 29.4 pg (ref 26.0–34.0)
MCHC: 32.9 g/dL (ref 30.0–36.0)
MCV: 89.4 fL (ref 80.0–100.0)
Platelets: 132 10*3/uL — ABNORMAL LOW (ref 150–400)
RBC: 3.67 MIL/uL — ABNORMAL LOW (ref 4.22–5.81)
RDW: 15 % (ref 11.5–15.5)
WBC: 5.9 10*3/uL (ref 4.0–10.5)
nRBC: 0 % (ref 0.0–0.2)

## 2019-07-13 LAB — BASIC METABOLIC PANEL
Anion gap: 16 — ABNORMAL HIGH (ref 5–15)
BUN: 77 mg/dL — ABNORMAL HIGH (ref 8–23)
CO2: 25 mmol/L (ref 22–32)
Calcium: 8.5 mg/dL — ABNORMAL LOW (ref 8.9–10.3)
Chloride: 98 mmol/L (ref 98–111)
Creatinine, Ser: 12.19 mg/dL — ABNORMAL HIGH (ref 0.61–1.24)
GFR calc Af Amer: 5 mL/min — ABNORMAL LOW (ref >60)
GFR calc non Af Amer: 4 mL/min — ABNORMAL LOW (ref >60)
Glucose, Bld: 105 mg/dL — ABNORMAL HIGH (ref 70–99)
Potassium: 4.3 mmol/L (ref 3.5–5.1)
Sodium: 139 mmol/L (ref 135–145)

## 2019-07-13 LAB — TROPONIN I (HIGH SENSITIVITY): Troponin I (High Sensitivity): 89 ng/L — ABNORMAL HIGH (ref <18)

## 2019-07-13 NOTE — ED Triage Notes (Signed)
Pt arrived via POV with reports of chest pain that started today, pt states the pain is in the central chest and non-radiating.  Pt missed dialysis Thursday. Last treatment was on 12/28

## 2019-07-14 ENCOUNTER — Emergency Department
Admission: EM | Admit: 2019-07-14 | Discharge: 2019-07-14 | Disposition: A | Discharge status: Home / Self Care | Payer: Medicare Other | Attending: Emergency Medicine | Admitting: Emergency Medicine

## 2019-07-14 DIAGNOSIS — R0789 Other chest pain: Secondary | ICD-10-CM

## 2019-07-14 DIAGNOSIS — N186 End stage renal disease: Secondary | ICD-10-CM

## 2019-07-14 DIAGNOSIS — G894 Chronic pain syndrome: Secondary | ICD-10-CM

## 2019-07-14 DIAGNOSIS — R7989 Other specified abnormal findings of blood chemistry: Secondary | ICD-10-CM

## 2019-07-14 DIAGNOSIS — R778 Other specified abnormalities of plasma proteins: Secondary | ICD-10-CM

## 2019-07-14 LAB — TROPONIN I (HIGH SENSITIVITY): Troponin I (High Sensitivity): 89 ng/L — ABNORMAL HIGH (ref <18)

## 2019-07-14 MED ORDER — OXYCODONE HCL ER 10 MG PO T12A
20.0000 mg | EXTENDED_RELEASE_TABLET | ORAL | Status: AC
  Administered 2019-07-14: 20 mg via ORAL
  Filled 2019-07-14: qty 2

## 2019-07-14 MED ORDER — OXYCODONE HCL ER 20 MG PO T12A: 20.0000 mg | EXTENDED_RELEASE_TABLET | Freq: Two times a day (BID) | ORAL | 0 refills | Status: AC

## 2019-07-14 MED ORDER — OXYCODONE HCL 5 MG PO TABS
5.0000 mg | ORAL_TABLET | ORAL | Status: AC
  Administered 2019-07-14: 5 mg via ORAL
  Filled 2019-07-14: qty 1

## 2019-07-14 MED ORDER — OXYCODONE HCL 5 MG PO CAPS: 5.0000 mg | ORAL_CAPSULE | Freq: Two times a day (BID) | ORAL | 0 refills | Status: AC | PRN

## 2019-07-14 NOTE — ED Provider Notes (Signed)
[facility] Emergency Department Provider Note  ____________________________________________   First MD Initiated Contact with Patient 07/14/19 0205     (approximate)  I have reviewed the triage vital signs and the nursing notes.   HISTORY  Chief Complaint Chest Pain    HPI [name] is a 64 y.o. male with medical history as listed below which most notably includes end-stage renal disease on hemodialysis.  The patient reports   that he has missed the last 2 doses of dialysis because he did not feel well and did not feel like going.  He said that he developed some chest pain when he woke up at 7 AM, almost 24 hours ago, and it is not gone away.  He said that it hurts a little bit more if he pushes in the middle of the chest and it feels like a sharp pain, nothing else in particular makes it better or worse.  He denies fever/chills, shortness of breath, cough, nausea, vomiting, and abdominal pain.  He has no numbness nor weakness in his extremities.  He says that he knows where to go for dialysis and knows that he needs to go.  He also said that he has a primary care doctor but has not seen them yet since moving to [place].  After reviewing his medical record and his prescription history from [place], asking about his chronic pain medicine.  For years he has been receiving prescriptions of 60 tablets of OxyContin extended release 20 mg and 60 tablets of oxycodone immediate release 5 mg every month.  He said that he has not had a prescription since last month and that he ran out of his medicine.  The pain started in his chest after he ran out of his regular pain medicine.        Past Medical History:  Diagnosis Date  . Depression   . Heart attack ([place])   . Hypertension   . PE (pulmonary embolism)   . Renal disorder     Patient Active Problem List   Diagnosis Date Noted  . Chronic midline low back pain   . Acute respiratory failure with hypoxia  ([place])   . Acute diastolic CHF (congestive heart failure) ([place])   . Anemia of chronic disease   . Thrombocytopenia ([place])   . Congestive heart failure ([place])   . Fluid overload 06/21/2019  . Volume overload 11/15/2018  . ESRD (end stage renal disease) on dialysis ([place]) 11/14/2018  . HCAP (healthcare-associated pneumonia) 08/27/2018  . Dyspnea 08/25/2018  . Chest pain 08/20/2018  . ESRD on dialysis ([place]) 11/08/2016  . Prostate cancer screening 11/08/2016  . Acquired cyst of kidney 11/08/2016  . Urinary urgency 11/08/2016  . Acute on chronic renal failure ([place]) 09/23/2016  . Hypertensive urgency 09/21/2016  . Dysthymia 02/26/2016  . Chronic pain 02/26/2016  . Noncompliance with renal dialysis ([place]) 02/26/2016  . Opiate abuse, continuous ([place]) 02/03/2016  . Substance induced mood disorder ([place]) 02/03/2016  . Antisocial personality disorder ([place]) 02/03/2016  . Left-sided weakness 01/18/2016  . HEPATITIS C 06/28/2008  . HLD (hyperlipidemia) 06/28/2008  . SUBSTANCE ABUSE, MULTIPLE 06/28/2008  . Hypertension 06/27/2008    Past Surgical History:  Procedure Laterality Date  . TOTAL HIP ARTHROPLASTY      Prior to Admission medications   Medication Sig Start Date End Date Taking? Authorizing Provider  albuterol (VENTOLIN HFA) 108 (90 Base) MCG/ACT inhaler Inhale 2 puffs into the lungs every 6 (six) hours as needed for  wheezing or shortness of breath. 06/23/19   Jennye Boroughs, MD  amLODipine (NORVASC) 10 MG tablet Take 1 tablet (10 mg total) by mouth daily. 06/23/19 06/22/20  Jennye Boroughs, MD  apixaban (ELIQUIS) 2.5 MG TABS tablet Take 1 tablet (2.5 mg total) by mouth 2 (two) times daily. 06/23/19   Jennye Boroughs, MD  atorvastatin (LIPITOR) 40 MG tablet Take 1 tablet (40 mg total) by mouth at bedtime. 06/23/19 06/22/20  Jennye Boroughs, MD  AURYXIA 1 GM 210 MG(Fe) tablet Take 210 mg by mouth See admin instructions. Take 1 tablet (210mg ) by mouth three times daily with meals and take 1  tablet (210mg ) by mouth with snacks 06/28/19   [provider]  hydrALAZINE (APRESOLINE) 100 MG tablet Take 1 tablet (100 mg total) by mouth 3 (three) times daily. 06/23/19   Jennye Boroughs, MD  irbesartan (AVAPRO) 75 MG tablet Take 1 tablet (75 mg total) by mouth at bedtime. 06/23/19   Jennye Boroughs, MD  isosorbide mononitrate (IMDUR) 60 MG 24 hr tablet Take 1 tablet (60 mg total) by mouth daily. 06/23/19   Jennye Boroughs, MD  ondansetron (ZOFRAN) 4 MG tablet Take 1 tablet (4 mg total) by mouth every 6 (six) hours as needed for nausea. 07/08/19   Loletha Grayer, MD  oxycodone (OXY-IR) 5 MG capsule Take 1 capsule (5 mg total) by mouth every 12 (twelve) hours as needed for up to 7 days for pain (severe pain). 07/14/19 07/21/19  Hinda Kehr, MD  oxyCODONE (OXYCONTIN) 20 mg 12 hr tablet Take 1 tablet (20 mg total) by mouth every 12 (twelve) hours for 7 days. 07/14/19 07/21/19  Hinda Kehr, MD  polyethylene glycol (MIRALAX / GLYCOLAX) 17 g packet Take 17 g by mouth daily as needed for mild constipation. 07/08/19   Loletha Grayer, MD  sevelamer carbonate (RENVELA) 800 MG tablet Take 1 tablet (800 mg total) by mouth 3 (three) times daily with meals. 06/23/19   Jennye Boroughs, MD  tamsulosin (FLOMAX) 0.4 MG CAPS capsule Take 1 capsule (0.4 mg total) by mouth daily. 09/27/16   Gladstone Lighter, MD    Allergies Cyclobenzaprine, Clonidine, Furosemide, and Tylenol [acetaminophen]  No family history on file.  Social History Social History   Tobacco Use  . Smoking status: Current Every Day Smoker    Packs/day: 0.50    Types: Cigarettes  . Smokeless tobacco: Never Used  Substance Use Topics  . Alcohol use: No  . Drug use: No    Comment: pt has been positive for cocaine twice this week but denies use    Review of Systems Constitutional: No fever/chills Eyes: No visual changes. ENT: No sore throat. Cardiovascular: +chest pain. Respiratory: Denies shortness of  breath. Gastrointestinal: No abdominal pain.  No nausea, no vomiting.  No diarrhea.  No constipation. Genitourinary: Negative for dysuria. Musculoskeletal: Negative for neck pain.  Negative for back pain. Integumentary: Negative for rash. Neurological: Negative for headaches, focal weakness or numbness.   ____________________________________________   PHYSICAL EXAM:  VITAL SIGNS: ED Triage Vitals  Enc Vitals Group     BP 07/13/19 2109 (!) 160/93     Pulse Rate 07/13/19 2109 77     Resp 07/13/19 2109 20     Temp 07/13/19 2109 98 F (36.7 C)     Temp Source 07/13/19 2109 Oral     SpO2 07/13/19 2109 99 %     Weight 07/13/19 2109 110 kg (242 lb 8.1 oz)     Height 07/13/19 2109 1.829 m (6')  Head Circumference --      Peak Flow --      Pain Score 07/13/19 2108 8     Pain Loc --      Pain Edu? --      Excl. in [place]? --     Constitutional: Alert and oriented.  No respiratory distress. Eyes: Conjunctivae are normal.  Head: Atraumatic. Nose: No congestion/rhinnorhea. Mouth/Throat: Patient is wearing a mask. Neck: No stridor.  No meningeal signs.   Cardiovascular: Normal rate, regular rhythm. Good peripheral circulation. Grossly normal heart sounds. Respiratory: Normal respiratory effort.  No retractions. Gastrointestinal: Soft and nontender. No distention.  Musculoskeletal: Reproducible tenderness to the center of the patient's chest.  No lower extremity tenderness nor edema. No gross deformities of extremities. Neurologic:  Normal speech and language. No gross focal neurologic deficits are appreciated.  Skin:  Skin is warm, dry and intact. Psychiatric: Mood and affect are normal. Speech and behavior are normal.  ____________________________________________   LABS (all labs ordered are listed, but only abnormal results are displayed)  Labs Reviewed  BASIC METABOLIC PANEL - Abnormal; Notable for the following components:      Result Value   Glucose, Bld 105 (*)    BUN  77 (*)    Creatinine, Ser 12.19 (*)    Calcium 8.5 (*)    GFR calc non Af Amer 4 (*)    GFR calc Af Amer 5 (*)    Anion gap 16 (*)    All other components within normal limits  CBC - Abnormal; Notable for the following components:   RBC 3.67 (*)    Hemoglobin 10.8 (*)    HCT 32.8 (*)    Platelets 132 (*)    All other components within normal limits  TROPONIN I (HIGH SENSITIVITY) - Abnormal; Notable for the following components:   Troponin I (High Sensitivity) 89 (*)    All other components within normal limits  TROPONIN I (HIGH SENSITIVITY) - Abnormal; Notable for the following components:   Troponin I (High Sensitivity) 89 (*)    All other components within normal limits   ____________________________________________  EKG  ED ECG REPORT I, Hinda Kehr, the attending physician, personally viewed and interpreted this ECG.  Date: 07/13/2019 EKG Time: 21: 02 Rate: 75 Rhythm: normal sinus rhythm QRS Axis: normal Intervals: LVH ST/T Wave abnormalities: Non-specific ST segment / T-wave changes, but no clear evidence of acute ischemia. Narrative Interpretation: no definitive evidence of acute ischemia; does not meet STEMI criteria.   ____________________________________________  RADIOLOGY I, Hinda Kehr, personally viewed and evaluated these images (plain radiographs) as part of my medical decision making, as well as reviewing the written report by the radiologist.  ED MD interpretation: No evidence of pulmonary edema  Official radiology report(s): DG Chest 2 View  Result Date: 07/13/2019 CLINICAL DATA:  Chest pain EXAM: CHEST - 2 VIEW COMPARISON:  07/05/2019 FINDINGS: Cardiomegaly. Both lungs are clear. The visualized skeletal structures are unremarkable. IMPRESSION: Cardiomegaly without acute abnormality of the lungs. Electronically Signed   By: Eddie Candle M.D.   On: 07/13/2019 21:39    ____________________________________________   PROCEDURES   Procedure(s)  performed (including Critical Care):  Procedures   ____________________________________________   INITIAL IMPRESSION / MDM / [place] / ED COURSE  As part of my medical decision making, I reviewed the following data within the [place] notes reviewed and incorporated, Labs reviewed , EKG interpreted , Old chart reviewed, Radiograph reviewed , Notes from  prior ED visits and [place] Controlled Substance Database   Differential diagnosis includes, but is not limited to, acute on chronic pain with chronic opioid dependence, community-acquired or healthcare associated pneumonia, COVID-19, ACS, PE, pneumothorax.  The patient is actually quite well-appearing at this time in spite of his extensive chronic medical history.  His vital signs are stable and reassuring.  In spite of missing 2 courses of dialysis, his lab work demonstrates his chronic kidney disease but also demonstrates normal potassium and an elevated troponin of 89 which was stable after a repeat troponin level.  This is very reassuring in the setting of chest pain that has been going on for almost 24 hours and I believe it is not representative of ACS.  His CBC is within normal limits and there is no pulmonary edema on his chest x-ray.  His chest pain is mildly reproducible with pressure to the anterior chest wall.  I discussed all of this with the patient and he agrees that his chest pain started after he ran out of his pain medicine.  I believe that this patient is at substantial risk of contracting COVID-19 while in the emergency department and that there is no benefit to admitting him to the hospital at this time.  We discussed other options and I agreed to give him a dose of his pain medicine tonight and write a very small prescription for his pain medicine to tide him over until he can see or at least discuss it with his primary care doctor and/or the pain management clinic.  I made it very clear that the  emergency department cannot manage his chronic pain and will not provide any additional prescriptions, but that I am providing the medication and prescriptions tonight and attempt to keep him as healthy as possible and out of the emergency department unless it is absolutely necessary for him to be here.  He states that he understands and also states that he will go to dialysis on Monday as scheduled.  I gave my usual and customary return precautions and he understands and agrees with the plan.     ____________________________________________  FINAL CLINICAL IMPRESSION(S) / ED DIAGNOSES  Final diagnoses:  Atypical chest pain  Chronic pain syndrome  ESRD on hemodialysis ([place])     MEDICATIONS GIVEN DURING THIS VISIT:  Medications  oxyCODONE (OXYCONTIN) 12 hr tablet 20 mg (20 mg Oral Given 07/14/19 0254)  oxyCODONE (Oxy IR/ROXICODONE) immediate release tablet 5 mg (5 mg Oral Given 07/14/19 0254)     ED Discharge Orders         Ordered    oxyCODONE (OXYCONTIN) 20 mg 12 hr tablet  Every 12 hours     07/14/19 0249    oxycodone (OXY-IR) 5 MG capsule  Every 12 hours PRN     07/14/19 0249          *Please note:  [name] was evaluated in Emergency Department on 07/14/2019 for the symptoms described in the history of present illness. He was evaluated in the context of the global COVID-19 pandemic, which necessitated consideration that the patient might be at risk for infection with the SARS-CoV-2 virus that causes COVID-19. Institutional protocols and algorithms that pertain to the evaluation of patients at risk for COVID-19 are in a state of rapid change based on information released by regulatory bodies including the CDC and federal and state organizations. These policies and algorithms were followed during the patient's care in the ED.  Some ED evaluations and  interventions may be delayed as a result of limited staffing during the pandemic.*  Note:  This document was prepared using  Dragon voice recognition software and may include unintentional dictation errors.   Hinda Kehr, MD 07/14/19 [phone]

## 2019-07-14 NOTE — ED Notes (Signed)
Patient pulled out own IV and then walked out of treatment room to lobby with discharge instructions even though requested to wait by this RN.

## 2019-07-14 NOTE — Discharge Instructions (Signed)
As we discussed, in spite of missing 2 dialysis sessions, your work-up today was reassuring.  There is no evidence that you need to stay in the hospital at this time. YOU MUST GO TO DIALYSIS AS SCHEDULED.  If you keep missing dialysis, you are only going to get more and more ill.  As we also discussed, I believe that the reason you are hurting now is because you have run out of your chronic narcotics that were prescribed to you by your doctor in [place].  I gave you a dose of medication in the ED and a small prescription to help you in the short-term, but YOU MUST FIND A CLINIC DOCTOR TO PRESCRIBE YOUR PAIN MEDICATION.  The Emergency Department cannot provide prescriptions for chronic pain management.  Follow-up with the primary care provider you were assigned, call the number I provided if you need a new one, and/or contact the [facility] pain management clinic.    Return to the emergency department if you develop new or worsening symptoms that concern you.

## 2019-07-14 NOTE — ED Notes (Addendum)
ED Provider [place] at bedside. 

## 2019-07-14 NOTE — ED Notes (Addendum)
Pt provided with phone to call a ride home. Pt calling POC on file [phone].

## 2019-07-14 NOTE — ED Notes (Signed)
Pt confirms ride home at this time.

## 2019-07-14 NOTE — ED Notes (Addendum)
Pt st last dialysis treatment on Monday 12/28. Pt st "missed 2 treatments" due to "not feeling well" and weakness since Monday.  Pt st SOB and central CP woke him up this morning at 0700 hrs. Pt denies N/V/dizziness. Pt denies cough, fever at home.

## 2019-09-27 ENCOUNTER — Emergency Department: Payer: Medicare Other

## 2019-09-27 ENCOUNTER — Other Ambulatory Visit: Payer: Self-pay

## 2019-09-27 ENCOUNTER — Inpatient Hospital Stay
Admission: EM | Admit: 2019-09-27 | Discharge: 2019-09-30 | DRG: 286 | Disposition: A | Discharge status: Home / Self Care | Payer: Medicare Other | Source: Ambulatory Visit | Attending: Internal Medicine | Admitting: Internal Medicine

## 2019-09-27 ENCOUNTER — Encounter: Payer: Self-pay | Admitting: Emergency Medicine

## 2019-09-27 DIAGNOSIS — N186 End stage renal disease: Secondary | ICD-10-CM

## 2019-09-27 DIAGNOSIS — Z20822 Contact with and (suspected) exposure to covid-19: Secondary | ICD-10-CM | POA: Diagnosis present

## 2019-09-27 DIAGNOSIS — Z886 Allergy status to analgesic agent status: Secondary | ICD-10-CM

## 2019-09-27 DIAGNOSIS — I5032 Chronic diastolic (congestive) heart failure: Secondary | ICD-10-CM

## 2019-09-27 DIAGNOSIS — R079 Chest pain, unspecified: Secondary | ICD-10-CM | POA: Diagnosis not present

## 2019-09-27 DIAGNOSIS — Z888 Allergy status to other drugs, medicaments and biological substances status: Secondary | ICD-10-CM

## 2019-09-27 DIAGNOSIS — R0789 Other chest pain: Secondary | ICD-10-CM | POA: Diagnosis not present

## 2019-09-27 DIAGNOSIS — Z881 Allergy status to other antibiotic agents status: Secondary | ICD-10-CM

## 2019-09-27 DIAGNOSIS — E785 Hyperlipidemia, unspecified: Secondary | ICD-10-CM | POA: Diagnosis present

## 2019-09-27 DIAGNOSIS — I252 Old myocardial infarction: Secondary | ICD-10-CM

## 2019-09-27 DIAGNOSIS — I700 Atherosclerosis of aorta: Secondary | ICD-10-CM | POA: Diagnosis present

## 2019-09-27 DIAGNOSIS — J438 Other emphysema: Secondary | ICD-10-CM | POA: Diagnosis present

## 2019-09-27 DIAGNOSIS — J432 Centrilobular emphysema: Secondary | ICD-10-CM | POA: Diagnosis present

## 2019-09-27 DIAGNOSIS — Z86711 Personal history of pulmonary embolism: Secondary | ICD-10-CM

## 2019-09-27 DIAGNOSIS — Z8249 Family history of ischemic heart disease and other diseases of the circulatory system: Secondary | ICD-10-CM

## 2019-09-27 DIAGNOSIS — Z992 Dependence on renal dialysis: Secondary | ICD-10-CM

## 2019-09-27 DIAGNOSIS — Z8701 Personal history of pneumonia (recurrent): Secondary | ICD-10-CM

## 2019-09-27 DIAGNOSIS — D631 Anemia in chronic kidney disease: Secondary | ICD-10-CM | POA: Diagnosis present

## 2019-09-27 DIAGNOSIS — Z9114 Patient's other noncompliance with medication regimen: Secondary | ICD-10-CM

## 2019-09-27 DIAGNOSIS — Z7901 Long term (current) use of anticoagulants: Secondary | ICD-10-CM

## 2019-09-27 DIAGNOSIS — Z79899 Other long term (current) drug therapy: Secondary | ICD-10-CM

## 2019-09-27 DIAGNOSIS — I10 Essential (primary) hypertension: Secondary | ICD-10-CM | POA: Diagnosis present

## 2019-09-27 DIAGNOSIS — I2510 Atherosclerotic heart disease of native coronary artery without angina pectoris: Secondary | ICD-10-CM | POA: Diagnosis present

## 2019-09-27 DIAGNOSIS — G8929 Other chronic pain: Secondary | ICD-10-CM | POA: Diagnosis present

## 2019-09-27 DIAGNOSIS — N185 Chronic kidney disease, stage 5: Secondary | ICD-10-CM

## 2019-09-27 DIAGNOSIS — I132 Hypertensive heart and chronic kidney disease with heart failure and with stage 5 chronic kidney disease, or end stage renal disease: Secondary | ICD-10-CM | POA: Diagnosis present

## 2019-09-27 DIAGNOSIS — F17210 Nicotine dependence, cigarettes, uncomplicated: Secondary | ICD-10-CM | POA: Diagnosis present

## 2019-09-27 LAB — CBC WITH DIFFERENTIAL/PLATELET
Abs Immature Granulocytes: 0.03 10*3/uL (ref 0.00–0.07)
Basophils Absolute: 0 10*3/uL (ref 0.0–0.1)
Basophils Relative: 1 %
Eosinophils Absolute: 0.1 10*3/uL (ref 0.0–0.5)
Eosinophils Relative: 2 %
HCT: 26.7 % — ABNORMAL LOW (ref 39.0–52.0)
Hemoglobin: 8.9 g/dL — ABNORMAL LOW (ref 13.0–17.0)
Immature Granulocytes: 1 %
Lymphocytes Relative: 24 %
Lymphs Abs: 1.2 10*3/uL (ref 0.7–4.0)
MCH: 30.3 pg (ref 26.0–34.0)
MCHC: 33.3 g/dL (ref 30.0–36.0)
MCV: 90.8 fL (ref 80.0–100.0)
Monocytes Absolute: 0.4 10*3/uL (ref 0.1–1.0)
Monocytes Relative: 9 %
Neutro Abs: 3.1 10*3/uL (ref 1.7–7.7)
Neutrophils Relative %: 63 %
Platelets: 120 10*3/uL — ABNORMAL LOW (ref 150–400)
RBC: 2.94 MIL/uL — ABNORMAL LOW (ref 4.22–5.81)
RDW: 13.5 % (ref 11.5–15.5)
WBC: 4.9 10*3/uL (ref 4.0–10.5)
nRBC: 0 % (ref 0.0–0.2)

## 2019-09-27 LAB — BASIC METABOLIC PANEL
Anion gap: 8 (ref 5–15)
BUN: 29 mg/dL — ABNORMAL HIGH (ref 8–23)
CO2: 31 mmol/L (ref 22–32)
Calcium: 8.1 mg/dL — ABNORMAL LOW (ref 8.9–10.3)
Chloride: 102 mmol/L (ref 98–111)
Creatinine, Ser: 5.9 mg/dL — ABNORMAL HIGH (ref 0.61–1.24)
GFR calc Af Amer: 11 mL/min — ABNORMAL LOW (ref >60)
GFR calc non Af Amer: 9 mL/min — ABNORMAL LOW (ref >60)
Glucose, Bld: 121 mg/dL — ABNORMAL HIGH (ref 70–99)
Potassium: 3.6 mmol/L (ref 3.5–5.1)
Sodium: 141 mmol/L (ref 135–145)

## 2019-09-27 LAB — BRAIN NATRIURETIC PEPTIDE: B Natriuretic Peptide: >4500 pg/mL — ABNORMAL HIGH (ref 0.0–100.0)

## 2019-09-27 LAB — TROPONIN I (HIGH SENSITIVITY): Troponin I (High Sensitivity): 68 ng/L — ABNORMAL HIGH (ref <18)

## 2019-09-27 MED ORDER — MORPHINE SULFATE (PF) 2 MG/ML IV SOLN
2.0000 mg | Freq: Once | INTRAVENOUS | Status: AC
  Administered 2019-09-27: 2 mg via INTRAVENOUS
  Filled 2019-09-27: qty 1

## 2019-09-27 MED ORDER — MORPHINE SULFATE (PF) 4 MG/ML IV SOLN
4.0000 mg | Freq: Once | INTRAVENOUS | Status: AC
  Administered 2019-09-27: 4 mg via INTRAVENOUS
  Filled 2019-09-27: qty 1

## 2019-09-27 MED ORDER — ONDANSETRON HCL 4 MG/2ML IJ SOLN
4.0000 mg | Freq: Once | INTRAMUSCULAR | Status: AC
  Administered 2019-09-27: 4 mg via INTRAVENOUS
  Filled 2019-09-27: qty 2

## 2019-09-27 MED ORDER — IOHEXOL 350 MG/ML SOLN
100.0000 mL | Freq: Once | INTRAVENOUS | Status: AC | PRN
  Administered 2019-09-27: 100 mL via INTRAVENOUS

## 2019-09-27 NOTE — ED Triage Notes (Signed)
Pt arrives via [facility] with c/o chest pain since dialysis earlier today at around 12:00 pm. Per [facility] pt took x2 81 mg aspirin at home and x2 nitro. EMS reports administering 50 mcg Fentanyl and x2 81 mg aspirin for a total of 324 mg. Pt is in NAD.

## 2019-09-27 NOTE — ED Notes (Signed)
RN to bedside to administer pain medication. Patient at CT

## 2019-09-27 NOTE — ED Notes (Signed)
ED Provider at bedside. 

## 2019-09-27 NOTE — ED Provider Notes (Signed)
[facility] Emergency Department Provider Note ____________________________________________   First MD Initiated Contact with Patient 09/27/19 2146     (approximate)  I have reviewed the triage vital signs and the nursing notes.   HISTORY  Chief Complaint Chest Pain    HPI LARK RUNK is a 64 y.o. male with PMH as noted below including hypertension, CHF, ESRD on dialysis, CAD, and PE (on Eliquis) who presents with chest pain, acute onset a few hours ago while the patient was getting dialysis.  He describes it as substernal and nonradiating.  It is achy in quality.  It is associated with some shortness of breath and nausea.  He states he has had chest pain like this somewhat before, but not as severe.  He states that the dialysis had to be cut short by about 1 hour.  He reports that he was feeling fine earlier today.  Past Medical History:  Diagnosis Date  . Depression   . Heart attack ([place])   . Hypertension   . PE (pulmonary embolism)   . Renal disorder     Patient Active Problem List   Diagnosis Date Noted  . Chronic midline low back pain   . Acute respiratory failure with hypoxia ([place])   . Acute diastolic CHF (congestive heart failure) ([place])   . Anemia of chronic disease   . Thrombocytopenia ([place])   . Congestive heart failure ([place])   . Fluid overload 06/21/2019  . Volume overload 11/15/2018  . ESRD (end stage renal disease) on dialysis ([place]) 11/14/2018  . HCAP (healthcare-associated pneumonia) 08/27/2018  . Dyspnea 08/25/2018  . Chest pain 08/20/2018  . ESRD on dialysis ([place]) 11/08/2016  . Prostate cancer screening 11/08/2016  . Acquired cyst of kidney 11/08/2016  . Urinary urgency 11/08/2016  . Acute on chronic renal failure ([place]) 09/23/2016  . Hypertensive urgency 09/21/2016  . Dysthymia 02/26/2016  . Chronic pain 02/26/2016  . Noncompliance with renal dialysis ([place]) 02/26/2016  . Opiate abuse, continuous ([place]) 02/03/2016  .  Substance induced mood disorder ([place]) 02/03/2016  . Antisocial personality disorder ([place]) 02/03/2016  . Left-sided weakness 01/18/2016  . HEPATITIS C 06/28/2008  . HLD (hyperlipidemia) 06/28/2008  . SUBSTANCE ABUSE, MULTIPLE 06/28/2008  . Hypertension 06/27/2008    Past Surgical History:  Procedure Laterality Date  . TOTAL HIP ARTHROPLASTY      Prior to Admission medications   Medication Sig Start Date End Date Taking? Authorizing Provider  albuterol (VENTOLIN HFA) 108 (90 Base) MCG/ACT inhaler Inhale 2 puffs into the lungs every 6 (six) hours as needed for wheezing or shortness of breath. 06/23/19   Jennye Boroughs, MD  amLODipine (NORVASC) 10 MG tablet Take 1 tablet (10 mg total) by mouth daily. 06/23/19 06/22/20  Jennye Boroughs, MD  apixaban (ELIQUIS) 2.5 MG TABS tablet Take 1 tablet (2.5 mg total) by mouth 2 (two) times daily. 06/23/19   Jennye Boroughs, MD  atorvastatin (LIPITOR) 40 MG tablet Take 1 tablet (40 mg total) by mouth at bedtime. 06/23/19 06/22/20  Jennye Boroughs, MD  AURYXIA 1 GM 210 MG(Fe) tablet Take 210 mg by mouth See admin instructions. Take 1 tablet (210mg ) by mouth three times daily with meals and take 1 tablet (210mg ) by mouth with snacks 06/28/19   [provider]  hydrALAZINE (APRESOLINE) 100 MG tablet Take 1 tablet (100 mg total) by mouth 3 (three) times daily. 06/23/19   Jennye Boroughs, MD  irbesartan (AVAPRO) 75 MG tablet Take 1 tablet (75 mg total) by  mouth at bedtime. 06/23/19   Jennye Boroughs, MD  isosorbide mononitrate (IMDUR) 60 MG 24 hr tablet Take 1 tablet (60 mg total) by mouth daily. 06/23/19   Jennye Boroughs, MD  ondansetron (ZOFRAN) 4 MG tablet Take 1 tablet (4 mg total) by mouth every 6 (six) hours as needed for nausea. 07/08/19   Loletha Grayer, MD  polyethylene glycol (MIRALAX / GLYCOLAX) 17 g packet Take 17 g by mouth daily as needed for mild constipation. 07/08/19   Loletha Grayer, MD  sevelamer carbonate (RENVELA) 800 MG tablet Take  1 tablet (800 mg total) by mouth 3 (three) times daily with meals. 06/23/19   Jennye Boroughs, MD  tamsulosin (FLOMAX) 0.4 MG CAPS capsule Take 1 capsule (0.4 mg total) by mouth daily. 09/27/16   Gladstone Lighter, MD    Allergies Cyclobenzaprine, Clonidine, Furosemide, and Tylenol [acetaminophen]  No family history on file.  Social History Social History   Tobacco Use  . Smoking status: Current Every Day Smoker    Packs/day: 0.50    Types: Cigarettes  . Smokeless tobacco: Never Used  Substance Use Topics  . Alcohol use: No  . Drug use: No    Comment: pt has been positive for cocaine twice this week but denies use    Review of Systems  Constitutional: No fever/chills. Eyes: No redness. ENT: No sore throat. Cardiovascular: Positive for chest pain. Respiratory: Denies shortness of breath. Gastrointestinal: No vomiting or diarrhea.  Genitourinary: Negative for flank pain. Musculoskeletal: Negative for back pain. Skin: Negative for rash. Neurological: Negative for headache.   ____________________________________________   PHYSICAL EXAM:  VITAL SIGNS: ED Triage Vitals  Enc Vitals Group     BP 09/27/19 2145 (!) 153/87     Pulse Rate 09/27/19 2145 70     Resp 09/27/19 2145 (!) 25     Temp 09/27/19 2145 (!) 97.5 F (36.4 C)     Temp Source 09/27/19 2145 Oral     SpO2 09/27/19 2145 99 %     Weight 09/27/19 2143 240 lb (108.9 kg)     Height 09/27/19 2143 6' (1.829 m)     Head Circumference --      Peak Flow --      Pain Score 09/27/19 2143 8     Pain Loc --      Pain Edu? --      Excl. in [place]? --     Constitutional: Alert and oriented.  Uncomfortable appearing but in no acute distress. Eyes: Conjunctivae are normal.  Head: Atraumatic. Nose: No congestion/rhinnorhea. Mouth/Throat: Mucous membranes are moist.   Neck: Normal range of motion.  Cardiovascular: Normal rate, regular rhythm. Grossly normal heart sounds.  Good peripheral circulation. Respiratory:  Normal respiratory effort.  No retractions. Lungs CTAB. Gastrointestinal: Soft and nontender. No distention.  Genitourinary: No flank tenderness. Musculoskeletal: No significant lower extremity edema.  Extremities warm and well perfused.  Neurologic:  Normal speech and language. No gross focal neurologic deficits are appreciated.  Skin:  Skin is warm and dry. No rash noted. Psychiatric: Mood and affect are normal. Speech and behavior are normal.  ____________________________________________   LABS (all labs ordered are listed, but only abnormal results are displayed)  Labs Reviewed  BASIC METABOLIC PANEL - Abnormal; Notable for the following components:      Result Value   Glucose, Bld 121 (*)    BUN 29 (*)    Creatinine, Ser 5.90 (*)    Calcium 8.1 (*)    GFR calc non  Af Amer 9 (*)    GFR calc Af Amer 11 (*)    All other components within normal limits  CBC WITH DIFFERENTIAL/PLATELET - Abnormal; Notable for the following components:   RBC 2.94 (*)    Hemoglobin 8.9 (*)    HCT 26.7 (*)    Platelets 120 (*)    All other components within normal limits  BRAIN NATRIURETIC PEPTIDE - Abnormal; Notable for the following components:   B Natriuretic Peptide >4,500.0 (*)    All other components within normal limits  TROPONIN I (HIGH SENSITIVITY) - Abnormal; Notable for the following components:   Troponin I (High Sensitivity) 68 (*)    All other components within normal limits   ____________________________________________  EKG ED ECG REPORT I, Arta Silence, the attending physician, personally viewed and interpreted this ECG.  Date: 09/27/2019 EKG Time: 2154 Rate: 68 Rhythm: normal sinus rhythm QRS Axis: normal Intervals: Prolonged QTc ST/T Wave abnormalities: LVH with repolarization abnormality Narrative Interpretation: Nonspecific abnormalities with no evidence of acute ischemia; no significant change when compared to EKG of  07/13/19  ____________________________________________  RADIOLOGY  CXR: Vascular congestion with no acute findings  ____________________________________________   PROCEDURES  Procedure(s) performed: No  Procedures  Critical Care performed: No ____________________________________________   INITIAL IMPRESSION / ASSESSMENT AND PLAN / ED COURSE  Pertinent labs & imaging results that were available during my care of the patient were reviewed by me and considered in my medical decision making (see chart for details).  64 year old male with PMH as noted above including ESRD on dialysis presents from his dialysis with substernal chest pain.  He received fentanyl, nitro, and aspirin prior to arrival with slight relief but is still having active pain.  I reviewed the past medical records in [place].  Patient was most recently admitted in December 2020 with volume overload after missing dialysis, with acute hypoxic respiratory failure.  Subsequently he was seen in the ED on 07/14/2019 with chest pain after missing dialysis.  At that time it was determined to be an exacerbation of chronic pain after running out of his pain medication.  On exam today, the patient is uncomfortable but not acutely ill-appearing.  His vital signs are normal except for slight hypertension.  The pain is somewhat reproducible to palpation.  The physical exam is otherwise unremarkable.  EKG shows no acute ischemic findings.  Overall I have a low suspicion for ACS although the patient is at elevated risk.  He also has a history of PE.  Given the lack of tachycardia or hypoxia I do not suspect PE on this presentation.  There are also no findings to suggest aortic dissection or other vascular etiology.  We will obtain a chest x-ray, lab work-up including troponins x2.  Based on the results of this work-up we will determine whether the patient requires further observation or will be safe to go  home.  ----------------------------------------- 11:12 PM on 09/27/2019 -----------------------------------------  Patient reports improved pain after some morphine and appears well.  He states that his PE was about a year ago.  He states that he has not really been compliant with his Eliquis since it causes him to bleed too much with the dialysis.  Therefore I will obtain a CT to rule out PE.  If this is negative, the plan will be to repeat the troponin.  I have signed the patient out to the oncoming physician Dr. Jari Pigg.  ____________________________________________   FINAL CLINICAL IMPRESSION(S) / ED DIAGNOSES  Final diagnoses:  Chest pain, unspecified type      NEW MEDICATIONS STARTED DURING THIS VISIT:  New Prescriptions   No medications on file     Note:  This document was prepared using Dragon voice recognition software and may include unintentional dictation errors.    Arta Silence, MD 09/27/19 [phone]

## 2019-09-28 ENCOUNTER — Encounter: Payer: Self-pay | Admitting: Internal Medicine

## 2019-09-28 ENCOUNTER — Other Ambulatory Visit: Payer: Self-pay

## 2019-09-28 DIAGNOSIS — Z9114 Patient's other noncompliance with medication regimen: Secondary | ICD-10-CM | POA: Diagnosis not present

## 2019-09-28 DIAGNOSIS — E785 Hyperlipidemia, unspecified: Secondary | ICD-10-CM | POA: Diagnosis present

## 2019-09-28 DIAGNOSIS — I132 Hypertensive heart and chronic kidney disease with heart failure and with stage 5 chronic kidney disease, or end stage renal disease: Secondary | ICD-10-CM | POA: Diagnosis present

## 2019-09-28 DIAGNOSIS — I10 Essential (primary) hypertension: Secondary | ICD-10-CM | POA: Diagnosis not present

## 2019-09-28 DIAGNOSIS — Z8701 Personal history of pneumonia (recurrent): Secondary | ICD-10-CM | POA: Diagnosis not present

## 2019-09-28 DIAGNOSIS — I259 Chronic ischemic heart disease, unspecified: Secondary | ICD-10-CM | POA: Diagnosis not present

## 2019-09-28 DIAGNOSIS — I208 Other forms of angina pectoris: Secondary | ICD-10-CM

## 2019-09-28 DIAGNOSIS — I5032 Chronic diastolic (congestive) heart failure: Secondary | ICD-10-CM

## 2019-09-28 DIAGNOSIS — Z7901 Long term (current) use of anticoagulants: Secondary | ICD-10-CM | POA: Diagnosis not present

## 2019-09-28 DIAGNOSIS — N185 Chronic kidney disease, stage 5: Secondary | ICD-10-CM

## 2019-09-28 DIAGNOSIS — R0789 Other chest pain: Secondary | ICD-10-CM | POA: Diagnosis present

## 2019-09-28 DIAGNOSIS — R072 Precordial pain: Secondary | ICD-10-CM

## 2019-09-28 DIAGNOSIS — N186 End stage renal disease: Secondary | ICD-10-CM

## 2019-09-28 DIAGNOSIS — F17210 Nicotine dependence, cigarettes, uncomplicated: Secondary | ICD-10-CM | POA: Diagnosis present

## 2019-09-28 DIAGNOSIS — Z886 Allergy status to analgesic agent status: Secondary | ICD-10-CM | POA: Diagnosis not present

## 2019-09-28 DIAGNOSIS — Z992 Dependence on renal dialysis: Secondary | ICD-10-CM | POA: Diagnosis not present

## 2019-09-28 DIAGNOSIS — Z881 Allergy status to other antibiotic agents status: Secondary | ICD-10-CM | POA: Diagnosis not present

## 2019-09-28 DIAGNOSIS — Z8249 Family history of ischemic heart disease and other diseases of the circulatory system: Secondary | ICD-10-CM | POA: Diagnosis not present

## 2019-09-28 DIAGNOSIS — R079 Chest pain, unspecified: Secondary | ICD-10-CM

## 2019-09-28 DIAGNOSIS — I25110 Atherosclerotic heart disease of native coronary artery with unstable angina pectoris: Secondary | ICD-10-CM | POA: Diagnosis not present

## 2019-09-28 DIAGNOSIS — Z86711 Personal history of pulmonary embolism: Secondary | ICD-10-CM

## 2019-09-28 DIAGNOSIS — J438 Other emphysema: Secondary | ICD-10-CM | POA: Diagnosis present

## 2019-09-28 DIAGNOSIS — Z79899 Other long term (current) drug therapy: Secondary | ICD-10-CM | POA: Diagnosis not present

## 2019-09-28 DIAGNOSIS — Z20822 Contact with and (suspected) exposure to covid-19: Secondary | ICD-10-CM | POA: Diagnosis present

## 2019-09-28 DIAGNOSIS — D631 Anemia in chronic kidney disease: Secondary | ICD-10-CM | POA: Diagnosis present

## 2019-09-28 DIAGNOSIS — I700 Atherosclerosis of aorta: Secondary | ICD-10-CM | POA: Diagnosis present

## 2019-09-28 DIAGNOSIS — Z888 Allergy status to other drugs, medicaments and biological substances status: Secondary | ICD-10-CM | POA: Diagnosis not present

## 2019-09-28 DIAGNOSIS — J432 Centrilobular emphysema: Secondary | ICD-10-CM | POA: Diagnosis present

## 2019-09-28 DIAGNOSIS — I252 Old myocardial infarction: Secondary | ICD-10-CM | POA: Diagnosis not present

## 2019-09-28 DIAGNOSIS — I2510 Atherosclerotic heart disease of native coronary artery without angina pectoris: Secondary | ICD-10-CM | POA: Diagnosis present

## 2019-09-28 DIAGNOSIS — G8929 Other chronic pain: Secondary | ICD-10-CM | POA: Diagnosis present

## 2019-09-28 LAB — HIV ANTIBODY (ROUTINE TESTING W REFLEX): HIV Screen 4th Generation wRfx: NONREACTIVE

## 2019-09-28 LAB — SARS CORONAVIRUS 2 (TAT 6-24 HRS): SARS Coronavirus 2: NEGATIVE

## 2019-09-28 LAB — LIPID PANEL
Cholesterol: 135 mg/dL (ref 0–200)
HDL: 48 mg/dL (ref >40)
LDL Cholesterol: 78 mg/dL (ref 0–99)
Total CHOL/HDL Ratio: 2.8 RATIO
Triglycerides: 46 mg/dL (ref <150)
VLDL: 9 mg/dL (ref 0–40)

## 2019-09-28 LAB — MRSA PCR SCREENING: MRSA by PCR: NEGATIVE

## 2019-09-28 LAB — TROPONIN I (HIGH SENSITIVITY): Troponin I (High Sensitivity): 62 ng/L — ABNORMAL HIGH (ref <18)

## 2019-09-28 LAB — TSH: TSH: 0.947 u[IU]/mL (ref 0.350–4.500)

## 2019-09-28 LAB — MAGNESIUM: Magnesium: 2.1 mg/dL (ref 1.7–2.4)

## 2019-09-28 MED ORDER — ATORVASTATIN CALCIUM 20 MG PO TABS
40.0000 mg | ORAL_TABLET | Freq: Every day | ORAL | Status: DC
  Administered 2019-09-28 – 2019-09-30 (×3): 40 mg via ORAL
  Filled 2019-09-28 (×3): qty 2

## 2019-09-28 MED ORDER — ONDANSETRON HCL 4 MG/2ML IJ SOLN
4.0000 mg | Freq: Four times a day (QID) | INTRAMUSCULAR | Status: DC | PRN
  Administered 2019-09-29 (×2): 4 mg via INTRAVENOUS
  Filled 2019-09-28 (×2): qty 2

## 2019-09-28 MED ORDER — ISOSORBIDE MONONITRATE ER 60 MG PO TB24
60.0000 mg | ORAL_TABLET | Freq: Every day | ORAL | Status: DC
  Administered 2019-09-28 – 2019-09-30 (×3): 60 mg via ORAL
  Filled 2019-09-28 (×3): qty 1

## 2019-09-28 MED ORDER — TORSEMIDE 20 MG PO TABS
100.0000 mg | ORAL_TABLET | Freq: Every day | ORAL | Status: DC
  Administered 2019-09-28 – 2019-09-30 (×3): 100 mg via ORAL
  Filled 2019-09-28 (×3): qty 5

## 2019-09-28 MED ORDER — POTASSIUM CHLORIDE CRYS ER 20 MEQ PO TBCR
40.0000 meq | EXTENDED_RELEASE_TABLET | Freq: Once | ORAL | Status: AC
  Administered 2019-09-28: 40 meq via ORAL
  Filled 2019-09-28: qty 2

## 2019-09-28 MED ORDER — HYDRALAZINE HCL 50 MG PO TABS
100.0000 mg | ORAL_TABLET | Freq: Three times a day (TID) | ORAL | Status: DC
  Administered 2019-09-28 – 2019-09-30 (×7): 100 mg via ORAL
  Filled 2019-09-28 (×8): qty 2

## 2019-09-28 MED ORDER — ASPIRIN EC 81 MG PO TBEC
81.0000 mg | DELAYED_RELEASE_TABLET | Freq: Every day | ORAL | Status: DC
  Administered 2019-09-29: 81 mg via ORAL
  Filled 2019-09-28: qty 1

## 2019-09-28 MED ORDER — ASPIRIN 300 MG RE SUPP: 300.0000 mg | RECTAL | Status: AC

## 2019-09-28 MED ORDER — SODIUM CHLORIDE 0.9% FLUSH
3.0000 mL | Freq: Two times a day (BID) | INTRAVENOUS | Status: DC
  Administered 2019-09-28 – 2019-09-29 (×3): 3 mL via INTRAVENOUS

## 2019-09-28 MED ORDER — MORPHINE SULFATE (PF) 2 MG/ML IV SOLN
2.0000 mg | INTRAVENOUS | Status: DC | PRN
  Administered 2019-09-28 – 2019-09-30 (×5): 2 mg via INTRAVENOUS
  Filled 2019-09-28 (×6): qty 1

## 2019-09-28 MED ORDER — HEPARIN SODIUM (PORCINE) 5000 UNIT/ML IJ SOLN
5000.0000 [IU] | Freq: Three times a day (TID) | INTRAMUSCULAR | Status: DC
  Administered 2019-09-28 – 2019-09-30 (×7): 5000 [IU] via SUBCUTANEOUS
  Filled 2019-09-28 (×7): qty 1

## 2019-09-28 MED ORDER — OXYCODONE HCL 5 MG PO TABS
5.0000 mg | ORAL_TABLET | Freq: Four times a day (QID) | ORAL | Status: DC | PRN
  Administered 2019-09-28 (×2): 10 mg via ORAL
  Administered 2019-09-28: 5 mg via ORAL
  Administered 2019-09-28 – 2019-09-29 (×2): 10 mg via ORAL
  Administered 2019-09-29: 5 mg via ORAL
  Administered 2019-09-29 – 2019-09-30 (×3): 10 mg via ORAL
  Filled 2019-09-28: qty 2
  Filled 2019-09-28: qty 1
  Filled 2019-09-28 (×5): qty 2
  Filled 2019-09-28: qty 1
  Filled 2019-09-28: qty 2

## 2019-09-28 MED ORDER — AMLODIPINE BESYLATE 10 MG PO TABS
10.0000 mg | ORAL_TABLET | Freq: Every day | ORAL | Status: DC
  Administered 2019-09-28 – 2019-09-30 (×3): 10 mg via ORAL
  Filled 2019-09-28 (×4): qty 1

## 2019-09-28 MED ORDER — NITROGLYCERIN 0.4 MG SL SUBL: 0.4000 mg | SUBLINGUAL_TABLET | SUBLINGUAL | Status: DC | PRN

## 2019-09-28 MED ORDER — HYDROMORPHONE HCL 1 MG/ML IJ SOLN
0.5000 mg | Freq: Once | INTRAMUSCULAR | Status: AC
  Administered 2019-09-28: 0.5 mg via INTRAVENOUS
  Filled 2019-09-28: qty 1

## 2019-09-28 MED ORDER — IRBESARTAN 150 MG PO TABS
75.0000 mg | ORAL_TABLET | Freq: Every day | ORAL | Status: DC
  Administered 2019-09-28 – 2019-09-29 (×2): 75 mg via ORAL
  Filled 2019-09-28 (×2): qty 1

## 2019-09-28 MED ORDER — ASPIRIN 81 MG PO CHEW: 324.0000 mg | CHEWABLE_TABLET | ORAL | Status: AC

## 2019-09-28 MED ORDER — OXYCODONE HCL ER 10 MG PO T12A
20.0000 mg | EXTENDED_RELEASE_TABLET | Freq: Two times a day (BID) | ORAL | Status: DC
  Administered 2019-09-28 (×3): 20 mg via ORAL
  Administered 2019-09-29: 10 mg via ORAL
  Administered 2019-09-29 – 2019-09-30 (×2): 20 mg via ORAL
  Filled 2019-09-28 (×6): qty 2

## 2019-09-28 NOTE — H&P (Signed)
History and Physical    [name] JOI:[id number] DOB: Sep 12, 1955 DOA: 09/27/2019  PCP: Patient, No Pcp Per   Patient coming from: home  I have personally briefly reviewed patient's old medical records in [place]  Chief Complaint: chest pain  HPI: [name] is a 64 y.o. male with medical history significant for ESRD on HD MWF, CAD with history of MI but no PCI, history of PE, partially compliant with apixaban, diastolic heart failure, anemia of chronic disease and chronic pain on chronic opoids who presents to the emergency room after he developed chest pain during dialysis causing the session to be cut short by 1 hour.  The pain is retrosternal and heavy in quality, nonradiating and of moderate intensity.  No aggravating or alleviating factors.  Has no associated nausea, vomiting, palpitations, diaphoresis or lightheadedness.  Patient was hospitalized a year ago with chest pain and was evaluated by cardiology.  The pain was found to be atypical and no further work-up was recommended.  Patient does have documented history of substance abuse.  ED Course: On arrival in the emergency room, vitals were within normal limits.  First troponin 68 trending down to 62.  EKG with no acute ST-T wave changes.  Hemoglobin 8.9 down from a baseline of 10.8.  He had a CTA of his chest that was negative for lateral central PE.  Did show triple-vessel CAD.  Patient continued to have pain in the emergency room and observation was requested.  Review of Systems: As per HPI otherwise 10 point review of systems negative.    Past Medical History:  Diagnosis Date  . Depression   . Heart attack ([place])   . Hypertension   . PE (pulmonary embolism)   . Renal disorder     Past Surgical History:  Procedure Laterality Date  . TOTAL HIP ARTHROPLASTY       reports that he has been smoking cigarettes. He has been smoking about 0.50 packs per day. He has never used smokeless tobacco. He reports  that he does not drink alcohol or use drugs.  Allergies  Allergen Reactions  . Cyclobenzaprine Nausea Only  . Clonidine Other (See Comments)    Asymptomatic bradycardia to 38  . Furosemide Other (See Comments)    Caused renal failure  . Tylenol [Acetaminophen] Other (See Comments)    Reaction:  Pt states it bothers his liver    No family history on file.   Prior to Admission medications   Medication Sig Start Date End Date Taking? Authorizing Provider  albuterol (VENTOLIN HFA) 108 (90 Base) MCG/ACT inhaler Inhale 2 puffs into the lungs every 6 (six) hours as needed for wheezing or shortness of breath. 06/23/19   Jennye Boroughs, MD  amLODipine (NORVASC) 10 MG tablet Take 1 tablet (10 mg total) by mouth daily. 06/23/19 06/22/20  Jennye Boroughs, MD  apixaban (ELIQUIS) 2.5 MG TABS tablet Take 1 tablet (2.5 mg total) by mouth 2 (two) times daily. 06/23/19   Jennye Boroughs, MD  atorvastatin (LIPITOR) 40 MG tablet Take 1 tablet (40 mg total) by mouth at bedtime. 06/23/19 06/22/20  Jennye Boroughs, MD  AURYXIA 1 GM 210 MG(Fe) tablet Take 210 mg by mouth See admin instructions. Take 1 tablet (210mg ) by mouth three times daily with meals and take 1 tablet (210mg ) by mouth with snacks 06/28/19   [provider]  hydrALAZINE (APRESOLINE) 100 MG tablet Take 1 tablet (100 mg total) by mouth 3 (three) times daily. 06/23/19  Jennye Boroughs, MD  irbesartan (AVAPRO) 75 MG tablet Take 1 tablet (75 mg total) by mouth at bedtime. 06/23/19   Jennye Boroughs, MD  isosorbide mononitrate (IMDUR) 60 MG 24 hr tablet Take 1 tablet (60 mg total) by mouth daily. 06/23/19   Jennye Boroughs, MD  ondansetron (ZOFRAN) 4 MG tablet Take 1 tablet (4 mg total) by mouth every 6 (six) hours as needed for nausea. 07/08/19   Loletha Grayer, MD  polyethylene glycol (MIRALAX / GLYCOLAX) 17 g packet Take 17 g by mouth daily as needed for mild constipation. 07/08/19   Loletha Grayer, MD  sevelamer carbonate (RENVELA) 800  MG tablet Take 1 tablet (800 mg total) by mouth 3 (three) times daily with meals. 06/23/19   Jennye Boroughs, MD  tamsulosin (FLOMAX) 0.4 MG CAPS capsule Take 1 capsule (0.4 mg total) by mouth daily. 09/27/16   Gladstone Lighter, MD    Physical Exam: Vitals:   09/27/19 2300 09/28/19 0000 09/28/19 0015 09/28/19 0030  BP: (!) 161/91     Pulse: 69 83 71 73  Resp: (!) 22 (!) 22 (!) 24 (!) 22  Temp:      TempSrc:      SpO2: 98% 100% 95% 96%  Weight:      Height:         Vitals:   09/27/19 2300 09/28/19 0000 09/28/19 0015 09/28/19 0030  BP: (!) 161/91     Pulse: 69 83 71 73  Resp: (!) 22 (!) 22 (!) 24 (!) 22  Temp:      TempSrc:      SpO2: 98% 100% 95% 96%  Weight:      Height:        Constitutional: Alert and awake, oriented x3, not in any acute distress. Eyes: PERLA, EOMI, irises appear normal, anicteric sclera,  ENMT: external ears and nose appear normal, normal hearing             Lips appears normal, oropharynx mucosa, tongue, posterior pharynx appear normal  Neck: neck appears normal, no masses, normal ROM, no thyromegaly, no JVD  CVS: S1-S2 clear, no murmur rubs or gallops,  , no carotid bruits, pedal pulses palpable, No LE edema Respiratory:  clear to auscultation bilaterally, no wheezing, rales or rhonchi. Respiratory effort normal. No accessory muscle use.  Abdomen: soft nontender, nondistended, normal bowel sounds, no hepatosplenomegaly, no hernias Musculoskeletal: : no cyanosis, clubbing , no contractures or atrophy Neuro: Cranial nerves II-XII intact, sensation, reflexes normal, strength Psych: judgement and insight appear normal, stable mood and affect,  Skin: no rashes or lesions or ulcers, no induration or nodules   Labs on Admission: I have personally reviewed following labs and imaging studies  CBC: Recent Labs  Lab 09/27/19 2150  WBC 4.9  NEUTROABS 3.1  HGB 8.9*  HCT 26.7*  MCV 90.8  PLT 902*   Basic Metabolic Panel: Recent Labs  Lab  09/27/19 2150  NA 141  K 3.6  CL 102  CO2 31  GLUCOSE 121*  BUN 29*  CREATININE 5.90*  CALCIUM 8.1*   GFR: Estimated Creatinine Clearance: 16.8 mL/min (A) (by C-G formula based on SCr of 5.9 mg/dL (H)). Liver Function Tests: No results for input(s): AST, ALT, ALKPHOS, BILITOT, PROT, ALBUMIN in the last 168 hours. No results for input(s): LIPASE, AMYLASE in the last 168 hours. No results for input(s): AMMONIA in the last 168 hours. Coagulation Profile: No results for input(s): INR, PROTIME in the last 168 hours. Cardiac Enzymes: No results for  input(s): CKTOTAL, CKMB, CKMBINDEX, TROPONINI in the last 168 hours. BNP (last 3 results) No results for input(s): PROBNP in the last 8760 hours. HbA1C: No results for input(s): HGBA1C in the last 72 hours. CBG: No results for input(s): GLUCAP in the last 168 hours. Lipid Profile: No results for input(s): CHOL, HDL, LDLCALC, TRIG, CHOLHDL, LDLDIRECT in the last 72 hours. Thyroid Function Tests: No results for input(s): TSH, T4TOTAL, FREET4, T3FREE, THYROIDAB in the last 72 hours. Anemia Panel: No results for input(s): VITAMINB12, FOLATE, FERRITIN, TIBC, IRON, RETICCTPCT in the last 72 hours. Urine analysis:    Component Value Date/Time   COLORURINE YELLOW (A) 08/20/2018 0230   APPEARANCEUR CLOUDY (A) 08/20/2018 0230   APPEARANCEUR Clear 11/08/2014 1559   LABSPEC 1.010 08/20/2018 0230   LABSPEC 1.010 11/08/2014 1559   PHURINE 9.0 (H) 08/20/2018 0230   GLUCOSEU NEGATIVE 08/20/2018 0230   GLUCOSEU Negative 11/08/2014 1559   HGBUR NEGATIVE 08/20/2018 0230   BILIRUBINUR NEGATIVE 08/20/2018 0230   BILIRUBINUR Negative 11/08/2014 1559   KETONESUR NEGATIVE 08/20/2018 0230   PROTEINUR 100 (A) 08/20/2018 0230   UROBILINOGEN 1.0 10/28/2010 1100   NITRITE POSITIVE (A) 08/20/2018 0230   LEUKOCYTESUR LARGE (A) 08/20/2018 0230   LEUKOCYTESUR Negative 11/08/2014 1559    Radiological Exams on Admission: CT Angio Chest PE W and/or Wo  Contrast  Result Date: 09/28/2019 CLINICAL DATA:  Chest pain since dialysis EXAM: CT ANGIOGRAPHY CHEST WITH CONTRAST TECHNIQUE: Multidetector CT imaging of the chest was performed using the standard protocol during bolus administration of intravenous contrast. Multiplanar CT image reconstructions and MIPs were obtained to evaluate the vascular anatomy. CONTRAST:  165mL OMNIPAQUE IOHEXOL 350 MG/ML SOLN COMPARISON:  Radiograph 09/27/2019 FINDINGS: Cardiovascular: Satisfactory opacification the pulmonary arteries. Respiratory motion and cardiac pulsation artifacts limit evaluation beyond the lobar levels. No large central or lobar pulmonary artery emboli are seen. Central pulmonary arteries are upper limits normal caliber. There is mild cardiomegaly. Three-vessel coronary artery disease is seen. No pericardial effusion. Calcifications present on the aortic leaflets. Atherosclerotic plaque within the normal caliber aorta. No periaortic stranding, hemorrhage or discernible acute luminal abnormality. Normal 3 vessel branching of the aortic arch. Proximal great vessels are mildly calcified but otherwise unremarkable. Mediastinum/Nodes: Few borderline enlarged low-attenuation mediastinal and hilar nodes are present for instance a 13 mm subcarinal node (4/47), a 14 mm precarinal lymph node (4/39), and a 12 mm right hilar node (4/45). No acute abnormality of the trachea or esophagus. Thyroid gland and thoracic inlet are unremarkable. Lungs/Pleura: Centrilobular and paraseptal emphysema with an apical predominance. Small pulmonary cyst noted in the left costophrenic sulcus, similar to prior. Apical predominant interlobular septal thickening with peribronchovascular cuffing. Atelectatic changes in the lung bases, likely accentuated by imaging during exhalation for the angiographic technique. Upper Abdomen: No acute abnormalities present in the visualized portions of the upper abdomen. Atherosclerotic calcification of the  abdominal aorta and branch vessels is noted. Mild lobular thickening of the left adrenal gland is nonspecific. No visible discrete adrenal masses. Musculoskeletal: Minimal discogenic and facet degenerative change in the thoracic spine. Mild cortical thickening and increased attenuation of the right sixth rib, could reflect early pagetoid involvement. No other acute or suspicious osseous lesions. Review of the MIP images confirms the above findings. IMPRESSION: 1. No large central or lobar pulmonary artery emboli. Respiratory motion and cardiac pulsation artifacts limit evaluation beyond the lobar levels. 2. Centrilobular and paraseptal emphysema with an apical predominance. Emphysema (ICD10-J43.9). 3. Septal thickening with peribronchovascular cuffing may reflect a component  of mild interstitial edema. 4. Few borderline enlarged low-attenuation mediastinal and hilar nodes are likely reactive. 5. Mild cortical thickening and increased attenuation of the right sixth rib, could reflect early pagetoid involvement. No other acute or suspicious osseous lesions. 6. Three-vessel coronary artery disease. 7.  Aortic Atherosclerosis (ICD10-I70.0). Electronically Signed   By: Lovena Le M.D.   On: 09/28/2019 00:07   DG Chest Portable 1 View  Result Date: 09/27/2019 CLINICAL DATA:  Chest pain EXAM: PORTABLE CHEST 1 VIEW COMPARISON:  07/13/2019 FINDINGS: Cardiomegaly with vascular congestion. No confluent opacities or effusions. No acute bony abnormality. IMPRESSION: Cardiomegaly, vascular congestion. Electronically Signed   By: Rolm Baptise M.D.   On: 09/27/2019 22:19    EKG: Independently reviewed.   Assessment/Plan Principal Problem:   Chest pain   History of MI (myocardial infarction) -Patient presenting with chest pain but with flat troponin trend 68-62 and no acute EKG changes -Had a CTA of his chest that did show triple-vessel disease -Cardiology consult for further  recommendations -Echocardiogram -Continue apixaban, atorvastatin, Imdur. -Sublingual nitroglycerin as needed chest pain with morphine for breakthrough    History of pulmonary embolism -Admits to partial compliance with apixaban because he said it makes him bleed a lot when he goes to dialysis -CTA PE protocol showed no lateral central PE    Hypertension -Continue home amlodipine and irbesartan and hydralazine    ESRD (end stage renal disease) on dialysis [facility]) -Dialysis session was cut short by 1 hour due to the chest pain -Nephrology consult for continuation of dialysis    Chronic diastolic CHF (congestive heart failure) ([place]) -Appears euvolemic -Not currently on beta-blocker, possibly because of documented history of substance abuse -Continue irbesartan  Chronic pain -Continue OxyContin and oxycodone IR    Anemia of chronic kidney failure, stage 5 (HCC) -Hemoglobin 8.9, down from baseline 10.8 -Continue to monitor    DVT prophylaxis: Lovenox (noncompliant with apixaban) Code Status: full code  Family Communication:  none  Disposition Plan: Back to previous home environment Consults called: nephrology and cardiology Status:obs    Athena Masse MD [facility]     09/28/2019, 1:24 AM

## 2019-09-28 NOTE — Progress Notes (Signed)
[facility], [place] 09/28/19  Subjective:   LOS: 0 No intake/output data recorded.  Last HD was yesterday Doing fair at present Denies any acute complaints No nausea or vomiting Presented to the emergency room for chest pain and high heart rate postdialysis   Objective:  Vital signs in last 24 hours:  Temp:  [97.5 F (36.4 C)] 97.5 F (36.4 C) (03/20 0449) Pulse Rate:  [69-83] 74 (03/20 0817) Resp:  [16-25] 18 (03/20 0817) BP: (137-166)/(87-96) 144/94 (03/20 0817) SpO2:  [88 %-100 %] 100 % (03/20 0817) Weight:  [108.3 kg-108.9 kg] 108.3 kg (03/20 0457)  Weight change:  Filed Weights   09/27/19 2143 09/28/19 0457  Weight: 108.9 kg 108.3 kg    Intake/Output:   No intake or output data in the 24 hours ending 09/28/19 1201   Physical Exam: General:  Obese gentleman, laying in the bed  HEENT  anicteric, moist oral mucous member  Pulm/lungs  normal breathing effort, clear to auscultation  CVS/Heart  regular rhythm  Abdomen:   Soft, nontender  Extremities:  No peripheral edema  Neurologic:  Alert, oriented  Skin:  No acute rashes          Basic Metabolic Panel:  Recent Labs  Lab 09/27/19 2150 09/28/19 0018  NA 141  --   K 3.6  --   CL 102  --   CO2 31  --   GLUCOSE 121*  --   BUN 29*  --   CREATININE 5.90*  --   CALCIUM 8.1*  --   MG  --  2.1     CBC: Recent Labs  Lab 09/27/19 2150  WBC 4.9  NEUTROABS 3.1  HGB 8.9*  HCT 26.7*  MCV 90.8  PLT 120*      Lab Results  Component Value Date   HEPBSAG NON REACTIVE 06/21/2019   HEPBSAB Non Reactive 11/15/2018   HEPBIGM Negative 11/15/2018      Microbiology:  Recent Results (from the past 240 hour(s))  SARS CORONAVIRUS 2 (TAT 6-24 HRS) Nasopharyngeal Nasopharyngeal Swab     Status: None   Collection Time: 09/28/19  1:42 AM   Specimen: Nasopharyngeal Swab  Result Value Ref Range Status   SARS Coronavirus 2 NEGATIVE NEGATIVE Final    Comment:  (NOTE) SARS-CoV-2 target nucleic acids are NOT DETECTED. The SARS-CoV-2 RNA is generally detectable in upper and lower respiratory specimens during the acute phase of infection. Negative results do not preclude SARS-CoV-2 infection, do not rule out co-infections with other pathogens, and should not be used as the sole basis for treatment or other patient management decisions. Negative results must be combined with clinical observations, patient history, and epidemiological information. The expected result is Negative. Fact Sheet for Patients: SugarRoll.be Fact Sheet for Healthcare Providers: https://www.woods-mathews.com/ This test is not yet approved or cleared by the [place] FDA and  has been authorized for detection and/or diagnosis of SARS-CoV-2 by FDA under an Emergency Use Authorization (EUA). This EUA will remain  in effect (meaning this test can be used) for the duration of the COVID-19 declaration under Section 56 4(b)(1) of the Act, 21 U.S.C. section 360bbb-3(b)(1), unless the authorization is terminated or revoked sooner. Performed at [facility], [place] [address], [place], [place] [address]   MRSA PCR Screening     Status: None   Collection Time: 09/28/19  4:47 AM   Specimen: Nasal Mucosa; Nasopharyngeal  Result Value Ref Range Status   MRSA by PCR NEGATIVE  NEGATIVE Final    Comment:        The GeneXpert MRSA Assay (FDA approved for NASAL specimens only), is one component of a comprehensive MRSA colonization surveillance program. It is not intended to diagnose MRSA infection nor to guide or monitor treatment for MRSA infections. Performed at [facility], [address] [place], [place] [address]     Coagulation Studies: No results for input(s): LABPROT, INR in the last 72 hours.  Urinalysis: No results for input(s): COLORURINE, LABSPEC, PHURINE, GLUCOSEU, HGBUR, BILIRUBINUR, KETONESUR, PROTEINUR,  UROBILINOGEN, NITRITE, LEUKOCYTESUR in the last 72 hours.  Invalid input(s): APPERANCEUR    Imaging: CT Angio Chest PE W and/or Wo Contrast  Result Date: 09/28/2019 CLINICAL DATA:  Chest pain since dialysis EXAM: CT ANGIOGRAPHY CHEST WITH CONTRAST TECHNIQUE: Multidetector CT imaging of the chest was performed using the standard protocol during bolus administration of intravenous contrast. Multiplanar CT image reconstructions and MIPs were obtained to evaluate the vascular anatomy. CONTRAST:  178mL OMNIPAQUE IOHEXOL 350 MG/ML SOLN COMPARISON:  Radiograph 09/27/2019 FINDINGS: Cardiovascular: Satisfactory opacification the pulmonary arteries. Respiratory motion and cardiac pulsation artifacts limit evaluation beyond the lobar levels. No large central or lobar pulmonary artery emboli are seen. Central pulmonary arteries are upper limits normal caliber. There is mild cardiomegaly. Three-vessel coronary artery disease is seen. No pericardial effusion. Calcifications present on the aortic leaflets. Atherosclerotic plaque within the normal caliber aorta. No periaortic stranding, hemorrhage or discernible acute luminal abnormality. Normal 3 vessel branching of the aortic arch. Proximal great vessels are mildly calcified but otherwise unremarkable. Mediastinum/Nodes: Few borderline enlarged low-attenuation mediastinal and hilar nodes are present for instance a 13 mm subcarinal node (4/47), a 14 mm precarinal lymph node (4/39), and a 12 mm right hilar node (4/45). No acute abnormality of the trachea or esophagus. Thyroid gland and thoracic inlet are unremarkable. Lungs/Pleura: Centrilobular and paraseptal emphysema with an apical predominance. Small pulmonary cyst noted in the left costophrenic sulcus, similar to prior. Apical predominant interlobular septal thickening with peribronchovascular cuffing. Atelectatic changes in the lung bases, likely accentuated by imaging during exhalation for the angiographic technique.  Upper Abdomen: No acute abnormalities present in the visualized portions of the upper abdomen. Atherosclerotic calcification of the abdominal aorta and branch vessels is noted. Mild lobular thickening of the left adrenal gland is nonspecific. No visible discrete adrenal masses. Musculoskeletal: Minimal discogenic and facet degenerative change in the thoracic spine. Mild cortical thickening and increased attenuation of the right sixth rib, could reflect early pagetoid involvement. No other acute or suspicious osseous lesions. Review of the MIP images confirms the above findings. IMPRESSION: 1. No large central or lobar pulmonary artery emboli. Respiratory motion and cardiac pulsation artifacts limit evaluation beyond the lobar levels. 2. Centrilobular and paraseptal emphysema with an apical predominance. Emphysema (ICD10-J43.9). 3. Septal thickening with peribronchovascular cuffing may reflect a component of mild interstitial edema. 4. Few borderline enlarged low-attenuation mediastinal and hilar nodes are likely reactive. 5. Mild cortical thickening and increased attenuation of the right sixth rib, could reflect early pagetoid involvement. No other acute or suspicious osseous lesions. 6. Three-vessel coronary artery disease. 7.  Aortic Atherosclerosis (ICD10-I70.0). Electronically Signed   By: Lovena Le M.D.   On: 09/28/2019 00:07   DG Chest Portable 1 View  Result Date: 09/27/2019 CLINICAL DATA:  Chest pain EXAM: PORTABLE CHEST 1 VIEW COMPARISON:  07/13/2019 FINDINGS: Cardiomegaly with vascular congestion. No confluent opacities or effusions. No acute bony abnormality. IMPRESSION: Cardiomegaly, vascular congestion. Electronically Signed   By:  Rolm Baptise M.D.   On: 09/27/2019 22:19     Medications:    . amLODipine  10 mg Oral Daily  . aspirin  324 mg Oral NOW   Or  . aspirin  300 mg Rectal NOW  . [START ON 09/29/2019] aspirin EC  81 mg Oral Daily  . atorvastatin  40 mg Oral q1800  . heparin   5,000 Units Subcutaneous Q8H  . hydrALAZINE  100 mg Oral TID  . irbesartan  75 mg Oral QHS  . isosorbide mononitrate  60 mg Oral Daily  . oxyCODONE  20 mg Oral Q12H   morphine injection, nitroGLYCERIN, ondansetron (ZOFRAN) IV, oxyCODONE  Assessment/ Plan:  64 y.o.African American male with end-stage renal disease, previous history of substance abuse, history of pulmonary embolism, depression, three-vessel coronary disease-seen on CT, hepatitis C, emphysema, aortic atherosclerosis   Principal Problem:   Chest pain Active Problems:   Hypertension   Chronic pain   ESRD (end stage renal disease) on dialysis (HCC)   Chronic diastolic CHF (congestive heart failure) (HCC)   History of MI (myocardial infarction)   Anemia of chronic kidney failure, stage 5 ([place])   History of pulmonary embolism  [facility]/MWF  #. ESRD We will plan for next hemodialysis on Monday Patient states he still voids frequently therefore will place him on high-dose torsemide  #. Anemia of CKD  Lab Results  Component Value Date   HGB 8.9 (L) 09/27/2019   Low dose EPO with HD  #. SHPTH     Component Value Date/Time   PTH 758 (H) 08/15/2018 1117   Lab Results  Component Value Date   PHOS 9.0 (H) 06/21/2019   Monitor calcium and phos level during this admission Continue Auryxia, sevelamer at  Home dose  #Chest pain, tachycardia during dialysis  CTA neg for PE 09/27/19 Cardiology evaluation in progress. Plan for echocardiogram     LOS: 0 Cristen Bredeson 3/20/202112:01 PM  [facility] [place], [place]

## 2019-09-28 NOTE — Plan of Care (Signed)
  Problem: Education: Goal: Knowledge of General Education information will improve Description: Including pain rating scale, medication(s)/side effects and non-pharmacologic comfort measures Outcome: Progressing Note: Patient profile completed. Skin intact. Patient continues to have chest pain 5/10 but states this is an improvement.

## 2019-09-28 NOTE — ED Notes (Signed)
ED Provider at bedside. 

## 2019-09-28 NOTE — Progress Notes (Signed)
Report given to 2A Nurse. Patient will be going to [id number]

## 2019-09-28 NOTE — Consult Note (Addendum)
Cardiology Consultation:   Patient ID: [name] MRN: [id number]; DOB: [birth year]  Admit date: 09/27/2019 Date of Consult: 09/28/2019  Primary Care Provider: Patient, No Pcp Per Primary Cardiologist:[facility], Dr. Lovena Hodge rounding Primary Electrophysiologist:  None    Patient Profile:   [name] is a 64 y.o. male with a hx of chronic atypical CP, HLD, HTN, HFpEF, history of PE on Eliquis with poor compliance, CAD by CT, ESRD on HD, thrombocytopenia, h/o substance abuse, current smoker, and who is being seen today for the evaluation of atypical chest pain at the request of Dr. Mal Misty.  History of Present Illness:   [name] is a 64 yo male with PMH as above.  He has a younger brother with heart dz / stenting. He is a current smoker with 2 cigarettes daily and working to cut back (previously quit in the past but restarted).  He reports a history of substance abuse; however, he has reportedly since stopped as he is about to receive a kidney from his cousin.  He does not drink alcohol.  Reports a history of pulmonary embolism with intermittent Eliquis compliance.  He states that the Eliquis makes him bleed too much during dialysis.  He reports his last Eliquis dose as months ago.  He reports that he does not follow with a cardiologist regularly.  In addition, he reportedly usually is in [place] so does not have a local nephrologist but does receive dialysis through [place].  On 09/21/2019, he reported to [facility] ED with report of chest pain that began during his hemodialysis.  He is almost all the way through his HD 1 CP began.  He describes it as centrally located and "an aching deep inside his chest" with radiation through to his back.  He denies tenderness to palpation.  The chest pain is further described as constant and somewhat pleuritic.  It is rated 7/10 in severity and constant from the time of onset.  He reports that the CP feels much like when he had his pulmonary embolism years  ago (with CT negative as below).  As above, he has not been taking his Eliquis.  Associated symptoms included racing heart rate at the time of hemodialysis.  He denies any palpitations, presyncope, or syncope.  He denies any recent lower extremity edema but does report abdominal distention. He reports some nausea but no emesis. No significant orthopnea beyond that of his usual per patient. Of note, he reports that he does take his blood pressure at home with SBP 160s, which is not atypical for him.  He reports medication compliance other than his Eliquis.  He also reports a very sore hemodialysis port, and that he feels he has significant swelling.   ---- In the ED, vitals significant for temperature 97.5 F, HR 70, RR 25, BP 153/87, 99% SpO2.   CXR significant for cardiomegaly with vascular congestion.  CTA was performed and showed no large central or lobar pulmonary artery emboli.  Respiratory motion and cardiac pulsation artifacts were noted to limit evaluation beyond the lobar levels.  Also noted with central lobar and paraseptal emphysema with apical predominance.  Possible mild interstitial edema was noted, along with a few borderline enlarged low-attenuation mediastinal and hilar nodes. 3v CAD and aortic atherosclerosis noted.  Labs significant for potassium 3.6, creatinine 5.90, BUN 29, BNP greater than 4500.0 troponin 68  62.  Hemoglobin 8.9, hematocrit 26.7, platelets 120. EKG not available for review of EMR and also not paper chart.  Heart Pathway  Score:     Past Medical History:  Diagnosis Date  . Depression   . Heart attack ([place])   . Hypertension   . PE (pulmonary embolism)   . Renal disorder     Past Surgical History:  Procedure Laterality Date  . TOTAL HIP ARTHROPLASTY       Home Medications:  Prior to Admission medications   Medication Sig Start Date End Date Taking? Authorizing Provider  albuterol (VENTOLIN HFA) 108 (90 Base) MCG/ACT inhaler Inhale 2 puffs into the lungs every  6 (six) hours as needed for wheezing or shortness of breath. 06/23/19  Yes Jennye Boroughs, MD  amLODipine (NORVASC) 10 MG tablet Take 1 tablet (10 mg total) by mouth daily. 06/23/19 06/22/20 Yes Jennye Boroughs, MD  apixaban (ELIQUIS) 2.5 MG TABS tablet Take 1 tablet (2.5 mg total) by mouth 2 (two) times daily. 06/23/19  Yes Jennye Boroughs, MD  atorvastatin (LIPITOR) 40 MG tablet Take 1 tablet (40 mg total) by mouth at bedtime. 06/23/19 06/22/20 Yes Jennye Boroughs, MD  AURYXIA 1 GM 210 MG(Fe) tablet Take 210 mg by mouth See admin instructions. Take 1 tablet (210mg ) by mouth three times daily with meals and take 1 tablet (210mg ) by mouth with snacks 06/28/19  Yes [provider]  hydrALAZINE (APRESOLINE) 100 MG tablet Take 1 tablet (100 mg total) by mouth 3 (three) times daily. 06/23/19  Yes Jennye Boroughs, MD  irbesartan (AVAPRO) 75 MG tablet Take 1 tablet (75 mg total) by mouth at bedtime. 06/23/19  Yes Jennye Boroughs, MD  ondansetron (ZOFRAN) 4 MG tablet Take 1 tablet (4 mg total) by mouth every 6 (six) hours as needed for nausea. 07/08/19  Yes Wieting, Richard, MD  oxyCODONE (OXY IR/ROXICODONE) 5 MG immediate release tablet Take 5-10 mg by mouth every 6 (six) hours as needed for severe pain.   Yes [provider]  oxyCODONE (OXYCONTIN) 20 mg 12 hr tablet Take 20 mg by mouth every 12 (twelve) hours.   Yes [provider]  polyethylene glycol (MIRALAX / GLYCOLAX) 17 g packet Take 17 g by mouth daily as needed for mild constipation. 07/08/19  Yes Wieting, Richard, MD  sevelamer carbonate (RENVELA) 800 MG tablet Take 1 tablet (800 mg total) by mouth 3 (three) times daily with meals. 06/23/19  Yes Jennye Boroughs, MD  tamsulosin (FLOMAX) 0.4 MG CAPS capsule Take 1 capsule (0.4 mg total) by mouth daily. 09/27/16  Yes Gladstone Lighter, MD  isosorbide mononitrate (IMDUR) 60 MG 24 hr tablet Take 1 tablet (60 mg total) by mouth daily. Patient not taking: Reported on 09/28/2019  06/23/19   Jennye Boroughs, MD    Inpatient Medications: Scheduled Meds: . aspirin  324 mg Oral NOW   Or  . aspirin  300 mg Rectal NOW  . [START ON 09/29/2019] aspirin EC  81 mg Oral Daily  . atorvastatin  40 mg Oral q1800  . heparin  5,000 Units Subcutaneous Q8H  . oxyCODONE  20 mg Oral Q12H   Continuous Infusions:  PRN Meds: morphine injection, nitroGLYCERIN, ondansetron (ZOFRAN) IV, oxyCODONE  Allergies:    Allergies  Allergen Reactions  . Cyclobenzaprine Nausea Only  . Clonidine Other (See Comments)    Asymptomatic bradycardia to 38  . Furosemide Other (See Comments)    Caused renal failure  . Tylenol [Acetaminophen] Other (See Comments)    Reaction:  Pt states it bothers his liver    Social History:   Social History   Socioeconomic History  . Marital status: Divorced  Spouse name: Not on file  . Number of children: Not on file  . Years of education: Not on file  . Highest education level: Not on file  Occupational History  . Not on file  Tobacco Use  . Smoking status: Current Every Day Smoker    Packs/day: 0.50    Types: Cigarettes  . Smokeless tobacco: Never Used  Substance and Sexual Activity  . Alcohol use: No  . Drug use: No    Comment: pt has been positive for cocaine twice this week but denies use  . Sexual activity: Not on file  Other Topics Concern  . Not on file  Social History Narrative  . Not on file   Social Determinants of Health   Financial Resource Strain:   . Difficulty of Paying Living Expenses:   Food Insecurity:   . Worried About Charity fundraiser in the Last Year:   . Arboriculturist in the Last Year:   Transportation Needs:   . Film/video editor (Medical):   Marland Kitchen Lack of Transportation (Non-Medical):   Physical Activity:   . Days of Exercise per Week:   . Minutes of Exercise per Session:   Stress:   . Feeling of Stress :   Social Connections:   . Frequency of Communication with Friends and Family:   . Frequency of  Social Gatherings with Friends and Family:   . Attends Religious Services:   . Active Member of Clubs or Organizations:   . Attends Archivist Meetings:   Marland Kitchen Marital Status:   Intimate Partner Violence:   . Fear of Current or Ex-Partner:   . Emotionally Abused:   Marland Kitchen Physically Abused:   . Sexually Abused:     Family History:   No family history on file.  Younger brother with CAD and PCI/stenting.  ROS:  Please see the history of present illness.  Review of Systems  Constitutional: Negative for weight loss.  Respiratory: Positive for shortness of breath. Negative for hemoptysis.   Cardiovascular: Positive for chest pain and orthopnea. Negative for palpitations, leg swelling and PND.       Chronic and baseline  Gastrointestinal: Positive for nausea. Negative for blood in stool, melena and vomiting.  Genitourinary: Negative for hematuria.  Neurological: Negative for dizziness and loss of consciousness.    All other ROS reviewed and negative.     Physical Exam/Data:   Vitals:   09/28/19 0130 09/28/19 0449 09/28/19 0457 09/28/19 0817  BP: (!) 163/91 (!) 156/96  (!) 144/94  Pulse: 78 74  74  Resp: 20 16  18   Temp:  (!) 97.5 F (36.4 C)    TempSrc:  Oral  Oral  SpO2: (!) 88% 100%  100%  Weight:   108.3 kg   Height:   6' (1.829 m)    No intake or output data in the 24 hours ending 09/28/19 0842 Last 3 Weights 09/28/2019 09/27/2019 07/13/2019  Weight (lbs) 238 lb 11.2 oz 240 lb 242 lb 8.1 oz  Weight (kg) 108.274 kg 108.863 kg 110 kg     Body mass index is 32.37 kg/m.  General:  Well nourished, well developed, in no acute distress HEENT: normal Neck: JVD difficult to assess 2/2 body habitus Vascular: No carotid bruits; radial pulses 2+ bilaterally Cardiac:  normal S1, S2; RRR with frequent extrasystole appreciated; no murmur  Lungs: trace bibasilar crackles, no wheezing, rhonchi or rales  Abd: soft, nontender, no hepatomegaly  Ext: no edema Musculoskeletal:  No  deformities, BUE and BLE strength normal and equal Skin: warm and dry  Neuro:  No focal abnormalities noted Psych:  Normal affect   EKG:  The EKG was personally reviewed and demonstrates:  Not available per review of EMR. Also not in paper chart. Telemetry:  Telemetry was personally reviewed and demonstrates: NSR with PVCs and compensatory pauses  Relevant CV Studies: Echo ordered today  Echo 08/20/18 1. The left ventricle has normal systolic function of 96-78%. The cavity  size was normal. There is moderately increased left ventricular wall  thickness. Left ventricular diastolic parameters were normal.  2. The right ventricle has normal systolic function. The cavity was  mildly enlarged. There is no increase in right ventricular wall thickness.  3. Trivial pericardial effusion.  4. The mitral valve is normal in structure.  5. The tricuspid valve is normal in structure. Tricuspid valve  regurgitation is mild-moderate.  6. The aortic valve is normal in structure. Aortic valve regurgitation is  mild by color flow Doppler.  7. The pulmonic valve was normal in structure.   Laboratory Data:  High Sensitivity Troponin:   Recent Labs  Lab 09/27/19 2150 09/28/19 0018  TROPONINIHS 68* 62*     Cardiac EnzymesNo results for input(s): TROPONINI in the last 168 hours. No results for input(s): TROPIPOC in the last 168 hours.  Chemistry Recent Labs  Lab 09/27/19 2150  NA 141  K 3.6  CL 102  CO2 31  GLUCOSE 121*  BUN 29*  CREATININE 5.90*  CALCIUM 8.1*  GFRNONAA 9*  GFRAA 11*  ANIONGAP 8    No results for input(s): PROT, ALBUMIN, AST, ALT, ALKPHOS, BILITOT in the last 168 hours. Hematology Recent Labs  Lab 09/27/19 2150  WBC 4.9  RBC 2.94*  HGB 8.9*  HCT 26.7*  MCV 90.8  MCH 30.3  MCHC 33.3  RDW 13.5  PLT 120*   BNP Recent Labs  Lab 09/27/19 2150  BNP >4,500.0*    DDimer No results for input(s): DDIMER in the last 168 hours.   Radiology/Studies:  CT  Angio Chest PE W and/or Wo Contrast  Result Date: 09/28/2019 CLINICAL DATA:  Chest pain since dialysis EXAM: CT ANGIOGRAPHY CHEST WITH CONTRAST TECHNIQUE: Multidetector CT imaging of the chest was performed using the standard protocol during bolus administration of intravenous contrast. Multiplanar CT image reconstructions and MIPs were obtained to evaluate the vascular anatomy. CONTRAST:  164mL OMNIPAQUE IOHEXOL 350 MG/ML SOLN COMPARISON:  Radiograph 09/27/2019 FINDINGS: Cardiovascular: Satisfactory opacification the pulmonary arteries. Respiratory motion and cardiac pulsation artifacts limit evaluation beyond the lobar levels. No large central or lobar pulmonary artery emboli are seen. Central pulmonary arteries are upper limits normal caliber. There is mild cardiomegaly. Three-vessel coronary artery disease is seen. No pericardial effusion. Calcifications present on the aortic leaflets. Atherosclerotic plaque within the normal caliber aorta. No periaortic stranding, hemorrhage or discernible acute luminal abnormality. Normal 3 vessel branching of the aortic arch. Proximal great vessels are mildly calcified but otherwise unremarkable. Mediastinum/Nodes: Few borderline enlarged low-attenuation mediastinal and hilar nodes are present for instance a 13 mm subcarinal node (4/47), a 14 mm precarinal lymph node (4/39), and a 12 mm right hilar node (4/45). No acute abnormality of the trachea or esophagus. Thyroid gland and thoracic inlet are unremarkable. Lungs/Pleura: Centrilobular and paraseptal emphysema with an apical predominance. Small pulmonary cyst noted in the left costophrenic sulcus, similar to prior. Apical predominant interlobular septal thickening with peribronchovascular cuffing. Atelectatic changes in the lung bases, likely accentuated by  imaging during exhalation for the angiographic technique. Upper Abdomen: No acute abnormalities present in the visualized portions of the upper abdomen.  Atherosclerotic calcification of the abdominal aorta and branch vessels is noted. Mild lobular thickening of the left adrenal gland is nonspecific. No visible discrete adrenal masses. Musculoskeletal: Minimal discogenic and facet degenerative change in the thoracic spine. Mild cortical thickening and increased attenuation of the right sixth rib, could reflect early pagetoid involvement. No other acute or suspicious osseous lesions. Review of the MIP images confirms the above findings. IMPRESSION: 1. No large central or lobar pulmonary artery emboli. Respiratory motion and cardiac pulsation artifacts limit evaluation beyond the lobar levels. 2. Centrilobular and paraseptal emphysema with an apical predominance. Emphysema (ICD10-J43.9). 3. Septal thickening with peribronchovascular cuffing may reflect a component of mild interstitial edema. 4. Few borderline enlarged low-attenuation mediastinal and hilar nodes are likely reactive. 5. Mild cortical thickening and increased attenuation of the right sixth rib, could reflect early pagetoid involvement. No other acute or suspicious osseous lesions. 6. Three-vessel coronary artery disease. 7.  Aortic Atherosclerosis (ICD10-I70.0). Electronically Signed   By: [name] M.D.   On: 09/28/2019 00:07   DG Chest Portable 1 View  Result Date: 09/27/2019 CLINICAL DATA:  Chest pain EXAM: PORTABLE CHEST 1 VIEW COMPARISON:  07/13/2019 FINDINGS: Cardiomegaly with vascular congestion. No confluent opacities or effusions. No acute bony abnormality. IMPRESSION: Cardiomegaly, vascular congestion. Electronically Signed   By: Rolm Baptise M.D.   On: 09/27/2019 22:19    Assessment and Plan:   Atypical Chest Pain CAD noted by CT --Reports current 7/10 chest pain that is centrally located and similar to that of his PE.  CP with atypical features and he does have a h/o atypical CP during HD. CTA performed and negative for PE.  BNP significantly elevated with hemodialysis cut short  due to his chest pain.  EKG not available per EMR or paper chart; however, on review of other notes, documented as no significant ST or T changes.  Telemetry shows NSR with frequent ectopy. High-sensitivity troponin minimally elevated and flat trending with peak of 68, not consistent with ACS. HS Tn likely elevated in the setting of ESRD on HD, anemia of chronic dz, and volume overload. Consider volume overload, pulmonary dz, and elevated BP as contributing to his CP. Given his risk factors for cardiac etiology, including current smoker, cannot rule out cardiac ischemia.  Given ongoing CP, will obtain an echocardiogram to reassess his EF and rule out acute structural abnormalities/valvular disease.  Ideally, given his risk factors, he will benefit from further ischemic workup. Continue PTA medications. Further recommendations if needed pending echo.  Chronic HFpEF --Reports SOB with his CP and did cut his HD short. Bibasilar crackles appreciated on exam with BNP significantly elevated as above. Updated echo pending with previous echo as above and nl EF. Continue PTA medications and titrate as needed for BP control. Recommend nephrology consult and volume management per nephrology.  History of Pulmonary Embolism --Consulted patient on the importance of compliance with reduced dose Eliquis 2.5mg  BID and with patient understanding. Encouraged ongoing attempts at smoking cessation.  HTN --Most recent BP slightly elevated, and this will likely improve with hemodialysis and when improved volume status. PTA medications include amlodipine 10 mg daily, hydralazine 100 mg TID, Imdur 60mg  daily,  and losartan 75 mg daily.  HLD --Continue Lipitor 40mg  daily for risk factor modification.  ESRD on HD (MWF) --Recommend nephrology consult.  For questions or updates, please contact [place]  Please consult [email or web address] for contact info under   Signed, [name]  09/28/2019 8:42 AM     Cardiology Attending  Patient seen and examined. Agree with the findings as noted above. The patient presents with atypical chest pain, a strong family of CAD, and 3 vessel disease by pulmonary CT. His enzymes are unrevealing and his ECG is not done but has been ordered. I have discussed the treatment options. His symptoms are not typical for CAD but that they occurred during the stress of HD, more concerning. A 2D echo is pending but I would be inclined to proceed with left heart cath to evaluate the severity of his 3 vessel CAD demonstrated on CT scan.   Mikle Bosworth.D.

## 2019-09-28 NOTE — Progress Notes (Addendum)
Patient seen and examined at the bedside.  He still has some chest pain but otherwise he feels okay.  He stopped taking his blood thinner about a month ago because it caused a lot of bleeding from his dialysis access site.  He said he's had pulmonary embolism twice and the last 1 was about a year ago.  Physical exam is unremarkable.  Diagnostic data was discussed with the patient.  Continue current treatment.  Follow-up with cardiologist for further recommendations.

## 2019-09-28 NOTE — Progress Notes (Signed)
Patient states achy pain in mid chest area. Pt states pain level an 8 and request for home meds that he takes for the pain. Notified attending physician and pharmcist.Administered pain med per orders. Will continue to monitor as needed.

## 2019-09-28 NOTE — ED Provider Notes (Signed)
12:37 AM Assumed care for off going team.   Blood pressure (!) 166/91, pulse 71, temperature (!) 97.5 F (36.4 C), temperature source Oral, resp. rate 20, height 6' (1.829 m), weight 108.9 kg, SpO2 100 %.  See their HPI for full report but in brief CT, repeat trop, re-eval pain    CT scan was limited due to some motion artifact but no large central or lobar PE.  There was some concern for possible interstitial edema.  There is also three-vessel coronary artery disease.  12:43 AM reevaluated patient continues to have some chest pressure sensation that is not gone away at all with the pain medicines.  Patient does report catheterization years ago but nothing recently.  Patient did not get a full dialysis session yesterday so this could be secondary to some fluid overload although patient not hypoxic and is pretty much able to lay flat.  Will discuss with hospital team for admission given his continued chest pain.        Vanessa Harding[place], MD 09/28/19 [id number]

## 2019-09-29 ENCOUNTER — Inpatient Hospital Stay (HOSPITAL_COMMUNITY)
Admit: 2019-09-29 | Discharge: 2019-09-29 | Disposition: A | Discharge status: Home / Self Care | Payer: Medicare Other | Attending: Internal Medicine | Admitting: Internal Medicine

## 2019-09-29 DIAGNOSIS — D631 Anemia in chronic kidney disease: Secondary | ICD-10-CM

## 2019-09-29 DIAGNOSIS — N185 Chronic kidney disease, stage 5: Secondary | ICD-10-CM

## 2019-09-29 DIAGNOSIS — R079 Chest pain, unspecified: Secondary | ICD-10-CM

## 2019-09-29 LAB — HEMOGLOBIN A1C
Hgb A1c MFr Bld: 5 % (ref 4.8–5.6)
Mean Plasma Glucose: 96.8 mg/dL

## 2019-09-29 LAB — ECHOCARDIOGRAM COMPLETE
Height: 72 in
Weight: 3829.65 oz

## 2019-09-29 MED ORDER — ASPIRIN 81 MG PO CHEW
81.0000 mg | CHEWABLE_TABLET | ORAL | Status: AC
  Administered 2019-09-30: 81 mg via ORAL
  Filled 2019-09-29: qty 1

## 2019-09-29 MED ORDER — SODIUM CHLORIDE 0.9% FLUSH
3.0000 mL | INTRAVENOUS | Status: DC | PRN
  Administered 2019-09-30: 3 mL via INTRAVENOUS

## 2019-09-29 MED ORDER — SODIUM CHLORIDE 0.9% FLUSH
3.0000 mL | Freq: Two times a day (BID) | INTRAVENOUS | Status: DC
  Administered 2019-09-29 (×2): 3 mL via INTRAVENOUS

## 2019-09-29 MED ORDER — CHLORHEXIDINE GLUCONATE CLOTH 2 % EX PADS
6.0000 | MEDICATED_PAD | Freq: Every day | CUTANEOUS | Status: DC
  Administered 2019-09-30: 6 via TOPICAL

## 2019-09-29 MED ORDER — SODIUM CHLORIDE 0.9 % IV SOLN: INTRAVENOUS | Status: DC

## 2019-09-29 MED ORDER — SODIUM CHLORIDE 0.9 % IV SOLN: 250.0000 mL | INTRAVENOUS | Status: DC | PRN

## 2019-09-29 NOTE — Progress Notes (Signed)
Progress Note  Patient Name: [name] Date of Encounter: 09/29/2019  Primary Cardiologist: No primary care provider on file.   Subjective   Chest pain improved  Inpatient Medications    Scheduled Meds:  amLODipine  10 mg Oral Daily   aspirin EC  81 mg Oral Daily   atorvastatin  40 mg Oral q1800   heparin  5,000 Units Subcutaneous Q8H   hydrALAZINE  100 mg Oral TID   irbesartan  75 mg Oral QHS   isosorbide mononitrate  60 mg Oral Daily   oxyCODONE  20 mg Oral Q12H   sodium chloride flush  3 mL Intravenous Q12H   sodium chloride flush  3 mL Intravenous Q12H   torsemide  100 mg Oral Daily   Continuous Infusions:  PRN Meds: morphine injection, nitroGLYCERIN, ondansetron (ZOFRAN) IV, oxyCODONE   Vital Signs    Vitals:   09/28/19 1711 09/28/19 2038 09/29/19 0445 09/29/19 0830  BP: (!) 144/93 (!) 167/95 (!) 150/90 (!) 144/87  Pulse: 70 82 71 75  Resp: 18 20 18 17   Temp: 98.2 F (36.8 C) 98.3 F (36.8 C) 98 F (36.7 C) 97.7 F (36.5 C)  TempSrc: Oral Oral Oral   SpO2: 100% 100% 100% 96%  Weight:   108.6 kg   Height:        Intake/Output Summary (Last 24 hours) at 09/29/2019 1056 Last data filed at 09/28/2019 1855 Gross per 24 hour  Intake --  Output 350 ml  Net -350 ml   Filed Weights   09/27/19 2143 09/28/19 0457 09/29/19 0445  Weight: 108.9 kg 108.3 kg 108.6 kg    Telemetry    nsr - Personally Reviewed  ECG    NSR with High lateral STT changes c/w ischemia - Personally Reviewed  Physical Exam   GEN: No acute distress.   Neck: No JVD Cardiac: RRR, soft systolic murmur, rubs, or gallops.  Respiratory: Clear to auscultation bilaterally. GI: Soft, nontender, non-distended  MS: No edema; No deformity. Neuro:  Nonfocal  Psych: Normal affect   Labs    Chemistry Recent Labs  Lab 09/27/19 2150  NA 141  K 3.6  CL 102  CO2 31  GLUCOSE 121*  BUN 29*  CREATININE 5.90*  CALCIUM 8.1*  GFRNONAA 9*  GFRAA 11*  ANIONGAP 8      Hematology Recent Labs  Lab 09/27/19 2150  WBC 4.9  RBC 2.94*  HGB 8.9*  HCT 26.7*  MCV 90.8  MCH 30.3  MCHC 33.3  RDW 13.5  PLT 120*    Cardiac EnzymesNo results for input(s): TROPONINI in the last 168 hours. No results for input(s): TROPIPOC in the last 168 hours.   BNP Recent Labs  Lab 09/27/19 2150  BNP >4,500.0*     DDimer No results for input(s): DDIMER in the last 168 hours.   Radiology    CT Angio Chest PE W and/or Wo Contrast  Result Date: 09/28/2019 CLINICAL DATA:  Chest pain since dialysis EXAM: CT ANGIOGRAPHY CHEST WITH CONTRAST TECHNIQUE: Multidetector CT imaging of the chest was performed using the standard protocol during bolus administration of intravenous contrast. Multiplanar CT image reconstructions and MIPs were obtained to evaluate the vascular anatomy. CONTRAST:  115mL OMNIPAQUE IOHEXOL 350 MG/ML SOLN COMPARISON:  Radiograph 09/27/2019 FINDINGS: Cardiovascular: Satisfactory opacification the pulmonary arteries. Respiratory motion and cardiac pulsation artifacts limit evaluation beyond the lobar levels. No large central or lobar pulmonary artery emboli are seen. Central pulmonary arteries are upper limits normal caliber.  There is mild cardiomegaly. Three-vessel coronary artery disease is seen. No pericardial effusion. Calcifications present on the aortic leaflets. Atherosclerotic plaque within the normal caliber aorta. No periaortic stranding, hemorrhage or discernible acute luminal abnormality. Normal 3 vessel branching of the aortic arch. Proximal great vessels are mildly calcified but otherwise unremarkable. Mediastinum/Nodes: Few borderline enlarged low-attenuation mediastinal and hilar nodes are present for instance a 13 mm subcarinal node (4/47), a 14 mm precarinal lymph node (4/39), and a 12 mm right hilar node (4/45). No acute abnormality of the trachea or esophagus. Thyroid gland and thoracic inlet are unremarkable. Lungs/Pleura: Centrilobular and  paraseptal emphysema with an apical predominance. Small pulmonary cyst noted in the left costophrenic sulcus, similar to prior. Apical predominant interlobular septal thickening with peribronchovascular cuffing. Atelectatic changes in the lung bases, likely accentuated by imaging during exhalation for the angiographic technique. Upper Abdomen: No acute abnormalities present in the visualized portions of the upper abdomen. Atherosclerotic calcification of the abdominal aorta and branch vessels is noted. Mild lobular thickening of the left adrenal gland is nonspecific. No visible discrete adrenal masses. Musculoskeletal: Minimal discogenic and facet degenerative change in the thoracic spine. Mild cortical thickening and increased attenuation of the right sixth rib, could reflect early pagetoid involvement. No other acute or suspicious osseous lesions. Review of the MIP images confirms the above findings. IMPRESSION: 1. No large central or lobar pulmonary artery emboli. Respiratory motion and cardiac pulsation artifacts limit evaluation beyond the lobar levels. 2. Centrilobular and paraseptal emphysema with an apical predominance. Emphysema (ICD10-J43.9). 3. Septal thickening with peribronchovascular cuffing may reflect a component of mild interstitial edema. 4. Few borderline enlarged low-attenuation mediastinal and hilar nodes are likely reactive. 5. Mild cortical thickening and increased attenuation of the right sixth rib, could reflect early pagetoid involvement. No other acute or suspicious osseous lesions. 6. Three-vessel coronary artery disease. 7.  Aortic Atherosclerosis (ICD10-I70.0). Electronically Signed   By: Lovena Le M.D.   On: 09/28/2019 00:07   DG Chest Portable 1 View  Result Date: 09/27/2019 CLINICAL DATA:  Chest pain EXAM: PORTABLE CHEST 1 VIEW COMPARISON:  07/13/2019 FINDINGS: Cardiomegaly with vascular congestion. No confluent opacities or effusions. No acute bony abnormality. IMPRESSION:  Cardiomegaly, vascular congestion. Electronically Signed   By: Rolm Baptise M.D.   On: 09/27/2019 22:19    Cardiac Studies   2D echo is pending  Patient Profile     64 y.o. male admitted with chest pain, during HD, 3 vessel CAD by CT of the chest  Assessment & Plan    1. Atypical chest pain - he has multiple cardiac risk factors and 3 Vessel disease by CT of the chest. His enzymes have been negative. He has a strong family history. He has an ECG suggestive of LCX territory ischemia. We will proceed with left heart cath tomorrow 2. ESRD - he will need HD after heart cath. 3. HTN - his bp is moderately elevated and he may need additional medical therapy.     For questions or updates, please contact [place] Please consult [email or web address] for contact info under Cardiology/STEMI.      Signed, Cristopher Peru, MD  09/29/2019, 10:56 AM  Patient ID: [name], male   DOB: 1956/05/04, 64 y.o.   MRN: [id number]

## 2019-09-29 NOTE — Plan of Care (Signed)
  Problem: Clinical Measurements: Goal: Ability to maintain clinical measurements within normal limits will improve Outcome: Progressing  Continues to report pain;relief with ATC pain meds and prn meds for breakthrough pain.

## 2019-09-29 NOTE — Progress Notes (Signed)
Progress Note    [name]  BSJ:[id number] DOB: 05-Oct-1955  DOA: 09/27/2019 PCP: Patient, No Pcp Per      Brief Narrative:    Medical records reviewed and are as summarized below:  [name] is an 64 y.o. male with medical history significant for ESRD on HD MWF, CAD with history of MI but no PCI, history of PE, partially compliant with apixaban, diastolic heart failure, anemia of chronic disease and chronic pain on chronic opoids who presents to the emergency room after he developed chest pain during dialysis causing the session to be cut short by 1 hour.      Assessment/Plan:   Principal Problem:   Chest pain Active Problems:   Hypertension   Chronic pain   ESRD (end stage renal disease) on dialysis (HCC)   Chronic diastolic CHF (congestive heart failure) (HCC)   History of MI (myocardial infarction)   Anemia of chronic kidney failure, stage 5 (HCC)   History of pulmonary embolism    Chest pain   History of MI (myocardial infarction) -Mildly elevated troponins but levels are flat.  Continue aspirin and statins. -Had a CTA of his chest that did show triple-vessel disease -Plan for left heart cath tomorrow -2D echo is pending    History of pulmonary embolism -He said he has had pulmonary embolism on 2 different occasions.  However, he stopped Eliquis about a month ago because of increased bleeding from dialysis access site.  Discussed risks and benefits of anticoagulation and he has decided to resume Eliquis at discharge.  He said he still has prescriptions at home.    Hypertension -Continue antihypertensives    ESRD (end stage renal disease) on dialysis [facility]) -Follow-up with nephrologist for hemodialysis    Chronic diastolic CHF (congestive heart failure) (HCC) -Compensated  Chronic pain -Continue OxyContin and oxycodone IR    Anemia of chronic kidney failure, stage 5 (HCC) -Hemoglobin 8.9, down from baseline 10.8 -Continue to  monitor       Body mass index is 32.46 kg/m.  (Morbid obesity)   Family Communication/Anticipated D/C date and plan/Code Status   DVT prophylaxis: Heparin Code Status: Full code Family Communication: Plan discussed with patient Disposition Plan: Patient is from home.  Plan is to discharge him home after clearance from cardiologist.      Subjective:   C/o lower midsternal chest pain graded as 4/10.  No shortness of breath.    Objective:    Vitals:   09/28/19 2038 09/29/19 0445 09/29/19 0830 09/29/19 1142  BP: (!) 167/95 (!) 150/90 (!) 144/87 130/64  Pulse: 82 71 75 68  Resp: 20 18 17 17   Temp: 98.3 F (36.8 C) 98 F (36.7 C) 97.7 F (36.5 C) 97.7 F (36.5 C)  TempSrc: Oral Oral    SpO2: 100% 100% 96% 100%  Weight:  108.6 kg    Height:        Intake/Output Summary (Last 24 hours) at 09/29/2019 1428 Last data filed at 09/28/2019 1855 Gross per 24 hour  Intake --  Output 350 ml  Net -350 ml   Filed Weights   09/27/19 2143 09/28/19 0457 09/29/19 0445  Weight: 108.9 kg 108.3 kg 108.6 kg    Exam:  GEN: NAD SKIN: No rash EYES: EOMI ENT: MMM CV: RRR PULM: CTA B ABD: soft, ND, NT, +BS CNS: AAO x 3, non focal EXT: No edema or tenderness   Data Reviewed:   I have personally reviewed following labs  and imaging studies:  Labs: Labs show the following:   Basic Metabolic Panel: Recent Labs  Lab 09/27/19 2150 09/28/19 0018  NA 141  --   K 3.6  --   CL 102  --   CO2 31  --   GLUCOSE 121*  --   BUN 29*  --   CREATININE 5.90*  --   CALCIUM 8.1*  --   MG  --  2.1   GFR Estimated Creatinine Clearance: 16.7 mL/min (A) (by C-G formula based on SCr of 5.9 mg/dL (H)). Liver Function Tests: No results for input(s): AST, ALT, ALKPHOS, BILITOT, PROT, ALBUMIN in the last 168 hours. No results for input(s): LIPASE, AMYLASE in the last 168 hours. No results for input(s): AMMONIA in the last 168 hours. Coagulation profile No results for input(s): INR,  PROTIME in the last 168 hours.  CBC: Recent Labs  Lab 09/27/19 2150  WBC 4.9  NEUTROABS 3.1  HGB 8.9*  HCT 26.7*  MCV 90.8  PLT 120*   Cardiac Enzymes: No results for input(s): CKTOTAL, CKMB, CKMBINDEX, TROPONINI in the last 168 hours. BNP (last 3 results) No results for input(s): PROBNP in the last 8760 hours. CBG: No results for input(s): GLUCAP in the last 168 hours. D-Dimer: No results for input(s): DDIMER in the last 72 hours. Hgb A1c: Recent Labs    09/29/19 0456  HGBA1C 5.0   Lipid Profile: Recent Labs    09/28/19 0018  CHOL 135  HDL 48  LDLCALC 78  TRIG 46  CHOLHDL 2.8   Thyroid function studies: Recent Labs    09/28/19 0018  TSH 0.947   Anemia work up: No results for input(s): VITAMINB12, FOLATE, FERRITIN, TIBC, IRON, RETICCTPCT in the last 72 hours. Sepsis Labs: Recent Labs  Lab 09/27/19 2150  WBC 4.9    Microbiology Recent Results (from the past 240 hour(s))  SARS CORONAVIRUS 2 (TAT 6-24 HRS) Nasopharyngeal Nasopharyngeal Swab     Status: None   Collection Time: 09/28/19  1:42 AM   Specimen: Nasopharyngeal Swab  Result Value Ref Range Status   SARS Coronavirus 2 NEGATIVE NEGATIVE Final    Comment: (NOTE) SARS-CoV-2 target nucleic acids are NOT DETECTED. The SARS-CoV-2 RNA is generally detectable in upper and lower respiratory specimens during the acute phase of infection. Negative results do not preclude SARS-CoV-2 infection, do not rule out co-infections with other pathogens, and should not be used as the sole basis for treatment or other patient management decisions. Negative results must be combined with clinical observations, patient history, and epidemiological information. The expected result is Negative. Fact Sheet for Patients: SugarRoll.be Fact Sheet for Healthcare Providers: https://www.woods-mathews.com/ This test is not yet approved or cleared by the [place] FDA and  has been  authorized for detection and/or diagnosis of SARS-CoV-2 by FDA under an Emergency Use Authorization (EUA). This EUA will remain  in effect (meaning this test can be used) for the duration of the COVID-19 declaration under Section 56 4(b)(1) of the Act, 21 U.S.C. section 360bbb-3(b)(1), unless the authorization is terminated or revoked sooner. Performed at [facility], [place] [address], [place], [place] [address]   MRSA PCR Screening     Status: None   Collection Time: 09/28/19  4:47 AM   Specimen: Nasal Mucosa; Nasopharyngeal  Result Value Ref Range Status   MRSA by PCR NEGATIVE NEGATIVE Final    Comment:        The GeneXpert MRSA Assay (FDA approved for NASAL specimens only),  is one component of a comprehensive MRSA colonization surveillance program. It is not intended to diagnose MRSA infection nor to guide or monitor treatment for MRSA infections. Performed at [facility], [address] [place], [place] [address]     Procedures and diagnostic studies:  CT Angio Chest PE W and/or Wo Contrast  Result Date: 09/28/2019 CLINICAL DATA:  Chest pain since dialysis EXAM: CT ANGIOGRAPHY CHEST WITH CONTRAST TECHNIQUE: Multidetector CT imaging of the chest was performed using the standard protocol during bolus administration of intravenous contrast. Multiplanar CT image reconstructions and MIPs were obtained to evaluate the vascular anatomy. CONTRAST:  163mL OMNIPAQUE IOHEXOL 350 MG/ML SOLN COMPARISON:  Radiograph 09/27/2019 FINDINGS: Cardiovascular: Satisfactory opacification the pulmonary arteries. Respiratory motion and cardiac pulsation artifacts limit evaluation beyond the lobar levels. No large central or lobar pulmonary artery emboli are seen. Central pulmonary arteries are upper limits normal caliber. There is mild cardiomegaly. Three-vessel coronary artery disease is seen. No pericardial effusion. Calcifications present on the aortic leaflets. Atherosclerotic plaque  within the normal caliber aorta. No periaortic stranding, hemorrhage or discernible acute luminal abnormality. Normal 3 vessel branching of the aortic arch. Proximal great vessels are mildly calcified but otherwise unremarkable. Mediastinum/Nodes: Few borderline enlarged low-attenuation mediastinal and hilar nodes are present for instance a 13 mm subcarinal node (4/47), a 14 mm precarinal lymph node (4/39), and a 12 mm right hilar node (4/45). No acute abnormality of the trachea or esophagus. Thyroid gland and thoracic inlet are unremarkable. Lungs/Pleura: Centrilobular and paraseptal emphysema with an apical predominance. Small pulmonary cyst noted in the left costophrenic sulcus, similar to prior. Apical predominant interlobular septal thickening with peribronchovascular cuffing. Atelectatic changes in the lung bases, likely accentuated by imaging during exhalation for the angiographic technique. Upper Abdomen: No acute abnormalities present in the visualized portions of the upper abdomen. Atherosclerotic calcification of the abdominal aorta and branch vessels is noted. Mild lobular thickening of the left adrenal gland is nonspecific. No visible discrete adrenal masses. Musculoskeletal: Minimal discogenic and facet degenerative change in the thoracic spine. Mild cortical thickening and increased attenuation of the right sixth rib, could reflect early pagetoid involvement. No other acute or suspicious osseous lesions. Review of the MIP images confirms the above findings. IMPRESSION: 1. No large central or lobar pulmonary artery emboli. Respiratory motion and cardiac pulsation artifacts limit evaluation beyond the lobar levels. 2. Centrilobular and paraseptal emphysema with an apical predominance. Emphysema (ICD10-J43.9). 3. Septal thickening with peribronchovascular cuffing may reflect a component of mild interstitial edema. 4. Few borderline enlarged low-attenuation mediastinal and hilar nodes are likely reactive.  5. Mild cortical thickening and increased attenuation of the right sixth rib, could reflect early pagetoid involvement. No other acute or suspicious osseous lesions. 6. Three-vessel coronary artery disease. 7.  Aortic Atherosclerosis (ICD10-I70.0). Electronically Signed   By: Lovena Le M.D.   On: 09/28/2019 00:07   DG Chest Portable 1 View  Result Date: 09/27/2019 CLINICAL DATA:  Chest pain EXAM: PORTABLE CHEST 1 VIEW COMPARISON:  07/13/2019 FINDINGS: Cardiomegaly with vascular congestion. No confluent opacities or effusions. No acute bony abnormality. IMPRESSION: Cardiomegaly, vascular congestion. Electronically Signed   By: Rolm Baptise M.D.   On: 09/27/2019 22:19    Medications:   . amLODipine  10 mg Oral Daily  . aspirin EC  81 mg Oral Daily  . atorvastatin  40 mg Oral q1800  . [START ON 09/30/2019] Chlorhexidine Gluconate Cloth  6 each Topical Q0600  . heparin  5,000 Units Subcutaneous Q8H  .  hydrALAZINE  100 mg Oral TID  . irbesartan  75 mg Oral QHS  . isosorbide mononitrate  60 mg Oral Daily  . oxyCODONE  20 mg Oral Q12H  . sodium chloride flush  3 mL Intravenous Q12H  . sodium chloride flush  3 mL Intravenous Q12H  . torsemide  100 mg Oral Daily   Continuous Infusions:   LOS: 1 day   Whitni Pasquini  [facility]     09/29/2019, 2:28 PM

## 2019-09-29 NOTE — Progress Notes (Signed)
[place], [place] 09/29/19  Subjective:   LOS: 1 03/20 0701 - 03/21 0700 In: -  Out: 8 [Urine:350]  Last HD was on Friday Doing fair at present Denies any acute complaints No nausea or vomiting Presented to the emergency room for chest pain and high heart rate postdialysis Still feels like he has some chest discomfort.    Objective:  Vital signs in last 24 hours:  Temp:  [97.5 F (36.4 C)-98.3 F (36.8 C)] 97.7 F (36.5 C) (03/21 1142) Pulse Rate:  [68-82] 68 (03/21 1142) Resp:  [17-20] 17 (03/21 1142) BP: (130-167)/(64-95) 130/64 (03/21 1142) SpO2:  [96 %-100 %] 100 % (03/21 1142) Weight:  [108.6 kg] 108.6 kg (03/21 0445)  Weight change: -0.293 kg Filed Weights   09/27/19 2143 09/28/19 0457 09/29/19 0445  Weight: 108.9 kg 108.3 kg 108.6 kg    Intake/Output:    Intake/Output Summary (Last 24 hours) at 09/29/2019 1217 Last data filed at 09/28/2019 1855 Gross per 24 hour  Intake --  Output 350 ml  Net -350 ml     Physical Exam: General:  Obese gentleman, sitting up in bed  HEENT  anicteric, moist oral mucous member  Pulm/lungs  normal breathing effort, clear to auscultation  CVS/Heart  regular rhythm  Abdomen:   Soft, nontender  Extremities:  No peripheral edema  Neurologic:  Alert, oriented  Skin:  No acute rashes   Left upper extremity AV fistula       Basic Metabolic Panel:  Recent Labs  Lab 09/27/19 2150 09/28/19 0018  NA 141  --   K 3.6  --   CL 102  --   CO2 31  --   GLUCOSE 121*  --   BUN 29*  --   CREATININE 5.90*  --   CALCIUM 8.1*  --   MG  --  2.1     CBC: Recent Labs  Lab 09/27/19 2150  WBC 4.9  NEUTROABS 3.1  HGB 8.9*  HCT 26.7*  MCV 90.8  PLT 120*      Lab Results  Component Value Date   HEPBSAG NON REACTIVE 06/21/2019   HEPBSAB Non Reactive 11/15/2018   HEPBIGM Negative 11/15/2018      Microbiology:  Recent Results (from the past 240 hour(s))  SARS CORONAVIRUS 2 (TAT  6-24 HRS) Nasopharyngeal Nasopharyngeal Swab     Status: None   Collection Time: 09/28/19  1:42 AM   Specimen: Nasopharyngeal Swab  Result Value Ref Range Status   SARS Coronavirus 2 NEGATIVE NEGATIVE Final    Comment: (NOTE) SARS-CoV-2 target nucleic acids are NOT DETECTED. The SARS-CoV-2 RNA is generally detectable in upper and lower respiratory specimens during the acute phase of infection. Negative results do not preclude SARS-CoV-2 infection, do not rule out co-infections with other pathogens, and should not be used as the sole basis for treatment or other patient management decisions. Negative results must be combined with clinical observations, patient history, and epidemiological information. The expected result is Negative. Fact Sheet for Patients: SugarRoll.be Fact Sheet for Healthcare Providers: https://www.woods-mathews.com/ This test is not yet approved or cleared by the [place] FDA and  has been authorized for detection and/or diagnosis of SARS-CoV-2 by FDA under an Emergency Use Authorization (EUA). This EUA will remain  in effect (meaning this test can be used) for the duration of the COVID-19 declaration under Section 56 4(b)(1) of the Act, 21 U.S.C. section 360bbb-3(b)(1), unless the authorization is terminated or revoked sooner. Performed at  [facility], [place] [address] [place], [place] [address]   MRSA PCR Screening     Status: None   Collection Time: 09/28/19  4:47 AM   Specimen: Nasal Mucosa; Nasopharyngeal  Result Value Ref Range Status   MRSA by PCR NEGATIVE NEGATIVE Final    Comment:        The GeneXpert MRSA Assay (FDA approved for NASAL specimens only), is one component of a comprehensive MRSA colonization surveillance program. It is not intended to diagnose MRSA infection nor to guide or monitor treatment for MRSA infections. Performed at [facility], [address]  [place], [place] [address]     Coagulation Studies: No results for input(s): LABPROT, INR in the last 72 hours.  Urinalysis: No results for input(s): COLORURINE, LABSPEC, PHURINE, GLUCOSEU, HGBUR, BILIRUBINUR, KETONESUR, PROTEINUR, UROBILINOGEN, NITRITE, LEUKOCYTESUR in the last 72 hours.  Invalid input(s): APPERANCEUR    Imaging: CT Angio Chest PE W and/or Wo Contrast  Result Date: 09/28/2019 CLINICAL DATA:  Chest pain since dialysis EXAM: CT ANGIOGRAPHY CHEST WITH CONTRAST TECHNIQUE: Multidetector CT imaging of the chest was performed using the standard protocol during bolus administration of intravenous contrast. Multiplanar CT image reconstructions and MIPs were obtained to evaluate the vascular anatomy. CONTRAST:  142mL OMNIPAQUE IOHEXOL 350 MG/ML SOLN COMPARISON:  Radiograph 09/27/2019 FINDINGS: Cardiovascular: Satisfactory opacification the pulmonary arteries. Respiratory motion and cardiac pulsation artifacts limit evaluation beyond the lobar levels. No large central or lobar pulmonary artery emboli are seen. Central pulmonary arteries are upper limits normal caliber. There is mild cardiomegaly. Three-vessel coronary artery disease is seen. No pericardial effusion. Calcifications present on the aortic leaflets. Atherosclerotic plaque within the normal caliber aorta. No periaortic stranding, hemorrhage or discernible acute luminal abnormality. Normal 3 vessel branching of the aortic arch. Proximal great vessels are mildly calcified but otherwise unremarkable. Mediastinum/Nodes: Few borderline enlarged low-attenuation mediastinal and hilar nodes are present for instance a 13 mm subcarinal node (4/47), a 14 mm precarinal lymph node (4/39), and a 12 mm right hilar node (4/45). No acute abnormality of the trachea or esophagus. Thyroid gland and thoracic inlet are unremarkable. Lungs/Pleura: Centrilobular and paraseptal emphysema with an apical predominance. Small pulmonary cyst noted in the left  costophrenic sulcus, similar to prior. Apical predominant interlobular septal thickening with peribronchovascular cuffing. Atelectatic changes in the lung bases, likely accentuated by imaging during exhalation for the angiographic technique. Upper Abdomen: No acute abnormalities present in the visualized portions of the upper abdomen. Atherosclerotic calcification of the abdominal aorta and branch vessels is noted. Mild lobular thickening of the left adrenal gland is nonspecific. No visible discrete adrenal masses. Musculoskeletal: Minimal discogenic and facet degenerative change in the thoracic spine. Mild cortical thickening and increased attenuation of the right sixth rib, could reflect early pagetoid involvement. No other acute or suspicious osseous lesions. Review of the MIP images confirms the above findings. IMPRESSION: 1. No large central or lobar pulmonary artery emboli. Respiratory motion and cardiac pulsation artifacts limit evaluation beyond the lobar levels. 2. Centrilobular and paraseptal emphysema with an apical predominance. Emphysema (ICD10-J43.9). 3. Septal thickening with peribronchovascular cuffing may reflect a component of mild interstitial edema. 4. Few borderline enlarged low-attenuation mediastinal and hilar nodes are likely reactive. 5. Mild cortical thickening and increased attenuation of the right sixth rib, could reflect early pagetoid involvement. No other acute or suspicious osseous lesions. 6. Three-vessel coronary artery disease. 7.  Aortic Atherosclerosis (ICD10-I70.0). Electronically Signed   By: Lovena Le M.D.   On: 09/28/2019  00:07   DG Chest Portable 1 View  Result Date: 09/27/2019 CLINICAL DATA:  Chest pain EXAM: PORTABLE CHEST 1 VIEW COMPARISON:  07/13/2019 FINDINGS: Cardiomegaly with vascular congestion. No confluent opacities or effusions. No acute bony abnormality. IMPRESSION: Cardiomegaly, vascular congestion. Electronically Signed   By: Rolm Baptise M.D.   On:  09/27/2019 22:19     Medications:    . amLODipine  10 mg Oral Daily  . aspirin EC  81 mg Oral Daily  . atorvastatin  40 mg Oral q1800  . heparin  5,000 Units Subcutaneous Q8H  . hydrALAZINE  100 mg Oral TID  . irbesartan  75 mg Oral QHS  . isosorbide mononitrate  60 mg Oral Daily  . oxyCODONE  20 mg Oral Q12H  . sodium chloride flush  3 mL Intravenous Q12H  . sodium chloride flush  3 mL Intravenous Q12H  . torsemide  100 mg Oral Daily   morphine injection, nitroGLYCERIN, ondansetron (ZOFRAN) IV, oxyCODONE  Assessment/ Plan:  64 y.o.African American male with end-stage renal disease, previous history of substance abuse, history of pulmonary embolism, depression, three-vessel coronary disease-seen on CT, hepatitis C, emphysema, aortic atherosclerosis   Principal Problem:   Chest pain Active Problems:   Hypertension   Chronic pain   ESRD (end stage renal disease) on dialysis (HCC)   Chronic diastolic CHF (congestive heart failure) (HCC)   History of MI (myocardial infarction)   Anemia of chronic kidney failure, stage 5 ([place])   History of pulmonary embolism  [facility]/MWF  #. ESRD We will plan for next hemodialysis on Monday Continue torsemide  #. Anemia of CKD  Lab Results  Component Value Date   HGB 8.9 (L) 09/27/2019   Low dose EPO with HD  #. SHPTH     Component Value Date/Time   PTH 758 (H) 08/15/2018 1117   Lab Results  Component Value Date   PHOS 9.0 (H) 06/21/2019   Monitor calcium and phos level during this admission Continue Auryxia, sevelamer at  Home dose  #Chest pain, tachycardia during dialysis  CTA neg for PE 09/27/19 Cardiology evaluation in progress. Plan for echocardiogram     LOS: [place] 3/21/202112:17 PM  [place], [place]

## 2019-09-29 NOTE — Progress Notes (Signed)
Pt continues to report mid chest pain that radiates to his back. Pt states its not any worse than when he came in. Pt refuses nitro states it gives him a headache. RN educated pt that that's a side effect of nitro but if he is having chest pain then it may help to relieve it. Pt states he does not want a headache and will let me know when he does want to take it.pt request morephine for pain. Pt denies any dizziness. zofran given for nausea

## 2019-09-29 NOTE — Progress Notes (Signed)
*  PRELIMINARY RESULTS* Echocardiogram 2D Echocardiogram has been performed.  [name] [name] 09/29/2019, 2:11 PM

## 2019-09-30 ENCOUNTER — Encounter: Payer: Self-pay | Admitting: Cardiology

## 2019-09-30 ENCOUNTER — Inpatient Hospital Stay
Admission: EM | Disposition: A | Discharge status: Home / Self Care | Payer: Self-pay | Source: Ambulatory Visit | Attending: Internal Medicine

## 2019-09-30 DIAGNOSIS — R079 Chest pain, unspecified: Secondary | ICD-10-CM

## 2019-09-30 DIAGNOSIS — I25110 Atherosclerotic heart disease of native coronary artery with unstable angina pectoris: Secondary | ICD-10-CM

## 2019-09-30 HISTORY — PX: LEFT HEART CATH AND CORONARY ANGIOGRAPHY: CATH118249

## 2019-09-30 LAB — PHOSPHORUS: Phosphorus: 7.8 mg/dL — ABNORMAL HIGH (ref 2.5–4.6)

## 2019-09-30 LAB — ANTI-JO 1 ANTIBODY, IGG: Anti JO-1: <0.2 AI (ref 0.0–0.9)

## 2019-09-30 SURGERY — LEFT HEART CATH AND CORONARY ANGIOGRAPHY
Anesthesia: Moderate Sedation

## 2019-09-30 MED ORDER — TORSEMIDE 100 MG PO TABS: 100.0000 mg | ORAL_TABLET | Freq: Every day | ORAL | 0 refills | Status: DC

## 2019-09-30 MED ORDER — SODIUM CHLORIDE 0.9% FLUSH: 3.0000 mL | INTRAVENOUS | Status: DC | PRN

## 2019-09-30 MED ORDER — HEPARIN (PORCINE) IN NACL 1000-0.9 UT/500ML-% IV SOLN
INTRAVENOUS | Status: DC | PRN
  Administered 2019-09-30: 500 mL

## 2019-09-30 MED ORDER — SODIUM CHLORIDE 0.9% FLUSH
3.0000 mL | Freq: Two times a day (BID) | INTRAVENOUS | Status: DC
  Administered 2019-09-30: 3 mL via INTRAVENOUS

## 2019-09-30 MED ORDER — IOHEXOL 300 MG/ML  SOLN
INTRAMUSCULAR | Status: DC | PRN
  Administered 2019-09-30: 80 mL

## 2019-09-30 MED ORDER — MIDAZOLAM HCL 2 MG/2ML IJ SOLN
INTRAMUSCULAR | Status: AC
  Filled 2019-09-30: qty 2

## 2019-09-30 MED ORDER — HEPARIN (PORCINE) IN NACL 1000-0.9 UT/500ML-% IV SOLN
INTRAVENOUS | Status: AC
  Filled 2019-09-30: qty 1000

## 2019-09-30 MED ORDER — MIDAZOLAM HCL 2 MG/2ML IJ SOLN
INTRAMUSCULAR | Status: DC | PRN
  Administered 2019-09-30: 1 mg via INTRAVENOUS

## 2019-09-30 MED ORDER — FENTANYL CITRATE (PF) 100 MCG/2ML IJ SOLN
INTRAMUSCULAR | Status: AC
  Filled 2019-09-30: qty 2

## 2019-09-30 MED ORDER — LABETALOL HCL 5 MG/ML IV SOLN: 10.0000 mg | INTRAVENOUS | Status: AC | PRN

## 2019-09-30 MED ORDER — FENTANYL CITRATE (PF) 100 MCG/2ML IJ SOLN
INTRAMUSCULAR | Status: DC | PRN
  Administered 2019-09-30: 50 ug via INTRAVENOUS

## 2019-09-30 MED ORDER — SODIUM CHLORIDE 0.9 % IV SOLN: 250.0000 mL | INTRAVENOUS | Status: DC | PRN

## 2019-09-30 SURGICAL SUPPLY — 11 items
CANNULA 5F STIFF (CANNULA) ×3 IMPLANT
CATH INFINITI 5FR ANG PIGTAIL (CATHETERS) ×3 IMPLANT
CATH INFINITI 5FR JL4 (CATHETERS) ×3 IMPLANT
CATH INFINITI 5FR JL5 (CATHETERS) ×3 IMPLANT
CATH INFINITI JR4 5F (CATHETERS) ×3 IMPLANT
DEVICE CLOSURE MYNXGRIP 5F (Vascular Products) ×3 IMPLANT
KIT MANI 3VAL PERCEP (MISCELLANEOUS) ×3 IMPLANT
NEEDLE PERC 18GX7CM (NEEDLE) ×3 IMPLANT
PACK CARDIAC CATH (CUSTOM PROCEDURE TRAY) ×3 IMPLANT
SHEATH AVANTI 5FR X 11CM (SHEATH) ×3 IMPLANT
WIRE GUIDERIGHT .035X150 (WIRE) ×3 IMPLANT

## 2019-09-30 NOTE — Progress Notes (Signed)
Patient given discharge instructions by [facility] nurse [place]. IV's taken out and tele monitor off.

## 2019-09-30 NOTE — Progress Notes (Signed)
This note also relates to the following rows which could not be included: Pulse Rate - Cannot attach notes to unvalidated device data Resp - Cannot attach notes to unvalidated device data BP - Cannot attach notes to unvalidated device data  Hd completed  

## 2019-09-30 NOTE — Progress Notes (Signed)
[facility], [place] 09/30/19  Subjective:   LOS: 2 No intake/output data recorded.  Patient seen post cardiac cath Doing fair No intervention was done  Objective:  Vital signs in last 24 hours:  Temp:  [97.7 F (36.5 C)-98.7 F (37.1 C)] 98.2 F (36.8 C) (03/22 1305) Pulse Rate:  [67-81] 72 (03/22 1430) Resp:  [12-20] 13 (03/22 1430) BP: (127-155)/(65-91) 133/79 (03/22 1430) SpO2:  [93 %-100 %] 100 % (03/22 1330) Weight:  [109.8 kg] 109.8 kg (03/22 0213)  Weight change: 1.201 kg Filed Weights   09/28/19 0457 09/29/19 0445 09/30/19 0213  Weight: 108.3 kg 108.6 kg 109.8 kg    Intake/Output:    Intake/Output Summary (Last 24 hours) at 09/30/2019 1440 Last data filed at 09/30/2019 1113 Gross per 24 hour  Intake 15.56 ml  Output 0 ml  Net 15.56 ml     Physical Exam: General:  Obese gentleman, laying in the bed  HEENT  anicteric, moist oral mucous member  Pulm/lungs  normal breathing effort, clear to auscultation  CVS/Heart  regular rhythm  Abdomen:   Soft, nontender  Extremities:  No peripheral edema  Neurologic:  Alert, oriented  Skin:  No acute rashes   Left upper extremity AV fistula       Basic Metabolic Panel:  Recent Labs  Lab 09/27/19 2150 09/28/19 0018 09/30/19 0435  NA 141  --   --   K 3.6  --   --   CL 102  --   --   CO2 31  --   --   GLUCOSE 121*  --   --   BUN 29*  --   --   CREATININE 5.90*  --   --   CALCIUM 8.1*  --   --   MG  --  2.1  --   PHOS  --   --  7.8*     CBC: Recent Labs  Lab 09/27/19 2150  WBC 4.9  NEUTROABS 3.1  HGB 8.9*  HCT 26.7*  MCV 90.8  PLT 120*      Lab Results  Component Value Date   HEPBSAG NON REACTIVE 06/21/2019   HEPBSAB Non Reactive 11/15/2018   HEPBIGM Negative 11/15/2018      Microbiology:  Recent Results (from the past 240 hour(s))  SARS CORONAVIRUS 2 (TAT 6-24 HRS) Nasopharyngeal Nasopharyngeal Swab     Status: None   Collection Time: 09/28/19  1:42 AM    Specimen: Nasopharyngeal Swab  Result Value Ref Range Status   SARS Coronavirus 2 NEGATIVE NEGATIVE Final    Comment: (NOTE) SARS-CoV-2 target nucleic acids are NOT DETECTED. The SARS-CoV-2 RNA is generally detectable in upper and lower respiratory specimens during the acute phase of infection. Negative results do not preclude SARS-CoV-2 infection, do not rule out co-infections with other pathogens, and should not be used as the sole basis for treatment or other patient management decisions. Negative results must be combined with clinical observations, patient history, and epidemiological information. The expected result is Negative. Fact Sheet for Patients: SugarRoll.be Fact Sheet for Healthcare Providers: https://www.woods-mathews.com/ This test is not yet approved or cleared by the [place] FDA and  has been authorized for detection and/or diagnosis of SARS-CoV-2 by FDA under an Emergency Use Authorization (EUA). This EUA will remain  in effect (meaning this test can be used) for the duration of the COVID-19 declaration under Section 56 4(b)(1) of the Act, 21 U.S.C. section 360bbb-3(b)(1), unless the authorization is terminated or revoked  sooner. Performed at [facility], [place] [address] [place], [place] [address]   MRSA PCR Screening     Status: None   Collection Time: 09/28/19  4:47 AM   Specimen: Nasal Mucosa; Nasopharyngeal  Result Value Ref Range Status   MRSA by PCR NEGATIVE NEGATIVE Final    Comment:        The GeneXpert MRSA Assay (FDA approved for NASAL specimens only), is one component of a comprehensive MRSA colonization surveillance program. It is not intended to diagnose MRSA infection nor to guide or monitor treatment for MRSA infections. Performed at [facility], [address] [place], [place] [address]     Coagulation Studies: No results for input(s): LABPROT, INR in the last 72  hours.  Urinalysis: No results for input(s): COLORURINE, LABSPEC, PHURINE, GLUCOSEU, HGBUR, BILIRUBINUR, KETONESUR, PROTEINUR, UROBILINOGEN, NITRITE, LEUKOCYTESUR in the last 72 hours.  Invalid input(s): APPERANCEUR    Imaging: CARDIAC CATHETERIZATION  Result Date: 09/30/2019  Prox RCA lesion is 20% stenosed.  Prox RCA to Mid RCA lesion is 30% stenosed.  1.  Mildly calcified coronary arteries with mild nonobstructive coronary artery disease.  Difficult coronary angiography of the left system due to large coronary arteries with dilated aortic root.  Left coronary system was engaged with a JL 5 catheter. 2.  Left ventricular angiography was not performed.  EF was normal by echo.  Normal left ventricular end-diastolic pressure.  No significant gradient across the aortic valve. Recommendations: The chest pain seems to be noncardiac.  Recommend aggressive medical therapy for nonobstructive coronary artery disease. No further recommendations from our standpoint and the patient can be discharged home later today after dialysis if no other issues.   ECHOCARDIOGRAM COMPLETE  Result Date: 09/29/2019    ECHOCARDIOGRAM REPORT   Patient Name:   [name] Date of Exam: 09/29/2019 Medical Rec #:  [id number]         Height:       72.0 in Accession #:    [id number]        Weight:       239.4 lb Date of Birth:  07-11-1956         BSA:          2.300 m Patient Age:    64 years          BP:           150/90 mmHg Patient Gender: M                 HR:           68 bpm. Exam Location:  [facility] Procedure: 2D Echo Indications:     Chest Pain 786.50/ R07.9  History:         Patient has prior history of Echocardiogram examinations, most                  recent 08/20/2018.  Sonographer:     Arville Go RDCS Referring Phys:  [id number] Athena Masse Diagnosing Phys: Ida Rogue MD  Sonographer Comments: Technically difficult study due to poor echo windows. IMPRESSIONS  1. Left ventricular ejection fraction, by  estimation, is 55 to 60%. The left ventricle has normal function. The left ventricle has no regional wall motion abnormalities. There is moderate left ventricular hypertrophy. Left ventricular diastolic parameters are consistent with Grade II diastolic dysfunction (pseudonormalization).  2. Right ventricular systolic function is normal. The right ventricular size is normal.  3. Left atrial size was  mildly dilated.  4. The aortic valve was not well visualized, unable to exclude bicuspid aortic valve.  5. There is dilatation of the aortic root, 4.5 cm. FINDINGS  Left Ventricle: Left ventricular ejection fraction, by estimation, is 55 to 60%. The left ventricle has normal function. The left ventricle has no regional wall motion abnormalities. The left ventricular internal cavity size was normal in size. There is  moderate left ventricular hypertrophy. Left ventricular diastolic parameters are consistent with Grade II diastolic dysfunction (pseudonormalization). Right Ventricle: The right ventricular size is normal. No increase in right ventricular wall thickness. Right ventricular systolic function is normal. Left Atrium: Left atrial size was mildly dilated. Right Atrium: Right atrial size was normal in size. Pericardium: There is no evidence of pericardial effusion. Mitral Valve: The mitral valve is normal in structure. Normal mobility of the mitral valve leaflets. Mild mitral valve regurgitation. No evidence of mitral valve stenosis. Tricuspid Valve: The tricuspid valve is normal in structure. Tricuspid valve regurgitation is mild . No evidence of tricuspid stenosis. Aortic Valve: The aortic valve was not well visualized. Aortic valve regurgitation is not visualized. No aortic stenosis is present. Aortic valve peak gradient measures 14.6 mmHg. Pulmonic Valve: The pulmonic valve was normal in structure. Pulmonic valve regurgitation is not visualized. No evidence of pulmonic stenosis. Aorta: The aortic root is normal  in size and structure. There is moderate dilatation of the aortic root. Venous: The inferior vena cava is normal in size with greater than 50% respiratory variability, suggesting right atrial pressure of 3 mmHg. IAS/Shunts: No atrial level shunt detected by color flow Doppler.  LEFT VENTRICLE PLAX 2D LVIDd:         5.83 cm  Diastology LVIDs:         3.74 cm  LV e' lateral:   6.64 cm/s LV PW:         1.57 cm  LV E/e' lateral: 12.5 LV IVS:        1.67 cm  LV e' medial:    5.55 cm/s LVOT diam:     2.50 cm  LV E/e' medial:  14.9 LV SV:         103 LV SV Index:   45 LVOT Area:     4.91 cm  RIGHT VENTRICLE RV S prime:     16.20 cm/s TAPSE (M-mode): 1.6 cm LEFT ATRIUM             Index       RIGHT ATRIUM           Index LA diam:        4.60 cm 2.00 cm/m  RA Area:     22.80 cm LA Vol (A2C):   71.4 ml 31.05 ml/m RA Volume:   76.40 ml  33.22 ml/m LA Vol (A4C):   68.5 ml 29.79 ml/m LA Biplane Vol: 72.4 ml 31.48 ml/m  AORTIC VALVE                PULMONIC VALVE AV Area (Vmax): 2.65 cm    PV Vmax:       1.13 m/s AV Vmax:        191.00 cm/s PV Peak grad:  5.1 mmHg AV Peak Grad:   14.6 mmHg LVOT Vmax:      103.00 cm/s LVOT Vmean:     75.100 cm/s LVOT VTI:       0.210 m  AORTA Ao Root diam: 4.50 cm Ao Asc diam:  3.70 cm MITRAL VALVE MV  Area (PHT): 3.72 cm    SHUNTS MV Decel Time: 204 msec    Systemic VTI:  0.21 m MV E velocity: 82.90 cm/s  Systemic Diam: 2.50 cm MV A velocity: 60.10 cm/s MV E/A ratio:  1.38 Ida Rogue MD Electronically signed by Ida Rogue MD Signature Date/Time: 09/29/2019/2:33:19 PM    Final      Medications:   . sodium chloride     . amLODipine  10 mg Oral Daily  . aspirin EC  81 mg Oral Daily  . atorvastatin  40 mg Oral q1800  . Chlorhexidine Gluconate Cloth  6 each Topical Q0600  . heparin  5,000 Units Subcutaneous Q8H  . hydrALAZINE  100 mg Oral TID  . irbesartan  75 mg Oral QHS  . isosorbide mononitrate  60 mg Oral Daily  . oxyCODONE  20 mg Oral Q12H  . sodium chloride flush   3 mL Intravenous Q12H  . sodium chloride flush  3 mL Intravenous Q12H  . sodium chloride flush  3 mL Intravenous Q12H  . torsemide  100 mg Oral Daily   sodium chloride, labetalol, morphine injection, nitroGLYCERIN, ondansetron (ZOFRAN) IV, oxyCODONE, sodium chloride flush  Assessment/ Plan:  64 y.o.African American male with end-stage renal disease, previous history of substance abuse, history of pulmonary embolism, depression, three-vessel coronary disease-seen on CT, hepatitis C, emphysema, aortic atherosclerosis   Principal Problem:   Chest pain Active Problems:   Hypertension   Chronic pain   ESRD (end stage renal disease) on dialysis (HCC)   Chronic diastolic CHF (congestive heart failure) (HCC)   History of MI (myocardial infarction)   Anemia of chronic kidney failure, stage 5 ([place])   History of pulmonary embolism  [name]/MWF  #. ESRD Hemodialysis planned for today Continue torsemide   #. Anemia of CKD  Lab Results  Component Value Date   HGB 8.9 (L) 09/27/2019   Low dose EPO with HD  #. SHPTH     Component Value Date/Time   PTH 758 (H) 08/15/2018 1117   Lab Results  Component Value Date   PHOS 7.8 (H) 09/30/2019   Monitor calcium and phos level during this admission Continue Auryxia, sevelamer at  Home dose  #Chest pain, tachycardia during dialysis  CTA neg for PE 09/27/19 Cardiology evaluation in progress. Underwent cardiac cath today.  Proximal RCA 20% Mildly calcified coronary arteries with mild nonobstructive coronary artery disease.  Dilated aortic root. Aggressive medical therapy for nonobstructive coronary disease recommended     LOS: 2 Iasia Forcier Candiss Norse 3/22/20212:40 PM  [place], [place]

## 2019-09-30 NOTE — Plan of Care (Signed)
Declines CHF booklet and review.  Reports daily weight compliance at home.

## 2019-09-30 NOTE — Progress Notes (Signed)
Progress Note  Patient Name: [name] Date of Encounter: 09/30/2019  Primary Cardiologist: No primary care provider on file.  Dr. Lovena Le did the consult  Subjective   Reports improvement in chest pain.  He describes the chest pain as tightness feeling which is intermittent and worse with dialysis.  He reports possibly having cardiac catheterization few years ago in [place] but does not know the details.  Inpatient Medications    Scheduled Meds: . amLODipine  10 mg Oral Daily  . aspirin EC  81 mg Oral Daily  . atorvastatin  40 mg Oral q1800  . Chlorhexidine Gluconate Cloth  6 each Topical Q0600  . heparin  5,000 Units Subcutaneous Q8H  . hydrALAZINE  100 mg Oral TID  . irbesartan  75 mg Oral QHS  . isosorbide mononitrate  60 mg Oral Daily  . oxyCODONE  20 mg Oral Q12H  . sodium chloride flush  3 mL Intravenous Q12H  . sodium chloride flush  3 mL Intravenous Q12H  . torsemide  100 mg Oral Daily   Continuous Infusions: . sodium chloride    . sodium chloride 10 mL/hr at 09/30/19 0805   PRN Meds: sodium chloride, morphine injection, nitroGLYCERIN, ondansetron (ZOFRAN) IV, oxyCODONE, sodium chloride flush   Vital Signs    Vitals:   09/29/19 1759 09/29/19 2023 09/30/19 0213 09/30/19 0813  BP: 129/66 (!) 143/75 132/65 (!) 147/81  Pulse: 67 74 80 73  Resp:   20 18  Temp:  98.7 F (37.1 C) 98.5 F (36.9 C) 97.8 F (36.6 C)  TempSrc:  Oral Oral Oral  SpO2:  98% 93% 99%  Weight:   109.8 kg   Height:        Intake/Output Summary (Last 24 hours) at 09/30/2019 0817 Last data filed at 09/30/2019 0805 Gross per 24 hour  Intake 9.73 ml  Output 0 ml  Net 9.73 ml   Last 3 Weights 09/30/2019 09/29/2019 09/28/2019  Weight (lbs) 242 lb 239 lb 5.7 oz 238 lb 11.2 oz  Weight (kg) 109.77 kg 108.57 kg 108.274 kg      Telemetry    Normal sinus rhythm- Personally Reviewed  ECG    Normal sinus rhythm with incomplete right bundle branch block.  Lateral T wave changes  suggestive of ischemia- Personally Reviewed  Physical Exam   GEN: No acute distress.   Neck: No JVD Cardiac: RRR, no  rubs, or gallops.  2 out of 6 systolic murmur at the aortic area.  Left arm fistula for dialysis Respiratory: Clear to auscultation bilaterally. GI: Soft, nontender, non-distended  MS: No edema; No deformity. Neuro:  Nonfocal  Psych: Normal affect   Labs    High Sensitivity Troponin:   Recent Labs  Lab 09/27/19 2150 09/28/19 0018  TROPONINIHS 68* 62*      Chemistry Recent Labs  Lab 09/27/19 2150  NA 141  K 3.6  CL 102  CO2 31  GLUCOSE 121*  BUN 29*  CREATININE 5.90*  CALCIUM 8.1*  GFRNONAA 9*  GFRAA 11*  ANIONGAP 8     Hematology Recent Labs  Lab 09/27/19 2150  WBC 4.9  RBC 2.94*  HGB 8.9*  HCT 26.7*  MCV 90.8  MCH 30.3  MCHC 33.3  RDW 13.5  PLT 120*    BNP Recent Labs  Lab 09/27/19 2150  BNP >4,500.0*     DDimer No results for input(s): DDIMER in the last 168 hours.   Radiology    ECHOCARDIOGRAM COMPLETE  Result  Date: 09/29/2019    ECHOCARDIOGRAM REPORT   Patient Name:   RHEA THRUN Date of Exam: 09/29/2019 Medical Rec #:  [id number]         Height:       72.0 in Accession #:    [id number]        Weight:       239.4 lb Date of Birth:  1955-10-28         BSA:          2.300 m Patient Age:    64 years          BP:           150/90 mmHg Patient Gender: M                 HR:           68 bpm. Exam Location:  [facility] Procedure: 2D Echo Indications:     Chest Pain 786.50/ R07.9  History:         Patient has prior history of Echocardiogram examinations, most                  recent 08/20/2018.  Sonographer:     Arville Go RDCS Referring Phys:  [id number] Athena Masse Diagnosing Phys: Ida Rogue MD  Sonographer Comments: Technically difficult study due to poor echo windows. IMPRESSIONS  1. Left ventricular ejection fraction, by estimation, is 55 to 60%. The left ventricle has normal function. The left ventricle has no regional wall  motion abnormalities. There is moderate left ventricular hypertrophy. Left ventricular diastolic parameters are consistent with Grade II diastolic dysfunction (pseudonormalization).  2. Right ventricular systolic function is normal. The right ventricular size is normal.  3. Left atrial size was mildly dilated.  4. The aortic valve was not well visualized, unable to exclude bicuspid aortic valve.  5. There is dilatation of the aortic root, 4.5 cm. FINDINGS  Left Ventricle: Left ventricular ejection fraction, by estimation, is 55 to 60%. The left ventricle has normal function. The left ventricle has no regional wall motion abnormalities. The left ventricular internal cavity size was normal in size. There is  moderate left ventricular hypertrophy. Left ventricular diastolic parameters are consistent with Grade II diastolic dysfunction (pseudonormalization). Right Ventricle: The right ventricular size is normal. No increase in right ventricular wall thickness. Right ventricular systolic function is normal. Left Atrium: Left atrial size was mildly dilated. Right Atrium: Right atrial size was normal in size. Pericardium: There is no evidence of pericardial effusion. Mitral Valve: The mitral valve is normal in structure. Normal mobility of the mitral valve leaflets. Mild mitral valve regurgitation. No evidence of mitral valve stenosis. Tricuspid Valve: The tricuspid valve is normal in structure. Tricuspid valve regurgitation is mild . No evidence of tricuspid stenosis. Aortic Valve: The aortic valve was not well visualized. Aortic valve regurgitation is not visualized. No aortic stenosis is present. Aortic valve peak gradient measures 14.6 mmHg. Pulmonic Valve: The pulmonic valve was normal in structure. Pulmonic valve regurgitation is not visualized. No evidence of pulmonic stenosis. Aorta: The aortic root is normal in size and structure. There is moderate dilatation of the aortic root. Venous: The inferior vena cava is  normal in size with greater than 50% respiratory variability, suggesting right atrial pressure of 3 mmHg. IAS/Shunts: No atrial level shunt detected by color flow Doppler.  LEFT VENTRICLE PLAX 2D LVIDd:         5.83 cm  Diastology LVIDs:  3.74 cm  LV e' lateral:   6.64 cm/s LV PW:         1.57 cm  LV E/e' lateral: 12.5 LV IVS:        1.67 cm  LV e' medial:    5.55 cm/s LVOT diam:     2.50 cm  LV E/e' medial:  14.9 LV SV:         103 LV SV Index:   45 LVOT Area:     4.91 cm  RIGHT VENTRICLE RV S prime:     16.20 cm/s TAPSE (M-mode): 1.6 cm LEFT ATRIUM             Index       RIGHT ATRIUM           Index LA diam:        4.60 cm 2.00 cm/m  RA Area:     22.80 cm LA Vol (A2C):   71.4 ml 31.05 ml/m RA Volume:   76.40 ml  33.22 ml/m LA Vol (A4C):   68.5 ml 29.79 ml/m LA Biplane Vol: 72.4 ml 31.48 ml/m  AORTIC VALVE                PULMONIC VALVE AV Area (Vmax): 2.65 cm    PV Vmax:       1.13 m/s AV Vmax:        191.00 cm/s PV Peak grad:  5.1 mmHg AV Peak Grad:   14.6 mmHg LVOT Vmax:      103.00 cm/s LVOT Vmean:     75.100 cm/s LVOT VTI:       0.210 m  AORTA Ao Root diam: 4.50 cm Ao Asc diam:  3.70 cm MITRAL VALVE MV Area (PHT): 3.72 cm    SHUNTS MV Decel Time: 204 msec    Systemic VTI:  0.21 m MV E velocity: 82.90 cm/s  Systemic Diam: 2.50 cm MV A velocity: 60.10 cm/s MV E/A ratio:  1.38 Ida Rogue MD Electronically signed by Ida Rogue MD Signature Date/Time: 09/29/2019/2:33:19 PM    Final     Cardiac Studies   Echocardiogram was done yesterday and was personally reviewed by me.  It showed an EF of 55 to 60% with no obvious wall motion abnormalities.  Aortic valve appeared mildly calcified with moderately dilated aortic root at 4.5 cm.  The ascending aorta was only mildly dilated.  Bicuspid aortic valve could not be completely excluded.  Patient Profile     64 y.o. male with a hx of chronic atypical CP, HLD, HTN, HFpEF, history of PE on Eliquis with poor compliance, CAD by CT, ESRD on HD,  thrombocytopenia, h/o substance abuse, current smoker, and who presented with chest pain.  Assessment & Plan    1.  Chest pain with minimally elevated troponin in the setting of end-stage renal disease on hemodialysis.  Possible unstable angina with multiple risk factors for coronary artery disease and three-vessel coronary artery calcifications noted on CT scan.  Given his symptoms and risk factors, the patient is scheduled for left heart catheterization possible PCI today.  I discussed the procedure in details as well as risks and benefits.  Further recommendations to follow after cardiac cath.  2.  History of pulmonary embolism: He reports that he stopped taking Eliquis due to excessive bleeding with dialysis.  3.  Essential hypertension: Blood pressure is more controlled now.  4.  Hyperlipidemia: On atorvastatin 40 mg daily.  5.  End-stage renal disease on hemodialysis Monday Wednesday  Friday.       For questions or updates, please contact [place] Please consult [email or web address] for contact info under        Signed, Kathlyn Sacramento, MD  09/30/2019, 8:17 AM

## 2019-09-30 NOTE — Discharge Summary (Addendum)
Physician Discharge Summary  [name] NWG:[id number] DOB: 12/22/1955 DOA: 09/27/2019  PCP: Patient, No Pcp Per  Admit date: 09/27/2019 Discharge date: 09/30/2019  Discharge disposition: Home   Recommendations for Outpatient Follow-Up:   Outpatient follow up with PCP in 1 week     Discharge Diagnosis:   Principal Problem:   Chest pain Active Problems:   Hypertension   Chronic pain   ESRD (end stage renal disease) on dialysis (HCC)   Chronic diastolic CHF (congestive heart failure) (HCC)   History of MI (myocardial infarction)   Anemia of chronic kidney failure, stage 5 ([place])   History of pulmonary embolism    Discharge Condition: Stable.  Diet recommendation: Renal diet  Code status: Full code.    Hospital Course:   [name] is a 64 y.o. malewith medical history significant forESRD on HD MWF, CAD with history of MI but no PCI, history of PE nonadherent with Eliquis, h/o stroke, chronic diastolic heart failure, anemia of chronic disease and chronic painon chronic opioids. He presented to the emergency room after he developed chest pain during dialysis causing the session to be cut short by 1 hour.  He was admitted to the hospital for further work-up.  CTA was negative for PE.  Troponins were mildly elevated but flat.  CTA showed triple-vessel disease.  He was seen in consultation by the cardiologist and he underwent left heart cath but this showed nonobstructive coronary artery disease.  Chest pain was thought to be noncardiac. He was also seen by the nephrologist for hemodialysis during this admission.  He has a history of PE x2.  He said he is agreeable to close Eliquis for viable because of excessive bleeding from dialysis access during hemodialysis.  Risks versus benefits of long-term anticoagulation given history of 2 episodes of bleeding in the past were discussed.  Patient elected to continue with long-term anticoagulation with Eliquis.  He  said he was taking baby aspirin at home but he was advised to avoid the low-dose aspirin since he will be taking the Eliquis.  His chest pain has improved and is deemed stable for discharge to home.     Discharge Exam:   Vitals:   09/30/19 1630 09/30/19 1708  BP: 139/85 (!) 161/83  Pulse: 75 79  Resp: 14 18  Temp: 98.4 F (36.9 C) 98.5 F (36.9 C)  SpO2:  98%   Vitals:   09/30/19 1600 09/30/19 1615 09/30/19 1630 09/30/19 1708  BP: 126/81 136/82 139/85 (!) 161/83  Pulse: 64 76 75 79  Resp: 14 13 14 18   Temp:   98.4 F (36.9 C) 98.5 F (36.9 C)  TempSrc:   Oral Oral  SpO2:    98%  Weight:      Height:         GEN: NAD SKIN: No rash EYES: EOMI ENT: MMM CV: RRR PULM: CTA B ABD: soft, ND, NT, +BS CNS: AAO x 3, chronic left facial droop, otherwise non focal EXT: No edema or tenderness   The results of significant diagnostics from this hospitalization (including imaging, microbiology, ancillary and laboratory) are listed below for reference.     Procedures and Diagnostic Studies:   CARDIAC CATHETERIZATION  Result Date: 09/30/2019  Prox RCA lesion is 20% stenosed.  Prox RCA to Mid RCA lesion is 30% stenosed.  1.  Mildly calcified coronary arteries with mild nonobstructive coronary artery disease.  Difficult coronary angiography of the left system due to large coronary arteries with  dilated aortic root.  Left coronary system was engaged with a JL 5 catheter. 2.  Left ventricular angiography was not performed.  EF was normal by echo.  Normal left ventricular end-diastolic pressure.  No significant gradient across the aortic valve. Recommendations: The chest pain seems to be noncardiac.  Recommend aggressive medical therapy for nonobstructive coronary artery disease. No further recommendations from our standpoint and the patient can be discharged home later today after dialysis if no other issues.   ECHOCARDIOGRAM COMPLETE  Result Date: 09/29/2019    ECHOCARDIOGRAM  REPORT   Patient Name:   [name] Date of Exam: 09/29/2019 Medical Rec #:  [id number]         Height:       72.0 in Accession #:    [id number]        Weight:       239.4 lb Date of Birth:  05/16/1956         BSA:          2.300 m Patient Age:    82 years          BP:           150/90 mmHg Patient Gender: M                 HR:           68 bpm. Exam Location:  [facility] Procedure: 2D Echo Indications:     Chest Pain 786.50/ R07.9  History:         Patient has prior history of Echocardiogram examinations, most                  recent 08/20/2018.  Sonographer:     Arville Go RDCS Referring Phys:  [id number] Athena Masse Diagnosing Phys: Ida Rogue MD  Sonographer Comments: Technically difficult study due to poor echo windows. IMPRESSIONS  1. Left ventricular ejection fraction, by estimation, is 55 to 60%. The left ventricle has normal function. The left ventricle has no regional wall motion abnormalities. There is moderate left ventricular hypertrophy. Left ventricular diastolic parameters are consistent with Grade II diastolic dysfunction (pseudonormalization).  2. Right ventricular systolic function is normal. The right ventricular size is normal.  3. Left atrial size was mildly dilated.  4. The aortic valve was not well visualized, unable to exclude bicuspid aortic valve.  5. There is dilatation of the aortic root, 4.5 cm. FINDINGS  Left Ventricle: Left ventricular ejection fraction, by estimation, is 55 to 60%. The left ventricle has normal function. The left ventricle has no regional wall motion abnormalities. The left ventricular internal cavity size was normal in size. There is  moderate left ventricular hypertrophy. Left ventricular diastolic parameters are consistent with Grade II diastolic dysfunction (pseudonormalization). Right Ventricle: The right ventricular size is normal. No increase in right ventricular wall thickness. Right ventricular systolic function is normal. Left Atrium: Left atrial  size was mildly dilated. Right Atrium: Right atrial size was normal in size. Pericardium: There is no evidence of pericardial effusion. Mitral Valve: The mitral valve is normal in structure. Normal mobility of the mitral valve leaflets. Mild mitral valve regurgitation. No evidence of mitral valve stenosis. Tricuspid Valve: The tricuspid valve is normal in structure. Tricuspid valve regurgitation is mild . No evidence of tricuspid stenosis. Aortic Valve: The aortic valve was not well visualized. Aortic valve regurgitation is not visualized. No aortic stenosis is present. Aortic valve peak gradient measures 14.6 mmHg. Pulmonic Valve: The pulmonic valve  was normal in structure. Pulmonic valve regurgitation is not visualized. No evidence of pulmonic stenosis. Aorta: The aortic root is normal in size and structure. There is moderate dilatation of the aortic root. Venous: The inferior vena cava is normal in size with greater than 50% respiratory variability, suggesting right atrial pressure of 3 mmHg. IAS/Shunts: No atrial level shunt detected by color flow Doppler.  LEFT VENTRICLE PLAX 2D LVIDd:         5.83 cm  Diastology LVIDs:         3.74 cm  LV e' lateral:   6.64 cm/s LV PW:         1.57 cm  LV E/e' lateral: 12.5 LV IVS:        1.67 cm  LV e' medial:    5.55 cm/s LVOT diam:     2.50 cm  LV E/e' medial:  14.9 LV SV:         103 LV SV Index:   45 LVOT Area:     4.91 cm  RIGHT VENTRICLE RV S prime:     16.20 cm/s TAPSE (M-mode): 1.6 cm LEFT ATRIUM             Index       RIGHT ATRIUM           Index LA diam:        4.60 cm 2.00 cm/m  RA Area:     22.80 cm LA Vol (A2C):   71.4 ml 31.05 ml/m RA Volume:   76.40 ml  33.22 ml/m LA Vol (A4C):   68.5 ml 29.79 ml/m LA Biplane Vol: 72.4 ml 31.48 ml/m  AORTIC VALVE                PULMONIC VALVE AV Area (Vmax): 2.65 cm    PV Vmax:       1.13 m/s AV Vmax:        191.00 cm/s PV Peak grad:  5.1 mmHg AV Peak Grad:   14.6 mmHg LVOT Vmax:      103.00 cm/s LVOT Vmean:     75.100  cm/s LVOT VTI:       0.210 m  AORTA Ao Root diam: 4.50 cm Ao Asc diam:  3.70 cm MITRAL VALVE MV Area (PHT): 3.72 cm    SHUNTS MV Decel Time: 204 msec    Systemic VTI:  0.21 m MV E velocity: 82.90 cm/s  Systemic Diam: 2.50 cm MV A velocity: 60.10 cm/s MV E/A ratio:  1.38 Ida Rogue MD Electronically signed by Ida Rogue MD Signature Date/Time: 09/29/2019/2:33:19 PM    Final      Labs:   Basic Metabolic Panel: Recent Labs  Lab 09/27/19 2150 09/28/19 0018 09/30/19 0435  NA 141  --   --   K 3.6  --   --   CL 102  --   --   CO2 31  --   --   GLUCOSE 121*  --   --   BUN 29*  --   --   CREATININE 5.90*  --   --   CALCIUM 8.1*  --   --   MG  --  2.1  --   PHOS  --   --  7.8*   GFR Estimated Creatinine Clearance: 16.8 mL/min (A) (by C-G formula based on SCr of 5.9 mg/dL (H)). Liver Function Tests: No results for input(s): AST, ALT, ALKPHOS, BILITOT, PROT, ALBUMIN in the last 168 hours. No results for input(s):  LIPASE, AMYLASE in the last 168 hours. No results for input(s): AMMONIA in the last 168 hours. Coagulation profile No results for input(s): INR, PROTIME in the last 168 hours.  CBC: Recent Labs  Lab 09/27/19 2150  WBC 4.9  NEUTROABS 3.1  HGB 8.9*  HCT 26.7*  MCV 90.8  PLT 120*   Cardiac Enzymes: No results for input(s): CKTOTAL, CKMB, CKMBINDEX, TROPONINI in the last 168 hours. BNP: Invalid input(s): POCBNP CBG: No results for input(s): GLUCAP in the last 168 hours. D-Dimer No results for input(s): DDIMER in the last 72 hours. Hgb A1c Recent Labs    09/29/19 0456  HGBA1C 5.0   Lipid Profile Recent Labs    09/28/19 0018  CHOL 135  HDL 48  LDLCALC 78  TRIG 46  CHOLHDL 2.8   Thyroid function studies Recent Labs    09/28/19 0018  TSH 0.947   Anemia work up No results for input(s): VITAMINB12, FOLATE, FERRITIN, TIBC, IRON, RETICCTPCT in the last 72 hours. Microbiology Recent Results (from the past 240 hour(s))  SARS CORONAVIRUS 2 (TAT 6-24  HRS) Nasopharyngeal Nasopharyngeal Swab     Status: None   Collection Time: 09/28/19  1:42 AM   Specimen: Nasopharyngeal Swab  Result Value Ref Range Status   SARS Coronavirus 2 NEGATIVE NEGATIVE Final    Comment: (NOTE) SARS-CoV-2 target nucleic acids are NOT DETECTED. The SARS-CoV-2 RNA is generally detectable in upper and lower respiratory specimens during the acute phase of infection. Negative results do not preclude SARS-CoV-2 infection, do not rule out co-infections with other pathogens, and should not be used as the sole basis for treatment or other patient management decisions. Negative results must be combined with clinical observations, patient history, and epidemiological information. The expected result is Negative. Fact Sheet for Patients: SugarRoll.be Fact Sheet for Healthcare Providers: https://www.woods-mathews.com/ This test is not yet approved or cleared by the [place] FDA and  has been authorized for detection and/or diagnosis of SARS-CoV-2 by FDA under an Emergency Use Authorization (EUA). This EUA will remain  in effect (meaning this test can be used) for the duration of the COVID-19 declaration under Section 56 4(b)(1) of the Act, 21 U.S.C. section 360bbb-3(b)(1), unless the authorization is terminated or revoked sooner. Performed at [facility], [place] [address] [place], [place] [address]   MRSA PCR Screening     Status: None   Collection Time: 09/28/19  4:47 AM   Specimen: Nasal Mucosa; Nasopharyngeal  Result Value Ref Range Status   MRSA by PCR NEGATIVE NEGATIVE Final    Comment:        The GeneXpert MRSA Assay (FDA approved for NASAL specimens only), is one component of a comprehensive MRSA colonization surveillance program. It is not intended to diagnose MRSA infection nor to guide or monitor treatment for MRSA infections. Performed at [facility], [address], [place], [place]  [address]      Discharge Instructions:   Discharge Instructions    Diet renal 60/70-08-12-1198   Complete by: As directed    Increase activity slowly   Complete by: As directed      Allergies as of 09/30/2019      Reactions   Cyclobenzaprine Nausea Only   Clonidine Other (See Comments)   Asymptomatic bradycardia to 38   Furosemide Other (See Comments)   Caused renal failure   Tylenol [acetaminophen] Other (See Comments)   Reaction:  Pt states it bothers his liver      Medication List  TAKE these medications   albuterol 108 (90 Base) MCG/ACT inhaler Commonly known as: VENTOLIN HFA Inhale 2 puffs into the lungs every 6 (six) hours as needed for wheezing or shortness of breath.   amLODipine 10 MG tablet Commonly known as: NORVASC Take 1 tablet (10 mg total) by mouth daily.   apixaban 2.5 MG Tabs tablet Commonly known as: Eliquis Take 1 tablet (2.5 mg total) by mouth 2 (two) times daily.   atorvastatin 40 MG tablet Commonly known as: LIPITOR Take 1 tablet (40 mg total) by mouth at bedtime.   Auryxia 1 GM 210 MG(Fe) tablet Generic drug: ferric citrate Take 210 mg by mouth See admin instructions. Take 1 tablet (210mg ) by mouth three times daily with meals and take 1 tablet (210mg ) by mouth with snacks   hydrALAZINE 100 MG tablet Commonly known as: APRESOLINE Take 1 tablet (100 mg total) by mouth 3 (three) times daily.   irbesartan 75 MG tablet Commonly known as: AVAPRO Take 1 tablet (75 mg total) by mouth at bedtime.   isosorbide mononitrate 60 MG 24 hr tablet Commonly known as: IMDUR Take 1 tablet (60 mg total) by mouth daily.   ondansetron 4 MG tablet Commonly known as: ZOFRAN Take 1 tablet (4 mg total) by mouth every 6 (six) hours as needed for nausea.   OxyCONTIN 20 mg 12 hr tablet Generic drug: oxyCODONE Take 20 mg by mouth every 12 (twelve) hours.   oxyCODONE 5 MG immediate release tablet Commonly known as: Oxy IR/ROXICODONE Take 5-10 mg by mouth every  6 (six) hours as needed for severe pain.   polyethylene glycol 17 g packet Commonly known as: MIRALAX / GLYCOLAX Take 17 g by mouth daily as needed for mild constipation.   sevelamer carbonate 800 MG tablet Commonly known as: RENVELA Take 1 tablet (800 mg total) by mouth 3 (three) times daily with meals.   tamsulosin 0.4 MG Caps capsule Commonly known as: FLOMAX Take 1 capsule (0.4 mg total) by mouth daily.   torsemide 100 MG tablet Commonly known as: DEMADEX Take 1 tablet (100 mg total) by mouth daily. Start taking on: October 01, 2019         Time coordinating discharge: 28 minutes  Signed:  Jennye Boroughs  [facility] 09/30/2019, 5:20 PM

## 2019-09-30 NOTE — Progress Notes (Signed)
This note also relates to the following rows which could not be included: Pulse Rate - Cannot attach notes to unvalidated device data Resp - Cannot attach notes to unvalidated device data SpO2 - Cannot attach notes to unvalidated device data  Hd started  

## 2019-10-18 ENCOUNTER — Emergency Department: Payer: Medicare Other

## 2019-10-18 ENCOUNTER — Encounter: Payer: Self-pay | Admitting: Emergency Medicine

## 2019-10-18 ENCOUNTER — Other Ambulatory Visit: Payer: Self-pay

## 2019-10-18 ENCOUNTER — Emergency Department
Admission: EM | Admit: 2019-10-18 | Discharge: 2019-10-18 | Disposition: A | Discharge status: Home / Self Care | Payer: Medicare Other | Attending: Emergency Medicine | Admitting: Emergency Medicine

## 2019-10-18 DIAGNOSIS — I5032 Chronic diastolic (congestive) heart failure: Secondary | ICD-10-CM | POA: Insufficient documentation

## 2019-10-18 DIAGNOSIS — R195 Other fecal abnormalities: Secondary | ICD-10-CM | POA: Insufficient documentation

## 2019-10-18 DIAGNOSIS — F17210 Nicotine dependence, cigarettes, uncomplicated: Secondary | ICD-10-CM | POA: Insufficient documentation

## 2019-10-18 DIAGNOSIS — Z86711 Personal history of pulmonary embolism: Secondary | ICD-10-CM | POA: Insufficient documentation

## 2019-10-18 DIAGNOSIS — N186 End stage renal disease: Secondary | ICD-10-CM | POA: Diagnosis not present

## 2019-10-18 DIAGNOSIS — Z7901 Long term (current) use of anticoagulants: Secondary | ICD-10-CM | POA: Diagnosis not present

## 2019-10-18 DIAGNOSIS — Z992 Dependence on renal dialysis: Secondary | ICD-10-CM | POA: Diagnosis not present

## 2019-10-18 DIAGNOSIS — I132 Hypertensive heart and chronic kidney disease with heart failure and with stage 5 chronic kidney disease, or end stage renal disease: Secondary | ICD-10-CM | POA: Diagnosis not present

## 2019-10-18 DIAGNOSIS — Z79899 Other long term (current) drug therapy: Secondary | ICD-10-CM | POA: Insufficient documentation

## 2019-10-18 DIAGNOSIS — R079 Chest pain, unspecified: Secondary | ICD-10-CM

## 2019-10-18 DIAGNOSIS — R0789 Other chest pain: Secondary | ICD-10-CM | POA: Insufficient documentation

## 2019-10-18 LAB — BASIC METABOLIC PANEL
Anion gap: 9 (ref 5–15)
BUN: 27 mg/dL — ABNORMAL HIGH (ref 8–23)
CO2: 27 mmol/L (ref 22–32)
Calcium: 8.1 mg/dL — ABNORMAL LOW (ref 8.9–10.3)
Chloride: 101 mmol/L (ref 98–111)
Creatinine, Ser: 6.85 mg/dL — ABNORMAL HIGH (ref 0.61–1.24)
GFR calc Af Amer: 9 mL/min — ABNORMAL LOW (ref >60)
GFR calc non Af Amer: 8 mL/min — ABNORMAL LOW (ref >60)
Glucose, Bld: 93 mg/dL (ref 70–99)
Potassium: 2.9 mmol/L — ABNORMAL LOW (ref 3.5–5.1)
Sodium: 137 mmol/L (ref 135–145)

## 2019-10-18 LAB — CBC
HCT: 29 % — ABNORMAL LOW (ref 39.0–52.0)
Hemoglobin: 9.6 g/dL — ABNORMAL LOW (ref 13.0–17.0)
MCH: 30 pg (ref 26.0–34.0)
MCHC: 33.1 g/dL (ref 30.0–36.0)
MCV: 90.6 fL (ref 80.0–100.0)
Platelets: 115 10*3/uL — ABNORMAL LOW (ref 150–400)
RBC: 3.2 MIL/uL — ABNORMAL LOW (ref 4.22–5.81)
RDW: 14.3 % (ref 11.5–15.5)
WBC: 6.8 10*3/uL (ref 4.0–10.5)
nRBC: 0 % (ref 0.0–0.2)

## 2019-10-18 LAB — HEPATIC FUNCTION PANEL
ALT: 12 U/L (ref 0–44)
AST: 17 U/L (ref 15–41)
Albumin: 3.5 g/dL (ref 3.5–5.0)
Alkaline Phosphatase: 54 U/L (ref 38–126)
Bilirubin, Direct: 0.2 mg/dL (ref 0.0–0.2)
Indirect Bilirubin: 0.6 mg/dL (ref 0.3–0.9)
Total Bilirubin: 0.8 mg/dL (ref 0.3–1.2)
Total Protein: 6.4 g/dL — ABNORMAL LOW (ref 6.5–8.1)

## 2019-10-18 LAB — LIPASE, BLOOD: Lipase: 32 U/L (ref 11–51)

## 2019-10-18 LAB — TROPONIN I (HIGH SENSITIVITY)
Troponin I (High Sensitivity): 150 ng/L (ref <18)
Troponin I (High Sensitivity): 132 ng/L (ref <18)

## 2019-10-18 MED ORDER — IOHEXOL 350 MG/ML SOLN: 75.0000 mL | Freq: Once | INTRAVENOUS | Status: DC | PRN

## 2019-10-18 MED ORDER — LIDOCAINE VISCOUS HCL 2 % MT SOLN
15.0000 mL | Freq: Once | OROMUCOSAL | Status: AC
  Administered 2019-10-18: 15 mL via OROMUCOSAL
  Filled 2019-10-18: qty 15

## 2019-10-18 MED ORDER — NITROGLYCERIN 0.4 MG SL SUBL
0.4000 mg | SUBLINGUAL_TABLET | SUBLINGUAL | Status: DC | PRN
  Filled 2019-10-18: qty 1

## 2019-10-18 MED ORDER — SODIUM CHLORIDE 0.9% FLUSH: 3.0000 mL | Freq: Once | INTRAVENOUS | Status: DC

## 2019-10-18 NOTE — ED Notes (Signed)
Dr Dene Gentry aware of pt's elevated trop result and ordered to repeat the ekg

## 2019-10-18 NOTE — ED Notes (Signed)
Pt was in a hurry to leave because his ride was here; left without signing but he acknowledged receipt of discharge paperwork.  Pt removed his IV.  This RN applied gauze and tape prior to pt leaving.

## 2019-10-18 NOTE — ED Notes (Signed)
Pt reporting CP and bloody stool for the past few days. Denies N/V, reports some SHOB.  This RN attempted to give SL nitro for current CP, pt states "I can't take this shit, it gives me headaches". Dr. Archie Balboa notified.

## 2019-10-18 NOTE — ED Provider Notes (Signed)
[facility] Emergency Department Provider Note  ____________________________________________   I have reviewed the triage vital signs and the nursing notes.   HISTORY  Chief Complaint Chest Pain   History limited by: Not Limited   HPI [name] is a 64 y.o. male who presents to the emergency department today because of concern for chest pain. It started during dialysis. Located in the center chest. Was described as being pressure like. Radiated to his back. The patient also is complaining of noticing blood in his stool over the past couple of days. States he has been taking his eliquis.   Records reviewed. Per medical record review patient has a history of recent admission for chest pain. Had cardiac cath done roughly 2 and a half weeks ago with no intervention performed.   Past Medical History:  Diagnosis Date  . Depression   . Heart attack ([place])   . Hypertension   . PE (pulmonary embolism)   . Renal disorder     Patient Active Problem List   Diagnosis Date Noted  . Chronic diastolic CHF (congestive heart failure) ([place]) 09/28/2019  . History of MI (myocardial infarction) 09/28/2019  . Anemia of chronic kidney failure, stage 5 ([place]) 09/28/2019  . History of pulmonary embolism 09/28/2019  . Chronic midline low back pain   . Acute respiratory failure with hypoxia ([place])   . Acute diastolic CHF (congestive heart failure) ([place])   . Anemia of chronic disease   . Thrombocytopenia ([place])   . Congestive heart failure ([place])   . Fluid overload 06/21/2019  . Volume overload 11/15/2018  . ESRD (end stage renal disease) on dialysis ([place]) 11/14/2018  . HCAP (healthcare-associated pneumonia) 08/27/2018  . Dyspnea 08/25/2018  . Chest pain 08/20/2018  . ESRD on dialysis ([place]) 11/08/2016  . Prostate cancer screening 11/08/2016  . Acquired cyst of kidney 11/08/2016  . Urinary urgency 11/08/2016  . Acute on chronic renal failure ([place]) 09/23/2016  .  Hypertensive urgency 09/21/2016  . Dysthymia 02/26/2016  . Chronic pain 02/26/2016  . Noncompliance with renal dialysis ([place]) 02/26/2016  . Opiate abuse, continuous ([place]) 02/03/2016  . Substance induced mood disorder ([place]) 02/03/2016  . Antisocial personality disorder ([place]) 02/03/2016  . Left-sided weakness 01/18/2016  . HEPATITIS C 06/28/2008  . HLD (hyperlipidemia) 06/28/2008  . SUBSTANCE ABUSE, MULTIPLE 06/28/2008  . Hypertension 06/27/2008    Past Surgical History:  Procedure Laterality Date  . LEFT HEART CATH AND CORONARY ANGIOGRAPHY N/A 09/30/2019   Procedure: LEFT HEART CATH AND CORONARY ANGIOGRAPHY;  Surgeon: Wellington Hampshire, MD;  Location: [facility];  Service: Cardiovascular;  Laterality: N/A;  . TOTAL HIP ARTHROPLASTY      Prior to Admission medications   Medication Sig Start Date End Date Taking? Authorizing Provider  albuterol (VENTOLIN HFA) 108 (90 Base) MCG/ACT inhaler Inhale 2 puffs into the lungs every 6 (six) hours as needed for wheezing or shortness of breath. 06/23/19   Jennye Boroughs, MD  amLODipine (NORVASC) 10 MG tablet Take 1 tablet (10 mg total) by mouth daily. 06/23/19 06/22/20  Jennye Boroughs, MD  apixaban (ELIQUIS) 2.5 MG TABS tablet Take 1 tablet (2.5 mg total) by mouth 2 (two) times daily. 06/23/19   Jennye Boroughs, MD  atorvastatin (LIPITOR) 40 MG tablet Take 1 tablet (40 mg total) by mouth at bedtime. 06/23/19 06/22/20  Jennye Boroughs, MD  AURYXIA 1 GM 210 MG(Fe) tablet Take 210 mg by mouth See admin instructions. Take 1 tablet (210mg ) by mouth three  times daily with meals and take 1 tablet (210mg ) by mouth with snacks 06/28/19   [provider]  hydrALAZINE (APRESOLINE) 100 MG tablet Take 1 tablet (100 mg total) by mouth 3 (three) times daily. 06/23/19   Jennye Boroughs, MD  irbesartan (AVAPRO) 75 MG tablet Take 1 tablet (75 mg total) by mouth at bedtime. 06/23/19   Jennye Boroughs, MD  isosorbide mononitrate (IMDUR) 60 MG 24 hr tablet  Take 1 tablet (60 mg total) by mouth daily. Patient not taking: Reported on 09/28/2019 06/23/19   Jennye Boroughs, MD  ondansetron (ZOFRAN) 4 MG tablet Take 1 tablet (4 mg total) by mouth every 6 (six) hours as needed for nausea. 07/08/19   Loletha Grayer, MD  oxyCODONE (OXY IR/ROXICODONE) 5 MG immediate release tablet Take 5-10 mg by mouth every 6 (six) hours as needed for severe pain.    [provider]  oxyCODONE (OXYCONTIN) 20 mg 12 hr tablet Take 20 mg by mouth every 12 (twelve) hours.    [provider]  polyethylene glycol (MIRALAX / GLYCOLAX) 17 g packet Take 17 g by mouth daily as needed for mild constipation. 07/08/19   Loletha Grayer, MD  sevelamer carbonate (RENVELA) 800 MG tablet Take 1 tablet (800 mg total) by mouth 3 (three) times daily with meals. 06/23/19   Jennye Boroughs, MD  tamsulosin (FLOMAX) 0.4 MG CAPS capsule Take 1 capsule (0.4 mg total) by mouth daily. 09/27/16   Gladstone Lighter, MD  torsemide (DEMADEX) 100 MG tablet Take 1 tablet (100 mg total) by mouth daily. 10/01/19   Jennye Boroughs, MD    Allergies Cyclobenzaprine, Clonidine, Furosemide, and Tylenol [acetaminophen]  No family history on file.  Social History Social History   Tobacco Use  . Smoking status: Current Every Day Smoker    Packs/day: 0.50    Types: Cigarettes  . Smokeless tobacco: Never Used  Substance Use Topics  . Alcohol use: No  . Drug use: No    Comment: pt has been positive for cocaine twice this week but denies use    Review of Systems Constitutional: No fever/chills Eyes: No visual changes. ENT: No sore throat. Cardiovascular: Positive for chest pain. Respiratory: Denies shortness of breath. Gastrointestinal: No abdominal pain.  No nausea, no vomiting.  No diarrhea.   Genitourinary: Negative for dysuria. Musculoskeletal: Negative for back pain. Skin: Negative for rash. Neurological: Negative for headaches, focal weakness or  numbness.  ____________________________________________   PHYSICAL EXAM:  VITAL SIGNS: ED Triage Vitals  Enc Vitals Group     BP 10/18/19 1439 (!) 157/98     Pulse Rate 10/18/19 1439 75     Resp 10/18/19 1439 16     Temp 10/18/19 1439 98.6 F (37 C)     Temp Source 10/18/19 1439 Oral     SpO2 10/18/19 1439 99 %     Weight 10/18/19 1433 230 lb (104.3 kg)     Height 10/18/19 1433 6' (1.829 m)     Head Circumference --      Peak Flow --      Pain Score 10/18/19 1433 9   Constitutional: Alert and oriented.  Eyes: Conjunctivae are normal.  ENT      Head: Normocephalic and atraumatic.      Nose: No congestion/rhinnorhea.      Mouth/Throat: Mucous membranes are moist.      Neck: No stridor. Hematological/Lymphatic/Immunilogical: No cervical lymphadenopathy. Cardiovascular: Normal rate, regular rhythm.  No murmurs, rubs, or gallops. Dialysis access still accessed in left  upper arm. Respiratory: Normal respiratory effort without tachypnea nor retractions. Breath sounds are clear and equal bilaterally. No wheezes/rales/rhonchi. Gastrointestinal: Soft and non tender. No rebound. No guarding.  Genitourinary: Deferred Musculoskeletal: Normal range of motion in all extremities. No lower extremity edema.  Neurologic:  Normal speech and language. No gross focal neurologic deficits are appreciated.  Skin:  Skin is warm, dry and intact. No rash noted. Psychiatric: Mood and affect are normal. Speech and behavior are normal. Patient exhibits appropriate insight and judgment.  ____________________________________________    LABS (pertinent positives/negatives)  Trop hs 150 CBC wbc 6.8, hgb 9.6, plt 115 BMP wnl except na 137, k 2.9, glu 93, cr 6.85  ____________________________________________   EKG  I, Nance Pear, attending physician, personally viewed and interpreted this EKG  EKG Time: 1432 Rate: 75 Rhythm: normal sinus rhythm Axis: left axis deviation Intervals: qtc  484 QRS: narrow, LVH ST changes: no st elevation Impression: abnormal ekg  I, Nance Pear, attending physician, personally viewed and interpreted this EKG  EKG Time: 1535 Rate: 91 Rhythm: sinus rhythm Axis: left axis deviation Intervals: qtc 504 QRS: narrow, LVH ST changes: no st elevation Impression: abnormal ekg   ____________________________________________    RADIOLOGY  CXR No acute abnormality  ____________________________________________   PROCEDURES  Procedures  ____________________________________________   INITIAL IMPRESSION / ASSESSMENT AND PLAN / ED COURSE  Pertinent labs & imaging results that were available during my care of the patient were reviewed by me and considered in my medical decision making (see chart for details).   Patient presented to the emergency department today because of concerns for chest pain.  Per chart review the patient has been seen and evaluated for this chest pain multiple times in the past.  Did have recent admission with catheterization done roughly 2-1/2 weeks ago.  Initial troponin was elevated at 150.  Repeat however was improved at 132.  I discussed with Dr. Rayann Heman with cardiology.  Given recent negative catheterization he did not feel any further cardiology work-up is needed at this time.  I did put in an order for the patient to have a CT angio given history of PEs.  Patient however states he is compliant with his Eliquis.  Patient however declined to stay for his CT angio.  He stated he needed to go because his ride was here.  I discussed with patient my concern for blood clot.  ____________________________________________   FINAL CLINICAL IMPRESSION(S) / ED DIAGNOSES  Final diagnoses:  Nonspecific chest pain     Note: This dictation was prepared with Dragon dictation. Any transcriptional errors that result from this process are unintentional     Nance Pear, MD 10/18/19 1931

## 2019-10-18 NOTE — Discharge Instructions (Addendum)
Please seek medical attention for any high fevers, chest pain, shortness of breath, change in behavior, persistent vomiting, bloody stool or any other new or concerning symptoms.  

## 2019-10-18 NOTE — ED Notes (Signed)
Pt currently sitting outside smoking a cigarette

## 2019-10-18 NOTE — ED Triage Notes (Signed)
Pt to ED via [facility] from dialysis center for chest pain. Pt states that he was about 2 hours into treatment when he started having chest pain. Pain is located in the center of his chest, denies radiation of pain. Pt states that the pain feels like pressure. Pt denies any other symptoms. Pt is in NAD. Pt states that he is passing blood in his stool. Pt states that this has been going on for a few days.

## 2019-10-18 NOTE — ED Notes (Signed)
Nitro wasted with [employer]

## 2019-10-18 NOTE — ED Notes (Signed)
Contacted dialysis d/t pt still having fistula on left upper arm still accessed. Dialysis states it will be approx 2 hours before they can come de-access.

## 2019-10-18 NOTE — ED Notes (Signed)
Xray called back about pt having soiled himself with stool and needing wipes, and paper scrubs

## 2019-10-18 NOTE — ED Notes (Signed)
Repeat ekg done and shown to Dr Dene Gentry

## 2019-10-18 NOTE — ED Notes (Signed)
Pt fistula still accessed from dialysis.

## 2019-10-23 ENCOUNTER — Emergency Department: Payer: Medicare Other

## 2019-10-23 ENCOUNTER — Other Ambulatory Visit: Payer: Self-pay

## 2019-10-23 ENCOUNTER — Encounter: Payer: Self-pay | Admitting: Emergency Medicine

## 2019-10-23 DIAGNOSIS — I5032 Chronic diastolic (congestive) heart failure: Secondary | ICD-10-CM | POA: Insufficient documentation

## 2019-10-23 DIAGNOSIS — I132 Hypertensive heart and chronic kidney disease with heart failure and with stage 5 chronic kidney disease, or end stage renal disease: Secondary | ICD-10-CM | POA: Diagnosis not present

## 2019-10-23 DIAGNOSIS — Z886 Allergy status to analgesic agent status: Secondary | ICD-10-CM | POA: Insufficient documentation

## 2019-10-23 DIAGNOSIS — R7989 Other specified abnormal findings of blood chemistry: Secondary | ICD-10-CM | POA: Insufficient documentation

## 2019-10-23 DIAGNOSIS — Z86711 Personal history of pulmonary embolism: Secondary | ICD-10-CM | POA: Diagnosis not present

## 2019-10-23 DIAGNOSIS — G8929 Other chronic pain: Secondary | ICD-10-CM | POA: Insufficient documentation

## 2019-10-23 DIAGNOSIS — Z7901 Long term (current) use of anticoagulants: Secondary | ICD-10-CM | POA: Diagnosis not present

## 2019-10-23 DIAGNOSIS — D631 Anemia in chronic kidney disease: Secondary | ICD-10-CM | POA: Diagnosis not present

## 2019-10-23 DIAGNOSIS — R079 Chest pain, unspecified: Secondary | ICD-10-CM | POA: Diagnosis present

## 2019-10-23 DIAGNOSIS — I2510 Atherosclerotic heart disease of native coronary artery without angina pectoris: Secondary | ICD-10-CM | POA: Diagnosis not present

## 2019-10-23 DIAGNOSIS — Z888 Allergy status to other drugs, medicaments and biological substances status: Secondary | ICD-10-CM | POA: Diagnosis not present

## 2019-10-23 DIAGNOSIS — Z992 Dependence on renal dialysis: Secondary | ICD-10-CM | POA: Insufficient documentation

## 2019-10-23 DIAGNOSIS — I252 Old myocardial infarction: Secondary | ICD-10-CM | POA: Insufficient documentation

## 2019-10-23 DIAGNOSIS — Z79899 Other long term (current) drug therapy: Secondary | ICD-10-CM | POA: Diagnosis not present

## 2019-10-23 DIAGNOSIS — E785 Hyperlipidemia, unspecified: Secondary | ICD-10-CM | POA: Insufficient documentation

## 2019-10-23 DIAGNOSIS — R918 Other nonspecific abnormal finding of lung field: Secondary | ICD-10-CM | POA: Diagnosis not present

## 2019-10-23 DIAGNOSIS — E876 Hypokalemia: Secondary | ICD-10-CM | POA: Insufficient documentation

## 2019-10-23 DIAGNOSIS — N186 End stage renal disease: Secondary | ICD-10-CM | POA: Diagnosis not present

## 2019-10-23 DIAGNOSIS — R0789 Other chest pain: Secondary | ICD-10-CM | POA: Diagnosis not present

## 2019-10-23 DIAGNOSIS — F17210 Nicotine dependence, cigarettes, uncomplicated: Secondary | ICD-10-CM | POA: Diagnosis not present

## 2019-10-23 DIAGNOSIS — Z20822 Contact with and (suspected) exposure to covid-19: Secondary | ICD-10-CM | POA: Diagnosis not present

## 2019-10-23 LAB — COMPREHENSIVE METABOLIC PANEL
ALT: 13 U/L (ref 0–44)
AST: 15 U/L (ref 15–41)
Albumin: 3.4 g/dL — ABNORMAL LOW (ref 3.5–5.0)
Alkaline Phosphatase: 49 U/L (ref 38–126)
Anion gap: 11 (ref 5–15)
BUN: 23 mg/dL (ref 8–23)
CO2: 28 mmol/L (ref 22–32)
Calcium: 8.4 mg/dL — ABNORMAL LOW (ref 8.9–10.3)
Chloride: 101 mmol/L (ref 98–111)
Creatinine, Ser: 5.28 mg/dL — ABNORMAL HIGH (ref 0.61–1.24)
GFR calc Af Amer: 13 mL/min — ABNORMAL LOW (ref >60)
GFR calc non Af Amer: 11 mL/min — ABNORMAL LOW (ref >60)
Glucose, Bld: 112 mg/dL — ABNORMAL HIGH (ref 70–99)
Potassium: 2.9 mmol/L — ABNORMAL LOW (ref 3.5–5.1)
Sodium: 140 mmol/L (ref 135–145)
Total Bilirubin: 0.8 mg/dL (ref 0.3–1.2)
Total Protein: 6.2 g/dL — ABNORMAL LOW (ref 6.5–8.1)

## 2019-10-23 LAB — CBC
HCT: 27 % — ABNORMAL LOW (ref 39.0–52.0)
Hemoglobin: 9.3 g/dL — ABNORMAL LOW (ref 13.0–17.0)
MCH: 30.7 pg (ref 26.0–34.0)
MCHC: 34.4 g/dL (ref 30.0–36.0)
MCV: 89.1 fL (ref 80.0–100.0)
Platelets: 105 10*3/uL — ABNORMAL LOW (ref 150–400)
RBC: 3.03 MIL/uL — ABNORMAL LOW (ref 4.22–5.81)
RDW: 14.6 % (ref 11.5–15.5)
WBC: 5.8 10*3/uL (ref 4.0–10.5)
nRBC: 0 % (ref 0.0–0.2)

## 2019-10-23 LAB — TROPONIN I (HIGH SENSITIVITY): Troponin I (High Sensitivity): 96 ng/L — ABNORMAL HIGH (ref <18)

## 2019-10-23 NOTE — ED Triage Notes (Signed)
Pt to triage via w/c with no distress noted, mask in place; pt reports mid CP radiating into back today accomp by [facility]; dialysis today

## 2019-10-24 ENCOUNTER — Observation Stay
Admission: EM | Admit: 2019-10-24 | Discharge: 2019-10-24 | Disposition: A | Discharge status: Home / Self Care | Payer: Medicare Other | Attending: Internal Medicine | Admitting: Internal Medicine

## 2019-10-24 ENCOUNTER — Encounter: Payer: Self-pay | Admitting: Internal Medicine

## 2019-10-24 DIAGNOSIS — D638 Anemia in other chronic diseases classified elsewhere: Secondary | ICD-10-CM | POA: Diagnosis present

## 2019-10-24 DIAGNOSIS — G894 Chronic pain syndrome: Secondary | ICD-10-CM

## 2019-10-24 DIAGNOSIS — R079 Chest pain, unspecified: Secondary | ICD-10-CM | POA: Diagnosis not present

## 2019-10-24 DIAGNOSIS — I5032 Chronic diastolic (congestive) heart failure: Secondary | ICD-10-CM | POA: Diagnosis present

## 2019-10-24 DIAGNOSIS — I252 Old myocardial infarction: Secondary | ICD-10-CM

## 2019-10-24 DIAGNOSIS — F1911 Other psychoactive substance abuse, in remission: Secondary | ICD-10-CM | POA: Diagnosis not present

## 2019-10-24 DIAGNOSIS — R778 Other specified abnormalities of plasma proteins: Secondary | ICD-10-CM

## 2019-10-24 DIAGNOSIS — Z992 Dependence on renal dialysis: Secondary | ICD-10-CM

## 2019-10-24 DIAGNOSIS — R0789 Other chest pain: Secondary | ICD-10-CM

## 2019-10-24 DIAGNOSIS — N186 End stage renal disease: Secondary | ICD-10-CM

## 2019-10-24 DIAGNOSIS — R52 Pain, unspecified: Secondary | ICD-10-CM

## 2019-10-24 DIAGNOSIS — Z86711 Personal history of pulmonary embolism: Secondary | ICD-10-CM | POA: Diagnosis present

## 2019-10-24 HISTORY — DX: Thoracic aortic ectasia: I77.810

## 2019-10-24 HISTORY — DX: Atherosclerotic heart disease of native coronary artery without angina pectoris: I25.10

## 2019-10-24 HISTORY — DX: Other specified abnormal findings of blood chemistry: R79.89

## 2019-10-24 HISTORY — DX: Other ill-defined heart diseases: I51.89

## 2019-10-24 HISTORY — DX: End stage renal disease: N18.6

## 2019-10-24 HISTORY — DX: Other specified abnormalities of plasma proteins: R77.8

## 2019-10-24 LAB — BASIC METABOLIC PANEL
Anion gap: 11 (ref 5–15)
BUN: 29 mg/dL — ABNORMAL HIGH (ref 8–23)
CO2: 28 mmol/L (ref 22–32)
Calcium: 8.7 mg/dL — ABNORMAL LOW (ref 8.9–10.3)
Chloride: 102 mmol/L (ref 98–111)
Creatinine, Ser: 5.95 mg/dL — ABNORMAL HIGH (ref 0.61–1.24)
GFR calc Af Amer: 11 mL/min — ABNORMAL LOW (ref >60)
GFR calc non Af Amer: 9 mL/min — ABNORMAL LOW (ref >60)
Glucose, Bld: 97 mg/dL (ref 70–99)
Potassium: 3.1 mmol/L — ABNORMAL LOW (ref 3.5–5.1)
Sodium: 141 mmol/L (ref 135–145)

## 2019-10-24 LAB — CBC
HCT: 27.8 % — ABNORMAL LOW (ref 39.0–52.0)
Hemoglobin: 9.4 g/dL — ABNORMAL LOW (ref 13.0–17.0)
MCH: 30.4 pg (ref 26.0–34.0)
MCHC: 33.8 g/dL (ref 30.0–36.0)
MCV: 90 fL (ref 80.0–100.0)
Platelets: 100 10*3/uL — ABNORMAL LOW (ref 150–400)
RBC: 3.09 MIL/uL — ABNORMAL LOW (ref 4.22–5.81)
RDW: 14.6 % (ref 11.5–15.5)
WBC: 6.2 10*3/uL (ref 4.0–10.5)
nRBC: 0 % (ref 0.0–0.2)

## 2019-10-24 LAB — SARS CORONAVIRUS 2 (TAT 6-24 HRS): SARS Coronavirus 2: NEGATIVE

## 2019-10-24 LAB — TROPONIN I (HIGH SENSITIVITY)
Troponin I (High Sensitivity): 110 ng/L (ref <18)
Troponin I (High Sensitivity): 100 ng/L (ref <18)

## 2019-10-24 MED ORDER — OXYCODONE HCL ER 10 MG PO T12A
20.0000 mg | EXTENDED_RELEASE_TABLET | ORAL | Status: AC
  Administered 2019-10-24: 20 mg via ORAL
  Filled 2019-10-24: qty 2

## 2019-10-24 MED ORDER — OXYCODONE HCL 5 MG PO TABS
10.0000 mg | ORAL_TABLET | ORAL | Status: AC
  Administered 2019-10-24: 10 mg via ORAL
  Filled 2019-10-24: qty 2

## 2019-10-24 MED ORDER — ONDANSETRON HCL 4 MG PO TABS: 4.0000 mg | ORAL_TABLET | Freq: Four times a day (QID) | ORAL | 0 refills | Status: DC | PRN

## 2019-10-24 MED ORDER — MORPHINE SULFATE (PF) 4 MG/ML IV SOLN
4.0000 mg | Freq: Once | INTRAVENOUS | Status: AC
  Administered 2019-10-24: 4 mg via INTRAVENOUS
  Filled 2019-10-24: qty 1

## 2019-10-24 MED ORDER — ATORVASTATIN CALCIUM 20 MG PO TABS: 20.0000 mg | ORAL_TABLET | Freq: Every day | ORAL | Status: DC

## 2019-10-24 MED ORDER — ASPIRIN EC 81 MG PO TBEC: 81.0000 mg | DELAYED_RELEASE_TABLET | Freq: Every day | ORAL | Status: DC

## 2019-10-24 MED ORDER — ASPIRIN 300 MG RE SUPP: 300.0000 mg | RECTAL | Status: DC

## 2019-10-24 MED ORDER — ATORVASTATIN CALCIUM 40 MG PO TABS: 40.0000 mg | ORAL_TABLET | Freq: Every day | ORAL | 0 refills | Status: DC

## 2019-10-24 MED ORDER — ALPRAZOLAM 0.5 MG PO TABS: 0.2500 mg | ORAL_TABLET | Freq: Two times a day (BID) | ORAL | Status: DC | PRN

## 2019-10-24 MED ORDER — IRBESARTAN 75 MG PO TABS: 75.0000 mg | ORAL_TABLET | Freq: Every day | ORAL | 1 refills | Status: DC

## 2019-10-24 MED ORDER — ACETAMINOPHEN 325 MG PO TABS: 650.0000 mg | ORAL_TABLET | ORAL | Status: DC | PRN

## 2019-10-24 MED ORDER — ASPIRIN 81 MG PO CHEW
324.0000 mg | CHEWABLE_TABLET | Freq: Once | ORAL | Status: DC
  Filled 2019-10-24: qty 4

## 2019-10-24 MED ORDER — PANTOPRAZOLE SODIUM 40 MG PO TBEC: 40.0000 mg | DELAYED_RELEASE_TABLET | Freq: Every day | ORAL | 0 refills | Status: DC

## 2019-10-24 MED ORDER — ASPIRIN 81 MG PO CHEW: 324.0000 mg | CHEWABLE_TABLET | ORAL | Status: DC

## 2019-10-24 MED ORDER — ISOSORBIDE MONONITRATE ER 60 MG PO TB24: 60.0000 mg | ORAL_TABLET | Freq: Every day | ORAL | 1 refills | Status: DC

## 2019-10-24 MED ORDER — ONDANSETRON HCL 4 MG/2ML IJ SOLN: 4.0000 mg | Freq: Four times a day (QID) | INTRAMUSCULAR | Status: DC | PRN

## 2019-10-24 MED ORDER — NITROGLYCERIN 0.4 MG SL SUBL: 0.4000 mg | SUBLINGUAL_TABLET | SUBLINGUAL | Status: DC | PRN

## 2019-10-24 NOTE — ED Notes (Signed)
Pt states he took asa 81 mg x 3 prior to coming in

## 2019-10-24 NOTE — ED Notes (Signed)
Pt requesting breakfast, explained that awaiting breakfast tray from cafeteria. Pt also updated that this RN informed rounding MD of his requesting for his PRN home oxycodone. Pt continues to refuse the tylenol and nitro ordered.

## 2019-10-24 NOTE — ED Notes (Signed)
Cardiology at bedside.

## 2019-10-24 NOTE — ED Notes (Signed)
Pt in with co mid chest pain x 2 days, states in epigastric area and radiates up chest and to back. Pt states worse when he lays down. Describes pain as pressure, denies hx of the same. Pt states he has had a cough recently, no fever. Pain worse with coughing. Pt states does feel shob at times with some diaphoresis. No distress noted at this time, monitor in charge.

## 2019-10-24 NOTE — ED Provider Notes (Signed)
[facility] Emergency Department Provider Note  ____________________________________________   First MD Initiated Contact with Patient 10/24/19 [phone]     (approximate)  I have reviewed the triage vital signs and the nursing notes.   HISTORY  Chief Complaint Chest Pain    HPI [name] is a 64 y.o. male with below list of previous medical conditions including MI pulmonary emboli end-stage renal disease with dialysis Monday Wednesday Friday last dialysis yesterday and polysubstance abuse including cocaine presents to the emergency department secondary to central chest pain at the patient states radiates to his back with associated dyspnea which began tonight.  Patient denies any cough no fever.  Patient denies any nausea vomiting or diarrhea.  Patient denies any heart palpitations.  Patient denies any lower extremity pain or swelling.  Patient's heart catheterization that was performed on 09/30/2019 revealed proximal RCA 20% stenosis and a proximal to mid RCA 30% stenosis.  Patient states that he has not used cocaine in "months".        Past Medical History:  Diagnosis Date  . Depression   . Heart attack ([place])   . Hypertension   . PE (pulmonary embolism)   . Renal disorder     Patient Active Problem List   Diagnosis Date Noted  . Chronic diastolic CHF (congestive heart failure) ([place]) 09/28/2019  . History of MI (myocardial infarction) 09/28/2019  . Anemia of chronic kidney failure, stage 5 ([place]) 09/28/2019  . History of pulmonary embolism 09/28/2019  . Chronic midline low back pain   . Acute respiratory failure with hypoxia ([place])   . Acute diastolic CHF (congestive heart failure) ([place])   . Anemia of chronic disease   . Thrombocytopenia ([place])   . Congestive heart failure ([place])   . Fluid overload 06/21/2019  . Volume overload 11/15/2018  . ESRD (end stage renal disease) on dialysis ([place]) 11/14/2018  . HCAP (healthcare-associated pneumonia)  08/27/2018  . Dyspnea 08/25/2018  . Chest pain 08/20/2018  . ESRD on dialysis ([place]) 11/08/2016  . Prostate cancer screening 11/08/2016  . Acquired cyst of kidney 11/08/2016  . Urinary urgency 11/08/2016  . Acute on chronic renal failure ([place]) 09/23/2016  . Hypertensive urgency 09/21/2016  . Dysthymia 02/26/2016  . Chronic pain 02/26/2016  . Noncompliance with renal dialysis ([place]) 02/26/2016  . Opiate abuse, continuous ([place]) 02/03/2016  . Substance induced mood disorder ([place]) 02/03/2016  . Antisocial personality disorder ([place]) 02/03/2016  . Left-sided weakness 01/18/2016  . HEPATITIS C 06/28/2008  . HLD (hyperlipidemia) 06/28/2008  . SUBSTANCE ABUSE, MULTIPLE 06/28/2008  . Hypertension 06/27/2008    Past Surgical History:  Procedure Laterality Date  . LEFT HEART CATH AND CORONARY ANGIOGRAPHY N/A 09/30/2019   Procedure: LEFT HEART CATH AND CORONARY ANGIOGRAPHY;  Surgeon: Wellington Hampshire, MD;  Location: [facility];  Service: Cardiovascular;  Laterality: N/A;  . TOTAL HIP ARTHROPLASTY      Prior to Admission medications   Medication Sig Start Date End Date Taking? Authorizing Provider  albuterol (VENTOLIN HFA) 108 (90 Base) MCG/ACT inhaler Inhale 2 puffs into the lungs every 6 (six) hours as needed for wheezing or shortness of breath. 06/23/19   Jennye Boroughs, MD  amLODipine (NORVASC) 10 MG tablet Take 1 tablet (10 mg total) by mouth daily. 06/23/19 06/22/20  Jennye Boroughs, MD  apixaban (ELIQUIS) 2.5 MG TABS tablet Take 1 tablet (2.5 mg total) by mouth 2 (two) times daily. 06/23/19   Jennye Boroughs, MD  atorvastatin (LIPITOR) 40 MG  tablet Take 1 tablet (40 mg total) by mouth at bedtime. 06/23/19 06/22/20  Jennye Boroughs, MD  AURYXIA 1 GM 210 MG(Fe) tablet Take 210 mg by mouth See admin instructions. Take 1 tablet (210mg ) by mouth three times daily with meals and take 1 tablet (210mg ) by mouth with snacks 06/28/19   [provider]  hydrALAZINE (APRESOLINE) 100 MG  tablet Take 1 tablet (100 mg total) by mouth 3 (three) times daily. 06/23/19   Jennye Boroughs, MD  irbesartan (AVAPRO) 75 MG tablet Take 1 tablet (75 mg total) by mouth at bedtime. 06/23/19   Jennye Boroughs, MD  isosorbide mononitrate (IMDUR) 60 MG 24 hr tablet Take 1 tablet (60 mg total) by mouth daily. Patient not taking: Reported on 09/28/2019 06/23/19   Jennye Boroughs, MD  ondansetron (ZOFRAN) 4 MG tablet Take 1 tablet (4 mg total) by mouth every 6 (six) hours as needed for nausea. 07/08/19   Loletha Grayer, MD  oxyCODONE (OXY IR/ROXICODONE) 5 MG immediate release tablet Take 5-10 mg by mouth every 6 (six) hours as needed for severe pain.    [provider]  oxyCODONE (OXYCONTIN) 20 mg 12 hr tablet Take 20 mg by mouth every 12 (twelve) hours.    [provider]  polyethylene glycol (MIRALAX / GLYCOLAX) 17 g packet Take 17 g by mouth daily as needed for mild constipation. 07/08/19   Loletha Grayer, MD  sevelamer carbonate (RENVELA) 800 MG tablet Take 1 tablet (800 mg total) by mouth 3 (three) times daily with meals. 06/23/19   Jennye Boroughs, MD  tamsulosin (FLOMAX) 0.4 MG CAPS capsule Take 1 capsule (0.4 mg total) by mouth daily. 09/27/16   Gladstone Lighter, MD  torsemide (DEMADEX) 100 MG tablet Take 1 tablet (100 mg total) by mouth daily. 10/01/19   Jennye Boroughs, MD    Allergies Cyclobenzaprine, Clonidine, Furosemide, and Tylenol [acetaminophen]  No family history on file.  Social History Social History   Tobacco Use  . Smoking status: Current Every Day Smoker    Packs/day: 0.50    Types: Cigarettes  . Smokeless tobacco: Never Used  Substance Use Topics  . Alcohol use: No  . Drug use: No    Comment: pt has been positive for cocaine twice this week but denies use    Review of Systems Constitutional: No fever/chills Eyes: No visual changes. ENT: No sore throat. Cardiovascular: Denies chest pain. Respiratory: Denies shortness of breath. Gastrointestinal:  No abdominal pain.  No nausea, no vomiting.  No diarrhea.  No constipation. Genitourinary: Negative for dysuria. Musculoskeletal: Negative for neck pain.  Negative for back pain. Integumentary: Negative for rash. Neurological: Negative for headaches, focal weakness or numbness.   ____________________________________________   PHYSICAL EXAM:  VITAL SIGNS: ED Triage Vitals  Enc Vitals Group     BP 10/23/19 2127 (!) 159/89     Pulse Rate 10/23/19 2127 70     Resp 10/23/19 2127 20     Temp 10/23/19 2127 98.3 F (36.8 C)     Temp Source 10/23/19 2127 Oral     SpO2 10/23/19 2127 100 %     Weight 10/23/19 2124 108.9 kg (240 lb)     Height 10/23/19 2124 1.829 m (6')     Head Circumference --      Peak Flow --      Pain Score 10/23/19 2124 8     Pain Loc --      Pain Edu? --      Excl. in [place]? --  Constitutional: Alert and oriented.  Eyes: Conjunctivae are normal.  Mouth/Throat: Patient is wearing a mask. Neck: No stridor.  No meningeal signs.   Cardiovascular: Normal rate, regular rhythm. Good peripheral circulation. Grossly normal heart sounds. Respiratory: Normal respiratory effort.  No retractions. Gastrointestinal: Soft and nontender. No distention.  Musculoskeletal: No lower extremity tenderness nor edema. No gross deformities of extremities. Neurologic:  Normal speech and language. No gross focal neurologic deficits are appreciated.  Skin:  Skin is warm, dry and intact. Psychiatric: Mood and affect are normal. Speech and behavior are normal.  ____________________________________________   LABS (all labs ordered are listed, but only abnormal results are displayed)  Labs Reviewed  CBC - Abnormal; Notable for the following components:      Result Value   RBC 3.03 (*)    Hemoglobin 9.3 (*)    HCT 27.0 (*)    Platelets 105 (*)    All other components within normal limits  COMPREHENSIVE METABOLIC PANEL - Abnormal; Notable for the following components:   Potassium  2.9 (*)    Glucose, Bld 112 (*)    Creatinine, Ser 5.28 (*)    Calcium 8.4 (*)    Total Protein 6.2 (*)    Albumin 3.4 (*)    GFR calc non Af Amer 11 (*)    GFR calc Af Amer 13 (*)    All other components within normal limits  TROPONIN I (HIGH SENSITIVITY) - Abnormal; Notable for the following components:   Troponin I (High Sensitivity) 96 (*)    All other components within normal limits  TROPONIN I (HIGH SENSITIVITY) - Abnormal; Notable for the following components:   Troponin I (High Sensitivity) 110 (*)    All other components within normal limits   ____________________________________________  EKG  ED ECG REPORT I, Ben [place]on N Drucella Karbowski, the attending physician, personally viewed and interpreted this ECG.   Date: 10/23/2019  EKG Time: 9:28 PM  Rate: 86  Rhythm: Normal sinus rhythm  Axis: Normal  Intervals: Normal  ST&T Change: None  ____________________________________________  RADIOLOGY I, [place] N Remy Voiles, personally viewed and evaluated these images (plain radiographs) as part of my medical decision making, as well as reviewing the written report by the radiologist.  ED MD interpretation: Mild interstitial edema noted on chest x-ray per radiologist.  Official radiology report(s): DG Chest 2 View  Result Date: 10/23/2019 CLINICAL DATA:  Mid chest pain radiating into back, shortness of breath, end-stage renal disease EXAM: CHEST - 2 VIEW COMPARISON:  10/18/2019 FINDINGS: Frontal and lateral views of the chest demonstrates stable enlargement of the cardiac silhouette and ectasia of the thoracic aorta. Lung volumes are slightly diminished, with crowding of the central vasculature. Mild increased interstitial prominence is noted, favor mild interstitial edema. No airspace disease, effusion, or pneumothorax. IMPRESSION: 1. Mild interstitial edema. Electronically Signed   By: Randa Ngo M.D.   On: 10/23/2019 21:55     _______ Procedures   ____________________________________________   INITIAL IMPRESSION / MDM / [place] / ED COURSE  As part of my medical decision making, I reviewed the following data within the electronic MEDICAL RECORD NUMBER   64 year old male presented with above-stated history and physical exam secondary to central chest pain.  A differential diagnosis including but not limited to ACS pulmonary emboli.  I reviewed the patient's catheterization from last month that showed no considerable stenosis patient had 2 areas of stenosis in the RCA that were 30% each.  However patient admits to 10 out of  10 ongoing chest pain.  Initial troponin was 96 and repeat 110.  Consider possibility of vasospasm as the etiology for the patient's chest pain and troponin increase.  Also considered a possibility of cocaine induced chest pain however patient states that he has not used cocaine in months.  Patient discussed with hospitalist for admission further evaluation and management. ____________________________________________  FINAL CLINICAL IMPRESSION(S) / ED DIAGNOSES  Final diagnoses:  Chest pain, unspecified type     MEDICATIONS GIVEN DURING THIS VISIT:  Medications  aspirin chewable tablet 324 mg (has no administration in time range)  morphine 4 MG/ML injection 4 mg (has no administration in time range)     ED Discharge Orders    None      *Please note:  CHARLIE SEDA was evaluated in Emergency Department on 10/24/2019 for the symptoms described in the history of present illness. He was evaluated in the context of the global COVID-19 pandemic, which necessitated consideration that the patient might be at risk for infection with the SARS-CoV-2 virus that causes COVID-19. Institutional protocols and algorithms that pertain to the evaluation of patients at risk for COVID-19 are in a state of rapid change based on information released by regulatory bodies including the CDC and  federal and state organizations. These policies and algorithms were followed during the patient's care in the ED.  Some ED evaluations and interventions may be delayed as a result of limited staffing during the pandemic.*  Note:  This document was prepared using Dragon voice recognition software and may include unintentional dictation errors.   Gregor Hams, MD 10/24/19 [id number]

## 2019-10-24 NOTE — Consult Note (Addendum)
Cardiology Consult    Patient ID: [name] MRN: [id number], DOB/AGE: 1955/07/23   Admit date: 10/24/2019 Date of Consult: 10/24/2019  Primary Physician: [name] Primary Cardiologist: Nicholas Sacramento, MD Requesting Provider: V. Manuella Ghazi, MD  Patient Profile    [name] is a 64 y.o. male with a history of ESRD on HD, PE prev on [place], substance abuse (cocaine/tobacco), chest pain w/ minimal nonobs CAD on cath 09/30/2019, persistent HsTrop elevation, HTN, dilated aortic root (4.5cm), and depression who is being seen today for the evaluation of chest pain at the request of Dr. Manuella Hodge.  Past Medical History   Past Medical History:  Diagnosis Date  . Depression   . Diastolic dysfunction    a. 09/2019 Echo: EF 55-60%, no rwma, mod LVH, gr2 DD. Nl RV size/fxn. Mildly dil LA. Ao root 4.5cm.  . Dilated aortic root ([place])    a. 09/2019 Echo: Ao root 4.5cm.  . Elevated troponin level not due myocardial infarction   . ESRD (end stage renal disease) ([place])   . Hypertension   . Nonobstructive Coronary Artery Disease    a. 09/2019 Cath: LM nl, LAD min irregs, D1 nl, D2 min irregs, LCX large, min irregs, RCA large, 20p, 30p/m.  . PE (pulmonary embolism)     Past Surgical History:  Procedure Laterality Date  . LEFT HEART CATH AND CORONARY ANGIOGRAPHY N/A 09/30/2019   Procedure: LEFT HEART CATH AND CORONARY ANGIOGRAPHY;  Surgeon: Nicholas Hampshire, MD;  Location: [facility];  Service: Cardiovascular;  Laterality: N/A;  . TOTAL HIP ARTHROPLASTY       Allergies  Allergies  Allergen Reactions  . Cyclobenzaprine Nausea Only  . Clonidine Other (See Comments)    Asymptomatic bradycardia to 38  . Furosemide Other (See Comments)    Caused renal failure  . Tylenol [Acetaminophen] Other (See Comments)    Reaction:  Pt states it bothers his liver    History of Present Illness    64 y/o ? with the above PMH including chest pain and persistently elevated HsTrop,  nonobstructive CAD, HTN, diastolic dysfxn, ESRD on HD, polysubstance abuse, prior PE (no longer on [place]), and depression.  He has had multiple ED evals 2/2 chest pain and Trop elevation.dating back to at least 2017.  He was most recently admitted in late March 2021 with chest pain and troponin elevation.  Diagnostic catheterization was performed on March 22 revealing minimal, nonobstructive CAD.  He was subsequently discharged   seen back in the emergency department on April 9 for ongoing chest pain with mild troponin elevation.  Given recent catheterization, it was not felt that further ischemic evaluation was warranted.  [name] return to the emergency department on April 15 secondary to retrosternal chest pain radiating to his neck and back, similar to prior presentations.  Once again, high-sensitivity troponin was mildly elevated at 96 but with a flat trend of 110  100. EKG unchanged  Long discussion with him, reports he is from [place], has numerous joint issues such as his hip requiring use of pain medications Chest is tender on palpation, worse when he rolls over  Inpatient Medications    . aspirin  324 mg Oral NOW   Or  . aspirin  300 mg Rectal NOW  . [START ON 10/25/2019] aspirin EC  81 mg Oral Daily  . atorvastatin  20 mg Oral q1800    Family History    No family history on file. has no  family status information on file.    Social History    Social History   Socioeconomic History  . Marital status: Divorced    Spouse name: Not on file  . Number of children: Not on file  . Years of education: Not on file  . Highest education level: Not on file  Occupational History  . Not on file  Tobacco Use  . Smoking status: Current Every Day Smoker    Packs/day: 0.50    Types: Cigarettes  . Smokeless tobacco: Never Used  Substance and Sexual Activity  . Alcohol use: No  . Drug use: No    Comment: pt has been positive for cocaine twice this week but denies use  . Sexual  activity: Not on file  Other Topics Concern  . Not on file  Social History Narrative  . Not on file   Social Determinants of Health   Financial Resource Strain:   . Difficulty of Paying Living Expenses:   Food Insecurity:   . Worried About Charity fundraiser in the Last Year:   . Arboriculturist in the Last Year:   Transportation Needs:   . Film/video editor (Medical):   Marland Kitchen Lack of Transportation (Non-Medical):   Physical Activity:   . Days of Exercise Hodge Week:   . Minutes of Exercise Hodge Session:   Stress:   . Feeling of Stress :   Social Connections:   . Frequency of Communication with Friends and Family:   . Frequency of Social Gatherings with Friends and Family:   . Attends Religious Services:   . Active Member of Clubs or Organizations:   . Attends Archivist Meetings:   Marland Kitchen Marital Status:   Intimate Partner Violence:   . Fear of Current or Ex-Partner:   . Emotionally Abused:   Marland Kitchen Physically Abused:   . Sexually Abused:      Review of Systems    General:  No chills, fever, night sweats or weight changes.  Cardiovascular:  + Chest pain Denies dyspnea on exertion, edema, orthopnea, palpitations, paroxysmal nocturnal dyspnea. Dermatological: No rash, lesions/masses Respiratory: No cough, dyspnea Urologic: No hematuria, dysuria Abdominal:   No nausea, vomiting, diarrhea, bright red blood Hodge rectum, melena, or hematemesis Musculoskeletal: Has hip pain Neurologic:  No visual changes, wkns, changes in mental status. All other systems reviewed and are otherwise negative except as noted above.  Physical Exam    Blood pressure (!) 147/83, pulse 82, temperature 98.6 F (37 C), temperature source Oral, resp. rate 18, height 6' (1.829 m), weight 108.9 kg, SpO2 98 %.  General: Pleasant, NAD Psych: Normal affect. Neuro: Alert and oriented X 3. Moves all extremities spontaneously. HEENT: Normal  Neck: Supple without bruits or JVD. Lungs:  Resp regular and  unlabored, CTA. Heart: RRR no s3, s4, or murmurs. Abdomen: Soft, non-tender, non-distended, BS + x 4.  Extremities: No clubbing, cyanosis or edema. DP/PT/Radials 2+ and equal bilaterally.  Labs    Cardiac Enzymes Recent Labs  Lab 09/28/19 0018 10/18/19 1440 10/18/19 1649 10/23/19 2133 10/24/19 0201  TROPONINIHS 62* 150* 132* 96* 110*      Lab Results  Component Value Date   WBC 6.2 10/24/2019   HGB 9.4 (L) 10/24/2019   HCT 27.8 (L) 10/24/2019   MCV 90.0 10/24/2019   PLT 100 (L) 10/24/2019    Recent Labs  Lab 10/23/19 2133 10/23/19 2133 10/24/19 0515  NA 140   < > 141  K 2.9*   < >  3.1*  CL 101   < > 102  CO2 28   < > 28  BUN 23   < > 29*  CREATININE 5.28*   < > 5.95*  CALCIUM 8.4*   < > 8.7*  PROT 6.2*  --   --   BILITOT 0.8  --   --   ALKPHOS 49  --   --   ALT 13  --   --   AST 15  --   --   GLUCOSE 112*   < > 97   < > = values in this interval not displayed.   Lab Results  Component Value Date   CHOL 135 09/28/2019   HDL 48 09/28/2019   LDLCALC 78 09/28/2019   TRIG 46 09/28/2019   Lab Results  Component Value Date   DDIMER (H) 06/18/2008    0.61        AT THE INHOUSE ESTABLISHED CUTOFF VALUE OF 0.48 ug/mL FEU, THIS ASSAY HAS BEEN DOCUMENTED IN THE LITERATURE TO HAVE     Radiology Studies    DG Chest 2 View  Result Date: 10/23/2019 CLINICAL DATA:  Mid chest pain radiating into back, shortness of breath, end-stage renal disease EXAM: CHEST - 2 VIEW COMPARISON:  10/18/2019 FINDINGS: Frontal and lateral views of the chest demonstrates stable enlargement of the cardiac silhouette and ectasia of the thoracic aorta. Lung volumes are slightly diminished, with crowding of the central vasculature. Mild increased interstitial prominence is noted, favor mild interstitial edema. No airspace disease, effusion, or pneumothorax. IMPRESSION: 1. Mild interstitial edema. Electronically Signed   By: Randa Ngo M.D.   On: 10/23/2019 21:55   DG Chest 2  View  Result Date: 10/18/2019 CLINICAL DATA:  Chest pain, began about 2 hours into dialysis treatment, pain located at center of chest, pressure like pain, hypertension, smoker EXAM: CHEST - 2 VIEW COMPARISON:  09/27/2019 FINDINGS: Enlargement of cardiac silhouette with pulmonary vascular congestion. Tortuosity of thoracic aorta. Lungs clear. No infiltrate, pleural effusion or pneumothorax. Bowel interposition between liver and diaphragm. No acute osseous findings. IMPRESSION: Enlargement of cardiac silhouette with pulmonary vascular congestion. No definite acute infiltrate. Electronically Signed   By: Lavonia Dana M.D.   On: 10/18/2019 15:29   CT Angio Chest PE W and/or Wo Contrast  Result Date: 09/28/2019 CLINICAL DATA:  Chest pain since dialysis EXAM: CT ANGIOGRAPHY CHEST WITH CONTRAST TECHNIQUE: Multidetector CT imaging of the chest was performed using the standard protocol during bolus administration of intravenous contrast. Multiplanar CT image reconstructions and MIPs were obtained to evaluate the vascular anatomy. CONTRAST:  163mL OMNIPAQUE IOHEXOL 350 MG/ML SOLN COMPARISON:  Radiograph 09/27/2019 FINDINGS: Cardiovascular: Satisfactory opacification the pulmonary arteries. Respiratory motion and cardiac pulsation artifacts limit evaluation beyond the lobar levels. No large central or lobar pulmonary artery emboli are seen. Central pulmonary arteries are upper limits normal caliber. There is mild cardiomegaly. Three-vessel coronary artery disease is seen. No pericardial effusion. Calcifications present on the aortic leaflets. Atherosclerotic plaque within the normal caliber aorta. No periaortic stranding, hemorrhage or discernible acute luminal abnormality. Normal 3 vessel branching of the aortic arch. Proximal great vessels are mildly calcified but otherwise unremarkable. Mediastinum/Nodes: Few borderline enlarged low-attenuation mediastinal and hilar nodes are present for instance a 13 mm subcarinal  node (4/47), a 14 mm precarinal lymph node (4/39), and a 12 mm right hilar node (4/45). No acute abnormality of the trachea or esophagus. Thyroid gland and thoracic inlet are unremarkable. Lungs/Pleura: Centrilobular and paraseptal emphysema with  an apical predominance. Small pulmonary cyst noted in the left costophrenic sulcus, similar to prior. Apical predominant interlobular septal thickening with peribronchovascular cuffing. Atelectatic changes in the lung bases, likely accentuated by imaging during exhalation for the angiographic technique. Upper Abdomen: No acute abnormalities present in the visualized portions of the upper abdomen. Atherosclerotic calcification of the abdominal aorta and branch vessels is noted. Mild lobular thickening of the left adrenal gland is nonspecific. No visible discrete adrenal masses. Musculoskeletal: Minimal discogenic and facet degenerative change in the thoracic spine. Mild cortical thickening and increased attenuation of the right sixth rib, could reflect early pagetoid involvement. No other acute or suspicious osseous lesions. Review of the MIP images confirms the above findings. IMPRESSION: 1. No large central or lobar pulmonary artery emboli. Respiratory motion and cardiac pulsation artifacts limit evaluation beyond the lobar levels. 2. Centrilobular and paraseptal emphysema with an apical predominance. Emphysema (ICD10-J43.9). 3. Septal thickening with peribronchovascular cuffing may reflect a component of mild interstitial edema. 4. Few borderline enlarged low-attenuation mediastinal and hilar nodes are likely reactive. 5. Mild cortical thickening and increased attenuation of the right sixth rib, could reflect early pagetoid involvement. No other acute or suspicious osseous lesions. 6. Three-vessel coronary artery disease. 7.  Aortic Atherosclerosis (ICD10-I70.0). Electronically Signed   By: Lovena Le M.D.   On: 09/28/2019 00:07   CARDIAC CATHETERIZATION  Result  Date: 09/30/2019  Prox RCA lesion is 20% stenosed.  Prox RCA to Mid RCA lesion is 30% stenosed.  1.  Mildly calcified coronary arteries with mild nonobstructive coronary artery disease.  Difficult coronary angiography of the left system due to large coronary arteries with dilated aortic root.  Left coronary system was engaged with a JL 5 catheter. 2.  Left ventricular angiography was not performed.  EF was normal by echo.  Normal left ventricular end-diastolic pressure.  No significant gradient across the aortic valve. Recommendations: The chest pain seems to be noncardiac.  Recommend aggressive medical therapy for nonobstructive coronary artery disease. No further recommendations from our standpoint and the patient can be discharged home later today after dialysis if no other issues.   DG Chest Portable 1 View  Result Date: 09/27/2019 CLINICAL DATA:  Chest pain EXAM: PORTABLE CHEST 1 VIEW COMPARISON:  07/13/2019 FINDINGS: Cardiomegaly with vascular congestion. No confluent opacities or effusions. No acute bony abnormality. IMPRESSION: Cardiomegaly, vascular congestion. Electronically Signed   By: Rolm Baptise M.D.   On: 09/27/2019 22:19   ECHOCARDIOGRAM COMPLETE  Result Date: 09/29/2019    ECHOCARDIOGRAM REPORT   Patient Name:   [name] Date of Exam: 09/29/2019 Medical Rec #:  [id number]         Height:       72.0 in Accession #:    [id number]        Weight:       239.4 lb Date of Birth:  1955-12-17         BSA:          2.300 m Patient Age:    39 years          BP:           150/90 mmHg Patient Gender: M                 HR:           68 bpm. Exam Location:  [facility] Procedure: 2D Echo Indications:     Chest Pain 786.50/ R07.9  History:  Patient has prior history of Echocardiogram examinations, most                  recent 08/20/2018.  Sonographer:     Arville Go RDCS Referring Phys:  [id number] Athena Masse Diagnosing Phys: Ida Rogue MD  Sonographer Comments: Technically difficult  study due to poor echo windows. IMPRESSIONS  1. Left ventricular ejection fraction, by estimation, is 55 to 60%. The left ventricle has normal function. The left ventricle has no regional wall motion abnormalities. There is moderate left ventricular hypertrophy. Left ventricular diastolic parameters are consistent with Grade II diastolic dysfunction (pseudonormalization).  2. Right ventricular systolic function is normal. The right ventricular size is normal.  3. Left atrial size was mildly dilated.  4. The aortic valve was not well visualized, unable to exclude bicuspid aortic valve.  5. There is dilatation of the aortic root, 4.5 cm. FINDINGS  Left Ventricle: Left ventricular ejection fraction, by estimation, is 55 to 60%. The left ventricle has normal function. The left ventricle has no regional wall motion abnormalities. The left ventricular internal cavity size was normal in size. There is  moderate left ventricular hypertrophy. Left ventricular diastolic parameters are consistent with Grade II diastolic dysfunction (pseudonormalization). Right Ventricle: The right ventricular size is normal. No increase in right ventricular wall thickness. Right ventricular systolic function is normal. Left Atrium: Left atrial size was mildly dilated. Right Atrium: Right atrial size was normal in size. Pericardium: There is no evidence of pericardial effusion. Mitral Valve: The mitral valve is normal in structure. Normal mobility of the mitral valve leaflets. Mild mitral valve regurgitation. No evidence of mitral valve stenosis. Tricuspid Valve: The tricuspid valve is normal in structure. Tricuspid valve regurgitation is mild . No evidence of tricuspid stenosis. Aortic Valve: The aortic valve was not well visualized. Aortic valve regurgitation is not visualized. No aortic stenosis is present. Aortic valve peak gradient measures 14.6 mmHg. Pulmonic Valve: The pulmonic valve was normal in structure. Pulmonic valve regurgitation  is not visualized. No evidence of pulmonic stenosis. Aorta: The aortic root is normal in size and structure. There is moderate dilatation of the aortic root. Venous: The inferior vena cava is normal in size with greater than 50% respiratory variability, suggesting right atrial pressure of 3 mmHg. IAS/Shunts: No atrial level shunt detected by color flow Doppler.  LEFT VENTRICLE PLAX 2D LVIDd:         5.83 cm  Diastology LVIDs:         3.74 cm  LV e' lateral:   6.64 cm/s LV PW:         1.57 cm  LV E/e' lateral: 12.5 LV IVS:        1.67 cm  LV e' medial:    5.55 cm/s LVOT diam:     2.50 cm  LV E/e' medial:  14.9 LV SV:         103 LV SV Index:   45 LVOT Area:     4.91 cm  RIGHT VENTRICLE RV S prime:     16.20 cm/s TAPSE (M-mode): 1.6 cm LEFT ATRIUM             Index       RIGHT ATRIUM           Index LA diam:        4.60 cm 2.00 cm/m  RA Area:     22.80 cm LA Vol (A2C):   71.4 ml 31.05 ml/m RA Volume:  76.40 ml  33.22 ml/m LA Vol (A4C):   68.5 ml 29.79 ml/m LA Biplane Vol: 72.4 ml 31.48 ml/m  AORTIC VALVE                PULMONIC VALVE AV Area (Vmax): 2.65 cm    PV Vmax:       1.13 m/s AV Vmax:        191.00 cm/s PV Peak grad:  5.1 mmHg AV Peak Grad:   14.6 mmHg LVOT Vmax:      103.00 cm/s LVOT Vmean:     75.100 cm/s LVOT VTI:       0.210 m  AORTA Ao Root diam: 4.50 cm Ao Asc diam:  3.70 cm MITRAL VALVE MV Area (PHT): 3.72 cm    SHUNTS MV Decel Time: 204 msec    Systemic VTI:  0.21 m MV E velocity: 82.90 cm/s  Systemic Diam: 2.50 cm MV A velocity: 60.10 cm/s MV E/A ratio:  1.38 Ida Rogue MD Electronically signed by Ida Rogue MD Signature Date/Time: 09/29/2019/2:33:19 PM    Final     ECG & Cardiac Imaging    Normal sinus rhythm with rate 86 bpm LVH nonspecific ST-T wave abnormality 1 and aVL, Unchanged from previous EKGs- personally reviewed.  Assessment & Plan   Atypical chest pain Positional component, hurts to push on it Asking for pain medication, helps his chest and hips Numerous  trips to the emergency room for similar symptoms He has already completed cardiac work-up, no significant coronary disease, medical management recommended --No changes admission in presentation -Elevated troponin chronic in the setting of underlying renal failure Creatinine close to 6 --Further admissions to the hospital not needed for his atypical chest pain --In follow-up visits consider checking cocaine/tox screen  End-stage renal disease on hemodialysis Monday Wednesday Friday --Appears relatively euvolemic As hemodialysis tomorrow  Hypokalemia Managed at dialysis Trending up on repeat Would not replete  Chronic pain Recommended he follow-up in the pain clinic He reports that he would likely " go back to [place]" That is where they were going to do several surgeries like on his hip  History of cocaine abuse Not checked this admission Can cause vasospasm Would check cocaine on future visits   Total encounter time more than 110 minutes  Greater than 50% was spent in counseling and coordination of care with the patient   Signed, Esmond Plants, MD, Ph.D [facility]  For questions or updates, please contact   Please consult [email or web address] for contact info under Cardiology/STEMI.

## 2019-10-24 NOTE — ED Notes (Signed)
Pt resting and watching tV, awaiting bed assignment.

## 2019-10-24 NOTE — ED Notes (Addendum)
Pt c/o continued CP. Offered PRN nitro and tylenol orders, refused. Pt wanting to take him home oxycontin dose. Sent secure chat to Dr. Manuella Ghazi to request a review of home medications and inform that pt requesting this specific med. Patient informed of plan.

## 2019-10-24 NOTE — ED Notes (Signed)
No breakfast sent up for patient, called dietary and requesting tray be sent.

## 2019-10-24 NOTE — ED Notes (Signed)
Pt eating breakfast at this time. Updated on plan of care for discharge after pending troponin results.

## 2019-10-24 NOTE — ED Notes (Signed)
At DC pt mention provider was going to give him a time release Oxycontin TR for his chronic chest. I messaged admitting provider.

## 2019-10-24 NOTE — ED Notes (Signed)
Dr. Shah at bedside.

## 2019-10-24 NOTE — ED Notes (Signed)
Pt awaiting admitting MD to eval.

## 2019-10-24 NOTE — H&P (Signed)
History and Physical    [name] LGX:[id number] DOB: 13-Dec-1955 DOA: 10/24/2019  PCP: Patient, No Pcp Per   Patient coming from: Home  I have personally briefly reviewed patient's old medical records in [place]  Chief Complaint: Chest pain  HPI: [name] is a 64 y.o. male with medical history significant for ESRD on HD MWF, CAD with history of MI but no PCI, history of PE on apixaban, diastolic heart failure, anemia of chronic disease,  chronic pain on opiates and history of cocaine use, with recent hospitalization from 09/28/19 09/30/2019 with chest pain,  with left heart cath showing nonobstructive CAD with 20% stenosis proximal RCA and 30% mid RCA, who returns to the emergency room with a complaint of retrosternal tightness radiating to the neck and back, to his recent prior presentation with chest pain, 8/10 on pain scale.  He denies cocaine use in the past month.  He denies nausea vomiting or diaphoresis.  Denies leg pain or swelling.  He is compliant with his apixaban.  ED Course: Vitals in the ER were within normal limits.  First troponin 110.  Troponin was as high as 150 on 10/18/2019.  Otherwise blood work remarkable for potassium 2.9, hemoglobin 9.3 and platelets 105.  Otherwise findings in keeping with dialysis patient.  Chest x-ray showed mild interstitial edema.  She showed nonspecific ST-T wave changes unchanged from prior EKGs.  Chest pain was not relieved with aspirin which he took at home and he had partial relief with morphine in the ER.  Hospitalist consulted for observation admission  Review of Systems: As per HPI otherwise 10 point review of systems negative.    Past Medical History:  Diagnosis Date  . Depression   . Heart attack ([place])   . Hypertension   . PE (pulmonary embolism)   . Renal disorder     Past Surgical History:  Procedure Laterality Date  . LEFT HEART CATH AND CORONARY ANGIOGRAPHY N/A 09/30/2019   Procedure: LEFT HEART CATH AND  CORONARY ANGIOGRAPHY;  Surgeon: Wellington Hampshire, MD;  Location: [facility];  Service: Cardiovascular;  Laterality: N/A;  . TOTAL HIP ARTHROPLASTY       reports that he has been smoking cigarettes. He has been smoking about 0.50 packs per day. He has never used smokeless tobacco. He reports that he does not drink alcohol or use drugs.  Allergies  Allergen Reactions  . Cyclobenzaprine Nausea Only  . Clonidine Other (See Comments)    Asymptomatic bradycardia to 38  . Furosemide Other (See Comments)    Caused renal failure  . Tylenol [Acetaminophen] Other (See Comments)    Reaction:  Pt states it bothers his liver    No family history on file.   Prior to Admission medications   Medication Sig Start Date End Date Taking? Authorizing Provider  albuterol (VENTOLIN HFA) 108 (90 Base) MCG/ACT inhaler Inhale 2 puffs into the lungs every 6 (six) hours as needed for wheezing or shortness of breath. 06/23/19   Jennye Boroughs, MD  amLODipine (NORVASC) 10 MG tablet Take 1 tablet (10 mg total) by mouth daily. 06/23/19 06/22/20  Jennye Boroughs, MD  apixaban (ELIQUIS) 2.5 MG TABS tablet Take 1 tablet (2.5 mg total) by mouth 2 (two) times daily. 06/23/19   Jennye Boroughs, MD  atorvastatin (LIPITOR) 40 MG tablet Take 1 tablet (40 mg total) by mouth at bedtime. 06/23/19 06/22/20  Jennye Boroughs, MD  AURYXIA 1 GM 210 MG(Fe) tablet Take 210 mg  by mouth See admin instructions. Take 1 tablet (210mg ) by mouth three times daily with meals and take 1 tablet (210mg ) by mouth with snacks 06/28/19   [provider]  hydrALAZINE (APRESOLINE) 100 MG tablet Take 1 tablet (100 mg total) by mouth 3 (three) times daily. 06/23/19   Jennye Boroughs, MD  irbesartan (AVAPRO) 75 MG tablet Take 1 tablet (75 mg total) by mouth at bedtime. 06/23/19   Jennye Boroughs, MD  isosorbide mononitrate (IMDUR) 60 MG 24 hr tablet Take 1 tablet (60 mg total) by mouth daily. Patient not taking: Reported on 09/28/2019  06/23/19   Jennye Boroughs, MD  ondansetron (ZOFRAN) 4 MG tablet Take 1 tablet (4 mg total) by mouth every 6 (six) hours as needed for nausea. 07/08/19   Loletha Grayer, MD  oxyCODONE (OXY IR/ROXICODONE) 5 MG immediate release tablet Take 5-10 mg by mouth every 6 (six) hours as needed for severe pain.    [provider]  oxyCODONE (OXYCONTIN) 20 mg 12 hr tablet Take 20 mg by mouth every 12 (twelve) hours.    [provider]  polyethylene glycol (MIRALAX / GLYCOLAX) 17 g packet Take 17 g by mouth daily as needed for mild constipation. 07/08/19   Loletha Grayer, MD  sevelamer carbonate (RENVELA) 800 MG tablet Take 1 tablet (800 mg total) by mouth 3 (three) times daily with meals. 06/23/19   Jennye Boroughs, MD  tamsulosin (FLOMAX) 0.4 MG CAPS capsule Take 1 capsule (0.4 mg total) by mouth daily. 09/27/16   Gladstone Lighter, MD  torsemide (DEMADEX) 100 MG tablet Take 1 tablet (100 mg total) by mouth daily. 10/01/19   Jennye Boroughs, MD    Physical Exam: Vitals:   10/23/19 2124 10/23/19 2127 10/24/19 0158 10/24/19 0245  BP:  (!) 159/89 (!) 154/91 (!) 141/83  Pulse:  70 70 85  Resp:  20 20   Temp:  98.3 F (36.8 C) 98.6 F (37 C)   TempSrc:  Oral Oral   SpO2:  100% 100% 99%  Weight: 108.9 kg     Height: 6' (1.829 m)        Vitals:   10/23/19 2124 10/23/19 2127 10/24/19 0158 10/24/19 0245  BP:  (!) 159/89 (!) 154/91 (!) 141/83  Pulse:  70 70 85  Resp:  20 20   Temp:  98.3 F (36.8 C) 98.6 F (37 C)   TempSrc:  Oral Oral   SpO2:  100% 100% 99%  Weight: 108.9 kg     Height: 6' (1.829 m)       Constitutional: Alert and awake, oriented x3, not in any acute distress. Eyes: PERLA, EOMI, irises appear normal, anicteric sclera,  ENMT: external ears and nose appear normal, normal hearing             Lips appears normal, oropharynx mucosa, tongue, posterior pharynx appear normal  Neck: neck appears normal, no masses, normal ROM, no thyromegaly, no JVD  CVS: S1-S2  clear, no murmur rubs or gallops,  , no carotid bruits, pedal pulses palpable, No LE edema Respiratory:  clear to auscultation bilaterally, no wheezing, rales or rhonchi. Respiratory effort normal. No accessory muscle use.  Abdomen: soft nontender, nondistended, normal bowel sounds, no hepatosplenomegaly, no hernias Musculoskeletal: : no cyanosis, clubbing , no contractures or atrophy Neuro: Cranial nerves II-XII intact, sensation, reflexes normal, strength Psych: judgement and insight appear normal, stable mood and affect,  Skin: no rashes or lesions or ulcers, no induration or nodules   Labs on Admission: I  have personally reviewed following labs and imaging studies  CBC: Recent Labs  Lab 10/18/19 1440 10/23/19 2133  WBC 6.8 5.8  HGB 9.6* 9.3*  HCT 29.0* 27.0*  MCV 90.6 89.1  PLT 115* 408*   Basic Metabolic Panel: Recent Labs  Lab 10/18/19 1440 10/23/19 2133  NA 137 140  K 2.9* 2.9*  CL 101 101  CO2 27 28  GLUCOSE 93 112*  BUN 27* 23  CREATININE 6.85* 5.28*  CALCIUM 8.1* 8.4*   GFR: Estimated Creatinine Clearance: 18.7 mL/min (A) (by C-G formula based on SCr of 5.28 mg/dL (H)). Liver Function Tests: Recent Labs  Lab 10/18/19 1649 10/23/19 2133  AST 17 15  ALT 12 13  ALKPHOS 54 49  BILITOT 0.8 0.8  PROT 6.4* 6.2*  ALBUMIN 3.5 3.4*   Recent Labs  Lab 10/18/19 1649  LIPASE 32   No results for input(s): AMMONIA in the last 168 hours. Coagulation Profile: No results for input(s): INR, PROTIME in the last 168 hours. Cardiac Enzymes: No results for input(s): CKTOTAL, CKMB, CKMBINDEX, TROPONINI in the last 168 hours. BNP (last 3 results) No results for input(s): PROBNP in the last 8760 hours. HbA1C: No results for input(s): HGBA1C in the last 72 hours. CBG: No results for input(s): GLUCAP in the last 168 hours. Lipid Profile: No results for input(s): CHOL, HDL, LDLCALC, TRIG, CHOLHDL, LDLDIRECT in the last 72 hours. Thyroid Function Tests: No results for  input(s): TSH, T4TOTAL, FREET4, T3FREE, THYROIDAB in the last 72 hours. Anemia Panel: No results for input(s): VITAMINB12, FOLATE, FERRITIN, TIBC, IRON, RETICCTPCT in the last 72 hours. Urine analysis:    Component Value Date/Time   COLORURINE YELLOW (A) 08/20/2018 0230   APPEARANCEUR CLOUDY (A) 08/20/2018 0230   APPEARANCEUR Clear 11/08/2014 1559   LABSPEC 1.010 08/20/2018 0230   LABSPEC 1.010 11/08/2014 1559   PHURINE 9.0 (H) 08/20/2018 0230   GLUCOSEU NEGATIVE 08/20/2018 0230   GLUCOSEU Negative 11/08/2014 1559   HGBUR NEGATIVE 08/20/2018 0230   BILIRUBINUR NEGATIVE 08/20/2018 0230   BILIRUBINUR Negative 11/08/2014 1559   KETONESUR NEGATIVE 08/20/2018 0230   PROTEINUR 100 (A) 08/20/2018 0230   UROBILINOGEN 1.0 10/28/2010 1100   NITRITE POSITIVE (A) 08/20/2018 0230   LEUKOCYTESUR LARGE (A) 08/20/2018 0230   LEUKOCYTESUR Negative 11/08/2014 1559    Radiological Exams on Admission: DG Chest 2 View  Result Date: 10/23/2019 CLINICAL DATA:  Mid chest pain radiating into back, shortness of breath, end-stage renal disease EXAM: CHEST - 2 VIEW COMPARISON:  10/18/2019 FINDINGS: Frontal and lateral views of the chest demonstrates stable enlargement of the cardiac silhouette and ectasia of the thoracic aorta. Lung volumes are slightly diminished, with crowding of the central vasculature. Mild increased interstitial prominence is noted, favor mild interstitial edema. No airspace disease, effusion, or pneumothorax. IMPRESSION: 1. Mild interstitial edema. Electronically Signed   By: Randa Ngo M.D.   On: 10/23/2019 21:55    EKG: Independently reviewed.   Assessment/Plan Active Problems:   Chest pain of uncertain etiology   History of MI (myocardial infarction)   Elevated troponin -Recent left heart cath showing nonobstructive CAD with 30% stenosis mid RCA, 20% proximal RCA. -Low suspicion for acute PE given apixaban therapy -Consideration for possible coronary vasospasm  though  patient denies recent use -Troponin elevated at 110.  Continue to trend -Continue nitroglycerin as needed chest pain with morphine for breakthrough -Continue antiplatelets and statins.  Avoid beta-blockers due to possibility of cocaine use    History of substance abuse ([place]) -  Patient denies recent use of cocaine    History of pulmonary embolism -Continue apixaban    ESRD (end stage renal disease) on dialysis ([place])   Anemia of chronic disease -Hemoglobin at baseline -Nephrology consult for continuation of dialysis    Chronic diastolic CHF (congestive heart failure) ([place]) -Chest x-ray shows mild interstitial edema but patient appears mostly euvolemic -Continue torsemide.  Not currently on ACE/ARB or beta blocker   DVT prophylaxis: Continue apixaban Code Status: full code  Family Communication:  none  Disposition Plan: Back to previous home environment Consults called: Nephrology, Dr. Candiss Norse Status:obs    Athena Masse MD [facility]     10/24/2019, 3:54 AM

## 2019-11-15 ENCOUNTER — Emergency Department: Payer: Medicare Other

## 2019-11-15 ENCOUNTER — Other Ambulatory Visit: Payer: Self-pay

## 2019-11-15 DIAGNOSIS — N186 End stage renal disease: Secondary | ICD-10-CM | POA: Diagnosis not present

## 2019-11-15 DIAGNOSIS — Z79899 Other long term (current) drug therapy: Secondary | ICD-10-CM | POA: Insufficient documentation

## 2019-11-15 DIAGNOSIS — F17210 Nicotine dependence, cigarettes, uncomplicated: Secondary | ICD-10-CM | POA: Diagnosis not present

## 2019-11-15 DIAGNOSIS — R0789 Other chest pain: Secondary | ICD-10-CM | POA: Diagnosis not present

## 2019-11-15 DIAGNOSIS — Z992 Dependence on renal dialysis: Secondary | ICD-10-CM | POA: Insufficient documentation

## 2019-11-15 DIAGNOSIS — I5032 Chronic diastolic (congestive) heart failure: Secondary | ICD-10-CM | POA: Insufficient documentation

## 2019-11-15 DIAGNOSIS — I132 Hypertensive heart and chronic kidney disease with heart failure and with stage 5 chronic kidney disease, or end stage renal disease: Secondary | ICD-10-CM | POA: Insufficient documentation

## 2019-11-15 LAB — CBC
HCT: 28.3 % — ABNORMAL LOW (ref 39.0–52.0)
Hemoglobin: 9.4 g/dL — ABNORMAL LOW (ref 13.0–17.0)
MCH: 30 pg (ref 26.0–34.0)
MCHC: 33.2 g/dL (ref 30.0–36.0)
MCV: 90.4 fL (ref 80.0–100.0)
Platelets: 125 10*3/uL — ABNORMAL LOW (ref 150–400)
RBC: 3.13 MIL/uL — ABNORMAL LOW (ref 4.22–5.81)
RDW: 14.4 % (ref 11.5–15.5)
WBC: 5.8 10*3/uL (ref 4.0–10.5)
nRBC: 0 % (ref 0.0–0.2)

## 2019-11-15 LAB — TROPONIN I (HIGH SENSITIVITY): Troponin I (High Sensitivity): 120 ng/L (ref <18)

## 2019-11-15 LAB — BASIC METABOLIC PANEL
Anion gap: 10 (ref 5–15)
BUN: 26 mg/dL — ABNORMAL HIGH (ref 8–23)
CO2: 25 mmol/L (ref 22–32)
Calcium: 8.7 mg/dL — ABNORMAL LOW (ref 8.9–10.3)
Chloride: 105 mmol/L (ref 98–111)
Creatinine, Ser: 5.76 mg/dL — ABNORMAL HIGH (ref 0.61–1.24)
GFR calc Af Amer: 11 mL/min — ABNORMAL LOW (ref >60)
GFR calc non Af Amer: 10 mL/min — ABNORMAL LOW (ref >60)
Glucose, Bld: 94 mg/dL (ref 70–99)
Potassium: 3.6 mmol/L (ref 3.5–5.1)
Sodium: 140 mmol/L (ref 135–145)

## 2019-11-15 NOTE — ED Triage Notes (Signed)
Patient to triage via wheelchair by EMS.  Per EMS patient with complaint of chest pain for past 2 days that got worse tonight.  Reports pain mid sternal.  Patient took NGT x 1 at home with no change in pain and ASA 324mg .  EMS 12-lead showing A Fib. IV placed by EMS 20 g to right hand.  Patient is dialysis patient had dialysis on Friday and completed treatment without difficulty.

## 2019-11-16 ENCOUNTER — Emergency Department
Admission: EM | Admit: 2019-11-16 | Discharge: 2019-11-16 | Disposition: A | Discharge status: Home / Self Care | Payer: Medicare Other | Attending: Student | Admitting: Student

## 2019-11-16 ENCOUNTER — Other Ambulatory Visit: Payer: Self-pay

## 2019-11-16 DIAGNOSIS — R079 Chest pain, unspecified: Secondary | ICD-10-CM

## 2019-11-16 LAB — TROPONIN I (HIGH SENSITIVITY): Troponin I (High Sensitivity): 105 ng/L (ref <18)

## 2019-11-16 MED ORDER — OXYCODONE HCL 5 MG PO TABS
5.0000 mg | ORAL_TABLET | Freq: Once | ORAL | Status: AC
  Administered 2019-11-16: 5 mg via ORAL
  Filled 2019-11-16: qty 1

## 2019-11-16 MED ORDER — MORPHINE SULFATE (PF) 4 MG/ML IV SOLN
4.0000 mg | Freq: Once | INTRAVENOUS | Status: AC
  Administered 2019-11-16: 4 mg via INTRAVENOUS
  Filled 2019-11-16: qty 1

## 2019-11-16 MED ORDER — ONDANSETRON 4 MG PO TBDP
4.0000 mg | ORAL_TABLET | Freq: Once | ORAL | Status: DC
  Filled 2019-11-16: qty 1

## 2019-11-16 NOTE — ED Notes (Signed)
Patient rolled out to lobby to wait for ride and began dry heaving. Patient brought back to room. MD informed.

## 2019-11-16 NOTE — Discharge Instructions (Addendum)
Thank you for letting us take care of you in the emergency department today.  ° °Please continue to take any regular, prescribed medications.  ° °Please follow up with: °- Your cardiology doctor to review your ER visit and follow up on your symptoms.  ° °Please return to the ER for any new or worsening symptoms.  ° °

## 2019-11-16 NOTE — ED Notes (Signed)
RN to room to administer zofran and patient had left.

## 2019-11-16 NOTE — ED Provider Notes (Signed)
[facility] Emergency Department Provider Note  ____________________________________________   First MD Initiated Contact with Patient 11/16/19 0023     (approximate)  I have reviewed the triage vital signs and the nursing notes.  History  Chief Complaint Chest Pain    HPI [name] is a 64 y.o. male with history ESRD (MWF), CAD, diastolic dysfunction, MI, PE on anticoagulation, dilated aortic root, substance abuse who presents to the ER for recurrent chest pain.   Patient states since this morning he has had ongoing chest pain.  Describes it as sharp, radiating to the back.  Moderate to severe, asking for IV pain medications.  Nothing seems to make it better or worse.  Tried taking 4 baby ASA and nitroglycerin prior to arrival without significant relief.  No identifiable trigger.  Reports associated diaphoresis.  Denies any recent alcohol, tobacco, or illicit drug use of late as he is trying to qualify for renal transplant. On chart review, description of his chest pain seems similar to his several recent presentations.   Patient had recent heart catheterization performed 09/30/2019 that revealed mild, nonobstructive CAD.  No procedural interventions done, recommended aggressive medical therapy.  Patient has been seen in the ED several times since his catheterization procedure for similar symptoms, with high-sensitivity troponins (range 90s-150) slightly elevated in the setting of his ESRD, but otherwise stable and has had an otherwise negative work-up, including heart catheterization as described above, and echocardiogram with EF 55 to 60% and grade 2 diastolic dysfunction.  Most recently admitted under observation on 4/15.  Seen by Dr. Rockey Situ during this time.  According to Dr. Donivan Scull note, further admissions to the hospital not needed for his atypical chest pain and consider checking cocaine/tox screen to evaluate as potential precipitant for  pain.   Past Medical Hx Past Medical History:  Diagnosis Date  . Depression   . Diastolic dysfunction    a. 09/2019 Echo: EF 55-60%, no rwma, mod LVH, gr2 DD. Nl RV size/fxn. Mildly dil LA. Ao root 4.5cm.  . Dilated aortic root ([place])    a. 09/2019 Echo: Ao root 4.5cm.  . Elevated troponin level not due myocardial infarction   . ESRD (end stage renal disease) ([place])   . Hypertension   . Nonobstructive Coronary Artery Disease    a. 09/2019 Cath: LM nl, LAD min irregs, D1 nl, D2 min irregs, LCX large, min irregs, RCA large, 20p, 30p/m.  . PE (pulmonary embolism)     Problem List Patient Active Problem List   Diagnosis Date Noted  . History of substance abuse ([place]) 10/24/2019  . Elevated troponin 10/24/2019  . Chest pain 10/24/2019  . Chronic diastolic CHF (congestive heart failure) ([place]) 09/28/2019  . History of MI (myocardial infarction) 09/28/2019  . Anemia of chronic kidney failure, stage 5 ([place]) 09/28/2019  . History of pulmonary embolism 09/28/2019  . Chronic midline low back pain   . Acute respiratory failure with hypoxia ([place])   . Acute diastolic CHF (congestive heart failure) ([place])   . Anemia of chronic disease   . Thrombocytopenia ([place])   . Congestive heart failure ([place])   . Fluid overload 06/21/2019  . Volume overload 11/15/2018  . ESRD (end stage renal disease) on dialysis ([place]) 11/14/2018  . HCAP (healthcare-associated pneumonia) 08/27/2018  . Dyspnea 08/25/2018  . Chest pain of uncertain etiology 18/29/9371  . ESRD on dialysis ([place]) 11/08/2016  . Prostate cancer screening 11/08/2016  . Acquired cyst of kidney 11/08/2016  . Urinary  urgency 11/08/2016  . Acute on chronic renal failure ([place]) 09/23/2016  . Hypertensive urgency 09/21/2016  . Dysthymia 02/26/2016  . Chronic pain 02/26/2016  . Noncompliance with renal dialysis ([place]) 02/26/2016  . Opiate abuse, continuous ([place]) 02/03/2016  . Substance induced mood disorder ([place]) 02/03/2016  . Antisocial personality  disorder ([place]) 02/03/2016  . Left-sided weakness 01/18/2016  . HEPATITIS C 06/28/2008  . HLD (hyperlipidemia) 06/28/2008  . SUBSTANCE ABUSE, MULTIPLE 06/28/2008  . Hypertension 06/27/2008    Past Surgical Hx Past Surgical History:  Procedure Laterality Date  . LEFT HEART CATH AND CORONARY ANGIOGRAPHY N/A 09/30/2019   Procedure: LEFT HEART CATH AND CORONARY ANGIOGRAPHY;  Surgeon: Wellington Hampshire, MD;  Location: [facility];  Service: Cardiovascular;  Laterality: N/A;  . TOTAL HIP ARTHROPLASTY      Medications Prior to Admission medications   Medication Sig Start Date End Date Taking? Authorizing Provider  amLODipine (NORVASC) 10 MG tablet Take 1 tablet (10 mg total) by mouth daily. 06/23/19 06/22/20  Jennye Boroughs, MD  apixaban (ELIQUIS) 2.5 MG TABS tablet Take 1 tablet (2.5 mg total) by mouth 2 (two) times daily. 06/23/19   Jennye Boroughs, MD  atorvastatin (LIPITOR) 40 MG tablet Take 1 tablet (40 mg total) by mouth at bedtime. 10/24/19 11/23/19  Max Sane, MD  AURYXIA 1 GM 210 MG(Fe) tablet Take 210 mg by mouth See admin instructions. Take 1 tablet (210mg ) by mouth three times daily with meals and take 1 tablet (210mg ) by mouth with snacks 06/28/19   [provider]  hydrALAZINE (APRESOLINE) 100 MG tablet Take 1 tablet (100 mg total) by mouth 3 (three) times daily. 06/23/19   Jennye Boroughs, MD  irbesartan (AVAPRO) 75 MG tablet Take 1 tablet (75 mg total) by mouth at bedtime. 10/24/19   Max Sane, MD  isosorbide mononitrate (IMDUR) 60 MG 24 hr tablet Take 1 tablet (60 mg total) by mouth daily. 10/24/19   Max Sane, MD  ondansetron (ZOFRAN) 4 MG tablet Take 1 tablet (4 mg total) by mouth every 6 (six) hours as needed for nausea. 10/24/19   Max Sane, MD  oxyCODONE (OXY IR/ROXICODONE) 5 MG immediate release tablet Take 5-10 mg by mouth every 6 (six) hours as needed for severe pain.    [provider]  oxyCODONE (OXYCONTIN) 20 mg 12 hr tablet Take 20 mg by  mouth every 12 (twelve) hours.    [provider]  pantoprazole (PROTONIX) 40 MG tablet Take 1 tablet (40 mg total) by mouth daily. 10/24/19   Max Sane, MD    Allergies Cyclobenzaprine, Clonidine, Furosemide, and Tylenol [acetaminophen]  Family Hx No family history on file.  Social Hx Social History   Tobacco Use  . Smoking status: Current Every Day Smoker    Packs/day: 0.50    Types: Cigarettes  . Smokeless tobacco: Never Used  Substance Use Topics  . Alcohol use: No  . Drug use: No    Comment: pt has been positive for cocaine twice this week but denies use     Review of Systems  Constitutional: Negative for fever. Negative for chills. Eyes: Negative for visual changes. ENT: Negative for sore throat. Cardiovascular: + for chest pain. Respiratory: Negative for shortness of breath. Gastrointestinal: Negative for nausea. Negative for vomiting.  Genitourinary: Negative for dysuria. Musculoskeletal: Negative for leg swelling. Skin: Negative for rash. Neurological: Negative for headaches.   Physical Exam  Vital Signs: ED Triage Vitals  Enc Vitals Group     BP 11/15/19  2309 (!) 183/97     Pulse Rate 11/15/19 2309 79     Resp 11/15/19 2309 20     Temp 11/15/19 2309 98.3 F (36.8 C)     Temp Source 11/15/19 2309 Oral     SpO2 11/15/19 2309 99 %     Weight 11/15/19 2308 239 lb (108.4 kg)     Height 11/15/19 2308 6' (1.829 m)     Head Circumference --      Peak Flow --      Pain Score 11/15/19 2308 9     Pain Loc --      Pain Edu? --      Excl. in [place]? --     Constitutional: Alert and oriented. Well appearing. NAD.  Head: Normocephalic. Atraumatic. Eyes: Conjunctivae clear. Sclera anicteric. Pupils equal and symmetric. Nose: No masses or lesions. No congestion or rhinorrhea. Mouth/Throat: Wearing mask.  Neck: No stridor. Trachea midline.  Cardiovascular: Normal rate, regular rhythm. Extremities well perfused. Equal and symmetric radial pulses.  Dialysis fistula in LUE w/ palpable thrill.  Respiratory: Normal respiratory effort.  Lungs CTAB. Gastrointestinal: Soft. Non-distended. Non-tender.  Genitourinary: Deferred. Musculoskeletal: No lower extremity edema. No deformities. Neurologic:  Normal speech and language. No gross focal or lateralizing neurologic deficits are appreciated.  Skin: Skin is warm, dry and intact. No rash noted. Psychiatric: Mood and affect are appropriate for situation.  EKG  Personally reviewed and interpreted by myself.   Date: 11/15/19 Time: 2304 Rate: 80 Rhythm: irregular Axis: normal Intervals: WNL TWI I, aVL, seen previously  No STEMI    Radiology  Personally reviewed available imaging myself.   CXR - IMPRESSION:  Cardiomegaly with mild central congestion    Procedures  Procedure(s) performed (including critical care):  Procedures   Initial Impression / Assessment and Plan / MDM / ED Course  64 y.o. male who presents to the ED with recurrent chest pain. Further detailed history as above.   Ddx: atypical chest pain, ACS, MSK, pleurisy. Doubt PE as he reports compliance on his anticoagulation.  Will plan for labs, EKG.  Patient took 4 baby ASA prior to arrival.  EKG seems to demonstrate an irregular rhythm, possibly atrial fibrillation, which appears new on chart review.  Rate controlled, no evidence of ischemia, and already anticoagulated in the setting of his history of DVT/PE.  Initial troponin 120, down trended to 105.  Per chart review, these are within his normal range especially in the setting of his ESRD, reassured by his down tredning.  Presentation seems consistent with his priors, and per recent cardiology note, no further work-up or admission is indicated, given his recent thorough cardiac work up, including left heart cath with nonobstructive disease.  Will confirm this plan with cardiology and otherwise anticipate discharge.  Attempted to reach on call cardiology for [facility]  (as patient was previously seen by Dr. Rockey Situ), but unfortunately unable to reach Dr. Caryl Comes despite paging x 2 and sending secure Epic message. Discussed with Dr. Nehemiah Massed and reviewed case, agrees with plan as above, greatly appreciate his assistance. Will have patient follow up with cardiology given possible new onset AF, though no new medications indicated as he is already on anticoagulation and is otherwise rate controlled. Stable for discharge. Given return precautions.    _______________________________   As part of my medical decision making I have reviewed available labs, radiology tests, reviewed old records/performed chart review, obtained additional history from family, and discussed with consultants (cardiology).     Final  Clinical Impression(s) / ED Diagnosis  Final diagnoses:  Chest pain in adult       Note:  This document was prepared using Dragon voice recognition software and may include unintentional dictation errors.   Lilia Pro., MD 11/16/19 [phone]

## 2019-11-16 NOTE — ED Notes (Signed)
Reviewed discharge instructions, follow-up care. Patient verbalized understanding of all information reviewed. Patient stable, with no distress noted at this time.

## 2019-11-17 ENCOUNTER — Encounter: Payer: Self-pay | Admitting: Emergency Medicine

## 2019-11-17 ENCOUNTER — Other Ambulatory Visit: Payer: Self-pay

## 2019-11-17 ENCOUNTER — Emergency Department: Payer: Medicare Other

## 2019-11-17 ENCOUNTER — Emergency Department
Admission: EM | Admit: 2019-11-17 | Discharge: 2019-11-17 | Disposition: A | Discharge status: Home / Self Care | Payer: Medicare Other | Attending: Emergency Medicine | Admitting: Emergency Medicine

## 2019-11-17 DIAGNOSIS — I132 Hypertensive heart and chronic kidney disease with heart failure and with stage 5 chronic kidney disease, or end stage renal disease: Secondary | ICD-10-CM | POA: Diagnosis not present

## 2019-11-17 DIAGNOSIS — N186 End stage renal disease: Secondary | ICD-10-CM | POA: Diagnosis not present

## 2019-11-17 DIAGNOSIS — Z7901 Long term (current) use of anticoagulants: Secondary | ICD-10-CM | POA: Insufficient documentation

## 2019-11-17 DIAGNOSIS — I5032 Chronic diastolic (congestive) heart failure: Secondary | ICD-10-CM | POA: Insufficient documentation

## 2019-11-17 DIAGNOSIS — Z992 Dependence on renal dialysis: Secondary | ICD-10-CM | POA: Insufficient documentation

## 2019-11-17 DIAGNOSIS — Z79899 Other long term (current) drug therapy: Secondary | ICD-10-CM | POA: Insufficient documentation

## 2019-11-17 DIAGNOSIS — F17210 Nicotine dependence, cigarettes, uncomplicated: Secondary | ICD-10-CM | POA: Diagnosis not present

## 2019-11-17 DIAGNOSIS — I252 Old myocardial infarction: Secondary | ICD-10-CM | POA: Insufficient documentation

## 2019-11-17 DIAGNOSIS — Z96649 Presence of unspecified artificial hip joint: Secondary | ICD-10-CM | POA: Diagnosis not present

## 2019-11-17 DIAGNOSIS — R079 Chest pain, unspecified: Secondary | ICD-10-CM | POA: Diagnosis present

## 2019-11-17 LAB — BASIC METABOLIC PANEL
Anion gap: 13 (ref 5–15)
BUN: 50 mg/dL — ABNORMAL HIGH (ref 8–23)
CO2: 23 mmol/L (ref 22–32)
Calcium: 8.3 mg/dL — ABNORMAL LOW (ref 8.9–10.3)
Chloride: 104 mmol/L (ref 98–111)
Creatinine, Ser: 8.61 mg/dL — ABNORMAL HIGH (ref 0.61–1.24)
GFR calc Af Amer: 7 mL/min — ABNORMAL LOW (ref >60)
GFR calc non Af Amer: 6 mL/min — ABNORMAL LOW (ref >60)
Glucose, Bld: 106 mg/dL — ABNORMAL HIGH (ref 70–99)
Potassium: 3.8 mmol/L (ref 3.5–5.1)
Sodium: 140 mmol/L (ref 135–145)

## 2019-11-17 LAB — CBC
HCT: 26.6 % — ABNORMAL LOW (ref 39.0–52.0)
Hemoglobin: 8.8 g/dL — ABNORMAL LOW (ref 13.0–17.0)
MCH: 29.9 pg (ref 26.0–34.0)
MCHC: 33.1 g/dL (ref 30.0–36.0)
MCV: 90.5 fL (ref 80.0–100.0)
Platelets: 117 10*3/uL — ABNORMAL LOW (ref 150–400)
RBC: 2.94 MIL/uL — ABNORMAL LOW (ref 4.22–5.81)
RDW: 14.6 % (ref 11.5–15.5)
WBC: 5 10*3/uL (ref 4.0–10.5)
nRBC: 0 % (ref 0.0–0.2)

## 2019-11-17 LAB — TROPONIN I (HIGH SENSITIVITY)
Troponin I (High Sensitivity): 106 ng/L (ref <18)
Troponin I (High Sensitivity): 110 ng/L (ref <18)

## 2019-11-17 MED ORDER — SODIUM CHLORIDE 0.9% FLUSH: 3.0000 mL | Freq: Once | INTRAVENOUS | Status: DC

## 2019-11-17 MED ORDER — OXYCODONE HCL 5 MG PO TABS
10.0000 mg | ORAL_TABLET | Freq: Once | ORAL | Status: AC
  Administered 2019-11-17: 22:00:00 10 mg via ORAL
  Filled 2019-11-17: qty 2

## 2019-11-17 NOTE — ED Triage Notes (Signed)
Pt to ER with c/o mid sternal chest pain that radiates into back for 2-3 days. Pt states was seen here yesterday, but left AMA.  Pt states pain continues.  PT endorses n/v, denies SHOB.

## 2019-11-17 NOTE — ED Notes (Signed)
Date and time results received: 11/17/19 .now (use smartphrase ".now" to insert current time)  Test: Trop  Critical Value: 106  Name of Provider Notified: Paduchowski  Orders Received? Or Actions Taken?: Critical results acknowledged

## 2019-11-17 NOTE — ED Provider Notes (Signed)
[facility] Emergency Department Provider Note  Time seen: 10:18 PM  I have reviewed the triage vital signs and the nursing notes.   HISTORY  Chief Complaint Chest Pain    HPI [name] is a 64 y.o. male with a past medical history of CHF, depression, ESRD on HD, hypertension, presents to the emergency department for chest pain.  According to the patient for the past week or so he has been experiencing chest pain.  Patient states a long history of chest pain.  Denies any shortness of breath denies any cough or fever.  No vomiting.   Patient states he was seen here yesterday but left AMA.  Overall the patient appears well, no acute distress, lying in bed.  Denies any drug use.  Past Medical History:  Diagnosis Date  . Depression   . Diastolic dysfunction    a. 09/2019 Echo: EF 55-60%, no rwma, mod LVH, gr2 DD. Nl RV size/fxn. Mildly dil LA. Ao root 4.5cm.  . Dilated aortic root ([place])    a. 09/2019 Echo: Ao root 4.5cm.  . Elevated troponin level not due myocardial infarction   . ESRD (end stage renal disease) ([place])   . Hypertension   . Nonobstructive Coronary Artery Disease    a. 09/2019 Cath: LM nl, LAD min irregs, D1 nl, D2 min irregs, LCX large, min irregs, RCA large, 20p, 30p/m.  . PE (pulmonary embolism)     Patient Active Problem List   Diagnosis Date Noted  . History of substance abuse ([place]) 10/24/2019  . Elevated troponin 10/24/2019  . Chest pain 10/24/2019  . Chronic diastolic CHF (congestive heart failure) ([place]) 09/28/2019  . History of MI (myocardial infarction) 09/28/2019  . Anemia of chronic kidney failure, stage 5 ([place]) 09/28/2019  . History of pulmonary embolism 09/28/2019  . Chronic midline low back pain   . Acute respiratory failure with hypoxia ([place])   . Acute diastolic CHF (congestive heart failure) ([place])   . Anemia of chronic disease   . Thrombocytopenia ([place])   . Congestive heart failure ([place])   . Fluid overload 06/21/2019   . Volume overload 11/15/2018  . ESRD (end stage renal disease) on dialysis ([place]) 11/14/2018  . HCAP (healthcare-associated pneumonia) 08/27/2018  . Dyspnea 08/25/2018  . Chest pain of uncertain etiology 38/18/2993  . ESRD on dialysis ([place]) 11/08/2016  . Prostate cancer screening 11/08/2016  . Acquired cyst of kidney 11/08/2016  . Urinary urgency 11/08/2016  . Acute on chronic renal failure ([place]) 09/23/2016  . Hypertensive urgency 09/21/2016  . Dysthymia 02/26/2016  . Chronic pain 02/26/2016  . Noncompliance with renal dialysis ([place]) 02/26/2016  . Opiate abuse, continuous ([place]) 02/03/2016  . Substance induced mood disorder ([place]) 02/03/2016  . Antisocial personality disorder ([place]) 02/03/2016  . Left-sided weakness 01/18/2016  . HEPATITIS C 06/28/2008  . HLD (hyperlipidemia) 06/28/2008  . SUBSTANCE ABUSE, MULTIPLE 06/28/2008  . Hypertension 06/27/2008    Past Surgical History:  Procedure Laterality Date  . LEFT HEART CATH AND CORONARY ANGIOGRAPHY N/A 09/30/2019   Procedure: LEFT HEART CATH AND CORONARY ANGIOGRAPHY;  Surgeon: Wellington Hampshire, MD;  Location: [facility];  Service: Cardiovascular;  Laterality: N/A;  . TOTAL HIP ARTHROPLASTY      Prior to Admission medications   Medication Sig Start Date End Date Taking? Authorizing Provider  amLODipine (NORVASC) 10 MG tablet Take 1 tablet (10 mg total) by mouth daily. 06/23/19 06/22/20  Jennye Boroughs, MD  apixaban (ELIQUIS) 2.5 MG TABS  tablet Take 1 tablet (2.5 mg total) by mouth 2 (two) times daily. 06/23/19   Jennye Boroughs, MD  atorvastatin (LIPITOR) 40 MG tablet Take 1 tablet (40 mg total) by mouth at bedtime. 10/24/19 11/23/19  Max Sane, MD  AURYXIA 1 GM 210 MG(Fe) tablet Take 210 mg by mouth See admin instructions. Take 1 tablet (210mg ) by mouth three times daily with meals and take 1 tablet (210mg ) by mouth with snacks 06/28/19   [provider]  hydrALAZINE (APRESOLINE) 100 MG tablet Take 1 tablet (100 mg  total) by mouth 3 (three) times daily. 06/23/19   Jennye Boroughs, MD  irbesartan (AVAPRO) 75 MG tablet Take 1 tablet (75 mg total) by mouth at bedtime. 10/24/19   Max Sane, MD  isosorbide mononitrate (IMDUR) 60 MG 24 hr tablet Take 1 tablet (60 mg total) by mouth daily. 10/24/19   Max Sane, MD  ondansetron (ZOFRAN) 4 MG tablet Take 1 tablet (4 mg total) by mouth every 6 (six) hours as needed for nausea. 10/24/19   Max Sane, MD  oxyCODONE (OXY IR/ROXICODONE) 5 MG immediate release tablet Take 5-10 mg by mouth every 6 (six) hours as needed for severe pain.    [provider]  oxyCODONE (OXYCONTIN) 20 mg 12 hr tablet Take 20 mg by mouth every 12 (twelve) hours.    [provider]  pantoprazole (PROTONIX) 40 MG tablet Take 1 tablet (40 mg total) by mouth daily. 10/24/19   Max Sane, MD    Allergies  Allergen Reactions  . Cyclobenzaprine Nausea Only  . Clonidine Other (See Comments)    Asymptomatic bradycardia to 38  . Furosemide Other (See Comments)    Caused renal failure  . Tylenol [Acetaminophen] Other (See Comments)    Reaction:  Pt states it bothers his liver    History reviewed. No pertinent family history.  Social History Social History   Tobacco Use  . Smoking status: Current Every Day Smoker    Packs/day: 0.50    Types: Cigarettes  . Smokeless tobacco: Never Used  Substance Use Topics  . Alcohol use: No  . Drug use: No    Comment: pt has been positive for cocaine twice this week but denies use    Review of Systems Constitutional: Negative for fever. Cardiovascular: Positive for moderate central chest pain. Respiratory: Negative for shortness of breath. Gastrointestinal: Negative for abdominal pain Musculoskeletal: Negative for musculoskeletal complaints Neurological: Negative for headache All other ROS negative  ____________________________________________   PHYSICAL EXAM:  VITAL SIGNS: ED Triage Vitals  Enc Vitals Group     BP  11/17/19 1727 (!) 178/101     Pulse Rate 11/17/19 1727 80     Resp 11/17/19 1727 20     Temp 11/17/19 1727 98.2 F (36.8 C)     Temp src --      SpO2 11/17/19 1727 99 %     Weight 11/17/19 1728 238 lb 15.7 oz (108.4 kg)     Height 11/17/19 1728 6' (1.829 m)     Head Circumference --      Peak Flow --      Pain Score 11/17/19 1728 10     Pain Loc --      Pain Edu? --      Excl. in [place]? --     Constitutional: Alert and oriented. Well appearing and in no distress. Eyes: Normal exam ENT      Head: Normocephalic and atraumatic.      Mouth/Throat: Mucous membranes  are moist. Cardiovascular: Normal rate, regular rhythm. Respiratory: Normal respiratory effort without tachypnea nor retractions. Breath sounds are clear Gastrointestinal: Soft and nontender. No distention.  Musculoskeletal: Nontender with normal range of motion in all extremities. Neurologic:  Normal speech and language. No gross focal neurologic deficits  Skin:  Skin is warm, dry and intact.  Psychiatric: Mood and affect are normal  ____________________________________________    EKG  EKG viewed and interpreted by myself shows atrial fibrillation at 78 bpm with a narrow QRS, normal axis, normal intervals, nonspecific ST changes.  ____________________________________________    RADIOLOGY  X-ray shows cardiomegaly with mild vascular congestion  ____________________________________________   INITIAL IMPRESSION / ASSESSMENT AND PLAN / ED COURSE  Pertinent labs & imaging results that were available during my care of the patient were reviewed by me and considered in my medical decision making (see chart for details).   Patient presents to the emergency department for chest pain.  Per record review patient has a long history of chest pain, recently had a cardiac catheterization in March showing mild disease.  Apparently has been seen by cardiology several times for the same as well as the emergency department.  Is not  entirely clear the cause of the patient's chest pain.  His troponin is baseline x2 for the patient.  We will dose oral pain medication for the patient.  Patient is in atrial fibrillation this appears largely unchanged from his last visit, 1 cardiology was consulted and was thought to be safe for outpatient follow-up he is already anticoagulated remains rate controlled.  Patient's work-up today is reassuring.  We will continue to closely monitor for improvement in chest pain after medication.  Patient is requesting his home oxycodone 20 mg as well.  We will hold off.  Patient states he is ready to go home.  We will discharge patient home with cardiology follow-up.  [name] was evaluated in Emergency Department on 11/17/2019 for the symptoms described in the history of present illness. He was evaluated in the context of the global COVID-19 pandemic, which necessitated consideration that the patient might be at risk for infection with the SARS-CoV-2 virus that causes COVID-19. Institutional protocols and algorithms that pertain to the evaluation of patients at risk for COVID-19 are in a state of rapid change based on information released by regulatory bodies including the CDC and federal and state organizations. These policies and algorithms were followed during the patient's care in the ED.  ____________________________________________   FINAL CLINICAL IMPRESSION(S) / ED DIAGNOSES  Chest pain    Harvest Dark, MD 11/17/19 2232

## 2019-12-17 ENCOUNTER — Emergency Department: Payer: Medicare Other

## 2019-12-17 ENCOUNTER — Other Ambulatory Visit: Payer: Self-pay

## 2019-12-17 ENCOUNTER — Encounter: Payer: Self-pay | Admitting: Emergency Medicine

## 2019-12-17 DIAGNOSIS — F17210 Nicotine dependence, cigarettes, uncomplicated: Secondary | ICD-10-CM | POA: Diagnosis not present

## 2019-12-17 DIAGNOSIS — I132 Hypertensive heart and chronic kidney disease with heart failure and with stage 5 chronic kidney disease, or end stage renal disease: Secondary | ICD-10-CM | POA: Diagnosis not present

## 2019-12-17 DIAGNOSIS — Z79899 Other long term (current) drug therapy: Secondary | ICD-10-CM | POA: Diagnosis not present

## 2019-12-17 DIAGNOSIS — I2510 Atherosclerotic heart disease of native coronary artery without angina pectoris: Secondary | ICD-10-CM | POA: Diagnosis not present

## 2019-12-17 DIAGNOSIS — Z992 Dependence on renal dialysis: Secondary | ICD-10-CM | POA: Diagnosis not present

## 2019-12-17 DIAGNOSIS — Z7901 Long term (current) use of anticoagulants: Secondary | ICD-10-CM | POA: Diagnosis not present

## 2019-12-17 DIAGNOSIS — R0789 Other chest pain: Secondary | ICD-10-CM | POA: Diagnosis not present

## 2019-12-17 DIAGNOSIS — I5032 Chronic diastolic (congestive) heart failure: Secondary | ICD-10-CM | POA: Insufficient documentation

## 2019-12-17 DIAGNOSIS — N186 End stage renal disease: Secondary | ICD-10-CM | POA: Insufficient documentation

## 2019-12-17 LAB — CBC WITH DIFFERENTIAL/PLATELET
Abs Immature Granulocytes: 0.01 10*3/uL (ref 0.00–0.07)
Basophils Absolute: 0 10*3/uL (ref 0.0–0.1)
Basophils Relative: 1 %
Eosinophils Absolute: 0 10*3/uL (ref 0.0–0.5)
Eosinophils Relative: 1 %
HCT: 29 % — ABNORMAL LOW (ref 39.0–52.0)
Hemoglobin: 9.5 g/dL — ABNORMAL LOW (ref 13.0–17.0)
Immature Granulocytes: 0 %
Lymphocytes Relative: 24 %
Lymphs Abs: 1.1 10*3/uL (ref 0.7–4.0)
MCH: 29.7 pg (ref 26.0–34.0)
MCHC: 32.8 g/dL (ref 30.0–36.0)
MCV: 90.6 fL (ref 80.0–100.0)
Monocytes Absolute: 0.4 10*3/uL (ref 0.1–1.0)
Monocytes Relative: 9 %
Neutro Abs: 2.9 10*3/uL (ref 1.7–7.7)
Neutrophils Relative %: 65 %
Platelets: 107 10*3/uL — ABNORMAL LOW (ref 150–400)
RBC: 3.2 MIL/uL — ABNORMAL LOW (ref 4.22–5.81)
RDW: 15.5 % (ref 11.5–15.5)
WBC: 4.4 10*3/uL (ref 4.0–10.5)
nRBC: 0 % (ref 0.0–0.2)

## 2019-12-17 LAB — COMPREHENSIVE METABOLIC PANEL
ALT: 16 U/L (ref 0–44)
AST: 16 U/L (ref 15–41)
Albumin: 3.5 g/dL (ref 3.5–5.0)
Alkaline Phosphatase: 45 U/L (ref 38–126)
Anion gap: 11 (ref 5–15)
BUN: 39 mg/dL — ABNORMAL HIGH (ref 8–23)
CO2: 27 mmol/L (ref 22–32)
Calcium: 7.9 mg/dL — ABNORMAL LOW (ref 8.9–10.3)
Chloride: 103 mmol/L (ref 98–111)
Creatinine, Ser: 8.33 mg/dL — ABNORMAL HIGH (ref 0.61–1.24)
GFR calc Af Amer: 7 mL/min — ABNORMAL LOW (ref >60)
GFR calc non Af Amer: 6 mL/min — ABNORMAL LOW (ref >60)
Glucose, Bld: 129 mg/dL — ABNORMAL HIGH (ref 70–99)
Potassium: 4.4 mmol/L (ref 3.5–5.1)
Sodium: 141 mmol/L (ref 135–145)
Total Bilirubin: 1.1 mg/dL (ref 0.3–1.2)
Total Protein: 6.5 g/dL (ref 6.5–8.1)

## 2019-12-17 LAB — TROPONIN I (HIGH SENSITIVITY): Troponin I (High Sensitivity): 66 ng/L — ABNORMAL HIGH (ref <18)

## 2019-12-17 NOTE — ED Triage Notes (Signed)
Pt to triage via w/c with no distress noted, mask in place; EMS brought pt in from home for c/o mid CP radiating into back accomp by [facility] today; dialysis yesterday

## 2019-12-18 ENCOUNTER — Emergency Department
Admission: EM | Admit: 2019-12-18 | Discharge: 2019-12-18 | Disposition: A | Discharge status: Home / Self Care | Payer: Medicare Other | Attending: Emergency Medicine | Admitting: Emergency Medicine

## 2019-12-18 ENCOUNTER — Emergency Department: Payer: Medicare Other

## 2019-12-18 DIAGNOSIS — R079 Chest pain, unspecified: Secondary | ICD-10-CM

## 2019-12-18 HISTORY — DX: Disorder of kidney and ureter, unspecified: N28.9

## 2019-12-18 LAB — TROPONIN I (HIGH SENSITIVITY): Troponin I (High Sensitivity): 63 ng/L — ABNORMAL HIGH (ref <18)

## 2019-12-18 MED ORDER — OXYCODONE HCL 5 MG PO TABS
5.0000 mg | ORAL_TABLET | Freq: Once | ORAL | Status: AC
  Administered 2019-12-18: 5 mg via ORAL
  Filled 2019-12-18: qty 1

## 2019-12-18 MED ORDER — IOHEXOL 350 MG/ML SOLN
75.0000 mL | Freq: Once | INTRAVENOUS | Status: AC | PRN
  Administered 2019-12-18: 75 mL via INTRAVENOUS

## 2019-12-18 NOTE — ED Provider Notes (Signed)
CTA PE without evidence of PE.  Troponins are stable.  Patient nontoxic-appearing.  Dr. Owens Shark has coordinated with patient's dialysis to ensure that he is able to get dialysis today.  Patient will be discharged with cab voucher to dialysis center.   Merlyn Lot, MD 12/18/19 [phone]

## 2019-12-18 NOTE — ED Provider Notes (Signed)
[facility] Emergency Department Provider Note  ____________________________________________   First MD Initiated Contact with Patient 12/18/19 [phone]     (approximate)  I have reviewed the triage vital signs and the nursing notes.   HISTORY  Chief Complaint Chest Pain   HPI [name] is a 64 y.o. male with below list of previous medical conditions including end-stage renal disease for which patient receives dialysis Monday Wednesday and Friday, history of substance abuse including opiates and cocaine presents to the emergency department via EMS from home secondary to central chest pain that the patient states radiates to his back with accompanying dyspnea which began today.  Patient states that current pain score is 8 out of 10.  Patient denies any aggravating or alleviating factors for his pain.  Patient states that he did have dialysis performed on Monday however "they did not take off as much fluid as they needed to".  Patient is also requesting oxycodone as he states that he takes OxyContin/oxycodone chronically and has not been able to take it tonight.  Patient states that he receives his prescriptions from [place] and that no one here will prescribe it for him".  In addition patient states that he has not used cocaine in "months"    Past Medical History:  Diagnosis Date  . Depression   . Diastolic dysfunction    a. 09/2019 Echo: EF 55-60%, no rwma, mod LVH, gr2 DD. Nl RV size/fxn. Mildly dil LA. Ao root 4.5cm.  . Dilated aortic root ([place])    a. 09/2019 Echo: Ao root 4.5cm.  . Elevated troponin level not due myocardial infarction   . ESRD (end stage renal disease) ([place])   . Hypertension   . Nonobstructive Coronary Artery Disease    a. 09/2019 Cath: LM nl, LAD min irregs, D1 nl, D2 min irregs, LCX large, min irregs, RCA large, 20p, 30p/m.  . PE (pulmonary embolism)     Patient Active Problem List   Diagnosis Date Noted  . History of substance  abuse ([place]) 10/24/2019  . Elevated troponin 10/24/2019  . Chest pain 10/24/2019  . Chronic diastolic CHF (congestive heart failure) ([place]) 09/28/2019  . History of MI (myocardial infarction) 09/28/2019  . Anemia of chronic kidney failure, stage 5 ([place]) 09/28/2019  . History of pulmonary embolism 09/28/2019  . Chronic midline low back pain   . Acute respiratory failure with hypoxia ([place])   . Acute diastolic CHF (congestive heart failure) ([place])   . Anemia of chronic disease   . Thrombocytopenia ([place])   . Congestive heart failure ([place])   . Fluid overload 06/21/2019  . Volume overload 11/15/2018  . ESRD (end stage renal disease) on dialysis ([place]) 11/14/2018  . HCAP (healthcare-associated pneumonia) 08/27/2018  . Dyspnea 08/25/2018  . Chest pain of uncertain etiology 24/40/1027  . ESRD on dialysis ([place]) 11/08/2016  . Prostate cancer screening 11/08/2016  . Acquired cyst of kidney 11/08/2016  . Urinary urgency 11/08/2016  . Acute on chronic renal failure ([place]) 09/23/2016  . Hypertensive urgency 09/21/2016  . Dysthymia 02/26/2016  . Chronic pain 02/26/2016  . Noncompliance with renal dialysis ([place]) 02/26/2016  . Opiate abuse, continuous ([place]) 02/03/2016  . Substance induced mood disorder ([place]) 02/03/2016  . Antisocial personality disorder ([place]) 02/03/2016  . Left-sided weakness 01/18/2016  . HEPATITIS C 06/28/2008  . HLD (hyperlipidemia) 06/28/2008  . SUBSTANCE ABUSE, MULTIPLE 06/28/2008  . Hypertension 06/27/2008    Past Surgical History:  Procedure Laterality Date  . LEFT HEART CATH  AND CORONARY ANGIOGRAPHY N/A 09/30/2019   Procedure: LEFT HEART CATH AND CORONARY ANGIOGRAPHY;  Surgeon: Wellington Hampshire, MD;  Location: [facility];  Service: Cardiovascular;  Laterality: N/A;  . TOTAL HIP ARTHROPLASTY      Prior to Admission medications   Medication Sig Start Date End Date Taking? Authorizing Provider  amLODipine (NORVASC) 10 MG tablet Take 1 tablet (10 mg total) by  mouth daily. 06/23/19 06/22/20  Jennye Boroughs, MD  apixaban (ELIQUIS) 2.5 MG TABS tablet Take 1 tablet (2.5 mg total) by mouth 2 (two) times daily. 06/23/19   Jennye Boroughs, MD  atorvastatin (LIPITOR) 40 MG tablet Take 1 tablet (40 mg total) by mouth at bedtime. 10/24/19 11/23/19  Max Sane, MD  AURYXIA 1 GM 210 MG(Fe) tablet Take 210 mg by mouth See admin instructions. Take 1 tablet (210mg ) by mouth three times daily with meals and take 1 tablet (210mg ) by mouth with snacks 06/28/19   [provider]  hydrALAZINE (APRESOLINE) 100 MG tablet Take 1 tablet (100 mg total) by mouth 3 (three) times daily. 06/23/19   Jennye Boroughs, MD  irbesartan (AVAPRO) 75 MG tablet Take 1 tablet (75 mg total) by mouth at bedtime. 10/24/19   Max Sane, MD  isosorbide mononitrate (IMDUR) 60 MG 24 hr tablet Take 1 tablet (60 mg total) by mouth daily. 10/24/19   Max Sane, MD  ondansetron (ZOFRAN) 4 MG tablet Take 1 tablet (4 mg total) by mouth every 6 (six) hours as needed for nausea. 10/24/19   Max Sane, MD  oxyCODONE (OXY IR/ROXICODONE) 5 MG immediate release tablet Take 5-10 mg by mouth every 6 (six) hours as needed for severe pain.    [provider]  oxyCODONE (OXYCONTIN) 20 mg 12 hr tablet Take 20 mg by mouth every 12 (twelve) hours.    [provider]  pantoprazole (PROTONIX) 40 MG tablet Take 1 tablet (40 mg total) by mouth daily. 10/24/19   Max Sane, MD    Allergies Cyclobenzaprine, Clonidine, Furosemide, and Tylenol [acetaminophen]  No family history on file.  Social History Social History   Tobacco Use  . Smoking status: Current Every Day Smoker    Packs/day: 0.50    Types: Cigarettes  . Smokeless tobacco: Never Used  Substance Use Topics  . Alcohol use: No  . Drug use: No    Comment: pt has been positive for cocaine twice this week but denies use    Review of Systems Constitutional: No fever/chills Eyes: No visual changes. ENT: No sore  throat. Cardiovascular: Positive for chest pain. Respiratory: Positive for shortness of breath. Gastrointestinal: No abdominal pain.  No nausea, no vomiting.  No diarrhea.  No constipation. Genitourinary: Negative for dysuria. Musculoskeletal: Negative for neck pain.  Negative for back pain. Integumentary: Negative for rash. Neurological: Negative for headaches, focal weakness or numbness.  ____________________________________________   PHYSICAL EXAM:  VITAL SIGNS: ED Triage Vitals [12/17/19 2254]  Enc Vitals Group     BP (!) 157/94     Pulse Rate 76     Resp 18     Temp 97.9 F (36.6 C)     Temp Source Oral     SpO2 100 %     Weight 106.6 kg (235 lb)     Height 1.829 m (6')     Head Circumference      Peak Flow      Pain Score 8     Pain Loc      Pain Edu?  Excl. in [place]?     Constitutional: Alert and oriented.  Eyes: Conjunctivae are normal.  Mouth/Throat: Patient is wearing a mask. Neck: No stridor.  No meningeal signs.   Cardiovascular: Normal rate, regular rhythm. Good peripheral circulation. Grossly normal heart sounds. Respiratory: Normal respiratory effort.  No retractions. Gastrointestinal: Soft and nontender. No distention.  Musculoskeletal: No lower extremity tenderness nor edema. No gross deformities of extremities. Neurologic:  Normal speech and language. No gross focal neurologic deficits are appreciated.  Skin:  Skin is warm, dry and intact. Psychiatric: Mood and affect are normal. Speech and behavior are normal.  ____________________________________________   LABS (all labs ordered are listed, but only abnormal results are displayed)  Labs Reviewed  CBC WITH DIFFERENTIAL/PLATELET - Abnormal; Notable for the following components:      Result Value   RBC 3.20 (*)    Hemoglobin 9.5 (*)    HCT 29.0 (*)    Platelets 107 (*)    All other components within normal limits  COMPREHENSIVE METABOLIC PANEL - Abnormal; Notable for the following  components:   Glucose, Bld 129 (*)    BUN 39 (*)    Creatinine, Ser 8.33 (*)    Calcium 7.9 (*)    GFR calc non Af Amer 6 (*)    GFR calc Af Amer 7 (*)    All other components within normal limits  TROPONIN I (HIGH SENSITIVITY) - Abnormal; Notable for the following components:   Troponin I (High Sensitivity) 66 (*)    All other components within normal limits  TROPONIN I (HIGH SENSITIVITY) - Abnormal; Notable for the following components:   Troponin I (High Sensitivity) 63 (*)    All other components within normal limits   ____________________________________________  EKG  ED ECG REPORT I, [place] N Stephene Alegria, the attending physician, personally viewed and interpreted this ECG.   Date: 12/18/2019  EKG Time: 10:53 PM  Rate: 79  Rhythm: Atrial fibrillation  Axis: Normal  Intervals: Normal  ST&T Change: None  ____________________________________________  RADIOLOGY I, [place] N Viyaan Champine, personally viewed and evaluated these images (plain radiographs) as part of my medical decision making, as well as reviewing the written report by the radiologist.  ED MD interpretation: Stable enlarged cardiac silhouette and thoracic aortic ectasia no acute intrathoracic process on chest x-ray per radiologist  Official radiology report(s): DG Chest 2 View  Result Date: 12/17/2019 CLINICAL DATA:  Mid chest pain radiating to back, shortness of breath EXAM: CHEST - 2 VIEW COMPARISON:  11/17/2019 FINDINGS: Frontal and lateral views of the chest demonstrates stable enlargement of the cardiac silhouette. Thoracic aorta is ectatic. No airspace disease, effusion, or pneumothorax. No acute bony abnormalities. IMPRESSION: 1. Stable enlarged cardiac silhouette and thoracic aortic ectasia. 2. No acute intrathoracic process. Electronically Signed   By: Randa Ngo M.D.   On: 12/17/2019 23:20     Procedures   ____________________________________________   INITIAL IMPRESSION / MDM / [place] /  ED COURSE  As part of my medical decision making, I reviewed the following data within the electronic MEDICAL RECORD NUMBER  64 year old male presented with above-stated history and physical exam a differential diagnosis including but not limited to ACS, pulmonary emboli, volume overload.  EKG revealed no evidence of ischemia or infarction.  Laboratory data revealed troponin of 66 and then subsequently 63 with previously persistently elevated troponin most recent 11/17/2019 which was 110 at that time.  Chest x-ray revealed no evidence of pulmonary edema.  Given presenting complaint and previous  history of pulmonary emboli CT angio of the chest will be performed   6:40 AM: I spoke with representative from [facility] that we would give the patient a cab voucher to take him to dialysis after his evaluation the emergency department was completed.  The representative from the dialysis center assured me that the patient will be dialyzed upon arrival to [place].  This was done because the patient states that he has transportation issues and would be unable to get to dialysis today. ____________________________________________  FINAL CLINICAL IMPRESSION(S) / ED DIAGNOSES  Final diagnoses:  Chest pain, unspecified type     MEDICATIONS GIVEN DURING THIS VISIT:  Medications  oxyCODONE (Oxy IR/ROXICODONE) immediate release tablet 5 mg (5 mg Oral Given 12/18/19 0445)     ED Discharge Orders    None      *Please note:  TRACY KINNER was evaluated in Emergency Department on 12/18/2019 for the symptoms described in the history of present illness. He was evaluated in the context of the global COVID-19 pandemic, which necessitated consideration that the patient might be at risk for infection with the SARS-CoV-2 virus that causes COVID-19. Institutional protocols and algorithms that pertain to the evaluation of patients at risk for COVID-19 are in a state of rapid change based on information  released by regulatory bodies including the CDC and federal and state organizations. These policies and algorithms were followed during the patient's care in the ED.  Some ED evaluations and interventions may be delayed as a result of limited staffing during the pandemic.*  Note:  This document was prepared using Dragon voice recognition software and may include unintentional dictation errors.   Gregor Hams, MD 12/19/19 [phone]

## 2019-12-18 NOTE — Discharge Instructions (Addendum)
Go directly to Dialysis center.

## 2019-12-18 NOTE — ED Notes (Signed)
Taxi voucher signed by charge Games developer. [employer] services notified.

## 2019-12-18 NOTE — ED Notes (Signed)
Pt off oxygen for room air sat. Pt remaining at 100% with no oxygen therapy.

## 2019-12-18 NOTE — ED Notes (Signed)
This RN called and spoke with Elta Guadeloupe RN to probide a status update.  [facility] dialysis.

## 2020-01-02 ENCOUNTER — Emergency Department: Payer: Medicare Other

## 2020-01-02 ENCOUNTER — Other Ambulatory Visit: Payer: Self-pay

## 2020-01-02 ENCOUNTER — Emergency Department
Admission: EM | Admit: 2020-01-02 | Discharge: 2020-01-03 | Disposition: A | Discharge status: Home / Self Care | Payer: Medicare Other | Attending: Emergency Medicine | Admitting: Emergency Medicine

## 2020-01-02 DIAGNOSIS — J9601 Acute respiratory failure with hypoxia: Secondary | ICD-10-CM | POA: Diagnosis not present

## 2020-01-02 DIAGNOSIS — Z79899 Other long term (current) drug therapy: Secondary | ICD-10-CM | POA: Insufficient documentation

## 2020-01-02 DIAGNOSIS — Z7901 Long term (current) use of anticoagulants: Secondary | ICD-10-CM | POA: Diagnosis not present

## 2020-01-02 DIAGNOSIS — I77811 Abdominal aortic ectasia: Secondary | ICD-10-CM | POA: Diagnosis not present

## 2020-01-02 DIAGNOSIS — F17210 Nicotine dependence, cigarettes, uncomplicated: Secondary | ICD-10-CM | POA: Insufficient documentation

## 2020-01-02 DIAGNOSIS — E8770 Fluid overload, unspecified: Secondary | ICD-10-CM | POA: Diagnosis not present

## 2020-01-02 DIAGNOSIS — R079 Chest pain, unspecified: Secondary | ICD-10-CM | POA: Diagnosis present

## 2020-01-02 DIAGNOSIS — N186 End stage renal disease: Secondary | ICD-10-CM | POA: Diagnosis not present

## 2020-01-02 DIAGNOSIS — I1311 Hypertensive heart and chronic kidney disease without heart failure, with stage 5 chronic kidney disease, or end stage renal disease: Secondary | ICD-10-CM | POA: Insufficient documentation

## 2020-01-02 DIAGNOSIS — J40 Bronchitis, not specified as acute or chronic: Secondary | ICD-10-CM | POA: Insufficient documentation

## 2020-01-02 DIAGNOSIS — I5032 Chronic diastolic (congestive) heart failure: Secondary | ICD-10-CM | POA: Insufficient documentation

## 2020-01-02 DIAGNOSIS — Z20822 Contact with and (suspected) exposure to covid-19: Secondary | ICD-10-CM | POA: Insufficient documentation

## 2020-01-02 LAB — TROPONIN I (HIGH SENSITIVITY)
Troponin I (High Sensitivity): 72 ng/L — ABNORMAL HIGH (ref <18)
Troponin I (High Sensitivity): 73 ng/L — ABNORMAL HIGH (ref <18)

## 2020-01-02 LAB — HEPATIC FUNCTION PANEL
ALT: 16 U/L (ref 0–44)
AST: 16 U/L (ref 15–41)
Albumin: 3.7 g/dL (ref 3.5–5.0)
Alkaline Phosphatase: 48 U/L (ref 38–126)
Bilirubin, Direct: 0.3 mg/dL — ABNORMAL HIGH (ref 0.0–0.2)
Indirect Bilirubin: 0.8 mg/dL (ref 0.3–0.9)
Total Bilirubin: 1.1 mg/dL (ref 0.3–1.2)
Total Protein: 6.6 g/dL (ref 6.5–8.1)

## 2020-01-02 LAB — BASIC METABOLIC PANEL
Anion gap: 11 (ref 5–15)
BUN: 30 mg/dL — ABNORMAL HIGH (ref 8–23)
CO2: 27 mmol/L (ref 22–32)
Calcium: 8.8 mg/dL — ABNORMAL LOW (ref 8.9–10.3)
Chloride: 100 mmol/L (ref 98–111)
Creatinine, Ser: 6.97 mg/dL — ABNORMAL HIGH (ref 0.61–1.24)
GFR calc Af Amer: 9 mL/min — ABNORMAL LOW (ref >60)
GFR calc non Af Amer: 8 mL/min — ABNORMAL LOW (ref >60)
Glucose, Bld: 98 mg/dL (ref 70–99)
Potassium: 4 mmol/L (ref 3.5–5.1)
Sodium: 138 mmol/L (ref 135–145)

## 2020-01-02 LAB — CBC
HCT: 32.3 % — ABNORMAL LOW (ref 39.0–52.0)
Hemoglobin: 10.7 g/dL — ABNORMAL LOW (ref 13.0–17.0)
MCH: 29.6 pg (ref 26.0–34.0)
MCHC: 33.1 g/dL (ref 30.0–36.0)
MCV: 89.5 fL (ref 80.0–100.0)
Platelets: 81 10*3/uL — ABNORMAL LOW (ref 150–400)
RBC: 3.61 MIL/uL — ABNORMAL LOW (ref 4.22–5.81)
RDW: 15.8 % — ABNORMAL HIGH (ref 11.5–15.5)
WBC: 3.2 10*3/uL — ABNORMAL LOW (ref 4.0–10.5)
nRBC: 0 % (ref 0.0–0.2)

## 2020-01-02 LAB — LIPASE, BLOOD: Lipase: 19 U/L (ref 11–51)

## 2020-01-02 MED ORDER — IOHEXOL 350 MG/ML SOLN
100.0000 mL | Freq: Once | INTRAVENOUS | Status: AC | PRN
  Administered 2020-01-02: 100 mL via INTRAVENOUS

## 2020-01-02 MED ORDER — DOXYCYCLINE MONOHYDRATE 100 MG PO TABS: 100.0000 mg | ORAL_TABLET | Freq: Two times a day (BID) | ORAL | 0 refills | Status: DC

## 2020-01-02 MED ORDER — IPRATROPIUM-ALBUTEROL 0.5-2.5 (3) MG/3ML IN SOLN
3.0000 mL | Freq: Once | RESPIRATORY_TRACT | Status: AC
  Administered 2020-01-02: 3 mL via RESPIRATORY_TRACT
  Filled 2020-01-02: qty 3

## 2020-01-02 MED ORDER — METHYLPREDNISOLONE SODIUM SUCC 125 MG IJ SOLR
125.0000 mg | Freq: Once | INTRAMUSCULAR | Status: AC
  Administered 2020-01-02: 125 mg via INTRAVENOUS
  Filled 2020-01-02: qty 2

## 2020-01-02 MED ORDER — ALBUTEROL SULFATE HFA 108 (90 BASE) MCG/ACT IN AERS
2.0000 | INHALATION_SPRAY | Freq: Four times a day (QID) | RESPIRATORY_TRACT | 0 refills | Status: DC | PRN
Start: 2020-01-02 — End: 2020-01-16

## 2020-01-02 MED ORDER — SODIUM CHLORIDE 0.9% FLUSH
3.0000 mL | Freq: Once | INTRAVENOUS | Status: AC
  Administered 2020-01-02: 3 mL via INTRAVENOUS

## 2020-01-02 MED ORDER — PREDNISONE 20 MG PO TABS: 40.0000 mg | ORAL_TABLET | Freq: Every day | ORAL | 0 refills | Status: AC

## 2020-01-02 MED ORDER — ONDANSETRON HCL 4 MG/2ML IJ SOLN
4.0000 mg | Freq: Once | INTRAMUSCULAR | Status: AC
  Administered 2020-01-02: 4 mg via INTRAVENOUS
  Filled 2020-01-02: qty 2

## 2020-01-02 MED ORDER — HYDROMORPHONE HCL 1 MG/ML IJ SOLN
0.5000 mg | Freq: Once | INTRAMUSCULAR | Status: AC
  Administered 2020-01-02: 0.5 mg via INTRAVENOUS
  Filled 2020-01-02: qty 1

## 2020-01-02 NOTE — ED Triage Notes (Addendum)
Pt comes via POV from home with c/o SOB, CP and cough that started 3 days ago. Pt states bad cough and he thinks he has pneumonia.  Pt states mid sternal CP. Pt states N/V and unable to tolerate intake.  Pt is dialysis pt. Pt denies missing any treatments.

## 2020-01-02 NOTE — Discharge Instructions (Addendum)
Go to your dialysis this morning. Follow up with your doctor in 2 days.  Use the inhaler as needed for shortness of breath.  Take the antibiotics and steroids as prescribed.  Your CT had an incidental finding of dilated aorta in your abdomen. It is important that you have a repeat ultrasound of your abdomen within 5 years to follow this up with.  Make sure to discuss with your primary care doctor as he or she can order this test for you.  Return to the emergency room for new or worsening shortness of breath, chest pain, dizziness, or fever.

## 2020-01-02 NOTE — ED Provider Notes (Signed)
[facility] Emergency Department Provider Note  ____________________________________________   First MD Initiated Contact with Patient 01/02/20 2208     (approximate)  I have reviewed the triage vital signs and the nursing notes.   HISTORY  Chief Complaint Shortness of Breath and Chest Pain    HPI [name] is a 64 y.o. male with diastolic heart dysfunction, ESRD, A. fib nonobstructing coronary disease on catheterization in March 2021, PE on Eliquis who comes in with chest pain.  Patient on review of records was just seen on 6/9.  Troponins were stable.  CT PE was negative for pulmonary embolism.  Patient's been seen multiple times for chest pain.  This time he states the chest pain radiates down into his abdomen is severe, constant, nothing makes better, nothing makes it worse.  Also reports having left sinus congestion and congestion in his lungs.. He states he is has a very thick productive cough and he is worried he might have pneumonia.  He denies any fevers.  He has been compliant with his Eliquis.  Denies missing dialysis.  Next session is Friday.  Last session was on Wednesday          Past Medical History:  Diagnosis Date  . Depression   . Diastolic dysfunction    a. 09/2019 Echo: EF 55-60%, no rwma, mod LVH, gr2 DD. Nl RV size/fxn. Mildly dil LA. Ao root 4.5cm.  . Dilated aortic root ([place])    a. 09/2019 Echo: Ao root 4.5cm.  . Elevated troponin level not due myocardial infarction   . ESRD (end stage renal disease) ([place])   . Hypertension   . Nonobstructive Coronary Artery Disease    a. 09/2019 Cath: LM nl, LAD min irregs, D1 nl, D2 min irregs, LCX large, min irregs, RCA large, 20p, 30p/m.  . PE (pulmonary embolism)   . Renal insufficiency     Patient Active Problem List   Diagnosis Date Noted  . History of substance abuse ([place]) 10/24/2019  . Elevated troponin 10/24/2019  . Chest pain 10/24/2019  . Chronic diastolic CHF (congestive  heart failure) ([place]) 09/28/2019  . History of MI (myocardial infarction) 09/28/2019  . Anemia of chronic kidney failure, stage 5 ([place]) 09/28/2019  . History of pulmonary embolism 09/28/2019  . Chronic midline low back pain   . Acute respiratory failure with hypoxia ([place])   . Acute diastolic CHF (congestive heart failure) ([place])   . Anemia of chronic disease   . Thrombocytopenia ([place])   . Congestive heart failure ([place])   . Fluid overload 06/21/2019  . Volume overload 11/15/2018  . ESRD (end stage renal disease) on dialysis ([place]) 11/14/2018  . HCAP (healthcare-associated pneumonia) 08/27/2018  . Dyspnea 08/25/2018  . Chest pain of uncertain etiology 27/09/5007  . ESRD on dialysis ([place]) 11/08/2016  . Prostate cancer screening 11/08/2016  . Acquired cyst of kidney 11/08/2016  . Urinary urgency 11/08/2016  . Acute on chronic renal failure ([place]) 09/23/2016  . Hypertensive urgency 09/21/2016  . Dysthymia 02/26/2016  . Chronic pain 02/26/2016  . Noncompliance with renal dialysis ([place]) 02/26/2016  . Opiate abuse, continuous ([place]) 02/03/2016  . Substance induced mood disorder ([place]) 02/03/2016  . Antisocial personality disorder ([place]) 02/03/2016  . Left-sided weakness 01/18/2016  . HEPATITIS C 06/28/2008  . HLD (hyperlipidemia) 06/28/2008  . SUBSTANCE ABUSE, MULTIPLE 06/28/2008  . Hypertension 06/27/2008    Past Surgical History:  Procedure Laterality Date  . LEFT HEART CATH AND CORONARY ANGIOGRAPHY N/A 09/30/2019  Procedure: LEFT HEART CATH AND CORONARY ANGIOGRAPHY;  Surgeon: Wellington Hampshire, MD;  Location: [facility] [facility];  Service: Cardiovascular;  Laterality: N/A;  . TOTAL HIP ARTHROPLASTY      Prior to Admission medications   Medication Sig Start Date End Date Taking? Authorizing Provider  amLODipine (NORVASC) 10 MG tablet Take 1 tablet (10 mg total) by mouth daily. 06/23/19 06/22/20  Jennye Boroughs, MD  apixaban (ELIQUIS) 2.5 MG TABS tablet Take 1 tablet (2.5 mg total) by  mouth 2 (two) times daily. 06/23/19   Jennye Boroughs, MD  atorvastatin (LIPITOR) 40 MG tablet Take 1 tablet (40 mg total) by mouth at bedtime. 10/24/19 11/23/19  Max Sane, MD  AURYXIA 1 GM 210 MG(Fe) tablet Take 210 mg by mouth See admin instructions. Take 1 tablet (210mg ) by mouth three times daily with meals and take 1 tablet (210mg ) by mouth with snacks 06/28/19   [provider]  hydrALAZINE (APRESOLINE) 100 MG tablet Take 1 tablet (100 mg total) by mouth 3 (three) times daily. 06/23/19   Jennye Boroughs, MD  irbesartan (AVAPRO) 75 MG tablet Take 1 tablet (75 mg total) by mouth at bedtime. 10/24/19   Max Sane, MD  isosorbide mononitrate (IMDUR) 60 MG 24 hr tablet Take 1 tablet (60 mg total) by mouth daily. 10/24/19   Max Sane, MD  ondansetron (ZOFRAN) 4 MG tablet Take 1 tablet (4 mg total) by mouth every 6 (six) hours as needed for nausea. 10/24/19   Max Sane, MD  oxyCODONE (OXY IR/ROXICODONE) 5 MG immediate release tablet Take 5-10 mg by mouth every 6 (six) hours as needed for severe pain.    [provider]  oxyCODONE (OXYCONTIN) 20 mg 12 hr tablet Take 20 mg by mouth every 12 (twelve) hours.    [provider]  pantoprazole (PROTONIX) 40 MG tablet Take 1 tablet (40 mg total) by mouth daily. 10/24/19   Max Sane, MD    Allergies Cyclobenzaprine, Clonidine, Furosemide, and Tylenol [acetaminophen]  No family history on file.  Social History Social History   Tobacco Use  . Smoking status: Current Every Day Smoker    Packs/day: 0.50    Types: Cigarettes  . Smokeless tobacco: Never Used  Substance Use Topics  . Alcohol use: No  . Drug use: No      Review of Systems Constitutional: No fever/chills Eyes: No visual changes. ENT: No sore throat. Cardiovascular: Positive chest pain Respiratory: Positive shortness of breath, cough, congestion Gastrointestinal: Positive abdominal pain no nausea, no vomiting.  No diarrhea.  No  constipation. Genitourinary: Negative for dysuria. Musculoskeletal: Negative for back pain. Skin: Negative for rash. Neurological: Negative for headaches, focal weakness or numbness. All other ROS negative ____________________________________________   PHYSICAL EXAM:  VITAL SIGNS: ED Triage Vitals  Enc Vitals Group     BP 01/02/20 1601 (!) 187/93     Pulse Rate 01/02/20 1601 88     Resp 01/02/20 1601 18     Temp 01/02/20 1601 98.3 F (36.8 C)     Temp src --      SpO2 01/02/20 1601 100 %     Weight 01/02/20 1603 235 lb (106.6 kg)     Height 01/02/20 1603 6' (1.829 m)     Head Circumference --      Peak Flow --      Pain Score 01/02/20 1603 8     Pain Loc --      Pain Edu? --      Excl.  in [place]? --     Constitutional: Alert and oriented. Well appearing and in no acute distress. Eyes: Conjunctivae are normal. EOMI. Head: Atraumatic.  Sinus tenderness Nose: No congestion/rhinnorhea. Mouth/Throat: Mucous membranes are moist.   Neck: No stridor. Trachea Midline. FROM Cardiovascular: Normal rate, regular rhythm. Grossly normal heart sounds.  Good peripheral circulation. Respiratory: Normal respiratory effort.  No retractions.  Bilateral wheezing with coughing and congestion Gastrointestinal: Tenderness in the abdomen. No distention. No abdominal bruits.  Musculoskeletal: No lower extremity tenderness nor edema.  No joint effusions. Fistula noted with thrill Neurologic:  Normal speech and language. No gross focal neurologic deficits are appreciated.  Skin:  Skin is warm, dry and intact. No rash noted. Psychiatric: Mood and affect are normal. Speech and behavior are normal. GU: Deferred   ____________________________________________   LABS (all labs ordered are listed, but only abnormal results are displayed)  Labs Reviewed  BASIC METABOLIC PANEL - Abnormal; Notable for the following components:      Result Value   BUN 30 (*)    Creatinine, Ser 6.97 (*)    Calcium 8.8  (*)    GFR calc non Af Amer 8 (*)    GFR calc Af Amer 9 (*)    All other components within normal limits  CBC - Abnormal; Notable for the following components:   WBC 3.2 (*)    RBC 3.61 (*)    Hemoglobin 10.7 (*)    HCT 32.3 (*)    RDW 15.8 (*)    Platelets 81 (*)    All other components within normal limits  TROPONIN I (HIGH SENSITIVITY) - Abnormal; Notable for the following components:   Troponin I (High Sensitivity) 72 (*)    All other components within normal limits  TROPONIN I (HIGH SENSITIVITY) - Abnormal; Notable for the following components:   Troponin I (High Sensitivity) 73 (*)    All other components within normal limits  SARS CORONAVIRUS 2 BY RT PCR (HOSPITAL ORDER, [facility])   ____________________________________________   ED ECG REPORT I, Vanessa Burrton, the attending physician, personally viewed and interpreted this ECG.  Atrial fibrillation rate of 94, no ST elevation, T wave version aVL, QTC slightly prolonged 505 ____________________________________________  RADIOLOGY Robert Bellow, personally viewed and evaluated these images (plain radiographs) as part of my medical decision making, as well as reviewing the written report by the radiologist.  ED MD interpretation: Mild pulmonary vascular congestion  Official radiology report(s): DG Chest 2 View  Result Date: 01/02/2020 CLINICAL DATA:  Chest pain and shortness of breath. EXAM: CHEST - 2 VIEW COMPARISON:  December 17, 2019 FINDINGS: Mild diffusely increased interstitial lung markings are seen with mild prominence of the pulmonary vasculature. There is no evidence of a pleural effusion or pneumothorax. The cardiac silhouette is moderately enlarged. The visualized skeletal structures are unremarkable. IMPRESSION: 1. Moderate cardiomegaly with mild pulmonary vascular congestion. Electronically Signed   By: Virgina Norfolk M.D.   On: 01/02/2020 16:24     ____________________________________________   PROCEDURES  Procedure(s) performed (including Critical Care):  Procedures   ____________________________________________   INITIAL IMPRESSION / ASSESSMENT AND PLAN / ED COURSE   THURSTON BRENDLINGER was evaluated in Emergency Department on 01/02/2020 for the symptoms described in the history of present illness. He was evaluated in the context of the global COVID-19 pandemic, which necessitated consideration that the patient might be at risk for infection with the SARS-CoV-2 virus that causes COVID-19. Institutional protocols  and algorithms that pertain to the evaluation of patients at risk for COVID-19 are in a state of rapid change based on information released by regulatory bodies including the CDC and federal and state organizations. These policies and algorithms were followed during the patient's care in the ED.    Most Likely DDx:  -I have low suspicion for ACS given he has had recent negative cath.  However given the chest pain radiates down into his abdomen consider the possibility of dissection given he is hypertensive.  Will get CT scan to rule out dissection and to evaluate for other acute abdominal process given he does report some pain in his abdomen as well.  Patient does have some wheezing on examination and will get Covid swab.  The CT scan should be done to visualize his lungs as well.  The wheezing could be secondary to his pulmonary vascular congestion although with his cough and congestion could be a bronchitis-like picture.  We will give him some duo nebs and steroids and see if that helps improve his symptoms.   DDx that was also considered d/t potential to cause harm, but was found less likely based on history and physical (as detailed above): -PNA (no fevers, cough but CXR to evaluate) -PNX (reassured with equal b/l breath sounds, CXR to evaluate) -Symptomatic anemia (will get H&H) -Pulmonary embolism as no sob at rest,  not pleuritic in nature, no hypoxia -Aortic Dissection as no tearing pain and no radiation to the mid back, pulses equal -Pericarditis no rub on exam, EKG changes or hx to suggest dx -Tamponade (no notable SOB, tachycardic, hypotensive) -Esophageal rupture (no h/o diffuse vomitting/no crepitus)   Patient is off to oncoming team pending CT scan.  If CT is negative we will send patient home on treatment for bronchitis given cough, congestion.        ____________________________________________   FINAL CLINICAL IMPRESSION(S) / ED DIAGNOSES   Final diagnoses:  Bronchitis  Ectatic abdominal aorta (HCC)  Hypervolemia, unspecified hypervolemia type     MEDICATIONS GIVEN DURING THIS VISIT:  Medications  sodium chloride flush (NS) 0.9 % injection 3 mL (3 mLs Intravenous Given 01/02/20 2250)  ipratropium-albuterol (DUONEB) 0.5-2.5 (3) MG/3ML nebulizer solution 3 mL (3 mLs Nebulization Given 01/02/20 2245)  ipratropium-albuterol (DUONEB) 0.5-2.5 (3) MG/3ML nebulizer solution 3 mL (3 mLs Nebulization Given 01/02/20 2244)  ipratropium-albuterol (DUONEB) 0.5-2.5 (3) MG/3ML nebulizer solution 3 mL (3 mLs Nebulization Given 01/02/20 2243)  methylPREDNISolone sodium succinate (SOLU-MEDROL) 125 mg/2 mL injection 125 mg (125 mg Intravenous Given 01/02/20 2246)  HYDROmorphone (DILAUDID) injection 0.5 mg (0.5 mg Intravenous Given 01/02/20 2242)  ondansetron (ZOFRAN) injection 4 mg (4 mg Intravenous Given 01/02/20 2241)  iohexol (OMNIPAQUE) 350 MG/ML injection 100 mL (100 mLs Intravenous Contrast Given 01/02/20 2341)     ED Discharge Orders         Ordered    albuterol (VENTOLIN HFA) 108 (90 Base) MCG/ACT inhaler  Every 6 hours PRN     Discontinue  Reprint     01/02/20 2329    predniSONE (DELTASONE) 20 MG tablet  Daily     Discontinue  Reprint     01/02/20 2329    doxycycline (ADOXA) 100 MG tablet  2 times daily     Discontinue  Reprint     01/02/20 2329           Note:  This document was  prepared using Dragon voice recognition software and may include unintentional dictation errors.   Marjean Donna  E, MD 01/03/20 1200

## 2020-01-02 NOTE — ED Notes (Signed)
Repeat VS obtained by this RN. Pt continues to sit in subwait. No needs voiced by patient at this time.

## 2020-01-03 LAB — SARS CORONAVIRUS 2 BY RT PCR (HOSPITAL ORDER, PERFORMED IN ~~LOC~~ HOSPITAL LAB): SARS Coronavirus 2: NEGATIVE

## 2020-01-03 NOTE — ED Provider Notes (Signed)
I was asked to follow up the CT angio of this patient to rule out dissection as possible cause of his symptoms. CT negative for PE or dissection.  CT shows stable known ascending thoracic aneurysm of 4 cm with no acute aortic abnormality.  Also an incidental finding of an ectatic abdominal aorta measuring 2.9 cm.  Discussed this finding with patient and recommendation for an abdominal ultrasound in 5 years.  Patient has no abdominal pain or tenderness on palpation.  He was ambulated with no hypoxia.  Remains otherwise stable.  Plan was to discharge home on albuterol, doxycycline and prednisone for bronchitis.  Prescriptions have been sent by Dr. Jari Pigg.  Patient is scheduled for dialysis this morning.  Recommended not missing his treatment this morning his CT is consistent with volume overload which is probably worsening his respiratory complaints.  Discussed my standard return precautions   I have personally reviewed the images performed during this visit and I agree with the Radiologist's read.   Interpretation by Radiologist:  DG Chest 2 View  Result Date: 01/02/2020 CLINICAL DATA:  Chest pain and shortness of breath. EXAM: CHEST - 2 VIEW COMPARISON:  December 17, 2019 FINDINGS: Mild diffusely increased interstitial lung markings are seen with mild prominence of the pulmonary vasculature. There is no evidence of a pleural effusion or pneumothorax. The cardiac silhouette is moderately enlarged. The visualized skeletal structures are unremarkable. IMPRESSION: 1. Moderate cardiomegaly with mild pulmonary vascular congestion. Electronically Signed   By: Virgina Norfolk M.D.   On: 01/02/2020 16:24   CT Angio Chest/Abd/Pel for Dissection W and/or Wo Contrast  Result Date: 01/03/2020 CLINICAL DATA:  Chest pain and shortness of breath.  Cough. EXAM: CT ANGIOGRAPHY CHEST, ABDOMEN AND PELVIS TECHNIQUE: Non-contrast CT of the chest was initially obtained. Multidetector CT imaging through the chest, abdomen and  pelvis was performed using the standard protocol during bolus administration of intravenous contrast. Multiplanar reconstructed images and MIPs were obtained and reviewed to evaluate the vascular anatomy. CONTRAST:  131mL OMNIPAQUE IOHEXOL 350 MG/ML SOLN COMPARISON:  Radiograph earlier today. Chest CTA PE protocol 2 weeks ago 12/18/2019 FINDINGS: CTA CHEST FINDINGS Cardiovascular: Stable aneurysmal dilatation of the ascending aorta at 4 cm, series 7, image 67. There is no evidence of dissection, hematoma, or acute aortic syndrome. Mild aortic atherosclerosis. Conventional branching pattern from the aortic arch with patent branch vessels. Branch vessels are tortuous indicating hypertension. Cardiomegaly is similar to prior exam. Physiologic pericardial fluid which tracks into the superior pericardial recess. There are coronary artery calcifications. There is venous distension of the upper extremities. SVC is widely patent. No filling defects in the pulmonary arteries to the segmental level. Mediastinum/Nodes: Scattered small mediastinal and hilar lymph nodes that are not enlarged by size criteria. No esophageal wall thickening. No thyroid nodule. Lungs/Pleura: Emphysema and unchanged bronchial thickening. No acute airspace disease. No pulmonary edema, pleural effusion, or pulmonary mass. Small subpleural bleb at the left lung base. Musculoskeletal: There are no acute or suspicious osseous abnormalities. Review of the MIP images confirms the above findings. CTA ABDOMEN AND PELVIS FINDINGS VASCULAR Aorta: Moderate atherosclerosis. No dissection or acute findings. The infrarenal aorta is ectatic, 2.9 cm greatest dimension, series 7, image 77. No significant stenosis. Celiac: Patent without evidence of aneurysm, dissection, vasculitis or significant stenosis. SMA: Patent without evidence of aneurysm, dissection, vasculitis or significant stenosis. Renals: Plaque at the origin of both renal arteries with moderate  stenosis, right greater than left. No acute findings. IMA: Patent without evidence of aneurysm,  dissection, vasculitis or significant stenosis. Inflow: Mild ectasia of the proximal left common iliac artery. Otherwise normal in caliber. Moderately calcified. No dissection or severe stenosis. No acute findings. Veins: No obvious venous abnormality within the limitations of this arterial phase study. Review of the MIP images confirms the above findings. NON-VASCULAR Hepatobiliary: No focal hepatic abnormality. No calcified gallstone or biliary dilatation. Pancreas: Motion artifact through the proximal pancreas, equivocal wall peripancreatic edema versus motion. There is no ductal dilatation. Spleen: Enlarged spanning 15 cm cranial caudal. Adrenals/Urinary Tract: Low-density left adrenal nodularity consistent with adenoma. No evidence of right adrenal nodule. Chronically atrophic kidneys consistent with chronic renal disease. Mild perinephric edema about both kidneys. No hydronephrosis. Bladder is mildly distended. Stomach/Bowel: Colonic diverticulosis without diverticulitis. Sigmoid colon is tortuous. There is no bowel obstruction or inflammatory change. Normal appendix. Stomach is unremarkable. Lymphatic: Scattered prominent retroperitoneal nodes are likely reactive. Reproductive: Prostate is unremarkable. Other: Stranding in the upper retroperitoneum, grossly similar to prior, partially obscured by motion. Prominent generalized subcutaneous edema and skin thickening. No significant ascites. No free air. Musculoskeletal: Degenerative change in the lower lumbar spine. Left hip arthroplasty. 1There are no acute or suspicious osseous abnormalities. Review of the MIP images confirms the above findings. IMPRESSION: 1. No aortic dissection or pulmonary embolus. Stable ascending thoracic aortic aneurysm at 4 cm without acute aortic abnormality. Moderate atherosclerosis. 2. Ectatic abdominal aorta at risk for aneurysm  development. Recommend followup by ultrasound in 5 years. This recommendation follows ACR consensus guidelines: White Paper of the ACR Incidental Findings Committee II on Vascular Findings. J Am Coll Radiol 2013; 10:789-794. Aortic aneurysm NOS (ICD10-I71.9) 3. Motion artifact through the proximal pancreas, equivocal wall peripancreatic edema versus motion. Recommend correlation with pancreatic enzymes. 4. Colonic diverticulosis without diverticulitis. 5. Generalized subcutaneous edema suggesting fluid overload. Aortic Atherosclerosis (ICD10-I70.0) and Emphysema (ICD10-J43.9). Electronically Signed   By: Keith Rake M.D.   On: 01/03/2020 00:13      Rudene Re, MD 01/03/20 0111

## 2020-01-03 NOTE — ED Notes (Signed)
Pt walked in hall and in room, pt's sats dropping to 92% on room air, but increased to 95-96% on room air when resting. Pt's heart rate increased to 110 at times while walking, 86-95 at rest.

## 2020-01-05 ENCOUNTER — Other Ambulatory Visit: Payer: Self-pay

## 2020-01-05 ENCOUNTER — Emergency Department: Payer: Medicare Other

## 2020-01-05 ENCOUNTER — Encounter: Payer: Self-pay | Admitting: Radiology

## 2020-01-05 ENCOUNTER — Observation Stay
Admission: EM | Admit: 2020-01-05 | Discharge: 2020-01-06 | Disposition: A | Discharge status: Home Health | Payer: Medicare Other | Attending: Internal Medicine | Admitting: Internal Medicine

## 2020-01-05 DIAGNOSIS — J209 Acute bronchitis, unspecified: Principal | ICD-10-CM | POA: Insufficient documentation

## 2020-01-05 DIAGNOSIS — Z992 Dependence on renal dialysis: Secondary | ICD-10-CM | POA: Diagnosis not present

## 2020-01-05 DIAGNOSIS — I2510 Atherosclerotic heart disease of native coronary artery without angina pectoris: Secondary | ICD-10-CM | POA: Insufficient documentation

## 2020-01-05 DIAGNOSIS — I5032 Chronic diastolic (congestive) heart failure: Secondary | ICD-10-CM | POA: Insufficient documentation

## 2020-01-05 DIAGNOSIS — J439 Emphysema, unspecified: Secondary | ICD-10-CM | POA: Insufficient documentation

## 2020-01-05 DIAGNOSIS — D638 Anemia in other chronic diseases classified elsewhere: Secondary | ICD-10-CM | POA: Diagnosis not present

## 2020-01-05 DIAGNOSIS — R0602 Shortness of breath: Secondary | ICD-10-CM

## 2020-01-05 DIAGNOSIS — Z79899 Other long term (current) drug therapy: Secondary | ICD-10-CM | POA: Diagnosis not present

## 2020-01-05 DIAGNOSIS — Z79891 Long term (current) use of opiate analgesic: Secondary | ICD-10-CM | POA: Insufficient documentation

## 2020-01-05 DIAGNOSIS — Z886 Allergy status to analgesic agent status: Secondary | ICD-10-CM | POA: Diagnosis not present

## 2020-01-05 DIAGNOSIS — R059 Cough, unspecified: Secondary | ICD-10-CM

## 2020-01-05 DIAGNOSIS — Z86711 Personal history of pulmonary embolism: Secondary | ICD-10-CM | POA: Insufficient documentation

## 2020-01-05 DIAGNOSIS — Z7901 Long term (current) use of anticoagulants: Secondary | ICD-10-CM | POA: Insufficient documentation

## 2020-01-05 DIAGNOSIS — I132 Hypertensive heart and chronic kidney disease with heart failure and with stage 5 chronic kidney disease, or end stage renal disease: Secondary | ICD-10-CM | POA: Diagnosis not present

## 2020-01-05 DIAGNOSIS — I25118 Atherosclerotic heart disease of native coronary artery with other forms of angina pectoris: Secondary | ICD-10-CM

## 2020-01-05 DIAGNOSIS — R079 Chest pain, unspecified: Secondary | ICD-10-CM

## 2020-01-05 DIAGNOSIS — N2581 Secondary hyperparathyroidism of renal origin: Secondary | ICD-10-CM | POA: Insufficient documentation

## 2020-01-05 DIAGNOSIS — Z20822 Contact with and (suspected) exposure to covid-19: Secondary | ICD-10-CM | POA: Diagnosis not present

## 2020-01-05 DIAGNOSIS — I480 Paroxysmal atrial fibrillation: Secondary | ICD-10-CM | POA: Diagnosis not present

## 2020-01-05 DIAGNOSIS — D631 Anemia in chronic kidney disease: Secondary | ICD-10-CM | POA: Insufficient documentation

## 2020-01-05 DIAGNOSIS — Z96649 Presence of unspecified artificial hip joint: Secondary | ICD-10-CM | POA: Diagnosis not present

## 2020-01-05 DIAGNOSIS — I509 Heart failure, unspecified: Secondary | ICD-10-CM

## 2020-01-05 DIAGNOSIS — N186 End stage renal disease: Secondary | ICD-10-CM | POA: Insufficient documentation

## 2020-01-05 DIAGNOSIS — F17210 Nicotine dependence, cigarettes, uncomplicated: Secondary | ICD-10-CM | POA: Diagnosis not present

## 2020-01-05 DIAGNOSIS — Z888 Allergy status to other drugs, medicaments and biological substances status: Secondary | ICD-10-CM | POA: Insufficient documentation

## 2020-01-05 LAB — BASIC METABOLIC PANEL
Anion gap: 11 (ref 5–15)
BUN: 40 mg/dL — ABNORMAL HIGH (ref 8–23)
CO2: 28 mmol/L (ref 22–32)
Calcium: 8.4 mg/dL — ABNORMAL LOW (ref 8.9–10.3)
Chloride: 99 mmol/L (ref 98–111)
Creatinine, Ser: 6.33 mg/dL — ABNORMAL HIGH (ref 0.61–1.24)
GFR calc Af Amer: 10 mL/min — ABNORMAL LOW (ref >60)
GFR calc non Af Amer: 9 mL/min — ABNORMAL LOW (ref >60)
Glucose, Bld: 118 mg/dL — ABNORMAL HIGH (ref 70–99)
Potassium: 4.2 mmol/L (ref 3.5–5.1)
Sodium: 138 mmol/L (ref 135–145)

## 2020-01-05 LAB — CBC
HCT: 29 % — ABNORMAL LOW (ref 39.0–52.0)
Hemoglobin: 9.9 g/dL — ABNORMAL LOW (ref 13.0–17.0)
MCH: 29.8 pg (ref 26.0–34.0)
MCHC: 34.1 g/dL (ref 30.0–36.0)
MCV: 87.3 fL (ref 80.0–100.0)
Platelets: 90 10*3/uL — ABNORMAL LOW (ref 150–400)
RBC: 3.32 MIL/uL — ABNORMAL LOW (ref 4.22–5.81)
RDW: 15.8 % — ABNORMAL HIGH (ref 11.5–15.5)
WBC: 5.7 10*3/uL (ref 4.0–10.5)
nRBC: 0.4 % — ABNORMAL HIGH (ref 0.0–0.2)

## 2020-01-05 LAB — HIV ANTIBODY (ROUTINE TESTING W REFLEX): HIV Screen 4th Generation wRfx: NONREACTIVE

## 2020-01-05 LAB — SARS CORONAVIRUS 2 BY RT PCR (HOSPITAL ORDER, PERFORMED IN ~~LOC~~ HOSPITAL LAB): SARS Coronavirus 2: NEGATIVE

## 2020-01-05 LAB — TROPONIN I (HIGH SENSITIVITY)
Troponin I (High Sensitivity): 68 ng/L — ABNORMAL HIGH (ref <18)
Troponin I (High Sensitivity): 75 ng/L — ABNORMAL HIGH (ref <18)

## 2020-01-05 LAB — BRAIN NATRIURETIC PEPTIDE: B Natriuretic Peptide: >4500 pg/mL — ABNORMAL HIGH (ref 0.0–100.0)

## 2020-01-05 MED ORDER — ISOSORBIDE MONONITRATE ER 30 MG PO TB24
60.0000 mg | ORAL_TABLET | Freq: Every day | ORAL | Status: DC
  Administered 2020-01-05: 60 mg via ORAL
  Filled 2020-01-05: qty 2

## 2020-01-05 MED ORDER — OXYCODONE HCL ER 10 MG PO T12A
20.0000 mg | EXTENDED_RELEASE_TABLET | Freq: Two times a day (BID) | ORAL | Status: DC
  Administered 2020-01-05 (×2): 20 mg via ORAL
  Filled 2020-01-05 (×2): qty 2

## 2020-01-05 MED ORDER — HYDROCOD POLST-CPM POLST ER 10-8 MG/5ML PO SUER
5.0000 mL | Freq: Once | ORAL | Status: AC
  Administered 2020-01-05: 5 mL via ORAL
  Filled 2020-01-05: qty 5

## 2020-01-05 MED ORDER — SODIUM CHLORIDE 0.9% FLUSH
3.0000 mL | Freq: Two times a day (BID) | INTRAVENOUS | Status: DC
  Administered 2020-01-05 (×2): 3 mL via INTRAVENOUS

## 2020-01-05 MED ORDER — HYDRALAZINE HCL 50 MG PO TABS
100.0000 mg | ORAL_TABLET | Freq: Three times a day (TID) | ORAL | Status: DC
  Administered 2020-01-05 – 2020-01-06 (×4): 100 mg via ORAL
  Filled 2020-01-05 (×4): qty 2

## 2020-01-05 MED ORDER — SODIUM CHLORIDE 0.9 % IV SOLN: 250.0000 mL | INTRAVENOUS | Status: DC | PRN

## 2020-01-05 MED ORDER — AMLODIPINE BESYLATE 10 MG PO TABS
10.0000 mg | ORAL_TABLET | Freq: Every day | ORAL | Status: DC
  Administered 2020-01-05: 10 mg via ORAL
  Filled 2020-01-05: qty 1

## 2020-01-05 MED ORDER — IPRATROPIUM-ALBUTEROL 0.5-2.5 (3) MG/3ML IN SOLN
RESPIRATORY_TRACT | Status: AC
  Filled 2020-01-05: qty 3

## 2020-01-05 MED ORDER — OXYCODONE HCL 5 MG PO TABS
5.0000 mg | ORAL_TABLET | Freq: Four times a day (QID) | ORAL | Status: DC | PRN
  Administered 2020-01-05 – 2020-01-06 (×5): 10 mg via ORAL
  Filled 2020-01-05 (×6): qty 2

## 2020-01-05 MED ORDER — IPRATROPIUM-ALBUTEROL 0.5-2.5 (3) MG/3ML IN SOLN
3.0000 mL | Freq: Once | RESPIRATORY_TRACT | Status: AC
  Administered 2020-01-05: 3 mL via RESPIRATORY_TRACT
  Filled 2020-01-05: qty 3

## 2020-01-05 MED ORDER — SODIUM CHLORIDE 0.9 % IV SOLN
1.0000 g | INTRAVENOUS | Status: DC
  Administered 2020-01-05: 1 g via INTRAVENOUS
  Filled 2020-01-05: qty 10
  Filled 2020-01-05: qty 1
  Filled 2020-01-05: qty 10

## 2020-01-05 MED ORDER — SODIUM CHLORIDE 0.9% FLUSH: 3.0000 mL | INTRAVENOUS | Status: DC | PRN

## 2020-01-05 MED ORDER — METHYLPREDNISOLONE SODIUM SUCC 125 MG IJ SOLR
80.0000 mg | Freq: Once | INTRAMUSCULAR | Status: AC
  Administered 2020-01-05: 80 mg via INTRAVENOUS
  Filled 2020-01-05: qty 2

## 2020-01-05 MED ORDER — IPRATROPIUM-ALBUTEROL 0.5-2.5 (3) MG/3ML IN SOLN
3.0000 mL | Freq: Four times a day (QID) | RESPIRATORY_TRACT | Status: DC | PRN
  Administered 2020-01-06 (×2): 3 mL via RESPIRATORY_TRACT
  Filled 2020-01-05 (×2): qty 3

## 2020-01-05 MED ORDER — APIXABAN 2.5 MG PO TABS
2.5000 mg | ORAL_TABLET | Freq: Two times a day (BID) | ORAL | Status: DC
  Administered 2020-01-05 (×2): 2.5 mg via ORAL
  Filled 2020-01-05 (×2): qty 1

## 2020-01-05 MED ORDER — CHLORHEXIDINE GLUCONATE CLOTH 2 % EX PADS: 6.0000 | MEDICATED_PAD | Freq: Every day | CUTANEOUS | Status: DC

## 2020-01-05 MED ORDER — FERRIC CITRATE 1 GM 210 MG(FE) PO TABS
210.0000 mg | ORAL_TABLET | Freq: Three times a day (TID) | ORAL | Status: DC
  Administered 2020-01-05 – 2020-01-06 (×4): 210 mg via ORAL
  Filled 2020-01-05 (×7): qty 1

## 2020-01-05 MED ORDER — PREDNISONE 20 MG PO TABS
40.0000 mg | ORAL_TABLET | Freq: Every day | ORAL | Status: DC
  Administered 2020-01-05: 40 mg via ORAL
  Filled 2020-01-05: qty 2

## 2020-01-05 MED ORDER — PANTOPRAZOLE SODIUM 40 MG PO TBEC
40.0000 mg | DELAYED_RELEASE_TABLET | Freq: Every day | ORAL | Status: DC
  Administered 2020-01-05: 40 mg via ORAL
  Filled 2020-01-05: qty 1

## 2020-01-05 MED ORDER — MORPHINE SULFATE (PF) 4 MG/ML IV SOLN
4.0000 mg | Freq: Once | INTRAVENOUS | Status: AC
  Administered 2020-01-05: 4 mg via INTRAVENOUS
  Filled 2020-01-05: qty 1

## 2020-01-05 MED ORDER — EPOETIN ALFA 4000 UNIT/ML IJ SOLN
3000.0000 [IU] | INTRAMUSCULAR | Status: DC
  Filled 2020-01-05: qty 1

## 2020-01-05 MED ORDER — ATORVASTATIN CALCIUM 20 MG PO TABS
40.0000 mg | ORAL_TABLET | Freq: Every day | ORAL | Status: DC
  Administered 2020-01-05: 40 mg via ORAL
  Filled 2020-01-05: qty 2

## 2020-01-05 MED ORDER — HYDROCOD POLST-CPM POLST ER 10-8 MG/5ML PO SUER
5.0000 mL | Freq: Two times a day (BID) | ORAL | Status: DC | PRN
  Administered 2020-01-05: 5 mL via ORAL
  Filled 2020-01-05: qty 5

## 2020-01-05 MED ORDER — IRBESARTAN 150 MG PO TABS
75.0000 mg | ORAL_TABLET | Freq: Every day | ORAL | Status: DC
  Administered 2020-01-05: 75 mg via ORAL
  Filled 2020-01-05: qty 1

## 2020-01-05 NOTE — ED Notes (Signed)
Pt c/o constant CP and SHOB at this time. Pt exhibiting inspiratory and expiratory wheezes post duoneb tx. EDP notified. See [facility]

## 2020-01-05 NOTE — ED Triage Notes (Signed)
Pt states central chest pain and shob today. Pt states he has had nausea and vomiting. Pt with tachypnea noted that slows when speaking. Pt is able to speak in full sentences, gets dialysis on mwf.

## 2020-01-05 NOTE — TOC Initial Note (Signed)
Transition of Care [facility]) - Initial/Assessment Note    Patient Details  Name: [name] MRN: [id number] Date of Birth: [birth year]  Transition of [facility]) CM/SW Contact:    Boris Sharper, LCSW Phone Number: 01/05/2020, 3:51 PM  Clinical Narrative:                 CSW notified family of PT recommendations they are agreeable to [facility] CSW contacted [facility] with advanced, and they were able to accept.  TOC will continue to follow  Expected Discharge Plan: [place] Barriers to Discharge: Continued Medical Work up   Patient Goals and CMS Choice Patient states their goals for this hospitalization and ongoing recovery are:: to go home      Expected Discharge Plan and Services Expected Discharge Plan: [place] Acute Care Choice: [place] arrangements for the past 2 months: [place]: PT [place]: [place] ([place]) Date [place]: 01/05/20 Time [place]: [place] Representative spoke with at [place]: Corene Cornea  Prior Living Arrangements/Services Living arrangements for the past 2 months: [place] with:: Self, Parents Patient language and need for interpreter reviewed:: Yes Do you feel safe going back to the place where you live?: Yes      Need for Family Participation in Patient Care: Yes (Comment) Care giver support system in place?: Yes (comment) (Parents)   Criminal Activity/Legal Involvement Pertinent to Current Situation/Hospitalization: No - Comment as needed  Activities of Daily Living Home Assistive Devices/Equipment: Cane (specify quad or straight) ADL Screening (condition at time of admission) Patient's cognitive ability adequate to safely complete daily activities?: Yes Is the patient deaf or have difficulty hearing?: No Does the patient have difficulty seeing, even when wearing glasses/contacts?:  No Does the patient have difficulty concentrating, remembering, or making decisions?: No Patient able to express need for assistance with ADLs?: Yes Does the patient have difficulty dressing or bathing?: No Independently performs ADLs?: Yes (appropriate for developmental age) Does the patient have difficulty walking or climbing stairs?: Yes Weakness of Legs: None Weakness of Arms/Hands: None  Permission Sought/Granted Permission sought to share information with : Facility Art therapist granted to share information with : Yes, Verbal Permission Granted  Share Information with NAME: Evelena Peat     Permission granted to share info w Relationship: Father  Permission granted to share info w Contact Information: [phone]  Emotional Assessment Appearance:: Other (Comment Required (Unable to assess) Attitude/Demeanor/Rapport: Unable to Assess Affect (typically observed): Unable to Assess Orientation: : Oriented to Self, Oriented to Place, Oriented to  Time, Oriented to Situation   Psych Involvement: No (comment)  Admission diagnosis:  Acute bronchitis [J20.9] Cough [R05] SOB (shortness of breath) [R06.02] ESRD (end stage renal disease) on dialysis ([place]) [N18.6, Z99.2] Nonspecific chest pain [R07.9] Acute on chronic congestive heart failure, unspecified heart failure type [facility]) [I50.9] Patient Active Problem List   Diagnosis Date Noted   CAD (coronary artery disease) 01/05/2020   Chronic anticoagulation 01/05/2020   Acute bronchitis 01/05/2020   AF (paroxysmal atrial fibrillation) ([place]) 01/05/2020   History of substance abuse ([place]) 10/24/2019   Elevated troponin 10/24/2019   Chest pain 10/24/2019   Chronic diastolic CHF (congestive heart failure) ([place]) 09/28/2019   History of MI (myocardial  infarction) 09/28/2019   Anemia of chronic kidney failure, stage 5 ([place]) 09/28/2019   History of pulmonary embolism 09/28/2019   Chronic midline low back pain     Acute respiratory failure with hypoxia (HCC)    Acute diastolic CHF (congestive heart failure) (HCC)    Anemia of chronic disease    Thrombocytopenia (HCC)    Congestive heart failure (HCC)    Fluid overload 06/21/2019   Volume overload 11/15/2018   ESRD (end stage renal disease) on dialysis ([place]) 11/14/2018   HCAP (healthcare-associated pneumonia) 08/27/2018   Dyspnea 08/25/2018   Chest pain of uncertain etiology 94/85/4627   ESRD on dialysis ([place]) 11/08/2016   Prostate cancer screening 11/08/2016   Acquired cyst of kidney 11/08/2016   Urinary urgency 11/08/2016   Acute on chronic renal failure ([place]) 09/23/2016   Hypertensive urgency 09/21/2016   Dysthymia 02/26/2016   Chronic pain 02/26/2016   Noncompliance with renal dialysis ([place]) 02/26/2016   Opiate abuse, continuous ([place]) 02/03/2016   Substance induced mood disorder ([place]) 02/03/2016   Antisocial personality disorder ([place]) 02/03/2016   Left-sided weakness 01/18/2016   HEPATITIS C 06/28/2008   HLD (hyperlipidemia) 06/28/2008   SUBSTANCE ABUSE, MULTIPLE 06/28/2008   Hypertension 06/27/2008   PCP:  Patient, No Pcp Per Pharmacy:   [id number] [place], [place] [address] [place] [address] Phone: [phone] Fax: [phone]  [id number] - [place], [place] [address] [place] [address] Phone: [phone] Fax: [phone]  [facility] [address] [place], [place] [address] [place] [address] Phone: [phone] Fax: [phone]     Social Determinants of Health (SDOH) Interventions    Readmission Risk Interventions Readmission Risk Prevention Plan 11/16/2018 11/14/2018  Transportation Screening Complete Complete  Medication Review Press photographer) Complete Complete  PCP or Specialist appointment within 3-5 days of discharge Complete -  [place] or Home Care Consult  Patient refused -  Palliative Care Screening Not Applicable -  [place] Not Applicable Not Applicable  Some recent data might be hidden

## 2020-01-05 NOTE — ED Notes (Signed)
Pt transported to room 153.

## 2020-01-05 NOTE — Evaluation (Signed)
Physical Therapy Evaluation Patient Details Name: [name] MRN: [id number] DOB: [birth year] Today's Date: 01/05/2020   History of Present Illness  Patient is a 64 y/o M that presents with cough, shortness of breath with chest pain, fever, and chills. He has a history of hip replacement several years ago.  Clinical Impression  Patient is a 64 y/o M that lives at home with his parents, he has increasingly become more dependent for their assistance per patient. He uses a SPC for mobility at home, and appears near his recent mobility baseline this session. He requires no assistance from therapist for bed mobility or transfers and is able to ambulate a household distance with [facility] safely this date. He did report fatigue in doing so and would likely be a good candidate for HHPT to address endurance and balance deficits.     Follow Up Recommendations Home health PT    Equipment Recommendations  None recommended by PT    Recommendations for Other Services       Precautions / Restrictions Precautions Precautions: Fall Restrictions Weight Bearing Restrictions: No      Mobility  Bed Mobility Overal bed mobility: Independent             General bed mobility comments: Patient able to complete transfer with no deficit observed  Transfers Overall transfer level: Needs assistance Equipment used: Straight cane Transfers: Sit to/from Stand Sit to Stand: Supervision         General transfer comment: No significant deficit observed in transfer, able to complete with use of UEs minimally.  Ambulation/Gait Ambulation/Gait assistance: Min guard Gait Distance (Feet): 75 Feet Assistive device: Straight cane Gait Pattern/deviations: WFL(Within Functional Limits)   Gait velocity interpretation: 1.31 - 2.62 ft/sec, indicative of limited community ambulator General Gait Details: Mild trendelenburg gait pattern observed, no significant loss of balance observed.  Stairs             Wheelchair Mobility    Modified Rankin (Stroke Patients Only)       Balance Overall balance assessment: Needs assistance Sitting-balance support: No upper extremity supported;Feet supported Sitting balance-Leahy Scale: Good     Standing balance support: Single extremity supported Standing balance-Leahy Scale: Good                               Pertinent Vitals/Pain Pain Assessment:  (Reports some achiness in his LEs though does not rate.)    Home Living Family/patient expects to be discharged to:: Private residence Living Arrangements: Parent Available Help at Discharge: Family;Available PRN/intermittently Type of Home: House Home Access: Stairs to enter Entrance Stairs-Rails:  (Unclear) Entrance Stairs-Number of Steps: 2-3 Home Layout: One level Home Equipment: Cane - single point      Prior Function Level of Independence: Independent with assistive device(s)         Comments: Patient typically ambulates short distances with SPC, denies any recent falls.     Hand Dominance        Extremity/Trunk Assessment   Upper Extremity Assessment Upper Extremity Assessment: Overall WFL for tasks assessed    Lower Extremity Assessment Lower Extremity Assessment: Overall WFL for tasks assessed (Able to transfer sit to stand with minimal use of his UEs.)       Communication   Communication: No difficulties  Cognition Arousal/Alertness: Awake/alert Behavior During Therapy: WFL for tasks assessed/performed Overall Cognitive Status: Within Functional Limits for tasks assessed  General Comments      Exercises     Assessment/Plan    PT Assessment Patient needs continued PT services  PT Problem List Decreased strength;Decreased mobility;Decreased activity tolerance;Cardiopulmonary status limiting activity;Decreased balance       PT Treatment Interventions DME instruction;Therapeutic  exercise;Gait training;Balance training;Stair training;Neuromuscular re-education;Functional mobility training;Therapeutic activities;Patient/family education    PT Goals (Current goals can be found in the Care Plan section)  Acute Rehab PT Goals Patient Stated Goal: To return home safely PT Goal Formulation: With patient Time For Goal Achievement: 01/19/20 Potential to Achieve Goals: Good    Frequency Min 2X/week   Barriers to discharge        Co-evaluation               AM-PAC PT "6 Clicks" Mobility  Outcome Measure Help needed turning from your back to your side while in a flat bed without using bedrails?: None Help needed moving from lying on your back to sitting on the side of a flat bed without using bedrails?: None Help needed moving to and from a bed to a chair (including a wheelchair)?: None Help needed standing up from a chair using your arms (e.g., wheelchair or bedside chair)?: None Help needed to walk in hospital room?: None Help needed climbing 3-5 steps with a railing? : A Little 6 Click Score: 23    End of Session Equipment Utilized During Treatment: Gait belt Activity Tolerance: Patient tolerated treatment well;Patient limited by fatigue Patient left: in chair;with chair alarm set Nurse Communication: Mobility status PT Visit Diagnosis: Difficulty in walking, not elsewhere classified (R26.2)    Time: 1202-1212 PT Time Calculation (min) (ACUTE ONLY): 10 min   Charges:   PT Evaluation $PT Eval Moderate Complexity: 1 Mod        Royce Macadamia PT, DPT, CSCS    01/05/2020, 1:33 PM

## 2020-01-05 NOTE — Consult Note (Signed)
[name] MRN: [id number] DOB/AGE: 62-Sep-1957 62 y.o. Primary Care Physician:Patient, No Pcp Per Admit date: 01/05/2020 Chief Complaint:  Chief Complaint  Patient presents with  . Chest Pain   HPI: Patient is a 64 year old African-American male with a past medical history of end-stage renal disease, patient is on hemodialysis, patient is a Monday Wednesday Friday schedule, chronic diastolic dysfunction, hypertension, nonobstructive coronary disease, history of pulmonary embolism-patient is on Eliquis who came to the ER with chief complaint of cough .  History of present illness dates back to 4 to 5 days ago when patient started having cough, it was nonproductive .Patient came to the ER on June 24 with similar complaints and was treated with nebulizer treatment and discharged home on doxycycline, albuterol and prednisone. Patient also complains of chest pain, associated with his cough it was central in location Patient upon valuation in the ER was found to have bronchitis and was admitted for further treatment Patient was seen today on first floor Patient offers no complaint of diarrhea No complaint of chills No complaint of lower abdominal pain No complaint of hematuria No complaint of  change in speech or change in vision Patient did get his last dialysis on Friday    Past Medical History:  Diagnosis Date  . Depression   . Diastolic dysfunction    a. 09/2019 Echo: EF 55-60%, no rwma, mod LVH, gr2 DD. Nl RV size/fxn. Mildly dil LA. Ao root 4.5cm.  . Dilated aortic root ([place])    a. 09/2019 Echo: Ao root 4.5cm.  . Elevated troponin level not due myocardial infarction   . ESRD (end stage renal disease) ([place])   . Hypertension   . Nonobstructive Coronary Artery Disease    a. 09/2019 Cath: LM nl, LAD min irregs, D1 nl, D2 min irregs, LCX large, min irregs, RCA large, 20p, 30p/m.  . PE (pulmonary embolism)   . Renal insufficiency         History reviewed. No pertinent family  history.  Social History:  reports that he has been smoking cigarettes. He has been smoking about 0.50 packs per day. He has never used smokeless tobacco. He reports that he does not drink alcohol and does not use drugs.   Allergies:  Allergies  Allergen Reactions  . Cyclobenzaprine Nausea Only  . Clonidine Other (See Comments)    Asymptomatic bradycardia to 38  . Furosemide Other (See Comments)    Caused renal failure  . Tylenol [Acetaminophen] Other (See Comments)    Reaction:  Pt states it bothers his liver    Medications Prior to Admission  Medication Sig Dispense Refill  . albuterol (VENTOLIN HFA) 108 (90 Base) MCG/ACT inhaler Inhale 2 puffs into the lungs every 6 (six) hours as needed for up to 7 days for wheezing or shortness of breath. 8 g 0  . amLODipine (NORVASC) 10 MG tablet Take 1 tablet (10 mg total) by mouth daily. 30 tablet 1  . apixaban (ELIQUIS) 2.5 MG TABS tablet Take 1 tablet (2.5 mg total) by mouth 2 (two) times daily. 60 tablet 1  . atorvastatin (LIPITOR) 40 MG tablet Take 1 tablet (40 mg total) by mouth at bedtime. 30 tablet 0  . AURYXIA 1 GM 210 MG(Fe) tablet Take 210 mg by mouth See admin instructions. Take 1 tablet (210mg ) by mouth three times daily with meals and take 1 tablet (210mg ) by mouth with snacks    . doxycycline (ADOXA) 100 MG tablet Take 1 tablet (100 mg total) by mouth  2 (two) times daily for 7 days. 14 tablet 0  . hydrALAZINE (APRESOLINE) 100 MG tablet Take 1 tablet (100 mg total) by mouth 3 (three) times daily. 90 tablet 1  . irbesartan (AVAPRO) 75 MG tablet Take 1 tablet (75 mg total) by mouth at bedtime. 30 tablet 1  . isosorbide mononitrate (IMDUR) 60 MG 24 hr tablet Take 1 tablet (60 mg total) by mouth daily. 30 tablet 1  . ondansetron (ZOFRAN) 4 MG tablet Take 1 tablet (4 mg total) by mouth every 6 (six) hours as needed for nausea. 20 tablet 0  . oxyCODONE (OXY IR/ROXICODONE) 5 MG immediate release tablet Take 5-10 mg by mouth every 6 (six)  hours as needed for severe pain.    Marland Kitchen oxyCODONE (OXYCONTIN) 20 mg 12 hr tablet Take 20 mg by mouth every 12 (twelve) hours.    . pantoprazole (PROTONIX) 40 MG tablet Take 1 tablet (40 mg total) by mouth daily. 30 tablet 0  . predniSONE (DELTASONE) 20 MG tablet Take 2 tablets (40 mg total) by mouth daily for 5 days. 10 tablet 0       VQQ:VZDGL from the symptoms mentioned above,there are no other symptoms referable to all systems reviewed.  Marland Kitchen amLODipine  10 mg Oral Daily  . apixaban  2.5 mg Oral BID  . atorvastatin  40 mg Oral QHS  . ferric citrate  210 mg Oral TID with meals  . hydrALAZINE  100 mg Oral TID  . irbesartan  75 mg Oral QHS  . isosorbide mononitrate  60 mg Oral Daily  . oxyCODONE  20 mg Oral Q12H  . pantoprazole  40 mg Oral Daily  . predniSONE  40 mg Oral Daily  . sodium chloride flush  3 mL Intravenous Q12H        Physical Exam: Vital signs in last 24 hours: Temp:  [97.5 F (36.4 C)-98 F (36.7 C)] 97.5 F (36.4 C) (06/27 1557) Pulse Rate:  [52-97] 52 (06/27 1557) Resp:  [16-24] 18 (06/27 1557) BP: (136-164)/(79-117) 141/100 (06/27 1557) SpO2:  [94 %-100 %] 97 % (06/27 1557) Weight:  [108.9 kg] 108.9 kg (06/27 0124) Weight change:     Intake/Output from previous day: No intake/output data recorded. Total I/O In: 540.1 [P.O.:440; IV Piggyback:100.1] Out: -    Physical Exam: General- pt is awake,alert, oriented to time place and person Resp- No acute REsp distress, Rhonchi minimal CVS- S1S2 regular ij rate and rhythm GIT- BS+, soft, NT, ND EXT- NO LE Edema, Cyanosis CNS- CN 2-12 grossly intact. Moving all 4 extremities Psych- normal mod and affect Access-AVF   Lab Results: CBC Recent Labs    01/05/20 0143  WBC 5.7  HGB 9.9*  HCT 29.0*  PLT 90*    BMET Recent Labs    01/05/20 0143  NA 138  K 4.2  CL 99  CO2 28  GLUCOSE 118*  BUN 40*  CREATININE 6.33*  CALCIUM 8.4*    MICRO Recent Results (from the past 240 hour(s))  SARS  Coronavirus 2 by RT PCR (hospital order, performed in [facility] hospital lab) Nasopharyngeal Nasopharyngeal Swab     Status: None   Collection Time: 01/02/20 10:52 PM   Specimen: Nasopharyngeal Swab  Result Value Ref Range Status   SARS Coronavirus 2 NEGATIVE NEGATIVE Final    Comment: (NOTE) SARS-CoV-2 target nucleic acids are NOT DETECTED.  The SARS-CoV-2 RNA is generally detectable in upper and lower respiratory specimens during the acute phase of infection. The lowest concentration  of SARS-CoV-2 viral copies this assay can detect is 250 copies / mL. A negative result does not preclude SARS-CoV-2 infection and should not be used as the sole basis for treatment or other patient management decisions.  A negative result may occur with improper specimen collection / handling, submission of specimen other than nasopharyngeal swab, presence of viral mutation(s) within the areas targeted by this assay, and inadequate number of viral copies (<250 copies / mL). A negative result must be combined with clinical observations, patient history, and epidemiological information.  Fact Sheet for Patients:   StrictlyIdeas.no  Fact Sheet for Healthcare Providers: BankingDealers.co.za  This test is not yet approved or  cleared by the [place] FDA and has been authorized for detection and/or diagnosis of SARS-CoV-2 by FDA under an Emergency Use Authorization (EUA).  This EUA will remain in effect (meaning this test can be used) for the duration of the COVID-19 declaration under Section 564(b)(1) of the Act, 21 U.S.C. section 360bbb-3(b)(1), unless the authorization is terminated or revoked sooner.  Performed at [facility], [address] [place], [place] [address]   SARS Coronavirus 2 by RT PCR (hospital order, performed in [facility] hospital lab) Nasopharyngeal Nasopharyngeal Swab     Status: None   Collection Time: 01/05/20   6:50 AM   Specimen: Nasopharyngeal Swab  Result Value Ref Range Status   SARS Coronavirus 2 NEGATIVE NEGATIVE Final    Comment: (NOTE) SARS-CoV-2 target nucleic acids are NOT DETECTED.  The SARS-CoV-2 RNA is generally detectable in upper and lower respiratory specimens during the acute phase of infection. The lowest concentration of SARS-CoV-2 viral copies this assay can detect is 250 copies / mL. A negative result does not preclude SARS-CoV-2 infection and should not be used as the sole basis for treatment or other patient management decisions.  A negative result may occur with improper specimen collection / handling, submission of specimen other than nasopharyngeal swab, presence of viral mutation(s) within the areas targeted by this assay, and inadequate number of viral copies (<250 copies / mL). A negative result must be combined with clinical observations, patient history, and epidemiological information.  Fact Sheet for Patients:   StrictlyIdeas.no  Fact Sheet for Healthcare Providers: BankingDealers.co.za  This test is not yet approved or  cleared by the [place] FDA and has been authorized for detection and/or diagnosis of SARS-CoV-2 by FDA under an Emergency Use Authorization (EUA).  This EUA will remain in effect (meaning this test can be used) for the duration of the COVID-19 declaration under Section 564(b)(1) of the Act, 21 U.S.C. section 360bbb-3(b)(1), unless the authorization is terminated or revoked sooner.  Performed at [facility], [address], [place], [place] [address]       Lab Results  Component Value Date   PTH 758 (H) 08/15/2018   CALCIUM 8.4 (L) 01/05/2020   PHOS 7.8 (H) 09/30/2019      Impression:   Patient is a 64 year old freak American male with past medical history of end-stage renal disease, on hemodialysis, on Monday Wednesday Friday schedule, chronic diastolic CHF,  history of pulmonary bleeding, coronary artery disease, history of PE on Eliquis, hypertension who came to the hospital with chief complaint of chest pain and shortness of breath.  Patient is admitted with acute bronchitis.   1)Renal  End-stage renal disease Patient is a hemodialysis Patient is on Monday Wednesday Friday schedule We will dialyze patient in the morning No need for emergent dialysis today  2)HTN Blood  pressure is near to goal   3)Anemia of chronic disease  HGb at goal (9--11)   4) secondary hyperparathyroidism CKD Mineral-Bone Disorder  Secondary Hyperparathyroidism present.  Phosphorus is not at goal. We will check patient phosphorus  5) acute bronchitis  Patient is admitted with bronchitis . Patient is on Rocephin/bronchodilators  6) electrolytes Normokalemic NOrmonatremic   7)Acid base Co2 at goal   8) diastolic CHF Patient is stable. Patient chest x-ray does show vascular congestion We will dialyze patient in morning that will help  9) PE Patient history of PE patient is on Eliquis  Plan:  We will dialyze patient on Monday We will try to take 2.5 L off to help with CHF We will keep patient on Epogen Agree with the current treatment and plan      Dariel Pellecchia s [facility] 01/05/2020, 4:59 PM

## 2020-01-05 NOTE — Evaluation (Signed)
Occupational Therapy Evaluation Patient Details Name: [name] MRN: [id number] DOB: 01-07-1956 Today's Date: 01/05/2020    History of Present Illness Patient is a 64 y/o M that presents with cough, shortness of breath with chest pain, fever, and chills. He has a history of hip replacement several years ago.   Clinical Impression   [name] was seen for OT evaluation this date. Prior to hospital admission, pt was MOD I c SPC for mobility and ADLs. Pt lives c his parents in [facility]. Pt presents to acute OT demonstrating impaired ADL performance and functional mobility 2/2 decreased activity tolerance, functional balance deficits, and decreased LB access. Pt currently requires SPC + SBA for ADL t/f. Increased time to pull up socks seated EOC - anticipate MIN A for LBD. Pt tolerated ~87min standing c single UE support before requiring BUE support for additional ~1 min prior to requesting to sit. Pt would benefit from skilled OT to address noted impairments and functional limitations in order to maximize safety and independence while minimizing falls risk and caregiver burden. Upon hospital discharge, recommend HHOT to maximize pt safety and return to functional independence during meaningful occupations of daily life.     Follow Up Recommendations  Home health OT    Equipment Recommendations  None recommended by OT    Recommendations for Other Services       Precautions / Restrictions Precautions Precautions: Fall Restrictions Weight Bearing Restrictions: No      Mobility Bed Mobility Overal bed mobility: Independent             General bed mobility comments: Pt received and left up in chair  Transfers Overall transfer level: Needs assistance Equipment used: Straight cane Transfers: Sit to/from Stand Sit to Stand: Supervision         General transfer comment: No significant deficit observed in transfer, able to complete with use of UEs minimally.    Balance  Overall balance assessment: Needs assistance Sitting-balance support: No upper extremity supported;Feet supported Sitting balance-Leahy Scale: Good     Standing balance support: Single extremity supported Standing balance-Leahy Scale: Good                             ADL either performed or assessed with clinical judgement   ADL Overall ADL's : Needs assistance/impaired                                       General ADL Comments: SPC + SBA for ADL t/f. Increased time for LB access seated EOC. SBA + SPC simulated UBD in standing.      Vision         Perception     Praxis      Pertinent Vitals/Pain Pain Assessment: 0-10 Pain Score: 8  Pain Location: back/chest Pain Descriptors / Indicators: Constant;Discomfort Pain Intervention(s): Limited activity within patient's tolerance;Patient requesting pain meds-RN notified     Hand Dominance Right   Extremity/Trunk Assessment Upper Extremity Assessment Upper Extremity Assessment: Overall WFL for tasks assessed   Lower Extremity Assessment Lower Extremity Assessment: Overall WFL for tasks assessed       Communication Communication Communication: No difficulties   Cognition Arousal/Alertness: Awake/alert Behavior During Therapy: WFL for tasks assessed/performed Overall Cognitive Status: Within Functional Limits for tasks assessed  General Comments       Exercises Exercises: Other exercises Other Exercises Other Exercises: Pt educated re: OT role, DME recs, d/c recs, safe t/f technique, IS (frequency and use), home/routines modifications Other Exercises: LBD, UBD, self-drinking including setup, sitting/standing balance/tolerance, IS   Shoulder Instructions      Home Living Family/patient expects to be discharged to:: Private residence Living Arrangements: Parent Available Help at Discharge: Family;Available PRN/intermittently Type of  Home: House Home Access: Stairs to enter [employer] of Steps: 2-3 Entrance Stairs-Rails:  (Unclear) Home Layout: One level     Bathroom Shower/Tub: Tub/shower unit         Home Equipment: Cane - single point          Prior Functioning/Environment Level of Independence: Independent with assistive device(s)        Comments: Patient typically ambulates short distances with SPC, denies any recent falls.        OT Problem List: Decreased strength;Decreased activity tolerance      OT Treatment/Interventions: Self-care/ADL training;Therapeutic exercise;Energy conservation;DME and/or AE instruction;Patient/family education;Balance training    OT Goals(Current goals can be found in the care plan section) Acute Rehab OT Goals Patient Stated Goal: To return home safely OT Goal Formulation: With patient Time For Goal Achievement: 01/19/20 Potential to Achieve Goals: Good ADL Goals Pt Will Transfer to Toilet: with modified independence;ambulating;regular height toilet (c LRAD PRN) Additional ADL Goal #1: Pt will Independently verbalize plan to implement x3 ECS Additional ADL Goal #2: Pt will Independently verbalize plan to implement x3 falls prevention strategies  OT Frequency: Min 1X/week   Barriers to D/C: Inaccessible home environment          Co-evaluation              AM-PAC OT "6 Clicks" Daily Activity     Outcome Measure Help from another person eating meals?: None Help from another person taking care of personal grooming?: None Help from another person toileting, which includes using toliet, bedpan, or urinal?: A Little Help from another person bathing (including washing, rinsing, drying)?: A Little Help from another person to put on and taking off regular upper body clothing?: None Help from another person to put on and taking off regular lower body clothing?: A Little 6 Click Score: 21   End of Session Nurse Communication: Patient requests  pain meds  Activity Tolerance: Patient tolerated treatment well Patient left: in chair;with call bell/phone within reach  OT Visit Diagnosis: Other abnormalities of gait and mobility (R26.89)                Time: 0160-1093 OT Time Calculation (min): 10 min Charges:  OT General Charges $OT Visit: 1 Visit OT Evaluation $OT Eval Low Complexity: 1 Low  Dessie Coma, M.S. OTR/L  01/05/20, 4:35 PM

## 2020-01-05 NOTE — ED Provider Notes (Signed)
[facility] Emergency Department Provider Note   ____________________________________________   First MD Initiated Contact with Patient 01/05/20 [phone]     (approximate)  I have reviewed the triage vital signs and the nursing notes.   HISTORY  Chief Complaint Chest Pain    HPI [name] is a 64 y.o. male who returns to the ED from home with continued chest tightness, cough and shortness of breath.  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  CT scan negative for acute changes, patient was treated with nebulizer treatments and discharged home on doxycycline, albuterol and prednisone.  Reports he did get dialyzed on Friday.  States he continues with cough, chest tightness and shortness of breath.  Denies fever, chills, abdominal pain, nausea, vomiting or dizziness.  Denies COVID-19 exposure.       Past Medical History:  Diagnosis Date  . Depression   . Diastolic dysfunction    a. 09/2019 Echo: EF 55-60%, no rwma, mod LVH, gr2 DD. Nl RV size/fxn. Mildly dil LA. Ao root 4.5cm.  . Dilated aortic root ([place])    a. 09/2019 Echo: Ao root 4.5cm.  . Elevated troponin level not due myocardial infarction   . ESRD (end stage renal disease) ([place])   . Hypertension   . Nonobstructive Coronary Artery Disease    a. 09/2019 Cath: LM nl, LAD min irregs, D1 nl, D2 min irregs, LCX large, min irregs, RCA large, 20p, 30p/m.  . PE (pulmonary embolism)   . Renal insufficiency     Patient Active Problem List   Diagnosis Date Noted  . CAD (coronary artery disease) 01/05/2020  . Chronic anticoagulation 01/05/2020  . Acute bronchitis 01/05/2020  . History of substance abuse ([place]) 10/24/2019  . Elevated troponin 10/24/2019  . Chest pain 10/24/2019  . Chronic diastolic CHF (congestive heart failure) ([place]) 09/28/2019  . History of MI (myocardial  infarction) 09/28/2019  . Anemia of chronic kidney failure, stage 5 ([place]) 09/28/2019  . History of pulmonary embolism 09/28/2019  . Chronic midline low back pain   . Acute respiratory failure with hypoxia ([place])   . Acute diastolic CHF (congestive heart failure) ([place])   . Anemia of chronic disease   . Thrombocytopenia ([place])   . Congestive heart failure ([place])   . Fluid overload 06/21/2019  . Volume overload 11/15/2018  . ESRD (end stage renal disease) on dialysis ([place]) 11/14/2018  . HCAP (healthcare-associated pneumonia) 08/27/2018  . Dyspnea 08/25/2018  . Chest pain of uncertain etiology 99/24/2683  . ESRD on dialysis ([place]) 11/08/2016  . Prostate cancer screening 11/08/2016  . Acquired cyst of kidney 11/08/2016  . Urinary urgency 11/08/2016  . Acute on chronic renal failure ([place]) 09/23/2016  . Hypertensive urgency 09/21/2016  . Dysthymia 02/26/2016  . Chronic pain 02/26/2016  . Noncompliance with renal dialysis ([place]) 02/26/2016  . Opiate abuse, continuous ([place]) 02/03/2016  . Substance induced mood disorder ([place]) 02/03/2016  . Antisocial personality disorder ([place]) 02/03/2016  . Left-sided weakness 01/18/2016  . HEPATITIS C 06/28/2008  . HLD (hyperlipidemia) 06/28/2008  . SUBSTANCE ABUSE, MULTIPLE 06/28/2008  . Hypertension 06/27/2008    Past Surgical History:  Procedure Laterality Date  . LEFT HEART CATH AND CORONARY ANGIOGRAPHY N/A 09/30/2019   Procedure: LEFT HEART CATH AND CORONARY ANGIOGRAPHY;  Surgeon: Wellington Hampshire, MD;  Location: [place] [facility];  Service: Cardiovascular;  Laterality: N/A;  .  TOTAL HIP ARTHROPLASTY      Prior to Admission medications   Medication Sig Start Date End Date Taking? Authorizing Provider  albuterol (VENTOLIN HFA) 108 (90 Base) MCG/ACT inhaler Inhale 2 puffs into the lungs every 6 (six) hours as needed for up to 7 days for wheezing or shortness of breath. 01/02/20 01/09/20  Vanessa Stanchfield, MD  amLODipine (NORVASC) 10 MG tablet Take 1  tablet (10 mg total) by mouth daily. 06/23/19 06/22/20  Jennye Boroughs, MD  apixaban (ELIQUIS) 2.5 MG TABS tablet Take 1 tablet (2.5 mg total) by mouth 2 (two) times daily. 06/23/19   Jennye Boroughs, MD  atorvastatin (LIPITOR) 40 MG tablet Take 1 tablet (40 mg total) by mouth at bedtime. 10/24/19 11/23/19  Max Sane, MD  AURYXIA 1 GM 210 MG(Fe) tablet Take 210 mg by mouth See admin instructions. Take 1 tablet (210mg ) by mouth three times daily with meals and take 1 tablet (210mg ) by mouth with snacks 06/28/19   [provider]  doxycycline (ADOXA) 100 MG tablet Take 1 tablet (100 mg total) by mouth 2 (two) times daily for 7 days. 01/02/20 01/09/20  Vanessa [place], MD  hydrALAZINE (APRESOLINE) 100 MG tablet Take 1 tablet (100 mg total) by mouth 3 (three) times daily. 06/23/19   Jennye Boroughs, MD  irbesartan (AVAPRO) 75 MG tablet Take 1 tablet (75 mg total) by mouth at bedtime. 10/24/19   Max Sane, MD  isosorbide mononitrate (IMDUR) 60 MG 24 hr tablet Take 1 tablet (60 mg total) by mouth daily. 10/24/19   Max Sane, MD  ondansetron (ZOFRAN) 4 MG tablet Take 1 tablet (4 mg total) by mouth every 6 (six) hours as needed for nausea. 10/24/19   Max Sane, MD  oxyCODONE (OXY IR/ROXICODONE) 5 MG immediate release tablet Take 5-10 mg by mouth every 6 (six) hours as needed for severe pain.    [provider]  oxyCODONE (OXYCONTIN) 20 mg 12 hr tablet Take 20 mg by mouth every 12 (twelve) hours.    [provider]  pantoprazole (PROTONIX) 40 MG tablet Take 1 tablet (40 mg total) by mouth daily. 10/24/19   Max Sane, MD  predniSONE (DELTASONE) 20 MG tablet Take 2 tablets (40 mg total) by mouth daily for 5 days. 01/02/20 01/07/20  Vanessa Ruffin, MD    Allergies Cyclobenzaprine, Clonidine, Furosemide, and Tylenol [acetaminophen]  No family history on file.  Social History Social History   Tobacco Use  . Smoking status: Current Every Day Smoker    Packs/day: 0.50    Types:  Cigarettes  . Smokeless tobacco: Never Used  Substance Use Topics  . Alcohol use: No  . Drug use: No    Review of Systems  Constitutional: No fever/chills Eyes: No visual changes. ENT: No sore throat. Cardiovascular: Positive for chest tightness. Respiratory: Positive for nonproductive cough and shortness of breath. Gastrointestinal: No abdominal pain.  No nausea, no vomiting.  No diarrhea.  No constipation. Genitourinary: Negative for dysuria. Musculoskeletal: Negative for back pain. Skin: Negative for rash. Neurological: Negative for headaches, focal weakness or numbness.   ____________________________________________   PHYSICAL EXAM:  VITAL SIGNS: ED Triage Vitals  Enc Vitals Group     BP 01/05/20 0123 (!) 151/95     Pulse Rate 01/05/20 0123 85     Resp 01/05/20 0123 (!) 24     Temp 01/05/20 0123 98 F (36.7 C)     Temp Source 01/05/20 0123 Oral     SpO2 01/05/20 0123 96 %  Weight 01/05/20 0124 240 lb (108.9 kg)     Height 01/05/20 0124 6' (1.829 m)     Head Circumference --      Peak Flow --      Pain Score 01/05/20 0124 8     Pain Loc --      Pain Edu? --      Excl. in [place]? --     Constitutional: Alert and oriented.  Chronically ill appearing and in mild acute distress. Eyes: Conjunctivae are normal. PERRL. EOMI. Head: Atraumatic. Nose: No congestion/rhinnorhea. Mouth/Throat: Mucous membranes are moist.   Neck: No stridor.   Cardiovascular: Normal rate, regular rhythm. Grossly normal heart sounds.  Good peripheral circulation. Respiratory: Normal respiratory effort.  No retractions. Lungs with scattered wheezing.  Loose, rattly cough noted. Gastrointestinal: Soft and nontender. No distention. No abdominal bruits. No CVA tenderness. Musculoskeletal: No lower extremity tenderness nor edema.  No joint effusions. Neurologic:  Normal speech and language. No gross focal neurologic deficits are appreciated. No gait instability. Skin:  Skin is warm, dry and  intact. No rash noted. Psychiatric: Mood and affect are normal. Speech and behavior are normal.  ____________________________________________   LABS (all labs ordered are listed, but only abnormal results are displayed)  Labs Reviewed  BASIC METABOLIC PANEL - Abnormal; Notable for the following components:      Result Value   Glucose, Bld 118 (*)    BUN 40 (*)    Creatinine, Ser 6.33 (*)    Calcium 8.4 (*)    GFR calc non Af Amer 9 (*)    GFR calc Af Amer 10 (*)    All other components within normal limits  CBC - Abnormal; Notable for the following components:   RBC 3.32 (*)    Hemoglobin 9.9 (*)    HCT 29.0 (*)    RDW 15.8 (*)    Platelets 90 (*)    nRBC 0.4 (*)    All other components within normal limits  BRAIN NATRIURETIC PEPTIDE - Abnormal; Notable for the following components:   B Natriuretic Peptide >4,500.0 (*)    All other components within normal limits  TROPONIN I (HIGH SENSITIVITY) - Abnormal; Notable for the following components:   Troponin I (High Sensitivity) 68 (*)    All other components within normal limits  TROPONIN I (HIGH SENSITIVITY) - Abnormal; Notable for the following components:   Troponin I (High Sensitivity) 75 (*)    All other components within normal limits  SARS CORONAVIRUS 2 BY RT PCR (HOSPITAL ORDER, [facility])   ____________________________________________  EKG  ED ECG REPORT I, Denton Derks J, the attending physician, personally viewed and interpreted this ECG.   Date: 01/05/2020  EKG Time: 0116  Rate: 85  Rhythm: atrial fibrillation, rate 85  Axis: Normal  Intervals:none  ST&T Change: Nonspecific No significant change from 01/03/2020 ____________________________________________  RADIOLOGY  ED MD interpretation: Chronic vascular congestion cardiomegaly  Official radiology report(s): DG Chest 2 View  Result Date: 01/05/2020 CLINICAL DATA:  Chest pain, short of breath, nausea and vomiting EXAM: CHEST  - 2 VIEW COMPARISON:  01/02/2020 FINDINGS: Frontal and lateral views of the chest demonstrates stable enlargement of the cardiac silhouette. Thoracic aortic ectasia is unchanged. Chronic central vascular congestion without airspace disease, effusion, or pneumothorax. No acute bony abnormalities. IMPRESSION: 1. Chronic vascular congestion and cardiomegaly.  No acute process. Electronically Signed   By: Randa Ngo M.D.   On: 01/05/2020 02:12    ____________________________________________  PROCEDURES  Procedure(s) performed (including Critical Care):  .1-3 Lead EKG Interpretation Performed by: Paulette Blanch, MD Authorized by: Paulette Blanch, MD     Interpretation: abnormal     ECG rate:  80   ECG rate assessment: normal     Rhythm: atrial fibrillation     Ectopy: none     Conduction: normal   Comments:     Patient placed on cardiac monitor to evaluate for arrhythmias   CRITICAL CARE Performed by: Paulette Blanch   Total critical care time: 30 minutes  Critical care time was exclusive of separately billable procedures and treating other patients.  Critical care was necessary to treat or prevent imminent or life-threatening deterioration.  Critical care was time spent personally by me on the following activities: development of treatment plan with patient and/or surrogate as well as nursing, discussions with consultants, evaluation of patient's response to treatment, examination of patient, obtaining history from patient or surrogate, ordering and performing treatments and interventions, ordering and review of laboratory studies, ordering and review of radiographic studies, pulse oximetry and re-evaluation of patient's condition.    ____________________________________________   INITIAL IMPRESSION / ASSESSMENT AND PLAN / ED COURSE  As part of my medical decision making, I reviewed the following data within the [place] notes reviewed and incorporated,  Labs reviewed, EKG interpreted, Old chart reviewed, Radiograph reviewed and Notes from prior ED visits     [name] was evaluated in Emergency Department on 01/05/2020 for the symptoms described in the history of present illness. He was evaluated in the context of the global COVID-19 pandemic, which necessitated consideration that the patient might be at risk for infection with the SARS-CoV-2 virus that causes COVID-19. Institutional protocols and algorithms that pertain to the evaluation of patients at risk for COVID-19 are in a state of rapid change based on information released by regulatory bodies including the CDC and federal and state organizations. These policies and algorithms were followed during the patient's care in the ED.    64 year old male with continued cough, chest tightness and shortness of breath. Differential diagnosis includes, but is not limited to, ACS, aortic dissection, pulmonary embolism, cardiac tamponade, pneumothorax, pneumonia, pericarditis, myocarditis, GI-related causes including esophagitis/gastritis, and musculoskeletal chest wall pain.    Laboratory results demonstrate stable anemia, kidney function and troponin.  Will repeat troponin.  Administer DuoNeb, Tussionex for cough.  Will ambulate patient on pulse oximeter.   Clinical Course as of Jan 04 609  Sun Jan 05, 2020  0436 Patient ambulated with pulse oximeter well without hypoxia.   [JS]  8299 More wheezy, coughing and short of breath after first DuoNeb treatment.  Second DuoNeb treatment finished; patient not significantly improved.  Will discuss with hospitalist services for admission.   [JS]    Clinical Course User Index [JS] Paulette Blanch, MD     ____________________________________________   FINAL CLINICAL IMPRESSION(S) / ED DIAGNOSES  Final diagnoses:  Nonspecific chest pain  SOB (shortness of breath)  Cough  Acute on chronic congestive heart failure, unspecified heart failure type  ([place])  ESRD (end stage renal disease) on dialysis [facility])     ED Discharge Orders    None       Note:  This document was prepared using Dragon voice recognition software and may include unintentional dictation errors.   Paulette Blanch, MD 01/05/20 [phone]

## 2020-01-05 NOTE — H&P (Addendum)
History and Physical    [name] IOX:[id number] DOB: April 19, 1956 DOA: 01/05/2020  PCP: Patient, No Pcp Per   Patient coming from: Home  I have personally briefly reviewed patient's old medical records in [place]  Chief Complaint: Shortness of breath                                Chest pain  HPI: [name] is a 64 y.o. male with medical history significant for end-stage renal disease on hemodialysis (M/W/F), history of chronic diastolic dysfunction CHF, hypertension, nonobstructive coronary artery disease, PE on Eliquis who presents to the ER for evaluation of chest pain which is pleuritic in nature and associated with a cough productive of greenish phlegm.  He states he has had fever and chills which is not documented.  He was seen in the ER on 06/24 for same and was treated with nebulizer treatments and discharged on doxycycline, albuterol and prednisone. He denies having any nausea, vomiting, abdominal pain, dizziness, lightheadedness or any changes in his bowel habits. Chest x-ray shows chronic vascular congestion and cardiomegaly.  No acute process. Twelve-lead EKG shows A. fib with ST and T wave abnormality in the lateral leads  ED Course: Patient is a 64 year old African-American male who presents to the ER for the second time in 1 week for evaluation of chest pain which is pleuritic associated with a cough and shortness of breath.  Patient was treated with bronchodilators during his last ER visit with improvement and discharged home but he did not respond to bronchodilators during this ER visit and remained short of breath despite multiple treatments.  There was no hypoxia.  He will be referred to observation status for further evaluation  Review of Systems: As per HPI otherwise 10 point review of systems negative.    Past Medical History:  Diagnosis Date  . Depression   . Diastolic dysfunction    a. 09/2019 Echo: EF 55-60%, no rwma, mod LVH, gr2 DD. Nl RV  size/fxn. Mildly dil LA. Ao root 4.5cm.  . Dilated aortic root ([place])    a. 09/2019 Echo: Ao root 4.5cm.  . Elevated troponin level not due myocardial infarction   . ESRD (end stage renal disease) ([place])   . Hypertension   . Nonobstructive Coronary Artery Disease    a. 09/2019 Cath: LM nl, LAD min irregs, D1 nl, D2 min irregs, LCX large, min irregs, RCA large, 20p, 30p/m.  . PE (pulmonary embolism)   . Renal insufficiency     Past Surgical History:  Procedure Laterality Date  . LEFT HEART CATH AND CORONARY ANGIOGRAPHY N/A 09/30/2019   Procedure: LEFT HEART CATH AND CORONARY ANGIOGRAPHY;  Surgeon: Wellington Hampshire, MD;  Location: [facility];  Service: Cardiovascular;  Laterality: N/A;  . TOTAL HIP ARTHROPLASTY       reports that he has been smoking cigarettes. He has been smoking about 0.50 packs per day. He has never used smokeless tobacco. He reports that he does not drink alcohol and does not use drugs.  Allergies  Allergen Reactions  . Cyclobenzaprine Nausea Only  . Clonidine Other (See Comments)    Asymptomatic bradycardia to 38  . Furosemide Other (See Comments)    Caused renal failure  . Tylenol [Acetaminophen] Other (See Comments)    Reaction:  Pt states it bothers his liver    History reviewed. No pertinent family history.   Prior to  Admission medications   Medication Sig Start Date End Date Taking? Authorizing Provider  albuterol (VENTOLIN HFA) 108 (90 Base) MCG/ACT inhaler Inhale 2 puffs into the lungs every 6 (six) hours as needed for up to 7 days for wheezing or shortness of breath. 01/02/20 01/09/20  Vanessa Upton, MD  amLODipine (NORVASC) 10 MG tablet Take 1 tablet (10 mg total) by mouth daily. 06/23/19 06/22/20  Jennye Boroughs, MD  apixaban (ELIQUIS) 2.5 MG TABS tablet Take 1 tablet (2.5 mg total) by mouth 2 (two) times daily. 06/23/19   Jennye Boroughs, MD  atorvastatin (LIPITOR) 40 MG tablet Take 1 tablet (40 mg total) by mouth at bedtime. 10/24/19 11/23/19   Max Sane, MD  AURYXIA 1 GM 210 MG(Fe) tablet Take 210 mg by mouth See admin instructions. Take 1 tablet (210mg ) by mouth three times daily with meals and take 1 tablet (210mg ) by mouth with snacks 06/28/19   [provider]  doxycycline (ADOXA) 100 MG tablet Take 1 tablet (100 mg total) by mouth 2 (two) times daily for 7 days. 01/02/20 01/09/20  Vanessa [place], MD  hydrALAZINE (APRESOLINE) 100 MG tablet Take 1 tablet (100 mg total) by mouth 3 (three) times daily. 06/23/19   Jennye Boroughs, MD  irbesartan (AVAPRO) 75 MG tablet Take 1 tablet (75 mg total) by mouth at bedtime. 10/24/19   Max Sane, MD  isosorbide mononitrate (IMDUR) 60 MG 24 hr tablet Take 1 tablet (60 mg total) by mouth daily. 10/24/19   Max Sane, MD  ondansetron (ZOFRAN) 4 MG tablet Take 1 tablet (4 mg total) by mouth every 6 (six) hours as needed for nausea. 10/24/19   Max Sane, MD  oxyCODONE (OXY IR/ROXICODONE) 5 MG immediate release tablet Take 5-10 mg by mouth every 6 (six) hours as needed for severe pain.    [provider]  oxyCODONE (OXYCONTIN) 20 mg 12 hr tablet Take 20 mg by mouth every 12 (twelve) hours.    [provider]  pantoprazole (PROTONIX) 40 MG tablet Take 1 tablet (40 mg total) by mouth daily. 10/24/19   Max Sane, MD  predniSONE (DELTASONE) 20 MG tablet Take 2 tablets (40 mg total) by mouth daily for 5 days. 01/02/20 01/07/20  Vanessa [place], MD    Physical Exam: Vitals:   01/05/20 0123 01/05/20 0124 01/05/20 0455 01/05/20 0700  BP: (!) 151/95  (!) 164/117 (!) 164/110  Pulse: 85  85 80  Resp: (!) 24  18 17   Temp: 98 F (36.7 C)     TempSrc: Oral     SpO2: 96%  100% 100%  Weight:  108.9 kg    Height:  6' (1.829 m)       Vitals:   01/05/20 0123 01/05/20 0124 01/05/20 0455 01/05/20 0700  BP: (!) 151/95  (!) 164/117 (!) 164/110  Pulse: 85  85 80  Resp: (!) 24  18 17   Temp: 98 F (36.7 C)     TempSrc: Oral     SpO2: 96%  100% 100%  Weight:  108.9 kg    Height:  6'  (1.829 m)      Constitutional: NAD, alert and oriented x 3 Eyes: PERRL, lids and conjunctivae normal ENMT: Mucous membranes are moist.  Neck: normal, supple, no masses, no thyromegaly Respiratory: Scattered wheezes,  no crackles. Normal respiratory effort. No accessory muscle use.  Cardiovascular: Irregularly irregular, no murmurs / rubs / gallops. No extremity edema. 2+ pedal pulses. No carotid bruits.  Abdomen: no tenderness, no  masses palpated. No hepatosplenomegaly. Bowel sounds positive.  Musculoskeletal: no clubbing / cyanosis. No joint deformity upper and lower extremities.  Skin: no rashes, lesions, ulcers.  Neurologic: No gross focal neurologic deficit. Psychiatric: Normal mood and affect.   Labs on Admission: I have personally reviewed following labs and imaging studies  CBC: Recent Labs  Lab 01/02/20 1610 01/05/20 0143  WBC 3.2* 5.7  HGB 10.7* 9.9*  HCT 32.3* 29.0*  MCV 89.5 87.3  PLT 81* 90*   Basic Metabolic Panel: Recent Labs  Lab 01/02/20 1610 01/05/20 0143  NA 138 138  K 4.0 4.2  CL 100 99  CO2 27 28  GLUCOSE 98 118*  BUN 30* 40*  CREATININE 6.97* 6.33*  CALCIUM 8.8* 8.4*   GFR: Estimated Creatinine Clearance: 15.6 mL/min (A) (by C-G formula based on SCr of 6.33 mg/dL (H)). Liver Function Tests: Recent Labs  Lab 01/02/20 1757  AST 16  ALT 16  ALKPHOS 48  BILITOT 1.1  PROT 6.6  ALBUMIN 3.7   Recent Labs  Lab 01/02/20 1757  LIPASE 19   No results for input(s): AMMONIA in the last 168 hours. Coagulation Profile: No results for input(s): INR, PROTIME in the last 168 hours. Cardiac Enzymes: No results for input(s): CKTOTAL, CKMB, CKMBINDEX, TROPONINI in the last 168 hours. BNP (last 3 results) No results for input(s): PROBNP in the last 8760 hours. HbA1C: No results for input(s): HGBA1C in the last 72 hours. CBG: No results for input(s): GLUCAP in the last 168 hours. Lipid Profile: No results for input(s): CHOL, HDL, LDLCALC, TRIG,  CHOLHDL, LDLDIRECT in the last 72 hours. Thyroid Function Tests: No results for input(s): TSH, T4TOTAL, FREET4, T3FREE, THYROIDAB in the last 72 hours. Anemia Panel: No results for input(s): VITAMINB12, FOLATE, FERRITIN, TIBC, IRON, RETICCTPCT in the last 72 hours. Urine analysis:    Component Value Date/Time   COLORURINE YELLOW (A) 08/20/2018 0230   APPEARANCEUR CLOUDY (A) 08/20/2018 0230   APPEARANCEUR Clear 11/08/2014 1559   LABSPEC 1.010 08/20/2018 0230   LABSPEC 1.010 11/08/2014 1559   PHURINE 9.0 (H) 08/20/2018 0230   GLUCOSEU NEGATIVE 08/20/2018 0230   GLUCOSEU Negative 11/08/2014 1559   HGBUR NEGATIVE 08/20/2018 0230   BILIRUBINUR NEGATIVE 08/20/2018 0230   BILIRUBINUR Negative 11/08/2014 1559   KETONESUR NEGATIVE 08/20/2018 0230   PROTEINUR 100 (A) 08/20/2018 0230   UROBILINOGEN 1.0 10/28/2010 1100   NITRITE POSITIVE (A) 08/20/2018 0230   LEUKOCYTESUR LARGE (A) 08/20/2018 0230   LEUKOCYTESUR Negative 11/08/2014 1559    Radiological Exams on Admission: DG Chest 2 View  Result Date: 01/05/2020 CLINICAL DATA:  Chest pain, short of breath, nausea and vomiting EXAM: CHEST - 2 VIEW COMPARISON:  01/02/2020 FINDINGS: Frontal and lateral views of the chest demonstrates stable enlargement of the cardiac silhouette. Thoracic aortic ectasia is unchanged. Chronic central vascular congestion without airspace disease, effusion, or pneumothorax. No acute bony abnormalities. IMPRESSION: 1. Chronic vascular congestion and cardiomegaly.  No acute process. Electronically Signed   By: Randa Ngo M.D.   On: 01/05/2020 02:12    EKG: Independently reviewed.  Atrial fibrillation with ST and T wave changes in the lateral leads   Assessment/Plan Principal Problem:   Acute bronchitis Active Problems:   ESRD (end stage renal disease) on dialysis (HCC)   Chronic diastolic CHF (congestive heart failure) (HCC)   History of pulmonary embolism   CAD (coronary artery disease)   Chronic  anticoagulation    Acute bronchitis Patient presents for evaluation of shortness of breath,  pleuritic chest pain and cough productive of greenish phlegm He is afebrile and has no white count We will place patient on scheduled and as needed bronchodilator therapy as well as inhaled steroids Treat empirically with Rocephin    End-stage renal disease on hemodialysis Dialysis days are Monday/Wednesday/Friday We will request nephrology consult for renal replacement therapy   History of paroxysmal atrial fibrillation Patient has a CHADS2VASC score of 2 and ideally requires anticoagulation in the form of prophylaxis for an acute stroke 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  He verbalizes understanding and agrees with the plan. Disposition Plan: Back to previous home environment Consults called: Nephrology    Collier Bullock MD [facility]     01/05/2020, 8:09 AM

## 2020-01-06 DIAGNOSIS — I480 Paroxysmal atrial fibrillation: Secondary | ICD-10-CM | POA: Diagnosis not present

## 2020-01-06 DIAGNOSIS — R059 Cough, unspecified: Secondary | ICD-10-CM

## 2020-01-06 DIAGNOSIS — I509 Heart failure, unspecified: Secondary | ICD-10-CM

## 2020-01-06 DIAGNOSIS — R079 Chest pain, unspecified: Secondary | ICD-10-CM

## 2020-01-06 DIAGNOSIS — J209 Acute bronchitis, unspecified: Secondary | ICD-10-CM | POA: Diagnosis not present

## 2020-01-06 DIAGNOSIS — R0602 Shortness of breath: Secondary | ICD-10-CM

## 2020-01-06 DIAGNOSIS — R05 Cough: Secondary | ICD-10-CM

## 2020-01-06 MED ORDER — HEPARIN SODIUM (PORCINE) 1000 UNIT/ML DIALYSIS
1000.0000 [IU] | INTRAMUSCULAR | Status: DC | PRN
  Filled 2020-01-06: qty 1

## 2020-01-06 MED ORDER — PENTAFLUOROPROP-TETRAFLUOROETH EX AERO
1.0000 "application " | INHALATION_SPRAY | CUTANEOUS | Status: DC | PRN
  Filled 2020-01-06: qty 30

## 2020-01-06 MED ORDER — LIDOCAINE-PRILOCAINE 2.5-2.5 % EX CREA
1.0000 "application " | TOPICAL_CREAM | CUTANEOUS | Status: DC | PRN
  Filled 2020-01-06: qty 5

## 2020-01-06 MED ORDER — LIDOCAINE HCL (PF) 1 % IJ SOLN
5.0000 mL | INTRAMUSCULAR | Status: DC | PRN
  Filled 2020-01-06: qty 5

## 2020-01-06 MED ORDER — SODIUM CHLORIDE 0.9 % IV SOLN: 100.0000 mL | INTRAVENOUS | Status: DC | PRN

## 2020-01-06 MED ORDER — DM-GUAIFENESIN ER 30-600 MG PO TB12
1.0000 | ORAL_TABLET | Freq: Two times a day (BID) | ORAL | 0 refills | Status: DC | PRN
Start: 2020-01-06 — End: 2021-08-07

## 2020-01-06 MED ORDER — ALTEPLASE 2 MG IJ SOLR
2.0000 mg | Freq: Once | INTRAMUSCULAR | Status: DC | PRN
  Filled 2020-01-06: qty 2

## 2020-01-06 MED ORDER — AZITHROMYCIN 250 MG PO TABS
ORAL_TABLET | ORAL | 0 refills | Status: AC
Start: 2020-01-06 — End: 2020-01-11

## 2020-01-06 NOTE — Progress Notes (Signed)
Fresh bath items brought to pt this morning for bath. Offered to help pt with bath if needed. Pt declined assistance to bathe.

## 2020-01-06 NOTE — TOC Progression Note (Signed)
Transition of Care [facility]) - Progression Note    Patient Details  Name: [name] MRN: [id number] Date of Birth: [birth year]  Transition of Care [facility]) CM/SW [place], RN Phone Number: 01/06/2020, 3:53 PM  Clinical Narrative:    Provided the patient with a taxi voucher to get home for DC, [facility] is [facility] with [facility]    Expected Discharge Plan: [place] Barriers to Discharge: Continued Medical Work up  Expected Discharge Plan and Services Expected Discharge Plan: [place] Choice: [place] arrangements for the past 2 months: Single Family Home Expected Discharge Date: 01/06/20                         HH Arranged: PT [place]: [place] ([place]) Date HH Agency Contacted: 01/05/20 Time [place]: [place] Representative spoke with at [place]: [place] (SDOH) Interventions    Readmission Risk Interventions Readmission Risk Prevention Plan 11/16/2018 11/14/2018  Transportation Screening Complete Complete  Medication Review Press photographer) Complete Complete  PCP or Specialist appointment within 3-5 days of discharge Complete -  [place] or Home Care Consult Patient refused -  Palliative Care Screening Not Applicable -  [place] Not Applicable Not Applicable  Some recent data might be hidden

## 2020-01-06 NOTE — Progress Notes (Signed)
HD started. 

## 2020-01-06 NOTE — TOC Progression Note (Signed)
Transition of Care [facility]) - Progression Note    Patient Details  Name: SLATON REASER MRN: [id number] Date of Birth: 05-Dec-1955  Transition of Care [facility]) CM/SW [place], RN Phone Number: 01/06/2020, 2:28 PM  Clinical Narrative:     The patient is open with [place]  Expected Discharge Plan: [place] Barriers to Discharge: Continued Medical Work up  Expected Discharge Plan and Services Expected Discharge Plan: [place] Choice: [place] arrangements for the past 2 months: Single Family Home Expected Discharge Date: 01/06/20                         HH Arranged: PT [place]: [place] ([place]) Date HH Agency Contacted: 01/05/20 Time [place]: [place] Representative spoke with at [place]: [place] ([place]) Interventions    Readmission Risk Interventions Readmission Risk Prevention Plan 11/16/2018 11/14/2018  Transportation Screening Complete Complete  Medication Review Press photographer) Complete Complete  PCP or Specialist appointment within 3-5 days of discharge Complete -  [place] or [place] Patient refused -  Palliative Care Screening Not Applicable -  [place] Not Applicable Not Applicable  Some recent data might be hidden

## 2020-01-06 NOTE — Progress Notes (Signed)
Consent for Hemodialysis obtained and witnessed by this writer , placed in patients medical chart

## 2020-01-06 NOTE — Progress Notes (Signed)
Patient left with belongings without discharge summary , IV was removed from RFA. Patient ready to leave

## 2020-01-06 NOTE — Discharge Summary (Signed)
Physician Discharge Summary  [name] KGM:[id number] DOB: [birth year] DOA: 01/05/2020  PCP: Patient, No Pcp Per  Admit date: 01/05/2020 Discharge date: 01/06/2020  Admitted From: Home Disposition:  Home  Recommendations for Outpatient Follow-up:  1. Follow up with PCP in 1-2 weeks 2. Please obtain BMP/CBC in one week 3. Please follow up on the following pending results:None  Home Health: yes Equipment/Devices: None Discharge Condition: Stable CODE STATUS: Full Diet recommendation: Heart Healthy / Carb Modified /  Brief/Interim Summary: [name] is a 64 y.o. male with medical history significant for end-stage renal disease on hemodialysis (M/W/F), history of chronic diastolic dysfunction CHF, hypertension, nonobstructive coronary artery disease, PE on Eliquis who presents to the ER for evaluation of chest pain which is pleuritic in nature and associated with a cough productive of greenish phlegm.  He states he has had fever and chills which is not documented.  He was seen in the ER on 06/24 for same and was treated with nebulizer treatments and discharged on doxycycline, albuterol and prednisone. He denies having any nausea, vomiting, abdominal pain, dizziness, lightheadedness or any changes in his bowel habits. Chest x-ray shows chronic vascular congestion and cardiomegaly. No acute process.  Patient had mildly elevated troponin with a flat curve, most likely demand and he is also ESRD.  BNP markedly elevated above 4500.  He appears volume overload.  He was dialyzed in the hospital.  He was saturating well on room air.  He continued to complain some cough.  He was discharged on Z-Pak and will continue his prednisone until he will finish.  He needs to follow-up with primary care physician for further management. His chest pain was thought to be pleuritic chest pain and he received treatment for acute bronchitis.  Patient was advised to continue with his schedule of  dialysis.  Patient has an history of A. fib and will continue his home dose of Eliquis.  He will continue rest of his home meds and follow-up with nephrology and primary care physician.  Discharge Diagnoses:  Principal Problem:   Acute bronchitis Active Problems:   SOB (shortness of breath)   ESRD (end stage renal disease) on dialysis (HCC)   Anemia of chronic disease   Chronic diastolic CHF (congestive heart failure) (HCC)   History of pulmonary embolism   Nonspecific chest pain   CAD (coronary artery disease)   Chronic anticoagulation   AF (paroxysmal atrial fibrillation) (HCC)   Cough   Discharge Instructions  Discharge Instructions    Diet - low sodium heart healthy   Complete by: As directed    Discharge instructions   Complete by: As directed    Was pleasure taking care of you. You are being given antibiotics for 5 days, please take it as directed. Continue taking your prednisone until you will finish it. Follow your schedule for dialysis regularly. Follow-up with your primary care physician.   Increase activity slowly   Complete by: As directed    No wound care   Complete by: As directed      Allergies as of 01/06/2020      Reactions   Cyclobenzaprine Nausea Only   Clonidine Other (See Comments)   Asymptomatic bradycardia to 38   Furosemide Other (See Comments)   Caused renal failure   Tylenol [acetaminophen] Other (See Comments)   Reaction:  Pt states it bothers his liver      Medication List    STOP taking these medications   doxycycline 100 MG tablet  Commonly known as: ADOXA     TAKE these medications   albuterol 108 (90 Base) MCG/ACT inhaler Commonly known as: VENTOLIN HFA Inhale 2 puffs into the lungs every 6 (six) hours as needed for up to 7 days for wheezing or shortness of breath.   amLODipine 10 MG tablet Commonly known as: NORVASC Take 1 tablet (10 mg total) by mouth daily.   apixaban 2.5 MG Tabs tablet Commonly known as:  Eliquis Take 1 tablet (2.5 mg total) by mouth 2 (two) times daily.   atorvastatin 40 MG tablet Commonly known as: LIPITOR Take 1 tablet (40 mg total) by mouth at bedtime.   Auryxia 1 GM 210 MG(Fe) tablet Generic drug: ferric citrate Take 210 mg by mouth See admin instructions. Take 1 tablet (210mg ) by mouth three times daily with meals and take 1 tablet (210mg ) by mouth with snacks   azithromycin 250 MG tablet Commonly known as: Zithromax Z-Pak Take 2 tablets (500 mg) on  Day 1,  followed by 1 tablet (250 mg) once daily on Days 2 through 5.   dextromethorphan-guaiFENesin 30-600 MG 12hr tablet Commonly known as: MUCINEX DM Take 1 tablet by mouth 2 (two) times daily as needed for cough.   hydrALAZINE 100 MG tablet Commonly known as: APRESOLINE Take 1 tablet (100 mg total) by mouth 3 (three) times daily.   irbesartan 75 MG tablet Commonly known as: AVAPRO Take 1 tablet (75 mg total) by mouth at bedtime.   isosorbide mononitrate 60 MG 24 hr tablet Commonly known as: IMDUR Take 1 tablet (60 mg total) by mouth daily.   ondansetron 4 MG tablet Commonly known as: ZOFRAN Take 1 tablet (4 mg total) by mouth every 6 (six) hours as needed for nausea.   OxyCONTIN 20 mg 12 hr tablet Generic drug: oxyCODONE Take 20 mg by mouth every 12 (twelve) hours.   oxyCODONE 5 MG immediate release tablet Commonly known as: Oxy IR/ROXICODONE Take 5-10 mg by mouth every 6 (six) hours as needed for severe pain.   pantoprazole 40 MG tablet Commonly known as: Protonix Take 1 tablet (40 mg total) by mouth daily.   predniSONE 20 MG tablet Commonly known as: Deltasone Take 2 tablets (40 mg total) by mouth daily for 5 days.       Allergies  Allergen Reactions  . Cyclobenzaprine Nausea Only  . Clonidine Other (See Comments)    Asymptomatic bradycardia to 38  . Furosemide Other (See Comments)    Caused renal failure  . Tylenol [Acetaminophen] Other (See Comments)    Reaction:  Pt states it  bothers his liver    Consultations:  Nephrology.  Procedures/Studies: DG Chest 2 View  Result Date: 01/05/2020 CLINICAL DATA:  Chest pain, short of breath, nausea and vomiting EXAM: CHEST - 2 VIEW COMPARISON:  01/02/2020 FINDINGS: Frontal and lateral views of the chest demonstrates stable enlargement of the cardiac silhouette. Thoracic aortic ectasia is unchanged. Chronic central vascular congestion without airspace disease, effusion, or pneumothorax. No acute bony abnormalities. IMPRESSION: 1. Chronic vascular congestion and cardiomegaly.  No acute process. Electronically Signed   By: Randa Ngo M.D.   On: 01/05/2020 02:12   DG Chest 2 View  Result Date: 01/02/2020 CLINICAL DATA:  Chest pain and shortness of breath. EXAM: CHEST - 2 VIEW COMPARISON:  December 17, 2019 FINDINGS: Mild diffusely increased interstitial lung markings are seen with mild prominence of the pulmonary vasculature. There is no evidence of a pleural effusion or pneumothorax. The cardiac silhouette is moderately  enlarged. The visualized skeletal structures are unremarkable. IMPRESSION: 1. Moderate cardiomegaly with mild pulmonary vascular congestion. Electronically Signed   By: Virgina Norfolk M.D.   On: 01/02/2020 16:24   DG Chest 2 View  Result Date: 12/17/2019 CLINICAL DATA:  Mid chest pain radiating to back, shortness of breath EXAM: CHEST - 2 VIEW COMPARISON:  11/17/2019 FINDINGS: Frontal and lateral views of the chest demonstrates stable enlargement of the cardiac silhouette. Thoracic aorta is ectatic. No airspace disease, effusion, or pneumothorax. No acute bony abnormalities. IMPRESSION: 1. Stable enlarged cardiac silhouette and thoracic aortic ectasia. 2. No acute intrathoracic process. Electronically Signed   By: Randa Ngo M.D.   On: 12/17/2019 23:20   CT Angio Chest PE W and/or Wo Contrast  Result Date: 12/18/2019 CLINICAL DATA:  Mid chest pain radiating into the back. EXAM: CT ANGIOGRAPHY CHEST WITH CONTRAST  TECHNIQUE: Multidetector CT imaging of the chest was performed using the standard protocol during bolus administration of intravenous contrast. Multiplanar CT image reconstructions and MIPs were obtained to evaluate the vascular anatomy. CONTRAST:  28mL OMNIPAQUE IOHEXOL 350 MG/ML SOLN COMPARISON:  09/27/2019 FINDINGS: Cardiovascular: Cardiomegaly. No pathologic pericardial fluid. No pulmonary artery filling defects when allowing for levels of artifact. 4 cm diameter ascending aorta which is unchanged compared to 2020 chest CT. Mediastinum/Nodes: No adenopathy or mass. Lungs/Pleura: Centrilobular emphysema and diffuse airway thickening. There is no edema, consolidation, effusion, or pneumothorax. Upper Abdomen: Mild upper retroperitoneal stranding that was also seen on prior. No acute or focal abnormality. Musculoskeletal: No acute or aggressive finding. Review of the MIP images confirms the above findings. IMPRESSION: 1. Negative for pulmonary embolism or other acute finding. 2. Cardiomegaly. 3. Aortic Atherosclerosis (ICD10-I70.0) and Emphysema (ICD10-J43.9). Electronically Signed   By: Monte Fantasia M.D.   On: 12/18/2019 07:12   CT Angio Chest/Abd/Pel for Dissection W and/or Wo Contrast  Result Date: 01/03/2020 CLINICAL DATA:  Chest pain and shortness of breath.  Cough. EXAM: CT ANGIOGRAPHY CHEST, ABDOMEN AND PELVIS TECHNIQUE: Non-contrast CT of the chest was initially obtained. Multidetector CT imaging through the chest, abdomen and pelvis was performed using the standard protocol during bolus administration of intravenous contrast. Multiplanar reconstructed images and MIPs were obtained and reviewed to evaluate the vascular anatomy. CONTRAST:  127mL OMNIPAQUE IOHEXOL 350 MG/ML SOLN COMPARISON:  Radiograph earlier today. Chest CTA PE protocol 2 weeks ago 12/18/2019 FINDINGS: CTA CHEST FINDINGS Cardiovascular: Stable aneurysmal dilatation of the ascending aorta at 4 cm, series 7, image 67. There is no  evidence of dissection, hematoma, or acute aortic syndrome. Mild aortic atherosclerosis. Conventional branching pattern from the aortic arch with patent branch vessels. Branch vessels are tortuous indicating hypertension. Cardiomegaly is similar to prior exam. Physiologic pericardial fluid which tracks into the superior pericardial recess. There are coronary artery calcifications. There is venous distension of the upper extremities. SVC is widely patent. No filling defects in the pulmonary arteries to the segmental level. Mediastinum/Nodes: Scattered small mediastinal and hilar lymph nodes that are not enlarged by size criteria. No esophageal wall thickening. No thyroid nodule. Lungs/Pleura: Emphysema and unchanged bronchial thickening. No acute airspace disease. No pulmonary edema, pleural effusion, or pulmonary mass. Small subpleural bleb at the left lung base. Musculoskeletal: There are no acute or suspicious osseous abnormalities. Review of the MIP images confirms the above findings. CTA ABDOMEN AND PELVIS FINDINGS VASCULAR Aorta: Moderate atherosclerosis. No dissection or acute findings. The infrarenal aorta is ectatic, 2.9 cm greatest dimension, series 7, image 77. No significant stenosis. Celiac: Patent without  evidence of aneurysm, dissection, vasculitis or significant stenosis. SMA: Patent without evidence of aneurysm, dissection, vasculitis or significant stenosis. Renals: Plaque at the origin of both renal arteries with moderate stenosis, right greater than left. No acute findings. IMA: Patent without evidence of aneurysm, dissection, vasculitis or significant stenosis. Inflow: Mild ectasia of the proximal left common iliac artery. Otherwise normal in caliber. Moderately calcified. No dissection or severe stenosis. No acute findings. Veins: No obvious venous abnormality within the limitations of this arterial phase study. Review of the MIP images confirms the above findings. NON-VASCULAR Hepatobiliary: No  focal hepatic abnormality. No calcified gallstone or biliary dilatation. Pancreas: Motion artifact through the proximal pancreas, equivocal wall peripancreatic edema versus motion. There is no ductal dilatation. Spleen: Enlarged spanning 15 cm cranial caudal. Adrenals/Urinary Tract: Low-density left adrenal nodularity consistent with adenoma. No evidence of right adrenal nodule. Chronically atrophic kidneys consistent with chronic renal disease. Mild perinephric edema about both kidneys. No hydronephrosis. Bladder is mildly distended. Stomach/Bowel: Colonic diverticulosis without diverticulitis. Sigmoid colon is tortuous. There is no bowel obstruction or inflammatory change. Normal appendix. Stomach is unremarkable. Lymphatic: Scattered prominent retroperitoneal nodes are likely reactive. Reproductive: Prostate is unremarkable. Other: Stranding in the upper retroperitoneum, grossly similar to prior, partially obscured by motion. Prominent generalized subcutaneous edema and skin thickening. No significant ascites. No free air. Musculoskeletal: Degenerative change in the lower lumbar spine. Left hip arthroplasty. 1There are no acute or suspicious osseous abnormalities. Review of the MIP images confirms the above findings. IMPRESSION: 1. No aortic dissection or pulmonary embolus. Stable ascending thoracic aortic aneurysm at 4 cm without acute aortic abnormality. Moderate atherosclerosis. 2. Ectatic abdominal aorta at risk for aneurysm development. Recommend followup by ultrasound in 5 years. This recommendation follows ACR consensus guidelines: White Paper of the ACR Incidental Findings Committee II on Vascular Findings. J Am Coll Radiol 2013; 10:789-794. Aortic aneurysm NOS (ICD10-I71.9) 3. Motion artifact through the proximal pancreas, equivocal wall peripancreatic edema versus motion. Recommend correlation with pancreatic enzymes. 4. Colonic diverticulosis without diverticulitis. 5. Generalized subcutaneous edema  suggesting fluid overload. Aortic Atherosclerosis (ICD10-I70.0) and Emphysema (ICD10-J43.9). Electronically Signed   By: Keith Rake M.D.   On: 01/03/2020 00:13     Subjective: Patient was seen during dialysis.  No acute concern.  Continues to have some cough.  Remained afebrile and saturating well on room air.  Discharge Exam: Vitals:   01/06/20 1345 01/06/20 1400  BP:  (!) 152/96  Pulse: 83 84  Resp: 18 16  Temp:    SpO2:     Vitals:   01/06/20 1330 01/06/20 1338 01/06/20 1345 01/06/20 1400  BP: (!) 157/105   (!) 152/96  Pulse: 88  83 84  Resp: 19  18 16   Temp:      TempSrc:      SpO2:  96%    Weight:      Height:        General: Pt is alert, awake, not in acute distress Cardiovascular: Irregularly irregular,, no rubs, no gallops Respiratory: CTA bilaterally, no wheezing, no rhonchi Abdominal: Soft, NT, ND, bowel sounds + Extremities:Trace LE edema, no cyanosis   The results of significant diagnostics from this hospitalization (including imaging, microbiology, ancillary and laboratory) are listed below for reference.    Microbiology: Recent Results (from the past 240 hour(s))  SARS Coronavirus 2 by RT PCR (hospital order, performed in [facility] hospital lab) Nasopharyngeal Nasopharyngeal Swab     Status: None   Collection Time: 01/02/20 10:52 PM  Specimen: Nasopharyngeal Swab  Result Value Ref Range Status   SARS Coronavirus 2 NEGATIVE NEGATIVE Final    Comment: (NOTE) SARS-CoV-2 target nucleic acids are NOT DETECTED.  The SARS-CoV-2 RNA is generally detectable in upper and lower respiratory specimens during the acute phase of infection. The lowest concentration of SARS-CoV-2 viral copies this assay can detect is 250 copies / mL. A negative result does not preclude SARS-CoV-2 infection and should not be used as the sole basis for treatment or other patient management decisions.  A negative result may occur with improper specimen collection / handling,  submission of specimen other than nasopharyngeal swab, presence of viral mutation(s) within the areas targeted by this assay, and inadequate number of viral copies (<250 copies / mL). A negative result must be combined with clinical observations, patient history, and epidemiological information.  Fact Sheet for Patients:   StrictlyIdeas.no  Fact Sheet for Healthcare Providers: BankingDealers.co.za  This test is not yet approved or  cleared by the [place] FDA and has been authorized for detection and/or diagnosis of SARS-CoV-2 by FDA under an Emergency Use Authorization (EUA).  This EUA will remain in effect (meaning this test can be used) for the duration of the COVID-19 declaration under Section 564(b)(1) of the Act, 21 U.S.C. section 360bbb-3(b)(1), unless the authorization is terminated or revoked sooner.  Performed at [facility], [address] [place], [place] [address]   SARS Coronavirus 2 by RT PCR (hospital order, performed in [facility] hospital lab) Nasopharyngeal Nasopharyngeal Swab     Status: None   Collection Time: 01/05/20  6:50 AM   Specimen: Nasopharyngeal Swab  Result Value Ref Range Status   SARS Coronavirus 2 NEGATIVE NEGATIVE Final    Comment: (NOTE) SARS-CoV-2 target nucleic acids are NOT DETECTED.  The SARS-CoV-2 RNA is generally detectable in upper and lower respiratory specimens during the acute phase of infection. The lowest concentration of SARS-CoV-2 viral copies this assay can detect is 250 copies / mL. A negative result does not preclude SARS-CoV-2 infection and should not be used as the sole basis for treatment or other patient management decisions.  A negative result may occur with improper specimen collection / handling, submission of specimen other than nasopharyngeal swab, presence of viral mutation(s) within the areas targeted by this assay, and inadequate number of viral  copies (<250 copies / mL). A negative result must be combined with clinical observations, patient history, and epidemiological information.  Fact Sheet for Patients:   StrictlyIdeas.no  Fact Sheet for Healthcare Providers: BankingDealers.co.za  This test is not yet approved or  cleared by the [place] FDA and has been authorized for detection and/or diagnosis of SARS-CoV-2 by FDA under an Emergency Use Authorization (EUA).  This EUA will remain in effect (meaning this test can be used) for the duration of the COVID-19 declaration under Section 564(b)(1) of the Act, 21 U.S.C. section 360bbb-3(b)(1), unless the authorization is terminated or revoked sooner.  Performed at [facility], [address] [place], [place] [address]      Labs: BNP (last 3 results) Recent Labs    07/04/19 0846 09/27/19 2150 01/05/20 0143  BNP 4,024.0* >4,500.0* >0,973.5*   Basic Metabolic Panel: Recent Labs  Lab 01/02/20 1610 01/05/20 0143  NA 138 138  K 4.0 4.2  CL 100 99  CO2 27 28  GLUCOSE 98 118*  BUN 30* 40*  CREATININE 6.97* 6.33*  CALCIUM 8.8* 8.4*   Liver Function Tests: Recent Labs  Lab 01/02/20  1757  AST 16  ALT 16  ALKPHOS 48  BILITOT 1.1  PROT 6.6  ALBUMIN 3.7   Recent Labs  Lab 01/02/20 1757  LIPASE 19   No results for input(s): AMMONIA in the last 168 hours. CBC: Recent Labs  Lab 01/02/20 1610 01/05/20 0143  WBC 3.2* 5.7  HGB 10.7* 9.9*  HCT 32.3* 29.0*  MCV 89.5 87.3  PLT 81* 90*   Cardiac Enzymes: No results for input(s): CKTOTAL, CKMB, CKMBINDEX, TROPONINI in the last 168 hours. BNP: Invalid input(s): POCBNP CBG: No results for input(s): GLUCAP in the last 168 hours. D-Dimer No results for input(s): DDIMER in the last 72 hours. Hgb A1c No results for input(s): HGBA1C in the last 72 hours. Lipid Profile No results for input(s): CHOL, HDL, LDLCALC, TRIG, CHOLHDL, LDLDIRECT in the  last 72 hours. Thyroid function studies No results for input(s): TSH, T4TOTAL, T3FREE, THYROIDAB in the last 72 hours.  Invalid input(s): FREET3 Anemia work up No results for input(s): VITAMINB12, FOLATE, FERRITIN, TIBC, IRON, RETICCTPCT in the last 72 hours. Urinalysis    Component Value Date/Time   COLORURINE YELLOW (A) 08/20/2018 0230   APPEARANCEUR CLOUDY (A) 08/20/2018 0230   APPEARANCEUR Clear 11/08/2014 1559   LABSPEC 1.010 08/20/2018 0230   LABSPEC 1.010 11/08/2014 1559   PHURINE 9.0 (H) 08/20/2018 0230   GLUCOSEU NEGATIVE 08/20/2018 0230   GLUCOSEU Negative 11/08/2014 1559   HGBUR NEGATIVE 08/20/2018 0230   BILIRUBINUR NEGATIVE 08/20/2018 0230   BILIRUBINUR Negative 11/08/2014 1559   KETONESUR NEGATIVE 08/20/2018 0230   PROTEINUR 100 (A) 08/20/2018 0230   UROBILINOGEN 1.0 10/28/2010 1100   NITRITE POSITIVE (A) 08/20/2018 0230   LEUKOCYTESUR LARGE (A) 08/20/2018 0230   LEUKOCYTESUR Negative 11/08/2014 1559   Sepsis Labs Invalid input(s): PROCALCITONIN,  WBC,  LACTICIDVEN Microbiology Recent Results (from the past 240 hour(s))  SARS Coronavirus 2 by RT PCR (hospital order, performed in [place] hospital lab) Nasopharyngeal Nasopharyngeal Swab     Status: None   Collection Time: 01/02/20 10:52 PM   Specimen: Nasopharyngeal Swab  Result Value Ref Range Status   SARS Coronavirus 2 NEGATIVE NEGATIVE Final    Comment: (NOTE) SARS-CoV-2 target nucleic acids are NOT DETECTED.  The SARS-CoV-2 RNA is generally detectable in upper and lower respiratory specimens during the acute phase of infection. The lowest concentration of SARS-CoV-2 viral copies this assay can detect is 250 copies / mL. A negative result does not preclude SARS-CoV-2 infection and should not be used as the sole basis for treatment or other patient management decisions.  A negative result may occur with improper specimen collection / handling, submission of specimen other than nasopharyngeal swab,  presence of viral mutation(s) within the areas targeted by this assay, and inadequate number of viral copies (<250 copies / mL). A negative result must be combined with clinical observations, patient history, and epidemiological information.  Fact Sheet for Patients:   StrictlyIdeas.no  Fact Sheet for Healthcare Providers: BankingDealers.co.za  This test is not yet approved or  cleared by the [place] FDA and has been authorized for detection and/or diagnosis of SARS-CoV-2 by FDA under an Emergency Use Authorization (EUA).  This EUA will remain in effect (meaning this test can be used) for the duration of the COVID-19 declaration under Section 564(b)(1) of the Act, 21 U.S.C. section 360bbb-3(b)(1), unless the authorization is terminated or revoked sooner.  Performed at [facility], [address], [place], [place] [address]   SARS Coronavirus 2 by RT PCR (hospital  order, performed in [facility] hospital lab) Nasopharyngeal Nasopharyngeal Swab     Status: None   Collection Time: 01/05/20  6:50 AM   Specimen: Nasopharyngeal Swab  Result Value Ref Range Status   SARS Coronavirus 2 NEGATIVE NEGATIVE Final    Comment: (NOTE) SARS-CoV-2 target nucleic acids are NOT DETECTED.  The SARS-CoV-2 RNA is generally detectable in upper and lower respiratory specimens during the acute phase of infection. The lowest concentration of SARS-CoV-2 viral copies this assay can detect is 250 copies / mL. A negative result does not preclude SARS-CoV-2 infection and should not be used as the sole basis for treatment or other patient management decisions.  A negative result may occur with improper specimen collection / handling, submission of specimen other than nasopharyngeal swab, presence of viral mutation(s) within the areas targeted by this assay, and inadequate number of viral copies (<250 copies / mL). A negative result must be  combined with clinical observations, patient history, and epidemiological information.  Fact Sheet for Patients:   StrictlyIdeas.no  Fact Sheet for Healthcare Providers: BankingDealers.co.za  This test is not yet approved or  cleared by the [place] FDA and has been authorized for detection and/or diagnosis of SARS-CoV-2 by FDA under an Emergency Use Authorization (EUA).  This EUA will remain in effect (meaning this test can be used) for the duration of the COVID-19 declaration under Section 564(b)(1) of the Act, 21 U.S.C. section 360bbb-3(b)(1), unless the authorization is terminated or revoked sooner.  Performed at [facility], [address] [place], [place] [address]     Time coordinating discharge: Over 30 minutes  SIGNED:  Lorella Nimrod, MD  [facility] 01/06/2020, 2:13 PM  If 7PM-7AM, please contact night-coverage [email or web address]  This record has been created using Systems analyst. Errors have been sought and corrected,but may not always be located. Such creation errors do not reflect on the standard of care.

## 2020-01-06 NOTE — Progress Notes (Signed)
Hemodialysis RN called given report. Patient off unit to hemodialysis at this time

## 2020-01-06 NOTE — Progress Notes (Signed)
PRE HD   

## 2020-01-06 NOTE — Progress Notes (Signed)
[facility]  ROUNDING NOTE   Subjective:   Seen and examined on hemodialysis treatment on hemodialysis.    HEMODIALYSIS FLOWSHEET:  Blood Flow Rate (mL/min): 400 mL/min Arterial Pressure (mmHg): -180 mmHg Venous Pressure (mmHg): 180 mmHg Transmembrane Pressure (mmHg): 80 mmHg Ultrafiltration Rate (mL/min): 860 mL/min Dialysate Flow Rate (mL/min): 800 ml/min Conductivity: Machine : 14 Conductivity: Machine : 14 Dialysis Fluid Bolus: Normal Saline Bolus Amount (mL): 250 mL    Objective:  Vital signs in last 24 hours:  Temp:  [97.4 F (36.3 C)-98.7 F (37.1 C)] 97.7 F (36.5 C) (06/28 1506) Pulse Rate:  [43-93] 77 (06/28 1506) Resp:  [16-29] 16 (06/28 1400) BP: (138-170)/(70-109) 148/97 (06/28 1506) SpO2:  [96 %-100 %] 97 % (06/28 1506) Weight:  [108 kg] 108 kg (06/28 1010)  Weight change:  Filed Weights   01/05/20 0124 01/06/20 1010  Weight: 108.9 kg 108 kg    Intake/Output: I/O last 3 completed shifts: In: 780.1 [P.O.:680; IV Piggyback:100.1] Out: -    Intake/Output this shift:  Total I/O In: 240 [P.O.:240] Out: 2500 [Other:2500]  Physical Exam: General: NAD, laying in bed  Head: Normocephalic, atraumatic. Moist oral mucosal membranes  Eyes: Anicteric, PERRL  Neck: Supple, trachea midline  Lungs:  +wheezing  Heart: Regular rate and rhythm  Abdomen:  Soft, nontender,   Extremities: no peripheral edema.  Neurologic: Nonfocal, moving all four extremities  Skin: No lesions  Access: Left AVF    Basic Metabolic Panel: Recent Labs  Lab 01/02/20 1610 01/05/20 0143  NA 138 138  K 4.0 4.2  CL 100 99  CO2 27 28  GLUCOSE 98 118*  BUN 30* 40*  CREATININE 6.97* 6.33*  CALCIUM 8.8* 8.4*    Liver Function Tests: Recent Labs  Lab 01/02/20 1757  AST 16  ALT 16  ALKPHOS 48  BILITOT 1.1  PROT 6.6  ALBUMIN 3.7   Recent Labs  Lab 01/02/20 1757  LIPASE 19   No results for input(s): AMMONIA in the last 168 hours.  CBC: Recent Labs   Lab 01/02/20 1610 01/05/20 0143  WBC 3.2* 5.7  HGB 10.7* 9.9*  HCT 32.3* 29.0*  MCV 89.5 87.3  PLT 81* 90*    Cardiac Enzymes: No results for input(s): CKTOTAL, CKMB, CKMBINDEX, TROPONINI in the last 168 hours.  BNP: Invalid input(s): POCBNP  CBG: No results for input(s): GLUCAP in the last 168 hours.  Microbiology: Results for orders placed or performed during the hospital encounter of 01/05/20  SARS Coronavirus 2 by RT PCR (hospital order, performed in [facility] hospital lab) Nasopharyngeal Nasopharyngeal Swab     Status: None   Collection Time: 01/05/20  6:50 AM   Specimen: Nasopharyngeal Swab  Result Value Ref Range Status   SARS Coronavirus 2 NEGATIVE NEGATIVE Final    Comment: (NOTE) SARS-CoV-2 target nucleic acids are NOT DETECTED.  The SARS-CoV-2 RNA is generally detectable in upper and lower respiratory specimens during the acute phase of infection. The lowest concentration of SARS-CoV-2 viral copies this assay can detect is 250 copies / mL. A negative result does not preclude SARS-CoV-2 infection and should not be used as the sole basis for treatment or other patient management decisions.  A negative result may occur with improper specimen collection / handling, submission of specimen other than nasopharyngeal swab, presence of viral mutation(s) within the areas targeted by this assay, and inadequate number of viral copies (<250 copies / mL). A negative result must be combined with clinical observations, patient history, and  epidemiological information.  Fact Sheet for Patients:   StrictlyIdeas.no  Fact Sheet for Healthcare Providers: BankingDealers.co.za  This test is not yet approved or  cleared by the [place] FDA and has been authorized for detection and/or diagnosis of SARS-CoV-2 by FDA under an Emergency Use Authorization (EUA).  This EUA will remain in effect (meaning this test can be used) for  the duration of the COVID-19 declaration under Section 564(b)(1) of the Act, 21 U.S.C. section 360bbb-3(b)(1), unless the authorization is terminated or revoked sooner.  Performed at [facility], [address] [place], [place] [address]     Coagulation Studies: No results for input(s): LABPROT, INR in the last 72 hours.  Urinalysis: No results for input(s): COLORURINE, LABSPEC, PHURINE, GLUCOSEU, HGBUR, BILIRUBINUR, KETONESUR, PROTEINUR, UROBILINOGEN, NITRITE, LEUKOCYTESUR in the last 72 hours.  Invalid input(s): APPERANCEUR    Imaging: DG Chest 2 View  Result Date: 01/05/2020 CLINICAL DATA:  Chest pain, short of breath, nausea and vomiting EXAM: CHEST - 2 VIEW COMPARISON:  01/02/2020 FINDINGS: Frontal and lateral views of the chest demonstrates stable enlargement of the cardiac silhouette. Thoracic aortic ectasia is unchanged. Chronic central vascular congestion without airspace disease, effusion, or pneumothorax. No acute bony abnormalities. IMPRESSION: 1. Chronic vascular congestion and cardiomegaly.  No acute process. Electronically Signed   By: Randa Ngo M.D.   On: 01/05/2020 02:12     Medications:   . sodium chloride    . cefTRIAXone (ROCEPHIN)  IV Stopped (01/05/20 1024)   . amLODipine  10 mg Oral Daily  . apixaban  2.5 mg Oral BID  . atorvastatin  40 mg Oral QHS  . Chlorhexidine Gluconate Cloth  6 each Topical Q0600  . epoetin (EPOGEN/PROCRIT) injection  3,000 Units Subcutaneous Q M,W,F-HD  . ferric citrate  210 mg Oral TID with meals  . hydrALAZINE  100 mg Oral TID  . irbesartan  75 mg Oral QHS  . isosorbide mononitrate  60 mg Oral Daily  . oxyCODONE  20 mg Oral Q12H  . pantoprazole  40 mg Oral Daily  . predniSONE  40 mg Oral Daily  . sodium chloride flush  3 mL Intravenous Q12H   sodium chloride, chlorpheniramine-HYDROcodone, ipratropium-albuterol, oxyCODONE, sodium chloride flush  Assessment/ Plan:  Mr. ZIAH LEANDRO is a 64 y.o. black  male with end-stage renal disease, previous history of substance abuse, history of pulmonary embolism, depression, three-vessel coronary disease-seen on CT, hepatitis C, emphysema, aortic atherosclerosis admitted to [facility] on 01/05/2020 for Acute bronchitis [J20.9] Cough [R05] SOB (shortness of breath) [R06.02] ESRD (end stage renal disease) on dialysis ([place]) [N18.6, Z99.2] Nonspecific chest pain [R07.9] Acute on chronic congestive heart failure, unspecified heart failure type ([place]) [I50.9]   [facility] MWF [place] Left AVF  1. End Stage renal disease: seen and examined on hemodialysis.   2. Hypertension - current regimen of amlodipine, hydralazine, irbesartan, isosorbide mononitrate  3. Anemia of chronic kidney disease: hemoglobin 9.9 - EPO with HD treatment  4. Secondary Hyperparathyroidism:  - Auryxia with meals   LOS: 0 [name] 6/28/20213:46 PM

## 2020-01-06 NOTE — Progress Notes (Signed)
HD ended 

## 2020-01-16 ENCOUNTER — Encounter: Payer: Self-pay | Admitting: Emergency Medicine

## 2020-01-16 ENCOUNTER — Emergency Department
Admission: EM | Admit: 2020-01-16 | Discharge: 2020-01-16 | Disposition: A | Discharge status: Home / Self Care | Payer: Medicare Other | Attending: Emergency Medicine | Admitting: Emergency Medicine

## 2020-01-16 ENCOUNTER — Emergency Department: Payer: Medicare Other

## 2020-01-16 ENCOUNTER — Other Ambulatory Visit: Payer: Self-pay

## 2020-01-16 DIAGNOSIS — I509 Heart failure, unspecified: Secondary | ICD-10-CM | POA: Diagnosis not present

## 2020-01-16 DIAGNOSIS — I132 Hypertensive heart and chronic kidney disease with heart failure and with stage 5 chronic kidney disease, or end stage renal disease: Secondary | ICD-10-CM | POA: Diagnosis not present

## 2020-01-16 DIAGNOSIS — Z7901 Long term (current) use of anticoagulants: Secondary | ICD-10-CM | POA: Insufficient documentation

## 2020-01-16 DIAGNOSIS — N185 Chronic kidney disease, stage 5: Secondary | ICD-10-CM | POA: Insufficient documentation

## 2020-01-16 DIAGNOSIS — F17210 Nicotine dependence, cigarettes, uncomplicated: Secondary | ICD-10-CM | POA: Diagnosis not present

## 2020-01-16 DIAGNOSIS — Z8546 Personal history of malignant neoplasm of prostate: Secondary | ICD-10-CM | POA: Diagnosis not present

## 2020-01-16 DIAGNOSIS — J42 Unspecified chronic bronchitis: Secondary | ICD-10-CM | POA: Diagnosis not present

## 2020-01-16 DIAGNOSIS — R079 Chest pain, unspecified: Secondary | ICD-10-CM | POA: Diagnosis present

## 2020-01-16 DIAGNOSIS — I2510 Atherosclerotic heart disease of native coronary artery without angina pectoris: Secondary | ICD-10-CM | POA: Insufficient documentation

## 2020-01-16 DIAGNOSIS — Z79899 Other long term (current) drug therapy: Secondary | ICD-10-CM | POA: Diagnosis not present

## 2020-01-16 LAB — BASIC METABOLIC PANEL WITH GFR
Anion gap: 12 (ref 5–15)
BUN: 30 mg/dL — ABNORMAL HIGH (ref 8–23)
CO2: 29 mmol/L (ref 22–32)
Calcium: 8.5 mg/dL — ABNORMAL LOW (ref 8.9–10.3)
Chloride: 98 mmol/L (ref 98–111)
Creatinine, Ser: 6.36 mg/dL — ABNORMAL HIGH (ref 0.61–1.24)
GFR calc Af Amer: 10 mL/min — ABNORMAL LOW (ref >60)
GFR calc non Af Amer: 9 mL/min — ABNORMAL LOW (ref >60)
Glucose, Bld: 97 mg/dL (ref 70–99)
Potassium: 3.8 mmol/L (ref 3.5–5.1)
Sodium: 139 mmol/L (ref 135–145)

## 2020-01-16 LAB — CBC
HCT: 30.3 % — ABNORMAL LOW (ref 39.0–52.0)
Hemoglobin: 10.1 g/dL — ABNORMAL LOW (ref 13.0–17.0)
MCH: 29.6 pg (ref 26.0–34.0)
MCHC: 33.3 g/dL (ref 30.0–36.0)
MCV: 88.9 fL (ref 80.0–100.0)
Platelets: 106 K/uL — ABNORMAL LOW (ref 150–400)
RBC: 3.41 MIL/uL — ABNORMAL LOW (ref 4.22–5.81)
RDW: 15.4 % (ref 11.5–15.5)
WBC: 4.8 K/uL (ref 4.0–10.5)
nRBC: 0 % (ref 0.0–0.2)

## 2020-01-16 LAB — TROPONIN I (HIGH SENSITIVITY)
Troponin I (High Sensitivity): 61 ng/L — ABNORMAL HIGH (ref <18)
Troponin I (High Sensitivity): 63 ng/L — ABNORMAL HIGH (ref <18)

## 2020-01-16 MED ORDER — PREDNISONE 20 MG PO TABS: 60.0000 mg | ORAL_TABLET | Freq: Every day | ORAL | 0 refills | Status: AC

## 2020-01-16 MED ORDER — IPRATROPIUM-ALBUTEROL 0.5-2.5 (3) MG/3ML IN SOLN
9.0000 mL | Freq: Once | RESPIRATORY_TRACT | Status: AC
  Administered 2020-01-16: 9 mL via RESPIRATORY_TRACT
  Filled 2020-01-16: qty 3

## 2020-01-16 MED ORDER — AZITHROMYCIN 250 MG PO TABS: ORAL_TABLET | ORAL | 0 refills | Status: AC

## 2020-01-16 MED ORDER — SODIUM CHLORIDE 0.9% FLUSH: 3.0000 mL | Freq: Once | INTRAVENOUS | Status: DC

## 2020-01-16 MED ORDER — PREDNISONE 20 MG PO TABS
60.0000 mg | ORAL_TABLET | Freq: Once | ORAL | Status: AC
  Administered 2020-01-16: 60 mg via ORAL
  Filled 2020-01-16: qty 3

## 2020-01-16 MED ORDER — ALBUTEROL SULFATE HFA 108 (90 BASE) MCG/ACT IN AERS
2.0000 | INHALATION_SPRAY | Freq: Four times a day (QID) | RESPIRATORY_TRACT | 0 refills | Status: AC | PRN
Start: 2020-01-16 — End: ?

## 2020-01-16 MED ORDER — OXYCODONE HCL 5 MG PO TABS
5.0000 mg | ORAL_TABLET | Freq: Once | ORAL | Status: AC
  Administered 2020-01-16: 5 mg via ORAL
  Filled 2020-01-16: qty 1

## 2020-01-16 NOTE — ED Notes (Addendum)
Pt decided to leave without d/c instructions or d/c vitals. Ambulatory, NAD noted

## 2020-01-16 NOTE — ED Triage Notes (Signed)
C/O mid chest pain x 1 day and cough x 2 weeks

## 2020-01-16 NOTE — ED Notes (Signed)
See triage note, pt c/o sharp mid chest pain to central chest and SHOB that started yesterday along with productive cough for 2-3 weeks. Reports taking oxycodone for pain yesterday with no relief. Denies N/V. Pt reports last got dialysis on yesterday.  Alert and oriented.  Dr Charna Archer at bedside to assess

## 2020-01-16 NOTE — ED Provider Notes (Signed)
[facility] Emergency Department Provider Note   ____________________________________________   First MD Initiated Contact with Patient 01/16/20 1429     (approximate)  I have reviewed the triage vital signs and the nursing notes.   HISTORY  Chief Complaint Chest Pain    HPI DIAR [name] is a 64 y.o. male with past medical history of ESRD on HD (MWF), CHF, hypertension, CAD, and PE on Eliquis who presents to the ED complaining of chest pain.  Patient reports constant pain in the center of his chest starting overnight.  He describes it as a sharp and stabbing pain that is not exacerbated or alleviated by anything.  It is associated with a cough productive of greenish sputum as well as some mild difficulty breathing, but he denies any fevers.  He has not noticed any swelling in his legs and states he got a full run of dialysis yesterday without issues.  He was treated for similar symptoms last month with a course of steroids and antibiotics, states he has not been using his inhaler regularly at home since then.        Past Medical History:  Diagnosis Date  . Depression   . Diastolic dysfunction    a. 09/2019 Echo: EF 55-60%, no rwma, mod LVH, gr2 DD. Nl RV size/fxn. Mildly dil LA. Ao root 4.5cm.  . Dilated aortic root ([place])    a. 09/2019 Echo: Ao root 4.5cm.  . Elevated troponin level not due myocardial infarction   . ESRD (end stage renal disease) ([place])   . Hypertension   . Nonobstructive Coronary Artery Disease    a. 09/2019 Cath: LM nl, LAD min irregs, D1 nl, D2 min irregs, LCX large, min irregs, RCA large, 20p, 30p/m.  . PE (pulmonary embolism)   . Renal insufficiency     Patient Active Problem List   Diagnosis Date Noted  . Cough   . CAD (coronary artery disease) 01/05/2020  . Chronic anticoagulation 01/05/2020  . Acute bronchitis 01/05/2020  . AF (paroxysmal atrial fibrillation) ([place]) 01/05/2020  . History of substance abuse ([place])  10/24/2019  . Elevated troponin 10/24/2019  . Nonspecific chest pain 10/24/2019  . Chronic diastolic CHF (congestive heart failure) ([place]) 09/28/2019  . History of MI (myocardial infarction) 09/28/2019  . Anemia of chronic kidney failure, stage 5 ([place]) 09/28/2019  . History of pulmonary embolism 09/28/2019  . Chronic midline low back pain   . Acute respiratory failure with hypoxia ([place])   . Acute diastolic CHF (congestive heart failure) ([place])   . Anemia of chronic disease   . Thrombocytopenia ([place])   . Congestive heart failure ([place])   . Fluid overload 06/21/2019  . Volume overload 11/15/2018  . ESRD (end stage renal disease) on dialysis ([place]) 11/14/2018  . HCAP (healthcare-associated pneumonia) 08/27/2018  . SOB (shortness of breath) 08/25/2018  . Chest pain of uncertain etiology 81/27/5170  . ESRD on dialysis ([place]) 11/08/2016  . Prostate cancer screening 11/08/2016  . Acquired cyst of kidney 11/08/2016  . Urinary urgency 11/08/2016  . Acute on chronic renal failure ([place]) 09/23/2016  . Hypertensive urgency 09/21/2016  . Dysthymia 02/26/2016  . Chronic pain 02/26/2016  . Noncompliance with renal dialysis ([place]) 02/26/2016  . Opiate abuse, continuous ([place]) 02/03/2016  . Substance induced mood disorder ([place]) 02/03/2016  . Antisocial personality disorder ([place]) 02/03/2016  . Left-sided weakness 01/18/2016  . HEPATITIS C 06/28/2008  . HLD (hyperlipidemia) 06/28/2008  . SUBSTANCE ABUSE, MULTIPLE 06/28/2008  . Hypertension 06/27/2008  Past Surgical History:  Procedure Laterality Date  . LEFT HEART CATH AND CORONARY ANGIOGRAPHY N/A 09/30/2019   Procedure: LEFT HEART CATH AND CORONARY ANGIOGRAPHY;  Surgeon: Wellington Hampshire, MD;  Location: [facility];  Service: Cardiovascular;  Laterality: N/A;  . TOTAL HIP ARTHROPLASTY      Prior to Admission medications   Medication Sig Start Date End Date Taking? Authorizing Provider  albuterol (VENTOLIN HFA) 108 (90 Base) MCG/ACT  inhaler Inhale 2 puffs into the lungs every 6 (six) hours as needed for wheezing or shortness of breath. 01/16/20   Blake Divine, MD  amLODipine (NORVASC) 10 MG tablet Take 1 tablet (10 mg total) by mouth daily. 06/23/19 06/22/20  Jennye Boroughs, MD  apixaban (ELIQUIS) 2.5 MG TABS tablet Take 1 tablet (2.5 mg total) by mouth 2 (two) times daily. 06/23/19   Jennye Boroughs, MD  atorvastatin (LIPITOR) 40 MG tablet Take 1 tablet (40 mg total) by mouth at bedtime. 10/24/19 11/23/19  Max Sane, MD  AURYXIA 1 GM 210 MG(Fe) tablet Take 210 mg by mouth See admin instructions. Take 1 tablet (210mg ) by mouth three times daily with meals and take 1 tablet (210mg ) by mouth with snacks 06/28/19   [provider]  azithromycin (ZITHROMAX Z-PAK) 250 MG tablet Take 2 tablets (500 mg) on  Day 1,  followed by 1 tablet (250 mg) once daily on Days 2 through 5. 01/16/20 01/21/20  Blake Divine, MD  dextromethorphan-guaiFENesin [facility] DM) 30-600 MG 12hr tablet Take 1 tablet by mouth 2 (two) times daily as needed for cough. 01/06/20   Lorella Nimrod, MD  hydrALAZINE (APRESOLINE) 100 MG tablet Take 1 tablet (100 mg total) by mouth 3 (three) times daily. 06/23/19   Jennye Boroughs, MD  irbesartan (AVAPRO) 75 MG tablet Take 1 tablet (75 mg total) by mouth at bedtime. 10/24/19   Max Sane, MD  isosorbide mononitrate (IMDUR) 60 MG 24 hr tablet Take 1 tablet (60 mg total) by mouth daily. 10/24/19   Max Sane, MD  ondansetron (ZOFRAN) 4 MG tablet Take 1 tablet (4 mg total) by mouth every 6 (six) hours as needed for nausea. 10/24/19   Max Sane, MD  oxyCODONE (OXY IR/ROXICODONE) 5 MG immediate release tablet Take 5-10 mg by mouth every 6 (six) hours as needed for severe pain.    [provider]  oxyCODONE (OXYCONTIN) 20 mg 12 hr tablet Take 20 mg by mouth every 12 (twelve) hours.    [provider]  pantoprazole (PROTONIX) 40 MG tablet Take 1 tablet (40 mg total) by mouth daily. 10/24/19   Max Sane, MD    predniSONE (DELTASONE) 20 MG tablet Take 3 tablets (60 mg total) by mouth daily for 5 days. 01/16/20 01/21/20  Blake Divine, MD    Allergies Cyclobenzaprine, Clonidine, Furosemide, and Tylenol [acetaminophen]  No family history on file.  Social History Social History   Tobacco Use  . Smoking status: Current Every Day Smoker    Packs/day: 0.50    Types: Cigarettes  . Smokeless tobacco: Never Used  Substance Use Topics  . Alcohol use: No  . Drug use: No    Review of Systems  Constitutional: No fever/chills Eyes: No visual changes. ENT: No sore throat. Cardiovascular: Positive for chest pain. Respiratory: Positive for cough and shortness of breath. Gastrointestinal: No abdominal pain.  No nausea, no vomiting.  No diarrhea.  No constipation. Genitourinary: Negative for dysuria. Musculoskeletal: Negative for back pain. Skin: Negative for rash. Neurological: Negative for headaches, focal weakness  or numbness.  ____________________________________________   PHYSICAL EXAM:  VITAL SIGNS: ED Triage Vitals  Enc Vitals Group     BP 01/16/20 0948 (!) 170/108     Pulse Rate 01/16/20 0948 89     Resp 01/16/20 0948 18     Temp 01/16/20 0948 98.3 F (36.8 C)     Temp Source 01/16/20 0948 Oral     SpO2 01/16/20 0948 99 %     Weight 01/16/20 0949 238 lb 1.6 oz (108 kg)     Height --      Head Circumference --      Peak Flow --      Pain Score 01/16/20 0949 9     Pain Loc --      Pain Edu? --      Excl. in [place]? --     Constitutional: Alert and oriented. Eyes: Conjunctivae are normal. Head: Atraumatic. Nose: No congestion/rhinnorhea. Mouth/Throat: Mucous membranes are moist. Neck: Normal ROM Cardiovascular: Normal rate, regular rhythm. Grossly normal heart sounds.  Left upper extremity AV fistula with palpable thrill. Respiratory: Normal respiratory effort.  No retractions. Lungs with expiratory wheezing throughout. Gastrointestinal: Soft and nontender. No  distention. Genitourinary: deferred Musculoskeletal: No lower extremity tenderness nor edema. Neurologic:  Normal speech and language. No gross focal neurologic deficits are appreciated. Skin:  Skin is warm, dry and intact. No rash noted. Psychiatric: Mood and affect are normal. Speech and behavior are normal.  ____________________________________________   LABS (all labs ordered are listed, but only abnormal results are displayed)  Labs Reviewed  BASIC METABOLIC PANEL - Abnormal; Notable for the following components:      Result Value   BUN 30 (*)    Creatinine, Ser 6.36 (*)    Calcium 8.5 (*)    GFR calc non Af Amer 9 (*)    GFR calc Af Amer 10 (*)    All other components within normal limits  CBC - Abnormal; Notable for the following components:   RBC 3.41 (*)    Hemoglobin 10.1 (*)    HCT 30.3 (*)    Platelets 106 (*)    All other components within normal limits  TROPONIN I (HIGH SENSITIVITY) - Abnormal; Notable for the following components:   Troponin I (High Sensitivity) 61 (*)    All other components within normal limits  TROPONIN I (HIGH SENSITIVITY) - Abnormal; Notable for the following components:   Troponin I (High Sensitivity) 63 (*)    All other components within normal limits   ____________________________________________  EKG  ED ECG REPORT I, Blake Divine, the attending physician, personally viewed and interpreted this ECG.   Date: 01/16/2020  EKG Time: 9:42  Rate: 78  Rhythm: Atrial fibrillation  Axis: LAD  Intervals:none  ST&T Change: Lateral T wave inversions, similar to prior   PROCEDURES  Procedure(s) performed (including Critical Care):  Procedures   ____________________________________________   INITIAL IMPRESSION / ASSESSMENT AND PLAN / ED COURSE       64 year old male with past medical history of ESRD on HD (MWF), CHF, hypertension, CAD, and PE on Eliquis presents to the ED complaining of sharp central chest pain since last  night along with at least 1 week of productive cough and mild difficulty breathing.  He has expiratory wheezing on exam but is not in any respiratory distress, maintaining O2 sats on room air.  His symptoms sound most consistent with acute on chronic bronchitis and we will treat with DuoNeb's, steroids, and pain medication.  Ischemia  work-up thus far is unremarkable as EKG shows no acute ischemic changes and troponin is at patient's baseline.  We will trend troponin and if this remains stable, he would be appropriate for discharge home with PCP follow-up.  Chest x-ray shows no evidence of pneumonia and patient appears well with no evidence of hypervolemia, received a full run of dialysis yesterday.  Repeat troponin remained stable, patient reports feeling much better following breathing treatments and steroids, wheezing now much improved.  Patient is requesting to be discharged home, which is reasonable given reassuring work-up and symptomatic improvement.  Will prescribe course of steroids, albuterol, and azithromycin given acute on chronic bronchitis with purulent sputum production.  He was counseled to follow-up with his PCP and otherwise return to the ED for new worsening symptoms.  Patient agrees with plan.      ____________________________________________   FINAL CLINICAL IMPRESSION(S) / ED DIAGNOSES  Final diagnoses:  Chronic bronchitis, unspecified chronic bronchitis type ([place])  Nonspecific chest pain     ED Discharge Orders         Ordered    albuterol (VENTOLIN HFA) 108 (90 Base) MCG/ACT inhaler  Every 6 hours PRN     Discontinue  Reprint    Note to Pharmacy: Please supply with spacer   01/16/20 1543    predniSONE (DELTASONE) 20 MG tablet  Daily     Discontinue  Reprint     01/16/20 1543    azithromycin (ZITHROMAX Z-PAK) 250 MG tablet     Discontinue  Reprint     01/16/20 1543           Note:  This document was prepared using Dragon voice recognition software and may  include unintentional dictation errors.   Blake Divine, MD 01/16/20 [phone]

## 2020-09-13 ENCOUNTER — Emergency Department: Payer: Medicare Other

## 2020-09-13 ENCOUNTER — Other Ambulatory Visit: Payer: Self-pay

## 2020-09-13 ENCOUNTER — Emergency Department
Admission: EM | Admit: 2020-09-13 | Discharge: 2020-09-13 | Discharge status: Against Medical Advice | Payer: Medicare Other | Attending: Emergency Medicine | Admitting: Emergency Medicine

## 2020-09-13 DIAGNOSIS — Z96649 Presence of unspecified artificial hip joint: Secondary | ICD-10-CM | POA: Diagnosis not present

## 2020-09-13 DIAGNOSIS — Z7901 Long term (current) use of anticoagulants: Secondary | ICD-10-CM | POA: Diagnosis not present

## 2020-09-13 DIAGNOSIS — N186 End stage renal disease: Secondary | ICD-10-CM | POA: Diagnosis not present

## 2020-09-13 DIAGNOSIS — I2510 Atherosclerotic heart disease of native coronary artery without angina pectoris: Secondary | ICD-10-CM | POA: Insufficient documentation

## 2020-09-13 DIAGNOSIS — R079 Chest pain, unspecified: Secondary | ICD-10-CM

## 2020-09-13 DIAGNOSIS — D631 Anemia in chronic kidney disease: Secondary | ICD-10-CM | POA: Diagnosis not present

## 2020-09-13 DIAGNOSIS — I5033 Acute on chronic diastolic (congestive) heart failure: Secondary | ICD-10-CM | POA: Insufficient documentation

## 2020-09-13 DIAGNOSIS — I480 Paroxysmal atrial fibrillation: Secondary | ICD-10-CM | POA: Insufficient documentation

## 2020-09-13 DIAGNOSIS — I132 Hypertensive heart and chronic kidney disease with heart failure and with stage 5 chronic kidney disease, or end stage renal disease: Secondary | ICD-10-CM | POA: Insufficient documentation

## 2020-09-13 DIAGNOSIS — R072 Precordial pain: Secondary | ICD-10-CM | POA: Diagnosis present

## 2020-09-13 DIAGNOSIS — F17210 Nicotine dependence, cigarettes, uncomplicated: Secondary | ICD-10-CM | POA: Diagnosis not present

## 2020-09-13 DIAGNOSIS — Z79899 Other long term (current) drug therapy: Secondary | ICD-10-CM | POA: Diagnosis not present

## 2020-09-13 DIAGNOSIS — Z992 Dependence on renal dialysis: Secondary | ICD-10-CM | POA: Diagnosis not present

## 2020-09-13 LAB — CBC
HCT: 26.6 % — ABNORMAL LOW (ref 39.0–52.0)
Hemoglobin: 8.7 g/dL — ABNORMAL LOW (ref 13.0–17.0)
MCH: 29.2 pg (ref 26.0–34.0)
MCHC: 32.7 g/dL (ref 30.0–36.0)
MCV: 89.3 fL (ref 80.0–100.0)
Platelets: 89 10*3/uL — ABNORMAL LOW (ref 150–400)
RBC: 2.98 MIL/uL — ABNORMAL LOW (ref 4.22–5.81)
RDW: 15.9 % — ABNORMAL HIGH (ref 11.5–15.5)
WBC: 4.9 10*3/uL (ref 4.0–10.5)
nRBC: 0 % (ref 0.0–0.2)

## 2020-09-13 LAB — BASIC METABOLIC PANEL
Anion gap: 16 — ABNORMAL HIGH (ref 5–15)
BUN: 64 mg/dL — ABNORMAL HIGH (ref 8–23)
CO2: 21 mmol/L — ABNORMAL LOW (ref 22–32)
Calcium: 8.7 mg/dL — ABNORMAL LOW (ref 8.9–10.3)
Chloride: 101 mmol/L (ref 98–111)
Creatinine, Ser: 10.27 mg/dL — ABNORMAL HIGH (ref 0.61–1.24)
GFR, Estimated: 5 mL/min — ABNORMAL LOW (ref >60)
Glucose, Bld: 105 mg/dL — ABNORMAL HIGH (ref 70–99)
Potassium: 4.8 mmol/L (ref 3.5–5.1)
Sodium: 138 mmol/L (ref 135–145)

## 2020-09-13 LAB — TROPONIN I (HIGH SENSITIVITY)
Troponin I (High Sensitivity): 98 ng/L — ABNORMAL HIGH (ref <18)
Troponin I (High Sensitivity): 95 ng/L — ABNORMAL HIGH (ref <18)

## 2020-09-13 MED ORDER — KETOROLAC TROMETHAMINE 30 MG/ML IJ SOLN
30.0000 mg | Freq: Once | INTRAMUSCULAR | Status: AC
  Administered 2020-09-13: 30 mg via INTRAVENOUS
  Filled 2020-09-13: qty 1

## 2020-09-13 NOTE — ED Provider Notes (Addendum)
[facility] Emergency Department Provider Note   ____________________________________________   Event Date/Time   First MD Initiated Contact with Patient 09/13/20 1132     (approximate)  I have reviewed the triage vital signs and the nursing notes.   HISTORY  Chief Complaint Chest Pain    HPI KINSER Hodge is a 65 y.o. male with a past medical history of psychiatric disease, end-stage renal disease on dialysis, hypertension, and coronary artery disease who presents for sharp stabbing substernal chest pain that does not radiate and has been worsening for the last 2 days.  Patient describes 10/10 pain that has no exacerbating or relieving factors.  Patient currently denies any vision changes, tinnitus, difficulty speaking, facial droop, sore throat, abdominal pain, nausea/vomiting/diarrhea, dysuria, or weakness/numbness/paresthesias in any extremity         Past Medical History:  Diagnosis Date  . Depression   . Diastolic dysfunction    a. 09/2019 Echo: EF 55-60%, no rwma, mod LVH, gr2 DD. Nl RV size/fxn. Mildly dil LA. Ao root 4.5cm.  . Dilated aortic root ([place])    a. 09/2019 Echo: Ao root 4.5cm.  . Elevated troponin level not due myocardial infarction   . ESRD (end stage renal disease) ([place])   . Hypertension   . Nonobstructive Coronary Artery Disease    a. 09/2019 Cath: LM nl, LAD min irregs, D1 nl, D2 min irregs, LCX large, min irregs, RCA large, 20p, 30p/m.  . PE (pulmonary embolism)   . Renal insufficiency     Patient Active Problem List   Diagnosis Date Noted  . Cough   . CAD (coronary artery disease) 01/05/2020  . Chronic anticoagulation 01/05/2020  . Acute bronchitis 01/05/2020  . AF (paroxysmal atrial fibrillation) ([place]) 01/05/2020  . History of substance abuse ([place]) 10/24/2019  . Elevated troponin 10/24/2019  . Nonspecific chest pain 10/24/2019  . Chronic diastolic CHF (congestive heart failure) ([place]) 09/28/2019  . History of MI  (myocardial infarction) 09/28/2019  . Anemia of chronic kidney failure, stage 5 ([place]) 09/28/2019  . History of pulmonary embolism 09/28/2019  . Chronic midline low back pain   . Acute respiratory failure with hypoxia ([place])   . Acute diastolic CHF (congestive heart failure) ([place])   . Anemia of chronic disease   . Thrombocytopenia ([place])   . Congestive heart failure ([place])   . Fluid overload 06/21/2019  . Volume overload 11/15/2018  . ESRD (end stage renal disease) on dialysis ([place]) 11/14/2018  . HCAP (healthcare-associated pneumonia) 08/27/2018  . SOB (shortness of breath) 08/25/2018  . Chest pain of uncertain etiology [id number]  . ESRD on dialysis ([place]) 11/08/2016  . Prostate cancer screening 11/08/2016  . Acquired cyst of kidney 11/08/2016  . Urinary urgency 11/08/2016  . Acute on chronic renal failure ([place]) 09/23/2016  . Hypertensive urgency 09/21/2016  . Dysthymia 02/26/2016  . Chronic pain 02/26/2016  . Noncompliance with renal dialysis ([place]) 02/26/2016  . Opiate abuse, continuous ([place]) 02/03/2016  . Substance induced mood disorder ([place]) 02/03/2016  . Antisocial personality disorder ([place]) 02/03/2016  . Left-sided weakness 01/18/2016  . HEPATITIS C 06/28/2008  . HLD (hyperlipidemia) 06/28/2008  . SUBSTANCE ABUSE, MULTIPLE 06/28/2008  . Hypertension 06/27/2008    Past Surgical History:  Procedure Laterality Date  . LEFT HEART CATH AND CORONARY ANGIOGRAPHY N/A 09/30/2019   Procedure: LEFT HEART CATH AND CORONARY ANGIOGRAPHY;  Surgeon: Wellington Hampshire, MD;  Location: [facility];  Service: Cardiovascular;  Laterality: N/A;  . TOTAL HIP  ARTHROPLASTY      Prior to Admission medications   Medication Sig Start Date End Date Taking? Authorizing Provider  albuterol (VENTOLIN HFA) 108 (90 Base) MCG/ACT inhaler Inhale 2 puffs into the lungs every 6 (six) hours as needed for wheezing or shortness of breath. 01/16/20   Blake Divine, MD  amLODipine (NORVASC) 10 MG tablet  Take 1 tablet (10 mg total) by mouth daily. 06/23/19 06/22/20  Jennye Boroughs, MD  apixaban (ELIQUIS) 2.5 MG TABS tablet Take 1 tablet (2.5 mg total) by mouth 2 (two) times daily. 06/23/19   Jennye Boroughs, MD  atorvastatin (LIPITOR) 40 MG tablet Take 1 tablet (40 mg total) by mouth at bedtime. 10/24/19 11/23/19  Max Sane, MD  AURYXIA 1 GM 210 MG(Fe) tablet Take 210 mg by mouth See admin instructions. Take 1 tablet ('210mg'$ ) by mouth three times daily with meals and take 1 tablet ('210mg'$ ) by mouth with snacks 06/28/19   [provider]  dextromethorphan-guaiFENesin (MUCINEX DM) 30-600 MG 12hr tablet Take 1 tablet by mouth 2 (two) times daily as needed for cough. 01/06/20   Lorella Nimrod, MD  hydrALAZINE (APRESOLINE) 100 MG tablet Take 1 tablet (100 mg total) by mouth 3 (three) times daily. 06/23/19   Jennye Boroughs, MD  irbesartan (AVAPRO) 75 MG tablet Take 1 tablet (75 mg total) by mouth at bedtime. 10/24/19   Max Sane, MD  isosorbide mononitrate (IMDUR) 60 MG 24 hr tablet Take 1 tablet (60 mg total) by mouth daily. 10/24/19   Max Sane, MD  ondansetron (ZOFRAN) 4 MG tablet Take 1 tablet (4 mg total) by mouth every 6 (six) hours as needed for nausea. 10/24/19   Max Sane, MD  oxyCODONE (OXY IR/ROXICODONE) 5 MG immediate release tablet Take 5-10 mg by mouth every 6 (six) hours as needed for severe pain.    [provider]  oxyCODONE (OXYCONTIN) 20 mg 12 hr tablet Take 20 mg by mouth every 12 (twelve) hours.    [provider]  pantoprazole (PROTONIX) 40 MG tablet Take 1 tablet (40 mg total) by mouth daily. 10/24/19   Max Sane, MD    Allergies Cyclobenzaprine, Clonidine, Furosemide, and Tylenol [acetaminophen]  No family history on file.  Social History Social History   Tobacco Use  . Smoking status: Current Every Day Smoker    Packs/day: 0.50    Types: Cigarettes  . Smokeless tobacco: Never Used  Substance Use Topics  . Alcohol use: No  . Drug use: No     Review of Systems Constitutional: No fever/chills Eyes: No visual changes. ENT: No sore throat. Cardiovascular: Endorses chest pain. Respiratory: Denies shortness of breath. Gastrointestinal: No abdominal pain.  No nausea, no vomiting.  No diarrhea. Genitourinary: Negative for dysuria. Musculoskeletal: Negative for acute arthralgias Skin: Negative for rash. Neurological: Negative for headaches, weakness/numbness/paresthesias in any extremity Psychiatric: Negative for suicidal ideation/homicidal ideation   ____________________________________________   PHYSICAL EXAM:  VITAL SIGNS: ED Triage Vitals  Enc Vitals Group     BP 09/13/20 1034 (!) 153/96     Pulse Rate 09/13/20 1034 70     Resp 09/13/20 1034 18     Temp 09/13/20 1034 97.9 F (36.6 C)     Temp Source 09/13/20 1034 Oral     SpO2 09/13/20 1034 99 %     Weight 09/13/20 1035 248 lb (112.5 kg)     Height 09/13/20 1035 6' (1.829 m)     Head Circumference --      Peak Flow --  Pain Score 09/13/20 1035 9     Pain Loc --      Pain Edu? --      Excl. in [place]? --    Constitutional: Alert and oriented. Well appearing and in no acute distress. Eyes: Conjunctivae are normal. PERRL. Head: Atraumatic. Nose: No congestion/rhinnorhea. Mouth/Throat: Mucous membranes are moist. Neck: No stridor Cardiovascular: Grossly normal heart sounds.  Good peripheral circulation. Respiratory: Normal respiratory effort.  No retractions. Gastrointestinal: Soft and nontender. No distention. Musculoskeletal: No obvious deformities Neurologic:  Normal speech and language. No gross focal neurologic deficits are appreciated. Skin:  Skin is warm and dry. No rash noted. Psychiatric: Mood and affect are normal. Speech and behavior are normal.  ____________________________________________   LABS (all labs ordered are listed, but only abnormal results are displayed)  Labs Reviewed  BASIC METABOLIC PANEL - Abnormal; Notable for the  following components:      Result Value   CO2 21 (*)    Glucose, Bld 105 (*)    BUN 64 (*)    Creatinine, Ser 10.27 (*)    Calcium 8.7 (*)    GFR, Estimated 5 (*)    Anion gap 16 (*)    All other components within normal limits  CBC - Abnormal; Notable for the following components:   RBC 2.98 (*)    Hemoglobin 8.7 (*)    HCT 26.6 (*)    RDW 15.9 (*)    Platelets 89 (*)    All other components within normal limits  TROPONIN I (HIGH SENSITIVITY) - Abnormal; Notable for the following components:   Troponin I (High Sensitivity) 98 (*)    All other components within normal limits  TROPONIN I (HIGH SENSITIVITY) - Abnormal; Notable for the following components:   Troponin I (High Sensitivity) 95 (*)    All other components within normal limits   ____________________________________________  EKG  ED ECG REPORT I, Naaman Plummer, the attending physician, personally viewed and interpreted this ECG.  Date: 09/13/2020 EKG Time: 1036 Rate: 86 Rhythm: Atrial fibrillation QRS Axis: normal Intervals: normal ST/T Wave abnormalities: normal Narrative Interpretation: no evidence of acute ischemia  ____________________________________________  RADIOLOGY  ED MD interpretation: 2 view chest x-ray shows no evidence of acute abnormalities including no pneumonia, pneumothorax, or widened mediastinum  Official radiology report(s): DG Chest 2 View  Result Date: 09/13/2020 CLINICAL DATA:  Chest pain EXAM: CHEST - 2 VIEW COMPARISON:  January 16, 2020 FINDINGS: The cardiomediastinal silhouette is unchanged and enlarged in contour. Trace fluid in the RIGHT minor fissure. No significant pleural effusion. Unchanged perihilar vascular congestion without overt edema. No pneumothorax. Unchanged LEFT base atelectasis. Visualized abdomen is unremarkable. Degenerative changes of the acromioclavicular joints. IMPRESSION: Unchanged cardiomegaly and perihilar vascular congestion without overt edema. Electronically  Signed   By: Valentino Saxon MD   On: 09/13/2020 11:15    ____________________________________________   PROCEDURES  Procedure(s) performed (including Critical Care):  .1-3 Lead EKG Interpretation Performed by: Naaman Plummer, MD Authorized by: Naaman Plummer, MD     Interpretation: abnormal     ECG rate:  85   ECG rate assessment: normal     Rhythm: atrial fibrillation     Ectopy: none     Conduction: normal       ____________________________________________   INITIAL IMPRESSION / ASSESSMENT AND PLAN / ED COURSE  As part of my medical decision making, I reviewed the following data within the [facility] notes reviewed and incorporated, Labs  reviewed, EKG interpreted, Old chart reviewed, Radiograph reviewed and Notes from prior ED visits reviewed and incorporated        Workup: ECG, CXR, CBC, BMP, Troponin Findings: ECG: No overt evidence of STEMI. No evidence of Brugadas sign, delta wave, epsilon wave, significantly prolonged QTc, or malignant arrhythmia HS Troponin: Negative x1 Other Labs unremarkable for emergent problems. CXR: Without PTX, PNA, or widened mediastinum Last Stress Test:  09/30/2019 Last Heart Catheterization:  09/30/2019 HEART Score: 3  Given History, Exam, and Workup I have low suspicion for ACS, Pneumothorax, Pneumonia, Pulmonary Embolus, Tamponade, Aortic Dissection or other emergent problem as a cause for this presentation.  Of note, PDMP shows patient having a 30-day prescription that was filled almost exactly 1 month ago and has not had any refills this month  Reassesment: Prior to discharge, patient complained that our hospital and staff "were not doing anything for his pain".  It was explained to the patient that he had no emergent pathology and that he would be discharged as long as his pain was under control.  Disposition: Before redressing his pain, patient eloped from our emergency department.  Nursing staff  was able to remove patient's IV prior to his elopement.      ____________________________________________   FINAL CLINICAL IMPRESSION(S) / ED DIAGNOSES  Final diagnoses:  Chest pain, unspecified type     ED Discharge Orders    None       Note:  This document was prepared using Dragon voice recognition software and may include unintentional dictation errors.   Naaman Plummer, MD 09/13/20 1339    Naaman Plummer, MD 09/13/20 1340

## 2020-09-13 NOTE — ED Triage Notes (Signed)
Pt c/o sharp stabbing, non radiating substernal chest pain for the past 2 days, denies any sx with it. Pt is in NAD

## 2021-01-05 ENCOUNTER — Encounter: Payer: Self-pay | Admitting: Internal Medicine

## 2021-01-05 ENCOUNTER — Other Ambulatory Visit: Payer: Self-pay

## 2021-01-05 ENCOUNTER — Observation Stay (HOSPITAL_COMMUNITY)
Admit: 2021-01-05 | Discharge: 2021-01-05 | Disposition: A | Discharge status: Home / Self Care | Payer: Medicare Other | Attending: Physician Assistant | Admitting: Physician Assistant

## 2021-01-05 ENCOUNTER — Observation Stay: Payer: Medicare Other

## 2021-01-05 ENCOUNTER — Emergency Department: Payer: Medicare Other

## 2021-01-05 ENCOUNTER — Inpatient Hospital Stay
Admission: EM | Admit: 2021-01-05 | Discharge: 2021-01-07 | DRG: 313 | Disposition: A | Discharge status: Home / Self Care | Payer: Medicare Other | Attending: Internal Medicine | Admitting: Internal Medicine

## 2021-01-05 DIAGNOSIS — I2510 Atherosclerotic heart disease of native coronary artery without angina pectoris: Secondary | ICD-10-CM | POA: Diagnosis present

## 2021-01-05 DIAGNOSIS — I25118 Atherosclerotic heart disease of native coronary artery with other forms of angina pectoris: Secondary | ICD-10-CM | POA: Diagnosis not present

## 2021-01-05 DIAGNOSIS — N2581 Secondary hyperparathyroidism of renal origin: Secondary | ICD-10-CM | POA: Diagnosis present

## 2021-01-05 DIAGNOSIS — I428 Other cardiomyopathies: Secondary | ICD-10-CM | POA: Diagnosis present

## 2021-01-05 DIAGNOSIS — I5020 Unspecified systolic (congestive) heart failure: Secondary | ICD-10-CM

## 2021-01-05 DIAGNOSIS — R079 Chest pain, unspecified: Secondary | ICD-10-CM

## 2021-01-05 DIAGNOSIS — I252 Old myocardial infarction: Secondary | ICD-10-CM

## 2021-01-05 DIAGNOSIS — I10 Essential (primary) hypertension: Secondary | ICD-10-CM

## 2021-01-05 DIAGNOSIS — L97829 Non-pressure chronic ulcer of other part of left lower leg with unspecified severity: Secondary | ICD-10-CM | POA: Diagnosis present

## 2021-01-05 DIAGNOSIS — I340 Nonrheumatic mitral (valve) insufficiency: Secondary | ICD-10-CM

## 2021-01-05 DIAGNOSIS — I5023 Acute on chronic systolic (congestive) heart failure: Secondary | ICD-10-CM | POA: Diagnosis not present

## 2021-01-05 DIAGNOSIS — I132 Hypertensive heart and chronic kidney disease with heart failure and with stage 5 chronic kidney disease, or end stage renal disease: Secondary | ICD-10-CM | POA: Diagnosis present

## 2021-01-05 DIAGNOSIS — E669 Obesity, unspecified: Secondary | ICD-10-CM | POA: Diagnosis present

## 2021-01-05 DIAGNOSIS — B1920 Unspecified viral hepatitis C without hepatic coma: Secondary | ICD-10-CM | POA: Diagnosis present

## 2021-01-05 DIAGNOSIS — Z6834 Body mass index (BMI) 34.0-34.9, adult: Secondary | ICD-10-CM

## 2021-01-05 DIAGNOSIS — Z20822 Contact with and (suspected) exposure to covid-19: Secondary | ICD-10-CM | POA: Diagnosis present

## 2021-01-05 DIAGNOSIS — Z79899 Other long term (current) drug therapy: Secondary | ICD-10-CM

## 2021-01-05 DIAGNOSIS — R609 Edema, unspecified: Secondary | ICD-10-CM

## 2021-01-05 DIAGNOSIS — I351 Nonrheumatic aortic (valve) insufficiency: Secondary | ICD-10-CM

## 2021-01-05 DIAGNOSIS — B1710 Acute hepatitis C without hepatic coma: Secondary | ICD-10-CM | POA: Diagnosis present

## 2021-01-05 DIAGNOSIS — I5042 Chronic combined systolic (congestive) and diastolic (congestive) heart failure: Secondary | ICD-10-CM | POA: Diagnosis present

## 2021-01-05 DIAGNOSIS — D696 Thrombocytopenia, unspecified: Secondary | ICD-10-CM | POA: Diagnosis present

## 2021-01-05 DIAGNOSIS — B964 Proteus (mirabilis) (morganii) as the cause of diseases classified elsewhere: Secondary | ICD-10-CM | POA: Diagnosis present

## 2021-01-05 DIAGNOSIS — Z8249 Family history of ischemic heart disease and other diseases of the circulatory system: Secondary | ICD-10-CM

## 2021-01-05 DIAGNOSIS — Z7982 Long term (current) use of aspirin: Secondary | ICD-10-CM

## 2021-01-05 DIAGNOSIS — Z86711 Personal history of pulmonary embolism: Secondary | ICD-10-CM

## 2021-01-05 DIAGNOSIS — I5032 Chronic diastolic (congestive) heart failure: Secondary | ICD-10-CM | POA: Diagnosis present

## 2021-01-05 DIAGNOSIS — D6959 Other secondary thrombocytopenia: Secondary | ICD-10-CM | POA: Diagnosis present

## 2021-01-05 DIAGNOSIS — Z96642 Presence of left artificial hip joint: Secondary | ICD-10-CM | POA: Diagnosis present

## 2021-01-05 DIAGNOSIS — Z992 Dependence on renal dialysis: Secondary | ICD-10-CM

## 2021-01-05 DIAGNOSIS — I77810 Thoracic aortic ectasia: Secondary | ICD-10-CM | POA: Diagnosis present

## 2021-01-05 DIAGNOSIS — Z7901 Long term (current) use of anticoagulants: Secondary | ICD-10-CM

## 2021-01-05 DIAGNOSIS — I480 Paroxysmal atrial fibrillation: Secondary | ICD-10-CM | POA: Diagnosis not present

## 2021-01-05 DIAGNOSIS — I4819 Other persistent atrial fibrillation: Secondary | ICD-10-CM | POA: Diagnosis present

## 2021-01-05 DIAGNOSIS — N186 End stage renal disease: Secondary | ICD-10-CM | POA: Diagnosis not present

## 2021-01-05 DIAGNOSIS — D631 Anemia in chronic kidney disease: Secondary | ICD-10-CM | POA: Diagnosis present

## 2021-01-05 DIAGNOSIS — R072 Precordial pain: Principal | ICD-10-CM | POA: Diagnosis present

## 2021-01-05 DIAGNOSIS — F17210 Nicotine dependence, cigarettes, uncomplicated: Secondary | ICD-10-CM | POA: Diagnosis present

## 2021-01-05 DIAGNOSIS — Z79891 Long term (current) use of opiate analgesic: Secondary | ICD-10-CM

## 2021-01-05 LAB — ECHOCARDIOGRAM COMPLETE
AR max vel: 2.78 cm2
AV Area VTI: 3.01 cm2
AV Area mean vel: 2.59 cm2
AV Mean grad: 9 mmHg
AV Peak grad: 15.2 mmHg
Ao pk vel: 1.95 m/s
Area-P 1/2: 3.9 cm2
Height: 72 in
MV VTI: 4.51 cm2
S' Lateral: 4.5 cm
Weight: 4064 oz

## 2021-01-05 LAB — COMPREHENSIVE METABOLIC PANEL
ALT: 13 U/L (ref 0–44)
AST: 15 U/L (ref 15–41)
Albumin: 3.4 g/dL — ABNORMAL LOW (ref 3.5–5.0)
Alkaline Phosphatase: 77 U/L (ref 38–126)
Anion gap: 11 (ref 5–15)
BUN: 31 mg/dL — ABNORMAL HIGH (ref 8–23)
CO2: 27 mmol/L (ref 22–32)
Calcium: 8.2 mg/dL — ABNORMAL LOW (ref 8.9–10.3)
Chloride: 100 mmol/L (ref 98–111)
Creatinine, Ser: 7.08 mg/dL — ABNORMAL HIGH (ref 0.61–1.24)
GFR, Estimated: 8 mL/min — ABNORMAL LOW (ref >60)
Glucose, Bld: 117 mg/dL — ABNORMAL HIGH (ref 70–99)
Potassium: 3.8 mmol/L (ref 3.5–5.1)
Sodium: 138 mmol/L (ref 135–145)
Total Bilirubin: 1 mg/dL (ref 0.3–1.2)
Total Protein: 6.8 g/dL (ref 6.5–8.1)

## 2021-01-05 LAB — URINE DRUG SCREEN, QUALITATIVE (ARMC ONLY)
Amphetamines, Ur Screen: NOT DETECTED
Barbiturates, Ur Screen: NOT DETECTED
Benzodiazepine, Ur Scrn: NOT DETECTED
Cannabinoid 50 Ng, Ur ~~LOC~~: NOT DETECTED
Cocaine Metabolite,Ur ~~LOC~~: NOT DETECTED
MDMA (Ecstasy)Ur Screen: NOT DETECTED
Methadone Scn, Ur: NOT DETECTED
Opiate, Ur Screen: POSITIVE — AB
Phencyclidine (PCP) Ur S: NOT DETECTED
Tricyclic, Ur Screen: NOT DETECTED

## 2021-01-05 LAB — CBC
HCT: 27 % — ABNORMAL LOW (ref 39.0–52.0)
Hemoglobin: 9.1 g/dL — ABNORMAL LOW (ref 13.0–17.0)
MCH: 29.4 pg (ref 26.0–34.0)
MCHC: 33.7 g/dL (ref 30.0–36.0)
MCV: 87.1 fL (ref 80.0–100.0)
Platelets: 100 10*3/uL — ABNORMAL LOW (ref 150–400)
RBC: 3.1 MIL/uL — ABNORMAL LOW (ref 4.22–5.81)
RDW: 15.9 % — ABNORMAL HIGH (ref 11.5–15.5)
WBC: 5.4 10*3/uL (ref 4.0–10.5)
nRBC: 0 % (ref 0.0–0.2)

## 2021-01-05 LAB — CREATININE, SERUM
Creatinine, Ser: 7.31 mg/dL — ABNORMAL HIGH (ref 0.61–1.24)
GFR, Estimated: 8 mL/min — ABNORMAL LOW (ref >60)

## 2021-01-05 LAB — TROPONIN I (HIGH SENSITIVITY)
Troponin I (High Sensitivity): 70 ng/L — ABNORMAL HIGH (ref <18)
Troponin I (High Sensitivity): 73 ng/L — ABNORMAL HIGH (ref <18)
Troponin I (High Sensitivity): 69 ng/L — ABNORMAL HIGH (ref <18)
Troponin I (High Sensitivity): 70 ng/L — ABNORMAL HIGH (ref <18)

## 2021-01-05 LAB — RESP PANEL BY RT-PCR (FLU A&B, COVID) ARPGX2
Influenza A by PCR: NEGATIVE
Influenza B by PCR: NEGATIVE
SARS Coronavirus 2 by RT PCR: NEGATIVE

## 2021-01-05 LAB — HEPATITIS B SURFACE ANTIGEN: Hepatitis B Surface Ag: NONREACTIVE

## 2021-01-05 LAB — BRAIN NATRIURETIC PEPTIDE: B Natriuretic Peptide: 4499 pg/mL — ABNORMAL HIGH (ref 0.0–100.0)

## 2021-01-05 LAB — HIV ANTIBODY (ROUTINE TESTING W REFLEX): HIV Screen 4th Generation wRfx: NONREACTIVE

## 2021-01-05 MED ORDER — ALBUTEROL SULFATE (2.5 MG/3ML) 0.083% IN NEBU: 3.0000 mL | INHALATION_SOLUTION | Freq: Four times a day (QID) | RESPIRATORY_TRACT | Status: DC | PRN

## 2021-01-05 MED ORDER — SENNA 8.6 MG PO TABS: 2.0000 | ORAL_TABLET | Freq: Every day | ORAL | Status: DC | PRN

## 2021-01-05 MED ORDER — IOHEXOL 350 MG/ML SOLN
100.0000 mL | Freq: Once | INTRAVENOUS | Status: AC | PRN
  Administered 2021-01-05: 100 mL via INTRAVENOUS

## 2021-01-05 MED ORDER — MORPHINE SULFATE (PF) 4 MG/ML IV SOLN
4.0000 mg | Freq: Once | INTRAVENOUS | Status: AC
  Administered 2021-01-05: 4 mg via INTRAVENOUS
  Filled 2021-01-05: qty 1

## 2021-01-05 MED ORDER — ACETAMINOPHEN 325 MG PO TABS: 650.0000 mg | ORAL_TABLET | ORAL | Status: DC | PRN

## 2021-01-05 MED ORDER — HYDROMORPHONE HCL 1 MG/ML IJ SOLN
1.0000 mg | INTRAMUSCULAR | Status: DC | PRN
  Administered 2021-01-05 – 2021-01-07 (×9): 1 mg via INTRAVENOUS
  Filled 2021-01-05 (×9): qty 1

## 2021-01-05 MED ORDER — BUMETANIDE 1 MG PO TABS
1.0000 mg | ORAL_TABLET | ORAL | Status: DC
  Administered 2021-01-05: 1 mg via ORAL
  Filled 2021-01-05 (×2): qty 1

## 2021-01-05 MED ORDER — BUMETANIDE 1 MG PO TABS
1.0000 mg | ORAL_TABLET | ORAL | Status: DC
  Administered 2021-01-05 – 2021-01-07 (×2): 1 mg via ORAL
  Filled 2021-01-05 (×3): qty 1

## 2021-01-05 MED ORDER — ONDANSETRON HCL 4 MG/2ML IJ SOLN: 4.0000 mg | Freq: Four times a day (QID) | INTRAMUSCULAR | Status: DC | PRN

## 2021-01-05 MED ORDER — TAMSULOSIN HCL 0.4 MG PO CAPS
0.4000 mg | ORAL_CAPSULE | Freq: Every day | ORAL | Status: DC
  Administered 2021-01-05 – 2021-01-06 (×2): 0.4 mg via ORAL
  Filled 2021-01-05: qty 1

## 2021-01-05 MED ORDER — HEPARIN SODIUM (PORCINE) 5000 UNIT/ML IJ SOLN
5000.0000 [IU] | Freq: Three times a day (TID) | INTRAMUSCULAR | Status: DC
  Administered 2021-01-05 – 2021-01-06 (×3): 5000 [IU] via SUBCUTANEOUS
  Filled 2021-01-05 (×3): qty 1

## 2021-01-05 MED ORDER — MORPHINE SULFATE (PF) 4 MG/ML IV SOLN
4.0000 mg | Freq: Once | INTRAVENOUS | Status: AC
Start: 2021-01-05 — End: 2021-01-05
  Administered 2021-01-05: 4 mg via INTRAVENOUS
  Filled 2021-01-05: qty 1

## 2021-01-05 MED ORDER — HYDRALAZINE HCL 50 MG PO TABS
100.0000 mg | ORAL_TABLET | Freq: Three times a day (TID) | ORAL | Status: DC
  Administered 2021-01-05 – 2021-01-07 (×4): 100 mg via ORAL
  Filled 2021-01-05 (×4): qty 2

## 2021-01-05 MED ORDER — ISOSORBIDE MONONITRATE ER 60 MG PO TB24
60.0000 mg | ORAL_TABLET | Freq: Every day | ORAL | Status: DC
  Administered 2021-01-05 – 2021-01-07 (×3): 60 mg via ORAL
  Filled 2021-01-05 (×3): qty 1

## 2021-01-05 MED ORDER — BUMETANIDE 1 MG PO TABS: 1.0000 mg | ORAL_TABLET | ORAL | Status: DC

## 2021-01-05 MED ORDER — CARVEDILOL 6.25 MG PO TABS
6.2500 mg | ORAL_TABLET | Freq: Two times a day (BID) | ORAL | Status: DC
  Administered 2021-01-05 – 2021-01-07 (×5): 6.25 mg via ORAL
  Filled 2021-01-05 (×5): qty 1

## 2021-01-05 MED ORDER — ASPIRIN 300 MG RE SUPP: 300.0000 mg | RECTAL | Status: DC

## 2021-01-05 MED ORDER — SODIUM CHLORIDE 0.9 % IV SOLN: 100.0000 mL | INTRAVENOUS | Status: DC | PRN

## 2021-01-05 MED ORDER — APIXABAN 2.5 MG PO TABS: 2.5000 mg | ORAL_TABLET | Freq: Two times a day (BID) | ORAL | Status: DC

## 2021-01-05 MED ORDER — FERRIC CITRATE 1 GM 210 MG(FE) PO TABS
410.0000 mg | ORAL_TABLET | Freq: Three times a day (TID) | ORAL | Status: DC
  Administered 2021-01-05 – 2021-01-07 (×5): 420 mg via ORAL
  Filled 2021-01-05 (×10): qty 2

## 2021-01-05 MED ORDER — NITROGLYCERIN 0.4 MG SL SUBL: 0.4000 mg | SUBLINGUAL_TABLET | SUBLINGUAL | Status: DC | PRN

## 2021-01-05 MED ORDER — LIDOCAINE HCL (PF) 1 % IJ SOLN
5.0000 mL | INTRAMUSCULAR | Status: DC | PRN
  Filled 2021-01-05: qty 5

## 2021-01-05 MED ORDER — ONDANSETRON HCL 4 MG/2ML IJ SOLN
4.0000 mg | Freq: Once | INTRAMUSCULAR | Status: AC
Start: 2021-01-05 — End: 2021-01-05
  Administered 2021-01-05: 4 mg via INTRAVENOUS
  Filled 2021-01-05: qty 2

## 2021-01-05 MED ORDER — HYDRALAZINE HCL 50 MG PO TABS
100.0000 mg | ORAL_TABLET | Freq: Two times a day (BID) | ORAL | Status: DC
  Administered 2021-01-05: 100 mg via ORAL
  Filled 2021-01-05: qty 2

## 2021-01-05 MED ORDER — AMLODIPINE BESYLATE 10 MG PO TABS
10.0000 mg | ORAL_TABLET | Freq: Every day | ORAL | Status: DC
  Administered 2021-01-05 – 2021-01-07 (×3): 10 mg via ORAL
  Filled 2021-01-05 (×2): qty 1
  Filled 2021-01-05: qty 2

## 2021-01-05 MED ORDER — ASPIRIN 81 MG PO CHEW
324.0000 mg | CHEWABLE_TABLET | Freq: Once | ORAL | Status: AC
  Administered 2021-01-05: 324 mg via ORAL
  Filled 2021-01-05: qty 4

## 2021-01-05 MED ORDER — POLYETHYLENE GLYCOL 3350 17 G PO PACK
17.0000 g | PACK | Freq: Every day | ORAL | Status: DC
  Administered 2021-01-05 – 2021-01-07 (×3): 17 g via ORAL
  Filled 2021-01-05 (×4): qty 1

## 2021-01-05 MED ORDER — HEPARIN SODIUM (PORCINE) 5000 UNIT/ML IJ SOLN: 5000.0000 [IU] | Freq: Three times a day (TID) | INTRAMUSCULAR | Status: DC

## 2021-01-05 MED ORDER — PENTAFLUOROPROP-TETRAFLUOROETH EX AERO
1.0000 "application " | INHALATION_SPRAY | CUTANEOUS | Status: DC | PRN
  Filled 2021-01-05: qty 30

## 2021-01-05 MED ORDER — HEPARIN SODIUM (PORCINE) 1000 UNIT/ML DIALYSIS
1000.0000 [IU] | INTRAMUSCULAR | Status: DC | PRN
  Filled 2021-01-05: qty 1

## 2021-01-05 MED ORDER — LIDOCAINE-PRILOCAINE 2.5-2.5 % EX CREA
1.0000 "application " | TOPICAL_CREAM | CUTANEOUS | Status: DC | PRN
  Filled 2021-01-05: qty 5

## 2021-01-05 MED ORDER — SEVELAMER CARBONATE 800 MG PO TABS
1600.0000 mg | ORAL_TABLET | Freq: Three times a day (TID) | ORAL | Status: DC
  Administered 2021-01-05 – 2021-01-07 (×5): 1600 mg via ORAL
  Filled 2021-01-05 (×9): qty 2

## 2021-01-05 MED ORDER — PANTOPRAZOLE SODIUM 40 MG PO TBEC
40.0000 mg | DELAYED_RELEASE_TABLET | Freq: Every day | ORAL | Status: DC
  Administered 2021-01-05 – 2021-01-07 (×3): 40 mg via ORAL
  Filled 2021-01-05 (×3): qty 1

## 2021-01-05 MED ORDER — PEG 3350-KCL-NA BICARB-NACL 420 G PO SOLR: 17.0000 g | Freq: Every day | ORAL | Status: DC | PRN

## 2021-01-05 MED ORDER — ALTEPLASE 2 MG IJ SOLR
2.0000 mg | Freq: Once | INTRAMUSCULAR | Status: DC | PRN
  Filled 2021-01-05: qty 2

## 2021-01-05 MED ORDER — ASPIRIN EC 81 MG PO TBEC
81.0000 mg | DELAYED_RELEASE_TABLET | Freq: Every day | ORAL | Status: DC
  Administered 2021-01-06 – 2021-01-07 (×2): 81 mg via ORAL
  Filled 2021-01-05 (×2): qty 1

## 2021-01-05 MED ORDER — CHLORHEXIDINE GLUCONATE CLOTH 2 % EX PADS
6.0000 | MEDICATED_PAD | Freq: Every day | CUTANEOUS | Status: DC
  Administered 2021-01-06 – 2021-01-07 (×2): 6 via TOPICAL
  Filled 2021-01-05: qty 6

## 2021-01-05 MED ORDER — ASPIRIN 81 MG PO CHEW: 324.0000 mg | CHEWABLE_TABLET | ORAL | Status: DC

## 2021-01-05 MED ORDER — ASPIRIN 81 MG PO TBEC: 81.0000 mg | DELAYED_RELEASE_TABLET | Freq: Every day | ORAL | Status: DC

## 2021-01-05 NOTE — ED Notes (Signed)
Called lab to obtain bloodwork at this time.

## 2021-01-05 NOTE — ED Triage Notes (Signed)
Pt in with co chest pain since yesterday, states started after dialysis. Pt states has hx of afib, hx of chest pain states "they had to pull fluid from my heart".

## 2021-01-05 NOTE — Progress Notes (Signed)
Pt developed chest pain 7/10, dialysis stopped, pt rinsed back, primary RN ashely notified, ekg obtained by primary rn.

## 2021-01-05 NOTE — ED Provider Notes (Signed)
[facility] Emergency Department Provider Note   ____________________________________________   Event Date/Time   First MD Initiated Contact with Patient 01/05/21 [phone]     (approximate)  I have reviewed the triage vital signs and the nursing notes.   HISTORY  Chief Complaint Chest Pain    HPI [name] is a 65 y.o. male who presents to the ED from home with a chief complaint of chest pain.  Patient has a history of ESRD on HD M/W/F.  Reports onset of left-sided chest pain after dialysis yesterday.  Also with a history of atrial fibrillation on Eliquis.  Reports a history of "they had to pull fluid from my heart".  Denies fever, cough, palpitations, shortness of breath, abdominal pain, nausea, vomiting, diaphoresis or dizziness.  Denies EtOH or illicit drug use.     Past Medical History:  Diagnosis Date   Depression    Diastolic dysfunction    a. 09/2019 Echo: EF 55-60%, no rwma, mod LVH, gr2 DD. Nl RV size/fxn. Mildly dil LA. Ao root 4.5cm.   Dilated aortic root ([place])    a. 09/2019 Echo: Ao root 4.5cm.   Elevated troponin level not due myocardial infarction    ESRD (end stage renal disease) ([place])    Hypertension    Nonobstructive Coronary Artery Disease    a. 09/2019 Cath: LM nl, LAD min irregs, D1 nl, D2 min irregs, LCX large, min irregs, RCA large, 20p, 30p/m.   PE (pulmonary embolism)    Renal insufficiency     Patient Active Problem List   Diagnosis Date Noted   Chest pain 01/05/2021   Cough    CAD (coronary artery disease) 01/05/2020   Chronic anticoagulation 01/05/2020   Acute bronchitis 01/05/2020   AF (paroxysmal atrial fibrillation) ([place]) 01/05/2020   History of substance abuse ([place]) 10/24/2019   Elevated troponin 10/24/2019   Nonspecific chest pain 10/24/2019   Chronic diastolic CHF (congestive heart failure) ([place]) 09/28/2019   History of MI (myocardial infarction) 09/28/2019   Anemia of chronic kidney failure, stage 5 ([place])  09/28/2019   History of pulmonary embolism 09/28/2019   Chronic midline low back pain    Acute respiratory failure with hypoxia (HCC)    Acute diastolic CHF (congestive heart failure) (HCC)    Anemia of chronic disease    Thrombocytopenia (HCC)    Congestive heart failure (HCC)    Fluid overload 06/21/2019   Volume overload 11/15/2018   ESRD (end stage renal disease) on dialysis ([place]) 11/14/2018   HCAP (healthcare-associated pneumonia) 08/27/2018   SOB (shortness of breath) 08/25/2018   Chest pain of uncertain etiology [id number]   ESRD on dialysis ([place]) 11/08/2016   Prostate cancer screening 11/08/2016   Acquired cyst of kidney 11/08/2016   Urinary urgency 11/08/2016   Acute on chronic renal failure ([place]) 09/23/2016   Hypertensive urgency 09/21/2016   Dysthymia 02/26/2016   Chronic pain 02/26/2016   Noncompliance with renal dialysis ([place]) 02/26/2016   Opiate abuse, continuous ([place]) 02/03/2016   Substance induced mood disorder ([place]) 02/03/2016   Antisocial personality disorder ([place]) 02/03/2016   Left-sided weakness 01/18/2016   HEPATITIS C 06/28/2008   HLD (hyperlipidemia) 06/28/2008   SUBSTANCE ABUSE, MULTIPLE 06/28/2008   Hypertension 06/27/2008    Past Surgical History:  Procedure Laterality Date   LEFT HEART CATH AND CORONARY ANGIOGRAPHY N/A 09/30/2019   Procedure: LEFT HEART CATH AND CORONARY ANGIOGRAPHY;  Surgeon: Wellington Hampshire, MD;  Location: [facility];  Service: Cardiovascular;  Laterality: N/A;   TOTAL HIP ARTHROPLASTY      Prior to Admission medications   Medication Sig Start Date End Date Taking? Authorizing Provider  albuterol (VENTOLIN HFA) 108 (90 Base) MCG/ACT inhaler Inhale 2 puffs into the lungs every 6 (six) hours as needed for wheezing or shortness of breath. 01/16/20   Blake Divine, MD  amLODipine (NORVASC) 10 MG tablet Take 1 tablet (10 mg total) by mouth daily. 06/23/19 06/22/20  Jennye Boroughs, MD  apixaban (ELIQUIS) 2.5 MG TABS tablet  Take 1 tablet (2.5 mg total) by mouth 2 (two) times daily. 06/23/19   Jennye Boroughs, MD  atorvastatin (LIPITOR) 40 MG tablet Take 1 tablet (40 mg total) by mouth at bedtime. 10/24/19 11/23/19  Max Sane, MD  AURYXIA 1 GM 210 MG(Fe) tablet Take 210 mg by mouth See admin instructions. Take 1 tablet ('210mg'$ ) by mouth three times daily with meals and take 1 tablet ('210mg'$ ) by mouth with snacks 06/28/19   [provider]  dextromethorphan-guaiFENesin (MUCINEX DM) 30-600 MG 12hr tablet Take 1 tablet by mouth 2 (two) times daily as needed for cough. 01/06/20   Lorella Nimrod, MD  hydrALAZINE (APRESOLINE) 100 MG tablet Take 1 tablet (100 mg total) by mouth 3 (three) times daily. 06/23/19   Jennye Boroughs, MD  irbesartan (AVAPRO) 75 MG tablet Take 1 tablet (75 mg total) by mouth at bedtime. 10/24/19   Max Sane, MD  isosorbide mononitrate (IMDUR) 60 MG 24 hr tablet Take 1 tablet (60 mg total) by mouth daily. 10/24/19   Max Sane, MD  ondansetron (ZOFRAN) 4 MG tablet Take 1 tablet (4 mg total) by mouth every 6 (six) hours as needed for nausea. 10/24/19   Max Sane, MD  oxyCODONE (OXY IR/ROXICODONE) 5 MG immediate release tablet Take 5-10 mg by mouth every 6 (six) hours as needed for severe pain.    [provider]  oxyCODONE (OXYCONTIN) 20 mg 12 hr tablet Take 20 mg by mouth every 12 (twelve) hours.    [provider]  pantoprazole (PROTONIX) 40 MG tablet Take 1 tablet (40 mg total) by mouth daily. 10/24/19   Max Sane, MD    Allergies Cyclobenzaprine, Clonidine, Furosemide, and Tylenol [acetaminophen]  No family history on file.  Social History Social History   Tobacco Use   Smoking status: Every Day    Packs/day: 0.50    Pack years: 0.00    Types: Cigarettes   Smokeless tobacco: Never  Substance Use Topics   Alcohol use: No   Drug use: No    Review of Systems  Constitutional: No fever/chills Eyes: No visual changes. ENT: No sore throat. Cardiovascular:  Positive for chest pain. Respiratory: Denies shortness of breath. Gastrointestinal: No abdominal pain.  No nausea, no vomiting.  No diarrhea.  No constipation. Genitourinary: Negative for dysuria. Musculoskeletal: Negative for back pain. Skin: Negative for rash. Neurological: Negative for headaches, focal weakness or numbness.   ____________________________________________   PHYSICAL EXAM:  VITAL SIGNS: ED Triage Vitals  Enc Vitals Group     BP 01/05/21 0234 (!) 174/90     Pulse Rate 01/05/21 0234 97     Resp 01/05/21 0234 (!) 22     Temp 01/05/21 0234 97.9 F (36.6 C)     Temp Source 01/05/21 0234 Oral     SpO2 01/05/21 0234 100 %     Weight 01/05/21 0231 254 lb (115.2 kg)     Height 01/05/21 0231 6' (1.829 m)     Head Circumference --  Peak Flow --      Pain Score 01/05/21 0231 9     Pain Loc --      Pain Edu? --      Excl. in [place]? --     Constitutional: Alert and oriented.  Chronically ill appearing, uncomfortable and in mild acute distress. Eyes: Conjunctivae are normal. PERRL. EOMI. Head: Atraumatic. Nose: No congestion/rhinnorhea. Mouth/Throat: Mucous membranes are moist.   Neck: No stridor.   Cardiovascular: Tachycardic rate, regular rhythm. Grossly normal heart sounds.  Good peripheral circulation. Respiratory: Increased respiratory effort.  No retractions. Lungs CTAB. Gastrointestinal: Soft and nontender to light or deep palpation. No distention. No abdominal bruits. No CVA tenderness. Musculoskeletal: No lower extremity tenderness.  2+ BLE pitting edema.  Shallow venous ulcer to left lower leg.  No joint effusions.  Palpable distal pulses. Neurologic:  Normal speech and language. No gross focal neurologic deficits are appreciated. No gait instability. Skin:  Skin is warm, dry and intact. No rash noted. Psychiatric: Mood and affect are normal. Speech and behavior are normal.  ____________________________________________   LABS (all labs ordered are  listed, but only abnormal results are displayed)  Labs Reviewed  CBC - Abnormal; Notable for the following components:      Result Value   RBC 3.10 (*)    Hemoglobin 9.1 (*)    HCT 27.0 (*)    RDW 15.9 (*)    Platelets 100 (*)    All other components within normal limits  COMPREHENSIVE METABOLIC PANEL - Abnormal; Notable for the following components:   Glucose, Bld 117 (*)    BUN 31 (*)    Creatinine, Ser 7.08 (*)    Calcium 8.2 (*)    Albumin 3.4 (*)    GFR, Estimated 8 (*)    All other components within normal limits  BRAIN NATRIURETIC PEPTIDE - Abnormal; Notable for the following components:   B Natriuretic Peptide 4,499.0 (*)    All other components within normal limits  TROPONIN I (HIGH SENSITIVITY) - Abnormal; Notable for the following components:   Troponin I (High Sensitivity) 70 (*)    All other components within normal limits  TROPONIN I (HIGH SENSITIVITY) - Abnormal; Notable for the following components:   Troponin I (High Sensitivity) 73 (*)    All other components within normal limits  RESP PANEL BY RT-PCR (FLU A&B, COVID) ARPGX2  AEROBIC/ANAEROBIC CULTURE W GRAM STAIN (SURGICAL/DEEP WOUND)  URINE DRUG SCREEN, QUALITATIVE ([facility] ONLY)   ____________________________________________  EKG  ED ECG REPORT I, Raidyn Breiner J, the attending physician, personally viewed and interpreted this ECG.   Date: 01/05/2021  EKG Time: 0233  Rate: 71  Rhythm: Accelerated junctional rhythm  Axis: Normal  Intervals:none  ST&T Change: Nonspecific  ____________________________________________  RADIOLOGY I, Janeece Blok J, personally viewed and evaluated these images (plain radiographs) as part of my medical decision making, as well as reviewing the written report by the radiologist.  ED MD interpretation: Cardiomegaly; CT chest demonstrates multivessel coronary artery calcification, trace pericardial fluid  Official radiology report(s): DG Chest 2 View  Result Date:  01/05/2021 CLINICAL DATA:  Short shortness of breath EXAM: CHEST - 2 VIEW COMPARISON:  09/13/2020 FINDINGS: Cardiomegaly. No confluent opacities, effusions or edema. No acute bony abnormality. IMPRESSION: Cardiomegaly.  No active disease. Electronically Signed   By: Rolm Baptise M.D.   On: 01/05/2021 02:52   CT Chest Wo Contrast  Result Date: 01/05/2021 CLINICAL DATA:  Chest pain, end-stage renal disease on hemodialysis, atrial fibrillation EXAM: CT CHEST  WITHOUT CONTRAST TECHNIQUE: Multidetector CT imaging of the chest was performed following the standard protocol without IV contrast. COMPARISON:  01/02/2020 FINDINGS: Cardiovascular: There is extensive multi-vessel coronary artery calcification present, similar to that noted on prior examination. Mild global cardiomegaly. Trace pericardial fluid. The central pulmonary arteries are enlarged in keeping with changes of pulmonary artery hypertension. Mild atherosclerotic calcification is seen within the thoracic aorta. No aortic aneurysm. Mediastinum/Nodes: The visualized thyroid is unremarkable. No pathologic thoracic adenopathy. The esophagus is unremarkable. Lungs/Pleura: Minimal biapical centrilobular emphysema. Lungs are otherwise clear. No pleural effusion or pneumothorax. The central airways are widely patent. Upper Abdomen: The kidneys are atrophic in keeping with the given history of chronic renal failure. No acute abnormality within the visualized upper abdomen. Musculoskeletal: No acute bone abnormality. No lytic or blastic bone lesion. IMPRESSION: Extensive multi-vessel coronary artery calcification. Mild global cardiomegaly. Morphologic changes in keeping with pulmonary arterial hypertension. Minimal biapical centrilobular emphysema. Aortic Atherosclerosis (ICD10-I70.0) and Emphysema (ICD10-J43.9). Electronically Signed   By: Fidela Salisbury MD   On: 01/05/2021 06:32    ____________________________________________   PROCEDURES  Procedure(s)  performed (including Critical Care):  .1-3 Lead EKG Interpretation  Date/Time: 01/05/2021 5:13 AM Performed by: Paulette Blanch, MD Authorized by: Paulette Blanch, MD     Interpretation: abnormal     ECG rate:  115   ECG rate assessment: tachycardic     Rhythm: sinus tachycardia     Ectopy: none     Conduction: normal   Comments:     Patient placed on cardiac monitor to evaluate for arrhythmias   ____________________________________________   INITIAL IMPRESSION / ASSESSMENT AND PLAN / ED COURSE  As part of my medical decision making, I reviewed the following data within the [place] notes reviewed and incorporated, Labs reviewed, EKG interpreted, Old chart reviewed, Radiograph reviewed, Discussed with admitting physician, and Notes from prior ED visits     65 year old male with chest pain. Differential diagnosis includes, but is not limited to, ACS, aortic dissection, pulmonary embolism, cardiac tamponade, pneumothorax, pneumonia, pericarditis, myocarditis, GI-related causes including esophagitis/gastritis, and musculoskeletal chest wall pain.     Initial troponin elevated at 70.  BNP> 4000.  Patient visibly uncomfortable; will administer IV morphine.  Obtain CT chest to evaluate for pericardial effusion.  Doubt cardiac tamponade as patient is not hypotensive, hypoxic, does not have distended neck veins or muffled heart sounds.  Clinical Course as of 01/05/21 0653  Tue Jan 05, 2021  0622 Wet read CT scan no large pericardial effusion.  Discussed with hospitalist services for admission. [JS]    Clinical Course User Index [JS] Paulette Blanch, MD     ____________________________________________   FINAL CLINICAL IMPRESSION(S) / ED DIAGNOSES  Final diagnoses:  Chest pain, unspecified type     ED Discharge Orders     None        Note:  This document was prepared using Dragon voice recognition software and may include unintentional dictation  errors.    Paulette Blanch, MD 01/05/21 [phone]

## 2021-01-05 NOTE — H&P (Addendum)
History and Physical    [name] [id number] DOB: 03/07/1956 DOA: 01/05/2021  PCP: Patient, No Pcp Per (Inactive)   Patient coming from: Home  I have personally briefly reviewed patient's old medical records in [place]  Chief Complaint: Chest pain  HPI: [name] is a 65 y.o. male with medical history significant for ESRD on HD MWF, CAD with history of MI but no PCI, history of PE on apixaban, diastolic heart failure, anemia of chronic disease,  chronic pain on opiates and history of cocaine use, with hospitalization from 09/28/19 09/30/2019 for evaluation of chest pain,  with left heart cath showing nonobstructive CAD with 20% stenosis proximal RCA and 30% mid RCA, who returns to the emergency room with complaints of midsternal chest pain that started after dialysis 1 day prior to his admission.  He rates his pain a 9 x 10 in intensity at its worst with radiation to the back and associated with shortness of breath, nausea and multiple episodes of emesis.  He has swelling involving his left leg and also has an ulcerated area over his left lower extremity with purulent drainage. He denies having any diaphoresis or palpitations.  He denies feeling dizzy or lightheaded and has had no fever, no chills, no headache, no abdominal pain, no changes in his bowel habits, no focal deficits, no blurred vision. Labs show sodium 138, potassium 3.8, chloride 100, bicarb 27, glucose 117, BUN 31, creatinine 7.08, calcium 8.2, alkaline phosphatase 77, albumin 3.4, AST 15, ALT 13, total protein 6.8, BNP 4499, troponin 70 >> 73, white count 5.4, hemoglobin 9.1, hematocrit 27, MCV 87.1, RDW 15.9, platelet count 100 Respiratory viral panel is negative Chest x-ray reviewed by me shows cardiomegaly with no active cardiopulmonary disease. CT scan of the chest without contrast shows extensive multi-vessel coronary artery calcification. Mild global cardiomegaly. Morphologic changes in keeping with  pulmonary arterial hypertension. Minimal biapical centrilobular emphysema. Aortic Atherosclerosis and Emphysema . Left lower extremity venous Doppler shows No evidence of deep venous thrombosis in the left lower extremity. Right common femoral vein also patent. Twelve-lead EKG reviewed by me shows junctional rhythm with T wave abnormality in the lateral leads.    ED Course: Patient is a 65 year old African-American male who has a history of coronary artery disease as well as end-stage renal disease on hemodialysis.  He presents to the ER for evaluation of midsternal chest pain associated with shortness of breath, nausea and multiple episodes of emesis.  He has a slight bump in his troponin and will be admitted to the hospital for further evaluation.     Review of Systems: As per HPI otherwise all other systems reviewed and negative.    Past Medical History:  Diagnosis Date   Depression    Diastolic dysfunction    a. 09/2019 Echo: EF 55-60%, no rwma, mod LVH, gr2 DD. Nl RV size/fxn. Mildly dil LA. Ao root 4.5cm.   Dilated aortic root ([place])    a. 09/2019 Echo: Ao root 4.5cm.   Elevated troponin level not due myocardial infarction    ESRD (end stage renal disease) ([place])    Hypertension    Nonobstructive Coronary Artery Disease    a. 09/2019 Cath: LM nl, LAD min irregs, D1 nl, D2 min irregs, LCX large, min irregs, RCA large, 20p, 30p/m.   PE (pulmonary embolism)    Renal insufficiency     Past Surgical History:  Procedure Laterality Date   LEFT HEART CATH AND CORONARY ANGIOGRAPHY N/A 09/30/2019  Procedure: LEFT HEART CATH AND CORONARY ANGIOGRAPHY;  Surgeon: Wellington Hampshire, MD;  Location: [facility];  Service: Cardiovascular;  Laterality: N/A;   TOTAL HIP ARTHROPLASTY       reports that he has been smoking cigarettes. He has been smoking an average of 0.50 packs per day. He has never used smokeless tobacco. He reports that he does not drink alcohol and does not use  drugs.  Allergies  Allergen Reactions   Cyclobenzaprine Nausea Only   Clonidine Other (See Comments)    Asymptomatic bradycardia to 38   Furosemide Other (See Comments)    Caused renal failure   Tylenol [Acetaminophen] Other (See Comments)    Reaction:  Pt states it bothers his liver    Family History  Problem Relation Age of Onset   Hypertension Mother       Prior to Admission medications   Medication Sig Start Date End Date Taking? Authorizing Provider  albuterol (VENTOLIN HFA) 108 (90 Base) MCG/ACT inhaler Inhale 2 puffs into the lungs every 6 (six) hours as needed for wheezing or shortness of breath. 01/16/20  Yes Blake Divine, MD  amLODipine (NORVASC) 10 MG tablet Take 1 tablet (10 mg total) by mouth daily. 06/23/19 01/05/21 Yes Jennye Boroughs, MD  aspirin 81 MG EC tablet Take 1 tablet by mouth daily. 06/02/16  Yes [provider]  atorvastatin (LIPITOR) 40 MG tablet Take 1 tablet (40 mg total) by mouth at bedtime. 10/24/19 01/05/21 Yes Max Sane, MD  AURYXIA 1 GM 210 MG(Fe) tablet Take 210 mg by mouth See admin instructions. Take 1 tablet ('210mg'$ ) by mouth three times daily with meals and take 1 tablet ('210mg'$ ) by mouth with snacks 06/28/19  Yes [provider]  bumetanide (BUMEX) 1 MG tablet Take 1 mg by mouth See admin instructions. Take 1 mg by mouth twice daily on non-dialysis days 10/12/20  Yes [provider]  carvedilol (COREG) 6.25 MG tablet Take 6.25 mg by mouth 2 (two) times daily. 10/12/20  Yes [provider]  [employer] SENNA 8.6 MG tablet Take 2 tablets by mouth daily as needed. 10/12/20  Yes [provider]  dextromethorphan-guaiFENesin (MUCINEX DM) 30-600 MG 12hr tablet Take 1 tablet by mouth 2 (two) times daily as needed for cough. 01/06/20  Yes Lorella Nimrod, MD  hydrALAZINE (APRESOLINE) 100 MG tablet Take 1 tablet (100 mg total) by mouth 3 (three) times daily. Patient taking differently: Take 100 mg by mouth 2 (two) times daily.  06/23/19  Yes Jennye Boroughs, MD  isosorbide mononitrate (IMDUR) 60 MG 24 hr tablet Take 1 tablet (60 mg total) by mouth daily. 10/24/19  Yes Max Sane, MD  ondansetron (ZOFRAN) 4 MG tablet Take 1 tablet (4 mg total) by mouth every 6 (six) hours as needed for nausea. 10/24/19  Yes Max Sane, MD  oxyCODONE (OXY IR/ROXICODONE) 5 MG immediate release tablet Take 5-10 mg by mouth every 6 (six) hours as needed for severe pain.   Yes [provider]  oxyCODONE (OXYCONTIN) 20 mg 12 hr tablet Take 20 mg by mouth every 12 (twelve) hours.   Yes [provider]  pantoprazole (PROTONIX) 40 MG tablet Take 1 tablet (40 mg total) by mouth daily. 10/24/19  Yes Max Sane, MD  polyethylene glycol-electrolytes (NULYTELY) 420 g solution Take 17 g by mouth daily as needed (constipation). 09/15/20  Yes [provider]  sevelamer carbonate (RENVELA) 800 MG tablet Take 1,600 mg by mouth 3 (three) times daily with meals. 06/10/20 06/10/21  Yes [provider]  tamsulosin (FLOMAX) 0.4 MG CAPS capsule Take 0.4 mg by mouth in the morning and at bedtime. 10/13/20  Yes [provider]  apixaban (ELIQUIS) 2.5 MG TABS tablet Take 1 tablet (2.5 mg total) by mouth 2 (two) times daily. Patient not taking: Reported on 01/05/2021 06/23/19   Jennye Boroughs, MD  irbesartan (AVAPRO) 75 MG tablet Take 1 tablet (75 mg total) by mouth at bedtime. Patient not taking: No sig reported 10/24/19   Max Sane, MD    Physical Exam: Vitals:   01/05/21 0530 01/05/21 0615 01/05/21 0718 01/05/21 0730  BP: (!) 144/95 (!) 173/107 (!) 146/86 (!) 153/88  Pulse: 83 80 82 77  Resp: (!) 27 (!) '25 19 20  '$ Temp:      TempSrc:      SpO2: 98% 97% 97% 92%  Weight:      Height:         Vitals:   01/05/21 0530 01/05/21 0615 01/05/21 0718 01/05/21 0730  BP: (!) 144/95 (!) 173/107 (!) 146/86 (!) 153/88  Pulse: 83 80 82 77  Resp: (!) 27 (!) '25 19 20  '$ Temp:      TempSrc:      SpO2: 98% 97% 97% 92%  Weight:       Height:          Constitutional: Alert and oriented x 3 . Not in any apparent distress HEENT:      Head: Normocephalic and atraumatic.         Eyes: PERLA, EOMI, Conjunctivae are normal. Sclera is non-icteric.       Mouth/Throat: Mucous membranes are moist.       Neck: Supple with no signs of meningismus. Cardiovascular: Regular rate and rhythm. No murmurs, gallops, or rubs. 2+ symmetrical distal pulses are present . No JVD.  Bilateral LE edema (Lt > Rt) Respiratory: Respiratory effort normal .Lungs sounds clear bilaterally. No wheezes, crackles, or rhonchi.  Gastrointestinal: Soft, non tender, and non distended with positive bowel sounds.  Central adiposity Genitourinary: No CVA tenderness. Musculoskeletal: Nontender with normal range of motion in all extremities. No cyanosis, or erythema of extremities. Neurologic:  Face is symmetric. Moving all extremities. No gross focal neurologic deficits . Skin: Skin is warm, dry.  ulceration involving left lower extremity Psychiatric: Mood and affect are normal    Labs on Admission: I have personally reviewed following labs and imaging studies  CBC: Recent Labs  Lab 01/05/21 0234  WBC 5.4  HGB 9.1*  HCT 27.0*  MCV 87.1  PLT 123XX123*   Basic Metabolic Panel: Recent Labs  Lab 01/05/21 0234  NA 138  K 3.8  CL 100  CO2 27  GLUCOSE 117*  BUN 31*  CREATININE 7.08*  CALCIUM 8.2*   GFR: Estimated Creatinine Clearance: 14.2 mL/min (A) (by C-G formula based on SCr of 7.08 mg/dL (H)). Liver Function Tests: Recent Labs  Lab 01/05/21 0234  AST 15  ALT 13  ALKPHOS 77  BILITOT 1.0  PROT 6.8  ALBUMIN 3.4*   No results for input(s): LIPASE, AMYLASE in the last 168 hours. No results for input(s): AMMONIA in the last 168 hours. Coagulation Profile: No results for input(s): INR, PROTIME in the last 168 hours. Cardiac Enzymes: No results for input(s): CKTOTAL, CKMB, CKMBINDEX, TROPONINI in the last 168 hours. BNP (last 3 results) No  results for input(s): PROBNP in the last 8760 hours. HbA1C: No results for input(s): HGBA1C in the last 72 hours. CBG: No results for  input(s): GLUCAP in the last 168 hours. Lipid Profile: No results for input(s): CHOL, HDL, LDLCALC, TRIG, CHOLHDL, LDLDIRECT in the last 72 hours. Thyroid Function Tests: No results for input(s): TSH, T4TOTAL, FREET4, T3FREE, THYROIDAB in the last 72 hours. Anemia Panel: No results for input(s): VITAMINB12, FOLATE, FERRITIN, TIBC, IRON, RETICCTPCT in the last 72 hours. Urine analysis:    Component Value Date/Time   COLORURINE YELLOW (A) 08/20/2018 0230   APPEARANCEUR CLOUDY (A) 08/20/2018 0230   APPEARANCEUR Clear 11/08/2014 1559   LABSPEC 1.010 08/20/2018 0230   LABSPEC 1.010 11/08/2014 1559   PHURINE 9.0 (H) 08/20/2018 0230   GLUCOSEU NEGATIVE 08/20/2018 0230   GLUCOSEU Negative 11/08/2014 1559   HGBUR NEGATIVE 08/20/2018 0230   BILIRUBINUR NEGATIVE 08/20/2018 0230   BILIRUBINUR Negative 11/08/2014 1559   KETONESUR NEGATIVE 08/20/2018 0230   PROTEINUR 100 (A) 08/20/2018 0230   UROBILINOGEN 1.0 10/28/2010 1100   NITRITE POSITIVE (A) 08/20/2018 0230   LEUKOCYTESUR LARGE (A) 08/20/2018 0230   LEUKOCYTESUR Negative 11/08/2014 1559    Radiological Exams on Admission: DG Chest 2 View  Result Date: 01/05/2021 CLINICAL DATA:  Short shortness of breath EXAM: CHEST - 2 VIEW COMPARISON:  09/13/2020 FINDINGS: Cardiomegaly. No confluent opacities, effusions or edema. No acute bony abnormality. IMPRESSION: Cardiomegaly.  No active disease. Electronically Signed   By: Rolm Baptise M.D.   On: 01/05/2021 02:52   CT Chest Wo Contrast  Result Date: 01/05/2021 CLINICAL DATA:  Chest pain, end-stage renal disease on hemodialysis, atrial fibrillation EXAM: CT CHEST WITHOUT CONTRAST TECHNIQUE: Multidetector CT imaging of the chest was performed following the standard protocol without IV contrast. COMPARISON:  01/02/2020 FINDINGS: Cardiovascular: There is extensive  multi-vessel coronary artery calcification present, similar to that noted on prior examination. Mild global cardiomegaly. Trace pericardial fluid. The central pulmonary arteries are enlarged in keeping with changes of pulmonary artery hypertension. Mild atherosclerotic calcification is seen within the thoracic aorta. No aortic aneurysm. Mediastinum/Nodes: The visualized thyroid is unremarkable. No pathologic thoracic adenopathy. The esophagus is unremarkable. Lungs/Pleura: Minimal biapical centrilobular emphysema. Lungs are otherwise clear. No pleural effusion or pneumothorax. The central airways are widely patent. Upper Abdomen: The kidneys are atrophic in keeping with the given history of chronic renal failure. No acute abnormality within the visualized upper abdomen. Musculoskeletal: No acute bone abnormality. No lytic or blastic bone lesion. IMPRESSION: Extensive multi-vessel coronary artery calcification. Mild global cardiomegaly. Morphologic changes in keeping with pulmonary arterial hypertension. Minimal biapical centrilobular emphysema. Aortic Atherosclerosis (ICD10-I70.0) and Emphysema (ICD10-J43.9). Electronically Signed   By: Fidela Salisbury MD   On: 01/05/2021 06:32   US Venous Img Lower Unilateral Left (DVT)  Result Date: 01/05/2021 CLINICAL DATA:  Lower extremity pain and edema EXAM: LEFT LOWER EXTREMITY VENOUS DUPLEX ULTRASOUND TECHNIQUE: Gray-scale sonography with graded compression, as well as color Doppler and duplex ultrasound were performed to evaluate the left lower extremity deep venous system from the level of the common femoral vein and including the common femoral, femoral, profunda femoral, popliteal and calf veins including the posterior tibial, peroneal and gastrocnemius veins when visible. The superficial great saphenous vein was also interrogated. Spectral Doppler was utilized to evaluate flow at rest and with distal augmentation maneuvers in the common femoral, femoral and popliteal  veins. COMPARISON:  February 19, 2014 FINDINGS: Contralateral Common Femoral Vein: Respiratory phasicity is normal and symmetric with the symptomatic side. No evidence of thrombus. Normal compressibility. Common Femoral Vein: No evidence of thrombus. Normal compressibility, respiratory phasicity and response to augmentation. Saphenofemoral Junction: No  evidence of thrombus. Normal compressibility and flow on color Doppler imaging. Profunda Femoral Vein: No evidence of thrombus. Normal compressibility and flow on color Doppler imaging. Femoral Vein: No evidence of thrombus. Normal compressibility, respiratory phasicity and response to augmentation. Popliteal Vein: No evidence of thrombus. Normal compressibility, respiratory phasicity and response to augmentation. Calf Veins: No evidence of thrombus. Normal compressibility and flow on color Doppler imaging. Superficial Great Saphenous Vein: No evidence of thrombus. Normal compressibility. Venous Reflux:  None. Other Findings:  None. IMPRESSION: No evidence of deep venous thrombosis in the left lower extremity. Right common femoral vein also patent. Electronically Signed   By: Lowella Grip III M.D.   On: 01/05/2021 09:13     Assessment/Plan Principal Problem:   Chest pain Active Problems:   HEPATITIS C   ESRD (end stage renal disease) on dialysis (HCC)   Thrombocytopenia (HCC)   Chronic diastolic CHF (congestive heart failure) (HCC)   CAD (coronary artery disease)   AF (paroxysmal atrial fibrillation) (HCC)    Chest pain of uncertain etiology   History of coronary artery disease with mildly elevated troponin Continues to have chest pain which he rates 10 x 10 in intensity at its worst  Recent left heart cath showing nonobstructive CAD with 30% stenosis mid RCA, 20% proximal RCA. Will place patient on IV Dilaudid as needed for pain control Continue carvedilol, nitrates, statins and aspirin Cardiology consultation         History of substance  abuse [facility]) Patient denies recent use of cocaine      History of pulmonary embolism Patient no longer on anticoagulation with apixaban due to bleeding from his AV fistula.       ESRD (end stage renal disease) on dialysis [facility]) With associated anemia  Dialysis days are Monday /Wednesday /Friday Hemoglobin at baseline Nephrology consult for continuation of dialysis       Chronic diastolic CHF (congestive heart failure) ([place]) Not acutely exacerbated Continue Bumex, carvedilol, hydralazine and nitrates Patient not on an ACE/ARB due to renal insufficiency     Hypertension Continue amlodipine, carvedilol, hydralazine and nitrates    Thrombocytopenia Unclear etiology possible secondary to portal hypertension from known hepatitis C Monitor platelet count closely  DVT prophylaxis: Heparin Code Status: full code  Family Communication: Greater than 50% of time was spent discussing patient's condition and plan of care with him at the bedside.  All questions and concerns have been addressed.  He verbalizes understanding and agrees with the plan. Disposition Plan: Back to previous home environment Consults called: Cardiology/nephrology Status: At the time of admission, it appears that the appropriate admission status for this patient is inpatient. This is judged to be reasonable and necessary in order to provide the required intensity of service to ensure the patient's safety given the presenting symptoms, physical exam findings, and initial radiographic and laboratory data in the context of their comorbid conditions. Patient requires inpatient status due to high intensity of service, high risk for further deterioration and high frequency of surveillance required.    Collier Bullock MD [facility]     01/05/2021, 10:20 AM

## 2021-01-05 NOTE — ED Notes (Signed)
Dialysis M/W/F, last treatment yesterday (Monday)

## 2021-01-05 NOTE — Progress Notes (Signed)
Contacted by Dr. Saunders Revel from cardiology about patient's 2D echocardiogram finding.  The aorta appears larger than on prior studies.  He recommends a CT angiogram of the chest to rule out both PE as well as aortic dissection. Patient has a history of PE but is not on anticoagulation due to bleeding from his AV fistula. Discussed the need for CT angiogram with patient in detail and he is in agreement for testing to be done. Also discussed with nephrology who is in agreement for CT angiogram to be done and patient will be dialyzed in a.m.

## 2021-01-05 NOTE — ED Notes (Signed)
IV team at bedside at this time. 

## 2021-01-05 NOTE — ED Notes (Addendum)
Pt still requesting food at this time. Explained that I have not heard from cardiology yet and that I cannot just take his word for it at this time.

## 2021-01-05 NOTE — Consult Note (Signed)
Cardiology Consultation:   Patient ID: DHANI FAHRER; [id number]; June 05, 1956   Admit date: 01/05/2021 Date of Consult: 01/05/2021  Primary Care Provider: Patient, No Pcp Per (Inactive) Primary Cardiologist: Fletcher Anon Primary Electrophysiologist:  None   Patient Profile:   [name] is a 65 y.o. male with a hx of recurrent chest pain with nonobstructive CAD by [facility] on 09/30/2019, HFmrEF, dilated aortic root, ESRD on HD with nonadherence, recurrent PE treated with anticoagulation, substance abuse including cocaine and tobacco, cirrhosis, anemia of chronic disease, HTN, and chronic back pain who is being seen today for the evaluation of chest pain at the request of Dr. Francine Graven.  History of Present Illness:   Mr. Corwell was admitted in 09/2019 with chest pain and mildly elevated troponin.  Diagnostic LHC was performed on 09/30/2019 and revealed minimal, nonobstructive CAD.  Echo at that time showed an EF of 55 to 60%, no regional wall motion abnormalities, grade 2 diastolic dysfunction, normal RV systolic function and ventricular cavity size, mildly dilated left atrium, and a dilated aortic root measuring 4.5 cm.  He has not followed up as an outpatient with [facility] or [facility] cardiology.  He has been seen in the ED at [facility], and [facility] multiple times this year for complaints of chest pain, malaise, and dyspnea.  He was admitted to [facility] in late 09/2020 with chest pain with work-up felt to be noncardiac with multiple stable EKGs and flat/downtrending high-sensitivity troponins.  Symptoms were felt to be related to volume overload in the setting of missed hemodialysis, constipation, and gas.  He was admitted in 10/2020 with similar symptoms of chest pain and volume overload in the setting of noncompliance with hemodialysis.  CTA of the chest was negative for PE at that time.  Since that admission, he has been seen in the [facility] ED twice, once in late 11/2020 for shortness of breath, and most recently  on 12/20/2020 for chest pain.  Symptoms were felt to be in the setting of volume overload and improved with hemodialysis.  He presented to [facility] on 6/27 after developing sharp left arm pain and substernal chest discomfort during hemodialysis earlier that day with symptoms persisting into the evening hours.  Symptoms felt very similar to his prior PEs.  There was some associated shortness of breath.  No palpitations, dizziness, presyncope, or syncope.  He notes some worsening lower extremity swelling as well.  Initially, he was advised to proceed to the ED from the dialysis center, however he preferred to go back home, though subsequently presented to the ED overnight due to persisting symptoms.  Upon his arrival, his BP was elevated in the [id number] systolic.  High-sensitivity troponin was minimally elevated at 70 with a delta of 73, subsequently down trending.  BNP elevated at 4499.  Urine drug screen pending.  EKG showed A. fib with controlled ventricular response.  Chest x-ray showed no active disease with cardiomegaly.  CT chest without contrast showed biapical emphysema as well as "extensive multivessel coronary artery calcification" and morphologic changes concerning for pulmonary hypertension.  In the ED, he received ASA 324 mg x 1 and morphine.  His chest pain has subsequently resolved with Dilaudid.  BP remains elevated in the 123456 systolic.   Past Medical History:  Diagnosis Date   Depression    Diastolic dysfunction    a. 09/2019 Echo: EF 55-60%, no rwma, mod LVH, gr2 DD. Nl RV size/fxn. Mildly dil LA. Ao root 4.5cm.   Dilated aortic root ([place])  a. 09/2019 Echo: Ao root 4.5cm.   Elevated troponin level not due myocardial infarction    ESRD (end stage renal disease) ([place])    Hypertension    Nonobstructive Coronary Artery Disease    a. 09/2019 Cath: LM nl, LAD min irregs, D1 nl, D2 min irregs, LCX large, min irregs, RCA large, 20p, 30p/m.   PE (pulmonary embolism)    Renal insufficiency      Past Surgical History:  Procedure Laterality Date   LEFT HEART CATH AND CORONARY ANGIOGRAPHY N/A 09/30/2019   Procedure: LEFT HEART CATH AND CORONARY ANGIOGRAPHY;  Surgeon: Wellington Hampshire, MD;  Location: [place] [facility];  Service: Cardiovascular;  Laterality: N/A;   TOTAL HIP ARTHROPLASTY       Home Meds: Prior to Admission medications   Medication Sig Start Date End Date Taking? Authorizing Provider  albuterol (VENTOLIN HFA) 108 (90 Base) MCG/ACT inhaler Inhale 2 puffs into the lungs every 6 (six) hours as needed for wheezing or shortness of breath. 01/16/20  Yes Blake Divine, MD  amLODipine (NORVASC) 10 MG tablet Take 1 tablet (10 mg total) by mouth daily. 06/23/19 01/05/21 Yes Jennye Boroughs, MD  aspirin 81 MG EC tablet Take 1 tablet by mouth daily. 06/02/16  Yes [provider]  atorvastatin (LIPITOR) 40 MG tablet Take 1 tablet (40 mg total) by mouth at bedtime. 10/24/19 01/05/21 Yes Max Sane, MD  AURYXIA 1 GM 210 MG(Fe) tablet Take 210 mg by mouth See admin instructions. Take 1 tablet ('210mg'$ ) by mouth three times daily with meals and take 1 tablet ('210mg'$ ) by mouth with snacks 06/28/19  Yes [provider]  bumetanide (BUMEX) 1 MG tablet Take 1 mg by mouth See admin instructions. Take 1 mg by mouth twice daily on non-dialysis days 10/12/20  Yes [provider]  carvedilol (COREG) 6.25 MG tablet Take 6.25 mg by mouth 2 (two) times daily. 10/12/20  Yes [provider]  [employer] SENNA 8.6 MG tablet Take 2 tablets by mouth daily as needed. 10/12/20  Yes [provider]  dextromethorphan-guaiFENesin (MUCINEX DM) 30-600 MG 12hr tablet Take 1 tablet by mouth 2 (two) times daily as needed for cough. 01/06/20  Yes Lorella Nimrod, MD  hydrALAZINE (APRESOLINE) 100 MG tablet Take 1 tablet (100 mg total) by mouth 3 (three) times daily. Patient taking differently: Take 100 mg by mouth 2 (two) times daily. 06/23/19  Yes Jennye Boroughs, MD  isosorbide  mononitrate (IMDUR) 60 MG 24 hr tablet Take 1 tablet (60 mg total) by mouth daily. 10/24/19  Yes Max Sane, MD  ondansetron (ZOFRAN) 4 MG tablet Take 1 tablet (4 mg total) by mouth every 6 (six) hours as needed for nausea. 10/24/19  Yes Max Sane, MD  oxyCODONE (OXY IR/ROXICODONE) 5 MG immediate release tablet Take 5-10 mg by mouth every 6 (six) hours as needed for severe pain.   Yes [provider]  oxyCODONE (OXYCONTIN) 20 mg 12 hr tablet Take 20 mg by mouth every 12 (twelve) hours.   Yes [provider]  pantoprazole (PROTONIX) 40 MG tablet Take 1 tablet (40 mg total) by mouth daily. 10/24/19  Yes Max Sane, MD  polyethylene glycol-electrolytes (NULYTELY) 420 g solution Take 17 g by mouth daily as needed (constipation). 09/15/20  Yes [provider]  sevelamer carbonate (RENVELA) 800 MG tablet Take 1,600 mg by mouth 3 (three) times daily with meals. 06/10/20 06/10/21 Yes [provider]  tamsulosin (FLOMAX) 0.4 MG CAPS capsule Take 0.4 mg by mouth in  the morning and at bedtime. 10/13/20  Yes [provider]  apixaban (ELIQUIS) 2.5 MG TABS tablet Take 1 tablet (2.5 mg total) by mouth 2 (two) times daily. Patient not taking: Reported on 01/05/2021 06/23/19   Jennye Boroughs, MD  irbesartan (AVAPRO) 75 MG tablet Take 1 tablet (75 mg total) by mouth at bedtime. Patient not taking: No sig reported 10/24/19   Max Sane, MD    Inpatient Medications: Scheduled Meds:  amLODipine  10 mg Oral Daily   [START ON 01/06/2021] aspirin EC  81 mg Oral Daily   bumetanide  1 mg Oral Once per day on Sun Tue Thu Sat   And   bumetanide  1 mg Oral Once per day on Sun Tue Thu Sat   carvedilol  6.25 mg Oral BID   [START ON 01/06/2021] Chlorhexidine Gluconate Cloth  6 each Topical Q0600   ferric citrate  420 mg Oral TID WC   heparin injection (subcutaneous)  5,000 Units Subcutaneous Q8H   hydrALAZINE  100 mg Oral BID   isosorbide mononitrate  60 mg Oral Daily   pantoprazole   40 mg Oral Daily   polyethylene glycol  17 g Oral Daily   sevelamer carbonate  1,600 mg Oral TID WC   tamsulosin  0.4 mg Oral QPC supper   Continuous Infusions:  sodium chloride     sodium chloride     PRN Meds: sodium chloride, sodium chloride, acetaminophen, albuterol, alteplase, heparin, HYDROmorphone (DILAUDID) injection, lidocaine (PF), lidocaine-prilocaine, nitroGLYCERIN, ondansetron (ZOFRAN) IV, pentafluoroprop-tetrafluoroeth, senna  Allergies:   Allergies  Allergen Reactions   Cyclobenzaprine Nausea Only   Clonidine Other (See Comments)    Asymptomatic bradycardia to 38   Furosemide Other (See Comments)    Caused renal failure   Tylenol [Acetaminophen] Other (See Comments)    Reaction:  Pt states it bothers his liver    Social History:   Social History   Socioeconomic History   Marital status: Divorced    Spouse name: Not on file   Number of children: Not on file   Years of education: Not on file   Highest education level: Not on file  Occupational History   Not on file  Tobacco Use   Smoking status: Every Day    Packs/day: 0.50    Pack years: 0.00    Types: Cigarettes   Smokeless tobacco: Never  Substance and Sexual Activity   Alcohol use: No   Drug use: No   Sexual activity: Not on file  Other Topics Concern   Not on file  Social History Narrative   Not on file   Social Determinants of Health   Financial Resource Strain: Not on file  Food Insecurity: Not on file  Transportation Needs: Not on file  Physical Activity: Not on file  Stress: Not on file  Social Connections: Not on file  Intimate Partner Violence: Not on file     Family History:   Family History  Problem Relation Age of Onset   Hypertension Mother     ROS:  Review of Systems  Constitutional:  Positive for malaise/fatigue. Negative for chills, diaphoresis, fever and weight loss.  HENT:  Negative for congestion.   Eyes:  Negative for discharge and redness.  Respiratory:   Positive for shortness of breath. Negative for cough, sputum production and wheezing.   Cardiovascular:  Positive for chest pain. Negative for palpitations, orthopnea, claudication, leg swelling and PND.  Gastrointestinal:  Negative for abdominal pain, heartburn, nausea and vomiting.  Musculoskeletal:  Positive for joint pain. Negative for falls and myalgias.  Skin:  Negative for rash.  Neurological:  Positive for weakness. Negative for dizziness, tingling, tremors, sensory change, speech change, focal weakness and loss of consciousness.  Endo/Heme/Allergies:  Does not bruise/bleed easily.  Psychiatric/Behavioral:  Negative for substance abuse. The patient is not nervous/anxious.   All other systems reviewed and are negative.    Physical Exam/Data:   Vitals:   01/05/21 0615 01/05/21 0718 01/05/21 0730 01/05/21 1000  BP: (!) 173/107 (!) 146/86 (!) 153/88 (!) 169/119  Pulse: 80 82 77 81  Resp: (!) '25 19 20 16  '$ Temp:      TempSrc:      SpO2: 97% 97% 92% 95%  Weight:      Height:       No intake or output data in the 24 hours ending 01/05/21 1350 Filed Weights   01/05/21 0231  Weight: 115.2 kg   Body mass index is 34.45 kg/m.   Physical Exam: General: Well developed, well nourished, in no acute distress. Head: Normocephalic, atraumatic, sclera non-icteric, no xanthomas, nares without discharge.  Neck: Negative for carotid bruits. JVD not elevated. Lungs: Diminished bibasilar breath sounds. Breathing is unlabored. Heart: IRIR with S1 S2. I/VI systolic murmur LSB, no rubs, or gallops appreciated. Abdomen: Soft, non-tender, non-distended with normoactive bowel sounds. No hepatomegaly. No rebound/guarding. No obvious abdominal masses. Msk:  Strength and tone appear normal for age. Extremities: No clubbing or cyanosis. 1+ bilateral lower extremity pitting edema. Distal pedal pulses are 2+ and equal bilaterally. Neuro: Alert and oriented X 3. No facial asymmetry. No focal deficit. Moves  all extremities spontaneously. Psych:  Responds to questions appropriately with a normal affect.   EKG:  The EKG was personally reviewed and demonstrates: A. fib, 71 bpm, baseline wandering, poor R wave progression along the precordial leads, lateral T wave inversion which has previously been noted on tracings Telemetry:  Telemetry was personally reviewed and demonstrates: Afib  Weights: Filed Weights   01/05/21 0231  Weight: 115.2 kg    Relevant CV Studies:  2D echo 10/10/2020 [facility]):  1. The left ventricle is normal in size with moderately increased wall  thickness.    2. The left ventricular systolic function is normal, LVEF is visually  estimated at > 55%.    3. The mitral valve leaflets are mildly thickened with normal leaflet  mobility.    4. There is mild mitral valve regurgitation.    5. The aortic valve is trileaflet with moderately thickened leaflets with  moderately reduced excursion.    6. There is mild aortic regurgitation.    7. The aorta at the sinuses of Valsalva is moderately dilated.    8. The left atrium is moderately dilated in size.    9. The right ventricle is normal in size, with normal systolic function.    10. The right atrium is moderately dilated  in size.    11. There is a small posterior pericardial effusion.    12. Limited study to assess ventricular function and pericardial effusion..  __________  2D echo 08/06/2020 [facility]): 1. The left ventricle is normal in size with severely increased wall  thickness, septal wall > posterior wall.    2. The left ventricular systolic function is mildly decreased, LVEF is  visually estimated at 45-50%.    3. The left atrium is moderately dilated in size.    4. Mitral annular calcification is present.    5. The aortic valve  is probably trileaflet with moderately thickened  leaflets with normal excursion.    6. The right ventricle is dilated in size, with mildly reduced systolic  function.    7. The right atrium is  moderately dilated  in size.    8. The aorta at the sinuses of Valsalva is mildly dilated.  __________  2D echo 07/08/2020 [facility]):  1. The left ventricle is normal in size with severely increased wall  thickness.    2. The left ventricular systolic function is mildly decreased, LVEF is  visually estimated at 45%.    3. There is grade III diastolic dysfunction (severely elevated filling  pressure).    4. The mitral valve leaflets are mildly thickened with normal leaflet  mobility.    5. Mitral annular calcification is present (mild).    6. There is mild to moderate mitral valve regurgitation.    7. The aortic valve is probably trileaflet with moderately thickened  leaflets with mildly reduced excursion.    8. There is moderate aortic regurgitation.    9. The left atrium is severely dilated in size.    10. The right ventricle is normal in size, with normal systolic function.    11. The right atrium is dilated  in size.    12. There is a small, posterior pericardial effusion.    13. IVC size and inspiratory change suggest elevated right atrial pressure.  (10-20 mmHg).  ___________  LHC 09/30/2019:  Prox RCA lesion is 20% stenosed. Prox RCA to Mid RCA lesion is 30% stenosed.   1.  Mildly calcified coronary arteries with mild nonobstructive coronary artery disease.  Difficult coronary angiography of the left system due to large coronary arteries with dilated aortic root.  Left coronary system was engaged with a JL 5 catheter. 2.  Left ventricular angiography was not performed.  EF was normal by echo.  Normal left ventricular end-diastolic pressure.  No significant gradient across the aortic valve.   Recommendations: The chest pain seems to be noncardiac.  Recommend aggressive medical therapy for nonobstructive coronary artery disease. No further recommendations from our standpoint and the patient can be discharged home later today after dialysis if no other issues. __________  2D echo  09/29/2019: 1. Left ventricular ejection fraction, by estimation, is 55 to 60%. The  left ventricle has normal function. The left ventricle has no regional  wall motion abnormalities. There is moderate left ventricular hypertrophy.  Left ventricular diastolic  parameters are consistent with Grade II diastolic dysfunction  (pseudonormalization).   2. Right ventricular systolic function is normal. The right ventricular  size is normal.   3. Left atrial size was mildly dilated.   4. The aortic valve was not well visualized, unable to exclude bicuspid  aortic valve.   5. There is dilatation of the aortic root, 4.5 cm.  __________  2D echo 08/20/2018: 1. The left ventricle has normal systolic function of 0000000. The cavity  size was normal. There is moderately increased left ventricular wall  thickness. Left ventricular diastolic parameters were normal.   2. The right ventricle has normal systolic function. The cavity was  mildly enlarged. There is no increase in right ventricular wall thickness.   3. Trivial pericardial effusion.   4. The mitral valve is normal in structure.   5. The tricuspid valve is normal in structure. Tricuspid valve  regurgitation is mild-moderate.   6. The aortic valve is normal in structure. Aortic valve regurgitation is  mild by color flow Doppler.  7. The pulmonic valve was normal in structure.  __________  2D echo 01/18/2016: - Procedure narrative: Transthoracic echocardiography. The study    was technically difficult, as a result of poor acoustic windows.  - Left ventricle: The cavity size was normal. There was moderate    concentric hypertrophy. Systolic function was normal. The    estimated ejection fraction was in the range of 60% to 65%.  - Aortic valve: Valve area (Vmax): 2.69 cm^2.  - Mitral valve: There was mild regurgitation.   Laboratory Data:  Chemistry Recent Labs  Lab 01/05/21 0234 01/05/21 1059  NA 138  --   K 3.8  --   CL 100  --    CO2 27  --   GLUCOSE 117*  --   BUN 31*  --   CREATININE 7.08* 7.31*  CALCIUM 8.2*  --   GFRNONAA 8* 8*  ANIONGAP 11  --     Recent Labs  Lab 01/05/21 0234  PROT 6.8  ALBUMIN 3.4*  AST 15  ALT 13  ALKPHOS 77  BILITOT 1.0   Hematology Recent Labs  Lab 01/05/21 0234  WBC 5.4  RBC 3.10*  HGB 9.1*  HCT 27.0*  MCV 87.1  MCH 29.4  MCHC 33.7  RDW 15.9*  PLT 100*   Cardiac EnzymesNo results for input(s): TROPONINI in the last 168 hours. No results for input(s): TROPIPOC in the last 168 hours.  BNP Recent Labs  Lab 01/05/21 0234  BNP 4,499.0*    DDimer No results for input(s): DDIMER in the last 168 hours.  Radiology/Studies:  DG Chest 2 View  Result Date: 01/05/2021 IMPRESSION: Cardiomegaly.  No active disease. Electronically Signed   By: Rolm Baptise M.D.   On: 01/05/2021 02:52   CT Chest Wo Contrast  Result Date: 01/05/2021 IMPRESSION: Extensive multi-vessel coronary artery calcification. Mild global cardiomegaly. Morphologic changes in keeping with pulmonary arterial hypertension. Minimal biapical centrilobular emphysema. Aortic Atherosclerosis (ICD10-I70.0) and Emphysema (ICD10-J43.9). Electronically Signed   By: Fidela Salisbury MD   On: 01/05/2021 06:32   US Venous Img Lower Unilateral Left (DVT)  Result Date: 01/05/2021 IMPRESSION: No evidence of deep venous thrombosis in the left lower extremity. Right common femoral vein also patent. Electronically Signed   By: Lowella Grip III M.D.   On: 01/05/2021 09:13    Assessment and Plan:   1.  Nonobstructive CAD with recurrent chest pain and minimally elevated troponin: -Currently chest pain-free following Dilaudid -Pain feels similar to what he experienced when he was diagnosed with a PE -Presentation appears to be similar to his hospital admissions at [facility] earlier this year -During admission to [facility] with similar symptoms, CTA chest was negative for PE in 10/2020 -Minimal troponin elevation that is flat  trending peaking at 73 and not consistent with ACS -Some degree of troponinemia in the setting of ESRD with volume overload  -Obtain echo -Plan for Lexiscan MPI on 6/29 -N.p.o. at midnight -Agree with urine drug screen, which is currently pending -From our perspective, patient can eat today  2. Persistent Afib: -He remains in A. fib with controlled ventricular response -Previously on Eliquis for history of recurrent PE and A. fib, though this was discontinued by nephrology (per patient) due to AV fistula bleeding -Further details of this bleeding would be beneficial to determine if this was clinically significant bleeding versus just persistent mild bleed from AV fistula -CHA2DS2-VASc at least 3 (CHF, HTN, vascular disease) -As an outpatient, [facility] had him on aspirin only -Will  discuss addition of heparin drip with MD, while admitted -If anticoagulation is reintroduced, he may need Coumadin  3.  History of recurrent PE: -Symptoms feel similar to his prior PEs -He has been off anticoagulation for at least 6 months -CTA at [facility] in 10/2020, for similar symptoms was negative for PE -Defer further evaluation to primary service   4.  HFmrEF: -Last echo from [facility] showed a preserved LV systolic function with an EF greater than 55% from 10/2020 -He does appear volume overloaded -He does make some urine PTA Bumex -Volume management largely per hemodialysis -PTA carvedilol, Imdur, hydralazine -Update echo  5.  ESRD: -History of nonadherence leading to volume overload and hospital admissions -HD per nephrology  6.  HTN: -Likely a component of hypertensive heart disease -Amlodipine, carvedilol, Imdur, Bumex, and hydralazine are ordered -HD -Escalate medical therapy as needed following administration of current antihypertensives  7.  Dilated aortic root: -Echo -Optimal BP control recommended -Outpatient follow-up   For questions or updates, please contact [place] Please consult  [email or web address] for contact info under Cardiology/STEMI.   Signed, Christell Faith, PA-C [address] Pager: [phone] 01/05/2021, 1:50 PM

## 2021-01-05 NOTE — ED Notes (Signed)
Per Nephrology NP, pt will get a short dialysis treatment today. Pt refusing dialysis until is able to get solid food. Unable to get a response back from cardiology at this point. Per Dr. Francine Graven, MD pt can eat at this time. Pt given bowl of cereal and milk. Dialysis nurse made aware.

## 2021-01-05 NOTE — ED Notes (Signed)
Pt requesting food at this time. Explained that Dr. Francine Graven should be NPO until seen by Cardiology. Pt verbalized understanding.

## 2021-01-05 NOTE — Progress Notes (Addendum)
[facility]  ROUNDING NOTE   Subjective:   [name] is a 65 y.o. male with previous medical conditions including CAD, PE on apixaban, dHF, anemia, chronic pain on opiates and cocaine use, and ESRD on dialysis. He presents to the ED with complaints of chest pain.   Patient is a currently received dialysis at [facility] followed by [facility]. He did receive a full treatment. He states this chest pain began yesterday and radiates to the back. He initially had shortness of breath and nausea, but not at this time. Currently on room air. Denies current chest pain. Labs on arrival are as follows: Na 138, K 3.8, BUN 31, Crt 7.08, and eGFR 8. Patient seen sitting on side of stretcher. States he is hungry. Says he stopped taking his blood thinners months ago due to delayed clotting after dialysis treatments. He had a successful treatment yesterday and states they were arranging for an extra treatment this week.   Objective:  Vital signs in last 24 hours:  Temp:  [97.9 F (36.6 C)] 97.9 F (36.6 C) (06/28 0234) Pulse Rate:  [77-97] 81 (06/28 1000) Resp:  [16-27] 16 (06/28 1000) BP: (144-174)/(86-119) 169/119 (06/28 1000) SpO2:  [92 %-100 %] 95 % (06/28 1000) Weight:  [115.2 kg] 115.2 kg (06/28 0231)  Weight change:  Filed Weights   01/05/21 0231  Weight: 115.2 kg    Intake/Output: No intake/output data recorded.   Intake/Output this shift:  No intake/output data recorded.  Physical Exam: General: NAD, sitting at edge of stretcher  Head: Normocephalic, atraumatic. Moist oral mucosal membranes  Eyes: Anicteric  Lungs:  Clear to auscultation  Heart: Irregular rate and rhythm  Abdomen:  Soft, nontender,   Extremities:  2+ peripheral edema.  Neurologic: Nonfocal, moving all four extremities  Skin: No lesions  Access: Lt AVF    Basic Metabolic Panel: Recent Labs  Lab 01/05/21 0234 01/05/21 1059  NA 138  --   K 3.8  --   CL 100  --   CO2 27  --   GLUCOSE  117*  --   BUN 31*  --   CREATININE 7.08* 7.31*  CALCIUM 8.2*  --     Liver Function Tests: Recent Labs  Lab 01/05/21 0234  AST 15  ALT 13  ALKPHOS 77  BILITOT 1.0  PROT 6.8  ALBUMIN 3.4*   No results for input(s): LIPASE, AMYLASE in the last 168 hours. No results for input(s): AMMONIA in the last 168 hours.  CBC: Recent Labs  Lab 01/05/21 0234  WBC 5.4  HGB 9.1*  HCT 27.0*  MCV 87.1  PLT 100*    Cardiac Enzymes: No results for input(s): CKTOTAL, CKMB, CKMBINDEX, TROPONINI in the last 168 hours.  BNP: Invalid input(s): POCBNP  CBG: No results for input(s): GLUCAP in the last 168 hours.  Microbiology: Results for orders placed or performed during the hospital encounter of 01/05/21  Resp Panel by RT-PCR (Flu A&B, Covid) Nasopharyngeal Swab     Status: None   Collection Time: 01/05/21  5:21 AM   Specimen: Nasopharyngeal Swab; Nasopharyngeal(NP) swabs in vial transport medium  Result Value Ref Range Status   SARS Coronavirus 2 by RT PCR NEGATIVE NEGATIVE Final    Comment: (NOTE) SARS-CoV-2 target nucleic acids are NOT DETECTED.  The SARS-CoV-2 RNA is generally detectable in upper respiratory specimens during the acute phase of infection. The lowest concentration of SARS-CoV-2 viral copies this assay can detect is 138 copies/mL. A negative result  does not preclude SARS-Cov-2 infection and should not be used as the sole basis for treatment or other patient management decisions. A negative result may occur with  improper specimen collection/handling, submission of specimen other than nasopharyngeal swab, presence of viral mutation(s) within the areas targeted by this assay, and inadequate number of viral copies(<138 copies/mL). A negative result must be combined with clinical observations, patient history, and epidemiological information. The expected result is Negative.  Fact Sheet for Patients:  EntrepreneurPulse.com.au  Fact Sheet for  Healthcare Providers:  IncredibleEmployment.be  This test is no t yet approved or cleared by the [place] FDA and  has been authorized for detection and/or diagnosis of SARS-CoV-2 by FDA under an Emergency Use Authorization (EUA). This EUA will remain  in effect (meaning this test can be used) for the duration of the COVID-19 declaration under Section 564(b)(1) of the Act, 21 U.S.C.section 360bbb-3(b)(1), unless the authorization is terminated  or revoked sooner.       Influenza A by PCR NEGATIVE NEGATIVE Final   Influenza B by PCR NEGATIVE NEGATIVE Final    Comment: (NOTE) The Xpert Xpress SARS-CoV-2/FLU/RSV plus assay is intended as an aid in the diagnosis of influenza from Nasopharyngeal swab specimens and should not be used as a sole basis for treatment. Nasal washings and aspirates are unacceptable for Xpert Xpress SARS-CoV-2/FLU/RSV testing.  Fact Sheet for Patients: EntrepreneurPulse.com.au  Fact Sheet for Healthcare Providers: IncredibleEmployment.be  This test is not yet approved or cleared by the [place] FDA and has been authorized for detection and/or diagnosis of SARS-CoV-2 by FDA under an Emergency Use Authorization (EUA). This EUA will remain in effect (meaning this test can be used) for the duration of the COVID-19 declaration under Section 564(b)(1) of the Act, 21 U.S.C. section 360bbb-3(b)(1), unless the authorization is terminated or revoked.  Performed at [facility], [address] [place], [place] [address]     Coagulation Studies: No results for input(s): LABPROT, INR in the last 72 hours.  Urinalysis: No results for input(s): COLORURINE, LABSPEC, PHURINE, GLUCOSEU, HGBUR, BILIRUBINUR, KETONESUR, PROTEINUR, UROBILINOGEN, NITRITE, LEUKOCYTESUR in the last 72 hours.  Invalid input(s): APPERANCEUR    Imaging: DG Chest 2 View  Result Date: 01/05/2021 CLINICAL DATA:  Short  shortness of breath EXAM: CHEST - 2 VIEW COMPARISON:  09/13/2020 FINDINGS: Cardiomegaly. No confluent opacities, effusions or edema. No acute bony abnormality. IMPRESSION: Cardiomegaly.  No active disease. Electronically Signed   By: Rolm Baptise M.D.   On: 01/05/2021 02:52   CT Chest Wo Contrast  Result Date: 01/05/2021 CLINICAL DATA:  Chest pain, end-stage renal disease on hemodialysis, atrial fibrillation EXAM: CT CHEST WITHOUT CONTRAST TECHNIQUE: Multidetector CT imaging of the chest was performed following the standard protocol without IV contrast. COMPARISON:  01/02/2020 FINDINGS: Cardiovascular: There is extensive multi-vessel coronary artery calcification present, similar to that noted on prior examination. Mild global cardiomegaly. Trace pericardial fluid. The central pulmonary arteries are enlarged in keeping with changes of pulmonary artery hypertension. Mild atherosclerotic calcification is seen within the thoracic aorta. No aortic aneurysm. Mediastinum/Nodes: The visualized thyroid is unremarkable. No pathologic thoracic adenopathy. The esophagus is unremarkable. Lungs/Pleura: Minimal biapical centrilobular emphysema. Lungs are otherwise clear. No pleural effusion or pneumothorax. The central airways are widely patent. Upper Abdomen: The kidneys are atrophic in keeping with the given history of chronic renal failure. No acute abnormality within the visualized upper abdomen. Musculoskeletal: No acute bone abnormality. No lytic or blastic bone lesion. IMPRESSION: Extensive multi-vessel coronary artery calcification. Mild  global cardiomegaly. Morphologic changes in keeping with pulmonary arterial hypertension. Minimal biapical centrilobular emphysema. Aortic Atherosclerosis (ICD10-I70.0) and Emphysema (ICD10-J43.9). Electronically Signed   By: Fidela Salisbury MD   On: 01/05/2021 06:32   US Venous Img Lower Unilateral Left (DVT)  Result Date: 01/05/2021 CLINICAL DATA:  Lower extremity pain and edema  EXAM: LEFT LOWER EXTREMITY VENOUS DUPLEX ULTRASOUND TECHNIQUE: Gray-scale sonography with graded compression, as well as color Doppler and duplex ultrasound were performed to evaluate the left lower extremity deep venous system from the level of the common femoral vein and including the common femoral, femoral, profunda femoral, popliteal and calf veins including the posterior tibial, peroneal and gastrocnemius veins when visible. The superficial great saphenous vein was also interrogated. Spectral Doppler was utilized to evaluate flow at rest and with distal augmentation maneuvers in the common femoral, femoral and popliteal veins. COMPARISON:  February 19, 2014 FINDINGS: Contralateral Common Femoral Vein: Respiratory phasicity is normal and symmetric with the symptomatic side. No evidence of thrombus. Normal compressibility. Common Femoral Vein: No evidence of thrombus. Normal compressibility, respiratory phasicity and response to augmentation. Saphenofemoral Junction: No evidence of thrombus. Normal compressibility and flow on color Doppler imaging. Profunda Femoral Vein: No evidence of thrombus. Normal compressibility and flow on color Doppler imaging. Femoral Vein: No evidence of thrombus. Normal compressibility, respiratory phasicity and response to augmentation. Popliteal Vein: No evidence of thrombus. Normal compressibility, respiratory phasicity and response to augmentation. Calf Veins: No evidence of thrombus. Normal compressibility and flow on color Doppler imaging. Superficial Great Saphenous Vein: No evidence of thrombus. Normal compressibility. Venous Reflux:  None. Other Findings:  None. IMPRESSION: No evidence of deep venous thrombosis in the left lower extremity. Right common femoral vein also patent. Electronically Signed   By: Lowella Grip III M.D.   On: 01/05/2021 09:13     Medications:    sodium chloride     sodium chloride      amLODipine  10 mg Oral Daily   [START ON 01/06/2021]  aspirin EC  81 mg Oral Daily   bumetanide  1 mg Oral Once per day on Sun Tue Thu Sat   And   bumetanide  1 mg Oral Once per day on Sun Tue Thu Sat   carvedilol  6.25 mg Oral BID   [START ON 01/06/2021] Chlorhexidine Gluconate Cloth  6 each Topical Q0600   ferric citrate  420 mg Oral TID WC   heparin injection (subcutaneous)  5,000 Units Subcutaneous Q8H   hydrALAZINE  100 mg Oral BID   isosorbide mononitrate  60 mg Oral Daily   pantoprazole  40 mg Oral Daily   polyethylene glycol  17 g Oral Daily   sevelamer carbonate  1,600 mg Oral TID WC   tamsulosin  0.4 mg Oral QPC supper   sodium chloride, sodium chloride, acetaminophen, albuterol, alteplase, heparin, HYDROmorphone (DILAUDID) injection, lidocaine (PF), lidocaine-prilocaine, nitroGLYCERIN, ondansetron (ZOFRAN) IV, pentafluoroprop-tetrafluoroeth, senna  Assessment/ Plan:  Mr. OLEGARIO EMBERSON is a 65 y.o.  male with previous medical conditions including CAD, PE on apixaban, dHF, anemia, chronic pain on opiates and cocaine use, and ESRD on dialysis. He presents to the ED with complaints of chest pain  [facility]/MWF/Lt AVF/113.5kg  Hypertension, 169/119 Home regimen includes Amlodipine, hydralazine, isosorbide, and irbesartan Bumetanide on non dialysis days.  Irbesartan held Carvedilol 6.25 po BID  2 End stage renal disease on dialysis Received full treatment yesterday Will receive short treatment today, UF goal 1.5L Next treatment scheduled for tomorrow  after cardiac cath   3 Anemia of chronic kidney disease Lab Results  Component Value Date   HGB 9.1 (L) 01/05/2021  Hgb below goal  4  Secondary Hyperparathyroidism: with outpatient labs: phosphorus 7.6, calcium 8.5 on 12/21/20.   Lab Results  Component Value Date   PTH 758 (H) 08/15/2018   CALCIUM 8.2 (L) 01/05/2021   PHOS 7.8 (H) 09/30/2019  Phosphorus elevated, Auryxia ordered outpatient      LOS: 0 Tacoya Altizer 6/28/20221:39 PM

## 2021-01-05 NOTE — ED Notes (Signed)
US at bedside

## 2021-01-05 NOTE — Progress Notes (Signed)
Avf deaccessed, arterial and venous sites held for 5 min each, no bleeding noted, dressingsin place.

## 2021-01-05 NOTE — Progress Notes (Signed)
Majority of admission profile complete in ED

## 2021-01-05 NOTE — ED Notes (Signed)
Dinner meal tray given at this time.  

## 2021-01-05 NOTE — Progress Notes (Signed)
Pt states he will only dialize for 2 hours, machine setting adjusted accordingly.

## 2021-01-05 NOTE — ED Notes (Signed)
Patient aware that we need urine sample for testing, unable at this time. Pt given instruction on providing urine sample when able to do so.   

## 2021-01-05 NOTE — ED Notes (Signed)
Not able to take a BP  At this time, left arm restrict, patient has a needed PIV in the RUE that is needed for a CTA

## 2021-01-05 NOTE — ED Notes (Signed)
Pt moved to room 11 for dialysis at this time.

## 2021-01-05 NOTE — Progress Notes (Signed)
Nicholas green, np notified pt will onlyh complete 2 hours.

## 2021-01-05 NOTE — Progress Notes (Signed)
Dr. Candiss Norse notified of pt's chest pain and stopping dialysis early.

## 2021-01-05 NOTE — Progress Notes (Signed)
Pt states will do 2 hours of dialysis instead of 2.5 hours as ordered.

## 2021-01-05 NOTE — ED Notes (Signed)
Patient transported to CT 

## 2021-01-05 NOTE — Progress Notes (Signed)
*  PRELIMINARY RESULTS* Echocardiogram 2D Echocardiogram has been performed.  [name] 01/05/2021, 1:39 PM

## 2021-01-06 ENCOUNTER — Other Ambulatory Visit: Payer: Self-pay

## 2021-01-06 ENCOUNTER — Observation Stay (HOSPITAL_BASED_OUTPATIENT_CLINIC_OR_DEPARTMENT_OTHER): Payer: Medicare Other

## 2021-01-06 DIAGNOSIS — I480 Paroxysmal atrial fibrillation: Secondary | ICD-10-CM | POA: Diagnosis not present

## 2021-01-06 DIAGNOSIS — I2510 Atherosclerotic heart disease of native coronary artery without angina pectoris: Secondary | ICD-10-CM

## 2021-01-06 DIAGNOSIS — I4819 Other persistent atrial fibrillation: Secondary | ICD-10-CM | POA: Diagnosis present

## 2021-01-06 DIAGNOSIS — F17210 Nicotine dependence, cigarettes, uncomplicated: Secondary | ICD-10-CM | POA: Diagnosis present

## 2021-01-06 DIAGNOSIS — E669 Obesity, unspecified: Secondary | ICD-10-CM | POA: Diagnosis present

## 2021-01-06 DIAGNOSIS — I5020 Unspecified systolic (congestive) heart failure: Secondary | ICD-10-CM | POA: Diagnosis not present

## 2021-01-06 DIAGNOSIS — Z79891 Long term (current) use of opiate analgesic: Secondary | ICD-10-CM | POA: Diagnosis not present

## 2021-01-06 DIAGNOSIS — R072 Precordial pain: Principal | ICD-10-CM

## 2021-01-06 DIAGNOSIS — Z86711 Personal history of pulmonary embolism: Secondary | ICD-10-CM | POA: Diagnosis not present

## 2021-01-06 DIAGNOSIS — N2581 Secondary hyperparathyroidism of renal origin: Secondary | ICD-10-CM | POA: Diagnosis present

## 2021-01-06 DIAGNOSIS — I5032 Chronic diastolic (congestive) heart failure: Secondary | ICD-10-CM | POA: Diagnosis not present

## 2021-01-06 DIAGNOSIS — Z992 Dependence on renal dialysis: Secondary | ICD-10-CM | POA: Diagnosis not present

## 2021-01-06 DIAGNOSIS — L97829 Non-pressure chronic ulcer of other part of left lower leg with unspecified severity: Secondary | ICD-10-CM | POA: Diagnosis present

## 2021-01-06 DIAGNOSIS — D6959 Other secondary thrombocytopenia: Secondary | ICD-10-CM | POA: Diagnosis present

## 2021-01-06 DIAGNOSIS — I4891 Unspecified atrial fibrillation: Secondary | ICD-10-CM | POA: Diagnosis not present

## 2021-01-06 DIAGNOSIS — I428 Other cardiomyopathies: Secondary | ICD-10-CM | POA: Diagnosis present

## 2021-01-06 DIAGNOSIS — D696 Thrombocytopenia, unspecified: Secondary | ICD-10-CM

## 2021-01-06 DIAGNOSIS — Z7982 Long term (current) use of aspirin: Secondary | ICD-10-CM | POA: Diagnosis not present

## 2021-01-06 DIAGNOSIS — I25118 Atherosclerotic heart disease of native coronary artery with other forms of angina pectoris: Secondary | ICD-10-CM | POA: Diagnosis not present

## 2021-01-06 DIAGNOSIS — I482 Chronic atrial fibrillation, unspecified: Secondary | ICD-10-CM | POA: Diagnosis not present

## 2021-01-06 DIAGNOSIS — Z8249 Family history of ischemic heart disease and other diseases of the circulatory system: Secondary | ICD-10-CM | POA: Diagnosis not present

## 2021-01-06 DIAGNOSIS — D631 Anemia in chronic kidney disease: Secondary | ICD-10-CM | POA: Diagnosis present

## 2021-01-06 DIAGNOSIS — Z6834 Body mass index (BMI) 34.0-34.9, adult: Secondary | ICD-10-CM | POA: Diagnosis not present

## 2021-01-06 DIAGNOSIS — Z79899 Other long term (current) drug therapy: Secondary | ICD-10-CM | POA: Diagnosis not present

## 2021-01-06 DIAGNOSIS — I5042 Chronic combined systolic (congestive) and diastolic (congestive) heart failure: Secondary | ICD-10-CM | POA: Diagnosis present

## 2021-01-06 DIAGNOSIS — B1920 Unspecified viral hepatitis C without hepatic coma: Secondary | ICD-10-CM | POA: Diagnosis present

## 2021-01-06 DIAGNOSIS — N186 End stage renal disease: Secondary | ICD-10-CM | POA: Diagnosis present

## 2021-01-06 DIAGNOSIS — R0789 Other chest pain: Secondary | ICD-10-CM

## 2021-01-06 DIAGNOSIS — Z96642 Presence of left artificial hip joint: Secondary | ICD-10-CM | POA: Diagnosis present

## 2021-01-06 DIAGNOSIS — I132 Hypertensive heart and chronic kidney disease with heart failure and with stage 5 chronic kidney disease, or end stage renal disease: Secondary | ICD-10-CM | POA: Diagnosis present

## 2021-01-06 DIAGNOSIS — I77810 Thoracic aortic ectasia: Secondary | ICD-10-CM | POA: Diagnosis present

## 2021-01-06 DIAGNOSIS — Z20822 Contact with and (suspected) exposure to covid-19: Secondary | ICD-10-CM | POA: Diagnosis present

## 2021-01-06 LAB — NM MYOCAR MULTI W/SPECT W/WALL MOTION / EF
LV dias vol: 197 mL (ref 62–150)
LV sys vol: 120 mL
Peak HR: 89 {beats}/min
Percent HR: 56 %
Rest HR: 86 {beats}/min
SDS: 4
SRS: 15
SSS: 6
TID: 1.07

## 2021-01-06 LAB — BASIC METABOLIC PANEL
Anion gap: 12 (ref 5–15)
BUN: 36 mg/dL — ABNORMAL HIGH (ref 8–23)
CO2: 26 mmol/L (ref 22–32)
Calcium: 8.8 mg/dL — ABNORMAL LOW (ref 8.9–10.3)
Chloride: 100 mmol/L (ref 98–111)
Creatinine, Ser: 7.24 mg/dL — ABNORMAL HIGH (ref 0.61–1.24)
GFR, Estimated: 8 mL/min — ABNORMAL LOW (ref >60)
Glucose, Bld: 99 mg/dL (ref 70–99)
Potassium: 4.4 mmol/L (ref 3.5–5.1)
Sodium: 138 mmol/L (ref 135–145)

## 2021-01-06 LAB — PROTIME-INR
INR: 1.3 — ABNORMAL HIGH (ref 0.8–1.2)
Prothrombin Time: 15.7 seconds — ABNORMAL HIGH (ref 11.4–15.2)

## 2021-01-06 LAB — CBC
HCT: 27 % — ABNORMAL LOW (ref 39.0–52.0)
Hemoglobin: 9.1 g/dL — ABNORMAL LOW (ref 13.0–17.0)
MCH: 29.3 pg (ref 26.0–34.0)
MCHC: 33.7 g/dL (ref 30.0–36.0)
MCV: 86.8 fL (ref 80.0–100.0)
Platelets: 100 10*3/uL — ABNORMAL LOW (ref 150–400)
RBC: 3.11 MIL/uL — ABNORMAL LOW (ref 4.22–5.81)
RDW: 15.9 % — ABNORMAL HIGH (ref 11.5–15.5)
WBC: 6.5 10*3/uL (ref 4.0–10.5)
nRBC: 0 % (ref 0.0–0.2)

## 2021-01-06 LAB — HEPARIN LEVEL (UNFRACTIONATED): Heparin Unfractionated: <0.1 IU/mL — ABNORMAL LOW (ref 0.30–0.70)

## 2021-01-06 LAB — APTT: aPTT: 35 seconds (ref 24–36)

## 2021-01-06 MED ORDER — HEPARIN BOLUS VIA INFUSION
3000.0000 [IU] | Freq: Once | INTRAVENOUS | Status: AC
  Administered 2021-01-06: 3000 [IU] via INTRAVENOUS
  Filled 2021-01-06: qty 3000

## 2021-01-06 MED ORDER — TECHNETIUM TC 99M TETROFOSMIN IV KIT
10.9200 | PACK | Freq: Once | INTRAVENOUS | Status: AC | PRN
  Administered 2021-01-06: 10.92 via INTRAVENOUS

## 2021-01-06 MED ORDER — HEPARIN BOLUS VIA INFUSION
5000.0000 [IU] | Freq: Once | INTRAVENOUS | Status: AC
  Administered 2021-01-06: 5000 [IU] via INTRAVENOUS
  Filled 2021-01-06: qty 5000

## 2021-01-06 MED ORDER — HEPARIN (PORCINE) 25000 UT/250ML-% IV SOLN
2100.0000 [IU]/h | INTRAVENOUS | Status: DC
  Administered 2021-01-06: 1500 [IU]/h via INTRAVENOUS
  Administered 2021-01-07: 1800 [IU]/h via INTRAVENOUS
  Filled 2021-01-06 (×2): qty 250

## 2021-01-06 MED ORDER — ATORVASTATIN CALCIUM 20 MG PO TABS
40.0000 mg | ORAL_TABLET | Freq: Every day | ORAL | Status: DC
  Administered 2021-01-06: 40 mg via ORAL
  Filled 2021-01-06: qty 2

## 2021-01-06 MED ORDER — REGADENOSON 0.4 MG/5ML IV SOLN
0.4000 mg | Freq: Once | INTRAVENOUS | Status: AC
  Administered 2021-01-06: 0.4 mg via INTRAVENOUS
  Filled 2021-01-06: qty 5

## 2021-01-06 MED ORDER — DIPHENHYDRAMINE HCL 50 MG/ML IJ SOLN: 25.0000 mg | Freq: Once | INTRAMUSCULAR | Status: DC

## 2021-01-06 MED ORDER — TECHNETIUM TC 99M TETROFOSMIN IV KIT
30.4200 | PACK | Freq: Once | INTRAVENOUS | Status: AC | PRN
  Administered 2021-01-06: 30.42 via INTRAVENOUS

## 2021-01-06 NOTE — Progress Notes (Addendum)
[facility]  ROUNDING NOTE   Subjective:   [name] is a 65 y.o. male with previous medical conditions including CAD, PE on apixaban, dHF, anemia, chronic pain on opiates and cocaine use, and ESRD on dialysis. He presents to the ED with complaints of chest pain.   Patient is a currently received dialysis at [facility] clinic followed by [facility]. He did receive a full treatment. He states this chest pain began yesterday and radiates to the back. He initially had shortness of breath and nausea, but not at this time. Currently on room air. Denies current chest pain. Labs on arrival are as follows: Na 138, K 3.8, BUN 31, Crt 7.08, and eGFR 8.   Patient seen resting in bed  Currently NPO for procedure Denies chest pain  Objective:  Vital signs in last 24 hours:  Temp:  [97.7 F (36.5 C)-98.5 F (36.9 C)] 97.7 F (36.5 C) (06/29 1210) Pulse Rate:  [70-111] 71 (06/29 1210) Resp:  [16-24] 18 (06/28 2337) BP: (141-185)/(78-114) 153/78 (06/29 1210) SpO2:  [91 %-100 %] 98 % (06/29 1210)  Weight change:  Filed Weights   01/05/21 0231  Weight: 115.2 kg    Intake/Output: I/O last 3 completed shifts: In: -  Out: 1301 [Other:1301]   Intake/Output this shift:  Total I/O In: -  Out: 175 [Urine:175]  Physical Exam: General: NAD, laying in bed  Head: Normocephalic, atraumatic. Moist oral mucosal membranes  Eyes: Anicteric  Lungs:  Clear to auscultation  Heart: Irregular rate and rhythm  Abdomen:  Soft, nontender  Extremities:  2+ peripheral edema.  Neurologic: Nonfocal, moving all four extremities  Skin: No lesions  Access: Lt AVF    Basic Metabolic Panel: Recent Labs  Lab 01/05/21 0234 01/05/21 1059 01/06/21 0713  NA 138  --  138  K 3.8  --  4.4  CL 100  --  100  CO2 27  --  26  GLUCOSE 117*  --  99  BUN 31*  --  36*  CREATININE 7.08* 7.31* 7.24*  CALCIUM 8.2*  --  8.8*     Liver Function Tests: Recent Labs  Lab 01/05/21 0234  AST 15  ALT  13  ALKPHOS 77  BILITOT 1.0  PROT 6.8  ALBUMIN 3.4*    No results for input(s): LIPASE, AMYLASE in the last 168 hours. No results for input(s): AMMONIA in the last 168 hours.  CBC: Recent Labs  Lab 01/05/21 0234 01/06/21 0713  WBC 5.4 6.5  HGB 9.1* 9.1*  HCT 27.0* 27.0*  MCV 87.1 86.8  PLT 100* 100*     Cardiac Enzymes: No results for input(s): CKTOTAL, CKMB, CKMBINDEX, TROPONINI in the last 168 hours.  BNP: Invalid input(s): POCBNP  CBG: No results for input(s): GLUCAP in the last 168 hours.  Microbiology: Results for orders placed or performed during the hospital encounter of 01/05/21  Resp Panel by RT-PCR (Flu A&B, Covid) Nasopharyngeal Swab     Status: None   Collection Time: 01/05/21  5:21 AM   Specimen: Nasopharyngeal Swab; Nasopharyngeal(NP) swabs in vial transport medium  Result Value Ref Range Status   SARS Coronavirus 2 by RT PCR NEGATIVE NEGATIVE Final    Comment: (NOTE) SARS-CoV-2 target nucleic acids are NOT DETECTED.  The SARS-CoV-2 RNA is generally detectable in upper respiratory specimens during the acute phase of infection. The lowest concentration of SARS-CoV-2 viral copies this assay can detect is 138 copies/mL. A negative result does not preclude SARS-Cov-2 infection and should  not be used as the sole basis for treatment or other patient management decisions. A negative result may occur with  improper specimen collection/handling, submission of specimen other than nasopharyngeal swab, presence of viral mutation(s) within the areas targeted by this assay, and inadequate number of viral copies(<138 copies/mL). A negative result must be combined with clinical observations, patient history, and epidemiological information. The expected result is Negative.  Fact Sheet for Patients:  EntrepreneurPulse.com.au  Fact Sheet for Healthcare Providers:  IncredibleEmployment.be  This test is no t yet approved or  cleared by the [place] FDA and  has been authorized for detection and/or diagnosis of SARS-CoV-2 by FDA under an Emergency Use Authorization (EUA). This EUA will remain  in effect (meaning this test can be used) for the duration of the COVID-19 declaration under Section 564(b)(1) of the Act, 21 U.S.C.section 360bbb-3(b)(1), unless the authorization is terminated  or revoked sooner.       Influenza A by PCR NEGATIVE NEGATIVE Final   Influenza B by PCR NEGATIVE NEGATIVE Final    Comment: (NOTE) The Xpert Xpress SARS-CoV-2/FLU/RSV plus assay is intended as an aid in the diagnosis of influenza from Nasopharyngeal swab specimens and should not be used as a sole basis for treatment. Nasal washings and aspirates are unacceptable for Xpert Xpress SARS-CoV-2/FLU/RSV testing.  Fact Sheet for Patients: EntrepreneurPulse.com.au  Fact Sheet for Healthcare Providers: IncredibleEmployment.be  This test is not yet approved or cleared by the [place] FDA and has been authorized for detection and/or diagnosis of SARS-CoV-2 by FDA under an Emergency Use Authorization (EUA). This EUA will remain in effect (meaning this test can be used) for the duration of the COVID-19 declaration under Section 564(b)(1) of the Act, 21 U.S.C. section 360bbb-3(b)(1), unless the authorization is terminated or revoked.  Performed at [facility], [address], [place], [place] [address]   Aerobic/Anaerobic Culture w Gram Stain (surgical/deep wound)     Status: None (Preliminary result)   Collection Time: 01/05/21  5:21 AM   Specimen: Leg; Wound  Result Value Ref Range Status   Specimen Description   Final    LEG LEFT Performed at [facility], [address], [place], [place] [address]    Special Requests   Final    Normal Performed at [facility] [address] [place], [place] [address]    Gram Stain   Final    RARE WBC  PRESENT, PREDOMINANTLY PMN MODERATE GRAM POSITIVE COCCI IN PAIRS IN CLUSTERS    Culture   Final    FEW GRAM NEGATIVE RODS CULTURE REINCUBATED FOR BETTER GROWTH Performed at [facility], [place] [address] [place], [place] [address]    Report Status PENDING  Incomplete    Coagulation Studies: Recent Labs    01/06/21 0929  LABPROT 15.7*  INR 1.3*    Urinalysis: No results for input(s): COLORURINE, LABSPEC, PHURINE, GLUCOSEU, HGBUR, BILIRUBINUR, KETONESUR, PROTEINUR, UROBILINOGEN, NITRITE, LEUKOCYTESUR in the last 72 hours.  Invalid input(s): APPERANCEUR    Imaging: DG Chest 2 View  Result Date: 01/05/2021 CLINICAL DATA:  Short shortness of breath EXAM: CHEST - 2 VIEW COMPARISON:  09/13/2020 FINDINGS: Cardiomegaly. No confluent opacities, effusions or edema. No acute bony abnormality. IMPRESSION: Cardiomegaly.  No active disease. Electronically Signed   By: Rolm Baptise M.D.   On: 01/05/2021 02:52   CT Chest Wo Contrast  Result Date: 01/05/2021 CLINICAL DATA:  Chest pain, end-stage renal disease on hemodialysis, atrial fibrillation EXAM: CT CHEST WITHOUT CONTRAST  TECHNIQUE: Multidetector CT imaging of the chest was performed following the standard protocol without IV contrast. COMPARISON:  01/02/2020 FINDINGS: Cardiovascular: There is extensive multi-vessel coronary artery calcification present, similar to that noted on prior examination. Mild global cardiomegaly. Trace pericardial fluid. The central pulmonary arteries are enlarged in keeping with changes of pulmonary artery hypertension. Mild atherosclerotic calcification is seen within the thoracic aorta. No aortic aneurysm. Mediastinum/Nodes: The visualized thyroid is unremarkable. No pathologic thoracic adenopathy. The esophagus is unremarkable. Lungs/Pleura: Minimal biapical centrilobular emphysema. Lungs are otherwise clear. No pleural effusion or pneumothorax. The central airways are widely patent. Upper Abdomen: The kidneys  are atrophic in keeping with the given history of chronic renal failure. No acute abnormality within the visualized upper abdomen. Musculoskeletal: No acute bone abnormality. No lytic or blastic bone lesion. IMPRESSION: Extensive multi-vessel coronary artery calcification. Mild global cardiomegaly. Morphologic changes in keeping with pulmonary arterial hypertension. Minimal biapical centrilobular emphysema. Aortic Atherosclerosis (ICD10-I70.0) and Emphysema (ICD10-J43.9). Electronically Signed   By: Fidela Salisbury MD   On: 01/05/2021 06:32   US Venous Img Lower Unilateral Left (DVT)  Result Date: 01/05/2021 CLINICAL DATA:  Lower extremity pain and edema EXAM: LEFT LOWER EXTREMITY VENOUS DUPLEX ULTRASOUND TECHNIQUE: Gray-scale sonography with graded compression, as well as color Doppler and duplex ultrasound were performed to evaluate the left lower extremity deep venous system from the level of the common femoral vein and including the common femoral, femoral, profunda femoral, popliteal and calf veins including the posterior tibial, peroneal and gastrocnemius veins when visible. The superficial great saphenous vein was also interrogated. Spectral Doppler was utilized to evaluate flow at rest and with distal augmentation maneuvers in the common femoral, femoral and popliteal veins. COMPARISON:  February 19, 2014 FINDINGS: Contralateral Common Femoral Vein: Respiratory phasicity is normal and symmetric with the symptomatic side. No evidence of thrombus. Normal compressibility. Common Femoral Vein: No evidence of thrombus. Normal compressibility, respiratory phasicity and response to augmentation. Saphenofemoral Junction: No evidence of thrombus. Normal compressibility and flow on color Doppler imaging. Profunda Femoral Vein: No evidence of thrombus. Normal compressibility and flow on color Doppler imaging. Femoral Vein: No evidence of thrombus. Normal compressibility, respiratory phasicity and response to  augmentation. Popliteal Vein: No evidence of thrombus. Normal compressibility, respiratory phasicity and response to augmentation. Calf Veins: No evidence of thrombus. Normal compressibility and flow on color Doppler imaging. Superficial Great Saphenous Vein: No evidence of thrombus. Normal compressibility. Venous Reflux:  None. Other Findings:  None. IMPRESSION: No evidence of deep venous thrombosis in the left lower extremity. Right common femoral vein also patent. Electronically Signed   By: Lowella Grip III M.D.   On: 01/05/2021 09:13   ECHOCARDIOGRAM COMPLETE  Result Date: 01/05/2021    ECHOCARDIOGRAM REPORT   Patient Name:   MASOUD NYCE Date of Exam: 01/05/2021 Medical Rec #:  [id number]         Height:       72.0 in Accession #:    [id number]        Weight:       254.0 lb Date of Birth:  [birth year]         BSA:          2.358 m Patient Age:    50 years          BP:           169/119 mmHg Patient Gender: M                 HR:  94 bpm. Exam Location:  [facility] Procedure: 2D Echo, Color Doppler, Cardiac Doppler and Strain Analysis Indications:     R07.9 Chest Pain  History:         Patient has prior history of Echocardiogram examinations, most                  recent 09/29/2019. Non-obstructive CAD, ESRD; Risk                  Factors:Hypertension.  Sonographer:     Charmayne Sheer RDCS (AE) Referring Phys:  [id number] Rise Mu Diagnosing Phys: Nelva Bush MD  Sonographer Comments: Suboptimal subcostal window. Global longitudinal strain was attempted. IMPRESSIONS  1. Left ventricular ejection fraction, by estimation, is 40 to 45%. The left ventricle has mildly decreased function. The left ventricle demonstrates global hypokinesis. The left ventricular internal cavity size was mildly dilated. There is mild left ventricular hypertrophy. Left ventricular diastolic parameters are indeterminate. The average left ventricular global longitudinal strain is -8.9 %. The global longitudinal strain is  abnormal.  2. Right ventricular systolic function is moderately reduced. The right ventricular size is moderately enlarged.  3. Left atrial size was moderately dilated.  4. Right atrial size was mildly dilated.  5. A small pericardial effusion is present. The pericardial effusion is posterior to the left ventricle.  6. The mitral valve is degenerative. Mild mitral valve regurgitation.  7. The aortic valve has an indeterminant number of cusps. There is mild calcification of the aortic valve. There is severe thickening of the aortic valve. Aortic valve regurgitation is mild. Mild to moderate aortic valve sclerosis/calcification is present, without any evidence of aortic stenosis.  8. Aortic dilatation noted. There is moderate dilatation of the aortic root, measuring 47 mm. There is moderate-severe dilatation of the ascending aorta, measuring 49 mm.  9. The inferior vena cava is dilated in size with <50% respiratory variability, suggesting right atrial pressure of 15 mmHg. FINDINGS  Left Ventricle: Left ventricular ejection fraction, by estimation, is 40 to 45%. The left ventricle has mildly decreased function. The left ventricle demonstrates global hypokinesis. The average left ventricular global longitudinal strain is -8.9 %. The  global longitudinal strain is abnormal. The left ventricular internal cavity size was mildly dilated. There is mild left ventricular hypertrophy. Left ventricular diastolic parameters are indeterminate. Right Ventricle: The right ventricular size is moderately enlarged. No increase in right ventricular wall thickness. Right ventricular systolic function is moderately reduced. Left Atrium: Left atrial size was moderately dilated. Right Atrium: Right atrial size was mildly dilated. Pericardium: A small pericardial effusion is present. The pericardial effusion is posterior to the left ventricle. Mitral Valve: The mitral valve is degenerative in appearance. There is mild thickening of the  mitral valve leaflet(s). There is mild calcification of the mitral valve leaflet(s). Mild mitral valve regurgitation. MV peak gradient, 5.1 mmHg. The mean mitral valve gradient is 2.0 mmHg. Tricuspid Valve: The tricuspid valve is normal in structure. Tricuspid valve regurgitation is trivial. Aortic Valve: The aortic valve has an indeterminant number of cusps. There is mild calcification of the aortic valve. There is severe thickening of the aortic valve. Aortic valve regurgitation is mild. Mild to moderate aortic valve sclerosis/calcification is present, without any evidence of aortic stenosis. Aortic valve mean gradient measures 9.0 mmHg. Aortic valve peak gradient measures 15.2 mmHg. Aortic valve area, by VTI measures 3.01 cm. Pulmonic Valve: The pulmonic valve was not well visualized. Pulmonic valve regurgitation is not visualized. No evidence of pulmonic  stenosis. Aorta: Aortic dilatation noted. There is moderate dilatation of the aortic root, measuring 47 mm. There is moderate-severe dilatation of the ascending aorta, measuring 49 mm. Pulmonary Artery: The pulmonary artery is not well seen. Venous: The inferior vena cava is dilated in size with less than 50% respiratory variability, suggesting right atrial pressure of 15 mmHg. IAS/Shunts: The interatrial septum was not well visualized.  LEFT VENTRICLE PLAX 2D LVIDd:         5.70 cm  Diastology LVIDs:         4.50 cm  LV e' medial:    5.66 cm/s LV PW:         1.30 cm  LV E/e' medial:  17.4 LV IVS:        1.20 cm  LV e' lateral:   7.07 cm/s LVOT diam:     2.60 cm  LV E/e' lateral: 14.0 LV SV:         102 LV SV Index:   43       2D Longitudinal Strain LVOT Area:     5.31 cm 2D Strain GLS Avg:     -8.9 %  RIGHT VENTRICLE RV Basal diam:  4.20 cm LEFT ATRIUM            Index       RIGHT ATRIUM           Index LA diam:      5.10 cm  2.16 cm/m  RA Area:     23.80 cm LA Vol (A2C): 102.0 ml 43.25 ml/m RA Volume:   73.80 ml  31.29 ml/m LA Vol (A4C): 93.2 ml  39.52  ml/m  AORTIC VALVE                    PULMONIC VALVE AV Area (Vmax):    2.78 cm     PV Vmax:       0.96 m/s AV Area (Vmean):   2.59 cm     PV Vmean:      66.500 cm/s AV Area (VTI):     3.01 cm     PV VTI:        0.165 m AV Vmax:           195.00 cm/s  PV Peak grad:  3.7 mmHg AV Vmean:          142.000 cm/s PV Mean grad:  2.0 mmHg AV VTI:            0.339 m AV Peak Grad:      15.2 mmHg AV Mean Grad:      9.0 mmHg LVOT Vmax:         102.00 cm/s LVOT Vmean:        69.200 cm/s LVOT VTI:          0.192 m LVOT/AV VTI ratio: 0.57  AORTA Ao Root diam: 4.70 cm MITRAL VALVE MV Area (PHT): 3.90 cm    SHUNTS MV Area VTI:   4.51 cm    Systemic VTI:  0.19 m MV Peak grad:  5.1 mmHg    Systemic Diam: 2.60 cm MV Mean grad:  2.0 mmHg MV Vmax:       1.13 m/s MV Vmean:      72.5 cm/s MV Decel Time: 194 msec MV E velocity: 98.77 cm/s Nelva Bush MD Electronically signed by Nelva Bush MD Signature Date/Time: 01/05/2021/4:52:40 PM    Final    CT Angio Chest/Abd/Pel for Dissection W and/or W/WO  Result Date: 01/05/2021 CLINICAL DATA:  65 year old male with chest and back pain. Concern for aortic dissection. EXAM: CT ANGIOGRAPHY CHEST, ABDOMEN AND PELVIS TECHNIQUE: Non-contrast CT of the chest was initially obtained. Multidetector CT imaging through the chest, abdomen and pelvis was performed using the standard protocol during bolus administration of intravenous contrast. Multiplanar reconstructed images and MIPs were obtained and reviewed to evaluate the vascular anatomy. CONTRAST:  135m OMNIPAQUE IOHEXOL 350 MG/ML SOLN COMPARISON:  Chest CT dated 01/05/2021 and CT abdomen pelvis dated 01/02/2020. FINDINGS: Evaluation is limited due to respiratory motion artifact as well as streak artifact caused by patient's arms and left hip arthroplasty. CTA CHEST FINDINGS Cardiovascular: There is mild cardiomegaly. There is a small pericardial effusion measuring 11 mm in thickness. Three vessel coronary vascular calcification.  Moderate atherosclerotic calcification of the thoracic aorta. No aneurysmal dilatation or dissection. The origins of the great vessels of the aortic arch appear patent as visualized. Evaluation of the pulmonary arteries is very limited due to respiratory motion artifact and suboptimal opacification and timing of the contrast. No central pulmonary artery embolus identified. Mediastinum/Nodes: There is no hilar or mediastinal adenopathy. The esophagus is grossly unremarkable. No mediastinal fluid collection. Lungs/Pleura: Mild centrilobular emphysema. The lungs are clear. There is no pleural effusion or pneumothorax. The central airways patent. Musculoskeletal: Osteopenia with degenerative changes of the spine. No acute osseous pathology. Review of the MIP images confirms the above findings. CTA ABDOMEN AND PELVIS FINDINGS VASCULAR Aorta: Advanced aortoiliac atherosclerotic disease. No dissection. 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 Follow-up as per recommendation of prior CT. No periaortic fluid collection. Celiac: The celiac axis and its major branches are patent. SMA: Atherosclerotic calcification of the origin of the SMA. The SMA remains patent. Renals: Atherosclerotic calcification of the renal arteries. The renal arteries are patent. IMA: Patent without evidence of aneurysm, dissection, vasculitis or significant stenosis. Inflow: Patent without evidence of aneurysm, dissection, vasculitis or significant stenosis. Veins: No obvious venous abnormality within the limitations of this arterial phase study. Review of the MIP images confirms the above findings. NON-VASCULAR No intra-abdominal free air. Trace free fluid in the pelvis. Hepatobiliary: Slight irregularity of the liver contour may represent early changes of cirrhosis. Clinical correlation is recommended. No intrahepatic biliary dilatation. The gallbladder is unremarkable. Pancreas: Unremarkable. No pancreatic ductal dilatation or surrounding  inflammatory changes. Spleen: Normal in size without focal abnormality. Adrenals/Urinary Tract: The adrenal glands unremarkable. Atrophic kidneys. Similar appearance of nodular soft tissue in the interpolar right kidney which may represent renal parenchyma. A 12 mm hypodense lesion from the superior pole of the left kidney is not characterized on this CT. There is no hydronephrosis on either side. The urinary bladder is grossly unremarkable. Stomach/Bowel: There is sigmoid diverticulosis without active inflammatory changes. There is no bowel obstruction or active inflammation. The appendix is not visualized with certainty. No inflammatory changes identified in the right lower quadrant. Lymphatic: No adenopathy. Reproductive: The prostate gland is poorly visualized. Other: There is diffuse subcutaneous edema and anasarca. Musculoskeletal: Osteopenia with degenerative changes of the spine. Total left hip arthroplasty. No acute osseous pathology. Review of the MIP images confirms the above findings. IMPRESSION: 1. No acute intrathoracic, abdominal, or pelvic pathology. No CT evidence of aortic dissection. 2. 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 Follow-up as per recommendation of prior CT. 3. Mild cardiomegaly with a small pericardial effusion. 4. Aortic Atherosclerosis (ICD10-I70.0) and Emphysema (ICD10-J43.9). Electronically Signed   By: ALaren EvertsD.  On: 01/05/2021 19:59     Medications:    sodium chloride     sodium chloride     heparin 1,500 Units/hr (01/06/21 1220)    amLODipine  10 mg Oral Daily   aspirin EC  81 mg Oral Daily   bumetanide  1 mg Oral Once per day on Sun Tue Thu Sat   And   bumetanide  1 mg Oral Once per day on Sun Tue Thu Sat   carvedilol  6.25 mg Oral BID   Chlorhexidine Gluconate Cloth  6 each Topical Q0600   ferric citrate  420 mg Oral TID WC   hydrALAZINE  100 mg Oral TID   isosorbide mononitrate  60 mg Oral Daily   pantoprazole  40 mg Oral Daily    polyethylene glycol  17 g Oral Daily   sevelamer carbonate  1,600 mg Oral TID WC   tamsulosin  0.4 mg Oral QPC supper   sodium chloride, sodium chloride, acetaminophen, albuterol, alteplase, heparin, HYDROmorphone (DILAUDID) injection, lidocaine (PF), lidocaine-prilocaine, nitroGLYCERIN, ondansetron (ZOFRAN) IV, pentafluoroprop-tetrafluoroeth, senna  Assessment/ Plan:  Mr. SHAHEEN MENDE is a 65 y.o.  male with previous medical conditions including CAD, PE on apixaban, dHF, anemia, chronic pain on opiates and cocaine use, and ESRD on dialysis. He presents to the ED with complaints of chest pain  [facility]/MWF/Lt AVF/113.5kg  Hypertension, 153/78 Home regimen includes Amlodipine, hydralazine, isosorbide, and irbesartan Bumetanide on non dialysis days.  Irbesartan held Carvedilol 6.25 po BID Improved BP control  2 End stage renal disease on dialysis Received full treatment yesterday Received treatment yesterday, stopped early due to complaints of chest pain. UF of 1.3L achieved. Will receive dialysis today after cardiac cath Next treatment on Friday, if remains inpatient   3 Anemia of chronic kidney disease Lab Results  Component Value Date   HGB 9.1 (L) 01/06/2021  Hgb below goal Will monitor  4  Secondary Hyperparathyroidism: with outpatient labs: phosphorus 7.6, calcium 8.5 on 12/21/20.   Lab Results  Component Value Date   PTH 758 (H) 08/15/2018   CALCIUM 8.8 (L) 01/06/2021   PHOS 7.8 (H) 09/30/2019  Phosphorus elevated, Auryxia ordered outpatient      LOS: 0 Dene Landsberg 6/29/20221:33 PM

## 2021-01-06 NOTE — Progress Notes (Signed)
HD initiated at 1445, cannulated using 15gauge needle without difficulty.  L AVF +TB. Tolerated HD treatment and UF goal 1.5L achieved.  Treatment ended at 1745. Patient offers no complaints, report given to primary RN and transported back to room in stable condition

## 2021-01-06 NOTE — Consult Note (Signed)
ANTICOAGULATION CONSULT NOTE - Follow Up Consult  Pharmacy Consult for Heparin gtt Indication: atrial fibrillation  Allergies  Allergen Reactions   Cyclobenzaprine Nausea Only   Clonidine Other (See Comments)    Asymptomatic bradycardia to 38   Furosemide Other (See Comments)    Caused renal failure   Tylenol [Acetaminophen] Other (See Comments)    Reaction:  Pt states it bothers his liver    Patient Measurements: Height: 6' (182.9 cm) Weight: 115.2 kg (254 lb) IBW/kg (Calculated) : 77.6 Heparin Dosing Weight: 102.5kg  Vital Signs: Temp: 98.1 F (36.7 C) (06/29 0759) Temp Source: Oral (06/29 0759) BP: 145/87 (06/29 0759) Pulse Rate: 82 (06/29 0759)  Labs: Recent Labs    01/05/21 0234 01/05/21 0521 01/05/21 1059 01/05/21 1612 01/06/21 0713  HGB 9.1*  --   --   --  9.1*  HCT 27.0*  --   --   --  27.0*  PLT 100*  --   --   --  100*  CREATININE 7.08*  --  7.31*  --  7.24*  TROPONINIHS 70* 73* 69* 70*  --     Estimated Creatinine Clearance: 13.9 mL/min (A) (by C-G formula based on SCr of 7.24 mg/dL (H)).   Medications: No AC/APT pertinent allergies. PTA: No AC (stopped eliquis d/t bleeding and no recent fills; pt reports not taking PTA). Pt takes '81mg'$  ASA QD. 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 Negative w/u for DVT/PE, trops low. Pharmacy consulted for mgmt of Heparin gtt in Afib.  Date Time aPTT/HL Rate/Comment       Baseline Labs: aPTT - pending INR - pending Hgb - 9.1 Plts - 100  Goal of Therapy:  Heparin level 0.3-0.7 units/ml Monitor platelets by anticoagulation protocol: Yes   Plan:   Give 5000 units bolus x 1  Start heparin infusion at 1500 units/hr Check anti-Xa level in 8 hours and daily while on heparin Continue to monitor  H&H and platelets  [name] [name] 01/06/2021,9:11 AM

## 2021-01-06 NOTE — Consult Note (Addendum)
Real for Heparin gtt Indication: atrial fibrillation  Patient Measurements: Heparin Dosing Weight: 102.5kg  Labs: Recent Labs    01/05/21 0234 01/05/21 0521 01/05/21 1059 01/05/21 1612 01/06/21 0713 01/06/21 0929 01/06/21 1903  HGB 9.1*  --   --   --  9.1*  --   --   HCT 27.0*  --   --   --  27.0*  --   --   PLT 100*  --   --   --  100*  --   --   APTT  --   --   --   --   --  35  --   LABPROT  --   --   --   --   --  15.7*  --   INR  --   --   --   --   --  1.3*  --   HEPARINUNFRC  --   --   --   --   --   --  <0.10*  CREATININE 7.08*  --  7.31*  --  7.24*  --   --   TROPONINIHS 70* 73* 69* 70*  --   --   --      Estimated Creatinine Clearance: 13.9 mL/min (A) (by C-G formula based on SCr of 7.24 mg/dL (H)).   Medications: No AC/APT pertinent allergies. PTA: No AC (stopped eliquis d/t bleeding and no recent fills; pt reports not taking PTA). Pt takes '81mg'$  ASA QD. 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 Negative w/u for DVT/PE, trops low. Pharmacy consulted for mgmt of Heparin gtt in Afib.  Date Time aPTT/HL Rate/Comment 6/29 1903 ----/<0.10 1500 units/hr; subthera  Baseline Labs: aPTT - 35 INR - 1.3 Hgb - 9.1 Plts - 100  Goal of Therapy:  Heparin level 0.3-0.7 units/ml Monitor platelets by anticoagulation protocol: Yes   Plan:  --Heparin level is subtherapeutic. Discussed with RN, not aware of any interruptions in infusion or issues with administration --Heparin 3000 unit IV bolus and increase infusion rate to 1800 units/hr --Re-check HL 8 hours after rate change --Daily CBC per protocol while on IV heparin   Benita Gutter 01/06/2021,9:28 PM

## 2021-01-06 NOTE — Progress Notes (Addendum)
PROGRESS NOTE    [name]  [id number] DOB: 11-07-1955 DOA: 01/05/2021 PCP: Patient, No Pcp Per (Inactive)  Assessment & Plan:   Principal Problem:   Chest pain Active Problems:   HEPATITIS C   ESRD (end stage renal disease) on dialysis (HCC)   Thrombocytopenia (HCC)   Chronic diastolic CHF (congestive heart failure) (HCC)   CAD (coronary artery disease)   AF (paroxysmal atrial fibrillation) (HCC)    Chest pain: etiology unclear. Hx of CAD. Recent left heart cath showing nonobstructive CAD with 30% stenosis mid RCA, 20% proximal RCA. Continue carvedilol, imdur, statin and aspirin. Cardiac stress test today as per cardio   History of substance abuse: pt denies recent use of cocaine. Drug screen was positive for opiates only    History of pulmonary embolism: no longer on anticoagulation with apixaban due to bleeding from his AV fistula.   ESRD: on HD MWF. Nephro following   ACD: likely secondary to ESRD. No need for a transfusion currently    Chronic diastolic CHF: not acutely exacerbated. Continue bumex, carvedilol, hydralazine and imdur   Persistent a. fib: continue on carvedilol, IV heparin drip. Cardio following and recs apprec  HTN: continue amlodipine, carvedilol, hydralazine and imdur    Thrombocytopenia: unclear etiology, possible secondary to portal hypertension from known hepatitis C  Obesity: BMI 34.4. Complicates overall care and prognosis    DVT prophylaxis: heparin  Code Status: full  Family Communication:  Disposition Plan: depends on PT/OT recs (not consulted yet)  Level of care: Progressive Cardiac  Status is: Inpatient  Remains inpatient appropriate because:Ongoing diagnostic testing needed not appropriate for outpatient work up, Unsafe d/c plan, IV treatments appropriate due to intensity of illness or inability to take PO, and Inpatient level of care appropriate due to severity of illness  Dispo: The patient is from: Home               Anticipated d/c is to: Home              Patient currently is not medically stable to d/c.   Difficult to place patient : unclear          Consultants:  Cardio Nephro   Procedures:   Antimicrobials:   Subjective: Pt c/o fatigue    Objective: Vitals:   01/05/21 1928 01/05/21 2119 01/05/21 2337 01/06/21 0759  BP:  (!) 141/82 (!) 157/89 (!) 145/87  Pulse: 85 90 79 82  Resp: '16 20 18   '$ Temp:   98.5 F (36.9 C) 98.1 F (36.7 C)  TempSrc:   Oral Oral  SpO2: 96% 94% 100% 100%  Weight:      Height:        Intake/Output Summary (Last 24 hours) at 01/06/2021 0842 Last data filed at 01/05/2021 1530 Gross per 24 hour  Intake --  Output 1301 ml  Net -1301 ml   Filed Weights   01/05/21 0231  Weight: 115.2 kg    Examination:  General exam: Appears calm and comfortable  Respiratory system: Clear to auscultation. Respiratory effort normal. Cardiovascular system: S1 & S2+. No  rubs, gallops or clicks.  Gastrointestinal system: Abdomen is nondistended, soft and nontender.  Normal bowel sounds heard. Central nervous system: Alert and oriented. Moves all extremities  Psychiatry: Judgement and insight appear normal. Mood & affect appropriate.     Data Reviewed: I have personally reviewed following labs and imaging studies  CBC: Recent Labs  Lab 01/05/21 0234 01/06/21 0713  WBC 5.4 6.5  HGB 9.1* 9.1*  HCT 27.0* 27.0*  MCV 87.1 86.8  PLT 100* 123XX123*   Basic Metabolic Panel: Recent Labs  Lab 01/05/21 0234 01/05/21 1059 01/06/21 0713  NA 138  --  138  K 3.8  --  4.4  CL 100  --  100  CO2 27  --  26  GLUCOSE 117*  --  99  BUN 31*  --  36*  CREATININE 7.08* 7.31* 7.24*  CALCIUM 8.2*  --  8.8*   GFR: Estimated Creatinine Clearance: 13.9 mL/min (A) (by C-G formula based on SCr of 7.24 mg/dL (H)). Liver Function Tests: Recent Labs  Lab 01/05/21 0234  AST 15  ALT 13  ALKPHOS 77  BILITOT 1.0  PROT 6.8  ALBUMIN 3.4*   No results for input(s): LIPASE,  AMYLASE in the last 168 hours. No results for input(s): AMMONIA in the last 168 hours. Coagulation Profile: No results for input(s): INR, PROTIME in the last 168 hours. Cardiac Enzymes: No results for input(s): CKTOTAL, CKMB, CKMBINDEX, TROPONINI in the last 168 hours. BNP (last 3 results) No results for input(s): PROBNP in the last 8760 hours. HbA1C: No results for input(s): HGBA1C in the last 72 hours. CBG: No results for input(s): GLUCAP in the last 168 hours. Lipid Profile: No results for input(s): CHOL, HDL, LDLCALC, TRIG, CHOLHDL, LDLDIRECT in the last 72 hours. Thyroid Function Tests: No results for input(s): TSH, T4TOTAL, FREET4, T3FREE, THYROIDAB in the last 72 hours. Anemia Panel: No results for input(s): VITAMINB12, FOLATE, FERRITIN, TIBC, IRON, RETICCTPCT in the last 72 hours. Sepsis Labs: No results for input(s): PROCALCITON, LATICACIDVEN in the last 168 hours.  Recent Results (from the past 240 hour(s))  Resp Panel by RT-PCR (Flu A&B, Covid) Nasopharyngeal Swab     Status: None   Collection Time: 01/05/21  5:21 AM   Specimen: Nasopharyngeal Swab; Nasopharyngeal(NP) swabs in vial transport medium  Result Value Ref Range Status   SARS Coronavirus 2 by RT PCR NEGATIVE NEGATIVE Final    Comment: (NOTE) SARS-CoV-2 target nucleic acids are NOT DETECTED.  The SARS-CoV-2 RNA is generally detectable in upper respiratory specimens during the acute phase of infection. The lowest concentration of SARS-CoV-2 viral copies this assay can detect is 138 copies/mL. A negative result does not preclude SARS-Cov-2 infection and should not be used as the sole basis for treatment or other patient management decisions. A negative result may occur with  improper specimen collection/handling, submission of specimen other than nasopharyngeal swab, presence of viral mutation(s) within the areas targeted by this assay, and inadequate number of viral copies(<138 copies/mL). A negative result  must be combined with clinical observations, patient history, and epidemiological information. The expected result is Negative.  Fact Sheet for Patients:  EntrepreneurPulse.com.au  Fact Sheet for Healthcare Providers:  IncredibleEmployment.be  This test is no t yet approved or cleared by the [place] FDA and  has been authorized for detection and/or diagnosis of SARS-CoV-2 by FDA under an Emergency Use Authorization (EUA). This EUA will remain  in effect (meaning this test can be used) for the duration of the COVID-19 declaration under Section 564(b)(1) of the Act, 21 U.S.C.section 360bbb-3(b)(1), unless the authorization is terminated  or revoked sooner.       Influenza A by PCR NEGATIVE NEGATIVE Final   Influenza B by PCR NEGATIVE NEGATIVE Final    Comment: (NOTE) The Xpert Xpress SARS-CoV-2/FLU/RSV plus assay is intended as an aid in the diagnosis of influenza from Nasopharyngeal swab specimens and should  not be used as a sole basis for treatment. Nasal washings and aspirates are unacceptable for Xpert Xpress SARS-CoV-2/FLU/RSV testing.  Fact Sheet for Patients: EntrepreneurPulse.com.au  Fact Sheet for Healthcare Providers: IncredibleEmployment.be  This test is not yet approved or cleared by the [place] FDA and has been authorized for detection and/or diagnosis of SARS-CoV-2 by FDA under an Emergency Use Authorization (EUA). This EUA will remain in effect (meaning this test can be used) for the duration of the COVID-19 declaration under Section 564(b)(1) of the Act, 21 U.S.C. section 360bbb-3(b)(1), unless the authorization is terminated or revoked.  Performed at [facility], [address] [place], [place] [address]   Aerobic/Anaerobic Culture w Gram Stain (surgical/deep wound)     Status: None (Preliminary result)   Collection Time: 01/05/21  5:21 AM   Specimen: Leg; Wound   Result Value Ref Range Status   Specimen Description   Final    LEG LEFT Performed at [facility], [address], [place], [place] [address]    Special Requests   Final    Normal Performed at [facility], [address] [place], [place] [address]    Gram Stain   Final    RARE WBC PRESENT, PREDOMINANTLY PMN MODERATE GRAM POSITIVE COCCI IN PAIRS IN CLUSTERS Performed at [facility], [place] [address] [place], [place] [address]    Culture PENDING  Incomplete   Report Status PENDING  Incomplete         Radiology Studies: DG Chest 2 View  Result Date: 01/05/2021 CLINICAL DATA:  Short shortness of breath EXAM: CHEST - 2 VIEW COMPARISON:  09/13/2020 FINDINGS: Cardiomegaly. No confluent opacities, effusions or edema. No acute bony abnormality. IMPRESSION: Cardiomegaly.  No active disease. Electronically Signed   By: Rolm Baptise M.D.   On: 01/05/2021 02:52   CT Chest Wo Contrast  Result Date: 01/05/2021 CLINICAL DATA:  Chest pain, end-stage renal disease on hemodialysis, atrial fibrillation EXAM: CT CHEST WITHOUT CONTRAST TECHNIQUE: Multidetector CT imaging of the chest was performed following the standard protocol without IV contrast. COMPARISON:  01/02/2020 FINDINGS: Cardiovascular: There is extensive multi-vessel coronary artery calcification present, similar to that noted on prior examination. Mild global cardiomegaly. Trace pericardial fluid. The central pulmonary arteries are enlarged in keeping with changes of pulmonary artery hypertension. Mild atherosclerotic calcification is seen within the thoracic aorta. No aortic aneurysm. Mediastinum/Nodes: The visualized thyroid is unremarkable. No pathologic thoracic adenopathy. The esophagus is unremarkable. Lungs/Pleura: Minimal biapical centrilobular emphysema. Lungs are otherwise clear. No pleural effusion or pneumothorax. The central airways are widely patent. Upper Abdomen: The kidneys are atrophic in keeping  with the given history of chronic renal failure. No acute abnormality within the visualized upper abdomen. Musculoskeletal: No acute bone abnormality. No lytic or blastic bone lesion. IMPRESSION: Extensive multi-vessel coronary artery calcification. Mild global cardiomegaly. Morphologic changes in keeping with pulmonary arterial hypertension. Minimal biapical centrilobular emphysema. Aortic Atherosclerosis (ICD10-I70.0) and Emphysema (ICD10-J43.9). Electronically Signed   By: Fidela Salisbury MD   On: 01/05/2021 06:32   US Venous Img Lower Unilateral Left (DVT)  Result Date: 01/05/2021 CLINICAL DATA:  Lower extremity pain and edema EXAM: LEFT LOWER EXTREMITY VENOUS DUPLEX ULTRASOUND TECHNIQUE: Gray-scale sonography with graded compression, as well as color Doppler and duplex ultrasound were performed to evaluate the left lower extremity deep venous system from the level of the common femoral vein and including the common femoral, femoral, profunda femoral, popliteal and calf veins including the posterior tibial, peroneal and  gastrocnemius veins when visible. The superficial great saphenous vein was also interrogated. Spectral Doppler was utilized to evaluate flow at rest and with distal augmentation maneuvers in the common femoral, femoral and popliteal veins. COMPARISON:  February 19, 2014 FINDINGS: Contralateral Common Femoral Vein: Respiratory phasicity is normal and symmetric with the symptomatic side. No evidence of thrombus. Normal compressibility. Common Femoral Vein: No evidence of thrombus. Normal compressibility, respiratory phasicity and response to augmentation. Saphenofemoral Junction: No evidence of thrombus. Normal compressibility and flow on color Doppler imaging. Profunda Femoral Vein: No evidence of thrombus. Normal compressibility and flow on color Doppler imaging. Femoral Vein: No evidence of thrombus. Normal compressibility, respiratory phasicity and response to augmentation. Popliteal Vein: No  evidence of thrombus. Normal compressibility, respiratory phasicity and response to augmentation. Calf Veins: No evidence of thrombus. Normal compressibility and flow on color Doppler imaging. Superficial Great Saphenous Vein: No evidence of thrombus. Normal compressibility. Venous Reflux:  None. Other Findings:  None. IMPRESSION: No evidence of deep venous thrombosis in the left lower extremity. Right common femoral vein also patent. Electronically Signed   By: Lowella Grip III M.D.   On: 01/05/2021 09:13   ECHOCARDIOGRAM COMPLETE  Result Date: 01/05/2021    ECHOCARDIOGRAM REPORT   Patient Name:   PER NOTARO Date of Exam: 01/05/2021 Medical Rec #:  [id number]         Height:       72.0 in Accession #:    [id number]        Weight:       254.0 lb Date of Birth:  1955-07-12         BSA:          2.358 m Patient Age:    65 years          BP:           169/119 mmHg Patient Gender: M                 HR:           94 bpm. Exam Location:  [facility] Procedure: 2D Echo, Color Doppler, Cardiac Doppler and Strain Analysis Indications:     R07.9 Chest Pain  History:         Patient has prior history of Echocardiogram examinations, most                  recent 09/29/2019. Non-obstructive CAD, ESRD; Risk                  Factors:Hypertension.  Sonographer:     Charmayne Sheer RDCS (AE) Referring Phys:  [id number] Rise Mu Diagnosing Phys: Nelva Bush MD  Sonographer Comments: Suboptimal subcostal window. Global longitudinal strain was attempted. IMPRESSIONS  1. Left ventricular ejection fraction, by estimation, is 40 to 45%. The left ventricle has mildly decreased function. The left ventricle demonstrates global hypokinesis. The left ventricular internal cavity size was mildly dilated. There is mild left ventricular hypertrophy. Left ventricular diastolic parameters are indeterminate. The average left ventricular global longitudinal strain is -8.9 %. The global longitudinal strain is abnormal.  2. Right ventricular systolic  function is moderately reduced. The right ventricular size is moderately enlarged.  3. Left atrial size was moderately dilated.  4. Right atrial size was mildly dilated.  5. A small pericardial effusion is present. The pericardial effusion is posterior to the left ventricle.  6. The mitral valve is degenerative. Mild mitral valve regurgitation.  7. The aortic valve has an indeterminant  number of cusps. There is mild calcification of the aortic valve. There is severe thickening of the aortic valve. Aortic valve regurgitation is mild. Mild to moderate aortic valve sclerosis/calcification is present, without any evidence of aortic stenosis.  8. Aortic dilatation noted. There is moderate dilatation of the aortic root, measuring 47 mm. There is moderate-severe dilatation of the ascending aorta, measuring 49 mm.  9. The inferior vena cava is dilated in size with <50% respiratory variability, suggesting right atrial pressure of 15 mmHg. FINDINGS  Left Ventricle: Left ventricular ejection fraction, by estimation, is 40 to 45%. The left ventricle has mildly decreased function. The left ventricle demonstrates global hypokinesis. The average left ventricular global longitudinal strain is -8.9 %. The  global longitudinal strain is abnormal. The left ventricular internal cavity size was mildly dilated. There is mild left ventricular hypertrophy. Left ventricular diastolic parameters are indeterminate. Right Ventricle: The right ventricular size is moderately enlarged. No increase in right ventricular wall thickness. Right ventricular systolic function is moderately reduced. Left Atrium: Left atrial size was moderately dilated. Right Atrium: Right atrial size was mildly dilated. Pericardium: A small pericardial effusion is present. The pericardial effusion is posterior to the left ventricle. Mitral Valve: The mitral valve is degenerative in appearance. There is mild thickening of the mitral valve leaflet(s). There is mild  calcification of the mitral valve leaflet(s). Mild mitral valve regurgitation. MV peak gradient, 5.1 mmHg. The mean mitral valve gradient is 2.0 mmHg. Tricuspid Valve: The tricuspid valve is normal in structure. Tricuspid valve regurgitation is trivial. Aortic Valve: The aortic valve has an indeterminant number of cusps. There is mild calcification of the aortic valve. There is severe thickening of the aortic valve. Aortic valve regurgitation is mild. Mild to moderate aortic valve sclerosis/calcification is present, without any evidence of aortic stenosis. Aortic valve mean gradient measures 9.0 mmHg. Aortic valve peak gradient measures 15.2 mmHg. Aortic valve area, by VTI measures 3.01 cm. Pulmonic Valve: The pulmonic valve was not well visualized. Pulmonic valve regurgitation is not visualized. No evidence of pulmonic stenosis. Aorta: Aortic dilatation noted. There is moderate dilatation of the aortic root, measuring 47 mm. There is moderate-severe dilatation of the ascending aorta, measuring 49 mm. Pulmonary Artery: The pulmonary artery is not well seen. Venous: The inferior vena cava is dilated in size with less than 50% respiratory variability, suggesting right atrial pressure of 15 mmHg. IAS/Shunts: The interatrial septum was not well visualized.  LEFT VENTRICLE PLAX 2D LVIDd:         5.70 cm  Diastology LVIDs:         4.50 cm  LV e' medial:    5.66 cm/s LV PW:         1.30 cm  LV E/e' medial:  17.4 LV IVS:        1.20 cm  LV e' lateral:   7.07 cm/s LVOT diam:     2.60 cm  LV E/e' lateral: 14.0 LV SV:         102 LV SV Index:   43       2D Longitudinal Strain LVOT Area:     5.31 cm 2D Strain GLS Avg:     -8.9 %  RIGHT VENTRICLE RV Basal diam:  4.20 cm LEFT ATRIUM            Index       RIGHT ATRIUM           Index LA diam:      5.10  cm  2.16 cm/m  RA Area:     23.80 cm LA Vol (A2C): 102.0 ml 43.25 ml/m RA Volume:   73.80 ml  31.29 ml/m LA Vol (A4C): 93.2 ml  39.52 ml/m  AORTIC VALVE                     PULMONIC VALVE AV Area (Vmax):    2.78 cm     PV Vmax:       0.96 m/s AV Area (Vmean):   2.59 cm     PV Vmean:      66.500 cm/s AV Area (VTI):     3.01 cm     PV VTI:        0.165 m AV Vmax:           195.00 cm/s  PV Peak grad:  3.7 mmHg AV Vmean:          142.000 cm/s PV Mean grad:  2.0 mmHg AV VTI:            0.339 m AV Peak Grad:      15.2 mmHg AV Mean Grad:      9.0 mmHg LVOT Vmax:         102.00 cm/s LVOT Vmean:        69.200 cm/s LVOT VTI:          0.192 m LVOT/AV VTI ratio: 0.57  AORTA Ao Root diam: 4.70 cm MITRAL VALVE MV Area (PHT): 3.90 cm    SHUNTS MV Area VTI:   4.51 cm    Systemic VTI:  0.19 m MV Peak grad:  5.1 mmHg    Systemic Diam: 2.60 cm MV Mean grad:  2.0 mmHg MV Vmax:       1.13 m/s MV Vmean:      72.5 cm/s MV Decel Time: 194 msec MV E velocity: 98.77 cm/s Nelva Bush MD Electronically signed by Nelva Bush MD Signature Date/Time: 01/05/2021/4:52:40 PM    Final    CT Angio Chest/Abd/Pel for Dissection W and/or W/WO  Result Date: 01/05/2021 CLINICAL DATA:  65 year old male with chest and back pain. Concern for aortic dissection. EXAM: CT ANGIOGRAPHY CHEST, ABDOMEN AND PELVIS TECHNIQUE: Non-contrast CT of the chest was initially obtained. Multidetector CT imaging through the chest, abdomen and pelvis was performed using the standard protocol during bolus administration of intravenous contrast. Multiplanar reconstructed images and MIPs were obtained and reviewed to evaluate the vascular anatomy. CONTRAST:  180m OMNIPAQUE IOHEXOL 350 MG/ML SOLN COMPARISON:  Chest CT dated 01/05/2021 and CT abdomen pelvis dated 01/02/2020. FINDINGS: Evaluation is limited due to respiratory motion artifact as well as streak artifact caused by patient's arms and left hip arthroplasty. CTA CHEST FINDINGS Cardiovascular: There is mild cardiomegaly. There is a small pericardial effusion measuring 11 mm in thickness. Three vessel coronary vascular calcification. Moderate atherosclerotic calcification of the  thoracic aorta. No aneurysmal dilatation or dissection. The origins of the great vessels of the aortic arch appear patent as visualized. Evaluation of the pulmonary arteries is very limited due to respiratory motion artifact and suboptimal opacification and timing of the contrast. No central pulmonary artery embolus identified. Mediastinum/Nodes: There is no hilar or mediastinal adenopathy. The esophagus is grossly unremarkable. No mediastinal fluid collection. Lungs/Pleura: Mild centrilobular emphysema. The lungs are clear. There is no pleural effusion or pneumothorax. The central airways patent. Musculoskeletal: Osteopenia with degenerative changes of the spine. No acute osseous pathology. Review of the MIP images confirms the above findings. CTA ABDOMEN  AND PELVIS FINDINGS VASCULAR Aorta: Advanced aortoiliac atherosclerotic disease. No dissection. 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 Follow-up as per recommendation of prior CT. No periaortic fluid collection. Celiac: The celiac axis and its major branches are patent. SMA: Atherosclerotic calcification of the origin of the SMA. The SMA remains patent. Renals: Atherosclerotic calcification of the renal arteries. The renal arteries are patent. IMA: Patent without evidence of aneurysm, dissection, vasculitis or significant stenosis. Inflow: Patent without evidence of aneurysm, dissection, vasculitis or significant stenosis. Veins: No obvious venous abnormality within the limitations of this arterial phase study. Review of the MIP images confirms the above findings. NON-VASCULAR No intra-abdominal free air. Trace free fluid in the pelvis. Hepatobiliary: Slight irregularity of the liver contour may represent early changes of cirrhosis. Clinical correlation is recommended. No intrahepatic biliary dilatation. The gallbladder is unremarkable. Pancreas: Unremarkable. No pancreatic ductal dilatation or surrounding inflammatory changes. Spleen: Normal in size  without focal abnormality. Adrenals/Urinary Tract: The adrenal glands unremarkable. Atrophic kidneys. Similar appearance of nodular soft tissue in the interpolar right kidney which may represent renal parenchyma. A 12 mm hypodense lesion from the superior pole of the left kidney is not characterized on this CT. There is no hydronephrosis on either side. The urinary bladder is grossly unremarkable. Stomach/Bowel: There is sigmoid diverticulosis without active inflammatory changes. There is no bowel obstruction or active inflammation. The appendix is not visualized with certainty. No inflammatory changes identified in the right lower quadrant. Lymphatic: No adenopathy. Reproductive: The prostate gland is poorly visualized. Other: There is diffuse subcutaneous edema and anasarca. Musculoskeletal: Osteopenia with degenerative changes of the spine. Total left hip arthroplasty. No acute osseous pathology. Review of the MIP images confirms the above findings. IMPRESSION: 1. No acute intrathoracic, abdominal, or pelvic pathology. No CT evidence of aortic dissection. 2. 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 Follow-up as per recommendation of prior CT. 3. Mild cardiomegaly with a small pericardial effusion. 4. Aortic Atherosclerosis (ICD10-I70.0) and Emphysema (ICD10-J43.9). Electronically Signed   By: Anner Crete M.D.   On: 01/05/2021 19:59        Scheduled Meds:  amLODipine  10 mg Oral Daily   aspirin EC  81 mg Oral Daily   bumetanide  1 mg Oral Once per day on Sun Tue Thu Sat   And   bumetanide  1 mg Oral Once per day on Sun Tue Thu Sat   carvedilol  6.25 mg Oral BID   Chlorhexidine Gluconate Cloth  6 each Topical Q0600   ferric citrate  420 mg Oral TID WC   heparin injection (subcutaneous)  5,000 Units Subcutaneous Q8H   hydrALAZINE  100 mg Oral TID   isosorbide mononitrate  60 mg Oral Daily   pantoprazole  40 mg Oral Daily   polyethylene glycol  17 g Oral Daily   sevelamer carbonate  1,600  mg Oral TID WC   tamsulosin  0.4 mg Oral QPC supper   Continuous Infusions:  sodium chloride     sodium chloride       LOS: 0 days    Time spent: 31 mins     Wyvonnia Dusky, MD [facility] Pager [phone]  If 7PM-7AM, please contact night-coverage 01/06/2021, 8:42 AM

## 2021-01-06 NOTE — Progress Notes (Signed)
Progress Note  Patient Name: [name] Date of Encounter: 01/06/2021  Primary Cardiologist: Fletcher Anon  Subjective   No chest pain or dyspnea. He is for [facility] MPI today. CTA chest/abdomen/pelvis with 2.8 cm ectatic distal abdominal aorta, no obvious central PE. No aneurysmal dilatation or dissection of the aorta was noted on report. Echo showed an EF of 40-45%, global HK, mildly dilated LV internal cavity size, moderately reduced RV systolic function with moderately enlarged RV cavity size, moderately dilated left atrium, mildly dilated right atrium, small pericardial effusion noted posterior to the LV, mild MR, mild AI, mild to moderate aortic sclerosis without stenosis, estimated right atrial pressure 15 mmHg, moderate dilatation of the aortic root measuring 47 mm (prior 45 mm in 10/2020 by echo) and moderate to severe dilatation of the ascending aorta measuring 49 mm (prior 42 mm by echo in 10/2020). HS-Tn remains minimally elevated and flat trending at 70. Drug screen negative for cocaine this admission. BP improved.  Inpatient Medications    Scheduled Meds:  amLODipine  10 mg Oral Daily   aspirin EC  81 mg Oral Daily   bumetanide  1 mg Oral Once per day on Sun Tue Thu Sat   And   bumetanide  1 mg Oral Once per day on Sun Tue Thu Sat   carvedilol  6.25 mg Oral BID   Chlorhexidine Gluconate Cloth  6 each Topical Q0600   ferric citrate  420 mg Oral TID WC   hydrALAZINE  100 mg Oral TID   isosorbide mononitrate  60 mg Oral Daily   pantoprazole  40 mg Oral Daily   polyethylene glycol  17 g Oral Daily   sevelamer carbonate  1,600 mg Oral TID WC   tamsulosin  0.4 mg Oral QPC supper   Continuous Infusions:  sodium chloride     sodium chloride     PRN Meds: sodium chloride, sodium chloride, acetaminophen, albuterol, alteplase, heparin, HYDROmorphone (DILAUDID) injection, lidocaine (PF), lidocaine-prilocaine, nitroGLYCERIN, ondansetron (ZOFRAN) IV,  pentafluoroprop-tetrafluoroeth, senna   Vital Signs    Vitals:   01/05/21 1928 01/05/21 2119 01/05/21 2337 01/06/21 0759  BP:  (!) 141/82 (!) 157/89 (!) 145/87  Pulse: 85 90 79 82  Resp: '16 20 18   '$ Temp:   98.5 F (36.9 C) 98.1 F (36.7 C)  TempSrc:   Oral Oral  SpO2: 96% 94% 100% 100%  Weight:      Height:        Intake/Output Summary (Last 24 hours) at 01/06/2021 0901 Last data filed at 01/06/2021 0759 Gross per 24 hour  Intake --  Output 1476 ml  Net -1476 ml   Filed Weights   01/05/21 0231  Weight: 115.2 kg    Telemetry    Afib with 6 beta run of WCT possibly NSVT vs aberrancy - Personally Reviewed  ECG    No new tracings - Personally Reviewed  Physical Exam   GEN: No acute distress.   Neck: No JVD. Cardiac: IRIR, II/VI systolic murmur RSB, no rubs, or gallops.  Respiratory: Clear to auscultation bilaterally.  GI: Soft, nontender, non-distended.   MS: 1+ pretibial edema; No deformity. Neuro:  Alert and oriented x 3; Nonfocal.  Psych: Normal affect.  Labs    Chemistry Recent Labs  Lab 01/05/21 0234 01/05/21 1059 01/06/21 0713  NA 138  --  138  K 3.8  --  4.4  CL 100  --  100  CO2 27  --  26  GLUCOSE 117*  --  99  BUN 31*  --  36*  CREATININE 7.08* 7.31* 7.24*  CALCIUM 8.2*  --  8.8*  PROT 6.8  --   --   ALBUMIN 3.4*  --   --   AST 15  --   --   ALT 13  --   --   ALKPHOS 77  --   --   BILITOT 1.0  --   --   GFRNONAA 8* 8* 8*  ANIONGAP 11  --  12     Hematology Recent Labs  Lab 01/05/21 0234 01/06/21 0713  WBC 5.4 6.5  RBC 3.10* 3.11*  HGB 9.1* 9.1*  HCT 27.0* 27.0*  MCV 87.1 86.8  MCH 29.4 29.3  MCHC 33.7 33.7  RDW 15.9* 15.9*  PLT 100* 100*    Cardiac EnzymesNo results for input(s): TROPONINI in the last 168 hours. No results for input(s): TROPIPOC in the last 168 hours.   BNP Recent Labs  Lab 01/05/21 0234  BNP 4,499.0*     DDimer No results for input(s): DDIMER in the last 168 hours.   Radiology    DG Chest 2  View  Result Date: 01/05/2021 IMPRESSION: Cardiomegaly.  No active disease. Electronically Signed   By: Rolm Baptise M.D.   On: 01/05/2021 02:52   CT Chest Wo Contrast  Result Date: 01/05/2021 IMPRESSION: Extensive multi-vessel coronary artery calcification. Mild global cardiomegaly. Morphologic changes in keeping with pulmonary arterial hypertension. Minimal biapical centrilobular emphysema. Aortic Atherosclerosis (ICD10-I70.0) and Emphysema (ICD10-J43.9). Electronically Signed   By: Fidela Salisbury MD   On: 01/05/2021 06:32   US Venous Img Lower Unilateral Left (DVT)  Result Date: 01/05/2021 IMPRESSION: No evidence of deep venous thrombosis in the left lower extremity. Right common femoral vein also patent. Electronically Signed   By: Lowella Grip III M.D.   On: 01/05/2021 09:13   CT Angio Chest/Abd/Pel for Dissection W and/or W/WO  Result Date: 01/05/2021 IMPRESSION: 1. No acute intrathoracic, abdominal, or pelvic pathology. No CT evidence of aortic dissection. 2. 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 Follow-up as per recommendation of prior CT. 3. Mild cardiomegaly with a small pericardial effusion. 4. Aortic Atherosclerosis (ICD10-I70.0) and Emphysema (ICD10-J43.9). Electronically Signed   By: Anner Crete M.D.   On: 01/05/2021 19:59    Cardiac Studies   Lexiscan MPI 01/06/2021: Pending __________  2D echo 01/05/2021: 1. Left ventricular ejection fraction, by estimation, is 40 to 45%. The  left ventricle has mildly decreased function. The left ventricle  demonstrates global hypokinesis. The left ventricular internal cavity size  was mildly dilated. There is mild left  ventricular hypertrophy. Left ventricular diastolic parameters are  indeterminate. The average left ventricular global longitudinal strain is  -8.9 %. The global longitudinal strain is abnormal.   2. Right ventricular systolic function is moderately reduced. The right  ventricular size is moderately  enlarged.   3. Left atrial size was moderately dilated.   4. Right atrial size was mildly dilated.   5. A small pericardial effusion is present. The pericardial effusion is  posterior to the left ventricle.   6. The mitral valve is degenerative. Mild mitral valve regurgitation.   7. The aortic valve has an indeterminant number of cusps. There is mild  calcification of the aortic valve. There is severe thickening of the  aortic valve. Aortic valve regurgitation is mild. Mild to moderate aortic  valve sclerosis/calcification is  present, without any evidence of aortic stenosis.   8. Aortic dilatation noted.  There is moderate dilatation of the aortic  root, measuring 47 mm. There is moderate-severe dilatation of the  ascending aorta, measuring 49 mm.   9. The inferior vena cava is dilated in size with <50% respiratory  variability, suggesting right atrial pressure of 15 mmHg. __________  2D echo 10/10/2020 [facility]):  1. The left ventricle is normal in size with moderately increased wall  thickness.    2. The left ventricular systolic function is normal, LVEF is visually  estimated at > 55%.    3. The mitral valve leaflets are mildly thickened with normal leaflet  mobility.    4. There is mild mitral valve regurgitation.    5. The aortic valve is trileaflet with moderately thickened leaflets with  moderately reduced excursion.    6. There is mild aortic regurgitation.    7. The aorta at the sinuses of Valsalva is moderately dilated.    8. The left atrium is moderately dilated in size.    9. The right ventricle is normal in size, with normal systolic function.    10. The right atrium is moderately dilated  in size.    11. There is a small posterior pericardial effusion.    12. Limited study to assess ventricular function and pericardial effusion..  __________   2D echo 08/06/2020 [facility]): 1. The left ventricle is normal in size with severely increased wall  thickness, septal wall >  posterior wall.    2. The left ventricular systolic function is mildly decreased, LVEF is  visually estimated at 45-50%.    3. The left atrium is moderately dilated in size.    4. Mitral annular calcification is present.    5. The aortic valve is probably trileaflet with moderately thickened  leaflets with normal excursion.    6. The right ventricle is dilated in size, with mildly reduced systolic  function.    7. The right atrium is moderately dilated  in size.    8. The aorta at the sinuses of Valsalva is mildly dilated.  __________   2D echo 07/08/2020 [facility]):  1. The left ventricle is normal in size with severely increased wall  thickness.    2. The left ventricular systolic function is mildly decreased, LVEF is  visually estimated at 45%.    3. There is grade III diastolic dysfunction (severely elevated filling  pressure).    4. The mitral valve leaflets are mildly thickened with normal leaflet  mobility.    5. Mitral annular calcification is present (mild).    6. There is mild to moderate mitral valve regurgitation.    7. The aortic valve is probably trileaflet with moderately thickened  leaflets with mildly reduced excursion.    8. There is moderate aortic regurgitation.    9. The left atrium is severely dilated in size.    10. The right ventricle is normal in size, with normal systolic function.    11. The right atrium is dilated  in size.    12. There is a small, posterior pericardial effusion.    13. IVC size and inspiratory change suggest elevated right atrial pressure.  (10-20 mmHg). ___________   LHC 09/30/2019:   Prox RCA lesion is 20% stenosed. Prox RCA to Mid RCA lesion is 30% stenosed.   1.  Mildly calcified coronary arteries with mild nonobstructive coronary artery disease.  Difficult coronary angiography of the left system due to large coronary arteries with dilated aortic root.  Left coronary system was engaged with a JL  5 catheter. 2.  Left ventricular  angiography was not performed.  EF was normal by echo.  Normal left ventricular end-diastolic pressure.  No significant gradient across the aortic valve.   Recommendations: The chest pain seems to be noncardiac.  Recommend aggressive medical therapy for nonobstructive coronary artery disease. No further recommendations from our standpoint and the patient can be discharged home later today after dialysis if no other issues. __________   2D echo 09/29/2019: 1. Left ventricular ejection fraction, by estimation, is 55 to 60%. The  left ventricle has normal function. The left ventricle has no regional  wall motion abnormalities. There is moderate left ventricular hypertrophy.  Left ventricular diastolic  parameters are consistent with Grade II diastolic dysfunction  (pseudonormalization).   2. Right ventricular systolic function is normal. The right ventricular  size is normal.   3. Left atrial size was mildly dilated.   4. The aortic valve was not well visualized, unable to exclude bicuspid  aortic valve.   5. There is dilatation of the aortic root, 4.5 cm.  __________   2D echo 08/20/2018: 1. The left ventricle has normal systolic function of 0000000. The cavity  size was normal. There is moderately increased left ventricular wall  thickness. Left ventricular diastolic parameters were normal.   2. The right ventricle has normal systolic function. The cavity was  mildly enlarged. There is no increase in right ventricular wall thickness.   3. Trivial pericardial effusion.   4. The mitral valve is normal in structure.   5. The tricuspid valve is normal in structure. Tricuspid valve  regurgitation is mild-moderate.   6. The aortic valve is normal in structure. Aortic valve regurgitation is  mild by color flow Doppler.   7. The pulmonic valve was normal in structure.  __________   2D echo 01/18/2016: - Procedure narrative: Transthoracic echocardiography. The study    was technically  difficult, as a result of poor acoustic windows.  - Left ventricle: The cavity size was normal. There was moderate    concentric hypertrophy. Systolic function was normal. The    estimated ejection fraction was in the range of 60% to 65%.  - Aortic valve: Valve area (Vmax): 2.69 cm^2.  - Mitral valve: There was mild regurgitation.  Patient Profile     65 y.o. male with history of recurrent chest pain with nonobstructive CAD by [facility] on 09/30/2019, HFmrEF, dilated aortic root, ESRD on HD with nonadherence, recurrent PE treated with anticoagulation, substance abuse including cocaine and tobacco, cirrhosis, anemia of chronic disease, HTN, and chronic back pain who is being seen today for the evaluation of chest pain at the request of Dr. Francine Graven.  Assessment & Plan    1. Nonobstructive CAD with recurrent chest pain and minimally elevated troponin: -Currently chest pain-free  -Pain feels similar to what he experienced when he was diagnosed with a PE, with CTA chest showing no obvious central PE this admission -Presentation appears to be similar to his hospital admissions at [facility] earlier this year at which time symptoms improved with volume management  -Minimal troponin elevation that is flat trending peaking at 73 and not consistent with ACS -Some degree of troponinemia in the setting of ESRD with volume overload -Echo as above -Plan for Lexiscan MPI on 6/29 -N.p.o.    2. Persistent Afib: -He remains in A. fib with controlled ventricular response -Previously on Eliquis for history of recurrent PE and A. fib, though this was discontinued by nephrology (per patient) due to AV  fistula bleeding -Further details of this bleeding would be beneficial to determine if this was clinically significant bleeding versus just persistent mild bleed from AV fistula -CHA2DS2-VASc at least 3 (CHF, HTN, vascular disease) -As an outpatient, [facility] had him on aspirin only -Start heparin gtt -If anticoagulation is  reintroduced, he may need Coumadin   3.  History of recurrent PE: -Symptoms felt similar to his prior PEs upon admission  -He has been off anticoagulation for at least 6 months -CTA at [facility] in 10/2020, for similar symptoms was negative for PE -No obvious central PE on CTA chest -Right lower extremity ultrasound negative for DVT   4.  HFmrEF: -Echo from [facility] showed a preserved LV systolic function with an EF greater than 55% from 10/2020 -Echo this admission with an EF of 40-45%, with is consistent with his prior reading historically  -He does appear volume overloaded -PTA Bumex -Volume management largely per hemodialysis -PTA carvedilol, Imdur, hydralazine   5.  ESRD: -History of nonadherence leading to volume overload and hospital admissions -HD again today per nephrology   6.  HTN: -Likely a component of hypertensive heart disease -Improved -Amlodipine, carvedilol, Imdur, Bumex, and hydralazine are ordered -HD   7.  Dilated aortic root/ascending aorta: -Echo with progression of aortic dilatation  -CTA chest/abdomen/pelvis without notation of aneurysmal aortic dilatation  -Optimal BP control recommended -Outpatient follow-up   For questions or updates, please contact [facility] Please consult [email or web address] for contact info under Cardiology/STEMI.    Signed, Christell Faith, PA-C [place] Pager: [phone] 01/06/2021, 9:01 AM

## 2021-01-07 DIAGNOSIS — I420 Dilated cardiomyopathy: Secondary | ICD-10-CM

## 2021-01-07 DIAGNOSIS — I480 Paroxysmal atrial fibrillation: Secondary | ICD-10-CM

## 2021-01-07 DIAGNOSIS — Z992 Dependence on renal dialysis: Secondary | ICD-10-CM

## 2021-01-07 DIAGNOSIS — I5032 Chronic diastolic (congestive) heart failure: Secondary | ICD-10-CM

## 2021-01-07 DIAGNOSIS — R609 Edema, unspecified: Secondary | ICD-10-CM

## 2021-01-07 DIAGNOSIS — I482 Chronic atrial fibrillation, unspecified: Secondary | ICD-10-CM

## 2021-01-07 DIAGNOSIS — I25118 Atherosclerotic heart disease of native coronary artery with other forms of angina pectoris: Secondary | ICD-10-CM

## 2021-01-07 LAB — CBC
HCT: 28.6 % — ABNORMAL LOW (ref 39.0–52.0)
Hemoglobin: 9.3 g/dL — ABNORMAL LOW (ref 13.0–17.0)
MCH: 29.3 pg (ref 26.0–34.0)
MCHC: 32.5 g/dL (ref 30.0–36.0)
MCV: 90.2 fL (ref 80.0–100.0)
Platelets: 97 10*3/uL — ABNORMAL LOW (ref 150–400)
RBC: 3.17 MIL/uL — ABNORMAL LOW (ref 4.22–5.81)
RDW: 16.2 % — ABNORMAL HIGH (ref 11.5–15.5)
WBC: 5.9 10*3/uL (ref 4.0–10.5)
nRBC: 0 % (ref 0.0–0.2)

## 2021-01-07 LAB — BASIC METABOLIC PANEL
Anion gap: 10 (ref 5–15)
BUN: 24 mg/dL — ABNORMAL HIGH (ref 8–23)
CO2: 28 mmol/L (ref 22–32)
Calcium: 8.9 mg/dL (ref 8.9–10.3)
Chloride: 99 mmol/L (ref 98–111)
Creatinine, Ser: 5.65 mg/dL — ABNORMAL HIGH (ref 0.61–1.24)
GFR, Estimated: 11 mL/min — ABNORMAL LOW (ref >60)
Glucose, Bld: 102 mg/dL — ABNORMAL HIGH (ref 70–99)
Potassium: 4.1 mmol/L (ref 3.5–5.1)
Sodium: 137 mmol/L (ref 135–145)

## 2021-01-07 LAB — PHOSPHORUS: Phosphorus: 4.3 mg/dL (ref 2.5–4.6)

## 2021-01-07 LAB — HEPARIN LEVEL (UNFRACTIONATED): Heparin Unfractionated: <0.1 IU/mL — ABNORMAL LOW (ref 0.30–0.70)

## 2021-01-07 MED ORDER — APIXABAN 5 MG PO TABS: 5.0000 mg | ORAL_TABLET | Freq: Two times a day (BID) | ORAL | 0 refills | Status: AC

## 2021-01-07 MED ORDER — APIXABAN 5 MG PO TABS
5.0000 mg | ORAL_TABLET | Freq: Two times a day (BID) | ORAL | Status: DC
  Administered 2021-01-07: 5 mg via ORAL
  Filled 2021-01-07: qty 1

## 2021-01-07 MED ORDER — OXYCODONE HCL 5 MG PO TABS
5.0000 mg | ORAL_TABLET | Freq: Four times a day (QID) | ORAL | Status: DC | PRN
  Administered 2021-01-07: 5 mg via ORAL
  Filled 2021-01-07: qty 1

## 2021-01-07 MED ORDER — HEPARIN BOLUS VIA INFUSION
3000.0000 [IU] | Freq: Once | INTRAVENOUS | Status: DC
  Filled 2021-01-07: qty 3000

## 2021-01-07 MED ORDER — OXYCODONE HCL 5 MG PO TABS: 10.0000 mg | ORAL_TABLET | Freq: Four times a day (QID) | ORAL | Status: DC | PRN

## 2021-01-07 MED ORDER — AMOXICILLIN-POT CLAVULANATE 875-125 MG PO TABS: 1.0000 | ORAL_TABLET | Freq: Two times a day (BID) | ORAL | 0 refills | Status: AC

## 2021-01-07 MED ORDER — OXYCODONE HCL ER 10 MG PO T12A
20.0000 mg | EXTENDED_RELEASE_TABLET | Freq: Two times a day (BID) | ORAL | Status: DC
  Administered 2021-01-07: 20 mg via ORAL
  Filled 2021-01-07: qty 2

## 2021-01-07 NOTE — Progress Notes (Signed)
[facility]  ROUNDING NOTE   Subjective:   [name] is a 65 y.o. male with previous medical conditions including CAD, PE on apixaban, dHF, anemia, chronic pain on opiates and cocaine use, and ESRD on dialysis. He presents to the ED with complaints of chest pain.   Patient is a currently received dialysis at [facility] clinic followed by [facility]. He did receive a full treatment. He states this chest pain began yesterday and radiates to the back. He initially had shortness of breath and nausea, but not at this time. Currently on room air. Denies current chest pain. Labs on arrival are as follows: Na 138, K 3.8, BUN 31, Crt 7.08, and eGFR 8.   Patient seen resting in bed  Cardiac cath completed yesterday, no ischemia States he feels better and is ready for discharge Tolerating meals, completed breakfast tray at bedside Denies chest pain or discomfort  Heparin gtt    Objective:  Vital signs in last 24 hours:  Temp:  [97.9 F (36.6 C)-98.4 F (36.9 C)] 97.9 F (36.6 C) (06/30 1140) Pulse Rate:  [48-108] 66 (06/30 1140) Resp:  [13-20] 20 (06/30 0419) BP: (117-157)/(65-88) 121/65 (06/30 1140) SpO2:  [97 %-100 %] 100 % (06/30 1140) Weight:  [116.1 kg] 116.1 kg (06/30 0419)  Weight change:  Filed Weights   01/05/21 0231 01/07/21 0419  Weight: 115.2 kg 116.1 kg    Intake/Output: I/O last 3 completed shifts: In: 63.5 [I.V.:63.5] Out: 1975 [Urine:475; Other:1500]   Intake/Output this shift:  Total I/O In: 360 [P.O.:360] Out: 250 [Urine:250]  Physical Exam: General: NAD, laying in bed  Head: Normocephalic, atraumatic. Moist oral mucosal membranes  Eyes: Anicteric  Lungs:  Clear to auscultation  Heart: Irregular rate and rhythm  Abdomen:  Soft, nontender  Extremities:  2+ peripheral edema left leg, 1+ on right leg  Neurologic: Nonfocal, moving all four extremities  Skin: No lesions  Access: Lt AVF    Basic Metabolic Panel: Recent Labs  Lab  01/05/21 0234 01/05/21 1059 01/06/21 0713 01/07/21 0800  NA 138  --  138 137  K 3.8  --  4.4 4.1  CL 100  --  100 99  CO2 27  --  26 28  GLUCOSE 117*  --  99 102*  BUN 31*  --  36* 24*  CREATININE 7.08* 7.31* 7.24* 5.65*  CALCIUM 8.2*  --  8.8* 8.9  PHOS  --   --   --  4.3     Liver Function Tests: Recent Labs  Lab 01/05/21 0234  AST 15  ALT 13  ALKPHOS 77  BILITOT 1.0  PROT 6.8  ALBUMIN 3.4*    No results for input(s): LIPASE, AMYLASE in the last 168 hours. No results for input(s): AMMONIA in the last 168 hours.  CBC: Recent Labs  Lab 01/05/21 0234 01/06/21 0713 01/07/21 0800  WBC 5.4 6.5 5.9  HGB 9.1* 9.1* 9.3*  HCT 27.0* 27.0* 28.6*  MCV 87.1 86.8 90.2  PLT 100* 100* 97*     Cardiac Enzymes: No results for input(s): CKTOTAL, CKMB, CKMBINDEX, TROPONINI in the last 168 hours.  BNP: Invalid input(s): POCBNP  CBG: No results for input(s): GLUCAP in the last 168 hours.  Microbiology: Results for orders placed or performed during the hospital encounter of 01/05/21  Resp Panel by RT-PCR (Flu A&B, Covid) Nasopharyngeal Swab     Status: None   Collection Time: 01/05/21  5:21 AM   Specimen: Nasopharyngeal Swab; Nasopharyngeal(NP) swabs in  vial transport medium  Result Value Ref Range Status   SARS Coronavirus 2 by RT PCR NEGATIVE NEGATIVE Final    Comment: (NOTE) SARS-CoV-2 target nucleic acids are NOT DETECTED.  The SARS-CoV-2 RNA is generally detectable in upper respiratory specimens during the acute phase of infection. The lowest concentration of SARS-CoV-2 viral copies this assay can detect is 138 copies/mL. A negative result does not preclude SARS-Cov-2 infection and should not be used as the sole basis for treatment or other patient management decisions. A negative result may occur with  improper specimen collection/handling, submission of specimen other than nasopharyngeal swab, presence of viral mutation(s) within the areas targeted by this  assay, and inadequate number of viral copies(<138 copies/mL). A negative result must be combined with clinical observations, patient history, and epidemiological information. The expected result is Negative.  Fact Sheet for Patients:  EntrepreneurPulse.com.au  Fact Sheet for Healthcare Providers:  IncredibleEmployment.be  This test is no t yet approved or cleared by the [place] FDA and  has been authorized for detection and/or diagnosis of SARS-CoV-2 by FDA under an Emergency Use Authorization (EUA). This EUA will remain  in effect (meaning this test can be used) for the duration of the COVID-19 declaration under Section 564(b)(1) of the Act, 21 U.S.C.section 360bbb-3(b)(1), unless the authorization is terminated  or revoked sooner.       Influenza A by PCR NEGATIVE NEGATIVE Final   Influenza B by PCR NEGATIVE NEGATIVE Final    Comment: (NOTE) The Xpert Xpress SARS-CoV-2/FLU/RSV plus assay is intended as an aid in the diagnosis of influenza from Nasopharyngeal swab specimens and should not be used as a sole basis for treatment. Nasal washings and aspirates are unacceptable for Xpert Xpress SARS-CoV-2/FLU/RSV testing.  Fact Sheet for Patients: EntrepreneurPulse.com.au  Fact Sheet for Healthcare Providers: IncredibleEmployment.be  This test is not yet approved or cleared by the [place] FDA and has been authorized for detection and/or diagnosis of SARS-CoV-2 by FDA under an Emergency Use Authorization (EUA). This EUA will remain in effect (meaning this test can be used) for the duration of the COVID-19 declaration under Section 564(b)(1) of the Act, 21 U.S.C. section 360bbb-3(b)(1), unless the authorization is terminated or revoked.  Performed at [facility], [address] [place], [place] [address]   Aerobic/Anaerobic Culture w Gram Stain (surgical/deep wound)     Status: None  (Preliminary result)   Collection Time: 01/05/21  5:21 AM   Specimen: Leg; Wound  Result Value Ref Range Status   Specimen Description LEG LEFT  Final   Special Requests Normal  Final   Gram Stain   Final    RARE WBC PRESENT, PREDOMINANTLY PMN MODERATE GRAM POSITIVE COCCI IN PAIRS IN CLUSTERS    Culture   Final    FEW PROTEUS MIRABILIS CULTURE REINCUBATED FOR BETTER GROWTH    Report Status PENDING  Incomplete   Organism ID, Bacteria PROTEUS MIRABILIS  Final      Susceptibility   Proteus mirabilis - MIC*    AMPICILLIN <=2 SENSITIVE Sensitive     CEFAZOLIN <=4 SENSITIVE Sensitive     CEFEPIME <=0.12 SENSITIVE Sensitive     CEFTAZIDIME <=1 SENSITIVE Sensitive     CEFTRIAXONE <=0.25 SENSITIVE Sensitive     CIPROFLOXACIN <=0.25 SENSITIVE Sensitive     GENTAMICIN <=1 SENSITIVE Sensitive     IMIPENEM 2 SENSITIVE Sensitive     TRIMETH/SULFA <=20 SENSITIVE Sensitive     AMPICILLIN/SULBACTAM <=2 SENSITIVE Sensitive     PIP/TAZO Value in  next row Sensitive      <=4 SENSITIVEPerformed at [facility] [address] [place], [place] [address]    * FEW PROTEUS MIRABILIS    Coagulation Studies: Recent Labs    01/06/21 0929  LABPROT 15.7*  INR 1.3*     Urinalysis: No results for input(s): COLORURINE, LABSPEC, PHURINE, GLUCOSEU, HGBUR, BILIRUBINUR, KETONESUR, PROTEINUR, UROBILINOGEN, NITRITE, LEUKOCYTESUR in the last 72 hours.  Invalid input(s): APPERANCEUR    Imaging: NM Myocar Multi W/Spect W/Wall Motion / EF  Result Date: 01/06/2021  There was no ST segment deviation noted during stress.  No T wave inversion was noted during stress.  This is a low risk study.  The left ventricular ejection fraction is mildly decreased (46%).  There is significant perfusion defects on both stress and rest images which appear artefactual (motion, diaphragmatic).  There is no clear evidence for ischemia    ECHOCARDIOGRAM COMPLETE  Result Date: 01/05/2021    ECHOCARDIOGRAM REPORT    Patient Name:   [name] Date of Exam: 01/05/2021 Medical Rec #:  [id number]         Height:       72.0 in Accession #:    [id number]        Weight:       254.0 lb Date of Birth:  1956-02-15         BSA:          2.358 m Patient Age:    14 years          BP:           169/119 mmHg Patient Gender: M                 HR:           94 bpm. Exam Location:  [facility] Procedure: 2D Echo, Color Doppler, Cardiac Doppler and Strain Analysis Indications:     R07.9 Chest Pain  History:         Patient has prior history of Echocardiogram examinations, most                  recent 09/29/2019. Non-obstructive CAD, ESRD; Risk                  Factors:Hypertension.  Sonographer:     Charmayne Sheer RDCS (AE) Referring Phys:  [id number] Rise Mu Diagnosing Phys: Nelva Bush MD  Sonographer Comments: Suboptimal subcostal window. Global longitudinal strain was attempted. IMPRESSIONS  1. Left ventricular ejection fraction, by estimation, is 40 to 45%. The left ventricle has mildly decreased function. The left ventricle demonstrates global hypokinesis. The left ventricular internal cavity size was mildly dilated. There is mild left ventricular hypertrophy. Left ventricular diastolic parameters are indeterminate. The average left ventricular global longitudinal strain is -8.9 %. The global longitudinal strain is abnormal.  2. Right ventricular systolic function is moderately reduced. The right ventricular size is moderately enlarged.  3. Left atrial size was moderately dilated.  4. Right atrial size was mildly dilated.  5. A small pericardial effusion is present. The pericardial effusion is posterior to the left ventricle.  6. The mitral valve is degenerative. Mild mitral valve regurgitation.  7. The aortic valve has an indeterminant number of cusps. There is mild calcification of the aortic valve. There is severe thickening of the aortic valve. Aortic valve regurgitation is mild. Mild to moderate aortic valve sclerosis/calcification is  present, without any evidence of aortic stenosis.  8. Aortic dilatation noted.  There is moderate dilatation of the aortic root, measuring 47 mm. There is moderate-severe dilatation of the ascending aorta, measuring 49 mm.  9. The inferior vena cava is dilated in size with <50% respiratory variability, suggesting right atrial pressure of 15 mmHg. FINDINGS  Left Ventricle: Left ventricular ejection fraction, by estimation, is 40 to 45%. The left ventricle has mildly decreased function. The left ventricle demonstrates global hypokinesis. The average left ventricular global longitudinal strain is -8.9 %. The  global longitudinal strain is abnormal. The left ventricular internal cavity size was mildly dilated. There is mild left ventricular hypertrophy. Left ventricular diastolic parameters are indeterminate. Right Ventricle: The right ventricular size is moderately enlarged. No increase in right ventricular wall thickness. Right ventricular systolic function is moderately reduced. Left Atrium: Left atrial size was moderately dilated. Right Atrium: Right atrial size was mildly dilated. Pericardium: A small pericardial effusion is present. The pericardial effusion is posterior to the left ventricle. Mitral Valve: The mitral valve is degenerative in appearance. There is mild thickening of the mitral valve leaflet(s). There is mild calcification of the mitral valve leaflet(s). Mild mitral valve regurgitation. MV peak gradient, 5.1 mmHg. The mean mitral valve gradient is 2.0 mmHg. Tricuspid Valve: The tricuspid valve is normal in structure. Tricuspid valve regurgitation is trivial. Aortic Valve: The aortic valve has an indeterminant number of cusps. There is mild calcification of the aortic valve. There is severe thickening of the aortic valve. Aortic valve regurgitation is mild. Mild to moderate aortic valve sclerosis/calcification is present, without any evidence of aortic stenosis. Aortic valve mean gradient measures 9.0  mmHg. Aortic valve peak gradient measures 15.2 mmHg. Aortic valve area, by VTI measures 3.01 cm. Pulmonic Valve: The pulmonic valve was not well visualized. Pulmonic valve regurgitation is not visualized. No evidence of pulmonic stenosis. Aorta: Aortic dilatation noted. There is moderate dilatation of the aortic root, measuring 47 mm. There is moderate-severe dilatation of the ascending aorta, measuring 49 mm. Pulmonary Artery: The pulmonary artery is not well seen. Venous: The inferior vena cava is dilated in size with less than 50% respiratory variability, suggesting right atrial pressure of 15 mmHg. IAS/Shunts: The interatrial septum was not well visualized.  LEFT VENTRICLE PLAX 2D LVIDd:         5.70 cm  Diastology LVIDs:         4.50 cm  LV e' medial:    5.66 cm/s LV PW:         1.30 cm  LV E/e' medial:  17.4 LV IVS:        1.20 cm  LV e' lateral:   7.07 cm/s LVOT diam:     2.60 cm  LV E/e' lateral: 14.0 LV SV:         102 LV SV Index:   43       2D Longitudinal Strain LVOT Area:     5.31 cm 2D Strain GLS Avg:     -8.9 %  RIGHT VENTRICLE RV Basal diam:  4.20 cm LEFT ATRIUM            Index       RIGHT ATRIUM           Index LA diam:      5.10 cm  2.16 cm/m  RA Area:     23.80 cm LA Vol (A2C): 102.0 ml 43.25 ml/m RA Volume:   73.80 ml  31.29 ml/m LA Vol (A4C): 93.2 ml  39.52 ml/m  AORTIC VALVE  PULMONIC VALVE AV Area (Vmax):    2.78 cm     PV Vmax:       0.96 m/s AV Area (Vmean):   2.59 cm     PV Vmean:      66.500 cm/s AV Area (VTI):     3.01 cm     PV VTI:        0.165 m AV Vmax:           195.00 cm/s  PV Peak grad:  3.7 mmHg AV Vmean:          142.000 cm/s PV Mean grad:  2.0 mmHg AV VTI:            0.339 m AV Peak Grad:      15.2 mmHg AV Mean Grad:      9.0 mmHg LVOT Vmax:         102.00 cm/s LVOT Vmean:        69.200 cm/s LVOT VTI:          0.192 m LVOT/AV VTI ratio: 0.57  AORTA Ao Root diam: 4.70 cm MITRAL VALVE MV Area (PHT): 3.90 cm    SHUNTS MV Area VTI:   4.51 cm     Systemic VTI:  0.19 m MV Peak grad:  5.1 mmHg    Systemic Diam: 2.60 cm MV Mean grad:  2.0 mmHg MV Vmax:       1.13 m/s MV Vmean:      72.5 cm/s MV Decel Time: 194 msec MV E velocity: 98.77 cm/s Nelva Bush MD Electronically signed by Nelva Bush MD Signature Date/Time: 01/05/2021/4:52:40 PM    Final    CT Angio Chest/Abd/Pel for Dissection W and/or W/WO  Result Date: 01/05/2021 CLINICAL DATA:  66 year old male with chest and back pain. Concern for aortic dissection. EXAM: CT ANGIOGRAPHY CHEST, ABDOMEN AND PELVIS TECHNIQUE: Non-contrast CT of the chest was initially obtained. Multidetector CT imaging through the chest, abdomen and pelvis was performed using the standard protocol during bolus administration of intravenous contrast. Multiplanar reconstructed images and MIPs were obtained and reviewed to evaluate the vascular anatomy. CONTRAST:  134m OMNIPAQUE IOHEXOL 350 MG/ML SOLN COMPARISON:  Chest CT dated 01/05/2021 and CT abdomen pelvis dated 01/02/2020. FINDINGS: Evaluation is limited due to respiratory motion artifact as well as streak artifact caused by patient's arms and left hip arthroplasty. CTA CHEST FINDINGS Cardiovascular: There is mild cardiomegaly. There is a small pericardial effusion measuring 11 mm in thickness. Three vessel coronary vascular calcification. Moderate atherosclerotic calcification of the thoracic aorta. No aneurysmal dilatation or dissection. The origins of the great vessels of the aortic arch appear patent as visualized. Evaluation of the pulmonary arteries is very limited due to respiratory motion artifact and suboptimal opacification and timing of the contrast. No central pulmonary artery embolus identified. Mediastinum/Nodes: There is no hilar or mediastinal adenopathy. The esophagus is grossly unremarkable. No mediastinal fluid collection. Lungs/Pleura: Mild centrilobular emphysema. The lungs are clear. There is no pleural effusion or pneumothorax. The central airways  patent. Musculoskeletal: Osteopenia with degenerative changes of the spine. No acute osseous pathology. Review of the MIP images confirms the above findings. CTA ABDOMEN AND PELVIS FINDINGS VASCULAR Aorta: Advanced aortoiliac atherosclerotic disease. No dissection. 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 Follow-up as per recommendation of prior CT. No periaortic fluid collection. Celiac: The celiac axis and its major branches are patent. SMA: Atherosclerotic calcification of the origin of the SMA. The SMA remains patent. Renals: Atherosclerotic calcification of the  renal arteries. The renal arteries are patent. IMA: Patent without evidence of aneurysm, dissection, vasculitis or significant stenosis. Inflow: Patent without evidence of aneurysm, dissection, vasculitis or significant stenosis. Veins: No obvious venous abnormality within the limitations of this arterial phase study. Review of the MIP images confirms the above findings. NON-VASCULAR No intra-abdominal free air. Trace free fluid in the pelvis. Hepatobiliary: Slight irregularity of the liver contour may represent early changes of cirrhosis. Clinical correlation is recommended. No intrahepatic biliary dilatation. The gallbladder is unremarkable. Pancreas: Unremarkable. No pancreatic ductal dilatation or surrounding inflammatory changes. Spleen: Normal in size without focal abnormality. Adrenals/Urinary Tract: The adrenal glands unremarkable. Atrophic kidneys. Similar appearance of nodular soft tissue in the interpolar right kidney which may represent renal parenchyma. A 12 mm hypodense lesion from the superior pole of the left kidney is not characterized on this CT. There is no hydronephrosis on either side. The urinary bladder is grossly unremarkable. Stomach/Bowel: There is sigmoid diverticulosis without active inflammatory changes. There is no bowel obstruction or active inflammation. The appendix is not visualized with certainty. No  inflammatory changes identified in the right lower quadrant. Lymphatic: No adenopathy. Reproductive: The prostate gland is poorly visualized. Other: There is diffuse subcutaneous edema and anasarca. Musculoskeletal: Osteopenia with degenerative changes of the spine. Total left hip arthroplasty. No acute osseous pathology. Review of the MIP images confirms the above findings. IMPRESSION: 1. No acute intrathoracic, abdominal, or pelvic pathology. No CT evidence of aortic dissection. 2. 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 Follow-up as per recommendation of prior CT. 3. Mild cardiomegaly with a small pericardial effusion. 4. Aortic Atherosclerosis (ICD10-I70.0) and Emphysema (ICD10-J43.9). Electronically Signed   By: Anner Crete M.D.   On: 01/05/2021 19:59     Medications:      amLODipine  10 mg Oral Daily   apixaban  5 mg Oral BID   aspirin EC  81 mg Oral Daily   atorvastatin  40 mg Oral QHS   bumetanide  1 mg Oral Once per day on Sun Tue Thu Sat   And   bumetanide  1 mg Oral Once per day on Sun Tue Thu Sat   carvedilol  6.25 mg Oral BID   Chlorhexidine Gluconate Cloth  6 each Topical Q0600   ferric citrate  420 mg Oral TID WC   hydrALAZINE  100 mg Oral TID   isosorbide mononitrate  60 mg Oral Daily   oxyCODONE  20 mg Oral Q12H   pantoprazole  40 mg Oral Daily   polyethylene glycol  17 g Oral Daily   sevelamer carbonate  1,600 mg Oral TID WC   tamsulosin  0.4 mg Oral QPC supper   acetaminophen, albuterol, nitroGLYCERIN, ondansetron (ZOFRAN) IV, oxyCODONE, senna  Assessment/ Plan:  Mr. ESTEPHAN GALLARDO is a 65 y.o.  male with previous medical conditions including CAD, PE on apixaban, dHF, anemia, chronic pain on opiates and cocaine use, and ESRD on dialysis. He presents to the ED with complaints of chest pain  [facility]/MWF/Lt AVF/113.5kg  Hypertension, 153/78 Home regimen includes Amlodipine, hydralazine, isosorbide, and irbesartan Bumetanide on non dialysis  days.  Irbesartan held Carvedilol 6.25 po BID Bp stable on current regimen  2 End stage renal disease on dialysis Received full treatment day prior to admission Received dialysis yesterday, UF goal 1.5L removed Next treatment on Friday, if remains inpatient   3 Anemia of chronic kidney disease Lab Results  Component Value Date   HGB 9.3 (L) 01/07/2021  Hgb below goal Received EPO and hectorol outpatient Will monitor  4  Secondary Hyperparathyroidism: with outpatient labs: phosphorus 7.6, calcium 8.5 on 12/21/20.   Lab Results  Component Value Date   PTH 758 (H) 08/15/2018   CALCIUM 8.9 01/07/2021   PHOS 4.3 01/07/2021  Phosphorus at goal, Auryxia ordered outpatient      LOS: 1 Kamdin Follett 6/30/202212:21 PM

## 2021-01-07 NOTE — Consult Note (Addendum)
[facility] for Heparin gtt Indication: atrial fibrillation  Patient Measurements: Heparin Dosing Weight: 102.5kg  Labs: Recent Labs    01/05/21 0234 01/05/21 0521 01/05/21 1059 01/05/21 1612 01/06/21 0713 01/06/21 0929 01/06/21 1903  HGB 9.1*  --   --   --  9.1*  --   --   HCT 27.0*  --   --   --  27.0*  --   --   PLT 100*  --   --   --  100*  --   --   APTT  --   --   --   --   --  35  --   LABPROT  --   --   --   --   --  15.7*  --   INR  --   --   --   --   --  1.3*  --   HEPARINUNFRC  --   --   --   --   --   --  <0.10*  CREATININE 7.08*  --  7.31*  --  7.24*  --   --   TROPONINIHS 70* 73* 69* 70*  --   --   --      Estimated Creatinine Clearance: 13.9 mL/min (A) (by C-G formula based on SCr of 7.24 mg/dL (H)).   Medications: No AC/APT pertinent allergies. PTA: No AC (stopped eliquis d/t bleeding and no recent fills; pt reports not taking PTA). Pt takes '81mg'$  ASA QD. 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 Negative w/u for DVT/PE, trops low. Pharmacy consulted for mgmt of Heparin gtt in Afib.  Date Time aPTT/HL Rate/Comment 6/29 1903 ----/<0.10 Subthera; 1500>1800 un/hr 0630 0808 No result Called lab; re-run HL; result below '@1038'$   0630 0808  <0.1   Per RN no interruptions. 1800>2100 un/hr  Baseline Labs: aPTT - 35 INR - 1.3 Hgb - 9.1>9.3 Plts - 100>97  Goal of Therapy:  Heparin level 0.3-0.7 units/ml Monitor platelets by anticoagulation protocol: Yes   Plan:  Last heparin level was subtherapeutic and gtt inc'd. Called Lab since HL was delayed ordered for 0500; drawn 0800; in-process until 1000 but no result at that time. Per d/w lab, will need to re-run the lab to get result.  Anti-Xa resulted less than  assay; confirmed per RN gtt was not interrupted. Per d/w cards, will switch back to Eliquis after d/w patient. Heparin orders and labs d/c'd. Consult complete. Eliquis '5mg'$  BID (age<80yo; Wt >60kg; ESRD-HD) to start at 12noon 6/30  q72h CBC per protocol while on [place] will sign off at this time. Shanon Brow Khaila Velarde 01/07/2021,7:21 AM

## 2021-01-07 NOTE — Care Management Important Message (Signed)
Important Message  Patient Details  Name: [name] MRN: [id number] Date of Birth: 01-22-1956   Medicare Important Message Given:  N/A - LOS <3 / Initial given by admissions   Initial Medicare IM reviewed with Sherry Ruffing, father at [phone] by Brantley Fling, Patient Access Associate on 01/07/2021 at 9:48am.    Dannette Barbara 01/07/2021, 1:36 PM

## 2021-01-07 NOTE — Progress Notes (Signed)
Progress Note  Patient Name: [name] Date of Encounter: 01/07/2021  Primary Cardiologist: Kathlyn Sacramento, MD  Subjective   No chest pain or sob.  Notes left sided low back pain this AM, which is chronic.  Eager to go home.  Inpatient Medications    Scheduled Meds:  amLODipine  10 mg Oral Daily   aspirin EC  81 mg Oral Daily   atorvastatin  40 mg Oral QHS   bumetanide  1 mg Oral Once per day on Sun Tue Thu Sat   And   bumetanide  1 mg Oral Once per day on Sun Tue Thu Sat   carvedilol  6.25 mg Oral BID   Chlorhexidine Gluconate Cloth  6 each Topical Q0600   ferric citrate  420 mg Oral TID WC   hydrALAZINE  100 mg Oral TID   isosorbide mononitrate  60 mg Oral Daily   pantoprazole  40 mg Oral Daily   polyethylene glycol  17 g Oral Daily   sevelamer carbonate  1,600 mg Oral TID WC   tamsulosin  0.4 mg Oral QPC supper   Continuous Infusions:  heparin 1,800 Units/hr (01/07/21 0136)   PRN Meds: acetaminophen, albuterol, HYDROmorphone (DILAUDID) injection, nitroGLYCERIN, ondansetron (ZOFRAN) IV, senna   Vital Signs    Vitals:   01/06/21 1730 01/06/21 1745 01/06/21 1917 01/07/21 0419  BP: (!) 142/87 (!) 143/82 (!) 143/80 117/69  Pulse: 74 79 78 84  Resp: '18 16 17 20  '$ Temp:   98 F (36.7 C) 98 F (36.7 C)  TempSrc:   Oral   SpO2: 98% 100% 99% 97%  Weight:    116.1 kg  Height:        Intake/Output Summary (Last 24 hours) at 01/07/2021 0800 Last data filed at 01/06/2021 2100 Gross per 24 hour  Intake 63.5 ml  Output 1800 ml  Net -1736.5 ml   Filed Weights   01/05/21 0231 01/07/21 0419  Weight: 115.2 kg 116.1 kg    Physical Exam   GEN: Well nourished, well developed, in no acute distress.  HEENT: Grossly normal.  Neck: Supple, no JVD, carotid bruits, or masses. Cardiac: IR, IR 2/6 SEM @ upper sternal borders, no rubs, or gallops. No clubbing, cyanosis, trace bilat LE edema.  Radials 2+, DP/PT 2+ and equal bilaterally.  Respiratory:  Respirations  regular and unlabored, diminished breath sounds bilaterally. GI: Obese, soft, nontender, nondistended, BS + x 4. MS: no deformity or atrophy. Skin: warm and dry, no rash. Neuro:  Strength and sensation are intact. Psych: AAOx3.  Normal affect.  Labs    Chemistry Recent Labs  Lab 01/05/21 0234 01/05/21 1059 01/06/21 0713  NA 138  --  138  K 3.8  --  4.4  CL 100  --  100  CO2 27  --  26  GLUCOSE 117*  --  99  BUN 31*  --  36*  CREATININE 7.08* 7.31* 7.24*  CALCIUM 8.2*  --  8.8*  PROT 6.8  --   --   ALBUMIN 3.4*  --   --   AST 15  --   --   ALT 13  --   --   ALKPHOS 77  --   --   BILITOT 1.0  --   --   GFRNONAA 8* 8* 8*  ANIONGAP 11  --  12     Hematology Recent Labs  Lab 01/05/21 0234 01/06/21 0713  WBC 5.4 6.5  RBC 3.10* 3.11*  HGB  9.1* 9.1*  HCT 27.0* 27.0*  MCV 87.1 86.8  MCH 29.4 29.3  MCHC 33.7 33.7  RDW 15.9* 15.9*  PLT 100* 100*    Cardiac Enzymes  Recent Labs  Lab 01/05/21 0234 01/05/21 0521 01/05/21 1059 01/05/21 1612  TROPONINIHS 70* 73* 69* 70*      BNP Recent Labs  Lab 01/05/21 0234  BNP 4,499.0*    Lipids  Lab Results  Component Value Date   CHOL 135 09/28/2019   HDL 48 09/28/2019   LDLCALC 78 09/28/2019   TRIG 46 09/28/2019   CHOLHDL 2.8 09/28/2019    HbA1c  Lab Results  Component Value Date   HGBA1C 5.0 09/29/2019    Radiology    DG Chest 2 View  Result Date: 01/05/2021 CLINICAL DATA:  Short shortness of breath EXAM: CHEST - 2 VIEW COMPARISON:  09/13/2020 FINDINGS: Cardiomegaly. No confluent opacities, effusions or edema. No acute bony abnormality. IMPRESSION: Cardiomegaly.  No active disease. Electronically Signed   By: Rolm Baptise M.D.   On: 01/05/2021 02:52   CT Chest Wo Contrast  Result Date: 01/05/2021 CLINICAL DATA:  Chest pain, end-stage renal disease on hemodialysis, atrial fibrillation EXAM: CT CHEST WITHOUT CONTRAST TECHNIQUE: Multidetector CT imaging of the chest was performed following the standard  protocol without IV contrast. COMPARISON:  01/02/2020 FINDINGS: Cardiovascular: There is extensive multi-vessel coronary artery calcification present, similar to that noted on prior examination. Mild global cardiomegaly. Trace pericardial fluid. The central pulmonary arteries are enlarged in keeping with changes of pulmonary artery hypertension. Mild atherosclerotic calcification is seen within the thoracic aorta. No aortic aneurysm. Mediastinum/Nodes: The visualized thyroid is unremarkable. No pathologic thoracic adenopathy. The esophagus is unremarkable. Lungs/Pleura: Minimal biapical centrilobular emphysema. Lungs are otherwise clear. No pleural effusion or pneumothorax. The central airways are widely patent. Upper Abdomen: The kidneys are atrophic in keeping with the given history of chronic renal failure. No acute abnormality within the visualized upper abdomen. Musculoskeletal: No acute bone abnormality. No lytic or blastic bone lesion. IMPRESSION: Extensive multi-vessel coronary artery calcification. Mild global cardiomegaly. Morphologic changes in keeping with pulmonary arterial hypertension. Minimal biapical centrilobular emphysema. Aortic Atherosclerosis (ICD10-I70.0) and Emphysema (ICD10-J43.9). Electronically Signed   By: Fidela Salisbury MD   On: 01/05/2021 06:32   US Venous Img Lower Unilateral Left (DVT)  Result Date: 01/05/2021 CLINICAL DATA:  Lower extremity pain and edema EXAM: LEFT LOWER EXTREMITY VENOUS DUPLEX ULTRASOUND TECHNIQUE: Gray-scale sonography with graded compression, as well as color Doppler and duplex ultrasound were performed to evaluate the left lower extremity deep venous system from the level of the common femoral vein and including the common femoral, femoral, profunda femoral, popliteal and calf veins including the posterior tibial, peroneal and gastrocnemius veins when visible. The superficial great saphenous vein was also interrogated. Spectral Doppler was utilized to  evaluate flow at rest and with distal augmentation maneuvers in the common femoral, femoral and popliteal veins. COMPARISON:  February 19, 2014 FINDINGS: Contralateral Common Femoral Vein: Respiratory phasicity is normal and symmetric with the symptomatic side. No evidence of thrombus. Normal compressibility. Common Femoral Vein: No evidence of thrombus. Normal compressibility, respiratory phasicity and response to augmentation. Saphenofemoral Junction: No evidence of thrombus. Normal compressibility and flow on color Doppler imaging. Profunda Femoral Vein: No evidence of thrombus. Normal compressibility and flow on color Doppler imaging. Femoral Vein: No evidence of thrombus. Normal compressibility, respiratory phasicity and response to augmentation. Popliteal Vein: No evidence of thrombus. Normal compressibility, respiratory phasicity and response to augmentation. Calf Veins: No  evidence of thrombus. Normal compressibility and flow on color Doppler imaging. Superficial Great Saphenous Vein: No evidence of thrombus. Normal compressibility. Venous Reflux:  None. Other Findings:  None. IMPRESSION: No evidence of deep venous thrombosis in the left lower extremity. Right common femoral vein also patent. Electronically Signed   By: Lowella Grip III M.D.   On: 01/05/2021 09:13   CT Angio Chest/Abd/Pel for Dissection W and/or W/WO  Result Date: 01/05/2021 CLINICAL DATA:  65 year old male with chest and back pain. Concern for aortic dissection. EXAM: CT ANGIOGRAPHY CHEST, ABDOMEN AND PELVIS TECHNIQUE: Non-contrast CT of the chest was initially obtained. Multidetector CT imaging through the chest, abdomen and pelvis was performed using the standard protocol during bolus administration of intravenous contrast. Multiplanar reconstructed images and MIPs were obtained and reviewed to evaluate the vascular anatomy. CONTRAST:  138m OMNIPAQUE IOHEXOL 350 MG/ML SOLN COMPARISON:  Chest CT dated 01/05/2021 and CT abdomen  pelvis dated 01/02/2020. FINDINGS: Evaluation is limited due to respiratory motion artifact as well as streak artifact caused by patient's arms and left hip arthroplasty. CTA CHEST FINDINGS Cardiovascular: There is mild cardiomegaly. There is a small pericardial effusion measuring 11 mm in thickness. Three vessel coronary vascular calcification. Moderate atherosclerotic calcification of the thoracic aorta. No aneurysmal dilatation or dissection. The origins of the great vessels of the aortic arch appear patent as visualized. Evaluation of the pulmonary arteries is very limited due to respiratory motion artifact and suboptimal opacification and timing of the contrast. No central pulmonary artery embolus identified. Mediastinum/Nodes: There is no hilar or mediastinal adenopathy. The esophagus is grossly unremarkable. No mediastinal fluid collection. Lungs/Pleura: Mild centrilobular emphysema. The lungs are clear. There is no pleural effusion or pneumothorax. The central airways patent. Musculoskeletal: Osteopenia with degenerative changes of the spine. No acute osseous pathology. Review of the MIP images confirms the above findings. CTA ABDOMEN AND PELVIS FINDINGS VASCULAR Aorta: Advanced aortoiliac atherosclerotic disease. No dissection. 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 Follow-up as per recommendation of prior CT. No periaortic fluid collection. Celiac: The celiac axis and its major branches are patent. SMA: Atherosclerotic calcification of the origin of the SMA. The SMA remains patent. Renals: Atherosclerotic calcification of the renal arteries. The renal arteries are patent. IMA: Patent without evidence of aneurysm, dissection, vasculitis or significant stenosis. Inflow: Patent without evidence of aneurysm, dissection, vasculitis or significant stenosis. Veins: No obvious venous abnormality within the limitations of this arterial phase study. Review of the MIP images confirms the above findings.  NON-VASCULAR No intra-abdominal free air. Trace free fluid in the pelvis. Hepatobiliary: Slight irregularity of the liver contour may represent early changes of cirrhosis. Clinical correlation is recommended. No intrahepatic biliary dilatation. The gallbladder is unremarkable. Pancreas: Unremarkable. No pancreatic ductal dilatation or surrounding inflammatory changes. Spleen: Normal in size without focal abnormality. Adrenals/Urinary Tract: The adrenal glands unremarkable. Atrophic kidneys. Similar appearance of nodular soft tissue in the interpolar right kidney which may represent renal parenchyma. A 12 mm hypodense lesion from the superior pole of the left kidney is not characterized on this CT. There is no hydronephrosis on either side. The urinary bladder is grossly unremarkable. Stomach/Bowel: There is sigmoid diverticulosis without active inflammatory changes. There is no bowel obstruction or active inflammation. The appendix is not visualized with certainty. No inflammatory changes identified in the right lower quadrant. Lymphatic: No adenopathy. Reproductive: The prostate gland is poorly visualized. Other: There is diffuse subcutaneous edema and anasarca. Musculoskeletal: Osteopenia with degenerative changes of the spine. Total  left hip arthroplasty. No acute osseous pathology. Review of the MIP images confirms the above findings. IMPRESSION: 1. No acute intrathoracic, abdominal, or pelvic pathology. No CT evidence of aortic dissection. 2. 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 Follow-up as per recommendation of prior CT. 3. Mild cardiomegaly with a small pericardial effusion. 4. Aortic Atherosclerosis (ICD10-I70.0) and Emphysema (ICD10-J43.9). Electronically Signed   By: Anner Crete M.D.   On: 01/05/2021 19:59    Telemetry    Afib, 70's to 80's, rare PVCs, brief episode of ventricular bigeminy - Personally Reviewed  Cardiac Studies   Cardiac Catheterization  3.22.21  Left Main  Vessel  is large. Vessel is angiographically normal.  Left Anterior Descending  The vessel exhibits minimal luminal irregularities.  First Diagonal Branch  Vessel is angiographically normal.  Second Diagonal Branch  The vessel exhibits minimal luminal irregularities.  Left Circumflex  Vessel is large. The vessel exhibits minimal luminal irregularities.  Right Coronary Artery  Vessel is large.  Prox RCA lesion 20% stenosed  Prox RCA lesion is 20% stenosed.  Prox RCA to Mid RCA lesion 30% stenosed  Prox RCA to Mid RCA lesion is 30% stenosed.  _____________     2D echo 01/05/2021: 1. Left ventricular ejection fraction, by estimation, is 40 to 45%. The  left ventricle has mildly decreased function. The left ventricle  demonstrates global hypokinesis. The left ventricular internal cavity size  was mildly dilated. There is mild left  ventricular hypertrophy. Left ventricular diastolic parameters are  indeterminate. The average left ventricular global longitudinal strain is  -8.9 %. The global longitudinal strain is abnormal.   2. Right ventricular systolic function is moderately reduced. The right  ventricular size is moderately enlarged.   3. Left atrial size was moderately dilated.   4. Right atrial size was mildly dilated.   5. A small pericardial effusion is present. The pericardial effusion is  posterior to the left ventricle.   6. The mitral valve is degenerative. Mild mitral valve regurgitation.   7. The aortic valve has an indeterminant number of cusps. There is mild  calcification of the aortic valve. There is severe thickening of the  aortic valve. Aortic valve regurgitation is mild. Mild to moderate aortic  valve sclerosis/calcification is  present, without any evidence of aortic stenosis.   8. Aortic dilatation noted. There is moderate dilatation of the aortic  root, measuring 47 mm. There is moderate-severe dilatation of the  ascending aorta, measuring 49 mm.   9. The inferior  vena cava is dilated in size with <50% respiratory  variability, suggesting right atrial pressure of 15 mmHg. __________  Wille Glaser 6.29.2022  There was no ST segment deviation noted during stress. No T wave inversion was noted during stress. This is a low risk study. The left ventricular ejection fraction is mildly decreased (46%). There is significant perfusion defects on both stress and rest images which appear artefactual (motion, diaphragmatic). There is no clear evidence for ischemia. _____________   Patient Profile     65 y.o. male with history of recurrent chest pain with nonobstructive CAD by [facility] on 09/30/2019, HFmrEF, dilated aortic root, ESRD on HD with nonadherence, recurrent PE treated with anticoagulation, substance abuse including cocaine and tobacco, cirrhosis, anemia of chronic disease, HTN, and chronic back pain, who was admitted 6/28 w/ chest pain and mild/flat HsTrop elevation (70  73  69  70).  Assessment & Plan    1.  Chest pain/Demand Ischemia:  pt w/ multiple  evaluations 2/2 chest pain, including cath in 09/2019 revealing minimal nonobstructive dzs.  Admitted 6/28 w/ chest pain. CTA neg for PE.  HsTrop peaked @ 73, with overall flat trend.  Lexiscan myoview on 6/29 was low risk w/o ischemia.  No c/p this AM.  No further cardiac w/u warranted.  Cont secondary prevention in the setting of nonobstructive CAD - resume home dose of atorvastatin.  Currently on ASA, but rec d/c if plan is to place back on DOAC.  2.  Persistent AFib:  rate controlled on carvedilol.  Pt notes that he was prev on eliquis but that it was d/c'd due to oozing around HD access site.  He denies any h/o req transfusion.  He is currently on heparin.  Between his h/o persistent afib and recurrent PE's, it would appear that risk of adverse outcomes related to these diagnoses outweighs bleeding risk r/t HD access oozing.  With wt > 60 kg and age < 42, eliquis '5mg'$  BID would be appropriate dose.  Will  d/w Dr. Rockey Situ.  3.  HFmrEF/NICM:  EF 40-45% by echo this admission.  Only trace ankle edema on exam.  Volume mgmt per nephrology.  Cont ? blocker, hydral/nitrate.  Non on acei/arb/arni/mra 2/2 ESRD on HD.  SGLT2i contraindicated.  4.  Essential HTN:  stable this AM on current meds.  5.  HL:  Resume home dose of statin.  6.  Recurrent PE:  Low threshold to resume [place] as noted above.  7.  ESRD:  per nephrology.  8.  Dilated Ao root/Asc Ao:  51m on echo.  Annual outpt imaging f/u.  Signed, CMurray Hodgkins NP  01/07/2021, 8:00 AM    For questions or updates, please contact   Please consult [email or web address] for contact info under Cardiology/STEMI.

## 2021-01-07 NOTE — Consult Note (Addendum)
[facility] Nurse Consult Note: Patient receiving care in [facility]. Reason for Consult:LLE wound Wound type: early stages of venous stasis disease. Wound started as a "blister" 3 weeks ago according to the patient. Pressure Injury POA: Yes/No/NA Measurement: 3.3 cm x 3.6 cm Wound bed: 100% red Drainage (amount, consistency, odor) serosanginous on existing dressing. Periwound: intact, edematous Dressing procedure/placement/frequency: Today I cleansed the wound with No Rinse spray and placed a folded Xeroform gauze over the area, then a square foam dressing. Beginning behind the toes I spiral wrapped a 4 inch ace wrap up the leg. The dressing instructions for the nurses are as follows, and are to begin 7/1: Cleanse LLE wound with No rinse spray. Place a folded Xeroform gauze over the area, then a square foam dressing.  Beginning behind the toes, spiral wrap a 4 inch ace bandage up the leg. Change daily.  This patient needs compression hose.  If you agree, please provide a referral to a practitioner that can fit him for compression hose.  Monitor the wound area(s) for worsening of condition such as: Signs/symptoms of infection,  Increase in size,  Development of or worsening of odor, Development of pain, or increased pain at the affected locations.  Notify the medical team if any of these develop.  Thank you for the consult.  Discussed plan of care with the patient.  [place] nurse will not follow at this time.  Please re-consult the [place] team if needed.  Val Riles, RN, MSN, CWOCN, CNS-BC, pager [phone]

## 2021-01-07 NOTE — Discharge Summary (Addendum)
Physician Discharge Summary  [name] [id number] DOB: 04-18-1956 DOA: 01/05/2021  PCP: Patient, No Pcp Per (Inactive)  Admit date: 01/05/2021 Discharge date: 01/07/2021  Admitted From: home  Disposition:  home   Recommendations for Outpatient Follow-up:  Follow up with PCP in 1-2 weeks F/u w/ cardio at [facility] in 1 week F/u w/ nephro at [facility] in 1-2 weeks   Home Health: no  Equipment/Devices:  Discharge Condition: stable  CODE STATUS: full Diet recommendation: Renal diet  Brief/Interim Summary: HPI was taken from Dr. Francine Graven: [name] is a 65 y.o. male with medical history significant for ESRD on HD MWF, CAD with history of MI but no PCI, history of PE on apixaban, diastolic heart failure, anemia of chronic disease,  chronic pain on opiates and history of cocaine use, with hospitalization from 09/28/19 09/30/2019 for evaluation of chest pain,  with left heart cath showing nonobstructive CAD with 20% stenosis proximal RCA and 30% mid RCA, who returns to the emergency room with complaints of midsternal chest pain that started after dialysis 1 day prior to his admission.  He rates his pain a 9 x 10 in intensity at its worst with radiation to the back and associated with shortness of breath, nausea and multiple episodes of emesis.  He has swelling involving his left leg and also has an ulcerated area over his left lower extremity with purulent drainage. He denies having any diaphoresis or palpitations.  He denies feeling dizzy or lightheaded and has had no fever, no chills, no headache, no abdominal pain, no changes in his bowel habits, no focal deficits, no blurred vision. Labs show sodium 138, potassium 3.8, chloride 100, bicarb 27, glucose 117, BUN 31, creatinine 7.08, calcium 8.2, alkaline phosphatase 77, albumin 3.4, AST 15, ALT 13, total protein 6.8, BNP 4499, troponin 70 >> 73, white count 5.4, hemoglobin 9.1, hematocrit 27, MCV 87.1, RDW 15.9, platelet count 100 Respiratory  viral panel is negative Chest x-ray reviewed by me shows cardiomegaly with no active cardiopulmonary disease. CT scan of the chest without contrast shows extensive multi-vessel coronary artery calcification. Mild global cardiomegaly. Morphologic changes in keeping with pulmonary arterial hypertension. Minimal biapical centrilobular emphysema. Aortic Atherosclerosis and Emphysema . Left lower extremity venous Doppler shows No evidence of deep venous thrombosis in the left lower extremity. Right common femoral vein also patent. Twelve-lead EKG reviewed by me shows junctional rhythm with T wave abnormality in the lateral leads.     ED Course: Patient is a 65 year old African-American male who has a history of coronary artery disease as well as end-stage renal disease on hemodialysis.  He presents to the ER for evaluation of midsternal chest pain associated with shortness of breath, nausea and multiple episodes of emesis.  He has a slight bump in his troponin and will be admitted to the hospital for further evaluation.   Hospital course from Dr. Jimmye Norman 6/29-6/30/22: Pt presented w/ chests pain and cardiac stress test was done which was neg for ischemia. Pt was restarted back on eliquis '5mg'$  BID as per cardio. Aspirin was d/c. Pt will f/u outpatient w/ his cardiologist at [facility] in 1 week. Of note, pt did receive HD while inpatient. For more information, please see previous progress/consult notes.   Discharge Diagnoses:  Principal Problem:   Chest pain Active Problems:   HEPATITIS C   ESRD (end stage renal disease) on dialysis (HCC)   Thrombocytopenia (HCC)   Chronic diastolic CHF (congestive heart failure) (HCC)   CAD (coronary artery  disease)   AF (paroxysmal atrial fibrillation) (HCC)   Persistent atrial fibrillation (HCC)   HFrEF (heart failure with reduced ejection fraction) (HCC)   Chest pain: atypical. Etiology unclear. Hx of CAD. Recent left heart cath showing nonobstructive CAD with  30% stenosis mid RCA, 20% proximal RCA. Continue carvedilol, imdur, statin and eliquis. S/p cardiac stress test which was neg for ischemia    History of substance abuse: pt denies recent use of cocaine. Drug screen was positive for opiates only   History of pulmonary embolism: restarted eliquis as per cardio    ESRD: on HD MWF. Nephro following   ACD: likely secondary to ESRD. No need for a transfusion currently  LLE wound: growing proteus mirabilis and possible anaerobe. Pt was d/c home w/ po augmentin but augmentin was resistant so I called in po cipro (per cx results) to pt's pharmacy on file and called the pt to let him know but pt was not at home so I left the message w/ the pt's father on 01/10/21   Chronic diastolic CHF: not acutely exacerbated. Continue bumex, carvedilol, hydralazine and imdur    Persistent a. fib: continue on carvedilol & resarted eliquis as per cardio. Cardio following and recs apprec   HTN: continue carvedilol, hydralazine and imdur   Thrombocytopenia: unclear etiology, possible secondary to portal hypertension from known hepatitis C   Obesity: BMI 34.4. Complicates overall care and prognosis   Discharge Instructions  Discharge Instructions     Diet - low sodium heart healthy   Complete by: As directed    Renal diet   Discharge instructions   Complete by: As directed    F/u w/ PCP in 1-2 weeks. F/u w/ cardio at [facility] in 1 week. F/u w/ nephro at [facility] in 1-2 weeks   Discharge wound care:   Complete by: As directed    Cleanse LLE wound with No rinse spray. Place a folded Xeroform gauze over the area, then a square foam dressing.  Beginning behind the toes, spiral wrap a 4 inch ace bandage up the leg. Change daily   Increase activity slowly   Complete by: As directed       Allergies as of 01/07/2021       Reactions   Cyclobenzaprine Nausea Only   Clonidine Other (See Comments)   Asymptomatic bradycardia to 38   Furosemide Other (See Comments)   Caused  renal failure   Tylenol [acetaminophen] Other (See Comments)   Reaction:  Pt states it bothers his liver        Medication List     STOP taking these medications    amLODipine 10 MG tablet Commonly known as: NORVASC   aspirin 81 MG EC tablet   irbesartan 75 MG tablet Commonly known as: AVAPRO       TAKE these medications    albuterol 108 (90 Base) MCG/ACT inhaler Commonly known as: VENTOLIN HFA Inhale 2 puffs into the lungs every 6 (six) hours as needed for wheezing or shortness of breath.   apixaban 5 MG Tabs tablet Commonly known as: Eliquis Take 1 tablet (5 mg total) by mouth 2 (two) times daily. What changed:  medication strength how much to take   atorvastatin 40 MG tablet Commonly known as: LIPITOR Take 1 tablet (40 mg total) by mouth at bedtime.   Auryxia 1 GM 210 MG(Fe) tablet Generic drug: ferric citrate Take 210 mg by mouth See admin instructions. Take 1 tablet ('210mg'$ ) by mouth three times daily with meals  and take 1 tablet ('210mg'$ ) by mouth with snacks   bumetanide 1 MG tablet Commonly known as: BUMEX Take 1 mg by mouth See admin instructions. Take 1 mg by mouth twice daily on non-dialysis days   carvedilol 6.25 MG tablet Commonly known as: COREG Take 6.25 mg by mouth 2 (two) times daily.   [employer] Senna 8.6 MG tablet Generic drug: senna Take 2 tablets by mouth daily as needed.   dextromethorphan-guaiFENesin 30-600 MG 12hr tablet Commonly known as: MUCINEX DM Take 1 tablet by mouth 2 (two) times daily as needed for cough.   isosorbide mononitrate 60 MG 24 hr tablet Commonly known as: IMDUR Take 1 tablet (60 mg total) by mouth daily.   ondansetron 4 MG tablet Commonly known as: ZOFRAN Take 1 tablet (4 mg total) by mouth every 6 (six) hours as needed for nausea.   oxyCODONE 20 mg 12 hr tablet Commonly known as: OXYCONTIN Take 20 mg by mouth every 12 (twelve) hours.   oxyCODONE 5 MG immediate release tablet Commonly known as: Oxy  IR/ROXICODONE Take 5-10 mg by mouth every 6 (six) hours as needed for severe pain.   pantoprazole 40 MG tablet Commonly known as: Protonix Take 1 tablet (40 mg total) by mouth daily.   polyethylene glycol-electrolytes 420 g solution Commonly known as: NuLYTELY Take 17 g by mouth daily as needed (constipation).   sevelamer carbonate 800 MG tablet Commonly known as: RENVELA Take 1,600 mg by mouth 3 (three) times daily with meals.   tamsulosin 0.4 MG Caps capsule Commonly known as: FLOMAX Take 0.4 mg by mouth in the morning and at bedtime.       ASK your doctor about these medications    hydrALAZINE 100 MG tablet Commonly known as: APRESOLINE Take 1 tablet (100 mg total) by mouth 3 (three) times daily.               Discharge Care Instructions  (From admission, onward)           Start     Ordered   01/07/21 0000  Discharge wound care:       Comments: Cleanse LLE wound with No rinse spray. Place a folded Xeroform gauze over the area, then a square foam dressing.  Beginning behind the toes, spiral wrap a 4 inch ace bandage up the leg. Change daily   01/07/21 1458            Allergies  Allergen Reactions   Cyclobenzaprine Nausea Only   Clonidine Other (See Comments)    Asymptomatic bradycardia to 38   Furosemide Other (See Comments)    Caused renal failure   Tylenol [Acetaminophen] Other (See Comments)    Reaction:  Pt states it bothers his liver    Consultations: Cardio, Dr. Rockey Situ    Procedures/Studies: DG Chest 2 View  Result Date: 01/05/2021 CLINICAL DATA:  Short shortness of breath EXAM: CHEST - 2 VIEW COMPARISON:  09/13/2020 FINDINGS: Cardiomegaly. No confluent opacities, effusions or edema. No acute bony abnormality. IMPRESSION: Cardiomegaly.  No active disease. Electronically Signed   By: Rolm Baptise M.D.   On: 01/05/2021 02:52   CT Chest Wo Contrast  Result Date: 01/05/2021 CLINICAL DATA:  Chest pain, end-stage renal disease on  hemodialysis, atrial fibrillation EXAM: CT CHEST WITHOUT CONTRAST TECHNIQUE: Multidetector CT imaging of the chest was performed following the standard protocol without IV contrast. COMPARISON:  01/02/2020 FINDINGS: Cardiovascular: There is extensive multi-vessel coronary artery calcification present, similar to that noted on prior examination.  Mild global cardiomegaly. Trace pericardial fluid. The central pulmonary arteries are enlarged in keeping with changes of pulmonary artery hypertension. Mild atherosclerotic calcification is seen within the thoracic aorta. No aortic aneurysm. Mediastinum/Nodes: The visualized thyroid is unremarkable. No pathologic thoracic adenopathy. The esophagus is unremarkable. Lungs/Pleura: Minimal biapical centrilobular emphysema. Lungs are otherwise clear. No pleural effusion or pneumothorax. The central airways are widely patent. Upper Abdomen: The kidneys are atrophic in keeping with the given history of chronic renal failure. No acute abnormality within the visualized upper abdomen. Musculoskeletal: No acute bone abnormality. No lytic or blastic bone lesion. IMPRESSION: Extensive multi-vessel coronary artery calcification. Mild global cardiomegaly. Morphologic changes in keeping with pulmonary arterial hypertension. Minimal biapical centrilobular emphysema. Aortic Atherosclerosis (ICD10-I70.0) and Emphysema (ICD10-J43.9). Electronically Signed   By: Fidela Salisbury MD   On: 01/05/2021 06:32   NM Myocar Multi W/Spect W/Wall Motion / EF  Result Date: 01/06/2021  There was no ST segment deviation noted during stress.  No T wave inversion was noted during stress.  This is a low risk study.  The left ventricular ejection fraction is mildly decreased (46%).  There is significant perfusion defects on both stress and rest images which appear artefactual (motion, diaphragmatic).  There is no clear evidence for ischemia    US Venous Img Lower Unilateral Left (DVT)  Result Date:  01/05/2021 CLINICAL DATA:  Lower extremity pain and edema EXAM: LEFT LOWER EXTREMITY VENOUS DUPLEX ULTRASOUND TECHNIQUE: Gray-scale sonography with graded compression, as well as color Doppler and duplex ultrasound were performed to evaluate the left lower extremity deep venous system from the level of the common femoral vein and including the common femoral, femoral, profunda femoral, popliteal and calf veins including the posterior tibial, peroneal and gastrocnemius veins when visible. The superficial great saphenous vein was also interrogated. Spectral Doppler was utilized to evaluate flow at rest and with distal augmentation maneuvers in the common femoral, femoral and popliteal veins. COMPARISON:  February 19, 2014 FINDINGS: Contralateral Common Femoral Vein: Respiratory phasicity is normal and symmetric with the symptomatic side. No evidence of thrombus. Normal compressibility. Common Femoral Vein: No evidence of thrombus. Normal compressibility, respiratory phasicity and response to augmentation. Saphenofemoral Junction: No evidence of thrombus. Normal compressibility and flow on color Doppler imaging. Profunda Femoral Vein: No evidence of thrombus. Normal compressibility and flow on color Doppler imaging. Femoral Vein: No evidence of thrombus. Normal compressibility, respiratory phasicity and response to augmentation. Popliteal Vein: No evidence of thrombus. Normal compressibility, respiratory phasicity and response to augmentation. Calf Veins: No evidence of thrombus. Normal compressibility and flow on color Doppler imaging. Superficial Great Saphenous Vein: No evidence of thrombus. Normal compressibility. Venous Reflux:  None. Other Findings:  None. IMPRESSION: No evidence of deep venous thrombosis in the left lower extremity. Right common femoral vein also patent. Electronically Signed   By: Lowella Grip III M.D.   On: 01/05/2021 09:13   ECHOCARDIOGRAM COMPLETE  Result Date: 01/05/2021     ECHOCARDIOGRAM REPORT   Patient Name:   [name] Date of Exam: 01/05/2021 Medical Rec #:  [id number]         Height:       72.0 in Accession #:    [id number]        Weight:       254.0 lb Date of Birth:  1956/04/13         BSA:          2.358 m Patient Age:    70 years  BP:           169/119 mmHg Patient Gender: M                 HR:           94 bpm. Exam Location:  [facility] Procedure: 2D Echo, Color Doppler, Cardiac Doppler and Strain Analysis Indications:     R07.9 Chest Pain  History:         Patient has prior history of Echocardiogram examinations, most                  recent 09/29/2019. Non-obstructive CAD, ESRD; Risk                  Factors:Hypertension.  Sonographer:     Charmayne Sheer RDCS (AE) Referring Phys:  [id number] Rise Mu Diagnosing Phys: Nelva Bush MD  Sonographer Comments: Suboptimal subcostal window. Global longitudinal strain was attempted. IMPRESSIONS  1. Left ventricular ejection fraction, by estimation, is 40 to 45%. The left ventricle has mildly decreased function. The left ventricle demonstrates global hypokinesis. The left ventricular internal cavity size was mildly dilated. There is mild left ventricular hypertrophy. Left ventricular diastolic parameters are indeterminate. The average left ventricular global longitudinal strain is -8.9 %. The global longitudinal strain is abnormal.  2. Right ventricular systolic function is moderately reduced. The right ventricular size is moderately enlarged.  3. Left atrial size was moderately dilated.  4. Right atrial size was mildly dilated.  5. A small pericardial effusion is present. The pericardial effusion is posterior to the left ventricle.  6. The mitral valve is degenerative. Mild mitral valve regurgitation.  7. The aortic valve has an indeterminant number of cusps. There is mild calcification of the aortic valve. There is severe thickening of the aortic valve. Aortic valve regurgitation is mild. Mild to moderate aortic valve  sclerosis/calcification is present, without any evidence of aortic stenosis.  8. Aortic dilatation noted. There is moderate dilatation of the aortic root, measuring 47 mm. There is moderate-severe dilatation of the ascending aorta, measuring 49 mm.  9. The inferior vena cava is dilated in size with <50% respiratory variability, suggesting right atrial pressure of 15 mmHg. FINDINGS  Left Ventricle: Left ventricular ejection fraction, by estimation, is 40 to 45%. The left ventricle has mildly decreased function. The left ventricle demonstrates global hypokinesis. The average left ventricular global longitudinal strain is -8.9 %. The  global longitudinal strain is abnormal. The left ventricular internal cavity size was mildly dilated. There is mild left ventricular hypertrophy. Left ventricular diastolic parameters are indeterminate. Right Ventricle: The right ventricular size is moderately enlarged. No increase in right ventricular wall thickness. Right ventricular systolic function is moderately reduced. Left Atrium: Left atrial size was moderately dilated. Right Atrium: Right atrial size was mildly dilated. Pericardium: A small pericardial effusion is present. The pericardial effusion is posterior to the left ventricle. Mitral Valve: The mitral valve is degenerative in appearance. There is mild thickening of the mitral valve leaflet(s). There is mild calcification of the mitral valve leaflet(s). Mild mitral valve regurgitation. MV peak gradient, 5.1 mmHg. The mean mitral valve gradient is 2.0 mmHg. Tricuspid Valve: The tricuspid valve is normal in structure. Tricuspid valve regurgitation is trivial. Aortic Valve: The aortic valve has an indeterminant number of cusps. There is mild calcification of the aortic valve. There is severe thickening of the aortic valve. Aortic valve regurgitation is mild. Mild to moderate aortic valve sclerosis/calcification is present, without any evidence of aortic  stenosis. Aortic valve  mean gradient measures 9.0 mmHg. Aortic valve peak gradient measures 15.2 mmHg. Aortic valve area, by VTI measures 3.01 cm. Pulmonic Valve: The pulmonic valve was not well visualized. Pulmonic valve regurgitation is not visualized. No evidence of pulmonic stenosis. Aorta: Aortic dilatation noted. There is moderate dilatation of the aortic root, measuring 47 mm. There is moderate-severe dilatation of the ascending aorta, measuring 49 mm. Pulmonary Artery: The pulmonary artery is not well seen. Venous: The inferior vena cava is dilated in size with less than 50% respiratory variability, suggesting right atrial pressure of 15 mmHg. IAS/Shunts: The interatrial septum was not well visualized.  LEFT VENTRICLE PLAX 2D LVIDd:         5.70 cm  Diastology LVIDs:         4.50 cm  LV e' medial:    5.66 cm/s LV PW:         1.30 cm  LV E/e' medial:  17.4 LV IVS:        1.20 cm  LV e' lateral:   7.07 cm/s LVOT diam:     2.60 cm  LV E/e' lateral: 14.0 LV SV:         102 LV SV Index:   43       2D Longitudinal Strain LVOT Area:     5.31 cm 2D Strain GLS Avg:     -8.9 %  RIGHT VENTRICLE RV Basal diam:  4.20 cm LEFT ATRIUM            Index       RIGHT ATRIUM           Index LA diam:      5.10 cm  2.16 cm/m  RA Area:     23.80 cm LA Vol (A2C): 102.0 ml 43.25 ml/m RA Volume:   73.80 ml  31.29 ml/m LA Vol (A4C): 93.2 ml  39.52 ml/m  AORTIC VALVE                    PULMONIC VALVE AV Area (Vmax):    2.78 cm     PV Vmax:       0.96 m/s AV Area (Vmean):   2.59 cm     PV Vmean:      66.500 cm/s AV Area (VTI):     3.01 cm     PV VTI:        0.165 m AV Vmax:           195.00 cm/s  PV Peak grad:  3.7 mmHg AV Vmean:          142.000 cm/s PV Mean grad:  2.0 mmHg AV VTI:            0.339 m AV Peak Grad:      15.2 mmHg AV Mean Grad:      9.0 mmHg LVOT Vmax:         102.00 cm/s LVOT Vmean:        69.200 cm/s LVOT VTI:          0.192 m LVOT/AV VTI ratio: 0.57  AORTA Ao Root diam: 4.70 cm MITRAL VALVE MV Area (PHT): 3.90 cm    SHUNTS MV Area  VTI:   4.51 cm    Systemic VTI:  0.19 m MV Peak grad:  5.1 mmHg    Systemic Diam: 2.60 cm MV Mean grad:  2.0 mmHg MV Vmax:       1.13 m/s MV Vmean:  72.5 cm/s MV Decel Time: 194 msec MV E velocity: 98.77 cm/s Nelva Bush MD Electronically signed by Nelva Bush MD Signature Date/Time: 01/05/2021/4:52:40 PM    Final    CT Angio Chest/Abd/Pel for Dissection W and/or W/WO  Result Date: 01/05/2021 CLINICAL DATA:  65 year old male with chest and back pain. Concern for aortic dissection. EXAM: CT ANGIOGRAPHY CHEST, ABDOMEN AND PELVIS TECHNIQUE: Non-contrast CT of the chest was initially obtained. Multidetector CT imaging through the chest, abdomen and pelvis was performed using the standard protocol during bolus administration of intravenous contrast. Multiplanar reconstructed images and MIPs were obtained and reviewed to evaluate the vascular anatomy. CONTRAST:  180m OMNIPAQUE IOHEXOL 350 MG/ML SOLN COMPARISON:  Chest CT dated 01/05/2021 and CT abdomen pelvis dated 01/02/2020. FINDINGS: Evaluation is limited due to respiratory motion artifact as well as streak artifact caused by patient's arms and left hip arthroplasty. CTA CHEST FINDINGS Cardiovascular: There is mild cardiomegaly. There is a small pericardial effusion measuring 11 mm in thickness. Three vessel coronary vascular calcification. Moderate atherosclerotic calcification of the thoracic aorta. No aneurysmal dilatation or dissection. The origins of the great vessels of the aortic arch appear patent as visualized. Evaluation of the pulmonary arteries is very limited due to respiratory motion artifact and suboptimal opacification and timing of the contrast. No central pulmonary artery embolus identified. Mediastinum/Nodes: There is no hilar or mediastinal adenopathy. The esophagus is grossly unremarkable. No mediastinal fluid collection. Lungs/Pleura: Mild centrilobular emphysema. The lungs are clear. There is no pleural effusion or pneumothorax.  The central airways patent. Musculoskeletal: Osteopenia with degenerative changes of the spine. No acute osseous pathology. Review of the MIP images confirms the above findings. CTA ABDOMEN AND PELVIS FINDINGS VASCULAR Aorta: Advanced aortoiliac atherosclerotic disease. No dissection. 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 Follow-up as per recommendation of prior CT. No periaortic fluid collection. Celiac: The celiac axis and its major branches are patent. SMA: Atherosclerotic calcification of the origin of the SMA. The SMA remains patent. Renals: Atherosclerotic calcification of the renal arteries. The renal arteries are patent. IMA: Patent without evidence of aneurysm, dissection, vasculitis or significant stenosis. Inflow: Patent without evidence of aneurysm, dissection, vasculitis or significant stenosis. Veins: No obvious venous abnormality within the limitations of this arterial phase study. Review of the MIP images confirms the above findings. NON-VASCULAR No intra-abdominal free air. Trace free fluid in the pelvis. Hepatobiliary: Slight irregularity of the liver contour may represent early changes of cirrhosis. Clinical correlation is recommended. No intrahepatic biliary dilatation. The gallbladder is unremarkable. Pancreas: Unremarkable. No pancreatic ductal dilatation or surrounding inflammatory changes. Spleen: Normal in size without focal abnormality. Adrenals/Urinary Tract: The adrenal glands unremarkable. Atrophic kidneys. Similar appearance of nodular soft tissue in the interpolar right kidney which may represent renal parenchyma. A 12 mm hypodense lesion from the superior pole of the left kidney is not characterized on this CT. There is no hydronephrosis on either side. The urinary bladder is grossly unremarkable. Stomach/Bowel: There is sigmoid diverticulosis without active inflammatory changes. There is no bowel obstruction or active inflammation. The appendix is not visualized with  certainty. No inflammatory changes identified in the right lower quadrant. Lymphatic: No adenopathy. Reproductive: The prostate gland is poorly visualized. Other: There is diffuse subcutaneous edema and anasarca. Musculoskeletal: Osteopenia with degenerative changes of the spine. Total left hip arthroplasty. No acute osseous pathology. Review of the MIP images confirms the above findings. IMPRESSION: 1. No acute intrathoracic, abdominal, or pelvic pathology. No CT evidence of aortic dissection. 2.  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 Follow-up as per recommendation of prior CT. 3. Mild cardiomegaly with a small pericardial effusion. 4. Aortic Atherosclerosis (ICD10-I70.0) and Emphysema (ICD10-J43.9). Electronically Signed   By: Anner [place] M.D.   On: 01/05/2021 19:59      Subjective: Pt c/o fatigue    Discharge Exam: Vitals:   01/07/21 0805 01/07/21 1140  BP: 137/83 121/65  Pulse: 94 66  Resp:    Temp: 98.4 F (36.9 C) 97.9 F (36.6 C)  SpO2: 98% 100%   Vitals:   01/06/21 1917 01/07/21 0419 01/07/21 0805 01/07/21 1140  BP: (!) 143/80 117/69 137/83 121/65  Pulse: 78 84 94 66  Resp: 17 20    Temp: 98 F (36.7 C) 98 F (36.7 C) 98.4 F (36.9 C) 97.9 F (36.6 C)  TempSrc: Oral  Oral Oral  SpO2: 99% 97% 98% 100%  Weight:  116.1 kg    Height:        General: Pt is alert, awake, not in acute distress Cardiovascular: S1/S2 +, no rubs, no gallops Respiratory: CTA bilaterally, no wheezing, no rhonchi Abdominal: Soft, NT, ND, bowel sounds + Extremities: no cyanosis    The results of significant diagnostics from this hospitalization (including imaging, microbiology, ancillary and laboratory) are listed below for reference.     Microbiology: Recent Results (from the past 240 hour(s))  Resp Panel by RT-PCR (Flu A&B, Covid) Nasopharyngeal Swab     Status: None   Collection Time: 01/05/21  5:21 AM   Specimen: Nasopharyngeal Swab; Nasopharyngeal(NP) swabs in vial transport  medium  Result Value Ref Range Status   SARS Coronavirus 2 by RT PCR NEGATIVE NEGATIVE Final    Comment: (NOTE) SARS-CoV-2 target nucleic acids are NOT DETECTED.  The SARS-CoV-2 RNA is generally detectable in upper respiratory specimens during the acute phase of infection. The lowest concentration of SARS-CoV-2 viral copies this assay can detect is 138 copies/mL. A negative result does not preclude SARS-Cov-2 infection and should not be used as the sole basis for treatment or other patient management decisions. A negative result may occur with  improper specimen collection/handling, submission of specimen other than nasopharyngeal swab, presence of viral mutation(s) within the areas targeted by this assay, and inadequate number of viral copies(<138 copies/mL). A negative result must be combined with clinical observations, patient history, and epidemiological information. The expected result is Negative.  Fact Sheet for Patients:  EntrepreneurPulse.com.au  Fact Sheet for Healthcare Providers:  IncredibleEmployment.be  This test is no t yet approved or cleared by the [place] FDA and  has been authorized for detection and/or diagnosis of SARS-CoV-2 by FDA under an Emergency Use Authorization (EUA). This EUA will remain  in effect (meaning this test can be used) for the duration of the COVID-19 declaration under Section 564(b)(1) of the Act, 21 U.S.C.section 360bbb-3(b)(1), unless the authorization is terminated  or revoked sooner.       Influenza A by PCR NEGATIVE NEGATIVE Final   Influenza B by PCR NEGATIVE NEGATIVE Final    Comment: (NOTE) The Xpert Xpress SARS-CoV-2/FLU/RSV plus assay is intended as an aid in the diagnosis of influenza from Nasopharyngeal swab specimens and should not be used as a sole basis for treatment. Nasal washings and aspirates are unacceptable for Xpert Xpress SARS-CoV-2/FLU/RSV testing.  Fact Sheet for  Patients: EntrepreneurPulse.com.au  Fact Sheet for Healthcare Providers: IncredibleEmployment.be  This test is not yet approved or cleared by the [place] FDA and has been authorized for detection  and/or diagnosis of SARS-CoV-2 by FDA under an Emergency Use Authorization (EUA). This EUA will remain in effect (meaning this test can be used) for the duration of the COVID-19 declaration under Section 564(b)(1) of the Act, 21 U.S.C. section 360bbb-3(b)(1), unless the authorization is terminated or revoked.  Performed at [facility], [address] [place], [place] [address]   Aerobic/Anaerobic Culture w Gram Stain (surgical/deep wound)     Status: None (Preliminary result)   Collection Time: 01/05/21  5:21 AM   Specimen: Leg; Wound  Result Value Ref Range Status   Specimen Description   Final    LEG LEFT Performed at [facility], [address], [place], [place] [address]    Special Requests   Final    Normal Performed at [facility], [address] [place], [place] [address]    Gram Stain   Final    RARE WBC PRESENT, PREDOMINANTLY PMN MODERATE GRAM POSITIVE COCCI IN PAIRS IN CLUSTERS    Culture   Final    FEW PROTEUS MIRABILIS CULTURE REINCUBATED FOR BETTER GROWTH HOLDING FOR POSSIBLE ANAEROBE Performed at [facility], [place] [address] [place], [place] [address]    Report Status PENDING  Incomplete   Organism ID, Bacteria PROTEUS MIRABILIS  Final      Susceptibility   Proteus mirabilis - MIC*    AMPICILLIN <=2 SENSITIVE Sensitive     CEFAZOLIN <=4 SENSITIVE Sensitive     CEFEPIME <=0.12 SENSITIVE Sensitive     CEFTAZIDIME <=1 SENSITIVE Sensitive     CEFTRIAXONE <=0.25 SENSITIVE Sensitive     CIPROFLOXACIN <=0.25 SENSITIVE Sensitive     GENTAMICIN <=1 SENSITIVE Sensitive     IMIPENEM 2 SENSITIVE Sensitive     TRIMETH/SULFA <=20 SENSITIVE Sensitive     AMPICILLIN/SULBACTAM <=2 SENSITIVE  Sensitive     PIP/TAZO <=4 SENSITIVE Sensitive     * FEW PROTEUS MIRABILIS     Labs: BNP (last 3 results) Recent Labs    01/05/21 0234  BNP [id number]*   Basic Metabolic Panel: Recent Labs  Lab 01/05/21 0234 01/05/21 1059 01/06/21 0713 01/07/21 0800  NA 138  --  138 137  K 3.8  --  4.4 4.1  CL 100  --  100 99  CO2 27  --  26 28  GLUCOSE 117*  --  99 102*  BUN 31*  --  36* 24*  CREATININE 7.08* 7.31* 7.24* 5.65*  CALCIUM 8.2*  --  8.8* 8.9  PHOS  --   --   --  4.3   Liver Function Tests: Recent Labs  Lab 01/05/21 0234  AST 15  ALT 13  ALKPHOS 77  BILITOT 1.0  PROT 6.8  ALBUMIN 3.4*   No results for input(s): LIPASE, AMYLASE in the last 168 hours. No results for input(s): AMMONIA in the last 168 hours. CBC: Recent Labs  Lab 01/05/21 0234 01/06/21 0713 01/07/21 0800  WBC 5.4 6.5 5.9  HGB 9.1* 9.1* 9.3*  HCT 27.0* 27.0* 28.6*  MCV 87.1 86.8 90.2  PLT 100* 100* 97*   Cardiac Enzymes: No results for input(s): CKTOTAL, CKMB, CKMBINDEX, TROPONINI in the last 168 hours. BNP: Invalid input(s): POCBNP CBG: No results for input(s): GLUCAP in the last 168 hours. D-Dimer No results for input(s): DDIMER in the last 72 hours. Hgb A1c No results for input(s): HGBA1C in the last 72 hours. Lipid Profile No results for input(s): CHOL, HDL, LDLCALC, TRIG, CHOLHDL, LDLDIRECT in the last 72 hours. Thyroid  function studies No results for input(s): TSH, T4TOTAL, T3FREE, THYROIDAB in the last 72 hours.  Invalid input(s): FREET3 Anemia work up No results for input(s): VITAMINB12, FOLATE, FERRITIN, TIBC, IRON, RETICCTPCT in the last 72 hours. Urinalysis    Component Value Date/Time   COLORURINE YELLOW (A) 08/20/2018 0230   APPEARANCEUR CLOUDY (A) 08/20/2018 0230   APPEARANCEUR Clear 11/08/2014 1559   LABSPEC 1.010 08/20/2018 0230   LABSPEC 1.010 11/08/2014 1559   PHURINE 9.0 (H) 08/20/2018 0230   GLUCOSEU NEGATIVE 08/20/2018 0230   GLUCOSEU Negative 11/08/2014 1559    HGBUR NEGATIVE 08/20/2018 0230   BILIRUBINUR NEGATIVE 08/20/2018 0230   BILIRUBINUR Negative 11/08/2014 1559   KETONESUR NEGATIVE 08/20/2018 0230   PROTEINUR 100 (A) 08/20/2018 0230   UROBILINOGEN 1.0 10/28/2010 1100   NITRITE POSITIVE (A) 08/20/2018 0230   LEUKOCYTESUR LARGE (A) 08/20/2018 0230   LEUKOCYTESUR Negative 11/08/2014 1559   Sepsis Labs Invalid input(s): PROCALCITONIN,  WBC,  LACTICIDVEN Microbiology Recent Results (from the past 240 hour(s))  Resp Panel by RT-PCR (Flu A&B, Covid) Nasopharyngeal Swab     Status: None   Collection Time: 01/05/21  5:21 AM   Specimen: Nasopharyngeal Swab; Nasopharyngeal(NP) swabs in vial transport medium  Result Value Ref Range Status   SARS Coronavirus 2 by RT PCR NEGATIVE NEGATIVE Final    Comment: (NOTE) SARS-CoV-2 target nucleic acids are NOT DETECTED.  The SARS-CoV-2 RNA is generally detectable in upper respiratory specimens during the acute phase of infection. The lowest concentration of SARS-CoV-2 viral copies this assay can detect is 138 copies/mL. A negative result does not preclude SARS-Cov-2 infection and should not be used as the sole basis for treatment or other patient management decisions. A negative result may occur with  improper specimen collection/handling, submission of specimen other than nasopharyngeal swab, presence of viral mutation(s) within the areas targeted by this assay, and inadequate number of viral copies(<138 copies/mL). A negative result must be combined with clinical observations, patient history, and epidemiological information. The expected result is Negative.  Fact Sheet for Patients:  EntrepreneurPulse.com.au  Fact Sheet for Healthcare Providers:  IncredibleEmployment.be  This test is no t yet approved or cleared by the [place] FDA and  has been authorized for detection and/or diagnosis of SARS-CoV-2 by FDA under an Emergency Use Authorization (EUA).  This EUA will remain  in effect (meaning this test can be used) for the duration of the COVID-19 declaration under Section 564(b)(1) of the Act, 21 U.S.C.section 360bbb-3(b)(1), unless the authorization is terminated  or revoked sooner.       Influenza A by PCR NEGATIVE NEGATIVE Final   Influenza B by PCR NEGATIVE NEGATIVE Final    Comment: (NOTE) The Xpert Xpress SARS-CoV-2/FLU/RSV plus assay is intended as an aid in the diagnosis of influenza from Nasopharyngeal swab specimens and should not be used as a sole basis for treatment. Nasal washings and aspirates are unacceptable for Xpert Xpress SARS-CoV-2/FLU/RSV testing.  Fact Sheet for Patients: EntrepreneurPulse.com.au  Fact Sheet for Healthcare Providers: IncredibleEmployment.be  This test is not yet approved or cleared by the [place] FDA and has been authorized for detection and/or diagnosis of SARS-CoV-2 by FDA under an Emergency Use Authorization (EUA). This EUA will remain in effect (meaning this test can be used) for the duration of the COVID-19 declaration under Section 564(b)(1) of the Act, 21 U.S.C. section 360bbb-3(b)(1), unless the authorization is terminated or revoked.  Performed at [facility], [address] [place], [place] [address]   Aerobic/Anaerobic Culture  w Gram Stain (surgical/deep wound)     Status: None (Preliminary result)   Collection Time: 01/05/21  5:21 AM   Specimen: Leg; Wound  Result Value Ref Range Status   Specimen Description   Final    LEG LEFT Performed at [facility], [address] [place], [place] [address]    Special Requests   Final    Normal Performed at [facility], [address] [place], [place] [address]    Gram Stain   Final    RARE WBC PRESENT, PREDOMINANTLY PMN MODERATE GRAM POSITIVE COCCI IN PAIRS IN CLUSTERS    Culture   Final    FEW PROTEUS MIRABILIS CULTURE REINCUBATED FOR BETTER  GROWTH HOLDING FOR POSSIBLE ANAEROBE Performed at [facility], [place] [address] [place], [place] [address]    Report Status PENDING  Incomplete   Organism ID, Bacteria PROTEUS MIRABILIS  Final      Susceptibility   Proteus mirabilis - MIC*    AMPICILLIN <=2 SENSITIVE Sensitive     CEFAZOLIN <=4 SENSITIVE Sensitive     CEFEPIME <=0.12 SENSITIVE Sensitive     CEFTAZIDIME <=1 SENSITIVE Sensitive     CEFTRIAXONE <=0.25 SENSITIVE Sensitive     CIPROFLOXACIN <=0.25 SENSITIVE Sensitive     GENTAMICIN <=1 SENSITIVE Sensitive     IMIPENEM 2 SENSITIVE Sensitive     TRIMETH/SULFA <=20 SENSITIVE Sensitive     AMPICILLIN/SULBACTAM <=2 SENSITIVE Sensitive     PIP/TAZO <=4 SENSITIVE Sensitive     * FEW PROTEUS MIRABILIS     Time coordinating discharge: Over 30 minutes  SIGNED:   Wyvonnia Dusky, MD  [facility] 01/07/2021, 3:00 PM Pager   If 7PM-7AM, please contact night-coverage [email or web address]

## 2021-01-10 LAB — AEROBIC/ANAEROBIC CULTURE W GRAM STAIN (SURGICAL/DEEP WOUND): Special Requests: NORMAL

## 2021-03-13 ENCOUNTER — Other Ambulatory Visit: Payer: Self-pay

## 2021-03-13 DIAGNOSIS — I2510 Atherosclerotic heart disease of native coronary artery without angina pectoris: Secondary | ICD-10-CM | POA: Diagnosis not present

## 2021-03-13 DIAGNOSIS — Z7901 Long term (current) use of anticoagulants: Secondary | ICD-10-CM | POA: Diagnosis not present

## 2021-03-13 DIAGNOSIS — I5032 Chronic diastolic (congestive) heart failure: Secondary | ICD-10-CM | POA: Diagnosis not present

## 2021-03-13 DIAGNOSIS — G894 Chronic pain syndrome: Secondary | ICD-10-CM | POA: Insufficient documentation

## 2021-03-13 DIAGNOSIS — X58XXXA Exposure to other specified factors, initial encounter: Secondary | ICD-10-CM | POA: Diagnosis not present

## 2021-03-13 DIAGNOSIS — F17210 Nicotine dependence, cigarettes, uncomplicated: Secondary | ICD-10-CM | POA: Insufficient documentation

## 2021-03-13 DIAGNOSIS — Z96649 Presence of unspecified artificial hip joint: Secondary | ICD-10-CM | POA: Insufficient documentation

## 2021-03-13 DIAGNOSIS — Z992 Dependence on renal dialysis: Secondary | ICD-10-CM | POA: Insufficient documentation

## 2021-03-13 DIAGNOSIS — S81802A Unspecified open wound, left lower leg, initial encounter: Secondary | ICD-10-CM | POA: Insufficient documentation

## 2021-03-13 DIAGNOSIS — I132 Hypertensive heart and chronic kidney disease with heart failure and with stage 5 chronic kidney disease, or end stage renal disease: Secondary | ICD-10-CM | POA: Insufficient documentation

## 2021-03-13 DIAGNOSIS — Z79899 Other long term (current) drug therapy: Secondary | ICD-10-CM | POA: Diagnosis not present

## 2021-03-13 DIAGNOSIS — N186 End stage renal disease: Secondary | ICD-10-CM | POA: Insufficient documentation

## 2021-03-13 DIAGNOSIS — S8992XA Unspecified injury of left lower leg, initial encounter: Secondary | ICD-10-CM | POA: Diagnosis present

## 2021-03-13 LAB — CBC WITH DIFFERENTIAL/PLATELET
Abs Immature Granulocytes: 0.04 10*3/uL (ref 0.00–0.07)
Basophils Absolute: 0.1 10*3/uL (ref 0.0–0.1)
Basophils Relative: 1 %
Eosinophils Absolute: 0.2 10*3/uL (ref 0.0–0.5)
Eosinophils Relative: 3 %
HCT: 26.9 % — ABNORMAL LOW (ref 39.0–52.0)
Hemoglobin: 8.9 g/dL — ABNORMAL LOW (ref 13.0–17.0)
Immature Granulocytes: 1 %
Lymphocytes Relative: 18 %
Lymphs Abs: 1 10*3/uL (ref 0.7–4.0)
MCH: 29.7 pg (ref 26.0–34.0)
MCHC: 33.1 g/dL (ref 30.0–36.0)
MCV: 89.7 fL (ref 80.0–100.0)
Monocytes Absolute: 0.6 10*3/uL (ref 0.1–1.0)
Monocytes Relative: 11 %
Neutro Abs: 3.8 10*3/uL (ref 1.7–7.7)
Neutrophils Relative %: 66 %
Platelets: 109 10*3/uL — ABNORMAL LOW (ref 150–400)
RBC: 3 MIL/uL — ABNORMAL LOW (ref 4.22–5.81)
RDW: 16.2 % — ABNORMAL HIGH (ref 11.5–15.5)
WBC: 5.7 10*3/uL (ref 4.0–10.5)
nRBC: 0 % (ref 0.0–0.2)

## 2021-03-13 LAB — COMPREHENSIVE METABOLIC PANEL
ALT: 23 U/L (ref 0–44)
AST: 21 U/L (ref 15–41)
Albumin: 3.3 g/dL — ABNORMAL LOW (ref 3.5–5.0)
Alkaline Phosphatase: 99 U/L (ref 38–126)
Anion gap: 9 (ref 5–15)
BUN: 37 mg/dL — ABNORMAL HIGH (ref 8–23)
CO2: 27 mmol/L (ref 22–32)
Calcium: 8.2 mg/dL — ABNORMAL LOW (ref 8.9–10.3)
Chloride: 101 mmol/L (ref 98–111)
Creatinine, Ser: 7.69 mg/dL — ABNORMAL HIGH (ref 0.61–1.24)
GFR, Estimated: 7 mL/min — ABNORMAL LOW (ref >60)
Glucose, Bld: 93 mg/dL (ref 70–99)
Potassium: 3.5 mmol/L (ref 3.5–5.1)
Sodium: 137 mmol/L (ref 135–145)
Total Bilirubin: 1.1 mg/dL (ref 0.3–1.2)
Total Protein: 6.8 g/dL (ref 6.5–8.1)

## 2021-03-13 LAB — D-DIMER, QUANTITATIVE: D-Dimer, Quant: 1.13 ug/mL-FEU — ABNORMAL HIGH (ref 0.00–0.50)

## 2021-03-13 NOTE — ED Triage Notes (Addendum)
Patient c/o lower left leg swelling/pain/redness beginning Friday. Patient reports PMH of PE.

## 2021-03-14 ENCOUNTER — Emergency Department: Payer: Medicare Other

## 2021-03-14 ENCOUNTER — Emergency Department
Admission: EM | Admit: 2021-03-14 | Discharge: 2021-03-14 | Disposition: A | Discharge status: Home / Self Care | Payer: Medicare Other | Attending: Emergency Medicine | Admitting: Emergency Medicine

## 2021-03-14 DIAGNOSIS — S81802A Unspecified open wound, left lower leg, initial encounter: Secondary | ICD-10-CM

## 2021-03-14 DIAGNOSIS — G894 Chronic pain syndrome: Secondary | ICD-10-CM

## 2021-03-14 MED ORDER — OXYCODONE HCL ER 10 MG PO T12A
20.0000 mg | EXTENDED_RELEASE_TABLET | Freq: Once | ORAL | Status: AC
  Administered 2021-03-14: 20 mg via ORAL
  Filled 2021-03-14: qty 2

## 2021-03-14 MED ORDER — HYDROMORPHONE HCL 2 MG PO TABS
2.0000 mg | ORAL_TABLET | Freq: Once | ORAL | Status: AC
Start: 2021-03-14 — End: 2021-03-14
  Administered 2021-03-14: 2 mg via ORAL
  Filled 2021-03-14: qty 1

## 2021-03-14 MED ORDER — MORPHINE SULFATE (PF) 4 MG/ML IV SOLN
8.0000 mg | Freq: Once | INTRAVENOUS | Status: AC
  Administered 2021-03-14: 8 mg via INTRAMUSCULAR
  Filled 2021-03-14: qty 2

## 2021-03-14 MED ORDER — ACETAMINOPHEN 500 MG PO TABS
1000.0000 mg | ORAL_TABLET | Freq: Once | ORAL | Status: AC
  Administered 2021-03-14: 1000 mg via ORAL
  Filled 2021-03-14: qty 2

## 2021-03-14 NOTE — Discharge Instructions (Addendum)
Continue all of your chronic pain medications at home, continue dialysis as scheduled, and getting home wound care.   If you develop any fevers, uncontrolled pain, spreading red rash around that wound, please return to the ED.

## 2021-03-14 NOTE — ED Provider Notes (Signed)
[facility] Emergency Department Provider Note ____________________________________________   Event Date/Time   First MD Initiated Contact with Patient 03/14/21 0732     (approximate)  I have reviewed the triage vital signs and the nursing notes.  HISTORY  Chief Complaint Leg Pain   HPI [name] is a 65 y.o. malewho presents to the ED for evaluation of left leg pain.  Chart review indicates ESRD on hemodialysis MWF, paroxysmal A. fib with history of PE, HTN, chronic opiate therapy for back pain. [facility] healthcare medical admission 3 weeks ago due to left lower extremity wound attributed to venous insufficiency and chronic venous stasis dermatitis.  No evidence of calciphylaxis or infectious pathology.  Eventually discharged with wound care at home.  Patient presents to the ED for evaluation of acute on chronic pain to his left leg at this wound.  He reports has had this wound here for at least 2 months.  He reports taking OxyContin 20 mg and oxycodone 5 mg on top of this for his chronic pain syndrome.  Reports last taking his medications yesterday.   He reports compliance with his dialysis, last being dialyzed on Friday and completing the session.  He reports the dialysis nurse told him to "get that wound checked out because it looks pretty bad."  He reports wound care coming to his home every 2 days or so, and he has been compliant with dressing changes and wound care.  He denies any fever, additional trauma or injuries. He denies chest pain or shortness of breath. Reports tolerating p.o. and toileting at his baseline. He is anticoagulated on Eliquis and reports compliance with his medication.  Past Medical History:  Diagnosis Date   Depression    Diastolic dysfunction    a. 09/2019 Echo: EF 55-60%, no rwma, mod LVH, gr2 DD. Nl RV size/fxn. Mildly dil LA. Ao root 4.5cm.   Dilated aortic root ([place])    a. 09/2019 Echo: Ao root 4.5cm.   Elevated  troponin level not due myocardial infarction    ESRD (end stage renal disease) ([place])    Hypertension    Nonobstructive Coronary Artery Disease    a. 09/2019 Cath: LM nl, LAD min irregs, D1 nl, D2 min irregs, LCX large, min irregs, RCA large, 20p, 30p/m.   PE (pulmonary embolism)    Renal insufficiency     Patient Active Problem List   Diagnosis Date Noted   Persistent atrial fibrillation (HCC)    HFrEF (heart failure with reduced ejection fraction) (HCC)    Chest pain 01/05/2021   Cough    CAD (coronary artery disease) 01/05/2020   Chronic anticoagulation 01/05/2020   Acute bronchitis 01/05/2020   AF (paroxysmal atrial fibrillation) ([place]) 01/05/2020   History of substance abuse ([place]) 10/24/2019   Elevated troponin 10/24/2019   Nonspecific chest pain 10/24/2019   Chronic diastolic CHF (congestive heart failure) ([place]) 09/28/2019   History of MI (myocardial infarction) 09/28/2019   Anemia of chronic kidney failure, stage 5 ([place]) 09/28/2019   History of pulmonary embolism 09/28/2019   Chronic midline low back pain    Acute respiratory failure with hypoxia (HCC)    Acute diastolic CHF (congestive heart failure) (HCC)    Anemia of chronic disease    Thrombocytopenia (HCC)    Congestive heart failure (HCC)    Fluid overload 06/21/2019   Volume overload 11/15/2018   ESRD (end stage renal disease) on dialysis ([place]) 11/14/2018   HCAP (healthcare-associated pneumonia) 08/27/2018   SOB (shortness  of breath) 08/25/2018   Chest pain of uncertain etiology [id number]   ESRD on dialysis [facility]) 11/08/2016   Prostate cancer screening 11/08/2016   Acquired cyst of kidney 11/08/2016   Urinary urgency 11/08/2016   Acute on chronic renal failure ([place]) 09/23/2016   Hypertensive urgency 09/21/2016   Dysthymia 02/26/2016   Chronic pain 02/26/2016   Noncompliance with renal dialysis ([place]) 02/26/2016   Opiate abuse, continuous ([place]) 02/03/2016   Substance induced mood disorder ([place]) 02/03/2016    Antisocial personality disorder ([place]) 02/03/2016   Left-sided weakness 01/18/2016   HEPATITIS C 06/28/2008   HLD (hyperlipidemia) 06/28/2008   SUBSTANCE ABUSE, MULTIPLE 06/28/2008   Hypertension 06/27/2008    Past Surgical History:  Procedure Laterality Date   LEFT HEART CATH AND CORONARY ANGIOGRAPHY N/A 09/30/2019   Procedure: LEFT HEART CATH AND CORONARY ANGIOGRAPHY;  Surgeon: Wellington Hampshire, MD;  Location: [facility];  Service: Cardiovascular;  Laterality: N/A;   TOTAL HIP ARTHROPLASTY      Prior to Admission medications   Medication Sig Start Date End Date Taking? Authorizing Provider  albuterol (VENTOLIN HFA) 108 (90 Base) MCG/ACT inhaler Inhale 2 puffs into the lungs every 6 (six) hours as needed for wheezing or shortness of breath. 01/16/20   Blake Divine, MD  apixaban (ELIQUIS) 5 MG TABS tablet Take 1 tablet (5 mg total) by mouth 2 (two) times daily. 01/07/21 02/06/21  Wyvonnia Dusky, MD  atorvastatin (LIPITOR) 40 MG tablet Take 1 tablet (40 mg total) by mouth at bedtime. 10/24/19 01/05/21  Max Sane, MD  AURYXIA 1 GM 210 MG(Fe) tablet Take 210 mg by mouth See admin instructions. Take 1 tablet ('210mg'$ ) by mouth three times daily with meals and take 1 tablet ('210mg'$ ) by mouth with snacks 06/28/19   [provider]  bumetanide (BUMEX) 1 MG tablet Take 1 mg by mouth See admin instructions. Take 1 mg by mouth twice daily on non-dialysis days 10/12/20   [provider]  carvedilol (COREG) 6.25 MG tablet Take 6.25 mg by mouth 2 (two) times daily. 10/12/20   [provider]  [employer] SENNA 8.6 MG tablet Take 2 tablets by mouth daily as needed. 10/12/20   [provider]  dextromethorphan-guaiFENesin (MUCINEX DM) 30-600 MG 12hr tablet Take 1 tablet by mouth 2 (two) times daily as needed for cough. 01/06/20   Lorella Nimrod, MD  hydrALAZINE (APRESOLINE) 100 MG tablet Take 1 tablet (100 mg total) by mouth 3 (three) times daily. Patient taking differently:  Take 100 mg by mouth 2 (two) times daily. 06/23/19   Jennye Boroughs, MD  isosorbide mononitrate (IMDUR) 60 MG 24 hr tablet Take 1 tablet (60 mg total) by mouth daily. 10/24/19   Max Sane, MD  ondansetron (ZOFRAN) 4 MG tablet Take 1 tablet (4 mg total) by mouth every 6 (six) hours as needed for nausea. 10/24/19   Max Sane, MD  oxyCODONE (OXY IR/ROXICODONE) 5 MG immediate release tablet Take 5-10 mg by mouth every 6 (six) hours as needed for severe pain.    [provider]  oxyCODONE (OXYCONTIN) 20 mg 12 hr tablet Take 20 mg by mouth every 12 (twelve) hours.    [provider]  pantoprazole (PROTONIX) 40 MG tablet Take 1 tablet (40 mg total) by mouth daily. 10/24/19   Max Sane, MD  polyethylene glycol-electrolytes (NULYTELY) 420 g solution Take 17 g by mouth daily as needed (constipation). 09/15/20   [provider]  sevelamer carbonate (RENVELA) 800 MG tablet Take 1,600  mg by mouth 3 (three) times daily with meals. 06/10/20 06/10/21  [provider]  tamsulosin (FLOMAX) 0.4 MG CAPS capsule Take 0.4 mg by mouth in the morning and at bedtime. 10/13/20   [provider]    Allergies Cyclobenzaprine, Clonidine, Furosemide, and Tylenol [acetaminophen]  Family History  Problem Relation Age of Onset   Hypertension Mother     Social History Social History   Tobacco Use   Smoking status: Every Day    Packs/day: 0.50    Types: Cigarettes   Smokeless tobacco: Never  Substance Use Topics   Alcohol use: No   Drug use: No    Review of Systems  Constitutional: No fever/chills Eyes: No visual changes. ENT: No sore throat. Cardiovascular: Denies chest pain. Respiratory: Denies shortness of breath. Gastrointestinal: No abdominal pain.  No nausea, no vomiting.  No diarrhea.  No constipation. Genitourinary: Negative for dysuria. Musculoskeletal: Negative for back pain. Positive for acute on chronic atraumatic left leg pain at the site of a chronic  wound. Skin: Negative for rash. Neurological: Negative for headaches, focal weakness or numbness.  ____________________________________________   PHYSICAL EXAM:  VITAL SIGNS: Vitals:   03/14/21 0830 03/14/21 0900  BP: (!) 152/90 127/79  Pulse: 85 78  Resp: 16 17  Temp:    SpO2: 100% 97%    Constitutional: Alert and oriented. Well appearing and in no acute distress. Eyes: Conjunctivae are normal. PERRL. EOMI. Head: Atraumatic. Nose: No congestion/rhinnorhea. Mouth/Throat: Mucous membranes are moist.  Oropharynx non-erythematous. Neck: No stridor. No cervical spine tenderness to palpation. Cardiovascular: Normal rate, regular rhythm. Grossly normal heart sounds.  Good peripheral circulation. Respiratory: Normal respiratory effort.  No retractions. Lungs CTAB. Gastrointestinal: Soft , nondistended, nontender to palpation. No CVA tenderness. Musculoskeletal: No joint effusions.  Chronic venous stasis dermatitis to bilateral lower extremities symmetrically. Approximately 3 cm in diameter circular ulcerative lesion to the medial aspect of the distal left shin, as pictured below.  No surrounding erythema, induration or purulence.  Does have good granulation tissue to the wound base.  No fluctuance. Neurologic:  Normal speech and language. No gross focal neurologic deficits are appreciated.  Skin:  Skin is warm, dry and intact. No rash noted. Psychiatric: Mood and affect are normal. Speech and behavior are normal.    ____________________________________________   LABS (all labs ordered are listed, but only abnormal results are displayed)  Labs Reviewed  CBC WITH DIFFERENTIAL/PLATELET - Abnormal; Notable for the following components:      Result Value   RBC 3.00 (*)    Hemoglobin 8.9 (*)    HCT 26.9 (*)    RDW 16.2 (*)    Platelets 109 (*)    All other components within normal limits  COMPREHENSIVE METABOLIC PANEL - Abnormal; Notable for the following components:   BUN 37  (*)    Creatinine, Ser 7.69 (*)    Calcium 8.2 (*)    Albumin 3.3 (*)    GFR, Estimated 7 (*)    All other components within normal limits  D-DIMER, QUANTITATIVE - Abnormal; Notable for the following components:   D-Dimer, Quant 1.13 (*)    All other components within normal limits   ____________________________________________  12 Lead EKG   ____________________________________________  RADIOLOGY  ED MD interpretation:    Official radiology report(s): US Venous Img Lower Unilateral Left  Result Date: 03/14/2021 CLINICAL DATA:  Left lower extremity swelling EXAM: LEFT LOWER EXTREMITY VENOUS DOPPLER ULTRASOUND TECHNIQUE: Gray-scale sonography with compression, as well as color and  duplex ultrasound, were performed to evaluate the deep venous system(s) from the level of the common femoral vein through the popliteal and proximal calf veins. COMPARISON:  None. FINDINGS: VENOUS Normal compressibility of the common femoral, superficial femoral, and popliteal veins, as well as the visualized calf veins. Visualized portions of profunda femoral vein and great saphenous vein unremarkable. No filling defects to suggest DVT on grayscale or color Doppler imaging. Doppler waveforms show normal direction of venous flow, normal respiratory plasticity and response to augmentation. Limited views of the contralateral common femoral vein are unremarkable. OTHER None. Limitations: none IMPRESSION: Negative. Electronically Signed   By: Rolm Baptise M.D.   On: 03/14/2021 00:58    ____________________________________________   PROCEDURES and INTERVENTIONS  Procedure(s) performed (including Critical Care):  .1-3 Lead EKG Interpretation  Date/Time: 03/14/2021 10:25 AM Performed by: Vladimir Crofts, MD Authorized by: Vladimir Crofts, MD     Interpretation: normal     ECG rate:  74   ECG rate assessment: normal     Rhythm: sinus rhythm     Ectopy: none     Conduction: normal    Medications  oxyCODONE  (OXYCONTIN) 12 hr tablet 20 mg (20 mg Oral Given 03/14/21 0828)  HYDROmorphone (DILAUDID) tablet 2 mg (2 mg Oral Given 03/14/21 0828)  acetaminophen (TYLENOL) tablet 1,000 mg (1,000 mg Oral Given 03/14/21 0828)  morphine 4 MG/ML injection 8 mg (8 mg Intramuscular Given 03/14/21 0939)    ____________________________________________   MDM / ED COURSE   65 year old male presents to the ED with acute on chronic pain to his chronic wound, without evidence of superimposed infection or acute pathology, and amenable to outpatient management.  Normal vitals on room air.  Patient has a chronic ulcerative wound to his left leg, pictured above, but without evidence of superimposed infection.  No evidence of DVT to the affected extremity.  He reports compliance with his Eliquis.  Blood work is reassuring without evidence of superimposed infection or significant electrolyte derangements.  ESRD stigmata is noted.  For some reason a D-dimer was sent from triage, but there is no indication to screen for acute PE considering his compliance with Eliquis and lack of cardiopulmonary symptoms.  No indications for further diagnostics here in the ED.  We will provide pain control, wound care and outpatient management.  Return precautions were discussed.  Clinical Course as of 03/14/21 1024  Sun Mar 14, 2021  [id number] Discussed plan of care with the patient and recommendations for pain control and the possibility of outpatient management thereafter.  He is in agreement. [DS]  [id number] Reassessed.  Patient reports some improvement pain, but is requesting additional analgesia.  We discussed 1 additional medication for his pain, wound care and likely outpatient management.  He is in agreement. [DS]  1020 Patient sitting up on the side of the bed and looks well.  He is complaining that he is not getting more Dilaudid.  I advised him with appropriate analgesic regimen and no indications for IV Dilaudid at this time. [DS]    Clinical Course  User Index [DS] Vladimir Crofts, MD    ____________________________________________   FINAL CLINICAL IMPRESSION(S) / ED DIAGNOSES  Final diagnoses:  Wound of left lower extremity, initial encounter  Chronic pain syndrome     ED Discharge Orders     None        Santo Zahradnik   Note:  This document was prepared using Dragon voice recognition software and may include unintentional dictation errors.  Vladimir Crofts, MD 03/14/21 1026

## 2021-03-14 NOTE — ED Notes (Signed)
Patient given saltine crackers and ginger ale. Patient calling family to find ride home.

## 2021-06-15 ENCOUNTER — Other Ambulatory Visit: Payer: Self-pay

## 2021-06-15 ENCOUNTER — Encounter: Payer: Self-pay | Admitting: Internal Medicine

## 2021-06-15 ENCOUNTER — Emergency Department: Payer: Medicare Other

## 2021-06-15 ENCOUNTER — Inpatient Hospital Stay
Admission: EM | Admit: 2021-06-15 | Discharge: 2021-06-17 | DRG: 640 | Disposition: A | Discharge status: Home / Self Care | Payer: Medicare Other | Attending: Obstetrics and Gynecology | Admitting: Obstetrics and Gynecology

## 2021-06-15 DIAGNOSIS — F17210 Nicotine dependence, cigarettes, uncomplicated: Secondary | ICD-10-CM | POA: Diagnosis present

## 2021-06-15 DIAGNOSIS — Z886 Allergy status to analgesic agent status: Secondary | ICD-10-CM

## 2021-06-15 DIAGNOSIS — I8510 Secondary esophageal varices without bleeding: Secondary | ICD-10-CM | POA: Diagnosis present

## 2021-06-15 DIAGNOSIS — Z79899 Other long term (current) drug therapy: Secondary | ICD-10-CM

## 2021-06-15 DIAGNOSIS — I10 Essential (primary) hypertension: Secondary | ICD-10-CM | POA: Diagnosis present

## 2021-06-15 DIAGNOSIS — E8779 Other fluid overload: Secondary | ICD-10-CM | POA: Diagnosis not present

## 2021-06-15 DIAGNOSIS — D6959 Other secondary thrombocytopenia: Secondary | ICD-10-CM | POA: Diagnosis present

## 2021-06-15 DIAGNOSIS — R0602 Shortness of breath: Secondary | ICD-10-CM

## 2021-06-15 DIAGNOSIS — Z8249 Family history of ischemic heart disease and other diseases of the circulatory system: Secondary | ICD-10-CM

## 2021-06-15 DIAGNOSIS — Z86711 Personal history of pulmonary embolism: Secondary | ICD-10-CM

## 2021-06-15 DIAGNOSIS — E8770 Fluid overload, unspecified: Principal | ICD-10-CM | POA: Diagnosis present

## 2021-06-15 DIAGNOSIS — Z992 Dependence on renal dialysis: Secondary | ICD-10-CM

## 2021-06-15 DIAGNOSIS — Z72811 Adult antisocial behavior: Secondary | ICD-10-CM

## 2021-06-15 DIAGNOSIS — I5032 Chronic diastolic (congestive) heart failure: Secondary | ICD-10-CM | POA: Diagnosis present

## 2021-06-15 DIAGNOSIS — J9601 Acute respiratory failure with hypoxia: Secondary | ICD-10-CM | POA: Diagnosis present

## 2021-06-15 DIAGNOSIS — I8500 Esophageal varices without bleeding: Secondary | ICD-10-CM

## 2021-06-15 DIAGNOSIS — N400 Enlarged prostate without lower urinary tract symptoms: Secondary | ICD-10-CM | POA: Diagnosis present

## 2021-06-15 DIAGNOSIS — I5082 Biventricular heart failure: Secondary | ICD-10-CM | POA: Diagnosis present

## 2021-06-15 DIAGNOSIS — Z96649 Presence of unspecified artificial hip joint: Secondary | ICD-10-CM | POA: Diagnosis present

## 2021-06-15 DIAGNOSIS — N186 End stage renal disease: Secondary | ICD-10-CM | POA: Diagnosis present

## 2021-06-15 DIAGNOSIS — I25118 Atherosclerotic heart disease of native coronary artery with other forms of angina pectoris: Secondary | ICD-10-CM | POA: Diagnosis not present

## 2021-06-15 DIAGNOSIS — F1120 Opioid dependence, uncomplicated: Secondary | ICD-10-CM | POA: Diagnosis present

## 2021-06-15 DIAGNOSIS — F1911 Other psychoactive substance abuse, in remission: Secondary | ICD-10-CM | POA: Diagnosis present

## 2021-06-15 DIAGNOSIS — N2581 Secondary hyperparathyroidism of renal origin: Secondary | ICD-10-CM | POA: Diagnosis present

## 2021-06-15 DIAGNOSIS — Z7901 Long term (current) use of anticoagulants: Secondary | ICD-10-CM | POA: Diagnosis not present

## 2021-06-15 DIAGNOSIS — D696 Thrombocytopenia, unspecified: Secondary | ICD-10-CM

## 2021-06-15 DIAGNOSIS — I4819 Other persistent atrial fibrillation: Secondary | ICD-10-CM | POA: Diagnosis present

## 2021-06-15 DIAGNOSIS — I5020 Unspecified systolic (congestive) heart failure: Secondary | ICD-10-CM

## 2021-06-15 DIAGNOSIS — Z888 Allergy status to other drugs, medicaments and biological substances status: Secondary | ICD-10-CM

## 2021-06-15 DIAGNOSIS — E785 Hyperlipidemia, unspecified: Secondary | ICD-10-CM | POA: Diagnosis present

## 2021-06-15 DIAGNOSIS — L97829 Non-pressure chronic ulcer of other part of left lower leg with unspecified severity: Secondary | ICD-10-CM | POA: Diagnosis present

## 2021-06-15 DIAGNOSIS — K7460 Unspecified cirrhosis of liver: Secondary | ICD-10-CM | POA: Diagnosis present

## 2021-06-15 DIAGNOSIS — I132 Hypertensive heart and chronic kidney disease with heart failure and with stage 5 chronic kidney disease, or end stage renal disease: Secondary | ICD-10-CM | POA: Diagnosis present

## 2021-06-15 DIAGNOSIS — Z9115 Patient's noncompliance with renal dialysis: Secondary | ICD-10-CM

## 2021-06-15 DIAGNOSIS — R0600 Dyspnea, unspecified: Secondary | ICD-10-CM

## 2021-06-15 DIAGNOSIS — F1994 Other psychoactive substance use, unspecified with psychoactive substance-induced mood disorder: Secondary | ICD-10-CM | POA: Diagnosis present

## 2021-06-15 DIAGNOSIS — Z20822 Contact with and (suspected) exposure to covid-19: Secondary | ICD-10-CM | POA: Diagnosis present

## 2021-06-15 DIAGNOSIS — I2510 Atherosclerotic heart disease of native coronary artery without angina pectoris: Secondary | ICD-10-CM | POA: Diagnosis present

## 2021-06-15 DIAGNOSIS — E875 Hyperkalemia: Secondary | ICD-10-CM | POA: Diagnosis present

## 2021-06-15 DIAGNOSIS — F32A Depression, unspecified: Secondary | ICD-10-CM | POA: Diagnosis present

## 2021-06-15 DIAGNOSIS — D631 Anemia in chronic kidney disease: Secondary | ICD-10-CM | POA: Diagnosis present

## 2021-06-15 DIAGNOSIS — R079 Chest pain, unspecified: Secondary | ICD-10-CM

## 2021-06-15 LAB — COMPREHENSIVE METABOLIC PANEL
ALT: 18 U/L (ref 0–44)
AST: 20 U/L (ref 15–41)
Albumin: 3.3 g/dL — ABNORMAL LOW (ref 3.5–5.0)
Alkaline Phosphatase: 96 U/L (ref 38–126)
Anion gap: 14 (ref 5–15)
BUN: 78 mg/dL — ABNORMAL HIGH (ref 8–23)
CO2: 24 mmol/L (ref 22–32)
Calcium: 8.3 mg/dL — ABNORMAL LOW (ref 8.9–10.3)
Chloride: 100 mmol/L (ref 98–111)
Creatinine, Ser: 10.49 mg/dL — ABNORMAL HIGH (ref 0.61–1.24)
GFR, Estimated: 5 mL/min — ABNORMAL LOW (ref >60)
Glucose, Bld: 95 mg/dL (ref 70–99)
Potassium: 5.5 mmol/L — ABNORMAL HIGH (ref 3.5–5.1)
Sodium: 138 mmol/L (ref 135–145)
Total Bilirubin: 1 mg/dL (ref 0.3–1.2)
Total Protein: 7.3 g/dL (ref 6.5–8.1)

## 2021-06-15 LAB — CBC WITH DIFFERENTIAL/PLATELET
Abs Immature Granulocytes: 0.02 10*3/uL (ref 0.00–0.07)
Basophils Absolute: 0.1 10*3/uL (ref 0.0–0.1)
Basophils Relative: 1 %
Eosinophils Absolute: 0.1 10*3/uL (ref 0.0–0.5)
Eosinophils Relative: 2 %
HCT: 28.5 % — ABNORMAL LOW (ref 39.0–52.0)
Hemoglobin: 9.1 g/dL — ABNORMAL LOW (ref 13.0–17.0)
Immature Granulocytes: 0 %
Lymphocytes Relative: 24 %
Lymphs Abs: 1.3 10*3/uL (ref 0.7–4.0)
MCH: 28.5 pg (ref 26.0–34.0)
MCHC: 31.9 g/dL (ref 30.0–36.0)
MCV: 89.3 fL (ref 80.0–100.0)
Monocytes Absolute: 0.5 10*3/uL (ref 0.1–1.0)
Monocytes Relative: 9 %
Neutro Abs: 3.5 10*3/uL (ref 1.7–7.7)
Neutrophils Relative %: 64 %
Platelets: 104 10*3/uL — ABNORMAL LOW (ref 150–400)
RBC: 3.19 MIL/uL — ABNORMAL LOW (ref 4.22–5.81)
RDW: 17 % — ABNORMAL HIGH (ref 11.5–15.5)
WBC: 5.5 10*3/uL (ref 4.0–10.5)
nRBC: 0 % (ref 0.0–0.2)

## 2021-06-15 LAB — BRAIN NATRIURETIC PEPTIDE: B Natriuretic Peptide: >4500 pg/mL — ABNORMAL HIGH (ref 0.0–100.0)

## 2021-06-15 LAB — RESP PANEL BY RT-PCR (FLU A&B, COVID) ARPGX2
Influenza A by PCR: NEGATIVE
Influenza B by PCR: NEGATIVE
SARS Coronavirus 2 by RT PCR: NEGATIVE

## 2021-06-15 LAB — PROCALCITONIN: Procalcitonin: 0.3 ng/mL

## 2021-06-15 LAB — MAGNESIUM: Magnesium: 2.4 mg/dL (ref 1.7–2.4)

## 2021-06-15 LAB — PHOSPHORUS: Phosphorus: 8.6 mg/dL — ABNORMAL HIGH (ref 2.5–4.6)

## 2021-06-15 LAB — TROPONIN I (HIGH SENSITIVITY): Troponin I (High Sensitivity): 97 ng/L — ABNORMAL HIGH (ref <18)

## 2021-06-15 MED ORDER — FUROSEMIDE 10 MG/ML IJ SOLN: 40.0000 mg | Freq: Once | INTRAMUSCULAR | Status: DC

## 2021-06-15 MED ORDER — APIXABAN 5 MG PO TABS
5.0000 mg | ORAL_TABLET | Freq: Two times a day (BID) | ORAL | Status: DC
  Administered 2021-06-15 – 2021-06-17 (×5): 5 mg via ORAL
  Filled 2021-06-15 (×6): qty 1

## 2021-06-15 MED ORDER — FERRIC CITRATE 1 GM 210 MG(FE) PO TABS
210.0000 mg | ORAL_TABLET | Freq: Three times a day (TID) | ORAL | Status: DC
  Administered 2021-06-16 – 2021-06-17 (×4): 210 mg via ORAL
  Filled 2021-06-15 (×6): qty 1

## 2021-06-15 MED ORDER — MORPHINE SULFATE (PF) 2 MG/ML IV SOLN: 2.0000 mg | INTRAVENOUS | Status: DC | PRN

## 2021-06-15 MED ORDER — FUROSEMIDE 10 MG/ML IJ SOLN: 40.0000 mg | Freq: Every day | INTRAMUSCULAR | Status: DC

## 2021-06-15 MED ORDER — OXYCODONE HCL 5 MG PO TABS
10.0000 mg | ORAL_TABLET | ORAL | Status: DC | PRN
  Administered 2021-06-15 – 2021-06-17 (×8): 10 mg via ORAL
  Filled 2021-06-15 (×7): qty 2

## 2021-06-15 MED ORDER — ACETAMINOPHEN 500 MG PO TABS
ORAL_TABLET | ORAL | Status: AC
  Filled 2021-06-15: qty 2

## 2021-06-15 MED ORDER — FERRIC CITRATE 1 GM 210 MG(FE) PO TABS: 210.0000 mg | ORAL_TABLET | ORAL | Status: DC

## 2021-06-15 MED ORDER — ISOSORBIDE MONONITRATE ER 30 MG PO TB24
60.0000 mg | ORAL_TABLET | Freq: Every day | ORAL | Status: DC
  Administered 2021-06-16: 60 mg via ORAL
  Filled 2021-06-15: qty 1

## 2021-06-15 MED ORDER — FERRIC CITRATE 1 GM 210 MG(FE) PO TABS
210.0000 mg | ORAL_TABLET | ORAL | Status: DC
  Filled 2021-06-15 (×3): qty 1

## 2021-06-15 MED ORDER — HYDRALAZINE HCL 50 MG PO TABS
100.0000 mg | ORAL_TABLET | Freq: Every day | ORAL | Status: DC
  Administered 2021-06-16: 100 mg via ORAL
  Filled 2021-06-15: qty 2

## 2021-06-15 MED ORDER — DM-GUAIFENESIN ER 30-600 MG PO TB12: 1.0000 | ORAL_TABLET | Freq: Two times a day (BID) | ORAL | Status: DC | PRN

## 2021-06-15 MED ORDER — FENTANYL CITRATE PF 50 MCG/ML IJ SOSY
50.0000 ug | PREFILLED_SYRINGE | Freq: Once | INTRAMUSCULAR | Status: AC
  Administered 2021-06-15: 50 ug via INTRAVENOUS
  Filled 2021-06-15: qty 1

## 2021-06-15 MED ORDER — ALBUTEROL SULFATE (2.5 MG/3ML) 0.083% IN NEBU: 2.5000 mg | INHALATION_SOLUTION | Freq: Four times a day (QID) | RESPIRATORY_TRACT | Status: DC | PRN

## 2021-06-15 MED ORDER — MORPHINE SULFATE (PF) 4 MG/ML IV SOLN
4.0000 mg | INTRAVENOUS | Status: AC | PRN
  Administered 2021-06-15 – 2021-06-16 (×3): 4 mg via INTRAVENOUS
  Filled 2021-06-15 (×3): qty 1

## 2021-06-15 MED ORDER — ACETAMINOPHEN 650 MG RE SUPP
650.0000 mg | Freq: Four times a day (QID) | RECTAL | Status: DC | PRN
  Filled 2021-06-15: qty 1

## 2021-06-15 MED ORDER — SENNA 8.6 MG PO TABS: 2.0000 | ORAL_TABLET | Freq: Every day | ORAL | Status: DC | PRN

## 2021-06-15 MED ORDER — ONDANSETRON HCL 4 MG PO TABS: 4.0000 mg | ORAL_TABLET | Freq: Four times a day (QID) | ORAL | Status: DC | PRN

## 2021-06-15 MED ORDER — ACETAMINOPHEN 500 MG PO TABS: 1000.0000 mg | ORAL_TABLET | Freq: Four times a day (QID) | ORAL | Status: DC | PRN

## 2021-06-15 MED ORDER — ATORVASTATIN CALCIUM 20 MG PO TABS
40.0000 mg | ORAL_TABLET | Freq: Every day | ORAL | Status: DC
  Administered 2021-06-15 – 2021-06-17 (×3): 40 mg via ORAL
  Filled 2021-06-15 (×3): qty 2

## 2021-06-15 MED ORDER — FUROSEMIDE 10 MG/ML IJ SOLN
60.0000 mg | Freq: Once | INTRAMUSCULAR | Status: AC
  Administered 2021-06-15: 60 mg via INTRAVENOUS
  Filled 2021-06-15: qty 8

## 2021-06-15 MED ORDER — ACETAMINOPHEN 325 MG PO TABS
ORAL_TABLET | ORAL | Status: AC
  Filled 2021-06-15: qty 2

## 2021-06-15 MED ORDER — CARVEDILOL 6.25 MG PO TABS
6.2500 mg | ORAL_TABLET | Freq: Two times a day (BID) | ORAL | Status: DC
  Administered 2021-06-15 – 2021-06-17 (×4): 6.25 mg via ORAL
  Filled 2021-06-15 (×4): qty 1

## 2021-06-15 MED ORDER — TAMSULOSIN HCL 0.4 MG PO CAPS
0.4000 mg | ORAL_CAPSULE | Freq: Every day | ORAL | Status: DC
  Administered 2021-06-15 – 2021-06-17 (×3): 0.4 mg via ORAL
  Filled 2021-06-15 (×3): qty 1

## 2021-06-15 MED ORDER — ONDANSETRON HCL 4 MG/2ML IJ SOLN
4.0000 mg | Freq: Four times a day (QID) | INTRAMUSCULAR | Status: DC | PRN
  Administered 2021-06-16: 4 mg via INTRAVENOUS
  Filled 2021-06-15: qty 2

## 2021-06-15 NOTE — ED Provider Notes (Signed)
[facility] Emergency Department Provider Note  Time seen: 5:57 PM  I have reviewed the triage vital signs and the nursing notes.   HISTORY  Chief Complaint Chest Pain and Shortness of Breath   HPI [name] is a 65 y.o. male with a past medical history of CHF, hypertension, ESRD on HD Monday/Wednesday/Friday who presents to the emergency department for chest pain shortness of breath.  According to the patient for the past week or so he has been feeling chest pain and shortness of breath.  States he has not been feeling well enough to go to dialysis and missed his last 2 sessions.  Believes last time he had dialysis was either last Monday or Wednesday.  States shortness of breath has worsened.  Denies any fever denies any increased cough or congestion.  Does state increased lower extremity edema bilaterally.   Past Medical History:  Diagnosis Date   Depression    Diastolic dysfunction    a. 09/2019 Echo: EF 55-60%, no rwma, mod LVH, gr2 DD. Nl RV size/fxn. Mildly dil LA. Ao root 4.5cm.   Dilated aortic root ([place])    a. 09/2019 Echo: Ao root 4.5cm.   Elevated troponin level not due myocardial infarction    ESRD (end stage renal disease) ([place])    Hypertension    Nonobstructive Coronary Artery Disease    a. 09/2019 Cath: LM nl, LAD min irregs, D1 nl, D2 min irregs, LCX large, min irregs, RCA large, 20p, 30p/m.   PE (pulmonary embolism)    Renal insufficiency     Patient Active Problem List   Diagnosis Date Noted   Persistent atrial fibrillation (HCC)    HFrEF (heart failure with reduced ejection fraction) (HCC)    Chest pain 01/05/2021   Cough    CAD (coronary artery disease) 01/05/2020   Chronic anticoagulation 01/05/2020   Acute bronchitis 01/05/2020   AF (paroxysmal atrial fibrillation) ([place]) 01/05/2020   History of substance abuse ([place]) 10/24/2019   Elevated troponin 10/24/2019   Nonspecific chest pain 10/24/2019   Chronic diastolic CHF  (congestive heart failure) ([place]) 09/28/2019   History of MI (myocardial infarction) 09/28/2019   Anemia of chronic kidney failure, stage 5 ([place]) 09/28/2019   History of pulmonary embolism 09/28/2019   Chronic midline low back pain    Acute respiratory failure with hypoxia (HCC)    Acute diastolic CHF (congestive heart failure) (HCC)    Anemia of chronic disease    Thrombocytopenia (HCC)    Congestive heart failure (HCC)    Fluid overload 06/21/2019   Volume overload 11/15/2018   ESRD (end stage renal disease) on dialysis ([place]) 11/14/2018   HCAP (healthcare-associated pneumonia) 08/27/2018   SOB (shortness of breath) 08/25/2018   Chest pain of uncertain etiology 53/97/6734   ESRD on dialysis ([place]) 11/08/2016   Prostate cancer screening 11/08/2016   Acquired cyst of kidney 11/08/2016   Urinary urgency 11/08/2016   Acute on chronic renal failure ([place]) 09/23/2016   Hypertensive urgency 09/21/2016   Dysthymia 02/26/2016   Chronic pain 02/26/2016   Noncompliance with renal dialysis ([place]) 02/26/2016   Opiate abuse, continuous ([place]) 02/03/2016   Substance induced mood disorder ([place]) 02/03/2016   Antisocial personality disorder ([place]) 02/03/2016   Left-sided weakness 01/18/2016   HEPATITIS C 06/28/2008   HLD (hyperlipidemia) 06/28/2008   SUBSTANCE ABUSE, MULTIPLE 06/28/2008   Hypertension 06/27/2008    Past Surgical History:  Procedure Laterality Date   LEFT HEART CATH AND CORONARY ANGIOGRAPHY N/A 09/30/2019  Procedure: LEFT HEART CATH AND CORONARY ANGIOGRAPHY;  Surgeon: Wellington Hampshire, MD;  Location: [place] [facility];  Service: Cardiovascular;  Laterality: N/A;   TOTAL HIP ARTHROPLASTY      Prior to Admission medications   Medication Sig Start Date End Date Taking? Authorizing Provider  albuterol (VENTOLIN HFA) 108 (90 Base) MCG/ACT inhaler Inhale 2 puffs into the lungs every 6 (six) hours as needed for wheezing or shortness of breath. 01/16/20   Blake Divine, MD  apixaban  (ELIQUIS) 5 MG TABS tablet Take 1 tablet (5 mg total) by mouth 2 (two) times daily. 01/07/21 02/06/21  Wyvonnia Dusky, MD  atorvastatin (LIPITOR) 40 MG tablet Take 1 tablet (40 mg total) by mouth at bedtime. 10/24/19 01/05/21  Max Sane, MD  AURYXIA 1 GM 210 MG(Fe) tablet Take 210 mg by mouth See admin instructions. Take 1 tablet (210mg ) by mouth three times daily with meals and take 1 tablet (210mg ) by mouth with snacks 06/28/19   [provider]  bumetanide (BUMEX) 1 MG tablet Take 1 mg by mouth See admin instructions. Take 1 mg by mouth twice daily on non-dialysis days 10/12/20   [provider]  carvedilol (COREG) 6.25 MG tablet Take 6.25 mg by mouth 2 (two) times daily. 10/12/20   [provider]  [facility] SENNA 8.6 MG tablet Take 2 tablets by mouth daily as needed. 10/12/20   [provider]  dextromethorphan-guaiFENesin (MUCINEX DM) 30-600 MG 12hr tablet Take 1 tablet by mouth 2 (two) times daily as needed for cough. 01/06/20   Lorella Nimrod, MD  hydrALAZINE (APRESOLINE) 100 MG tablet Take 1 tablet (100 mg total) by mouth 3 (three) times daily. Patient taking differently: Take 100 mg by mouth 2 (two) times daily. 06/23/19   Jennye Boroughs, MD  isosorbide mononitrate (IMDUR) 60 MG 24 hr tablet Take 1 tablet (60 mg total) by mouth daily. 10/24/19   Max Sane, MD  ondansetron (ZOFRAN) 4 MG tablet Take 1 tablet (4 mg total) by mouth every 6 (six) hours as needed for nausea. 10/24/19   Max Sane, MD  oxyCODONE (OXY IR/ROXICODONE) 5 MG immediate release tablet Take 5-10 mg by mouth every 6 (six) hours as needed for severe pain.    [provider]  oxyCODONE (OXYCONTIN) 20 mg 12 hr tablet Take 20 mg by mouth every 12 (twelve) hours.    [provider]  pantoprazole (PROTONIX) 40 MG tablet Take 1 tablet (40 mg total) by mouth daily. 10/24/19   Max Sane, MD  polyethylene glycol-electrolytes (NULYTELY) 420 g solution Take 17 g by mouth daily as needed  (constipation). 09/15/20   [provider]  tamsulosin (FLOMAX) 0.4 MG CAPS capsule Take 0.4 mg by mouth in the morning and at bedtime. 10/13/20   [provider]    Allergies  Allergen Reactions   Cyclobenzaprine Nausea Only   Clonidine Other (See Comments)    Asymptomatic bradycardia to 38   Furosemide Other (See Comments)    Caused renal failure   Tylenol [Acetaminophen] Other (See Comments)    Reaction:  Pt states it bothers his liver    Family History  Problem Relation Age of Onset   Hypertension Mother     Social History Social History   Tobacco Use   Smoking status: Every Day    Packs/day: 0.50    Types: Cigarettes   Smokeless tobacco: Never  Substance Use Topics   Alcohol use: No   Drug use: No    Review  of Systems Constitutional: Negative for fever. Cardiovascular: Positive for chest pain Respiratory: Positive for shortness of breath Gastrointestinal: Negative for abdominal pain Musculoskeletal: Lower extremity edema Neurological: Negative for headache All other ROS negative  ____________________________________________   PHYSICAL EXAM:  VITAL SIGNS: ED Triage Vitals [06/15/21 1716]  Enc Vitals Group     BP (!) 182/100     Pulse Rate 93     Resp (!) 22     Temp 99 F (37.2 C)     Temp Source Oral     SpO2 99 %     Weight      Height      Head Circumference      Peak Flow      Pain Score      Pain Loc      Pain Edu?      Excl. in [place]?     Constitutional: Alert and oriented.  No acute distress. Eyes: Normal exam ENT      Head: Normocephalic and atraumatic.      Mouth/Throat: Mucous membranes are moist. Cardiovascular: Normal rate, regular rhythm. Respiratory: Mild tachypnea.  Slight rales auscultated bilaterally.  No respiratory distress. Gastrointestinal: Soft and nontender. No distention Musculoskeletal: 3-4+ pitting edema bilaterally with mild weepage bilaterally.  Left upper extremity AV fistula.  Good  thrill. Neurologic:  Normal speech and language. No gross focal neurologic deficits  Skin:  Skin is warm Psychiatric: Mood and affect are normal.   ____________________________________________    EKG  EKG viewed and interpreted by myself shows atrial fibrillation at 88 bpm with a narrow QRS, normal axis slight QTC prolongation otherwise normal intervals with nonspecific but no concerning ST changes.  ____________________________________________    RADIOLOGY  Chest x-ray shows vascular congestion.  ____________________________________________   INITIAL IMPRESSION / ASSESSMENT AND PLAN / ED COURSE  Pertinent labs & imaging results that were available during my care of the patient were reviewed by me and considered in my medical decision making (see chart for details).   Patient presents emergency department for chest pain and shortness of breath.  Patient is dialysis dependent has not had dialysis for approximately 1 week.  Patient is satting 96 to 97% during my evaluation.  Last blood pressure is 134/101.  Patient's labs have resulted showing mild hyperkalemia with a potassium of 5.5 and a BUN of 78.  We will obtain a chest x-ray and discussed the patient with nephrology.  Given the patient's chest pain I have added on a troponin, EKG does not show any concerning findings.  We will also obtain a COVID/flu test.  Patient's work-up shows mild hyperkalemia 5.5, elevated BUN.  Chest x-ray shows vascular congestion.  Patient satting around 95% on room air however continues to feel short of breath.  We placed the patient on 2 L nasal cannula for comfort.  I spoke to nephrology regarding the patient, we will plan to admit to the hospital service and have the patient dialyzed in the morning.  Patient agreeable to plan of care.  [name] was evaluated in Emergency Department on 06/15/2021 for the symptoms described in the history of present illness. He was evaluated in the context of the  global COVID-19 pandemic, which necessitated consideration that the patient might be at risk for infection with the SARS-CoV-2 virus that causes COVID-19. Institutional protocols and algorithms that pertain to the evaluation of patients at risk for COVID-19 are in a state of rapid change based on information released by regulatory bodies including  the [employer] and federal and state organizations. These policies and algorithms were followed during the patient's care in the ED.  ____________________________________________   FINAL CLINICAL IMPRESSION(S) / ED DIAGNOSES  Dyspnea Chest pain Fluid overload   Harvest Dark, MD 06/15/21 2107

## 2021-06-15 NOTE — ED Notes (Signed)
ED Provider at bedside.. referred to nephrologist, planned for HD in AM

## 2021-06-15 NOTE — ED Triage Notes (Signed)
Pt presents to ED with c/o of SOB and chest pain that started yesterday. Pt states he is a dialysis pt and has missed 2 days this week because "I was sick and didn't want to go".  Pt is grunting in triage but is 99% on RA.

## 2021-06-15 NOTE — ED Notes (Addendum)
Pt requesting pain meds at this time. Meds not. Message request sent to pharmacy

## 2021-06-15 NOTE — H&P (Addendum)
History and Physical   [name] IRW:[id number] DOB: 02/09/1956 DOA: 06/15/2021  PCP: [facility] [place]  Patient coming from: Home  I have personally briefly reviewed patient's old medical records in [place].  Chief Concern: Shortness of breath  HPI: [name] is a 65 y.o. male with medical history significant for hypertension, atrial fibrillation, on anticoagulation with Eliquis, end-stage renal disease on hemodialysis, noncompliant with hemodialysis, hyperlipidemia, psychiatric imbalance, history of polysubstance abuse, history of QT prolongation, who presents emergency department for chief concerns of shortness of breath.  He reports that he has not felt well in the last week and therefore has missed 2 hemodialysis sessions. He endorses generalized malaise with cough that is productive of yellow sputum. This was been worsening towards the end of last week, therefore he missed Friday and Monday HD. His last session was on Wednesday 11/30 and he states it was not a full treatment session.   He states the shortness of breath is worse with getting up and moving around around. At baseline he walks with a walker.   He endorses sharp chest pain that has been ongoing for 2-3 days. He states that pain is 9/10 and he denies trauma to his person. He reports he has never had pain like this before. He denies extremity changes.   He denies vision changes, dysphagia, fever, nausea, vomiting, diarrhea.   Social history: He lives with his parents who are in their 24s. He is a former tobacco user, quitting about 1 month. At his peak, he smoked 1/2 packs per day. He denies etoh use, last drink was 20 years ago. He denies recreational drugs. 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   Vaccination history: He is vaccinated for covid 19, including one booster (three dose total)  ROS: Constitutional: no weight change, no fever ENT/Mouth: no sore throat,  no rhinorrhea Eyes: no eye pain, no vision changes Cardiovascular: no chest pain, + dyspnea,  + edema, no palpitations Respiratory: no cough, no sputum, no wheezing Gastrointestinal: no nausea, no vomiting, no diarrhea, no constipation Genitourinary: no urinary incontinence, no dysuria, no hematuria Musculoskeletal: no arthralgias, no myalgias Skin: no skin lesions, no pruritus, Neuro: + weakness, no loss of consciousness, no syncope Psych: no anxiety, no depression, + decrease appetite Heme/Lymph: no bruising, no bleeding  ED Course: Discussed with emergency medicine provider, patient requiring hospitalization for chief concerns of fluid overload.  Vitals in the emergency department was remarkable for temperature of 99, respiration rate of 23, heart rate of 75, initial blood pressure 130/92, SPO2 of 97% on room air.  Labs in the emergency department was remarkable for serum sodium 138, potassium 5.5, chloride 100, bicarb 24, BUN of 78, serum creatinine of 10.49, nonfasting blood glucose 95, WBC 5.5, hemoglobin 9.1, platelets 104.  High sensitive troponin is 97.  COVID/influenza A/influenza B PCR were negative.  In the emergency department patient was given fentanyl 50 mcg IV once, furosemide 60 mg IV once.  Assessment/Plan  Principal Problem:   Volume overload Active Problems:   HLD (hyperlipidemia)   Hypertension   Substance induced mood disorder (HCC)   Noncompliance with renal dialysis ([place])   ESRD on dialysis (HCC)   SOB (shortness of breath)   Thrombocytopenia (HCC)   Chronic diastolic CHF (congestive heart failure) ([place])   History of pulmonary embolism   History of substance abuse (HCC)   CAD (coronary artery disease)   Chronic anticoagulation   Persistent  atrial fibrillation (HCC)   HFrEF (heart failure with reduced ejection fraction) ([place])   # Shortness of breath-presumed secondary to volume overload in setting of missing 2 hemodialysis session # End-stage renal  disease on hemodialysis Monday Wednesday Friday - Check procalcitonin - EDP has consulted nephrology, Dr. Juleen China who states patient will get hemodialysis in the a.m. was 06/16/2021 - Admit to telemetry medical floor, observation - Strict I's and O's - Ordered an additional furosemide 40 mg IV once scheduled for day of admission at 2300 - BiPAP nightly ordered  # Hyperkalemia-presumed secondary to missing hemodialysis session, treat as above - BMP in the a.m.  # Hyperphosphatemia-secondary to missing hemodialysis sessions -Treat as above  # Hyperlipidemia # CAD-atorvastatin 40 mg nightly, Coreg - Patient had a left heart cath on 09/30/2019 which was read as proximal RCA lesion is 20% stenosed, proximal RCA to mid RCA lesion is 30% stenosis - Patient has been prescribed isosorbide mononitrate 60 mg p.o. daily  # Primary hypertension # Heart failure reduced ejection fraction - Resumed home carvedilol 6.25 mg p.o. twice daily - Patient is prescribed hydralazine 100 mg p.o. 3 times daily  # Noncardiac related chest pain-query viral respiratory inducing myalgia - Resumed home oxycodone 10 mg p.o. every 4 hours as needed for moderate pain, 2 days ordered; morphine 4 mg IV every 3 hours as needed for severe pain, 3 doses ordered  # Atrial fibrillation-resumed Eliquis 5 mg p.o. twice daily, Coreg  # Chronic left lower extremity wounds - patient sees wound clinic and he states the last time this was changed was last Thursday, 06/10/21 - Wound care consulted   # BPH-Flomax resumed  # History of psychiatric imbalance # History of antisocial behavior # History of substance abuse  Chart reviewed.   DVT prophylaxis: Eliquis Code Status: Full code Diet: Renal Family Communication: no Disposition Plan: Pending nephrology evaluation/hemodialysis Consults called: Nephrology service for hemodialysis Admission status: Telemetry medical observation  Past Medical History:  Diagnosis Date    Depression    Diastolic dysfunction    a. 09/2019 Echo: EF 55-60%, no rwma, mod LVH, gr2 DD. Nl RV size/fxn. Mildly dil LA. Ao root 4.5cm.   Dilated aortic root ([place])    a. 09/2019 Echo: Ao root 4.5cm.   Elevated troponin level not due myocardial infarction    ESRD (end stage renal disease) ([place])    Hypertension    Nonobstructive Coronary Artery Disease    a. 09/2019 Cath: LM nl, LAD min irregs, D1 nl, D2 min irregs, LCX large, min irregs, RCA large, 20p, 30p/m.   PE (pulmonary embolism)    Renal insufficiency    Past Surgical History:  Procedure Laterality Date   LEFT HEART CATH AND CORONARY ANGIOGRAPHY N/A 09/30/2019   Procedure: LEFT HEART CATH AND CORONARY ANGIOGRAPHY;  Surgeon: Wellington Hampshire, MD;  Location: [place] [facility];  Service: Cardiovascular;  Laterality: N/A;   TOTAL HIP ARTHROPLASTY     Social History:  reports that he has been smoking cigarettes. He has been smoking an average of .5 packs per day. He has never used smokeless tobacco. He reports that he does not drink alcohol and does not use drugs.  Allergies  Allergen Reactions   Cyclobenzaprine Nausea Only   Clonidine Other (See Comments)    Asymptomatic bradycardia to 38   Furosemide Other (See Comments)    Caused renal failure   Tylenol [Acetaminophen] Other (See Comments)    Reaction:  Pt states it bothers his liver  Family History  Problem Relation Age of Onset   Hypertension Mother    Family history: Family history reviewed and pertinent for mother with hypertension.  Prior to Admission medications   Medication Sig Start Date End Date Taking? Authorizing Provider  albuterol (VENTOLIN HFA) 108 (90 Base) MCG/ACT inhaler Inhale 2 puffs into the lungs every 6 (six) hours as needed for wheezing or shortness of breath. 01/16/20  Yes Blake Divine, MD  apixaban (ELIQUIS) 5 MG TABS tablet Take 1 tablet (5 mg total) by mouth 2 (two) times daily. 01/07/21 06/15/21 Yes Wyvonnia Dusky, MD  atorvastatin  (LIPITOR) 40 MG tablet Take 1 tablet (40 mg total) by mouth at bedtime. 10/24/19 06/15/21 Yes Max Sane, MD  AURYXIA 1 GM 210 MG(Fe) tablet Take 210 mg by mouth See admin instructions. Take 1 tablet (210mg ) by mouth three times daily with meals and take 1 tablet (210mg ) by mouth with snacks 06/28/19  Yes [provider]  bumetanide (BUMEX) 1 MG tablet Take 1 mg by mouth See admin instructions. Take 1 mg by mouth twice daily on non-dialysis days 10/12/20  Yes [provider]  carvedilol (COREG) 6.25 MG tablet Take 6.25 mg by mouth 2 (two) times daily. 10/12/20  Yes [provider]  [employer] SENNA 8.6 MG tablet Take 2 tablets by mouth daily as needed. 10/12/20   [provider]  dextromethorphan-guaiFENesin (MUCINEX DM) 30-600 MG 12hr tablet Take 1 tablet by mouth 2 (two) times daily as needed for cough. 01/06/20   Lorella Nimrod, MD  hydrALAZINE (APRESOLINE) 100 MG tablet Take 1 tablet (100 mg total) by mouth 3 (three) times daily. Patient taking differently: Take 100 mg by mouth 2 (two) times daily. 06/23/19   Jennye Boroughs, MD  isosorbide mononitrate (IMDUR) 60 MG 24 hr tablet Take 1 tablet (60 mg total) by mouth daily. 10/24/19   Max Sane, MD  ondansetron (ZOFRAN) 4 MG tablet Take 1 tablet (4 mg total) by mouth every 6 (six) hours as needed for nausea. 10/24/19   Max Sane, MD  oxyCODONE (OXY IR/ROXICODONE) 5 MG immediate release tablet Take 5-10 mg by mouth every 6 (six) hours as needed for severe pain.    [provider]  oxyCODONE (OXYCONTIN) 20 mg 12 hr tablet Take 20 mg by mouth every 12 (twelve) hours.    [provider]  pantoprazole (PROTONIX) 40 MG tablet Take 1 tablet (40 mg total) by mouth daily. 10/24/19   Max Sane, MD  polyethylene glycol-electrolytes (NULYTELY) 420 g solution Take 17 g by mouth daily as needed (constipation). Patient not taking: Reported on 06/15/2021 09/15/20   [provider]  tamsulosin (FLOMAX) 0.4 MG CAPS  capsule Take 0.4 mg by mouth in the morning and at bedtime. 10/13/20   [provider]   Physical Exam: Vitals:   06/15/21 1800 06/15/21 1830 06/15/21 1900 06/15/21 1957  BP: (!) 130/92 131/90 (!) 148/112   Pulse: 75 73 84   Resp: (!) 23 (!) 21 20   Temp:      TempSrc:      SpO2: 97% 97% 97% 100%   Constitutional: appears older than chronological age, chronically ill, NAD, calm, comfortable Eyes: PERRL, lids and conjunctivae normal ENMT: Mucous membranes are moist. Posterior pharynx clear of any exudate or lesions. Age-appropriate dentition. Hearing appropriate Neck: normal, supple, no masses, no thyromegaly Respiratory: clear to auscultation bilaterally, no wheezing, no crackles. Normal respiratory effort. No accessory muscle use.  Cardiovascular: Regular rate and rhythm, no murmurs /  rubs / gallops.  Bilateral lower extremity 2+ pitting edema. 2+ pedal pulses. No carotid bruits.  Abdomen: Obese abdomen, no tenderness, no masses palpated, no hepatosplenomegaly. Bowel sounds positive.  Musculoskeletal: no clubbing / cyanosis. No joint deformity upper and lower extremities. Good ROM, no contractures, no atrophy. Normal muscle tone.  Skin: no rashes, lesions, ulcers. No induration.  Wound is present on the left lower extremity. Neurologic: Sensation intact. Strength 5/5 in all 4.  Psychiatric: Normal judgment and insight. Alert and oriented x 3. Normal mood.   EKG: independently reviewed, showing atrial fibrillation with rate of 88, QTc 513  Chest x-ray on Admission: I personally reviewed and I agree with radiologist reading as below.  DG Chest Portable 1 View  Result Date: 06/15/2021 CLINICAL DATA:  Shortness of breath EXAM: PORTABLE CHEST 1 VIEW COMPARISON:  Chest x-ray 01/05/2021 FINDINGS: Heart is enlarged. Mediastinum appears grossly stable. Calcified plaques in the aortic arch. Pulmonary vascular congestion with no focal consolidation visualized. No significant pleural  effusion. No pneumothorax. IMPRESSION: Cardiomegaly and pulmonary vascular congestion. Electronically Signed   By: Ofilia Neas M.D.   On: 06/15/2021 18:24    Labs on Admission: I have personally reviewed following labs  CBC: Recent Labs  Lab 06/15/21 1724  WBC 5.5  NEUTROABS 3.5  HGB 9.1*  HCT 28.5*  MCV 89.3  PLT 093*   Basic Metabolic Panel: Recent Labs  Lab 06/15/21 1724  NA 138  K 5.5*  CL 100  CO2 24  GLUCOSE 95  BUN 78*  CREATININE 10.49*  CALCIUM 8.3*   GFR: CrCl cannot be calculated (Unknown ideal weight.).  Liver Function Tests: Recent Labs  Lab 06/15/21 1724  AST 20  ALT 18  ALKPHOS 96  BILITOT 1.0  PROT 7.3  ALBUMIN 3.3*   Urine analysis:    Component Value Date/Time   COLORURINE YELLOW (A) 08/20/2018 0230   APPEARANCEUR CLOUDY (A) 08/20/2018 0230   APPEARANCEUR Clear 11/08/2014 1559   LABSPEC 1.010 08/20/2018 0230   LABSPEC 1.010 11/08/2014 1559   PHURINE 9.0 (H) 08/20/2018 0230   GLUCOSEU NEGATIVE 08/20/2018 0230   GLUCOSEU Negative 11/08/2014 1559   HGBUR NEGATIVE 08/20/2018 0230   BILIRUBINUR NEGATIVE 08/20/2018 0230   BILIRUBINUR Negative 11/08/2014 1559   KETONESUR NEGATIVE 08/20/2018 0230   PROTEINUR 100 (A) 08/20/2018 0230   UROBILINOGEN 1.0 10/28/2010 1100   NITRITE POSITIVE (A) 08/20/2018 0230   LEUKOCYTESUR LARGE (A) 08/20/2018 0230   LEUKOCYTESUR Negative 11/08/2014 1559   Dr. Tobie Poet [facility]  If 7PM-7AM, please contact overnight-coverage provider If 7AM-7PM, please contact day coverage provider [email or web address]  06/15/2021, 8:08 PM

## 2021-06-16 DIAGNOSIS — I5032 Chronic diastolic (congestive) heart failure: Secondary | ICD-10-CM | POA: Diagnosis present

## 2021-06-16 DIAGNOSIS — Z992 Dependence on renal dialysis: Secondary | ICD-10-CM | POA: Diagnosis not present

## 2021-06-16 DIAGNOSIS — N2581 Secondary hyperparathyroidism of renal origin: Secondary | ICD-10-CM | POA: Diagnosis present

## 2021-06-16 DIAGNOSIS — Z20822 Contact with and (suspected) exposure to covid-19: Secondary | ICD-10-CM | POA: Diagnosis present

## 2021-06-16 DIAGNOSIS — K7460 Unspecified cirrhosis of liver: Secondary | ICD-10-CM | POA: Diagnosis present

## 2021-06-16 DIAGNOSIS — L97829 Non-pressure chronic ulcer of other part of left lower leg with unspecified severity: Secondary | ICD-10-CM | POA: Diagnosis present

## 2021-06-16 DIAGNOSIS — I8500 Esophageal varices without bleeding: Secondary | ICD-10-CM

## 2021-06-16 DIAGNOSIS — I8510 Secondary esophageal varices without bleeding: Secondary | ICD-10-CM | POA: Diagnosis present

## 2021-06-16 DIAGNOSIS — E785 Hyperlipidemia, unspecified: Secondary | ICD-10-CM | POA: Diagnosis present

## 2021-06-16 DIAGNOSIS — Z86711 Personal history of pulmonary embolism: Secondary | ICD-10-CM | POA: Diagnosis not present

## 2021-06-16 DIAGNOSIS — E8770 Fluid overload, unspecified: Secondary | ICD-10-CM | POA: Diagnosis present

## 2021-06-16 DIAGNOSIS — F32A Depression, unspecified: Secondary | ICD-10-CM | POA: Diagnosis present

## 2021-06-16 DIAGNOSIS — E8779 Other fluid overload: Secondary | ICD-10-CM | POA: Diagnosis not present

## 2021-06-16 DIAGNOSIS — I5082 Biventricular heart failure: Secondary | ICD-10-CM | POA: Diagnosis present

## 2021-06-16 DIAGNOSIS — D631 Anemia in chronic kidney disease: Secondary | ICD-10-CM | POA: Diagnosis present

## 2021-06-16 DIAGNOSIS — I4819 Other persistent atrial fibrillation: Secondary | ICD-10-CM | POA: Diagnosis present

## 2021-06-16 DIAGNOSIS — F1120 Opioid dependence, uncomplicated: Secondary | ICD-10-CM | POA: Diagnosis present

## 2021-06-16 DIAGNOSIS — I132 Hypertensive heart and chronic kidney disease with heart failure and with stage 5 chronic kidney disease, or end stage renal disease: Secondary | ICD-10-CM | POA: Diagnosis present

## 2021-06-16 DIAGNOSIS — F1994 Other psychoactive substance use, unspecified with psychoactive substance-induced mood disorder: Secondary | ICD-10-CM | POA: Diagnosis present

## 2021-06-16 DIAGNOSIS — J9601 Acute respiratory failure with hypoxia: Secondary | ICD-10-CM | POA: Diagnosis present

## 2021-06-16 DIAGNOSIS — N186 End stage renal disease: Secondary | ICD-10-CM | POA: Diagnosis present

## 2021-06-16 DIAGNOSIS — Z9115 Patient's noncompliance with renal dialysis: Secondary | ICD-10-CM | POA: Diagnosis not present

## 2021-06-16 DIAGNOSIS — I2510 Atherosclerotic heart disease of native coronary artery without angina pectoris: Secondary | ICD-10-CM | POA: Diagnosis present

## 2021-06-16 DIAGNOSIS — D6959 Other secondary thrombocytopenia: Secondary | ICD-10-CM | POA: Diagnosis present

## 2021-06-16 DIAGNOSIS — E875 Hyperkalemia: Secondary | ICD-10-CM | POA: Diagnosis present

## 2021-06-16 LAB — BASIC METABOLIC PANEL
Anion gap: 12 (ref 5–15)
BUN: 84 mg/dL — ABNORMAL HIGH (ref 8–23)
CO2: 24 mmol/L (ref 22–32)
Calcium: 8.1 mg/dL — ABNORMAL LOW (ref 8.9–10.3)
Chloride: 100 mmol/L (ref 98–111)
Creatinine, Ser: 10.69 mg/dL — ABNORMAL HIGH (ref 0.61–1.24)
GFR, Estimated: 5 mL/min — ABNORMAL LOW (ref >60)
Glucose, Bld: 91 mg/dL (ref 70–99)
Potassium: 5.8 mmol/L — ABNORMAL HIGH (ref 3.5–5.1)
Sodium: 136 mmol/L (ref 135–145)

## 2021-06-16 LAB — CBC
HCT: 27.5 % — ABNORMAL LOW (ref 39.0–52.0)
Hemoglobin: 9 g/dL — ABNORMAL LOW (ref 13.0–17.0)
MCH: 29.5 pg (ref 26.0–34.0)
MCHC: 32.7 g/dL (ref 30.0–36.0)
MCV: 90.2 fL (ref 80.0–100.0)
Platelets: 100 10*3/uL — ABNORMAL LOW (ref 150–400)
RBC: 3.05 MIL/uL — ABNORMAL LOW (ref 4.22–5.81)
RDW: 16.7 % — ABNORMAL HIGH (ref 11.5–15.5)
WBC: 5.5 10*3/uL (ref 4.0–10.5)
nRBC: 0 % (ref 0.0–0.2)

## 2021-06-16 LAB — TROPONIN I (HIGH SENSITIVITY): Troponin I (High Sensitivity): 89 ng/L — ABNORMAL HIGH (ref <18)

## 2021-06-16 LAB — HEPATITIS B SURFACE ANTIBODY,QUALITATIVE: Hep B S Ab: REACTIVE — AB

## 2021-06-16 LAB — HEPATITIS B SURFACE ANTIGEN: Hepatitis B Surface Ag: NONREACTIVE

## 2021-06-16 LAB — MRSA NEXT GEN BY PCR, NASAL: MRSA by PCR Next Gen: NOT DETECTED

## 2021-06-16 MED ORDER — EPOETIN ALFA 10000 UNIT/ML IJ SOLN
10000.0000 [IU] | INTRAMUSCULAR | Status: DC
  Filled 2021-06-16: qty 1

## 2021-06-16 MED ORDER — BUMETANIDE 1 MG PO TABS
1.0000 mg | ORAL_TABLET | ORAL | Status: DC
  Filled 2021-06-16 (×2): qty 1

## 2021-06-16 MED ORDER — SILVER SULFADIAZINE 1 % EX CREA
TOPICAL_CREAM | Freq: Two times a day (BID) | CUTANEOUS | Status: DC
  Administered 2021-06-16 (×2): 1 via TOPICAL
  Filled 2021-06-16: qty 85

## 2021-06-16 MED ORDER — OXYCODONE HCL ER 10 MG PO T12A
20.0000 mg | EXTENDED_RELEASE_TABLET | Freq: Two times a day (BID) | ORAL | Status: DC
  Administered 2021-06-16 – 2021-06-17 (×3): 20 mg via ORAL
  Filled 2021-06-16 (×3): qty 2

## 2021-06-16 NOTE — Consult Note (Signed)
WOC Nurse Consult Note: Patient receiving care in [facility] ED 10. Reason for Consult: LLE wound Wound type: presumed venous stasis, although I question if there might be an arterial component as well. Pressure Injury POA: Yes/No/NA Measurement: Wound bed: slough in largest wound along the pre-tibial area. All others, pink and shallow Drainage (amount, consistency, odor) some drainage on the old compression wrap (unna boot) Periwound: edematous Dressing procedure/placement/frequency: BEDSIDE nurse to wash LLE with soap and water, apply silvadene to all open wounds/weeping areas. Then cover those with ABD pads. Beginning behind the toes and going to just below the knees, spiral wrap kerlix then 4 or 6 inch Ace wrap.  Monitor the wound area(s) for worsening of condition such as: Signs/symptoms of infection,  Increase in size,  Development of or worsening of odor, Development of pain, or increased pain at the affected locations.  Notify the medical team if any of these develop.  Thank you for the consult.  Discussed plan of care with the patient and bedside nurse.  [place] nurse will not follow at this time.  Please re-consult the [place] team if needed.  Val Riles, RN, MSN, CWOCN, CNS-BC, pager [phone]

## 2021-06-16 NOTE — Progress Notes (Signed)
Patient with urgent need for dialysis having missed treatments at chronic clinic. LUA AVF with aneurisms cannulates with ease, maintains prescribed BFR throughout treatment. Targeted UF met, 3-liter fluid removal. VSS. Pt returns to the ED, report given to ED nurse.

## 2021-06-16 NOTE — ED Notes (Signed)
Pt has called multiple times for different requests and repeated requests for pain meds oxycodone 20mg  extended release. Pt made aware morphine IV and oxycodone 10mg  has been ordered and has been given to Pt. Pt insisting to get additional dose oxycodone. Alleges uses it daily at bedtime for pain relief. Pt reports has made pharmacist/tech and attending physician aware of all meds taken.   Pt is noted in NAD this time

## 2021-06-16 NOTE — Progress Notes (Signed)
PROGRESS NOTE    [name]  VFI:[id number] DOB: 1955-08-24 DOA: 06/15/2021 PCP: [facility]  Outpatient Specialists: wound care, GI ([facility]), nephrology    Brief Narrative:  From admission h and p [name] is a 65 y.o. male with medical history significant for hypertension, atrial fibrillation, on anticoagulation with Eliquis, end-stage renal disease on hemodialysis, noncompliant with hemodialysis, hyperlipidemia, psychiatric imbalance, history of polysubstance abuse, history of QT prolongation, who presents emergency department for chief concerns of shortness of breath.   He reports that he has not felt well in the last week and therefore has missed 2 hemodialysis sessions. He endorses generalized malaise with cough that is productive of yellow sputum. This was been worsening towards the end of last week, therefore he missed Friday and Monday HD. His last session was on Wednesday 11/30 and he states it was not a full treatment session.    He states the shortness of breath is worse with getting up and moving around around. At baseline he walks with a walker.    He endorses sharp chest pain that has been ongoing for 2-3 days. He states that pain is 9/10 and he denies trauma to his person. He reports he has never had pain like this before. He denies extremity changes.    He denies vision changes, dysphagia, fever, nausea, vomiting, diarrhea.    Assessment & Plan:   Principal Problem:   Volume overload Active Problems:   HLD (hyperlipidemia)   Hypertension   Substance induced mood disorder (HCC)   Noncompliance with renal dialysis ([place])   ESRD on dialysis (HCC)   SOB (shortness of breath)   Thrombocytopenia (HCC)   Chronic diastolic CHF (congestive heart failure) (HCC)   History of pulmonary embolism   History of substance abuse (HCC)   CAD (coronary artery disease)   Chronic anticoagulation   Persistent atrial fibrillation (HCC)   HFrEF (heart failure  with reduced ejection fraction) (HCC)   Hyperphosphatemia   Cirrhosis of liver (HCC)   Esophageal varices (HCC)  # Acute hypoxic respiratory failure 2/2 volume overload 2/2 missed hemodialysis. Currently stable on [place] O2, was treated with bipap and lasix overnight - dialysis today w/ ultrafiltration  # ESRD With volume overload 2/2 missed dialysis sessions - nephrology following, for dialysis today  # CAD # Chest pain Hx non-obstructive CAD, recent negative nuclear stress. Here complaining of chest pain, mild troponin elevation similar to priors and in setting of advanced ckd. Ekg w/o signs acs - repeat troponin - home statin  # Hyperkalemia 5.8, 2/2 missed dialysis - going for dialysis right now so holding on treatment - tele  # biventricular heart failure with mildly reduced EF EF from this year 40-45% with moderately reduced right ventricular function, mild MR. - dialysis as above - home coreg, imdur - home bumex w/ dialysis  # A fib, history PE - home eliquis  # HTN Here bp wnl - home hydral  # Chronic pain # Opioid dependence - home oxycodone  # Cirrhosis # Grade 1 esophageal varices # Thrombocytopenia Likely 2/2 alcohol use. Last saw GI in 2021. EGD from this year with grade 1 varices. Thrombocytopenia stable. No fever or abd pain to suggest sbp - close monitoring given doac.   # Chronic left lower extremity wounds  Patient sees wound clinic and he states the last time this was changed was last Thursday, 06/10/21 - Wound care consulted    # BPH - Flomax resumed   #  History of psychiatric imbalance # History of antisocial behavior # History of substance abuse     DVT prophylaxis: eliquis Code Status: full Family Communication: none @ bedside  Level of care: Telemetry Medical Status is: Observation  The patient will require care spanning > 2 midnights and should be moved to inpatient because: severity of illness       Consultants:   nephrology  Procedures: none  Antimicrobials:  none    Subjective: This morning sob persists but somewhat improved, same for chest pain. No fever. No abd pain  Objective: Vitals:   06/16/21 0600 06/16/21 0819 06/16/21 0832 06/16/21 0917  BP: (!) 137/95  (!) 131/93   Pulse: 73 (!) 57 72 68  Resp: 16 (!) 21 18 20   Temp:      TempSrc:      SpO2: 93% 98% 100% 99%  Weight:      Height:        Intake/Output Summary (Last 24 hours) at 06/16/2021 0950 Last data filed at 06/16/2021 7412 Gross per 24 hour  Intake 574 ml  Output 120 ml  Net 454 ml   Filed Weights   06/15/21 2049  Weight: 100 kg    Examination:  General exam: Appears calm and comfortable. Chronically ill appearing Respiratory system: Clear to auscultation save for rales at bases Cardiovascular system: S1 & S2 heard, irreg irreg. Soft systolic murmur. Edema to abdomen Gastrointestinal system: Abdomen is nondistended, soft and nontender. No organomegaly or masses felt. Normal bowel sounds heard. Central nervous system: Alert and oriented. No focal neurological deficits. Extremities: Symmetric 5 x 5 power. Skin: bandaged ulcer left anterior shin Psychiatry: Judgement and insight appear normal. Mood & affect appropriate.     Data Reviewed: I have personally reviewed following labs and imaging studies  CBC: Recent Labs  Lab 06/15/21 1724 06/16/21 0600  WBC 5.5 5.5  NEUTROABS 3.5  --   HGB 9.1* 9.0*  HCT 28.5* 27.5*  MCV 89.3 90.2  PLT 104* 878*   Basic Metabolic Panel: Recent Labs  Lab 06/15/21 1724 06/15/21 2012 06/16/21 0600  NA 138  --  136  K 5.5*  --  5.8*  CL 100  --  100  CO2 24  --  24  GLUCOSE 95  --  91  BUN 78*  --  84*  CREATININE 10.49*  --  10.69*  CALCIUM 8.3*  --  8.1*  MG  --  2.4  --   PHOS  --  8.6*  --    GFR: Estimated Creatinine Clearance: 8.7 mL/min (A) (by C-G formula based on SCr of 10.69 mg/dL (H)). Liver Function Tests: Recent Labs  Lab 06/15/21 1724   AST 20  ALT 18  ALKPHOS 96  BILITOT 1.0  PROT 7.3  ALBUMIN 3.3*   No results for input(s): LIPASE, AMYLASE in the last 168 hours. No results for input(s): AMMONIA in the last 168 hours. Coagulation Profile: No results for input(s): INR, PROTIME in the last 168 hours. Cardiac Enzymes: No results for input(s): CKTOTAL, CKMB, CKMBINDEX, TROPONINI in the last 168 hours. BNP (last 3 results) No results for input(s): PROBNP in the last 8760 hours. HbA1C: No results for input(s): HGBA1C in the last 72 hours. CBG: No results for input(s): GLUCAP in the last 168 hours. Lipid Profile: No results for input(s): CHOL, HDL, LDLCALC, TRIG, CHOLHDL, LDLDIRECT in the last 72 hours. Thyroid Function Tests: No results for input(s): TSH, T4TOTAL, FREET4, T3FREE, THYROIDAB in the last 72 hours.  Anemia Panel: No results for input(s): VITAMINB12, FOLATE, FERRITIN, TIBC, IRON, RETICCTPCT in the last 72 hours. Urine analysis:    Component Value Date/Time   COLORURINE YELLOW (A) 08/20/2018 0230   APPEARANCEUR CLOUDY (A) 08/20/2018 0230   APPEARANCEUR Clear 11/08/2014 1559   LABSPEC 1.010 08/20/2018 0230   LABSPEC 1.010 11/08/2014 1559   PHURINE 9.0 (H) 08/20/2018 0230   GLUCOSEU NEGATIVE 08/20/2018 0230   GLUCOSEU Negative 11/08/2014 1559   HGBUR NEGATIVE 08/20/2018 0230   BILIRUBINUR NEGATIVE 08/20/2018 0230   BILIRUBINUR Negative 11/08/2014 1559   KETONESUR NEGATIVE 08/20/2018 0230   PROTEINUR 100 (A) 08/20/2018 0230   UROBILINOGEN 1.0 10/28/2010 1100   NITRITE POSITIVE (A) 08/20/2018 0230   LEUKOCYTESUR LARGE (A) 08/20/2018 0230   LEUKOCYTESUR Negative 11/08/2014 1559   Sepsis Labs: @LABRCNTIP (procalcitonin:4,lacticidven:4)  ) Recent Results (from the past 240 hour(s))  Resp Panel by RT-PCR (Flu A&B, Covid) Nasopharyngeal Swab     Status: None   Collection Time: 06/15/21  6:01 PM   Specimen: Nasopharyngeal Swab; Nasopharyngeal(NP) swabs in vial transport medium  Result Value Ref  Range Status   SARS Coronavirus 2 by RT PCR NEGATIVE NEGATIVE Final    Comment: (NOTE) SARS-CoV-2 target nucleic acids are NOT DETECTED.  The SARS-CoV-2 RNA is generally detectable in upper respiratory specimens during the acute phase of infection. The lowest concentration of SARS-CoV-2 viral copies this assay can detect is 138 copies/mL. A negative result does not preclude SARS-Cov-2 infection and should not be used as the sole basis for treatment or other patient management decisions. A negative result may occur with  improper specimen collection/handling, submission of specimen other than nasopharyngeal swab, presence of viral mutation(s) within the areas targeted by this assay, and inadequate number of viral copies(<138 copies/mL). A negative result must be combined with clinical observations, patient history, and epidemiological information. The expected result is Negative.  Fact Sheet for Patients:  EntrepreneurPulse.com.au  Fact Sheet for Healthcare Providers:  IncredibleEmployment.be  This test is no t yet approved or cleared by the [place] FDA and  has been authorized for detection and/or diagnosis of SARS-CoV-2 by FDA under an Emergency Use Authorization (EUA). This EUA will remain  in effect (meaning this test can be used) for the duration of the COVID-19 declaration under Section 564(b)(1) of the Act, 21 U.S.C.section 360bbb-3(b)(1), unless the authorization is terminated  or revoked sooner.       Influenza A by PCR NEGATIVE NEGATIVE Final   Influenza B by PCR NEGATIVE NEGATIVE Final    Comment: (NOTE) The Xpert Xpress SARS-CoV-2/FLU/RSV plus assay is intended as an aid in the diagnosis of influenza from Nasopharyngeal swab specimens and should not be used as a sole basis for treatment. Nasal washings and aspirates are unacceptable for Xpert Xpress SARS-CoV-2/FLU/RSV testing.  Fact Sheet for  Patients: EntrepreneurPulse.com.au  Fact Sheet for Healthcare Providers: IncredibleEmployment.be  This test is not yet approved or cleared by the [place] FDA and has been authorized for detection and/or diagnosis of SARS-CoV-2 by FDA under an Emergency Use Authorization (EUA). This EUA will remain in effect (meaning this test can be used) for the duration of the COVID-19 declaration under Section 564(b)(1) of the Act, 21 U.S.C. section 360bbb-3(b)(1), unless the authorization is terminated or revoked.  Performed at [facility], [address], [place], [place] [address]          Radiology Studies: DG Chest Portable 1 View  Result Date: 06/15/2021 CLINICAL DATA:  Shortness of breath EXAM: PORTABLE  CHEST 1 VIEW COMPARISON:  Chest x-ray 01/05/2021 FINDINGS: Heart is enlarged. Mediastinum appears grossly stable. Calcified plaques in the aortic arch. Pulmonary vascular congestion with no focal consolidation visualized. No significant pleural effusion. No pneumothorax. IMPRESSION: Cardiomegaly and pulmonary vascular congestion. Electronically Signed   By: Ofilia Neas M.D.   On: 06/15/2021 18:24        Scheduled Meds:  apixaban  5 mg Oral BID   atorvastatin  40 mg Oral QHS   bumetanide  1 mg Oral See admin instructions   carvedilol  6.25 mg Oral BID   epoetin (EPOGEN/PROCRIT) injection  10,000 Units Intravenous Q M,W,F-HD   ferric citrate  210 mg Oral TID WC   ferric citrate  210 mg Oral With snacks   hydrALAZINE  100 mg Oral Daily   isosorbide mononitrate  60 mg Oral Daily   silver sulfADIAZINE   Topical BID   tamsulosin  0.4 mg Oral Daily   Continuous Infusions:   LOS: 0 days    Time spent: 32 min    Nicholas Maxim, MD [facility]   If 7PM-7AM, please contact night-coverage [email or web address] Password [email or web address] 06/16/2021, 9:50 AM

## 2021-06-16 NOTE — ED Notes (Signed)
Pt transported to dialysis

## 2021-06-16 NOTE — Consult Note (Signed)
ANTICOAGULATION CONSULT NOTE - Initial Consult  Pharmacy Consult for Apixaban Indication: non-valvular atrial fibrillation  Allergies  Allergen Reactions   Cyclobenzaprine Nausea Only   Clonidine Other (See Comments)    Asymptomatic bradycardia to 38   Furosemide Other (See Comments)    Caused renal failure   Tylenol [Acetaminophen] Other (See Comments)    Reaction:  Pt states it bothers his liver    Patient Measurements: Height: 6' (182.9 cm) Weight: 100 kg (220 lb 7.4 oz) IBW/kg (Calculated) : 77.6   Vital Signs: BP: 131/93 (12/07 0832) Pulse Rate: 68 (12/07 0917)  Labs: Recent Labs    06/15/21 1724 06/16/21 0600  HGB 9.1* 9.0*  HCT 28.5* 27.5*  PLT 104* 100*  CREATININE 10.49* 10.69*  TROPONINIHS 97*  --     Estimated Creatinine Clearance: 8.7 mL/min (A) (by C-G formula based on SCr of 10.69 mg/dL (H)).   Medical History: Past Medical History:  Diagnosis Date   Depression    Diastolic dysfunction    a. 09/2019 Echo: EF 55-60%, no rwma, mod LVH, gr2 DD. Nl RV size/fxn. Mildly dil LA. Ao root 4.5cm.   Dilated aortic root ([place])    a. 09/2019 Echo: Ao root 4.5cm.   Elevated troponin level not due myocardial infarction    ESRD (end stage renal disease) ([place])    Hypertension    Nonobstructive Coronary Artery Disease    a. 09/2019 Cath: LM nl, LAD min irregs, D1 nl, D2 min irregs, LCX large, min irregs, RCA large, 20p, 30p/m.   PE (pulmonary embolism)    Renal insufficiency     Medications:  PTA: Apixaban 5 mg BID -- unknown last dose   Assessment: 66 y.o. male with medical history significant for atrial fibrillation, on anticoagulation with Eliquis, end-stage renal disease on hemodialysis. Pharmacy has been consulted for apixaban dosing  Goal of Therapy:  Monitor platelets by anticoagulation protocol: Yes   Plan:  Based on current weight, age, and Scr: Continue apixaban 5 mg BID as prescribed  Continue to monitor CBC at least every 3 days   Dorothe Pea, PharmD, BCPS Clinical Pharmacist   06/16/2021,9:53 AM

## 2021-06-16 NOTE — ED Notes (Signed)
Pts left leg wound cleaned with soap and water. Silvadine cream placed on wounds and abd pad applied. Leg was wrapped in curlex and ace wrap.

## 2021-06-16 NOTE — Progress Notes (Signed)
[facility]  ROUNDING NOTE   Subjective:   [name] was admitted to [facility] on 06/15/2021 for Volume overload [E87.70]  Last hemodialysis treatment was last Wednesday, 11/30.   Patient brought down to the dialysis unit for urgent hemodialysis treatment for hyperkalemia and pulmonary edema. Seen and examined on hemodialysis treatment. UF goal of  3-4 liters.   When asked why he did not go to dialysis, he states he did not feel like going.     Objective:  Vital signs in last 24 hours:  Temp:  [97.7 F (36.5 C)-99 F (37.2 C)] 97.7 F (36.5 C) (12/07 1032) Pulse Rate:  [53-93] 68 (12/07 0917) Resp:  [14-23] 20 (12/07 0917) BP: (123-182)/(68-112) 123/68 (12/07 1032) SpO2:  [93 %-100 %] 99 % (12/07 0917) Weight:  [100 kg] 100 kg (12/06 2049)  Weight change:  Filed Weights   06/15/21 2049  Weight: 100 kg    Intake/Output: I/O last 3 completed shifts: In: 324 [P.O.:574] Out: 120 [Urine:120]   Intake/Output this shift:  No intake/output data recorded.  Physical Exam: General: Laying on stretcher, mild respiratory discomfort  Head: Normocephalic, atraumatic. Moist oral mucosal membranes  Eyes: Anicteric, PERRL  Neck: Supple, trachea midline  Lungs:  crackles  Heart: Regular rate and rhythm  Abdomen:  Soft, nontender,   Extremities:  ++ peripheral edema. Left dressing clean and dry  Neurologic: Nonfocal, moving all four extremities  Skin: No lesions  Access: Left AVF    Basic Metabolic Panel: Recent Labs  Lab 06/15/21 1724 06/15/21 2012 06/16/21 0600  NA 138  --  136  K 5.5*  --  5.8*  CL 100  --  100  CO2 24  --  24  GLUCOSE 95  --  91  BUN 78*  --  84*  CREATININE 10.49*  --  10.69*  CALCIUM 8.3*  --  8.1*  MG  --  2.4  --   PHOS  --  8.6*  --     Liver Function Tests: Recent Labs  Lab 06/15/21 1724  AST 20  ALT 18  ALKPHOS 96  BILITOT 1.0  PROT 7.3  ALBUMIN 3.3*   No results for input(s): LIPASE, AMYLASE in the last  168 hours. No results for input(s): AMMONIA in the last 168 hours.  CBC: Recent Labs  Lab 06/15/21 1724 06/16/21 0600  WBC 5.5 5.5  NEUTROABS 3.5  --   HGB 9.1* 9.0*  HCT 28.5* 27.5*  MCV 89.3 90.2  PLT 104* 100*    Cardiac Enzymes: No results for input(s): CKTOTAL, CKMB, CKMBINDEX, TROPONINI in the last 168 hours.  BNP: Invalid input(s): POCBNP  CBG: No results for input(s): GLUCAP in the last 168 hours.  Microbiology: Results for orders placed or performed during the hospital encounter of 06/15/21  Resp Panel by RT-PCR (Flu A&B, Covid) Nasopharyngeal Swab     Status: None   Collection Time: 06/15/21  6:01 PM   Specimen: Nasopharyngeal Swab; Nasopharyngeal(NP) swabs in vial transport medium  Result Value Ref Range Status   SARS Coronavirus 2 by RT PCR NEGATIVE NEGATIVE Final    Comment: (NOTE) SARS-CoV-2 target nucleic acids are NOT DETECTED.  The SARS-CoV-2 RNA is generally detectable in upper respiratory specimens during the acute phase of infection. The lowest concentration of SARS-CoV-2 viral copies this assay can detect is 138 copies/mL. A negative result does not preclude SARS-Cov-2 infection and should not be used as the sole basis for treatment or other patient management  decisions. A negative result may occur with  improper specimen collection/handling, submission of specimen other than nasopharyngeal swab, presence of viral mutation(s) within the areas targeted by this assay, and inadequate number of viral copies(<138 copies/mL). A negative result must be combined with clinical observations, patient history, and epidemiological information. The expected result is Negative.  Fact Sheet for Patients:  EntrepreneurPulse.com.au  Fact Sheet for Healthcare Providers:  IncredibleEmployment.be  This test is no t yet approved or cleared by the [place] FDA and  has been authorized for detection and/or diagnosis of  SARS-CoV-2 by FDA under an Emergency Use Authorization (EUA). This EUA will remain  in effect (meaning this test can be used) for the duration of the COVID-19 declaration under Section 564(b)(1) of the Act, 21 U.S.C.section 360bbb-3(b)(1), unless the authorization is terminated  or revoked sooner.       Influenza A by PCR NEGATIVE NEGATIVE Final   Influenza B by PCR NEGATIVE NEGATIVE Final    Comment: (NOTE) The Xpert Xpress SARS-CoV-2/FLU/RSV plus assay is intended as an aid in the diagnosis of influenza from Nasopharyngeal swab specimens and should not be used as a sole basis for treatment. Nasal washings and aspirates are unacceptable for Xpert Xpress SARS-CoV-2/FLU/RSV testing.  Fact Sheet for Patients: EntrepreneurPulse.com.au  Fact Sheet for Healthcare Providers: IncredibleEmployment.be  This test is not yet approved or cleared by the [place] FDA and has been authorized for detection and/or diagnosis of SARS-CoV-2 by FDA under an Emergency Use Authorization (EUA). This EUA will remain in effect (meaning this test can be used) for the duration of the COVID-19 declaration under Section 564(b)(1) of the Act, 21 U.S.C. section 360bbb-3(b)(1), unless the authorization is terminated or revoked.  Performed at [facility], [address] [place], [place] [address]     Coagulation Studies: No results for input(s): LABPROT, INR in the last 72 hours.  Urinalysis: No results for input(s): COLORURINE, LABSPEC, PHURINE, GLUCOSEU, HGBUR, BILIRUBINUR, KETONESUR, PROTEINUR, UROBILINOGEN, NITRITE, LEUKOCYTESUR in the last 72 hours.  Invalid input(s): APPERANCEUR    Imaging: DG Chest Portable 1 View  Result Date: 06/15/2021 CLINICAL DATA:  Shortness of breath EXAM: PORTABLE CHEST 1 VIEW COMPARISON:  Chest x-ray 01/05/2021 FINDINGS: Heart is enlarged. Mediastinum appears grossly stable. Calcified plaques in the aortic arch.  Pulmonary vascular congestion with no focal consolidation visualized. No significant pleural effusion. No pneumothorax. IMPRESSION: Cardiomegaly and pulmonary vascular congestion. Electronically Signed   By: Ofilia Neas M.D.   On: 06/15/2021 18:24     Medications:     apixaban  5 mg Oral BID   atorvastatin  40 mg Oral QHS   [START ON 06/17/2021] bumetanide  1 mg Oral 2 times per day on Sun Tue Thu Sat   carvedilol  6.25 mg Oral BID   epoetin (EPOGEN/PROCRIT) injection  10,000 Units Intravenous Q M,W,F-HD   ferric citrate  210 mg Oral TID WC   ferric citrate  210 mg Oral With snacks   hydrALAZINE  100 mg Oral Daily   isosorbide mononitrate  60 mg Oral Daily   silver sulfADIAZINE   Topical BID   tamsulosin  0.4 mg Oral Daily   acetaminophen **OR** acetaminophen, albuterol, dextromethorphan-guaiFENesin, ondansetron **OR** ondansetron (ZOFRAN) IV, oxyCODONE, senna  Assessment/ Plan:  [name] is a 65 y.o. Black male with end stage renal disease, hypertension, atrial fibrillation, coronary artery disease, pulmonary embolism, hyperlipidemia, polysubstance abuse who is admitted to [facility] on 06/15/2021 for Volume overload [E87.70]  [facility]phrology TTS  [place] Left AVF 118kg  End Stage Renal Disease: hyperkalemia and pulmonary edema requiring oxygen.  - Seen and examined on emergent hemodialysis treatment. 2K bath. Tolerating treatment well.  - Will monitor daily for dialysis need due to volume overload.   Hypertension: 123/68. Resumed home regimen of bumetanide, carvedilol, hydralazine, isosorbide mononitrate, tamsulosin  Anemia of chronic kidney disease: with thrombocytopenia.  - EPO with HD treatment  Secondary Hyperparathyroidism: with hyperphosphatemia - Auryxia with meals.    LOS: 0 Tristin Gladman 12/7/202211:18 AM

## 2021-06-16 NOTE — ED Notes (Signed)
Legrand Como RN aware of assigned bed

## 2021-06-17 ENCOUNTER — Inpatient Hospital Stay: Payer: Medicare Other

## 2021-06-17 LAB — RENAL FUNCTION PANEL
Albumin: 3 g/dL — ABNORMAL LOW (ref 3.5–5.0)
Anion gap: 14 (ref 5–15)
BUN: 51 mg/dL — ABNORMAL HIGH (ref 8–23)
CO2: 28 mmol/L (ref 22–32)
Calcium: 8 mg/dL — ABNORMAL LOW (ref 8.9–10.3)
Chloride: 97 mmol/L — ABNORMAL LOW (ref 98–111)
Creatinine, Ser: 7.48 mg/dL — ABNORMAL HIGH (ref 0.61–1.24)
GFR, Estimated: 8 mL/min — ABNORMAL LOW (ref >60)
Glucose, Bld: 96 mg/dL (ref 70–99)
Phosphorus: 7.3 mg/dL — ABNORMAL HIGH (ref 2.5–4.6)
Potassium: 4.6 mmol/L (ref 3.5–5.1)
Sodium: 139 mmol/L (ref 135–145)

## 2021-06-17 LAB — CBC
HCT: 26.6 % — ABNORMAL LOW (ref 39.0–52.0)
Hemoglobin: 8.6 g/dL — ABNORMAL LOW (ref 13.0–17.0)
MCH: 29.2 pg (ref 26.0–34.0)
MCHC: 32.3 g/dL (ref 30.0–36.0)
MCV: 90.2 fL (ref 80.0–100.0)
Platelets: 92 10*3/uL — ABNORMAL LOW (ref 150–400)
RBC: 2.95 MIL/uL — ABNORMAL LOW (ref 4.22–5.81)
RDW: 16.6 % — ABNORMAL HIGH (ref 11.5–15.5)
WBC: 5.4 10*3/uL (ref 4.0–10.5)
nRBC: 0 % (ref 0.0–0.2)

## 2021-06-17 LAB — TROPONIN I (HIGH SENSITIVITY)
Troponin I (High Sensitivity): 71 ng/L — ABNORMAL HIGH (ref <18)
Troponin I (High Sensitivity): 79 ng/L — ABNORMAL HIGH

## 2021-06-17 LAB — HEPATITIS B SURFACE ANTIBODY, QUANTITATIVE: Hep B S AB Quant (Post): 20.8 m[IU]/mL (ref >9.9)

## 2021-06-17 MED ORDER — NITROGLYCERIN 0.4 MG SL SUBL
SUBLINGUAL_TABLET | SUBLINGUAL | Status: AC
  Filled 2021-06-17: qty 1

## 2021-06-17 MED ORDER — OXYCODONE HCL 5 MG PO TABS
ORAL_TABLET | ORAL | Status: AC
  Filled 2021-06-17: qty 2

## 2021-06-17 NOTE — Progress Notes (Signed)
[name] to be D/C'd Home per MD order.  Discussed prescriptions and follow up appointments with the patient. Prescriptions given to patient, medication list explained in detail. Pt verbalized understanding.  Allergies as of 06/17/2021       Reactions   Cyclobenzaprine Nausea Only   Clonidine Other (See Comments)   Asymptomatic bradycardia to 38   Furosemide Other (See Comments)   Caused renal failure   Tylenol [acetaminophen] Other (See Comments)   Reaction:  Pt states it bothers his liver        Medication List     TAKE these medications    albuterol 108 (90 Base) MCG/ACT inhaler Commonly known as: VENTOLIN HFA Inhale 2 puffs into the lungs every 6 (six) hours as needed for wheezing or shortness of breath.   apixaban 5 MG Tabs tablet Commonly known as: Eliquis Take 1 tablet (5 mg total) by mouth 2 (two) times daily.   atorvastatin 40 MG tablet Commonly known as: LIPITOR Take 1 tablet (40 mg total) by mouth at bedtime.   Auryxia 1 GM 210 MG(Fe) tablet Generic drug: ferric citrate Take 210 mg by mouth See admin instructions. Take 1 tablet (210mg ) by mouth three times daily with meals and take 1 tablet (210mg ) by mouth with snacks   bumetanide 1 MG tablet Commonly known as: BUMEX Take 1 mg by mouth See admin instructions. Take 1 mg by mouth twice daily on non-dialysis days   carvedilol 6.25 MG tablet Commonly known as: COREG Take 6.25 mg by mouth 2 (two) times daily.   [facility] Senna 8.6 MG tablet Generic drug: senna Take 2 tablets by mouth daily as needed.   dextromethorphan-guaiFENesin 30-600 MG 12hr tablet Commonly known as: MUCINEX DM Take 1 tablet by mouth 2 (two) times daily as needed for cough.   diclofenac Sodium 1 % Gel Commonly known as: VOLTAREN Apply 2 g topically 4 (four) times daily.   hydrALAZINE 100 MG tablet Commonly known as: APRESOLINE Take 1 tablet (100 mg total) by mouth 3 (three) times daily. What changed: when to take this    isosorbide mononitrate 60 MG 24 hr tablet Commonly known as: IMDUR Take 1 tablet (60 mg total) by mouth daily.   ondansetron 4 MG tablet Commonly known as: ZOFRAN Take 1 tablet (4 mg total) by mouth every 6 (six) hours as needed for nausea.   oxyCODONE 20 mg 12 hr tablet Commonly known as: OXYCONTIN Take 20 mg by mouth every 12 (twelve) hours.   oxyCODONE 5 MG immediate release tablet Commonly known as: Oxy IR/ROXICODONE Take 10 mg by mouth every 4 (four) hours as needed for severe pain.   pantoprazole 40 MG tablet Commonly known as: Protonix Take 1 tablet (40 mg total) by mouth daily.   polyethylene glycol-electrolytes 420 g solution Commonly known as: NuLYTELY Take 17 g by mouth daily as needed (constipation).   tamsulosin 0.4 MG Caps capsule Commonly known as: FLOMAX Take 0.4 mg by mouth daily.               Discharge Care Instructions  (From admission, onward)           Start     Ordered   06/17/21 0000  Discharge wound care:       Comments: Follow instructions from wound care clinic   06/17/21 1541            Vitals:   06/17/21 1605 06/17/21 1635  BP:  (!) 157/86  Pulse: 81 76  Resp: 13 16  Temp:  (!) 97.5 F (36.4 C)  SpO2:  99%    Skin clean, dry and intact without evidence of skin break down, no evidence of skin tears noted. IV catheter discontinued intact. Site without signs and symptoms of complications. Dressing and pressure applied. Pt denies pain at this time. No complaints noted.  An After Visit Summary was printed and given to the patient. Patient escorted via [place], and D/C home via private auto.  Coleman C. Deatra Ina

## 2021-06-17 NOTE — Progress Notes (Signed)
[facility]  ROUNDING NOTE   Subjective:   [name] was admitted to [facility] on 06/15/2021 for Volume overload [E87.70] Hypervolemia, unspecified hypervolemia type [E87.70] Dyspnea, unspecified type [R06.00]  Patient seen sitting on side of bed Weaned to room air, states he feels a little better.  Patient seen later during dialysis   HEMODIALYSIS FLOWSHEET:  Blood Flow Rate (mL/min): 400 mL/min Arterial Pressure (mmHg): -150 mmHg Venous Pressure (mmHg): 190 mmHg Transmembrane Pressure (mmHg): 20 mmHg Ultrafiltration Rate (mL/min): 1000 mL/min Dialysate Flow Rate (mL/min):  (sequential) Conductivity: Machine : 13.9 Conductivity: Machine : 13.9 Dialysis Fluid Bolus: Normal Saline Bolus Amount (mL): 250 mL  Per nursing, patient complained of chest pain 9/10 prior to initiating dialysis. RRT called. EKG and chest xray within acceptable limits  Dialysis initiated  Objective:  Vital signs in last 24 hours:  Temp:  [97.6 F (36.4 C)-98.2 F (36.8 C)] 97.6 F (36.4 C) (12/08 1023) Pulse Rate:  [64-85] 74 (12/08 1245) Resp:  [12-20] 12 (12/08 1245) BP: (112-146)/(65-105) 137/83 (12/08 1245) SpO2:  [94 %-100 %] 98 % (12/08 1100) Weight:  [128.4 kg-130.1 kg] 130.1 kg (12/08 1024)  Weight change: 28.4 kg Filed Weights   06/15/21 2049 06/17/21 0241 06/17/21 1024  Weight: 100 kg 128.4 kg 130.1 kg    Intake/Output: I/O last 3 completed shifts: In: 7124 [P.O.:1174] Out: 5809 [Urine:120; Other:3011]   Intake/Output this shift:  No intake/output data recorded.  Physical Exam: General: Laying in bed  Head: Normocephalic, atraumatic. Moist oral mucosal membranes  Eyes: Anicteric  Lungs:  Mild Basilar crackles, normal effort  Heart: Regular rate and rhythm  Abdomen:  Soft, nontender  Extremities:  ++ peripheral edema. Left dressing clean and dry  Neurologic: Nonfocal, moving all four extremities  Skin: No lesions  Access: Left AVF    Basic  Metabolic Panel: Recent Labs  Lab 06/15/21 1724 06/15/21 2012 06/16/21 0600 06/17/21 0438  NA 138  --  136 139  K 5.5*  --  5.8* 4.6  CL 100  --  100 97*  CO2 24  --  24 28  GLUCOSE 95  --  91 96  BUN 78*  --  84* 51*  CREATININE 10.49*  --  10.69* 7.48*  CALCIUM 8.3*  --  8.1* 8.0*  MG  --  2.4  --   --   PHOS  --  8.6*  --  7.3*     Liver Function Tests: Recent Labs  Lab 06/15/21 1724 06/17/21 0438  AST 20  --   ALT 18  --   ALKPHOS 96  --   BILITOT 1.0  --   PROT 7.3  --   ALBUMIN 3.3* 3.0*    No results for input(s): LIPASE, AMYLASE in the last 168 hours. No results for input(s): AMMONIA in the last 168 hours.  CBC: Recent Labs  Lab 06/15/21 1724 06/16/21 0600 06/17/21 0438  WBC 5.5 5.5 5.4  NEUTROABS 3.5  --   --   HGB 9.1* 9.0* 8.6*  HCT 28.5* 27.5* 26.6*  MCV 89.3 90.2 90.2  PLT 104* 100* 92*     Cardiac Enzymes: No results for input(s): CKTOTAL, CKMB, CKMBINDEX, TROPONINI in the last 168 hours.  BNP: Invalid input(s): POCBNP  CBG: No results for input(s): GLUCAP in the last 168 hours.  Microbiology: Results for orders placed or performed during the hospital encounter of 06/15/21  Resp Panel by RT-PCR (Flu A&B, Covid) Nasopharyngeal Swab     Status:  None   Collection Time: 06/15/21  6:01 PM   Specimen: Nasopharyngeal Swab; Nasopharyngeal(NP) swabs in vial transport medium  Result Value Ref Range Status   SARS Coronavirus 2 by RT PCR NEGATIVE NEGATIVE Final    Comment: (NOTE) SARS-CoV-2 target nucleic acids are NOT DETECTED.  The SARS-CoV-2 RNA is generally detectable in upper respiratory specimens during the acute phase of infection. The lowest concentration of SARS-CoV-2 viral copies this assay can detect is 138 copies/mL. A negative result does not preclude SARS-Cov-2 infection and should not be used as the sole basis for treatment or other patient management decisions. A negative result may occur with  improper specimen  collection/handling, submission of specimen other than nasopharyngeal swab, presence of viral mutation(s) within the areas targeted by this assay, and inadequate number of viral copies(<138 copies/mL). A negative result must be combined with clinical observations, patient history, and epidemiological information. The expected result is Negative.  Fact Sheet for Patients:  EntrepreneurPulse.com.au  Fact Sheet for Healthcare Providers:  IncredibleEmployment.be  This test is no t yet approved or cleared by the [place] FDA and  has been authorized for detection and/or diagnosis of SARS-CoV-2 by FDA under an Emergency Use Authorization (EUA). This EUA will remain  in effect (meaning this test can be used) for the duration of the COVID-19 declaration under Section 564(b)(1) of the Act, 21 U.S.C.section 360bbb-3(b)(1), unless the authorization is terminated  or revoked sooner.       Influenza A by PCR NEGATIVE NEGATIVE Final   Influenza B by PCR NEGATIVE NEGATIVE Final    Comment: (NOTE) The Xpert Xpress SARS-CoV-2/FLU/RSV plus assay is intended as an aid in the diagnosis of influenza from Nasopharyngeal swab specimens and should not be used as a sole basis for treatment. Nasal washings and aspirates are unacceptable for Xpert Xpress SARS-CoV-2/FLU/RSV testing.  Fact Sheet for Patients: EntrepreneurPulse.com.au  Fact Sheet for Healthcare Providers: IncredibleEmployment.be  This test is not yet approved or cleared by the [place] FDA and has been authorized for detection and/or diagnosis of SARS-CoV-2 by FDA under an Emergency Use Authorization (EUA). This EUA will remain in effect (meaning this test can be used) for the duration of the COVID-19 declaration under Section 564(b)(1) of the Act, 21 U.S.C. section 360bbb-3(b)(1), unless the authorization is terminated or revoked.  Performed at [facility], [address] [place], [place] [address]   MRSA Next Gen by PCR, Nasal     Status: None   Collection Time: 06/16/21  8:30 PM   Specimen: Nasal Mucosa; Nasal Swab  Result Value Ref Range Status   MRSA by PCR Next Gen NOT DETECTED NOT DETECTED Final    Comment: (NOTE) The GeneXpert MRSA Assay (FDA approved for NASAL specimens only), is one component of a comprehensive MRSA colonization surveillance program. It is not intended to diagnose MRSA infection nor to guide or monitor treatment for MRSA infections. Test performance is not FDA approved in patients less than 41 years old. Performed at [facility], [address] [place], [place] [address]     Coagulation Studies: No results for input(s): LABPROT, INR in the last 72 hours.  Urinalysis: No results for input(s): COLORURINE, LABSPEC, PHURINE, GLUCOSEU, HGBUR, BILIRUBINUR, KETONESUR, PROTEINUR, UROBILINOGEN, NITRITE, LEUKOCYTESUR in the last 72 hours.  Invalid input(s): APPERANCEUR    Imaging: DG Chest Port 1 View  Result Date: 06/17/2021 CLINICAL DATA:  Chest pain, shortness of breath EXAM: PORTABLE CHEST 1 VIEW COMPARISON:  Chest radiograph 06/15/2021 FINDINGS: The  heart is markedly enlarged, unchanged. The upper mediastinal contours are stable. There is vascular congestion without evidence of overt pulmonary edema. There are patchy retrocardiac opacities, unchanged. There is no new or worsening airspace disease. There is no pleural effusion or pneumothorax. There is no acute osseous abnormality. IMPRESSION: 1. Unchanged cardiomegaly with vascular congestion but no overt pulmonary edema. 2. Patchy opacities in the left base may reflect atelectasis, infection, or aspiration. Electronically Signed   By: Valetta Mole M.D.   On: 06/17/2021 11:09   DG Chest Portable 1 View  Result Date: 06/15/2021 CLINICAL DATA:  Shortness of breath EXAM: PORTABLE CHEST 1 VIEW COMPARISON:  Chest x-ray 01/05/2021 FINDINGS:  Heart is enlarged. Mediastinum appears grossly stable. Calcified plaques in the aortic arch. Pulmonary vascular congestion with no focal consolidation visualized. No significant pleural effusion. No pneumothorax. IMPRESSION: Cardiomegaly and pulmonary vascular congestion. Electronically Signed   By: Ofilia Neas M.D.   On: 06/15/2021 18:24     Medications:     apixaban  5 mg Oral BID   atorvastatin  40 mg Oral QHS   bumetanide  1 mg Oral 2 times per day on Sun Tue Thu Sat   carvedilol  6.25 mg Oral BID   epoetin (EPOGEN/PROCRIT) injection  10,000 Units Intravenous Q M,W,F-HD   ferric citrate  210 mg Oral TID WC   ferric citrate  210 mg Oral With snacks   hydrALAZINE  100 mg Oral Daily   isosorbide mononitrate  60 mg Oral Daily   nitroGLYCERIN       oxyCODONE       oxyCODONE  20 mg Oral Q12H   silver sulfADIAZINE   Topical BID   tamsulosin  0.4 mg Oral Daily   acetaminophen **OR** acetaminophen, albuterol, dextromethorphan-guaiFENesin, ondansetron **OR** ondansetron (ZOFRAN) IV, oxyCODONE, senna  Assessment/ Plan:  [name] is a 65 y.o. Black male with end stage renal disease, hypertension, atrial fibrillation, coronary artery disease, pulmonary embolism, hyperlipidemia, polysubstance abuse who is admitted to [facility] on 06/15/2021 for Volume overload [E87.70] Hypervolemia, unspecified hypervolemia type [E87.70] Dyspnea, unspecified type [R06.00]  [facility] Left AVF 118kg  End Stage Renal Disease: hyperkalemia and pulmonary edema requiring oxygen.  - Received urgent dialysis yesterday with UF goal 3L achieved.  - potassium improved to 4.6 - Currently receiving sequential only treatment today for additional fluid removal to improve respiratory status.  - Evaluated for chest pain, EKG shows a-fib and chest xray negative. Troponin 71  Hypertension: 137/83 Resumed home regimen of bumetanide, carvedilol, hydralazine, isosorbide mononitrate,  tamsulosin  Anemia of chronic kidney disease: with thrombocytopenia.   - Hgb 8.6, platelets 92 - EPO with HD treatment  Secondary Hyperparathyroidism: with hyperphosphatemia - Continue Auryxia with meals.    LOS: 1 Shaunda Tipping 12/8/202212:51 PM

## 2021-06-17 NOTE — Discharge Summary (Signed)
[name] GGY:[id number] DOB: [birth year] DOA: 06/15/2021  PCP: [facility]  Admit date: 06/15/2021 Discharge date: 06/17/2021  Time spent: 45 minutes  Recommendations for Outpatient Follow-up:  Outpatient f/u with cardiology and gastroenterology    Discharge Diagnoses:  Principal Problem:   Volume overload Active Problems:   HLD (hyperlipidemia)   Hypertension   Substance induced mood disorder ([place])   Noncompliance with renal dialysis ([place])   ESRD on dialysis ([place])   SOB (shortness of breath)   Thrombocytopenia (HCC)   Chronic diastolic CHF (congestive heart failure) ([place])   History of pulmonary embolism   History of substance abuse ([place])   CAD (coronary artery disease)   Chronic anticoagulation   Persistent atrial fibrillation (HCC)   HFrEF (heart failure with reduced ejection fraction) ([place])   Hyperphosphatemia   Cirrhosis of liver ([place])   Esophageal varices ([place])   Discharge Condition: stable  Diet recommendation: low sodium  Filed Weights   06/17/21 0241 06/17/21 1024 06/17/21 1442  Weight: 128.4 kg 130.1 kg 127.9 kg    History of present illness:  [name] is a 65 y.o. male with medical history significant for hypertension, atrial fibrillation, on anticoagulation with Eliquis, end-stage renal disease on hemodialysis, noncompliant with hemodialysis, hyperlipidemia, psychiatric imbalance, history of polysubstance abuse, history of QT prolongation, who presents emergency department for chief concerns of shortness of breath. He reports that he has not felt well in the last week and therefore has missed 2 hemodialysis sessions. He endorses generalized malaise with cough that is productive of yellow sputum. This was been worsening towards the end of last week, therefore he missed Friday and Monday HD. His last session was on Wednesday 11/30 and he states it was not a full treatment session.  He states the shortness of breath is worse with  getting up and moving around around. At baseline he walks with a walker.  He endorses sharp chest pain that has been ongoing for 2-3 days. He states that pain is 9/10 and he denies trauma to his person. He reports he has never had pain like this before. He denies extremity changes.  He denies vision changes, dysphagia, fever, nausea, vomiting, diarrhea.   Hospital Course:  Patient presented with volume overload and acute hypoxic respiratory failure with O2 in the mid 80s and hyperkalemia all secondary to missed dialysis. He was treated with hemodialysis 12/7 and 12/8. On day of discharge he was feeling at his baseline and breathing comfortably on room air. He had chest pain x2 while in the hospital, both times ischemic work-up was negative. We stressed the importance of dialysis compliance. His next dialysis will be tomorrow.  Procedures: hemodialysis   Consultations: nephrology  Discharge Exam: Vitals:   06/17/21 1515 06/17/21 1530  BP: 119/80 136/77  Pulse: 79 75  Resp: 14 13  Temp:    SpO2: 94% 97%    General exam: Appears calm and comfortable. Chronically ill appearing Respiratory system: Clear to auscultation save for rales at bases Cardiovascular system: S1 & S2 heard, irreg irreg. Soft systolic murmur. Edema to abdomen Gastrointestinal system: Abdomen is nondistended, soft and nontender. No organomegaly or masses felt. Normal bowel sounds heard. Central nervous system: Alert and oriented. No focal neurological deficits. Extremities: Symmetric 5 x 5 power. Skin: bandaged ulcer left anterior shin Psychiatry: Judgement and insight appear normal. Mood & affect appropriate.   Discharge Instructions   Discharge Instructions     Diet - low sodium heart healthy   Complete  by: As directed    Discharge wound care:   Complete by: As directed    Follow instructions from wound care clinic   Increase activity slowly   Complete by: As directed       Allergies as of 06/17/2021        Reactions   Cyclobenzaprine Nausea Only   Clonidine Other (See Comments)   Asymptomatic bradycardia to 38   Furosemide Other (See Comments)   Caused renal failure   Tylenol [acetaminophen] Other (See Comments)   Reaction:  Pt states it bothers his liver        Medication List     TAKE these medications    albuterol 108 (90 Base) MCG/ACT inhaler Commonly known as: VENTOLIN HFA Inhale 2 puffs into the lungs every 6 (six) hours as needed for wheezing or shortness of breath.   apixaban 5 MG Tabs tablet Commonly known as: Eliquis Take 1 tablet (5 mg total) by mouth 2 (two) times daily.   atorvastatin 40 MG tablet Commonly known as: LIPITOR Take 1 tablet (40 mg total) by mouth at bedtime.   Auryxia 1 GM 210 MG(Fe) tablet Generic drug: ferric citrate Take 210 mg by mouth See admin instructions. Take 1 tablet (210mg ) by mouth three times daily with meals and take 1 tablet (210mg ) by mouth with snacks   bumetanide 1 MG tablet Commonly known as: BUMEX Take 1 mg by mouth See admin instructions. Take 1 mg by mouth twice daily on non-dialysis days   carvedilol 6.25 MG tablet Commonly known as: COREG Take 6.25 mg by mouth 2 (two) times daily.   [facility] Senna 8.6 MG tablet Generic drug: senna Take 2 tablets by mouth daily as needed.   dextromethorphan-guaiFENesin 30-600 MG 12hr tablet Commonly known as: MUCINEX DM Take 1 tablet by mouth 2 (two) times daily as needed for cough.   diclofenac Sodium 1 % Gel Commonly known as: VOLTAREN Apply 2 g topically 4 (four) times daily.   hydrALAZINE 100 MG tablet Commonly known as: APRESOLINE Take 1 tablet (100 mg total) by mouth 3 (three) times daily. What changed: when to take this   isosorbide mononitrate 60 MG 24 hr tablet Commonly known as: IMDUR Take 1 tablet (60 mg total) by mouth daily.   ondansetron 4 MG tablet Commonly known as: ZOFRAN Take 1 tablet (4 mg total) by mouth every 6 (six) hours as needed for nausea.    oxyCODONE 20 mg 12 hr tablet Commonly known as: OXYCONTIN Take 20 mg by mouth every 12 (twelve) hours.   oxyCODONE 5 MG immediate release tablet Commonly known as: Oxy IR/ROXICODONE Take 10 mg by mouth every 4 (four) hours as needed for severe pain.   pantoprazole 40 MG tablet Commonly known as: Protonix Take 1 tablet (40 mg total) by mouth daily.   polyethylene glycol-electrolytes 420 g solution Commonly known as: NuLYTELY Take 17 g by mouth daily as needed (constipation).   tamsulosin 0.4 MG Caps capsule Commonly known as: FLOMAX Take 0.4 mg by mouth daily.               Discharge Care Instructions  (From admission, onward)           Start     Ordered   06/17/21 0000  Discharge wound care:       Comments: Follow instructions from wound care clinic   06/17/21 1541           Allergies  Allergen Reactions   Cyclobenzaprine Nausea Only  Clonidine Other (See Comments)    Asymptomatic bradycardia to 38   Furosemide Other (See Comments)    Caused renal failure   Tylenol [Acetaminophen] Other (See Comments)    Reaction:  Pt states it bothers his liver      The results of significant diagnostics from this hospitalization (including imaging, microbiology, ancillary and laboratory) are listed below for reference.    Significant Diagnostic Studies: DG Chest Port 1 View  Result Date: 06/17/2021 CLINICAL DATA:  Chest pain, shortness of breath EXAM: PORTABLE CHEST 1 VIEW COMPARISON:  Chest radiograph 06/15/2021 FINDINGS: The heart is markedly enlarged, unchanged. The upper mediastinal contours are stable. There is vascular congestion without evidence of overt pulmonary edema. There are patchy retrocardiac opacities, unchanged. There is no new or worsening airspace disease. There is no pleural effusion or pneumothorax. There is no acute osseous abnormality. IMPRESSION: 1. Unchanged cardiomegaly with vascular congestion but no overt pulmonary edema. 2. Patchy  opacities in the left base may reflect atelectasis, infection, or aspiration. Electronically Signed   By: Valetta Mole M.D.   On: 06/17/2021 11:09   DG Chest Portable 1 View  Result Date: 06/15/2021 CLINICAL DATA:  Shortness of breath EXAM: PORTABLE CHEST 1 VIEW COMPARISON:  Chest x-ray 01/05/2021 FINDINGS: Heart is enlarged. Mediastinum appears grossly stable. Calcified plaques in the aortic arch. Pulmonary vascular congestion with no focal consolidation visualized. No significant pleural effusion. No pneumothorax. IMPRESSION: Cardiomegaly and pulmonary vascular congestion. Electronically Signed   By: Ofilia Neas M.D.   On: 06/15/2021 18:24    Microbiology: Recent Results (from the past 240 hour(s))  Resp Panel by RT-PCR (Flu A&B, Covid) Nasopharyngeal Swab     Status: None   Collection Time: 06/15/21  6:01 PM   Specimen: Nasopharyngeal Swab; Nasopharyngeal(NP) swabs in vial transport medium  Result Value Ref Range Status   SARS Coronavirus 2 by RT PCR NEGATIVE NEGATIVE Final    Comment: (NOTE) SARS-CoV-2 target nucleic acids are NOT DETECTED.  The SARS-CoV-2 RNA is generally detectable in upper respiratory specimens during the acute phase of infection. The lowest concentration of SARS-CoV-2 viral copies this assay can detect is 138 copies/mL. A negative result does not preclude SARS-Cov-2 infection and should not be used as the sole basis for treatment or other patient management decisions. A negative result may occur with  improper specimen collection/handling, submission of specimen other than nasopharyngeal swab, presence of viral mutation(s) within the areas targeted by this assay, and inadequate number of viral copies(<138 copies/mL). A negative result must be combined with clinical observations, patient history, and epidemiological information. The expected result is Negative.  Fact Sheet for Patients:  EntrepreneurPulse.com.au  Fact Sheet for Healthcare  Providers:  IncredibleEmployment.be  This test is no t yet approved or cleared by the [place] FDA and  has been authorized for detection and/or diagnosis of SARS-CoV-2 by FDA under an Emergency Use Authorization (EUA). This EUA will remain  in effect (meaning this test can be used) for the duration of the COVID-19 declaration under Section 564(b)(1) of the Act, 21 U.S.C.section 360bbb-3(b)(1), unless the authorization is terminated  or revoked sooner.       Influenza A by PCR NEGATIVE NEGATIVE Final   Influenza B by PCR NEGATIVE NEGATIVE Final    Comment: (NOTE) The Xpert Xpress SARS-CoV-2/FLU/RSV plus assay is intended as an aid in the diagnosis of influenza from Nasopharyngeal swab specimens and should not be used as a sole basis for treatment. Nasal washings and aspirates are unacceptable for  Xpert Xpress SARS-CoV-2/FLU/RSV testing.  Fact Sheet for Patients: EntrepreneurPulse.com.au  Fact Sheet for Healthcare Providers: IncredibleEmployment.be  This test is not yet approved or cleared by the [place] FDA and has been authorized for detection and/or diagnosis of SARS-CoV-2 by FDA under an Emergency Use Authorization (EUA). This EUA will remain in effect (meaning this test can be used) for the duration of the COVID-19 declaration under Section 564(b)(1) of the Act, 21 U.S.C. section 360bbb-3(b)(1), unless the authorization is terminated or revoked.  Performed at [facility], [address] [place], [place] [address]   MRSA Next Gen by PCR, Nasal     Status: None   Collection Time: 06/16/21  8:30 PM   Specimen: Nasal Mucosa; Nasal Swab  Result Value Ref Range Status   MRSA by PCR Next Gen NOT DETECTED NOT DETECTED Final    Comment: (NOTE) The GeneXpert MRSA Assay (FDA approved for NASAL specimens only), is one component of a comprehensive MRSA colonization surveillance program. It is not  intended to diagnose MRSA infection nor to guide or monitor treatment for MRSA infections. Test performance is not FDA approved in patients less than 49 years old. Performed at [facility], [address] [place], [place] [address]      Labs: Basic Metabolic Panel: Recent Labs  Lab 06/15/21 1724 06/15/21 2012 06/16/21 0600 06/17/21 0438  NA 138  --  136 139  K 5.5*  --  5.8* 4.6  CL 100  --  100 97*  CO2 24  --  24 28  GLUCOSE 95  --  91 96  BUN 78*  --  84* 51*  CREATININE 10.49*  --  10.69* 7.48*  CALCIUM 8.3*  --  8.1* 8.0*  MG  --  2.4  --   --   PHOS  --  8.6*  --  7.3*   Liver Function Tests: Recent Labs  Lab 06/15/21 1724 06/17/21 0438  AST 20  --   ALT 18  --   ALKPHOS 96  --   BILITOT 1.0  --   PROT 7.3  --   ALBUMIN 3.3* 3.0*   No results for input(s): LIPASE, AMYLASE in the last 168 hours. No results for input(s): AMMONIA in the last 168 hours. CBC: Recent Labs  Lab 06/15/21 1724 06/16/21 0600 06/17/21 0438  WBC 5.5 5.5 5.4  NEUTROABS 3.5  --   --   HGB 9.1* 9.0* 8.6*  HCT 28.5* 27.5* 26.6*  MCV 89.3 90.2 90.2  PLT 104* 100* 92*   Cardiac Enzymes: No results for input(s): CKTOTAL, CKMB, CKMBINDEX, TROPONINI in the last 168 hours. BNP: BNP (last 3 results) Recent Labs    01/05/21 0234 06/15/21 2012  BNP 4,499.0* >4,500.0*    ProBNP (last 3 results) No results for input(s): PROBNP in the last 8760 hours.  CBG: No results for input(s): GLUCAP in the last 168 hours.     Signed:  Desma Maxim MD.  [facility] 06/17/2021, 3:41 PM

## 2021-06-17 NOTE — TOC Initial Note (Signed)
Transition of Care [facility]) - Initial/Assessment Note    Patient Details  Name: [name] MRN: [id number] Date of Birth: [birth year]  [facility] CM/SW Contact:    Beverly Sessions, RN Phone Number: 06/17/2021, 4:00 PM  Clinical Narrative:                 Notified heart failure navigator of Heart failure screen consult Patient currently off the floor and unable to complete Extreme risk assessment         Patient Goals and CMS Choice        Expected Discharge Plan and Services           Expected Discharge Date: 06/17/21                                    Prior Living Arrangements/Services                       Activities of Daily Living Home Assistive Devices/Equipment: Gilford Rile (specify type) ADL Screening (condition at time of admission) Patient's cognitive ability adequate to safely complete daily activities?: Yes Is the patient deaf or have difficulty hearing?: No Does the patient have difficulty seeing, even when wearing glasses/contacts?: No Does the patient have difficulty concentrating, remembering, or making decisions?: No Patient able to express need for assistance with ADLs?: Yes Does the patient have difficulty dressing or bathing?: No Independently performs ADLs?: Yes (appropriate for developmental age) Does the patient have difficulty walking or climbing stairs?: Yes Weakness of Legs: Both Weakness of Arms/Hands: None  Permission Sought/Granted                  Emotional Assessment              Admission diagnosis:  Volume overload [E87.70] Hypervolemia, unspecified hypervolemia type [E87.70] Dyspnea, unspecified type [R06.00] Patient Active Problem List   Diagnosis Date Noted   Cirrhosis of liver ([place]) 06/16/2021   Esophageal varices ([place]) 06/16/2021   Hyperphosphatemia 06/15/2021   Persistent atrial fibrillation (HCC)    HFrEF (heart failure with reduced ejection fraction) ([place])    Chest pain  01/05/2021   Cough    CAD (coronary artery disease) 01/05/2020   Chronic anticoagulation 01/05/2020   Acute bronchitis 01/05/2020   AF (paroxysmal atrial fibrillation) ([place]) 01/05/2020   History of substance abuse ([place]) 10/24/2019   Elevated troponin 10/24/2019   Nonspecific chest pain 10/24/2019   Chronic diastolic CHF (congestive heart failure) ([place]) 09/28/2019   History of MI (myocardial infarction) 09/28/2019   Anemia of chronic kidney failure, stage 5 ([facility]) 09/28/2019   History of pulmonary embolism 09/28/2019   Chronic midline low back pain    Acute respiratory failure with hypoxia ([place])    Acute diastolic CHF (congestive heart failure) ([place])    Anemia of chronic disease    Thrombocytopenia (HCC)    Congestive heart failure (HCC)    Fluid overload 06/21/2019   Volume overload 11/15/2018   ESRD (end stage renal disease) on dialysis ([place]) 11/14/2018   HCAP (healthcare-associated pneumonia) 08/27/2018   SOB (shortness of breath) 08/25/2018   Chest pain of uncertain etiology 85/08/7739   ESRD on dialysis ([place]) 11/08/2016   Prostate cancer screening 11/08/2016   Acquired cyst of kidney 11/08/2016   Urinary urgency 11/08/2016   Acute on chronic renal failure ([place]) 09/23/2016   Hypertensive urgency 09/21/2016   Dysthymia 02/26/2016  Chronic pain 02/26/2016   Noncompliance with renal dialysis ([place]) 02/26/2016   Opiate abuse, continuous ([place]) 02/03/2016   Substance induced mood disorder ([place]) 02/03/2016   Antisocial personality disorder ([place]) 02/03/2016   Left-sided weakness 01/18/2016   HEPATITIS C 06/28/2008   HLD (hyperlipidemia) 06/28/2008   SUBSTANCE ABUSE, MULTIPLE 06/28/2008   Hypertension 06/27/2008   PCP:  [facility] [place]:   [id number] [place], [place] [address] [place] [address] Phone: [phone] Fax: [phone]  [id number] - [place], [place] [address] [place] [address] Phone: [phone] Fax: [phone]  [facility] [id number], [place] [address] [facility] [address] [address] Phone: [phone] Fax: [phone]     Social Determinants of Health (SDOH) Interventions    Readmission Risk Interventions Readmission Risk Prevention Plan 11/16/2018 11/14/2018  Transportation Screening Complete Complete  Medication Review Press photographer) Complete Complete  PCP or Specialist appointment within 3-5 days of discharge Complete -  [place] or Home Care Consult Patient refused -  Palliative Care Screening Not Applicable -  [place] Not Applicable Not Applicable  Some recent data might be hidden

## 2021-06-17 NOTE — Significant Event (Signed)
Rapid Response Event Note   Reason for Call :  Chest pain  Initial Focused Assessment:   Dr. Juleen China and Dr. Si Raider at bedside.  Patient complaining of chest pain- vitals stable patient alert and oriented.    Interventions:  Dr. Juleen China ordered chest xray, troponins and EFG- patient rufusing to take nitro- he states that it has given him a terrible headache in the past- Dr. Juleen China aware.  Plan of Care:  Once xray and troponin taken- may start patient back on dialysis per Dr. Juleen China.   Event Summary:   MD Notified:  Call Time: Arrival Time: End Time:  Penne Lash, RN

## 2021-07-27 NOTE — Progress Notes (Deleted)
Cardiology Office Note:    Date:  07/27/2021   ID:  [name], DOB 1956/05/09, MRN [id number]  PCP:  [facility] [place] Cardiologist:  Kathlyn Sacramento, MD  [place] Electrophysiologist:  None   Referring MD: [facility] [place] [facility]*   Chief Complaint: Hospital follow-up  History of Present Illness:    [name] is a 66 y.o. male with a hx of recurrent chest pain with nonobstructive CAD by LHC on 09/30/2019, HFmrEF, dilated aortic root, ESRD on HD with nonadherence, recurrent PE treated with anticoagulation, substance abuse including cocaine and tobacco, cirrhosis, anemia of chronic disease, HTN, and chronic back pain who is being seen for hospital follow-up.   Mr. Tester was admitted in 09/2019 with chest pain and mildly elevated troponin.  Diagnostic LHC was performed on 09/30/2019 and revealed minimal, nonobstructive CAD.  Echo at that time showed an EF of 55 to 60%, no regional wall motion abnormalities, grade 2 diastolic dysfunction, normal RV systolic function and ventricular cavity size, mildly dilated left atrium, and a dilated aortic root measuring 4.5 cm.  He has not followed up as an outpatient with [facility] or [facility] cardiology.   He has been seen in the ED at [facility], and [facility] multiple times this year for complaints of chest pain, malaise, and dyspnea.  He was admitted to [facility] in late 09/2020 with chest pain with work-up felt to be noncardiac with multiple stable EKGs and flat/downtrending high-sensitivity troponins.  Symptoms were felt to be related to volume overload in the setting of missed hemodialysis, constipation, and gas.  He was admitted in 10/2020 with similar symptoms of chest pain and volume overload in the setting of noncompliance with hemodialysis.  CTA of the chest was negative for PE at that time.  Since that admission, he has been seen in the [facility] ED twice, once in late 11/2020 for shortness of breath, and most recently on 12/20/2020  for chest pain.  Symptoms were felt to be in the setting of volume overload and improved with hemodialysis.  Patient was admitted 12/2020 for chest pain. CTA negative fro PE. HS trop peaked at 73, with overall flat trend. Lexiscan Myoview on 6/29 was low risk w/o ischemia. Discharged on cardiac medications, placed back on Eliquis for afib.    Admitted in December 2022 for CHF and hypoxic respiratory failure and hyperkalemia secondary to missed dialysis.   Today,    Past Medical History:  Diagnosis Date   Depression    Diastolic dysfunction    a. 09/2019 Echo: EF 55-60%, no rwma, mod LVH, gr2 DD. Nl RV size/fxn. Mildly dil LA. Ao root 4.5cm.   Dilated aortic root ([place])    a. 09/2019 Echo: Ao root 4.5cm.   Elevated troponin level not due myocardial infarction    ESRD (end stage renal disease) ([place])    Hypertension    Nonobstructive Coronary Artery Disease    a. 09/2019 Cath: LM nl, LAD min irregs, D1 nl, D2 min irregs, LCX large, min irregs, RCA large, 20p, 30p/m.   PE (pulmonary embolism)    Renal insufficiency     Past Surgical History:  Procedure Laterality Date   LEFT HEART CATH AND CORONARY ANGIOGRAPHY N/A 09/30/2019   Procedure: LEFT HEART CATH AND CORONARY ANGIOGRAPHY;  Surgeon: Wellington Hampshire, MD;  Location: [place] [facility];  Service: Cardiovascular;  Laterality: N/A;   TOTAL HIP ARTHROPLASTY      Current Medications: No outpatient medications have been  marked as taking for the 07/30/21 encounter (Appointment) with Kathlen Mody, Livvy Spilman H, PA-C.     Allergies:   Cyclobenzaprine, Clonidine, Furosemide, and Tylenol [acetaminophen]   Social History   Socioeconomic History   Marital status: Divorced    Spouse name: Not on file   Number of children: Not on file   Years of education: Not on file   Highest education level: Not on file  Occupational History   Not on file  Tobacco Use   Smoking status: Every Day    Packs/day: 0.50    Types: Cigarettes   Smokeless tobacco:  Never  Substance and Sexual Activity   Alcohol use: No   Drug use: No   Sexual activity: Not on file  Other Topics Concern   Not on file  Social History Narrative   Not on file   Social Determinants of Health   Financial Resource Strain: Not on file  Food Insecurity: Not on file  Transportation Needs: Not on file  Physical Activity: Not on file  Stress: Not on file  Social Connections: Not on file     Family History: The patient's family history includes Hypertension in his mother.  ROS:   Please see the history of present illness.     All other systems reviewed and are negative.  EKGs/Labs/Other Studies Reviewed:    The following studies were reviewed today:  Cardiac Catheterization  3.22.21         Left Main   Vessel is large. Vessel is angiographically normal.   Left Anterior Descending    The vessel exhibits minimal luminal irregularities.    First Diagonal Branch      Vessel is angiographically normal.      Second Diagonal Branch    The vessel exhibits minimal luminal irregularities.    Left Circumflex  Vessel is large. The vessel exhibits minimal luminal irregularities.  Right Coronary Artery     Vessel is large.     Prox RCA lesion 20% stenosed     Prox RCA lesion is 20% stenosed.     Prox RCA to Mid RCA lesion 30% stenosed     Prox RCA to Mid RCA lesion is 30% stenosed.     _____________      2D echo 01/05/2021: 1. Left ventricular ejection fraction, by estimation, is 40 to 45%. The  left ventricle has mildly decreased function. The left ventricle  demonstrates global hypokinesis. The left ventricular internal cavity size  was mildly dilated. There is mild left  ventricular hypertrophy. Left ventricular diastolic parameters are  indeterminate. The average left ventricular global longitudinal strain is  -8.9 %. The global longitudinal strain is abnormal.   2. Right ventricular systolic function is moderately reduced. The right  ventricular size is  moderately enlarged.   3. Left atrial size was moderately dilated.   4. Right atrial size was mildly dilated.   5. A small pericardial effusion is present. The pericardial effusion is  posterior to the left ventricle.   6. The mitral valve is degenerative. Mild mitral valve regurgitation.   7. The aortic valve has an indeterminant number of cusps. There is mild  calcification of the aortic valve. There is severe thickening of the  aortic valve. Aortic valve regurgitation is mild. Mild to moderate aortic  valve sclerosis/calcification is  present, without any evidence of aortic stenosis.   8. Aortic dilatation noted. There is moderate dilatation of the aortic  root, measuring 47 mm. There is moderate-severe dilatation of the  ascending aorta, measuring 49 mm.   9. The inferior vena cava is dilated in size with <50% respiratory  variability, suggesting right atrial pressure of 15 mmHg. __________   Wille Glaser 6.29.2022   There was no ST segment deviation noted during stress. No T wave inversion was noted during stress. This is a low risk study. The left ventricular ejection fraction is mildly decreased (46%). There is significant perfusion defects on both stress and rest images which appear artefactual (motion, diaphragmatic). There is no clear evidence for ischemia. _____________   EKG:  EKG is *** ordered today.  The ekg ordered today demonstrates ***  Recent Labs: 06/15/2021: ALT 18; B Natriuretic Peptide >4,500.0; Magnesium 2.4 06/17/2021: BUN 51; Creatinine, Ser 7.48; Hemoglobin 8.6; Platelets 92; Potassium 4.6; Sodium 139  Recent Lipid Panel    Component Value Date/Time   CHOL 135 09/28/2019 0018   CHOL 158 10/20/2014 0201   TRIG 46 09/28/2019 0018   TRIG 104 10/20/2014 0201   HDL 48 09/28/2019 0018   HDL 27 (L) 10/20/2014 0201   CHOLHDL 2.8 09/28/2019 0018   VLDL 9 09/28/2019 0018   VLDL 21 10/20/2014 0201   LDLCALC 78 09/28/2019 0018   LDLCALC 110 (H)  10/20/2014 0201     Risk Assessment/Calculations:   {Does this patient have ATRIAL FIBRILLATION?:[phone]}   Physical Exam:    VS:  There were no vitals taken for this visit.    Wt Readings from Last 3 Encounters:  06/17/21 281 lb 15.5 oz (127.9 kg)  03/13/21 257 lb (116.6 kg)  01/07/21 256 lb (116.1 kg)     GEN: *** Well nourished, well developed in no acute distress HEENT: Normal NECK: No JVD; No carotid bruits LYMPHATICS: No lymphadenopathy CARDIAC: ***RRR, no murmurs, rubs, gallops RESPIRATORY:  Clear to auscultation without rales, wheezing or rhonchi  ABDOMEN: Soft, non-tender, non-distended MUSCULOSKELETAL:  No edema; No deformity  SKIN: Warm and dry NEUROLOGIC:  Alert and oriented x 3 PSYCHIATRIC:  Normal affect   ASSESSMENT:    No diagnosis found. PLAN:    In order of problems listed above:  HFmrEF/NICM   Atypical chest pain  Persistent Afib  HL  ESRD  Dilated Ao root  Disposition: Follow up {follow up:15908} with ***   Shared Decision Making/Informed Consent   {Are you ordering a CV Procedure (e.g. stress test, cath, DCCV, TEE, etc)?   Press F2        :[id number]}    Signed, Bee Hammerschmidt Ninfa Meeker, PA-C  07/27/2021 2:36 PM    [facility]

## 2021-07-30 ENCOUNTER — Ambulatory Visit: Payer: Medicare Other | Admitting: Medical

## 2021-08-07 ENCOUNTER — Other Ambulatory Visit: Payer: Self-pay

## 2021-08-07 ENCOUNTER — Emergency Department: Payer: No Typology Code available for payment source

## 2021-08-07 ENCOUNTER — Inpatient Hospital Stay
Admission: EM | Admit: 2021-08-07 | Discharge: 2021-08-10 | DRG: 640 | Disposition: A | Discharge status: Home Health | Payer: No Typology Code available for payment source | Attending: Internal Medicine | Admitting: Internal Medicine

## 2021-08-07 ENCOUNTER — Encounter: Payer: Self-pay | Admitting: Emergency Medicine

## 2021-08-07 DIAGNOSIS — N186 End stage renal disease: Secondary | ICD-10-CM

## 2021-08-07 DIAGNOSIS — E8770 Fluid overload, unspecified: Secondary | ICD-10-CM | POA: Diagnosis not present

## 2021-08-07 DIAGNOSIS — Z886 Allergy status to analgesic agent status: Secondary | ICD-10-CM

## 2021-08-07 DIAGNOSIS — I132 Hypertensive heart and chronic kidney disease with heart failure and with stage 5 chronic kidney disease, or end stage renal disease: Secondary | ICD-10-CM | POA: Diagnosis present

## 2021-08-07 DIAGNOSIS — Z9115 Patient's noncompliance with renal dialysis: Secondary | ICD-10-CM

## 2021-08-07 DIAGNOSIS — Z888 Allergy status to other drugs, medicaments and biological substances status: Secondary | ICD-10-CM

## 2021-08-07 DIAGNOSIS — D696 Thrombocytopenia, unspecified: Secondary | ICD-10-CM | POA: Diagnosis present

## 2021-08-07 DIAGNOSIS — L97919 Non-pressure chronic ulcer of unspecified part of right lower leg with unspecified severity: Secondary | ICD-10-CM | POA: Diagnosis present

## 2021-08-07 DIAGNOSIS — K219 Gastro-esophageal reflux disease without esophagitis: Secondary | ICD-10-CM | POA: Diagnosis present

## 2021-08-07 DIAGNOSIS — R0602 Shortness of breath: Secondary | ICD-10-CM | POA: Diagnosis not present

## 2021-08-07 DIAGNOSIS — E785 Hyperlipidemia, unspecified: Secondary | ICD-10-CM | POA: Diagnosis present

## 2021-08-07 DIAGNOSIS — N185 Chronic kidney disease, stage 5: Secondary | ICD-10-CM | POA: Diagnosis present

## 2021-08-07 DIAGNOSIS — N2581 Secondary hyperparathyroidism of renal origin: Secondary | ICD-10-CM | POA: Diagnosis present

## 2021-08-07 DIAGNOSIS — G4733 Obstructive sleep apnea (adult) (pediatric): Secondary | ICD-10-CM

## 2021-08-07 DIAGNOSIS — U071 COVID-19: Secondary | ICD-10-CM | POA: Diagnosis present

## 2021-08-07 DIAGNOSIS — Z992 Dependence on renal dialysis: Secondary | ICD-10-CM

## 2021-08-07 DIAGNOSIS — K7460 Unspecified cirrhosis of liver: Secondary | ICD-10-CM | POA: Diagnosis present

## 2021-08-07 DIAGNOSIS — Z9989 Dependence on other enabling machines and devices: Secondary | ICD-10-CM

## 2021-08-07 DIAGNOSIS — R079 Chest pain, unspecified: Secondary | ICD-10-CM | POA: Diagnosis present

## 2021-08-07 DIAGNOSIS — Z79899 Other long term (current) drug therapy: Secondary | ICD-10-CM

## 2021-08-07 DIAGNOSIS — Z6838 Body mass index (BMI) 38.0-38.9, adult: Secondary | ICD-10-CM

## 2021-08-07 DIAGNOSIS — I5023 Acute on chronic systolic (congestive) heart failure: Secondary | ICD-10-CM | POA: Diagnosis present

## 2021-08-07 DIAGNOSIS — D638 Anemia in other chronic diseases classified elsewhere: Secondary | ICD-10-CM | POA: Diagnosis present

## 2021-08-07 DIAGNOSIS — R34 Anuria and oliguria: Secondary | ICD-10-CM | POA: Diagnosis present

## 2021-08-07 DIAGNOSIS — I2510 Atherosclerotic heart disease of native coronary artery without angina pectoris: Secondary | ICD-10-CM | POA: Diagnosis present

## 2021-08-07 DIAGNOSIS — N400 Enlarged prostate without lower urinary tract symptoms: Secondary | ICD-10-CM | POA: Diagnosis present

## 2021-08-07 DIAGNOSIS — R7989 Other specified abnormal findings of blood chemistry: Secondary | ICD-10-CM | POA: Diagnosis present

## 2021-08-07 DIAGNOSIS — Z7901 Long term (current) use of anticoagulants: Secondary | ICD-10-CM

## 2021-08-07 DIAGNOSIS — I890 Lymphedema, not elsewhere classified: Secondary | ICD-10-CM | POA: Diagnosis present

## 2021-08-07 DIAGNOSIS — L97929 Non-pressure chronic ulcer of unspecified part of left lower leg with unspecified severity: Secondary | ICD-10-CM | POA: Diagnosis present

## 2021-08-07 DIAGNOSIS — D631 Anemia in chronic kidney disease: Secondary | ICD-10-CM | POA: Diagnosis present

## 2021-08-07 DIAGNOSIS — J9601 Acute respiratory failure with hypoxia: Secondary | ICD-10-CM | POA: Diagnosis not present

## 2021-08-07 DIAGNOSIS — E875 Hyperkalemia: Secondary | ICD-10-CM | POA: Diagnosis present

## 2021-08-07 DIAGNOSIS — I4819 Other persistent atrial fibrillation: Secondary | ICD-10-CM | POA: Diagnosis present

## 2021-08-07 DIAGNOSIS — Z86711 Personal history of pulmonary embolism: Secondary | ICD-10-CM

## 2021-08-07 DIAGNOSIS — G8929 Other chronic pain: Secondary | ICD-10-CM | POA: Diagnosis present

## 2021-08-07 DIAGNOSIS — G894 Chronic pain syndrome: Secondary | ICD-10-CM | POA: Diagnosis present

## 2021-08-07 DIAGNOSIS — F17210 Nicotine dependence, cigarettes, uncomplicated: Secondary | ICD-10-CM | POA: Diagnosis present

## 2021-08-07 DIAGNOSIS — R778 Other specified abnormalities of plasma proteins: Secondary | ICD-10-CM | POA: Diagnosis present

## 2021-08-07 DIAGNOSIS — E669 Obesity, unspecified: Secondary | ICD-10-CM

## 2021-08-07 LAB — RESP PANEL BY RT-PCR (FLU A&B, COVID) ARPGX2
Influenza A by PCR: NEGATIVE
Influenza B by PCR: NEGATIVE
SARS Coronavirus 2 by RT PCR: POSITIVE — AB

## 2021-08-07 LAB — CBC WITH DIFFERENTIAL/PLATELET
Abs Immature Granulocytes: 0.01 10*3/uL (ref 0.00–0.07)
Basophils Absolute: 0 10*3/uL (ref 0.0–0.1)
Basophils Relative: 1 %
Eosinophils Absolute: 0.2 10*3/uL (ref 0.0–0.5)
Eosinophils Relative: 4 %
HCT: 27 % — ABNORMAL LOW (ref 39.0–52.0)
Hemoglobin: 8.9 g/dL — ABNORMAL LOW (ref 13.0–17.0)
Immature Granulocytes: 0 %
Lymphocytes Relative: 21 %
Lymphs Abs: 1 10*3/uL (ref 0.7–4.0)
MCH: 29 pg (ref 26.0–34.0)
MCHC: 33 g/dL (ref 30.0–36.0)
MCV: 87.9 fL (ref 80.0–100.0)
Monocytes Absolute: 0.6 10*3/uL (ref 0.1–1.0)
Monocytes Relative: 13 %
Neutro Abs: 2.8 10*3/uL (ref 1.7–7.7)
Neutrophils Relative %: 61 %
Platelets: 81 10*3/uL — ABNORMAL LOW (ref 150–400)
RBC: 3.07 MIL/uL — ABNORMAL LOW (ref 4.22–5.81)
RDW: 15.6 % — ABNORMAL HIGH (ref 11.5–15.5)
WBC: 4.6 10*3/uL (ref 4.0–10.5)
nRBC: 0 % (ref 0.0–0.2)

## 2021-08-07 LAB — BASIC METABOLIC PANEL
Anion gap: 11 (ref 5–15)
BUN: 51 mg/dL — ABNORMAL HIGH (ref 8–23)
CO2: 27 mmol/L (ref 22–32)
Calcium: 9.2 mg/dL (ref 8.9–10.3)
Chloride: 99 mmol/L (ref 98–111)
Creatinine, Ser: 6.58 mg/dL — ABNORMAL HIGH (ref 0.61–1.24)
GFR, Estimated: 9 mL/min — ABNORMAL LOW (ref >60)
Glucose, Bld: 90 mg/dL (ref 70–99)
Potassium: 5.3 mmol/L — ABNORMAL HIGH (ref 3.5–5.1)
Sodium: 137 mmol/L (ref 135–145)

## 2021-08-07 LAB — TROPONIN I (HIGH SENSITIVITY): Troponin I (High Sensitivity): 75 ng/L — ABNORMAL HIGH (ref <18)

## 2021-08-07 MED ORDER — SODIUM ZIRCONIUM CYCLOSILICATE 10 G PO PACK
10.0000 g | PACK | Freq: Once | ORAL | Status: AC
  Administered 2021-08-07: 10 g via ORAL
  Filled 2021-08-07: qty 1

## 2021-08-07 MED ORDER — OXYCODONE HCL 5 MG PO TABS: 10.0000 mg | ORAL_TABLET | ORAL | Status: DC | PRN

## 2021-08-07 MED ORDER — ONDANSETRON HCL 4 MG/2ML IJ SOLN: 4.0000 mg | Freq: Four times a day (QID) | INTRAMUSCULAR | Status: DC | PRN

## 2021-08-07 MED ORDER — POLYETHYLENE GLYCOL 3350 17 G PO PACK: 17.0000 g | PACK | Freq: Every day | ORAL | Status: DC | PRN

## 2021-08-07 MED ORDER — DIPHENHYDRAMINE HCL 50 MG/ML IJ SOLN
12.5000 mg | Freq: Four times a day (QID) | INTRAMUSCULAR | Status: AC | PRN
  Administered 2021-08-07 – 2021-08-08 (×2): 12.5 mg via INTRAVENOUS
  Filled 2021-08-07 (×2): qty 1

## 2021-08-07 MED ORDER — ONDANSETRON HCL 4 MG PO TABS: 4.0000 mg | ORAL_TABLET | Freq: Four times a day (QID) | ORAL | Status: DC | PRN

## 2021-08-07 MED ORDER — OXYCODONE HCL 5 MG PO TABS
10.0000 mg | ORAL_TABLET | Freq: Once | ORAL | Status: AC
  Administered 2021-08-07: 10 mg via ORAL
  Filled 2021-08-07: qty 2

## 2021-08-07 MED ORDER — ACETAMINOPHEN 325 MG PO TABS
650.0000 mg | ORAL_TABLET | Freq: Four times a day (QID) | ORAL | Status: DC | PRN
  Administered 2021-08-08: 650 mg via ORAL
  Filled 2021-08-07 (×2): qty 2

## 2021-08-07 MED ORDER — APIXABAN 5 MG PO TABS
5.0000 mg | ORAL_TABLET | Freq: Two times a day (BID) | ORAL | Status: DC
  Administered 2021-08-07 – 2021-08-10 (×5): 5 mg via ORAL
  Filled 2021-08-07 (×6): qty 1

## 2021-08-07 MED ORDER — OXYCODONE HCL 5 MG PO TABS
10.0000 mg | ORAL_TABLET | Freq: Four times a day (QID) | ORAL | Status: DC | PRN
  Administered 2021-08-08 (×3): 10 mg via ORAL
  Filled 2021-08-07 (×3): qty 2

## 2021-08-07 MED ORDER — FUROSEMIDE 10 MG/ML IJ SOLN
60.0000 mg | Freq: Once | INTRAMUSCULAR | Status: AC
  Administered 2021-08-07: 60 mg via INTRAVENOUS
  Filled 2021-08-07: qty 8

## 2021-08-07 MED ORDER — PEG 3350-KCL-NA BICARB-NACL 420 G PO SOLR: 17.0000 g | Freq: Every day | ORAL | Status: DC | PRN

## 2021-08-07 MED ORDER — ACETAMINOPHEN 650 MG RE SUPP: 650.0000 mg | Freq: Four times a day (QID) | RECTAL | Status: DC | PRN

## 2021-08-07 MED ORDER — MORPHINE SULFATE (PF) 4 MG/ML IV SOLN
4.0000 mg | Freq: Once | INTRAVENOUS | Status: AC
  Administered 2021-08-07: 4 mg via INTRAVENOUS
  Filled 2021-08-07: qty 1

## 2021-08-07 MED ORDER — ZINC OXIDE 20 % EX OINT
TOPICAL_OINTMENT | CUTANEOUS | Status: DC | PRN
  Administered 2021-08-08: 1 via TOPICAL
  Filled 2021-08-07: qty 28.35

## 2021-08-07 MED ORDER — FENTANYL CITRATE PF 50 MCG/ML IJ SOSY
12.5000 ug | PREFILLED_SYRINGE | INTRAMUSCULAR | Status: AC | PRN
  Administered 2021-08-07 – 2021-08-08 (×3): 12.5 ug via INTRAVENOUS
  Filled 2021-08-07 (×3): qty 1

## 2021-08-07 MED ORDER — ATORVASTATIN CALCIUM 20 MG PO TABS
40.0000 mg | ORAL_TABLET | Freq: Every day | ORAL | Status: DC
  Administered 2021-08-07 – 2021-08-09 (×3): 40 mg via ORAL
  Filled 2021-08-07 (×3): qty 2

## 2021-08-07 MED ORDER — CARVEDILOL 6.25 MG PO TABS
6.2500 mg | ORAL_TABLET | Freq: Two times a day (BID) | ORAL | Status: DC
  Administered 2021-08-07 – 2021-08-10 (×4): 6.25 mg via ORAL
  Filled 2021-08-07 (×6): qty 1

## 2021-08-07 MED ORDER — ALBUTEROL SULFATE HFA 108 (90 BASE) MCG/ACT IN AERS
2.0000 | INHALATION_SPRAY | Freq: Four times a day (QID) | RESPIRATORY_TRACT | Status: DC | PRN
  Filled 2021-08-07: qty 6.7

## 2021-08-07 NOTE — Assessment & Plan Note (Signed)
-   Within his normal range - Downtrending - Low clinical suspicion for ACS - This is secondary to volume overload in setting of missing hemodialysis

## 2021-08-07 NOTE — Hospital Course (Signed)
Mr. Athel Merriweather is a 66 year old male with end-stage renal disease on hemodialysis MWF, hypertension, hyperlipidemia, persistent atrial fibrillation on anticoagulation with Eliquis, GERD, BPH, chronic pain syndrome, noncompliance with hemodialysis, who presents emergency department via EMS for chief concerns of shortness of breath.  Per EDP, patient states that he missed hemodialysis on Wednesday, 08/04/2021.  He states that he went to hemodialysis appropriately on 08/06/2021 however he he states that he did not feel they pulled out enough fluids on Friday.  Vitals in the emergency department showed temperature of 97.8, respiration rate of 18, heart rate of 59, blood pressure 145/73, improved to 143/60, SPO2 of 98% on room air.  Serum sodium 137, potassium 5.3, chloride 99, bicarb 27, BUN of 51, serum creatinine of 6.58, GFR of 9, nonfasting blood glucose of 90, WBC 4.6, hemoglobin 8.9, platelets of 81, high sensitive troponin is 75.  COVID/influenza A/influenza B PCR are pending.  In the emergency department patient was given morphine 4 mg IV, Lokelma 10 g p.o. one-time dose.

## 2021-08-07 NOTE — Assessment & Plan Note (Signed)
-   Resumed home atorvastatin 40 mg nightly °

## 2021-08-07 NOTE — Assessment & Plan Note (Addendum)
-   With chronic lower extremity wounds - Zinc cream twice daily ordered - Images uploaded to media - Wound care consulted

## 2021-08-07 NOTE — Assessment & Plan Note (Signed)
-   Resumed home apixaban 5 mg p.o. twice daily, Coreg 6.25 mg p.o. twice daily

## 2021-08-07 NOTE — ED Notes (Signed)
Lab in room at this time. 

## 2021-08-07 NOTE — ED Notes (Signed)
Pt given diet gingerale, graham crackers, and peanut butter per his request.

## 2021-08-07 NOTE — Assessment & Plan Note (Addendum)
-   EDP has consulted nephrology, Dr. Theador Hawthorne via secure chat who states the patient will plan for hemodialysis on 08/08/2021 - BiPAP nightly ordered - Patient states that he is still urinating, he is oliguric - Admit to telemetry cardiac, observation

## 2021-08-07 NOTE — H&P (Addendum)
History and Physical   [name] QPY:[id number] DOB: [birth year] DOA: 08/07/2021  PCP: [facility] [place]  Outpatient Specialists: Dr. Smith Mince, [facility] Patient coming from: Home via EMS  I have personally briefly reviewed patient's old medical records in [place].  Chief Concern: Shortness of breath  HPI: [name] is a 66 year old male with end-stage renal disease on hemodialysis MWF, hypertension, hyperlipidemia, persistent atrial fibrillation on anticoagulation with Eliquis, GERD, BPH, chronic pain syndrome, noncompliance with hemodialysis, who presents emergency department via EMS for chief concerns of shortness of breath.  Per EDP, patient states that he missed hemodialysis on Wednesday, 08/04/2021.  He states that he went to hemodialysis appropriately on 08/06/2021 however he he states that he did not feel they pulled out enough fluids on Friday.  Vitals in the emergency department showed temperature of 97.8, respiration rate of 18, heart rate of 59, blood pressure 145/73, improved to 143/60, SPO2 of 98% on room air.  Serum sodium 137, potassium 5.3, chloride 99, bicarb 27, BUN of 51, serum creatinine of 6.58, GFR of 9, nonfasting blood glucose of 90, WBC 4.6, hemoglobin 8.9, platelets of 81, high sensitive troponin is 75.  COVID/influenza A/influenza B PCR are pending.  In the emergency department patient was given morphine 4 mg IV, Lokelma 10 g p.o. one-time dose.  At bedside patient is able to tell me his name, age, and he is knows he is in the hospital.  He provided the full HPI.  He reports that he has been having shortness of breath that started today.  He reports that at dialysis session, they did not remove any fluids and only 'cleaned his blood' despite his request for them to remove fluid.  He reports that he missed hemodialysis on Wednesday, 08/04/2021 due to an appointment for his lower extremities at the wound clinic.  At bedside he does  have minimal increased use of accessory respiratory muscles.  He reports that he is oliguric.  He reports that shortness of breath is worse with exertion.  He states that the chest pain is persistent and does not go away and is worse when he gets short of breath.  He reports that he has vomited 2-3 times today and it was clear sputum. He denies blood and/or black vomitus.  He denies any fever, new cough, known sick contacts.  He states that he has obstructive sleep apnea and that he wears a CPAP mask at night.  He endorses compliance with fluid restriction, stating that he drinks about 12 ounces of fluid a day.  Social history: He lives at home to help care for his parents who are in their 70s.  He is a former tobacco user and quit 2 months ago.  He states that at his peak 1 pack will last him about 1 week.  He denies EtOH use.  ROS: Constitutional: no weight change, no fever ENT/Mouth: no sore throat, no rhinorrhea Eyes: no eye pain, no vision changes Cardiovascular: no chest pain, + dyspnea,  no edema, no palpitations Respiratory: no cough, no sputum, no wheezing Gastrointestinal: + nausea, + vomiting, no diarrhea, no constipation Genitourinary: no urinary incontinence, no dysuria, no hematuria Musculoskeletal: no arthralgias, no myalgias Skin: no skin lesions, no pruritus, Neuro: + weakness, no loss of consciousness, no syncope Psych: no anxiety, no depression, + decrease appetite Heme/Lymph: no bruising, no bleeding  ED Course: Discussed with emergency medicine provider, patient requiring hospitalization for chief concerns of volume overload and acute respiratory failure  with hypoxia.  Assessment/Plan  Principal Problem:   Acute respiratory failure with hypoxia (HCC) Active Problems:   HLD (hyperlipidemia)   Chronic pain   ESRD on dialysis (HCC)   Volume overload   Anemia of chronic disease   Thrombocytopenia (HCC)   Anemia of chronic kidney failure, stage 5 (HCC)   Elevated  troponin   Chronic anticoagulation   Persistent atrial fibrillation (HCC)   Cirrhosis of liver (HCC)   Lymphedema associated with obesity   OSA on CPAP   Hyperkalemia   * Acute respiratory failure with hypoxia (HCC) Assessment & Plan - Continue oxygen supplementation with 2 L nasal cannula to maintain SPO2 greater than 92% - BiPAP nightly ordered, discussed with patient who is amendable - Presumed secondary to volume overload in setting of hemodialysis noncompliance  Persistent atrial fibrillation (HCC) Assessment & Plan - Resumed home apixaban 5 mg p.o. twice daily, Coreg 6.25 mg p.o. twice daily  OSA on CPAP Assessment & Plan - Patient states he is compliant with CPAP nightly -Given patient's volume overloaded state, I have ordered BiPAP nightly and patient is amendable  ESRD on dialysis [facility]) Assessment & Plan - EDP has consulted nephrology, Dr. Theador Hawthorne via secure chat who states the patient will plan for hemodialysis on 08/08/2021 - BiPAP nightly ordered - Patient states that he is still urinating, he is oliguric - Admit to telemetry cardiac, observation  Thrombocytopenia ([place]) Assessment & Plan - Chronic thrombocytopenia, presumed secondary to chronic liver disease  Hyperkalemia Assessment & Plan - Lokelma, one-time dose in the emergency department - BMP in the a.m.  Lymphedema associated with obesity Assessment & Plan - With chronic lower extremity wounds - Zinc cream twice daily ordered - Images uploaded to media - Wound care consulted  Elevated troponin Assessment & Plan - Within his normal range - Downtrending - Low clinical suspicion for ACS - This is secondary to volume overload in setting of missing hemodialysis  Anemia of chronic disease Assessment & Plan - Hemoglobin on presentation is 8.9, and is within baseline range - CBC in the a.m.  Volume overload Assessment & Plan - Secondary to missed hemodialysis session - Furosemide 60 mg IV one-time  dose - BiPAP nightly ordered - Dr. Theador Hawthorne, nephrology has been consulted via epic order, a.m. team to coordinate care - Strict I's and O's  HLD (hyperlipidemia) Assessment & Plan - Resumed home atorvastatin 40 mg nightly  Chart reviewed.   06/15/2021 - 06/17/2021 hospitalization: Patient presented for shortness of breath was admitted for volume overload. He was treated for volume overload, acute hypoxic respiratory failure with O2 in the mid 80s, hyperkalemia from missed dialysis session.  He was treated with hemodialysis for 2 consecutive days and discharged home.  Complete echo on 01/05/2021: Ejection fraction 40-45 percent, the left ventricle demonstrates global hypokinesis.  Mild left ventricular hypertrophy.  DVT prophylaxis: Apixaban 5 mg p.o. twice daily Code Status: Full code Diet: Renal Family Communication: No Disposition Plan: Pending clinical course, anticipate less than 2 night stay Consults called: Nephrology, Dr. Theador Hawthorne via EDP Admission status: Telemetry cardiac, observation  Past Medical History:  Diagnosis Date   Depression    Diastolic dysfunction    a. 09/2019 Echo: EF 55-60%, no rwma, mod LVH, gr2 DD. Nl RV size/fxn. Mildly dil LA. Ao root 4.5cm.   Dilated aortic root ([place])    a. 09/2019 Echo: Ao root 4.5cm.   Elevated troponin level not due myocardial infarction    ESRD (end stage renal disease) ([place])  Hypertension    Nonobstructive Coronary Artery Disease    a. 09/2019 Cath: LM nl, LAD min irregs, D1 nl, D2 min irregs, LCX large, min irregs, RCA large, 20p, 30p/m.   PE (pulmonary embolism)    Renal insufficiency    Past Surgical History:  Procedure Laterality Date   LEFT HEART CATH AND CORONARY ANGIOGRAPHY N/A 09/30/2019   Procedure: LEFT HEART CATH AND CORONARY ANGIOGRAPHY;  Surgeon: Wellington Hampshire, MD;  Location: [facility];  Service: Cardiovascular;  Laterality: N/A;   TOTAL HIP ARTHROPLASTY     Social History:  reports that he has been  smoking cigarettes. He has been smoking an average of .5 packs per day. He has never used smokeless tobacco. He reports that he does not drink alcohol and does not use drugs.  Allergies  Allergen Reactions   Cyclobenzaprine Nausea Only   Amlodipine    Clonidine Other (See Comments)    Asymptomatic bradycardia to 38   Furosemide Other (See Comments)    Caused renal failure   Tylenol [Acetaminophen] Other (See Comments)    Reaction:  Pt states it bothers his liver   Family History  Problem Relation Age of Onset   Hypertension Mother    Family history: Family history reviewed and pertinent for hypertension in mother.  Prior to Admission medications   Medication Sig Start Date End Date Taking? Authorizing Provider  albuterol (VENTOLIN HFA) 108 (90 Base) MCG/ACT inhaler Inhale 2 puffs into the lungs every 6 (six) hours as needed for wheezing or shortness of breath. 01/16/20   Blake Divine, MD  apixaban (ELIQUIS) 5 MG TABS tablet Take 1 tablet (5 mg total) by mouth 2 (two) times daily. 01/07/21 06/15/21  Wyvonnia Dusky, MD  atorvastatin (LIPITOR) 40 MG tablet Take 1 tablet (40 mg total) by mouth at bedtime. 10/24/19 06/15/21  Max Sane, MD  AURYXIA 1 GM 210 MG(Fe) tablet Take 210 mg by mouth See admin instructions. Take 1 tablet (210mg ) by mouth three times daily with meals and take 1 tablet (210mg ) by mouth with snacks 06/28/19   [provider]  bumetanide (BUMEX) 1 MG tablet Take 1 mg by mouth See admin instructions. Take 1 mg by mouth twice daily on non-dialysis days 10/12/20   [provider]  carvedilol (COREG) 6.25 MG tablet Take 6.25 mg by mouth 2 (two) times daily. 10/12/20   [provider]  [employer] SENNA 8.6 MG tablet Take 2 tablets by mouth daily as needed. 10/12/20   [provider]  dextromethorphan-guaiFENesin (MUCINEX DM) 30-600 MG 12hr tablet Take 1 tablet by mouth 2 (two) times daily as needed for cough. 01/06/20   Lorella Nimrod, MD  diclofenac  Sodium (VOLTAREN) 1 % GEL Apply 2 g topically 4 (four) times daily.    [provider]  hydrALAZINE (APRESOLINE) 100 MG tablet Take 1 tablet (100 mg total) by mouth 3 (three) times daily. Patient taking differently: Take 100 mg by mouth daily. 06/23/19   Jennye Boroughs, MD  isosorbide mononitrate (IMDUR) 60 MG 24 hr tablet Take 1 tablet (60 mg total) by mouth daily. 10/24/19   Max Sane, MD  ondansetron (ZOFRAN) 4 MG tablet Take 1 tablet (4 mg total) by mouth every 6 (six) hours as needed for nausea. 10/24/19   Max Sane, MD  oxyCODONE (OXY IR/ROXICODONE) 5 MG immediate release tablet Take 10 mg by mouth every 4 (four) hours as needed for severe pain.    [provider]  oxyCODONE (OXYCONTIN) 20  mg 12 hr tablet Take 20 mg by mouth every 12 (twelve) hours. Patient not taking: Reported on 06/15/2021    [provider]  pantoprazole (PROTONIX) 40 MG tablet Take 1 tablet (40 mg total) by mouth daily. Patient not taking: Reported on 06/15/2021 10/24/19   Max Sane, MD  polyethylene glycol-electrolytes (NULYTELY) 420 g solution Take 17 g by mouth daily as needed (constipation). Patient not taking: Reported on 06/15/2021 09/15/20   [provider]  tamsulosin (FLOMAX) 0.4 MG CAPS capsule Take 0.4 mg by mouth daily. 10/13/20   [provider]   Physical Exam: Vitals:   08/07/21 1958 08/07/21 2006 08/07/21 2007 08/07/21 2030  BP:  (!) 164/102  (!) 143/85  Pulse:  72  66  Resp:  19  (!) 25  Temp:  98 F (36.7 C)    TempSrc:  Oral    SpO2: (!) 88% (!) 88%  100%  Weight:   132.6 kg   Height:   6' (1.829 m)    Constitutional: appears older than chronological age, frail, NAD, calm, comfortable Eyes: PERRL, lids and conjunctivae normal ENMT: Mucous membranes are moist. Posterior pharynx clear of any exudate or lesions. Age-appropriate dentition. Hearing appropriate Neck: normal, supple, no masses, no thyromegaly Respiratory: clear to auscultation bilaterally,  no wheezing, no crackles. Normal respiratory effort. No accessory muscle use.  Cardiovascular: Regular rate and rhythm, no murmurs / rubs / gallops. No extremity edema. 2+ pedal pulses. No carotid bruits.  Abdomen: Morbidly obese abdomen with panus, no tenderness, no masses palpated, no hepatosplenomegaly. Bowel sounds positive.  Musculoskeletal: no clubbing / cyanosis. No joint deformity upper and lower extremities. Good ROM, no contractures, no atrophy. Normal muscle tone.  Skin: Bilateral lower extremity lesions, with ulceration, and induration          Neurologic: Sensation intact. Strength 5/5 in all 4.  Psychiatric: Normal judgment and insight. Alert and oriented x 3. Normal mood.   EKG: independently reviewed, showing atrial fibrillation with rate of 79, QTc 483  Chest x-ray on Admission: I personally reviewed and I agree with radiologist reading as below.  DG Chest Portable 1 View  Result Date: 08/07/2021 CLINICAL DATA:  Shortness of breath EXAM: PORTABLE CHEST 1 VIEW COMPARISON:  06/17/2021 FINDINGS: Cardiac shadow is enlarged but stable. Increased central vascular congestion is noted with minimal interstitial edema. No focal infiltrate or sizable effusion is noted. No bony abnormality is seen. IMPRESSION: Changes of mild CHF. Electronically Signed   By: Inez Catalina M.D.   On: 08/07/2021 20:27    Labs on Admission: I have personally reviewed following labs  CBC: Recent Labs  Lab 08/07/21 2041  WBC 4.6  NEUTROABS 2.8  HGB 8.9*  HCT 27.0*  MCV 87.9  PLT 81*   Basic Metabolic Panel: Recent Labs  Lab 08/07/21 2041  NA 137  K 5.3*  CL 99  CO2 27  GLUCOSE 90  BUN 51*  CREATININE 6.58*  CALCIUM 9.2   GFR: Estimated Creatinine Clearance: 16.2 mL/min (A) (by C-G formula based on SCr of 6.58 mg/dL (H)).  Urine analysis:    Component Value Date/Time   COLORURINE YELLOW (A) 08/20/2018 0230   APPEARANCEUR CLOUDY (A) 08/20/2018 0230   APPEARANCEUR Clear  11/08/2014 1559   LABSPEC 1.010 08/20/2018 0230   LABSPEC 1.010 11/08/2014 1559   PHURINE 9.0 (H) 08/20/2018 0230   GLUCOSEU NEGATIVE 08/20/2018 0230   GLUCOSEU Negative 11/08/2014 1559   HGBUR NEGATIVE 08/20/2018 0230   BILIRUBINUR NEGATIVE 08/20/2018 0230  BILIRUBINUR Negative 11/08/2014 1559   KETONESUR NEGATIVE 08/20/2018 0230   PROTEINUR 100 (A) 08/20/2018 0230   UROBILINOGEN 1.0 10/28/2010 1100   NITRITE POSITIVE (A) 08/20/2018 0230   LEUKOCYTESUR LARGE (A) 08/20/2018 0230   LEUKOCYTESUR Negative 11/08/2014 1559   CRITICAL CARE Performed by: Briant Cedar Chevez Sambrano  Total critical care time: 35 minutes  Critical care time was exclusive of separately billable procedures and treating other patients.  Critical care was necessary to treat or prevent imminent or life-threatening deterioration. Acute hypoxic respiratory failure, circulatory failure.  Critical care was time spent personally by me on the following activities: development of treatment plan with patient and/or surrogate as well as nursing, discussions with consultants, evaluation of patient's response to treatment, examination of patient, obtaining history from patient or surrogate, ordering and performing treatments and interventions, ordering and review of laboratory studies, ordering and review of radiographic studies, pulse oximetry and re-evaluation of patient's condition.  Dr. Tobie Poet [facility]  If 7PM-7AM, please contact overnight-coverage provider If 7AM-7PM, please contact day coverage provider [email or web address]  08/08/2021, 12:26 AM

## 2021-08-07 NOTE — Assessment & Plan Note (Addendum)
-   Secondary to missed hemodialysis session - Furosemide 60 mg IV one-time dose - BiPAP nightly ordered - Dr. Theador Hawthorne, nephrology has been consulted via epic order, a.m. team to coordinate care - Strict I's and O's

## 2021-08-07 NOTE — Assessment & Plan Note (Signed)
-   Patient states he is compliant with CPAP nightly -Given patient's volume overloaded state, I have ordered BiPAP nightly and patient is amendable

## 2021-08-07 NOTE — Assessment & Plan Note (Addendum)
-   Continue oxygen supplementation with 2 L nasal cannula to maintain SPO2 greater than 92% - BiPAP nightly ordered, discussed with patient who is amendable - Presumed secondary to volume overload in setting of hemodialysis noncompliance

## 2021-08-07 NOTE — ED Triage Notes (Signed)
Pt arrives from home via [facility]. Reports CP, fluid retention.  M/W/F dialysis pt.  Missed Wed this past week. States dialysis to clean blood only.

## 2021-08-07 NOTE — Assessment & Plan Note (Addendum)
-   Hemoglobin on presentation is 8.9, and is within baseline range - CBC in the a.m.

## 2021-08-07 NOTE — Assessment & Plan Note (Signed)
-   Chronic thrombocytopenia, presumed secondary to chronic liver disease

## 2021-08-07 NOTE — ED Notes (Signed)
Called lab to request CBC/BMP/Trop pull.

## 2021-08-07 NOTE — ED Provider Notes (Signed)
[facility] Provider Note    Event Date/Time   First MD Initiated Contact with Patient 08/07/21 2000     (approximate)   History   Chief Complaint Chest Pain   HPI  [name] is a 66 y.o. male with past medical history of hypertension, hyperlipidemia, atrial fibrillation on Eliquis, ESRD on HD (MWF), and polysubstance abuse who presents to the ED complaining of chest pain and shortness of breath.  Patient reports that he has been dealing with increasing sharp pain in the center of his chest over the past 24 hours along with difficulty breathing.  He states that he missed his dialysis appointment on Wednesday, but went to his appointment as scheduled yesterday.  He is concerned that they are not taking off any fluid when they run him for dialysis, states it feels like his chest is full of fluid.  He states his legs have been more swollen than usual and are draining fluid.  EMS noted irregular rhythm prior to arrival, there was also concern for widened QRS complex and so patient was given dose of IV calcium along with bicarb.  EMS attempted BiPAP due to patient's difficulty breathing, however he was unable to tolerate this.     Physical Exam   Triage Vital Signs: ED Triage Vitals [08/07/21 1958]  Enc Vitals Group     BP      Pulse      Resp      Temp      Temp src      SpO2 (!) 88 %     Weight      Height      Head Circumference      Peak Flow      Pain Score      Pain Loc      Pain Edu?      Excl. in [place]?     Most recent vital signs: Vitals:   08/07/21 2006 08/07/21 2030  BP: (!) 164/102 (!) 143/85  Pulse: 72 66  Resp: 19 (!) 25  Temp: 98 F (36.7 C)   SpO2: (!) 88% 100%    Constitutional: Alert and oriented. Eyes: Conjunctivae are normal. Head: Atraumatic. Nose: No congestion/rhinnorhea. Mouth/Throat: Mucous membranes are moist.  Cardiovascular: Normal rate, irregularly irregular rhythm. Grossly normal heart sounds.  2+ radial  pulses bilaterally.  Left upper extremity AV fistula with palpable thrill. Respiratory: Tachypneic with mildly increased respiratory effort.  No retractions. Lungs with rales bilaterally, no wheezing noted. Gastrointestinal: Soft and nontender. No distention. Musculoskeletal: No lower extremity tenderness, 2+ pitting edema to knees bilaterally. Neurologic:  Normal speech and language. No gross focal neurologic deficits are appreciated.    ED Results / Procedures / Treatments   Labs (all labs ordered are listed, but only abnormal results are displayed) Labs Reviewed  RESP PANEL BY RT-PCR (FLU A&B, COVID) ARPGX2 - Abnormal; Notable for the following components:      Result Value   SARS Coronavirus 2 by RT PCR POSITIVE (*)    All other components within normal limits  CBC WITH DIFFERENTIAL/PLATELET - Abnormal; Notable for the following components:   RBC 3.07 (*)    Hemoglobin 8.9 (*)    HCT 27.0 (*)    RDW 15.6 (*)    Platelets 81 (*)    All other components within normal limits  BASIC METABOLIC PANEL - Abnormal; Notable for the following components:   Potassium 5.3 (*)    BUN 51 (*)  Creatinine, Ser 6.58 (*)    GFR, Estimated 9 (*)    All other components within normal limits  TROPONIN I (HIGH SENSITIVITY) - Abnormal; Notable for the following components:   Troponin I (High Sensitivity) 75 (*)    All other components within normal limits  BASIC METABOLIC PANEL  CBC  PHOSPHORUS  MAGNESIUM  TROPONIN I (HIGH SENSITIVITY)     EKG  ED ECG REPORT I, Blake Divine, the attending physician, personally viewed and interpreted this ECG.   Date: 08/07/2021  EKG Time: 20:02  Rate: 79  Rhythm: atrial fibrillation  Axis: Normal  Intervals: Borderline IVCD  ST&T Change: Lateral T wave inversions, similar to previous  RADIOLOGY Chest x-ray reviewed by me with no focal infiltrate but does appear consistent with pulmonary edema.  PROCEDURES:  Critical Care performed:  No  .1-3 Lead EKG Interpretation Performed by: Blake Divine, MD Authorized by: Blake Divine, MD     Interpretation: abnormal     ECG rate:  75-90   ECG rate assessment: normal     Rhythm: atrial fibrillation     Ectopy: none     Conduction: normal     MEDICATIONS ORDERED IN ED: Medications  sodium zirconium cyclosilicate (LOKELMA) packet 10 g (has no administration in time range)  oxyCODONE (Oxy IR/ROXICODONE) immediate release tablet 10 mg (has no administration in time range)  oxyCODONE (Oxy IR/ROXICODONE) immediate release tablet 10 mg (has no administration in time range)  atorvastatin (LIPITOR) tablet 40 mg (has no administration in time range)  carvedilol (COREG) tablet 6.25 mg (has no administration in time range)  polyethylene glycol-electrolytes (NuLYTELY) solution 161.9 mL (has no administration in time range)  apixaban (ELIQUIS) tablet 5 mg (has no administration in time range)  albuterol (VENTOLIN HFA) 108 (90 Base) MCG/ACT inhaler 2 puff (has no administration in time range)  acetaminophen (TYLENOL) tablet 650 mg (has no administration in time range)    Or  acetaminophen (TYLENOL) suppository 650 mg (has no administration in time range)  ondansetron (ZOFRAN) tablet 4 mg (has no administration in time range)    Or  ondansetron (ZOFRAN) injection 4 mg (has no administration in time range)  morphine 4 MG/ML injection 4 mg (4 mg Intravenous Given 08/07/21 2008)     IMPRESSION / MDM / [place] / ED COURSE  I reviewed the triage vital signs and the nursing notes.                              66 y.o. male with past medical history of hypertension, hyperlipidemia, atrial fibrillation on Eliquis, ESRD on HD (MWF), and polysubstance abuse who presents to the ED complaining of increasing chest pain and shortness of breath over the course of the day today after missing dialysis earlier in the week.  Differential diagnosis includes, but is not limited to, ACS,  PE, pneumonia, pneumothorax, hypervolemia, noncompliance with dialysis schedule.  Patient nontoxic-appearing and in no acute distress, EKG shows atrial fibrillation with controlled rate, no ischemic changes noted and low suspicion for significant hyperkalemia based off of EKG.  He is not in any respiratory distress, maintaining O2 sats on room air at rest, but if he begins talking frequently he quickly becomes short of breath and drops O2 sats to 88% on room air.  We will further assess with chest x-ray and labs, anticipate admission for dialysis.  The patient is on the cardiac monitor to evaluate for evidence of  arrhythmia and/or significant heart rate changes.  Chest x-ray is concerning for pulmonary edema, patient maintains O2 sats on room air at rest but if he speaks for long time or exerts himself at all O2 sats will drop to 88%.  BMP is remarkable for mild hyperkalemia at 5.3 and we will give a dose of Lokelma.  CBC shows anemia stable compared to previous and troponin is elevated similar to patient's prior baseline.  Low suspicion for ACS given his atypical symptoms and I doubt PE.  Patient takes oxycodone chronically for pain and we will give his usual dose.  Case discussed with hospitalist for admission.  Case also discussed with Dr. Theador Hawthorne of nephrology, who agrees that patient may hold off on dialysis until tomorrow morning.       FINAL CLINICAL IMPRESSION(S) / ED DIAGNOSES   Final diagnoses:  Shortness of breath  Nonspecific chest pain  Hyperkalemia  Hypervolemia, unspecified hypervolemia type     Rx / DC Orders   ED Discharge Orders     None        Note:  This document was prepared using Dragon voice recognition software and may include unintentional dictation errors.   Blake Divine, MD 08/07/21 2149

## 2021-08-08 DIAGNOSIS — G894 Chronic pain syndrome: Secondary | ICD-10-CM | POA: Diagnosis present

## 2021-08-08 DIAGNOSIS — K219 Gastro-esophageal reflux disease without esophagitis: Secondary | ICD-10-CM | POA: Diagnosis present

## 2021-08-08 DIAGNOSIS — J9601 Acute respiratory failure with hypoxia: Secondary | ICD-10-CM | POA: Diagnosis present

## 2021-08-08 DIAGNOSIS — U071 COVID-19: Secondary | ICD-10-CM | POA: Diagnosis present

## 2021-08-08 DIAGNOSIS — R778 Other specified abnormalities of plasma proteins: Secondary | ICD-10-CM | POA: Diagnosis present

## 2021-08-08 DIAGNOSIS — I5023 Acute on chronic systolic (congestive) heart failure: Secondary | ICD-10-CM | POA: Diagnosis present

## 2021-08-08 DIAGNOSIS — E875 Hyperkalemia: Secondary | ICD-10-CM | POA: Diagnosis present

## 2021-08-08 DIAGNOSIS — R34 Anuria and oliguria: Secondary | ICD-10-CM | POA: Diagnosis present

## 2021-08-08 DIAGNOSIS — N2581 Secondary hyperparathyroidism of renal origin: Secondary | ICD-10-CM | POA: Diagnosis present

## 2021-08-08 DIAGNOSIS — I132 Hypertensive heart and chronic kidney disease with heart failure and with stage 5 chronic kidney disease, or end stage renal disease: Secondary | ICD-10-CM | POA: Diagnosis present

## 2021-08-08 DIAGNOSIS — K7460 Unspecified cirrhosis of liver: Secondary | ICD-10-CM | POA: Diagnosis present

## 2021-08-08 DIAGNOSIS — G4733 Obstructive sleep apnea (adult) (pediatric): Secondary | ICD-10-CM | POA: Diagnosis present

## 2021-08-08 DIAGNOSIS — E785 Hyperlipidemia, unspecified: Secondary | ICD-10-CM | POA: Diagnosis present

## 2021-08-08 DIAGNOSIS — N186 End stage renal disease: Secondary | ICD-10-CM | POA: Diagnosis present

## 2021-08-08 DIAGNOSIS — L97929 Non-pressure chronic ulcer of unspecified part of left lower leg with unspecified severity: Secondary | ICD-10-CM | POA: Diagnosis present

## 2021-08-08 DIAGNOSIS — E669 Obesity, unspecified: Secondary | ICD-10-CM | POA: Diagnosis present

## 2021-08-08 DIAGNOSIS — I4819 Other persistent atrial fibrillation: Secondary | ICD-10-CM | POA: Diagnosis present

## 2021-08-08 DIAGNOSIS — D631 Anemia in chronic kidney disease: Secondary | ICD-10-CM | POA: Diagnosis present

## 2021-08-08 DIAGNOSIS — Z992 Dependence on renal dialysis: Secondary | ICD-10-CM | POA: Diagnosis not present

## 2021-08-08 DIAGNOSIS — R079 Chest pain, unspecified: Secondary | ICD-10-CM | POA: Diagnosis present

## 2021-08-08 DIAGNOSIS — L97919 Non-pressure chronic ulcer of unspecified part of right lower leg with unspecified severity: Secondary | ICD-10-CM | POA: Diagnosis present

## 2021-08-08 DIAGNOSIS — D696 Thrombocytopenia, unspecified: Secondary | ICD-10-CM | POA: Diagnosis present

## 2021-08-08 DIAGNOSIS — E8770 Fluid overload, unspecified: Secondary | ICD-10-CM | POA: Diagnosis present

## 2021-08-08 DIAGNOSIS — N400 Enlarged prostate without lower urinary tract symptoms: Secondary | ICD-10-CM | POA: Diagnosis present

## 2021-08-08 DIAGNOSIS — R0602 Shortness of breath: Secondary | ICD-10-CM | POA: Diagnosis present

## 2021-08-08 LAB — CBC
HCT: 27.8 % — ABNORMAL LOW (ref 39.0–52.0)
Hemoglobin: 9 g/dL — ABNORMAL LOW (ref 13.0–17.0)
MCH: 28.5 pg (ref 26.0–34.0)
MCHC: 32.4 g/dL (ref 30.0–36.0)
MCV: 88 fL (ref 80.0–100.0)
Platelets: 90 10*3/uL — ABNORMAL LOW (ref 150–400)
RBC: 3.16 MIL/uL — ABNORMAL LOW (ref 4.22–5.81)
RDW: 15.4 % (ref 11.5–15.5)
WBC: 5.3 10*3/uL (ref 4.0–10.5)
nRBC: 0 % (ref 0.0–0.2)

## 2021-08-08 LAB — BASIC METABOLIC PANEL
Anion gap: 13 (ref 5–15)
BUN: 55 mg/dL — ABNORMAL HIGH (ref 8–23)
CO2: 26 mmol/L (ref 22–32)
Calcium: 8.9 mg/dL (ref 8.9–10.3)
Chloride: 98 mmol/L (ref 98–111)
Creatinine, Ser: 7.21 mg/dL — ABNORMAL HIGH (ref 0.61–1.24)
GFR, Estimated: 8 mL/min — ABNORMAL LOW (ref >60)
Glucose, Bld: 89 mg/dL (ref 70–99)
Potassium: 5.3 mmol/L — ABNORMAL HIGH (ref 3.5–5.1)
Sodium: 137 mmol/L (ref 135–145)

## 2021-08-08 LAB — TROPONIN I (HIGH SENSITIVITY): Troponin I (High Sensitivity): 76 ng/L — ABNORMAL HIGH (ref <18)

## 2021-08-08 LAB — PHOSPHORUS: Phosphorus: 5.8 mg/dL — ABNORMAL HIGH (ref 2.5–4.6)

## 2021-08-08 LAB — MAGNESIUM: Magnesium: 2.2 mg/dL (ref 1.7–2.4)

## 2021-08-08 MED ORDER — ALTEPLASE 2 MG IJ SOLR
2.0000 mg | Freq: Once | INTRAMUSCULAR | Status: DC | PRN
  Filled 2021-08-08: qty 2

## 2021-08-08 MED ORDER — HEPARIN SODIUM (PORCINE) 1000 UNIT/ML DIALYSIS
20.0000 [IU]/kg | INTRAMUSCULAR | Status: DC | PRN
  Filled 2021-08-08: qty 3

## 2021-08-08 MED ORDER — CHLORHEXIDINE GLUCONATE CLOTH 2 % EX PADS
6.0000 | MEDICATED_PAD | Freq: Every day | CUTANEOUS | Status: DC
  Administered 2021-08-09 – 2021-08-10 (×2): 6 via TOPICAL
  Filled 2021-08-08 (×2): qty 6

## 2021-08-08 MED ORDER — DIPHENHYDRAMINE HCL 25 MG PO CAPS
25.0000 mg | ORAL_CAPSULE | Freq: Once | ORAL | Status: AC
  Administered 2021-08-08: 25 mg via ORAL
  Filled 2021-08-08: qty 1

## 2021-08-08 MED ORDER — SODIUM CHLORIDE 0.9 % IV SOLN: 100.0000 mL | INTRAVENOUS | Status: DC | PRN

## 2021-08-08 MED ORDER — OXYCODONE HCL 5 MG PO TABS: 10.0000 mg | ORAL_TABLET | ORAL | Status: DC | PRN

## 2021-08-08 MED ORDER — DIPHENHYDRAMINE HCL 50 MG/ML IJ SOLN
INTRAMUSCULAR | Status: AC
  Administered 2021-08-08: 50 mg via INTRAVENOUS
  Filled 2021-08-08: qty 1

## 2021-08-08 MED ORDER — OXYCODONE HCL ER 10 MG PO T12A
20.0000 mg | EXTENDED_RELEASE_TABLET | Freq: Two times a day (BID) | ORAL | Status: DC
  Administered 2021-08-08 – 2021-08-10 (×4): 20 mg via ORAL
  Filled 2021-08-08 (×4): qty 2

## 2021-08-08 MED ORDER — TAMSULOSIN HCL 0.4 MG PO CAPS
0.4000 mg | ORAL_CAPSULE | Freq: Every day | ORAL | Status: DC
  Administered 2021-08-08 – 2021-08-10 (×2): 0.4 mg via ORAL
  Filled 2021-08-08 (×3): qty 1

## 2021-08-08 MED ORDER — PENTAFLUOROPROP-TETRAFLUOROETH EX AERO
1.0000 "application " | INHALATION_SPRAY | CUTANEOUS | Status: DC | PRN
  Filled 2021-08-08: qty 30

## 2021-08-08 MED ORDER — HEPARIN SODIUM (PORCINE) 1000 UNIT/ML DIALYSIS
1000.0000 [IU] | INTRAMUSCULAR | Status: DC | PRN
  Filled 2021-08-08: qty 1

## 2021-08-08 MED ORDER — OXYCODONE HCL 5 MG PO TABS
10.0000 mg | ORAL_TABLET | ORAL | Status: DC | PRN
  Administered 2021-08-08 – 2021-08-10 (×8): 10 mg via ORAL
  Filled 2021-08-08 (×7): qty 2

## 2021-08-08 MED ORDER — LIDOCAINE HCL (PF) 1 % IJ SOLN
5.0000 mL | INTRAMUSCULAR | Status: DC | PRN
  Filled 2021-08-08: qty 5

## 2021-08-08 MED ORDER — SILVER SULFADIAZINE 1 % EX CREA
TOPICAL_CREAM | Freq: Two times a day (BID) | CUTANEOUS | Status: DC
  Filled 2021-08-08 (×3): qty 85

## 2021-08-08 MED ORDER — LIDOCAINE-PRILOCAINE 2.5-2.5 % EX CREA: 1.0000 "application " | TOPICAL_CREAM | CUTANEOUS | Status: DC | PRN

## 2021-08-08 NOTE — Progress Notes (Signed)
°   08/08/21 0025  Clinical Encounter Type  Visited With Patient  Visit Type Initial;Spiritual support;Social support  Referral From Nurse  Consult/Referral To Chaplain  Spiritual Encounters  Spiritual Needs Prayer   Daryel November received recommendation to check on Pt who was experiencing much pain and discomfort due to leg ulceration. Chaplain Burris assured Pt that he was cared for and that his condition matters. Chaplain B offered prayer and that was quite meaningful to the Pt so we both prayed together.

## 2021-08-08 NOTE — Progress Notes (Addendum)
[place] at [place] NAME: [name]    MR#:  [id number]  DATE OF BIRTH:  May 22, 1956  SUBJECTIVE:  patient came in with increasing shortness of breath. Was found to be hypoxic. Missed dialysis past week one time. Had dialysis on Friday without much fluid removal per patient. Remain short of breath although sats are stable on room air. Patient was heading down to dialysis during evaluation. Denies any chest pain no family in the  REVIEW OF SYSTEMS:   Review of Systems  Constitutional:  Negative for chills, fever and weight loss.  HENT:  Negative for ear discharge, ear pain and nosebleeds.   Eyes:  Negative for blurred vision, pain and discharge.  Respiratory:  Positive for shortness of breath. Negative for sputum production, wheezing and stridor.   Cardiovascular:  Negative for chest pain, palpitations, orthopnea and PND.  Gastrointestinal:  Negative for abdominal pain, diarrhea, nausea and vomiting.  Genitourinary:  Negative for frequency and urgency.  Musculoskeletal:  Negative for back pain and joint pain.  Neurological:  Positive for weakness. Negative for sensory change, speech change and focal weakness.  Psychiatric/Behavioral:  Negative for depression and hallucinations. The patient is not nervous/anxious.   Tolerating Diet:yes Tolerating PT:   DRUG ALLERGIES:   Allergies  Allergen Reactions   Cyclobenzaprine Nausea Only   Amlodipine    Clonidine Other (See Comments)    Asymptomatic bradycardia to 38   Furosemide Other (See Comments)    Caused renal failure   Tylenol [Acetaminophen] Other (See Comments)    Reaction:  Pt states it bothers his liver    VITALS:  Blood pressure 139/80, pulse 81, temperature (!) 97.5 F (36.4 C), temperature source Oral, resp. rate (!) 22, height 6' (1.829 m), weight 132.6 kg, SpO2 100 %.  PHYSICAL EXAMINATION:   Physical Exam  GENERAL:  66 y.o.-year-old patient lying in the bed  with no acute distress.  LUNGS decreased breath sounds bilaterally, no wheezing, rales, rhonchi.  CARDIOVASCULAR: S1, S2 normal. No murmurs, rubs, or gallops.  ABDOMEN: Soft, nontender, distended. Bowel sounds present.  EXTREMITIES: chronic leg wounds NEUROLOGIC: nonfocal PSYCHIATRIC:  patient is alert and awake SKIN: per RN  LABORATORY PANEL:  CBC Recent Labs  Lab 08/08/21 0713  WBC 5.3  HGB 9.0*  HCT 27.8*  PLT 90*    Chemistries  Recent Labs  Lab 08/08/21 0713  NA 137  K 5.3*  CL 98  CO2 26  GLUCOSE 89  BUN 55*  CREATININE 7.21*  CALCIUM 8.9  MG 2.2   Cardiac Enzymes No results for input(s): TROPONINI in the last 168 hours. RADIOLOGY:  DG Chest Portable 1 View  Result Date: 08/07/2021 CLINICAL DATA:  Shortness of breath EXAM: PORTABLE CHEST 1 VIEW COMPARISON:  06/17/2021 FINDINGS: Cardiac shadow is enlarged but stable. Increased central vascular congestion is noted with minimal interstitial edema. No focal infiltrate or sizable effusion is noted. No bony abnormality is seen. IMPRESSION: Changes of mild CHF. Electronically Signed   By: Inez Catalina M.D.   On: 08/07/2021 20:27   ASSESSMENT AND PLAN:  [name] is a 66 year old male with end-stage renal disease on hemodialysis MWF, hypertension, hyperlipidemia, persistent atrial fibrillation on anticoagulation with Eliquis, GERD, BPH, chronic pain syndrome, noncompliance with hemodialysis, who presents emergency department via EMS for chief concerns of shortness of breath.  Per EDP, patient states that he missed hemodialysis on Wednesday, 08/04/2021.  He states that he went to hemodialysis appropriately on 08/06/2021  however he he states that he did not feel they pulled out enough fluids on Friday. Sats were 88% on room air. EMS tried to use BiPAP however patient was unable to tolerated  Acute hypoxic respiratory failure acute pulmonary edema/volume overload end-stage renal disease on hemodialysis acute on chronic  systolic congestive heart failure. -- continue supplementation with oxygen 2 L to keep sats greater than 92% reinstalling -- patient intolerant to BiPAP. -- Nephrology has place orders for hemodialysis with possible ultrafiltration -- echo in June 2022 Ejection fraction 40-45 percent, the left ventricle demonstrates global hypokinesis.  Mild left ventricular hypertrophy.  persistent a fib -- resume Coreg and eliquis  ESRB on hemodialysis -- as above  Hyperkalemia -- potassium 5.3. Patient received dose of lokelma -- dialysis should help  bilateral lower extremity lymphedema with chronic lower extremity wound -- appreciate wound RN dressing instructions  anemia of chronic disease -- hemoglobin 8.9 which is baseline  COVID-19 infection --sats 99% on RA --CXR no PNA    Procedures: Family communication : none Consults : nephrology CODE STATUS: full DVT Prophylaxis : eliquis Level of care: Telemetry Medical Status is: Inpatient  Remains inpatient appropriate because: volume overload and hemodialysis for        TOTAL TIME TAKING CARE OF THIS PATIENT: 25 minutes.  >50% time spent on counselling and coordination of care  Note: This dictation was prepared with Dragon dictation along with smaller phrase technology. Any transcriptional errors that result from this process are unintentional.  [name] M.D    [facility]   CC: Primary care physician; Center, [employer] Patient ID: [name], male   DOB: January 31, 1956, 66 y.o.   MRN: [id number]

## 2021-08-08 NOTE — ED Notes (Signed)
Dialysis RN called for report and placed transport request. Consent requested and obtained.

## 2021-08-08 NOTE — Progress Notes (Signed)
Patient seen for urgent hemodialysis treatment, LUA AVF cannulates without difficulty, maintains prescribed blood flow rate, minimal bleeding post treatment. Targeted UF unachievable, as patient terminated treatment early due to pain. Patient was offered but declined Tylenol for pain in BLE, stating he desired to have Fentanyl order reinstated that worked well for his pain level. Having not received his pain medication of choice patient stated he could not bear to continue with his hemodialysis treatment. On call nephrologist notified and treatment terminated. Patient unable to stand for his weight due to bilateral ulcers on lower extremities, and on stretcher from ED. Patient transferred to assigned room 207, report given to the nurse.

## 2021-08-08 NOTE — Assessment & Plan Note (Signed)
-   Lokelma, one-time dose in the emergency department - BMP in the a.m.

## 2021-08-08 NOTE — Consult Note (Signed)
WOC Nurse Consult Note: Patient receiving care in [facility] ED3. Consult completed remotely. Patient is Covid +. Reason for Consult: BLE wounds, followed at [facility] wound clinic for wounds and lymphedema. Wound type: Scattered wounds on BLE, see photos Pressure Injury POA: Yes/No/NA Measurement: Wound bed: see photos Drainage (amount, consistency, odor) To be provided by the bedside RN in the flowsheet section  Periwound: Dressing procedure/placement/frequency: [place] BLE with soap and water, pat dry. Apply Silvadene over all open wounds. Cover with  ABD pads. Wrap legs with kerlix and 3 or 4 inch Ace Bandages. To be performed twice daily.  Monitor the wound area(s) for worsening of condition such as: Signs/symptoms of infection,  Increase in size,  Development of or worsening of odor, Development of pain, or increased pain at the affected locations.  Notify the medical team if any of these develop.  Thank you for the consult. [place] nurse will not follow at this time.  Please re-consult the [facility] team if needed.  Val Riles, RN, MSN, CWOCN, CNS-BC, pager [phone]

## 2021-08-08 NOTE — Progress Notes (Signed)
[name]  MRN: [id number]  DOB/AGE: [birth year] 66 y.o.  Primary Care Physician:[facility], [place] date: 08/07/2021  Chief Complaint:  Chief Complaint  Patient presents with   Chest Pain    S-Pt presented on  08/07/2021 with  Chief Complaint  Patient presents with   Chest Pain  . [name] is a 66 year old male with end-stage renal disease on hemodialysis MWF, hypertension, hyperlipidemia, persistent atrial fibrillation on anticoagulation with Eliquis, GERD, BPH, chronic pain syndrome, noncompliance with hemodialysis, who presents emergency department via EMS for chief concerns of shortness of breath.   Per EDP, patient states that he missed hemodialysis on Wednesday, 08/04/2021.  He states that he went to hemodialysis appropriately on 08/06/2021 however he he states that he did not feel they pulled out enough fluids on Friday.     Medications  apixaban  5 mg Oral BID   atorvastatin  40 mg Oral QHS   carvedilol  6.25 mg Oral BID         HBZ:JIRCV from the symptoms mentioned above,there are no other symptoms referable to all systems reviewed.  Physical Exam: Vital signs in last 24 hours: Temp:  [98 F (36.7 C)] 98 F (36.7 C) (01/28 2006) Pulse Rate:  [66-77] 76 (01/29 0700) Resp:  [17-25] 17 (01/29 0700) BP: (122-164)/(85-102) 149/90 (01/29 0700) SpO2:  [88 %-100 %] 98 % (01/29 0700) Weight:  [132.6 kg] 132.6 kg (01/28 2007) Weight change:     Intake/Output from previous day: 01/28 0701 - 01/29 0700 In: 724 [P.O.:724] Out: -  No intake/output data recorded.   Physical Exam:  General- pt is awake,alert, oriented to time place and person  Resp- No acute REsp distress, CTA B/L NO Rhonchi  CVS- S1S2 regular in rate and rhythm  GIT- BS+, soft, Non tender , Non distended,Obese  EXT- 2+ LE Edema,  No Cyanosis Chronic venous stasis changes, superficial ulcerations present  Access- LUE  AVF  Lab Results:  CBC  Recent  Labs    08/07/21 2041 08/08/21 0713  WBC 4.6 5.3  HGB 8.9* 9.0*  HCT 27.0* 27.8*  PLT 81* 90*    BMET  Recent Labs    08/07/21 2041 08/08/21 0713  NA 137 137  K 5.3* 5.3*  CL 99 98  CO2 27 26  GLUCOSE 90 89  BUN 51* 55*  CREATININE 6.58* 7.21*  CALCIUM 9.2 8.9      Most recent Creatinine trend  Lab Results  Component Value Date   CREATININE 7.21 (H) 08/08/2021   CREATININE 6.58 (H) 08/07/2021   CREATININE 7.48 (H) 06/17/2021      MICRO   Recent Results (from the past 240 hour(s))  Resp Panel by RT-PCR (Flu A&B, Covid) Nasopharyngeal Swab     Status: Abnormal   Collection Time: 08/07/21  8:21 PM   Specimen: Nasopharyngeal Swab; Nasopharyngeal(NP) swabs in vial transport medium  Result Value Ref Range Status   SARS Coronavirus 2 by RT PCR POSITIVE (A) NEGATIVE Final    Comment: (NOTE) SARS-CoV-2 target nucleic acids are DETECTED.  The SARS-CoV-2 RNA is generally detectable in upper respiratory specimens during the acute phase of infection. Positive results are indicative of the presence of the identified virus, but do not rule out bacterial infection or co-infection with other pathogens not detected by the test. Clinical correlation with patient history and other diagnostic information is necessary to determine patient infection status. The expected result is Negative.  Fact Sheet for Patients: EntrepreneurPulse.com.au  Fact Sheet for Healthcare Providers: IncredibleEmployment.be  This test is not yet approved or cleared by the [place] FDA and  has been authorized for detection and/or diagnosis of SARS-CoV-2 by FDA under an Emergency Use Authorization (EUA).  This EUA will remain in effect (meaning this test can be used) for the duration of  the COVID-19 declaration under Section 564(b)(1) of the A ct, 21 U.S.C. section 360bbb-3(b)(1), unless the authorization is terminated or revoked sooner.     Influenza  A by PCR NEGATIVE NEGATIVE Final   Influenza B by PCR NEGATIVE NEGATIVE Final    Comment: (NOTE) The Xpert Xpress SARS-CoV-2/FLU/RSV plus assay is intended as an aid in the diagnosis of influenza from Nasopharyngeal swab specimens and should not be used as a sole basis for treatment. Nasal washings and aspirates are unacceptable for Xpert Xpress SARS-CoV-2/FLU/RSV testing.  Fact Sheet for Patients: EntrepreneurPulse.com.au  Fact Sheet for Healthcare Providers: IncredibleEmployment.be  This test is not yet approved or cleared by the [place] FDA and has been authorized for detection and/or diagnosis of SARS-CoV-2 by FDA under an Emergency Use Authorization (EUA). This EUA will remain in effect (meaning this test can be used) for the duration of the COVID-19 declaration under Section 564(b)(1) of the Act, 21 U.S.C. section 360bbb-3(b)(1), unless the authorization is terminated or revoked.  Performed at [facility], [address] [place], [place] [address]      Chest x-ray done on the 28th 2023 FINDINGS: Cardiac shadow is enlarged but stable. Increased central vascular congestion is noted with minimal interstitial edema. No focal infiltrate or sizable effusion is noted. No bony abnormality is seen.    Latest Reference Range & Units 08/07/21 20:21  SARS Coronavirus 2 by RT PCR NEGATIVE  POSITIVE !  !: Data is abnormal  Impression:   [name] is a 66 y.o. Black male with end stage renal disease, hypertension, atrial fibrillation, coronary artery disease, pulmonary embolism, hyperlipidemia, polysubstance abuse who is admitted to [facility] on August 08, 2021  Acute respiratory failure with hypoxia Persistently fibrillation Thrombocytopenia Hyperkalemia COVID-19    [facility] Nephrology TTS [facility] Left AVF 118kg   1)End-stage renal disease. Patient has hyperkalemia Patient chest x-ray also shows  CHF Patient will need renal replacement therapy today   2)HTN Blood pressure stable Fluid removal/dialysis should help   3)Anemia of chronic disease  CBC Latest Ref Rng & Units 08/08/2021 08/07/2021 06/17/2021  WBC 4.0 - 10.5 K/uL 5.3 4.6 5.4  Hemoglobin 13.0 - 17.0 g/dL 9.0(L) 8.9(L) 8.6(L)  Hematocrit 39.0 - 52.0 % 27.8(L) 27.0(L) 26.6(L)  Platelets 150 - 400 K/uL 90(L) 81(L) 92(L)       HGb at goal (9--11) We will keep patient on Epogen  4) Secondary hyperparathyroidism -CKD Mineral-Bone Disorder    Lab Results  Component Value Date   PTH 758 (H) 08/15/2018   CALCIUM 8.9 08/08/2021   PHOS 7.3 (H) 06/17/2021    Secondary Hyperparathyroidism Presant  Phosphorus is not at goal. We will continue patient's binders  5)COVID-19 Primary team is following   6) Electrolytes   BMP Latest Ref Rng & Units 08/08/2021 08/07/2021 06/17/2021  Glucose 70 - 99 mg/dL 89 90 96  BUN 8 - 23 mg/dL 55(H) 51(H) 51(H)  Creatinine 0.61 - 1.24 mg/dL 7.21(H) 6.58(H) 7.48(H)  Sodium 135 - 145 mmol/L 137 137 139  Potassium 3.5 - 5.1 mmol/L 5.3(H) 5.3(H) 4.6  Chloride 98 - 111 mmol/L 98 99 97(L)  CO2 22 -  32 mmol/L 26 27 28   Calcium 8.9 - 10.3 mg/dL 8.9 9.2 8.0(L)     Sodium Normonatremic   Potassium Hyperkalemia Patient will benefit from renal replacement therapy    7)Acid base    Co2 at goal  8) Fluid overload Patient chest x-ray does show patient has increased vascular congestion   Plan:   Patient with hyperkalemia and fluid overload Patient will need dialysis today Patient will most likely need renal placement therapy tomorrow as well    Jsiah Menta s [facility] 08/08/2021, 8:33 AM

## 2021-08-08 NOTE — ED Notes (Signed)
RN to bedside. Pt alert and sitting on edge of bed. Pt unable to lift his own legs onto bed. This RN put patient in recliner and he was more comfortable.

## 2021-08-08 NOTE — ED Notes (Signed)
This RN entered room to answer call light and pt did not have his bipap on.  Pt stated "I can't sleep with this thing on."  Pt educated on importance of bipap. Pt stated that he might put it back on later.  IPMD made aware.

## 2021-08-09 DIAGNOSIS — J9601 Acute respiratory failure with hypoxia: Secondary | ICD-10-CM | POA: Diagnosis not present

## 2021-08-09 NOTE — Progress Notes (Signed)
Patient to have dialysis later today given COVID-positive status.

## 2021-08-09 NOTE — Progress Notes (Signed)
Late entry: Approximately 0410, communicated with patient concerning cpap/bipap use. Dreamstation machine noted to be at bedside. Machine was removed because patient is covid+, so a V60 has to be used. Patient stated it was too late to go on machine at this time. I apologized to patient because I was not aware of order and told him, I would place him on a V60 tonight.

## 2021-08-09 NOTE — Progress Notes (Signed)
[place] at [place] NAME: [name]    MR#:  [id number]  DATE OF BIRTH:  11-30-1955  SUBJECTIVE:  patient came in with increasing shortness of breath. Was found to be hypoxic. Missed dialysis past week one time. Had dialysis on Friday without much fluid removal per patient. Remain short of breath although sats are stable on room air.   Denies any chest pain no family in the room. Patient overall feeling little better. Had dialysis with ultrafiltration yesterday. Sitting in the chair. Patient tells me he is going to go on a 21 day fasting to help with his weight  REVIEW OF SYSTEMS:   Review of Systems  Constitutional:  Negative for chills, fever and weight loss.  HENT:  Negative for ear discharge, ear pain and nosebleeds.   Eyes:  Negative for blurred vision, pain and discharge.  Respiratory:  Positive for shortness of breath. Negative for sputum production, wheezing and stridor.   Cardiovascular:  Negative for chest pain, palpitations, orthopnea and PND.  Gastrointestinal:  Negative for abdominal pain, diarrhea, nausea and vomiting.  Genitourinary:  Negative for frequency and urgency.  Musculoskeletal:  Negative for back pain and joint pain.  Neurological:  Positive for weakness. Negative for sensory change, speech change and focal weakness.  Psychiatric/Behavioral:  Negative for depression and hallucinations. The patient is not nervous/anxious.   Tolerating Diet:yes Tolerating PT: ambulatory  DRUG ALLERGIES:   Allergies  Allergen Reactions   Cyclobenzaprine Nausea Only   Amlodipine    Clonidine Other (See Comments)    Asymptomatic bradycardia to 38   Furosemide Other (See Comments)    Caused renal failure   Tylenol [Acetaminophen] Other (See Comments)    Reaction:  Pt states it bothers his liver    VITALS:  Blood pressure (!) 141/89, pulse 70, temperature 98.1 F (36.7 C), temperature source Oral, resp. rate 18, height  6' (1.829 m), weight 132.6 kg, SpO2 97 %.  PHYSICAL EXAMINATION:   Physical Exam  GENERAL:  66 y.o.-year-old patient lying in the bed with no acute distress.  LUNGS decreased breath sounds bilaterally, no wheezing, rales, rhonchi.  CARDIOVASCULAR: S1, S2 normal. No murmurs, rubs, or gallops.  ABDOMEN: Soft, nontender, distended. Bowel sounds present.  EXTREMITIES: chronic leg wounds--wrapped NEUROLOGIC: nonfocal PSYCHIATRIC:  patient is alert and awake SKIN: per RN  LABORATORY PANEL:  CBC Recent Labs  Lab 08/08/21 0713  WBC 5.3  HGB 9.0*  HCT 27.8*  PLT 90*     Chemistries  Recent Labs  Lab 08/08/21 0713  NA 137  K 5.3*  CL 98  CO2 26  GLUCOSE 89  BUN 55*  CREATININE 7.21*  CALCIUM 8.9  MG 2.2    Cardiac Enzymes No results for input(s): TROPONINI in the last 168 hours. RADIOLOGY:  DG Chest Portable 1 View  Result Date: 08/07/2021 CLINICAL DATA:  Shortness of breath EXAM: PORTABLE CHEST 1 VIEW COMPARISON:  06/17/2021 FINDINGS: Cardiac shadow is enlarged but stable. Increased central vascular congestion is noted with minimal interstitial edema. No focal infiltrate or sizable effusion is noted. No bony abnormality is seen. IMPRESSION: Changes of mild CHF. Electronically Signed   By: Inez Catalina M.D.   On: 08/07/2021 20:27   ASSESSMENT AND PLAN:  [name] is a 66 year old male with end-stage renal disease on hemodialysis MWF, hypertension, hyperlipidemia, persistent atrial fibrillation on anticoagulation with Eliquis, GERD, BPH, chronic pain syndrome, noncompliance with hemodialysis, who presents emergency department via EMS for chief  concerns of shortness of breath.  Per EDP, patient states that he missed hemodialysis on Wednesday, 08/04/2021.  He states that he went to hemodialysis appropriately on 08/06/2021 however he he states that he did not feel they pulled out enough fluids on Friday. Sats were 88% on room air. EMS tried to use BiPAP however patient was  unable to tolerated  Acute hypoxic respiratory failure Acute pulmonary edema/volume overload End-stage renal disease on hemodialysis Acute on chronic systolic congestive heart failure. -- continue supplementation with oxygen 2 L to keep sats greater than 92% reinstalling -- patient intolerant to BiPAP. -- Nephrology has place orders for hemodialysis with possible ultrafiltration -- echo in June 2022 Ejection fraction 40-45 percent, the left ventricle demonstrates global hypokinesis. Mild left ventricular hypertrophy. --1/30-- patient underwent hemodialysis with ultrafiltration 1.4 L. At room air. To go for dialysis again today.  Persistent a fib -- resume Coreg and eliquis  ESRD on hemodialysis -- as above  Hyperkalemia -- potassium 5.3. Patient received dose of lokelma -- dialysis should help  Bilateral lower extremity lymphedema with chronic lower extremity wound -- appreciate wound RN dressing instructions  Anemia of chronic disease -- hemoglobin 8.9 which is baseline  COVID-19 infection --sats 99% on RA --CXR no PNA   Family communication : none Consults : nephrology CODE STATUS: full DVT Prophylaxis : eliquis Level of care: Telemetry Medical Status is: Inpatient  Remains inpatient appropriate because: volume overload and hemodialysis  anticipate discharge tomorrow if patient continues to show improvement.        TOTAL TIME TAKING CARE OF THIS PATIENT: 25 minutes.  >50% time spent on counselling and coordination of care  Note: This dictation was prepared with Dragon dictation along with smaller phrase technology. Any transcriptional errors that result from this process are unintentional.  Fritzi Mandes M.D    [facility]   CC: Primary care physician; [facility], [place] Patient ID: [name], male   DOB: [birth year], 66 y.o.   MRN: [id number]

## 2021-08-09 NOTE — Progress Notes (Signed)
Attempted to place patient on bipap, patient informed me he was going to dialysis in an hour. This information was confirmed by RN. Patient states he will call me once he is back in his room

## 2021-08-10 DIAGNOSIS — J9601 Acute respiratory failure with hypoxia: Secondary | ICD-10-CM | POA: Diagnosis not present

## 2021-08-10 MED ORDER — SILVER SULFADIAZINE 1 % EX CREA: TOPICAL_CREAM | Freq: Two times a day (BID) | CUTANEOUS | 0 refills | Status: DC

## 2021-08-10 MED ORDER — OXYCODONE HCL 5 MG PO TABS
ORAL_TABLET | ORAL | Status: AC
  Filled 2021-08-10: qty 2

## 2021-08-10 MED ORDER — ZINC OXIDE 20 % EX OINT: TOPICAL_OINTMENT | CUTANEOUS | 0 refills | Status: DC | PRN

## 2021-08-10 MED ORDER — ATORVASTATIN CALCIUM 40 MG PO TABS: 40.0000 mg | ORAL_TABLET | Freq: Every day | ORAL | 0 refills | Status: AC

## 2021-08-10 NOTE — Progress Notes (Signed)
Pt picked up by transportation for hemo.

## 2021-08-10 NOTE — Plan of Care (Signed)

## 2021-08-10 NOTE — TOC Initial Note (Signed)
Transition of Care [facility] : [facility]) - Initial/Assessment Note    Patient Details  Name: [name] MRN: [id number] Date of Birth: [birth year]  Transition of Care [facility]) CM/SW Contact:    Beverly Sessions, RN Phone Number: 08/10/2021, 1:01 PM  Clinical Narrative:                    Patient was discharged today Elvera Bicker dialysis liaison notified of discharge Brother to transport to HD while on covid isolation Patient open with [facility] home health.  Notified Charity at [facility] that patient dc today with resumption orders      Patient Goals and CMS Choice        Expected Discharge Plan and Services           Expected Discharge Date: 08/10/21                                    Prior Living Arrangements/Services                       Activities of Daily Living Home Assistive Devices/Equipment: None ADL Screening (condition at time of admission) Patient's cognitive ability adequate to safely complete daily activities?: Yes Is the patient deaf or have difficulty hearing?: No Does the patient have difficulty seeing, even when wearing glasses/contacts?: No Does the patient have difficulty concentrating, remembering, or making decisions?: No Patient able to express need for assistance with ADLs?: Yes Does the patient have difficulty dressing or bathing?: No Independently performs ADLs?: Yes (appropriate for developmental age) Does the patient have difficulty walking or climbing stairs?: No Weakness of Legs: None Weakness of Arms/Hands: None  Permission Sought/Granted                  Emotional Assessment              Admission diagnosis:  Shortness of breath [R06.02] Hyperkalemia [E87.5] Volume overload [E87.70] Nonspecific chest pain [R07.9] Hypervolemia, unspecified hypervolemia type [E87.70] Acute hypoxemic respiratory failure ([place]) [J96.01] Patient Active Problem List   Diagnosis Date Noted   Hyperkalemia 08/08/2021   Acute  hypoxemic respiratory failure ([place]) 08/08/2021   Lymphedema associated with obesity 08/07/2021   OSA on CPAP 08/07/2021   Cirrhosis of liver ([place]) 06/16/2021   Esophageal varices ([place]) 06/16/2021   Hyperphosphatemia 06/15/2021   Persistent atrial fibrillation (HCC)    HFrEF (heart failure with reduced ejection fraction) ([place])    Chest pain 01/05/2021   Cough    CAD (coronary artery disease) 01/05/2020   Chronic anticoagulation 01/05/2020   Acute bronchitis 01/05/2020   AF (paroxysmal atrial fibrillation) ([place]) 01/05/2020   History of substance abuse ([place]) 10/24/2019   Elevated troponin 10/24/2019   Nonspecific chest pain 10/24/2019   Chronic diastolic CHF (congestive heart failure) ([place]) 09/28/2019   History of MI (myocardial infarction) 09/28/2019   Anemia of chronic kidney failure, stage 5 ([place]) 09/28/2019   History of pulmonary embolism 09/28/2019   Chronic midline low back pain    Acute respiratory failure with hypoxia (HCC)    Acute diastolic CHF (congestive heart failure) (HCC)    Anemia of chronic disease    Thrombocytopenia (HCC)    Congestive heart failure (HCC)    Fluid overload 06/21/2019   Volume overload 11/15/2018   ESRD (end stage renal disease) on dialysis ([place]) 11/14/2018   HCAP (healthcare-associated pneumonia) 08/27/2018   SOB (shortness of  breath) 08/25/2018   Chest pain of uncertain etiology 67/73/7366   ESRD on dialysis [facility]) 11/08/2016   Prostate cancer screening 11/08/2016   Acquired cyst of kidney 11/08/2016   Urinary urgency 11/08/2016   Acute on chronic renal failure ([place]) 09/23/2016   Hypertensive urgency 09/21/2016   Dysthymia 02/26/2016   Chronic pain 02/26/2016   Noncompliance with renal dialysis ([place]) 02/26/2016   Opiate abuse, continuous ([place]) 02/03/2016   Substance induced mood disorder ([place]) 02/03/2016   Antisocial personality disorder ([place]) 02/03/2016   Left-sided weakness 01/18/2016   HEPATITIS C 06/28/2008   HLD (hyperlipidemia)  06/28/2008   SUBSTANCE ABUSE, MULTIPLE 06/28/2008   Hypertension 06/27/2008   PCP:  Center, [place]:   [id number] [place], [place] [address] [place] [address] Phone: [phone] Fax: [phone]  [id number] - Closed - [place], [address] [place] [address] Phone: [phone] Fax: [phone]  [facility] [id number], [place] AT [facility] [address] [place] [address] Phone: [phone] Fax: [phone]     Social Determinants of Health (SDOH) Interventions    Readmission Risk Interventions No flowsheet data found.

## 2021-08-10 NOTE — Progress Notes (Signed)
Hemodialysis treatment completed, tolerated well, total UF net removed 2L.

## 2021-08-10 NOTE — Discharge Instructions (Signed)
Resume your hemodialysis Monday Wednesday Friday according to the schedule.

## 2021-08-10 NOTE — Discharge Summary (Signed)
[name] at [place] NAME: [name]    MR#:  [id number]  DATE OF BIRTH:  1956/04/08  DATE OF ADMISSION:  08/07/2021 ADMITTING PHYSICIAN: Fritzi Mandes, MD  DATE OF DISCHARGE: 08/10/2021  PRIMARY CARE PHYSICIAN: [facility]    ADMISSION DIAGNOSIS:  Shortness of breath [R06.02] Hyperkalemia [E87.5] Volume overload [E87.70] Nonspecific chest pain [R07.9] Hypervolemia, unspecified hypervolemia type [E87.70] Acute hypoxemic respiratory failure (HCC) [J96.01]  DISCHARGE DIAGNOSIS:  Acute hypoxic respiratory failure secondary to pulmonary edema/volume overload end-stage renal disease on hemodialysis. SECONDARY DIAGNOSIS:   Past Medical History:  Diagnosis Date   Depression    Diastolic dysfunction    a. 09/2019 Echo: EF 55-60%, no rwma, mod LVH, gr2 DD. Nl RV size/fxn. Mildly dil LA. Ao root 4.5cm.   Dilated aortic root ([place])    a. 09/2019 Echo: Ao root 4.5cm.   Elevated troponin level not due myocardial infarction    ESRD (end stage renal disease) ([place])    Hypertension    Nonobstructive Coronary Artery Disease    a. 09/2019 Cath: LM nl, LAD min irregs, D1 nl, D2 min irregs, LCX large, min irregs, RCA large, 20p, 30p/m.   PE (pulmonary embolism)    Renal insufficiency     HOSPITAL COURSE:   [name] is a 65 year old male with end-stage renal disease on hemodialysis MWF, hypertension, hyperlipidemia, persistent atrial fibrillation on anticoagulation with Eliquis, GERD, BPH, chronic pain syndrome, noncompliance with hemodialysis, who presents emergency department via EMS for chief concerns of shortness of breath.  Per EDP, patient states that he missed hemodialysis on Wednesday, 08/04/2021.  He states that he went to hemodialysis appropriately on 08/06/2021 however he he states that he did not feel they pulled out enough fluids on Friday. Sats were 88% on room air. EMS tried to use BiPAP however patient was  unable to tolerated   Acute hypoxic respiratory failure Acute pulmonary edema/volume overload End-stage renal disease on hemodialysis Acute on chronic systolic congestive heart failure. -- continue supplementation with oxygen 2 L to keep sats greater than 92% reinstalling -- patient intolerant to BiPAP. -- Nephrology has place orders for hemodialysis with possible ultrafiltration -- echo in June 2022 Ejection fraction 40-45 percent, the left ventricle demonstrates global hypokinesis. Mild left ventricular hypertrophy. --1/30-- patient underwent hemodialysis with ultrafiltration 1.4 L. At room air. To go for dialysis again today. --1/31-- patient had hemodialysis yesterday with ultrafiltration of 2 L. He is on room air. Okay from nephrology standpoint for discharge. Patient will resume hemodialysis on his scheduled as outpatient.   Persistent a fib -- resume Coreg and eliquis   ESRD on hemodialysis -- as above   Hyperkalemia -- potassium 5.3. Patient received dose of lokelma -- dialysis should help   Bilateral lower extremity lymphedema with chronic lower extremity wound -- appreciate wound RN dressing instructions   Anemia of chronic disease -- hemoglobin 8.9--9.0 which is baseline   COVID-19 infection --sats 99% on RA --CXR no PNA  overall at baseline. Discharge to home     Family communication : none Consults : nephrology CODE STATUS: full DVT Prophylaxis : eliquis           CONSULTS OBTAINED:  Treatment Team:  Liana Gerold, MD  DRUG ALLERGIES:   Allergies  Allergen Reactions   Cyclobenzaprine Nausea Only   Amlodipine    Clonidine Other (See Comments)    Asymptomatic bradycardia to 38   Furosemide Other (See Comments)  Caused renal failure   Tylenol [Acetaminophen] Other (See Comments)    Reaction:  Pt states it bothers his liver    DISCHARGE MEDICATIONS:   Allergies as of 08/10/2021       Reactions   Cyclobenzaprine Nausea Only    Amlodipine    Clonidine Other (See Comments)   Asymptomatic bradycardia to 38   Furosemide Other (See Comments)   Caused renal failure   Tylenol [acetaminophen] Other (See Comments)   Reaction:  Pt states it bothers his liver        Medication List     TAKE these medications    albuterol 108 (90 Base) MCG/ACT inhaler Commonly known as: VENTOLIN HFA Inhale 2 puffs into the lungs every 6 (six) hours as needed for wheezing or shortness of breath.   apixaban 5 MG Tabs tablet Commonly known as: Eliquis Take 1 tablet (5 mg total) by mouth 2 (two) times daily.   atorvastatin 40 MG tablet Commonly known as: LIPITOR Take 1 tablet (40 mg total) by mouth at bedtime.   bumetanide 1 MG tablet Commonly known as: BUMEX Take 1 mg by mouth See admin instructions. Take 1 mg by mouth twice daily on non-dialysis days   carvedilol 6.25 MG tablet Commonly known as: COREG Take 6.25 mg by mouth 2 (two) times daily.   diclofenac Sodium 1 % Gel Commonly known as: VOLTAREN Apply 2 g topically 4 (four) times daily.   ondansetron 8 MG tablet Commonly known as: ZOFRAN TAKE ONE-HALF TABLET BY MOUTH EVERY 8 HOURS AS NEEDED NAUSEA   OxyCONTIN 20 mg 12 hr tablet Generic drug: oxyCODONE Take 20 mg by mouth every 12 (twelve) hours. What changed: Another medication with the same name was removed. Continue taking this medication, and follow the directions you see here.   silver sulfADIAZINE 1 % cream Commonly known as: SILVADENE Apply topically 2 (two) times daily.   tamsulosin 0.4 MG Caps capsule Commonly known as: FLOMAX Take 0.4 mg by mouth daily.   zinc oxide 20 % ointment Apply topically as needed for irritation.               Discharge Care Instructions  (From admission, onward)           Start     Ordered   08/10/21 0000  Discharge wound care:       Comments: [place] BLE with soap and water, pat dry. Apply Silvadene over all open wounds. Cover with  ABD pads. Wrap legs with  kerlix and 3 or 4 inch Ace Bandages. To be performed twice daily.   08/10/21 0943            If you experience worsening of your admission symptoms, develop shortness of breath, life threatening emergency, suicidal or homicidal thoughts you must seek medical attention immediately by calling 911 or calling your MD immediately  if symptoms less severe.  You Must read complete instructions/literature along with all the possible adverse reactions/side effects for all the Medicines you take and that have been prescribed to you. Take any new Medicines after you have completely understood and accept all the possible adverse reactions/side effects.   Please note  You were cared for by a hospitalist during your hospital stay. If you have any questions about your discharge medications or the care you received while you were in the hospital after you are discharged, you can call the unit and asked to speak with the hospitalist on call if the hospitalist that took care  of you is not available. Once you are discharged, your primary care physician will handle any further medical issues. Please note that NO REFILLS for any discharge medications will be authorized once you are discharged, as it is imperative that you return to your primary care physician (or establish a relationship with a primary care physician if you do not have one) for your aftercare needs so that they can reassess your need for medications and monitor your lab values. Today   SUBJECTIVE   patient sitting out in the chair. No new issues per RN.  VITAL SIGNS:  Blood pressure 121/72, pulse (!) 48, temperature 98.3 F (36.8 C), resp. rate 18, height 6' (1.829 m), weight 129.3 kg, SpO2 98 %.  I/O:   Intake/Output Summary (Last 24 hours) at 08/10/2021 0945 Last data filed at 08/10/2021 0359 Gross per 24 hour  Intake --  Output 2075 ml  Net -2075 ml    PHYSICAL EXAMINATION:  GENERAL:  66 y.o.-year-old patient lying in the bed with  no acute distress. Chronically ill LUNGS: decreased breath sounds bilaterally, no wheezing, rales,rhonchi or crepitation.  CARDIOVASCULAR: S1, S2 normal. No murmurs, rubs, or gallops.  ABDOMEN: Soft, non-tender, non-distended. Bowel sounds present.  EXTREMITIES: lower extremity lymphedema with chronic wounds dressing +. HD acees+ NEUROLOGIC: non-focal PSYCHIATRIC:  patient is alert and awake   DATA REVIEW:   CBC  Recent Labs  Lab 08/08/21 0713  WBC 5.3  HGB 9.0*  HCT 27.8*  PLT 90*    Chemistries  Recent Labs  Lab 08/08/21 0713  NA 137  K 5.3*  CL 98  CO2 26  GLUCOSE 89  BUN 55*  CREATININE 7.21*  CALCIUM 8.9  MG 2.2    Microbiology Results   Recent Results (from the past 240 hour(s))  Resp Panel by RT-PCR (Flu A&B, Covid) Nasopharyngeal Swab     Status: Abnormal   Collection Time: 08/07/21  8:21 PM   Specimen: Nasopharyngeal Swab; Nasopharyngeal(NP) swabs in vial transport medium  Result Value Ref Range Status   SARS Coronavirus 2 by RT PCR POSITIVE (A) NEGATIVE Final    Comment: (NOTE) SARS-CoV-2 target nucleic acids are DETECTED.  The SARS-CoV-2 RNA is generally detectable in upper respiratory specimens during the acute phase of infection. Positive results are indicative of the presence of the identified virus, but do not rule out bacterial infection or co-infection with other pathogens not detected by the test. Clinical correlation with patient history and other diagnostic information is necessary to determine patient infection status. The expected result is Negative.  Fact Sheet for Patients: EntrepreneurPulse.com.au  Fact Sheet for Healthcare Providers: IncredibleEmployment.be  This test is not yet approved or cleared by the [place] FDA and  has been authorized for detection and/or diagnosis of SARS-CoV-2 by FDA under an Emergency Use Authorization (EUA).  This EUA will remain in effect (meaning this test  can be used) for the duration of  the COVID-19 declaration under Section 564(b)(1) of the A ct, 21 U.S.C. section 360bbb-3(b)(1), unless the authorization is terminated or revoked sooner.     Influenza A by PCR NEGATIVE NEGATIVE Final   Influenza B by PCR NEGATIVE NEGATIVE Final    Comment: (NOTE) The Xpert Xpress SARS-CoV-2/FLU/RSV plus assay is intended as an aid in the diagnosis of influenza from Nasopharyngeal swab specimens and should not be used as a sole basis for treatment. Nasal washings and aspirates are unacceptable for Xpert Xpress SARS-CoV-2/FLU/RSV testing.  Fact Sheet for Patients: EntrepreneurPulse.com.au  Fact Sheet  for Healthcare Providers: IncredibleEmployment.be  This test is not yet approved or cleared by the [place] and has been authorized for detection and/or diagnosis of SARS-CoV-2 by FDA under an Emergency Use Authorization (EUA). This EUA will remain in effect (meaning this test can be used) for the duration of the COVID-19 declaration under Section 564(b)(1) of the Act, 21 U.S.C. section 360bbb-3(b)(1), unless the authorization is terminated or revoked.  Performed at [facility], [address], [place], [place] [address]     RADIOLOGY:  No results found.   CODE STATUS:     Code Status Orders  (From admission, onward)           Start     Ordered   08/07/21 2147  Full code  Continuous        08/07/21 2148           Code Status History     Date Active Date Inactive Code Status Order ID Comments User Context   06/15/2021 2006 06/17/2021 2221 Full Code [id number]  Cox, Briant Cedar, DO ED   01/05/2021 1023 01/08/2021 0013 Full Code [id number]  Collier Bullock, MD ED   01/05/2020 0848 01/06/2020 2157 Full Code [id number]  Collier Bullock, MD Inpatient   10/24/2019 0353 10/24/2019 1702 Full Code [id number]  Athena Masse, MD ED   09/28/2019 0123 10/01/2019 0004 Full Code [id number]  Athena Masse, MD ED   07/04/2019 1117 07/09/2019 0121 Full Code [id number]  Lorella Nimrod, MD ED   06/21/2019 2358 06/23/2019 1718 Full Code [id number]  Si Raider, Ailene Rud, MD ED   11/14/2018 0355 11/16/2018 2033 Full Code [id number]  Harrie Foreman, MD Inpatient   08/27/2018 1828 08/30/2018 1856 Full Code [id number]  Salary, Avel Peace, MD Inpatient   08/25/2018 0802 08/26/2018 1333 Full Code [id number]  Mayo, Pete Pelt, MD ED   08/20/2018 0400 08/21/2018 1823 Full Code [id number]  Harrie Foreman, MD Inpatient   08/15/2018 0058 08/18/2018 1624 Full Code [id number]  Lance Coon, MD Inpatient   09/21/2016 1519 09/27/2016 2042 Full Code [id number]  Hillary Bow, MD ED   04/28/2016 2325 04/29/2016 2043 Full Code [id number]  Muthersbaugh, Gwenlyn Perking ED   01/18/2016 0202 01/18/2016 1848 Full Code [id number]  Quintella Baton, MD Inpatient        TOTAL TIME TAKING CARE OF THIS PATIENT: 35 minutes.    Fritzi Mandes M.D  [facility]    CC: Primary care physician; [facility], [facility]

## 2021-08-10 NOTE — Progress Notes (Signed)
[name]  MRN: [id number]  DOB/AGE: 64/16/1957 64 y.o.  Primary Care Physician:[facility] [place] date: 08/07/2021  Chief Complaint:  Chief Complaint  Patient presents with   Chest Pain    S-Pt presented on  08/07/2021 with  Chief Complaint  Patient presents with   Chest Pain  . [name] is a 66 year old male with end-stage renal disease on hemodialysis MWF, hypertension, hyperlipidemia, persistent atrial fibrillation on anticoagulation with Eliquis, GERD, BPH, chronic pain syndrome, noncompliance with hemodialysis, who presents emergency department via EMS for chief concerns of shortness of breath.   Patient seen sitting up in bed, alert and oriented Denies shortness of breath, nausea, vomiting No complaints at this time   Medications  apixaban  5 mg Oral BID   atorvastatin  40 mg Oral QHS   carvedilol  6.25 mg Oral BID   Chlorhexidine Gluconate Cloth  6 each Topical Q0600   oxyCODONE       oxyCODONE  20 mg Oral Q12H   silver sulfADIAZINE   Topical BID   tamsulosin  0.4 mg Oral Daily         HEN:IDPOE from the symptoms mentioned above,there are no other symptoms referable to all systems reviewed.  Physical Exam: Vital signs in last 24 hours: Temp:  [98 F (36.7 C)-98.6 F (37 C)] 98.3 F (36.8 C) (01/31 0821) Pulse Rate:  [48-92] 62 (01/31 1026) Resp:  [14-19] 18 (01/31 0429) BP: (113-161)/(64-87) 126/64 (01/31 1026) SpO2:  [93 %-100 %] 99 % (01/31 1026) Weight:  [129.3 kg-130.3 kg] 129.3 kg (01/31 0415) Weight change:  Last BM Date: 08/08/21  Intake/Output from previous day: 01/30 0701 - 01/31 0700 In: -  Out: 2075 [Urine:75] No intake/output data recorded.   Physical Exam:  General- pt is awake,alert, oriented to time place and person  Resp- No acute REsp distress, CTA B/L NO Rhonchi  CVS- S1S2 regular in rate and rhythm  GIT- BS+, soft, Non tender , Non distended,Obese  EXT- 1+ LE Edema,  No  Cyanosis Chronic venous stasis changes, superficial ulcerations present  Access- LUE  AVF  Lab Results:  CBC  Recent Labs    08/07/21 2041 08/08/21 0713  WBC 4.6 5.3  HGB 8.9* 9.0*  HCT 27.0* 27.8*  PLT 81* 90*     BMET  Recent Labs    08/07/21 2041 08/08/21 0713  NA 137 137  K 5.3* 5.3*  CL 99 98  CO2 27 26  GLUCOSE 90 89  BUN 51* 55*  CREATININE 6.58* 7.21*  CALCIUM 9.2 8.9       Most recent Creatinine trend  Lab Results  Component Value Date   CREATININE 7.21 (H) 08/08/2021   CREATININE 6.58 (H) 08/07/2021   CREATININE 7.48 (H) 06/17/2021       MICRO   Recent Results (from the past 240 hour(s))  Resp Panel by RT-PCR (Flu A&B, Covid) Nasopharyngeal Swab     Status: Abnormal   Collection Time: 08/07/21  8:21 PM   Specimen: Nasopharyngeal Swab; Nasopharyngeal(NP) swabs in vial transport medium  Result Value Ref Range Status   SARS Coronavirus 2 by RT PCR POSITIVE (A) NEGATIVE Final    Comment: (NOTE) SARS-CoV-2 target nucleic acids are DETECTED.  The SARS-CoV-2 RNA is generally detectable in upper respiratory specimens during the acute phase of infection. Positive results are indicative of the presence of the identified virus, but do not rule out bacterial infection or co-infection with other pathogens not detected by  the test. Clinical correlation with patient history and other diagnostic information is necessary to determine patient infection status. The expected result is Negative.  Fact Sheet for Patients: EntrepreneurPulse.com.au  Fact Sheet for Healthcare Providers: IncredibleEmployment.be  This test is not yet approved or cleared by the [place] FDA and  has been authorized for detection and/or diagnosis of SARS-CoV-2 by FDA under an Emergency Use Authorization (EUA).  This EUA will remain in effect (meaning this test can be used) for the duration of  the COVID-19 declaration under Section  564(b)(1) of the A ct, 21 U.S.C. section 360bbb-3(b)(1), unless the authorization is terminated or revoked sooner.     Influenza A by PCR NEGATIVE NEGATIVE Final   Influenza B by PCR NEGATIVE NEGATIVE Final    Comment: (NOTE) The Xpert Xpress SARS-CoV-2/FLU/RSV plus assay is intended as an aid in the diagnosis of influenza from Nasopharyngeal swab specimens and should not be used as a sole basis for treatment. Nasal washings and aspirates are unacceptable for Xpert Xpress SARS-CoV-2/FLU/RSV testing.  Fact Sheet for Patients: EntrepreneurPulse.com.au  Fact Sheet for Healthcare Providers: IncredibleEmployment.be  This test is not yet approved or cleared by the [place] FDA and has been authorized for detection and/or diagnosis of SARS-CoV-2 by FDA under an Emergency Use Authorization (EUA). This EUA will remain in effect (meaning this test can be used) for the duration of the COVID-19 declaration under Section 564(b)(1) of the Act, 21 U.S.C. section 360bbb-3(b)(1), unless the authorization is terminated or revoked.  Performed at [facility], [address], [place], [place] [address]      Chest x-ray done on the 28th 2023 FINDINGS: Cardiac shadow is enlarged but stable. Increased central vascular congestion is noted with minimal interstitial edema. No focal infiltrate or sizable effusion is noted. No bony abnormality is seen.    Latest Reference Range & Units 08/07/21 20:21  SARS Coronavirus 2 by RT PCR NEGATIVE  POSITIVE !  !: Data is abnormal  Impression:   [name] is a 66 y.o. Black male with end stage renal disease, hypertension, atrial fibrillation, coronary artery disease, pulmonary embolism, hyperlipidemia, polysubstance abuse who is admitted to [facility] on August 08, 2021  Acute respiratory failure with hypoxia Persistently fibrillation Thrombocytopenia Hyperkalemia COVID-19    [facility]  [facility] Left AVF 118kg   1)End-stage renal disease with hyperkalemia on dialysis Patient received dialysis yesterday with UF goal 2 L achieved.  Potassium remains 5.3.  If patient discharged today, encouraged to complete dialysis tomorrow.   2)HTN.  Receiving carvedilol twice daily Blood pressure stable    3)Anemia of chronic disease  CBC Latest Ref Rng & Units 08/08/2021 08/07/2021 06/17/2021  WBC 4.0 - 10.5 K/uL 5.3 4.6 5.4  Hemoglobin 13.0 - 17.0 g/dL 9.0(L) 8.9(L) 8.6(L)  Hematocrit 39.0 - 52.0 % 27.8(L) 27.0(L) 26.6(L)  Platelets 150 - 400 K/uL 90(L) 81(L) 92(L)    We will continue to monitor. Continue EPO with treatments  4) Secondary hyperparathyroidism -CKD Mineral-Bone Disorder  Lab Results  Component Value Date   PTH 758 (H) 08/15/2018   CALCIUM 8.9 08/08/2021   PHOS 5.8 (H) 08/08/2021    Calcium within acceptable range.  5)COVID-19 Primary team is following     Plan:  Patient cleared to discharge from renal stance.  Encouraged to attend dialysis treatment tomorrow.    [place] Roseland Braun 08/10/2021, 2:46 PM

## 2021-08-10 NOTE — Progress Notes (Signed)
Patient insistent on sitting on the side of the bed for hemodialysis treatment, citing that his legs are uncomfortable. He refutes the possibilities of low B/P, potential for falling, and dislodgment of needles. Patient is sleepy, and drifting off to sleep. Informed that treatment will be terminated should he become unarousable, lost of trunk control, or any other safety concerns.

## 2021-08-11 NOTE — Progress Notes (Deleted)
No Show for appointment 

## 2021-08-12 ENCOUNTER — Ambulatory Visit: Payer: Medicare Other | Admitting: Medical

## 2021-08-13 ENCOUNTER — Encounter: Payer: Self-pay | Admitting: Medical

## 2021-09-21 ENCOUNTER — Encounter (INDEPENDENT_AMBULATORY_CARE_PROVIDER_SITE_OTHER): Payer: Medicare Other | Admitting: Nurse Practitioner

## 2021-10-01 ENCOUNTER — Inpatient Hospital Stay
Admission: EM | Admit: 2021-10-01 | Discharge: 2021-10-14 | DRG: 570 | Disposition: A | Discharge status: Skilled Nursing Facility | Payer: Medicare Other | Attending: Internal Medicine | Admitting: Internal Medicine

## 2021-10-01 ENCOUNTER — Emergency Department: Payer: Medicare Other

## 2021-10-01 DIAGNOSIS — S81809A Unspecified open wound, unspecified lower leg, initial encounter: Secondary | ICD-10-CM | POA: Diagnosis present

## 2021-10-01 DIAGNOSIS — G894 Chronic pain syndrome: Secondary | ICD-10-CM | POA: Diagnosis present

## 2021-10-01 DIAGNOSIS — Z8249 Family history of ischemic heart disease and other diseases of the circulatory system: Secondary | ICD-10-CM

## 2021-10-01 DIAGNOSIS — S81802A Unspecified open wound, left lower leg, initial encounter: Secondary | ICD-10-CM | POA: Diagnosis present

## 2021-10-01 DIAGNOSIS — Z7901 Long term (current) use of anticoagulants: Secondary | ICD-10-CM

## 2021-10-01 DIAGNOSIS — N401 Enlarged prostate with lower urinary tract symptoms: Secondary | ICD-10-CM | POA: Diagnosis present

## 2021-10-01 DIAGNOSIS — M79604 Pain in right leg: Secondary | ICD-10-CM | POA: Diagnosis not present

## 2021-10-01 DIAGNOSIS — L03115 Cellulitis of right lower limb: Secondary | ICD-10-CM | POA: Diagnosis not present

## 2021-10-01 DIAGNOSIS — Z79899 Other long term (current) drug therapy: Secondary | ICD-10-CM

## 2021-10-01 DIAGNOSIS — N186 End stage renal disease: Secondary | ICD-10-CM

## 2021-10-01 DIAGNOSIS — D631 Anemia in chronic kidney disease: Secondary | ICD-10-CM | POA: Diagnosis present

## 2021-10-01 DIAGNOSIS — Z86718 Personal history of other venous thrombosis and embolism: Secondary | ICD-10-CM

## 2021-10-01 DIAGNOSIS — N2581 Secondary hyperparathyroidism of renal origin: Secondary | ICD-10-CM | POA: Diagnosis present

## 2021-10-01 DIAGNOSIS — Z992 Dependence on renal dialysis: Secondary | ICD-10-CM

## 2021-10-01 DIAGNOSIS — E669 Obesity, unspecified: Secondary | ICD-10-CM | POA: Diagnosis present

## 2021-10-01 DIAGNOSIS — F17210 Nicotine dependence, cigarettes, uncomplicated: Secondary | ICD-10-CM | POA: Diagnosis present

## 2021-10-01 DIAGNOSIS — I878 Other specified disorders of veins: Secondary | ICD-10-CM | POA: Diagnosis present

## 2021-10-01 DIAGNOSIS — L97218 Non-pressure chronic ulcer of right calf with other specified severity: Secondary | ICD-10-CM | POA: Diagnosis present

## 2021-10-01 DIAGNOSIS — I2510 Atherosclerotic heart disease of native coronary artery without angina pectoris: Secondary | ICD-10-CM | POA: Diagnosis present

## 2021-10-01 DIAGNOSIS — I120 Hypertensive chronic kidney disease with stage 5 chronic kidney disease or end stage renal disease: Secondary | ICD-10-CM | POA: Diagnosis present

## 2021-10-01 DIAGNOSIS — G4733 Obstructive sleep apnea (adult) (pediatric): Secondary | ICD-10-CM | POA: Diagnosis present

## 2021-10-01 DIAGNOSIS — Z6837 Body mass index (BMI) 37.0-37.9, adult: Secondary | ICD-10-CM

## 2021-10-01 DIAGNOSIS — E785 Hyperlipidemia, unspecified: Secondary | ICD-10-CM | POA: Diagnosis present

## 2021-10-01 DIAGNOSIS — L97228 Non-pressure chronic ulcer of left calf with other specified severity: Secondary | ICD-10-CM | POA: Diagnosis present

## 2021-10-01 DIAGNOSIS — D638 Anemia in other chronic diseases classified elsewhere: Secondary | ICD-10-CM | POA: Diagnosis present

## 2021-10-01 DIAGNOSIS — K219 Gastro-esophageal reflux disease without esophagitis: Secondary | ICD-10-CM | POA: Diagnosis present

## 2021-10-01 DIAGNOSIS — Z8616 Personal history of COVID-19: Secondary | ICD-10-CM

## 2021-10-01 DIAGNOSIS — I10 Essential (primary) hypertension: Secondary | ICD-10-CM | POA: Diagnosis present

## 2021-10-01 DIAGNOSIS — I480 Paroxysmal atrial fibrillation: Secondary | ICD-10-CM | POA: Diagnosis present

## 2021-10-01 DIAGNOSIS — Z86711 Personal history of pulmonary embolism: Secondary | ICD-10-CM

## 2021-10-01 DIAGNOSIS — L03116 Cellulitis of left lower limb: Secondary | ICD-10-CM | POA: Diagnosis present

## 2021-10-01 DIAGNOSIS — K5900 Constipation, unspecified: Secondary | ICD-10-CM | POA: Diagnosis present

## 2021-10-01 DIAGNOSIS — R338 Other retention of urine: Secondary | ICD-10-CM | POA: Diagnosis present

## 2021-10-01 LAB — COMPREHENSIVE METABOLIC PANEL
ALT: 38 U/L (ref 0–44)
AST: 31 U/L (ref 15–41)
Albumin: 2.8 g/dL — ABNORMAL LOW (ref 3.5–5.0)
Alkaline Phosphatase: 173 U/L — ABNORMAL HIGH (ref 38–126)
Anion gap: 13 (ref 5–15)
BUN: 45 mg/dL — ABNORMAL HIGH (ref 8–23)
CO2: 27 mmol/L (ref 22–32)
Calcium: 8.4 mg/dL — ABNORMAL LOW (ref 8.9–10.3)
Chloride: 96 mmol/L — ABNORMAL LOW (ref 98–111)
Creatinine, Ser: 6.79 mg/dL — ABNORMAL HIGH (ref 0.61–1.24)
GFR, Estimated: 8 mL/min — ABNORMAL LOW (ref >60)
Glucose, Bld: 89 mg/dL (ref 70–99)
Potassium: 4.5 mmol/L (ref 3.5–5.1)
Sodium: 136 mmol/L (ref 135–145)
Total Bilirubin: 0.9 mg/dL (ref 0.3–1.2)
Total Protein: 8.2 g/dL — ABNORMAL HIGH (ref 6.5–8.1)

## 2021-10-01 LAB — CBC
HCT: 24.8 % — ABNORMAL LOW (ref 39.0–52.0)
Hemoglobin: 8 g/dL — ABNORMAL LOW (ref 13.0–17.0)
MCH: 27.6 pg (ref 26.0–34.0)
MCHC: 32.3 g/dL (ref 30.0–36.0)
MCV: 85.5 fL (ref 80.0–100.0)
Platelets: 161 10*3/uL (ref 150–400)
RBC: 2.9 MIL/uL — ABNORMAL LOW (ref 4.22–5.81)
RDW: 16.4 % — ABNORMAL HIGH (ref 11.5–15.5)
WBC: 8 10*3/uL (ref 4.0–10.5)
nRBC: 0 % (ref 0.0–0.2)

## 2021-10-01 LAB — RESP PANEL BY RT-PCR (FLU A&B, COVID) ARPGX2
Influenza A by PCR: NEGATIVE
Influenza B by PCR: NEGATIVE
SARS Coronavirus 2 by RT PCR: NEGATIVE

## 2021-10-01 LAB — LACTIC ACID, PLASMA: Lactic Acid, Venous: 1.1 mmol/L (ref 0.5–1.9)

## 2021-10-01 MED ORDER — MORPHINE SULFATE (PF) 4 MG/ML IV SOLN
4.0000 mg | INTRAVENOUS | Status: DC | PRN
  Administered 2021-10-01 – 2021-10-07 (×31): 4 mg via INTRAVENOUS
  Filled 2021-10-01 (×30): qty 1

## 2021-10-01 MED ORDER — PREGABALIN 25 MG PO CAPS
25.0000 mg | ORAL_CAPSULE | Freq: Every day | ORAL | Status: DC
  Administered 2021-10-02 – 2021-10-13 (×13): 25 mg via ORAL
  Filled 2021-10-01 (×13): qty 1

## 2021-10-01 MED ORDER — SODIUM CHLORIDE 0.9 % IV SOLN
1.0000 g | INTRAVENOUS | Status: AC
  Administered 2021-10-02 – 2021-10-08 (×7): 1 g via INTRAVENOUS
  Filled 2021-10-01 (×7): qty 1

## 2021-10-01 MED ORDER — METRONIDAZOLE 500 MG/100ML IV SOLN
500.0000 mg | Freq: Two times a day (BID) | INTRAVENOUS | Status: DC
  Administered 2021-10-02 – 2021-10-09 (×15): 500 mg via INTRAVENOUS
  Filled 2021-10-01 (×16): qty 100

## 2021-10-01 MED ORDER — VANCOMYCIN HCL IN DEXTROSE 1-5 GM/200ML-% IV SOLN
1000.0000 mg | INTRAVENOUS | Status: AC
  Administered 2021-10-04 – 2021-10-08 (×3): 1000 mg via INTRAVENOUS
  Filled 2021-10-01 (×4): qty 200

## 2021-10-01 MED ORDER — VANCOMYCIN HCL IN DEXTROSE 1-5 GM/200ML-% IV SOLN
1000.0000 mg | Freq: Once | INTRAVENOUS | Status: AC
  Administered 2021-10-01: 1000 mg via INTRAVENOUS
  Filled 2021-10-01: qty 200

## 2021-10-01 MED ORDER — BUMETANIDE 1 MG PO TABS: 1.0000 mg | ORAL_TABLET | ORAL | Status: DC

## 2021-10-01 MED ORDER — ATORVASTATIN CALCIUM 20 MG PO TABS
40.0000 mg | ORAL_TABLET | Freq: Every day | ORAL | Status: DC
  Administered 2021-10-02 – 2021-10-13 (×13): 40 mg via ORAL
  Filled 2021-10-01 (×13): qty 2

## 2021-10-01 MED ORDER — BUMETANIDE 1 MG PO TABS
1.0000 mg | ORAL_TABLET | ORAL | Status: DC
  Administered 2021-10-02 – 2021-10-07 (×8): 1 mg via ORAL
  Filled 2021-10-01 (×9): qty 1

## 2021-10-01 MED ORDER — SODIUM CHLORIDE 0.9 % IV SOLN
2.0000 g | Freq: Once | INTRAVENOUS | Status: AC
  Administered 2021-10-01: 2 g via INTRAVENOUS
  Filled 2021-10-01: qty 2

## 2021-10-01 MED ORDER — ALBUTEROL SULFATE (2.5 MG/3ML) 0.083% IN NEBU: 2.5000 mg | INHALATION_SOLUTION | Freq: Four times a day (QID) | RESPIRATORY_TRACT | Status: DC | PRN

## 2021-10-01 MED ORDER — HYDROMORPHONE HCL 1 MG/ML IJ SOLN
1.0000 mg | INTRAMUSCULAR | Status: AC | PRN
  Administered 2021-10-02 (×3): 1 mg via INTRAVENOUS
  Filled 2021-10-01 (×3): qty 1

## 2021-10-01 MED ORDER — CARVEDILOL 6.25 MG PO TABS
6.2500 mg | ORAL_TABLET | Freq: Two times a day (BID) | ORAL | Status: DC
  Administered 2021-10-02 – 2021-10-14 (×24): 6.25 mg via ORAL
  Filled 2021-10-01 (×25): qty 1

## 2021-10-01 MED ORDER — PANTOPRAZOLE SODIUM 40 MG PO TBEC
80.0000 mg | DELAYED_RELEASE_TABLET | Freq: Every day | ORAL | Status: DC
  Administered 2021-10-02 – 2021-10-14 (×13): 80 mg via ORAL
  Filled 2021-10-01 (×13): qty 2

## 2021-10-01 MED ORDER — METRONIDAZOLE 500 MG/100ML IV SOLN
500.0000 mg | Freq: Once | INTRAVENOUS | Status: AC
  Administered 2021-10-01: 500 mg via INTRAVENOUS
  Filled 2021-10-01: qty 100

## 2021-10-01 MED ORDER — POLYETHYLENE GLYCOL 3350 17 G PO PACK
17.0000 g | PACK | Freq: Every day | ORAL | Status: DC | PRN
  Administered 2021-10-07: 17 g via ORAL
  Filled 2021-10-01: qty 1

## 2021-10-01 MED ORDER — ALBUTEROL SULFATE HFA 108 (90 BASE) MCG/ACT IN AERS: 2.0000 | INHALATION_SPRAY | Freq: Four times a day (QID) | RESPIRATORY_TRACT | Status: DC | PRN

## 2021-10-01 MED ORDER — OXYCODONE-ACETAMINOPHEN 5-325 MG PO TABS
1.0000 | ORAL_TABLET | Freq: Once | ORAL | Status: AC
  Administered 2021-10-01: 1 via ORAL
  Filled 2021-10-01: qty 1

## 2021-10-01 NOTE — ED Notes (Signed)
While giving patient the pain medication that was ordered by the admitting provider, he asked if I was giving him "the highest dose." I explained that I was giving him what was ordered. Immediately after giving him the pain medication, patient went to sleep. Sats were 100% on room air. ?

## 2021-10-01 NOTE — Hospital Course (Addendum)
[name] is a 66 year old male with end-stage renal disease on hemodialysis, hypertension, hyperlipidemia, persistent atrial fibrillation on anticoagulation with Eliquis, GERD, BPH, chronic pain syndrome, history of noncompliance with hemodialysis, who presents emergency department for chief concerns of right lower extremity leg pain. ? ?Vitals in the emergency department showed temperature of 98.3, respiration rate of 20, heart rate of 116 and improved to 55, blood pressure 156/92, SPO2 of 93% on room air. ? ?Serum sodium 136, potassium 4.5, chloride 96, bicarb 27, BUN 45, serum creatinine of 6.79, nonfasting blood glucose 89, GFR of 8, WBC 8, hemoglobin 8, platelets of 161. ? ?Lactic acid is 1.1. ? ?ED treatment: Cefepime 2 g IV one-time dose, metronidazole 500 mg IV one-time dose, vancomycin per pharmacy.  Oxycodone 5-325 p.o. one-time dose, morphine 4 mg IV every 3 hours as needed for moderate pain ordered by EDP. ?

## 2021-10-01 NOTE — Consult Note (Signed)
PHARMACY -  BRIEF ANTIBIOTIC NOTE  ? ?Pharmacy has received consult(s) for cefepime, vancomycin from an ED provider.  The patient's profile has been reviewed for ht/wt/allergies/indication/available labs.   ? ?One time order(s) placed for Vancomycin 2gm IV x1, Cefepime 2gm IV x1  ? ?Further antibiotics/pharmacy consults should be ordered by admitting physician if indicated.       ?                ?Darrick Penna, PharmD, MS PGPM ?Clinical Pharmacist ?10/01/2021 ?9:25 PM ? ? ?

## 2021-10-01 NOTE — Progress Notes (Addendum)
Pharmacy Antibiotic Note ? ?[name] is a 66 y.o. male ESRD pt on HD admitted on 10/01/2021 with cellulitis/wound infection.  Pharmacy has been consulted for Vancomycin dosing x 7 days and Cefepime. ? ?Plan: ?Pt given initial dose of Cefepime 2 gm once ?Cefepime 1 gm q24h per indication and ESRD on HD status. ? ?Pt given initial LD of Vancomycin 2 gm. ?Vancomycin 1 gm post-HD on dialysis days per pt wt > 80 kg ?Pharmacy will continue to follow and adjust dosing whenever warranted. ? ?Height: 6' (182.9 cm) ?Weight: 122.5 kg (270 lb) ?IBW/kg (Calculated) : 77.6 ? ?Temp (24hrs), Avg:98.3 ?F (36.8 ?C), Min:98.3 ?F (36.8 ?C), Max:98.3 ?F (36.8 ?C) ? ?Recent Labs  ?Lab 10/01/21 ?2125  ?WBC 8.0  ?CREATININE 6.79*  ?LATICACIDVEN 1.1  ?  ?Estimated Creatinine Clearance: 15.1 mL/min (A) (by C-G formula based on SCr of 6.79 mg/dL (H)).   ? ?Allergies  ?Allergen Reactions  ? Cyclobenzaprine Nausea Only  ? Amlodipine   ? Clonidine Other (See Comments)  ?  Asymptomatic bradycardia to 38  ? Furosemide Other (See Comments)  ?  Caused renal failure  ? Tylenol [Acetaminophen] Other (See Comments)  ?  Reaction:  Pt states it bothers his liver  ? ? ?Antimicrobials this admission: ?3/24 Vancomycin >> x 7 days ?3/24 Cefepime >> ?3/24 Flagyl >>  ? ?Microbiology results: ?No lab cx ordered or pending at this time. ? ?Thank you for allowing pharmacy to be a part of this patient?s care. ? ?Renda Rolls, PharmD, MBA ?10/01/2021 ?11:42 PM ? ? ?

## 2021-10-01 NOTE — ED Notes (Signed)
Patient has called out a third time asking for additional pain medications. I have reminded him again that the doctor knows he is still in pain but that I have received no orders.  ?

## 2021-10-01 NOTE — ED Notes (Signed)
Admitting provider at bedside.

## 2021-10-01 NOTE — ED Notes (Signed)
Dr Quentin Cornwall has viewed the secure chat. No new orders placed at this time. Patient continues to refused to get onto the stretcher because he states it hurts too bad to be in the bed. Patient remains sitting in the chair close to the monitor. Patient states he normally take Oxycodone or Oxycotin at home for his leg pain.  ?

## 2021-10-01 NOTE — ED Notes (Signed)
Patient states that he needs additional pain medications. Dr. Quentin Cornwall notified via secure chat. Awaiting new orders. ?

## 2021-10-01 NOTE — ED Triage Notes (Signed)
Patient arrived to the room calm but when he moved to the stretcher he started screaming out in pain to his lower legs. He refuses to sit in the stretcher and is uncooperative. He was placed in the chair which he was happy with but continued to scream. ?

## 2021-10-01 NOTE — ED Provider Notes (Signed)
? ?[facility] ?Provider Note ? ? ? Event Date/Time  ? First MD Initiated Contact with Patient 10/01/21 2106   ?  (approximate) ? ? ?History  ? ?Leg Pain ? ? ?HPI ? ?[name] is a 66 y.o. male presents to the ER for evaluation of acutely worsening right leg pain swelling drainage.  States has been warm to touch and having increasing drainage from that area.  He is dialysis patient states he got dialysis today.  States that dressings were changed today as well.  Not been on any antibiotics.  States the pain is moderate to severe.  Has had chills. ?  ? ? ?Physical Exam  ? ?Triage Vital Signs: ?ED Triage Vitals  ?Enc Vitals Group  ?   BP 10/01/21 2102 (!) 156/92  ?   Pulse Rate 10/01/21 2102 (!) 116  ?   Resp 10/01/21 2102 20  ?   Temp 10/01/21 2102 98.3 ?F (36.8 ?C)  ?   Temp Source 10/01/21 2102 Oral  ?   SpO2 10/01/21 2102 93 %  ?   Weight 10/01/21 2106 270 lb (122.5 kg)  ?   Height 10/01/21 2106 6' (1.829 m)  ?   Head Circumference --   ?   Peak Flow --   ?   Pain Score 10/01/21 2104 10  ?   Pain Loc --   ?   Pain Edu? --   ?   Excl. in [place]? --   ? ? ?Most recent vital signs: ?Vitals:  ? 10/01/21 2102  ?BP: (!) 156/92  ?Pulse: (!) 116  ?Resp: 20  ?Temp: 98.3 ?F (36.8 ?C)  ?SpO2: 93%  ? ? ? ?Constitutional: Alert  ?Eyes: Conjunctivae are normal.  ?Head: Atraumatic. ?Nose: No congestion/rhinnorhea. ?Mouth/Throat: Mucous membranes are moist.   ?Neck: Painless ROM.  ?Cardiovascular:   Good peripheral circulation. ?Respiratory: Normal respiratory effort.  No retractions.  ?Gastrointestinal: Soft and nontender.  ?Musculoskeletal: Chronic ulcerative changes to particular right lower extremity now with purulent discharge overlying warmth and swelling.  No fluctuance. ?Neurologic:  MAE spontaneously. No gross focal neurologic deficits are appreciated.  ?Skin:  Skin is warm, dry and intact. No rash noted. ?Psychiatric: Mood and affect are normal. Speech and behavior are normal. ? ? ? ?ED Results  / Procedures / Treatments  ? ?Labs ?(all labs ordered are listed, but only abnormal results are displayed) ?Labs Reviewed  ?CBC - Abnormal; Notable for the following components:  ?    Result Value  ? RBC 2.90 (*)   ? Hemoglobin 8.0 (*)   ? HCT 24.8 (*)   ? RDW 16.4 (*)   ? All other components within normal limits  ?COMPREHENSIVE METABOLIC PANEL - Abnormal; Notable for the following components:  ? Chloride 96 (*)   ? BUN 45 (*)   ? Creatinine, Ser 6.79 (*)   ? Calcium 8.4 (*)   ? Total Protein 8.2 (*)   ? Albumin 2.8 (*)   ? Alkaline Phosphatase 173 (*)   ? GFR, Estimated 8 (*)   ? All other components within normal limits  ?RESP PANEL BY RT-PCR (FLU A&B, COVID) [id number]  ?LACTIC ACID, PLASMA  ?LACTIC ACID, PLASMA  ? ? ? ?EKG ? ? ? ? ?RADIOLOGY ?Please see ED Course for my review and interpretation. ? ?I personally reviewed all radiographic images ordered to evaluate for the above acute complaints and reviewed radiology reports and findings.  These findings were personally discussed with the patient.  Please see medical record for radiology report. ? ? ? ?PROCEDURES: ? ?Critical Care performed: No ? ?Procedures ? ? ?MEDICATIONS ORDERED IN ED: ?Medications  ?metroNIDAZOLE (FLAGYL) IVPB 500 mg (500 mg Intravenous New Bag/Given 10/01/21 2218)  ?vancomycin (VANCOCIN) IVPB 1000 mg/200 mL premix (1,000 mg Intravenous New Bag/Given 10/01/21 2217)  ?vancomycin (VANCOCIN) IVPB 1000 mg/200 mL premix (has no administration in time range)  ?ceFEPIme (MAXIPIME) 2 g in sodium chloride 0.9 % 100 mL IVPB (0 g Intravenous Stopped 10/01/21 2214)  ?oxyCODONE-acetaminophen (PERCOCET/ROXICET) 5-325 MG per tablet 1 tablet (1 tablet Oral Given 10/01/21 2137)  ? ? ? ?IMPRESSION / MDM / ASSESSMENT AND PLAN / ED COURSE  ?I reviewed the triage vital signs and the nursing notes. ?             ?               ? ?Differential diagnosis includes, but is not limited to, cellulitis, NSTI, ulcerations, DVT, sepsis, diabetic ulcer ? ?Patient presents with  acutely worsening right lower leg pain redness purulent discharge related to ulceration wound of the right leg.  He is afebrile does appear uncomfortable mildly tachycardic to get dialyzed today.  Does have some surrounding erythema and cellulitis with very foul-smelling ulceration and wound of the right leg.  Comparing to images from January has had profound worsening and on review of notes from the [place] it seems like today's presentation is acutely worse over the past few weeks.  We will order broad-spectrum antibiotics.  Will require admission to the hospital. ? ?Clinical Course as of 10/01/21 2315  ?Fri Oct 01, 2021  ?2315 Case discussed in consultation with hospitalist who agreed admit patient to their service. [PR]  ?  ?Clinical Course User Index ?[PR] Merlyn Lot, MD  ? ? ? ?FINAL CLINICAL IMPRESSION(S) / ED DIAGNOSES  ? ?Final diagnoses:  ?Right leg pain  ?Cellulitis of right lower extremity  ? ? ? ?Rx / DC Orders  ? ?ED Discharge Orders   ? ? None  ? ?  ? ? ? ?Note:  This document was prepared using Dragon voice recognition software and may include unintentional dictation errors. ? ?  ?Merlyn Lot, MD ?10/01/21 2316 ? ?

## 2021-10-02 ENCOUNTER — Encounter: Payer: Self-pay | Admitting: Internal Medicine

## 2021-10-02 DIAGNOSIS — L03115 Cellulitis of right lower limb: Secondary | ICD-10-CM | POA: Diagnosis present

## 2021-10-02 DIAGNOSIS — R338 Other retention of urine: Secondary | ICD-10-CM | POA: Diagnosis present

## 2021-10-02 DIAGNOSIS — E785 Hyperlipidemia, unspecified: Secondary | ICD-10-CM | POA: Diagnosis present

## 2021-10-02 DIAGNOSIS — D638 Anemia in other chronic diseases classified elsewhere: Secondary | ICD-10-CM

## 2021-10-02 DIAGNOSIS — Z992 Dependence on renal dialysis: Secondary | ICD-10-CM | POA: Diagnosis not present

## 2021-10-02 DIAGNOSIS — K5900 Constipation, unspecified: Secondary | ICD-10-CM | POA: Diagnosis present

## 2021-10-02 DIAGNOSIS — N401 Enlarged prostate with lower urinary tract symptoms: Secondary | ICD-10-CM | POA: Diagnosis present

## 2021-10-02 DIAGNOSIS — N2581 Secondary hyperparathyroidism of renal origin: Secondary | ICD-10-CM | POA: Diagnosis present

## 2021-10-02 DIAGNOSIS — S81809A Unspecified open wound, unspecified lower leg, initial encounter: Secondary | ICD-10-CM | POA: Diagnosis not present

## 2021-10-02 DIAGNOSIS — E669 Obesity, unspecified: Secondary | ICD-10-CM | POA: Diagnosis present

## 2021-10-02 DIAGNOSIS — S81802A Unspecified open wound, left lower leg, initial encounter: Secondary | ICD-10-CM | POA: Diagnosis not present

## 2021-10-02 DIAGNOSIS — N186 End stage renal disease: Secondary | ICD-10-CM | POA: Diagnosis present

## 2021-10-02 DIAGNOSIS — L97228 Non-pressure chronic ulcer of left calf with other specified severity: Secondary | ICD-10-CM | POA: Diagnosis present

## 2021-10-02 DIAGNOSIS — K219 Gastro-esophageal reflux disease without esophagitis: Secondary | ICD-10-CM | POA: Diagnosis present

## 2021-10-02 DIAGNOSIS — G4733 Obstructive sleep apnea (adult) (pediatric): Secondary | ICD-10-CM | POA: Diagnosis present

## 2021-10-02 DIAGNOSIS — I2510 Atherosclerotic heart disease of native coronary artery without angina pectoris: Secondary | ICD-10-CM | POA: Diagnosis present

## 2021-10-02 DIAGNOSIS — M79604 Pain in right leg: Secondary | ICD-10-CM | POA: Diagnosis present

## 2021-10-02 DIAGNOSIS — Z7901 Long term (current) use of anticoagulants: Secondary | ICD-10-CM | POA: Diagnosis not present

## 2021-10-02 DIAGNOSIS — I878 Other specified disorders of veins: Secondary | ICD-10-CM | POA: Diagnosis present

## 2021-10-02 DIAGNOSIS — Z8616 Personal history of COVID-19: Secondary | ICD-10-CM | POA: Diagnosis not present

## 2021-10-02 DIAGNOSIS — I480 Paroxysmal atrial fibrillation: Secondary | ICD-10-CM | POA: Diagnosis present

## 2021-10-02 DIAGNOSIS — I872 Venous insufficiency (chronic) (peripheral): Secondary | ICD-10-CM | POA: Diagnosis not present

## 2021-10-02 DIAGNOSIS — D631 Anemia in chronic kidney disease: Secondary | ICD-10-CM | POA: Diagnosis present

## 2021-10-02 DIAGNOSIS — L97218 Non-pressure chronic ulcer of right calf with other specified severity: Secondary | ICD-10-CM | POA: Diagnosis present

## 2021-10-02 DIAGNOSIS — F17210 Nicotine dependence, cigarettes, uncomplicated: Secondary | ICD-10-CM | POA: Diagnosis present

## 2021-10-02 DIAGNOSIS — S81801A Unspecified open wound, right lower leg, initial encounter: Secondary | ICD-10-CM | POA: Diagnosis not present

## 2021-10-02 DIAGNOSIS — I120 Hypertensive chronic kidney disease with stage 5 chronic kidney disease or end stage renal disease: Secondary | ICD-10-CM | POA: Diagnosis present

## 2021-10-02 DIAGNOSIS — Z6837 Body mass index (BMI) 37.0-37.9, adult: Secondary | ICD-10-CM | POA: Diagnosis not present

## 2021-10-02 DIAGNOSIS — G894 Chronic pain syndrome: Secondary | ICD-10-CM | POA: Diagnosis present

## 2021-10-02 DIAGNOSIS — L03116 Cellulitis of left lower limb: Secondary | ICD-10-CM | POA: Diagnosis present

## 2021-10-02 LAB — BASIC METABOLIC PANEL
Anion gap: 13 (ref 5–15)
BUN: 47 mg/dL — ABNORMAL HIGH (ref 8–23)
CO2: 26 mmol/L (ref 22–32)
Calcium: 8.5 mg/dL — ABNORMAL LOW (ref 8.9–10.3)
Chloride: 95 mmol/L — ABNORMAL LOW (ref 98–111)
Creatinine, Ser: 7.21 mg/dL — ABNORMAL HIGH (ref 0.61–1.24)
GFR, Estimated: 8 mL/min — ABNORMAL LOW (ref >60)
Glucose, Bld: 118 mg/dL — ABNORMAL HIGH (ref 70–99)
Potassium: 4.5 mmol/L (ref 3.5–5.1)
Sodium: 134 mmol/L — ABNORMAL LOW (ref 135–145)

## 2021-10-02 LAB — CBC
HCT: 26.1 % — ABNORMAL LOW (ref 39.0–52.0)
Hemoglobin: 8.6 g/dL — ABNORMAL LOW (ref 13.0–17.0)
MCH: 28 pg (ref 26.0–34.0)
MCHC: 33 g/dL (ref 30.0–36.0)
MCV: 85 fL (ref 80.0–100.0)
Platelets: 151 10*3/uL (ref 150–400)
RBC: 3.07 MIL/uL — ABNORMAL LOW (ref 4.22–5.81)
RDW: 16.5 % — ABNORMAL HIGH (ref 11.5–15.5)
WBC: 7.2 10*3/uL (ref 4.0–10.5)
nRBC: 0 % (ref 0.0–0.2)

## 2021-10-02 LAB — MRSA NEXT GEN BY PCR, NASAL: MRSA by PCR Next Gen: DETECTED — AB

## 2021-10-02 LAB — APTT: aPTT: 48 seconds — ABNORMAL HIGH (ref 24–36)

## 2021-10-02 LAB — LACTIC ACID, PLASMA: Lactic Acid, Venous: 0.9 mmol/L (ref 0.5–1.9)

## 2021-10-02 MED ORDER — TAMSULOSIN HCL 0.4 MG PO CAPS
0.4000 mg | ORAL_CAPSULE | Freq: Every day | ORAL | Status: DC
  Administered 2021-10-02 – 2021-10-14 (×13): 0.4 mg via ORAL
  Filled 2021-10-02 (×13): qty 1

## 2021-10-02 MED ORDER — CHLORHEXIDINE GLUCONATE CLOTH 2 % EX PADS
6.0000 | MEDICATED_PAD | Freq: Every day | CUTANEOUS | Status: AC
  Administered 2021-10-03 – 2021-10-04 (×2): 6 via TOPICAL

## 2021-10-02 MED ORDER — OXYCODONE HCL ER 10 MG PO T12A
20.0000 mg | EXTENDED_RELEASE_TABLET | Freq: Two times a day (BID) | ORAL | Status: DC
  Administered 2021-10-02 – 2021-10-14 (×24): 20 mg via ORAL
  Filled 2021-10-02 (×25): qty 2

## 2021-10-02 MED ORDER — LABETALOL HCL 5 MG/ML IV SOLN: 5.0000 mg | INTRAVENOUS | Status: DC | PRN

## 2021-10-02 MED ORDER — APIXABAN 5 MG PO TABS
5.0000 mg | ORAL_TABLET | Freq: Two times a day (BID) | ORAL | Status: DC
  Administered 2021-10-02 (×2): 5 mg via ORAL
  Filled 2021-10-02 (×2): qty 1

## 2021-10-02 MED ORDER — HEPARIN (PORCINE) 25000 UT/250ML-% IV SOLN
1750.0000 [IU]/h | INTRAVENOUS | Status: AC
Start: 2021-10-02 — End: 2021-10-07
  Administered 2021-10-03: 1750 [IU]/h via INTRAVENOUS
  Administered 2021-10-03: 1550 [IU]/h via INTRAVENOUS
  Administered 2021-10-04 – 2021-10-06 (×5): 1750 [IU]/h via INTRAVENOUS
  Filled 2021-10-02 (×7): qty 250

## 2021-10-02 MED ORDER — OXYCODONE HCL 5 MG PO TABS
5.0000 mg | ORAL_TABLET | Freq: Four times a day (QID) | ORAL | Status: DC | PRN
  Administered 2021-10-02 – 2021-10-13 (×22): 5 mg via ORAL
  Filled 2021-10-02 (×23): qty 1

## 2021-10-02 MED ORDER — HYDROMORPHONE HCL 1 MG/ML IJ SOLN
1.0000 mg | INTRAMUSCULAR | Status: DC | PRN
  Administered 2021-10-03 – 2021-10-07 (×13): 1 mg via INTRAVENOUS
  Filled 2021-10-02 (×14): qty 1

## 2021-10-02 MED ORDER — SODIUM CHLORIDE 0.9 % IV SOLN
INTRAVENOUS | Status: DC | PRN
Start: 2021-10-02 — End: 2021-10-14

## 2021-10-02 MED ORDER — HYDROMORPHONE HCL 1 MG/ML IJ SOLN: 1.0000 mg | INTRAMUSCULAR | Status: DC | PRN

## 2021-10-02 MED ORDER — MUPIROCIN 2 % EX OINT
1.0000 "application " | TOPICAL_OINTMENT | Freq: Two times a day (BID) | CUTANEOUS | Status: AC
  Administered 2021-10-02 – 2021-10-06 (×10): 1 via NASAL
  Filled 2021-10-02: qty 22

## 2021-10-02 NOTE — Assessment & Plan Note (Addendum)
-   nephrology following. Pt undergoes MWF HD ?- Cont with scheduled HD as tolerated ?

## 2021-10-02 NOTE — ED Notes (Signed)
Patient continues to call out and c/o ongoing pain in his lower legs despite having received two different pain medications. He states that it is not working at all but states it has dropped one point from his original pain level. Patient wants more but I have explained that he will have to wait until it is due again. ?

## 2021-10-02 NOTE — Assessment & Plan Note (Addendum)
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 Eliquis now held in anticipation for procedure this week ?

## 2021-10-02 NOTE — Progress Notes (Signed)
[facility] ?[place], [place] ?10/02/21 ? ?Subjective:  ? ?Hospital day # 0 ? ?Patient known to our practice from previous admissions  ?This time patient presents for right lower extremity pain from the wound. ?Treated with broad-spectrum antibiotics including cefepime, metronidazole and vancomycin in the emergency room. ?Admitted for wound evaluation by surgical services and IV antibiotics. ?States he went for his regular dialysis on Friday. ?Reports pain in the legs from the wounds with this morning. ? ? ?Objective:  ?Vital signs in last 24 hours:  ?Temp:  [97.8 ?F (36.6 ?C)-98.3 ?F (36.8 ?C)] 97.8 ?F (36.6 ?C) (03/25 8242) ?Pulse Rate:  [73-116] 77 (03/25 0811) ?Resp:  [18-20] 18 (03/25 3536) ?BP: (125-156)/(76-94) 130/87 (03/25 1443) ?SpO2:  [93 %-100 %] 98 % (03/25 0811) ?Weight:  [122.5 kg] 122.5 kg (03/24 2106) ? ?Weight change:  ?Filed Weights  ? 10/01/21 2106  ?Weight: 122.5 kg  ? ? ?Intake/Output: ?  ? ?Intake/Output Summary (Last 24 hours) at 10/02/2021 0858 ?Last data filed at 10/02/2021 0300 ?Gross per 24 hour  ?Intake 838.25 ml  ?Output --  ?Net 838.25 ml  ? ? ? ?Physical Exam: ?General: Chronically ill-appearing gentleman, sitting up in chair  ?HEENT Anicteric, moist oral mucous membranes  ?Pulm/lungs Normal breathing effort on room air  ?CVS/Heart No rub or gallop  ?Abdomen:  Soft  ?Extremities: 2-3+ massive pitting edema bilateral lower extremities  ?Neurologic: Alert oriented  ?Skin: skin changes from chronic edema bilaterally  ?Access: Left upper arm AV fistula, good thrill  ?   ? ? ?Basic Metabolic Panel: ? ?Recent Labs  ?Lab 10/01/21 ?2125 10/02/21 ?0212  ?NA 136 134*  ?K 4.5 4.5  ?CL 96* 95*  ?CO2 27 26  ?GLUCOSE 89 118*  ?BUN 45* 47*  ?CREATININE 6.79* 7.21*  ?CALCIUM 8.4* 8.5*  ? ? ? ?CBC: ?Recent Labs  ?Lab 10/01/21 ?2125 10/02/21 ?0212  ?WBC 8.0 7.2  ?HGB 8.0* 8.6*  ?HCT 24.8* 26.1*  ?MCV 85.5 85.0  ?PLT 161 151  ? ? ?  ?Lab Results  ?Component Value Date  ? HEPBSAG NON  REACTIVE 06/16/2021  ? HEPBSAB Reactive (A) 06/16/2021  ? HEPBIGM Negative 11/15/2018  ? ? ? ? ?Microbiology: ? ?Recent Results (from the past 240 hour(s))  ?Resp Panel by RT-PCR (Flu A&B, Covid) Nasopharyngeal Swab     Status: None  ? Collection Time: 10/01/21  9:41 PM  ? Specimen: Nasopharyngeal Swab; Nasopharyngeal(NP) swabs in vial transport medium  ?Result Value Ref Range Status  ? SARS Coronavirus 2 by RT PCR NEGATIVE NEGATIVE Final  ?  Comment: (NOTE) ?SARS-CoV-2 target nucleic acids are NOT DETECTED. ? ?The SARS-CoV-2 RNA is generally detectable in upper respiratory ?specimens during the acute phase of infection. The lowest ?concentration of SARS-CoV-2 viral copies this assay can detect is ?138 copies/mL. A negative result does not preclude SARS-Cov-2 ?infection and should not be used as the sole basis for treatment or ?other patient management decisions. A negative result may occur with  ?improper specimen collection/handling, submission of specimen other ?than nasopharyngeal swab, presence of viral mutation(s) within the ?areas targeted by this assay, and inadequate number of viral ?copies(<138 copies/mL). A negative result must be combined with ?clinical observations, patient history, and epidemiological ?information. The expected result is Negative. ? ?Fact Sheet for Patients:  ?EntrepreneurPulse.com.au ? ?Fact Sheet for Healthcare Providers:  ?IncredibleEmployment.be ? ?This test is no t yet approved or cleared by the [place] FDA and  ?has been authorized for detection and/or diagnosis of SARS-CoV-2  by ?FDA under an Emergency Use Authorization (EUA). This EUA will remain  ?in effect (meaning this test can be used) for the duration of the ?COVID-19 declaration under Section 564(b)(1) of the Act, 21 ?U.S.C.section 360bbb-3(b)(1), unless the authorization is terminated  ?or revoked sooner.  ? ? ?  ? Influenza A by PCR NEGATIVE NEGATIVE Final  ? Influenza B by PCR  NEGATIVE NEGATIVE Final  ?  Comment: (NOTE) ?The Xpert Xpress SARS-CoV-2/FLU/RSV plus assay is intended as an aid ?in the diagnosis of influenza from Nasopharyngeal swab specimens and ?should not be used as a sole basis for treatment. Nasal washings and ?aspirates are unacceptable for Xpert Xpress SARS-CoV-2/FLU/RSV ?testing. ? ?Fact Sheet for Patients: ?EntrepreneurPulse.com.au ? ?Fact Sheet for Healthcare Providers: ?IncredibleEmployment.be ? ?This test is not yet approved or cleared by the [place] FDA and ?has been authorized for detection and/or diagnosis of SARS-CoV-2 by ?FDA under an Emergency Use Authorization (EUA). This EUA will remain ?in effect (meaning this test can be used) for the duration of the ?COVID-19 declaration under Section 564(b)(1) of the Act, 21 U.S.C. ?section 360bbb-3(b)(1), unless the authorization is terminated or ?revoked. ? ?Performed at [facility], [place], ?[place] [address] ?  ?MRSA Next Gen by PCR, Nasal     Status: Abnormal  ? Collection Time: 10/02/21  2:55 AM  ? Specimen: Nasal Mucosa; Nasal Swab  ?Result Value Ref Range Status  ? MRSA by PCR Next Gen DETECTED (A) NOT DETECTED Final  ?  Comment: RESULT CALLED TO, READ BACK BY AND VERIFIED WITH: ?MELISSA COBB AT 0422 10/02/21.PMF ?(NOTE) ?The GeneXpert MRSA Assay (FDA approved for NASAL specimens only), ?is one component of a comprehensive MRSA colonization surveillance ?program. It is not intended to diagnose MRSA infection nor to guide ?or monitor treatment for MRSA infections. ?Test performance is not FDA approved in patients less than 2 years ?old. ?Performed at [facility], [place], ?[place] [address] ?  ? ? ?Coagulation Studies: ?No results for input(s): LABPROT, INR in the last 72 hours. ? ?Urinalysis: ?No results for input(s): COLORURINE, LABSPEC, [place], GLUCOSEU, HGBUR, BILIRUBINUR, KETONESUR, PROTEINUR, UROBILINOGEN,  NITRITE, LEUKOCYTESUR in the last 72 hours. ? ?Invalid input(s): APPERANCEUR  ? ? ?Imaging: ?DG Tibia/Fibula Right ? ?Result Date: 10/01/2021 ?CLINICAL DATA:  Right lower extremity pain. Evaluate for soft tissue gas. EXAM: RIGHT TIBIA AND FIBULA - 2 VIEW COMPARISON:  None. FINDINGS: There is no acute fracture or dislocation. Mild arthritic changes of the right knee. There is diffuse subcutaneous edema. No radiopaque foreign object or soft tissue gas. Skin ulceration of the distal leg. IMPRESSION: 1. No acute fracture or dislocation. 2. Diffuse subcutaneous edema. Skin ulceration of the distal leg. No radiopaque foreign object or soft tissue gas. Electronically Signed   By: Anner Crete M.D.   On: 10/01/2021 21:44  ? ?DG Chest Port 1 View ? ?Result Date: 10/01/2021 ?CLINICAL DATA:  Sepsis EXAM: PORTABLE CHEST 1 VIEW COMPARISON:  08/07/2021 FINDINGS: Two frontal views of the chest demonstrates stable enlargement of the cardiac silhouette. There is chronic central vascular congestion without acute airspace disease, effusion, or pneumothorax. No acute bony abnormalities. IMPRESSION: 1. Chronic central vascular congestion without overt edema. Electronically Signed   By: Randa Ngo M.D.   On: 10/01/2021 21:44   ? ? ?Medications:  ? ? sodium chloride    ? ceFEPime (MAXIPIME) IV    ? metronidazole    ? vancomycin    ? ? apixaban  5 mg Oral BID  ? atorvastatin  40 mg Oral QHS  ? bumetanide  1 mg Oral 2 times per day on Sun Tue Thu Sat  ? carvedilol  6.25 mg Oral BID  ? Chlorhexidine Gluconate Cloth  6 each Topical Q0600  ? mupirocin ointment  1 application. Nasal BID  ? pantoprazole  80 mg Oral Daily  ? pregabalin  25 mg Oral QHS  ? tamsulosin  0.4 mg Oral Daily  ? ?sodium chloride, albuterol, HYDROmorphone (DILAUDID) injection, labetalol, morphine injection, polyethylene glycol ? ?Assessment/ Plan:  ?66 y.o. male with end-stage renal disease, hypertension, atrial fibrillation, coronary artery disease, pulmonary  embolism, hyperlipidemia, polysubstance abuse, history of chronic pain syndrome ?Admission at [facility] for acute respiratory failure, COVID-19  in January 2023 ?This time he is admitted on 10/01/2021 for ?Right leg pain [M7

## 2021-10-02 NOTE — Assessment & Plan Note (Addendum)
-   Resumed home carvedilol 6.25 mg p.o. twice daily ?- Resumed home Bumex 1 mg, p.o., On nonhemodialysis today  ?-BP stable and controlled ?- Labetalol injection 5 mg IV every 3 hours.  For SBP greater than 180, 3 doses ordered ?

## 2021-10-02 NOTE — Progress Notes (Signed)
Patient positive for MRSA. See new order entered. ? ?

## 2021-10-02 NOTE — ED Notes (Signed)
Request made for transport to the floor ?

## 2021-10-02 NOTE — Consult Note (Signed)
[facility] ? ?ASSESSMENT / PLAN: ?66 y.o. male with severe bilateral lower extremity venous stasis ulceration. Needs compression, elevation, local wound care. These wounds would likely benefit from debridement and aggressive, multilayer compression therapy (unna boot therapy) to accelerate healing. Hold Eliquis to allow safe debridement early this week. Transition to heparin. Will check ABI / toe pressures to evaluate for peripheral arterial disease. An angiogram may be helpful if ABI/TP not feasible or diagnostic. Will follow up on these results.  ? ?CHIEF COMPLAINT: bilateral lower extremity ankle wounds. ? ?HISTORY OF PRESENT ILLNESS: ?[name] is a 66 y.o. male admitted to the hospitalist service with bilateral lower extremity wounds about the ankle. His history is significant for ESRD on HD, AF on Eliquis, GERD, BPH, HTN, HLA, non-compliance, tobacco use. He presents to [facility] for evaluation of painful, worsening, bilateral lower extremity wounds. The wounds are about the ankles. He reports they have been present over the past 8 months.  ? ?VASCULAR SURGICAL HISTORY: none ? ?VASCULAR RISK FACTORS: ?Negative history of stroke / transient ischemic attack. ?Positive history of coronary artery disease.  ?Negative history of diabetes mellitus.  ?Positive history of smoking. + actively smoking. ?Positive history of hypertension.  ?Positive history of chronic kidney disease. ESRD. ?Negative history of chronic obstructive pulmonary disease ? ?FUNCTIONAL STATUS: ?ECOG performance status: (3) Capable of limited self-care, confined to bed or chair > 50% of waking hours ?Ambulatory status: Minimally ambulatory (e.g. about the home only) ? ?Past Medical History:  ?Diagnosis Date  ? Depression   ? Diastolic dysfunction   ? a. 09/2019 Echo: EF 55-60%, no rwma, mod LVH, gr2 DD. Nl RV size/fxn. Mildly dil LA. Ao root 4.5cm.  ? Dilated aortic root ([place])   ? a. 09/2019 Echo: Ao root 4.5cm.  ?  Elevated troponin level not due myocardial infarction   ? ESRD (end stage renal disease) ([place])   ? Hypertension   ? Nonobstructive Coronary Artery Disease   ? a. 09/2019 Cath: LM nl, LAD min irregs, D1 nl, D2 min irregs, LCX large, min irregs, RCA large, 20p, 30p/m.  ? PE (pulmonary embolism)   ? Renal insufficiency   ? ? ?Past Surgical History:  ?Procedure Laterality Date  ? LEFT HEART CATH AND CORONARY ANGIOGRAPHY N/A 09/30/2019  ? Procedure: LEFT HEART CATH AND CORONARY ANGIOGRAPHY;  Surgeon: Wellington Hampshire, MD;  Location: [facility];  Service: Cardiovascular;  Laterality: N/A;  ? TOTAL HIP ARTHROPLASTY    ? ? ?Family History  ?Problem Relation Age of Onset  ? Hypertension Mother   ? ? ?Social History  ? ?Socioeconomic History  ? Marital status: Divorced  ?  Spouse name: Not on file  ? Number of children: Not on file  ? Years of education: Not on file  ? Highest education level: Not on file  ?Occupational History  ? Not on file  ?Tobacco Use  ? Smoking status: Every Day  ?  Packs/day: 0.50  ?  Types: Cigarettes  ? Smokeless tobacco: Never  ?Substance and Sexual Activity  ? Alcohol use: No  ? Drug use: No  ? Sexual activity: Not Currently  ?Other Topics Concern  ? Not on file  ?Social History Narrative  ? Not on file  ? ?Social Determinants of Health  ? ?Financial Resource Strain: Not on file  ?Food Insecurity: Not on file  ?Transportation Needs: Not on file  ?Physical Activity: Not on file  ?Stress: Not on file  ?Social Connections:  Not on file  ?Intimate Partner Violence: Not on file  ? ? ?Allergies  ?Allergen Reactions  ? Cyclobenzaprine Nausea Only  ? Amlodipine   ? Clonidine Other (See Comments)  ?  Asymptomatic bradycardia to 38  ? Furosemide Other (See Comments)  ?  Caused renal failure  ? Tylenol [Acetaminophen] Other (See Comments)  ?  Reaction:  Pt states it bothers his liver  ? ? ?Current Facility-Administered Medications  ?Medication Dose Route Frequency Provider Last Rate Last Admin  ? 0.9  %  sodium chloride infusion   Intravenous PRN Donne Hazel, MD 10 mL/hr at 10/02/21 0916 New Bag at 10/02/21 0916  ? albuterol (PROVENTIL) (2.5 MG/3ML) 0.083% nebulizer solution 2.5 mg  2.5 mg Nebulization Q6H PRN Cox, Amy N, DO      ? apixaban (ELIQUIS) tablet 5 mg  5 mg Oral BID Cox, Amy N, DO   5 mg at 10/02/21 6415  ? atorvastatin (LIPITOR) tablet 40 mg  40 mg Oral QHS Cox, Amy N, DO   40 mg at 10/02/21 0136  ? bumetanide (BUMEX) tablet 1 mg  1 mg Oral 2 times per day on Sun Tue Thu Sat Cox, Amy N, DO   1 mg at 10/02/21 8309  ? carvedilol (COREG) tablet 6.25 mg  6.25 mg Oral BID Cox, Amy N, DO   6.25 mg at 10/02/21 4076  ? ceFEPIme (MAXIPIME) 1 g in sodium chloride 0.9 % 100 mL IVPB  1 g Intravenous Q24H Cox, Amy N, DO      ? Chlorhexidine Gluconate Cloth 2 % PADS 6 each  6 each Topical Q0600 Sharion Settler, NP      ? HYDROmorphone (DILAUDID) injection 1 mg  1 mg Intravenous Q3H PRN Cox, Amy N, DO   1 mg at 10/02/21 0758  ? labetalol (NORMODYNE) injection 5 mg  5 mg Intravenous Q3H PRN Cox, Amy N, DO      ? metroNIDAZOLE (FLAGYL) IVPB 500 mg  500 mg Intravenous Q12H Cox, Amy N, DO 100 mL/hr at 10/02/21 0924 500 mg at 10/02/21 8088  ? morphine (PF) 4 MG/ML injection 4 mg  4 mg Intravenous Q3H PRN Cox, Amy N, DO   4 mg at 10/02/21 0920  ? mupirocin ointment (BACTROBAN) 2 % 1 application.  1 application. Nasal BID Sharion Settler, NP      ? pantoprazole (PROTONIX) EC tablet 80 mg  80 mg Oral Daily Cox, Amy N, DO   80 mg at 10/02/21 1103  ? polyethylene glycol (MIRALAX / GLYCOLAX) packet 17 g  17 g Oral Daily PRN Cox, Amy N, DO      ? pregabalin (LYRICA) capsule 25 mg  25 mg Oral QHS Cox, Amy N, DO   25 mg at 10/02/21 0139  ? tamsulosin (FLOMAX) capsule 0.4 mg  0.4 mg Oral Daily Cox, Amy N, DO   0.4 mg at 10/02/21 1594  ? vancomycin (VANCOCIN) IVPB 1000 mg/200 mL premix  1,000 mg Intravenous Q M,W,F-HD Cox, Amy N, DO      ? ? ?PHYSICAL EXAM ?Vitals:  ? 10/02/21 0000 10/02/21 0145 10/02/21 0522 10/02/21 5859   ?BP: (!) 143/94 139/86 125/84 130/87  ?Pulse: 82 87 73 77  ?Resp: 20 18 18 18   ?Temp: 98.1 ?F (36.7 ?C) 98 ?F (36.7 ?C) 97.9 ?F (36.6 ?C) 97.8 ?F (36.6 ?C)  ?TempSrc: Oral   Oral  ?SpO2: 99% 100% 100% 98%  ?Weight:      ?Height:      ? ? ?  Constitutional: Chronically ill appearing much older than stated age. No distress. Appears well nourished.  ?Neurologic: CN intact. no focal findings. no sensory loss. ?Psychiatric:  Mood and affect symmetric and appropriate. ?Eyes:  No icterus. No conjunctival pallor. ?Ears, nose, throat:  mucous membranes moist. Midline trachea.  ?Cardiac: regular rate and rhythm.  ?Respiratory:  unlabored. ?Abdominal:  soft, non-tender, non-distended.  ?Peripheral vascular: LUE AVF. Bilateral lower extremity edema from toes to thighs. Severe venous stasis ulceration with maceration and eschar. Active weeping. Bilateral feet wet from serous drainage from wounds. No palpable pedal pulses ?Extremity: profound edema as above. no cyanosis. no pallor.  ?Skin:  ? ? ? ? ? ? ?Lymphatic: no Stemmer's sign. no palpable lymphadenopathy. ? ?PERTINENT LABORATORY AND RADIOLOGIC DATA ? ?Most recent CBC ? ?  Latest Ref Rng & Units 10/02/2021  ?  2:12 AM 10/01/2021  ?  9:25 PM 08/08/2021  ?  7:13 AM  ?CBC  ?WBC 4.0 - 10.5 K/uL 7.2   8.0   5.3    ?Hemoglobin 13.0 - 17.0 g/dL 8.6   8.0   9.0    ?Hematocrit 39.0 - 52.0 % 26.1   24.8   27.8    ?Platelets 150 - 400 K/uL 151   161   90    ?  ? ?Most recent CMP ? ?  Latest Ref Rng & Units 10/02/2021  ?  2:12 AM 10/01/2021  ?  9:25 PM 08/08/2021  ?  7:13 AM  ?CMP  ?Glucose 70 - 99 mg/dL 118   89   89    ?BUN 8 - 23 mg/dL 47   45   55    ?Creatinine 0.61 - 1.24 mg/dL 7.21   6.79   7.21    ?Sodium 135 - 145 mmol/L 134   136   137    ?Potassium 3.5 - 5.1 mmol/L 4.5   4.5   5.3    ?Chloride 98 - 111 mmol/L 95   96   98    ?CO2 22 - 32 mmol/L 26   27   26     ?Calcium 8.9 - 10.3 mg/dL 8.5   8.4   8.9    ?Total Protein 6.5 - 8.1 g/dL  8.2     ?Total Bilirubin 0.3 - 1.2 mg/dL  0.9      ?Alkaline Phos 38 - 126 U/L  173     ?AST 15 - 41 U/L  31     ?ALT 0 - 44 U/L  38     ? ? ?Renal function ?Estimated Creatinine Clearance: 14.2 mL/min (A) (by C-G formula based on SCr of 7.21 mg/dL (H)).

## 2021-10-02 NOTE — Assessment & Plan Note (Addendum)
-   Eliquis changed to heparin gtt in anticipation for upcoming wound debridement ?

## 2021-10-02 NOTE — ED Notes (Signed)
Per Solmon Ice, RN, patient is ok to come to floor. Unit clerk is placing transport order in the computer. ?

## 2021-10-02 NOTE — Assessment & Plan Note (Addendum)
-  Vascular Surgery following - recommendation to hold further eliquis in anticipation for debridement this week ?-Continue heparin gtt ?

## 2021-10-02 NOTE — Consult Note (Signed)
ANTICOAGULATION CONSULT NOTE - Initial Consult ? ?Pharmacy Consult for Heparin infusion ?Indication: atrial fibrillation; hx of PE on eliquis ? ?Allergies  ?Allergen Reactions  ? Cyclobenzaprine Nausea Only  ? Amlodipine   ? Clonidine Other (See Comments)  ?  Asymptomatic bradycardia to 38  ? Furosemide Other (See Comments)  ?  Caused renal failure  ? Tylenol [Acetaminophen] Other (See Comments)  ?  Reaction:  Pt states it bothers his liver  ? ? ?Patient Measurements: ?Height: 6' (182.9 cm) ?Weight: 122.5 kg (270 lb) ?IBW/kg (Calculated) : 77.6 ?Heparin Dosing Weight: 104.6 kg  ? ?Vital Signs: ?Temp: 97.7 ?F (36.5 ?C) (03/25 1121) ?Temp Source: Oral (03/25 1121) ?BP: 139/77 (03/25 1121) ?Pulse Rate: 68 (03/25 1121) ? ?Labs: ?Recent Labs  ?  10/01/21 ?2125 10/02/21 ?0212  ?HGB 8.0* 8.6*  ?HCT 24.8* 26.1*  ?PLT 161 151  ?CREATININE 6.79* 7.21*  ? ? ?Estimated Creatinine Clearance: 14.2 mL/min (A) (by C-G formula based on SCr of 7.21 mg/dL (H)). ? ? ?Medical History: ?Past Medical History:  ?Diagnosis Date  ? Depression   ? Diastolic dysfunction   ? a. 09/2019 Echo: EF 55-60%, no rwma, mod LVH, gr2 DD. Nl RV size/fxn. Mildly dil LA. Ao root 4.5cm.  ? Dilated aortic root ([place])   ? a. 09/2019 Echo: Ao root 4.5cm.  ? Elevated troponin level not due myocardial infarction   ? ESRD (end stage renal disease) ([place])   ? Hypertension   ? Nonobstructive Coronary Artery Disease   ? a. 09/2019 Cath: LM nl, LAD min irregs, D1 nl, D2 min irregs, LCX large, min irregs, RCA large, 20p, 30p/m.  ? PE (pulmonary embolism)   ? Renal insufficiency   ? ? ?Medications:  ?Eliquis 5 mg BID: last dose 3/25 0900 ? ?Assessment: ?66 y.o. male with severe bilateral lower extremity venous stasis ulceration. Patient on eliquis PTA for Afib, hx of DVT. Plan to hold eliquis for possible surgical intervention of wounds. Will transition to heparin infusion ? ?Goal of Therapy:  ?Heparin level 0.3-0.7 units/ml ?aPTT 66-102 seconds ?Monitor platelets by  anticoagulation protocol: Yes ?  ?Plan:  ?Start heparin infusion at 1550 units/hr 12 hours following last eliquis dose ?Check anti-Xa/aPTT level in 8 hours following start of infusion. Will monitor by aPTT levels until apTT and HL correlating and therapeutic. ?Continue to monitor H&H and platelets ? ?Dorothe Pea, PharmD, BCPS ?Clinical Pharmacist   ?10/02/2021,11:44 AM ? ? ?

## 2021-10-02 NOTE — Assessment & Plan Note (Signed)
-   Atorvastatin 40 mg nightly resumed 

## 2021-10-02 NOTE — ED Notes (Addendum)
Report given to Rayle, Therapist, sports. She is going to talk to [employer] regarding room assignment and call me back. ?

## 2021-10-02 NOTE — Plan of Care (Signed)
A&O, HD patient admitted to room 257 with wound/ulcerations to BL lower extremities. Patient c/o pain to BL lower extremities 8/10, not controlled with pain medication at the present. Patient states that he has a wound care nurse that comes to change the dressings 3 times per week. Patient also states that he takes Oxycontin 20 mg every 12 hours for pain. Patient encouraged to elevate BL legs while in bed or recliner, patient immediately states that he can't, that it is too painful. Explained to patient the benefits of elevating, patient refused to elevate. Continue to educate and encourage patient to elevate BL lower extremities ?

## 2021-10-02 NOTE — H&P (Signed)
?History and Physical  ? ?[name] MVE:[id number] DOB: 1956-03-22 DOA: 10/01/2021 ? ?PCP: [facility]  ?Outpatient Specialists: Dr. Smith Mince, [facility] ?Patient coming from: Home ? ?I have personally briefly reviewed patient's old medical records in [place]. ? ?Chief Concern: Lower extremity wounds ? ?HPI: [name] is a 66 year old male with end-stage renal disease on hemodialysis, hypertension, hyperlipidemia, persistent atrial fibrillation on anticoagulation with Eliquis, GERD, BPH, chronic pain syndrome, history of noncompliance with hemodialysis, who presents emergency department for chief concerns of right lower extremity leg pain. ? ?Vitals in the emergency department showed temperature of 98.3, respiration rate of 20, heart rate of 116 and improved to 55, blood pressure 156/92, SPO2 of 93% on room air. ? ?Serum sodium 136, potassium 4.5, chloride 96, bicarb 27, BUN 45, serum creatinine of 6.79, nonfasting blood glucose 89, GFR of 8, WBC 8, hemoglobin 8, platelets of 161. ? ?Lactic acid is 1.1. ? ?ED treatment: Cefepime 2 g IV one-time dose, metronidazole 500 mg IV one-time dose, vancomycin per pharmacy.  Oxycodone 5-325 p.o. one-time dose, morphine 4 mg IV every 3 hours as needed for moderate pain ordered by EDP. ? ?At bedside he is awake alert and oriented.  He does not appear to be in acute distress.  Though he states repeatedly that his right lower extremity is hurting him.  He states that this wound started about 2 to 3 days ago.  However he does endorse missing his outpatient wound appointments. ? ?He denies fever, nausea, vomiting, chest pain, shortness of breath.  He states that he makes a little urine, and denies any dysuria or hematuria.  He denies any diarrhea or constipation. ? ?Social history: He lives at home with his parents.  He is a current tobacco user, stating that he smokes a few cigarettes per day.  He denies EtOH, recreational drug  use. ? ?ROS: ?Constitutional: no weight change, no fever ?ENT/Mouth: no sore throat, no rhinorrhea ?Eyes: no eye pain, no vision changes ?Cardiovascular: no chest pain, no dyspnea,  no edema, no palpitations ?Respiratory: no cough, no sputum, no wheezing ?Gastrointestinal: no nausea, no vomiting, no diarrhea, no constipation ?Genitourinary: no urinary incontinence, no dysuria, no hematuria ?Musculoskeletal: no arthralgias, no myalgias ?Skin: + skin lesions, no pruritus, ?Neuro: + weakness, no loss of consciousness, no syncope ?Psych: no anxiety, no depression, no decrease appetite ?Heme/Lymph: no bruising, no bleeding ? ?ED Course: Gust with emergency medicine provider, patient requiring hospitalization for chief concerns of worsening right lower extremity wound. ? ?Assessment/Plan ? ?Principal Problem: ?  Multiple open wounds of lower leg ?Active Problems: ?  HLD (hyperlipidemia) ?  Hypertension ?  ESRD on dialysis [facility]) ?  Anemia of chronic disease ?  CAD (coronary artery disease) ?  Chronic anticoagulation ?  AF (paroxysmal atrial fibrillation) ([place]) ?  ? ?Assessment and Plan: ?* Multiple open wounds of lower leg ?- Severe breakdown of at least epidermis ?- Images documented in Epic Media ?- Patient will need debridement however at this time patient would likely benefit from vascular evaluation vascular flow ?- MRSA PCR ordered ?- Cefepime and vancomycin per pharmacy ?- Metronidazole 500 mg IV twice daily for anaerobic coverage ?- Pain control: Morphine 4 mg IV every 3 hours as needed for moderate pain, Dilaudid 1 mg IV every 3 hours.  For severe pain, 3 doses ordered ?- Staff message sent to Dr. Stanford Breed, vascular for vascular evaluation to ensure adequate blood flow ?- Would recommend a.m. team  to consider consultation general surgery for debridement after vascular evaluation ? ?AF (paroxysmal atrial fibrillation) ([place]) ?- Eliquis 5 mg twice daily resumed ? ?Chronic anticoagulation ?- Resumed Eliquis twice  daily ? ?ESRD on dialysis [facility]) ?- He reports his last full hemodialysis session was day of admission ?- Routine consult placed for nephrology, Dr. Holley Raring for resumption of hemodialysis ? ?Hypertension ?- Resumed home carvedilol 6.25 mg p.o. twice daily ?- Resumed home Bumex 1 mg, p.o., On nonhemodialysis today  ?- Labetalol injection 5 mg IV every 3 hours.  For SBP greater than 180, 3 doses ordered ? ?HLD (hyperlipidemia) ?- Atorvastatin 40 mg nightly resumed ? ?Chart reviewed.  ? ?DVT prophylaxis: Eliquis 5 milligrams twice daily ?Code Status: Full code ?Diet: Renal ?Family Communication: No ?Disposition Plan: Pending clinical course ?Consults called: Nephrology, vascular ?Admission status: Telemetry cardiac, observation ? ?Past Medical History:  ?Diagnosis Date  ? Depression   ? Diastolic dysfunction   ? a. 09/2019 Echo: EF 55-60%, no rwma, mod LVH, gr2 DD. Nl RV size/fxn. Mildly dil LA. Ao root 4.5cm.  ? Dilated aortic root ([place])   ? a. 09/2019 Echo: Ao root 4.5cm.  ? Elevated troponin level not due myocardial infarction   ? ESRD (end stage renal disease) ([place])   ? Hypertension   ? Nonobstructive Coronary Artery Disease   ? a. 09/2019 Cath: LM nl, LAD min irregs, D1 nl, D2 min irregs, LCX large, min irregs, RCA large, 20p, 30p/m.  ? PE (pulmonary embolism)   ? Renal insufficiency   ? ?Past Surgical History:  ?Procedure Laterality Date  ? LEFT HEART CATH AND CORONARY ANGIOGRAPHY N/A 09/30/2019  ? Procedure: LEFT HEART CATH AND CORONARY ANGIOGRAPHY;  Surgeon: Wellington Hampshire, MD;  Location: [place] [facility];  Service: Cardiovascular;  Laterality: N/A;  ? TOTAL HIP ARTHROPLASTY    ? ?Social History:  reports that he has been smoking cigarettes. He has been smoking an average of .5 packs per day. He has never used smokeless tobacco. He reports that he does not drink alcohol and does not use drugs. ? ?Allergies  ?Allergen Reactions  ? Cyclobenzaprine Nausea Only  ? Amlodipine   ? Clonidine Other (See Comments)  ?   Asymptomatic bradycardia to 38  ? Furosemide Other (See Comments)  ?  Caused renal failure  ? Tylenol [Acetaminophen] Other (See Comments)  ?  Reaction:  Pt states it bothers his liver  ? ?Family History  ?Problem Relation Age of Onset  ? Hypertension Mother   ? ?Family history: Family history reviewed and not pertinent ? ?Prior to Admission medications   ?Medication Sig Start Date End Date Taking? Authorizing Provider  ?albuterol (VENTOLIN HFA) 108 (90 Base) MCG/ACT inhaler Inhale 2 puffs into the lungs every 6 (six) hours as needed for wheezing or shortness of breath. 01/16/20  Yes Blake Divine, MD  ?atorvastatin (LIPITOR) 40 MG tablet Take 1 tablet (40 mg total) by mouth at bedtime. 08/10/21 10/01/21 Yes Fritzi Mandes, MD  ?bumetanide (BUMEX) 1 MG tablet Take 1 mg by mouth See admin instructions. Take 1 mg by mouth twice daily on non-dialysis days 10/12/20  Yes [provider]  ?carvedilol (COREG) 6.25 MG tablet Take 6.25 mg by mouth 2 (two) times daily. 10/12/20  Yes [provider]  ?diclofenac Sodium (VOLTAREN) 1 % GEL Apply 2 g topically 4 (four) times daily.   Yes [provider]  ?naloxone Karma Greaser) nasal spray 4 mg/0.1 mL SMARTSIG:Both Nares 09/22/21  Yes [provider]  ?  omeprazole (PRILOSEC) 40 MG capsule Take 40 mg by mouth 2 (two) times daily.   Yes [provider]  ?oxyCODONE (OXYCONTIN) 20 mg 12 hr tablet Take 20 mg by mouth every 12 (twelve) hours.   Yes [provider]  ?polyethylene glycol (MIRALAX / GLYCOLAX) 17 g packet Take 17 g by mouth daily.   Yes [provider]  ?pregabalin (LYRICA) 25 MG capsule Take 25 mg by mouth at bedtime. 09/22/21  Yes [provider]  ?tamsulosin (FLOMAX) 0.4 MG CAPS capsule Take 0.4 mg by mouth daily. 10/13/20  Yes [provider]  ?triamcinolone (KENALOG) 0.025 % cream Apply 1 application. topically 2 (two) times daily.   Yes [provider]  ?apixaban (ELIQUIS) 5 MG TABS tablet Take 1  tablet (5 mg total) by mouth 2 (two) times daily. 01/07/21 06/15/21  Wyvonnia Dusky, MD  ?ondansetron (ZOFRAN) 8 MG tablet TAKE ONE-HALF TABLET BY MOUTH EVERY 8 HOURS AS NEEDED NAUSEA 08/04/21   [provider]

## 2021-10-02 NOTE — Progress Notes (Signed)
?Progress Note ? ? ?Patient: [name] VOZ:[id number] DOB: 02/25/1956 DOA: 10/01/2021     0 ?DOS: the patient was seen and examined on 10/02/2021 ?  ?Brief hospital course: ?Mr. Jeffry Matulich is a 66 year old male with end-stage renal disease on hemodialysis, hypertension, hyperlipidemia, persistent atrial fibrillation on anticoagulation with Eliquis, GERD, BPH, chronic pain syndrome, history of noncompliance with hemodialysis, who presents emergency department for chief concerns of right lower extremity leg pain. ? ?Vitals in the emergency department showed temperature of 98.3, respiration rate of 20, heart rate of 116 and improved to 55, blood pressure 156/92, SPO2 of 93% on room air. ? ?Serum sodium 136, potassium 4.5, chloride 96, bicarb 27, BUN 45, serum creatinine of 6.79, nonfasting blood glucose 89, GFR of 8, WBC 8, hemoglobin 8, platelets of 161. ? ?Lactic acid is 1.1. ? ?ED treatment: Cefepime 2 g IV one-time dose, metronidazole 500 mg IV one-time dose, vancomycin per pharmacy.  Oxycodone 5-325 p.o. one-time dose, morphine 4 mg IV every 3 hours as needed for moderate pain ordered by EDP. ? ?Assessment and Plan: ?* Multiple open wounds of lower leg ?- Severe breakdown of at least epidermis ?- Images documented in Epic Media ?- Cefepime and vancomycin per pharmacy ?- Metronidazole 500 mg IV twice daily for anaerobic coverage ?- Continued home analgesia as tolerated for pain control ?- Vascular Surgery consulted with plans for debridement and aggressive multilayer compression therapy. Eliquis now held in anticipation for procedure early next week ?- Discussed case with Vascular Surgery ? ?AF (paroxysmal atrial fibrillation) ([place]) ?- Eliquis changed to heparin gtt in anticipation for upcoming wound debridement ? ?Chronic anticoagulation ?- Discussed with Vascular Surgery - recommendation to hold further eliquis in anticipation for debridement on 3/27 ? ?ESRD on dialysis [facility]) ?- He reports his last full  hemodialysis session was day of admission ?- nephrology following. Pt undergoes MWF HD ? ?Hypertension ?- Resumed home carvedilol 6.25 mg p.o. twice daily ?- Resumed home Bumex 1 mg, p.o., On nonhemodialysis today  ?- Labetalol injection 5 mg IV every 3 hours.  For SBP greater than 180, 3 doses ordered ? ?HLD (hyperlipidemia) ?- Atorvastatin 40 mg nightly resumed ? ? ? ? ?  ? ?Subjective: Complaining of continued pain in B LE, little improvement with dilaudid ? ?Physical Exam: ?Vitals:  ? 10/02/21 0145 10/02/21 0522 10/02/21 4259 10/02/21 1121  ?BP: 139/86 125/84 130/87 139/77  ?Pulse: 87 73 77 68  ?Resp: 18 18 18 18   ?Temp: 98 ?F (36.7 ?C) 97.9 ?F (36.6 ?C) 97.8 ?F (36.6 ?C) 97.7 ?F (36.5 ?C)  ?TempSrc:   Oral Oral  ?SpO2: 100% 100% 98% 98%  ?Weight:      ?Height:      ? ?General exam: Awake, laying in bed, in nad ?Respiratory system: Normal respiratory effort, no wheezing ?Cardiovascular system: regular rate, s1, s2 ?Gastrointestinal system: Soft, nondistended, positive BS ?Central nervous system: CN2-12 grossly intact, strength intact ?Extremities: BLE edematous with large patches of gold crusting overlying B LE wounds, some granulation is visible around crusting ?Skin: Normal skin turgor, no notable skin lesions seen ?Psychiatry: Mood normal // no visual hallucinations  ? ?Data Reviewed: ? ?Na 134, Cr 7.21 ? ?Family Communication: Pt in room, family not at bedside ? ?Disposition: ?Status is: Observation ?The patient will require care spanning > 2 midnights and should be moved to inpatient because: Severity of illness ? Planned Discharge Destination: Home ? ? ? ?Author: ?Marylu Lund, MD ?10/02/2021 3:51 PM ? ?For on  call review [email or web address]  ?

## 2021-10-03 ENCOUNTER — Other Ambulatory Visit: Payer: Self-pay

## 2021-10-03 DIAGNOSIS — D638 Anemia in other chronic diseases classified elsewhere: Secondary | ICD-10-CM | POA: Diagnosis not present

## 2021-10-03 DIAGNOSIS — L03115 Cellulitis of right lower limb: Secondary | ICD-10-CM | POA: Diagnosis not present

## 2021-10-03 LAB — CBC
HCT: 25.2 % — ABNORMAL LOW (ref 39.0–52.0)
Hemoglobin: 8.1 g/dL — ABNORMAL LOW (ref 13.0–17.0)
MCH: 27.7 pg (ref 26.0–34.0)
MCHC: 32.1 g/dL (ref 30.0–36.0)
MCV: 86.3 fL (ref 80.0–100.0)
Platelets: 151 10*3/uL (ref 150–400)
RBC: 2.92 MIL/uL — ABNORMAL LOW (ref 4.22–5.81)
RDW: 16.3 % — ABNORMAL HIGH (ref 11.5–15.5)
WBC: 7.4 10*3/uL (ref 4.0–10.5)
nRBC: 0 % (ref 0.0–0.2)

## 2021-10-03 LAB — HEPARIN LEVEL (UNFRACTIONATED): Heparin Unfractionated: >1.1 IU/mL — ABNORMAL HIGH (ref 0.30–0.70)

## 2021-10-03 LAB — APTT
aPTT: 76 seconds — ABNORMAL HIGH (ref 24–36)
aPTT: 60 seconds — ABNORMAL HIGH (ref 24–36)
aPTT: 82 seconds — ABNORMAL HIGH (ref 24–36)

## 2021-10-03 MED ORDER — EPOETIN ALFA 10000 UNIT/ML IJ SOLN
4000.0000 [IU] | INTRAMUSCULAR | Status: DC
  Administered 2021-10-06: 4000 [IU] via INTRAVENOUS

## 2021-10-03 MED ORDER — HEPARIN BOLUS VIA INFUSION
1500.0000 [IU] | Freq: Once | INTRAVENOUS | Status: AC
  Administered 2021-10-03: 1500 [IU] via INTRAVENOUS
  Filled 2021-10-03: qty 1500

## 2021-10-03 NOTE — Progress Notes (Signed)
[facility] ?[place], [place] ?10/03/21 ? ?Subjective:  ? ?Hospital day # 1 ? ?Patient known to our practice from previous admissions  ?This time patient presents for right lower extremity pain from the wound. ?Treated with broad-spectrum antibiotics including cefepime, metronidazole and vancomycin in the emergency room. ?Admitted for wound evaluation by surgical services and IV antibiotics. ? ?3/26-this morning states that he is not doing good although he is not very specific about it.  Continues to have lower extremity edema.  Able to eat without nausea or vomiting. ? ? ?Objective:  ?Vital signs in last 24 hours:  ?Temp:  [97.7 ?F (36.5 ?C)-98.6 ?F (37 ?C)] 97.7 ?F (36.5 ?C) (03/26 1740) ?Pulse Rate:  [72-78] 72 (03/26 0806) ?Resp:  [16-18] 18 (03/26 0806) ?BP: (112-137)/(69-80) 137/80 (03/26 0806) ?SpO2:  [95 %-100 %] 95 % (03/26 0806) ?Weight:  [814 kg] 123 kg (03/25 2250) ? ?Weight change: 0.529 kg ?Filed Weights  ? 10/01/21 2106 10/02/21 2250  ?Weight: 122.5 kg 123 kg  ? ? ?Intake/Output: ?  ? ?Intake/Output Summary (Last 24 hours) at 10/03/2021 1147 ?Last data filed at 10/03/2021 4818 ?Gross per 24 hour  ?Intake 996.54 ml  ?Output --  ?Net 996.54 ml  ? ? ? ? ?Physical Exam: ?General: Chronically ill-appearing gentleman, sitting up   ?HEENT Anicteric, moist oral mucous membranes  ?Pulm/lungs Normal breathing effort on room air  ?CVS/Heart No rub or gallop  ?Abdomen:  Soft  ?Extremities: 2-3+ massive pitting edema bilateral lower extremities  ?Neurologic: Alert oriented  ?Skin: skin changes from chronic edema bilaterally  ?Access: Left upper arm AV fistula, good thrill  ?   ? ? ?Basic Metabolic Panel: ? ?Recent Labs  ?Lab 10/01/21 ?2125 10/02/21 ?0212  ?NA 136 134*  ?K 4.5 4.5  ?CL 96* 95*  ?CO2 27 26  ?GLUCOSE 89 118*  ?BUN 45* 47*  ?CREATININE 6.79* 7.21*  ?CALCIUM 8.4* 8.5*  ? ? ? ? ?CBC: ?Recent Labs  ?Lab 10/01/21 ?2125 10/02/21 ?0212 10/03/21 ?0348  ?WBC 8.0 7.2 7.4  ?HGB 8.0* 8.6* 8.1*   ?HCT 24.8* 26.1* 25.2*  ?MCV 85.5 85.0 86.3  ?PLT 161 151 151  ? ? ? ?  ?Lab Results  ?Component Value Date  ? HEPBSAG NON REACTIVE 06/16/2021  ? HEPBSAB Reactive (A) 06/16/2021  ? HEPBIGM Negative 11/15/2018  ? ? ? ? ?Microbiology: ? ?Recent Results (from the past 240 hour(s))  ?Resp Panel by RT-PCR (Flu A&B, Covid) Nasopharyngeal Swab     Status: None  ? Collection Time: 10/01/21  9:41 PM  ? Specimen: Nasopharyngeal Swab; Nasopharyngeal(NP) swabs in vial transport medium  ?Result Value Ref Range Status  ? SARS Coronavirus 2 by RT PCR NEGATIVE NEGATIVE Final  ?  Comment: (NOTE) ?SARS-CoV-2 target nucleic acids are NOT DETECTED. ? ?The SARS-CoV-2 RNA is generally detectable in upper respiratory ?specimens during the acute phase of infection. The lowest ?concentration of SARS-CoV-2 viral copies this assay can detect is ?138 copies/mL. A negative result does not preclude SARS-Cov-2 ?infection and should not be used as the sole basis for treatment or ?other patient management decisions. A negative result may occur with  ?improper specimen collection/handling, submission of specimen other ?than nasopharyngeal swab, presence of viral mutation(s) within the ?areas targeted by this assay, and inadequate number of viral ?copies(<138 copies/mL). A negative result must be combined with ?clinical observations, patient history, and epidemiological ?information. The expected result is Negative. ? ?Fact Sheet for Patients:  ?EntrepreneurPulse.com.au ? ?Fact Sheet for Healthcare Providers:  ?  IncredibleEmployment.be ? ?This test is no t yet approved or cleared by the [place] FDA and  ?has been authorized for detection and/or diagnosis of SARS-CoV-2 by ?FDA under an Emergency Use Authorization (EUA). This EUA will remain  ?in effect (meaning this test can be used) for the duration of the ?COVID-19 declaration under Section 564(b)(1) of the Act, 21 ?U.S.C.section 360bbb-3(b)(1), unless the  authorization is terminated  ?or revoked sooner.  ? ? ?  ? Influenza A by PCR NEGATIVE NEGATIVE Final  ? Influenza B by PCR NEGATIVE NEGATIVE Final  ?  Comment: (NOTE) ?The Xpert Xpress SARS-CoV-2/FLU/RSV plus assay is intended as an aid ?in the diagnosis of influenza from Nasopharyngeal swab specimens and ?should not be used as a sole basis for treatment. Nasal washings and ?aspirates are unacceptable for Xpert Xpress SARS-CoV-2/FLU/RSV ?testing. ? ?Fact Sheet for Patients: ?EntrepreneurPulse.com.au ? ?Fact Sheet for Healthcare Providers: ?IncredibleEmployment.be ? ?This test is not yet approved or cleared by the [place] FDA and ?has been authorized for detection and/or diagnosis of SARS-CoV-2 by ?FDA under an Emergency Use Authorization (EUA). This EUA will remain ?in effect (meaning this test can be used) for the duration of the ?COVID-19 declaration under Section 564(b)(1) of the Act, 21 U.S.C. ?section 360bbb-3(b)(1), unless the authorization is terminated or ?revoked. ? ?Performed at [facility], [place], ?[place] [address] ?  ?MRSA Next Gen by PCR, Nasal     Status: Abnormal  ? Collection Time: 10/02/21  2:55 AM  ? Specimen: Nasal Mucosa; Nasal Swab  ?Result Value Ref Range Status  ? MRSA by PCR Next Gen DETECTED (A) NOT DETECTED Final  ?  Comment: RESULT CALLED TO, READ BACK BY AND VERIFIED WITH: ?MELISSA COBB AT 0422 10/02/21.PMF ?(NOTE) ?The GeneXpert MRSA Assay (FDA approved for NASAL specimens only), ?is one component of a comprehensive MRSA colonization surveillance ?program. It is not intended to diagnose MRSA infection nor to guide ?or monitor treatment for MRSA infections. ?Test performance is not FDA approved in patients less than 2 years ?old. ?Performed at [facility], [place], ?[place] [address] ?  ? ? ?Coagulation Studies: ?No results for input(s): LABPROT, INR in the last 72 hours. ? ?Urinalysis: ?No  results for input(s): COLORURINE, LABSPEC, [place], GLUCOSEU, HGBUR, BILIRUBINUR, KETONESUR, PROTEINUR, UROBILINOGEN, NITRITE, LEUKOCYTESUR in the last 72 hours. ? ?Invalid input(s): APPERANCEUR  ? ? ?Imaging: ?DG Tibia/Fibula Right ? ?Result Date: 10/01/2021 ?CLINICAL DATA:  Right lower extremity pain. Evaluate for soft tissue gas. EXAM: RIGHT TIBIA AND FIBULA - 2 VIEW COMPARISON:  None. FINDINGS: There is no acute fracture or dislocation. Mild arthritic changes of the right knee. There is diffuse subcutaneous edema. No radiopaque foreign object or soft tissue gas. Skin ulceration of the distal leg. IMPRESSION: 1. No acute fracture or dislocation. 2. Diffuse subcutaneous edema. Skin ulceration of the distal leg. No radiopaque foreign object or soft tissue gas. Electronically Signed   By: Anner Crete M.D.   On: 10/01/2021 21:44  ? ?DG Chest Port 1 View ? ?Result Date: 10/01/2021 ?CLINICAL DATA:  Sepsis EXAM: PORTABLE CHEST 1 VIEW COMPARISON:  08/07/2021 FINDINGS: Two frontal views of the chest demonstrates stable enlargement of the cardiac silhouette. There is chronic central vascular congestion without acute airspace disease, effusion, or pneumothorax. No acute bony abnormalities. IMPRESSION: 1. Chronic central vascular congestion without overt edema. Electronically Signed   By: Randa Ngo M.D.   On: 10/01/2021 21:44   ? ? ?Medications:  ? ?  sodium chloride Stopped (10/02/21 1429)  ? ceFEPime (MAXIPIME) IV Stopped (10/02/21 2147)  ? heparin 1,550 Units/hr (10/03/21 6606)  ? metronidazole 500 mg (10/03/21 0801)  ? vancomycin    ? ? atorvastatin  40 mg Oral QHS  ? bumetanide  1 mg Oral 2 times per day on Sun Tue Thu Sat  ? carvedilol  6.25 mg Oral BID  ? Chlorhexidine Gluconate Cloth  6 each Topical Q0600  ? mupirocin ointment  1 application. Nasal BID  ? oxyCODONE  20 mg Oral Q12H  ? pantoprazole  80 mg Oral Daily  ? pregabalin  25 mg Oral QHS  ? tamsulosin  0.4 mg Oral Daily  ? ?sodium chloride, albuterol,  HYDROmorphone (DILAUDID) injection, labetalol, morphine injection, oxyCODONE, polyethylene glycol ? ?Assessment/ Plan:  ?66 y.o. male with end-stage renal disease, hypertension, atrial fibrillation, corona

## 2021-10-03 NOTE — Progress Notes (Signed)
?  Progress Note ? ? ?Patient: [name]:[id number] DOB: December 14, 1955 DOA: 10/01/2021     1 ?DOS: the patient was seen and examined on 10/03/2021 ?  ?Brief hospital course: ?Mr. Jeffry Varady is a 66 year old male with end-stage renal disease on hemodialysis, hypertension, hyperlipidemia, persistent atrial fibrillation on anticoagulation with Eliquis, GERD, BPH, chronic pain syndrome, history of noncompliance with hemodialysis, who presents emergency department for chief concerns of right lower extremity leg pain. ? ?Vitals in the emergency department showed temperature of 98.3, respiration rate of 20, heart rate of 116 and improved to 55, blood pressure 156/92, SPO2 of 93% on room air. ? ?Serum sodium 136, potassium 4.5, chloride 96, bicarb 27, BUN 45, serum creatinine of 6.79, nonfasting blood glucose 89, GFR of 8, WBC 8, hemoglobin 8, platelets of 161. ? ?Lactic acid is 1.1. ? ?ED treatment: Cefepime 2 g IV one-time dose, metronidazole 500 mg IV one-time dose, vancomycin per pharmacy.  Oxycodone 5-325 p.o. one-time dose, morphine 4 mg IV every 3 hours as needed for moderate pain ordered by EDP. ? ?Assessment and Plan: ?* Multiple open wounds of lower leg ?- Severe breakdown of at least epidermis ?- Images documented in Epic Media ?- Cefepime and vancomycin per pharmacy ?- Metronidazole 500 mg IV twice daily for anaerobic coverage ?- Continued home analgesia as tolerated for pain control ?- Vascular Surgery consulted with plans for debridement and aggressive multilayer compression therapy. Eliquis now held in anticipation for procedure early this week ? ?AF (paroxysmal atrial fibrillation) ([place]) ?- Eliquis changed to heparin gtt in anticipation for upcoming wound debridement ? ?Chronic anticoagulation ?- Discussed with Vascular Surgery - recommendation to hold further eliquis in anticipation for debridement early this week ?-Continue heparin gtt ? ?ESRD on dialysis [facility]) ?- He reports his last full  hemodialysis session was day of admission ?- nephrology following. Pt undergoes MWF HD ? ?Hypertension ?- Resumed home carvedilol 6.25 mg p.o. twice daily ?- Resumed home Bumex 1 mg, p.o., On nonhemodialysis today  ?-BP stable ?- Labetalol injection 5 mg IV every 3 hours.  For SBP greater than 180, 3 doses ordered ? ?HLD (hyperlipidemia) ?- Atorvastatin 40 mg nightly resumed ? ? ? ? ?  ? ?Subjective: Without complaints this AM. Eager to have debridement ? ?Physical Exam: ?Vitals:  ? 10/02/21 2202 10/02/21 2250 10/03/21 0431 10/03/21 0806  ?BP: 126/69 131/78 112/72 137/80  ?Pulse: 73 78 73 72  ?Resp: 18 16 16 18   ?Temp: 98 ?F (36.7 ?C) 97.7 ?F (36.5 ?C) 98.6 ?F (37 ?C) 97.7 ?F (36.5 ?C)  ?TempSrc: Oral Oral Oral   ?SpO2: 95% 95% 95% 95%  ?Weight:  123 kg    ?Height:  6' (1.829 m)    ? ?General exam: Conversant, in no acute distress ?Respiratory system: normal chest rise, clear, no audible wheezing ?Cardiovascular system: regular rhythm, s1-s2 ?Gastrointestinal system: Nondistended, nontender, pos BS ?Central nervous system: No seizures, no tremors ?Extremities: No cyanosis, no joint deformities, BLE dressings in place ?Skin: No rashes, no pallor ?Psychiatry: Affect normal // no auditory hallucinations  ? ?Data Reviewed: ? ?Hgb 8.1 ? ?Family Communication: Pt in room, family not at bedside ? ?Disposition: ?Status is: Inpatient ?Continue inpatient stay because: Severity of illness ? Planned Discharge Destination: Home ? ? ? ?Author: ?Marylu Lund, MD ?10/03/2021 2:38 PM ? ?For on call review [email or web address]  ?

## 2021-10-03 NOTE — Progress Notes (Signed)
Order received from Dr Stanford Breed to keep patient NPO after midnight ?

## 2021-10-03 NOTE — TOC Initial Note (Signed)
Transition of Care (TOC) - Initial/Assessment Note  ? ? ?Patient Details  ?Name: [name] ?MRN: [id number] ?Date of Birth: 06/11/1956 ? ?Transition of Care (TOC) CM/SW Contact:    ?Lori Liew E Kyera Felan, LCSW ?Phone Number: ?10/03/2021, 1:50 PM ? ?Clinical Narrative:                Completed high readmission risk assessment. ?Patient lives with his parents. His sister provides transportation. ?PCP and Pharmacy is [facility].  ?Patient uses a RW. Says he is active with [facility].  ?Patient goes to dialyis M, W, and F. ? ? ?Expected Discharge Plan: Home/Self Care ?Barriers to Discharge: Continued Medical Work up ? ? ?Patient Goals and CMS Choice ?Patient states their goals for this hospitalization and ongoing recovery are:: home with parents ?CMS Medicare.gov Compare Post Acute Care list provided to:: Patient ?Choice offered to / list presented to : Patient ? ?Expected Discharge Plan and Services ?Expected Discharge Plan: Home/Self Care ?  ?  ?  ?Living arrangements for the past 2 months: [place] ?                ?  ?  ?  ?  ?  ?  ?  ?  ?  ?  ? ?Prior Living Arrangements/Services ?Living arrangements for the past 2 months: [place] ?Lives with:: Parents ?Patient language and need for interpreter reviewed:: Yes ?Do you feel safe going back to the place where you live?: Yes      ?Need for Family Participation in Patient Care: Yes (Comment) ?Care giver support system in place?: Yes (comment) ?Current home services: DME ?Criminal Activity/Legal Involvement Pertinent to Current Situation/Hospitalization: No - Comment as needed ? ?Activities of Daily Living ?  ?  ? ?Permission Sought/Granted ?  ?  ?   ?   ?   ?   ? ?Emotional Assessment ?  ?  ?  ?Orientation: : Oriented to Self, Oriented to Place, Oriented to  Time, Oriented to Situation ?Alcohol / Substance Use: Not Applicable ?Psych Involvement: No (comment) ? ?Admission diagnosis:  Right leg pain [M79.604] ?Cellulitis of right lower extremity  [L03.115] ?Multiple open wounds of lower leg [S81.809A] ?Multiple open wounds of left lower leg [S81.802A] ?Patient Active Problem List  ? Diagnosis Date Noted  ? Multiple open wounds of left lower leg 10/02/2021  ? Multiple open wounds of lower leg 10/01/2021  ? Hyperkalemia 08/08/2021  ? Acute hypoxemic respiratory failure ([place]) 08/08/2021  ? Lymphedema associated with obesity 08/07/2021  ? OSA on CPAP 08/07/2021  ? Cirrhosis of liver ([place]) 06/16/2021  ? Esophageal varices ([place]) 06/16/2021  ? Hyperphosphatemia 06/15/2021  ? Persistent atrial fibrillation ([place])   ? HFrEF (heart failure with reduced ejection fraction) ([place])   ? Chest pain 01/05/2021  ? Cough   ? CAD (coronary artery disease) 01/05/2020  ? Chronic anticoagulation 01/05/2020  ? Acute bronchitis 01/05/2020  ? AF (paroxysmal atrial fibrillation) ([place]) 01/05/2020  ? History of substance abuse ([place]) 10/24/2019  ? Elevated troponin 10/24/2019  ? Nonspecific chest pain 10/24/2019  ? Chronic diastolic CHF (congestive heart failure) ([place]) 09/28/2019  ? History of MI (myocardial infarction) 09/28/2019  ? Anemia of chronic kidney failure, stage 5 ([place]) 09/28/2019  ? History of pulmonary embolism 09/28/2019  ? Chronic midline low back pain   ? Acute respiratory failure with hypoxia ([place])   ? Acute diastolic CHF (congestive heart failure) ([place])   ? Anemia of chronic disease   ?  Thrombocytopenia ([place])   ? Congestive heart failure ([place])   ? Fluid overload 06/21/2019  ? Volume overload 11/15/2018  ? ESRD (end stage renal disease) on dialysis ([place]) 11/14/2018  ? HCAP (healthcare-associated pneumonia) 08/27/2018  ? SOB (shortness of breath) 08/25/2018  ? Chest pain of uncertain etiology 46/28/6381  ? ESRD on dialysis ([place]) 11/08/2016  ? Prostate cancer screening 11/08/2016  ? Acquired cyst of kidney 11/08/2016  ? Urinary urgency 11/08/2016  ? Acute on chronic renal failure ([place]) 09/23/2016  ? Hypertensive urgency 09/21/2016  ? Dysthymia 02/26/2016  ? Chronic pain  02/26/2016  ? Noncompliance with renal dialysis ([place]) 02/26/2016  ? Opiate abuse, continuous ([place]) 02/03/2016  ? Substance induced mood disorder ([place]) 02/03/2016  ? Antisocial personality disorder ([place]) 02/03/2016  ? Left-sided weakness 01/18/2016  ? HEPATITIS C 06/28/2008  ? HLD (hyperlipidemia) 06/28/2008  ? SUBSTANCE ABUSE, MULTIPLE 06/28/2008  ? Hypertension 06/27/2008  ? ?PCP:  [place]:   ?[id number] [address], [place] [address] ?Phone: [phone] Fax: [phone] ? ?[id number] - [place], [place] - [address] ?[address] ?[place] [address] ?Phone: [phone] Fax: [phone] ? ?[id number] - [place], [place] - [address] AT [place] ?[address] ?[place] [address] ?Phone: [phone] Fax: [phone] ? ? ? ? ?Social Determinants of Health (SDOH) Interventions ?  ? ?Readmission Risk Interventions ? ?  10/03/2021  ?  1:48 PM  ?Readmission Risk Prevention Plan  ?Transportation Screening Complete  ?Medication Review Press photographer) Complete  ?PCP or Specialist appointment within 3-5 days of discharge Complete  ?[place] or Home Care Consult Complete  ?Palliative Care Screening Not Applicable  ?[place] Not Applicable  ? ? ? ?

## 2021-10-03 NOTE — Plan of Care (Signed)
  Problem: Education: Goal: Knowledge of General Education information will improve Description: Including pain rating scale, medication(s)/side effects and non-pharmacologic comfort measures Outcome: Progressing   Problem: Clinical Measurements: Goal: Diagnostic test results will improve Outcome: Progressing   Problem: Activity: Goal: Risk for activity intolerance will decrease Outcome: Progressing   Problem: Pain Managment: Goal: General experience of comfort will improve Outcome: Progressing   

## 2021-10-03 NOTE — Consult Note (Signed)
ANTICOAGULATION CONSULT NOTE ? ?Pharmacy Consult for Heparin infusion ?Indication: atrial fibrillation; hx of PE on eliquis ? ?Allergies  ?Allergen Reactions  ? Cyclobenzaprine Nausea Only  ? Amlodipine   ? Clonidine Other (See Comments)  ?  Asymptomatic bradycardia to 38  ? Furosemide Other (See Comments)  ?  Caused renal failure  ? Tylenol [Acetaminophen] Other (See Comments)  ?  Reaction:  Pt states it bothers his liver  ? ? ?Patient Measurements: ?Height: 6' (182.9 cm) ?Weight: 123 kg (271 lb 2.7 oz) ?IBW/kg (Calculated) : 77.6 ?Heparin Dosing Weight: 104.6 kg  ? ?Vital Signs: ?Temp: 98.2 ?F (36.8 ?C) (03/26 1516) ?BP: 112/72 (03/26 1516) ?Pulse Rate: 71 (03/26 1516) ? ?Labs: ?Recent Labs  ?  10/01/21 ?2125 10/02/21 ?0212 10/02/21 ?1237 10/03/21 ?0348 10/03/21 ?1216 10/03/21 ?2215  ?HGB 8.0* 8.6*  --  8.1*  --   --   ?HCT 24.8* 26.1*  --  25.2*  --   --   ?PLT 161 151  --  151  --   --   ?APTT  --   --    < > 76* 60* 82*  ?HEPARINUNFRC  --   --   --  >1.10*  --   --   ?CREATININE 6.79* 7.21*  --   --   --   --   ? < > = values in this interval not displayed.  ? ? ? ?Estimated Creatinine Clearance: 14.2 mL/min (A) (by C-G formula based on SCr of 7.21 mg/dL (H)). ? ? ?Medical History: ?Past Medical History:  ?Diagnosis Date  ? Depression   ? Diastolic dysfunction   ? a. 09/2019 Echo: EF 55-60%, no rwma, mod LVH, gr2 DD. Nl RV size/fxn. Mildly dil LA. Ao root 4.5cm.  ? Dilated aortic root ([place])   ? a. 09/2019 Echo: Ao root 4.5cm.  ? Elevated troponin level not due myocardial infarction   ? ESRD (end stage renal disease) ([place])   ? Hypertension   ? Nonobstructive Coronary Artery Disease   ? a. 09/2019 Cath: LM nl, LAD min irregs, D1 nl, D2 min irregs, LCX large, min irregs, RCA large, 20p, 30p/m.  ? PE (pulmonary embolism)   ? Renal insufficiency   ? ? ?Medications:  ?Eliquis 5 mg BID: last dose 3/25 0900 ? ?Assessment: ?66 y.o. male with severe bilateral lower extremity venous stasis ulceration. Patient on eliquis PTA  for Afib, hx of DVT. Plan to hold eliquis for possible surgical intervention of wounds. Will transition to heparin infusion ? ?Goal of Therapy:  ?Heparin level 0.3-0.7 units/ml ?aPTT 66-102 seconds ?Monitor platelets by anticoagulation protocol: Yes ? ?3/26 0348 aPTT 76, therapeutic x 1, HL > 1.1 ?3/26 1216 aPTT 60, subtherapeutic ?3/23 2215 aPTT 82, therapeutic x 1 ?  ?Plan:  ?Continue heparin infusion at 1750 units/hr  ?Recheck aPTT w/ AM labs to confirm  ?Will monitor by aPTT levels until apTT and HL correlating and therapeutic. ?Daily CBC and HL while on heparin ? ?Renda Rolls, PharmD, MBA ?10/03/2021 ?11:50 PM ? ? ? ? ? ?

## 2021-10-03 NOTE — Progress Notes (Signed)
[facility] [place] ?PROGRESS NOTE ? ?ASSESSMENT / PLAN: ?[name] is a 66 y.o. male with severe bilateral lower extremity venous stasis ulceration. Needs compression, elevation, local wound care. These wounds would likely benefit from debridement and aggressive, multilayer compression therapy (unna boot therapy) to accelerate healing. Hold Eliquis to allow safe debridement early this week. Transition to heparin. ABI / TP pending. An angiogram may be helpful if ABI/TP not feasible or diagnostic.  ? ?SUBJECTIVE: ?No interval change. Wounds treated by bedside nurse.  ? ?OBJECTIVE: ?BP 137/80 (BP Location: Right Arm)   Pulse 72   Temp 97.7 ?F (36.5 ?C)   Resp 18   Ht 6' (1.829 m)   Wt 123 kg   SpO2 95%   BMI 36.78 kg/m?  ? ?Intake/Output Summary (Last 24 hours) at 10/03/2021 1159 ?Last data filed at 10/03/2021 1884 ?Gross per 24 hour  ?Intake 996.54 ml  ?Output --  ?Net 996.54 ml  ?  ?Constitutional: chronically ill appearing. no acute distress. ?CNS: nonfocal ?Cardiac: RRR. ?Pulmonary: unlabored ?Abdomen: obese, not distended ?Vascular: profoundly edematous. Clean bandages. ? ? ?  Latest Ref Rng & Units 10/03/2021  ?  3:48 AM 10/02/2021  ?  2:12 AM 10/01/2021  ?  9:25 PM  ?CBC  ?WBC 4.0 - 10.5 K/uL 7.4   7.2   8.0    ?Hemoglobin 13.0 - 17.0 g/dL 8.1   8.6   8.0    ?Hematocrit 39.0 - 52.0 % 25.2   26.1   24.8    ?Platelets 150 - 400 K/uL 151   151   161    ?  ? ? ?  Latest Ref Rng & Units 10/02/2021  ?  2:12 AM 10/01/2021  ?  9:25 PM 08/08/2021  ?  7:13 AM  ?CMP  ?Glucose 70 - 99 mg/dL 118   89   89    ?BUN 8 - 23 mg/dL 47   45   55    ?Creatinine 0.61 - 1.24 mg/dL 7.21   6.79   7.21    ?Sodium 135 - 145 mmol/L 134   136   137    ?Potassium 3.5 - 5.1 mmol/L 4.5   4.5   5.3    ?Chloride 98 - 111 mmol/L 95   96   98    ?CO2 22 - 32 mmol/L 26   27   26     ?Calcium 8.9 - 10.3 mg/dL 8.5   8.4   8.9    ?Total Protein 6.5 - 8.1 g/dL  8.2     ?Total Bilirubin 0.3 - 1.2 mg/dL  0.9     ?Alkaline  Phos 38 - 126 U/L  173     ?AST 15 - 41 U/L  31     ?ALT 0 - 44 U/L  38     ? ? ?Estimated Creatinine Clearance: 14.2 mL/min (A) (by C-G formula based on SCr of 7.21 mg/dL (H)). ? ? ?Yevonne Aline. Stanford Breed, MD ?[facility] ?Office Phone Number: [phone] ?10/03/2021 11:59 AM ? ? ? ?

## 2021-10-03 NOTE — Consult Note (Signed)
ANTICOAGULATION CONSULT NOTE - Initial Consult ? ?Pharmacy Consult for Heparin infusion ?Indication: atrial fibrillation; hx of PE on eliquis ? ?Allergies  ?Allergen Reactions  ? Cyclobenzaprine Nausea Only  ? Amlodipine   ? Clonidine Other (See Comments)  ?  Asymptomatic bradycardia to 38  ? Furosemide Other (See Comments)  ?  Caused renal failure  ? Tylenol [Acetaminophen] Other (See Comments)  ?  Reaction:  Pt states it bothers his liver  ? ? ?Patient Measurements: ?Height: 6' (182.9 cm) ?Weight: 123 kg (271 lb 2.7 oz) ?IBW/kg (Calculated) : 77.6 ?Heparin Dosing Weight: 104.6 kg  ? ?Vital Signs: ?Temp: 97.7 ?F (36.5 ?C) (03/26 8295) ?Temp Source: Oral (03/26 0431) ?BP: 137/80 (03/26 0806) ?Pulse Rate: 72 (03/26 0806) ? ?Labs: ?Recent Labs  ?  10/01/21 ?2125 10/02/21 ?0212 10/02/21 ?1237 10/03/21 ?0348  ?HGB 8.0* 8.6*  --  8.1*  ?HCT 24.8* 26.1*  --  25.2*  ?PLT 161 151  --  151  ?APTT  --   --  48* 76*  ?HEPARINUNFRC  --   --   --  >1.10*  ?CREATININE 6.79* 7.21*  --   --   ? ? ? ?Estimated Creatinine Clearance: 14.2 mL/min (A) (by C-G formula based on SCr of 7.21 mg/dL (H)). ? ? ?Medical History: ?Past Medical History:  ?Diagnosis Date  ? Depression   ? Diastolic dysfunction   ? a. 09/2019 Echo: EF 55-60%, no rwma, mod LVH, gr2 DD. Nl RV size/fxn. Mildly dil LA. Ao root 4.5cm.  ? Dilated aortic root ([place])   ? a. 09/2019 Echo: Ao root 4.5cm.  ? Elevated troponin level not due myocardial infarction   ? ESRD (end stage renal disease) ([place])   ? Hypertension   ? Nonobstructive Coronary Artery Disease   ? a. 09/2019 Cath: LM nl, LAD min irregs, D1 nl, D2 min irregs, LCX large, min irregs, RCA large, 20p, 30p/m.  ? PE (pulmonary embolism)   ? Renal insufficiency   ? ? ?Medications:  ?Eliquis 5 mg BID: last dose 3/25 0900 ? ?Assessment: ?66 y.o. male with severe bilateral lower extremity venous stasis ulceration. Patient on eliquis PTA for Afib, hx of DVT. Plan to hold eliquis for possible surgical intervention of wounds.  Will transition to heparin infusion ? ?Goal of Therapy:  ?Heparin level 0.3-0.7 units/ml ?aPTT 66-102 seconds ?Monitor platelets by anticoagulation protocol: Yes ? ?3/26 0348 aPTT 76, therapeutic x 1, HL > 1.1 ?3/26 1216 aPTT 60, subtherapeutic ?  ?Plan:  ?Bolus 1500 units x 1 ?Increase heparin infusion to 1750 units/hr  ?Recheck aPTT level in 8 hr after rate change  ?Will monitor by aPTT levels until apTT and HL correlating and therapeutic. ?Daily CBC and HL while on heparin ? ?Pearla Dubonnet, PharmD ?Clinical Pharmacist ?10/03/2021 ?2:12 PM ? ? ? ? ?

## 2021-10-03 NOTE — Consult Note (Signed)
ANTICOAGULATION CONSULT NOTE - Initial Consult ? ?Pharmacy Consult for Heparin infusion ?Indication: atrial fibrillation; hx of PE on eliquis ? ?Allergies  ?Allergen Reactions  ? Cyclobenzaprine Nausea Only  ? Amlodipine   ? Clonidine Other (See Comments)  ?  Asymptomatic bradycardia to 38  ? Furosemide Other (See Comments)  ?  Caused renal failure  ? Tylenol [Acetaminophen] Other (See Comments)  ?  Reaction:  Pt states it bothers his liver  ? ? ?Patient Measurements: ?Height: 6' (182.9 cm) ?Weight: 123 kg (271 lb 2.7 oz) ?IBW/kg (Calculated) : 77.6 ?Heparin Dosing Weight: 104.6 kg  ? ?Vital Signs: ?Temp: 98.6 ?F (37 ?C) (03/26 0431) ?Temp Source: Oral (03/26 0431) ?BP: 112/72 (03/26 0431) ?Pulse Rate: 73 (03/26 0431) ? ?Labs: ?Recent Labs  ?  10/01/21 ?2125 10/02/21 ?0212 10/02/21 ?1237 10/03/21 ?0348  ?HGB 8.0* 8.6*  --  8.1*  ?HCT 24.8* 26.1*  --  25.2*  ?PLT 161 151  --  151  ?APTT  --   --  48* 76*  ?HEPARINUNFRC  --   --   --  >1.10*  ?CREATININE 6.79* 7.21*  --   --   ? ? ? ?Estimated Creatinine Clearance: 14.2 mL/min (A) (by C-G formula based on SCr of 7.21 mg/dL (H)). ? ? ?Medical History: ?Past Medical History:  ?Diagnosis Date  ? Depression   ? Diastolic dysfunction   ? a. 09/2019 Echo: EF 55-60%, no rwma, mod LVH, gr2 DD. Nl RV size/fxn. Mildly dil LA. Ao root 4.5cm.  ? Dilated aortic root ([place])   ? a. 09/2019 Echo: Ao root 4.5cm.  ? Elevated troponin level not due myocardial infarction   ? ESRD (end stage renal disease) ([place])   ? Hypertension   ? Nonobstructive Coronary Artery Disease   ? a. 09/2019 Cath: LM nl, LAD min irregs, D1 nl, D2 min irregs, LCX large, min irregs, RCA large, 20p, 30p/m.  ? PE (pulmonary embolism)   ? Renal insufficiency   ? ? ?Medications:  ?Eliquis 5 mg BID: last dose 3/25 0900 ? ?Assessment: ?66 y.o. male with severe bilateral lower extremity venous stasis ulceration. Patient on eliquis PTA for Afib, hx of DVT. Plan to hold eliquis for possible surgical intervention of wounds.  Will transition to heparin infusion ? ?Goal of Therapy:  ?Heparin level 0.3-0.7 units/ml ?aPTT 66-102 seconds ?Monitor platelets by anticoagulation protocol: Yes ? ?3/26 0348 aPTT 76, therapeutic x 1, HL > 1.1 ?  ?Plan:  ?Continue heparin infusion at 1550 units/hr  ?Recheck aPTT level in 8 hr to confirm.  ?Will monitor by aPTT levels until apTT and HL correlating and therapeutic. ?Daily CBC and HL while on heparin ? ?Renda Rolls, PharmD, MBA ?10/03/2021 ?4:56 AM ? ? ? ?

## 2021-10-03 NOTE — Progress Notes (Signed)
Patient will not elevate his legs in the bed. He said it hurts too much and he prefers to let them dangle ?

## 2021-10-04 DIAGNOSIS — I480 Paroxysmal atrial fibrillation: Secondary | ICD-10-CM | POA: Diagnosis not present

## 2021-10-04 DIAGNOSIS — L03115 Cellulitis of right lower limb: Secondary | ICD-10-CM | POA: Diagnosis not present

## 2021-10-04 LAB — HEPATITIS B SURFACE ANTIGEN: Hepatitis B Surface Ag: NONREACTIVE

## 2021-10-04 LAB — CBC
HCT: 23.8 % — ABNORMAL LOW (ref 39.0–52.0)
Hemoglobin: 7.7 g/dL — ABNORMAL LOW (ref 13.0–17.0)
MCH: 27.7 pg (ref 26.0–34.0)
MCHC: 32.4 g/dL (ref 30.0–36.0)
MCV: 85.6 fL (ref 80.0–100.0)
Platelets: 142 10*3/uL — ABNORMAL LOW (ref 150–400)
RBC: 2.78 MIL/uL — ABNORMAL LOW (ref 4.22–5.81)
RDW: 16 % — ABNORMAL HIGH (ref 11.5–15.5)
WBC: 7.1 10*3/uL (ref 4.0–10.5)
nRBC: 0 % (ref 0.0–0.2)

## 2021-10-04 LAB — HEPARIN LEVEL (UNFRACTIONATED): Heparin Unfractionated: 0.86 IU/mL — ABNORMAL HIGH (ref 0.30–0.70)

## 2021-10-04 LAB — COMPREHENSIVE METABOLIC PANEL
ALT: 22 U/L (ref 0–44)
AST: 15 U/L (ref 15–41)
Albumin: 2.8 g/dL — ABNORMAL LOW (ref 3.5–5.0)
Alkaline Phosphatase: 135 U/L — ABNORMAL HIGH (ref 38–126)
Anion gap: 16 — ABNORMAL HIGH (ref 5–15)
BUN: 71 mg/dL — ABNORMAL HIGH (ref 8–23)
CO2: 22 mmol/L (ref 22–32)
Calcium: 8.2 mg/dL — ABNORMAL LOW (ref 8.9–10.3)
Chloride: 98 mmol/L (ref 98–111)
Creatinine, Ser: 9.58 mg/dL — ABNORMAL HIGH (ref 0.61–1.24)
GFR, Estimated: 6 mL/min — ABNORMAL LOW (ref >60)
Glucose, Bld: 90 mg/dL (ref 70–99)
Potassium: 5.5 mmol/L — ABNORMAL HIGH (ref 3.5–5.1)
Sodium: 136 mmol/L (ref 135–145)
Total Bilirubin: 1.1 mg/dL (ref 0.3–1.2)
Total Protein: 7.4 g/dL (ref 6.5–8.1)

## 2021-10-04 LAB — APTT: aPTT: 93 seconds — ABNORMAL HIGH (ref 24–36)

## 2021-10-04 LAB — HEPATITIS B SURFACE ANTIBODY,QUALITATIVE: Hep B S Ab: REACTIVE — AB

## 2021-10-04 MED ORDER — MORPHINE SULFATE (PF) 2 MG/ML IV SOLN
INTRAVENOUS | Status: AC
  Filled 2021-10-04: qty 1

## 2021-10-04 NOTE — Consult Note (Signed)
ANTICOAGULATION CONSULT NOTE ? ?Pharmacy Consult for Heparin infusion ?Indication: atrial fibrillation; hx of PE on eliquis ? ?Allergies  ?Allergen Reactions  ? Cyclobenzaprine Nausea Only  ? Amlodipine   ? Clonidine Other (See Comments)  ?  Asymptomatic bradycardia to 38  ? Furosemide Other (See Comments)  ?  Caused renal failure  ? Tylenol [Acetaminophen] Other (See Comments)  ?  Reaction:  Pt states it bothers his liver  ? ? ?Patient Measurements: ?Height: 6' (182.9 cm) ?Weight: 123 kg (271 lb 2.7 oz) ?IBW/kg (Calculated) : 77.6 ?Heparin Dosing Weight: 104.6 kg  ? ?Vital Signs: ?Temp: 98.2 ?F (36.8 ?C) (03/27 0569) ?Temp Source: Oral (03/27 7948) ?BP: 117/71 (03/27 0165) ?Pulse Rate: 78 (03/27 0312) ? ?Labs: ?Recent Labs  ?  10/01/21 ?2125 10/02/21 ?0212 10/02/21 ?1237 10/03/21 ?0348 10/03/21 ?1216 10/03/21 ?2215 10/04/21 ?0522  ?HGB 8.0* 8.6*  --  8.1*  --   --  7.7*  ?HCT 24.8* 26.1*  --  25.2*  --   --  23.8*  ?PLT 161 151  --  151  --   --  142*  ?APTT  --   --    < > 76* 60* 82* 93*  ?HEPARINUNFRC  --   --   --  >1.10*  --   --  0.86*  ?CREATININE 6.79* 7.21*  --   --   --   --   --   ? < > = values in this interval not displayed.  ? ? ? ?Estimated Creatinine Clearance: 14.2 mL/min (A) (by C-G formula based on SCr of 7.21 mg/dL (H)). ? ? ?Medical History: ?Past Medical History:  ?Diagnosis Date  ? Depression   ? Diastolic dysfunction   ? a. 09/2019 Echo: EF 55-60%, no rwma, mod LVH, gr2 DD. Nl RV size/fxn. Mildly dil LA. Ao root 4.5cm.  ? Dilated aortic root ([place])   ? a. 09/2019 Echo: Ao root 4.5cm.  ? Elevated troponin level not due myocardial infarction   ? ESRD (end stage renal disease) ([place])   ? Hypertension   ? Nonobstructive Coronary Artery Disease   ? a. 09/2019 Cath: LM nl, LAD min irregs, D1 nl, D2 min irregs, LCX large, min irregs, RCA large, 20p, 30p/m.  ? PE (pulmonary embolism)   ? Renal insufficiency   ? ? ?Medications:  ?Eliquis 5 mg BID: last dose 3/25 0900 ? ?Assessment: ?66 y.o. male with  severe bilateral lower extremity venous stasis ulceration. Patient on eliquis PTA for Afib, hx of DVT. Plan to hold eliquis for possible surgical intervention of wounds. Will transition to heparin infusion ? ?Goal of Therapy:  ?Heparin level 0.3-0.7 units/ml ?aPTT 66-102 seconds ?Monitor platelets by anticoagulation protocol: Yes ? ?3/26 0348 aPTT 76, therapeutic x 1, HL > 1.1 ?3/26 1216 aPTT 60, subtherapeutic ?3/26 2215 aPTT 82, therapeutic x 1 ?3/27 0522 aPTT 93, therapeutic x 2, HL 0.86 ?  ?Plan:  ?Continue heparin infusion at 1750 units/hr  ?Recheck aPTT w/ AM labs daily ?Will monitor by aPTT levels until apTT and HL correlating and therapeutic. ?Daily CBC and HL while on heparin ? ?Renda Rolls, PharmD, MBA ?10/04/2021 ?6:48 AM ? ? ? ? ? ?

## 2021-10-04 NOTE — Progress Notes (Signed)
Hemodialysis ultrafiltration goal turned off due to patient complaints of hand cramping. Shantelle Breeze,NP made aware at bedside. ?

## 2021-10-04 NOTE — Progress Notes (Signed)
Dr Lucky Cowboy does not plan to debride the patients legs today and said to resume his diet. Renal with fluid restriction ordered ?

## 2021-10-04 NOTE — Progress Notes (Signed)
Patient transported to HD 

## 2021-10-04 NOTE — Progress Notes (Signed)
Hemodialysis Post Treatment Note: ? ?Tx date: 10/04/2021 ?Tx time:3 hours ?Access: Left CVC ?UF Removed:2329ml ? ?Note: ?10:01  HD started  ?10:15 pt had been complaining of pain but unable to give yet because meds are not yet due. Pt had been constantly in pain  ?11:15 pt was sleeping ?12:33 pt woke up and complaining of pain Right leg pain again . Morphine 4mg  IV given. ?13:38 HD completed.  ? ? ? ? ? ? ? ? ?  ?

## 2021-10-04 NOTE — Progress Notes (Signed)
[facility] ?[place], [place] ?10/04/21 ? ?Subjective:  ? ?Hospital day # 2 ? ?Patient known to our practice from previous admissions  ?This time patient presents for right lower extremity pain from the wound. ?Treated with broad-spectrum antibiotics including cefepime, metronidazole and vancomycin in the emergency room. ?Admitted for wound evaluation by surgical services and IV antibiotics. ? ?Patient seen sitting at side of bed, eating breakfast ?Denies nausea and vomiting ?Plan for dialysis later this morning ? ?Patient evaluated during dialysis ?  ?HEMODIALYSIS FLOWSHEET: ? ?Blood Flow Rate (mL/min): 400 mL/min ?Arterial Pressure (mmHg): -170 mmHg ?Venous Pressure (mmHg): 180 mmHg ?Transmembrane Pressure (mmHg): 80 mmHg ?Ultrafiltration Rate (mL/min): 0 mL/min (PT C/O OF CRAMPING, PER RN UF OFF) ?Dialysate Flow Rate (mL/min): 600 ml/min ?Conductivity: Machine : 13.8 ?Conductivity: Machine : 13.8 ?Dialysis Fluid Bolus: Normal Saline ?Bolus Amount (mL): 250 mL ? ?Continues to complain of bilateral leg pain ? ? ?Objective:  ?Vital signs in last 24 hours:  ?Temp:  [97.6 ?F (36.4 ?C)-98.4 ?F (36.9 ?C)] 97.6 ?F (36.4 ?C) (03/27 0957) ?Pulse Rate:  [49-130] 59 (03/27 1315) ?Resp:  [13-22] 14 (03/27 1315) ?BP: (112-137)/(42-110) 123/76 (03/27 1315) ?SpO2:  [92 %-97 %] 95 % (03/27 1100) ?Weight:  [127.9 kg] 127.9 kg (03/27 0957) ? ?Weight change:  ?Filed Weights  ? 10/01/21 2106 10/02/21 2250 10/04/21 0957  ?Weight: 122.5 kg 123 kg 127.9 kg  ? ? ?Intake/Output: ?  ? ?Intake/Output Summary (Last 24 hours) at 10/04/2021 1353 ?Last data filed at 10/04/2021 0944 ?Gross per 24 hour  ?Intake 1458.19 ml  ?Output 100 ml  ?Net 1358.19 ml  ? ? ? ? ?Physical Exam: ?General: NAD, sitting up   ?HEENT Anicteric, moist oral mucous membranes  ?Pulm/lungs Normal breathing effort on room air  ?CVS/Heart No rub or gallop  ?Abdomen:  Soft  ?Extremities: 2-3+ massive pitting edema bilateral lower extremities, bilateral gauze  leg wraps  ?Neurologic: Alert oriented  ?Skin: skin changes from chronic edema bilaterally  ?Access: Left upper arm AV fistula, good thrill  ?   ? ? ?Basic Metabolic Panel: ? ?Recent Labs  ?Lab 10/01/21 ?2125 10/02/21 ?0212 10/04/21 ?0522  ?NA 136 134* 136  ?K 4.5 4.5 5.5*  ?CL 96* 95* 98  ?CO2 27 26 22   ?GLUCOSE 89 118* 90  ?BUN 45* 47* 71*  ?CREATININE 6.79* 7.21* 9.58*  ?CALCIUM 8.4* 8.5* 8.2*  ? ? ? ? ?CBC: ?Recent Labs  ?Lab 10/01/21 ?2125 10/02/21 ?0212 10/03/21 ?0348 10/04/21 ?0522  ?WBC 8.0 7.2 7.4 7.1  ?HGB 8.0* 8.6* 8.1* 7.7*  ?HCT 24.8* 26.1* 25.2* 23.8*  ?MCV 85.5 85.0 86.3 85.6  ?PLT 161 151 151 142*  ? ? ? ?  ?Lab Results  ?Component Value Date  ? HEPBSAG NON REACTIVE 06/16/2021  ? HEPBSAB Reactive (A) 06/16/2021  ? HEPBIGM Negative 11/15/2018  ? ? ? ? ?Microbiology: ? ?Recent Results (from the past 240 hour(s))  ?Resp Panel by RT-PCR (Flu A&B, Covid) Nasopharyngeal Swab     Status: None  ? Collection Time: 10/01/21  9:41 PM  ? Specimen: Nasopharyngeal Swab; Nasopharyngeal(NP) swabs in vial transport medium  ?Result Value Ref Range Status  ? SARS Coronavirus 2 by RT PCR NEGATIVE NEGATIVE Final  ?  Comment: (NOTE) ?SARS-CoV-2 target nucleic acids are NOT DETECTED. ? ?The SARS-CoV-2 RNA is generally detectable in upper respiratory ?specimens during the acute phase of infection. The lowest ?concentration of SARS-CoV-2 viral copies this assay can detect is ?138 copies/mL. A negative result does not preclude  SARS-Cov-2 ?infection and should not be used as the sole basis for treatment or ?other patient management decisions. A negative result may occur with  ?improper specimen collection/handling, submission of specimen other ?than nasopharyngeal swab, presence of viral mutation(s) within the ?areas targeted by this assay, and inadequate number of viral ?copies(<138 copies/mL). A negative result must be combined with ?clinical observations, patient history, and epidemiological ?information. The expected  result is Negative. ? ?Fact Sheet for Patients:  ?EntrepreneurPulse.com.au ? ?Fact Sheet for Healthcare Providers:  ?IncredibleEmployment.be ? ?This test is no t yet approved or cleared by the [place] FDA and  ?has been authorized for detection and/or diagnosis of SARS-CoV-2 by ?FDA under an Emergency Use Authorization (EUA). This EUA will remain  ?in effect (meaning this test can be used) for the duration of the ?COVID-19 declaration under Section 564(b)(1) of the Act, 21 ?U.S.C.section 360bbb-3(b)(1), unless the authorization is terminated  ?or revoked sooner.  ? ? ?  ? Influenza A by PCR NEGATIVE NEGATIVE Final  ? Influenza B by PCR NEGATIVE NEGATIVE Final  ?  Comment: (NOTE) ?The Xpert Xpress SARS-CoV-2/FLU/RSV plus assay is intended as an aid ?in the diagnosis of influenza from Nasopharyngeal swab specimens and ?should not be used as a sole basis for treatment. Nasal washings and ?aspirates are unacceptable for Xpert Xpress SARS-CoV-2/FLU/RSV ?testing. ? ?Fact Sheet for Patients: ?EntrepreneurPulse.com.au ? ?Fact Sheet for Healthcare Providers: ?IncredibleEmployment.be ? ?This test is not yet approved or cleared by the [place] FDA and ?has been authorized for detection and/or diagnosis of SARS-CoV-2 by ?FDA under an Emergency Use Authorization (EUA). This EUA will remain ?in effect (meaning this test can be used) for the duration of the ?COVID-19 declaration under Section 564(b)(1) of the Act, 21 U.S.C. ?section 360bbb-3(b)(1), unless the authorization is terminated or ?revoked. ? ?Performed at [facility], [place], ?[place] [address] ?  ?MRSA Next Gen by PCR, Nasal     Status: Abnormal  ? Collection Time: 10/02/21  2:55 AM  ? Specimen: Nasal Mucosa; Nasal Swab  ?Result Value Ref Range Status  ? MRSA by PCR Next Gen DETECTED (A) NOT DETECTED Final  ?  Comment: RESULT CALLED TO, READ BACK BY AND VERIFIED  WITH: ?MELISSA COBB AT 0422 10/02/21.PMF ?(NOTE) ?The GeneXpert MRSA Assay (FDA approved for NASAL specimens only), ?is one component of a comprehensive MRSA colonization surveillance ?program. It is not intended to diagnose MRSA infection nor to guide ?or monitor treatment for MRSA infections. ?Test performance is not FDA approved in patients less than 2 years ?old. ?Performed at [facility], [place], ?[place] [address] ?  ? ? ?Coagulation Studies: ?No results for input(s): LABPROT, INR in the last 72 hours. ? ?Urinalysis: ?No results for input(s): COLORURINE, LABSPEC, [place], GLUCOSEU, HGBUR, BILIRUBINUR, KETONESUR, PROTEINUR, UROBILINOGEN, NITRITE, LEUKOCYTESUR in the last 72 hours. ? ?Invalid input(s): APPERANCEUR  ? ? ?Imaging: ?No results found. ? ? ?Medications:  ? ? sodium chloride Stopped (10/04/21 0935)  ? ceFEPime (MAXIPIME) IV Stopped (10/03/21 2302)  ? heparin 1,750 Units/hr (10/04/21 0944)  ? metronidazole Stopped (10/04/21 0920)  ? vancomycin 1,000 mg (10/04/21 1231)  ? ? atorvastatin  40 mg Oral QHS  ? bumetanide  1 mg Oral 2 times per day on Sun Tue Thu Sat  ? carvedilol  6.25 mg Oral BID  ? Chlorhexidine Gluconate Cloth  6 each Topical Q0600  ? epoetin (EPOGEN/PROCRIT) injection  4,000 Units Intravenous Q M,W,F-HD  ? morphine (PF)      ?  morphine (PF)      ? mupirocin ointment  1 application. Nasal BID  ? oxyCODONE  20 mg Oral Q12H  ? pantoprazole  80 mg Oral Daily  ? pregabalin  25 mg Oral QHS  ? tamsulosin  0.4 mg Oral Daily  ? ?sodium chloride, albuterol, HYDROmorphone (DILAUDID) injection, labetalol, morphine injection, oxyCODONE, polyethylene glycol ? ?Assessment/ Plan:  ?66 y.o. male with end-stage renal disease, hypertension, atrial fibrillation, coronary artery disease, pulmonary embolism, hyperlipidemia, polysubstance abuse, history of chronic pain syndrome ?Admission at [facility] for acute respiratory failure, COVID-19  in January 2023 ?This time he is admitted on  10/01/2021 for ?Right leg pain [M79.604] ?Cellulitis of right lower extremity [L03.115] ?Multiple open wounds of lower leg [S81.809A] ?Multiple open wounds of left lower leg [S81.802A] ? ?Outpatient hemodial

## 2021-10-04 NOTE — Progress Notes (Signed)
Pharmacy Antibiotic Note ? ?[name] is a 66 y.o. male ESRD pt on HD admitted on 10/01/2021 with cellulitis/wound infection.  Pharmacy has been consulted for vancomycin and cefepime dosing. Pt to undergo I&D. ? ?Plan: ?Continue cefepime 1 gm q24h and Vancomycin 1 gm post-HD on dialysis days ?Consider random vanc level with AM labs ?Pharmacy will continue to follow and adjust dosing if warranted. ? ?Height: 6' (182.9 cm) ?Weight: 123 kg (271 lb 2.7 oz) ?IBW/kg (Calculated) : 77.6 ? ?Temp (24hrs), Avg:98 ?F (36.7 ?C), Min:97.7 ?F (36.5 ?C), Max:98.2 ?F (36.8 ?C) ? ?Recent Labs  ?Lab 10/01/21 ?2125 10/02/21 ?0212 10/03/21 ?0348 10/04/21 ?0522  ?WBC 8.0 7.2 7.4 7.1  ?CREATININE 6.79* 7.21*  --  9.58*  ?LATICACIDVEN 1.1 0.9  --   --   ? ?  ?Estimated Creatinine Clearance: 10.7 mL/min (A) (by C-G formula based on SCr of 9.58 mg/dL (H)).   ? ?Allergies  ?Allergen Reactions  ? Cyclobenzaprine Nausea Only  ? Amlodipine   ? Clonidine Other (See Comments)  ?  Asymptomatic bradycardia to 38  ? Furosemide Other (See Comments)  ?  Caused renal failure  ? Tylenol [Acetaminophen] Other (See Comments)  ?  Reaction:  Pt states it bothers his liver  ? ? ?Antimicrobials this admission: ?3/24 Vancomycin >> ?3/24 Cefepime >> ?3/24 Flagyl >>  ? ?Microbiology results: ?3/25 MRSA PCR: positive ? ?Thank you for allowing pharmacy to be a part of this patient?s care. ? ?Sherilyn Banker, PharmD ?Clinical Pharmacist  ?10/04/2021 7:05 AM  ? ? ?

## 2021-10-04 NOTE — Progress Notes (Signed)
?  Progress Note ? ? ?Patient: [name] XBL:[id number] DOB: 1956/07/06 DOA: 10/01/2021     2 ?DOS: the patient was seen and examined on 10/04/2021 ?  ?Brief hospital course: ?Mr. Jeffry Flax is a 66 year old male with end-stage renal disease on hemodialysis, hypertension, hyperlipidemia, persistent atrial fibrillation on anticoagulation with Eliquis, GERD, BPH, chronic pain syndrome, history of noncompliance with hemodialysis, who presents emergency department for chief concerns of right lower extremity leg pain. ? ?Vitals in the emergency department showed temperature of 98.3, respiration rate of 20, heart rate of 116 and improved to 55, blood pressure 156/92, SPO2 of 93% on room air. ? ?Serum sodium 136, potassium 4.5, chloride 96, bicarb 27, BUN 45, serum creatinine of 6.79, nonfasting blood glucose 89, GFR of 8, WBC 8, hemoglobin 8, platelets of 161. ? ?Lactic acid is 1.1. ? ?ED treatment: Cefepime 2 g IV one-time dose, metronidazole 500 mg IV one-time dose, vancomycin per pharmacy.  Oxycodone 5-325 p.o. one-time dose, morphine 4 mg IV every 3 hours as needed for moderate pain ordered by EDP. ? ?Assessment and Plan: ?* Multiple open wounds of lower leg ?- Severe breakdown of at least epidermis ?- Images documented in [employer] ?- Cefepime and vancomycin per pharmacy ?- Metronidazole 500 mg IV twice daily for anaerobic coverage ?- Continued home analgesia as tolerated for pain control ?- Vascular Surgery consulted with plans for debridement and aggressive multilayer compression therapy. Eliquis now held in anticipation for procedure this week ? ?AF (paroxysmal atrial fibrillation) ([place]) ?- Eliquis changed to heparin gtt in anticipation for upcoming wound debridement ? ?Chronic anticoagulation ?- Discussed with Vascular Surgery - recommendation to hold further eliquis in anticipation for debridement this week ?-Continue heparin gtt ? ?ESRD on dialysis [facility]) ?- He reports his last full hemodialysis session  was day of admission ?- nephrology following. Pt undergoes MWF HD ?- Seen on HD today ? ?Hypertension ?- Resumed home carvedilol 6.25 mg p.o. twice daily ?- Resumed home Bumex 1 mg, p.o., On nonhemodialysis today  ?-BP stable ?- Labetalol injection 5 mg IV every 3 hours.  For SBP greater than 180, 3 doses ordered ? ?HLD (hyperlipidemia) ?- Atorvastatin 40 mg nightly resumed ? ? ? ? ?  ? ?Subjective: Seen on HD this AM. Denies chest pain or sob ? ?Physical Exam: ?Vitals:  ? 10/04/21 1330 10/04/21 1338 10/04/21 1345 10/04/21 1400  ?BP: 130/71 131/83 131/83 135/64  ?Pulse: 75 69 (!) 32 75  ?Resp: 14 16 (!) 21 20  ?Temp:      ?TempSrc:      ?SpO2:  92%    ?Weight:  124.9 kg    ?Height:      ? ?General exam: Awake, laying in bed, in nad ?Respiratory system: Normal respiratory effort, no wheezing ?Cardiovascular system: regular rate, s1, s2 ?Gastrointestinal system: Soft, nondistended, positive BS ?Central nervous system: CN2-12 grossly intact, strength intact ?Extremities: Perfused, no clubbing ?Skin: Normal skin turgor, no notable skin lesions seen ?Psychiatry: Mood normal // no visual hallucinations  ? ?Data Reviewed: ? ?Hgb 7.7, K 5.5 ? ?Family Communication: Pt in room, family not at bedside ? ?Disposition: ?Status is: Inpatient ?Continue inpatient stay because: Severity of illness ? Planned Discharge Destination: Home ? ? ? ?Author: ?Marylu Lund, MD ?10/04/2021 2:32 PM ? ?For on call review [email or web address].  ?

## 2021-10-04 NOTE — Progress Notes (Signed)
Hemodialysis patient known at [facility] MWF 11:00am, normally transports on ACTA. Patient stated no dialysis concerns.  ?

## 2021-10-04 NOTE — Plan of Care (Signed)
?  Problem: Clinical Measurements: ?Goal: Will remain free from infection ?Outcome: Progressing ?  ?Problem: Clinical Measurements: ?Goal: Diagnostic test results will improve ?Outcome: Progressing ?  ?Problem: Pain Managment: ?Goal: General experience of comfort will improve ?Outcome: Progressing ?  ?Problem: Skin Integrity: ?Goal: Risk for impaired skin integrity will decrease ?Outcome: Progressing ?  ?

## 2021-10-05 DIAGNOSIS — L03115 Cellulitis of right lower limb: Secondary | ICD-10-CM | POA: Diagnosis not present

## 2021-10-05 DIAGNOSIS — D638 Anemia in other chronic diseases classified elsewhere: Secondary | ICD-10-CM | POA: Diagnosis not present

## 2021-10-05 LAB — CBC
HCT: 24.6 % — ABNORMAL LOW (ref 39.0–52.0)
Hemoglobin: 7.9 g/dL — ABNORMAL LOW (ref 13.0–17.0)
MCH: 27.8 pg (ref 26.0–34.0)
MCHC: 32.1 g/dL (ref 30.0–36.0)
MCV: 86.6 fL (ref 80.0–100.0)
Platelets: 130 10*3/uL — ABNORMAL LOW (ref 150–400)
RBC: 2.84 MIL/uL — ABNORMAL LOW (ref 4.22–5.81)
RDW: 16.3 % — ABNORMAL HIGH (ref 11.5–15.5)
WBC: 7.2 10*3/uL (ref 4.0–10.5)
nRBC: 0 % (ref 0.0–0.2)

## 2021-10-05 LAB — APTT: aPTT: 103 seconds — ABNORMAL HIGH (ref 24–36)

## 2021-10-05 LAB — HEPATITIS B SURFACE ANTIBODY, QUANTITATIVE: Hep B S AB Quant (Post): 16.6 m[IU]/mL (ref >9.9)

## 2021-10-05 LAB — HEPARIN LEVEL (UNFRACTIONATED)
Heparin Unfractionated: 0.67 IU/mL (ref 0.30–0.70)
Heparin Unfractionated: 0.53 IU/mL (ref 0.30–0.70)

## 2021-10-05 NOTE — Progress Notes (Signed)
[facility] ?[place], [place] ?10/05/21 ? ?Subjective:  ? ?Hospital day # 3 ? ?Patient known to our practice from previous admissions  ?This time patient presents for right lower extremity pain from the wound. ?Treated with broad-spectrum antibiotics including cefepime, metronidazole and vancomycin in the emergency room. ?Admitted for wound evaluation by surgical services and IV antibiotics. ? ?Patient seen sitting at side of bed ?Continues to complain of bilateral lower extremity pain ?Voices frustration of not knowing treatment plan to manage his leg wounds. ? ? ?Objective:  ?Vital signs in last 24 hours:  ?Temp:  [97.7 ?F (36.5 ?C)-98.6 ?F (37 ?C)] 98.4 ?F (36.9 ?C) (03/28 0802) ?Pulse Rate:  [32-110] 79 (03/28 0802) ?Resp:  [14-21] 18 (03/28 0802) ?BP: (122-137)/(64-110) 122/74 (03/28 0802) ?SpO2:  [89 %-96 %] 96 % (03/28 0802) ?Weight:  [124.9 kg] 124.9 kg (03/27 1338) ? ?Weight change:  ?Filed Weights  ? 10/02/21 2250 10/04/21 0957 10/04/21 1338  ?Weight: 123 kg 127.9 kg 124.9 kg  ? ? ?Intake/Output: ?  ? ?Intake/Output Summary (Last 24 hours) at 10/05/2021 1235 ?Last data filed at 10/05/2021 0600 ?Gross per 24 hour  ?Intake 832.92 ml  ?Output 2412 ml  ?Net -1579.08 ml  ? ? ? ? ?Physical Exam: ?General: NAD, sitting up   ?HEENT Anicteric, moist oral mucous membranes  ?Pulm/lungs Normal breathing effort on room air  ?CVS/Heart No rub or gallop  ?Abdomen:  Soft  ?Extremities: 2-3+ massive pitting edema bilateral lower extremities, bilateral gauze leg wraps  ?Neurologic: Alert oriented  ?Skin: skin changes from chronic edema bilaterally  ?Access: Left upper arm AV fistula, good thrill  ?   ? ? ?Basic Metabolic Panel: ? ?Recent Labs  ?Lab 10/01/21 ?2125 10/02/21 ?0212 10/04/21 ?0522  ?NA 136 134* 136  ?K 4.5 4.5 5.5*  ?CL 96* 95* 98  ?CO2 27 26 22   ?GLUCOSE 89 118* 90  ?BUN 45* 47* 71*  ?CREATININE 6.79* 7.21* 9.58*  ?CALCIUM 8.4* 8.5* 8.2*  ? ? ? ? ?CBC: ?Recent Labs  ?Lab 10/01/21 ?2125  10/02/21 ?0212 10/03/21 ?0348 10/04/21 ?0522 10/05/21 ?4627  ?WBC 8.0 7.2 7.4 7.1 7.2  ?HGB 8.0* 8.6* 8.1* 7.7* 7.9*  ?HCT 24.8* 26.1* 25.2* 23.8* 24.6*  ?MCV 85.5 85.0 86.3 85.6 86.6  ?PLT 161 151 151 142* 130*  ? ? ? ?  ?Lab Results  ?Component Value Date  ? HEPBSAG NON REACTIVE 10/04/2021  ? HEPBSAB Reactive (A) 10/04/2021  ? HEPBIGM Negative 11/15/2018  ? ? ? ? ?Microbiology: ? ?Recent Results (from the past 240 hour(s))  ?Resp Panel by RT-PCR (Flu A&B, Covid) Nasopharyngeal Swab     Status: None  ? Collection Time: 10/01/21  9:41 PM  ? Specimen: Nasopharyngeal Swab; Nasopharyngeal(NP) swabs in vial transport medium  ?Result Value Ref Range Status  ? SARS Coronavirus 2 by RT PCR NEGATIVE NEGATIVE Final  ?  Comment: (NOTE) ?SARS-CoV-2 target nucleic acids are NOT DETECTED. ? ?The SARS-CoV-2 RNA is generally detectable in upper respiratory ?specimens during the acute phase of infection. The lowest ?concentration of SARS-CoV-2 viral copies this assay can detect is ?138 copies/mL. A negative result does not preclude SARS-Cov-2 ?infection and should not be used as the sole basis for treatment or ?other patient management decisions. A negative result may occur with  ?improper specimen collection/handling, submission of specimen other ?than nasopharyngeal swab, presence of viral mutation(s) within the ?areas targeted by this assay, and inadequate number of viral ?copies(<138 copies/mL). A negative result must be combined with ?clinical  observations, patient history, and epidemiological ?information. The expected result is Negative. ? ?Fact Sheet for Patients:  ?EntrepreneurPulse.com.au ? ?Fact Sheet for Healthcare Providers:  ?IncredibleEmployment.be ? ?This test is no t yet approved or cleared by the [place] FDA and  ?has been authorized for detection and/or diagnosis of SARS-CoV-2 by ?FDA under an Emergency Use Authorization (EUA). This EUA will remain  ?in effect (meaning  this test can be used) for the duration of the ?COVID-19 declaration under Section 564(b)(1) of the Act, 21 ?U.S.C.section 360bbb-3(b)(1), unless the authorization is terminated  ?or revoked sooner.  ? ? ?  ? Influenza A by PCR NEGATIVE NEGATIVE Final  ? Influenza B by PCR NEGATIVE NEGATIVE Final  ?  Comment: (NOTE) ?The Xpert Xpress SARS-CoV-2/FLU/RSV plus assay is intended as an aid ?in the diagnosis of influenza from Nasopharyngeal swab specimens and ?should not be used as a sole basis for treatment. Nasal washings and ?aspirates are unacceptable for Xpert Xpress SARS-CoV-2/FLU/RSV ?testing. ? ?Fact Sheet for Patients: ?EntrepreneurPulse.com.au ? ?Fact Sheet for Healthcare Providers: ?IncredibleEmployment.be ? ?This test is not yet approved or cleared by the [place] FDA and ?has been authorized for detection and/or diagnosis of SARS-CoV-2 by ?FDA under an Emergency Use Authorization (EUA). This EUA will remain ?in effect (meaning this test can be used) for the duration of the ?COVID-19 declaration under Section 564(b)(1) of the Act, 21 U.S.C. ?section 360bbb-3(b)(1), unless the authorization is terminated or ?revoked. ? ?Performed at [facility], [place], ?[place] [address] ?  ?MRSA Next Gen by PCR, Nasal     Status: Abnormal  ? Collection Time: 10/02/21  2:55 AM  ? Specimen: Nasal Mucosa; Nasal Swab  ?Result Value Ref Range Status  ? MRSA by PCR Next Gen DETECTED (A) NOT DETECTED Final  ?  Comment: RESULT CALLED TO, READ BACK BY AND VERIFIED WITH: ?MELISSA COBB AT 0422 10/02/21.PMF ?(NOTE) ?The GeneXpert MRSA Assay (FDA approved for NASAL specimens only), ?is one component of a comprehensive MRSA colonization surveillance ?program. It is not intended to diagnose MRSA infection nor to guide ?or monitor treatment for MRSA infections. ?Test performance is not FDA approved in patients less than 2 years ?old. ?Performed at [facility], [place], ?[place] [address] ?  ? ? ?Coagulation Studies: ?No results for input(s): LABPROT, INR in the last 72 hours. ? ?Urinalysis: ?No results for input(s): COLORURINE, LABSPEC, [place], GLUCOSEU, HGBUR, BILIRUBINUR, KETONESUR, PROTEINUR, UROBILINOGEN, NITRITE, LEUKOCYTESUR in the last 72 hours. ? ?Invalid input(s): APPERANCEUR  ? ? ?Imaging: ?No results found. ? ? ?Medications:  ? ? sodium chloride Stopped (10/04/21 0935)  ? ceFEPime (MAXIPIME) IV 1 g (10/04/21 2100)  ? heparin 1,750 Units/hr (10/04/21 1936)  ? metronidazole 500 mg (10/05/21 8366)  ? vancomycin Stopped (10/04/21 1331)  ? ? atorvastatin  40 mg Oral QHS  ? bumetanide  1 mg Oral 2 times per day on Sun Tue Thu Sat  ? carvedilol  6.25 mg Oral BID  ? Chlorhexidine Gluconate Cloth  6 each Topical Q0600  ? epoetin (EPOGEN/PROCRIT) injection  4,000 Units Intravenous Q M,W,F-HD  ? mupirocin ointment  1 application. Nasal BID  ? oxyCODONE  20 mg Oral Q12H  ? pantoprazole  80 mg Oral Daily  ? pregabalin  25 mg Oral QHS  ? tamsulosin  0.4 mg Oral Daily  ? ?sodium chloride, albuterol, HYDROmorphone (DILAUDID) injection, labetalol, morphine injection, oxyCODONE, polyethylene glycol ? ?Assessment/ Plan:  ?66 y.o. male with end-stage renal  disease, hypertension, atrial fibrillation, coronary artery disease, pulmonary embolism, hyperlipidemia, polysubstance abuse, history of chronic pain syndrome ?Admission at [facility] for acute respiratory failure, COVID-19  in January 2023 ?This time he is admitted on 10/01/2021 for ?Right leg pain [M79.604] ?Cellulitis of right lower extremity [L03.115] ?Multiple open wounds of lower leg [S81.809A] ?Multiple open wounds of left lower leg [S81.802A] ? ?Outpatient hemodialysis: [facility] dialysis.  240 minutes.  Left arm AV fistula.  15-gauge needles.  3 times per week.  MWF.  Target weight 120 kg. ? ?#End-stage renal disease with LE edema ?Patient had hemodialysis on Friday ?Baseline weight 120 kg.  In  hospital 123 kg. ?Dialysis performed yesterday, UF goal reduced to 2.3 L due to cramping.  Next treatment scheduled for Wednesday. ? ? ?#Anemia of chronic kidney disease ?Lab Results  ?Component Value Date  ? HGB 7.9

## 2021-10-05 NOTE — Plan of Care (Signed)
  Problem: Activity: Goal: Risk for activity intolerance will decrease Outcome: Not Progressing   Problem: Elimination: Goal: Will not experience complications related to bowel motility Outcome: Not Progressing   Problem: Pain Managment: Goal: General experience of comfort will improve Outcome: Not Progressing   Problem: Safety: Goal: Ability to remain free from injury will improve Outcome: Not Progressing   

## 2021-10-05 NOTE — Progress Notes (Signed)
?  Progress Note ? ? ?Patient: [name] DOB: [birth year] DOA: 10/01/2021     3 ?DOS: the patient was seen and examined on 10/05/2021 ?  ?Brief hospital course: ?[name] is a 66 year old male with end-stage renal disease on hemodialysis, hypertension, hyperlipidemia, persistent atrial fibrillation on anticoagulation with Eliquis, GERD, BPH, chronic pain syndrome, history of noncompliance with hemodialysis, who presents emergency department for chief concerns of right lower extremity leg pain. ? ?Vitals in the emergency department showed temperature of 98.3, respiration rate of 20, heart rate of 116 and improved to 55, blood pressure 156/92, SPO2 of 93% on room air. ? ?Serum sodium 136, potassium 4.5, chloride 96, bicarb 27, BUN 45, serum creatinine of 6.79, nonfasting blood glucose 89, GFR of 8, WBC 8, hemoglobin 8, platelets of 161. ? ?Lactic acid is 1.1. ? ?ED treatment: Cefepime 2 g IV one-time dose, metronidazole 500 mg IV one-time dose, vancomycin per pharmacy.  Oxycodone 5-325 p.o. one-time dose, morphine 4 mg IV every 3 hours as needed for moderate pain ordered by EDP. ? ?Assessment and Plan: ?* Multiple open wounds of lower leg ?- Severe breakdown of at least epidermis ?- Images documented in [employer] ?- Cefepime and vancomycin per pharmacy ?- Metronidazole 500 mg IV twice daily for anaerobic coverage ?- Continued home analgesia as tolerated for pain control ?- Vascular Surgery consulted with plans for debridement and aggressive multilayer compression therapy. Eliquis now held in anticipation for procedure this week ? ?AF (paroxysmal atrial fibrillation) ([place]) ?- Eliquis changed to heparin gtt in anticipation for upcoming wound debridement ? ?Chronic anticoagulation ?-Vascular Surgery following - recommendation to hold further eliquis in anticipation for debridement this week ?-Continue heparin gtt ? ?ESRD on dialysis [facility]) ?- nephrology following. Pt undergoes MWF HD ?- Cont  with scheduled HD as tolerated ? ?Hypertension ?- Resumed home carvedilol 6.25 mg p.o. twice daily ?- Resumed home Bumex 1 mg, p.o., On nonhemodialysis today  ?-BP stable and controlled ?- Labetalol injection 5 mg IV every 3 hours.  For SBP greater than 180, 3 doses ordered ? ?HLD (hyperlipidemia) ?- Atorvastatin 40 mg nightly resumed ? ? ? ? ?  ? ?Subjective: Without complaints this AM. Denies sob ? ?Physical Exam: ?Vitals:  ? 10/05/21 0053 10/05/21 0509 10/05/21 0600 10/05/21 0802  ?BP: 137/80 125/79  122/74  ?Pulse: (!) 57 (!) 110  79  ?Resp: 18 18  18   ?Temp: 97.7 ?F (36.5 ?C) 98.6 ?F (37 ?C)  98.4 ?F (36.9 ?C)  ?TempSrc: Oral Oral  Oral  ?SpO2: 92% (!) 89% 96% 96%  ?Weight:      ?Height:      ? ?General exam: Conversant, in no acute distress ?Respiratory system: normal chest rise, clear, no audible wheezing ?Cardiovascular system: regular rhythm, s1-s2 ?Gastrointestinal system: Nondistended, nontender, pos BS ?Central nervous system: No seizures, no tremors ?Extremities: BLE edema, dressings in place ?Skin: No rashes, no pallor ?Psychiatry: Affect normal // no auditory hallucinations  ? ?Data Reviewed: ? ?Hgb 7.9 ? ?Family Communication: Pt in room, family not at bedside ? ?Disposition: ?Status is: Inpatient ?Continue inpatient stay because: Severity of illness ? Planned Discharge Destination: Home ? ? ? ?Author: ?Marylu Lund, MD ?10/05/2021 3:47 PM ? ?For on call review [email or web address].  ?

## 2021-10-05 NOTE — Consult Note (Signed)
ANTICOAGULATION CONSULT NOTE ? ?Pharmacy Consult for Heparin infusion ?Indication: atrial fibrillation; hx of PE on eliquis ? ?Patient Measurements: ?Height: 6' (182.9 cm) ?Weight: 124.9 kg (275 lb 5.7 oz) ?IBW/kg (Calculated) : 77.6 ?Heparin Dosing Weight: 104.6 kg  ? ?Vital Signs: ?Temp: 98.4 ?F (36.9 ?C) (03/28 0802) ?Temp Source: Oral (03/28 0802) ?BP: 122/74 (03/28 0802) ?Pulse Rate: 79 (03/28 0802) ? ?Labs: ?Recent Labs  ?  10/03/21 ?0348 10/03/21 ?1216 10/03/21 ?2215 10/04/21 ?0522 10/05/21 ?9357  ?HGB 8.1*  --   --  7.7* 7.9*  ?HCT 25.2*  --   --  23.8* 24.6*  ?PLT 151  --   --  142* 130*  ?APTT 76*   < > 82* 93* 103*  ?HEPARINUNFRC >1.10*  --   --  0.86* 0.67  ?CREATININE  --   --   --  9.58*  --   ? < > = values in this interval not displayed.  ? ? ? ?Estimated Creatinine Clearance: 10.8 mL/min (A) (by C-G formula based on SCr of 9.58 mg/dL (H)). ? ? ?Medical History: ?Past Medical History:  ?Diagnosis Date  ? Depression   ? Diastolic dysfunction   ? a. 09/2019 Echo: EF 55-60%, no rwma, mod LVH, gr2 DD. Nl RV size/fxn. Mildly dil LA. Ao root 4.5cm.  ? Dilated aortic root ([place])   ? a. 09/2019 Echo: Ao root 4.5cm.  ? Elevated troponin level not due myocardial infarction   ? ESRD (end stage renal disease) ([place])   ? Hypertension   ? Nonobstructive Coronary Artery Disease   ? a. 09/2019 Cath: LM nl, LAD min irregs, D1 nl, D2 min irregs, LCX large, min irregs, RCA large, 20p, 30p/m.  ? PE (pulmonary embolism)   ? Renal insufficiency   ? ? ?Medications:  ?Eliquis 5 mg BID: last dose 3/25 0900 ? ?Assessment: ?66 y.o. male with severe bilateral lower extremity venous stasis ulceration. Patient on eliquis PTA for Afib, hx of DVT. held eliquis for possible surgical intervention of wounds. transitioned to heparin infusion ? ?Goal of Therapy:  ?Heparin level 0.3-0.7 units/ml ?Monitor platelets by anticoagulation protocol: Yes ? ?3/26 0348 aPTT 76, therapeutic x 1, HL > 1.1 ?3/26 1216 aPTT 60, subtherapeutic ?3/26 2215  aPTT 82, therapeutic x 1 ?3/27 0522 aPTT 93, therapeutic x 2, HL 0.86 ?3/28 0551 aPTT 103, slightly supratherapeutic, HL 0.67 therapeutic ?3/28 1502 HL 0.53 ?  ?Plan:  ?Heparin level remains therapeutic: this is the second consecutive therapeutic level ?Continue heparin infusion at 1750 units/hr  ?Re-check heparin level once daily with am labs: next 03/29 am  ?Daily CBC while on heparin ? ?Dallie Piles, PharmD ?Clinical Pharmacist  ?10/05/2021 3:12 PM  ? ? ? ? ? ? ?

## 2021-10-05 NOTE — Care Management Important Message (Signed)
Important Message ? ?Patient Details  ?Name: [name] ?MRN: [id number] ?Date of Birth: October 20, 1955 ? ? ?Medicare Important Message Given:  Yes ? ? ? ? ?Dannette Barbara ?10/05/2021, 10:46 AM ?

## 2021-10-05 NOTE — Progress Notes (Signed)
Patient complaining  that this nurse has not visited him, reminded him of bedside report with outgoing nurse Danae Chen, medication pass done at [phone] and that pain medicine was given at that time. Patient states he has not gotten any pain medicine, reminded him that we even had a conversation as he was demanding his IV morphine instead of scheduled oxycodone and lyrica in which this nurse told him he should get his scheduled medicine first before getting any PRN  that made him upset at that moment. Of note, patient has been visited several times as he has called multiple times since 1930.he was noted to be dozing off at the edge of the bed multiple times by different staff and will call once he is awaken demanding for blanket or pain medicine. ?

## 2021-10-05 NOTE — TOC Progression Note (Signed)
Transition of Care (TOC) - Progression Note  ? ? ?Patient Details  ?Name: [name] ?MRN: [id number] ?Date of Birth: 1955/10/30 ? ?Transition of Care (TOC) CM/SW Contact  ?Beverly Sessions, RN ?Phone Number: ?10/05/2021, 1:43 PM ? ?Clinical Narrative:    ? ?Spoke with Letta Median at [facility].  Patient open with RN.  PT was discontinued on 2/19.  [facility] need resumption orders faxed to [phone] ? ?Expected Discharge Plan: Home/Self Care ?Barriers to Discharge: Continued Medical Work up ? ?Expected Discharge Plan and Services ?Expected Discharge Plan: Home/Self Care ?  ?  ?  ?Living arrangements for the past 2 months: [place] ?                ?  ?  ?  ?  ?  ?  ?  ?  ?  ?  ? ? ?Social Determinants of Health (SDOH) Interventions ?  ? ?Readmission Risk Interventions ? ?  10/03/2021  ?  1:48 PM  ?Readmission Risk Prevention Plan  ?Transportation Screening Complete  ?Medication Review Press photographer) Complete  ?PCP or Specialist appointment within 3-5 days of discharge Complete  ?[place] or Home Care Consult Complete  ?Palliative Care Screening Not Applicable  ?[place] Not Applicable  ? ? ?

## 2021-10-05 NOTE — Consult Note (Signed)
ANTICOAGULATION CONSULT NOTE ? ?Pharmacy Consult for Heparin infusion ?Indication: atrial fibrillation; hx of PE on eliquis ? ?Allergies  ?Allergen Reactions  ? Cyclobenzaprine Nausea Only  ? Amlodipine   ? Clonidine Other (See Comments)  ?  Asymptomatic bradycardia to 38  ? Furosemide Other (See Comments)  ?  Caused renal failure  ? Tylenol [Acetaminophen] Other (See Comments)  ?  Reaction:  Pt states it bothers his liver  ? ? ?Patient Measurements: ?Height: 6' (182.9 cm) ?Weight: 124.9 kg (275 lb 5.7 oz) ?IBW/kg (Calculated) : 77.6 ?Heparin Dosing Weight: 104.6 kg  ? ?Vital Signs: ?Temp: 98.6 ?F (37 ?C) (03/28 0509) ?Temp Source: Oral (03/28 0509) ?BP: 125/79 (03/28 0509) ?Pulse Rate: 110 (03/28 0509) ? ?Labs: ?Recent Labs  ?  10/03/21 ?0348 10/03/21 ?1216 10/03/21 ?2215 10/04/21 ?0522 10/05/21 ?1829  ?HGB 8.1*  --   --  7.7* 7.9*  ?HCT 25.2*  --   --  23.8* 24.6*  ?PLT 151  --   --  142* 130*  ?APTT 76*   < > 82* 93* 103*  ?HEPARINUNFRC >1.10*  --   --  0.86* 0.67  ?CREATININE  --   --   --  9.58*  --   ? < > = values in this interval not displayed.  ? ? ? ?Estimated Creatinine Clearance: 10.8 mL/min (A) (by C-G formula based on SCr of 9.58 mg/dL (H)). ? ? ?Medical History: ?Past Medical History:  ?Diagnosis Date  ? Depression   ? Diastolic dysfunction   ? a. 09/2019 Echo: EF 55-60%, no rwma, mod LVH, gr2 DD. Nl RV size/fxn. Mildly dil LA. Ao root 4.5cm.  ? Dilated aortic root ([place])   ? a. 09/2019 Echo: Ao root 4.5cm.  ? Elevated troponin level not due myocardial infarction   ? ESRD (end stage renal disease) ([place])   ? Hypertension   ? Nonobstructive Coronary Artery Disease   ? a. 09/2019 Cath: LM nl, LAD min irregs, D1 nl, D2 min irregs, LCX large, min irregs, RCA large, 20p, 30p/m.  ? PE (pulmonary embolism)   ? Renal insufficiency   ? ? ?Medications:  ?Eliquis 5 mg BID: last dose 3/25 0900 ? ?Assessment: ?66 y.o. male with severe bilateral lower extremity venous stasis ulceration. Patient on eliquis PTA for Afib,  hx of DVT. Plan to hold eliquis for possible surgical intervention of wounds. Will transition to heparin infusion ? ?Goal of Therapy:  ?Heparin level 0.3-0.7 units/ml ?aPTT 66-102 seconds ?Monitor platelets by anticoagulation protocol: Yes ? ?3/26 0348 aPTT 76, therapeutic x 1, HL > 1.1 ?3/26 1216 aPTT 60, subtherapeutic ?3/26 2215 aPTT 82, therapeutic x 1 ?3/27 0522 aPTT 93, therapeutic x 2, HL 0.86 ?3/28 0551 aPTT 103, slightly supratherapeutic, HL 0.67 therapeutic ?  ?Plan:  ?Continue heparin infusion at 1750 units/hr  ?Will now monitor using HL. Re-check HL in 8 hours to confirm ?Daily CBC while on heparin ? ?Sherilyn Banker, PharmD ?Clinical Pharmacist  ?10/05/2021 7:45 AM  ? ? ? ? ? ? ?

## 2021-10-06 ENCOUNTER — Encounter: Payer: Self-pay | Admitting: Internal Medicine

## 2021-10-06 ENCOUNTER — Inpatient Hospital Stay: Payer: Medicare Other

## 2021-10-06 DIAGNOSIS — Z992 Dependence on renal dialysis: Secondary | ICD-10-CM

## 2021-10-06 DIAGNOSIS — N186 End stage renal disease: Secondary | ICD-10-CM | POA: Diagnosis not present

## 2021-10-06 DIAGNOSIS — I480 Paroxysmal atrial fibrillation: Secondary | ICD-10-CM | POA: Diagnosis not present

## 2021-10-06 DIAGNOSIS — S81809A Unspecified open wound, unspecified lower leg, initial encounter: Secondary | ICD-10-CM

## 2021-10-06 DIAGNOSIS — D638 Anemia in other chronic diseases classified elsewhere: Secondary | ICD-10-CM | POA: Diagnosis not present

## 2021-10-06 LAB — RENAL FUNCTION PANEL
Albumin: 2.7 g/dL — ABNORMAL LOW (ref 3.5–5.0)
Anion gap: 12 (ref 5–15)
BUN: 55 mg/dL — ABNORMAL HIGH (ref 8–23)
CO2: 25 mmol/L (ref 22–32)
Calcium: 8.7 mg/dL — ABNORMAL LOW (ref 8.9–10.3)
Chloride: 97 mmol/L — ABNORMAL LOW (ref 98–111)
Creatinine, Ser: 7.89 mg/dL — ABNORMAL HIGH (ref 0.61–1.24)
GFR, Estimated: 7 mL/min — ABNORMAL LOW (ref >60)
Glucose, Bld: 87 mg/dL (ref 70–99)
Phosphorus: 7.5 mg/dL — ABNORMAL HIGH (ref 2.5–4.6)
Potassium: 4.9 mmol/L (ref 3.5–5.1)
Sodium: 134 mmol/L — ABNORMAL LOW (ref 135–145)

## 2021-10-06 LAB — CBC
HCT: 23.5 % — ABNORMAL LOW (ref 39.0–52.0)
Hemoglobin: 7.8 g/dL — ABNORMAL LOW (ref 13.0–17.0)
MCH: 28.3 pg (ref 26.0–34.0)
MCHC: 33.2 g/dL (ref 30.0–36.0)
MCV: 85.1 fL (ref 80.0–100.0)
Platelets: 137 10*3/uL — ABNORMAL LOW (ref 150–400)
RBC: 2.76 MIL/uL — ABNORMAL LOW (ref 4.22–5.81)
RDW: 16.4 % — ABNORMAL HIGH (ref 11.5–15.5)
WBC: 6.3 10*3/uL (ref 4.0–10.5)
nRBC: 0 % (ref 0.0–0.2)

## 2021-10-06 LAB — HEPARIN LEVEL (UNFRACTIONATED): Heparin Unfractionated: 0.53 IU/mL (ref 0.30–0.70)

## 2021-10-06 MED ORDER — EPOETIN ALFA 4000 UNIT/ML IJ SOLN
INTRAMUSCULAR | Status: AC
  Filled 2021-10-06: qty 1

## 2021-10-06 MED ORDER — MORPHINE SULFATE (PF) 2 MG/ML IV SOLN
INTRAVENOUS | Status: AC
  Administered 2021-10-06: 4 mg via INTRAVENOUS_CENTRAL
  Filled 2021-10-06: qty 2

## 2021-10-06 NOTE — Consult Note (Signed)
ANTICOAGULATION CONSULT NOTE ? ?Pharmacy Consult for Heparin infusion ?Indication: atrial fibrillation; hx of PE on eliquis ? ?Patient Measurements: ?Height: 6' (182.9 cm) ?Weight: 124.9 kg (275 lb 5.7 oz) ?IBW/kg (Calculated) : 77.6 ?Heparin Dosing Weight: 104.6 kg  ? ?Vital Signs: ?Temp: 98.4 ?F (36.9 ?C) (03/29 8882) ?Temp Source: Oral (03/29 8003) ?BP: 122/79 (03/29 0438) ?Pulse Rate: 68 (03/29 0438) ? ?Labs: ?Recent Labs  ?  10/03/21 ?2215 10/04/21 ?0522 10/04/21 ?0522 10/05/21 ?4917 10/05/21 ?1502 10/06/21 ?0435  ?HGB  --  7.7*   < > 7.9*  --  7.8*  ?HCT  --  23.8*  --  24.6*  --  23.5*  ?PLT  --  142*  --  130*  --  137*  ?APTT 82* 93*  --  103*  --   --   ?HEPARINUNFRC  --  0.86*   < > 0.67 0.53 0.53  ?CREATININE  --  9.58*  --   --   --  7.89*  ? < > = values in this interval not displayed.  ? ? ? ?Estimated Creatinine Clearance: 13.1 mL/min (A) (by C-G formula based on SCr of 7.89 mg/dL (H)). ? ? ?Medical History: ?Past Medical History:  ?Diagnosis Date  ? Depression   ? Diastolic dysfunction   ? a. 09/2019 Echo: EF 55-60%, no rwma, mod LVH, gr2 DD. Nl RV size/fxn. Mildly dil LA. Ao root 4.5cm.  ? Dilated aortic root ([place])   ? a. 09/2019 Echo: Ao root 4.5cm.  ? Elevated troponin level not due myocardial infarction   ? ESRD (end stage renal disease) ([place])   ? Hypertension   ? Nonobstructive Coronary Artery Disease   ? a. 09/2019 Cath: LM nl, LAD min irregs, D1 nl, D2 min irregs, LCX large, min irregs, RCA large, 20p, 30p/m.  ? PE (pulmonary embolism)   ? Renal insufficiency   ? ? ?Medications:  ?Eliquis 5 mg BID: last dose 3/25 0900 ? ?Assessment: ?66 y.o. male with severe bilateral lower extremity venous stasis ulceration. Patient on eliquis PTA for Afib, hx of DVT. held eliquis for possible surgical intervention of wounds. transitioned to heparin infusion ? ?Goal of Therapy:  ?Heparin level 0.3-0.7 units/ml ?Monitor platelets by anticoagulation protocol: Yes ? ?3/26 0348 aPTT 76, therapeutic x 1, HL >  1.1 ?3/26 1216 aPTT 60, subtherapeutic ?3/26 2215 aPTT 82, therapeutic x 1 ?3/27 0522 aPTT 93, therapeutic x 2, HL 0.86 ?3/28 0551 aPTT 103, slightly supratherapeutic, HL 0.67 therapeutic ?3/28 1502 HL 0.53 ?3/29 0435 HL 0.53, therapeutic X 3  ?  ?Plan:  ?3/29:  HL @ 0435 = 0.53, therapeutic X 3 ?Will continue pt on current rate and recheck HL on 3/30 with AM labs.  ? ?Johncarlos Holtsclaw D, PharmD ?Clinical Pharmacist  ?10/06/2021 6:12 AM  ? ? ? ? ? ? ?

## 2021-10-06 NOTE — Progress Notes (Addendum)
? ? ? ?Progress Note  ? ? ?[name]  QJF:[id number] DOB: [birth year]  DOA: 10/01/2021 ?PCP: [facility]  ? ? ? ? ?Brief Narrative:  ? ? ?Medical records reviewed and are as summarized below: ? ? ?[name] is a 66 year old male with end-stage renal disease on hemodialysis, hypertension, hyperlipidemia, persistent atrial fibrillation on anticoagulation with Eliquis, GERD, BPH, chronic pain syndrome, history of noncompliance with hemodialysis, who presents emergency department for chief concerns of right lower extremity leg pain. ? ?Vitals in the emergency department showed temperature of 98.3, respiration rate of 20, heart rate of 116 and improved to 55, blood pressure 156/92, SPO2 of 93% on room air. ? ?Serum sodium 136, potassium 4.5, chloride 96, bicarb 27, BUN 45, serum creatinine of 6.79, nonfasting blood glucose 89, GFR of 8, WBC 8, hemoglobin 8, platelets of 161. ? ?Lactic acid is 1.1. ? ?ED treatment: Cefepime 2 g IV one-time dose, metronidazole 500 mg IV one-time dose, vancomycin per pharmacy.  Oxycodone 5-325 p.o. one-time dose, morphine 4 mg IV every 3 hours as needed for moderate pain ordered by EDP. ? ? ? ? ? ?Assessment/Plan:  ? ?Principal Problem: ?  Multiple open wounds of lower leg ?Active Problems: ?  HLD (hyperlipidemia) ?  Hypertension ?  ESRD on dialysis [facility]) ?  Anemia of chronic disease ?  CAD (coronary artery disease) ?  Chronic anticoagulation ?  AF (paroxysmal atrial fibrillation) ([place]) ?  Multiple open wounds of left lower leg ? ? ? ? ?Body mass index is 36.33 kg/m?.  (Obesity) ? ?Multiple open wounds of the lower legs: Continue empiric IV antibiotics.  Plan for surgical debridement by vascular surgeon.  Eliquis is on hold for anticipated surgery. ? ?Paroxysmal atrial fibrillation, CAD: Continue IV heparin and monitor heparin level per protocol.  Eliquis is on hold. ? ?ESRD, anemia of chronic kidney disease: Continue Epogen.  Follow-up with nephrologist for  hemodialysis ? ?Other comorbidities include hypertension, hyperlipidemia ? ? ? ? ?Diet Order   ? ?       ?  Diet renal with fluid restriction Fluid restriction: 1200 mL Fluid; Room service appropriate? Yes; Fluid consistency: Thin  Diet effective now       ?  ? ?  ?  ? ?  ? ? ? ? ? ? ?Consultants: ?Vascular surgeon ? ?Procedures: ?None ? ? ? ?Medications:  ? ? atorvastatin  40 mg Oral QHS  ? bumetanide  1 mg Oral 2 times per day on Sun Tue Thu Sat  ? carvedilol  6.25 mg Oral BID  ? Chlorhexidine Gluconate Cloth  6 each Topical Q0600  ? epoetin alfa      ? epoetin (EPOGEN/PROCRIT) injection  4,000 Units Intravenous Q M,W,F-HD  ? mupirocin ointment  1 application. Nasal BID  ? oxyCODONE  20 mg Oral Q12H  ? pantoprazole  80 mg Oral Daily  ? pregabalin  25 mg Oral QHS  ? tamsulosin  0.4 mg Oral Daily  ? ?Continuous Infusions: ? sodium chloride Stopped (10/04/21 0935)  ? ceFEPime (MAXIPIME) IV 1 g (10/05/21 2114)  ? heparin 1,750 Units/hr (10/06/21 0444)  ? metronidazole 500 mg (10/06/21 8937)  ? vancomycin 1,000 mg (10/06/21 1359)  ? ? ? ?Anti-infectives (From admission, onward)  ? ? Start     Dose/Rate Route Frequency Ordered Stop  ? 10/02/21 2200  ceFEPIme (MAXIPIME) 1 g in sodium chloride 0.9 % 100 mL IVPB       ?  1 g ?200 mL/hr over 30 Minutes Intravenous Every 24 hours 10/01/21 2351    ? 10/02/21 0800  metroNIDAZOLE (FLAGYL) IVPB 500 mg       ? 500 mg ?100 mL/hr over 60 Minutes Intravenous Every 12 hours 10/01/21 2341    ? 10/02/21 0000  vancomycin (VANCOCIN) IVPB 1000 mg/200 mL premix       ? 1,000 mg ?200 mL/hr over 60 Minutes Intravenous Every M-W-F (Hemodialysis) 10/01/21 2344 10/11/21 1159  ? 10/01/21 2230  vancomycin (VANCOCIN) IVPB 1000 mg/200 mL premix       ? 1,000 mg ?200 mL/hr over 60 Minutes Intravenous  Once 10/01/21 2121 10/02/21 0039  ? 10/01/21 2115  ceFEPIme (MAXIPIME) 2 g in sodium chloride 0.9 % 100 mL IVPB       ? 2 g ?200 mL/hr over 30 Minutes Intravenous  Once 10/01/21 2105 10/01/21 2214  ?  10/01/21 2115  metroNIDAZOLE (FLAGYL) IVPB 500 mg       ? 500 mg ?100 mL/hr over 60 Minutes Intravenous  Once 10/01/21 2105 10/01/21 2318  ? 10/01/21 2115  vancomycin (VANCOCIN) IVPB 1000 mg/200 mL premix       ? 1,000 mg ?200 mL/hr over 60 Minutes Intravenous  Once 10/01/21 2105 10/01/21 2323  ? ?  ? ? ? ? ? ? ? ? ? ?Family Communication/Anticipated D/C date and plan/Code Status  ? ?DVT prophylaxis:  ? ? ?  Code Status: Full Code ? ?Family Communication: None ?Disposition Plan: Plan to discharge home in 3 to 4 days ? ? ?Status is: Inpatient ?Remains inpatient appropriate because: IV antibiotics, IV heparin drip ? ? ? ? ? ? ?Subjective:  ? ?Interval events noted.  He complains of severe pain in bilateral legs.  His nurses were at the bedside. ? ?Objective:  ? ? ?Vitals:  ? 10/06/21 1400 10/06/21 1415 10/06/21 1430 10/06/21 1437  ?BP: 134/68 131/75 137/78   ?Pulse: 74 79 66   ?Resp: 16 20 17    ?Temp:   98 ?F (36.7 ?C)   ?TempSrc:   Oral   ?SpO2:      ?Weight:    121.5 kg  ?Height:      ? ?No data found. ? ? ?Intake/Output Summary (Last 24 hours) at 10/06/2021 1517 ?Last data filed at 10/06/2021 1437 ?Gross per 24 hour  ?Intake 860.56 ml  ?Output 1099 ml  ?Net -238.44 ml  ? ?Filed Weights  ? 10/04/21 1338 10/06/21 1030 10/06/21 1437  ?Weight: 124.9 kg 121.9 kg 121.5 kg  ? ? ?Exam: ? ?GEN: NAD ?SKIN: Bilateral legs with multiple wounds ?EYES: EOMI ?ENT: MMM ?CV: RRR ?PULM: CTA B ?ABD: soft, obese, NT, +BS ?CNS: AAO x 3, non focal ?EXT: Bilateral lower extremity edema from the thighs to the feet with bilateral leg tenderness ? ? ? ?  ? ? ?Data Reviewed:  ? ?I have personally reviewed following labs and imaging studies: ? ?Labs: ?Labs show the following:  ? ?Basic Metabolic Panel: ?Recent Labs  ?Lab 10/01/21 ?2125 10/02/21 ?0212 10/04/21 ?0522 10/06/21 ?0435  ?NA 136 134* 136 134*  ?K 4.5 4.5 5.5* 4.9  ?CL 96* 95* 98 97*  ?CO2 27 26 22 25   ?GLUCOSE 89 118* 90 87  ?BUN 45* 47* 71* 55*  ?CREATININE 6.79* 7.21* 9.58* 7.89*   ?CALCIUM 8.4* 8.5* 8.2* 8.7*  ?PHOS  --   --   --  7.5*  ? ?GFR ?Estimated Creatinine Clearance: 12.9 mL/min (A) (by C-G formula based on SCr of  7.89 mg/dL (H)). ?Liver Function Tests: ?Recent Labs  ?Lab 10/01/21 ?2125 10/04/21 ?0522 10/06/21 ?0435  ?AST 31 15  --   ?ALT 38 22  --   ?ALKPHOS 173* 135*  --   ?BILITOT 0.9 1.1  --   ?PROT 8.2* 7.4  --   ?ALBUMIN 2.8* 2.8* 2.7*  ? ?No results for input(s): LIPASE, AMYLASE in the last 168 hours. ?No results for input(s): AMMONIA in the last 168 hours. ?Coagulation profile ?No results for input(s): INR, PROTIME in the last 168 hours. ? ?CBC: ?Recent Labs  ?Lab 10/02/21 ?0212 10/03/21 ?0348 10/04/21 ?0522 10/05/21 ?8453 10/06/21 ?0435  ?WBC 7.2 7.4 7.1 7.2 6.3  ?HGB 8.6* 8.1* 7.7* 7.9* 7.8*  ?HCT 26.1* 25.2* 23.8* 24.6* 23.5*  ?MCV 85.0 86.3 85.6 86.6 85.1  ?PLT 151 151 142* 130* 137*  ? ?Cardiac Enzymes: ?No results for input(s): CKTOTAL, CKMB, CKMBINDEX, TROPONINI in the last 168 hours. ?BNP (last 3 results) ?No results for input(s): PROBNP in the last 8760 hours. ?CBG: ?No results for input(s): GLUCAP in the last 168 hours. ?D-Dimer: ?No results for input(s): DDIMER in the last 72 hours. ?Hgb A1c: ?No results for input(s): HGBA1C in the last 72 hours. ?Lipid Profile: ?No results for input(s): CHOL, HDL, LDLCALC, TRIG, CHOLHDL, LDLDIRECT in the last 72 hours. ?Thyroid function studies: ?No results for input(s): TSH, T4TOTAL, T3FREE, THYROIDAB in the last 72 hours. ? ?Invalid input(s): FREET3 ?Anemia work up: ?No results for input(s): VITAMINB12, FOLATE, FERRITIN, TIBC, IRON, RETICCTPCT in the last 72 hours. ?Sepsis Labs: ?Recent Labs  ?Lab 10/01/21 ?2125 10/02/21 ?0212 10/03/21 ?0348 10/04/21 ?0522 10/05/21 ?6468 10/06/21 ?0435  ?WBC 8.0 7.2 7.4 7.1 7.2 6.3  ?LATICACIDVEN 1.1 0.9  --   --   --   --   ? ? ?Microbiology ?Recent Results (from the past 240 hour(s))  ?Resp Panel by RT-PCR (Flu A&B, Covid) Nasopharyngeal Swab     Status: None  ? Collection Time: 10/01/21   9:41 PM  ? Specimen: Nasopharyngeal Swab; Nasopharyngeal(NP) swabs in vial transport medium  ?Result Value Ref Range Status  ? SARS Coronavirus 2 by RT PCR NEGATIVE NEGATIVE Final  ?  Comment: (NOTE) ?SARS-CoV-2

## 2021-10-06 NOTE — Plan of Care (Signed)

## 2021-10-06 NOTE — Progress Notes (Signed)
[facility] ?[place], [place] ?10/06/21 ? ?Subjective:  ? ?Hospital day # 4 ? ?Patient known to our practice from previous admissions  ?This time patient presents for right lower extremity pain from the wound. ?Treated with broad-spectrum antibiotics including cefepime, metronidazole and vancomycin in the emergency room. ?Admitted for wound evaluation by surgical services and IV antibiotics. ? ?Patient seen and evaluated during dialysis ?  ?HEMODIALYSIS FLOWSHEET: ? ?Blood Flow Rate (mL/min): 400 mL/min ?Arterial Pressure (mmHg): -190 mmHg ?Venous Pressure (mmHg): 180 mmHg ?Transmembrane Pressure (mmHg): 50 mmHg ?Ultrafiltration Rate (mL/min): 0 mL/min ?Dialysate Flow Rate (mL/min): 600 ml/min ?Conductivity: Machine : 13.8 ?Conductivity: Machine : 13.8 ?Dialysis Fluid Bolus: Normal Saline ?Bolus Amount (mL): 250 mL ? ?Continues to cry out in relation to bilateral lower extremity pain.  Patient has received all as needed pain medications ? ? ?Objective:  ?Vital signs in last 24 hours:  ?Temp:  [98 ?F (36.7 ?C)-98.4 ?F (36.9 ?C)] 98 ?F (36.7 ?C) (03/29 1030) ?Pulse Rate:  [65-115] 81 (03/29 1215) ?Resp:  [15-23] 15 (03/29 1215) ?BP: (106-140)/(61-114) 117/65 (03/29 1215) ?SpO2:  [96 %-100 %] 98 % (03/29 1030) ?Weight:  [121.9 kg] 121.9 kg (03/29 1030) ? ?Weight change:  ?Filed Weights  ? 10/04/21 0957 10/04/21 1338 10/06/21 1030  ?Weight: 127.9 kg 124.9 kg 121.9 kg  ? ? ?Intake/Output: ?  ? ?Intake/Output Summary (Last 24 hours) at 10/06/2021 1243 ?Last data filed at 10/06/2021 1000 ?Gross per 24 hour  ?Intake 1100.56 ml  ?Output 100 ml  ?Net 1000.56 ml  ? ? ? ? ?Physical Exam: ?General: NAD  ?HEENT Anicteric, moist oral mucous membranes  ?Pulm/lungs Normal breathing effort on room air  ?CVS/Heart No rub or gallop  ?Abdomen:  Soft  ?Extremities: 2+  pitting edema bilateral lower extremities, bilateral gauze leg wraps  ?Neurologic: Alert oriented  ?Skin: skin changes from chronic edema bilaterally   ?Access: Left upper arm AV fistula, good thrill  ?   ? ? ?Basic Metabolic Panel: ? ?Recent Labs  ?Lab 10/01/21 ?2125 10/02/21 ?0212 10/04/21 ?0522 10/06/21 ?0435  ?NA 136 134* 136 134*  ?K 4.5 4.5 5.5* 4.9  ?CL 96* 95* 98 97*  ?CO2 27 26 22 25   ?GLUCOSE 89 118* 90 87  ?BUN 45* 47* 71* 55*  ?CREATININE 6.79* 7.21* 9.58* 7.89*  ?CALCIUM 8.4* 8.5* 8.2* 8.7*  ?PHOS  --   --   --  7.5*  ? ? ? ? ?CBC: ?Recent Labs  ?Lab 10/02/21 ?0212 10/03/21 ?0348 10/04/21 ?0522 10/05/21 ?6010 10/06/21 ?0435  ?WBC 7.2 7.4 7.1 7.2 6.3  ?HGB 8.6* 8.1* 7.7* 7.9* 7.8*  ?HCT 26.1* 25.2* 23.8* 24.6* 23.5*  ?MCV 85.0 86.3 85.6 86.6 85.1  ?PLT 151 151 142* 130* 137*  ? ? ? ?  ?Lab Results  ?Component Value Date  ? HEPBSAG NON REACTIVE 10/04/2021  ? HEPBSAB Reactive (A) 10/04/2021  ? HEPBIGM Negative 11/15/2018  ? ? ? ? ?Microbiology: ? ?Recent Results (from the past 240 hour(s))  ?Resp Panel by RT-PCR (Flu A&B, Covid) Nasopharyngeal Swab     Status: None  ? Collection Time: 10/01/21  9:41 PM  ? Specimen: Nasopharyngeal Swab; Nasopharyngeal(NP) swabs in vial transport medium  ?Result Value Ref Range Status  ? SARS Coronavirus 2 by RT PCR NEGATIVE NEGATIVE Final  ?  Comment: (NOTE) ?SARS-CoV-2 target nucleic acids are NOT DETECTED. ? ?The SARS-CoV-2 RNA is generally detectable in upper respiratory ?specimens during the acute phase of infection. The lowest ?concentration of SARS-CoV-2 viral copies  this assay can detect is ?138 copies/mL. A negative result does not preclude SARS-Cov-2 ?infection and should not be used as the sole basis for treatment or ?other patient management decisions. A negative result may occur with  ?improper specimen collection/handling, submission of specimen other ?than nasopharyngeal swab, presence of viral mutation(s) within the ?areas targeted by this assay, and inadequate number of viral ?copies(<138 copies/mL). A negative result must be combined with ?clinical observations, patient history, and  epidemiological ?information. The expected result is Negative. ? ?Fact Sheet for Patients:  ?EntrepreneurPulse.com.au ? ?Fact Sheet for Healthcare Providers:  ?IncredibleEmployment.be ? ?This test is no t yet approved or cleared by the [place] FDA and  ?has been authorized for detection and/or diagnosis of SARS-CoV-2 by ?FDA under an Emergency Use Authorization (EUA). This EUA will remain  ?in effect (meaning this test can be used) for the duration of the ?COVID-19 declaration under Section 564(b)(1) of the Act, 21 ?U.S.C.section 360bbb-3(b)(1), unless the authorization is terminated  ?or revoked sooner.  ? ? ?  ? Influenza A by PCR NEGATIVE NEGATIVE Final  ? Influenza B by PCR NEGATIVE NEGATIVE Final  ?  Comment: (NOTE) ?The Xpert Xpress SARS-CoV-2/FLU/RSV plus assay is intended as an aid ?in the diagnosis of influenza from Nasopharyngeal swab specimens and ?should not be used as a sole basis for treatment. Nasal washings and ?aspirates are unacceptable for Xpert Xpress SARS-CoV-2/FLU/RSV ?testing. ? ?Fact Sheet for Patients: ?EntrepreneurPulse.com.au ? ?Fact Sheet for Healthcare Providers: ?IncredibleEmployment.be ? ?This test is not yet approved or cleared by the [place] FDA and ?has been authorized for detection and/or diagnosis of SARS-CoV-2 by ?FDA under an Emergency Use Authorization (EUA). This EUA will remain ?in effect (meaning this test can be used) for the duration of the ?COVID-19 declaration under Section 564(b)(1) of the Act, 21 U.S.C. ?section 360bbb-3(b)(1), unless the authorization is terminated or ?revoked. ? ?Performed at [facility], [place], ?[place] [address] ?  ?MRSA Next Gen by PCR, Nasal     Status: Abnormal  ? Collection Time: 10/02/21  2:55 AM  ? Specimen: Nasal Mucosa; Nasal Swab  ?Result Value Ref Range Status  ? MRSA by PCR Next Gen DETECTED (A) NOT DETECTED Final  ?  Comment:  RESULT CALLED TO, READ BACK BY AND VERIFIED WITH: ?MELISSA COBB AT 0422 10/02/21.PMF ?(NOTE) ?The GeneXpert MRSA Assay (FDA approved for NASAL specimens only), ?is one component of a comprehensive MRSA colonization surveillance ?program. It is not intended to diagnose MRSA infection nor to guide ?or monitor treatment for MRSA infections. ?Test performance is not FDA approved in patients less than 2 years ?old. ?Performed at [facility], [place], ?[place] [address] ?  ? ? ?Coagulation Studies: ?No results for input(s): LABPROT, INR in the last 72 hours. ? ?Urinalysis: ?No results for input(s): COLORURINE, LABSPEC, [place], GLUCOSEU, HGBUR, BILIRUBINUR, KETONESUR, PROTEINUR, UROBILINOGEN, NITRITE, LEUKOCYTESUR in the last 72 hours. ? ?Invalid input(s): APPERANCEUR  ? ? ?Imaging: ?No results found. ? ? ?Medications:  ? ? sodium chloride Stopped (10/04/21 0935)  ? ceFEPime (MAXIPIME) IV 1 g (10/05/21 2114)  ? heparin 1,750 Units/hr (10/06/21 0444)  ? metronidazole 500 mg (10/06/21 8341)  ? vancomycin Stopped (10/04/21 1331)  ? ? atorvastatin  40 mg Oral QHS  ? bumetanide  1 mg Oral 2 times per day on Sun Tue Thu Sat  ? carvedilol  6.25 mg Oral BID  ? Chlorhexidine Gluconate Cloth  6 each Topical Q0600  ?  epoetin alfa      ? epoetin (EPOGEN/PROCRIT) injection  4,000 Units Intravenous Q M,W,F-HD  ? mupirocin ointment  1 application. Nasal BID  ? oxyCODONE  20 mg Oral Q12H  ? pantoprazole  80 mg Oral Daily  ? pregabalin  25 mg Oral QHS  ? tamsulosin  0.4 mg Oral Daily  ? ?sodium chloride, albuterol, HYDROmorphone (DILAUDID) injection, labetalol, morphine injection, oxyCODONE, polyethylene glycol ? ?Assessment/ Plan:  ?66 y.o. male with end-stage renal disease, hypertension, atrial fibrillation, coronary artery disease, pulmonary embolism, hyperlipidemia, polysubstance abuse, history of chronic pain syndrome ?Admission at [facility] for acute respiratory failure, COVID-19  in January 2023 ?This time he  is admitted on 10/01/2021 for ?Right leg pain [M79.604] ?Cellulitis of right lower extremity [L03.115] ?Multiple open wounds of lower leg [S81.809A] ?Multiple open wounds of left lower leg [S81.802A] ? ?Outpatient hemodialysis: N

## 2021-10-06 NOTE — Progress Notes (Signed)
Mobility Specialist - Progress Note ? ? 10/06/21 1600  ?Mobility  ?Activity Dangled on edge of bed  ?Level of Assistance Standby assist, set-up cues, supervision of patient - no hands on  ?Assistive Device None  ?Distance Ambulated (ft) 0 ft  ?Activity Response Tolerated well  ?$Mobility charge 1 Mobility  ? ? ? ?Pt transferred from sitting<>supine position with supervision. VC for scoot transfer towards [place]. Pt transported to [place].  ? ? ?Kathee Delton ?Mobility Specialist ?10/06/21, 4:43 PM ? ? ? ? ?

## 2021-10-06 NOTE — Progress Notes (Signed)
Hemodialysis Post Treatment Note: ? ?Tx date:10/06/2021  ?Tx time:3.5hours ?Access:Left AVF ?UF Removed:1liter ? ?Note: ?Experienced cramping in left hand, corrected with uf goal break. Pain medications given during treatment with little relief per patient. Vancomycin given. Hemastasis achieved,sites dressed with gauze/taped. ? ? ? ? ? ? ? ?  ?

## 2021-10-07 ENCOUNTER — Encounter: Payer: Self-pay | Admitting: Internal Medicine

## 2021-10-07 ENCOUNTER — Inpatient Hospital Stay: Payer: Medicare Other | Admitting: Anesthesiology

## 2021-10-07 ENCOUNTER — Encounter
Admission: EM | Disposition: A | Discharge status: Skilled Nursing Facility | Payer: No Typology Code available for payment source | Source: Home / Self Care | Attending: Internal Medicine

## 2021-10-07 ENCOUNTER — Other Ambulatory Visit: Payer: Self-pay

## 2021-10-07 DIAGNOSIS — S81802A Unspecified open wound, left lower leg, initial encounter: Secondary | ICD-10-CM

## 2021-10-07 DIAGNOSIS — D638 Anemia in other chronic diseases classified elsewhere: Secondary | ICD-10-CM | POA: Diagnosis not present

## 2021-10-07 DIAGNOSIS — I480 Paroxysmal atrial fibrillation: Secondary | ICD-10-CM | POA: Diagnosis not present

## 2021-10-07 DIAGNOSIS — S81809A Unspecified open wound, unspecified lower leg, initial encounter: Secondary | ICD-10-CM | POA: Diagnosis not present

## 2021-10-07 DIAGNOSIS — S81801A Unspecified open wound, right lower leg, initial encounter: Secondary | ICD-10-CM

## 2021-10-07 HISTORY — PX: IRRIGATION AND DEBRIDEMENT ABSCESS: SHX5252

## 2021-10-07 LAB — CBC
HCT: 24.8 % — ABNORMAL LOW (ref 39.0–52.0)
Hemoglobin: 8.1 g/dL — ABNORMAL LOW (ref 13.0–17.0)
MCH: 28.1 pg (ref 26.0–34.0)
MCHC: 32.7 g/dL (ref 30.0–36.0)
MCV: 86.1 fL (ref 80.0–100.0)
Platelets: 128 10*3/uL — ABNORMAL LOW (ref 150–400)
RBC: 2.88 MIL/uL — ABNORMAL LOW (ref 4.22–5.81)
RDW: 17 % — ABNORMAL HIGH (ref 11.5–15.5)
WBC: 9.6 10*3/uL (ref 4.0–10.5)
nRBC: 0 % (ref 0.0–0.2)

## 2021-10-07 LAB — BASIC METABOLIC PANEL
Anion gap: 13 (ref 5–15)
BUN: 34 mg/dL — ABNORMAL HIGH (ref 8–23)
CO2: 26 mmol/L (ref 22–32)
Calcium: 8.5 mg/dL — ABNORMAL LOW (ref 8.9–10.3)
Chloride: 94 mmol/L — ABNORMAL LOW (ref 98–111)
Creatinine, Ser: 5.4 mg/dL — ABNORMAL HIGH (ref 0.61–1.24)
GFR, Estimated: 11 mL/min — ABNORMAL LOW (ref >60)
Glucose, Bld: 98 mg/dL (ref 70–99)
Potassium: 4.1 mmol/L (ref 3.5–5.1)
Sodium: 133 mmol/L — ABNORMAL LOW (ref 135–145)

## 2021-10-07 LAB — TYPE AND SCREEN
ABO/RH(D): O POS
Antibody Screen: NEGATIVE

## 2021-10-07 LAB — HCV RT-PCR, QUANT (NON-GRAPH): Hepatitis C Quantitation: NOT DETECTED IU/mL

## 2021-10-07 LAB — HEPARIN LEVEL (UNFRACTIONATED): Heparin Unfractionated: 0.33 IU/mL (ref 0.30–0.70)

## 2021-10-07 LAB — HCV AB W REFLEX TO QUANT PCR: HCV Ab: REACTIVE — AB

## 2021-10-07 SURGERY — IRRIGATION AND DEBRIDEMENT ABSCESS
Anesthesia: General | Site: Leg Lower | Laterality: Bilateral

## 2021-10-07 MED ORDER — LIDOCAINE HCL (PF) 1 % IJ SOLN
INTRAMUSCULAR | Status: AC
  Filled 2021-10-07: qty 30

## 2021-10-07 MED ORDER — HEPARIN (PORCINE) 25000 UT/250ML-% IV SOLN
1750.0000 [IU]/h | INTRAVENOUS | Status: DC
  Filled 2021-10-07: qty 250

## 2021-10-07 MED ORDER — ROCURONIUM BROMIDE 100 MG/10ML IV SOLN
INTRAVENOUS | Status: DC | PRN
  Administered 2021-10-07: 50 mg via INTRAVENOUS

## 2021-10-07 MED ORDER — DEXAMETHASONE SODIUM PHOSPHATE 10 MG/ML IJ SOLN
INTRAMUSCULAR | Status: AC
  Filled 2021-10-07: qty 1

## 2021-10-07 MED ORDER — ROCURONIUM BROMIDE 10 MG/ML (PF) SYRINGE
PREFILLED_SYRINGE | INTRAVENOUS | Status: AC
  Filled 2021-10-07: qty 10

## 2021-10-07 MED ORDER — HYDROMORPHONE HCL 1 MG/ML IJ SOLN
2.0000 mg | INTRAMUSCULAR | Status: DC | PRN
  Administered 2021-10-07 – 2021-10-11 (×26): 2 mg via INTRAVENOUS
  Filled 2021-10-07 (×26): qty 2

## 2021-10-07 MED ORDER — SUGAMMADEX SODIUM 500 MG/5ML IV SOLN
INTRAVENOUS | Status: DC | PRN
  Administered 2021-10-07: 200 mg via INTRAVENOUS

## 2021-10-07 MED ORDER — LACTATED RINGERS IV SOLN
INTRAVENOUS | Status: DC
Start: 2021-10-07 — End: 2021-10-07

## 2021-10-07 MED ORDER — FENTANYL CITRATE (PF) 100 MCG/2ML IJ SOLN
INTRAMUSCULAR | Status: AC
  Filled 2021-10-07: qty 2

## 2021-10-07 MED ORDER — IPRATROPIUM-ALBUTEROL 0.5-2.5 (3) MG/3ML IN SOLN
RESPIRATORY_TRACT | Status: AC
  Filled 2021-10-07: qty 3

## 2021-10-07 MED ORDER — BUPIVACAINE HCL (PF) 0.5 % IJ SOLN
INTRAMUSCULAR | Status: AC
  Filled 2021-10-07: qty 30

## 2021-10-07 MED ORDER — 0.9 % SODIUM CHLORIDE (POUR BTL) OPTIME
TOPICAL | Status: DC | PRN
  Administered 2021-10-07: 1000 mL

## 2021-10-07 MED ORDER — LIDOCAINE HCL (PF) 2 % IJ SOLN
INTRAMUSCULAR | Status: AC
  Filled 2021-10-07: qty 5

## 2021-10-07 MED ORDER — PROPOFOL 10 MG/ML IV BOLUS
INTRAVENOUS | Status: AC
  Filled 2021-10-07: qty 20

## 2021-10-07 MED ORDER — ONDANSETRON HCL 4 MG/2ML IJ SOLN
INTRAMUSCULAR | Status: DC | PRN
  Administered 2021-10-07: 4 mg via INTRAVENOUS

## 2021-10-07 MED ORDER — PHENYLEPHRINE 40 MCG/ML (10ML) SYRINGE FOR IV PUSH (FOR BLOOD PRESSURE SUPPORT)
PREFILLED_SYRINGE | INTRAVENOUS | Status: DC | PRN
  Administered 2021-10-07: 120 ug via INTRAVENOUS
  Administered 2021-10-07: 80 ug via INTRAVENOUS
  Administered 2021-10-07: 120 ug via INTRAVENOUS
  Administered 2021-10-07: 80 ug via INTRAVENOUS

## 2021-10-07 MED ORDER — PROPOFOL 10 MG/ML IV BOLUS
INTRAVENOUS | Status: DC | PRN
  Administered 2021-10-07: 150 mg via INTRAVENOUS

## 2021-10-07 MED ORDER — PHENYLEPHRINE 40 MCG/ML (10ML) SYRINGE FOR IV PUSH (FOR BLOOD PRESSURE SUPPORT)
PREFILLED_SYRINGE | INTRAVENOUS | Status: AC
  Filled 2021-10-07: qty 10

## 2021-10-07 MED ORDER — DEXAMETHASONE SODIUM PHOSPHATE 10 MG/ML IJ SOLN
INTRAMUSCULAR | Status: DC | PRN
  Administered 2021-10-07: 10 mg via INTRAVENOUS

## 2021-10-07 MED ORDER — ONDANSETRON HCL 4 MG/2ML IJ SOLN: 4.0000 mg | Freq: Once | INTRAMUSCULAR | Status: DC | PRN

## 2021-10-07 MED ORDER — IPRATROPIUM-ALBUTEROL 0.5-2.5 (3) MG/3ML IN SOLN
3.0000 mL | RESPIRATORY_TRACT | Status: AC
  Administered 2021-10-07: 3 mL via RESPIRATORY_TRACT

## 2021-10-07 MED ORDER — FENTANYL CITRATE (PF) 100 MCG/2ML IJ SOLN
INTRAMUSCULAR | Status: DC | PRN
  Administered 2021-10-07 (×2): 50 ug via INTRAVENOUS

## 2021-10-07 MED ORDER — FENTANYL CITRATE (PF) 100 MCG/2ML IJ SOLN
25.0000 ug | INTRAMUSCULAR | Status: DC | PRN
  Administered 2021-10-07: 25 ug via INTRAVENOUS

## 2021-10-07 MED ORDER — MIDAZOLAM HCL 2 MG/2ML IJ SOLN
INTRAMUSCULAR | Status: AC
Start: 2021-10-07 — End: ?
  Filled 2021-10-07: qty 2

## 2021-10-07 MED ORDER — LIDOCAINE HCL (CARDIAC) PF 100 MG/5ML IV SOSY
PREFILLED_SYRINGE | INTRAVENOUS | Status: DC | PRN
  Administered 2021-10-07: 100 mg via INTRAVENOUS

## 2021-10-07 MED ORDER — ONDANSETRON HCL 4 MG/2ML IJ SOLN
INTRAMUSCULAR | Status: AC
  Filled 2021-10-07: qty 2

## 2021-10-07 SURGICAL SUPPLY — 30 items
BLADE SURG 15 STRL LF DISP TIS (BLADE) ×1 IMPLANT
BLADE SURG 15 STRL SS (BLADE) ×1
CANISTER WOUND CARE 500ML ATS (WOUND CARE) ×2 IMPLANT
CHLORAPREP W/TINT 26 (MISCELLANEOUS) ×2 IMPLANT
CONNECTOR Y TYPE (MISCELLANEOUS) ×1 IMPLANT
DRAPE LAPAROTOMY 100X77 ABD (DRAPES) ×2 IMPLANT
DRSG VAC ATS LRG SENSATRAC (GAUZE/BANDAGES/DRESSINGS) ×3 IMPLANT
ELECT REM PT RETURN 9FT ADLT (ELECTROSURGICAL) ×2
ELECTRODE REM PT RTRN 9FT ADLT (ELECTROSURGICAL) ×1 IMPLANT
GAUZE 4X4 16PLY ~~LOC~~+RFID DBL (SPONGE) ×2 IMPLANT
GAUZE SPONGE 4X4 12PLY STRL (GAUZE/BANDAGES/DRESSINGS) ×2 IMPLANT
GLOVE SURG SYN 7.0 (GLOVE) ×2 IMPLANT
GLOVE SURG SYN 7.0 PF PI (GLOVE) ×1 IMPLANT
GOWN STRL REUS W/ TWL LRG LVL3 (GOWN DISPOSABLE) ×1 IMPLANT
GOWN STRL REUS W/ TWL XL LVL3 (GOWN DISPOSABLE) ×1 IMPLANT
GOWN STRL REUS W/TWL LRG LVL3 (GOWN DISPOSABLE) ×1
GOWN STRL REUS W/TWL XL LVL3 (GOWN DISPOSABLE) ×1
KIT TURNOVER KIT A (KITS) ×2 IMPLANT
LABEL OR SOLS (LABEL) ×2 IMPLANT
MANIFOLD NEPTUNE II (INSTRUMENTS) ×2 IMPLANT
NDL HYPO 25X1 1.5 SAFETY (NEEDLE) IMPLANT
NEEDLE HYPO 25X1 1.5 SAFETY (NEEDLE) IMPLANT
PACK BASIN MINOR ARMC (MISCELLANEOUS) ×2 IMPLANT
SOL PREP PVP 2OZ (MISCELLANEOUS) ×4
SOLUTION PREP PVP 2OZ (MISCELLANEOUS) ×1 IMPLANT
SUT VIC AB 2-0 CT1 (SUTURE) IMPLANT
SUT VICRYL+ 3-0 36IN CT-1 (SUTURE) IMPLANT
SWAB CULTURE AMIES ANAERIB BLU (MISCELLANEOUS) ×1 IMPLANT
SYR 10ML LL (SYRINGE) IMPLANT
SYR BULB IRRIG 60ML STRL (SYRINGE) ×2 IMPLANT

## 2021-10-07 NOTE — Op Note (Addendum)
? ? ?  OPERATIVE NOTE ? ? ?PROCEDURE: ?Irrigation and debridement of extensive lower extremity wounds bilaterally for approximately 200 cm? with bilateral VAC dressing placements ? ?PRE-OPERATIVE DIAGNOSIS: Nonviable tissue and infection of bilateral lower extremities ? ?POST-OPERATIVE DIAGNOSIS: Same as above ? ?SURGEON: [name], MD ? ?ASSISTANT(S): None ? ?ANESTHESIA: General ? ?ESTIMATED BLOOD LOSS: 5 cc ? ?FINDING(S): ?None ? ?SPECIMEN(S): None ? ?INDICATIONS:   ?[name] is a 66 y.o. male who presents with extensive circumferential wounds worse on the right than the left.  These are in both calves and lower legs.  There is extensive slough and thick scaly skin surrounding the wounds making dressings much more difficult.  He is brought to the operating room to debride this area and try to clean it up and get a VAC on and ultimately he will need Unna boots for several weeks.. ? ?DESCRIPTION: ?After obtaining full informed written consent, the patient was brought back to the operating room and placed supine upon the operating table.  The patient received IV antibiotics prior to induction.  After obtaining adequate anesthesia, the patient was prepped and draped in the standard fashion.  The wound was then opened and excisional debridement was performed to the skin and soft tissue to remove all clearly non-viable tissue.  The tissue was taken back to bleeding tissue that appeared viable.  The debridement was performed with gauze and the back of the Bovie pad as well as Metzenbaum scissors to help remove all the slough and slimy tissue overlying the wounds to exposed some granulation tissue.  There was minimal depth. This is more extensive on the right than the left and encompassed an area of approximately 200 cm2.  The wound was irrigated copiously with saline.  After all clearly non-viable tissue was removed, large VAC sponge was placed on the left leg and 2 large VAC sponges were placed on the right leg.   Strips of [place] were used and occlusive seal was obtained.  It was connected to suction and a good seal was created. ?The patient was then awakened from anesthesia and taken to the recovery room in stable condition having tolerated the procedure well. ? ?COMPLICATIONS: none ? ?CONDITION: stable ? ?[name] ? ?10/07/2021, 3:45 PM ? ? ?This note was created with Dragon Medical transcription system. Any errors in dictation are purely unintentional.  ?

## 2021-10-07 NOTE — Interval H&P Note (Signed)
History and Physical Interval Note: ? ?10/07/2021 ?1:51 PM ? ?[name]  has presented today for surgery, with the diagnosis of Bilateral lower extremity Ulcers.  The various methods of treatment have been discussed with the patient and family. After consideration of risks, benefits and other options for treatment, the patient has consented to  Procedure(s): ?IRRIGATION AND DEBRIDEMENT ABSCESS (Bilateral) as a surgical intervention.  The patient's history has been reviewed, patient examined, no change in status, stable for surgery.  I have reviewed the patient's chart and labs.  Questions were answered to the patient's satisfaction.   ? ? ?[name] ? ? ?

## 2021-10-07 NOTE — Transfer of Care (Signed)
Immediate Anesthesia Transfer of Care Note ? ?Patient: [name] ? ?Procedure(s) Performed: IRRIGATION AND DEBRIDEMENT ABSCESS (Bilateral: Leg Lower) ? ?Patient Location: PACU ? ?Anesthesia Type:General ? ?Level of Consciousness: awake ? ?Airway & Oxygen Therapy: Patient Spontanous Breathing and Patient connected to face mask oxygen ? ?Post-op Assessment: Report given to RN and Post -op Vital signs reviewed and stable ? ?Post vital signs: Reviewed and stable ? ?Last Vitals:  ?Vitals Value Taken Time  ?BP 101/57 10/07/21 1546  ?Temp    ?Pulse 76 10/07/21 1553  ?Resp 19 10/07/21 1553  ?SpO2 97 % 10/07/21 1553  ?Vitals shown include unvalidated device data. ? ?Last Pain:  ?Vitals:  ? 10/07/21 1405  ?TempSrc: Temporal  ?PainSc:   ?   ? ?Patients Stated Pain Goal: 0 (10/07/21 0757) ? ?Complications: No notable events documented. ?

## 2021-10-07 NOTE — Anesthesia Procedure Notes (Signed)
Procedure Name: Intubation ?Date/Time: 10/07/2021 2:53 PM ?Performed by: Aline Brochure, CRNA ?Pre-anesthesia Checklist: Patient identified, Patient being monitored, Timeout performed, Emergency Drugs available and Suction available ?Patient Re-evaluated:Patient Re-evaluated prior to induction ?Oxygen Delivery Method: Circle system utilized ?Preoxygenation: Pre-oxygenation with 100% oxygen ?Induction Type: IV induction ?Ventilation: Mask ventilation without difficulty ?Laryngoscope Size: McGraph and 4 ?Grade View: Grade I ?Tube type: Oral ?Tube size: 7.5 mm ?Number of attempts: 1 ?Airway Equipment and Method: Stylet and Video-laryngoscopy ?Placement Confirmation: ETT inserted through vocal cords under direct vision, positive ETCO2 and breath sounds checked- equal and bilateral ?Secured at: 22 cm ?Tube secured with: Tape ?Dental Injury: Teeth and Oropharynx as per pre-operative assessment  ?Difficulty Due To: Difficulty was anticipated and Difficult Airway- due to dentition ? ? ? ? ?

## 2021-10-07 NOTE — Progress Notes (Addendum)
[facility] ?[place], [place] ?10/07/21 ? ?Subjective:  ? ?Hospital day # 5 ? ?Patient known to our practice from previous admissions  ?This time patient presents for right lower extremity pain from the wound. ?Treated with broad-spectrum antibiotics including cefepime, metronidazole and vancomycin in the emergency room. ?Admitted for wound evaluation by surgical services and IV antibiotics. ? ?Patient currently lying in bed, drowsy ?Denies pain at this time ?Currently n.p.o. for scheduled procedure ? ?Dialysis received yesterday, tolerated well ? ? ?Objective:  ?Vital signs in last 24 hours:  ?Temp:  [97.4 ?F (36.3 ?C)-98.1 ?F (36.7 ?C)] 98.1 ?F (36.7 ?C) (03/30 0757) ?Pulse Rate:  [66-97] 87 (03/30 0757) ?Resp:  [16-20] 20 (03/30 0757) ?BP: (126-151)/(66-88) 136/83 (03/30 0757) ?SpO2:  [95 %-98 %] 96 % (03/30 0757) ?Weight:  [121.5 kg] 121.5 kg (03/29 1437) ? ?Weight change:  ?Filed Weights  ? 10/04/21 1338 10/06/21 1030 10/06/21 1437  ?Weight: 124.9 kg 121.9 kg 121.5 kg  ? ? ?Intake/Output: ?  ? ?Intake/Output Summary (Last 24 hours) at 10/07/2021 1306 ?Last data filed at 10/07/2021 0400 ?Gross per 24 hour  ?Intake 657.48 ml  ?Output 999 ml  ?Net -341.52 ml  ? ? ? ? ?Physical Exam: ?General: NAD  ?HEENT Anicteric, moist oral mucous membranes  ?Pulm/lungs Normal breathing effort on room air  ?CVS/Heart No rub or gallop, irregular  ?Abdomen:  Soft  ?Extremities: 2+  pitting edema bilateral lower extremities, bilateral gauze leg wraps  ?Neurologic: Alert oriented  ?Skin: skin changes from chronic edema bilaterally  ?Access: Left upper arm AV fistula, good thrill  ?   ? ? ?Basic Metabolic Panel: ? ?Recent Labs  ?Lab 10/01/21 ?2125 10/02/21 ?0212 10/04/21 ?0522 10/06/21 ?0435 10/07/21 ?1610  ?NA 136 134* 136 134* 133*  ?K 4.5 4.5 5.5* 4.9 4.1  ?CL 96* 95* 98 97* 94*  ?CO2 27 26 22 25 26   ?GLUCOSE 89 118* 90 87 98  ?BUN 45* 47* 71* 55* 34*  ?CREATININE 6.79* 7.21* 9.58* 7.89* 5.40*  ?CALCIUM 8.4* 8.5*  8.2* 8.7* 8.5*  ?PHOS  --   --   --  7.5*  --   ? ? ? ? ?CBC: ?Recent Labs  ?Lab 10/03/21 ?0348 10/04/21 ?0522 10/05/21 ?9604 10/06/21 ?0435 10/07/21 ?5409  ?WBC 7.4 7.1 7.2 6.3 9.6  ?HGB 8.1* 7.7* 7.9* 7.8* 8.1*  ?HCT 25.2* 23.8* 24.6* 23.5* 24.8*  ?MCV 86.3 85.6 86.6 85.1 86.1  ?PLT 151 142* 130* 137* 128*  ? ? ? ?  ?Lab Results  ?Component Value Date  ? HEPBSAG NON REACTIVE 10/04/2021  ? HEPBSAB Reactive (A) 10/04/2021  ? HEPBIGM Negative 11/15/2018  ? ? ? ? ?Microbiology: ? ?Recent Results (from the past 240 hour(s))  ?Resp Panel by RT-PCR (Flu A&B, Covid) Nasopharyngeal Swab     Status: None  ? Collection Time: 10/01/21  9:41 PM  ? Specimen: Nasopharyngeal Swab; Nasopharyngeal(NP) swabs in vial transport medium  ?Result Value Ref Range Status  ? SARS Coronavirus 2 by RT PCR NEGATIVE NEGATIVE Final  ?  Comment: (NOTE) ?SARS-CoV-2 target nucleic acids are NOT DETECTED. ? ?The SARS-CoV-2 RNA is generally detectable in upper respiratory ?specimens during the acute phase of infection. The lowest ?concentration of SARS-CoV-2 viral copies this assay can detect is ?138 copies/mL. A negative result does not preclude SARS-Cov-2 ?infection and should not be used as the sole basis for treatment or ?other patient management decisions. A negative result may occur with  ?improper specimen collection/handling, submission of specimen other ?than  nasopharyngeal swab, presence of viral mutation(s) within the ?areas targeted by this assay, and inadequate number of viral ?copies(<138 copies/mL). A negative result must be combined with ?clinical observations, patient history, and epidemiological ?information. The expected result is Negative. ? ?Fact Sheet for Patients:  ?EntrepreneurPulse.com.au ? ?Fact Sheet for Healthcare Providers:  ?IncredibleEmployment.be ? ?This test is no t yet approved or cleared by the [place] FDA and  ?has been authorized for detection and/or diagnosis of SARS-CoV-2  by ?FDA under an Emergency Use Authorization (EUA). This EUA will remain  ?in effect (meaning this test can be used) for the duration of the ?COVID-19 declaration under Section 564(b)(1) of the Act, 21 ?U.S.C.section 360bbb-3(b)(1), unless the authorization is terminated  ?or revoked sooner.  ? ? ?  ? Influenza A by PCR NEGATIVE NEGATIVE Final  ? Influenza B by PCR NEGATIVE NEGATIVE Final  ?  Comment: (NOTE) ?The Xpert Xpress SARS-CoV-2/FLU/RSV plus assay is intended as an aid ?in the diagnosis of influenza from Nasopharyngeal swab specimens and ?should not be used as a sole basis for treatment. Nasal washings and ?aspirates are unacceptable for Xpert Xpress SARS-CoV-2/FLU/RSV ?testing. ? ?Fact Sheet for Patients: ?EntrepreneurPulse.com.au ? ?Fact Sheet for Healthcare Providers: ?IncredibleEmployment.be ? ?This test is not yet approved or cleared by the [place] FDA and ?has been authorized for detection and/or diagnosis of SARS-CoV-2 by ?FDA under an Emergency Use Authorization (EUA). This EUA will remain ?in effect (meaning this test can be used) for the duration of the ?COVID-19 declaration under Section 564(b)(1) of the Act, 21 U.S.C. ?section 360bbb-3(b)(1), unless the authorization is terminated or ?revoked. ? ?Performed at [facility], [place], ?[place] [address] ?  ?MRSA Next Gen by PCR, Nasal     Status: Abnormal  ? Collection Time: 10/02/21  2:55 AM  ? Specimen: Nasal Mucosa; Nasal Swab  ?Result Value Ref Range Status  ? MRSA by PCR Next Gen DETECTED (A) NOT DETECTED Final  ?  Comment: RESULT CALLED TO, READ BACK BY AND VERIFIED WITH: ?MELISSA COBB AT 0422 10/02/21.PMF ?(NOTE) ?The GeneXpert MRSA Assay (FDA approved for NASAL specimens only), ?is one component of a comprehensive MRSA colonization surveillance ?program. It is not intended to diagnose MRSA infection nor to guide ?or monitor treatment for MRSA infections. ?Test performance is  not FDA approved in patients less than 2 years ?old. ?Performed at [facility], [place], ?[place] [address] ?  ? ? ?Coagulation Studies: ?No results for input(s): LABPROT, INR in the last 72 hours. ? ?Urinalysis: ?No results for input(s): COLORURINE, LABSPEC, [place], GLUCOSEU, HGBUR, BILIRUBINUR, KETONESUR, PROTEINUR, UROBILINOGEN, NITRITE, LEUKOCYTESUR in the last 72 hours. ? ?Invalid input(s): APPERANCEUR  ? ? ?Imaging: ?US ARTERIAL LOWER EXTREMITY DUPLEX BILATERAL ? ?Result Date: 10/07/2021 ?CLINICAL DATA:  Bilateral lower extremity venous ulcerations. Evaluate arterial circulation prior to debridement. EXAM: BILATERAL LOWER EXTREMITY ARTERIAL DUPLEX SCAN TECHNIQUE: Gray-scale sonography as well as color Doppler and duplex ultrasound was performed to evaluate the arteries of both lower extremities including the common, superficial and profunda femoral arteries, popliteal artery and calf arteries. COMPARISON:  None. FINDINGS: Right Lower Extremity Inflow: Normal common femoral arterial waveforms and velocities. No evidence of inflow (aortoiliac) disease. Outflow: Normal profunda femoral, superficial femoral and popliteal arterial waveforms and velocities. No focal elevation of the PSV to suggest stenosis. Runoff: Abnormal monophasic arterial flow in the posterior tibial artery. Monophasic flow with slow velocities in the distal anterior tibial artery. Left Lower Extremity Inflow: Normal common  femoral arterial waveforms and velocities. No evidence of inflow (aortoiliac) disease. Outflow: Normal profunda femoral, superficial femoral and popliteal arterial waveforms and velocities. No focal elevation of the PSV to suggest stenosis. Runoff: Abnormal monophasic arterial flow in the posterior tibial artery. Monophasic flow with slow velocities in the distal anterior tibial artery. IMPRESSION: 1. Extensive atherosclerotic plaque bilaterally without evidence of hemodynamically significant  stenosis or occlusion to the level of the knees bilaterally. 2. Abnormal monophasic arterial waveforms in the runoff (below the knee) arteries bilaterally. 3. Of note, the bilateral lower extremity duplex

## 2021-10-07 NOTE — Progress Notes (Signed)
[facility] ? ?Daily Progress Note ? ? ?Subjective  -  ? ?Late entry.  Today the patient is sitting comfortably and in good spirits.  He does note painfulness with wound dressing changes but otherwise is well. ? ?Objective ?Vitals:  ? 10/06/21 1415 10/06/21 1430 10/06/21 1437 10/06/21 2025  ?BP: 131/75 137/78  (!) 151/88  ?Pulse: 79 66  97  ?Resp: 20 17  18   ?Temp:  98 ?F (36.7 ?C)  (!) 97.4 ?F (36.3 ?C)  ?TempSrc:  Oral    ?SpO2:    98%  ?Weight:   121.5 kg   ?Height:      ? ? ?Intake/Output Summary (Last 24 hours) at 10/07/2021 0103 ?Last data filed at 10/06/2021 2300 ?Gross per 24 hour  ?Intake 1077.98 ml  ?Output 1099 ml  ?Net -21.02 ml  ? ? ?PULM  CTAB ?CV  RRR ?VASC  bilateral pitting edema bilaterally with weeping ? ?Laboratory ?CBC ?   ?Component Value Date/Time  ? WBC 6.3 10/06/2021 0435  ? HGB 7.8 (L) 10/06/2021 0435  ? HGB 14.3 11/08/2014 1559  ? HCT 23.5 (L) 10/06/2021 0435  ? HCT 42.2 11/08/2014 1559  ? PLT 137 (L) 10/06/2021 0435  ? PLT 231 11/08/2014 1559  ? ? ?BMET ?   ?Component Value Date/Time  ? NA 134 (L) 10/06/2021 0435  ? NA 138 11/08/2014 1559  ? K 4.9 10/06/2021 0435  ? K 3.5 11/08/2014 1559  ? CL 97 (L) 10/06/2021 0435  ? CL 103 11/08/2014 1559  ? CO2 25 10/06/2021 0435  ? CO2 27 11/08/2014 1559  ? GLUCOSE 87 10/06/2021 0435  ? GLUCOSE 143 (H) 11/08/2014 1559  ? BUN 55 (H) 10/06/2021 0435  ? BUN 16 11/08/2014 1559  ? CREATININE 7.89 (H) 10/06/2021 0435  ? CREATININE 1.60 (H) 11/08/2014 1559  ? CALCIUM 8.7 (L) 10/06/2021 0435  ? CALCIUM 9.2 11/08/2014 1559  ? GFRNONAA 7 (L) 10/06/2021 0435  ? GFRNONAA 47 (L) 11/08/2014 1559  ? GFRAA 10 (L) 01/16/2020 1009  ? GFRAA 55 (L) 11/08/2014 1559  ? ? ?Assessment/Planning: ?Venous ulcerations ?The patient has extensive venous ulcerations bilaterally.  Based on the extensiveness of these ulcerations he would benefit from debridement.  We will also have bilateral lower extremity arterial duplexes performed as currently ABIs are not  feasible based on wound location.  This is to ensure that postdebridement the patient will have adequate ability for wound healing.  I discussed the risk, benefits and alternatives and the patient is agreeable to proceed. ? ?Kris Hartmann ? ?10/07/2021, 1:03 AM ? ? ? ?  ?

## 2021-10-07 NOTE — Anesthesia Preprocedure Evaluation (Signed)
Anesthesia Evaluation  ?Patient identified by MRN, date of birth, ID band ?Patient awake ? ? ? ?Reviewed: ?Allergy & Precautions, NPO status , Patient's Chart, lab work & pertinent test results ? ?Airway ?Mallampati: II ? ?TM Distance: >3 FB ?Neck ROM: full ? ? ? Dental ? ?(+) Poor Dentition, Dental Advisory Given ?  ?Pulmonary ?neg pulmonary ROS, sleep apnea , COPD,  COPD inhaler, Current Smoker and Patient abstained from smoking.,  ?  ?Pulmonary exam normal ? ?+ decreased breath sounds ? ? ? ? ? Cardiovascular ?Exercise Tolerance: Poor ?hypertension, Pt. on medications ?+ CAD and +CHF  ?negative cardio ROS ?Normal cardiovascular exam+ dysrhythmias Atrial Fibrillation II ?Rhythm:Irregular Rate:Abnormal ? ? ?  ?Neuro/Psych ?Depression negative neurological ROS ? negative psych ROS  ? GI/Hepatic ?negative GI ROS, Neg liver ROS,   ?Endo/Other  ?negative endocrine ROS ? Renal/GU ?ESRF and DialysisRenal disease  ? ?  ?Musculoskeletal ? ? Abdominal ?  ?Peds ?negative pediatric ROS ?(+)  Hematology ?negative hematology ROS ?(+) Blood dyscrasia, anemia ,   ?Anesthesia Other Findings ?Past Medical History: ?No date: Depression ?No date: Diastolic dysfunction ?    Comment:  a. 09/2019 Echo: EF 55-60%, no rwma, mod LVH, gr2 DD. Nl  ?             RV size/fxn. Mildly dil LA. Ao root 4.5cm. ?No date: Dilated aortic root ([place]) ?    Comment:  a. 09/2019 Echo: Ao root 4.5cm. ?No date: Elevated troponin level not due myocardial infarction ?No date: ESRD (end stage renal disease) ([place]) ?No date: Hypertension ?No date: Nonobstructive Coronary Artery Disease ?    Comment:  a. 09/2019 Cath: LM nl, LAD min irregs, D1 nl, D2 min  ?             irregs, LCX large, min irregs, RCA large, 20p, 30p/m. ?No date: PE (pulmonary embolism) ?No date: Renal insufficiency ? ?Past Surgical History: ?09/30/2019: LEFT HEART CATH AND CORONARY ANGIOGRAPHY; N/A ?    Comment:  Procedure: LEFT HEART CATH AND CORONARY ANGIOGRAPHY;    ?             Surgeon: Wellington Hampshire, MD;  Location: [facility]  ?             CV LAB;  Service: Cardiovascular;  Laterality: N/A; ?No date: TOTAL HIP ARTHROPLASTY ? ?BMI   ? Body Mass Index: 36.33 kg/m?  ?  ? ? Reproductive/Obstetrics ?negative OB ROS ? ?  ? ? ? ? ? ? ? ? ? ? ? ? ? ?  ?  ? ? ? ? ? ? ? ? ?Anesthesia Physical ?Anesthesia Plan ? ?ASA: 4 ? ?Anesthesia Plan: General  ? ?Post-op Pain Management:   ? ?Induction: Intravenous ? ?PONV Risk Score and Plan: Ondansetron, Dexamethasone, Midazolam and Treatment may vary due to age or medical condition ? ?Airway Management Planned: Oral ETT ? ?Additional Equipment:  ? ?Intra-op Plan:  ? ?Post-operative Plan: Extubation in OR ? ?Informed Consent: I have reviewed the patients History and Physical, chart, labs and discussed the procedure including the risks, benefits and alternatives for the proposed anesthesia with the patient or authorized representative who has indicated his/her understanding and acceptance.  ? ? ? ?Dental Advisory Given ? ?Plan Discussed with: CRNA and Surgeon ? ?Anesthesia Plan Comments:   ? ? ? ? ? ? ?Anesthesia Quick Evaluation ? ?

## 2021-10-07 NOTE — Progress Notes (Addendum)
Pt has had slightly labored breathing with coarse expiratory wheezing and faint rales in left base.  Sats in mid 80s room air.  Dr piscitello was notified and duoneb given. Breathing appears improved after although sats 89 room air.  Placed on 2 L [place] as pt did not have low sats prior to surgery. Pt remains drowsy from medications but does wake on own and to voice.  Oriented X 4.   ? ?Dr piscitello did see pt at bedside. Discussed holding further pain medication at this time, dr piscitello agreeable until more consistently awake. ? ?Wound vac was BLE with Y connector to one machine.  Kept saying leak and this RN along with 2 others unable to find a leak.  Per dr dew separated to two machines but right leg still saying leak. Dr dew notified. ?

## 2021-10-07 NOTE — Progress Notes (Addendum)
? ? ? ?Progress Note  ? ? ?[name]  GHW:[id number] DOB: [birth year]  DOA: 10/01/2021 ?PCP: [facility] [place]  ? ? ? ? ?Brief Narrative:  ? ? ?Medical records reviewed and are as summarized below: ? ? ?[name] is a 66 year old male with end-stage renal disease on hemodialysis, hypertension, hyperlipidemia, persistent atrial fibrillation on anticoagulation with Eliquis, GERD, BPH, chronic pain syndrome, history of noncompliance with hemodialysis, who presents emergency department for chief concerns of right lower extremity leg pain. ? ?Vitals in the emergency department showed temperature of 98.3, respiration rate of 20, heart rate of 116 and improved to 55, blood pressure 156/92, SPO2 of 93% on room air. ? ?Serum sodium 136, potassium 4.5, chloride 96, bicarb 27, BUN 45, serum creatinine of 6.79, nonfasting blood glucose 89, GFR of 8, WBC 8, hemoglobin 8, platelets of 161. ? ?Lactic acid is 1.1. ? ?ED treatment: Cefepime 2 g IV one-time dose, metronidazole 500 mg IV one-time dose, vancomycin per pharmacy.  Oxycodone 5-325 p.o. one-time dose, morphine 4 mg IV every 3 hours as needed for moderate pain ordered by EDP. ? ? ? ? ? ?Assessment/Plan:  ? ?Principal Problem: ?  Multiple open wounds of lower leg ?Active Problems: ?  HLD (hyperlipidemia) ?  Hypertension ?  ESRD on dialysis [facility]) ?  Anemia of chronic disease ?  CAD (coronary artery disease) ?  Chronic anticoagulation ?  AF (paroxysmal atrial fibrillation) ([place]) ?  Multiple open wounds of left lower leg ? ? ? ? ?Body mass index is 36.33 kg/m?.  (Obesity) ? ?Multiple open wounds of the lower legs with cellulitis: S/p I&D of bilateral lower extremity wounds on 10/07/2021.  Continue empiric IV antibiotics and analgesics.  Discontinue IV morphine and increase IV Dilaudid from 1 mg every 4 hours as needed to 2 mg every 3 hours as needed. ? ?Paroxysmal atrial fibrillation, CAD: IV heparin was discontinued surgery today.  Hopefully, he can resume  Eliquis tomorrow if okay with vascular surgeon. ? ?ESRD on HD on MWF schedule, anemia of chronic kidney disease, secondary hyperparathyroidism: Continue Epogen.  Follow-up with nephrologist for hemodialysis ? ?Other comorbidities include hypertension, hyperlipidemia ? ? ? ? ?Diet Order   ? ?       ?  Diet NPO time specified  Diet effective midnight       ?  ? ?  ?  ? ?  ? ? ? ? ? ? ?Consultants: ?Vascular surgeon ? ?Procedures: ?None ? ? ? ?Medications:  ? ? [MAR Hold] atorvastatin  40 mg Oral QHS  ? [MAR Hold] bumetanide  1 mg Oral 2 times per day on Sun Tue Thu Sat  ? [MAR Hold] carvedilol  6.25 mg Oral BID  ? [MAR Hold] epoetin (EPOGEN/PROCRIT) injection  4,000 Units Intravenous Q M,W,F-HD  ? fentaNYL      ? ipratropium-albuterol      ? [MAR Hold] oxyCODONE  20 mg Oral Q12H  ? [MAR Hold] pantoprazole  80 mg Oral Daily  ? [MAR Hold] pregabalin  25 mg Oral QHS  ? [MAR Hold] tamsulosin  0.4 mg Oral Daily  ? ?Continuous Infusions: ? [MAR Hold] sodium chloride 0 mL/hr at 10/04/21 0935  ? [MAR Hold] ceFEPime (MAXIPIME) IV 1 g (10/06/21 2114)  ? [START ON 10/08/2021] heparin    ? lactated ringers    ? [MAR Hold] metronidazole 500 mg (10/07/21 0909)  ? [MAR Hold] vancomycin Stopped (10/06/21 1459)  ? ? ? ?Anti-infectives (From  admission, onward)  ? ? Start     Dose/Rate Route Frequency Ordered Stop  ? 10/02/21 2200  [MAR Hold]  ceFEPIme (MAXIPIME) 1 g in sodium chloride 0.9 % 100 mL IVPB        (MAR Hold since Thu 10/07/2021 at 1400.Hold Reason: Transfer to a Procedural area)  ? 1 g ?200 mL/hr over 30 Minutes Intravenous Every 24 hours 10/01/21 2351    ? 10/02/21 0800  [MAR Hold]  metroNIDAZOLE (FLAGYL) IVPB 500 mg        (MAR Hold since Thu 10/07/2021 at 1400.Hold Reason: Transfer to a Procedural area)  ? 500 mg ?100 mL/hr over 60 Minutes Intravenous Every 12 hours 10/01/21 2341    ? 10/02/21 0000  [MAR Hold]  vancomycin (VANCOCIN) IVPB 1000 mg/200 mL premix        (MAR Hold since Thu 10/07/2021 at 1400.Hold Reason:  Transfer to a Procedural area)  ? 1,000 mg ?200 mL/hr over 60 Minutes Intravenous Every M-W-F (Hemodialysis) 10/01/21 2344 10/11/21 1159  ? 10/01/21 2230  vancomycin (VANCOCIN) IVPB 1000 mg/200 mL premix       ? 1,000 mg ?200 mL/hr over 60 Minutes Intravenous  Once 10/01/21 2121 10/02/21 0039  ? 10/01/21 2115  ceFEPIme (MAXIPIME) 2 g in sodium chloride 0.9 % 100 mL IVPB       ? 2 g ?200 mL/hr over 30 Minutes Intravenous  Once 10/01/21 2105 10/01/21 2214  ? 10/01/21 2115  metroNIDAZOLE (FLAGYL) IVPB 500 mg       ? 500 mg ?100 mL/hr over 60 Minutes Intravenous  Once 10/01/21 2105 10/01/21 2318  ? 10/01/21 2115  vancomycin (VANCOCIN) IVPB 1000 mg/200 mL premix       ? 1,000 mg ?200 mL/hr over 60 Minutes Intravenous  Once 10/01/21 2105 10/01/21 2323  ? ?  ? ? ? ? ? ? ? ? ? ?Family Communication/Anticipated D/C date and plan/Code Status  ? ?DVT prophylaxis:  ? ? ?  Code Status: Full Code ? ?Family Communication: None ?Disposition Plan: Plan to discharge home in 3 to 4 days ? ? ?Status is: Inpatient ?Remains inpatient appropriate because: IV antibiotics, IV heparin drip ? ? ? ? ? ? ?Subjective:  ? ?He complains of pain in bilateral legs. ? ?Objective:  ? ? ?Vitals:  ? 10/07/21 1614 10/07/21 1615 10/07/21 1630 10/07/21 1631  ?BP:  120/65 137/78   ?Pulse:  76    ?Resp:  19 17   ?Temp:      ?TempSrc:      ?SpO2: (!) 87% (!) 89% 97% 91%  ?Weight:      ?Height:      ? ?No data found. ? ? ?Intake/Output Summary (Last 24 hours) at 10/07/2021 1636 ?Last data filed at 10/07/2021 0400 ?Gross per 24 hour  ?Intake 650.51 ml  ?Output --  ?Net 650.51 ml  ? ?Filed Weights  ? 10/06/21 1030 10/06/21 1437 10/07/21 1405  ?Weight: 121.9 kg 121.5 kg 121.5 kg  ? ? ?Exam: ? ?GEN: NAD ?SKIN: Bilateral legs with multiple wounds ?EYES: EOMI ?ENT: MMM ?CV: RRR ?PULM: CTA B ?ABD: soft, obese, NT, +BS ?CNS: AAO x 3, non focal ?EXT: Bilateral lower extremity edema (thighs to feet), bilateral leg tenderness ? ? ? ? ? ?  ? ? ?Data Reviewed:  ? ?I have  personally reviewed following labs and imaging studies: ? ?Labs: ?Labs show the following:  ? ?Basic Metabolic Panel: ?Recent Labs  ?Lab 10/01/21 ?2125 10/02/21 ?0212 10/04/21 ?0522 10/06/21 ?5400  10/07/21 ?3817  ?NA 136 134* 136 134* 133*  ?K 4.5 4.5 5.5* 4.9 4.1  ?CL 96* 95* 98 97* 94*  ?CO2 27 26 22 25 26   ?GLUCOSE 89 118* 90 87 98  ?BUN 45* 47* 71* 55* 34*  ?CREATININE 6.79* 7.21* 9.58* 7.89* 5.40*  ?CALCIUM 8.4* 8.5* 8.2* 8.7* 8.5*  ?PHOS  --   --   --  7.5*  --   ? ?GFR ?Estimated Creatinine Clearance: 18.9 mL/min (A) (by C-G formula based on SCr of 5.4 mg/dL (H)). ?Liver Function Tests: ?Recent Labs  ?Lab 10/01/21 ?2125 10/04/21 ?0522 10/06/21 ?0435  ?AST 31 15  --   ?ALT 38 22  --   ?ALKPHOS 173* 135*  --   ?BILITOT 0.9 1.1  --   ?PROT 8.2* 7.4  --   ?ALBUMIN 2.8* 2.8* 2.7*  ? ?No results for input(s): LIPASE, AMYLASE in the last 168 hours. ?No results for input(s): AMMONIA in the last 168 hours. ?Coagulation profile ?No results for input(s): INR, PROTIME in the last 168 hours. ? ?CBC: ?Recent Labs  ?Lab 10/03/21 ?0348 10/04/21 ?0522 10/05/21 ?7116 10/06/21 ?0435 10/07/21 ?5790  ?WBC 7.4 7.1 7.2 6.3 9.6  ?HGB 8.1* 7.7* 7.9* 7.8* 8.1*  ?HCT 25.2* 23.8* 24.6* 23.5* 24.8*  ?MCV 86.3 85.6 86.6 85.1 86.1  ?PLT 151 142* 130* 137* 128*  ? ?Cardiac Enzymes: ?No results for input(s): CKTOTAL, CKMB, CKMBINDEX, TROPONINI in the last 168 hours. ?BNP (last 3 results) ?No results for input(s): PROBNP in the last 8760 hours. ?CBG: ?No results for input(s): GLUCAP in the last 168 hours. ?D-Dimer: ?No results for input(s): DDIMER in the last 72 hours. ?Hgb A1c: ?No results for input(s): HGBA1C in the last 72 hours. ?Lipid Profile: ?No results for input(s): CHOL, HDL, LDLCALC, TRIG, CHOLHDL, LDLDIRECT in the last 72 hours. ?Thyroid function studies: ?No results for input(s): TSH, T4TOTAL, T3FREE, THYROIDAB in the last 72 hours. ? ?Invalid input(s): FREET3 ?Anemia work up: ?No results for input(s): VITAMINB12, FOLATE,  FERRITIN, TIBC, IRON, RETICCTPCT in the last 72 hours. ?Sepsis Labs: ?Recent Labs  ?Lab 10/01/21 ?2125 10/02/21 ?0212 10/03/21 ?0348 10/04/21 ?0522 10/05/21 ?3833 10/06/21 ?0435 10/07/21 ?3832  ?WBC 8.0 7.2   < > 7.1

## 2021-10-07 NOTE — H&P (View-Only) (Signed)
[name] ? ?Daily Progress Note ? ? ?Subjective  -  ? ?Late entry.  Today the patient is sitting comfortably and in good spirits.  He does note painfulness with wound dressing changes but otherwise is well. ? ?Objective ?Vitals:  ? 10/06/21 1415 10/06/21 1430 10/06/21 1437 10/06/21 2025  ?BP: 131/75 137/78  (!) 151/88  ?Pulse: 79 66  97  ?Resp: 20 17  18   ?Temp:  98 ?F (36.7 ?C)  (!) 97.4 ?F (36.3 ?C)  ?TempSrc:  Oral    ?SpO2:    98%  ?Weight:   121.5 kg   ?Height:      ? ? ?Intake/Output Summary (Last 24 hours) at 10/07/2021 0103 ?Last data filed at 10/06/2021 2300 ?Gross per 24 hour  ?Intake 1077.98 ml  ?Output 1099 ml  ?Net -21.02 ml  ? ? ?PULM  CTAB ?CV  RRR ?VASC  bilateral pitting edema bilaterally with weeping ? ?Laboratory ?CBC ?   ?Component Value Date/Time  ? WBC 6.3 10/06/2021 0435  ? HGB 7.8 (L) 10/06/2021 0435  ? HGB 14.3 11/08/2014 1559  ? HCT 23.5 (L) 10/06/2021 0435  ? HCT 42.2 11/08/2014 1559  ? PLT 137 (L) 10/06/2021 0435  ? PLT 231 11/08/2014 1559  ? ? ?BMET ?   ?Component Value Date/Time  ? NA 134 (L) 10/06/2021 0435  ? NA 138 11/08/2014 1559  ? K 4.9 10/06/2021 0435  ? K 3.5 11/08/2014 1559  ? CL 97 (L) 10/06/2021 0435  ? CL 103 11/08/2014 1559  ? CO2 25 10/06/2021 0435  ? CO2 27 11/08/2014 1559  ? GLUCOSE 87 10/06/2021 0435  ? GLUCOSE 143 (H) 11/08/2014 1559  ? BUN 55 (H) 10/06/2021 0435  ? BUN 16 11/08/2014 1559  ? CREATININE 7.89 (H) 10/06/2021 0435  ? CREATININE 1.60 (H) 11/08/2014 1559  ? CALCIUM 8.7 (L) 10/06/2021 0435  ? CALCIUM 9.2 11/08/2014 1559  ? GFRNONAA 7 (L) 10/06/2021 0435  ? GFRNONAA 47 (L) 11/08/2014 1559  ? GFRAA 10 (L) 01/16/2020 1009  ? GFRAA 55 (L) 11/08/2014 1559  ? ? ?Assessment/Planning: ?Venous ulcerations ?The patient has extensive venous ulcerations bilaterally.  Based on the extensiveness of these ulcerations he would benefit from debridement.  We will also have bilateral lower extremity arterial duplexes performed as currently ABIs are not  feasible based on wound location.  This is to ensure that postdebridement the patient will have adequate ability for wound healing.  I discussed the risk, benefits and alternatives and the patient is agreeable to proceed. ? ?Kris Hartmann ? ?10/07/2021, 1:03 AM ? ? ? ?  ?

## 2021-10-07 NOTE — Anesthesia Postprocedure Evaluation (Signed)
Anesthesia Post Note ? ?Patient: [name] ? ?Procedure(s) Performed: IRRIGATION AND DEBRIDEMENT ABSCESS BILATERAL LOWER EXTREMITIES (Bilateral: Leg Lower) ? ?Patient location during evaluation: PACU ?Anesthesia Type: General ?Level of consciousness: awake and alert ?Pain management: pain level controlled ?Vital Signs Assessment: post-procedure vital signs reviewed and stable ?Respiratory status: spontaneous breathing, respiratory function stable and patient connected to nasal cannula oxygen ?Cardiovascular status: blood pressure returned to baseline and stable ?Postop Assessment: no apparent nausea or vomiting ?Anesthetic complications: no ? ? ?No notable events documented. ? ? ?Last Vitals:  ?Vitals:  ? 10/07/21 1630 10/07/21 1631  ?BP: 137/78   ?Pulse:    ?Resp: 17   ?Temp: 37 ?C   ?SpO2: 97% 91%  ?  ?Last Pain:  ?Vitals:  ? 10/07/21 1615  ?TempSrc:   ?PainSc: 6   ? ? ?  ?  ?  ?  ?  ?  ? ?[name] ? ? ? ? ?

## 2021-10-07 NOTE — Consult Note (Signed)
ANTICOAGULATION CONSULT NOTE ? ?Pharmacy Consult for Heparin infusion ?Indication: atrial fibrillation; hx of PE on eliquis ? ?Patient Measurements: ?Height: 6' (182.9 cm) ?Weight: 121.5 kg (267 lb 13.7 oz) (bedscale) ?IBW/kg (Calculated) : 77.6 ?Heparin Dosing Weight: 104.6 kg  ? ?Vital Signs: ?Temp: 97.9 ?F (36.6 ?C) (03/30 0355) ?BP: 135/70 (03/30 0355) ?Pulse Rate: 89 (03/30 0355) ? ?Labs: ?Recent Labs  ?  10/05/21 ?1884 10/05/21 ?1502 10/06/21 ?0435 10/07/21 ?1660  ?HGB 7.9*  --  7.8* 8.1*  ?HCT 24.6*  --  23.5* 24.8*  ?PLT 130*  --  137* 128*  ?APTT 103*  --   --   --   ?HEPARINUNFRC 0.67 0.53 0.53 0.33  ?CREATININE  --   --  7.89* 5.40*  ? ? ? ?Estimated Creatinine Clearance: 18.9 mL/min (A) (by C-G formula based on SCr of 5.4 mg/dL (H)). ? ? ?Medical History: ?Past Medical History:  ?Diagnosis Date  ? Depression   ? Diastolic dysfunction   ? a. 09/2019 Echo: EF 55-60%, no rwma, mod LVH, gr2 DD. Nl RV size/fxn. Mildly dil LA. Ao root 4.5cm.  ? Dilated aortic root ([place])   ? a. 09/2019 Echo: Ao root 4.5cm.  ? Elevated troponin level not due myocardial infarction   ? ESRD (end stage renal disease) ([place])   ? Hypertension   ? Nonobstructive Coronary Artery Disease   ? a. 09/2019 Cath: LM nl, LAD min irregs, D1 nl, D2 min irregs, LCX large, min irregs, RCA large, 20p, 30p/m.  ? PE (pulmonary embolism)   ? Renal insufficiency   ? ? ?Medications:  ?Eliquis 5 mg BID: last dose 3/25 0900 ? ?Assessment: ?66 y.o. male with severe bilateral lower extremity venous stasis ulceration. Patient on eliquis PTA for Afib, hx of DVT. held eliquis for possible surgical intervention of wounds. transitioned to heparin infusion ? ?Goal of Therapy:  ?Heparin level 0.3-0.7 units/ml ?Monitor platelets by anticoagulation protocol: Yes ? ?3/26 0348 aPTT 76, therapeutic x 1, HL > 1.1 ?3/26 1216 aPTT 60, subtherapeutic ?3/26 2215 aPTT 82, therapeutic x 1 ?3/27 0522 aPTT 93, therapeutic x 2, HL 0.86 ?3/28 0551 aPTT 103, slightly  supratherapeutic, HL 0.67 therapeutic ?3/28 1502 HL 0.53 ?3/29 0435 HL 0.53, therapeutic X 3  ?3/30 0551 HL 0.33, therapeutic X 4 ?  ?Plan:  ?3/30: HL @ 0551 = 0.33, therapeutic X 4 ?Will continue pt on current rate and recheck HL on 3/31 with AM labs.  ? ?Charlann Wayne D, PharmD ?Clinical Pharmacist  ?10/07/2021 6:51 AM  ? ? ? ? ? ? ?

## 2021-10-08 ENCOUNTER — Encounter: Payer: Self-pay | Admitting: Vascular Surgery

## 2021-10-08 DIAGNOSIS — S81809A Unspecified open wound, unspecified lower leg, initial encounter: Secondary | ICD-10-CM | POA: Diagnosis not present

## 2021-10-08 LAB — CBC
HCT: 23.8 % — ABNORMAL LOW (ref 39.0–52.0)
Hemoglobin: 7.8 g/dL — ABNORMAL LOW (ref 13.0–17.0)
MCH: 28.6 pg (ref 26.0–34.0)
MCHC: 32.8 g/dL (ref 30.0–36.0)
MCV: 87.2 fL (ref 80.0–100.0)
Platelets: 124 10*3/uL — ABNORMAL LOW (ref 150–400)
RBC: 2.73 MIL/uL — ABNORMAL LOW (ref 4.22–5.81)
RDW: 16.6 % — ABNORMAL HIGH (ref 11.5–15.5)
WBC: 13.4 10*3/uL — ABNORMAL HIGH (ref 4.0–10.5)
nRBC: 0 % (ref 0.0–0.2)

## 2021-10-08 LAB — VANCOMYCIN, RANDOM: Vancomycin Rm: 10

## 2021-10-08 MED ORDER — APIXABAN 5 MG PO TABS
5.0000 mg | ORAL_TABLET | Freq: Two times a day (BID) | ORAL | Status: DC
  Administered 2021-10-08 – 2021-10-14 (×12): 5 mg via ORAL
  Filled 2021-10-08 (×12): qty 1

## 2021-10-08 MED ORDER — SULFAMETHOXAZOLE-TRIMETHOPRIM 400-80 MG PO TABS
1.0000 | ORAL_TABLET | Freq: Two times a day (BID) | ORAL | Status: DC
  Administered 2021-10-10 – 2021-10-14 (×8): 1 via ORAL
  Filled 2021-10-08 (×8): qty 1

## 2021-10-08 MED ORDER — EPOETIN ALFA 4000 UNIT/ML IJ SOLN
INTRAMUSCULAR | Status: AC
  Administered 2021-10-08: 4000 [IU]
  Filled 2021-10-08: qty 1

## 2021-10-08 MED ORDER — SULFAMETHOXAZOLE-TRIMETHOPRIM 800-160 MG PO TABS
1.0000 | ORAL_TABLET | Freq: Once | ORAL | Status: AC
  Administered 2021-10-10: 1 via ORAL
  Filled 2021-10-08: qty 1

## 2021-10-08 MED ORDER — BISACODYL 10 MG RE SUPP
10.0000 mg | Freq: Once | RECTAL | Status: AC
  Administered 2021-10-08: 10 mg via RECTAL
  Filled 2021-10-08: qty 1

## 2021-10-08 NOTE — TOC Progression Note (Signed)
Transition of Care (TOC) - Progression Note  ? ? ?Patient Details  ?Name: [name] ?MRN: [id number] ?Date of Birth: [birth year] ? ?Transition of Care (TOC) CM/SW Contact  ?Beverly Sessions, RN ?Phone Number: ?10/08/2021, 3:03 PM ? ?Clinical Narrative:    ?Patient off the floor for HD.  ?Reached out to MD and vascular to determine anticipated DC date, and need for wound vacs at discharge  ? ? ?Expected Discharge Plan: Home/Self Care ?Barriers to Discharge: Continued Medical Work up ? ?Expected Discharge Plan and Services ?Expected Discharge Plan: Home/Self Care ?  ?  ?  ?Living arrangements for the past 2 months: [place] ?                ?  ?  ?  ?  ?  ?  ?  ?  ?  ?  ? ? ?Social Determinants of Health (SDOH) Interventions ?  ? ?Readmission Risk Interventions ? ?  10/03/2021  ?  1:48 PM  ?Readmission Risk Prevention Plan  ?Transportation Screening Complete  ?Medication Review Press photographer) Complete  ?PCP or Specialist appointment within 3-5 days of discharge Complete  ?[place] or Home Care Consult Complete  ?Palliative Care Screening Not Applicable  ?[place] Not Applicable  ? ? ?

## 2021-10-08 NOTE — Progress Notes (Signed)
[facility] ?[place], [place] ?10/08/21 ? ?Subjective:  ? ?Hospital day # 6 ? ?Patient known to our practice from previous admissions  ?This time patient presents for right lower extremity pain from the wound. ?Treated with broad-spectrum antibiotics including cefepime, metronidazole and vancomycin in the emergency room. ?Admitted for wound evaluation by surgical services and IV antibiotics. ? ?Patient seen and evaluated during dialysis ?  ?HEMODIALYSIS FLOWSHEET: ? ?Blood Flow Rate (mL/min): 400 mL/min ?Arterial Pressure (mmHg): -270 mmHg ?Venous Pressure (mmHg): 140 mmHg ?Transmembrane Pressure (mmHg): 50 mmHg ?Ultrafiltration Rate (mL/min): 590 mL/min ?Dialysate Flow Rate (mL/min): 600 ml/min ?Conductivity: Machine : 13.8 ?Conductivity: Machine : 13.8 ?Dialysis Fluid Bolus: Normal Saline ?Bolus Amount (mL): 250 mL ? ?Continues to complain of lower extremity pain ?Tolerating dialysis well ? ? ?Objective:  ?Vital signs in last 24 hours:  ?Temp:  [97.8 ?F (36.6 ?C)-99.2 ?F (37.3 ?C)] 97.8 ?F (36.6 ?C) (03/31 0920) ?Pulse Rate:  [69-84] 69 (03/31 0408) ?Resp:  [11-29] 13 (03/31 1230) ?BP: (101-137)/(57-90) 126/78 (03/31 1230) ?SpO2:  [87 %-100 %] 100 % (03/31 0408) ?Weight:  [121.5 kg-124.9 kg] 124.9 kg (03/31 0920) ? ?Weight change: -0.4 kg ?Filed Weights  ? 10/06/21 1437 10/07/21 1405 10/08/21 0920  ?Weight: 121.5 kg 121.5 kg 124.9 kg  ? ? ?Intake/Output: ?  ? ?Intake/Output Summary (Last 24 hours) at 10/08/2021 1246 ?Last data filed at 10/08/2021 1000 ?Gross per 24 hour  ?Intake 1198.31 ml  ?Output 50 ml  ?Net 1148.31 ml  ? ? ? ? ?Physical Exam: ?General: NAD  ?HEENT Anicteric, moist oral mucous membranes  ?Pulm/lungs Normal breathing effort on room air  ?CVS/Heart No rub or gallop, irregular  ?Abdomen:  Soft  ?Extremities: 2+  pitting edema bilateral lower extremities, bilateral gauze leg wraps  ?Neurologic: Alert oriented  ?Skin: skin changes from chronic edema bilaterally  ?Access: Left upper  arm AV fistula, good thrill  ?   ? ? ?Basic Metabolic Panel: ? ?Recent Labs  ?Lab 10/01/21 ?2125 10/02/21 ?0212 10/04/21 ?0522 10/06/21 ?0435 10/07/21 ?7169  ?NA 136 134* 136 134* 133*  ?K 4.5 4.5 5.5* 4.9 4.1  ?CL 96* 95* 98 97* 94*  ?CO2 27 26 22 25 26   ?GLUCOSE 89 118* 90 87 98  ?BUN 45* 47* 71* 55* 34*  ?CREATININE 6.79* 7.21* 9.58* 7.89* 5.40*  ?CALCIUM 8.4* 8.5* 8.2* 8.7* 8.5*  ?PHOS  --   --   --  7.5*  --   ? ? ? ? ?CBC: ?Recent Labs  ?Lab 10/04/21 ?0522 10/05/21 ?6789 10/06/21 ?0435 10/07/21 ?3810 10/08/21 ?1751  ?WBC 7.1 7.2 6.3 9.6 13.4*  ?HGB 7.7* 7.9* 7.8* 8.1* 7.8*  ?HCT 23.8* 24.6* 23.5* 24.8* 23.8*  ?MCV 85.6 86.6 85.1 86.1 87.2  ?PLT 142* 130* 137* 128* 124*  ? ? ? ?  ?Lab Results  ?Component Value Date  ? HEPBSAG NON REACTIVE 10/04/2021  ? HEPBSAB Reactive (A) 10/04/2021  ? HEPBIGM Negative 11/15/2018  ? ? ? ? ?Microbiology: ? ?Recent Results (from the past 240 hour(s))  ?Resp Panel by RT-PCR (Flu A&B, Covid) Nasopharyngeal Swab     Status: None  ? Collection Time: 10/01/21  9:41 PM  ? Specimen: Nasopharyngeal Swab; Nasopharyngeal(NP) swabs in vial transport medium  ?Result Value Ref Range Status  ? SARS Coronavirus 2 by RT PCR NEGATIVE NEGATIVE Final  ?  Comment: (NOTE) ?SARS-CoV-2 target nucleic acids are NOT DETECTED. ? ?The SARS-CoV-2 RNA is generally detectable in upper respiratory ?specimens during the acute phase of infection. The  lowest ?concentration of SARS-CoV-2 viral copies this assay can detect is ?138 copies/mL. A negative result does not preclude SARS-Cov-2 ?infection and should not be used as the sole basis for treatment or ?other patient management decisions. A negative result may occur with  ?improper specimen collection/handling, submission of specimen other ?than nasopharyngeal swab, presence of viral mutation(s) within the ?areas targeted by this assay, and inadequate number of viral ?copies(<138 copies/mL). A negative result must be combined with ?clinical observations,  patient history, and epidemiological ?information. The expected result is Negative. ? ?Fact Sheet for Patients:  ?EntrepreneurPulse.com.au ? ?Fact Sheet for Healthcare Providers:  ?IncredibleEmployment.be ? ?This test is no t yet approved or cleared by the [place] FDA and  ?has been authorized for detection and/or diagnosis of SARS-CoV-2 by ?FDA under an Emergency Use Authorization (EUA). This EUA will remain  ?in effect (meaning this test can be used) for the duration of the ?COVID-19 declaration under Section 564(b)(1) of the Act, 21 ?U.S.C.section 360bbb-3(b)(1), unless the authorization is terminated  ?or revoked sooner.  ? ? ?  ? Influenza A by PCR NEGATIVE NEGATIVE Final  ? Influenza B by PCR NEGATIVE NEGATIVE Final  ?  Comment: (NOTE) ?The Xpert Xpress SARS-CoV-2/FLU/RSV plus assay is intended as an aid ?in the diagnosis of influenza from Nasopharyngeal swab specimens and ?should not be used as a sole basis for treatment. Nasal washings and ?aspirates are unacceptable for Xpert Xpress SARS-CoV-2/FLU/RSV ?testing. ? ?Fact Sheet for Patients: ?EntrepreneurPulse.com.au ? ?Fact Sheet for Healthcare Providers: ?IncredibleEmployment.be ? ?This test is not yet approved or cleared by the [place] FDA and ?has been authorized for detection and/or diagnosis of SARS-CoV-2 by ?FDA under an Emergency Use Authorization (EUA). This EUA will remain ?in effect (meaning this test can be used) for the duration of the ?COVID-19 declaration under Section 564(b)(1) of the Act, 21 U.S.C. ?section 360bbb-3(b)(1), unless the authorization is terminated or ?revoked. ? ?Performed at [facility], [place], ?[place] [address] ?  ?MRSA Next Gen by PCR, Nasal     Status: Abnormal  ? Collection Time: 10/02/21  2:55 AM  ? Specimen: Nasal Mucosa; Nasal Swab  ?Result Value Ref Range Status  ? MRSA by PCR Next Gen DETECTED (A) NOT DETECTED  Final  ?  Comment: RESULT CALLED TO, READ BACK BY AND VERIFIED WITH: ?MELISSA COBB AT 0422 10/02/21.PMF ?(NOTE) ?The GeneXpert MRSA Assay (FDA approved for NASAL specimens only), ?is one component of a comprehensive MRSA colonization surveillance ?program. It is not intended to diagnose MRSA infection nor to guide ?or monitor treatment for MRSA infections. ?Test performance is not FDA approved in patients less than 2 years ?old. ?Performed at [facility], [place], ?[place] [address] ?  ? ? ?Coagulation Studies: ?No results for input(s): LABPROT, INR in the last 72 hours. ? ?Urinalysis: ?No results for input(s): COLORURINE, LABSPEC, [place], GLUCOSEU, HGBUR, BILIRUBINUR, KETONESUR, PROTEINUR, UROBILINOGEN, NITRITE, LEUKOCYTESUR in the last 72 hours. ? ?Invalid input(s): APPERANCEUR  ? ? ?Imaging: ?US ARTERIAL LOWER EXTREMITY DUPLEX BILATERAL ? ?Result Date: 10/07/2021 ?CLINICAL DATA:  Bilateral lower extremity venous ulcerations. Evaluate arterial circulation prior to debridement. EXAM: BILATERAL LOWER EXTREMITY ARTERIAL DUPLEX SCAN TECHNIQUE: Gray-scale sonography as well as color Doppler and duplex ultrasound was performed to evaluate the arteries of both lower extremities including the common, superficial and profunda femoral arteries, popliteal artery and calf arteries. COMPARISON:  None. FINDINGS: Right Lower Extremity Inflow: Normal common femoral arterial waveforms and velocities. No evidence  of inflow (aortoiliac) disease. Outflow: Normal profunda femoral, superficial femoral and popliteal arterial waveforms and velocities. No focal elevation of the PSV to suggest stenosis. Runoff: Abnormal monophasic arterial flow in the posterior tibial artery. Monophasic flow with slow velocities in the distal anterior tibial artery. Left Lower Extremity Inflow: Normal common femoral arterial waveforms and velocities. No evidence of inflow (aortoiliac) disease. Outflow: Normal profunda femoral,  superficial femoral and popliteal arterial waveforms and velocities. No focal elevation of the PSV to suggest stenosis. Runoff: Abnormal monophasic arterial flow in the posterior tibial artery. Monophasic flow

## 2021-10-08 NOTE — Progress Notes (Signed)
? ? ? ?Progress Note  ? ? ?[name]  FWY:[id number] DOB: [birth year]  DOA: 10/01/2021 ?PCP: [facility]  ? ? ? ? ?Brief Narrative:  ? ? ?Medical records reviewed and are as summarized below: ? ? ?[name] is a 66 year old male with end-stage renal disease on hemodialysis, hypertension, hyperlipidemia, persistent atrial fibrillation on anticoagulation with Eliquis, GERD, BPH, chronic pain syndrome, history of noncompliance with hemodialysis, who presents emergency department for chief concerns of right lower extremity leg pain. ? ?Vitals in the emergency department showed temperature of 98.3, respiration rate of 20, heart rate of 116 and improved to 55, blood pressure 156/92, SPO2 of 93% on room air. ? ?Serum sodium 136, potassium 4.5, chloride 96, bicarb 27, BUN 45, serum creatinine of 6.79, nonfasting blood glucose 89, GFR of 8, WBC 8, hemoglobin 8, platelets of 161. ? ?Lactic acid is 1.1. ? ?ED treatment: Cefepime 2 g IV one-time dose, metronidazole 500 mg IV one-time dose, vancomycin per pharmacy.  Oxycodone 5-325 p.o. one-time dose, morphine 4 mg IV every 3 hours as needed for moderate pain ordered by EDP. ? ? ? ? ? ?Assessment/Plan:  ? ?Principal Problem: ?  Multiple open wounds of lower leg ?Active Problems: ?  HLD (hyperlipidemia) ?  Hypertension ?  ESRD on dialysis [facility]) ?  Anemia of chronic disease ?  CAD (coronary artery disease) ?  Chronic anticoagulation ?  AF (paroxysmal atrial fibrillation) ([place]) ?  Multiple open wounds of left lower leg ? ? ? ? ?Body mass index is 36.99 kg/m?.  (Obesity) ? ?Multiple open wounds of the lower legs with cellulitis: S/p I&D of bilateral lower extremity wounds on 10/07/2021.  Continue local wound care with wound VAC therapy.  Continue empiric IV antibiotics and analgesics. ? ?Paroxysmal atrial fibrillation, CAD: Restart Eliquis today. ? ?ESRD on HD on MWF schedule, anemia of chronic kidney disease, secondary hyperparathyroidism: Continue  Epogen.  Follow-up with nephrologist for hemodialysis ? ?Other comorbidities include hypertension, hyperlipidemia ? ? ? ? ?Diet Order   ? ?       ?  Diet Heart Room service appropriate? Yes; Fluid consistency: Thin  Diet effective now       ?  ? ?  ?  ? ?  ? ? ? ? ? ? ?Consultants: ?Vascular surgeon ? ?Procedures: ?None ? ? ? ?Medications:  ? ? apixaban  5 mg Oral BID  ? atorvastatin  40 mg Oral QHS  ? bisacodyl  10 mg Rectal Once  ? bumetanide  1 mg Oral 2 times per day on Sun Tue Thu Sat  ? carvedilol  6.25 mg Oral BID  ? epoetin (EPOGEN/PROCRIT) injection  4,000 Units Intravenous Q M,W,F-HD  ? oxyCODONE  20 mg Oral Q12H  ? pantoprazole  80 mg Oral Daily  ? pregabalin  25 mg Oral QHS  ? tamsulosin  0.4 mg Oral Daily  ? ?Continuous Infusions: ? sodium chloride 0 mL/hr at 10/04/21 0935  ? ceFEPime (MAXIPIME) IV Stopped (10/07/21 2247)  ? metronidazole 500 mg (10/08/21 0810)  ? ? ? ?Anti-infectives (From admission, onward)  ? ? Start     Dose/Rate Route Frequency Ordered Stop  ? 10/02/21 2200  ceFEPIme (MAXIPIME) 1 g in sodium chloride 0.9 % 100 mL IVPB       ? 1 g ?200 mL/hr over 30 Minutes Intravenous Every 24 hours 10/01/21 2351    ? 10/02/21 0800  metroNIDAZOLE (FLAGYL) IVPB 500 mg       ?  500 mg ?100 mL/hr over 60 Minutes Intravenous Every 12 hours 10/01/21 2341    ? 10/02/21 0000  vancomycin (VANCOCIN) IVPB 1000 mg/200 mL premix       ? 1,000 mg ?200 mL/hr over 60 Minutes Intravenous Every M-W-F (Hemodialysis) 10/01/21 2344 10/08/21 1308  ? 10/01/21 2230  vancomycin (VANCOCIN) IVPB 1000 mg/200 mL premix       ? 1,000 mg ?200 mL/hr over 60 Minutes Intravenous  Once 10/01/21 2121 10/02/21 0039  ? 10/01/21 2115  ceFEPIme (MAXIPIME) 2 g in sodium chloride 0.9 % 100 mL IVPB       ? 2 g ?200 mL/hr over 30 Minutes Intravenous  Once 10/01/21 2105 10/01/21 2214  ? 10/01/21 2115  metroNIDAZOLE (FLAGYL) IVPB 500 mg       ? 500 mg ?100 mL/hr over 60 Minutes Intravenous  Once 10/01/21 2105 10/01/21 2318  ? 10/01/21 2115   vancomycin (VANCOCIN) IVPB 1000 mg/200 mL premix       ? 1,000 mg ?200 mL/hr over 60 Minutes Intravenous  Once 10/01/21 2105 10/01/21 2323  ? ?  ? ? ? ? ? ? ? ? ? ?Family Communication/Anticipated D/C date and plan/Code Status  ? ?DVT prophylaxis:  ?apixaban (ELIQUIS) tablet 5 mg  ? ?  Code Status: Full Code ? ?Family Communication: None ?Disposition Plan: Plan to discharge home in 2 to 3 days ? ? ?Status is: Inpatient ?Remains inpatient appropriate because: IV antibiotics ? ? ? ? ? ?Subjective:  ? ?Interval events noted.  He complains of pain in bilateral legs and constipation.  However, he said pain in the legs have improved since surgery.  No abdominal pain or vomiting ? ?Objective:  ? ? ?Vitals:  ? 10/08/21 1315 10/08/21 1319 10/08/21 1321 10/08/21 1323  ?BP: 122/71     ?Pulse:      ?Resp: 20 16 17 16   ?Temp:  97.7 ?F (36.5 ?C)    ?TempSrc:  Oral    ?SpO2:      ?Weight:   123.7 kg   ?Height:      ? ?No data found. ? ? ?Intake/Output Summary (Last 24 hours) at 10/08/2021 1513 ?Last data filed at 10/08/2021 1310 ?Gross per 24 hour  ?Intake 1198.31 ml  ?Output 1576 ml  ?Net -377.69 ml  ? ?Filed Weights  ? 10/07/21 1405 10/08/21 0920 10/08/21 1321  ?Weight: 121.5 kg 124.6 kg 123.7 kg  ? ? ?Exam: ? ?GEN: NAD ?SKIN: Surgical wounds on bilateral legs connected to wound VAC ?EYES: No pallor or icterus ?ENT: MMM ?CV: RRR ?PULM: CTA B ?ABD: soft, obese, NT, +BS ?CNS: AAO x 3, non focal ?EXT: Bilateral leg edema.  Mild tenderness of bilateral legs ? ? ? ? ? ? ? ? ?  ? ? ?Data Reviewed:  ? ?I have personally reviewed following labs and imaging studies: ? ?Labs: ?Labs show the following:  ? ?Basic Metabolic Panel: ?Recent Labs  ?Lab 10/01/21 ?2125 10/02/21 ?0212 10/04/21 ?0522 10/06/21 ?0435 10/07/21 ?2595  ?NA 136 134* 136 134* 133*  ?K 4.5 4.5 5.5* 4.9 4.1  ?CL 96* 95* 98 97* 94*  ?CO2 27 26 22 25 26   ?GLUCOSE 89 118* 90 87 98  ?BUN 45* 47* 71* 55* 34*  ?CREATININE 6.79* 7.21* 9.58* 7.89* 5.40*  ?CALCIUM 8.4* 8.5* 8.2* 8.7*  8.5*  ?PHOS  --   --   --  7.5*  --   ? ?GFR ?Estimated Creatinine Clearance: 19 mL/min (A) (by C-G formula based on SCr  of 5.4 mg/dL (H)). ?Liver Function Tests: ?Recent Labs  ?Lab 10/01/21 ?2125 10/04/21 ?0522 10/06/21 ?0435  ?AST 31 15  --   ?ALT 38 22  --   ?ALKPHOS 173* 135*  --   ?BILITOT 0.9 1.1  --   ?PROT 8.2* 7.4  --   ?ALBUMIN 2.8* 2.8* 2.7*  ? ?No results for input(s): LIPASE, AMYLASE in the last 168 hours. ?No results for input(s): AMMONIA in the last 168 hours. ?Coagulation profile ?No results for input(s): INR, PROTIME in the last 168 hours. ? ?CBC: ?Recent Labs  ?Lab 10/04/21 ?0522 10/05/21 ?9604 10/06/21 ?0435 10/07/21 ?5409 10/08/21 ?8119  ?WBC 7.1 7.2 6.3 9.6 13.4*  ?HGB 7.7* 7.9* 7.8* 8.1* 7.8*  ?HCT 23.8* 24.6* 23.5* 24.8* 23.8*  ?MCV 85.6 86.6 85.1 86.1 87.2  ?PLT 142* 130* 137* 128* 124*  ? ?Cardiac Enzymes: ?No results for input(s): CKTOTAL, CKMB, CKMBINDEX, TROPONINI in the last 168 hours. ?BNP (last 3 results) ?No results for input(s): PROBNP in the last 8760 hours. ?CBG: ?No results for input(s): GLUCAP in the last 168 hours. ?D-Dimer: ?No results for input(s): DDIMER in the last 72 hours. ?Hgb A1c: ?No results for input(s): HGBA1C in the last 72 hours. ?Lipid Profile: ?No results for input(s): CHOL, HDL, LDLCALC, TRIG, CHOLHDL, LDLDIRECT in the last 72 hours. ?Thyroid function studies: ?No results for input(s): TSH, T4TOTAL, T3FREE, THYROIDAB in the last 72 hours. ? ?Invalid input(s): FREET3 ?Anemia work up: ?No results for input(s): VITAMINB12, FOLATE, FERRITIN, TIBC, IRON, RETICCTPCT in the last 72 hours. ?Sepsis Labs: ?Recent Labs  ?Lab 10/01/21 ?2125 10/02/21 ?0212 10/03/21 ?0348 10/05/21 ?1478 10/06/21 ?0435 10/07/21 ?2956 10/08/21 ?2130  ?WBC 8.0 7.2   < > 7.2 6.3 9.6 13.4*  ?LATICACIDVEN 1.1 0.9  --   --   --   --   --   ? < > = values in this interval not displayed.  ? ? ?Microbiology ?Recent Results (from the past 240 hour(s))  ?Resp Panel by RT-PCR (Flu A&B, Covid)  Nasopharyngeal Swab     Status: None  ? Collection Time: 10/01/21  9:41 PM  ? Specimen: Nasopharyngeal Swab; Nasopharyngeal(NP) swabs in vial transport medium  ?Result Value Ref Range Status  ? SARS Coronavirus 2 by RT PC

## 2021-10-08 NOTE — Progress Notes (Signed)
Patient completed dialysis treatment as ordered today. 1.5 Liters removed over 3.5 hours. Patient medicated for pain during treatment, given schedule Epogen and Vancomycin. V/S stable. Patient tolerated treatment well. ?

## 2021-10-08 NOTE — Plan of Care (Signed)
?  Problem: Clinical Measurements: ?Goal: Will remain free from infection ?10/08/2021 0745 by Henrine Screws, RN ?Outcome: Progressing ?10/08/2021 0745 by Henrine Screws, RN ?Outcome: Progressing ?  ?Problem: Activity: ?Goal: Risk for activity intolerance will decrease ?10/08/2021 0745 by Henrine Screws, RN ?Outcome: Progressing ?10/08/2021 0745 by Henrine Screws, RN ?Outcome: Progressing ?  ?Problem: Pain Managment: ?Goal: General experience of comfort will improve ?10/08/2021 0745 by Henrine Screws, RN ?Outcome: Progressing ?10/08/2021 0745 by Henrine Screws, RN ?Outcome: Progressing ?  ?

## 2021-10-08 NOTE — Care Management Important Message (Signed)
Important Message ? ?Patient Details  ?Name: [name] ?MRN: [id number] ?Date of Birth: 30-Jun-1956 ? ? ?Medicare Important Message Given:  Yes ? ?Patient out of room upon time of visit.  Confirmed Medicare IM still on counter  for reference. ? ? ?Dannette Barbara ?10/08/2021, 11:25 AM ?

## 2021-10-08 NOTE — Plan of Care (Signed)
  Problem: Clinical Measurements: Goal: Will remain free from infection Outcome: Progressing   Problem: Activity: Goal: Risk for activity intolerance will decrease Outcome: Progressing   Problem: Pain Managment: Goal: General experience of comfort will improve Outcome: Progressing   

## 2021-10-09 DIAGNOSIS — N186 End stage renal disease: Secondary | ICD-10-CM | POA: Diagnosis not present

## 2021-10-09 DIAGNOSIS — Z992 Dependence on renal dialysis: Secondary | ICD-10-CM | POA: Diagnosis not present

## 2021-10-09 DIAGNOSIS — S81809A Unspecified open wound, unspecified lower leg, initial encounter: Secondary | ICD-10-CM | POA: Diagnosis not present

## 2021-10-09 MED ORDER — BUMETANIDE 1 MG PO TABS
2.0000 mg | ORAL_TABLET | ORAL | Status: DC
  Administered 2021-10-09 – 2021-10-14 (×6): 2 mg via ORAL
  Filled 2021-10-09 (×8): qty 2

## 2021-10-09 MED ORDER — METRONIDAZOLE 500 MG PO TABS
500.0000 mg | ORAL_TABLET | Freq: Once | ORAL | Status: AC
  Administered 2021-10-09: 500 mg via ORAL
  Filled 2021-10-09: qty 1

## 2021-10-09 NOTE — Progress Notes (Addendum)
? ? ? ?Progress Note  ? ? ?[name]  SNK:[id number] DOB: July 28, 1955  DOA: 10/01/2021 ?PCP: [facility]  ? ? ? ? ?Brief Narrative:  ? ? ?Medical records reviewed and are as summarized below: ? ? ?[name] is a 66 year old male with end-stage renal disease on hemodialysis, hypertension, hyperlipidemia, persistent atrial fibrillation on anticoagulation with Eliquis, GERD, BPH, chronic pain syndrome, history of noncompliance with hemodialysis, who presents emergency department for chief concerns of right lower extremity leg pain. ? ?Vitals in the emergency department showed temperature of 98.3, respiration rate of 20, heart rate of 116 and improved to 55, blood pressure 156/92, SPO2 of 93% on room air. ? ?Serum sodium 136, potassium 4.5, chloride 96, bicarb 27, BUN 45, serum creatinine of 6.79, nonfasting blood glucose 89, GFR of 8, WBC 8, hemoglobin 8, platelets of 161. ? ?Lactic acid is 1.1. ? ?ED treatment: Cefepime 2 g IV one-time dose, metronidazole 500 mg IV one-time dose, vancomycin per pharmacy.  Oxycodone 5-325 p.o. one-time dose, morphine 4 mg IV every 3 hours as needed for moderate pain ordered by EDP. ? ? ? ? ? ?Assessment/Plan:  ? ?Principal Problem: ?  Multiple open wounds of lower leg ?Active Problems: ?  HLD (hyperlipidemia) ?  Hypertension ?  ESRD on dialysis [facility]) ?  Anemia of chronic disease ?  CAD (coronary artery disease) ?  Chronic anticoagulation ?  AF (paroxysmal atrial fibrillation) ([place]) ?  Multiple open wounds of left lower leg ? ? ? ? ?Body mass index is 36.99 kg/m?.  (Obesity) ? ?Multiple open wounds of the lower legs with cellulitis: S/p I&D of bilateral lower extremity wounds on 10/07/2021.  Change IV cefepime to oral Bactrim.  Continue local wound care with wound VAC therapy.  Follow-up with vascular surgeon. ? ?Paroxysmal atrial fibrillation, CAD: Continue Eliquis ? ?ESRD on HD on MWF schedule, anemia of chronic kidney disease, secondary hyperparathyroidism:  Continue Epogen.  Follow-up with nephrologist for hemodialysis ? ?Acute urinary retention: Bladder scan showed 328 mL of urine.  Order was placed for in and out urethral catheterization per patient's request. ? ?Other comorbidities include hypertension, hyperlipidemia ? ? ? ? ?Diet Order   ? ?       ?  Diet Heart Room service appropriate? Yes; Fluid consistency: Thin  Diet effective now       ?  ? ?  ?  ? ?  ? ? ? ? ? ? ?Consultants: ?Vascular surgeon ? ?Procedures: ?None ? ? ? ?Medications:  ? ? apixaban  5 mg Oral BID  ? atorvastatin  40 mg Oral QHS  ? bumetanide  2 mg Oral 2 times per day on Sun Tue Thu Sat  ? carvedilol  6.25 mg Oral BID  ? epoetin (EPOGEN/PROCRIT) injection  4,000 Units Intravenous Q M,W,F-HD  ? metroNIDAZOLE  500 mg Oral Once  ? oxyCODONE  20 mg Oral Q12H  ? pantoprazole  80 mg Oral Daily  ? pregabalin  25 mg Oral QHS  ? [START ON 10/10/2021] sulfamethoxazole-trimethoprim  1 tablet Oral Once  ? [START ON 10/10/2021] sulfamethoxazole-trimethoprim  1 tablet Oral Q12H  ? tamsulosin  0.4 mg Oral Daily  ? ?Continuous Infusions: ? sodium chloride 10 mL/hr at 10/08/21 2209  ? ? ? ?Anti-infectives (From admission, onward)  ? ? Start     Dose/Rate Route Frequency Ordered Stop  ? 10/10/21 2200  sulfamethoxazole-trimethoprim (BACTRIM) 400-80 MG per tablet 1 tablet       ?  1 tablet Oral Every 12 hours 10/08/21 1534    ? 10/10/21 1000  sulfamethoxazole-trimethoprim (BACTRIM DS) 800-160 MG per tablet 1 tablet       ? 1 tablet Oral  Once 10/08/21 1534    ? 10/09/21 2000  metroNIDAZOLE (FLAGYL) tablet 500 mg       ? 500 mg Oral  Once 10/09/21 1313    ? 10/02/21 2200  ceFEPIme (MAXIPIME) 1 g in sodium chloride 0.9 % 100 mL IVPB       ? 1 g ?200 mL/hr over 30 Minutes Intravenous Every 24 hours 10/01/21 2351 10/08/21 2243  ? 10/02/21 0800  metroNIDAZOLE (FLAGYL) IVPB 500 mg  Status:  Discontinued       ? 500 mg ?100 mL/hr over 60 Minutes Intravenous Every 12 hours 10/01/21 2341 10/09/21 1313  ? 10/02/21 0000   vancomycin (VANCOCIN) IVPB 1000 mg/200 mL premix       ? 1,000 mg ?200 mL/hr over 60 Minutes Intravenous Every M-W-F (Hemodialysis) 10/01/21 2344 10/08/21 1308  ? 10/01/21 2230  vancomycin (VANCOCIN) IVPB 1000 mg/200 mL premix       ? 1,000 mg ?200 mL/hr over 60 Minutes Intravenous  Once 10/01/21 2121 10/02/21 0039  ? 10/01/21 2115  ceFEPIme (MAXIPIME) 2 g in sodium chloride 0.9 % 100 mL IVPB       ? 2 g ?200 mL/hr over 30 Minutes Intravenous  Once 10/01/21 2105 10/01/21 2214  ? 10/01/21 2115  metroNIDAZOLE (FLAGYL) IVPB 500 mg       ? 500 mg ?100 mL/hr over 60 Minutes Intravenous  Once 10/01/21 2105 10/01/21 2318  ? 10/01/21 2115  vancomycin (VANCOCIN) IVPB 1000 mg/200 mL premix       ? 1,000 mg ?200 mL/hr over 60 Minutes Intravenous  Once 10/01/21 2105 10/01/21 2323  ? ?  ? ? ? ? ? ? ? ? ? ?Family Communication/Anticipated D/C date and plan/Code Status  ? ?DVT prophylaxis:  ?apixaban (ELIQUIS) tablet 5 mg  ? ?  Code Status: Full Code ? ?Family Communication: None ?Disposition Plan: Plan to discharge home in 2 to 3 days ? ? ?Status is: Inpatient ?Remains inpatient appropriate because: IV antibiotics ? ? ? ? ? ?Subjective:  ? ?He complains of lower abdominal pain and urinary retention.  He said he has not urinated for "4 days ".  He requested a Foley catheter.  Pain in the legs is improving. ? ?Objective:  ? ? ?Vitals:  ? 10/08/21 1537 10/08/21 2210 10/09/21 0300 10/09/21 0857  ?BP: 113/67 119/67 127/65 119/75  ?Pulse: 75 75 96 82  ?Resp: 16 19 19 18   ?Temp: 97.9 ?F (36.6 ?C) 98 ?F (36.7 ?C) 98 ?F (36.7 ?C) 98.8 ?F (37.1 ?C)  ?TempSrc: Oral  Oral Oral  ?SpO2: 99% 96% 93% 92%  ?Weight:      ?Height:      ? ?No data found. ? ? ?Intake/Output Summary (Last 24 hours) at 10/09/2021 1505 ?Last data filed at 10/09/2021 1300 ?Gross per 24 hour  ?Intake 240 ml  ?Output 600 ml  ?Net -360 ml  ? ?Filed Weights  ? 10/07/21 1405 10/08/21 0920 10/08/21 1321  ?Weight: 121.5 kg 124.6 kg 123.7 kg  ? ? ?Exam: ? ?GEN: NAD ?SKIN: Wounds on  bilateral legs connected to wound vac ?EYES: No pallor or icterus ?ENT: MMM ?CV: RRR ?PULM: CTA B ?ABD: soft, obese, NT, +BS ?CNS: AAO x 3, non focal ?EXT: Bilateral leg edema, bilateral leg tenderness ? ? ? ? ? ? ?  ? ? ?  Data Reviewed:  ? ?I have personally reviewed following labs and imaging studies: ? ?Labs: ?Labs show the following:  ? ?Basic Metabolic Panel: ?Recent Labs  ?Lab 10/04/21 ?0522 10/06/21 ?0435 10/07/21 ?0923  ?NA 136 134* 133*  ?K 5.5* 4.9 4.1  ?CL 98 97* 94*  ?CO2 22 25 26   ?GLUCOSE 90 87 98  ?BUN 71* 55* 34*  ?CREATININE 9.58* 7.89* 5.40*  ?CALCIUM 8.2* 8.7* 8.5*  ?PHOS  --  7.5*  --   ? ?GFR ?Estimated Creatinine Clearance: 19 mL/min (A) (by C-G formula based on SCr of 5.4 mg/dL (H)). ?Liver Function Tests: ?Recent Labs  ?Lab 10/04/21 ?0522 10/06/21 ?0435  ?AST 15  --   ?ALT 22  --   ?ALKPHOS 135*  --   ?BILITOT 1.1  --   ?PROT 7.4  --   ?ALBUMIN 2.8* 2.7*  ? ?No results for input(s): LIPASE, AMYLASE in the last 168 hours. ?No results for input(s): AMMONIA in the last 168 hours. ?Coagulation profile ?No results for input(s): INR, PROTIME in the last 168 hours. ? ?CBC: ?Recent Labs  ?Lab 10/04/21 ?0522 10/05/21 ?3007 10/06/21 ?0435 10/07/21 ?6226 10/08/21 ?3335  ?WBC 7.1 7.2 6.3 9.6 13.4*  ?HGB 7.7* 7.9* 7.8* 8.1* 7.8*  ?HCT 23.8* 24.6* 23.5* 24.8* 23.8*  ?MCV 85.6 86.6 85.1 86.1 87.2  ?PLT 142* 130* 137* 128* 124*  ? ?Cardiac Enzymes: ?No results for input(s): CKTOTAL, CKMB, CKMBINDEX, TROPONINI in the last 168 hours. ?BNP (last 3 results) ?No results for input(s): PROBNP in the last 8760 hours. ?CBG: ?No results for input(s): GLUCAP in the last 168 hours. ?D-Dimer: ?No results for input(s): DDIMER in the last 72 hours. ?Hgb A1c: ?No results for input(s): HGBA1C in the last 72 hours. ?Lipid Profile: ?No results for input(s): CHOL, HDL, LDLCALC, TRIG, CHOLHDL, LDLDIRECT in the last 72 hours. ?Thyroid function studies: ?No results for input(s): TSH, T4TOTAL, T3FREE, THYROIDAB in the last 72  hours. ? ?Invalid input(s): FREET3 ?Anemia work up: ?No results for input(s): VITAMINB12, FOLATE, FERRITIN, TIBC, IRON, RETICCTPCT in the last 72 hours. ?Sepsis Labs: ?Recent Labs  ?Lab 10/05/21 ?4562 10/06/21 ?

## 2021-10-09 NOTE — Progress Notes (Signed)
[facility] ?[place], [place] ?10/09/21 ? ?Subjective:  ? ?Hospital day # 7 ? ?Patient known to our practice from previous admissions  ?This time patient presents for right lower extremity pain from the wound. ?Treated with broad-spectrum antibiotics including cefepime, metronidazole and vancomycin in the emergency room. ?Admitted for wound evaluation by surgical services and IV antibiotics. ? ?Update ?Patient resting in bed ?Wound vacs remain in place ?Continues to complain of pain, but states it's managed with medications ?Tolerating meals ?Complains of decreased urination.  ? ?Objective:  ?Vital signs in last 24 hours:  ?Temp:  [97.7 ?F (36.5 ?C)-98.8 ?F (37.1 ?C)] 98.8 ?F (37.1 ?C) (04/01 0857) ?Pulse Rate:  [75-96] 82 (04/01 0857) ?Resp:  [11-29] 18 (04/01 0857) ?BP: (107-135)/(60-90) 119/75 (04/01 0857) ?SpO2:  [92 %-99 %] 92 % (04/01 0857) ?Weight:  [123.7 kg] 123.7 kg (03/31 1321) ? ?Weight change: 3.1 kg ?Filed Weights  ? 10/07/21 1405 10/08/21 0920 10/08/21 1321  ?Weight: 121.5 kg 124.6 kg 123.7 kg  ? ? ?Intake/Output: ?  ? ?Intake/Output Summary (Last 24 hours) at 10/09/2021 1017 ?Last data filed at 10/09/2021 6767 ?Gross per 24 hour  ?Intake --  ?Output 1776 ml  ?Net -1776 ml  ? ? ? ? ?Physical Exam: ?General: NAD  ?HEENT Anicteric, moist oral mucous membranes  ?Pulm/lungs Normal breathing effort on room air  ?CVS/Heart No rub or gallop, irregular  ?Abdomen:  Soft  ?Extremities: 2+  pitting edema bilateral lower extremities, Bilat LE NPWV  ?Neurologic: Alert oriented  ?Skin: skin changes from chronic edema bilaterally  ?Access: Left upper arm AV fistula, good thrill  ?   ? ? ?Basic Metabolic Panel: ? ?Recent Labs  ?Lab 10/04/21 ?0522 10/06/21 ?0435 10/07/21 ?2094  ?NA 136 134* 133*  ?K 5.5* 4.9 4.1  ?CL 98 97* 94*  ?CO2 22 25 26   ?GLUCOSE 90 87 98  ?BUN 71* 55* 34*  ?CREATININE 9.58* 7.89* 5.40*  ?CALCIUM 8.2* 8.7* 8.5*  ?PHOS  --  7.5*  --   ? ? ? ? ?CBC: ?Recent Labs  ?Lab 10/04/21 ?0522  10/05/21 ?7096 10/06/21 ?0435 10/07/21 ?2836 10/08/21 ?6294  ?WBC 7.1 7.2 6.3 9.6 13.4*  ?HGB 7.7* 7.9* 7.8* 8.1* 7.8*  ?HCT 23.8* 24.6* 23.5* 24.8* 23.8*  ?MCV 85.6 86.6 85.1 86.1 87.2  ?PLT 142* 130* 137* 128* 124*  ? ? ? ?  ?Lab Results  ?Component Value Date  ? HEPBSAG NON REACTIVE 10/04/2021  ? HEPBSAB Reactive (A) 10/04/2021  ? HEPBIGM Negative 11/15/2018  ? ? ? ? ?Microbiology: ? ?Recent Results (from the past 240 hour(s))  ?Resp Panel by RT-PCR (Flu A&B, Covid) Nasopharyngeal Swab     Status: None  ? Collection Time: 10/01/21  9:41 PM  ? Specimen: Nasopharyngeal Swab; Nasopharyngeal(NP) swabs in vial transport medium  ?Result Value Ref Range Status  ? SARS Coronavirus 2 by RT PCR NEGATIVE NEGATIVE Final  ?  Comment: (NOTE) ?SARS-CoV-2 target nucleic acids are NOT DETECTED. ? ?The SARS-CoV-2 RNA is generally detectable in upper respiratory ?specimens during the acute phase of infection. The lowest ?concentration of SARS-CoV-2 viral copies this assay can detect is ?138 copies/mL. A negative result does not preclude SARS-Cov-2 ?infection and should not be used as the sole basis for treatment or ?other patient management decisions. A negative result may occur with  ?improper specimen collection/handling, submission of specimen other ?than nasopharyngeal swab, presence of viral mutation(s) within the ?areas targeted by this assay, and inadequate number of viral ?copies(<138 copies/mL). A negative  result must be combined with ?clinical observations, patient history, and epidemiological ?information. The expected result is Negative. ? ?Fact Sheet for Patients:  ?EntrepreneurPulse.com.au ? ?Fact Sheet for Healthcare Providers:  ?IncredibleEmployment.be ? ?This test is no t yet approved or cleared by the [place] FDA and  ?has been authorized for detection and/or diagnosis of SARS-CoV-2 by ?FDA under an Emergency Use Authorization (EUA). This EUA will remain  ?in effect  (meaning this test can be used) for the duration of the ?COVID-19 declaration under Section 564(b)(1) of the Act, 21 ?U.S.C.section 360bbb-3(b)(1), unless the authorization is terminated  ?or revoked sooner.  ? ? ?  ? Influenza A by PCR NEGATIVE NEGATIVE Final  ? Influenza B by PCR NEGATIVE NEGATIVE Final  ?  Comment: (NOTE) ?The Xpert Xpress SARS-CoV-2/FLU/RSV plus assay is intended as an aid ?in the diagnosis of influenza from Nasopharyngeal swab specimens and ?should not be used as a sole basis for treatment. Nasal washings and ?aspirates are unacceptable for Xpert Xpress SARS-CoV-2/FLU/RSV ?testing. ? ?Fact Sheet for Patients: ?EntrepreneurPulse.com.au ? ?Fact Sheet for Healthcare Providers: ?IncredibleEmployment.be ? ?This test is not yet approved or cleared by the [place] FDA and ?has been authorized for detection and/or diagnosis of SARS-CoV-2 by ?FDA under an Emergency Use Authorization (EUA). This EUA will remain ?in effect (meaning this test can be used) for the duration of the ?COVID-19 declaration under Section 564(b)(1) of the Act, 21 U.S.C. ?section 360bbb-3(b)(1), unless the authorization is terminated or ?revoked. ? ?Performed at [facility], [place], ?[place] [address] ?  ?MRSA Next Gen by PCR, Nasal     Status: Abnormal  ? Collection Time: 10/02/21  2:55 AM  ? Specimen: Nasal Mucosa; Nasal Swab  ?Result Value Ref Range Status  ? MRSA by PCR Next Gen DETECTED (A) NOT DETECTED Final  ?  Comment: RESULT CALLED TO, READ BACK BY AND VERIFIED WITH: ?MELISSA COBB AT 0422 10/02/21.PMF ?(NOTE) ?The GeneXpert MRSA Assay (FDA approved for NASAL specimens only), ?is one component of a comprehensive MRSA colonization surveillance ?program. It is not intended to diagnose MRSA infection nor to guide ?or monitor treatment for MRSA infections. ?Test performance is not FDA approved in patients less than 2 years ?old. ?Performed at [facility], [place], ?[place] [address] ?  ? ? ?Coagulation Studies: ?No results for input(s): LABPROT, INR in the last 72 hours. ? ?Urinalysis: ?No results for input(s): COLORURINE, LABSPEC, [place], GLUCOSEU, HGBUR, BILIRUBINUR, KETONESUR, PROTEINUR, UROBILINOGEN, NITRITE, LEUKOCYTESUR in the last 72 hours. ? ?Invalid input(s): APPERANCEUR  ? ? ?Imaging: ?No results found. ? ? ?Medications:  ? ? sodium chloride 10 mL/hr at 10/08/21 2209  ? metronidazole 500 mg (10/09/21 2778)  ? ? apixaban  5 mg Oral BID  ? atorvastatin  40 mg Oral QHS  ? bumetanide  2 mg Oral 2 times per day on Sun Tue Thu Sat  ? carvedilol  6.25 mg Oral BID  ? epoetin (EPOGEN/PROCRIT) injection  4,000 Units Intravenous Q M,W,F-HD  ? oxyCODONE  20 mg Oral Q12H  ? pantoprazole  80 mg Oral Daily  ? pregabalin  25 mg Oral QHS  ? [START ON 10/10/2021] sulfamethoxazole-trimethoprim  1 tablet Oral Once  ? [START ON 10/10/2021] sulfamethoxazole-trimethoprim  1 tablet Oral Q12H  ? tamsulosin  0.4 mg Oral Daily  ? ?sodium chloride, albuterol, HYDROmorphone (DILAUDID) injection, labetalol, oxyCODONE, polyethylene glycol ? ?Assessment/ Plan:  ?66 y.o. male with end-stage renal disease, hypertension, atrial fibrillation, coronary  artery disease, pulmonary embolism, hyperlipidemia, polysubstance abuse, history of chronic pain syndrome ?Admission at [facility] for acute respiratory failure, COVID-19  in January 2023 ?This time he is admitted on 10/01/2021 for ?Right leg pain [M79.604] ?Cellulitis of right lower extremity [L03.115] ?Multiple open wounds of lower leg [S81.809A] ?Multiple open wounds of left lower leg [S81.802A] ? ?Outpatient hemodialysis: [facility] dialysis.  240 minutes.  Left arm AV fistula.  15-gauge needles.  3 times per week.  MWF.  Target weight 120 kg. ? ?#End-stage renal disease with LE edema ?Dialysis received yesterday, UF 1.5L achieved. Will increase to Bumex 2mg  on non-dialysis day. Next dialysis scheduled for Monday.   ? ? ?#Anemia of chronic kidney disease ?Lab Results  ?Component Value Date  ? HGB 7.8 (L) 10/08/2021  ?Mircera received outpatient ?Low dose EPO ordered with dialysis treatments ? ?#Secondary hyperparathyroid

## 2021-10-09 NOTE — Progress Notes (Signed)
PHARMACIST - PHYSICIAN COMMUNICATION ? ?CONCERNING: Antibiotic IV to Oral Route Change Policy ? ?RECOMMENDATION: ?This patient is receiving metronidazole by the intravenous route.  Based on criteria approved by the Pharmacy and Therapeutics Committee, the antibiotic(s) is/are being converted to the equivalent oral dose form(s). ? ? ?DESCRIPTION: ?These criteria include: ?Patient being treated for a respiratory tract infection, urinary tract infection, cellulitis or clostridium difficile associated diarrhea if on metronidazole ?The patient is not neutropenic and does not exhibit a GI malabsorption state ?The patient is eating (either orally or via tube) and/or has been taking other orally administered medications for a least 24 hours ?The patient is improving clinically and has a Tmax < 100.5 ? ?If you have questions about this conversion, please contact the Pharmacy Department  ? ?Benita Gutter  ?10/09/21  ?  ?

## 2021-10-10 DIAGNOSIS — S81809A Unspecified open wound, unspecified lower leg, initial encounter: Secondary | ICD-10-CM | POA: Diagnosis not present

## 2021-10-10 NOTE — Evaluation (Addendum)
Physical Therapy Evaluation ?Patient Details ?Name: [name] ?MRN: [id number] ?DOB: December 18, 1955 ?Today's Date: 10/10/2021 ? ?History of Present Illness ? Pt is a 66 y.o. male who presents to ED for evaluation of acutely worsening right leg pain swelling drainage; reports having warm to touch and increased drainage from wound area. Currently admitted s/p irrigation and debridement of extensive LE wounds bilaterally and bilateral VAC dressing placements. PmHx: HLD, HTN, ESRD on Dialysis, Anemia, CAD, Paroxysmal A-Fib. ?  ?Clinical Impression ? Pt sitting EOB working w/ OT upon PT entrance into room for evaluation today. Pt seen as a co-evaluation today for patient/therapist safety and level of assistance needed for transfers and mobility. PLOF and home set-up obtained by OT prior to entrance into room; documented from pt chart history.  ? ?Pt performs sit to stand from EOB w/ modAx2 using RW. Once standing he is able to progress to minAx2 to step-pivot transfer to recliner. Notable grimacing and moaning throughout due to increase in BLE pain. Pt reports he is able to perform all transfers at home on his own, but currently due to pain he feels he has some generalized weakness. Pt left in recliner w/ all needs w/in reach prior to PT exiting room. Pt will benefit from continued skilled PT in order to increase LE strength, improve functional mobility, decrease c/o pain, and restore PLOF. Current discharge recommendation to SNF is appropriate due to the level of assistance required by the patient to ensure safety and improve overall function. ? ?   ? ?Recommendations for follow up therapy are one component of a multi-disciplinary discharge planning process, led by the attending physician.  Recommendations may be updated based on patient status, additional functional criteria and insurance authorization. ? ?Follow Up Recommendations Skilled nursing-short term rehab (<3 hours/day) ? ?  ?Assistance Recommended at  Discharge Intermittent Supervision/Assistance  ?Patient can return home with the following ? A little help with walking and/or transfers;A little help with bathing/dressing/bathroom;Assistance with cooking/housework;Assist for transportation;Help with stairs or ramp for entrance ? ?  ?Equipment Recommendations Rolling walker (2 wheels)  ?Recommendations for Other Services ?    ?  ?Functional Status Assessment Patient has had a recent decline in their functional status and demonstrates the ability to make significant improvements in function in a reasonable and predictable amount of time.  ? ?  ?Precautions / Restrictions Precautions ?Precautions: Fall ?Restrictions ?Weight Bearing Restrictions: No  ? ?  ? ?Mobility ? Bed Mobility ?  ?  ?  ?  ?  ?  ?  ?  ?  ? ?Transfers ?  ?  ?  ?  ?  ?  ?  ?  ?  ?  ?  ? ?Ambulation/Gait ?  ?  ?  ?  ?  ?  ?  ?  ? ?Stairs ?  ?  ?  ?  ?  ? ?Wheelchair Mobility ?  ? ?Modified Rankin (Stroke Patients Only) ?  ? ?  ? ?Balance Overall balance assessment: Needs assistance ?Sitting-balance support: Feet supported ?Sitting balance-Leahy Scale: Fair ?  ?  ?Standing balance support: Reliant on assistive device for balance ?Standing balance-Leahy Scale: Fair ?  ?  ?  ?  ?  ?  ?  ?  ?  ?  ?  ?  ?   ? ? ? ?Pertinent Vitals/Pain    ? ? ?Home Living Family/patient expects to be discharged to:: Private residence ?Living Arrangements: Parent (Lives with Mom & Dad who are in late  80s/early 90s) ?Available Help at Discharge: Family;Available PRN/intermittently (unable to provide physical assist) ?Type of Home: House ?Home Access: Ramped entrance ?  ?  ?  ?Home Layout: One level ?Home Equipment: Rollator (4 wheels);Grab bars - tub/shower;Shower seat ?   ?  ?Prior Function Prior Level of Function : Needs assist ?  ?  ?  ?  ?  ?  ?Mobility Comments: pt is a questionable historian. Reports that he uses a RW for functional mobility, but later on reports that he cannot recall last time he stood up ?ADLs  Comments: pt is a questionable historian. Reports that he is indepedent with ADLs and shower transfers ?  ? ? ?Hand Dominance  ?   ? ?  ?Extremity/Trunk Assessment  ? Upper Extremity Assessment ?Upper Extremity Assessment: Overall WFL for tasks assessed ?  ? ?Lower Extremity Assessment ?Lower Extremity Assessment: Overall WFL for tasks assessed;Generalized weakness ?  ? ?   ?Communication  ? Communication: No difficulties  ?Cognition Arousal/Alertness: Awake/alert ?Behavior During Therapy: Anxious ?Overall Cognitive Status: No family/caregiver present to determine baseline cognitive functioning ?  ?  ?  ?  ?  ?  ?  ?  ?  ?  ?  ?  ?  ?  ?  ?  ?General Comments: difficulty with sustained attention/ability to remain on task, perseveration noted ?  ?  ? ?  ?General Comments   ? ?  ?Exercises    ? ?Assessment/Plan  ?  ?PT Assessment Patient needs continued PT services  ?PT Problem List Decreased strength;Decreased mobility;Decreased safety awareness;Decreased range of motion;Decreased coordination;Decreased skin integrity;Decreased activity tolerance;Decreased balance;Pain ? ?   ?  ?PT Treatment Interventions DME instruction;Therapeutic exercise;Gait training;Balance training;Stair training;Neuromuscular re-education;Functional mobility training;Therapeutic activities;Patient/family education   ? ?PT Goals (Current goals can be found in the Care Plan section)  ?Acute Rehab PT Goals ?Patient Stated Goal: to decrease leg pain and get stronger to go home ?PT Goal Formulation: With patient ?Time For Goal Achievement: 10/24/21 ?Potential to Achieve Goals: Fair ?Additional Goals ?Additional Goal #1: Pt will be able to complete a tug <30sec ? ?  ?Frequency Min 2X/week ?  ? ? ?Co-evaluation PT/OT/SLP Co-Evaluation/Treatment: Yes ?Reason for Co-Treatment: Complexity of the patient's impairments (multi-system involvement);Necessary to address cognition/behavior during functional activity;For patient/therapist safety;To address  functional/ADL transfers ?PT goals addressed during session: Mobility/safety with mobility;Balance ?OT goals addressed during session: ADL's and self-care;Proper use of Adaptive equipment and DME ?  ? ? ?  ?AM-PAC PT "6 Clicks" Mobility  ?Outcome Measure Help needed turning from your back to your side while in a flat bed without using bedrails?: A Little ?Help needed moving from lying on your back to sitting on the side of a flat bed without using bedrails?: A Little ?Help needed moving to and from a bed to a chair (including a wheelchair)?: A Little ?Help needed standing up from a chair using your arms (e.g., wheelchair or bedside chair)?: A Little ?Help needed to walk in hospital room?: A Lot ?Help needed climbing 3-5 steps with a railing? : Total ?6 Click Score: 15 ? ?  ?End of Session   ?Activity Tolerance: Patient limited by pain ?Patient left: with chair alarm set;in chair;with call bell/phone within reach ?Nurse Communication: Mobility status ?PT Visit Diagnosis: Unsteadiness on feet (R26.81);Pain;Muscle weakness (generalized) (M62.81) ?Pain - Right/Left:  (bilaterally) ?Pain - part of body: Leg;Ankle and joints of foot ?  ? ?Time: 6213-0865 ?PT Time Calculation (min) (ACUTE ONLY): 15 min ? ? ?Charges:  PT Evaluation ?$PT Eval Low Complexity: 1 Low ?  ?  ?   ? ?Jonnie Kind, SPT ?10/10/2021, 12:44 PM ? ?

## 2021-10-10 NOTE — Progress Notes (Addendum)
? ? ? ?Progress Note  ? ? ?THANE AGE  NWG:[id number] DOB: April 05, 1956  DOA: 10/01/2021 ?PCP: [facility]  ? ? ? ? ?Brief Narrative:  ? ? ?Medical records reviewed and are as summarized below: ? ? ?[name] is a 66 year old male with end-stage renal disease on hemodialysis, hypertension, hyperlipidemia, persistent atrial fibrillation on anticoagulation with Eliquis, GERD, BPH, chronic pain syndrome, history of noncompliance with hemodialysis, who presents emergency department for chief concerns of right lower extremity leg pain. ? ?Vitals in the emergency department showed temperature of 98.3, respiration rate of 20, heart rate of 116 and improved to 55, blood pressure 156/92, SPO2 of 93% on room air. ? ?Serum sodium 136, potassium 4.5, chloride 96, bicarb 27, BUN 45, serum creatinine of 6.79, nonfasting blood glucose 89, GFR of 8, WBC 8, hemoglobin 8, platelets of 161. ? ?Lactic acid is 1.1. ? ?ED treatment: Cefepime 2 g IV one-time dose, metronidazole 500 mg IV one-time dose, vancomycin per pharmacy.  Oxycodone 5-325 p.o. one-time dose, morphine 4 mg IV every 3 hours as needed for moderate pain ordered by EDP. ? ? ? ? ? ?Assessment/Plan:  ? ?Principal Problem: ?  Multiple open wounds of lower leg ?Active Problems: ?  HLD (hyperlipidemia) ?  Hypertension ?  ESRD on dialysis [facility]) ?  Anemia of chronic disease ?  CAD (coronary artery disease) ?  Chronic anticoagulation ?  AF (paroxysmal atrial fibrillation) ([place]) ?  Multiple open wounds of left lower leg ? ? ? ? ?Body mass index is 36.99 kg/m?.  (Obesity) ? ?Multiple open wounds of the lower legs with cellulitis: S/p I&D of bilateral lower extremity wounds on 10/07/2021.  Continue Bactrim.  Continue local wound care with wound VAC therapy.  Follow-up with vascular surgeon. ? ?Paroxysmal atrial fibrillation, CAD: Continue Eliquis ? ?ESRD on HD on MWF schedule, anemia of chronic kidney disease, secondary hyperparathyroidism: Continue Epogen.   Follow-up with nephrologist for hemodialysis ? ?Acute urinary retention: Bladder scan showed 328 mL of urine.  Order was placed for in and out urethral catheterization per patient's request. ? ?Generalized weakness: PT recommends discharge to SNF.  Follow-up with social worker to assist with placement. ? ?Other comorbidities include hypertension, hyperlipidemia ? ? ? ? ?Diet Order   ? ?       ?  Diet Heart Room service appropriate? Yes; Fluid consistency: Thin  Diet effective now       ?  ? ?  ?  ? ?  ? ? ? ? ? ? ?Consultants: ?Vascular surgeon ? ?Procedures: ?I&D of extensive bilateral leg wounds on 10/07/2021 ? ? ? ?Medications:  ? ? apixaban  5 mg Oral BID  ? atorvastatin  40 mg Oral QHS  ? bumetanide  2 mg Oral 2 times per day on Sun Tue Thu Sat  ? carvedilol  6.25 mg Oral BID  ? epoetin (EPOGEN/PROCRIT) injection  4,000 Units Intravenous Q M,W,F-HD  ? oxyCODONE  20 mg Oral Q12H  ? pantoprazole  80 mg Oral Daily  ? pregabalin  25 mg Oral QHS  ? sulfamethoxazole-trimethoprim  1 tablet Oral Q12H  ? tamsulosin  0.4 mg Oral Daily  ? ?Continuous Infusions: ? sodium chloride 10 mL/hr at 10/08/21 2209  ? ? ? ?Anti-infectives (From admission, onward)  ? ? Start     Dose/Rate Route Frequency Ordered Stop  ? 10/10/21 2200  sulfamethoxazole-trimethoprim (BACTRIM) 400-80 MG per tablet 1 tablet       ?  1 tablet Oral Every 12 hours 10/08/21 1534    ? 10/10/21 1000  sulfamethoxazole-trimethoprim (BACTRIM DS) 800-160 MG per tablet 1 tablet       ? 1 tablet Oral  Once 10/08/21 1534 10/10/21 0825  ? 10/09/21 2000  metroNIDAZOLE (FLAGYL) tablet 500 mg       ? 500 mg Oral  Once 10/09/21 1313 10/09/21 2028  ? 10/02/21 2200  ceFEPIme (MAXIPIME) 1 g in sodium chloride 0.9 % 100 mL IVPB       ? 1 g ?200 mL/hr over 30 Minutes Intravenous Every 24 hours 10/01/21 2351 10/08/21 2243  ? 10/02/21 0800  metroNIDAZOLE (FLAGYL) IVPB 500 mg  Status:  Discontinued       ? 500 mg ?100 mL/hr over 60 Minutes Intravenous Every 12 hours 10/01/21 2341  10/09/21 1313  ? 10/02/21 0000  vancomycin (VANCOCIN) IVPB 1000 mg/200 mL premix       ? 1,000 mg ?200 mL/hr over 60 Minutes Intravenous Every M-W-F (Hemodialysis) 10/01/21 2344 10/08/21 1308  ? 10/01/21 2230  vancomycin (VANCOCIN) IVPB 1000 mg/200 mL premix       ? 1,000 mg ?200 mL/hr over 60 Minutes Intravenous  Once 10/01/21 2121 10/02/21 0039  ? 10/01/21 2115  ceFEPIme (MAXIPIME) 2 g in sodium chloride 0.9 % 100 mL IVPB       ? 2 g ?200 mL/hr over 30 Minutes Intravenous  Once 10/01/21 2105 10/01/21 2214  ? 10/01/21 2115  metroNIDAZOLE (FLAGYL) IVPB 500 mg       ? 500 mg ?100 mL/hr over 60 Minutes Intravenous  Once 10/01/21 2105 10/01/21 2318  ? 10/01/21 2115  vancomycin (VANCOCIN) IVPB 1000 mg/200 mL premix       ? 1,000 mg ?200 mL/hr over 60 Minutes Intravenous  Once 10/01/21 2105 10/01/21 2323  ? ?  ? ? ? ? ? ? ? ? ? ?Family Communication/Anticipated D/C date and plan/Code Status  ? ?DVT prophylaxis:  ?apixaban (ELIQUIS) tablet 5 mg  ? ?  Code Status: Full Code ? ?Family Communication: None ?Disposition Plan: Plan to discharge to SNF in 2 to 3 days ? ? ?Status is: Inpatient ?Remains inpatient appropriate because: Awaiting placement to SNF ? ? ? ? ? ?Subjective:  ? ?He complains of pain in the legs although overall it is better. ? ?Objective:  ? ? ?Vitals:  ? 10/09/21 1945 10/09/21 2226 10/10/21 0444 10/10/21 0818  ?BP:   121/80 114/84  ?Pulse: 83  (!) 54 65  ?Resp:   20 18  ?Temp:   97.8 ?F (36.6 ?C) 97.9 ?F (36.6 ?C)  ?TempSrc:   Oral   ?SpO2: 96% 97% 95% 100%  ?Weight:      ?Height:      ? ?No data found. ? ? ?Intake/Output Summary (Last 24 hours) at 10/10/2021 1327 ?Last data filed at 10/10/2021 0700 ?Gross per 24 hour  ?Intake 240 ml  ?Output 60 ml  ?Net 180 ml  ? ?Filed Weights  ? 10/07/21 1405 10/08/21 0920 10/08/21 1321  ?Weight: 121.5 kg 124.6 kg 123.7 kg  ? ? ?Exam: ? ?GEN: NAD ?SKIN: Wounds on bilateral legs connected to wound VAC ?EYES: No pallor or icterus ?ENT: MMM ?CV: RRR ?PULM: CTA B ?ABD: soft,  obese, NT, +BS ?CNS: AAO x 3, non focal ?EXT: Bilateral leg edema with mild tenderness ? ? ? ? ? ? ?  ? ? ?Data Reviewed:  ? ?I have personally reviewed following labs and imaging studies: ? ?Labs: ?Labs show  the following:  ? ?Basic Metabolic Panel: ?Recent Labs  ?Lab 10/04/21 ?0522 10/06/21 ?0435 10/07/21 ?8832  ?NA 136 134* 133*  ?K 5.5* 4.9 4.1  ?CL 98 97* 94*  ?CO2 22 25 26   ?GLUCOSE 90 87 98  ?BUN 71* 55* 34*  ?CREATININE 9.58* 7.89* 5.40*  ?CALCIUM 8.2* 8.7* 8.5*  ?PHOS  --  7.5*  --   ? ?GFR ?Estimated Creatinine Clearance: 19 mL/min (A) (by C-G formula based on SCr of 5.4 mg/dL (H)). ?Liver Function Tests: ?Recent Labs  ?Lab 10/04/21 ?0522 10/06/21 ?0435  ?AST 15  --   ?ALT 22  --   ?ALKPHOS 135*  --   ?BILITOT 1.1  --   ?PROT 7.4  --   ?ALBUMIN 2.8* 2.7*  ? ?No results for input(s): LIPASE, AMYLASE in the last 168 hours. ?No results for input(s): AMMONIA in the last 168 hours. ?Coagulation profile ?No results for input(s): INR, PROTIME in the last 168 hours. ? ?CBC: ?Recent Labs  ?Lab 10/04/21 ?0522 10/05/21 ?5498 10/06/21 ?0435 10/07/21 ?2641 10/08/21 ?5830  ?WBC 7.1 7.2 6.3 9.6 13.4*  ?HGB 7.7* 7.9* 7.8* 8.1* 7.8*  ?HCT 23.8* 24.6* 23.5* 24.8* 23.8*  ?MCV 85.6 86.6 85.1 86.1 87.2  ?PLT 142* 130* 137* 128* 124*  ? ?Cardiac Enzymes: ?No results for input(s): CKTOTAL, CKMB, CKMBINDEX, TROPONINI in the last 168 hours. ?BNP (last 3 results) ?No results for input(s): PROBNP in the last 8760 hours. ?CBG: ?No results for input(s): GLUCAP in the last 168 hours. ?D-Dimer: ?No results for input(s): DDIMER in the last 72 hours. ?Hgb A1c: ?No results for input(s): HGBA1C in the last 72 hours. ?Lipid Profile: ?No results for input(s): CHOL, HDL, LDLCALC, TRIG, CHOLHDL, LDLDIRECT in the last 72 hours. ?Thyroid function studies: ?No results for input(s): TSH, T4TOTAL, T3FREE, THYROIDAB in the last 72 hours. ? ?Invalid input(s): FREET3 ?Anemia work up: ?No results for input(s): VITAMINB12, FOLATE, FERRITIN, TIBC, IRON,  RETICCTPCT in the last 72 hours. ?Sepsis Labs: ?Recent Labs  ?Lab 10/05/21 ?9407 10/06/21 ?0435 10/07/21 ?6808 10/08/21 ?8110  ?WBC 7.2 6.3 9.6 13.4*  ? ? ?Microbiology ?Recent Results (from the past 240

## 2021-10-10 NOTE — Evaluation (Signed)
Occupational Therapy Evaluation ?Patient Details ?Name: [name] ?MRN: [id number] ?DOB: 09-03-1955 ?Today's Date: 10/10/2021 ? ? ?History of Present Illness Pt is a 66 y.o. male who presents to ED for evaluation of acutely worsening right leg pain swelling drainage; reports having warm to touch and increased drainage from wound area. Currently admitted s/p irrigation and debridement of extensive LE wounds bilaterally and bilateral VAC dressing placements. PmHx: HLD, HTN, ESRD on Dialysis, Anemia, CAD, Paroxysmal A-Fib.  ? ?Clinical Impression ?  ?Pt seen for OT evaluation this date (functional mobility portion of session coordinated with PT to address functional/ADL transfers). Pt provided pain medication 20 mins prior to this author's arrival. Pt reporting significant b/l LE pain, however agreeable to OT eval and vocalized goal to get out of bed. Pt is a questionable historian; pt reported that he was independent with ADLs and used a 4WW for functional mobility prior to admission however later in session reported that he could not recall the last time he stood. Pt reports that he has been staying with his parents in their 1-level home with ramped entrance. Pt currently presents with b/l LE pain, decreased balance, and decreased strength. Due to these current functional impairments, pt requires MOD A for bed mobility, SUPERVISION/SET-UP for seated UB dressing, MAX A for seated LB dressing, MOD A+2 for sit>stand transfers, and MIN A+2 for step pivot transfer bed>chair. Pt left sitting upright in recliner with PT continuing PT session. Pt would benefit from additional skilled OT services to maximize return to PLOF and minimize risk of future falls, injury, caregiver burden, and readmission. Upon discharge, recommend SNF.   ? ?Recommendations for follow up therapy are one component of a multi-disciplinary discharge planning process, led by the attending physician.  Recommendations may be updated based on patient  status, additional functional criteria and insurance authorization.  ? ?Follow Up Recommendations ? Skilled nursing-short term rehab (<3 hours/day)  ?  ?   ?Patient can return home with the following Two people to help with walking and/or transfers;A lot of help with bathing/dressing/bathroom;Assistance with cooking/housework;Help with stairs or ramp for entrance ? ?  ?Functional Status Assessment ? Patient has had a recent decline in their functional status and demonstrates the ability to make significant improvements in function in a reasonable and predictable amount of time.  ?Equipment Recommendations ? Other (comment) (defer to next venue of care)  ?  ?   ?Precautions / Restrictions Precautions ?Precautions: Fall ?Restrictions ?Weight Bearing Restrictions: No  ? ?  ? ?Mobility Bed Mobility ?Overal bed mobility: Needs Assistance ?Bed Mobility: Supine to Sit ?  ?  ?Supine to sit: Mod assist, HOB elevated ?  ?  ?General bed mobility comments: Requires assist for managing BLE ?  ? ?Transfers ?Overall transfer level: Needs assistance ?Equipment used: Rolling walker (2 wheels) ?Transfers: Sit to/from Stand, Bed to chair/wheelchair/BSC ?Sit to Stand: Mod assist, +2 physical assistance ?  ?  ?Step pivot transfers: Min assist, +2 physical assistance ?  ?  ?  ?  ? ?  ?Balance Overall balance assessment: Needs assistance ?Sitting-balance support: No upper extremity supported, Feet supported ?Sitting balance-Leahy Scale: Fair ?Sitting balance - Comments: Requires supervision for seated UB ADLs while seated EOB ?  ?Standing balance support: Bilateral upper extremity supported, During functional activity, Reliant on assistive device for balance ?Standing balance-Leahy Scale: Poor ?Standing balance comment: Requres MIN A +2 for step pivot transfer bed>recliner ?  ?  ?  ?  ?  ?  ?  ?  ?  ?  ?  ?   ? ?  ADL either performed or assessed with clinical judgement  ? ?ADL Overall ADL's : Needs assistance/impaired ?  ?  ?  ?  ?  ?  ?   ?  ?Upper Body Dressing : Set up;Supervision/safety;Sitting ?Upper Body Dressing Details (indicate cue type and reason): to don/doff hospital gown ?Lower Body Dressing: Maximal assistance;Sitting/lateral leans ?Lower Body Dressing Details (indicate cue type and reason): to don socks ?  ?  ?  ?  ?  ?  ?  ?   ? ? ? ?Vision Ability to See in Adequate Light: 0 Adequate ?Patient Visual Report: No change from baseline ?   ?   ?   ?   ? ?Pertinent Vitals/Pain Pain Assessment ?Pain Assessment: Faces ?Faces Pain Scale: Hurts whole lot ?Pain Location: BLE ?Pain Descriptors / Indicators: Aching, Constant, Discomfort, Grimacing, Moaning, Sharp ?Pain Intervention(s): Monitored during session, Premedicated before session, Repositioned, Utilized relaxation techniques  ? ? ? ?Hand Dominance   ?  ?Extremity/Trunk Assessment Upper Extremity Assessment ?Upper Extremity Assessment: Overall WFL for tasks assessed ?  ?Lower Extremity Assessment ?Lower Extremity Assessment: Generalized weakness ?  ?  ?  ?Communication Communication ?Communication: No difficulties ?  ?Cognition Arousal/Alertness: Awake/alert ?Behavior During Therapy: Anxious ?Overall Cognitive Status: No family/caregiver present to determine baseline cognitive functioning ?  ?  ?  ?  ?  ?  ?  ?  ?  ?  ?  ?  ?  ?  ?  ?  ?General Comments: oriented to self, place, and situation. Demonstrates difficulty with sustained attention/ability to remain on task, perseveration noted ?  ?  ?   ?   ?   ? ? ?Home Living Family/patient expects to be discharged to:: Private residence ?Living Arrangements: Parent (Lives with Mom & Dad who are in late 80s/early 14s) ?Available Help at Discharge: Family;Available PRN/intermittently (unable to provide physical assist) ?Type of Home: House ?Home Access: Ramped entrance ?  ?  ?Home Layout: One level ?  ?  ?Bathroom Shower/Tub: Walk-in shower ?  ?Bathroom Toilet: Standard ?  ?  ?Home Equipment: Rollator (4 wheels);Grab bars - tub/shower;Shower  seat ?  ?  ?  ? ?  ?Prior Functioning/Environment Prior Level of Function : Needs assist ?  ?  ?  ?  ?  ?  ?Mobility Comments: pt is a questionable historian. Reports that he uses a RW for functional mobility, but later on reports that he cannot recall last time he stood up ?ADLs Comments: pt is a questionable historian. Reports that he is indepedent with ADLs and shower transfers ?  ? ?  ?  ?OT Problem List: Decreased strength;Decreased activity tolerance;Impaired balance (sitting and/or standing);Pain ?  ?   ?OT Treatment/Interventions: Self-care/ADL training;Therapeutic exercise;DME and/or AE instruction;Therapeutic activities;Patient/family education;Balance training  ?  ?OT Goals(Current goals can be found in the care plan section) Acute Rehab OT Goals ?Patient Stated Goal: to be independent with self-care ?OT Goal Formulation: With patient ?Time For Goal Achievement: 10/24/21 ?Potential to Achieve Goals: Fair ?ADL Goals ?Pt Will Perform Grooming: with min guard assist;standing ?Pt Will Perform Lower Body Dressing: with min assist;with adaptive equipment;sitting/lateral leans ?Pt Will Transfer to Toilet: with min assist;ambulating;bedside commode  ?OT Frequency: Min 2X/week ?  ? ?Co-evaluation PT/OT/SLP Co-Evaluation/Treatment: Yes ?Reason for Co-Treatment: Complexity of the patient's impairments (multi-system involvement);For patient/therapist safety;To address functional/ADL transfers ?PT goals addressed during session: Mobility/safety with mobility;Balance ?OT goals addressed during session: ADL's and self-care ?  ? ?  ?AM-PAC OT "6 Clicks" Daily  Activity     ?Outcome Measure Help from another person eating meals?: None ?Help from another person taking care of personal grooming?: A Little ?Help from another person toileting, which includes using toliet, bedpan, or urinal?: A Lot ?Help from another person bathing (including washing, rinsing, drying)?: A Lot ?Help from another person to put on and taking off  regular upper body clothing?: A Little ?Help from another person to put on and taking off regular lower body clothing?: A Lot ?6 Click Score: 16 ?  ?End of Session Equipment Utilized During Treatment: Rol

## 2021-10-10 NOTE — Progress Notes (Signed)
[facility] ?[place], [place] ?10/10/21 ? ?Subjective:  ? ?Hospital day # 8 ? ?Patient known to our practice from previous admissions  ?This time patient presents for right lower extremity pain from the wound. ?Treated with broad-spectrum antibiotics including cefepime, metronidazole and vancomycin in the emergency room. ?Admitted for wound evaluation by surgical services and IV antibiotics. ? ?Update ?Patient resting quietly ?Continues to complain of lower extremity pain but states this improved with pain medications ?Tolerating meals without nausea and vomiting ?Remains on room air ?Reports continued concerns of lack of urination ? ?Objective:  ?Vital signs in last 24 hours:  ?Temp:  [97.8 ?F (36.6 ?C)-98.1 ?F (36.7 ?C)] 97.9 ?F (36.6 ?C) (04/02 0818) ?Pulse Rate:  [47-83] 65 (04/02 0818) ?Resp:  [15-20] 18 (04/02 0818) ?BP: (103-132)/(73-84) 114/84 (04/02 0818) ?SpO2:  [93 %-100 %] 100 % (04/02 0818) ? ?Weight change:  ?Filed Weights  ? 10/07/21 1405 10/08/21 0920 10/08/21 1321  ?Weight: 121.5 kg 124.6 kg 123.7 kg  ? ? ?Intake/Output: ?  ? ?Intake/Output Summary (Last 24 hours) at 10/10/2021 1033 ?Last data filed at 10/10/2021 0700 ?Gross per 24 hour  ?Intake 480 ml  ?Output 410 ml  ?Net 70 ml  ? ? ? ? ?Physical Exam: ?General: NAD  ?HEENT Anicteric, moist oral mucous membranes  ?Pulm/lungs Normal breathing effort on room air  ?CVS/Heart No rub or gallop, irregular  ?Abdomen:  Soft  ?Extremities: 1+  pitting edema bilateral lower extremities, Bilat LE NPWV  ?Neurologic: Alert oriented  ?Skin: skin changes from chronic edema bilaterally  ?Access: Left upper arm AV fistula, good thrill  ?   ? ? ?Basic Metabolic Panel: ? ?Recent Labs  ?Lab 10/04/21 ?0522 10/06/21 ?0435 10/07/21 ?4854  ?NA 136 134* 133*  ?K 5.5* 4.9 4.1  ?CL 98 97* 94*  ?CO2 22 25 26   ?GLUCOSE 90 87 98  ?BUN 71* 55* 34*  ?CREATININE 9.58* 7.89* 5.40*  ?CALCIUM 8.2* 8.7* 8.5*  ?PHOS  --  7.5*  --   ? ? ? ? ?CBC: ?Recent Labs  ?Lab  10/04/21 ?0522 10/05/21 ?6270 10/06/21 ?0435 10/07/21 ?3500 10/08/21 ?9381  ?WBC 7.1 7.2 6.3 9.6 13.4*  ?HGB 7.7* 7.9* 7.8* 8.1* 7.8*  ?HCT 23.8* 24.6* 23.5* 24.8* 23.8*  ?MCV 85.6 86.6 85.1 86.1 87.2  ?PLT 142* 130* 137* 128* 124*  ? ? ? ?  ?Lab Results  ?Component Value Date  ? HEPBSAG NON REACTIVE 10/04/2021  ? HEPBSAB Reactive (A) 10/04/2021  ? HEPBIGM Negative 11/15/2018  ? ? ? ? ?Microbiology: ? ?Recent Results (from the past 240 hour(s))  ?Resp Panel by RT-PCR (Flu A&B, Covid) Nasopharyngeal Swab     Status: None  ? Collection Time: 10/01/21  9:41 PM  ? Specimen: Nasopharyngeal Swab; Nasopharyngeal(NP) swabs in vial transport medium  ?Result Value Ref Range Status  ? SARS Coronavirus 2 by RT PCR NEGATIVE NEGATIVE Final  ?  Comment: (NOTE) ?SARS-CoV-2 target nucleic acids are NOT DETECTED. ? ?The SARS-CoV-2 RNA is generally detectable in upper respiratory ?specimens during the acute phase of infection. The lowest ?concentration of SARS-CoV-2 viral copies this assay can detect is ?138 copies/mL. A negative result does not preclude SARS-Cov-2 ?infection and should not be used as the sole basis for treatment or ?other patient management decisions. A negative result may occur with  ?improper specimen collection/handling, submission of specimen other ?than nasopharyngeal swab, presence of viral mutation(s) within the ?areas targeted by this assay, and inadequate number of viral ?copies(<138 copies/mL). A negative result  must be combined with ?clinical observations, patient history, and epidemiological ?information. The expected result is Negative. ? ?Fact Sheet for Patients:  ?EntrepreneurPulse.com.au ? ?Fact Sheet for Healthcare Providers:  ?IncredibleEmployment.be ? ?This test is no t yet approved or cleared by the [place] FDA and  ?has been authorized for detection and/or diagnosis of SARS-CoV-2 by ?FDA under an Emergency Use Authorization (EUA). This EUA will remain  ?in  effect (meaning this test can be used) for the duration of the ?COVID-19 declaration under Section 564(b)(1) of the Act, 21 ?U.S.C.section 360bbb-3(b)(1), unless the authorization is terminated  ?or revoked sooner.  ? ? ?  ? Influenza A by PCR NEGATIVE NEGATIVE Final  ? Influenza B by PCR NEGATIVE NEGATIVE Final  ?  Comment: (NOTE) ?The Xpert Xpress SARS-CoV-2/FLU/RSV plus assay is intended as an aid ?in the diagnosis of influenza from Nasopharyngeal swab specimens and ?should not be used as a sole basis for treatment. Nasal washings and ?aspirates are unacceptable for Xpert Xpress SARS-CoV-2/FLU/RSV ?testing. ? ?Fact Sheet for Patients: ?EntrepreneurPulse.com.au ? ?Fact Sheet for Healthcare Providers: ?IncredibleEmployment.be ? ?This test is not yet approved or cleared by the [place] FDA and ?has been authorized for detection and/or diagnosis of SARS-CoV-2 by ?FDA under an Emergency Use Authorization (EUA). This EUA will remain ?in effect (meaning this test can be used) for the duration of the ?COVID-19 declaration under Section 564(b)(1) of the Act, 21 U.S.C. ?section 360bbb-3(b)(1), unless the authorization is terminated or ?revoked. ? ?Performed at [facility], [place], ?[place] [address] ?  ?MRSA Next Gen by PCR, Nasal     Status: Abnormal  ? Collection Time: 10/02/21  2:55 AM  ? Specimen: Nasal Mucosa; Nasal Swab  ?Result Value Ref Range Status  ? MRSA by PCR Next Gen DETECTED (A) NOT DETECTED Final  ?  Comment: RESULT CALLED TO, READ BACK BY AND VERIFIED WITH: ?MELISSA COBB AT 0422 10/02/21.PMF ?(NOTE) ?The GeneXpert MRSA Assay (FDA approved for NASAL specimens only), ?is one component of a comprehensive MRSA colonization surveillance ?program. It is not intended to diagnose MRSA infection nor to guide ?or monitor treatment for MRSA infections. ?Test performance is not FDA approved in patients less than 2 years ?old. ?Performed at [facility], [place], ?[place] [address] ?  ? ? ?Coagulation Studies: ?No results for input(s): LABPROT, INR in the last 72 hours. ? ?Urinalysis: ?No results for input(s): COLORURINE, LABSPEC, [place], GLUCOSEU, HGBUR, BILIRUBINUR, KETONESUR, PROTEINUR, UROBILINOGEN, NITRITE, LEUKOCYTESUR in the last 72 hours. ? ?Invalid input(s): APPERANCEUR  ? ? ?Imaging: ?No results found. ? ? ?Medications:  ? ? sodium chloride 10 mL/hr at 10/08/21 2209  ? ? apixaban  5 mg Oral BID  ? atorvastatin  40 mg Oral QHS  ? bumetanide  2 mg Oral 2 times per day on Sun Tue Thu Sat  ? carvedilol  6.25 mg Oral BID  ? epoetin (EPOGEN/PROCRIT) injection  4,000 Units Intravenous Q M,W,F-HD  ? oxyCODONE  20 mg Oral Q12H  ? pantoprazole  80 mg Oral Daily  ? pregabalin  25 mg Oral QHS  ? sulfamethoxazole-trimethoprim  1 tablet Oral Q12H  ? tamsulosin  0.4 mg Oral Daily  ? ?sodium chloride, albuterol, HYDROmorphone (DILAUDID) injection, labetalol, oxyCODONE, polyethylene glycol ? ?Assessment/ Plan:  ?66 y.o. male with end-stage renal disease, hypertension, atrial fibrillation, coronary artery disease, pulmonary embolism, hyperlipidemia, polysubstance abuse, history of chronic pain syndrome ?Admission at [facility] for acute respiratory failure, COVID-19  in  January 2023 ?This time he is admitted on 10/01/2021 for ?Right leg pain [M79.604] ?Cellulitis of right lower extremity [L03.115] ?Multiple open wounds of lower leg [S81.809A] ?Multiple open wounds of left lower leg [S81.802A] ? ?Outpatient hemodialysis: [facility] dialysis.  240 minutes.  Left arm AV fistula.  15-gauge needles.  3 times per week.  MWF.  Target weight 120 kg. ? ?#End-stage renal disease with LE edema ?Increase to Bumex 2mg  on non-dialysis days yesterday.  Discussed with patient the possibility of pain medication could be causing retention.  Next dialysis scheduled for Monday.  ? ? ?#Anemia of chronic kidney disease ?Lab Results  ?Component Value Date  ?  HGB 7.8 (L) 10/08/2021  ?Mircera received outpatient ?We will continue to receive low-dose EPO with dialysis treatments ? ?#Secondary hyperparathyroidism ?Lab Results  ?Component Value Date  ? PTH 758 (H) 02

## 2021-10-11 DIAGNOSIS — I872 Venous insufficiency (chronic) (peripheral): Secondary | ICD-10-CM

## 2021-10-11 DIAGNOSIS — S81809A Unspecified open wound, unspecified lower leg, initial encounter: Secondary | ICD-10-CM | POA: Diagnosis not present

## 2021-10-11 LAB — RENAL FUNCTION PANEL
Albumin: 2.6 g/dL — ABNORMAL LOW (ref 3.5–5.0)
Anion gap: 13 (ref 5–15)
BUN: 64 mg/dL — ABNORMAL HIGH (ref 8–23)
CO2: 26 mmol/L (ref 22–32)
Calcium: 8.4 mg/dL — ABNORMAL LOW (ref 8.9–10.3)
Chloride: 95 mmol/L — ABNORMAL LOW (ref 98–111)
Creatinine, Ser: 7.85 mg/dL — ABNORMAL HIGH (ref 0.61–1.24)
GFR, Estimated: 7 mL/min — ABNORMAL LOW (ref >60)
Glucose, Bld: 102 mg/dL — ABNORMAL HIGH (ref 70–99)
Phosphorus: 6 mg/dL — ABNORMAL HIGH (ref 2.5–4.6)
Potassium: 4.5 mmol/L (ref 3.5–5.1)
Sodium: 134 mmol/L — ABNORMAL LOW (ref 135–145)

## 2021-10-11 LAB — CBC
HCT: 26.4 % — ABNORMAL LOW (ref 39.0–52.0)
Hemoglobin: 8.5 g/dL — ABNORMAL LOW (ref 13.0–17.0)
MCH: 28.4 pg (ref 26.0–34.0)
MCHC: 32.2 g/dL (ref 30.0–36.0)
MCV: 88.3 fL (ref 80.0–100.0)
Platelets: 154 10*3/uL (ref 150–400)
RBC: 2.99 MIL/uL — ABNORMAL LOW (ref 4.22–5.81)
RDW: 16.9 % — ABNORMAL HIGH (ref 11.5–15.5)
WBC: 8.8 10*3/uL (ref 4.0–10.5)
nRBC: 0 % (ref 0.0–0.2)

## 2021-10-11 MED ORDER — HYDROMORPHONE HCL 1 MG/ML IJ SOLN
1.0000 mg | INTRAMUSCULAR | Status: AC | PRN
  Administered 2021-10-11 – 2021-10-12 (×3): 1 mg via INTRAVENOUS
  Filled 2021-10-11 (×3): qty 1

## 2021-10-11 MED ORDER — OXYCODONE HCL 5 MG PO TABS
ORAL_TABLET | ORAL | Status: AC
  Filled 2021-10-11: qty 1

## 2021-10-11 MED ORDER — EPOETIN ALFA 4000 UNIT/ML IJ SOLN
INTRAMUSCULAR | Status: AC
  Administered 2021-10-11: 4000 [IU]
  Filled 2021-10-11: qty 1

## 2021-10-11 NOTE — Progress Notes (Signed)
[facility] ?[place], [place] ?10/11/21 ? ?Subjective:  ? ?Hospital day # 9 ? ?Patient known to our practice from previous admissions  ?This time patient presents for right lower extremity pain from the wound. ?Treated with broad-spectrum antibiotics including cefepime, metronidazole and vancomycin in the emergency room. ?Admitted for wound evaluation by surgical services and IV antibiotics. ? ?Update ?Patient is seen and evaluated during dialysis ?  ?HEMODIALYSIS FLOWSHEET: ? ?Blood Flow Rate (mL/min): 400 mL/min ?Arterial Pressure (mmHg): -170 mmHg ?Venous Pressure (mmHg): 180 mmHg ?Transmembrane Pressure (mmHg): 60 mmHg ?Ultrafiltration Rate (mL/min): 570 mL/min ?Dialysate Flow Rate (mL/min): 600 ml/min ?Conductivity: Machine : 13.9 ?Conductivity: Machine : 13.9 ?Dialysis Fluid Bolus: Normal Saline ?Bolus Amount (mL): 250 mL ? ?Complains of pain in legs ? ?Objective:  ?Vital signs in last 24 hours:  ?Temp:  [97.6 ?F (36.4 ?C)-98.4 ?F (36.9 ?C)] 97.7 ?F (36.5 ?C) (04/03 1430) ?Pulse Rate:  [36-94] 62 (04/03 1430) ?Resp:  [16-31] 20 (04/03 1430) ?BP: (101-129)/(67-100) 126/80 (04/03 1430) ?SpO2:  [86 %-100 %] 100 % (04/03 1430) ?Weight:  [125.2 kg-125.3 kg] 125.2 kg (04/03 1430) ? ?Weight change:  ?Filed Weights  ? 10/08/21 1321 10/11/21 1039 10/11/21 1430  ?Weight: 123.7 kg 125.3 kg 125.2 kg  ? ? ?Intake/Output: ?  ? ?Intake/Output Summary (Last 24 hours) at 10/11/2021 1445 ?Last data filed at 10/11/2021 1430 ?Gross per 24 hour  ?Intake 360 ml  ?Output 2100 ml  ?Net -1740 ml  ? ? ? ? ?Physical Exam: ?General: NAD  ?HEENT Anicteric, moist oral mucous membranes  ?Pulm/lungs Normal breathing effort on room air  ?CVS/Heart No rub or gallop, irregular  ?Abdomen:  Soft  ?Extremities: 1+  pitting edema bilateral lower extremities, Bilat LE NPWV  ?Neurologic: Alert oriented  ?Skin: skin changes from chronic edema bilaterally  ?Access: Left upper arm AV fistula, good thrill  ?   ? ? ?Basic Metabolic  Panel: ? ?Recent Labs  ?Lab 10/06/21 ?0435 10/07/21 ?8756 10/11/21 ?4332  ?NA 134* 133* 134*  ?K 4.9 4.1 4.5  ?CL 97* 94* 95*  ?CO2 25 26 26   ?GLUCOSE 87 98 102*  ?BUN 55* 34* 64*  ?CREATININE 7.89* 5.40* 7.85*  ?CALCIUM 8.7* 8.5* 8.4*  ?PHOS 7.5*  --  6.0*  ? ? ? ? ?CBC: ?Recent Labs  ?Lab 10/05/21 ?9518 10/06/21 ?0435 10/07/21 ?8416 10/08/21 ?6063 10/11/21 ?0160  ?WBC 7.2 6.3 9.6 13.4* 8.8  ?HGB 7.9* 7.8* 8.1* 7.8* 8.5*  ?HCT 24.6* 23.5* 24.8* 23.8* 26.4*  ?MCV 86.6 85.1 86.1 87.2 88.3  ?PLT 130* 137* 128* 124* 154  ? ? ? ?  ?Lab Results  ?Component Value Date  ? HEPBSAG NON REACTIVE 10/04/2021  ? HEPBSAB Reactive (A) 10/04/2021  ? HEPBIGM Negative 11/15/2018  ? ? ? ? ?Microbiology: ? ?Recent Results (from the past 240 hour(s))  ?Resp Panel by RT-PCR (Flu A&B, Covid) Nasopharyngeal Swab     Status: None  ? Collection Time: 10/01/21  9:41 PM  ? Specimen: Nasopharyngeal Swab; Nasopharyngeal(NP) swabs in vial transport medium  ?Result Value Ref Range Status  ? SARS Coronavirus 2 by RT PCR NEGATIVE NEGATIVE Final  ?  Comment: (NOTE) ?SARS-CoV-2 target nucleic acids are NOT DETECTED. ? ?The SARS-CoV-2 RNA is generally detectable in upper respiratory ?specimens during the acute phase of infection. The lowest ?concentration of SARS-CoV-2 viral copies this assay can detect is ?138 copies/mL. A negative result does not preclude SARS-Cov-2 ?infection and should not be used as the sole basis for treatment or ?other  patient management decisions. A negative result may occur with  ?improper specimen collection/handling, submission of specimen other ?than nasopharyngeal swab, presence of viral mutation(s) within the ?areas targeted by this assay, and inadequate number of viral ?copies(<138 copies/mL). A negative result must be combined with ?clinical observations, patient history, and epidemiological ?information. The expected result is Negative. ? ?Fact Sheet for Patients:  ?EntrepreneurPulse.com.au ? ?Fact  Sheet for Healthcare Providers:  ?IncredibleEmployment.be ? ?This test is no t yet approved or cleared by the [place] FDA and  ?has been authorized for detection and/or diagnosis of SARS-CoV-2 by ?FDA under an Emergency Use Authorization (EUA). This EUA will remain  ?in effect (meaning this test can be used) for the duration of the ?COVID-19 declaration under Section 564(b)(1) of the Act, 21 ?U.S.C.section 360bbb-3(b)(1), unless the authorization is terminated  ?or revoked sooner.  ? ? ?  ? Influenza A by PCR NEGATIVE NEGATIVE Final  ? Influenza B by PCR NEGATIVE NEGATIVE Final  ?  Comment: (NOTE) ?The Xpert Xpress SARS-CoV-2/FLU/RSV plus assay is intended as an aid ?in the diagnosis of influenza from Nasopharyngeal swab specimens and ?should not be used as a sole basis for treatment. Nasal washings and ?aspirates are unacceptable for Xpert Xpress SARS-CoV-2/FLU/RSV ?testing. ? ?Fact Sheet for Patients: ?EntrepreneurPulse.com.au ? ?Fact Sheet for Healthcare Providers: ?IncredibleEmployment.be ? ?This test is not yet approved or cleared by the [place] FDA and ?has been authorized for detection and/or diagnosis of SARS-CoV-2 by ?FDA under an Emergency Use Authorization (EUA). This EUA will remain ?in effect (meaning this test can be used) for the duration of the ?COVID-19 declaration under Section 564(b)(1) of the Act, 21 U.S.C. ?section 360bbb-3(b)(1), unless the authorization is terminated or ?revoked. ? ?Performed at [facility], [place], ?[place] [address] ?  ?MRSA Next Gen by PCR, Nasal     Status: Abnormal  ? Collection Time: 10/02/21  2:55 AM  ? Specimen: Nasal Mucosa; Nasal Swab  ?Result Value Ref Range Status  ? MRSA by PCR Next Gen DETECTED (A) NOT DETECTED Final  ?  Comment: RESULT CALLED TO, READ BACK BY AND VERIFIED WITH: ?MELISSA COBB AT 0422 10/02/21.PMF ?(NOTE) ?The GeneXpert MRSA Assay (FDA approved for NASAL  specimens only), ?is one component of a comprehensive MRSA colonization surveillance ?program. It is not intended to diagnose MRSA infection nor to guide ?or monitor treatment for MRSA infections. ?Test performance is not FDA approved in patients less than 2 years ?old. ?Performed at [facility], [place], ?[place] [address] ?  ? ? ?Coagulation Studies: ?No results for input(s): LABPROT, INR in the last 72 hours. ? ?Urinalysis: ?No results for input(s): COLORURINE, LABSPEC, [place], GLUCOSEU, HGBUR, BILIRUBINUR, KETONESUR, PROTEINUR, UROBILINOGEN, NITRITE, LEUKOCYTESUR in the last 72 hours. ? ?Invalid input(s): APPERANCEUR  ? ? ?Imaging: ?No results found. ? ? ?Medications:  ? ? sodium chloride 10 mL/hr at 10/08/21 2209  ? ? apixaban  5 mg Oral BID  ? atorvastatin  40 mg Oral QHS  ? bumetanide  2 mg Oral 2 times per day on Sun Tue Thu Sat  ? carvedilol  6.25 mg Oral BID  ? epoetin (EPOGEN/PROCRIT) injection  4,000 Units Intravenous Q M,W,F-HD  ? oxyCODONE      ? oxyCODONE  20 mg Oral Q12H  ? pantoprazole  80 mg Oral Daily  ? pregabalin  25 mg Oral QHS  ? sulfamethoxazole-trimethoprim  1 tablet Oral Q12H  ? tamsulosin  0.4 mg Oral Daily  ? ?  sodium chloride, albuterol, HYDROmorphone (DILAUDID) injection, labetalol, oxyCODONE, polyethylene glycol ? ?Assessment/ Plan:  ?66 y.o. male with end-stage renal disease, hypertension, atrial fibrillation, coronary artery disease, pulmonary embolism, hyperlipidemia, polysubstance abuse, history of chronic pain syndrome ?Admission at [facility] for acute respiratory failure, COVID-19  in January 2023 ?This time he is admitted on 10/01/2021 for ?Right leg pain [M79.604] ?Cellulitis of right lower extremity [L03.115] ?Multiple open wounds of lower leg [S81.809A] ?Multiple open wounds of left lower leg [S81.802A] ? ?Outpatient hemodialysis: [facility] dialysis.  240 minutes.  Left arm AV fistula.  15-gauge needles.  3 times per week.  MWF.  Target  weight 120 kg. ? ?#End-stage renal disease with LE edema ?Receiving dialysis today, UF goal 1.5L achieved. Next treatment scheduled for Wednesday.  ? ? ?#Anemia of chronic kidney disease ?Lab Results  ?Component Value

## 2021-10-11 NOTE — NC FL2 (Signed)
?[place] MEDICAID FL2 LEVEL OF CARE SCREENING TOOL  ?  ? ?IDENTIFICATION  ?Patient Name: ?[name] Birthdate: October 29, 1955 Sex: male Admission Date (Current Location): ?10/01/2021  ?[place] and [place] Number: ? [place] ?  Facility and Address:  ?[facility], [address] [place], [place] [address] ?     Provider Number: ?[id number]  ?Attending Physician Name and Address:  ?Jennye Boroughs, MD ? Relative Name and Phone Number:  ?  ?   ?Current Level of Care: ?Hospital Recommended Level of Care: ?[place] Prior Approval Number: ?  ? ?Date Approved/Denied: ?  PASRR Number: ?[id number] A ? ?Discharge Plan: ?SNF ?  ? ?Current Diagnoses: ?Patient Active Problem List  ? Diagnosis Date Noted  ? Multiple open wounds of left lower leg 10/02/2021  ? Multiple open wounds of lower leg 10/01/2021  ? Hyperkalemia 08/08/2021  ? Acute hypoxemic respiratory failure ([place]) 08/08/2021  ? Lymphedema associated with obesity 08/07/2021  ? OSA on CPAP 08/07/2021  ? Cirrhosis of liver ([place]) 06/16/2021  ? Esophageal varices ([place]) 06/16/2021  ? Hyperphosphatemia 06/15/2021  ? Persistent atrial fibrillation ([place])   ? HFrEF (heart failure with reduced ejection fraction) ([place])   ? Chest pain 01/05/2021  ? Cough   ? CAD (coronary artery disease) 01/05/2020  ? Chronic anticoagulation 01/05/2020  ? Acute bronchitis 01/05/2020  ? AF (paroxysmal atrial fibrillation) ([place]) 01/05/2020  ? History of substance abuse ([place]) 10/24/2019  ? Elevated troponin 10/24/2019  ? Nonspecific chest pain 10/24/2019  ? Chronic diastolic CHF (congestive heart failure) ([place]) 09/28/2019  ? History of MI (myocardial infarction) 09/28/2019  ? Anemia of chronic kidney failure, stage 5 ([place]) 09/28/2019  ? History of pulmonary embolism 09/28/2019  ? Chronic midline low back pain   ? Acute respiratory failure with hypoxia ([place])   ? Acute diastolic CHF (congestive heart failure) ([place])   ? Anemia of chronic disease   ?  Thrombocytopenia ([place])   ? Congestive heart failure ([place])   ? Fluid overload 06/21/2019  ? Volume overload 11/15/2018  ? ESRD (end stage renal disease) on dialysis ([place]) 11/14/2018  ? HCAP (healthcare-associated pneumonia) 08/27/2018  ? SOB (shortness of breath) 08/25/2018  ? Chest pain of uncertain etiology 30/86/5784  ? ESRD on dialysis ([place]) 11/08/2016  ? Prostate cancer screening 11/08/2016  ? Acquired cyst of kidney 11/08/2016  ? Urinary urgency 11/08/2016  ? Acute on chronic renal failure ([place]) 09/23/2016  ? Hypertensive urgency 09/21/2016  ? Dysthymia 02/26/2016  ? Chronic pain 02/26/2016  ? Noncompliance with renal dialysis 02/26/2016  ? Opiate abuse, continuous ([place]) 02/03/2016  ? Substance induced mood disorder ([place]) 02/03/2016  ? Antisocial personality disorder ([place]) 02/03/2016  ? Left-sided weakness 01/18/2016  ? HEPATITIS C 06/28/2008  ? HLD (hyperlipidemia) 06/28/2008  ? SUBSTANCE ABUSE, MULTIPLE 06/28/2008  ? Hypertension 06/27/2008  ? ? ?Orientation RESPIRATION BLADDER Height & Weight   ?  ?Self, Time, Situation, Place ? Normal, Other (Comment) (CPAP QHS: Max 20, Min 4. Does not have CPAP at home. Says sleep study was scheduled for May.) Continent Weight: 276 lb 0.3 oz (125.2 kg) ?Height:  6' (182.9 cm)  ?BEHAVIORAL SYMPTOMS/MOOD NEUROLOGICAL BOWEL NUTRITION STATUS  ? (None)   Continent Diet (Heart healthy)  ?AMBULATORY STATUS COMMUNICATION OF NEEDS Skin   ?Extensive Assist Verbally Wound Vac, Skin abrasions, Other (Comment) (Weeping. Non-pressure wounds on bilateral pretibial (wound vac).) ?  ?  ?  ?    ?     ?     ? ? ?  Personal Care Assistance Level of Assistance  ?Bathing, Feeding, Dressing Bathing Assistance: Maximum assistance ?Feeding assistance: Limited assistance ?Dressing Assistance: Maximum assistance ?   ? ?Functional Limitations Info  ?Sight, Hearing, Speech Sight Info: Adequate ?Hearing Info: Adequate ?Speech Info: Adequate  ? ? ?SPECIAL CARE FACTORS FREQUENCY  ?PT (By licensed PT), OT  (By licensed OT)   ?  ?PT Frequency: 5 x week ?OT Frequency: 5 x week ?  ?  ?  ?   ? ? ?Contractures Contractures Info: Not present  ? ? ?Additional Factors Info  ?Code Status, Allergies Code Status Info: Full code ?Allergies Info: Cyclobenzaprine, Amlodipine, Clonidine, Furosemide, Tylenol (Acetaminophen) ?  ?  ?  ?   ? ?Current Medications (10/11/2021):  This is the current hospital active medication list ?Current Facility-Administered Medications  ?Medication Dose Route Frequency Provider Last Rate Last Admin  ? 0.9 %  sodium chloride infusion   Intravenous PRN Algernon Huxley, MD 10 mL/hr at 10/08/21 2209 New Bag at 10/08/21 2209  ? albuterol (PROVENTIL) (2.5 MG/3ML) 0.083% nebulizer solution 2.5 mg  2.5 mg Nebulization Q6H PRN Algernon Huxley, MD      ? apixaban Arne [place]) tablet 5 mg  5 mg Oral BID Dallie Piles, RPH   5 mg at 10/11/21 6222  ? atorvastatin (LIPITOR) tablet 40 mg  40 mg Oral QHS Algernon Huxley, MD   40 mg at 10/10/21 2108  ? bumetanide (BUMEX) tablet 2 mg  2 mg Oral 2 times per day on Sun Tue Thu Sat Colon Flattery, NP   2 mg at 10/10/21 1643  ? carvedilol (COREG) tablet 6.25 mg  6.25 mg Oral BID Algernon Huxley, MD   6.25 mg at 10/10/21 2108  ? epoetin alfa (EPOGEN) injection 4,000 Units  4,000 Units Intravenous Q M,W,F-HD Algernon Huxley, MD   4,000 Units at 10/06/21 1125  ? HYDROmorphone (DILAUDID) injection 1 mg  1 mg Intravenous Q4H PRN Jennye Boroughs, MD   1 mg at 10/11/21 1614  ? labetalol (NORMODYNE) injection 5 mg  5 mg Intravenous Q3H PRN Algernon Huxley, MD      ? oxyCODONE (Oxy IR/ROXICODONE) 5 MG immediate release tablet           ? oxyCODONE (Oxy IR/ROXICODONE) immediate release tablet 5 mg  5 mg Oral Q6H PRN Algernon Huxley, MD   5 mg at 10/11/21 1306  ? oxyCODONE (OXYCONTIN) 12 hr tablet 20 mg  20 mg Oral Q12H Algernon Huxley, MD   20 mg at 10/11/21 0913  ? pantoprazole (PROTONIX) EC tablet 80 mg  80 mg Oral Daily Algernon Huxley, MD   80 mg at 10/11/21 0912  ? polyethylene glycol (MIRALAX / GLYCOLAX)  packet 17 g  17 g Oral Daily PRN Algernon Huxley, MD   17 g at 10/07/21 1743  ? pregabalin (LYRICA) capsule 25 mg  25 mg Oral QHS Algernon Huxley, MD   25 mg at 10/10/21 2108  ? sulfamethoxazole-trimethoprim (BACTRIM) 400-80 MG per tablet 1 tablet  1 tablet Oral Q12H Jennye Boroughs, MD   1 tablet at 10/11/21 0914  ? tamsulosin (FLOMAX) capsule 0.4 mg  0.4 mg Oral Daily Algernon Huxley, MD   0.4 mg at 10/11/21 9798  ? ? ? ?Discharge Medications: ?Please see discharge summary for a list of discharge medications. ? ?Relevant Imaging Results: ? ?Relevant Lab Results: ? ? ?Additional Information ?SS#: [id number]. [facility] [place] MWF 11:00am. ? ?Candie Chroman,  LCSW ? ? ? ? ?

## 2021-10-11 NOTE — Progress Notes (Signed)
Mobility Specialist - Progress Note ? ? 10/11/21 1700  ?Mobility  ?Activity Transferred from bed to chair  ?Level of Assistance +2 (takes two people)  ?Assistive Device Front wheel walker  ?Distance Ambulated (ft) 4 ft  ?Activity Response Tolerated well  ?$Mobility charge 1 Mobility  ? ? ? ?Pt transferred bed-chair with minA +2 with assist from NT. Pt voiced pain with all mobility, RN administered pain medication prior to session. Assist on LE and extra time to achieve sitting EOB. 2 attempts needed to successfully stand from elevated bed height. VC for weight-shifting and UE use to offset pain. Pt left in chair with alarm set, needs in reach.  ? ? ?Kathee Delton ?Mobility Specialist ?10/11/21, 5:07 PM ? ? ? ? ?

## 2021-10-11 NOTE — Progress Notes (Signed)
? ? ? ?Progress Note  ? ? ?[name]  GLO:[id number] DOB: 09-03-1955  DOA: 10/01/2021 ?PCP: [facility] [place]  ? ? ? ? ?Brief Narrative:  ? ? ?Medical records reviewed and are as summarized below: ? ? ?[name] is a 66 year old male with end-stage renal disease on hemodialysis, hypertension, hyperlipidemia, persistent atrial fibrillation on anticoagulation with Eliquis, GERD, BPH, chronic pain syndrome, history of noncompliance with hemodialysis, who presents emergency department for chief concerns of right lower extremity leg pain. ? ?Vitals in the emergency department showed temperature of 98.3, respiration rate of 20, heart rate of 116 and improved to 55, blood pressure 156/92, SPO2 of 93% on room air. ? ?Serum sodium 136, potassium 4.5, chloride 96, bicarb 27, BUN 45, serum creatinine of 6.79, nonfasting blood glucose 89, GFR of 8, WBC 8, hemoglobin 8, platelets of 161. ? ?Lactic acid is 1.1. ? ?ED treatment: Cefepime 2 g IV one-time dose, metronidazole 500 mg IV one-time dose, vancomycin per pharmacy.  Oxycodone 5-325 p.o. one-time dose, morphine 4 mg IV every 3 hours as needed for moderate pain ordered by EDP. ? ? ? ? ? ?Assessment/Plan:  ? ?Principal Problem: ?  Multiple open wounds of lower leg ?Active Problems: ?  HLD (hyperlipidemia) ?  Hypertension ?  ESRD on dialysis [facility]) ?  Anemia of chronic disease ?  CAD (coronary artery disease) ?  Chronic anticoagulation ?  AF (paroxysmal atrial fibrillation) ([place]) ?  Multiple open wounds of left lower leg ? ? ? ? ?Body mass index is 37.43 kg/m?.  (Obesity) ? ?Multiple open wounds of the lower legs with cellulitis: S/p I&D of bilateral lower extremity wounds on 10/07/2021.  Continue Bactrim and local wound care.   ? ?Paroxysmal atrial fibrillation, CAD: Continue Eliquis. ? ?ESRD on HD on MWF schedule, anemia of chronic kidney disease, secondary hyperparathyroidism: Continue Epogen.  Follow-up with nephrologist for hemodialysis ? ?Acute  urinary retention: S/p in and out urethral catheterization on 10/10/2021. ? ?Generalized weakness: PT recommends discharge to SNF.  Follow-up with social worker to assist with placement. ? ?Other comorbidities include hypertension, hyperlipidemia ? ? ? ? ?Diet Order   ? ?       ?  Diet Heart Room service appropriate? Yes; Fluid consistency: Thin  Diet effective now       ?  ? ?  ?  ? ?  ? ? ? ? ? ? ?Consultants: ?Vascular surgeon ? ?Procedures: ?I&D of extensive bilateral leg wounds on 10/07/2021 ? ? ? ?Medications:  ? ? apixaban  5 mg Oral BID  ? atorvastatin  40 mg Oral QHS  ? bumetanide  2 mg Oral 2 times per day on Sun Tue Thu Sat  ? carvedilol  6.25 mg Oral BID  ? epoetin (EPOGEN/PROCRIT) injection  4,000 Units Intravenous Q M,W,F-HD  ? oxyCODONE      ? oxyCODONE  20 mg Oral Q12H  ? pantoprazole  80 mg Oral Daily  ? pregabalin  25 mg Oral QHS  ? sulfamethoxazole-trimethoprim  1 tablet Oral Q12H  ? tamsulosin  0.4 mg Oral Daily  ? ?Continuous Infusions: ? sodium chloride 10 mL/hr at 10/08/21 2209  ? ? ? ?Anti-infectives (From admission, onward)  ? ? Start     Dose/Rate Route Frequency Ordered Stop  ? 10/10/21 2200  sulfamethoxazole-trimethoprim (BACTRIM) 400-80 MG per tablet 1 tablet       ? 1 tablet Oral Every 12 hours 10/08/21 1534    ? 10/10/21  1000  sulfamethoxazole-trimethoprim (BACTRIM DS) 800-160 MG per tablet 1 tablet       ? 1 tablet Oral  Once 10/08/21 1534 10/10/21 0825  ? 10/09/21 2000  metroNIDAZOLE (FLAGYL) tablet 500 mg       ? 500 mg Oral  Once 10/09/21 1313 10/09/21 2028  ? 10/02/21 2200  ceFEPIme (MAXIPIME) 1 g in sodium chloride 0.9 % 100 mL IVPB       ? 1 g ?200 mL/hr over 30 Minutes Intravenous Every 24 hours 10/01/21 2351 10/08/21 2243  ? 10/02/21 0800  metroNIDAZOLE (FLAGYL) IVPB 500 mg  Status:  Discontinued       ? 500 mg ?100 mL/hr over 60 Minutes Intravenous Every 12 hours 10/01/21 2341 10/09/21 1313  ? 10/02/21 0000  vancomycin (VANCOCIN) IVPB 1000 mg/200 mL premix       ? 1,000 mg ?200  mL/hr over 60 Minutes Intravenous Every M-W-F (Hemodialysis) 10/01/21 2344 10/08/21 1308  ? 10/01/21 2230  vancomycin (VANCOCIN) IVPB 1000 mg/200 mL premix       ? 1,000 mg ?200 mL/hr over 60 Minutes Intravenous  Once 10/01/21 2121 10/02/21 0039  ? 10/01/21 2115  ceFEPIme (MAXIPIME) 2 g in sodium chloride 0.9 % 100 mL IVPB       ? 2 g ?200 mL/hr over 30 Minutes Intravenous  Once 10/01/21 2105 10/01/21 2214  ? 10/01/21 2115  metroNIDAZOLE (FLAGYL) IVPB 500 mg       ? 500 mg ?100 mL/hr over 60 Minutes Intravenous  Once 10/01/21 2105 10/01/21 2318  ? 10/01/21 2115  vancomycin (VANCOCIN) IVPB 1000 mg/200 mL premix       ? 1,000 mg ?200 mL/hr over 60 Minutes Intravenous  Once 10/01/21 2105 10/01/21 2323  ? ?  ? ? ? ? ? ? ? ? ? ?Family Communication/Anticipated D/C date and plan/Code Status  ? ?DVT prophylaxis:  ?apixaban (ELIQUIS) tablet 5 mg  ? ?  Code Status: Full Code ? ?Family Communication: Plan discussed with Belenda Cruise, brother, over the phone in patient's room ?Disposition Plan: Plan to discharge to SNF in 2 to 3 days ? ? ?Status is: Inpatient ?Remains inpatient appropriate because: Awaiting placement to SNF ? ? ? ? ? ?Subjective:  ? ?He complains of pain in both legs. ? ?Objective:  ? ? ?Vitals:  ? 10/11/21 1400 10/11/21 1415 10/11/21 1430 10/11/21 1454  ?BP: 129/79 (!) 114/93 126/80   ?Pulse:  67 62   ?Resp: 19 (!) 29 20 (!) 34  ?Temp:   97.7 ?F (36.5 ?C)   ?TempSrc:   Oral   ?SpO2:  97% 100%   ?Weight:   125.2 kg   ?Height:      ? ?No data found. ? ? ?Intake/Output Summary (Last 24 hours) at 10/11/2021 1715 ?Last data filed at 10/11/2021 1430 ?Gross per 24 hour  ?Intake 360 ml  ?Output 2100 ml  ?Net -1740 ml  ? ?Filed Weights  ? 10/08/21 1321 10/11/21 1039 10/11/21 1430  ?Weight: 123.7 kg 125.3 kg 125.2 kg  ? ? ?Exam: ? ?GEN: NAD ?SKIN: Wounds on bilateral legs connected to wound VAC ?EYES: EOMI ?ENT: MMM ?CV: RRR ?PULM: CTA B ?ABD: soft, obese, NT, +BS ?CNS: AAO x 3, non focal ?EXT: Bilateral leg edema, mild  tenderness on the legs ? ? ? ? ? ?  ? ? ?Data Reviewed:  ? ?I have personally reviewed following labs and imaging studies: ? ?Labs: ?Labs show the following:  ? ?Basic Metabolic Panel: ?Recent Labs  ?  Lab 10/06/21 ?0435 10/07/21 ?9480 10/11/21 ?1655  ?NA 134* 133* 134*  ?K 4.9 4.1 4.5  ?CL 97* 94* 95*  ?CO2 25 26 26   ?GLUCOSE 87 98 102*  ?BUN 55* 34* 64*  ?CREATININE 7.89* 5.40* 7.85*  ?CALCIUM 8.7* 8.5* 8.4*  ?PHOS 7.5*  --  6.0*  ? ?GFR ?Estimated Creatinine Clearance: 13.2 mL/min (A) (by C-G formula based on SCr of 7.85 mg/dL (H)). ?Liver Function Tests: ?Recent Labs  ?Lab 10/06/21 ?0435 10/11/21 ?3748  ?ALBUMIN 2.7* 2.6*  ? ?No results for input(s): LIPASE, AMYLASE in the last 168 hours. ?No results for input(s): AMMONIA in the last 168 hours. ?Coagulation profile ?No results for input(s): INR, PROTIME in the last 168 hours. ? ?CBC: ?Recent Labs  ?Lab 10/05/21 ?2707 10/06/21 ?0435 10/07/21 ?8675 10/08/21 ?4492 10/11/21 ?0100  ?WBC 7.2 6.3 9.6 13.4* 8.8  ?HGB 7.9* 7.8* 8.1* 7.8* 8.5*  ?HCT 24.6* 23.5* 24.8* 23.8* 26.4*  ?MCV 86.6 85.1 86.1 87.2 88.3  ?PLT 130* 137* 128* 124* 154  ? ?Cardiac Enzymes: ?No results for input(s): CKTOTAL, CKMB, CKMBINDEX, TROPONINI in the last 168 hours. ?BNP (last 3 results) ?No results for input(s): PROBNP in the last 8760 hours. ?CBG: ?No results for input(s): GLUCAP in the last 168 hours. ?D-Dimer: ?No results for input(s): DDIMER in the last 72 hours. ?Hgb A1c: ?No results for input(s): HGBA1C in the last 72 hours. ?Lipid Profile: ?No results for input(s): CHOL, HDL, LDLCALC, TRIG, CHOLHDL, LDLDIRECT in the last 72 hours. ?Thyroid function studies: ?No results for input(s): TSH, T4TOTAL, T3FREE, THYROIDAB in the last 72 hours. ? ?Invalid input(s): FREET3 ?Anemia work up: ?No results for input(s): VITAMINB12, FOLATE, FERRITIN, TIBC, IRON, RETICCTPCT in the last 72 hours. ?Sepsis Labs: ?Recent Labs  ?Lab 10/06/21 ?0435 10/07/21 ?7121 10/08/21 ?9758 10/11/21 ?8325  ?WBC 6.3 9.6 13.4*  8.8  ? ? ?Microbiology ?Recent Results (from the past 240 hour(s))  ?Resp Panel by RT-PCR (Flu A&B, Covid) Nasopharyngeal Swab     Status: None  ? Collection Time: 10/01/21  9:41 PM  ? Specimen: Nasopharyngeal Sw

## 2021-10-11 NOTE — Progress Notes (Signed)
[facility] ? ?Daily Progress Note ? ? ?Subjective  -  ? ?No events.  VAC with good seal ? ?Objective ?Vitals:  ? 10/11/21 1415 10/11/21 1430 10/11/21 1454 10/11/21 1729  ?BP: (!) 114/93 126/80  136/76  ?Pulse: 67 62  77  ?Resp: (!) 29 20 (!) 34 20  ?Temp:  97.7 ?F (36.5 ?C)  98.1 ?F (36.7 ?C)  ?TempSrc:  Oral    ?SpO2: 97% 100%  91%  ?Weight:  125.2 kg    ?Height:      ? ? ?Intake/Output Summary (Last 24 hours) at 10/11/2021 1808 ?Last data filed at 10/11/2021 1430 ?Gross per 24 hour  ?Intake 360 ml  ?Output 2100 ml  ?Net -1740 ml  ? ? ?PULM  CTAB ?CV  RRR ?VASC  VAC with good seals, moderate leg edema bilaterally ? ?Laboratory ?CBC ?   ?Component Value Date/Time  ? WBC 8.8 10/11/2021 0539  ? HGB 8.5 (L) 10/11/2021 0539  ? HGB 14.3 11/08/2014 1559  ? HCT 26.4 (L) 10/11/2021 0539  ? HCT 42.2 11/08/2014 1559  ? PLT 154 10/11/2021 0539  ? PLT 231 11/08/2014 1559  ? ? ?BMET ?   ?Component Value Date/Time  ? NA 134 (L) 10/11/2021 0539  ? NA 138 11/08/2014 1559  ? K 4.5 10/11/2021 0539  ? K 3.5 11/08/2014 1559  ? CL 95 (L) 10/11/2021 0539  ? CL 103 11/08/2014 1559  ? CO2 26 10/11/2021 0539  ? CO2 27 11/08/2014 1559  ? GLUCOSE 102 (H) 10/11/2021 0539  ? GLUCOSE 143 (H) 11/08/2014 1559  ? BUN 64 (H) 10/11/2021 0539  ? BUN 16 11/08/2014 1559  ? CREATININE 7.85 (H) 10/11/2021 0539  ? CREATININE 1.60 (H) 11/08/2014 1559  ? CALCIUM 8.4 (L) 10/11/2021 0539  ? CALCIUM 9.2 11/08/2014 1559  ? GFRNONAA 7 (L) 10/11/2021 0539  ? GFRNONAA 47 (L) 11/08/2014 1559  ? GFRAA 10 (L) 01/16/2020 1009  ? GFRAA 55 (L) 11/08/2014 1559  ? ? ?Assessment/Planning: ?POD #4 s/p debridement of venous stasis wounds. ? ?Would recommend removing VAC and having the wound care nurse place [facility] boots bilaterally tomorrow ?Patient needs to elevate his legs. ?Compliance is going to be an issue ? ? ?[name] ? ?10/11/2021, 6:08 PM ? ? ? ?  ?

## 2021-10-11 NOTE — TOC Progression Note (Signed)
Transition of Care (TOC) - Progression Note  ? ? ?Patient Details  ?Name: LORANZO DESHA ?MRN: [id number] ?Date of Birth: Jun 06, 1956 ? ?Transition of Care (TOC) CM/SW Contact  ?Candie Chroman, LCSW ?Phone Number: ?10/11/2021, 4:52 PM ? ?Clinical Narrative:   Patient agreeable to SNF recommendation. Gave CMS scores for facilities within 25 miles of his zip code. Will follow up once bed offers available. ? ?Expected Discharge Plan: Home/Self Care ?Barriers to Discharge: Continued Medical Work up ? ?Expected Discharge Plan and Services ?Expected Discharge Plan: Home/Self Care ?  ?  ?  ?Living arrangements for the past 2 months: [place] ?                ?  ?  ?  ?  ?  ?  ?  ?  ?  ?  ? ? ?Social Determinants of Health (SDOH) Interventions ?  ? ?Readmission Risk Interventions ? ?  10/03/2021  ?  1:48 PM  ?Readmission Risk Prevention Plan  ?Transportation Screening Complete  ?Medication Review Press photographer) Complete  ?PCP or Specialist appointment within 3-5 days of discharge Complete  ?[place] or Home Care Consult Complete  ?Palliative Care Screening Not Applicable  ?[place] Not Applicable  ? ? ?

## 2021-10-11 NOTE — Care Management Important Message (Signed)
Important Message ? ?Patient Details  ?Name: [name] ?MRN: [id number] ?Date of Birth: 03-10-1956 ? ? ?Medicare Important Message Given:  Yes ? ?Patient out of room for dialysis upon time of visit.  Copy of Medicare IM left in room for reference.  ? ?Dannette Barbara ?10/11/2021, 11:48 AM ?

## 2021-10-11 NOTE — Progress Notes (Signed)
Hemodialysis Post Treatment Note ?11 October 2021 ?Left Arm AVF ?1.5 liter Fluid Removal ? ?Note: ?Patient completes scheduled 3.5 hour without incident. AVF functions well, targeted UF met. Patient consistently complains of pain and comfort despite medication regimen. Patient returns to room, report given.  ?

## 2021-10-11 NOTE — Progress Notes (Signed)
PT Cancellation Note ? ?Patient Details ?Name: [name] ?MRN: [id number] ?DOB: 03-10-1956 ? ? ?Cancelled Treatment:    Reason Eval/Treat Not Completed: Patient at procedure or test/unavailable ? ?Lieutenant Diego PT, DPT ?11:20 AM,10/11/21 ? ?

## 2021-10-11 NOTE — Plan of Care (Signed)
?  Problem: Nutrition: ?Goal: Adequate nutrition will be maintained ?Outcome: Not Progressing ?  ?Problem: Elimination: ?Goal: Will not experience complications related to bowel motility ?Outcome: Not Progressing ?  ?Problem: Nutrition: ?Goal: Adequate nutrition will be maintained ?Outcome: Not Progressing ?  ?Problem: Safety: ?Goal: Ability to remain free from injury will improve ?Outcome: Not Progressing ?  ?Problem: Skin Integrity: ?Goal: Risk for impaired skin integrity will decrease ?Outcome: Not Progressing ?  ?

## 2021-10-12 DIAGNOSIS — S81809A Unspecified open wound, unspecified lower leg, initial encounter: Secondary | ICD-10-CM | POA: Diagnosis not present

## 2021-10-12 DIAGNOSIS — I480 Paroxysmal atrial fibrillation: Secondary | ICD-10-CM | POA: Diagnosis not present

## 2021-10-12 MED ORDER — HYDROMORPHONE HCL 1 MG/ML IJ SOLN
1.0000 mg | Freq: Once | INTRAMUSCULAR | Status: AC
  Administered 2021-10-12: 1 mg via INTRAVENOUS
  Filled 2021-10-12: qty 1

## 2021-10-12 NOTE — Consult Note (Signed)
WOC consulted for bilateral LE wounds, however it is noted this patient has been actively followed this admission and had multiple debridements per vascular.  Reviewed Dr. Bunnie Domino notes from 10/11/21 with recommendations for VAC removal and Unna's boots.  I will verify with Dr. Lucky Cowboy and enter orders appropriately.  ?Patient will not elevate his legs due to pain, may not allow Unna's boot placement for same, history of self removal.  ? ?Patient would benefit from lymphedema clinic referral. However he also has a history of non compliance with CDT (complete decongestive therapy) so the work of the referral to the lymphedema clinic may not be valuable if the patient is not willing to accept long term need for compression therapy, manual massage, and skin care needs.  ? ?[name] [facility], CNS, CWON-AP ?[phone]  ?

## 2021-10-12 NOTE — Progress Notes (Signed)
Occupational Therapy Treatment ?Patient Details ?Name: [name] ?MRN: [id number] ?DOB: 15-Dec-1955 ?Today's Date: 10/12/2021 ? ? ?History of present illness Pt is a 66 y.o. male who presents to ED for evaluation of acutely worsening right leg pain swelling drainage; reports having warm to touch and increased drainage from wound area. Currently admitted s/p irrigation and debridement of extensive LE wounds bilaterally and bilateral VAC dressing placements. PmHx: HLD, HTN, ESRD on Dialysis, Anemia, CAD, Paroxysmal A-Fib. ?  ?OT comments ? Pt seen for OT treatment on this date. Upon arrival to room, pt awake and seated upright in recliner. Pt agreeable to OT tx. Pt is making good progress towards goals, and this date, required only MIN A for step pivot transfers to/from [facility] and SUPERVISION/SET-UP for seated grooming tasks. Pt continues to present with b/l LE pain, decreased strength, and decreased activity tolerance and would benefit from additional skilled OT services to maximize return to PLOF and minimize risk of future falls, injury, caregiver burden, and readmission. Will continue to follow POC. Discharge recommendation remains appropriate.    ? ?Recommendations for follow up therapy are one component of a multi-disciplinary discharge planning process, led by the attending physician.  Recommendations may be updated based on patient status, additional functional criteria and insurance authorization. ?   ?Follow Up Recommendations ? Skilled nursing-short term rehab (<3 hours/day)  ?  ?Assistance Recommended at Discharge Intermittent Supervision/Assistance  ?Patient can return home with the following ? A lot of help with walking and/or transfers;A lot of help with bathing/dressing/bathroom;Assistance with cooking/housework;Help with stairs or ramp for entrance ?  ?Equipment Recommendations ? Other (comment) (defer to next venue of care)  ?  ?   ?Precautions / Restrictions Precautions ?Precautions:  Fall ?Restrictions ?Weight Bearing Restrictions: No  ? ? ?  ? ?Mobility Bed Mobility ?  ?  ?  ?  ?  ?  ?  ?General bed mobility comments: not assessed, pt in recliner pre/post session ?  ? ?Transfers ?Overall transfer level: Needs assistance ?Equipment used: Rolling walker (2 wheels) ?Transfers: Sit to/from Stand ?Sit to Stand: Min assist ?  ?  ?Step pivot transfers: Min assist ?  ?  ?  ?  ?  ?Balance Overall balance assessment: Needs assistance ?Sitting-balance support: No upper extremity supported, Feet supported ?Sitting balance-Leahy Scale: Fair ?  ?  ?Standing balance support: Bilateral upper extremity supported, During functional activity, Reliant on assistive device for balance ?Standing balance-Leahy Scale: Poor ?Standing balance comment: Requres MIN A for step pivot transfer recliner>BSC ?  ?  ?  ?  ?  ?  ?  ?  ?  ?  ?  ?   ? ?ADL either performed or assessed with clinical judgement  ? ?ADL Overall ADL's : Needs assistance/impaired ?  ?  ?Grooming: Wash/dry hands;Wash/dry face;Supervision/safety;Set up;Sitting ?  ?  ?  ?  ?  ?  ?  ?  ?  ?Toilet Transfer: Minimal assistance;BSC/3in1;Rolling walker (2 wheels) ?Toilet Transfer Details (indicate cue type and reason): Requires verbal cues for body positioning ?  ?  ?  ?  ?  ?  ?  ? ? ? ?Cognition Arousal/Alertness: Awake/alert ?Behavior During Therapy: Anxious ?Overall Cognitive Status: No family/caregiver present to determine baseline cognitive functioning ?  ?  ?  ?  ?  ?  ?  ?  ?  ?  ?  ?  ?  ?  ?  ?  ?General Comments: oriented to self, place, and situation. Demonstrates difficulty with  sustained attention/ability to remain on task, perseveration noted ?  ?  ?   ?   ?   ?General Comments Pt had just received dressing change to B lower legs  ? ? ?Pertinent Vitals/ Pain       Pain Assessment ?Pain Assessment: Faces ?Faces Pain Scale: Hurts whole lot ?Pain Location: BLE ?Pain Descriptors / Indicators: Aching, Constant, Discomfort, Grimacing, Moaning,  Sharp ?Pain Intervention(s): Monitored during session, Repositioned, Premedicated before session ? ?   ?   ? ?Frequency ? Min 2X/week  ? ? ? ? ?  ?Progress Toward Goals ? ?OT Goals(current goals can now be found in the care plan section) ? Progress towards OT goals: Progressing toward goals ? ?Acute Rehab OT Goals ?Patient Stated Goal: to be independent with self-care ?OT Goal Formulation: With patient ?Time For Goal Achievement: 10/24/21 ?Potential to Achieve Goals: Fair  ?Plan Discharge plan remains appropriate;Frequency remains appropriate   ? ?   ?AM-PAC OT "6 Clicks" Daily Activity     ?Outcome Measure ? ? Help from another person eating meals?: None ?Help from another person taking care of personal grooming?: A Little ?Help from another person toileting, which includes using toliet, bedpan, or urinal?: A Lot ?Help from another person bathing (including washing, rinsing, drying)?: A Lot ?Help from another person to put on and taking off regular upper body clothing?: A Little ?Help from another person to put on and taking off regular lower body clothing?: A Lot ?6 Click Score: 16 ? ?  ?End of Session Equipment Utilized During Treatment: Rolling walker (2 wheels) ? ?OT Visit Diagnosis: Unsteadiness on feet (R26.81);Muscle weakness (generalized) (M62.81);Pain ?Pain - part of body: Leg ?  ?Activity Tolerance Patient tolerated treatment well ?  ?Patient Left in chair;with call bell/phone within reach;with chair alarm set ?  ?Nurse Communication Mobility status ?  ? ?   ? ?Time: 9826-4158 ?OT Time Calculation (min): 18 min ? ?Charges: OT General Charges ?$OT Visit: 1 Visit ?OT Treatments ?$Self Care/Home Management : 8-22 mins ? ?Fredirick Maudlin, OTR/L ?[place] ? ?

## 2021-10-12 NOTE — Progress Notes (Signed)
? ? ? ?Progress Note  ? ? ?[name]  YQM:[id number] DOB: [birth year]  DOA: 10/01/2021 ?PCP: [facility] [place]  ? ? ? ? ?Brief Narrative:  ? ? ?Medical records reviewed and are as summarized below: ? ? ?[name] is a 66 year old male with end-stage renal disease on hemodialysis, hypertension, hyperlipidemia, persistent atrial fibrillation on anticoagulation with Eliquis, GERD, BPH, chronic pain syndrome, history of noncompliance with hemodialysis, who presents emergency department for chief concerns of right lower extremity leg pain. ? ?Vitals in the emergency department showed temperature of 98.3, respiration rate of 20, heart rate of 116 and improved to 55, blood pressure 156/92, SPO2 of 93% on room air. ? ?Serum sodium 136, potassium 4.5, chloride 96, bicarb 27, BUN 45, serum creatinine of 6.79, nonfasting blood glucose 89, GFR of 8, WBC 8, hemoglobin 8, platelets of 161. ? ?Lactic acid is 1.1. ? ?ED treatment: Cefepime 2 g IV one-time dose, metronidazole 500 mg IV one-time dose, vancomycin per pharmacy.  Oxycodone 5-325 p.o. one-time dose, morphine 4 mg IV every 3 hours as needed for moderate pain ordered by EDP. ? ? ? ? ? ?Assessment/Plan:  ? ?Principal Problem: ?  Multiple open wounds of lower leg ?Active Problems: ?  HLD (hyperlipidemia) ?  Hypertension ?  ESRD on dialysis [facility]) ?  Anemia of chronic disease ?  CAD (coronary artery disease) ?  Chronic anticoagulation ?  AF (paroxysmal atrial fibrillation) ([place]) ?  Multiple open wounds of left lower leg ? ? ? ? ?Body mass index is 37.43 kg/m?.  (Obesity) ? ?Multiple open wounds of the lower legs with cellulitis: S/p I&D of bilateral lower extremity wounds on 10/07/2021.  Continue Bactrim through 10/16/2021.  Wound VAC has been removed and Unna boots have been applied to bilateral legs.  Continue analgesics. ? ?Paroxysmal atrial fibrillation, CAD: Continue Eliquis. ? ?ESRD on HD on MWF schedule, anemia of chronic kidney disease, secondary  hyperparathyroidism: Continue Epogen.  Follow-up with nephrologist for hemodialysis ? ?Acute urinary retention: S/p in and out urethral catheterization on 10/10/2021. ? ?Generalized weakness: PT recommends discharge to SNF.  Follow-up with social worker to assist with placement. ? ?Other comorbidities include hypertension, hyperlipidemia ? ? ? ? ?Diet Order   ? ?       ?  Diet Heart Room service appropriate? Yes; Fluid consistency: Thin  Diet effective now       ?  ? ?  ?  ? ?  ? ? ? ? ? ? ?Consultants: ?Vascular surgeon ? ?Procedures: ?I&D of extensive bilateral leg wounds on 10/07/2021 ? ? ? ?Medications:  ? ? apixaban  5 mg Oral BID  ? atorvastatin  40 mg Oral QHS  ? bumetanide  2 mg Oral 2 times per day on Sun Tue Thu Sat  ? carvedilol  6.25 mg Oral BID  ? epoetin (EPOGEN/PROCRIT) injection  4,000 Units Intravenous Q M,W,F-HD  ? oxyCODONE  20 mg Oral Q12H  ? pantoprazole  80 mg Oral Daily  ? pregabalin  25 mg Oral QHS  ? sulfamethoxazole-trimethoprim  1 tablet Oral Q12H  ? tamsulosin  0.4 mg Oral Daily  ? ?Continuous Infusions: ? sodium chloride 10 mL/hr at 10/08/21 2209  ? ? ? ?Anti-infectives (From admission, onward)  ? ? Start     Dose/Rate Route Frequency Ordered Stop  ? 10/10/21 2200  sulfamethoxazole-trimethoprim (BACTRIM) 400-80 MG per tablet 1 tablet       ? 1 tablet Oral Every 12 hours  10/08/21 1534    ? 10/10/21 1000  sulfamethoxazole-trimethoprim (BACTRIM DS) 800-160 MG per tablet 1 tablet       ? 1 tablet Oral  Once 10/08/21 1534 10/10/21 0825  ? 10/09/21 2000  metroNIDAZOLE (FLAGYL) tablet 500 mg       ? 500 mg Oral  Once 10/09/21 1313 10/09/21 2028  ? 10/02/21 2200  ceFEPIme (MAXIPIME) 1 g in sodium chloride 0.9 % 100 mL IVPB       ? 1 g ?200 mL/hr over 30 Minutes Intravenous Every 24 hours 10/01/21 2351 10/08/21 2243  ? 10/02/21 0800  metroNIDAZOLE (FLAGYL) IVPB 500 mg  Status:  Discontinued       ? 500 mg ?100 mL/hr over 60 Minutes Intravenous Every 12 hours 10/01/21 2341 10/09/21 1313  ? 10/02/21  0000  vancomycin (VANCOCIN) IVPB 1000 mg/200 mL premix       ? 1,000 mg ?200 mL/hr over 60 Minutes Intravenous Every M-W-F (Hemodialysis) 10/01/21 2344 10/08/21 1308  ? 10/01/21 2230  vancomycin (VANCOCIN) IVPB 1000 mg/200 mL premix       ? 1,000 mg ?200 mL/hr over 60 Minutes Intravenous  Once 10/01/21 2121 10/02/21 0039  ? 10/01/21 2115  ceFEPIme (MAXIPIME) 2 g in sodium chloride 0.9 % 100 mL IVPB       ? 2 g ?200 mL/hr over 30 Minutes Intravenous  Once 10/01/21 2105 10/01/21 2214  ? 10/01/21 2115  metroNIDAZOLE (FLAGYL) IVPB 500 mg       ? 500 mg ?100 mL/hr over 60 Minutes Intravenous  Once 10/01/21 2105 10/01/21 2318  ? 10/01/21 2115  vancomycin (VANCOCIN) IVPB 1000 mg/200 mL premix       ? 1,000 mg ?200 mL/hr over 60 Minutes Intravenous  Once 10/01/21 2105 10/01/21 2323  ? ?  ? ? ? ? ? ? ? ? ? ?Family Communication/Anticipated D/C date and plan/Code Status  ? ?DVT prophylaxis:  ?apixaban (ELIQUIS) tablet 5 mg  ? ?  Code Status: Full Code ? ?Family Communication: None ?Disposition Plan: Plan to discharge to SNF in 1 to 2 days ? ? ?Status is: Inpatient ?Remains inpatient appropriate because: Awaiting placement to SNF ? ? ? ? ? ?Subjective:  ? ?Interval history today.  He complains of pain in bilateral legs.  Manuela Schwartz, RN and Melody, wound care nurse were at the bedside ? ?Objective:  ? ? ?Vitals:  ? 10/11/21 1729 10/11/21 2019 10/12/21 0430 10/12/21 9518  ?BP: 136/76 (!) 109/57 110/72 125/77  ?Pulse: 77 80 75 84  ?Resp: 20 20 20    ?Temp: 98.1 ?F (36.7 ?C) 98.3 ?F (36.8 ?C) 98.2 ?F (36.8 ?C) 97.8 ?F (36.6 ?C)  ?TempSrc:      ?SpO2: 91% 95% 95% 96%  ?Weight:      ?Height:      ? ?No data found. ? ? ?Intake/Output Summary (Last 24 hours) at 10/12/2021 1324 ?Last data filed at 10/12/2021 1026 ?Gross per 24 hour  ?Intake 100 ml  ?Output 2400 ml  ?Net -2300 ml  ? ?Filed Weights  ? 10/08/21 1321 10/11/21 1039 10/11/21 1430  ?Weight: 123.7 kg 125.3 kg 125.2 kg  ? ? ?Exam: ? ?GEN: NAD ?SKIN: Warm and dry.  Wounds on bilateral  legs look much better.  Wound VAC has been removed. ?EYES: No pallor or icterus ?ENT: MMM ?CV: RRR ?PULM: CTA B ?ABD: soft, obese, NT, +BS ?CNS: AAO x 3, non focal ?EXT: Bilateral leg edema and tenderness ? ? ? ?  ? ? ? ? ? ? ? ?  Data Reviewed:  ? ?I have personally reviewed following labs and imaging studies: ? ?Labs: ?Labs show the following:  ? ?Basic Metabolic Panel: ?Recent Labs  ?Lab 10/06/21 ?0435 10/07/21 ?7616 10/11/21 ?0737  ?NA 134* 133* 134*  ?K 4.9 4.1 4.5  ?CL 97* 94* 95*  ?CO2 25 26 26   ?GLUCOSE 87 98 102*  ?BUN 55* 34* 64*  ?CREATININE 7.89* 5.40* 7.85*  ?CALCIUM 8.7* 8.5* 8.4*  ?PHOS 7.5*  --  6.0*  ? ?GFR ?Estimated Creatinine Clearance: 13.2 mL/min (A) (by C-G formula based on SCr of 7.85 mg/dL (H)). ?Liver Function Tests: ?Recent Labs  ?Lab 10/06/21 ?0435 10/11/21 ?1062  ?ALBUMIN 2.7* 2.6*  ? ?No results for input(s): LIPASE, AMYLASE in the last 168 hours. ?No results for input(s): AMMONIA in the last 168 hours. ?Coagulation profile ?No results for input(s): INR, PROTIME in the last 168 hours. ? ?CBC: ?Recent Labs  ?Lab 10/06/21 ?0435 10/07/21 ?6948 10/08/21 ?5462 10/11/21 ?7035  ?WBC 6.3 9.6 13.4* 8.8  ?HGB 7.8* 8.1* 7.8* 8.5*  ?HCT 23.5* 24.8* 23.8* 26.4*  ?MCV 85.1 86.1 87.2 88.3  ?PLT 137* 128* 124* 154  ? ?Cardiac Enzymes: ?No results for input(s): CKTOTAL, CKMB, CKMBINDEX, TROPONINI in the last 168 hours. ?BNP (last 3 results) ?No results for input(s): PROBNP in the last 8760 hours. ?CBG: ?No results for input(s): GLUCAP in the last 168 hours. ?D-Dimer: ?No results for input(s): DDIMER in the last 72 hours. ?Hgb A1c: ?No results for input(s): HGBA1C in the last 72 hours. ?Lipid Profile: ?No results for input(s): CHOL, HDL, LDLCALC, TRIG, CHOLHDL, LDLDIRECT in the last 72 hours. ?Thyroid function studies: ?No results for input(s): TSH, T4TOTAL, T3FREE, THYROIDAB in the last 72 hours. ? ?Invalid input(s): FREET3 ?Anemia work up: ?No results for input(s): VITAMINB12, FOLATE, FERRITIN, TIBC,  IRON, RETICCTPCT in the last 72 hours. ?Sepsis Labs: ?Recent Labs  ?Lab 10/06/21 ?0435 10/07/21 ?0093 10/08/21 ?8182 10/11/21 ?9937  ?WBC 6.3 9.6 13.4* 8.8  ? ? ?Microbiology ?No results found for this or any p

## 2021-10-12 NOTE — Progress Notes (Addendum)
[facility] ?[place], [place] ?10/12/21 ? ?Subjective:  ? ?Hospital day # 10 ? ?Patient known to our practice from previous admissions  ?This time patient presents for right lower extremity pain from the wound. ?Treated with broad-spectrum antibiotics including cefepime, metronidazole and vancomycin in the emergency room. ?Admitted for wound evaluation by surgical services and IV antibiotics. ? ?Update ?Patient seen sitting in chair ?Continues to complain of leg pain ?Lower extremity edema remains ? ?Objective:  ?Vital signs in last 24 hours:  ?Temp:  [97.7 ?F (36.5 ?C)-98.3 ?F (36.8 ?C)] 97.8 ?F (36.6 ?C) (04/04 1856) ?Pulse Rate:  [62-84] 84 (04/04 0812) ?Resp:  [19-34] 20 (04/04 0430) ?BP: (109-136)/(57-93) 125/77 (04/04 3149) ?SpO2:  [91 %-100 %] 96 % (04/04 0812) ?Weight:  [125.2 kg] 125.2 kg (04/03 1430) ? ?Weight change:  ?Filed Weights  ? 10/08/21 1321 10/11/21 1039 10/11/21 1430  ?Weight: 123.7 kg 125.3 kg 125.2 kg  ? ? ?Intake/Output: ?  ? ?Intake/Output Summary (Last 24 hours) at 10/12/2021 1319 ?Last data filed at 10/12/2021 1026 ?Gross per 24 hour  ?Intake 100 ml  ?Output 2400 ml  ?Net -2300 ml  ? ? ? ? ?Physical Exam: ?General: NAD  ?HEENT Anicteric, moist oral mucous membranes  ?Pulm/lungs Normal breathing effort on room air  ?CVS/Heart No rub or gallop, irregular  ?Abdomen:  Soft  ?Extremities: 2+  pitting edema bilateral lower extremities, Bilat LE NPWV  ?Neurologic: Alert oriented  ?Skin: skin changes from chronic edema bilaterally  ?Access: Left upper arm AV fistula, good thrill  ?   ? ? ?Basic Metabolic Panel: ? ?Recent Labs  ?Lab 10/06/21 ?0435 10/07/21 ?7026 10/11/21 ?3785  ?NA 134* 133* 134*  ?K 4.9 4.1 4.5  ?CL 97* 94* 95*  ?CO2 25 26 26   ?GLUCOSE 87 98 102*  ?BUN 55* 34* 64*  ?CREATININE 7.89* 5.40* 7.85*  ?CALCIUM 8.7* 8.5* 8.4*  ?PHOS 7.5*  --  6.0*  ? ? ? ? ?CBC: ?Recent Labs  ?Lab 10/06/21 ?0435 10/07/21 ?8850 10/08/21 ?2774 10/11/21 ?1287  ?WBC 6.3 9.6 13.4* 8.8  ?HGB  7.8* 8.1* 7.8* 8.5*  ?HCT 23.5* 24.8* 23.8* 26.4*  ?MCV 85.1 86.1 87.2 88.3  ?PLT 137* 128* 124* 154  ? ? ? ?  ?Lab Results  ?Component Value Date  ? HEPBSAG NON REACTIVE 10/04/2021  ? HEPBSAB Reactive (A) 10/04/2021  ? HEPBIGM Negative 11/15/2018  ? ? ? ? ?Microbiology: ? ?No results found for this or any previous visit (from the past 240 hour(s)). ? ? ?Coagulation Studies: ?No results for input(s): LABPROT, INR in the last 72 hours. ? ?Urinalysis: ?No results for input(s): COLORURINE, LABSPEC, [place], GLUCOSEU, HGBUR, BILIRUBINUR, KETONESUR, PROTEINUR, UROBILINOGEN, NITRITE, LEUKOCYTESUR in the last 72 hours. ? ?Invalid input(s): APPERANCEUR  ? ? ?Imaging: ?No results found. ? ? ?Medications:  ? ? sodium chloride 10 mL/hr at 10/08/21 2209  ? ? apixaban  5 mg Oral BID  ? atorvastatin  40 mg Oral QHS  ? bumetanide  2 mg Oral 2 times per day on Sun Tue Thu Sat  ? carvedilol  6.25 mg Oral BID  ? epoetin (EPOGEN/PROCRIT) injection  4,000 Units Intravenous Q M,W,F-HD  ? oxyCODONE  20 mg Oral Q12H  ? pantoprazole  80 mg Oral Daily  ? pregabalin  25 mg Oral QHS  ? sulfamethoxazole-trimethoprim  1 tablet Oral Q12H  ? tamsulosin  0.4 mg Oral Daily  ? ?sodium chloride, albuterol, labetalol, oxyCODONE, polyethylene glycol ? ?Assessment/ Plan:  ?66 y.o. male with  end-stage renal disease, hypertension, atrial fibrillation, coronary artery disease, pulmonary embolism, hyperlipidemia, polysubstance abuse, history of chronic pain syndrome ?Admission at [facility] for acute respiratory failure, COVID-19  in January 2023 ?This time he is admitted on 10/01/2021 for ?Right leg pain [M79.604] ?Cellulitis of right lower extremity [L03.115] ?Multiple open wounds of lower leg [S81.809A] ?Multiple open wounds of left lower leg [S81.802A] ? ?Outpatient hemodialysis: [facility] dialysis.  240 minutes.  Left arm AV fistula.  15-gauge needles.  3 times per week.  MWF.  Target weight 120 kg. ? ?#End-stage renal disease with LE  edema ?Received dialysis yesterday, next treatment scheduled for Wednesday. Offered a sequential treatment for fluid removal today and patient refused, states he's too tired.  ? ? ?#Anemia of chronic kidney disease ?Lab Results  ?Component Value Date  ? HGB 8.5 (L) 10/11/2021  ?Mircera received outpatient ?Hgb below target ?Continue low-dose EPO with dialysis treatments ? ?#Secondary hyperparathyroidism ?Lab Results  ?Component Value Date  ? PTH 758 (H) 08/15/2018  ? CALCIUM 8.4 (L) 10/11/2021  ? PHOS 6.0 (H) 10/11/2021  ?Will continue to monitor bone minerals during this admission ? ?#Lower extremity wounds ?Bactrim single strength, metronidazole ?Antibiotics as per internal medicine team. ? Appreciate vascular performing irrigation debridement of bilateral leg wounds yesterday.  Report shows all nonviable tissue removed, more extensive on right than left.  Wound VAC placed on bilateral lower extremities. Pain control ? ? ? ? LOS: 10 ?Elizabeth ?4/4/20231:19 PM ? ?[facility] ?[place], [place] ?[phone] ? ?

## 2021-10-12 NOTE — NC FL2 (Signed)
?[place] MEDICAID FL2 LEVEL OF CARE SCREENING TOOL  ?  ? ?IDENTIFICATION  ?Patient Name: ?[name] Birthdate: 21-Feb-1956 Sex: male Admission Date (Current Location): ?10/01/2021  ?[place] and [place] Number: ? [place] ?  Facility and Address:  ?[facility], [address] [place], [place] [address] ?     Provider Number: ?[id number]  ?Attending Physician Name and Address:  ?Jennye Boroughs, MD ? Relative Name and Phone Number:  ?  ?   ?Current Level of Care: ?Hospital Recommended Level of Care: ?[place] Prior Approval Number: ?  ? ?Date Approved/Denied: ?  PASRR Number: ?[id number] ? ?Discharge Plan: ?SNF ?  ? ?Current Diagnoses: ?Patient Active Problem List  ? Diagnosis Date Noted  ? Multiple open wounds of left lower leg 10/02/2021  ? Multiple open wounds of lower leg 10/01/2021  ? Hyperkalemia 08/08/2021  ? Acute hypoxemic respiratory failure ([place]) 08/08/2021  ? Lymphedema associated with obesity 08/07/2021  ? OSA on CPAP 08/07/2021  ? Cirrhosis of liver ([place]) 06/16/2021  ? Esophageal varices ([place]) 06/16/2021  ? Hyperphosphatemia 06/15/2021  ? Persistent atrial fibrillation ([place])   ? HFrEF (heart failure with reduced ejection fraction) ([place])   ? Chest pain 01/05/2021  ? Cough   ? CAD (coronary artery disease) 01/05/2020  ? Chronic anticoagulation 01/05/2020  ? Acute bronchitis 01/05/2020  ? AF (paroxysmal atrial fibrillation) ([place]) 01/05/2020  ? History of substance abuse ([place]) 10/24/2019  ? Elevated troponin 10/24/2019  ? Nonspecific chest pain 10/24/2019  ? Chronic diastolic CHF (congestive heart failure) ([place]) 09/28/2019  ? History of MI (myocardial infarction) 09/28/2019  ? Anemia of chronic kidney failure, stage 5 ([place]) 09/28/2019  ? History of pulmonary embolism 09/28/2019  ? Chronic midline low back pain   ? Acute respiratory failure with hypoxia ([place])   ? Acute diastolic CHF (congestive heart failure) ([place])   ? Anemia of chronic disease   ?  Thrombocytopenia ([place])   ? Congestive heart failure ([place])   ? Fluid overload 06/21/2019  ? Volume overload 11/15/2018  ? ESRD (end stage renal disease) on dialysis ([place]) 11/14/2018  ? HCAP (healthcare-associated pneumonia) 08/27/2018  ? SOB (shortness of breath) 08/25/2018  ? Chest pain of uncertain etiology 05/07/2535  ? ESRD on dialysis ([place]) 11/08/2016  ? Prostate cancer screening 11/08/2016  ? Acquired cyst of kidney 11/08/2016  ? Urinary urgency 11/08/2016  ? Acute on chronic renal failure ([place]) 09/23/2016  ? Hypertensive urgency 09/21/2016  ? Dysthymia 02/26/2016  ? Chronic pain 02/26/2016  ? Noncompliance with renal dialysis 02/26/2016  ? Opiate abuse, continuous ([place]) 02/03/2016  ? Substance induced mood disorder ([place]) 02/03/2016  ? Antisocial personality disorder ([place]) 02/03/2016  ? Left-sided weakness 01/18/2016  ? HEPATITIS C 06/28/2008  ? HLD (hyperlipidemia) 06/28/2008  ? SUBSTANCE ABUSE, MULTIPLE 06/28/2008  ? Hypertension 06/27/2008  ? ? ?[place] RESPIRATION BLADDER Height & Weight   ?  ?Self, Time, Situation, Place ? Normal, Other (Comment) (CPAP QHS: Max 20, Min 4. Does not have CPAP at home. Says sleep study was scheduled for May.) Continent Weight: 276 lb 0.3 oz (125.2 kg) ?Height:  6' (182.9 cm)  ?BEHAVIORAL SYMPTOMS/MOOD NEUROLOGICAL BOWEL NUTRITION STATUS  ? (None)   Continent Diet (Heart healthy)  ?AMBULATORY STATUS COMMUNICATION OF NEEDS Skin   ?Extensive Assist Verbally Skin abrasions, Other (Comment) (Weeping. Non-pressure wounds on bilateral pretibial (aquacel, unna boots).) ?  ?  ?  ?    ?     ?     ? ? ?  Personal Care Assistance Level of Assistance  ?Bathing, Feeding, Dressing Bathing Assistance: Maximum assistance ?Feeding assistance: Limited assistance ?Dressing Assistance: Maximum assistance ?   ? ?Functional Limitations Info  ?Sight, Hearing, Speech Sight Info: Adequate ?Hearing Info: Adequate ?Speech Info: Adequate  ? ? ?SPECIAL CARE FACTORS FREQUENCY  ?PT (By licensed PT), OT  (By licensed OT)   ?  ?PT Frequency: 5 x week ?OT Frequency: 5 x week ?  ?  ?  ?   ? ? ?Contractures Contractures Info: Not present  ? ? ?Additional Factors Info  ?Code Status, Allergies Code Status Info: Full code ?Allergies Info: Cyclobenzaprine, Amlodipine, Clonidine, Furosemide, Tylenol (Acetaminophen) ?  ?  ?  ?   ? ?Current Medications (10/12/2021):  This is the current hospital active medication list ?Current Facility-Administered Medications  ?Medication Dose Route Frequency Provider Last Rate Last Admin  ? 0.9 %  sodium chloride infusion   Intravenous PRN Algernon Huxley, MD 10 mL/hr at 10/08/21 2209 New Bag at 10/08/21 2209  ? albuterol (PROVENTIL) (2.5 MG/3ML) 0.083% nebulizer solution 2.5 mg  2.5 mg Nebulization Q6H PRN Algernon Huxley, MD      ? apixaban Arne [place]) tablet 5 mg  5 mg Oral BID Dallie Piles, RPH   5 mg at 10/12/21 2549  ? atorvastatin (LIPITOR) tablet 40 mg  40 mg Oral QHS Algernon Huxley, MD   40 mg at 10/11/21 2051  ? bumetanide (BUMEX) tablet 2 mg  2 mg Oral 2 times per day on Sun Tue Thu Sat Colon Flattery, NP   2 mg at 10/12/21 8264  ? carvedilol (COREG) tablet 6.25 mg  6.25 mg Oral BID Algernon Huxley, MD   6.25 mg at 10/12/21 1583  ? epoetin alfa (EPOGEN) injection 4,000 Units  4,000 Units Intravenous Q M,W,F-HD Algernon Huxley, MD   4,000 Units at 10/06/21 1125  ? labetalol (NORMODYNE) injection 5 mg  5 mg Intravenous Q3H PRN Algernon Huxley, MD      ? oxyCODONE (Oxy IR/ROXICODONE) immediate release tablet 5 mg  5 mg Oral Q6H PRN Algernon Huxley, MD   5 mg at 10/12/21 0553  ? oxyCODONE (OXYCONTIN) 12 hr tablet 20 mg  20 mg Oral Q12H Algernon Huxley, MD   20 mg at 10/12/21 0809  ? pantoprazole (PROTONIX) EC tablet 80 mg  80 mg Oral Daily Algernon Huxley, MD   80 mg at 10/12/21 0810  ? polyethylene glycol (MIRALAX / GLYCOLAX) packet 17 g  17 g Oral Daily PRN Algernon Huxley, MD   17 g at 10/07/21 1743  ? pregabalin (LYRICA) capsule 25 mg  25 mg Oral QHS Algernon Huxley, MD   25 mg at 10/11/21 2051  ?  sulfamethoxazole-trimethoprim (BACTRIM) 400-80 MG per tablet 1 tablet  1 tablet Oral Q12H Jennye Boroughs, MD   1 tablet at 10/12/21 0854  ? tamsulosin (FLOMAX) capsule 0.4 mg  0.4 mg Oral Daily Algernon Huxley, MD   0.4 mg at 10/12/21 0810  ? ? ? ?Discharge Medications: ?Please see discharge summary for a list of discharge medications. ? ?Relevant Imaging Results: ? ?Relevant Lab Results: ? ? ?Additional Information ?SS#: [id number]. HD [place] MWF 11:00am. ? ?Candie Chroman, LCSW ? ? ? ? ?

## 2021-10-12 NOTE — Progress Notes (Signed)
Mobility Specialist - Progress Note ? ? 10/12/21 1520  ?Mobility  ?Activity Refused mobility  ? ? ? ?Pt declined mobility; reports working with PT earlier this date. Will attempt another date/time.  ? ? ?Kathee Delton ?Mobility Specialist ?10/12/21, 3:21 PM ? ? ? ? ?

## 2021-10-12 NOTE — Consult Note (Signed)
WOC Nurse Consult Note: ?Reason for Consult: remove NPWT dressing from bilateral LEs and apply Unna's boots bilaterally. Confirmed orders with Dr. Lucky Cowboy ?Patient long standing history, more then 3 years. Chronic lymphedema with open wounds. Was followed by [facility] for  couple of years,wounds were improved  ?Wound type: venous stasis in the presence of lymphedema  ?Pressure Injury POA: NA ?Measurement:  ?LLE: extensive lateral calf wound: 13cm x 9cm x 0.2cm; 100% red/bleeding; post removal of NPWT sponge ?LLE: medial smaller areas each 3cm x 3cm x 0.2cm; 100% red/bleeding ?RLE: extensive wound covering the medial, posterior, and lateral calf; unable to measure due to pain and lack of assistance ? ?Wound bed:all the wound essentially are clean, s/p surgical debridements over the last week ?Drainage (amount, consistency, odor) moderate, serosanguinous in the [facility] cansiters ?Periwound:maceration from NPWT foam over intact skin; and leaking seal on NPWT dressing; especially on the RLE ?Dressing procedure/placement/frequency: ?Removed NPWT dressing from the LLE; patient is screaming out and jerking in the bed. Bedside nurse has administered Diluadid 1mg  IV. I have stopped and requested additional pain meds for RLE, due to extensive nature of the wound on the RLE.  Dr. Mal Misty at the bedside and ordered additional Diluadid 1mg  IV.  Patient is screaming and jerking and continually requesting more pain meds, reporting he take oral narcotics up to 4-6 times per day at home. ?I was able to get both NPWT dressings off and Dr. Mal Misty has taken images for the patient's EMR.  ? ?Applied sheets of silver hydrofiber (2) to the LLE with ABD pad, then secured with Unna's boot application. Patient is jerking and loud about pain. Once Unna's boot applied, pain has improved for patient.  ?Applied sheets of silver hydrofiber (3) to the RLE with ABD pads (2), then secured with Unna's boot application.  Patient's pain seems to be improving, he  however continues to shout and begins to pray loudly about pain. ? ?PT arrived to assist patient from the bed to the chair. Patient sleeps in chair, feels less pain per patient with legs dangling. Stressed importance of elevation and use of compression life long. Suggested patient return to the [facility] for long term management.  ? ?[place] nurse team will follow along while inpatient for topical care and compression therapy while inpatient. Dr. Lucky Cowboy and CM aware and working on plan for wound care and compression at discharge. ? ?Adella Manolis [facility], CNS, CWON-AP ?[phone]  ? ? ? ?  ?

## 2021-10-12 NOTE — Progress Notes (Signed)
Mobility Specialist - Progress Note ? ? 10/12/21 1600  ?Mobility  ?Activity Dangled on edge of bed  ?Range of Motion/Exercises Right leg;Left leg  ?Assistive Device None  ?Distance Ambulated (ft) 0 ft  ?Activity Response Tolerated well  ?$Mobility charge 1 Mobility  ? ? ? ?Pt sitting in recliner upon arrival, utilizing RA. Pt performed LE therex with supervision. Does voice pain in BLE. Pt left in chair with needs in reach.  ? ? ?Kathee Delton ?Mobility Specialist ?10/12/21, 4:59 PM ? ? ? ? ?

## 2021-10-12 NOTE — Progress Notes (Signed)
Physical Therapy Treatment ?Patient Details ?Name: [name] ?MRN: [id number] ?DOB: 1955-07-22 ?Today's Date: 10/12/2021 ? ? ?History of Present Illness Pt is a 66 y.o. male who presents to ED for evaluation of acutely worsening right leg pain swelling drainage; reports having warm to touch and increased drainage from wound area. Currently admitted s/p irrigation and debridement of extensive LE wounds bilaterally and bilateral VAC dressing placements. PmHx: HLD, HTN, ESRD on Dialysis, Anemia, CAD, Paroxysmal A-Fib. ? ?  ?PT Comments  ? ? Upon arrival pt was sitting EOB with nursing after receiving dressing change to B lower legs. Nursing stated pt demonstrated ability to stand from recliner to RW, ambulate to bed, and transfer sit to supine without any physical assist. Bed height raised, pt stood with ModA to RW, pt able to advance ~4 steps to bed with CGA for safety. Pt positioned to comfort with LE's elevated. Pt educated on continuing ankle pumps to promote circulation. Will continue to progress as able, pt may be self limiting at times and seeking pain meds frequently. ?  ?Recommendations for follow up therapy are one component of a multi-disciplinary discharge planning process, led by the attending physician.  Recommendations may be updated based on patient status, additional functional criteria and insurance authorization. ? ?Follow Up Recommendations ? Skilled nursing-short term rehab (<3 hours/day) ?  ?  ?Assistance Recommended at Discharge Intermittent Supervision/Assistance  ?Patient can return home with the following A little help with walking and/or transfers;A little help with bathing/dressing/bathroom;Assistance with cooking/housework;Assist for transportation;Help with stairs or ramp for entrance ?  ?Equipment Recommendations ? Rolling walker (2 wheels)  ?  ?Recommendations for Other Services   ? ? ?  ?Precautions / Restrictions Precautions ?Precautions: Fall ?Restrictions ?Weight Bearing  Restrictions: No  ?  ? ?Mobility ? Bed Mobility ?  ?  ?  ?  ?  ?  ?  ?General bed mobility comments:  (Pt received at EOB) ?  ? ?Transfers ?Overall transfer level: Needs assistance ?Equipment used: Rolling walker (2 wheels) ?Transfers: Sit to/from Stand (from raised bed) ?Sit to Stand: Mod assist ?  ?  ?  ?  ?  ?  ?  ? ?Ambulation/Gait ?Ambulation/Gait assistance: Min guard ?Gait Distance (Feet): 4 Feet ?Assistive device: Rolling walker (2 wheels) ?Gait Pattern/deviations: Step-to pattern, Shuffle ?  ?  ?  ?  ? ? ?Stairs ?  ?  ?  ?  ?  ? ? ?Wheelchair Mobility ?  ? ?Modified Rankin (Stroke Patients Only) ?  ? ? ?  ?Balance   ?  ?  ?  ?  ?  ?  ?  ?  ?  ?  ?  ?  ?  ?  ?  ?  ?  ?  ?  ? ?  ?Cognition Arousal/Alertness: Awake/alert ?Behavior During Therapy: Anxious ?Overall Cognitive Status: No family/caregiver present to determine baseline cognitive functioning ?  ?  ?  ?  ?  ?  ?  ?  ?  ?  ?  ?  ?  ?  ?  ?  ?General Comments: oriented to self, place, and situation. Demonstrates difficulty with sustained attention/ability to remain on task, perseveration noted ?  ?  ? ?  ?Exercises   ? ?  ?General Comments General comments (skin integrity, edema, etc.): Pt had just received dressing change to B lower legs ?  ?  ? ?Pertinent Vitals/Pain Pain Assessment ?Pain Assessment: 0-10 ?Pain Score: 10-Worst pain ever ?Pain Intervention(s): Premedicated before session (  Pt received 4 Dilaudid and continues to call out in pain when someone is present)  ? ? ?Home Living   ?  ?  ?  ?  ?  ?  ?  ?  ?  ?   ?  ?Prior Function    ?  ?  ?   ? ?PT Goals (current goals can now be found in the care plan section) Acute Rehab PT Goals ?Patient Stated Goal: to decrease leg pain and get stronger to go home ? ?  ?Frequency ? ? ? Min 2X/week ? ? ? ?  ?PT Plan    ? ? ?Co-evaluation   ?  ?  ?  ?  ? ?  ?AM-PAC PT "6 Clicks" Mobility   ?Outcome Measure ? Help needed turning from your back to your side while in a flat bed without using bedrails?: A  Little ?Help needed moving from lying on your back to sitting on the side of a flat bed without using bedrails?: A Little ?Help needed moving to and from a bed to a chair (including a wheelchair)?: A Little ?Help needed standing up from a chair using your arms (e.g., wheelchair or bedside chair)?: A Little ?Help needed to walk in hospital room?: A Lot ?Help needed climbing 3-5 steps with a railing? : Total ?6 Click Score: 15 ? ?  ?End of Session Equipment Utilized During Treatment: Gait belt ?Activity Tolerance: Patient limited by pain ?Patient left: in chair;with call bell/phone within reach;with chair alarm set ?Nurse Communication: Mobility status ?PT Visit Diagnosis: Unsteadiness on feet (R26.81);Pain;Muscle weakness (generalized) (M62.81) ?Pain - part of body: Leg;Ankle and joints of foot ?  ? ? ?Time: 0340-3524 ?PT Time Calculation (min) (ACUTE ONLY): 15 min ? ?Charges:  $Therapeutic Activity: 8-22 mins          ?          ?Mikel Cella, PTA ? ? ? ?[name] ?10/12/2021, 12:43 PM ? ?

## 2021-10-12 NOTE — TOC Progression Note (Addendum)
Transition of Care (TOC) - Progression Note  ? ? ?Patient Details  ?Name: [name] ?MRN: [id number] ?Date of Birth: 07-19-1955 ? ?Transition of Care (TOC) CM/SW Contact  ?Candie Chroman, LCSW ?Phone Number: ?10/12/2021, 8:24 AM ? ?Clinical Narrative:  No bed offers this morning.  ? ?10:07 am: Vascular recommending to remove wound vac and place unna boots. Will need to update SNF referral and send back out if so. ? ?2:11 pm: Patient has accepted bed offer from [facility]. Left message for admissions coordinator to notify. ? ?2:39 pm: [facility] will order CPAP. ? ?Expected Discharge Plan: Home/Self Care ?Barriers to Discharge: Continued Medical Work up ? ?Expected Discharge Plan and Services ?Expected Discharge Plan: Home/Self Care ?  ?  ?  ?Living arrangements for the past 2 months: [place] ?                ?  ?  ?  ?  ?  ?  ?  ?  ?  ?  ? ? ?Social Determinants of Health (SDOH) Interventions ?  ? ?Readmission Risk Interventions ? ?  10/03/2021  ?  1:48 PM  ?Readmission Risk Prevention Plan  ?Transportation Screening Complete  ?Medication Review Press photographer) Complete  ?PCP or Specialist appointment within 3-5 days of discharge Complete  ?[place] or Home Care Consult Complete  ?Palliative Care Screening Not Applicable  ?[place] Not Applicable  ? ? ?

## 2021-10-13 DIAGNOSIS — S81809A Unspecified open wound, unspecified lower leg, initial encounter: Secondary | ICD-10-CM | POA: Diagnosis not present

## 2021-10-13 MED ORDER — EPOETIN ALFA 4000 UNIT/ML IJ SOLN
INTRAMUSCULAR | Status: AC
  Administered 2021-10-13: 4000 [IU]
  Filled 2021-10-13: qty 1

## 2021-10-13 MED ORDER — OXYCODONE HCL 5 MG PO TABS
10.0000 mg | ORAL_TABLET | Freq: Four times a day (QID) | ORAL | Status: DC | PRN
  Administered 2021-10-13 – 2021-10-14 (×4): 10 mg via ORAL
  Filled 2021-10-13 (×5): qty 2

## 2021-10-13 MED ORDER — ACETAMINOPHEN 325 MG PO TABS
ORAL_TABLET | ORAL | Status: AC
  Filled 2021-10-13: qty 2

## 2021-10-13 MED ORDER — ACETAMINOPHEN 325 MG PO TABS
650.0000 mg | ORAL_TABLET | Freq: Once | ORAL | Status: AC
Start: 2021-10-13 — End: 2021-10-13
  Administered 2021-10-13: 650 mg via ORAL

## 2021-10-13 NOTE — Progress Notes (Signed)
[facility] ?[place], [place] ?10/13/21 ? ?Subjective:  ? ?Hospital day # 11 ? ?Patient known to our practice from previous admissions  ?This time patient presents for right lower extremity pain from the wound. ?Treated with broad-spectrum antibiotics including cefepime, metronidazole and vancomycin in the emergency room. ?Admitted for wound evaluation by surgical services and IV antibiotics. ? ?Update ?Patient seen sitting up in chair ?Continues to complain of lower extremity pain ?Lower extremity edema remains ?Patient states primary team to increase pain medication ?Tolerating meals without nausea and vomiting ?Remains on room air ? ?Objective:  ?Vital signs in last 24 hours:  ?Temp:  [97.7 ?F (36.5 ?C)-99 ?F (37.2 ?C)] 97.7 ?F (36.5 ?C) (04/05 5053) ?Pulse Rate:  [63-91] 79 (04/05 0846) ?Resp:  [16-20] 16 (04/05 0846) ?BP: (117-133)/(62-77) 122/62 (04/05 0846) ?SpO2:  [96 %-99 %] 97 % (04/05 0846) ? ?Weight change:  ?Filed Weights  ? 10/08/21 1321 10/11/21 1039 10/11/21 1430  ?Weight: 123.7 kg 125.3 kg 125.2 kg  ? ? ?Intake/Output: ?  ? ?Intake/Output Summary (Last 24 hours) at 10/13/2021 1101 ?Last data filed at 10/13/2021 1014 ?Gross per 24 hour  ?Intake 560 ml  ?Output --  ?Net 560 ml  ? ? ? ? ?Physical Exam: ?General: NAD  ?HEENT Anicteric, moist oral mucous membranes  ?Pulm/lungs Normal breathing effort on room air  ?CVS/Heart No rub or gallop, irregular  ?Abdomen:  Soft  ?Extremities: 2+  pitting edema bilateral lower extremities, Bilat LE gauze dressings  ?Neurologic: Alert oriented  ?Skin: skin changes from chronic edema bilaterally  ?Access: Left upper arm AV fistula, good thrill  ?   ? ? ?Basic Metabolic Panel: ? ?Recent Labs  ?Lab 10/07/21 ?9767 10/11/21 ?3419  ?NA 133* 134*  ?K 4.1 4.5  ?CL 94* 95*  ?CO2 26 26  ?GLUCOSE 98 102*  ?BUN 34* 64*  ?CREATININE 5.40* 7.85*  ?CALCIUM 8.5* 8.4*  ?PHOS  --  6.0*  ? ? ? ? ?CBC: ?Recent Labs  ?Lab 10/07/21 ?3790 10/08/21 ?2409 10/11/21 ?7353  ?WBC  9.6 13.4* 8.8  ?HGB 8.1* 7.8* 8.5*  ?HCT 24.8* 23.8* 26.4*  ?MCV 86.1 87.2 88.3  ?PLT 128* 124* 154  ? ? ? ?  ?Lab Results  ?Component Value Date  ? HEPBSAG NON REACTIVE 10/04/2021  ? HEPBSAB Reactive (A) 10/04/2021  ? HEPBIGM Negative 11/15/2018  ? ? ? ? ?Microbiology: ? ?No results found for this or any previous visit (from the past 240 hour(s)). ? ? ?Coagulation Studies: ?No results for input(s): LABPROT, INR in the last 72 hours. ? ?Urinalysis: ?No results for input(s): COLORURINE, LABSPEC, [place], GLUCOSEU, HGBUR, BILIRUBINUR, KETONESUR, PROTEINUR, UROBILINOGEN, NITRITE, LEUKOCYTESUR in the last 72 hours. ? ?Invalid input(s): APPERANCEUR  ? ? ?Imaging: ?No results found. ? ? ?Medications:  ? ? sodium chloride 10 mL/hr at 10/08/21 2209  ? ? apixaban  5 mg Oral BID  ? atorvastatin  40 mg Oral QHS  ? bumetanide  2 mg Oral 2 times per day on Sun Tue Thu Sat  ? carvedilol  6.25 mg Oral BID  ? epoetin (EPOGEN/PROCRIT) injection  4,000 Units Intravenous Q M,W,F-HD  ? oxyCODONE  20 mg Oral Q12H  ? pantoprazole  80 mg Oral Daily  ? pregabalin  25 mg Oral QHS  ? sulfamethoxazole-trimethoprim  1 tablet Oral Q12H  ? tamsulosin  0.4 mg Oral Daily  ? ?sodium chloride, albuterol, labetalol, oxyCODONE, polyethylene glycol ? ?Assessment/ Plan:  ?66 y.o. male with end-stage renal disease, hypertension, atrial fibrillation,  coronary artery disease, pulmonary embolism, hyperlipidemia, polysubstance abuse, history of chronic pain syndrome ?Admission at [facility] for acute respiratory failure, COVID-19  in January 2023 ?This time he is admitted on 10/01/2021 for ?Right leg pain [M79.604] ?Cellulitis of right lower extremity [L03.115] ?Multiple open wounds of lower leg [S81.809A] ?Multiple open wounds of left lower leg [S81.802A] ? ?Outpatient hemodialysis: [facility] dialysis.  240 minutes.  Left arm AV fistula.  15-gauge needles.  3 times per week.  MWF.  Target weight 120 kg. ? ?#End-stage renal disease with LE  edema ?Patient will receive dialysis later today, UF goal 2 to 2.5 L as tolerated.  Next treatment scheduled for Friday. ?Dialysis coordinator monitoring discharge planning to assess changes with outpatient clinic. ? ? ?#Anemia of chronic kidney disease ?Lab Results  ?Component Value Date  ? HGB 8.5 (L) 10/11/2021  ?Mircera received outpatient ?Hemoglobin remains below desired target. ?Continue low-dose EPO with dialysis ? ?#Secondary hyperparathyroidism ?Lab Results  ?Component Value Date  ? PTH 758 (H) 08/15/2018  ? CALCIUM 8.4 (L) 10/11/2021  ? PHOS 6.0 (H) 10/11/2021  ?Phosphorus remains elevated, will continue to monitor ? ?#Lower extremity wounds ?Bactrim single strength twice daily ?Antibiotics as per internal medicine team. ? Appreciate vascular performing irrigation debridement of bilateral leg wounds.  Report shows all nonviable tissue removed, more extensive on right than left.   ?Wound VAC removed and replaced with gauze dressing by wound care team.  Pain management by primary today. ? ? ? LOS: 11 ?[place] ?4/5/202311:01 AM ? ?[facility] ?[place], [place] ?[phone] ? ?

## 2021-10-13 NOTE — Progress Notes (Signed)
PT Cancellation Note ? ?Patient Details ?Name: [name] ?MRN: [id number] ?DOB: 11/23/1955 ? ? ?Cancelled Treatment:     Pt off floor unavailable for PT session. Will continue next available time/date. ? ? ?Josie Dixon ?10/13/2021, 2:47 PM ?

## 2021-10-13 NOTE — TOC Progression Note (Signed)
Transition of Care (TOC) - Progression Note  ? ? ?Patient Details  ?Name: VARICK KEYS ?MRN: [id number] ?Date of Birth: 12-Jun-1956 ? ?Transition of Care (TOC) CM/SW Contact  ?Laurena Slimmer, RN ?Phone Number: ?10/13/2021, 12:02 PM ? ?Clinical Narrative:    ?Per Magda Paganini at [facility] CPAP was not delivered today. CPAP will be delivered tomorrow. Patient unable to discharge today.  ? ? ?Expected Discharge Plan: Home/Self Care ?Barriers to Discharge: Continued Medical Work up ? ?Expected Discharge Plan and Services ?Expected Discharge Plan: Home/Self Care ?  ?  ?  ?Living arrangements for the past 2 months: [place] ?                ?  ?  ?  ?  ?  ?  ?  ?  ?  ?  ? ? ?Social Determinants of Health (SDOH) Interventions ?  ? ?Readmission Risk Interventions ? ?  10/03/2021  ?  1:48 PM  ?Readmission Risk Prevention Plan  ?Transportation Screening Complete  ?Medication Review Press photographer) Complete  ?PCP or Specialist appointment within 3-5 days of discharge Complete  ?[place] or Home Care Consult Complete  ?Palliative Care Screening Not Applicable  ?[place] Not Applicable  ? ? ?

## 2021-10-13 NOTE — Progress Notes (Signed)
Mobility Specialist - Progress Note ? ? 10/13/21 1350  ?Mobility  ?Activity Transferred from chair to bed  ?Level of Assistance Minimal assist, patient does 75% or more  ?Assistive Device None  ?Distance Ambulated (ft) 2 ft  ?Activity Response Tolerated well  ?$Mobility charge 1 Mobility  ? ? ? ?Pt transferred chair to bed. MinA +2 to stand without AD. ModI to scoot towards [facility] and return supine. Pt left in bed with transport arriving for dialysis.  ? ? ?Kathee Delton ?Mobility Specialist ?10/13/21, 1:54 PM ? ? ? ? ?

## 2021-10-13 NOTE — Progress Notes (Signed)
?PROGRESS NOTE ? ? ? ?[name]  GYI:[id number] DOB: 1956/04/06 DOA: 10/01/2021 ?PCP: [facility] [place]  ? ? ?Brief Narrative:  ? ?[name] is a 66 year old male with end-stage renal disease on hemodialysis, hypertension, hyperlipidemia, persistent atrial fibrillation on anticoagulation with Eliquis, GERD, BPH, chronic pain syndrome, history of noncompliance with hemodialysis, who presents emergency department for chief concerns of right lower extremity leg pain. ? ?Extensive soft tissue infection with abscess formation bilateral lower extremities.  Taken to the operating room with vascular surgery on 3/30.  Tolerated procedure well.  No immediate complications.  Initially required wound VAC placement.  VAC now discontinued and patient in Unna boots.  Medically stable for discharge at this time.  On p.o. antibiotics. ? ? ? ?Assessment & Plan: ?  ?Principal Problem: ?  Multiple open wounds of lower leg ?Active Problems: ?  HLD (hyperlipidemia) ?  Hypertension ?  ESRD on dialysis [facility]) ?  Anemia of chronic disease ?  CAD (coronary artery disease) ?  Chronic anticoagulation ?  AF (paroxysmal atrial fibrillation) ([place]) ?  Multiple open wounds of left lower leg ? ?Bilateral lower extremity cellulitis with abscess ?Status post I&D on 3/30 ?Tolerated procedure well with no immediate complications ?Was on IV to biotics, now weaned to Bactrim ?Wound VAC removed, Unna boots have been placed ?Plan: ?Continue Bactrim, end date 4/8 ?Continue to boots ?Daily wound care ?As needed pain control ?Medically stable for discharge to skilled nursing facility ? ?Paroxysmal atrial fibrillation ?Coronary artery disease ?On Eliquis, will continue ? ?ESRD on HD ?Anemia of chronic kidney disease ?Nephrology following for inpatient HD needs ? ?Acute urinary retention ?Resolved ? ?Generalized weakness ?Functional decline ?Medically stable for discharge to skilled nursing facility ? ?OSA ?Nightly CPAP ? ?DVT prophylaxis:  Eliquis ?Code Status: Full ?Family Communication: None ?Disposition Plan: Status is: Inpatient ?Remains inpatient appropriate because: Unsafe discharge plan.  Patient medically stable for discharge.  Discharge pending delivery of CPAP to skilled nursing facility. ? ? ?Level of care: Med-Surg ? ?Consultants:  ?Nephrology ?Vascular surgery ? ?Procedures:  ?Bilateral lower extremity I&D 3/30 ? ?Antimicrobials: ?Bactrim ? ? ?Subjective: ?Seen and examined.  Endorses pain in bilateral lower extremities ? ?Objective: ?Vitals:  ? 10/12/21 2028 10/13/21 0405 10/13/21 0846 10/13/21 1328  ?BP: 133/77 117/74 122/62   ?Pulse: 91 63 79 65  ?Resp: 19 20 16 18   ?Temp: 99 ?F (37.2 ?C) 98.1 ?F (36.7 ?C) 97.7 ?F (36.5 ?C) (!) 97.5 ?F (36.4 ?C)  ?TempSrc:   Oral Oral  ?SpO2: 98% 99% 97%   ?Weight:      ?Height:      ? ? ?Intake/Output Summary (Last 24 hours) at 10/13/2021 1330 ?Last data filed at 10/13/2021 1014 ?Gross per 24 hour  ?Intake 560 ml  ?Output --  ?Net 560 ml  ? ?Filed Weights  ? 10/08/21 1321 10/11/21 1039 10/11/21 1430  ?Weight: 123.7 kg 125.3 kg 125.2 kg  ? ? ?Examination: ? ?General exam: No acute distress.  Appears frail chronically ill ?Respiratory system: Clear to auscultation. Respiratory effort normal. ?Cardiovascular system: S1-S2, RRR, no murmurs, pedal edema ?Gastrointestinal system: Abdomen is nondistended, soft and nontender. No organomegaly or masses felt. Normal bowel sounds heard. ?Central nervous system: Alert and oriented. No focal neurological deficits. ?Extremities: Bilateral lower extremities in Unna boots ?Skin: No rashes, lesions or ulcers ?Psychiatry: Judgement and insight appear normal. Mood & affect appropriate.  ? ? ? ?Data Reviewed: I have personally reviewed following labs and imaging studies ? ?  CBC: ?Recent Labs  ?Lab 10/07/21 ?0037 10/08/21 ?0488 10/11/21 ?8916  ?WBC 9.6 13.4* 8.8  ?HGB 8.1* 7.8* 8.5*  ?HCT 24.8* 23.8* 26.4*  ?MCV 86.1 87.2 88.3  ?PLT 128* 124* 154  ? ?Basic Metabolic  Panel: ?Recent Labs  ?Lab 10/07/21 ?9450 10/11/21 ?3888  ?NA 133* 134*  ?K 4.1 4.5  ?CL 94* 95*  ?CO2 26 26  ?GLUCOSE 98 102*  ?BUN 34* 64*  ?CREATININE 5.40* 7.85*  ?CALCIUM 8.5* 8.4*  ?PHOS  --  6.0*  ? ?GFR: ?Estimated Creatinine Clearance: 13.2 mL/min (A) (by C-G formula based on SCr of 7.85 mg/dL (H)). ?Liver Function Tests: ?Recent Labs  ?Lab 10/11/21 ?2800  ?ALBUMIN 2.6*  ? ?No results for input(s): LIPASE, AMYLASE in the last 168 hours. ?No results for input(s): AMMONIA in the last 168 hours. ?Coagulation Profile: ?No results for input(s): INR, PROTIME in the last 168 hours. ?Cardiac Enzymes: ?No results for input(s): CKTOTAL, CKMB, CKMBINDEX, TROPONINI in the last 168 hours. ?BNP (last 3 results) ?No results for input(s): PROBNP in the last 8760 hours. ?HbA1C: ?No results for input(s): HGBA1C in the last 72 hours. ?CBG: ?No results for input(s): GLUCAP in the last 168 hours. ?Lipid Profile: ?No results for input(s): CHOL, HDL, LDLCALC, TRIG, CHOLHDL, LDLDIRECT in the last 72 hours. ?Thyroid Function Tests: ?No results for input(s): TSH, T4TOTAL, FREET4, T3FREE, THYROIDAB in the last 72 hours. ?Anemia Panel: ?No results for input(s): VITAMINB12, FOLATE, FERRITIN, TIBC, IRON, RETICCTPCT in the last 72 hours. ?Sepsis Labs: ?No results for input(s): PROCALCITON, LATICACIDVEN in the last 168 hours. ? ?No results found for this or any previous visit (from the past 240 hour(s)).  ? ? ? ? ? ?Radiology Studies: ?No results found. ? ? ? ? ? ?Scheduled Meds: ? apixaban  5 mg Oral BID  ? atorvastatin  40 mg Oral QHS  ? bumetanide  2 mg Oral 2 times per day on Sun Tue Thu Sat  ? carvedilol  6.25 mg Oral BID  ? epoetin (EPOGEN/PROCRIT) injection  4,000 Units Intravenous Q M,W,F-HD  ? oxyCODONE  20 mg Oral Q12H  ? pantoprazole  80 mg Oral Daily  ? pregabalin  25 mg Oral QHS  ? sulfamethoxazole-trimethoprim  1 tablet Oral Q12H  ? tamsulosin  0.4 mg Oral Daily  ? ?Continuous Infusions: ? sodium chloride 10 mL/hr at 10/08/21  2209  ? ? ? LOS: 11 days  ? ? ? ? ? ?Sidney Ace, MD ?[facility] ? ? ?If 7PM-7AM, please contact night-coverage ? ?10/13/2021, 1:30 PM  ? ?

## 2021-10-13 NOTE — Progress Notes (Signed)
Patient completed dialysis treatment as ordered today. 2.0 Liters removed for over about 3 hours. Patient medicated for pain during treatment, given schedule Epogen. Patient agitated and irritated regarding pain to bilateral legs, patient moaning and shouting out intermittently throughout treatment, Colon Flattery, NP aware. Patient requested to be taken off machine with 38 minutes left of treatment, Colon Flattery, NP informed and in agreement. V/S stable. Patient tolerated treatment well. Report given to floor nurse Ubaldo Glassing D. Morgan-Johnson, Therapist, sports. ?

## 2021-10-14 DIAGNOSIS — S81809A Unspecified open wound, unspecified lower leg, initial encounter: Secondary | ICD-10-CM | POA: Diagnosis not present

## 2021-10-14 LAB — COMPREHENSIVE METABOLIC PANEL
ALT: 17 U/L (ref 0–44)
AST: 20 U/L (ref 15–41)
Albumin: 2.6 g/dL — ABNORMAL LOW (ref 3.5–5.0)
Alkaline Phosphatase: 82 U/L (ref 38–126)
Anion gap: 12 (ref 5–15)
BUN: 36 mg/dL — ABNORMAL HIGH (ref 8–23)
CO2: 28 mmol/L (ref 22–32)
Calcium: 8.4 mg/dL — ABNORMAL LOW (ref 8.9–10.3)
Chloride: 95 mmol/L — ABNORMAL LOW (ref 98–111)
Creatinine, Ser: 5.53 mg/dL — ABNORMAL HIGH (ref 0.61–1.24)
GFR, Estimated: 11 mL/min — ABNORMAL LOW (ref >60)
Glucose, Bld: 96 mg/dL (ref 70–99)
Potassium: 3.9 mmol/L (ref 3.5–5.1)
Sodium: 135 mmol/L (ref 135–145)
Total Bilirubin: 0.7 mg/dL (ref 0.3–1.2)
Total Protein: 7.2 g/dL (ref 6.5–8.1)

## 2021-10-14 LAB — CBC WITH DIFFERENTIAL/PLATELET
Abs Immature Granulocytes: 0.06 10*3/uL (ref 0.00–0.07)
Basophils Absolute: 0.1 10*3/uL (ref 0.0–0.1)
Basophils Relative: 1 %
Eosinophils Absolute: 0.2 10*3/uL (ref 0.0–0.5)
Eosinophils Relative: 2 %
HCT: 27.1 % — ABNORMAL LOW (ref 39.0–52.0)
Hemoglobin: 8.8 g/dL — ABNORMAL LOW (ref 13.0–17.0)
Immature Granulocytes: 1 %
Lymphocytes Relative: 16 %
Lymphs Abs: 1.4 10*3/uL (ref 0.7–4.0)
MCH: 28.4 pg (ref 26.0–34.0)
MCHC: 32.5 g/dL (ref 30.0–36.0)
MCV: 87.4 fL (ref 80.0–100.0)
Monocytes Absolute: 1.5 10*3/uL — ABNORMAL HIGH (ref 0.1–1.0)
Monocytes Relative: 17 %
Neutro Abs: 5.5 10*3/uL (ref 1.7–7.7)
Neutrophils Relative %: 63 %
Platelets: 153 10*3/uL (ref 150–400)
RBC: 3.1 MIL/uL — ABNORMAL LOW (ref 4.22–5.81)
RDW: 17.2 % — ABNORMAL HIGH (ref 11.5–15.5)
WBC: 8.8 10*3/uL (ref 4.0–10.5)
nRBC: 0 % (ref 0.0–0.2)

## 2021-10-14 MED ORDER — SULFAMETHOXAZOLE-TRIMETHOPRIM 400-80 MG PO TABS
1.0000 | ORAL_TABLET | Freq: Two times a day (BID) | ORAL | 0 refills | Status: AC
Start: 2021-10-14 — End: 2021-10-16

## 2021-10-14 MED ORDER — OXYCODONE HCL 5 MG PO TABS
10.0000 mg | ORAL_TABLET | ORAL | Status: DC | PRN
Start: 2021-10-14 — End: 2021-10-14
  Administered 2021-10-14: 10 mg via ORAL
  Filled 2021-10-14: qty 2

## 2021-10-14 MED ORDER — OXYCODONE HCL 10 MG PO TABS: 10.0000 mg | ORAL_TABLET | ORAL | 0 refills | Status: AC | PRN

## 2021-10-14 MED ORDER — OXYCODONE HCL ER 20 MG PO T12A: 20.0000 mg | EXTENDED_RELEASE_TABLET | Freq: Two times a day (BID) | ORAL | 0 refills | Status: AC

## 2021-10-14 MED ORDER — BUMETANIDE 2 MG PO TABS: 2.0000 mg | ORAL_TABLET | Freq: Every day | ORAL | Status: AC

## 2021-10-14 NOTE — Discharge Summary (Signed)
Physician Discharge Summary  ?CRIXUS MCAULAY FBX:[id number] DOB: [birth year] DOA: 10/01/2021 ? ?PCP: [facility] [place] ? ?Admit date: 10/01/2021 ?Discharge date: 10/14/2021 ? ?Admitted From: Home ?Disposition:  SNF ? ?Recommendations for Outpatient Follow-up:  ?Follow up with PCP in 1-2 weeks ? ? ?Home Health:No ?Equipment/Devices:None  ? ?Discharge Condition:Stable  ?CODE STATUS:FULL ?Diet recommendation: Heart healthy ? ?Brief/Interim Summary: ?[name] is a 66 year old male with end-stage renal disease on hemodialysis, hypertension, hyperlipidemia, persistent atrial fibrillation on anticoagulation with Eliquis, GERD, BPH, chronic pain syndrome, history of noncompliance with hemodialysis, who presents emergency department for chief concerns of right lower extremity leg pain. ?  ?Extensive soft tissue infection with abscess formation bilateral lower extremities.  Taken to the operating room with vascular surgery on 3/30.  Tolerated procedure well.  No immediate complications.  Initially required wound VAC placement.  VAC now discontinued and patient in Unna boots.  Medically stable for discharge at this time.  On p.o. antibiotics. ? ?Discharge Diagnoses:  ?Principal Problem: ?  Multiple open wounds of lower leg ?Active Problems: ?  HLD (hyperlipidemia) ?  Hypertension ?  ESRD on dialysis [facility]) ?  Anemia of chronic disease ?  CAD (coronary artery disease) ?  Chronic anticoagulation ?  AF (paroxysmal atrial fibrillation) ([place]) ?  Multiple open wounds of left lower leg ? ?Bilateral lower extremity cellulitis with abscess ?Status post I&D on 3/30 ?Tolerated procedure well with no immediate complications ?Was on IV to biotics, now weaned to Bactrim ?Wound VAC removed, Unna boots have been placed ?Plan: ?Continue Bactrim, end date 4/8 ?Continue Unna boots ?Daily wound care ?As needed pain control ?Medically stable for discharge to skilled nursing facility ?  ?Paroxysmal atrial fibrillation ?Coronary artery  disease ?On Eliquis, will continue ?  ?ESRD on HD ?Anemia of chronic kidney disease ?Nephrology following for inpatient HD needs ?  ?Acute urinary retention ?Resolved ?  ?Generalized weakness ?Functional decline ?Medically stable for discharge to skilled nursing facility ?  ?OSA ?Nightly CPAP ? ?Discharge Instructions ? ?Discharge Instructions   ? ? Diet - low sodium heart healthy   Complete by: As directed ?  ? Discharge wound care:   Complete by: As directed ?  ? Wound care  Weekly      ?Comments: Top open wounds with silver hydrofiber, ABD pads.  ?[place] nurse to place Unna's boots over this ?Change weekly ? 10/12/21 1025  ?  10/12/21 1023    ?Wound care  Every shift  ? Increase activity slowly   Complete by: As directed ?  ? ?  ? ?Allergies as of 10/14/2021   ? ?   Reactions  ? Cyclobenzaprine Nausea Only  ? Amlodipine   ? Clonidine Other (See Comments)  ? Asymptomatic bradycardia to 38  ? Furosemide Other (See Comments)  ? Caused renal failure  ? Tylenol [acetaminophen] Other (See Comments)  ? Reaction:  Pt states it bothers his liver  ? ?  ? ?  ?Medication List  ?  ? ?STOP taking these medications   ? ?silver sulfADIAZINE 1 % cream ?Commonly known as: SILVADENE ?  ?triamcinolone 0.025 % cream ?Commonly known as: KENALOG ?  ?zinc oxide 20 % ointment ?  ? ?  ? ?TAKE these medications   ? ?albuterol 108 (90 Base) MCG/ACT inhaler ?Commonly known as: VENTOLIN HFA ?Inhale 2 puffs into the lungs every 6 (six) hours as needed for wheezing or shortness of breath. ?  ?apixaban 5 MG Tabs tablet ?Commonly known as:  Eliquis ?Take 1 tablet (5 mg total) by mouth 2 (two) times daily. ?  ?atorvastatin 40 MG tablet ?Commonly known as: LIPITOR ?Take 1 tablet (40 mg total) by mouth at bedtime. ?  ?bumetanide 2 MG tablet ?Commonly known as: BUMEX ?Take 1 tablet (2 mg total) by mouth daily. ?What changed:  ?medication strength ?how much to take ?when to take this ?additional instructions ?  ?carvedilol 6.25 MG tablet ?Commonly known as:  COREG ?Take 6.25 mg by mouth 2 (two) times daily. ?  ?diclofenac Sodium 1 % Gel ?Commonly known as: VOLTAREN ?Apply 2 g topically 4 (four) times daily. ?  ?naloxone 4 MG/0.1ML Liqd nasal spray kit ?Commonly known as: NARCAN ?SMARTSIG:Both Nares ?  ?omeprazole 40 MG capsule ?Commonly known as: PRILOSEC ?Take 40 mg by mouth 2 (two) times daily. ?  ?ondansetron 8 MG tablet ?Commonly known as: ZOFRAN ?TAKE ONE-HALF TABLET BY MOUTH EVERY 8 HOURS AS NEEDED NAUSEA ?  ?Oxycodone HCl 10 MG Tabs ?Take 1 tablet (10 mg total) by mouth every 4 (four) hours as needed for up to 3 days for severe pain or breakthrough pain. SNF use only.  Refills to be considered by SNF providers ?What changed: You were already taking a medication with the same name, and this prescription was added. Make sure you understand how and when to take each. ?  ?oxyCODONE 20 mg 12 hr tablet ?Commonly known as: OXYCONTIN ?Take 1 tablet (20 mg total) by mouth every 12 (twelve) hours for 3 days. SNF use only.  Refills to be considered by SNF providers ?What changed: additional instructions ?  ?polyethylene glycol 17 g packet ?Commonly known as: MIRALAX / GLYCOLAX ?Take 17 g by mouth daily. ?  ?pregabalin 25 MG capsule ?Commonly known as: LYRICA ?Take 25 mg by mouth at bedtime. ?  ?sulfamethoxazole-trimethoprim 400-80 MG tablet ?Commonly known as: BACTRIM ?Take 1 tablet by mouth every 12 (twelve) hours for 2 days. ?  ?tamsulosin 0.4 MG Caps capsule ?Commonly known as: FLOMAX ?Take 0.4 mg by mouth daily. ?  ? ?  ? ?  ?  ? ? ?  ?Durable Medical Equipment  ?(From admission, onward)  ?  ? ? ?  ? ?  Start     Ordered  ? 10/07/21 1713  For home use only DME Negative pressure wound device  Once       ?Question Answer Comment  ?Frequency of dressing change 2 times per week   ?Length of need 3 Months   ?Dressing type Foam   ?Amount of suction 125 mm/Hg   ?Pressure application Continuous pressure   ?Supplies 10 canisters and 15 dressings per month for duration of therapy    ?  ? 10/07/21 1712  ? ?  ?  ? ?  ? ? ?  ?Discharge Care Instructions  ?(From admission, onward)  ?  ? ? ?  ? ?  Start     Ordered  ? 10/14/21 0000  Discharge wound care:       ?Comments: Wound care  Weekly      ?Comments: Top open wounds with silver hydrofiber, ABD pads.  ?[place] nurse to place Unna's boots over this ?Change weekly ? 10/12/21 1025  ?  10/12/21 1023    ?Wound care  Every shift  ? 10/14/21 0903  ? ?  ?  ? ?  ? ? Contact information for after-discharge care   ? ? Destination   ? ? [facility] Preferred SNF .   ?Service: Skilled Nursing ?Contact information: ?[address] ?[place] ?[phone] ? ?  ?  ? ?  ?  ? ?  ?  ? ?  ? ?Allergies  ?Allergen Reactions  ? Cyclobenzaprine Nausea Only  ? Amlodipine   ? Clonidine Other (See Comments)  ?  Asymptomatic bradycardia to 38  ? Furosemide Other (See Comments)  ?  Caused renal failure  ? Tylenol [Acetaminophen] Other (See Comments)  ?  Reaction:  Pt states it bothers his liver  ? ? ?Consultations: ?Vascular sx ? ? ?Procedures/Studies: ?DG Tibia/Fibula Right ? ?Result Date: 10/01/2021 ?CLINICAL DATA:  Right lower extremity pain. Evaluate for soft tissue gas. EXAM: RIGHT TIBIA AND FIBULA - 2 VIEW COMPARISON:  None. FINDINGS: There is no acute fracture or dislocation. Mild arthritic changes of the right knee. There is diffuse subcutaneous edema. No radiopaque foreign object or soft tissue gas. Skin ulceration of the distal leg. IMPRESSION: 1. No acute fracture or dislocation. 2. Diffuse subcutaneous edema. Skin ulceration of the distal leg. No radiopaque foreign object or soft tissue gas. Electronically Signed   By: Anner Crete M.D.   On: 10/01/2021 21:44  ? ?US ARTERIAL LOWER EXTREMITY DUPLEX BILATERAL ? ?Result Date: 10/07/2021 ?CLINICAL DATA:  Bilateral lower extremity venous ulcerations. Evaluate arterial circulation prior to debridement. EXAM:  BILATERAL LOWER EXTREMITY ARTERIAL DUPLEX SCAN TECHNIQUE: Gray-scale sonography as well as color Doppler and duplex ultrasound was performed to evaluate the arteries of both lower extremities including the

## 2021-10-14 NOTE — Care Management Important Message (Signed)
Important Message ? ?Patient Details  ?Name: [name] ?MRN: [id number] ?Date of Birth: 1955/10/31 ? ? ?Medicare Important Message Given:  Yes ? ? ? ? ?Dannette Barbara ?10/14/2021, 1:37 PM ?

## 2021-10-14 NOTE — Progress Notes (Signed)
Mobility Specialist - Progress Note ? ? 10/14/21 1000  ?Mobility  ?Range of Motion/Exercises Right leg;Left leg  ?Level of Assistance Standby assist, set-up cues, supervision of patient - no hands on  ?Assistive Device None  ?Distance Ambulated (ft) 0 ft  ?Activity Response Tolerated well  ?$Mobility charge 1 Mobility  ? ? ? ?Pt sitting in recliner upon arrival, utilizing RA. Pt participated in seated therex with supervision. Pt left in chair with needs in reach.  ? ?Kathee Delton ?Mobility Specialist ?10/14/21, 10:37 AM ? ? ? ? ?

## 2021-10-14 NOTE — Plan of Care (Signed)
  Problem: Clinical Measurements: Goal: Will remain free from infection Outcome: Progressing   Problem: Pain Managment: Goal: General experience of comfort will improve Outcome: Progressing   Problem: Safety: Goal: Ability to remain free from injury will improve Outcome: Progressing   Problem: Skin Integrity: Goal: Risk for impaired skin integrity will decrease Outcome: Progressing   

## 2021-10-14 NOTE — Progress Notes (Signed)
Patient is medically cleared with EMS staff here to transport patient to [facility].  Report was given to [facility] LPN, who will be receiving the patient at facility prior to transport.  Patient's PIV removed and all appropriate discharge paperwork given to EMS personnel for receiving facility.  Patient discharged off the unit at 1341. ?

## 2021-10-14 NOTE — Progress Notes (Signed)
[facility] ?[place], [place] ?10/14/21 ? ?Subjective:  ? ?Hospital day # 12 ? ?Patient known to our practice from previous admissions  ?This time patient presents for right lower extremity pain from the wound. ?Treated with broad-spectrum antibiotics including cefepime, metronidazole and vancomycin in the emergency room. ?Admitted for wound evaluation by surgical services and IV antibiotics. ? ?Update ?Patient sitting in a chair ?Continues to complain of pain, awaiting pain medication ?Lower extremity edema remains ? ? ?Objective:  ?Vital signs in last 24 hours:  ?Temp:  [97.5 ?F (36.4 ?C)-99.4 ?F (37.4 ?C)] 99.4 ?F (37.4 ?C) (04/06 0759) ?Pulse Rate:  [30-100] 76 (04/06 0759) ?Resp:  [15-67] 18 (04/06 0759) ?BP: (113-130)/(60-88) 128/69 (04/06 0759) ?SpO2:  [86 %-100 %] 100 % (04/06 0759) ?Weight:  [119 kg-122 kg] 119 kg (04/05 1712) ? ?Weight change:  ?Filed Weights  ? 10/11/21 1430 10/13/21 1331 10/13/21 1712  ?Weight: 125.2 kg 122 kg 119 kg  ? ? ?Intake/Output: ?  ? ?Intake/Output Summary (Last 24 hours) at 10/14/2021 1247 ?Last data filed at 10/14/2021 1049 ?Gross per 24 hour  ?Intake 720 ml  ?Output 2073 ml  ?Net -1353 ml  ? ? ? ? ?Physical Exam: ?General: NAD  ?HEENT Anicteric, moist oral mucous membranes  ?Pulm/lungs Normal breathing effort on room air  ?CVS/Heart No rub or gallop, irregular  ?Abdomen:  Soft  ?Extremities: 2+  pitting edema bilateral lower extremities, Bilat LE gauze dressings  ?Neurologic: Alert oriented  ?Skin: skin changes from chronic edema bilaterally  ?Access: Left upper arm AV fistula, good thrill  ?   ? ? ?Basic Metabolic Panel: ? ?Recent Labs  ?Lab 10/11/21 ?2703 10/14/21 ?0747  ?NA 134* 135  ?K 4.5 3.9  ?CL 95* 95*  ?CO2 26 28  ?GLUCOSE 102* 96  ?BUN 64* 36*  ?CREATININE 7.85* 5.53*  ?CALCIUM 8.4* 8.4*  ?PHOS 6.0*  --   ? ? ? ? ?CBC: ?Recent Labs  ?Lab 10/08/21 ?0614 10/11/21 ?5009 10/14/21 ?0747  ?WBC 13.4* 8.8 8.8  ?NEUTROABS  --   --  5.5  ?HGB 7.8* 8.5* 8.8*   ?HCT 23.8* 26.4* 27.1*  ?MCV 87.2 88.3 87.4  ?PLT 124* 154 153  ? ? ? ?  ?Lab Results  ?Component Value Date  ? HEPBSAG NON REACTIVE 10/04/2021  ? HEPBSAB Reactive (A) 10/04/2021  ? HEPBIGM Negative 11/15/2018  ? ? ? ? ?Microbiology: ? ?No results found for this or any previous visit (from the past 240 hour(s)). ? ? ?Coagulation Studies: ?No results for input(s): LABPROT, INR in the last 72 hours. ? ?Urinalysis: ?No results for input(s): COLORURINE, LABSPEC, [place], GLUCOSEU, HGBUR, BILIRUBINUR, KETONESUR, PROTEINUR, UROBILINOGEN, NITRITE, LEUKOCYTESUR in the last 72 hours. ? ?Invalid input(s): APPERANCEUR  ? ? ?Imaging: ?No results found. ? ? ?Medications:  ? ? sodium chloride 10 mL/hr at 10/08/21 2209  ? ? apixaban  5 mg Oral BID  ? atorvastatin  40 mg Oral QHS  ? bumetanide  2 mg Oral 2 times per day on Sun Tue Thu Sat  ? carvedilol  6.25 mg Oral BID  ? epoetin (EPOGEN/PROCRIT) injection  4,000 Units Intravenous Q M,W,F-HD  ? oxyCODONE  20 mg Oral Q12H  ? pantoprazole  80 mg Oral Daily  ? pregabalin  25 mg Oral QHS  ? sulfamethoxazole-trimethoprim  1 tablet Oral Q12H  ? tamsulosin  0.4 mg Oral Daily  ? ?sodium chloride, albuterol, labetalol, oxyCODONE, polyethylene glycol ? ?Assessment/ Plan:  ?66 y.o. male with end-stage renal disease, hypertension,  atrial fibrillation, coronary artery disease, pulmonary embolism, hyperlipidemia, polysubstance abuse, history of chronic pain syndrome ?Admission at [facility] for acute respiratory failure, COVID-19  in January 2023 ?This time he is admitted on 10/01/2021 for ?Right leg pain [M79.604] ?Cellulitis of right lower extremity [L03.115] ?Multiple open wounds of lower leg [S81.809A] ?Multiple open wounds of left lower leg [S81.802A] ? ?Outpatient hemodialysis: [facility] dialysis.  240 minutes.  Left arm AV fistula.  15-gauge needles.  3 times per week.  MWF.  Target weight 120 kg. ? ?#End-stage renal disease with LE edema ?Dialysis received yesterday. UF goal 2 L  achieved prior to patient requested to sign off early. Treatment terminated with 38 minutes remaining. Next treatment scheduled for Friday. ?Dialysis coordinator aware of rehab placement and monitoring for outpatient needs. ? ? ?#Anemia of chronic kidney disease ?Lab Results  ?Component Value Date  ? HGB 8.8 (L) 10/14/2021  ?Mircera received outpatient ?Hemoglobin just below target. ?Low-dose EPO with dialysis ? ?#Secondary hyperparathyroidism ?Lab Results  ?Component Value Date  ? PTH 758 (H) 08/15/2018  ? CALCIUM 8.4 (L) 10/14/2021  ? PHOS 6.0 (H) 10/11/2021  ?Calcium and phosphorus within acceptable range.  ? ?#Lower extremity wounds ?Bactrim single strength twice daily ?Antibiotics as per internal medicine team. ? Appreciate vascular performing irrigation debridement of bilateral leg wounds.  Report shows all nonviable tissue removed, more extensive on right than left.   ?Pain management  ? ? ? LOS: 12 ?[place] ?4/6/202312:47 PM ? ?[facility] ?[place], [place] ?[phone] ? ?

## 2021-10-14 NOTE — TOC Transition Note (Signed)
Transition of Care (TOC) - CM/SW Discharge Note ? ? ?Patient Details  ?Name: [name] ?MRN: [id number] ?Date of Birth: 03-26-1956 ? ?Transition of Care (TOC) CM/SW Contact:  ?Laurena Slimmer, RN ?Phone Number: ?10/14/2021, 10:33 AM ? ? ?Clinical Narrative:    ? ?Patient will DC to: [facility] ?Anticipated DC date: 10/14/2021 ?Family notified: Patient declined ?Transport by: [facility] ? ?Per MD patient ready for DC to . RN, patient, patient's family, and facility notified of DC. Discharge Summary sent to facility. RN given number for report. DC packet on chart. Ambulance transport requested for patient.  ?TOC signing off. ? ?Mariea Clonts, RN, BSN, CCM ?[phone] ? ? ? ?Final next level of care: [place] ?Barriers to Discharge: Continued Medical Work up ? ? ?Patient Goals and CMS Choice ?Patient states their goals for this hospitalization and ongoing recovery are:: To return to facility ?CMS Medicare.gov Compare Post Acute Care list provided to:: Patient ?Choice offered to / list presented to : Patient ? ?Discharge Placement ?  ?           ?Patient chooses bed at: [facility] ?Patient to be transferred to facility by: [facility] ?Name of family member notified: Patient declined ?  ? ?Discharge Plan and Services ?  ?  ?           ?  ?  ?  ?  ?  ?  ?  ?  ?  ?  ? ?Social Determinants of Health (SDOH) Interventions ?  ? ? ?Readmission Risk Interventions ? ?  10/03/2021  ?  1:48 PM  ?Readmission Risk Prevention Plan  ?Transportation Screening Complete  ?Medication Review Press photographer) Complete  ?PCP or Specialist appointment within 3-5 days of discharge Complete  ?[place] or Home Care Consult Complete  ?Palliative Care Screening Not Applicable  ?[place] Not Applicable  ? ? ? ? ? ?

## 2021-10-18 ENCOUNTER — Other Ambulatory Visit: Payer: Self-pay

## 2021-10-18 ENCOUNTER — Emergency Department: Payer: Medicare Other

## 2021-10-18 ENCOUNTER — Inpatient Hospital Stay
Admission: EM | Admit: 2021-10-18 | Discharge: 2021-10-21 | DRG: 602 | Disposition: A | Discharge status: Home Health | Payer: Medicare Other | Attending: Internal Medicine | Admitting: Internal Medicine

## 2021-10-18 ENCOUNTER — Encounter: Payer: Self-pay | Admitting: Internal Medicine

## 2021-10-18 DIAGNOSIS — L97909 Non-pressure chronic ulcer of unspecified part of unspecified lower leg with unspecified severity: Secondary | ICD-10-CM | POA: Diagnosis not present

## 2021-10-18 DIAGNOSIS — I77810 Thoracic aortic ectasia: Secondary | ICD-10-CM | POA: Diagnosis present

## 2021-10-18 DIAGNOSIS — N2581 Secondary hyperparathyroidism of renal origin: Secondary | ICD-10-CM | POA: Diagnosis present

## 2021-10-18 DIAGNOSIS — Z992 Dependence on renal dialysis: Secondary | ICD-10-CM

## 2021-10-18 DIAGNOSIS — L03115 Cellulitis of right lower limb: Secondary | ICD-10-CM | POA: Diagnosis present

## 2021-10-18 DIAGNOSIS — D631 Anemia in chronic kidney disease: Secondary | ICD-10-CM | POA: Diagnosis present

## 2021-10-18 DIAGNOSIS — F32A Depression, unspecified: Secondary | ICD-10-CM | POA: Diagnosis present

## 2021-10-18 DIAGNOSIS — E785 Hyperlipidemia, unspecified: Secondary | ICD-10-CM | POA: Diagnosis present

## 2021-10-18 DIAGNOSIS — I5022 Chronic systolic (congestive) heart failure: Secondary | ICD-10-CM | POA: Diagnosis present

## 2021-10-18 DIAGNOSIS — D696 Thrombocytopenia, unspecified: Secondary | ICD-10-CM | POA: Diagnosis present

## 2021-10-18 DIAGNOSIS — I132 Hypertensive heart and chronic kidney disease with heart failure and with stage 5 chronic kidney disease, or end stage renal disease: Secondary | ICD-10-CM | POA: Diagnosis present

## 2021-10-18 DIAGNOSIS — L03116 Cellulitis of left lower limb: Secondary | ICD-10-CM | POA: Diagnosis present

## 2021-10-18 DIAGNOSIS — Z8249 Family history of ischemic heart disease and other diseases of the circulatory system: Secondary | ICD-10-CM

## 2021-10-18 DIAGNOSIS — G894 Chronic pain syndrome: Secondary | ICD-10-CM | POA: Diagnosis present

## 2021-10-18 DIAGNOSIS — I890 Lymphedema, not elsewhere classified: Secondary | ICD-10-CM | POA: Diagnosis not present

## 2021-10-18 DIAGNOSIS — I4821 Permanent atrial fibrillation: Secondary | ICD-10-CM | POA: Diagnosis present

## 2021-10-18 DIAGNOSIS — Z6836 Body mass index (BMI) 36.0-36.9, adult: Secondary | ICD-10-CM

## 2021-10-18 DIAGNOSIS — I2510 Atherosclerotic heart disease of native coronary artery without angina pectoris: Secondary | ICD-10-CM | POA: Diagnosis present

## 2021-10-18 DIAGNOSIS — R531 Weakness: Secondary | ICD-10-CM | POA: Diagnosis not present

## 2021-10-18 DIAGNOSIS — I87319 Chronic venous hypertension (idiopathic) with ulcer of unspecified lower extremity: Secondary | ICD-10-CM | POA: Diagnosis not present

## 2021-10-18 DIAGNOSIS — Z888 Allergy status to other drugs, medicaments and biological substances status: Secondary | ICD-10-CM | POA: Diagnosis not present

## 2021-10-18 DIAGNOSIS — Z86711 Personal history of pulmonary embolism: Secondary | ICD-10-CM

## 2021-10-18 DIAGNOSIS — Z8616 Personal history of COVID-19: Secondary | ICD-10-CM

## 2021-10-18 DIAGNOSIS — K219 Gastro-esophageal reflux disease without esophagitis: Secondary | ICD-10-CM | POA: Diagnosis present

## 2021-10-18 DIAGNOSIS — Z7901 Long term (current) use of anticoagulants: Secondary | ICD-10-CM

## 2021-10-18 DIAGNOSIS — F17210 Nicotine dependence, cigarettes, uncomplicated: Secondary | ICD-10-CM | POA: Diagnosis present

## 2021-10-18 DIAGNOSIS — Z91199 Patient's noncompliance with other medical treatment and regimen due to unspecified reason: Secondary | ICD-10-CM

## 2021-10-18 DIAGNOSIS — L0390 Cellulitis, unspecified: Secondary | ICD-10-CM | POA: Diagnosis present

## 2021-10-18 DIAGNOSIS — N186 End stage renal disease: Secondary | ICD-10-CM | POA: Diagnosis not present

## 2021-10-18 DIAGNOSIS — L03119 Cellulitis of unspecified part of limb: Secondary | ICD-10-CM | POA: Diagnosis not present

## 2021-10-18 DIAGNOSIS — Z79899 Other long term (current) drug therapy: Secondary | ICD-10-CM

## 2021-10-18 LAB — CBC WITH DIFFERENTIAL/PLATELET
Abs Immature Granulocytes: 0.03 10*3/uL (ref 0.00–0.07)
Abs Immature Granulocytes: 0.03 10*3/uL (ref 0.00–0.07)
Basophils Absolute: 0 10*3/uL (ref 0.0–0.1)
Basophils Absolute: 0 10*3/uL (ref 0.0–0.1)
Basophils Relative: 0 %
Basophils Relative: 0 %
Eosinophils Absolute: 0.2 10*3/uL (ref 0.0–0.5)
Eosinophils Absolute: 0.2 10*3/uL (ref 0.0–0.5)
Eosinophils Relative: 2 %
Eosinophils Relative: 2 %
HCT: 27.3 % — ABNORMAL LOW (ref 39.0–52.0)
HCT: 25.4 % — ABNORMAL LOW (ref 39.0–52.0)
Hemoglobin: 8.7 g/dL — ABNORMAL LOW (ref 13.0–17.0)
Hemoglobin: 8.2 g/dL — ABNORMAL LOW (ref 13.0–17.0)
Immature Granulocytes: 0 %
Immature Granulocytes: 0 %
Lymphocytes Relative: 13 %
Lymphocytes Relative: 11 %
Lymphs Abs: 1.1 10*3/uL (ref 0.7–4.0)
Lymphs Abs: 0.9 10*3/uL (ref 0.7–4.0)
MCH: 28.2 pg (ref 26.0–34.0)
MCH: 28.7 pg (ref 26.0–34.0)
MCHC: 31.9 g/dL (ref 30.0–36.0)
MCHC: 32.3 g/dL (ref 30.0–36.0)
MCV: 88.3 fL (ref 80.0–100.0)
MCV: 88.8 fL (ref 80.0–100.0)
Monocytes Absolute: 1.2 10*3/uL — ABNORMAL HIGH (ref 0.1–1.0)
Monocytes Absolute: 1.1 10*3/uL — ABNORMAL HIGH (ref 0.1–1.0)
Monocytes Relative: 15 %
Monocytes Relative: 14 %
Neutro Abs: 5.6 10*3/uL (ref 1.7–7.7)
Neutro Abs: 5.9 10*3/uL (ref 1.7–7.7)
Neutrophils Relative %: 70 %
Neutrophils Relative %: 73 %
Platelets: 136 10*3/uL — ABNORMAL LOW (ref 150–400)
Platelets: 126 10*3/uL — ABNORMAL LOW (ref 150–400)
RBC: 3.09 MIL/uL — ABNORMAL LOW (ref 4.22–5.81)
RBC: 2.86 MIL/uL — ABNORMAL LOW (ref 4.22–5.81)
RDW: 16.5 % — ABNORMAL HIGH (ref 11.5–15.5)
RDW: 16.3 % — ABNORMAL HIGH (ref 11.5–15.5)
WBC: 8.1 10*3/uL (ref 4.0–10.5)
WBC: 8.1 10*3/uL (ref 4.0–10.5)
nRBC: 0 % (ref 0.0–0.2)
nRBC: 0 % (ref 0.0–0.2)

## 2021-10-18 LAB — COMPREHENSIVE METABOLIC PANEL
ALT: 28 U/L (ref 0–44)
ALT: 25 U/L (ref 0–44)
AST: 33 U/L (ref 15–41)
AST: 29 U/L (ref 15–41)
Albumin: 2.6 g/dL — ABNORMAL LOW (ref 3.5–5.0)
Albumin: 2.5 g/dL — ABNORMAL LOW (ref 3.5–5.0)
Alkaline Phosphatase: 120 U/L (ref 38–126)
Alkaline Phosphatase: 111 U/L (ref 38–126)
Anion gap: 12 (ref 5–15)
Anion gap: 14 (ref 5–15)
BUN: 61 mg/dL — ABNORMAL HIGH (ref 8–23)
BUN: 64 mg/dL — ABNORMAL HIGH (ref 8–23)
CO2: 27 mmol/L (ref 22–32)
CO2: 26 mmol/L (ref 22–32)
Calcium: 8.7 mg/dL — ABNORMAL LOW (ref 8.9–10.3)
Calcium: 8.5 mg/dL — ABNORMAL LOW (ref 8.9–10.3)
Chloride: 96 mmol/L — ABNORMAL LOW (ref 98–111)
Chloride: 96 mmol/L — ABNORMAL LOW (ref 98–111)
Creatinine, Ser: 8.53 mg/dL — ABNORMAL HIGH (ref 0.61–1.24)
Creatinine, Ser: 8.74 mg/dL — ABNORMAL HIGH (ref 0.61–1.24)
GFR, Estimated: 6 mL/min — ABNORMAL LOW (ref >60)
GFR, Estimated: 6 mL/min — ABNORMAL LOW (ref >60)
Glucose, Bld: 98 mg/dL (ref 70–99)
Glucose, Bld: 107 mg/dL — ABNORMAL HIGH (ref 70–99)
Potassium: 4.9 mmol/L (ref 3.5–5.1)
Potassium: 5 mmol/L (ref 3.5–5.1)
Sodium: 135 mmol/L (ref 135–145)
Sodium: 136 mmol/L (ref 135–145)
Total Bilirubin: 1 mg/dL (ref 0.3–1.2)
Total Bilirubin: 1 mg/dL (ref 0.3–1.2)
Total Protein: 7.7 g/dL (ref 6.5–8.1)
Total Protein: 7.4 g/dL (ref 6.5–8.1)

## 2021-10-18 LAB — MAGNESIUM: Magnesium: 2.1 mg/dL (ref 1.7–2.4)

## 2021-10-18 LAB — PHOSPHORUS: Phosphorus: 6.5 mg/dL — ABNORMAL HIGH (ref 2.5–4.6)

## 2021-10-18 MED ORDER — VANCOMYCIN HCL IN DEXTROSE 1-5 GM/200ML-% IV SOLN
1000.0000 mg | INTRAVENOUS | Status: DC
  Administered 2021-10-18 – 2021-10-20 (×2): 1000 mg via INTRAVENOUS
  Filled 2021-10-18 (×2): qty 200

## 2021-10-18 MED ORDER — POLYETHYLENE GLYCOL 3350 17 G PO PACK: 17.0000 g | PACK | Freq: Every day | ORAL | Status: DC | PRN

## 2021-10-18 MED ORDER — HYDROMORPHONE HCL 1 MG/ML IJ SOLN
1.0000 mg | INTRAMUSCULAR | Status: DC | PRN
  Administered 2021-10-18 – 2021-10-21 (×15): 1 mg via INTRAVENOUS
  Filled 2021-10-18 (×18): qty 1

## 2021-10-18 MED ORDER — OXYCODONE HCL 5 MG PO TABS
10.0000 mg | ORAL_TABLET | Freq: Once | ORAL | Status: AC
  Administered 2021-10-18: 10 mg via ORAL
  Filled 2021-10-18: qty 2

## 2021-10-18 MED ORDER — CHLORHEXIDINE GLUCONATE CLOTH 2 % EX PADS
6.0000 | MEDICATED_PAD | Freq: Every day | CUTANEOUS | Status: DC
  Administered 2021-10-19 – 2021-10-21 (×3): 6 via TOPICAL

## 2021-10-18 MED ORDER — APIXABAN 5 MG PO TABS
5.0000 mg | ORAL_TABLET | Freq: Two times a day (BID) | ORAL | Status: DC
  Administered 2021-10-18 – 2021-10-21 (×6): 5 mg via ORAL
  Filled 2021-10-18 (×6): qty 1

## 2021-10-18 MED ORDER — VANCOMYCIN HCL IN DEXTROSE 1-5 GM/200ML-% IV SOLN
1000.0000 mg | Freq: Once | INTRAVENOUS | Status: AC
Start: 2021-10-18 — End: 2021-10-18
  Administered 2021-10-18: 1000 mg via INTRAVENOUS
  Filled 2021-10-18: qty 200

## 2021-10-18 MED ORDER — PREGABALIN 25 MG PO CAPS
25.0000 mg | ORAL_CAPSULE | Freq: Every day | ORAL | Status: DC
  Administered 2021-10-18 – 2021-10-20 (×3): 25 mg via ORAL
  Filled 2021-10-18 (×3): qty 1

## 2021-10-18 MED ORDER — VANCOMYCIN HCL 1500 MG/300ML IV SOLN
1500.0000 mg | Freq: Once | INTRAVENOUS | Status: AC
  Administered 2021-10-18: 1500 mg via INTRAVENOUS
  Filled 2021-10-18: qty 300

## 2021-10-18 MED ORDER — SODIUM CHLORIDE 0.9 % IV SOLN
1.0000 g | Freq: Once | INTRAVENOUS | Status: AC
  Administered 2021-10-18: 1 g via INTRAVENOUS
  Filled 2021-10-18: qty 10

## 2021-10-18 MED ORDER — HYDROMORPHONE HCL 1 MG/ML IJ SOLN
0.5000 mg | INTRAMUSCULAR | Status: DC | PRN
  Administered 2021-10-18 (×2): 0.5 mg via INTRAVENOUS
  Filled 2021-10-18 (×2): qty 1

## 2021-10-18 MED ORDER — OXYCODONE HCL 5 MG PO TABS
10.0000 mg | ORAL_TABLET | Freq: Four times a day (QID) | ORAL | Status: DC | PRN
  Administered 2021-10-18: 10 mg via ORAL
  Filled 2021-10-18 (×2): qty 2

## 2021-10-18 MED ORDER — VANCOMYCIN HCL IN DEXTROSE 1-5 GM/200ML-% IV SOLN
1000.0000 mg | INTRAVENOUS | Status: DC
Start: 2021-10-18 — End: 2021-10-18

## 2021-10-18 MED ORDER — EPOETIN ALFA 10000 UNIT/ML IJ SOLN
10000.0000 [IU] | INTRAMUSCULAR | Status: DC
  Administered 2021-10-18 – 2021-10-20 (×2): 10000 [IU] via INTRAVENOUS
  Filled 2021-10-18 (×2): qty 1

## 2021-10-18 MED ORDER — EPOETIN ALFA 10000 UNIT/ML IJ SOLN
INTRAMUSCULAR | Status: AC
  Filled 2021-10-18: qty 1

## 2021-10-18 MED ORDER — TAMSULOSIN HCL 0.4 MG PO CAPS
0.4000 mg | ORAL_CAPSULE | Freq: Every day | ORAL | Status: DC
  Administered 2021-10-18 – 2021-10-21 (×4): 0.4 mg via ORAL
  Filled 2021-10-18 (×4): qty 1

## 2021-10-18 MED ORDER — ATORVASTATIN CALCIUM 20 MG PO TABS
40.0000 mg | ORAL_TABLET | Freq: Every day | ORAL | Status: DC
  Administered 2021-10-18 – 2021-10-20 (×3): 40 mg via ORAL
  Filled 2021-10-18 (×3): qty 2

## 2021-10-18 MED ORDER — HYDROMORPHONE HCL 1 MG/ML PO LIQD
0.5000 mg | ORAL | Status: DC | PRN
Start: 2021-10-18 — End: 2021-10-18

## 2021-10-18 MED ORDER — ACETAMINOPHEN 325 MG PO TABS
650.0000 mg | ORAL_TABLET | Freq: Four times a day (QID) | ORAL | Status: DC | PRN
  Administered 2021-10-18: 650 mg via ORAL
  Filled 2021-10-18: qty 2

## 2021-10-18 MED ORDER — SODIUM CHLORIDE 0.9 % IV SOLN
1.0000 g | INTRAVENOUS | Status: DC
  Administered 2021-10-19 – 2021-10-20 (×2): 1 g via INTRAVENOUS
  Filled 2021-10-18: qty 10
  Filled 2021-10-18: qty 1

## 2021-10-18 MED ORDER — PANTOPRAZOLE SODIUM 40 MG PO TBEC: 40.0000 mg | DELAYED_RELEASE_TABLET | Freq: Every day | ORAL | Status: DC

## 2021-10-18 MED ORDER — MELATONIN 5 MG PO TABS: 2.5000 mg | ORAL_TABLET | Freq: Every evening | ORAL | Status: DC | PRN

## 2021-10-18 MED ORDER — ALBUTEROL SULFATE HFA 108 (90 BASE) MCG/ACT IN AERS: 2.0000 | INHALATION_SPRAY | Freq: Four times a day (QID) | RESPIRATORY_TRACT | Status: DC | PRN

## 2021-10-18 MED ORDER — ONDANSETRON HCL 4 MG/2ML IJ SOLN: 4.0000 mg | Freq: Four times a day (QID) | INTRAMUSCULAR | Status: DC | PRN

## 2021-10-18 MED ORDER — TAMSULOSIN HCL 0.4 MG PO CAPS: 0.4000 mg | ORAL_CAPSULE | Freq: Every day | ORAL | Status: DC

## 2021-10-18 MED ORDER — OXYCODONE HCL 5 MG PO TABS
10.0000 mg | ORAL_TABLET | ORAL | Status: DC | PRN
Start: 2021-10-18 — End: 2021-10-21
  Administered 2021-10-18 – 2021-10-21 (×11): 10 mg via ORAL
  Filled 2021-10-18 (×10): qty 2

## 2021-10-18 MED ORDER — SENNOSIDES-DOCUSATE SODIUM 8.6-50 MG PO TABS
1.0000 | ORAL_TABLET | Freq: Every day | ORAL | Status: DC
  Administered 2021-10-18 – 2021-10-20 (×3): 1 via ORAL
  Filled 2021-10-18 (×3): qty 1

## 2021-10-18 MED ORDER — POLYETHYLENE GLYCOL 3350 17 G PO PACK
17.0000 g | PACK | Freq: Every day | ORAL | Status: DC
  Administered 2021-10-18 – 2021-10-21 (×3): 17 g via ORAL
  Filled 2021-10-18 (×3): qty 1

## 2021-10-18 MED ORDER — POLYETHYLENE GLYCOL 3350 17 G PO PACK: 17.0000 g | PACK | Freq: Every day | ORAL | Status: DC

## 2021-10-18 MED ORDER — PANTOPRAZOLE SODIUM 40 MG PO TBEC
40.0000 mg | DELAYED_RELEASE_TABLET | Freq: Every day | ORAL | Status: DC
  Administered 2021-10-18 – 2021-10-21 (×4): 40 mg via ORAL
  Filled 2021-10-18 (×4): qty 1

## 2021-10-18 MED ORDER — CARVEDILOL 3.125 MG PO TABS
3.1250 mg | ORAL_TABLET | Freq: Two times a day (BID) | ORAL | Status: DC
  Administered 2021-10-18 – 2021-10-21 (×5): 3.125 mg via ORAL
  Filled 2021-10-18 (×3): qty 1
  Filled 2021-10-18: qty 0.5
  Filled 2021-10-18 (×2): qty 1

## 2021-10-18 MED ORDER — ALBUTEROL SULFATE (2.5 MG/3ML) 0.083% IN NEBU
2.5000 mg | INHALATION_SOLUTION | Freq: Four times a day (QID) | RESPIRATORY_TRACT | Status: DC | PRN
Start: 2021-10-18 — End: 2021-10-21

## 2021-10-18 MED ORDER — HYDROMORPHONE HCL 1 MG/ML IJ SOLN
1.0000 mg | Freq: Once | INTRAMUSCULAR | Status: AC
  Administered 2021-10-18: 1 mg via INTRAVENOUS
  Filled 2021-10-18: qty 1

## 2021-10-18 MED ORDER — HYDROMORPHONE HCL 1 MG/ML IJ SOLN
1.0000 mg | Freq: Once | INTRAMUSCULAR | Status: AC
Start: 2021-10-18 — End: 2021-10-18
  Administered 2021-10-18: 1 mg via INTRAVENOUS
  Filled 2021-10-18: qty 1

## 2021-10-18 NOTE — NC FL2 (Signed)
?[place] MEDICAID FL2 LEVEL OF CARE SCREENING TOOL  ?  ? ?IDENTIFICATION  ?Patient Name: ?[name] Birthdate: 1956-01-08 Sex: male Admission Date (Current Location): ?10/18/2021  ?[place] and [place] Number: ? [place] ?  Facility and Address:  ?[facility], [address], [place],  [address] ?     Provider Number: ?[id number]  ?Attending Physician Name and Address:  ?Kayleen Memos, DO ? Relative Name and Phone Number:  ?Denyse Amass [phone] ?   ?Current Level of Care: ?Hospital Recommended Level of Care: ?[place] Prior Approval Number: ?  ? ?Date Approved/Denied: ?  PASRR Number: ?[id number] A ? ?Discharge Plan: ?SNF ?  ? ?Current Diagnoses: ?Patient Active Problem List  ? Diagnosis Date Noted  ? Cellulitis 10/18/2021  ? Multiple open wounds of left lower leg 10/02/2021  ? Multiple open wounds of lower leg 10/01/2021  ? Hyperkalemia 08/08/2021  ? Acute hypoxemic respiratory failure ([place]) 08/08/2021  ? Lymphedema associated with obesity 08/07/2021  ? OSA on CPAP 08/07/2021  ? Cirrhosis of liver ([place]) 06/16/2021  ? Esophageal varices ([place]) 06/16/2021  ? Hyperphosphatemia 06/15/2021  ? Persistent atrial fibrillation ([place])   ? HFrEF (heart failure with reduced ejection fraction) ([place])   ? Chest pain 01/05/2021  ? Cough   ? CAD (coronary artery disease) 01/05/2020  ? Chronic anticoagulation 01/05/2020  ? Acute bronchitis 01/05/2020  ? AF (paroxysmal atrial fibrillation) ([place]) 01/05/2020  ? History of substance abuse ([place]) 10/24/2019  ? Elevated troponin 10/24/2019  ? Nonspecific chest pain 10/24/2019  ? Chronic diastolic CHF (congestive heart failure) ([place]) 09/28/2019  ? History of MI (myocardial infarction) 09/28/2019  ? Anemia of chronic kidney failure, stage 5 ([place]) 09/28/2019  ? History of pulmonary embolism 09/28/2019  ? Chronic midline low back pain   ? Acute respiratory failure with hypoxia ([place])   ? Acute diastolic CHF (congestive heart failure) ([place])    ? Anemia of chronic disease   ? Thrombocytopenia ([place])   ? Congestive heart failure ([place])   ? Fluid overload 06/21/2019  ? Volume overload 11/15/2018  ? ESRD (end stage renal disease) on dialysis ([place]) 11/14/2018  ? HCAP (healthcare-associated pneumonia) 08/27/2018  ? SOB (shortness of breath) 08/25/2018  ? Chest pain of uncertain etiology 14/97/0263  ? ESRD on dialysis ([place]) 11/08/2016  ? Prostate cancer screening 11/08/2016  ? Acquired cyst of kidney 11/08/2016  ? Urinary urgency 11/08/2016  ? Acute on chronic renal failure ([place]) 09/23/2016  ? Hypertensive urgency 09/21/2016  ? Dysthymia 02/26/2016  ? Chronic pain 02/26/2016  ? Noncompliance with renal dialysis 02/26/2016  ? Opiate abuse, continuous ([place]) 02/03/2016  ? Substance induced mood disorder ([place]) 02/03/2016  ? Antisocial personality disorder ([place]) 02/03/2016  ? Left-sided weakness 01/18/2016  ? HEPATITIS C 06/28/2008  ? HLD (hyperlipidemia) 06/28/2008  ? SUBSTANCE ABUSE, MULTIPLE 06/28/2008  ? Hypertension 06/27/2008  ? ? ?Orientation RESPIRATION BLADDER Height & Weight   ?  ?Self, Time, Situation, Place ? Normal Continent Weight: 122.5 kg ?Height:  6' (182.9 cm)  ?BEHAVIORAL SYMPTOMS/MOOD NEUROLOGICAL BOWEL NUTRITION STATUS  ?    Continent Diet (see dc summary)  ?AMBULATORY STATUS COMMUNICATION OF NEEDS Skin   ?Extensive Assist Verbally Normal ?  ?  ?  ?    ?     ?     ? ? ?Personal Care Assistance Level of Assistance  ?Bathing, Feeding, Dressing Bathing Assistance: Maximum assistance ?Feeding assistance: Limited assistance ?Dressing Assistance: Maximum assistance ?   ? ?Functional Limitations Info  ?  Sight, Hearing, Speech Sight Info: Adequate ?Hearing Info: Adequate ?Speech Info: Adequate  ? ? ?SPECIAL CARE FACTORS FREQUENCY  ?PT (By licensed PT), OT (By licensed OT)   ?  ?PT Frequency: 5 times per week ?OT Frequency: 5 times per week ?  ?  ?  ?   ? ? ?Contractures Contractures Info: Not present  ? ? ?Additional Factors Info  ?Code Status, Allergies  Code Status Info: Full code ?Allergies Info: Cyclobenzaprine, Amlodipine, Clonidine, Furosemide, Tylenol (Acetaminophen) ?  ?  ?  ?   ? ?Current Medications (10/18/2021):  This is the current hospital active medication list ?Current Facility-Administered Medications  ?Medication Dose Route Frequency Provider Last Rate Last Admin  ? acetaminophen (TYLENOL) tablet 650 mg  650 mg Oral Q6H PRN Irene Pap N, DO   650 mg at 10/18/21 1130  ? albuterol (PROVENTIL) (2.5 MG/3ML) 0.083% nebulizer solution 2.5 mg  2.5 mg Nebulization Q6H PRN Renda Rolls, RPH      ? apixaban (ELIQUIS) tablet 5 mg  5 mg Oral BID Irene Pap N, DO   5 mg at 10/18/21 1130  ? atorvastatin (LIPITOR) tablet 40 mg  40 mg Oral QHS Hall, Carole N, DO      ? carvedilol (COREG) tablet 3.125 mg  3.125 mg Oral BID Irene Pap N, DO      ? [START ON 10/19/2021] ceFEPIme (MAXIPIME) 1 g in sodium chloride 0.9 % 100 mL IVPB  1 g Intravenous Q24H Irene Pap N, DO      ? Chlorhexidine Gluconate Cloth 2 % PADS 6 each  6 each Topical Q0600 Breeze, Benancio Deeds, NP      ? epoetin alfa (EPOGEN) injection 10,000 Units  10,000 Units Intravenous Q M,W,F-HD Colon Flattery, NP      ? HYDROmorphone (DILAUDID) injection 1 mg  1 mg Intravenous Q4H PRN Wyvonnia Dusky, MD   1 mg at 10/18/21 1451  ? melatonin tablet 2.5 mg  2.5 mg Oral QHS PRN Irene Pap N, DO      ? ondansetron [facility]) injection 4 mg  4 mg Intravenous Q6H PRN Irene Pap N, DO      ? oxyCODONE (Oxy IR/ROXICODONE) immediate release tablet 10 mg  10 mg Oral Q4H PRN Wyvonnia Dusky, MD   10 mg at 10/18/21 1342  ? pantoprazole (PROTONIX) EC tablet 40 mg  40 mg Oral Daily Irene Pap N, DO   40 mg at 10/18/21 1130  ? polyethylene glycol (MIRALAX / GLYCOLAX) packet 17 g  17 g Oral Daily PRN Irene Pap N, DO      ? polyethylene glycol (MIRALAX / GLYCOLAX) packet 17 g  17 g Oral Daily Irene Pap N, DO   17 g at 10/18/21 1131  ? pregabalin (LYRICA) capsule 25 mg  25 mg Oral QHS Hall, Carole N,  DO      ? senna-docusate (Senokot-S) tablet 1 tablet  1 tablet Oral QHS Irene Pap N, DO      ? tamsulosin (FLOMAX) capsule 0.4 mg  0.4 mg Oral Daily [place], Carole N, DO   0.4 mg at 10/18/21 1130  ? vancomycin (VANCOCIN) IVPB 1000 mg/200 mL premix  1,000 mg Intravenous Q M,W,F-HD Renda Rolls, RPH 200 mL/hr at 10/18/21 1159 1,000 mg at 10/18/21 1159  ? ? ? ?Discharge Medications: ?Please see discharge summary for a list of discharge medications. ? ?Relevant Imaging Results: ? ?Relevant Lab Results: ? ? ?Additional Information ?SS#: [id number]. HD [place] MWF 11:00am. ? ?  Conception Oms, RN ? ? ? ? ?

## 2021-10-18 NOTE — ED Notes (Signed)
Petroleum guaze dressing applied to pt's lower right leg at this time.  ?

## 2021-10-18 NOTE — ED Provider Notes (Signed)
? ?[facility] ?Provider Note ? ? ? Event Date/Time  ? First MD Initiated Contact with Patient 10/18/21 0300   ?  (approximate) ? ? ?History  ? ?Chief Complaint ?Leg Pain ? ? ?HPI ? ?[name] is a 66 y.o. male with past medical history of hypertension, hyperlipidemia, atrial fibrillation on Eliquis, ESRD on HD, and chronic pain syndrome who presents to the ED complaining of leg pain.  Patient reports that he has been dealing with increasing pain and swelling in his legs, right greater than left, for the past 2 days.  He states that the right leg is particularly painful and he has begun to notice pus leaking through dressing to his right leg.  He denies any fevers and has not had any recent trauma to the leg, states he has not missed any doses of his Eliquis.  He last received dialysis 2 days ago, denies missing any recent treatments.  Patient was recently admitted to the hospital for leg infection requiring debridement with vascular surgery, subsequently completed course of Bactrim following discharge. ?  ? ? ?Physical Exam  ? ?Triage Vital Signs: ?ED Triage Vitals  ?Enc Vitals Group  ?   BP   ?   Pulse   ?   Resp   ?   Temp   ?   Temp src   ?   SpO2   ?   Weight   ?   Height   ?   Head Circumference   ?   Peak Flow   ?   Pain Score   ?   Pain Loc   ?   Pain Edu?   ?   Excl. in [place]?   ? ? ?Most recent vital signs: ?Vitals:  ? 10/18/21 0312 10/18/21 0330  ?BP:  (!) 158/98  ?Pulse: 77 96  ?Resp: (!) 24 17  ?Temp: 97.9 ?F (36.6 ?C)   ?SpO2: 96%   ? ? ?Constitutional: Alert and oriented. ?Eyes: Conjunctivae are normal. ?Head: Atraumatic. ?Nose: No congestion/rhinnorhea. ?Mouth/Throat: Mucous membranes are moist.  ?Cardiovascular: Normal rate, regular rhythm. Grossly normal heart sounds.  2+ radial and DP pulses bilaterally.  Left upper extremity AV fistula with palpable thrill. ?Respiratory: Normal respiratory effort.  No retractions. Lungs CTAB. ?Gastrointestinal: Soft and nontender. No  distention. ?Musculoskeletal: Unna boots in place to bilateral lower extremities.  Purulent drainage leaking through lower portion of Unna boot on right, significant purulence noted with removal of Unna boot.  2+ pitting edema to knees bilaterally with diffuse tenderness, no focal fluctuance noted. ?Neurologic:  Normal speech and language. No gross focal neurologic deficits are appreciated. ? ? ? ?ED Results / Procedures / Treatments  ? ?Labs ?(all labs ordered are listed, but only abnormal results are displayed) ?Labs Reviewed  ?CBC WITH DIFFERENTIAL/PLATELET - Abnormal; Notable for the following components:  ?    Result Value  ? RBC 3.09 (*)   ? Hemoglobin 8.7 (*)   ? HCT 27.3 (*)   ? RDW 16.5 (*)   ? Platelets 136 (*)   ? Monocytes Absolute 1.2 (*)   ? All other components within normal limits  ?COMPREHENSIVE METABOLIC PANEL - Abnormal; Notable for the following components:  ? Chloride 96 (*)   ? BUN 61 (*)   ? Creatinine, Ser 8.53 (*)   ? Calcium 8.7 (*)   ? Albumin 2.6 (*)   ? GFR, Estimated 6 (*)   ? All other components within normal limits  ? ? ?  RADIOLOGY ?Right tib-fib x-ray reviewed by me with no fracture, dislocation, or subcutaneous gas. ? ?PROCEDURES: ? ?Critical Care performed: No ? ?Procedures ? ? ?MEDICATIONS ORDERED IN ED: ?Medications  ?vancomycin (VANCOCIN) IVPB 1000 mg/200 mL premix (1,000 mg Intravenous New Bag/Given 10/18/21 0420)  ?oxyCODONE (Oxy IR/ROXICODONE) immediate release tablet 10 mg (10 mg Oral Given 10/18/21 0321)  ?ceFEPIme (MAXIPIME) 1 g in sodium chloride 0.9 % 100 mL IVPB (1 g Intravenous New Bag/Given 10/18/21 0334)  ?HYDROmorphone (DILAUDID) injection 1 mg (1 mg Intravenous Given 10/18/21 0355)  ? ? ? ?IMPRESSION / MDM / ASSESSMENT AND PLAN / ED COURSE  ?I reviewed the triage vital signs and the nursing notes. ?             ?               ? ?66 y.o. male with past medical history of hypertension, hyperlipidemia, atrial fibrillation on Eliquis, ESRD on HD (MWF), and chronic pain  syndrome who presents to the ED complaining of worsening pain in his legs, particularly the right lower extremity where he has noticed pus leaking through his dressing. ? ?Differential diagnosis includes, but is not limited to, abscess, cellulitis, sepsis, hyperkalemia, DVT. ? ?Patient nontoxic-appearing in no acute distress, complains of significant pain to his lower extremities but is neurovascular intact.  He does have significant purulence to his right lower extremity and overall appearance is concerning for recurrent cellulitis.  No focal fluctuance to suggest abscess, we will further assess with x-ray for signs of osteomyelitis or soft tissue gas.  We will start patient on broad-spectrum antibiotics and treat pain with oral oxycodone that he was previously receiving in the hospital.  He will likely require admission for recurrent cellulitis. ? ?X-ray of his right lower leg is unremarkable with no obvious signs of osteomyelitis or subcutaneous gas.  Labs are also reassuring with CBC showing stable anemia, BMP consistent with known ESRD, LFTs within normal limits.  Patient currently receiving broad-spectrum antibiotics, case discussed with hospitalist for admission. ? ?  ? ? ?FINAL CLINICAL IMPRESSION(S) / ED DIAGNOSES  ? ?Final diagnoses:  ?Cellulitis of right lower extremity  ?ESRD on dialysis [facility])  ? ? ? ?Rx / DC Orders  ? ?ED Discharge Orders   ? ? None  ? ?  ? ? ? ?Note:  This document was prepared using Dragon voice recognition software and may include unintentional dictation errors. ?  ?Blake Divine, MD ?10/18/21 0422 ? ?

## 2021-10-18 NOTE — Progress Notes (Signed)
Hemodialysis Post Treatment Note: ?  ?Treatment date: 10/18/2021 ?  ?Treatment time:  3.5 hours  ?  ?Access:  Left AVF ?  ?UF Removed: 2036 ml ?  ?Next Treatment: 10/20/21 ? ?Patient started dialysis as ordered. Patient continue with pain to bilateral legs, patient medicated for pain during treatment. Patient had an uneventful treatment. Patient tolerated treatment well, no adverse effects noted or reported. Patient hemodynamically stable, report given to floor nurse Myrlene Broker, RN. ?

## 2021-10-18 NOTE — Progress Notes (Signed)
PT Cancellation Note ? ?Patient Details ?Name: [name] ?MRN: [id number] ?DOB: 08-08-1955 ? ? ?Cancelled Treatment:    Reason Eval/Treat Not Completed: Fatigue/lethargy limiting ability to participate (Consult received and chart reviewed.  Patient just finishing OT session and settling back into bed; requests therapist allow rest/recovery period and re-attempt in PM.) ? ?Kainen Struckman H. Owens Shark, PT, DPT, NCS ?10/18/21, 12:03 PM ?[phone] ? ?

## 2021-10-18 NOTE — Consult Note (Signed)
WOC Nurse Consult Note: ?Reason for Consult:Patient known to our service, last seen last week by my associate M. Austin on 10/12/21. Please see note from that encounter . Patient is followed by Dr. Lucky Cowboy, Vascular Surgeon. ?Wound type: venous insufficiency, lymphedema ?Pressure Injury POA: N/A ?Measurement: See note from 10/12/21  ?Wound bed: See photos taken in ED yesterday ?Drainage (amount, consistency, odor) moderate serous to light yellow ?Periwound:edematous ?Dressing procedure/placement/frequency: Will implement daily dressing changes using a silver hydrofiber (Aquacel Ag+ Advantage covered with ABD pads for antimicrobial donation and absorption, will secure with dry boot (Kerlix and ACE) over Unna's boot at this time and change daily.  ?A set of pressure redistribution heel boots are provided as well as a pressure redistribution chair cushion for pressure injury prevention when OOB, as the patient sleeps in a recliner chair ? ?[facility] nursing team will not follow, but will remain available to this patient, the nursing and medical teams.  Please re-consult if needed. ?Thanks, ?Maudie Flakes, MSN, RN, [place], [place], CWON-AP, [place]  ?Pager# [phone]  ? ? ? ?  ?

## 2021-10-18 NOTE — Evaluation (Signed)
Occupational Therapy Evaluation ?Patient Details ?Name: [name] ?MRN: [id number] ?DOB: February 10, 1956 ?Today's Date: 10/18/2021 ? ? ?History of Present Illness 66 y.o. male with medical history significant for ESRD on HD MWF, hypertension, hyperlipidemia, persistent atrial fibrillation on Eliquis, GERD, BPH, chronic pain syndrome, who presents to [facility] ED with complaints of right lower extremity pain and wound with purulent drainage for the past 2 days. Patient was recently admitted on 10/02/2021 for bilateral lower extremity ulcers and abscesses s/p I&D on 3/30, received IV antibiotics therapy and was discharged to SNF on 10/14/2021.  Patient was brought to [facility] in via EMS from [facility].  In the ED, pt found to have significant skin breakdown involving right lower extremity.  ? ?Clinical Impression ?  ?Pt seen for OT evaluation this date. Prior to admission, pt was at SNF following recent hospitalization (this Chief Strategy Officer familiar with pt from previous admission). Pt A&Ox4 however was unable to verbalize what his OT tx sessions at SNF focused on. Pt currently presents with b/l LE pain, decreased balance, and decreased strength. Due to these current functional impairments, pt requires MAX A to don socks at bed-level, MIN GUARD for bed mobility, MOD A for sit>stand transfers, MIN GUARD to pull shorts over hips in standing, and MIN GUARD for marching 10x and taking lateral steps at EOB with RW. Pt declined to transfer to recliner this date. Pt would benefit from additional skilled OT services to maximize return to PLOF and minimize risk of future falls, injury, caregiver burden, and readmission. Upon discharge, recommend SNF.   ? ?Recommendations for follow up therapy are one component of a multi-disciplinary discharge planning process, led by the attending physician.  Recommendations may be updated based on patient status, additional functional criteria and insurance authorization.  ? ?Follow Up Recommendations ?  Skilled nursing-short term rehab (<3 hours/day)  ?  ?Assistance Recommended at Discharge Intermittent Supervision/Assistance  ?Patient can return home with the following A lot of help with walking and/or transfers;A lot of help with bathing/dressing/bathroom;Assistance with cooking/housework;Help with stairs or ramp for entrance ? ?  ?Functional Status Assessment ? Patient has had a recent decline in their functional status and demonstrates the ability to make significant improvements in function in a reasonable and predictable amount of time.  ?Equipment Recommendations ? Other (comment) (defer to next venue of care)  ?  ?   ?Precautions / Restrictions Precautions ?Precautions: Fall ?Restrictions ?Weight Bearing Restrictions: No  ? ?  ? ?Mobility Bed Mobility ?Overal bed mobility: Needs Assistance ?Bed Mobility: Supine to Sit, Sit to Supine ?  ?  ?Supine to sit: Min guard, HOB elevated ?Sit to supine: Min guard ?  ?General bed mobility comments: With extensive use of bedrails, pt required no physical assist for supine<>sit transfer ?  ? ?Transfers ?Overall transfer level: Needs assistance ?Equipment used: Rolling walker (2 wheels) ?Transfers: Sit to/from Stand ?Sit to Stand: Mod assist, From elevated surface ?  ?  ?  ?  ?  ?General transfer comment: Requires MOD A to clear hips from EOB ?  ? ?  ?Balance Overall balance assessment: Needs assistance ?Sitting-balance support: No upper extremity supported, Feet supported ?Sitting balance-Leahy Scale: Fair ?Sitting balance - Comments: Able to maintain static sitting balance without support ?  ?Standing balance support: Bilateral upper extremity supported, During functional activity, Reliant on assistive device for balance ?Standing balance-Leahy Scale: Fair ?Standing balance comment: Able to take lateral steps at EOB with MIN GUARD ?  ?  ?  ?  ?  ?  ?  ?  ?  ?  ?  ?   ? ?  ADL either performed or assessed with clinical judgement  ? ?ADL Overall ADL's : Needs  assistance/impaired ?  ?  ?  ?  ?  ?  ?  ?  ?  ?  ?Lower Body Dressing: Maximal assistance ?Lower Body Dressing Details (indicate cue type and reason): Requires MAX A to don/doff socks at bed-level and MIN GUARD to pull shorts over hips in standing position ?  ?  ?  ?  ?  ?  ?  ?   ? ? ? ?Vision Ability to See in Adequate Light: 0 Adequate ?Patient Visual Report: No change from baseline ?   ?   ?   ?   ? ?Pertinent Vitals/Pain Pain Assessment ?Pain Assessment: Faces ?Faces Pain Scale: Hurts whole lot ?Pain Location: BLE ?Pain Descriptors / Indicators: Aching, Constant, Discomfort, Grimacing, Moaning, Sharp ?Pain Intervention(s): Premedicated before session, Monitored during session, Repositioned, RN gave pain meds during session  ? ? ? ?   ?Extremity/Trunk Assessment Upper Extremity Assessment ?Upper Extremity Assessment: Overall WFL for tasks assessed ?  ?Lower Extremity Assessment ?Lower Extremity Assessment: Generalized weakness;Defer to PT evaluation ?  ?  ?  ?Communication Communication ?Communication: No difficulties ?  ?Cognition Arousal/Alertness: Awake/alert ?Behavior During Therapy: [facility] for tasks assessed/performed ?Overall Cognitive Status: Within Functional Limits for tasks assessed ?  ?  ?  ?  ?  ?  ?  ?  ?  ?  ?  ?  ?  ?  ?  ?  ?General Comments: A&Ox4 ?  ?  ?   ?   ?   ? ? ?Home Living Family/patient expects to be discharged to:: Private residence ?Living Arrangements: Parent (Lives with Mom & Dad who are in late 80s/early 34s) ?Available Help at Discharge: Family;Available PRN/intermittently (unable to provide physical assist) ?Type of Home: House ?Home Access: Ramped entrance ?  ?  ?Home Layout: One level ?  ?  ?Bathroom Shower/Tub: Walk-in shower ?  ?Bathroom Toilet: Standard ?  ?  ?Home Equipment: Rollator (4 wheels);Grab bars - tub/shower;Shower seat ?  ?  ?  ? ?  ?Prior Functioning/Environment Prior Level of Function : Needs assist ?  ?  ?  ?  ?  ?  ?Mobility Comments: pt is a questionable  historian. Reports that he uses a RW for functional mobility, but later on reports that he cannot recall last time he stood up ?ADLs Comments: pt is a questionable historian. Reports that he is indepedent with ADLs and shower transfers ?  ? ?  ?  ?OT Problem List: Decreased strength;Decreased activity tolerance;Impaired balance (sitting and/or standing);Pain ?  ?   ?OT Treatment/Interventions: Self-care/ADL training;Therapeutic exercise;DME and/or AE instruction;Therapeutic activities;Patient/family education;Balance training  ?  ?OT Goals(Current goals can be found in the care plan section) Acute Rehab OT Goals ?Patient Stated Goal: to be independent with self-care ?OT Goal Formulation: With patient ?Time For Goal Achievement: 11/01/21 ?Potential to Achieve Goals: Fair ?ADL Goals ?Pt Will Perform Grooming: with supervision;standing ?Pt Will Perform Lower Body Dressing: with mod assist;sit to/from stand;with adaptive equipment ?Pt Will Transfer to Toilet: with min guard assist;ambulating;bedside commode  ?OT Frequency: Min 2X/week ?  ? ?AM-PAC OT "6 Clicks" Daily Activity     ?Outcome Measure Help from another person eating meals?: None ?Help from another person taking care of personal grooming?: A Little ?Help from another person toileting, which includes using toliet, bedpan, or urinal?: A Lot ?Help from another person bathing (including washing, rinsing, drying)?: A Lot ?Help from  another person to put on and taking off regular upper body clothing?: A Little ?Help from another person to put on and taking off regular lower body clothing?: A Lot ?6 Click Score: 16 ?  ?End of Session Equipment Utilized During Treatment: Rolling walker (2 wheels) ?Nurse Communication: Mobility status ? ?Activity Tolerance: Patient tolerated treatment well ?Patient left: in bed;with call bell/phone within reach;with bed alarm set;with nursing/sitter in room ? ?OT Visit Diagnosis: Unsteadiness on feet (R26.81);Muscle weakness  (generalized) (M62.81);Pain ?Pain - Right/Left: Right ?Pain - part of body: Leg  ?              ?Time: 7915-0569 ?OT Time Calculation (min): 26 min ?Charges:  OT General Charges ?$OT Visit: 1 Visit ?OT Evaluation ?$OT Eval Modera

## 2021-10-18 NOTE — Progress Notes (Signed)
Pharmacy Antibiotic Note ? ?HOLDAN [name] is a 66 y.o. male ESRD pt on MWF HD admitted on 10/18/2021 with wound infection / cellulitis.  Pharmacy has been consulted for Cefepime and Vancomycin dosing. ? ?Plan: ?Cefepime 1 gm q24h per indication & ESRD HD status. ? ?Pt given initials dose of Vancomycin 2500 mg, followed by Vanc 1000 mg on dialysis days. ? ?Height: 6' (182.9 cm) ?Weight: 122.5 kg (270 lb) ?IBW/kg (Calculated) : 77.6 ? ?Temp (24hrs), Avg:97.9 ?F (36.6 ?C), Min:97.9 ?F (36.6 ?C), Max:97.9 ?F (36.6 ?C) ? ?Recent Labs  ?Lab 10/11/21 ?1540 10/14/21 ?0867 10/18/21 ?6195  ?WBC 8.8 8.8 8.1  ?CREATININE 7.85* 5.53* 8.53*  ?  ?Estimated Creatinine Clearance: 12 mL/min (A) (by C-G formula based on SCr of 8.53 mg/dL (H)).   ? ?Allergies  ?Allergen Reactions  ? Cyclobenzaprine Nausea Only  ? Amlodipine   ? Clonidine Other (See Comments)  ?  Asymptomatic bradycardia to 38  ? Furosemide Other (See Comments)  ?  Caused renal failure  ? Tylenol [Acetaminophen] Other (See Comments)  ?  Reaction:  Pt states it bothers his liver  ? ? ?Antimicrobials this admission: ?4/10 Cefepime >>  ?4/10 Vancomycin >>  ? ?Microbiology results: ?4/10 BCx: Pending ? ?Thank you for allowing pharmacy to be a part of this patientNicholass care. ? ?Renda Rolls, PharmD, MBA ?10/18/2021 ?5:16 AM ? ? ?

## 2021-10-18 NOTE — H&P (Signed)
?History and Physical ? ?[name] UEA:[id number] DOB: [birth year] DOA: 10/18/2021 ? ?Referring physician: Dr. Charna Archer, [place]  ?PCP: [facility] [place]  ?Outpatient Specialists: Nephrology, vascular surgery, wound care with ?Patient coming from: SNF. ? ?Chief Complaint: Right lower extremity wound with purulence. ? ?HPI: [name] is a 66 y.o. male with medical history significant for ESRD on HD MWF, hypertension, hyperlipidemia, persistent atrial fibrillation on Eliquis, GERD, BPH, chronic pain syndrome, who presents to [facility] ED with complaints of right lower extremity pain and wound with purulent drainage for the past 2 days.  The patient has Unna boots on both lower extremities and states that the right lower extremity began to leak purulent fluid through dressing.  Patient was recently admitted on 10/02/2021 for bilateral lower extremity ulcers and abscesses s/p I&D on 3/30, received IV antibiotics therapy and was discharged to SNF on 10/14/2021 with prescription for Bactrim with end date 10/16/21.  Patient was brought in via EMS from [facility].  In the ED found to have significant skin breakdown involving right lower extremity.  Received cefepime and IV vancomycin in the ED.  TRH, hospitalist team, asked to admit. ? ?ED Course: Temperature 97.9.  BP 147/89, pulse 84, respiration rate 27, saturation 93% on room air.  Lab studies significant for hemoglobin 8.7, MCV 88, platelet count 136.  Last MRSA screening test positive on 10/02/2021. ? ?Review of Systems: ?Review of systems as noted in the HPI. All other systems reviewed and are negative. ? ? ?Past Medical History:  ?Diagnosis Date  ? Depression   ? Diastolic dysfunction   ? a. 09/2019 Echo: EF 55-60%, no rwma, mod LVH, gr2 DD. Nl RV size/fxn. Mildly dil LA. Ao root 4.5cm.  ? Dilated aortic root ([place])   ? a. 09/2019 Echo: Ao root 4.5cm.  ? Elevated troponin level not due myocardial infarction   ? ESRD (end stage renal disease) ([place])   ?  Hypertension   ? Nonobstructive Coronary Artery Disease   ? a. 09/2019 Cath: LM nl, LAD min irregs, D1 nl, D2 min irregs, LCX large, min irregs, RCA large, 20p, 30p/m.  ? PE (pulmonary embolism)   ? Renal insufficiency   ? ?Past Surgical History:  ?Procedure Laterality Date  ? IRRIGATION AND DEBRIDEMENT ABSCESS Bilateral 10/07/2021  ? Procedure: IRRIGATION AND DEBRIDEMENT ABSCESS BILATERAL LOWER EXTREMITIES;  Surgeon: Algernon Huxley, MD;  Location: [facility] ORS;  Service: Vascular;  Laterality: Bilateral;  ? LEFT HEART CATH AND CORONARY ANGIOGRAPHY N/A 09/30/2019  ? Procedure: LEFT HEART CATH AND CORONARY ANGIOGRAPHY;  Surgeon: Wellington Hampshire, MD;  Location: [facility];  Service: Cardiovascular;  Laterality: N/A;  ? TOTAL HIP ARTHROPLASTY    ? ? ?Social History:  reports that he has been smoking cigarettes. He has been smoking an average of .5 packs per day. He has never used smokeless tobacco. He reports that he does not drink alcohol and does not use drugs. ? ? ?Allergies  ?Allergen Reactions  ? Cyclobenzaprine Nausea Only  ? Amlodipine   ? Clonidine Other (See Comments)  ?  Asymptomatic bradycardia to 38  ? Furosemide Other (See Comments)  ?  Caused renal failure  ? Tylenol [Acetaminophen] Other (See Comments)  ?  Reaction:  Pt states it bothers his liver  ? ? ?Family History  ?Problem Relation Age of Onset  ? Hypertension Mother   ?  ? ? ?Prior to Admission medications   ?Medication Sig Start Date End Date Taking? Authorizing Provider  ?  albuterol (VENTOLIN HFA) 108 (90 Base) MCG/ACT inhaler Inhale 2 puffs into the lungs every 6 (six) hours as needed for wheezing or shortness of breath. 01/16/20   Blake Divine, MD  ?apixaban (ELIQUIS) 5 MG TABS tablet Take 1 tablet (5 mg total) by mouth 2 (two) times daily. 01/07/21 06/15/21  Wyvonnia Dusky, MD  ?atorvastatin (LIPITOR) 40 MG tablet Take 1 tablet (40 mg total) by mouth at bedtime. 08/10/21 10/01/21  Fritzi Mandes, MD  ?bumetanide (BUMEX) 2 MG tablet Take 1  tablet (2 mg total) by mouth daily. 10/14/21   Sidney Ace, MD  ?carvedilol (COREG) 6.25 MG tablet Take 6.25 mg by mouth 2 (two) times daily. 10/12/20   [provider]  ?diclofenac Sodium (VOLTAREN) 1 % GEL Apply 2 g topically 4 (four) times daily.    [provider]  ?naloxone Karma Greaser) nasal spray 4 mg/0.1 mL SMARTSIG:Both Nares 09/22/21   [provider]  ?omeprazole (PRILOSEC) 40 MG capsule Take 40 mg by mouth 2 (two) times daily.    [provider]  ?ondansetron (ZOFRAN) 8 MG tablet TAKE ONE-HALF TABLET BY MOUTH EVERY 8 HOURS AS NEEDED NAUSEA 08/04/21   [provider]  ?polyethylene glycol (MIRALAX / GLYCOLAX) 17 g packet Take 17 g by mouth daily.    [provider]  ?pregabalin (LYRICA) 25 MG capsule Take 25 mg by mouth at bedtime. 09/22/21   [provider]  ?tamsulosin (FLOMAX) 0.4 MG CAPS capsule Take 0.4 mg by mouth daily. 10/13/20   [provider]  ? ? ?Physical Exam: ?BP (!) 158/98   Pulse 96   Temp 97.9 ?F (36.6 ?C) (Oral)   Resp 17   Ht 6' (1.829 m)   Wt 122.5 kg   SpO2 96%   BMI 36.62 kg/m?  ? ?General: 66 y.o. year-old male well developed well nourished in no acute distress.  Alert and oriented x3. ?Cardiovascular: Irregular rate and rhythm with no rubs or gallops.  No thyromegaly or JVD noted.  2+ pitting edema in lower extremities bilaterally.   ?Respiratory: Clear to auscultation with no wheezes or rales. Good inspiratory effort. ?Abdomen: Soft nontender nondistended with normal bowel sounds x4 quadrants. ?Muskuloskeletal: No cyanosis or clubbing.  2+ pitting edema in lower extremities bilaterally.   ?Neuro: CN II-XII intact, strength, sensation, reflexes ?Skin: Bilateral lower extremity Unna boots, right lower extremity with significant skin breakdown after removing Unna boots. ?Psychiatry: Judgement and insight appear normal. Mood is appropriate for condition and setting ?   ?   ?   ?Labs on Admission:  ?Basic  Metabolic Panel: ?Recent Labs  ?Lab 10/11/21 ?4580 10/14/21 ?9983 10/18/21 ?3825  ?NA 134* 135 135  ?K 4.5 3.9 4.9  ?CL 95* 95* 96*  ?CO2 26 28 27   ?GLUCOSE 102* 96 98  ?BUN 64* 36* 61*  ?CREATININE 7.85* 5.53* 8.53*  ?CALCIUM 8.4* 8.4* 8.7*  ?PHOS 6.0*  --   --   ? ?Liver Function Tests: ?Recent Labs  ?Lab 10/11/21 ?0539 10/14/21 ?7673 10/18/21 ?4193  ?AST  --  20 33  ?ALT  --  17 28  ?ALKPHOS  --  82 120  ?BILITOT  --  0.7 1.0  ?PROT  --  7.2 7.7  ?ALBUMIN 2.6* 2.6* 2.6*  ? ?No results for input(s): LIPASE, AMYLASE in the last 168 hours. ?No results for input(s): AMMONIA in the last 168 hours. ?CBC: ?Recent Labs  ?Lab 10/11/21 ?7902 10/14/21 ?4097 10/18/21 ?3532  ?WBC 8.8 8.8 8.1  ?NEUTROABS  --  5.5 5.6  ?HGB 8.5* 8.8* 8.7*  ?HCT 26.4* 27.1* 27.3*  ?MCV 88.3 87.4 88.3  ?PLT 154 153 136*  ? ?Cardiac Enzymes: ?No results for input(s): CKTOTAL, CKMB, CKMBINDEX, TROPONINI in the last 168 hours. ? ?BNP (last 3 results) ?Recent Labs  ?  01/05/21 ?0234 06/15/21 ?2012  ?BNP 4,499.0* >4,500.0*  ? ? ?ProBNP (last 3 results) ?No results for input(s): PROBNP in the last 8760 hours. ? ?CBG: ?No results for input(s): GLUCAP in the last 168 hours. ? ?Radiological Exams on Admission: ?DG Tibia/Fibula Right ? ?Result Date: 10/18/2021 ?CLINICAL DATA:  Right lower extremity infection, initial encounter EXAM: RIGHT TIBIA AND FIBULA - 2 VIEW COMPARISON:  10/01/2021 FINDINGS: No acute fracture or dislocation is noted. Generalized soft tissue swelling is seen consistent with the given clinical history of underlying infection. No bony erosive changes are seen. IMPRESSION: Soft tissue swelling consistent with the given clinical history. No bony abnormality is noted. Electronically Signed   By: Inez Catalina M.D.   On: 10/18/2021 03:55   ? ?EKG: I independently viewed the EKG done and my findings are as followed: Atrial fibrillation rate of 82, nonspecific ST-T changes.  QTc 461. ? ?Assessment/Plan ?Present on Admission: ?  Cellulitis ? ?Principal Problem: ?  Cellulitis ? ?Right lower extremity cellulitis ?Presented with right lower extremity pain, self-reported purulent drainage ?Extensive skin breakdown ?Positive MRSA screening test on 09/2521. ?Wound care spe

## 2021-10-18 NOTE — ED Triage Notes (Signed)
Pt coming from [facility] for lower leg pain. Worse on the right. Legs are extensively wrapped in wound dressings. Pt lower right leg exposed and reveals significant skin breakdown. ?

## 2021-10-18 NOTE — Progress Notes (Signed)
[facility] ?[place], [place] ?10/18/21 ? ?Subjective:  ? ?Hospital day # 0 ? ?[name] 66 year old African-American male with past medical history including persistent atrial fibrillation on Eliquis, hypertension, hyperlipidemia, GERD, BPH, and end-stage renal disease on hemodialysis.  Patient presents to the emergency department from [facility] with complaints of right lower extremity pain and purulent drainage for 2 days.  Patient has been admitted for Cellulitis [L03.90] ?ESRD on dialysis ([name]) [N18.6, Z99.2] ?Cellulitis of right lower extremity [L03.115] ? ?Patient is known to our practice and receives outpatient dialysis treatments at this [facility] on a Monday Wednesday Friday schedule.  Patient's last dialysis treatment received inpatient on Friday, prior to discharge to rehab.  Patient states pain not well managed at rehab and would prevent him from participating.  Denies nausea, vomiting, and diarrhea.  Denies shortness of breath.  Denies chest or abdominal pain.  Patient states drainage began Saturday.  Patient was discharged on oral Bactrim. ? ?Labs on ED arrival include BUN 61, creatinine 8.53 with GFR 6, calcium 8.7, albumin 2.6, and platelets 136.  Blood cultures pending.  Right tib-fib x-ray shows soft tissue swelling consistent with clinical history. ? ?We have been consulted to manage dialysis needs during this admission. ? ? ?Objective:  ?Vital signs in last 24 hours:  ?Temp:  [97.9 ?F (36.6 ?C)-98.3 ?F (36.8 ?C)] 98.3 ?F (36.8 ?C) (04/10 3875) ?Pulse Rate:  [70-96] 75 (04/10 0925) ?Resp:  [16-24] 16 (04/10 0925) ?BP: (101-158)/(52-98) 127/70 (04/10 0925) ?SpO2:  [90 %-100 %] 100 % (04/10 0925) ?Weight:  [122.5 kg] 122.5 kg (04/10 0314) ? ?Weight change:  ?Filed Weights  ? 10/18/21 0314  ?Weight: 122.5 kg  ? ? ?Intake/Output: ?  ? ?Intake/Output Summary (Last 24 hours) at 10/18/2021 1201 ?Last data filed at 10/18/2021 0545 ?Gross per 24 hour  ?Intake  298.52 ml  ?Output --  ?Net 298.52 ml  ? ? ? ? ?Physical Exam: ?General: NAD  ?HEENT Anicteric, moist oral mucous membranes  ?Pulm/lungs Normal breathing effort on room air  ?CVS/Heart No rub or gallop, irregular  ?Abdomen:  Soft  ?Extremities: 2+  pitting edema bilateral lower extremities, Bilat LE gauze dressings  ?Neurologic: Alert oriented, moving all 4 extremities  ?Skin: Bilateral lower extremity wounds  ?Access: Left upper arm AV fistula  ?   ? ? ?Basic Metabolic Panel: ? ?Recent Labs  ?Lab 10/14/21 ?6433 10/18/21 ?0313 10/18/21 ?2951 10/18/21 ?8841  ?NA 135 135  --  136  ?K 3.9 4.9  --  5.0  ?CL 95* 96*  --  96*  ?CO2 28 27  --  26  ?GLUCOSE 96 98  --  107*  ?BUN 36* 61*  --  64*  ?CREATININE 5.53* 8.53*  --  8.74*  ?CALCIUM 8.4* 8.7*  --  8.5*  ?MG  --   --  2.1  --   ?PHOS  --   --  6.5*  --   ? ? ? ? ?CBC: ?Recent Labs  ?Lab 10/14/21 ?6606 10/18/21 ?0313 10/18/21 ?3016  ?WBC 8.8 8.1 8.1  ?NEUTROABS 5.5 5.6 5.9  ?HGB 8.8* 8.7* 8.2*  ?HCT 27.1* 27.3* 25.4*  ?MCV 87.4 88.3 88.8  ?PLT 153 136* 126*  ? ? ? ?  ?Lab Results  ?Component Value Date  ? HEPBSAG NON REACTIVE 10/04/2021  ? HEPBSAB Reactive (A) 10/04/2021  ? HEPBIGM Negative 11/15/2018  ? ? ? ? ?Microbiology: ? ?No results found for this or any previous visit (from the past  240 hour(s)). ? ? ?Coagulation Studies: ?No results for input(s): LABPROT, INR in the last 72 hours. ? ?Urinalysis: ?No results for input(s): COLORURINE, LABSPEC, [place], GLUCOSEU, HGBUR, BILIRUBINUR, KETONESUR, PROTEINUR, UROBILINOGEN, NITRITE, LEUKOCYTESUR in the last 72 hours. ? ?Invalid input(s): APPERANCEUR  ? ? ?Imaging: ?DG Tibia/Fibula Right ? ?Result Date: 10/18/2021 ?CLINICAL DATA:  Right lower extremity infection, initial encounter EXAM: RIGHT TIBIA AND FIBULA - 2 VIEW COMPARISON:  10/01/2021 FINDINGS: No acute fracture or dislocation is noted. Generalized soft tissue swelling is seen consistent with the given clinical history of underlying infection. No bony erosive  changes are seen. IMPRESSION: Soft tissue swelling consistent with the given clinical history. No bony abnormality is noted. Electronically Signed   By: Inez Catalina M.D.   On: 10/18/2021 03:55   ? ? ?Medications:  ? ? [START ON 10/19/2021] ceFEPime (MAXIPIME) IV    ? vancomycin 1,000 mg (10/18/21 1159)  ? ? apixaban  5 mg Oral BID  ? atorvastatin  40 mg Oral QHS  ? carvedilol  3.125 mg Oral BID  ? Chlorhexidine Gluconate Cloth  6 each Topical Q0600  ? pantoprazole  40 mg Oral Daily  ? polyethylene glycol  17 g Oral Daily  ? pregabalin  25 mg Oral QHS  ? senna-docusate  1 tablet Oral QHS  ? tamsulosin  0.4 mg Oral Daily  ? ?acetaminophen, albuterol, HYDROmorphone (DILAUDID) injection, melatonin, ondansetron (ZOFRAN) IV, oxyCODONE, polyethylene glycol ? ?Assessment/ Plan:  ?66 y.o. male with end-stage renal disease, hypertension, atrial fibrillation, coronary artery disease, pulmonary embolism, hyperlipidemia, polysubstance abuse, history of chronic pain syndrome ?Admission at [facility] for acute respiratory failure, COVID-19  in January 2023 ?This time he is admitted on 10/18/2021 for ?Cellulitis [L03.90] ?ESRD on dialysis ([name]) [N18.6, Z99.2] ?Cellulitis of right lower extremity [L03.115] ? ?Outpatient hemodialysis: [facility] dialysis.  240 minutes.  Left arm AV fistula.  15-gauge needles.  3 times per week.  MWF.  Target weight 120 kg. ? ?#End-stage renal disease with LE edema ?Last dialysis received on Friday prior to discharge.  Plan to dialyze patient today to maintain outpatient schedule.  UF goal 2 L as tolerated.  Next treatment scheduled for Wednesday. ? ? ?#Anemia of chronic kidney disease ?Lab Results  ?Component Value Date  ? HGB 8.2 (L) 10/18/2021  ?Mircera received outpatient ?Hemoglobin below desired target, 8.2.  Will continue EPO with treatments ? ?#Secondary hyperparathyroidism ?Lab Results  ?Component Value Date  ? PTH 758 (H) 08/15/2018  ? CALCIUM 8.5 (L) 10/18/2021  ? PHOS 6.5 (H)  10/18/2021  ?Calcium remains within acceptable range however phosphorus remains elevated.  We will consider binders ? ?#Right lower extremity wound drainage possible cellulitis ?Bactrim single strength twice daily ordered outpatient.  Currently receiving cefepime daily and vancomycin IV with dialysis. ?Pain control per primary team ? ? LOS: 0 ?[place] Dottie Vaquerano ?4/10/202312:01 PM ? ?[facility] ?[place], [place] ?[phone] ? ?

## 2021-10-18 NOTE — Progress Notes (Signed)
?PROGRESS NOTE ? ? ? ?[name]  GYF:[id number] DOB: 09-08-1955 DOA: 10/18/2021 ?PCP: [facility] [place]  ? ?Assessment & Plan: ?  ?Principal Problem: ?  Cellulitis ? ? ?Right lower extremity cellulitis: w/ positive MRSA screen. Continue on IV vanco, cefepime. Wound care consulted  ?  ?ESRD: on HD MWF. Nephro consulted  ?  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 No need for a transfusion currently  ?  ?Thrombocytopenia: etiology unclear. Will continue to monitor  ?  ?Permanent a. fib: continue on coreg, eliquis ?  ?Chronic systolic CHF: EF is 40 to 45%. Volume management w/ HD. Monitor I/Os ?  ?Generalized weakness: PT/OT consulted ? ?Obesity: BMI 36.6. Complicates overall care & prognosis  ? ? ?DVT prophylaxis: eliquis  ?Code Status: full  ?Family Communication:  ?Disposition Plan: depends on OT/PT recs  ? ?Level of care: Telemetry Medical ? ?Status is: Inpatient ?Remains inpatient appropriate because: requiring IV abxs  ? ? ?Consultants:  ?Nephro  ? ?Procedures:  ? ?Antimicrobials: vanco, cefepime  ? ? ?Subjective: ?Pt c/o malaise  ? ?Objective: ?Vitals:  ? 10/18/21 0700 10/18/21 0730 10/18/21 0925 10/18/21 1410  ?BP: (!) 101/52 108/86 127/70   ?Pulse: 71 70 75   ?Resp: 20 18 16    ?Temp:   98.3 ?F (36.8 ?C) 97.9 ?F (36.6 ?C)  ?TempSrc:    Oral  ?SpO2: 90% 93% 100%   ?Weight:    101.6 kg  ?Height:      ? ? ?Intake/Output Summary (Last 24 hours) at 10/18/2021 1414 ?Last data filed at 10/18/2021 0545 ?Gross per 24 hour  ?Intake 298.52 ml  ?Output --  ?Net 298.52 ml  ? ?Filed Weights  ? 10/18/21 0314 10/18/21 1410  ?Weight: 122.5 kg 101.6 kg  ? ? ?Examination: ? ?General exam: Appears lethargic.  ?Respiratory system: diminished breath sounds b/l  ?Cardiovascular system: S1 & S2 +. No rubs, gallops or clicks.  ?Gastrointestinal system: Abdomen is obese, soft and nontender. Normal bowel sounds heard. ?Central nervous system: lethargic. Moves all extremities  ?Psychiatry: Judgement and insight appear at baseline. Flat mood  and affect.  ? ? ? ?Data Reviewed: I have personally reviewed following labs and imaging studies ? ?CBC: ?Recent Labs  ?Lab 10/14/21 ?9163 10/18/21 ?0313 10/18/21 ?8466  ?WBC 8.8 8.1 8.1  ?NEUTROABS 5.5 5.6 5.9  ?HGB 8.8* 8.7* 8.2*  ?HCT 27.1* 27.3* 25.4*  ?MCV 87.4 88.3 88.8  ?PLT 153 136* 126*  ? ?Basic Metabolic Panel: ?Recent Labs  ?Lab 10/14/21 ?5993 10/18/21 ?0313 10/18/21 ?5701 10/18/21 ?7793  ?NA 135 135  --  136  ?K 3.9 4.9  --  5.0  ?CL 95* 96*  --  96*  ?CO2 28 27  --  26  ?GLUCOSE 96 98  --  107*  ?BUN 36* 61*  --  64*  ?CREATININE 5.53* 8.53*  --  8.74*  ?CALCIUM 8.4* 8.7*  --  8.5*  ?MG  --   --  2.1  --   ?PHOS  --   --  6.5*  --   ? ?GFR: ?Estimated Creatinine Clearance: 10.7 mL/min (A) (by C-G formula based on SCr of 8.74 mg/dL (H)). ?Liver Function Tests: ?Recent Labs  ?Lab 10/14/21 ?9030 10/18/21 ?0313 10/18/21 ?0923  ?AST 20 33 29  ?ALT 17 28 25   ?ALKPHOS 82 120 111  ?BILITOT 0.7 1.0 1.0  ?PROT 7.2 7.7 7.4  ?ALBUMIN 2.6* 2.6* 2.5*  ? ?No results for input(s): LIPASE, AMYLASE in the last 168 hours. ?No results for  input(s): AMMONIA in the last 168 hours. ?Coagulation Profile: ?No results for input(s): INR, PROTIME in the last 168 hours. ?Cardiac Enzymes: ?No results for input(s): CKTOTAL, CKMB, CKMBINDEX, TROPONINI in the last 168 hours. ?BNP (last 3 results) ?No results for input(s): PROBNP in the last 8760 hours. ?HbA1C: ?No results for input(s): HGBA1C in the last 72 hours. ?CBG: ?No results for input(s): GLUCAP in the last 168 hours. ?Lipid Profile: ?No results for input(s): CHOL, HDL, LDLCALC, TRIG, CHOLHDL, LDLDIRECT in the last 72 hours. ?Thyroid Function Tests: ?No results for input(s): TSH, T4TOTAL, FREET4, T3FREE, THYROIDAB in the last 72 hours. ?Anemia Panel: ?No results for input(s): VITAMINB12, FOLATE, FERRITIN, TIBC, IRON, RETICCTPCT in the last 72 hours. ?Sepsis Labs: ?No results for input(s): PROCALCITON, LATICACIDVEN in the last 168 hours. ? ?No results found for this or any  previous visit (from the past 240 hour(s)).  ? ? ? ? ? ?Radiology Studies: ?DG Tibia/Fibula Right ? ?Result Date: 10/18/2021 ?CLINICAL DATA:  Right lower extremity infection, initial encounter EXAM: RIGHT TIBIA AND FIBULA - 2 VIEW COMPARISON:  10/01/2021 FINDINGS: No acute fracture or dislocation is noted. Generalized soft tissue swelling is seen consistent with the given clinical history of underlying infection. No bony erosive changes are seen. IMPRESSION: Soft tissue swelling consistent with the given clinical history. No bony abnormality is noted. Electronically Signed   By: Inez Catalina M.D.   On: 10/18/2021 03:55   ? ? ? ? ? ?Scheduled Meds: ? apixaban  5 mg Oral BID  ? atorvastatin  40 mg Oral QHS  ? carvedilol  3.125 mg Oral BID  ? Chlorhexidine Gluconate Cloth  6 each Topical Q0600  ? epoetin (EPOGEN/PROCRIT) injection  10,000 Units Intravenous Q M,W,F-HD  ? pantoprazole  40 mg Oral Daily  ? polyethylene glycol  17 g Oral Daily  ? pregabalin  25 mg Oral QHS  ? senna-docusate  1 tablet Oral QHS  ? tamsulosin  0.4 mg Oral Daily  ? ?Continuous Infusions: ? [START ON 10/19/2021] ceFEPime (MAXIPIME) IV    ? vancomycin 1,000 mg (10/18/21 1159)  ? ? ? LOS: 0 days  ? ? ?Time spent: 33 mins ? ? ? ?Wyvonnia Dusky, MD ?[facility] ?Pager [phone] ? ?If 7PM-7AM, please contact night-coverage ?10/18/2021, 2:14 PM  ? ?

## 2021-10-18 NOTE — Progress Notes (Signed)
Dressing changed to RLE per wound care orders. Weeping profusely. Pre-medicated with dilaudid 1 mg. Tolerated poorly. Patient refused LLE dressing change at this time. Patient currently dangling on edge of bed. Educated on fall risk safety precautions. Nonskid socks on. Call bell within reach.  ?

## 2021-10-18 NOTE — TOC Progression Note (Signed)
Transition of Care (TOC) - Progression Note  ? ? ?Patient Details  ?Name: [name] ?MRN: [id number] ?Date of Birth: [birth year] ? ?Transition of Care (TOC) CM/SW Contact  ?Conception Oms, RN ?Phone Number: ?10/18/2021, 4:00 PM ? ?Clinical Narrative:   Patient was at [facility] prior to hospital admissions, they did not hold the bed, FL2 completed, PASSR obtained, Bedsearch sent ? ? ? ?  ?  ? ?Expected Discharge Plan and Services ?  ?  ?  ?  ?  ?                ?  ?  ?  ?  ?  ?  ?  ?  ?  ?  ? ? ?Social Determinants of Health (SDOH) Interventions ?  ? ?Readmission Risk Interventions ? ?  10/03/2021  ?  1:48 PM  ?Readmission Risk Prevention Plan  ?Transportation Screening Complete  ?Medication Review Press photographer) Complete  ?PCP or Specialist appointment within 3-5 days of discharge Complete  ?[place] or Home Care Consult Complete  ?Palliative Care Screening Not Applicable  ?[place] Not Applicable  ? ? ?

## 2021-10-18 NOTE — Progress Notes (Signed)
Hemodialysis patient known at [facility] MWF 11:00am. Patient was recently discharged to [facility] for rehab.  ?

## 2021-10-19 DIAGNOSIS — R531 Weakness: Secondary | ICD-10-CM | POA: Diagnosis not present

## 2021-10-19 DIAGNOSIS — N186 End stage renal disease: Secondary | ICD-10-CM | POA: Diagnosis not present

## 2021-10-19 DIAGNOSIS — L03119 Cellulitis of unspecified part of limb: Secondary | ICD-10-CM | POA: Diagnosis not present

## 2021-10-19 DIAGNOSIS — Z992 Dependence on renal dialysis: Secondary | ICD-10-CM

## 2021-10-19 LAB — CBC WITH DIFFERENTIAL/PLATELET
Abs Immature Granulocytes: 0.06 10*3/uL (ref 0.00–0.07)
Basophils Absolute: 0 10*3/uL (ref 0.0–0.1)
Basophils Relative: 0 %
Eosinophils Absolute: 0.2 10*3/uL (ref 0.0–0.5)
Eosinophils Relative: 2 %
HCT: 26 % — ABNORMAL LOW (ref 39.0–52.0)
Hemoglobin: 8.4 g/dL — ABNORMAL LOW (ref 13.0–17.0)
Immature Granulocytes: 1 %
Lymphocytes Relative: 13 %
Lymphs Abs: 1.1 10*3/uL (ref 0.7–4.0)
MCH: 28.2 pg (ref 26.0–34.0)
MCHC: 32.3 g/dL (ref 30.0–36.0)
MCV: 87.2 fL (ref 80.0–100.0)
Monocytes Absolute: 1.4 10*3/uL — ABNORMAL HIGH (ref 0.1–1.0)
Monocytes Relative: 17 %
Neutro Abs: 5.5 10*3/uL (ref 1.7–7.7)
Neutrophils Relative %: 67 %
Platelets: 119 10*3/uL — ABNORMAL LOW (ref 150–400)
RBC: 2.98 MIL/uL — ABNORMAL LOW (ref 4.22–5.81)
RDW: 16.1 % — ABNORMAL HIGH (ref 11.5–15.5)
WBC: 8.2 10*3/uL (ref 4.0–10.5)
nRBC: 0 % (ref 0.0–0.2)

## 2021-10-19 LAB — BASIC METABOLIC PANEL
Anion gap: 13 (ref 5–15)
BUN: 36 mg/dL — ABNORMAL HIGH (ref 8–23)
CO2: 28 mmol/L (ref 22–32)
Calcium: 8.3 mg/dL — ABNORMAL LOW (ref 8.9–10.3)
Chloride: 96 mmol/L — ABNORMAL LOW (ref 98–111)
Creatinine, Ser: 5.46 mg/dL — ABNORMAL HIGH (ref 0.61–1.24)
GFR, Estimated: 11 mL/min — ABNORMAL LOW (ref >60)
Glucose, Bld: 95 mg/dL (ref 70–99)
Potassium: 4.2 mmol/L (ref 3.5–5.1)
Sodium: 137 mmol/L (ref 135–145)

## 2021-10-19 MED ORDER — SODIUM CHLORIDE 0.9 % IV SOLN: INTRAVENOUS | Status: DC | PRN

## 2021-10-19 NOTE — Progress Notes (Signed)
Informed patient of drainage from wounds x2.. Patient declined wound care during shift reporting that he wants to wait until the morning.  ?

## 2021-10-19 NOTE — Progress Notes (Signed)
[facility] ?[place], [place] ?10/19/21 ? ?Subjective:  ? ?Hospital day # 1 ? ?[name] 66 year old African-American male with past medical history including persistent atrial fibrillation on Eliquis, hypertension, hyperlipidemia, GERD, BPH, and end-stage renal disease on hemodialysis.  Patient presents to the emergency department from [facility] with complaints of right lower extremity pain and purulent drainage for 2 days.  Patient has been admitted for Cellulitis [L03.90] ?ESRD on dialysis ([place]) [N18.6, Z99.2] ?Cellulitis of right lower extremity [L03.115] ? ?Patient is known to our practice and receives outpatient dialysis treatments at this [facility] on a Monday Wednesday Friday schedule.   ? ?Update: ?Patient seen sitting in chair ?Alert and oriented ?Reports improved pain control during this admission ?Denies shortness of breath ? ?Objective:  ?Vital signs in last 24 hours:  ?Temp:  [97.5 ?F (36.4 ?C)-98.5 ?F (36.9 ?C)] 98.1 ?F (36.7 ?C) (04/11 1147) ?Pulse Rate:  [51-95] 73 (04/11 1147) ?Resp:  [14-25] 18 (04/11 1147) ?BP: (102-139)/(55-82) 112/78 (04/11 1147) ?SpO2:  [93 %-100 %] 100 % (04/11 1147) ?Weight:  [119.2 kg-120.1 kg] 119.2 kg (04/10 2050) ? ?Weight change: -2.371 kg ?Filed Weights  ? 10/18/21 0314 10/18/21 1654 10/18/21 2050  ?Weight: 122.5 kg 120.1 kg 119.2 kg  ? ? ?Intake/Output: ?  ? ?Intake/Output Summary (Last 24 hours) at 10/19/2021 1410 ?Last data filed at 10/19/2021 4259 ?Gross per 24 hour  ?Intake 200.11 ml  ?Output 2036 ml  ?Net -1835.89 ml  ? ? ? ? ?Physical Exam: ?General: NAD  ?HEENT Anicteric, moist oral mucous membranes  ?Pulm/lungs Normal breathing effort on room air  ?CVS/Heart No rub or gallop, irregular  ?Abdomen:  Soft  ?Extremities: 2+  pitting edema bilateral lower extremities, Bilat LE gauze dressings  ?Neurologic: Alert oriented, moving all 4 extremities  ?Skin: Bilateral lower extremity wounds  ?Access: Left upper arm AV  fistula  ?   ? ? ?Basic Metabolic Panel: ? ?Recent Labs  ?Lab 10/14/21 ?5638 10/18/21 ?0313 10/18/21 ?7564 10/18/21 ?3329 10/19/21 ?0540  ?NA 135 135  --  136 137  ?K 3.9 4.9  --  5.0 4.2  ?CL 95* 96*  --  96* 96*  ?CO2 28 27  --  26 28  ?GLUCOSE 96 98  --  107* 95  ?BUN 36* 61*  --  64* 36*  ?CREATININE 5.53* 8.53*  --  8.74* 5.46*  ?CALCIUM 8.4* 8.7*  --  8.5* 8.3*  ?MG  --   --  2.1  --   --   ?PHOS  --   --  6.5*  --   --   ? ? ? ? ?CBC: ?Recent Labs  ?Lab 10/14/21 ?5188 10/18/21 ?0313 10/18/21 ?4166 10/19/21 ?0540  ?WBC 8.8 8.1 8.1 8.2  ?NEUTROABS 5.5 5.6 5.9 5.5  ?HGB 8.8* 8.7* 8.2* 8.4*  ?HCT 27.1* 27.3* 25.4* 26.0*  ?MCV 87.4 88.3 88.8 87.2  ?PLT 153 136* 126* 119*  ? ? ? ?  ?Lab Results  ?Component Value Date  ? HEPBSAG NON REACTIVE 10/04/2021  ? HEPBSAB Reactive (A) 10/04/2021  ? HEPBIGM Negative 11/15/2018  ? ? ? ? ?Microbiology: ? ?Recent Results (from the past 240 hour(s))  ?CULTURE, BLOOD (ROUTINE X 2) w Reflex to ID Panel     Status: None (Preliminary result)  ? Collection Time: 10/18/21  4:39 AM  ? Specimen: Right Antecubital; Blood  ?Result Value Ref Range Status  ? Specimen Description RIGHT ANTECUBITAL  Final  ? Special Requests   Final  ?  BOTTLES DRAWN AEROBIC AND ANAEROBIC Blood Culture adequate volume  ? Culture   Final  ?  NO GROWTH < 24 HOURS ?Performed at [facility], [address], [place], [place] [address] ?  ? Report Status PENDING  Incomplete  ?CULTURE, BLOOD (ROUTINE X 2) w Reflex to ID Panel     Status: None (Preliminary result)  ? Collection Time: 10/18/21  4:44 AM  ? Specimen: BLOOD RIGHT FOREARM  ?Result Value Ref Range Status  ? Specimen Description BLOOD RIGHT FOREARM  Final  ? Special Requests   Final  ?  BOTTLES DRAWN AEROBIC AND ANAEROBIC Blood Culture results may not be optimal due to an excessive volume of blood received in culture bottles  ? Culture   Final  ?  NO GROWTH < 24 HOURS ?Performed at [facility], [address] [place], [place]  [address] ?  ? Report Status PENDING  Incomplete  ? ? ? ?Coagulation Studies: ?No results for input(s): LABPROT, INR in the last 72 hours. ? ?Urinalysis: ?No results for input(s): COLORURINE, LABSPEC, [place], GLUCOSEU, HGBUR, BILIRUBINUR, KETONESUR, PROTEINUR, UROBILINOGEN, NITRITE, LEUKOCYTESUR in the last 72 hours. ? ?Invalid input(s): APPERANCEUR  ? ? ?Imaging: ?DG Tibia/Fibula Right ? ?Result Date: 10/18/2021 ?CLINICAL DATA:  Right lower extremity infection, initial encounter EXAM: RIGHT TIBIA AND FIBULA - 2 VIEW COMPARISON:  10/01/2021 FINDINGS: No acute fracture or dislocation is noted. Generalized soft tissue swelling is seen consistent with the given clinical history of underlying infection. No bony erosive changes are seen. IMPRESSION: Soft tissue swelling consistent with the given clinical history. No bony abnormality is noted. Electronically Signed   By: Inez Catalina M.D.   On: 10/18/2021 03:55   ? ? ?Medications:  ? ? sodium chloride    ? ceFEPime (MAXIPIME) IV 1 g (10/19/21 0602)  ? vancomycin Stopped (10/18/21 1300)  ? ? apixaban  5 mg Oral BID  ? atorvastatin  40 mg Oral QHS  ? carvedilol  3.125 mg Oral BID  ? Chlorhexidine Gluconate Cloth  6 each Topical Q0600  ? epoetin (EPOGEN/PROCRIT) injection  10,000 Units Intravenous Q M,W,F-HD  ? pantoprazole  40 mg Oral Daily  ? polyethylene glycol  17 g Oral Daily  ? pregabalin  25 mg Oral QHS  ? senna-docusate  1 tablet Oral QHS  ? tamsulosin  0.4 mg Oral Daily  ? ?sodium chloride, acetaminophen, albuterol, HYDROmorphone (DILAUDID) injection, melatonin, ondansetron (ZOFRAN) IV, oxyCODONE, polyethylene glycol ? ?Assessment/ Plan:  ?66 y.o. male with end-stage renal disease, hypertension, atrial fibrillation, coronary artery disease, pulmonary embolism, hyperlipidemia, polysubstance abuse, history of chronic pain syndrome ?Admission at [facility] for acute respiratory failure, COVID-19  in January 2023 ?This time he is admitted on 10/18/2021 for ?Cellulitis  [L03.90] ?ESRD on dialysis ([place]) [N18.6, Z99.2] ?Cellulitis of right lower extremity [L03.115] ? ?[place]  240 minutes.  Left arm AV fistula.  15-gauge needles.  3 times per week.  MWF.  Target weight 120 kg. ? ?#End-stage renal disease with LE edema ?Patient received dialysis yesterday, UF goal 2 L achieved.  Next treatment scheduled for Wednesday. ? ? ?#Anemia of chronic kidney disease ?Lab Results  ?Component Value Date  ? HGB 8.4 (L) 10/19/2021  ?Mircera received outpatient ?Hemoglobin 8.4, currently below desired target. Will continue EPO with treatments ? ?#Secondary hyperparathyroidism ?Lab Results  ?Component Value Date  ? PTH 758 (H) 08/15/2018  ? CALCIUM 8.3 (L) 10/19/2021  ? PHOS 6.5 (H) 10/18/2021  ?We will continue to monitor  bone minerals during this admission.  Phosphorus remains elevated.  May consider binders ? ?#Right lower extremity wound drainage possible cellulitis ?Bactrim single strength twice daily ordered outpatient.  Currently receiving cefepime daily and vancomycin IV with dialysis. ? Patient refused wound care overnight, per nursing note. ? Pain management improved ? ? LOS: 1 ?Jeannette ?4/11/20232:10 PM ? ?[facility] ?[place], [place] ?[phone] ? ?

## 2021-10-19 NOTE — Progress Notes (Signed)
?PROGRESS NOTE ? ? ?HPI was taken from Dr. Nevada Crane: ?[name] is a 66 y.o. male with medical history significant for ESRD on HD MWF, hypertension, hyperlipidemia, persistent atrial fibrillation on Eliquis, GERD, BPH, chronic pain syndrome, who presents to [facility] ED with complaints of right lower extremity pain and wound with purulent drainage for the past 2 days.  The patient has Unna boots on both lower extremities and states that the right lower extremity began to leak purulent fluid through dressing.  Patient was recently admitted on 10/02/2021 for bilateral lower extremity ulcers and abscesses s/p I&D on 3/30, received IV antibiotics therapy and was discharged to SNF on 10/14/2021 with prescription for Bactrim with end date 10/16/21.  Patient was brought in via EMS from [facility].  In the ED found to have significant skin breakdown involving right lower extremity.  Received cefepime and IV vancomycin in the ED.  TRH, hospitalist team, asked to admit. ?  ?ED Course: Temperature 97.9.  BP 147/89, pulse 84, respiration rate 27, saturation 93% on room air.  Lab studies significant for hemoglobin 8.7, MCV 88, platelet count 136.  Last MRSA screening test positive on 10/02/2021. ? ? ?[name]  WVP:[id number] DOB: 1955/07/23 DOA: 10/18/2021 ?PCP: [facility]  ? ?Assessment & Plan: ?  ?Principal Problem: ?  Cellulitis ? ? ?Right lower extremity cellulitis: w/ positive MRSA screen. Continue on IV vanco, cefepime. Continue w/ wound care. Oxycodone, dilaudid prn for pain. Consider ID consult if cellulitis not improving   ?  ?ESRD: on HD MWF. Nephro following and recs apprec  ?  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 H&H are labile  ?  ?Thrombocytopenia: etiology unclear. Will continue to monitor  ?  ?Permanent a. fib: continue on eliquis, coreg  ?  ?Chronic systolic CHF: EF is 40 to 45%. Volume management w/ HD. Monitor I/Os ?  ?Generalized weakness: OT recs SNF ? ?Obesity: BMI 35.6. Complicates overall care &  prognosis  ? ? ? ?DVT prophylaxis: eliquis  ?Code Status: full  ?Family Communication:  ?Disposition Plan: depends on OT/PT recs  ? ?Level of care: Telemetry Medical ? ?Status is: Inpatient ?Remains inpatient appropriate because: requiring IV abxs  ? ? ?Consultants:  ?Nephro  ? ?Procedures:  ? ?Antimicrobials: vanco, cefepime  ? ? ?Subjective: ?Pt c/o right leg pain  ? ?Objective: ?Vitals:  ? 10/18/21 2105 10/18/21 2121 10/19/21 0130 10/19/21 0524  ?BP:  126/73 102/61 114/63  ?Pulse: 78 83 81 75  ?Resp: (!) 21 16  18   ?Temp:  98 ?F (36.7 ?C)  98.5 ?F (36.9 ?C)  ?TempSrc:      ?SpO2: 98% 100% 98% 96%  ?Weight:      ?Height:      ? ? ?Intake/Output Summary (Last 24 hours) at 10/19/2021 0650 ?Last data filed at 10/19/2021 5462 ?Gross per 24 hour  ?Intake 200.11 ml  ?Output 2036 ml  ?Net -1835.89 ml  ? ?Filed Weights  ? 10/18/21 0314 10/18/21 1654 10/18/21 2050  ?Weight: 122.5 kg 120.1 kg 119.2 kg  ? ? ?Examination: ? ?General exam: Appears calm but uncomfortable. Obese  ?Respiratory system: decreased breath sounds b/l  ?Cardiovascular system: S1/S2+. No rubs or gallops  ?Gastrointestinal system: Abd is soft, NT, obese & hypoactive bowel sounds  ?Central nervous system: Awake & alert. Moves all extremities  ?Psychiatry: Judgement and insight appears at baseline. Flat mood and affect  ? ? ? ?Data Reviewed: I have personally reviewed following labs and imaging studies ? ?CBC: ?Recent  Labs  ?Lab 10/14/21 ?0747 10/18/21 ?0313 10/18/21 ?0924 10/19/21 ?0540  ?WBC 8.8 8.1 8.1 8.2  ?NEUTROABS 5.5 5.6 5.9 5.5  ?HGB 8.8* 8.7* 8.2* 8.4*  ?HCT 27.1* 27.3* 25.4* 26.0*  ?MCV 87.4 88.3 88.8 87.2  ?PLT 153 136* 126* 119*  ? ?Basic Metabolic Panel: ?Recent Labs  ?Lab 10/14/21 ?2637 10/18/21 ?0313 10/18/21 ?8588 10/18/21 ?5027 10/19/21 ?0540  ?NA 135 135  --  136 137  ?K 3.9 4.9  --  5.0 4.2  ?CL 95* 96*  --  96* 96*  ?CO2 28 27  --  26 28  ?GLUCOSE 96 98  --  107* 95  ?BUN 36* 61*  --  64* 36*  ?CREATININE 5.53* 8.53*  --  8.74* 5.46*   ?CALCIUM 8.4* 8.7*  --  8.5* 8.3*  ?MG  --   --  2.1  --   --   ?PHOS  --   --  6.5*  --   --   ? ?GFR: ?Estimated Creatinine Clearance: 18.5 mL/min (A) (by C-G formula based on SCr of 5.46 mg/dL (H)). ?Liver Function Tests: ?Recent Labs  ?Lab 10/14/21 ?7412 10/18/21 ?0313 10/18/21 ?8786  ?AST 20 33 29  ?ALT 17 28 25   ?ALKPHOS 82 120 111  ?BILITOT 0.7 1.0 1.0  ?PROT 7.2 7.7 7.4  ?ALBUMIN 2.6* 2.6* 2.5*  ? ?No results for input(s): LIPASE, AMYLASE in the last 168 hours. ?No results for input(s): AMMONIA in the last 168 hours. ?Coagulation Profile: ?No results for input(s): INR, PROTIME in the last 168 hours. ?Cardiac Enzymes: ?No results for input(s): CKTOTAL, CKMB, CKMBINDEX, TROPONINI in the last 168 hours. ?BNP (last 3 results) ?No results for input(s): PROBNP in the last 8760 hours. ?HbA1C: ?No results for input(s): HGBA1C in the last 72 hours. ?CBG: ?No results for input(s): GLUCAP in the last 168 hours. ?Lipid Profile: ?No results for input(s): CHOL, HDL, LDLCALC, TRIG, CHOLHDL, LDLDIRECT in the last 72 hours. ?Thyroid Function Tests: ?No results for input(s): TSH, T4TOTAL, FREET4, T3FREE, THYROIDAB in the last 72 hours. ?Anemia Panel: ?No results for input(s): VITAMINB12, FOLATE, FERRITIN, TIBC, IRON, RETICCTPCT in the last 72 hours. ?Sepsis Labs: ?No results for input(s): PROCALCITON, LATICACIDVEN in the last 168 hours. ? ?Recent Results (from the past 240 hour(s))  ?CULTURE, BLOOD (ROUTINE X 2) w Reflex to ID Panel     Status: None (Preliminary result)  ? Collection Time: 10/18/21  4:39 AM  ? Specimen: Right Antecubital; Blood  ?Result Value Ref Range Status  ? Specimen Description RIGHT ANTECUBITAL  Final  ? Special Requests   Final  ?  BOTTLES DRAWN AEROBIC AND ANAEROBIC Blood Culture adequate volume  ? Culture   Final  ?  NO GROWTH < 24 HOURS ?Performed at [facility], [address], [place], [place] [address] ?  ? Report Status PENDING  Incomplete  ?CULTURE, BLOOD (ROUTINE X 2) w Reflex to  ID Panel     Status: None (Preliminary result)  ? Collection Time: 10/18/21  4:44 AM  ? Specimen: BLOOD RIGHT FOREARM  ?Result Value Ref Range Status  ? Specimen Description BLOOD RIGHT FOREARM  Final  ? Special Requests   Final  ?  BOTTLES DRAWN AEROBIC AND ANAEROBIC Blood Culture results may not be optimal due to an excessive volume of blood received in culture bottles  ? Culture   Final  ?  NO GROWTH < 24 HOURS ?Performed at [facility], [address] [place], [place] [address] ?  ?  Report Status PENDING  Incomplete  ?  ? ? ? ? ? ?Radiology Studies: ?DG Tibia/Fibula Right ? ?Result Date: 10/18/2021 ?CLINICAL DATA:  Right lower extremity infection, initial encounter EXAM: RIGHT TIBIA AND FIBULA - 2 VIEW COMPARISON:  10/01/2021 FINDINGS: No acute fracture or dislocation is noted. Generalized soft tissue swelling is seen consistent with the given clinical history of underlying infection. No bony erosive changes are seen. IMPRESSION: Soft tissue swelling consistent with the given clinical history. No bony abnormality is noted. Electronically Signed   By: Inez Catalina M.D.   On: 10/18/2021 03:55   ? ? ? ? ? ?Scheduled Meds: ? apixaban  5 mg Oral BID  ? atorvastatin  40 mg Oral QHS  ? carvedilol  3.125 mg Oral BID  ? Chlorhexidine Gluconate Cloth  6 each Topical Q0600  ? epoetin (EPOGEN/PROCRIT) injection  10,000 Units Intravenous Q M,W,F-HD  ? pantoprazole  40 mg Oral Daily  ? polyethylene glycol  17 g Oral Daily  ? pregabalin  25 mg Oral QHS  ? senna-docusate  1 tablet Oral QHS  ? tamsulosin  0.4 mg Oral Daily  ? ?Continuous Infusions: ? sodium chloride    ? ceFEPime (MAXIPIME) IV 1 g (10/19/21 0602)  ? vancomycin Stopped (10/18/21 1300)  ? ? ? LOS: 1 day  ? ? ?Time spent: 25 mins ? ? ? ?Wyvonnia Dusky, MD ?[facility] ?Pager [phone] ? ?If 7PM-7AM, please contact night-coverage ?10/19/2021, 6:50 AM  ? ?

## 2021-10-19 NOTE — Evaluation (Signed)
Physical Therapy Evaluation ?Patient Details ?Name: [name] ?MRN: [id number] ?DOB: 02-Jan-1956 ?Today's Date: 10/19/2021 ? ?History of Present Illness ? 66 y.o. male with medical history significant for ESRD on HD MWF, hypertension, hyperlipidemia, persistent atrial fibrillation on Eliquis, GERD, BPH, chronic pain syndrome, who presents to [facility] ED with complaints of right lower extremity pain and wound with purulent drainage for the past 2 days. Patient was recently admitted on 10/02/2021 for bilateral lower extremity ulcers and abscesses s/p I&D on 3/30, received IV antibiotics therapy and was discharged to SNF on 10/14/2021.  Patient was brought to [facility] in via EMS from [facility].  In the ED, pt found to have significant skin breakdown involving right lower extremity.  Admitted for management of R LE cellulitis.  ?Clinical Impression ? Patient seated in recliner upon arrival to session; alert and oriented, follows commands and agreeable to gait efforts with min encouragement.  Endorses pain to bilat LEs (FACES 6/10); meds requested per RN during session (administered as session ended).  Bilat LEs with generalized edema throughout; ace wraps to LEs, knee distally. Currently requiring min assist for sit/stand, basic transfers and gait (15' x1, 63' x1) with RW.  Demonstrates forward flexed posture, short/shuffling steps with limited foot clearance; maintains bilat knee flexion throughout gait cycle (high risk for buckling); heavy WBing bilat UEs; min cuing for walker position/management throughout distance.  Unsafe/unable to indep ambulate or negotiate household distances/responsibilities at this time. ?Would benefit from skilled PT to address above deficits and promote optimal return to PLOF.; recommend transition to STR upon discharge from acute hospitalization. ? ? ?   ? ?Recommendations for follow up therapy are one component of a multi-disciplinary discharge planning process, led by the attending  physician.  Recommendations may be updated based on patient status, additional functional criteria and insurance authorization. ? ?Follow Up Recommendations Skilled nursing-short term rehab (<3 hours/day) ? ?  ?Assistance Recommended at Discharge Intermittent Supervision/Assistance  ?Patient can return home with the following ? A little help with walking and/or transfers;A little help with bathing/dressing/bathroom;Assistance with cooking/housework;Assist for transportation;Help with stairs or ramp for entrance ? ?  ?Equipment Recommendations Rolling walker (2 wheels)  ?Recommendations for Other Services ?    ?  ?Functional Status Assessment Patient has had a recent decline in their functional status and demonstrates the ability to make significant improvements in function in a reasonable and predictable amount of time.  ? ?  ?Precautions / Restrictions Precautions ?Precautions: Fall ?Restrictions ?Weight Bearing Restrictions: No  ? ?  ? ?Mobility ? Bed Mobility ?  ?  ?  ?  ?  ?  ?  ?General bed mobility comments: seated in recliner beginning/end of treatment session ?  ? ?Transfers ?Overall transfer level: Needs assistance ?Equipment used: Rolling walker (2 wheels) ?Transfers: Sit to/from Stand ?Sit to Stand: Min assist ?  ?  ?  ?  ?  ?General transfer comment: heavy use of UEs to complete, limited ability to flex bilat kness for optimal foot position ?  ? ?Ambulation/Gait ?Ambulation/Gait assistance: Min assist ?Gait Distance (Feet):  (15' x1, 63' x1) ?  ?  ?  ?  ?  ?General Gait Details: forward flexed posture, short/shuffling steps with limited foot clearance; heavy WBing bilat UEs; min cuing for walker position/management throughout distance ? ?Stairs ?  ?  ?  ?  ?  ? ?Wheelchair Mobility ?  ? ?Modified Rankin (Stroke Patients Only) ?  ? ?  ? ?Balance Overall balance assessment: Needs assistance ?Sitting-balance  support: No upper extremity supported, Feet supported ?Sitting balance-Leahy Scale: Good ?  ?   ?Standing balance support: Bilateral upper extremity supported ?Standing balance-Leahy Scale: Fair ?  ?  ?  ?  ?  ?  ?  ?  ?  ?  ?  ?  ?   ? ? ? ?Pertinent Vitals/Pain Pain Assessment ?Pain Assessment: Faces ?Faces Pain Scale: Hurts even more ?Pain Location: BLE ?Pain Descriptors / Indicators: Aching, Constant, Discomfort, Grimacing, Moaning, Sharp ?Pain Intervention(s): Limited activity within patient's tolerance, Monitored during session, Repositioned, Patient requesting pain meds-RN notified  ? ? ?Home Living Family/patient expects to be discharged to:: Private residence ?Living Arrangements: Parent ?Available Help at Discharge: Family;Available PRN/intermittently ?Type of Home: House ?Home Access: Ramped entrance ?  ?  ?  ?Home Layout: One level ?Home Equipment: Rollator (4 wheels);Grab bars - tub/shower;Shower seat ?   ?  ?Prior Function Prior Level of Function : Needs assist ?  ?  ?  ?  ?  ?  ?Mobility Comments: Endorses baseline mobility with SPC; however, recently at [facility] for therapy, reports tolerating/participating with minimal gait efforts ?  ?  ? ? ?Hand Dominance  ?   ? ?  ?Extremity/Trunk Assessment  ? Upper Extremity Assessment ?Upper Extremity Assessment: Overall WFL for tasks assessed ?  ? ?Lower Extremity Assessment ?Lower Extremity Assessment: Generalized weakness (grossly at least 4-/5 throughout; generalized edema with bilat ace wraps to LEs, knees distally) ?  ? ?   ?Communication  ? Communication: No difficulties  ?Cognition Arousal/Alertness: Awake/alert ?Behavior During Therapy: [facility] for tasks assessed/performed ?Overall Cognitive Status: Within Functional Limits for tasks assessed ?  ?  ?  ?  ?  ?  ?  ?  ?  ?  ?  ?  ?  ?  ?  ?  ?  ?  ?  ? ?  ?General Comments   ? ?  ?Exercises    ? ?Assessment/Plan  ?  ?PT Assessment Patient needs continued PT services  ?PT Problem List Decreased strength;Decreased mobility;Decreased safety awareness;Decreased range of motion;Decreased coordination;Decreased  skin integrity;Decreased activity tolerance;Decreased balance;Pain ? ?   ?  ?PT Treatment Interventions DME instruction;Therapeutic exercise;Gait training;Balance training;Stair training;Neuromuscular re-education;Functional mobility training;Therapeutic activities;Patient/family education   ? ?PT Goals (Current goals can be found in the Care Plan section)  ?Acute Rehab PT Goals ?Patient Stated Goal: to decrease leg pain and get stronger to go home ?PT Goal Formulation: With patient ?Time For Goal Achievement: 10/24/21 ?Potential to Achieve Goals: Fair ? ?  ?Frequency Min 2X/week ?  ? ? ?Co-evaluation   ?  ?  ?  ?  ? ? ?  ?AM-PAC PT "6 Clicks" Mobility  ?Outcome Measure Help needed turning from your back to your side while in a flat bed without using bedrails?: None ?Help needed moving from lying on your back to sitting on the side of a flat bed without using bedrails?: A Little ?Help needed moving to and from a bed to a chair (including a wheelchair)?: A Little ?Help needed standing up from a chair using your arms (e.g., wheelchair or bedside chair)?: A Little ?Help needed to walk in hospital room?: A Little ?Help needed climbing 3-5 steps with a railing? : A Lot ?6 Click Score: 18 ? ?  ?End of Session Equipment Utilized During Treatment: Gait belt ?Activity Tolerance: Patient limited by pain ?Patient left: in chair;with call bell/phone within reach;with chair alarm set ?Nurse Communication: Mobility status ?PT Visit Diagnosis: Unsteadiness on  feet (R26.81);Pain;Muscle weakness (generalized) (M62.81) ?Pain - part of body: Leg;Ankle and joints of foot ?  ? ?Time: 3010-4045 ?PT Time Calculation (min) (ACUTE ONLY): 17 min ? ? ?Charges:   PT Evaluation ?$PT Eval Moderate Complexity: 1 Mod ?  ?  ?   ? ?Edna Grover H. Owens Shark, PT, DPT, NCS ?10/19/21, 5:42 PM ?[phone] ? ?

## 2021-10-20 DIAGNOSIS — I890 Lymphedema, not elsewhere classified: Secondary | ICD-10-CM

## 2021-10-20 DIAGNOSIS — L03119 Cellulitis of unspecified part of limb: Secondary | ICD-10-CM | POA: Diagnosis not present

## 2021-10-20 DIAGNOSIS — L97909 Non-pressure chronic ulcer of unspecified part of unspecified lower leg with unspecified severity: Secondary | ICD-10-CM

## 2021-10-20 DIAGNOSIS — I87319 Chronic venous hypertension (idiopathic) with ulcer of unspecified lower extremity: Secondary | ICD-10-CM

## 2021-10-20 LAB — BASIC METABOLIC PANEL
Anion gap: 12 (ref 5–15)
BUN: 50 mg/dL — ABNORMAL HIGH (ref 8–23)
CO2: 28 mmol/L (ref 22–32)
Calcium: 8.2 mg/dL — ABNORMAL LOW (ref 8.9–10.3)
Chloride: 97 mmol/L — ABNORMAL LOW (ref 98–111)
Creatinine, Ser: 7.09 mg/dL — ABNORMAL HIGH (ref 0.61–1.24)
GFR, Estimated: 8 mL/min — ABNORMAL LOW (ref >60)
Glucose, Bld: 106 mg/dL — ABNORMAL HIGH (ref 70–99)
Potassium: 4.2 mmol/L (ref 3.5–5.1)
Sodium: 137 mmol/L (ref 135–145)

## 2021-10-20 LAB — CBC WITH DIFFERENTIAL/PLATELET
Abs Immature Granulocytes: 0.09 10*3/uL — ABNORMAL HIGH (ref 0.00–0.07)
Basophils Absolute: 0.1 10*3/uL (ref 0.0–0.1)
Basophils Relative: 1 %
Eosinophils Absolute: 0.2 10*3/uL (ref 0.0–0.5)
Eosinophils Relative: 3 %
HCT: 25.5 % — ABNORMAL LOW (ref 39.0–52.0)
Hemoglobin: 8.1 g/dL — ABNORMAL LOW (ref 13.0–17.0)
Immature Granulocytes: 1 %
Lymphocytes Relative: 17 %
Lymphs Abs: 1.3 10*3/uL (ref 0.7–4.0)
MCH: 27.7 pg (ref 26.0–34.0)
MCHC: 31.8 g/dL (ref 30.0–36.0)
MCV: 87.3 fL (ref 80.0–100.0)
Monocytes Absolute: 1.1 10*3/uL — ABNORMAL HIGH (ref 0.1–1.0)
Monocytes Relative: 14 %
Neutro Abs: 4.9 10*3/uL (ref 1.7–7.7)
Neutrophils Relative %: 64 %
Platelets: 120 10*3/uL — ABNORMAL LOW (ref 150–400)
RBC: 2.92 MIL/uL — ABNORMAL LOW (ref 4.22–5.81)
RDW: 15.9 % — ABNORMAL HIGH (ref 11.5–15.5)
WBC: 7.7 10*3/uL (ref 4.0–10.5)
nRBC: 0 % (ref 0.0–0.2)

## 2021-10-20 MED ORDER — SODIUM CHLORIDE 0.9 % IV SOLN: 100.0000 mL | INTRAVENOUS | Status: DC | PRN

## 2021-10-20 MED ORDER — SODIUM CHLORIDE 0.9 % IV SOLN
2.0000 g | INTRAVENOUS | Status: DC
  Administered 2021-10-20: 2 g via INTRAVENOUS
  Filled 2021-10-20: qty 12.5

## 2021-10-20 MED ORDER — PENTAFLUOROPROP-TETRAFLUOROETH EX AERO
1.0000 "application " | INHALATION_SPRAY | CUTANEOUS | Status: DC | PRN
  Filled 2021-10-20: qty 30

## 2021-10-20 MED ORDER — LIDOCAINE HCL (PF) 1 % IJ SOLN
5.0000 mL | INTRAMUSCULAR | Status: DC | PRN
  Filled 2021-10-20: qty 5

## 2021-10-20 MED ORDER — OXYCODONE HCL ER 10 MG PO T12A
20.0000 mg | EXTENDED_RELEASE_TABLET | Freq: Two times a day (BID) | ORAL | Status: DC
  Administered 2021-10-20 – 2021-10-21 (×3): 20 mg via ORAL
  Filled 2021-10-20 (×3): qty 2

## 2021-10-20 MED ORDER — EPOETIN ALFA 10000 UNIT/ML IJ SOLN
INTRAMUSCULAR | Status: AC
  Filled 2021-10-20: qty 1

## 2021-10-20 MED ORDER — HEPARIN SODIUM (PORCINE) 1000 UNIT/ML DIALYSIS
1000.0000 [IU] | INTRAMUSCULAR | Status: DC | PRN
  Filled 2021-10-20: qty 1

## 2021-10-20 MED ORDER — ALTEPLASE 2 MG IJ SOLR
2.0000 mg | Freq: Once | INTRAMUSCULAR | Status: DC | PRN
  Filled 2021-10-20: qty 2

## 2021-10-20 MED ORDER — LIDOCAINE-PRILOCAINE 2.5-2.5 % EX CREA
1.0000 "application " | TOPICAL_CREAM | CUTANEOUS | Status: DC | PRN
  Filled 2021-10-20: qty 5

## 2021-10-20 MED ORDER — BUMETANIDE 1 MG PO TABS
2.0000 mg | ORAL_TABLET | Freq: Every day | ORAL | Status: DC
  Administered 2021-10-20 – 2021-10-21 (×2): 2 mg via ORAL
  Filled 2021-10-20 (×2): qty 2

## 2021-10-20 MED ORDER — HYDROMORPHONE HCL 1 MG/ML IJ SOLN
1.0000 mg | Freq: Once | INTRAMUSCULAR | Status: AC
  Administered 2021-10-20: 1 mg via INTRAVENOUS
  Filled 2021-10-20: qty 1

## 2021-10-20 NOTE — Progress Notes (Signed)
Patient declines wound care. States his legs hurt despite Oxycodone and Dilaudid administrations. Patient encouraged to lie in the bed with feet elevated. Patient has been sitting up in the chair with legs dependent since 1900. Patient assisted to bed, elevated legs for less than 5 minutes and patient was ready to get back up to chair.  ?

## 2021-10-20 NOTE — Treatment Plan (Signed)
Diagnosis: ?Lymphedema legs with wounds/ESRD ?Baseline Creatinine ESRD ? ? ?Allergies  ?Allergen Reactions  ? Cyclobenzaprine Nausea Only  ? Amlodipine   ? Clonidine Other (See Comments)  ?  Asymptomatic bradycardia to 38  ? Furosemide Other (See Comments)  ?  Caused renal failure  ? Tylenol [Acetaminophen] Other (See Comments)  ?  Reaction:  Pt states it bothers his liver  ? ? ?OPAT Orders ?Discharge antibiotics: ?Vancomycin 1 gram each on  Mon- Wed- Friday during dialysis ?+  ?Cefepime 2 grams each on Mon-Wed-Friday during dialysis ? ?Total 10 days ?End Date: 10/28/21 ? ? ? ?Labs weekly while on IV antibiotics: ?_X_ CBC with differential ?_X_ CMP ? ?_X_ Vancomycin trough ? ?Fax weekly labs to [phone] ? ?Clinic Follow Up Appt: PRN ? ? ?Call [phone] if any questions ? ?  ?

## 2021-10-20 NOTE — Consult Note (Signed)
NAME: [name]  ?DOB: 12/29/1955  ?MRN: [id number]  ?Date/Time: 10/20/2021 5:03 PM ? ?REQUESTING PROVIDER: Dr. Posey Pronto ?Subjective:  ?REASON FOR CONSULT: Lymphedema legs with wounds ?? ?[name] is a 66 y.o. male with a history of ?End-stage renal disease, atrial fibrillation, hyperlipidemia, hypertension, chronic venous/lymphedema legs with superficial ulceration ?Chronic pain syndrome presents to the ED complaining of right lower extremity pain and wound with purulent drainage for the past 2 days.  Patient was recently in the hospital from 10/01/2021 until 10/14/2021 for edema legs and wounds.  He was started on Vanco cefepime metronidazole and then seen by vascular and underwent on 10/07/2021 irrigation and debridement of extensive lower extremity wounds bilaterally f and nonviable tissue was removed.  Initially had a wound VAC and then it was replaced with Unna boot.  The antibiotics was changed from IV to oral Bactrim on 10/09/2021 and he was discharged on the same to be continued until 10/18/2021.  No surgical culture was sent.  He was discharged on Unna boot.  He is back again with pain and swelling ?Vitals in the ED on 10/18/2021 was BP of 128/73, temperature 98, pulse 83, sats 100%.  WBC 8.1, Hb 8.2, platelet 126 and creatinine 8.74.he was started on vancomycin and cefepime again.  No cultures have been sent.  I am asked to see the patient for antibiotic management.  Patient has no fever ?Is always sitting with this legs dependent.  At home he sleeps in the recliner.  He denies any cough or shortness of breath or chest pain or headache.  His mobility is restricted.  His appetite is fair. ?He has a left total hip replacement many years ago.  Stable with no pain ?Past Medical History:  ?Diagnosis Date  ? Depression   ? Diastolic dysfunction   ? a. 09/2019 Echo: EF 55-60%, no rwma, mod LVH, gr2 DD. Nl RV size/fxn. Mildly dil LA. Ao root 4.5cm.  ? Dilated aortic root ([place])   ? a. 09/2019 Echo: Ao root 4.5cm.   ? Elevated troponin level not due myocardial infarction   ? ESRD (end stage renal disease) ([place])   ? Hypertension   ? Nonobstructive Coronary Artery Disease   ? a. 09/2019 Cath: LM nl, LAD min irregs, D1 nl, D2 min irregs, LCX large, min irregs, RCA large, 20p, 30p/m.  ? PE (pulmonary embolism)   ? Renal insufficiency   ?  ?Past Surgical History:  ?Procedure Laterality Date  ? IRRIGATION AND DEBRIDEMENT ABSCESS Bilateral 10/07/2021  ? Procedure: IRRIGATION AND DEBRIDEMENT ABSCESS BILATERAL LOWER EXTREMITIES;  Surgeon: Algernon Huxley, MD;  Location: [facility] ORS;  Service: Vascular;  Laterality: Bilateral;  ? LEFT HEART CATH AND CORONARY ANGIOGRAPHY N/A 09/30/2019  ? Procedure: LEFT HEART CATH AND CORONARY ANGIOGRAPHY;  Surgeon: Wellington Hampshire, MD;  Location: [facility];  Service: Cardiovascular;  Laterality: N/A;  ? TOTAL HIP ARTHROPLASTY    ?  ?Social History  ? ?Socioeconomic History  ? Marital status: Divorced  ?  Spouse name: Not on file  ? Number of children: Not on file  ? Years of education: Not on file  ? Highest education level: Not on file  ?Occupational History  ? Not on file  ?Tobacco Use  ? Smoking status: Every Day  ?  Packs/day: 0.50  ?  Types: Cigarettes  ? Smokeless tobacco: Never  ?Substance and Sexual Activity  ? Alcohol use: No  ? Drug use: No  ? Sexual activity: Not Currently  ?  Other Topics Concern  ? Not on file  ?Social History Narrative  ? Not on file  ? ?Social Determinants of Health  ? ?Financial Resource Strain: Not on file  ?Food Insecurity: Not on file  ?Transportation Needs: Not on file  ?Physical Activity: Not on file  ?Stress: Not on file  ?Social Connections: Not on file  ?Intimate Partner Violence: Not on file  ?  ?Family History  ?Problem Relation Age of Onset  ? Hypertension Mother   ? ?Allergies  ?Allergen Reactions  ? Cyclobenzaprine Nausea Only  ? Amlodipine   ? Clonidine Other (See Comments)  ?  Asymptomatic bradycardia to 38  ? Furosemide Other (See Comments)  ?  Caused  renal failure  ? Tylenol [Acetaminophen] Other (See Comments)  ?  Reaction:  Pt states it bothers his liver  ? ?I? ?Current Facility-Administered Medications  ?Medication Dose Route Frequency Provider Last Rate Last Admin  ? 0.9 %  sodium chloride infusion   Intravenous PRN Wyvonnia Dusky, MD      ? acetaminophen (TYLENOL) tablet 650 mg  650 mg Oral Q6H PRN Irene Pap N, DO   650 mg at 10/18/21 1130  ? albuterol (PROVENTIL) (2.5 MG/3ML) 0.083% nebulizer solution 2.5 mg  2.5 mg Nebulization Q6H PRN Renda Rolls, RPH      ? apixaban (ELIQUIS) tablet 5 mg  5 mg Oral BID Irene Pap N, DO   5 mg at 10/19/21 2121  ? atorvastatin (LIPITOR) tablet 40 mg  40 mg Oral QHS Hall, Carole N, DO   40 mg at 10/19/21 2121  ? bumetanide (BUMEX) tablet 2 mg  2 mg Oral Daily Fritzi Mandes, MD   2 mg at 10/20/21 1108  ? carvedilol (COREG) tablet 3.125 mg  3.125 mg Oral BID Irene Pap N, DO   3.125 mg at 10/19/21 2121  ? ceFEPIme (MAXIPIME) 2 g in sodium chloride 0.9 % 100 mL IVPB  2 g Intravenous Q M,W,F-1800 Berton Mount, RPH      ? Chlorhexidine Gluconate Cloth 2 % PADS 6 each  6 each Topical Q0600 Colon Flattery, NP   6 each at 10/20/21 0502  ? epoetin alfa (EPOGEN) injection 10,000 Units  10,000 Units Intravenous Q M,W,F-HD Colon Flattery, NP   10,000 Units at 10/18/21 1935  ? HYDROmorphone (DILAUDID) injection 1 mg  1 mg Intravenous Q4H PRN Wyvonnia Dusky, MD   1 mg at 10/20/21 1402  ? melatonin tablet 2.5 mg  2.5 mg Oral QHS PRN Kayleen Memos, DO      ? ondansetron [facility]) injection 4 mg  4 mg Intravenous Q6H PRN Irene Pap N, DO      ? oxyCODONE (Oxy IR/ROXICODONE) immediate release tablet 10 mg  10 mg Oral Q4H PRN Wyvonnia Dusky, MD   10 mg at 10/20/21 1359  ? oxyCODONE (OXYCONTIN) 12 hr tablet 20 mg  20 mg Oral Q12H Fritzi Mandes, MD   20 mg at 10/20/21 1108  ? pantoprazole (PROTONIX) EC tablet 40 mg  40 mg Oral Daily Irene Pap N, DO   40 mg at 10/20/21 2979  ? polyethylene glycol (MIRALAX /  GLYCOLAX) packet 17 g  17 g Oral Daily PRN Irene Pap N, DO      ? polyethylene glycol (MIRALAX / GLYCOLAX) packet 17 g  17 g Oral Daily Irene Pap N, DO   17 g at 10/19/21 0846  ? pregabalin (LYRICA) capsule 25 mg  25 mg Oral QHS Irene Pap  N, DO   25 mg at 10/19/21 2121  ? senna-docusate (Senokot-S) tablet 1 tablet  1 tablet Oral QHS Irene Pap N, DO   1 tablet at 10/19/21 2121  ? tamsulosin (FLOMAX) capsule 0.4 mg  0.4 mg Oral Daily Irene Pap N, DO   0.4 mg at 10/20/21 0856  ? vancomycin (VANCOCIN) IVPB 1000 mg/200 mL premix  1,000 mg Intravenous Q M,W,F-HD Renda Rolls, RPH   Stopped at 10/18/21 1300  ?  ? ?Abtx:  ?Anti-infectives (From admission, onward)  ? ? Start     Dose/Rate Route Frequency Ordered Stop  ? 10/20/21 2200  ceFEPIme (MAXIPIME) 2 g in sodium chloride 0.9 % 100 mL IVPB       ? 2 g ?200 mL/hr over 30 Minutes Intravenous Every M-W-F (1800) 10/20/21 1632    ? 10/19/21 0600  ceFEPIme (MAXIPIME) 1 g in sodium chloride 0.9 % 100 mL IVPB  Status:  Discontinued       ? 1 g ?200 mL/hr over 30 Minutes Intravenous Every 24 hours 10/18/21 0438 10/20/21 1632  ? 10/18/21 1200  vancomycin (VANCOCIN) IVPB 1000 mg/200 mL premix       ? 1,000 mg ?200 mL/hr over 60 Minutes Intravenous Every M-W-F (Hemodialysis) 10/18/21 0511    ? 10/18/21 0530  vancomycin (VANCOREADY) IVPB 1500 mg/300 mL       ? 1,500 mg ?150 mL/hr over 120 Minutes Intravenous  Once 10/18/21 0505 10/18/21 0754  ? 10/18/21 0445  vancomycin (VANCOCIN) IVPB 1000 mg/200 mL premix  Status:  Discontinued       ? 1,000 mg ?200 mL/hr over 60 Minutes Intravenous Every 24 hours 10/18/21 0438 10/18/21 0505  ? 10/18/21 0330  ceFEPIme (MAXIPIME) 1 g in sodium chloride 0.9 % 100 mL IVPB       ? 1 g ?200 mL/hr over 30 Minutes Intravenous  Once 10/18/21 0319 10/18/21 0432  ? 10/18/21 0330  vancomycin (VANCOCIN) IVPB 1000 mg/200 mL premix       ? 1,000 mg ?200 mL/hr over 60 Minutes Intravenous  Once 10/18/21 0319 10/18/21 0545  ? ?  ? ? ?REVIEW OF  SYSTEMS:  ?Const: negative fever, negative chills, negative weight loss ?Eyes: negative diplopia or visual changes, negative eye pain ?ENT: negative coryza, negative sore throat ?Resp: negative cough,

## 2021-10-20 NOTE — Progress Notes (Signed)
Occupational Therapy Treatment ?Patient Details ?Name: [name] ?MRN: [id number] ?DOB: 19-Jan-1956 ?Today's Date: 10/20/2021 ? ? ?History of present illness 66 y.o. male with medical history significant for ESRD on HD MWF, hypertension, hyperlipidemia, persistent atrial fibrillation on Eliquis, GERD, BPH, chronic pain syndrome, who presents to [facility] ED with complaints of right lower extremity pain and wound with purulent drainage for the past 2 days. Patient was recently admitted on 10/02/2021 for bilateral lower extremity ulcers and abscesses s/p I&D on 3/30, received IV antibiotics therapy and was discharged to SNF on 10/14/2021.  Patient was brought to [facility] in via EMS from [facility].  In the ED, pt found to have significant skin breakdown involving right lower extremity.  Admitted for management of R LE cellulitis. ?  ?OT comments ? Pt seen for OT treatment on this date. Upon arrival to room, pt sitting upright in recliner following PT session. Pt reporting some b/l LE pain however agreeable to OT tx following min encouragement. Pt performed sit>stand transfer, walk to/from sink (~60ft, 2x), and x3 standing grooming tasks requiring SUPERVISION d/t decreased balance and activity tolerance. Once seated in recliner pt participated in seated therex consisting of x20 reps of scapular retraction, long arc quad, and hip flexion/marching to promote improved strength and activity tolerance. Pt is making good progress toward goals and continues to benefit from skilled OT services to maximize return to PLOF and minimize risk of future falls, injury, caregiver burden, and readmission. Will continue to follow POC. Discharge recommendation remains appropriate.    ? ?Recommendations for follow up therapy are one component of a multi-disciplinary discharge planning process, led by the attending physician.  Recommendations may be updated based on patient status, additional functional criteria and insurance authorization. ?    ?Follow Up Recommendations ? Skilled nursing-short term rehab (<3 hours/day) (pt would benefit form STR however is planning to return home with HHOT to follow)  ?  ?Assistance Recommended at Discharge Intermittent Supervision/Assistance  ?Patient can return home with the following ? A lot of help with bathing/dressing/bathroom;A little help with walking and/or transfers;Assistance with cooking/housework ?  ?Equipment Recommendations ? BSC/3in1  ?  ?   ?Precautions / Restrictions Precautions ?Precautions: Fall ?Restrictions ?Weight Bearing Restrictions: No  ? ? ?  ? ?Mobility Bed Mobility ?  ?  ?  ?  ?  ?  ?  ?General bed mobility comments: In recliner pre/post session ?  ? ?Transfers ?Overall transfer level: Needs assistance ?Equipment used: Rolling walker (2 wheels) ?Transfers: Sit to/from Stand ?Sit to Stand: Supervision ?  ?  ?  ?  ?  ?General transfer comment: Increased time to perform however he was able to stand and sit without physical assistance. He did require some vcs for imporved technique. ?  ?  ?Balance Overall balance assessment: Needs assistance ?Sitting-balance support: No upper extremity supported, Feet supported ?Sitting balance-Leahy Scale: Good ?Sitting balance - Comments: Able to reach outside BOS to reach for socks ?  ?Standing balance support: No upper extremity supported, During functional activity ?Standing balance-Leahy Scale: Fair ?Standing balance comment: Requires supervision for standing grooming tasks ?  ?  ?  ?  ?  ?  ?  ?  ?  ?  ?  ?   ? ?ADL either performed or assessed with clinical judgement  ? ?ADL Overall ADL's : Needs assistance/impaired ?  ?  ?Grooming: Wash/dry hands;Wash/dry face;Applying deodorant;Supervision/safety;Standing ?  ?  ?  ?  ?  ?  ?  ?  ?  Lower Body Dressing Details (indicate cue type and reason): Pt declined to attempt LB dressing 2/2 pain when donning/doff socks, however pt able to maintain balance while reaching outside BOS to touch b/l heels and toes ?  ?   ?  ?  ?  ?  ?  ?  ?  ? ? ? ?Cognition Arousal/Alertness: Awake/alert ?Behavior During Therapy: [facility] for tasks assessed/performed ?Overall Cognitive Status: Within Functional Limits for tasks assessed ?  ?  ?  ?  ?  ?  ?  ?  ?  ?  ?  ?  ?  ?  ?  ?  ?  ?  ?  ?   ?Exercises General Exercises - Lower Extremity ?Long [employer]: Strengthening, Both, 20 reps, Seated ?Hip Flexion/Marching: Strengthening, Both, 20 reps, Seated ? ?  ?   ?   ? ? ?Pertinent Vitals/ Pain       Pain Assessment ?Pain Assessment: Faces ?Faces Pain Scale: Hurts a little bit ?Pain Location: BLE ?Pain Descriptors / Indicators: Aching, Constant, Discomfort, Grimacing, Moaning, Sharp ?Pain Intervention(s): Limited activity within patient's tolerance, Monitored during session, Premedicated before session, Repositioned ? ?   ?   ? ?Frequency ? Min 2X/week  ? ? ? ? ?  ?Progress Toward Goals ? ?OT Goals(current goals can now be found in the care plan section) ? Progress towards OT goals: Progressing toward goals ? ?Acute Rehab OT Goals ?Patient Stated Goal: to be independent with self-care ?OT Goal Formulation: With patient ?Time For Goal Achievement: 11/01/21 ?Potential to Achieve Goals: Fair  ?Plan Discharge plan remains appropriate;Frequency remains appropriate   ? ?Co-evaluation ? ? ?   ?  ?PT goals addressed during session: Mobility/safety with mobility;Balance;Proper use of DME;Strengthening/ROM ?  ?  ? ?  ?AM-PAC OT "6 Clicks" Daily Activity     ?Outcome Measure ? ? Help from another person eating meals?: None ?Help from another person taking care of personal grooming?: A Little ?Help from another person toileting, which includes using toliet, bedpan, or urinal?: A Little ?Help from another person bathing (including washing, rinsing, drying)?: A Lot ?Help from another person to put on and taking off regular upper body clothing?: A Little ?Help from another person to put on and taking off regular lower body clothing?: A Lot ?6 Click Score: 17 ? ?  ?End  of Session Equipment Utilized During Treatment: Rolling walker (2 wheels) ? ?OT Visit Diagnosis: Unsteadiness on feet (R26.81);Muscle weakness (generalized) (M62.81);Pain ?Pain - Right/Left: Right ?Pain - part of body: Leg ?  ?Activity Tolerance Patient tolerated treatment well ?  ?Patient Left in chair;with call bell/phone within reach;with chair alarm set ?  ?Nurse Communication Mobility status ?  ? ?   ? ?Time: 0998-3382 ?OT Time Calculation (min): 14 min ? ?Charges: OT General Charges ?$OT Visit: 1 Visit ?OT Treatments ?$Self Care/Home Management : 8-22 mins ? ?Fredirick Maudlin, OTR/L ?[place] ? ?

## 2021-10-20 NOTE — TOC Progression Note (Signed)
Transition of Care (TOC) - Progression Note  ? ? ?Patient Details  ?Name: [name] ?MRN: [id number] ?Date of Birth: 20-Nov-1955 ? ?Transition of Care (TOC) CM/SW Contact  ?Conception Oms, RN ?Phone Number: ?10/20/2021, 9:33 AM ? ?Clinical Narrative:   Met with the patient in the room, He wants to go home with Home health, He has a rolling walker in the room and asked for a 3 in1, [employer] will deliver, He asked for [facility] for [facility], Corene Cornea is aware ? ? ? ?Expected Discharge Plan: [place] ?Barriers to Discharge: Barriers Resolved ? ?Expected Discharge Plan and Services ?Expected Discharge Plan: [place] ?  ?  ?  ?  ?                ?DME Arranged: 3-N-1 ?DME Agency: [employer] ?Date DME Agency Contacted: 10/20/21 ?Time DME Agency Contacted: [phone] ?Representative spoke with at DME Agency: Suanne Marker ?HH Arranged: PT, RN, OT ?[place] Agency: [place] ([place]) ?Date HH Agency Contacted: 10/20/21 ?Time [place]: [phone] ?Representative spoke with at [place]: Corene Cornea ? ? ?Social Determinants of Health (SDOH) Interventions ?  ? ?Readmission Risk Interventions ? ?  10/03/2021  ?  1:48 PM  ?Readmission Risk Prevention Plan  ?Transportation Screening Complete  ?Medication Review Press photographer) Complete  ?PCP or Specialist appointment within 3-5 days of discharge Complete  ?[place] or Home Care Consult Complete  ?Palliative Care Screening Not Applicable  ?[place] Not Applicable  ? ? ?

## 2021-10-20 NOTE — Consult Note (Signed)
PHARMACY CONSULT NOTE FOR: ? ?OUTPATIENT  PARENTERAL ANTIBIOTIC THERAPY (OPAT) ? ?Indication: cellulitis ?Regimen: Vancomycin 1 gram each on  Mon- Wed- Friday during dialysis + Cefepime 2 grams each on Mon-Wed-Friday during dialysis ?End date: 10/28/21 ? ?IV antibiotic discharge orders are pended. ?To discharging provider:  please sign these orders via discharge navigator,  ?Select New Orders & click on the button choice - Manage This Unsigned Work.  ?  ? ?Thank you for allowing pharmacy to be a part of this patient's care. ? ?Darnelle Bos, PharmD ?10/20/2021, 5:48 PM ?

## 2021-10-20 NOTE — Progress Notes (Signed)
[facility] ?[place], [place] ?10/20/21 ? ?Subjective:  ? ?Hospital day # 2 ? ?[name] 66 year old African-American male with past medical history including persistent atrial fibrillation on Eliquis, hypertension, hyperlipidemia, GERD, BPH, and end-stage renal disease on hemodialysis.  Patient presents to the emergency department from [facility] with complaints of right lower extremity pain and purulent drainage for 2 days.  Patient has been admitted for Cellulitis [L03.90] ?ESRD on dialysis ([place]) [N18.6, Z99.2] ?Cellulitis of right lower extremity [L03.115] ? ?Patient is known to our practice and receives outpatient dialysis treatments at this [facility] on a Monday Wednesday Friday schedule.   ? ?Update: ?Patient seen sitting up in chair ?Alert and oriented ?Pain well managed with prescribed medications ? ? ?Objective:  ?Vital signs in last 24 hours:  ?Temp:  [97.4 ?F (36.3 ?C)-98.6 ?F (37 ?C)] 97.6 ?F (36.4 ?C) (04/12 1447) ?Pulse Rate:  [46-96] 87 (04/12 1630) ?Resp:  [15-26] 15 (04/12 1630) ?BP: (114-146)/(65-79) 130/67 (04/12 1630) ?SpO2:  [90 %-100 %] 100 % (04/12 1630) ?Weight:  [122.2 kg] 122.2 kg (04/12 1448) ? ?Weight change:  ?Filed Weights  ? 10/18/21 1654 10/18/21 2050 10/20/21 1448  ?Weight: 120.1 kg 119.2 kg 122.2 kg  ? ? ?Intake/Output: ?  ? ?Intake/Output Summary (Last 24 hours) at 10/20/2021 1646 ?Last data filed at 10/20/2021 1016 ?Gross per 24 hour  ?Intake 970 ml  ?Output 0 ml  ?Net 970 ml  ? ? ? ? ?Physical Exam: ?General: NAD  ?HEENT Anicteric, moist oral mucous membranes  ?Pulm/lungs Normal breathing effort on room air  ?CVS/Heart No rub or gallop, irregular  ?Abdomen:  Soft  ?Extremities: 2+  pitting edema bilateral lower extremities, Bilat LE gauze dressings  ?Neurologic: Alert oriented, moving all 4 extremities  ?Skin: Bilateral lower extremity wounds  ?Access: Left upper arm AV fistula  ?   ? ? ?Basic Metabolic Panel: ? ?Recent Labs  ?Lab  10/14/21 ?2505 10/18/21 ?0313 10/18/21 ?3976 10/18/21 ?7341 10/19/21 ?9379 10/20/21 ?0507  ?NA 135 135  --  136 137 137  ?K 3.9 4.9  --  5.0 4.2 4.2  ?CL 95* 96*  --  96* 96* 97*  ?CO2 28 27  --  26 28 28   ?GLUCOSE 96 98  --  107* 95 106*  ?BUN 36* 61*  --  64* 36* 50*  ?CREATININE 5.53* 8.53*  --  8.74* 5.46* 7.09*  ?CALCIUM 8.4* 8.7*  --  8.5* 8.3* 8.2*  ?MG  --   --  2.1  --   --   --   ?PHOS  --   --  6.5*  --   --   --   ? ? ? ? ?CBC: ?Recent Labs  ?Lab 10/14/21 ?0240 10/18/21 ?0313 10/18/21 ?9735 10/19/21 ?3299 10/20/21 ?0507  ?WBC 8.8 8.1 8.1 8.2 7.7  ?NEUTROABS 5.5 5.6 5.9 5.5 4.9  ?HGB 8.8* 8.7* 8.2* 8.4* 8.1*  ?HCT 27.1* 27.3* 25.4* 26.0* 25.5*  ?MCV 87.4 88.3 88.8 87.2 87.3  ?PLT 153 136* 126* 119* 120*  ? ? ? ?  ?Lab Results  ?Component Value Date  ? HEPBSAG NON REACTIVE 10/04/2021  ? HEPBSAB Reactive (A) 10/04/2021  ? HEPBIGM Negative 11/15/2018  ? ? ? ? ?Microbiology: ? ?Recent Results (from the past 240 hour(s))  ?CULTURE, BLOOD (ROUTINE X 2) w Reflex to ID Panel     Status: None (Preliminary result)  ? Collection Time: 10/18/21  4:39 AM  ? Specimen: Right Antecubital; Blood  ?Result Value  Ref Range Status  ? Specimen Description RIGHT ANTECUBITAL  Final  ? Special Requests   Final  ?  BOTTLES DRAWN AEROBIC AND ANAEROBIC Blood Culture adequate volume  ? Culture   Final  ?  NO GROWTH 2 DAYS ?Performed at [facility], [address], [place], [place] [address] ?  ? Report Status PENDING  Incomplete  ?CULTURE, BLOOD (ROUTINE X 2) w Reflex to ID Panel     Status: None (Preliminary result)  ? Collection Time: 10/18/21  4:44 AM  ? Specimen: BLOOD RIGHT FOREARM  ?Result Value Ref Range Status  ? Specimen Description BLOOD RIGHT FOREARM  Final  ? Special Requests   Final  ?  BOTTLES DRAWN AEROBIC AND ANAEROBIC Blood Culture results may not be optimal due to an excessive volume of blood received in culture bottles  ? Culture   Final  ?  NO GROWTH 2 DAYS ?Performed at [facility], [address], [place], [place] [address] ?  ? Report Status PENDING  Incomplete  ? ? ? ?Coagulation Studies: ?No results for input(s): LABPROT, INR in the last 72 hours. ? ?Urinalysis: ?No results for input(s): COLORURINE, LABSPEC, [place], GLUCOSEU, HGBUR, BILIRUBINUR, KETONESUR, PROTEINUR, UROBILINOGEN, NITRITE, LEUKOCYTESUR in the last 72 hours. ? ?Invalid input(s): APPERANCEUR  ? ? ?Imaging: ?No results found. ? ? ?Medications:  ? ? sodium chloride    ? ceFEPime (MAXIPIME) IV    ? vancomycin Stopped (10/18/21 1300)  ? ? apixaban  5 mg Oral BID  ? atorvastatin  40 mg Oral QHS  ? bumetanide  2 mg Oral Daily  ? carvedilol  3.125 mg Oral BID  ? Chlorhexidine Gluconate Cloth  6 each Topical Q0600  ? epoetin (EPOGEN/PROCRIT) injection  10,000 Units Intravenous Q M,W,F-HD  ? oxyCODONE  20 mg Oral Q12H  ? pantoprazole  40 mg Oral Daily  ? polyethylene glycol  17 g Oral Daily  ? pregabalin  25 mg Oral QHS  ? senna-docusate  1 tablet Oral QHS  ? tamsulosin  0.4 mg Oral Daily  ? ?sodium chloride, acetaminophen, albuterol, HYDROmorphone (DILAUDID) injection, melatonin, ondansetron (ZOFRAN) IV, oxyCODONE, polyethylene glycol ? ?Assessment/ Plan:  ?66 y.o. male with end-stage renal disease, hypertension, atrial fibrillation, coronary artery disease, pulmonary embolism, hyperlipidemia, polysubstance abuse, history of chronic pain syndrome ?Admission at [facility] for acute respiratory failure, COVID-19  in January 2023 ?This time he is admitted on 10/18/2021 for ?Cellulitis [L03.90] ?ESRD on dialysis ([place]) [N18.6, Z99.2] ?Cellulitis of right lower extremity [L03.115] ? ?[place]  240 minutes.  Left arm AV fistula.  15-gauge needles.  3 times per week.  MWF.  Target weight 120 kg. ? ?#End-stage renal disease with LE edema ?Scheduled for dialysis later today, UF goal 2L as tolerated. Next treatment scheduled for Friday ? ? ?#Anemia of chronic kidney disease ?Lab Results  ?Component Value Date  ? HGB 8.1 (L) 10/20/2021   ?Mircera received outpatient ?Hgb 8.1, EPO scheduled for dialysis ? ?#Secondary hyperparathyroidism ?Lab Results  ?Component Value Date  ? PTH 758 (H) 08/15/2018  ? CALCIUM 8.2 (L) 10/20/2021  ? PHOS 6.5 (H) 10/18/2021  ?Phosphorus elevated. Will consider binders.  ? ?#Right lower extremity wound drainage possible cellulitis ?Bactrim single strength twice daily ordered outpatient.  Currently receiving cefepime daily and vancomycin IV with dialysis. ? ID following and recommending Vancomycin and Ceftazidine with dialysis for 10 days.   ? ? LOS: 2 ?[place] ?4/12/20234:46 PM ? ?[facility] ?[place], [place] ?  [phone] ? ?

## 2021-10-20 NOTE — TOC Progression Note (Signed)
Transition of Care (TOC) - Progression Note  ? ? ?Patient Details  ?Name: [name] ?MRN: [id number] ?Date of Birth: May 04, 1956 ? ?Transition of Care (TOC) CM/SW Contact  ?Conception Oms, RN ?Phone Number: ?10/20/2021, 2:24 PM ? ?Clinical Narrative:   The patient has a rolator at home and not a RW, will need a Conservation officer, nature ?I notified [employer] to deliver ? ? ? ?Expected Discharge Plan: [place] ?Barriers to Discharge: Barriers Resolved ? ?Expected Discharge Plan and Services ?Expected Discharge Plan: [place] ?  ?  ?  ?  ?                ?DME Arranged: 3-N-1 ?DME Agency: [employer] ?Date DME Agency Contacted: 10/20/21 ?Time DME Agency Contacted: 0626 ?Representative spoke with at DME Agency: Suanne Marker ?HH Arranged: PT, RN, OT ?[place] Agency: [place] ([facility]) ?Date HH Agency Contacted: 10/20/21 ?Time [place]: [phone] ?Representative spoke with at [place]: Corene Cornea ? ? ?Social Determinants of Health (SDOH) Interventions ?  ? ?Readmission Risk Interventions ? ?  10/03/2021  ?  1:48 PM  ?Readmission Risk Prevention Plan  ?Transportation Screening Complete  ?Medication Review Press photographer) Complete  ?PCP or Specialist appointment within 3-5 days of discharge Complete  ?[place] or Home Care Consult Complete  ?Palliative Care Screening Not Applicable  ?[place] Not Applicable  ? ? ?

## 2021-10-20 NOTE — Progress Notes (Signed)
Hemodialysis Post Treatment Note ? ?20 October 2021  ? ?Access: Left Upper Arm AVF  ? ?UF Removed: 2500 ml ? ?Next Scheduled Treatment: 10/22/21  ? ?Note: ?Patient tolerated treatment without incident, goals of hemodialysis met. Blood flow rate maintained throughout course of treatment, targeted UF achieved. Patient with multiple concerns of pain to leg during treatment, chronic secondary bilateral ulcers on legs. Patient elects to have dialysis in recliner, stating pain more tolerable in recliner than bed. Patient was premedicated prior to treatment, did request medication, HD nurse did make request from pharmacy, medication delivered post treatment. Patient LUA AVF maintains BFR, UF removed 2.5. Patient tends to be expressive with his pain. Minimal bleeding post treatment. Report to primary nurse, transported to assigned room.   ?

## 2021-10-20 NOTE — Progress Notes (Cosign Needed)
Patient is not able to walk the distance required to go the bathroom, or he/she is unable to safely negotiate stairs required to access the bathroom.  A 3in1 BSC will alleviate this problem  

## 2021-10-20 NOTE — Progress Notes (Signed)
[place] [facility] [place] ? ? ?PATIENT NAME: [name]   ? ?MR#:  [id number] ? ?DATE OF BIRTH:  04-26-1956 ? ?SUBJECTIVE:  ? ?patient screaming with pain requesting more IV abdominal pain meds while dressing change. Has been noncompliant with raising/keeping his legs elevated despite nursing education. ? ?No family at bedside ?VITALS:  ?Blood pressure 130/67, pulse 87, temperature 97.6 ?F (36.4 ?C), temperature source Oral, resp. rate 15, height 6' (1.829 m), weight 122.2 kg, SpO2 100 %. ? ?PHYSICAL EXAMINATION:  ? ?GENERAL:  66 y.o.-year-old patient lying in the bed with no acute distress. Morbidly obese ?LUNGS: Normal breath sounds bilaterally, no wheezing, rales, rhonchi.  ?CARDIOVASCULAR: S1, S2 normal. No murmurs, rubs, or gallops.  ?ABDOMEN: Soft, nontender, nondistended. Bowel sounds present.  ?EXTREMITIES:  ? ?NEUROLOGIC: nonfocal  patient is alert and awake ?SKIN: as above ? ?LABORATORY PANEL:  ?CBC ?Recent Labs  ?Lab 10/20/21 ?0507  ?WBC 7.7  ?HGB 8.1*  ?HCT 25.5*  ?PLT 120*  ? ? ?Chemistries  ?Recent Labs  ?Lab 10/18/21 ?7829 10/18/21 ?5621 10/19/21 ?3086 10/20/21 ?0507  ?NA  --  136   < > 137  ?K  --  5.0   < > 4.2  ?CL  --  96*   < > 97*  ?CO2  --  26   < > 28  ?GLUCOSE  --  107*   < > 106*  ?BUN  --  64*   < > 50*  ?CREATININE  --  8.74*   < > 7.09*  ?CALCIUM  --  8.5*   < > 8.2*  ?MG 2.1  --   --   --   ?AST  --  29  --   --   ?ALT  --  25  --   --   ?ALKPHOS  --  111  --   --   ?BILITOT  --  1.0  --   --   ? < > = values in this interval not displayed.  ? ? ?Assessment and Plan ?[name] is a 66 y.o. male with medical history significant for ESRD on HD MWF, hypertension, hyperlipidemia, persistent atrial fibrillation on Eliquis, GERD, BPH, chronic pain syndrome, who presents to [facility] ED with complaints of right lower extremity pain and wound with purulent drainage for the past 2 days.  The patient has Unna boots on both lower extremities and states that the  right lower extremity began to leak purulent fluid through dressing.  Patient was recently admitted on 10/02/2021 for bilateral lower extremity ulcers and abscesses s/p I&D on 3/30, received IV antibiotics therapy and was discharged to SNF on 10/14/2021 with prescription for Bactrim with end date 10/16/21.  Patient was brought in via EMS from [facility].  In the ED found to have significant skin breakdown involving right lower extremity.  Received cefepime and IV vancomycin in the ED. ? ?acute on chronic bilateral lower extremity cellulitis with significant edema ?-- IV vancomycin and cefepime ?-- ID input appreciated. Patient will need antibiotics at dialysis ?-- consider ultrafiltration to help with lower extremity swelling ?-- patient encouraged to keep leg elevated. ?-- per WOC--Dressing procedure/placement/frequency: Will implement daily dressing changes using a silver hydrofiber (Aquacel Ag+ Advantage covered with ABD pads for antimicrobial donation and absorption, will secure with dry boot (Kerlix and ACE) over Unna's boot at this time and change daily.  ?A set of pressure redistribution heel boots are provided as well as a pressure  redistribution chair cushion for pressure injury prevention when OOB, as the patient sleeps in a recliner chair ? ?end-stage renal disease on hemodialysis Monday Wednesday Friday. Nephrology following ? ?permanent a fib ?-- continue eliquis, Coreg ? ?chronic systolic CHF ?-- volume management with hemodialysis ?-- EF with last echo 40 to 45% ? ?obesity with BMI 47.3 complicates overall care and prognosis ? ?generalized weakness-- PT recommends rehab. Patient wants to go home. TOC for home health and equipment ? ? ?Procedures: ?Family communication :none ?Consults :ID, Nephrology ?CODE STATUS: full ?DVT Prophylaxis :eliquis ?Level of care: Med-Surg ?Status is: Inpatient ?Remains inpatient appropriate because: IV antibiotics for bilateral chronic lower extremity  infection. ? ?Anticipate discharge in 1 to 2 days most likely tomorrow ?  ? ?TOTAL TIME TAKING CARE OF THIS PATIENT: 25 minutes.  ?>50% time spent on counselling and coordination of care ? ?Note: This dictation was prepared with Dragon dictation along with smaller phrase technology. Any transcriptional errors that result from this process are unintentional. ? ?Fritzi Mandes M.D  ? ? ?[facility]  ? ?CC: ?Primary care physician; [facility]  ?

## 2021-10-20 NOTE — Progress Notes (Signed)
Patient wound dressing done, yellow discharges on the wound noted, with edema on feet. ?

## 2021-10-20 NOTE — Progress Notes (Signed)
Physical Therapy Treatment ?Patient Details ?Name: [name] ?MRN: [id number] ?DOB: [birth year] ?Today's Date: 10/20/2021 ? ? ?History of Present Illness 66 y.o. male with medical history significant for ESRD on HD MWF, hypertension, hyperlipidemia, persistent atrial fibrillation on Eliquis, GERD, BPH, chronic pain syndrome, who presents to [facility] ED with complaints of right lower extremity pain and wound with purulent drainage for the past 2 days. Patient was recently admitted on 10/02/2021 for bilateral lower extremity ulcers and abscesses s/p I&D on 3/30, received IV antibiotics therapy and was discharged to SNF on 10/14/2021.  Patient was brought to [facility] in via EMS from [facility].  In the ED, pt found to have significant skin breakdown involving right lower extremity.  Admitted for management of R LE cellulitis. ? ?  ?PT Comments  ? ? Pt was finishing lunch, sitting in recliner, upon arriving. He agrees to session and was cooperative and pleasant throughout. He stood and ambulated ~ 100 ft with little to no assistance. He has HD planned this afternoon. Pt is progressing well. Though he would benefit form STR to regain abilities quicker, author does feel pt is safe enough to DC home with HHPT to follow. He will continue to benefit from skilled PT at DC to regain strength, improve endurance, and to maximize independence with ADLs. ?   ?Recommendations for follow up therapy are one component of a multi-disciplinary discharge planning process, led by the attending physician.  Recommendations may be updated based on patient status, additional functional criteria and insurance authorization. ? ?Follow Up Recommendations ? Skilled nursing-short term rehab (<3 hours/day) (pt would benefit form STR however is planning to return home with HHPT to follow. He will benefit from continued PT to progress strength and activity tolerance.) ?  ?  ?Assistance Recommended at Discharge Intermittent Supervision/Assistance   ?Patient can return home with the following A little help with walking and/or transfers;A little help with bathing/dressing/bathroom;Assistance with cooking/housework;Assist for transportation;Help with stairs or ramp for entrance ?  ?Equipment Recommendations ? Rolling walker (2 wheels)  ?  ?   ?Precautions / Restrictions Precautions ?Precautions: Fall ?Restrictions ?Weight Bearing Restrictions: No  ?  ? ?Mobility ? Bed Mobility ? General bed mobility comments: In recliner pre/post session ?  ? ?Transfers ?Overall transfer level: Needs assistance ?Equipment used: Rolling walker (2 wheels) ?Transfers: Sit to/from Stand ?Sit to Stand: Supervision ? General transfer comment: Increased time to perform however he was able to stand and sit without physical assistance. He did require some vcs for imporved technique. ?  ? ?Ambulation/Gait ?Ambulation/Gait assistance: Min guard ?Gait Distance (Feet): 100 Feet ?Assistive device: Rolling walker (2 wheels) ?Gait Pattern/deviations: Step-through pattern, Trunk flexed ?Gait velocity: decreased ?  ?  ?General Gait Details: poor gait posture however no LOB or unsteadiness. Distance limited by fatigue however HR/sao2/RR all stable ? ? ?Stairs ?Stairs:  (pt reports he has ramp entry to home) ?  ?  ? ?  ?Balance Overall balance assessment: Needs assistance ?Sitting-balance support: No upper extremity supported, Feet supported ?Sitting balance-Leahy Scale: Good ?  ?  ?Standing balance support: Bilateral upper extremity supported, Reliant on assistive device for balance, During functional activity ?Standing balance-Leahy Scale: Good ?Standing balance comment: reliant on RW for dynamic standing activity ?  ?  ?  ?Cognition Arousal/Alertness: Awake/alert ?Behavior During Therapy: [facility] for tasks assessed/performed ?Overall Cognitive Status: Within Functional Limits for tasks assessed ?  ?   ?General Comments: A&Ox4 ?  ?  ? ?  ?   ?   ? ?  Pertinent Vitals/Pain Pain Assessment ?Pain  Assessment: No/denies pain ?Pain Score: 0-No pain  ? ? ? ?PT Goals (current goals can now be found in the care plan section) Acute Rehab PT Goals ?Patient Stated Goal: go home and get stronger ?Progress towards PT goals: Progressing toward goals ? ?  ?Frequency ? ? ? Min 2X/week ? ? ? ?  ?PT Plan Current plan remains appropriate  ? ? ?Co-evaluation   ?  ?PT goals addressed during session: Mobility/safety with mobility;Balance;Proper use of DME;Strengthening/ROM ?  ?  ? ?  ?AM-PAC PT "6 Clicks" Mobility   ?Outcome Measure ? Help needed turning from your back to your side while in a flat bed without using bedrails?: None ?Help needed moving from lying on your back to sitting on the side of a flat bed without using bedrails?: A Little ?Help needed moving to and from a bed to a chair (including a wheelchair)?: A Little ?Help needed standing up from a chair using your arms (e.g., wheelchair or bedside chair)?: A Little ?Help needed to walk in hospital room?: A Little ?Help needed climbing 3-5 steps with a railing? : A Lot ?6 Click Score: 18 ? ?  ?End of Session   ?Activity Tolerance: Patient tolerated treatment well;Patient limited by fatigue ?Patient left: in chair;with call bell/phone within reach;with chair alarm set ?Nurse Communication: Mobility status ?PT Visit Diagnosis: Unsteadiness on feet (R26.81);Pain;Muscle weakness (generalized) (M62.81) ?  ? ? ?Time: 5427-0623 ?PT Time Calculation (min) (ACUTE ONLY): 17 min ? ?Charges:  $Gait Training: 8-22 mins          ?          ? ?Julaine Fusi PTA ?10/20/21, 1:37 PM  ? ?

## 2021-10-21 DIAGNOSIS — L03119 Cellulitis of unspecified part of limb: Secondary | ICD-10-CM | POA: Diagnosis not present

## 2021-10-21 LAB — HEPATITIS B SURFACE ANTIGEN: Hepatitis B Surface Ag: NONREACTIVE

## 2021-10-21 LAB — HEPATITIS B SURFACE ANTIBODY,QUALITATIVE: Hep B S Ab: REACTIVE — AB

## 2021-10-21 MED ORDER — VANCOMYCIN IV (FOR PTA / DISCHARGE USE ONLY): 1000.0000 mg | INTRAVENOUS | 0 refills | Status: AC

## 2021-10-21 MED ORDER — CEFEPIME IV (FOR PTA / DISCHARGE USE ONLY): 2.0000 g | INTRAVENOUS | 0 refills | Status: AC

## 2021-10-21 MED ORDER — OXYCODONE HCL 10 MG PO TABS: 10.0000 mg | ORAL_TABLET | Freq: Two times a day (BID) | ORAL | 0 refills | Status: DC | PRN

## 2021-10-21 NOTE — Plan of Care (Signed)

## 2021-10-21 NOTE — Discharge Instructions (Signed)
Keep legs elevated while in bed and recliner ?

## 2021-10-21 NOTE — Discharge Summary (Signed)
?Physician Discharge Summary ?  ?Patient: [name] MRN: [id number] DOB: [birth year]  ?Admit date:     10/18/2021  ?Discharge date: 10/21/21  ?Discharge Physician: Fritzi Mandes  ? ?PCP: [facility]  ? ?Recommendations at discharge:  ? ?patient will follow-up with PCP at [facility]. ?Patient advised to discuss with PCP regarding referral to local wound clinic. ?Keep your legs elevated while and recliner and  in bed ? ?Discharge Diagnoses: ?acute on chronic bilateral lower extremity cellulitis with significant edema ? ?Hospital Course: ? ?[name] is a 66 y.o. male with medical history significant for ESRD on HD MWF, hypertension, hyperlipidemia, persistent atrial fibrillation on Eliquis, GERD, BPH, chronic pain syndrome, who presents to [facility] ED with complaints of right lower extremity pain and wound with purulent drainage for the past 2 days.  The patient has Unna boots on both lower extremities and states that the right lower extremity began to leak purulent fluid through dressing.  Patient was recently admitted on 10/02/2021 for bilateral lower extremity ulcers and abscesses s/p I&D on 3/30, received IV antibiotics therapy and was discharged to SNF on 10/14/2021 with prescription for Bactrim with end date 10/16/21.  Patient was brought in via EMS from [facility].  In the ED found to have significant skin breakdown involving right lower extremity.  Received cefepime and IV vancomycin in the ED. ?  ?acute on chronic bilateral lower extremity cellulitis with significant edema ?-- IV vancomycin and cefepime ?-- ID input appreciated. Patient will need antibiotics at dialysis ?-- consider ultrafiltration to help with lower extremity swelling ?-- patient encouraged to keep leg elevated. ?-- per WOC--Dressing procedure/placement/frequency: Will implement daily dressing changes using a silver hydrofiber (Aquacel Ag+ Advantage covered with ABD pads for antimicrobial donation and absorption, will  secure with dry boot (Kerlix and ACE) over Unna's boot at this time and change daily.  ?A set of pressure redistribution heel boots are provided as well as a pressure redistribution chair cushion for pressure injury prevention when OOB, as the patient sleeps in a recliner chair ?-- patient seen by infectious disease. Patient will get vancomycin and cefepime at hemodialysis. Nephrology is aware of antibiotic dosing as outpatient at dialysis. ?-- Patient recommended to follow-up with PCP at [facility] and get local wound clinic referral. ?-- Home health to change daily dressings. ?  ?end-stage renal disease on hemodialysis Monday Wednesday Friday. Nephrology following ?  ?permanent a fib ?-- continue eliquis, Coreg ?  ?chronic systolic CHF ?-- volume management with hemodialysis ?-- EF with last echo 40 to 45% ?  ?obesity with BMI 94.8 complicates overall care and prognosis ?  ?generalized weakness-- PT recommends rehab. Patient wants to go home. TOC for home health and equipment ? ?home health has been arranged. Will discharge patient to home. He is in agreement ?  ?  ? ? ?Consultants: infectious disease, nephrology ?Procedures performed: -in-house dialysis ?Disposition: Home health ?Diet recommendation:  ?Discharge Diet Orders (From admission, onward)  ? ?  Start     Ordered  ? 10/21/21 0000  Diet - low sodium heart healthy       ? 10/21/21 1107  ? ?  ?  ? ?  ? ?Renal diet ?DISCHARGE MEDICATION: ?Allergies as of 10/21/2021   ? ?   Reactions  ? Cyclobenzaprine Nausea Only  ? Amlodipine   ? Clonidine Other (See Comments)  ? Asymptomatic bradycardia to 38  ? Furosemide Other (See Comments)  ? Caused renal failure  ? Tylenol [  acetaminophen] Other (See Comments)  ? Reaction:  Pt states it bothers his liver  ? ?  ? ?  ?Medication List  ?  ? ?TAKE these medications   ? ?albuterol 108 (90 Base) MCG/ACT inhaler ?Commonly known as: VENTOLIN HFA ?Inhale 2 puffs into the lungs every 6 (six) hours as needed for wheezing or shortness of  breath. ?  ?apixaban 5 MG Tabs tablet ?Commonly known as: Eliquis ?Take 1 tablet (5 mg total) by mouth 2 (two) times daily. ?  ?atorvastatin 40 MG tablet ?Commonly known as: LIPITOR ?Take 1 tablet (40 mg total) by mouth at bedtime. ?  ?bumetanide 2 MG tablet ?Commonly known as: BUMEX ?Take 1 tablet (2 mg total) by mouth daily. ?  ?carvedilol 6.25 MG tablet ?Commonly known as: COREG ?Take 6.25 mg by mouth 2 (two) times daily. ?  ?ceFEPime  IVPB ?Commonly known as: MAXIPIME ?Inject 2 g into the vein every Monday, Wednesday, and Friday with hemodialysis for 7 days. To be given at HD ?Indication:  cellulitis ?Last Day of Therapy:  4/19 ?Labs - Once weekly:  CBC/D, CMP, and pre-dialysis vancomycin level. ?Fax weekly labs to [phone] ?Start taking on: October 22, 2021 ?  ?diclofenac Sodium 1 % Gel ?Commonly known as: VOLTAREN ?Apply 2 g topically 4 (four) times daily. ?  ?naloxone 4 MG/0.1ML Liqd nasal spray kit ?Commonly known as: NARCAN ?SMARTSIG:Both Nares ?  ?omeprazole 40 MG capsule ?Commonly known as: PRILOSEC ?Take 40 mg by mouth 2 (two) times daily. ?  ?ondansetron 8 MG tablet ?Commonly known as: ZOFRAN ?TAKE ONE-HALF TABLET BY MOUTH EVERY 8 HOURS AS NEEDED NAUSEA ?  ?Oxycodone HCl 10 MG Tabs ?Take 1 tablet (10 mg total) by mouth every 12 (twelve) hours as needed for moderate pain. ?  ?oxymorphone 10 MG 12 hr tablet ?Commonly known as: OPANA ER ?Take 10 mg by mouth every 12 (twelve) hours. ?  ?polyethylene glycol 17 g packet ?Commonly known as: MIRALAX / GLYCOLAX ?Take 17 g by mouth daily. ?  ?pregabalin 25 MG capsule ?Commonly known as: LYRICA ?Take 25 mg by mouth at bedtime. ?  ?tamsulosin 0.4 MG Caps capsule ?Commonly known as: FLOMAX ?Take 0.4 mg by mouth daily. ?  ?vancomycin  IVPB ?Inject 1,000 mg into the vein every Monday, Wednesday, and Friday with hemodialysis for 7 days. To be given at HD ?Indication:  cellulitis ?Last Day of Therapy:  4/19 ?Labs - Once weekly:  CBC/D, CMP, and pre-dialysis  vancomycin level. ?Fax weekly labs to [phone] ?Start taking on: October 22, 2021 ?  ? ?  ? ?  ?  ? ? ?  ?Durable Medical Equipment  ?(From admission, onward)  ?  ? ? ?  ? ?  Start     Ordered  ? 10/20/21 1425  For home use only DME Walker rolling  Once       ?Question Answer Comment  ?Walker: With 5 Inch Wheels   ?Patient needs a walker to treat with the following condition Difficulty walking   ?  ? 10/20/21 1425  ? ?  ?  ? ?  ? ? ?  ?Discharge Care Instructions  ?(From admission, onward)  ?  ? ? ?  ? ?  Start     Ordered  ? 10/21/21 0000  Discharge wound care:       ?Comments: Wound care to bilateral LE wounds, R>L:  Cleanse LEs with soap and water, rinse and pat gently dry. Cover lesions with pieces of  silver hydrofiber (Aquacel Ag+ Advantage, Kellie Simmering [phone]), top with ABD pads and secure by wrapping from just below toes to just below knee with Kerlix roll gauze. Secure with paper tape. Top Kerlix roll gauze with ACE bandage wrapped in a similar manner. Change daily.  ? 10/21/21 1107  ? ?  ?  ? ?  ? ? Follow-up Information   ? ? [facility] Schedule an appointment as soon as possible for a visit in 1 week(s).   ?Specialty: General Practice ?Why: pt to call and make f/u appt ?Contact information: ?[address] ?[place] [address] ?[phone] ? ? ?  ?  ? ? Wellington Hampshire, MD .   ?Specialty: Cardiology ?Contact information: ?[address] ?[address] ?[place] [address] ?[phone] ? ? ?  ?  ? ?  ?  ? ?  ? ?Discharge Exam: ?Filed Weights  ? 10/18/21 2050 10/20/21 1448 10/20/21 1758  ?Weight: 119.2 kg 122.2 kg 120 kg  ? ? ? ? ?Condition at discharge: fair ? ?The results of significant diagnostics from this hospitalization (including imaging, microbiology, ancillary and laboratory) are listed below for reference.  ? ?Imaging Studies: ?DG Tibia/Fibula Right ? ?Result Date: 10/18/2021 ?CLINICAL DATA:  Right lower extremity infection, initial encounter EXAM: RIGHT TIBIA AND FIBULA - 2 VIEW  COMPARISON:  10/01/2021 FINDINGS: No acute fracture or dislocation is noted. Generalized soft tissue swelling is seen consistent with the given clinical history of underlying infection. No bony erosive changes are

## 2021-10-21 NOTE — Progress Notes (Signed)
[facility] ?[place], [place] ?10/21/21 ? ?Subjective:  ? ?Hospital day # 3 ? ?[name] 66 year old African-American male with past medical history including persistent atrial fibrillation on Eliquis, hypertension, hyperlipidemia, GERD, BPH, and end-stage renal disease on hemodialysis.  Patient presents to the emergency department from [facility] with complaints of right lower extremity pain and purulent drainage for 2 days.  Patient has been admitted for Cellulitis [L03.90] ?ESRD on dialysis ([place]) [N18.6, Z99.2] ?Cellulitis of right lower extremity [L03.115] ? ?Patient is known to our practice and receives outpatient dialysis treatments at this [facility] on a Monday Wednesday Friday schedule.   ? ?Update: ?Late entry.  Patient seen prior to discharge.  He states that he was still having some pain in his lower extremities though better than when he was here last. ?Underwent hemodialysis treatment yesterday and tolerated well. ? ? ?Objective:  ?Vital signs in last 24 hours:  ?Temp:  [97.6 ?F (36.4 ?C)-97.8 ?F (36.6 ?C)] 97.6 ?F (36.4 ?C) (04/13 0741) ?Pulse Rate:  [26-96] 73 (04/13 0741) ?Resp:  [15-26] 16 (04/13 0741) ?BP: (117-146)/(60-79) 126/69 (04/13 0741) ?SpO2:  [90 %-100 %] 98 % (04/13 0741) ?Weight:  [120 kg-122.2 kg] 120 kg (04/12 1758) ? ?Weight change:  ?Filed Weights  ? 10/18/21 2050 10/20/21 1448 10/20/21 1758  ?Weight: 119.2 kg 122.2 kg 120 kg  ? ? ?Intake/Output: ?  ? ?Intake/Output Summary (Last 24 hours) at 10/21/2021 1234 ?Last data filed at 10/21/2021 1018 ?Gross per 24 hour  ?Intake 539.89 ml  ?Output 2500 ml  ?Net -1960.11 ml  ? ? ? ? ?Physical Exam: ?General: NAD  ?HEENT Anicteric, moist oral mucous membranes  ?Pulm/lungs Normal breathing effort on room air  ?CVS/Heart No rub or gallop, irregular  ?Abdomen:  Soft  ?Extremities: 2+  pitting edema bilateral lower extremities, Bilat LE gauze dressings  ?Neurologic: Alert oriented, moving all 4  extremities  ?Skin: Bilateral lower extremity wounds covered  ?Access: Left upper arm AV fistula  ?   ? ? ?Basic Metabolic Panel: ? ?Recent Labs  ?Lab 10/18/21 ?0313 10/18/21 ?7517 10/18/21 ?0017 10/19/21 ?4944 10/20/21 ?0507  ?NA 135  --  136 137 137  ?K 4.9  --  5.0 4.2 4.2  ?CL 96*  --  96* 96* 97*  ?CO2 27  --  26 28 28   ?GLUCOSE 98  --  107* 95 106*  ?BUN 61*  --  64* 36* 50*  ?CREATININE 8.53*  --  8.74* 5.46* 7.09*  ?CALCIUM 8.7*  --  8.5* 8.3* 8.2*  ?MG  --  2.1  --   --   --   ?PHOS  --  6.5*  --   --   --   ? ? ? ? ?CBC: ?Recent Labs  ?Lab 10/18/21 ?0313 10/18/21 ?0924 10/19/21 ?9675 10/20/21 ?0507  ?WBC 8.1 8.1 8.2 7.7  ?NEUTROABS 5.6 5.9 5.5 4.9  ?HGB 8.7* 8.2* 8.4* 8.1*  ?HCT 27.3* 25.4* 26.0* 25.5*  ?MCV 88.3 88.8 87.2 87.3  ?PLT 136* 126* 119* 120*  ? ? ? ?  ?Lab Results  ?Component Value Date  ? HEPBSAG NON REACTIVE 10/21/2021  ? HEPBSAB Reactive (A) 10/04/2021  ? HEPBIGM Negative 11/15/2018  ? ? ? ? ?Microbiology: ? ?Recent Results (from the past 240 hour(s))  ?CULTURE, BLOOD (ROUTINE X 2) w Reflex to ID Panel     Status: None (Preliminary result)  ? Collection Time: 10/18/21  4:39 AM  ? Specimen: Right Antecubital; Blood  ?Result Value  Ref Range Status  ? Specimen Description RIGHT ANTECUBITAL  Final  ? Special Requests   Final  ?  BOTTLES DRAWN AEROBIC AND ANAEROBIC Blood Culture adequate volume  ? Culture   Final  ?  NO GROWTH 2 DAYS ?Performed at [facility], [address], [place], [place] [address] ?  ? Report Status PENDING  Incomplete  ?CULTURE, BLOOD (ROUTINE X 2) w Reflex to ID Panel     Status: None (Preliminary result)  ? Collection Time: 10/18/21  4:44 AM  ? Specimen: BLOOD RIGHT FOREARM  ?Result Value Ref Range Status  ? Specimen Description BLOOD RIGHT FOREARM  Final  ? Special Requests   Final  ?  BOTTLES DRAWN AEROBIC AND ANAEROBIC Blood Culture results may not be optimal due to an excessive volume of blood received in culture bottles  ? Culture   Final  ?  NO GROWTH 2  DAYS ?Performed at [facility], [address] [place], [place] [address] ?  ? Report Status PENDING  Incomplete  ?Aerobic Culture w Gram Stain (superficial specimen)     Status: None (Preliminary result)  ? Collection Time: 10/20/21  2:29 PM  ? Specimen: Wound  ?Result Value Ref Range Status  ? Specimen Description   Final  ?  WOUND RL ?Performed at [facility], [address] [place], [place] [address] ?  ? Special Requests   Final  ?  NONE ?Performed at [facility], [address], [place], [place] [address] ?  ? Gram Stain PENDING  Incomplete  ? Culture   Final  ?  NO GROWTH < 12 HOURS ?Performed at [facility], [place] [address] [place], [place] [address] ?  ? Report Status PENDING  Incomplete  ? ? ? ?Coagulation Studies: ?No results for input(s): LABPROT, INR in the last 72 hours. ? ?Urinalysis: ?No results for input(s): COLORURINE, LABSPEC, [place], GLUCOSEU, HGBUR, BILIRUBINUR, KETONESUR, PROTEINUR, UROBILINOGEN, NITRITE, LEUKOCYTESUR in the last 72 hours. ? ?Invalid input(s): APPERANCEUR  ? ? ?Imaging: ?No results found. ? ? ?Medications:  ? ? sodium chloride    ? sodium chloride    ? sodium chloride    ? ceFEPime (MAXIPIME) IV 2 g (10/20/21 2147)  ? vancomycin 1,000 mg (10/20/21 1710)  ? ? apixaban  5 mg Oral BID  ? atorvastatin  40 mg Oral QHS  ? bumetanide  2 mg Oral Daily  ? carvedilol  3.125 mg Oral BID  ? Chlorhexidine Gluconate Cloth  6 each Topical Q0600  ? epoetin (EPOGEN/PROCRIT) injection  10,000 Units Intravenous Q M,W,F-HD  ? oxyCODONE  20 mg Oral Q12H  ? pantoprazole  40 mg Oral Daily  ? polyethylene glycol  17 g Oral Daily  ? pregabalin  25 mg Oral QHS  ? senna-docusate  1 tablet Oral QHS  ? tamsulosin  0.4 mg Oral Daily  ? ?sodium chloride, sodium chloride, sodium chloride, acetaminophen, albuterol, alteplase, heparin, HYDROmorphone (DILAUDID) injection, lidocaine (PF), lidocaine-prilocaine, melatonin, ondansetron (ZOFRAN) IV, oxyCODONE,  pentafluoroprop-tetrafluoroeth, polyethylene glycol ? ?Assessment/ Plan:  ?66 y.o. male with end-stage renal disease, hypertension, atrial fibrillation, coronary artery disease, pulmonary embolism, hyperlipidemia, polysubstance abuse, history of chronic pain syndrome ?Admission at [facility] for acute respiratory failure, COVID-19  in January 2023 ?This time he is admitted on 10/18/2021 for ?Cellulitis [L03.90] ?ESRD on dialysis ([place]) [N18.6, Z99.2] ?Cellulitis of right lower extremity [L03.115] ? ?[facility]  240 minutes.  Left arm AV fistula.  15-gauge needles.  3  times per week.  MWF.  Target weight 120 kg. ? ?#End-stage renal disease with LE edema ?Patient underwent hemodialysis treatment yesterday.  Tolerated well.  He will undergo his next dialysis treatment tomorrow as an outpatient. ? ? ?#Anemia of chronic kidney disease ?Lab Results  ?Component Value Date  ? HGB 8.1 (L) 10/20/2021  ?Mircera received outpatient ?Patient to resume Mircera as an outpatient. ? ?#Secondary hyperparathyroidism ?Lab Results  ?Component Value Date  ? PTH 758 (H) 08/15/2018  ? CALCIUM 8.2 (L) 10/20/2021  ? PHOS 6.5 (H) 10/18/2021  ?Reevaluate bone mineral metabolism parameters as an outpatient. ? ?#Right lower extremity wound drainage possible cellulitis ?Bactrim single strength twice daily ordered outpatient.  Currently receiving cefepime daily and vancomycin IV with dialysis. ? ID following and recommending Vancomycin and Ceftazidine with dialysis for 10 days.  End date is 10/28/2021. ? ? LOS: 3 ?Jacques Willingham ?4/13/202312:34 PM ? ?[facility] ?[place], [place] ?[phone] ? ?

## 2021-10-22 ENCOUNTER — Inpatient Hospital Stay
Admission: EM | Admit: 2021-10-22 | Discharge: 2021-10-26 | DRG: 602 | Disposition: A | Discharge status: Skilled Nursing Facility | Payer: No Typology Code available for payment source | Attending: Internal Medicine | Admitting: Internal Medicine

## 2021-10-22 ENCOUNTER — Other Ambulatory Visit: Payer: Self-pay

## 2021-10-22 DIAGNOSIS — Z8616 Personal history of COVID-19: Secondary | ICD-10-CM

## 2021-10-22 DIAGNOSIS — N186 End stage renal disease: Secondary | ICD-10-CM | POA: Diagnosis present

## 2021-10-22 DIAGNOSIS — I4819 Other persistent atrial fibrillation: Secondary | ICD-10-CM | POA: Diagnosis present

## 2021-10-22 DIAGNOSIS — I5022 Chronic systolic (congestive) heart failure: Secondary | ICD-10-CM | POA: Diagnosis present

## 2021-10-22 DIAGNOSIS — I132 Hypertensive heart and chronic kidney disease with heart failure and with stage 5 chronic kidney disease, or end stage renal disease: Secondary | ICD-10-CM | POA: Diagnosis present

## 2021-10-22 DIAGNOSIS — R52 Pain, unspecified: Secondary | ICD-10-CM

## 2021-10-22 DIAGNOSIS — I2510 Atherosclerotic heart disease of native coronary artery without angina pectoris: Secondary | ICD-10-CM | POA: Diagnosis present

## 2021-10-22 DIAGNOSIS — J9601 Acute respiratory failure with hypoxia: Secondary | ICD-10-CM | POA: Diagnosis present

## 2021-10-22 DIAGNOSIS — E785 Hyperlipidemia, unspecified: Secondary | ICD-10-CM | POA: Diagnosis present

## 2021-10-22 DIAGNOSIS — F17210 Nicotine dependence, cigarettes, uncomplicated: Secondary | ICD-10-CM | POA: Diagnosis present

## 2021-10-22 DIAGNOSIS — M79604 Pain in right leg: Principal | ICD-10-CM

## 2021-10-22 DIAGNOSIS — I878 Other specified disorders of veins: Secondary | ICD-10-CM

## 2021-10-22 DIAGNOSIS — Z86711 Personal history of pulmonary embolism: Secondary | ICD-10-CM | POA: Diagnosis not present

## 2021-10-22 DIAGNOSIS — Z8249 Family history of ischemic heart disease and other diseases of the circulatory system: Secondary | ICD-10-CM

## 2021-10-22 DIAGNOSIS — Z992 Dependence on renal dialysis: Secondary | ICD-10-CM

## 2021-10-22 DIAGNOSIS — Z7901 Long term (current) use of anticoagulants: Secondary | ICD-10-CM | POA: Diagnosis not present

## 2021-10-22 DIAGNOSIS — N400 Enlarged prostate without lower urinary tract symptoms: Secondary | ICD-10-CM | POA: Diagnosis present

## 2021-10-22 DIAGNOSIS — D631 Anemia in chronic kidney disease: Secondary | ICD-10-CM | POA: Diagnosis present

## 2021-10-22 DIAGNOSIS — L03115 Cellulitis of right lower limb: Principal | ICD-10-CM | POA: Diagnosis present

## 2021-10-22 DIAGNOSIS — I8500 Esophageal varices without bleeding: Secondary | ICD-10-CM | POA: Diagnosis present

## 2021-10-22 DIAGNOSIS — G894 Chronic pain syndrome: Secondary | ICD-10-CM | POA: Diagnosis present

## 2021-10-22 DIAGNOSIS — I5020 Unspecified systolic (congestive) heart failure: Secondary | ICD-10-CM | POA: Diagnosis present

## 2021-10-22 DIAGNOSIS — Z79899 Other long term (current) drug therapy: Secondary | ICD-10-CM | POA: Diagnosis not present

## 2021-10-22 DIAGNOSIS — N2581 Secondary hyperparathyroidism of renal origin: Secondary | ICD-10-CM | POA: Diagnosis present

## 2021-10-22 DIAGNOSIS — I480 Paroxysmal atrial fibrillation: Secondary | ICD-10-CM | POA: Diagnosis present

## 2021-10-22 DIAGNOSIS — D638 Anemia in other chronic diseases classified elsewhere: Secondary | ICD-10-CM | POA: Diagnosis present

## 2021-10-22 DIAGNOSIS — Z789 Other specified health status: Secondary | ICD-10-CM

## 2021-10-22 DIAGNOSIS — K7460 Unspecified cirrhosis of liver: Secondary | ICD-10-CM | POA: Diagnosis present

## 2021-10-22 DIAGNOSIS — L03116 Cellulitis of left lower limb: Secondary | ICD-10-CM | POA: Diagnosis present

## 2021-10-22 DIAGNOSIS — R5381 Other malaise: Secondary | ICD-10-CM | POA: Insufficient documentation

## 2021-10-22 LAB — CBC WITH DIFFERENTIAL/PLATELET
Abs Immature Granulocytes: 0.07 10*3/uL (ref 0.00–0.07)
Basophils Absolute: 0 10*3/uL (ref 0.0–0.1)
Basophils Relative: 0 %
Eosinophils Absolute: 0.1 10*3/uL (ref 0.0–0.5)
Eosinophils Relative: 1 %
HCT: 26.7 % — ABNORMAL LOW (ref 39.0–52.0)
Hemoglobin: 8.4 g/dL — ABNORMAL LOW (ref 13.0–17.0)
Immature Granulocytes: 1 %
Lymphocytes Relative: 5 %
Lymphs Abs: 0.7 10*3/uL (ref 0.7–4.0)
MCH: 28 pg (ref 26.0–34.0)
MCHC: 31.5 g/dL (ref 30.0–36.0)
MCV: 89 fL (ref 80.0–100.0)
Monocytes Absolute: 1.1 10*3/uL — ABNORMAL HIGH (ref 0.1–1.0)
Monocytes Relative: 8 %
Neutro Abs: 12.1 10*3/uL — ABNORMAL HIGH (ref 1.7–7.7)
Neutrophils Relative %: 85 %
Platelets: 139 10*3/uL — ABNORMAL LOW (ref 150–400)
RBC: 3 MIL/uL — ABNORMAL LOW (ref 4.22–5.81)
RDW: 16.1 % — ABNORMAL HIGH (ref 11.5–15.5)
WBC: 14.1 10*3/uL — ABNORMAL HIGH (ref 4.0–10.5)
nRBC: 0 % (ref 0.0–0.2)

## 2021-10-22 LAB — HEPATITIS B SURFACE ANTIBODY, QUANTITATIVE: Hep B S AB Quant (Post): 12.4 m[IU]/mL (ref >9.9)

## 2021-10-22 LAB — BASIC METABOLIC PANEL
Anion gap: 11 (ref 5–15)
BUN: 27 mg/dL — ABNORMAL HIGH (ref 8–23)
CO2: 28 mmol/L (ref 22–32)
Calcium: 8.6 mg/dL — ABNORMAL LOW (ref 8.9–10.3)
Chloride: 101 mmol/L (ref 98–111)
Creatinine, Ser: 4.78 mg/dL — ABNORMAL HIGH (ref 0.61–1.24)
GFR, Estimated: 13 mL/min — ABNORMAL LOW (ref >60)
Glucose, Bld: 120 mg/dL — ABNORMAL HIGH (ref 70–99)
Potassium: 4 mmol/L (ref 3.5–5.1)
Sodium: 140 mmol/L (ref 135–145)

## 2021-10-22 MED ORDER — ACETAMINOPHEN 325 MG PO TABS: 650.0000 mg | ORAL_TABLET | Freq: Four times a day (QID) | ORAL | Status: DC | PRN

## 2021-10-22 MED ORDER — SODIUM CHLORIDE 0.9 % IV SOLN: 1.0000 g | Freq: Once | INTRAVENOUS | Status: DC

## 2021-10-22 MED ORDER — HYDROMORPHONE HCL 1 MG/ML IJ SOLN
1.0000 mg | Freq: Once | INTRAMUSCULAR | Status: AC
Start: 2021-10-22 — End: 2021-10-22
  Administered 2021-10-22: 1 mg via INTRAVENOUS
  Filled 2021-10-22: qty 1

## 2021-10-22 MED ORDER — VANCOMYCIN IV (FOR PTA / DISCHARGE USE ONLY): 1000.0000 mg | INTRAVENOUS | Status: DC

## 2021-10-22 MED ORDER — ATORVASTATIN CALCIUM 20 MG PO TABS
40.0000 mg | ORAL_TABLET | Freq: Every day | ORAL | Status: DC
  Administered 2021-10-23 – 2021-10-25 (×3): 40 mg via ORAL
  Filled 2021-10-22 (×3): qty 2

## 2021-10-22 MED ORDER — ONDANSETRON HCL 4 MG PO TABS: 4.0000 mg | ORAL_TABLET | Freq: Four times a day (QID) | ORAL | Status: DC | PRN

## 2021-10-22 MED ORDER — OXYCODONE HCL 5 MG PO TABS
10.0000 mg | ORAL_TABLET | Freq: Four times a day (QID) | ORAL | Status: DC | PRN
  Administered 2021-10-23 – 2021-10-25 (×3): 10 mg via ORAL
  Filled 2021-10-22 (×4): qty 2

## 2021-10-22 MED ORDER — HYDROMORPHONE HCL 1 MG/ML IJ SOLN
1.0000 mg | Freq: Once | INTRAMUSCULAR | Status: AC
  Administered 2021-10-22: 1 mg via INTRAVENOUS
  Filled 2021-10-22: qty 1

## 2021-10-22 MED ORDER — POLYETHYLENE GLYCOL 3350 17 G PO PACK
17.0000 g | PACK | Freq: Every day | ORAL | Status: DC
  Administered 2021-10-23 – 2021-10-24 (×2): 17 g via ORAL
  Filled 2021-10-22 (×4): qty 1

## 2021-10-22 MED ORDER — ACETAMINOPHEN 650 MG RE SUPP: 650.0000 mg | Freq: Four times a day (QID) | RECTAL | Status: DC | PRN

## 2021-10-22 MED ORDER — SODIUM CHLORIDE 0.9 % IV SOLN
2.0000 g | INTRAVENOUS | Status: DC
  Administered 2021-10-25: 2 g via INTRAVENOUS
  Filled 2021-10-22: qty 12.5
  Filled 2021-10-22: qty 2

## 2021-10-22 MED ORDER — TAMSULOSIN HCL 0.4 MG PO CAPS
0.4000 mg | ORAL_CAPSULE | Freq: Every day | ORAL | Status: DC
  Administered 2021-10-23 – 2021-10-26 (×4): 0.4 mg via ORAL
  Filled 2021-10-22 (×4): qty 1

## 2021-10-22 MED ORDER — VANCOMYCIN HCL IN DEXTROSE 1-5 GM/200ML-% IV SOLN
1000.0000 mg | INTRAVENOUS | Status: DC
  Administered 2021-10-25: 1000 mg via INTRAVENOUS
  Filled 2021-10-22 (×2): qty 200

## 2021-10-22 MED ORDER — CARVEDILOL 3.125 MG PO TABS
6.2500 mg | ORAL_TABLET | Freq: Two times a day (BID) | ORAL | Status: DC
  Administered 2021-10-22 – 2021-10-26 (×7): 6.25 mg via ORAL
  Filled 2021-10-22 (×7): qty 2

## 2021-10-22 MED ORDER — CEFEPIME IV (FOR PTA / DISCHARGE USE ONLY): 2.0000 g | INTRAVENOUS | Status: DC

## 2021-10-22 MED ORDER — APIXABAN 5 MG PO TABS
5.0000 mg | ORAL_TABLET | Freq: Two times a day (BID) | ORAL | Status: DC
Start: 2021-10-23 — End: 2021-10-27
  Administered 2021-10-23 – 2021-10-26 (×7): 5 mg via ORAL
  Filled 2021-10-22 (×7): qty 1

## 2021-10-22 MED ORDER — ONDANSETRON HCL 4 MG/2ML IJ SOLN: 4.0000 mg | Freq: Four times a day (QID) | INTRAMUSCULAR | Status: DC | PRN

## 2021-10-22 MED ORDER — ALBUTEROL SULFATE (2.5 MG/3ML) 0.083% IN NEBU: 3.0000 mL | INHALATION_SOLUTION | Freq: Four times a day (QID) | RESPIRATORY_TRACT | Status: DC | PRN

## 2021-10-22 MED ORDER — HYDROMORPHONE HCL 1 MG/ML IJ SOLN
0.5000 mg | INTRAMUSCULAR | Status: DC | PRN
  Administered 2021-10-23 – 2021-10-26 (×24): 0.5 mg via INTRAVENOUS
  Filled 2021-10-22 (×24): qty 1

## 2021-10-22 MED ORDER — BUMETANIDE 1 MG PO TABS
2.0000 mg | ORAL_TABLET | Freq: Every day | ORAL | Status: DC
  Administered 2021-10-23 – 2021-10-26 (×3): 2 mg via ORAL
  Filled 2021-10-22 (×4): qty 2

## 2021-10-22 NOTE — Assessment & Plan Note (Addendum)
-   Continue IV vancomycin and cefepime as per regimen; end date 4/20 per last ID note ?- Keep legs elevated ?- Per recent wound care recommendation: ?Daily dressing changes using a silver hydrofiber (Aquacel Ag+ Advantage covered with ABD pads for antimicrobial donation and absorption, and secure with dry boot (Kerlix and ACE) over Unna's boot at this time and change daily.  ?-Pressure redistribution heel boots and pressure redistribution chair cushion for pressure injury prevention when OOB, as the patient sleeps in a recliner chair ? ?

## 2021-10-22 NOTE — ED Triage Notes (Signed)
Pt came from home with brother for lower leg pain. Brother states he can no longer care for pt. Pt needs placement.  ?

## 2021-10-22 NOTE — Assessment & Plan Note (Addendum)
-   continue pain control ?

## 2021-10-22 NOTE — ED Provider Notes (Signed)
? ?[facility] ?Provider Note ? ? ? Event Date/Time  ? First MD Initiated Contact with Patient 10/22/21 1859   ?  (approximate) ? ? ?History  ? ?Leg Pain ? ? ?HPI ? ?[name] is a 66 y.o. male who presents to the ED for evaluation of Leg Pain ?  ?I review dc summary from yesterday .  Was admitted for a few days due to acute on chronic lower extremity edema and cellulitis.  Chronic wounds.  Had presented from a SNF then.  ? ?Patient presents to the ED via EMS from home for evaluation of leg pain and inability to care for himself at home.  Reports his brother cannot care for him and he cannot go back home.  Reports severe pain to his legs.  Denies any falls or injuries since he was discharged. ? ?Physical Exam  ? ?Triage Vital Signs: ?ED Triage Vitals  ?Enc Vitals Group  ?   BP   ?   Pulse   ?   Resp   ?   Temp   ?   Temp src   ?   SpO2   ?   Weight   ?   Height   ?   Head Circumference   ?   Peak Flow   ?   Pain Score   ?   Pain Loc   ?   Pain Edu?   ?   Excl. in [place]?   ? ? ?Most recent vital signs: ?Vitals:  ? 10/22/21 2228 10/22/21 2316  ?BP: (!) 142/80 138/78  ?Pulse: 94 88  ?Resp: (!) 21 20  ?Temp: 98.1 ?F (36.7 ?C) 98.6 ?F (37 ?C)  ?SpO2: 99% 100%  ? ? ?General: Awake, no distress.  Obese and chronically ill-appearing. ?CV:  Good peripheral perfusion.  ?Resp:  Normal effort.  ?Abd:  No distention.  ?MSK:  Wrapped with Ace bandages and gauze.  I removed these to visualize pitting edema symmetrically to bilateral lower legs and chronic ulcerative superficial wounds around the ankles.  No purulence noted. ?Neuro:  No focal deficits appreciated. ?Other:   ? ? ?ED Results / Procedures / Treatments  ? ?Labs ?(all labs ordered are listed, but only abnormal results are displayed) ?Labs Reviewed  ?CBC WITH DIFFERENTIAL/PLATELET - Abnormal; Notable for the following components:  ?    Result Value  ? WBC 14.1 (*)   ? RBC 3.00 (*)   ? Hemoglobin 8.4 (*)   ? HCT 26.7 (*)   ? RDW 16.1 (*)   ?  Platelets 139 (*)   ? Neutro Abs 12.1 (*)   ? Monocytes Absolute 1.1 (*)   ? All other components within normal limits  ?BASIC METABOLIC PANEL - Abnormal; Notable for the following components:  ? Glucose, Bld 120 (*)   ? BUN 27 (*)   ? Creatinine, Ser 4.78 (*)   ? Calcium 8.6 (*)   ? GFR, Estimated 13 (*)   ? All other components within normal limits  ? ? ?EKG ? ? ?RADIOLOGY ? ? ?Official radiology report(s): ?No results found. ? ?PROCEDURES and INTERVENTIONS: ? ?.1-3 Lead EKG Interpretation ?Performed by: Vladimir Crofts, MD ?Authorized by: Vladimir Crofts, MD  ? ?  Interpretation: normal   ?  ECG rate:  84 ?  ECG rate assessment: normal   ?  Rhythm: sinus rhythm   ?  Ectopy: none   ?  Conduction: normal   ? ?Medications  ?HYDROmorphone (DILAUDID) injection  1 mg (has no administration in time range)  ?oxyCODONE (Oxy IR/ROXICODONE) immediate release tablet 10 mg (has no administration in time range)  ?atorvastatin (LIPITOR) tablet 40 mg (has no administration in time range)  ?bumetanide (BUMEX) tablet 2 mg (has no administration in time range)  ?carvedilol (COREG) tablet 6.25 mg (has no administration in time range)  ?polyethylene glycol (MIRALAX / GLYCOLAX) packet 17 g (has no administration in time range)  ?tamsulosin (FLOMAX) capsule 0.4 mg (has no administration in time range)  ?apixaban (ELIQUIS) tablet 5 mg (has no administration in time range)  ?albuterol (PROVENTIL) (2.5 MG/3ML) 0.083% nebulizer solution 3 mL (has no administration in time range)  ?acetaminophen (TYLENOL) tablet 650 mg (has no administration in time range)  ?  Or  ?acetaminophen (TYLENOL) suppository 650 mg (has no administration in time range)  ?ondansetron (ZOFRAN) tablet 4 mg (has no administration in time range)  ?  Or  ?ondansetron (ZOFRAN) injection 4 mg (has no administration in time range)  ?HYDROmorphone (DILAUDID) injection 0.5 mg (has no administration in time range)  ?vancomycin (VANCOCIN) IVPB 1000 mg/200 mL premix (has no  administration in time range)  ?ceFEPIme (MAXIPIME) 2 g in sodium chloride 0.9 % 100 mL IVPB (has no administration in time range)  ?HYDROmorphone (DILAUDID) injection 1 mg (1 mg Intravenous Given 10/22/21 1927)  ?HYDROmorphone (DILAUDID) injection 1 mg (1 mg Intravenous Given 10/22/21 2138)  ? ? ? ?IMPRESSION / MDM / ASSESSMENT AND PLAN / ED COURSE  ?I reviewed the triage vital signs and the nursing notes. ? ?66 year old dialysis patient presents to the ED with acute on chronic bilateral lower leg pain requiring medical admission.  Reassuring vital signs.  Blood work with leukocytosis and stigmata of ESRD.  We will provide IV antibiotics due to this and recent diagnosis of cellulitis.  Requiring multiple doses of IV analgesia for pain control.  No safe discharge and we will consult with medicine for ops admission to facilitate pain management, cellulitis management and placement. ? ?Clinical Course as of 10/22/21 2354  ?Fri Oct 22, 2021  ?2015 Reassessed.  Still in quite a bit of pain.  We will redose Dilaudid [DS]  ?  ?Clinical Course User Index ?[DS] Vladimir Crofts, MD  ? ? ? ?FINAL CLINICAL IMPRESSION(S) / ED DIAGNOSES  ? ?Final diagnoses:  ?Bilateral leg pain  ? ? ? ?Rx / DC Orders  ? ?ED Discharge Orders   ? ? None  ? ?  ? ? ? ?Note:  This document was prepared using Dragon voice recognition software and may include unintentional dictation errors. ?  ?Vladimir Crofts, MD ?10/22/21 2355 ? ?

## 2021-10-22 NOTE — Assessment & Plan Note (Signed)
Continue carvedilol and apixaban ?

## 2021-10-22 NOTE — Assessment & Plan Note (Signed)
Hemoglobin at baseline and stable ?

## 2021-10-22 NOTE — H&P (Signed)
?History and Physical  ? ? ?Patient: [name] DOB: 07/31/1955 ?DOA: 10/22/2021 ?DOS: the patient was seen and examined on 10/22/2021 ?PCP: [facility] [place]  ?Patient coming from: Home ? ?Chief Complaint:  ?Chief Complaint  ?Patient presents with  ? Leg Pain  ? ? ?HPI: [name] is a 66 y.o. male with medical history significant for ESRD on HD MWF, HTN, A-fib on Eliquis, cirrhosis with esophageal varices, HFrEF (EF 40 to 45%) chronic venous stasis, recently hospitalized from 4/10 to 4/13 for bilateral lower extremity cellulitis, treated with IV vancomycin and cefepime and discharged home with home health after declining SNF who returns 1 day after discharge after brother with whom he lives states that he is unable to take care of him.  Patient complains of ongoing intractable leg pain related to his cellulitis.  He denies fever or chills or nausea or vomiting. ?ED course: On arrival, vitals within normal limits.  Blood work with WBC 14,000, up from 7000 on 4/12.  Hemoglobin at baseline at 8.4.  Platelets 139,000.  Renal function as expected for dialysis patient.  No EKG or imaging studies done.   ?Patient was treated with 2 doses of Dilaudid in the ED given a dose of Rocephin.  Hospitalist consulted for admission.  ? ?Review of Systems: As mentioned in the history of present illness. All other systems reviewed and are negative. ?Past Medical History:  ?Diagnosis Date  ? Depression   ? Diastolic dysfunction   ? a. 09/2019 Echo: EF 55-60%, no rwma, mod LVH, gr2 DD. Nl RV size/fxn. Mildly dil LA. Ao root 4.5cm.  ? Dilated aortic root ([place])   ? a. 09/2019 Echo: Ao root 4.5cm.  ? Elevated troponin level not due myocardial infarction   ? ESRD (end stage renal disease) ([place])   ? Hypertension   ? Nonobstructive Coronary Artery Disease   ? a. 09/2019 Cath: LM nl, LAD min irregs, D1 nl, D2 min irregs, LCX large, min irregs, RCA large, 20p, 30p/m.  ? PE (pulmonary embolism)   ? Renal  insufficiency   ? ?Past Surgical History:  ?Procedure Laterality Date  ? IRRIGATION AND DEBRIDEMENT ABSCESS Bilateral 10/07/2021  ? Procedure: IRRIGATION AND DEBRIDEMENT ABSCESS BILATERAL LOWER EXTREMITIES;  Surgeon: Algernon Huxley, MD;  Location: [facility] ORS;  Service: Vascular;  Laterality: Bilateral;  ? LEFT HEART CATH AND CORONARY ANGIOGRAPHY N/A 09/30/2019  ? Procedure: LEFT HEART CATH AND CORONARY ANGIOGRAPHY;  Surgeon: Wellington Hampshire, MD;  Location: [facility];  Service: Cardiovascular;  Laterality: N/A;  ? TOTAL HIP ARTHROPLASTY    ? ?Social History:  reports that he has been smoking cigarettes. He has been smoking an average of .5 packs per day. He has never used smokeless tobacco. He reports that he does not drink alcohol and does not use drugs. ? ?Allergies  ?Allergen Reactions  ? Cyclobenzaprine Nausea Only  ? Amlodipine   ? Clonidine Other (See Comments)  ?  Asymptomatic bradycardia to 38  ? Furosemide Other (See Comments)  ?  Caused renal failure  ? Tylenol [Acetaminophen] Other (See Comments)  ?  Reaction:  Pt states it bothers his liver  ? ? ?Family History  ?Problem Relation Age of Onset  ? Hypertension Mother   ? ? ?Prior to Admission medications   ?Medication Sig Start Date End Date Taking? Authorizing Provider  ?albuterol (VENTOLIN HFA) 108 (90 Base) MCG/ACT inhaler Inhale 2 puffs into the lungs every 6 (six) hours as needed for  wheezing or shortness of breath. 01/16/20   Blake Divine, MD  ?apixaban (ELIQUIS) 5 MG TABS tablet Take 1 tablet (5 mg total) by mouth 2 (two) times daily. 01/07/21 10/18/21  Wyvonnia Dusky, MD  ?atorvastatin (LIPITOR) 40 MG tablet Take 1 tablet (40 mg total) by mouth at bedtime. 08/10/21 10/18/21  Fritzi Mandes, MD  ?bumetanide (BUMEX) 2 MG tablet Take 1 tablet (2 mg total) by mouth daily. 10/14/21   Sidney Ace, MD  ?carvedilol (COREG) 6.25 MG tablet Take 6.25 mg by mouth 2 (two) times daily. 10/12/20   [provider]  ?ceFEPime (MAXIPIME) IVPB  Inject 2 g into the vein every Monday, Wednesday, and Friday with hemodialysis for 7 days. To be given at HD ?Indication:  cellulitis ?Last Day of Therapy:  4/19 ?Labs - Once weekly:  CBC/D, CMP, and pre-dialysis vancomycin level. ?Fax weekly labs to [phone] 10/22/21 10/29/21  Fritzi Mandes, MD  ?diclofenac Sodium (VOLTAREN) 1 % GEL Apply 2 g topically 4 (four) times daily.    [provider]  ?naloxone Karma Greaser) nasal spray 4 mg/0.1 mL SMARTSIG:Both Nares 09/22/21   [provider]  ?omeprazole (PRILOSEC) 40 MG capsule Take 40 mg by mouth 2 (two) times daily.    [provider]  ?ondansetron (ZOFRAN) 8 MG tablet TAKE ONE-HALF TABLET BY MOUTH EVERY 8 HOURS AS NEEDED NAUSEA 08/04/21   [provider]  ?oxyCODONE 10 MG TABS Take 1 tablet (10 mg total) by mouth every 12 (twelve) hours as needed for moderate pain. 10/21/21   Fritzi Mandes, MD  ?oxymorphone (OPANA ER) 10 MG 12 hr tablet Take 10 mg by mouth every 12 (twelve) hours.    [provider]  ?polyethylene glycol (MIRALAX / GLYCOLAX) 17 g packet Take 17 g by mouth daily.    [provider]  ?pregabalin (LYRICA) 25 MG capsule Take 25 mg by mouth at bedtime. 09/22/21   [provider]  ?tamsulosin (FLOMAX) 0.4 MG CAPS capsule Take 0.4 mg by mouth daily. 10/13/20   [provider]  ?vancomycin IVPB Inject 1,000 mg into the vein every Monday, Wednesday, and Friday with hemodialysis for 7 days. To be given at HD ?Indication:  cellulitis ?Last Day of Therapy:  4/19 ?Labs - Once weekly:  CBC/D, CMP, and pre-dialysis vancomycin level. ?Fax weekly labs to [phone] 10/22/21 10/29/21  Fritzi Mandes, MD  ? ? ?Physical Exam: ?Vitals:  ? 10/22/21 1910 10/22/21 1911  ?BP: 134/80   ?Pulse: 97   ?Resp: 19   ?Temp: 98.8 ?F (37.1 ?C)   ?TempSrc: Oral   ?SpO2: 94%   ?Weight:  120 kg  ?Height:  6' (1.829 m)  ? ?Physical Exam ?Vitals and nursing note reviewed.  ?Constitutional:   ?   General: He is not in acute  distress. ?HENT:  ?   Head: Normocephalic and atraumatic.  ?Cardiovascular:  ?   Rate and Rhythm: Normal rate and regular rhythm.  ?   Pulses: Normal pulses.  ?   Heart sounds: Murmur heard.  ?Pulmonary:  ?   Effort: Pulmonary effort is normal.  ?   Breath sounds: Normal breath sounds.  ?Abdominal:  ?   Palpations: Abdomen is soft.  ?   Tenderness: There is no abdominal tenderness.  ?Musculoskeletal:  ?   Comments: Bilateral lower extremity edema with multiple superficial all falls with serosanguineous oozing through dressings  ?Neurological:  ?   Mental Status: Mental status is at baseline.  ? ? ? ?Data Reviewed: ?  Relevant notes from primary care and specialist visits, past discharge summaries as available in EHR, including Care Everywhere. ?Prior diagnostic testing as pertinent to current admission diagnoses ?Updated medications and problem lists for reconciliation ?ED course, including vitals, labs, imaging, treatment and response to treatment ?Triage notes, nursing and pharmacy notes and ED provider's notes ?Notable results as noted in HPI ? ? ?Assessment and Plan: ?* Intractable pain ?Pain control keep legs elevated ? ?Unable to care for self ?Patient declined rehab placement during recent hospitalization but both patient and his brother unable to care for him so [facility] consult requested for placement ? ?Bilateral lower leg cellulitis ?Continue IV vancomycin and cefepime during dialysis ?Keep legs elevated ?Per recent wound care recommendation: ?-Daily dressing changes using a silver hydrofiber (Aquacel Ag+ Advantage covered with ABD pads for antimicrobial donation and absorption, and secure with dry boot (Kerlix and ACE) over Unna's boot at this time and change daily.  ?-Pressure redistribution heel boots and pressure redistribution chair cushion for pressure injury prevention when OOB, as the patient sleeps in a recliner chair ? ? ?Venous stasis of both lower extremities ?Keep leg elevated ?Continue Unna boots,  Lasix ?Other management as above ? ?Esophageal varices ([facility]) ?Related to cirrhosis.  No evidence of acute bleeding ? ?Cirrhosis of liver ([place]) ?Not acutely decompensated ? ?HFrEF (heart failure with reduced ejection frac

## 2021-10-22 NOTE — Progress Notes (Addendum)
Pharmacy Antibiotic Note ? ?[name] is a 66 y.o. male admitted on 10/22/2021 with  wound infection .  Pharmacy has been consulted for Vanc, Cefepime dosing.  Pt was d/c from [facility] on 4/13 , was on vanc 1 gm IV MWF-HD and Cefepime 2 gm IV MWF.  Pt was ordered to have vanc and cefepime continued as outpt X 10 days.   MD wants to continue current regimen.  Pt received HD on 4/14 so will assume he received his abx since he is from SNF.  ? ?- Update:  Pt states he received one of his abx on 4/14 with HD but isn't sure which one.  Med rec tech tried contacting [facility] but did not get answer, [facility] was closed.   Will need to try contacting them again on 4/15 to verify which abx he needs. ? ?Plan: ?Vancomycin 1 gm IV Q MWF - HD X 10 days ordered to continue on 4/17 @ 1200. ? ?Cefepime 2 gm IV Q MWF - HD X 10 days ordered to continue on 4/17 @ 1200.  ? ?Height: 6' (182.9 cm) ?Weight: 120 kg (264 lb 8.8 oz) ?IBW/kg (Calculated) : 77.6 ? ?Temp (24hrs), Avg:98.5 ?F (36.9 ?C), Min:98.1 ?F (36.7 ?C), Max:98.8 ?F (37.1 ?C) ? ?Recent Labs  ?Lab 10/18/21 ?0313 10/18/21 ?0924 10/19/21 ?7680 10/20/21 ?0507 10/22/21 ?1926  ?WBC 8.1 8.1 8.2 7.7 14.1*  ?CREATININE 8.53* 8.74* 5.46* 7.09* 4.78*  ?  ?Estimated Creatinine Clearance: 21.2 mL/min (A) (by C-G formula based on SCr of 4.78 mg/dL (H)).   ? ?Allergies  ?Allergen Reactions  ? Cyclobenzaprine Nausea Only  ? Amlodipine   ? Clonidine Other (See Comments)  ?  Asymptomatic bradycardia to 38  ? Furosemide Other (See Comments)  ?  Caused renal failure  ? Tylenol [Acetaminophen] Other (See Comments)  ?  Reaction:  Pt states it bothers his liver  ? ? ?Antimicrobials this admission: ?  >>  ?  >>  ? ?Dose adjustments this admission: ? ? ?Microbiology results: ? BCx:  ? UCx:   ? Sputum:   ? MRSA PCR:  ? ?Thank you for allowing pharmacy to be a part of this patient?s care. ? ?Makylah Bossard D ?10/22/2021 10:39 PM ? ?

## 2021-10-22 NOTE — Assessment & Plan Note (Signed)
Not acutely decompensated 

## 2021-10-22 NOTE — Assessment & Plan Note (Signed)
Related to cirrhosis.  No evidence of acute bleeding ?

## 2021-10-22 NOTE — ED Notes (Signed)
Pt bilateral lower legs swollen and covered in wounds  ? ?

## 2021-10-22 NOTE — Assessment & Plan Note (Addendum)
-   significant deconditioning from last hospitalization ?- now amenable to SNF ?- follow up bed search ?

## 2021-10-22 NOTE — Assessment & Plan Note (Addendum)
-   Nephrology following for continuation of dialysis ?- Schedule is MWF typically ?

## 2021-10-22 NOTE — Assessment & Plan Note (Signed)
Secondary to A-fib and history of PE ?Continue systemic anticoagulation ?

## 2021-10-22 NOTE — Assessment & Plan Note (Addendum)
Keep leg elevated ?Continue Unna boots, Lasix ?Other management as above ?

## 2021-10-22 NOTE — Assessment & Plan Note (Addendum)
Continue apixaban for stroke prevention and A-fib as hemoglobin is stable and no evidence of active bleeding ?

## 2021-10-22 NOTE — Assessment & Plan Note (Signed)
Most recent EF 40 to 45% ?Continue Lasix and carvedilol ?

## 2021-10-22 NOTE — ED Notes (Signed)
Pt put side rail down and was sitting on the side of the bed. This Probation officer and [place] assisted pt back into bed. ?

## 2021-10-23 DIAGNOSIS — L03115 Cellulitis of right lower limb: Principal | ICD-10-CM

## 2021-10-23 DIAGNOSIS — R52 Pain, unspecified: Secondary | ICD-10-CM | POA: Diagnosis not present

## 2021-10-23 DIAGNOSIS — L03116 Cellulitis of left lower limb: Secondary | ICD-10-CM | POA: Diagnosis not present

## 2021-10-23 LAB — CULTURE, BLOOD (ROUTINE X 2)
Culture: NO GROWTH
Culture: NO GROWTH
Special Requests: ADEQUATE

## 2021-10-23 MED ORDER — SODIUM CHLORIDE 0.9 % IV SOLN
2.0000 g | Freq: Once | INTRAVENOUS | Status: AC
  Administered 2021-10-23: 2 g via INTRAVENOUS
  Filled 2021-10-23: qty 12.5

## 2021-10-23 NOTE — Evaluation (Signed)
Physical Therapy Evaluation ?Patient Details ?Name: [name] ?MRN: [id number] ?DOB: [birth year] ?Today's Date: 10/23/2021 ? ?History of Present Illness ? Pt is a 66 y/o M admitted who was recently hospitalized 4/10-4/13 for BLE cellulitis, treated & d/c home after declining SNF. Pt returns 1 day after d/c after brother, whom he lives with, states he is unable to care for pt. Pt c/o ongoing intractable BLE pain 2/2 cellulitis. PMH: ESRD on HD MWF, HTN, a-fib on eliquis, cirrhosis with esophageal varices, HFrEF (EF 40-45%), chronic venous stasism depression  ?Clinical Impression ? Pt seen for PT evaluation with OT arriving for co-tx 2/2 pt's decreased willingness to participate/tolerate 2 sessions 2/2 BLE pain. Pt requires min assist for supine>sit with heavy reliance on HOB elevated & bed rails. Pt repeatedly states he cannot walk & asks for pain medication 2/2 excruciating pain despite pt being received sound asleep upon PT arrival - nurse notified. Pt is eventually able to transfer STS with mod assist +2 fade to min assist +2 with cuing but poor demo of safe hand placement. After stepping to recliner pt endorses more pain so pt left in recliner with BLE elevated. Pt would benefit from STR upon d/c to maximize independence with functional mobility & reduce fall risk prior to return home. Will continue to follow pt acutely to address strength, endurance, and gait with LRAD. ?   ?   ? ?Recommendations for follow up therapy are one component of a multi-disciplinary discharge planning process, led by the attending physician.  Recommendations may be updated based on patient status, additional functional criteria and insurance authorization. ? ?Follow Up Recommendations Skilled nursing-short term rehab (<3 hours/day) ? ?  ?Assistance Recommended at Discharge Frequent or constant Supervision/Assistance  ?Patient can return home with the following ? A little help with walking and/or transfers;A little help with  bathing/dressing/bathroom;Assistance with cooking/housework;Assist for transportation;Help with stairs or ramp for entrance ? ?  ?Equipment Recommendations Rolling walker (2 wheels)  ?Recommendations for Other Services ?    ?  ?Functional Status Assessment Patient has had a recent decline in their functional status and demonstrates the ability to make significant improvements in function in a reasonable and predictable amount of time.  ? ?  ?Precautions / Restrictions Precautions ?Precautions: Fall ?Restrictions ?Weight Bearing Restrictions: No  ? ?  ? ?Mobility ? Bed Mobility ?Overal bed mobility: Needs Assistance ?Bed Mobility: Supine to Sit ?  ?  ?Supine to sit: Min assist, HOB elevated (cuing for sequencing, assistance to move BLE to EOB, cuing to use bed rails to upright posture & scoot to sitting EOB) ?  ?  ?  ?  ? ?Transfers ?Overall transfer level: Needs assistance ?Equipment used: Rolling walker (2 wheels) ?Transfers: Sit to/from Stand, Bed to chair/wheelchair/BSC ?Sit to Stand:  (initially mod assist +2 for STS from elevated EOB, fade to min assist +2 with cuing but poor demo of safe hand placement to push to standing) ?  ?Step pivot transfers: Min assist, +2 physical assistance ?  ?  ?  ?  ?  ? ?Ambulation/Gait ?  ?  ?  ?  ?  ?  ?  ?  ? ?Stairs ?  ?  ?  ?  ?  ? ?Wheelchair Mobility ?  ? ?Modified Rankin (Stroke Patients Only) ?  ? ?  ? ?Balance Overall balance assessment: Needs assistance ?Sitting-balance support: Feet supported, Bilateral upper extremity supported ?Sitting balance-Leahy Scale: Fair ?Sitting balance - Comments: supervision for static sitting ?  ?Standing  balance support: During functional activity, Bilateral upper extremity supported, Reliant on assistive device for balance ?Standing balance-Leahy Scale: Fair ?  ?  ?  ?  ?  ?  ?  ?  ?  ?  ?  ?  ?   ? ? ? ?Pertinent Vitals/Pain Pain Assessment ?Pain Assessment: Faces ?Faces Pain Scale: Hurts whole lot ?Pain Location: BLE ?Pain Descriptors  / Indicators: Aching, Constant, Discomfort, Grimacing, Moaning ?Pain Intervention(s): Limited activity within patient's tolerance, Monitored during session, Relaxation, Patient requesting pain meds-RN notified  ? ? ?Home Living Family/patient expects to be discharged to:: Private residence ?Living Arrangements: Other (Comment) (chart reports pt lives with brother but pt reports he lives with his mother & father) ?Available Help at Discharge: Family;Available PRN/intermittently ?Type of Home: House ?Home Access: Ramped entrance ?  ?  ?  ?Home Layout: One level ?Home Equipment: Rollator (4 wheels);Grab bars - tub/shower;Shower seat ?Additional Comments: pt questionable historian during eval, home set up information gathered via chart review  ?  ?Prior Function Prior Level of Function : Needs assist ?  ?  ?  ?Physical Assist : Mobility (physical);ADLs (physical) ?  ?  ?Mobility Comments: Endorses baseline mobility with SPC; however, recently at [facility] for [facility], reports tolerating/participating with minimal gait efforts (from chart) ?ADLs Comments: Pt with recent discharge from hospital- assist required at that time for all ADL/IADL (from chart) ?  ? ? ?Hand Dominance  ?   ? ?  ?Extremity/Trunk Assessment  ? Upper Extremity Assessment ?Upper Extremity Assessment: Overall WFL for tasks assessed ?  ? ?Lower Extremity Assessment ?Lower Extremity Assessment: Generalized weakness (not formally tested 2/2 draining open wounds on BLE, grossly 3+/5 as pt able to weight bear without buckling noted, limited ROM 2/2 edema) ?  ? ?   ?Communication  ? Communication: No difficulties  ?Cognition Arousal/Alertness: Awake/alert ?Behavior During Therapy: Anxious, WFL for tasks assessed/performed (pt with decreased motivation & willingness to participate) ?Overall Cognitive Status: Within Functional Limits for tasks assessed ?  ?  ?  ?  ?  ?  ?  ?  ?  ?  ?  ?  ?  ?  ?  ?  ?General Comments: Requires encouragement & education for  participation ?  ?  ? ?  ?General Comments General comments (skin integrity, edema, etc.): Pt with incontinent BM smear but pt unaware, requires total assist for peri hygiene. ? ?  ?Exercises    ? ?Assessment/Plan  ?  ?PT Assessment Patient needs continued PT services  ?PT Problem List Decreased strength;Decreased mobility;Decreased safety awareness;Decreased range of motion;Decreased coordination;Decreased skin integrity;Decreased activity tolerance;Decreased balance;Pain;Decreased knowledge of use of DME ? ?   ?  ?PT Treatment Interventions DME instruction;Therapeutic exercise;Gait training;Balance training;Stair training;Neuromuscular re-education;Functional mobility training;Therapeutic activities;Patient/family education   ? ?PT Goals (Current goals can be found in the Care Plan section)  ?Acute Rehab PT Goals ?Patient Stated Goal: decreased pain, get pain medicine ?PT Goal Formulation: With patient ?Time For Goal Achievement: 11/06/21 ?Potential to Achieve Goals: Fair ? ?  ?Frequency Min 2X/week ?  ? ? ?Co-evaluation PT/OT/SLP Co-Evaluation/Treatment: Yes ?Reason for Co-Treatment: Other (comment) (pain limiting pt's participation) ?PT goals addressed during session: Mobility/safety with mobility;Balance;Proper use of DME ?OT goals addressed during session: ADL's and self-care ?  ? ? ?  ?AM-PAC PT "6 Clicks" Mobility  ?Outcome Measure Help needed turning from your back to your side while in a flat bed without using bedrails?: None ?Help needed moving from lying on your back  to sitting on the side of a flat bed without using bedrails?: A Lot ?Help needed moving to and from a bed to a chair (including a wheelchair)?: A Little ?Help needed standing up from a chair using your arms (e.g., wheelchair or bedside chair)?: A Little ?Help needed to walk in hospital room?: A Little ?Help needed climbing 3-5 steps with a railing? : A Lot ?6 Click Score: 17 ? ?  ?End of Session   ?Activity Tolerance: Patient limited by  pain ?Patient left: in chair;with chair alarm set;with call bell/phone within reach ?Nurse Communication: Mobility status (pt's request for pain medication) ?PT Visit Diagnosis: Unsteadiness on feet (R26.81);Pain;Muscle

## 2021-10-23 NOTE — Progress Notes (Signed)
Pharmacy Antibiotic Note ? ?[name] is a 66 y.o. male admitted on 10/22/2021 with  wound infection .  Pharmacy has been consulted for Vanc, Cefepime dosing.  Pt was d/c from [facility] on 4/13 , was on vanc 1 gm IV MWF-HD and Cefepime 2 gm IV MWF.  Pt was ordered to have vanc and cefepime continued as outpt X 10 days.   MD wants to continue current regimen.  Pt received HD on 4/14 so will assume he received his abx since he is from SNF.  ? ?- Update:  Pt states he received one of his abx on 4/14 with HD but isn't sure which one.  Med rec tech tried contacting [facility] but did not get answer, [facility] was closed.   Will need to try contacting them again on 4/15 to verify which abx he needs. ? ?4/15: Called Dialysis center 4/15 AM: Patient only received Vancomycin dose 4/14 AM during HD session. Will order one time cefepime 2 gram for 4/15 and continue previous regimen.  ? ?Plan: ?Vancomycin 1 gm IV Q MWF - HD X 10 days ordered to continue on 4/17 @ 1200. ? ?Cefepime 2 gm IV Q MWF - HD X 10 days ordered to continue on 4/17 @ 1200.  ? ?Height: 6' (182.9 cm) ?Weight: 120 kg (264 lb 8.8 oz) ?IBW/kg (Calculated) : 77.6 ? ?Temp (24hrs), Avg:98.2 ?F (36.8 ?C), Min:97.7 ?F (36.5 ?C), Max:98.8 ?F (37.1 ?C) ? ?Recent Labs  ?Lab 10/18/21 ?0313 10/18/21 ?0924 10/19/21 ?0034 10/20/21 ?0507 10/22/21 ?1926  ?WBC 8.1 8.1 8.2 7.7 14.1*  ?CREATININE 8.53* 8.74* 5.46* 7.09* 4.78*  ? ?  ?Estimated Creatinine Clearance: 21.2 mL/min (A) (by C-G formula based on SCr of 4.78 mg/dL (H)).   ? ?Allergies  ?Allergen Reactions  ? Cyclobenzaprine Nausea Only  ? Amlodipine   ? Clonidine Other (See Comments)  ?  Asymptomatic bradycardia to 38  ? Furosemide Other (See Comments)  ?  Caused renal failure  ? Tylenol [Acetaminophen] Other (See Comments)  ?  Reaction:  Pt states it bothers his liver  ? ? ?Antimicrobials this admission: ?4/15  >> cefepime ? 4/15 >> Vancomycin ? ?Dose adjustments this admission: ? ? ?Microbiology  results: ?- see previous admission ? ?Thank you for allowing pharmacy to be a part of this patient?s care. ? ?Dorothe Pea, PharmD, BCPS ?Clinical Pharmacist   ?10/23/2021 8:53 AM ? ?

## 2021-10-23 NOTE — Progress Notes (Signed)
?Progress Note ? ? ? ?[name]   ?KKX:[id number]  ?DOB: 03-19-1956  ?DOA: 10/22/2021     1 ?[facility]P: [facility] [place] ? ?Initial CC: Leg pain and weakness ? ?Hospital Course: ?[name] is a 66 y.o. male with medical history significant for ESRD on HD MWF, HTN, A-fib on Eliquis, cirrhosis with esophageal varices, HFrEF (EF 40 to 45%) chronic venous stasis, recently hospitalized from 4/10 to 4/13 for bilateral lower extremity cellulitis, treated with IV vancomycin and cefepime (end date 4/20) and discharged home with home health. ?He returned back to the hospital after falling at home and being unable to get up and too much work for his brother. ?He was having ongoing intractable leg pain related to his cellulitis. ?He was restarted back on his antibiotics and admitted for probable placement in rehab. ? ?Interval History:  ?Readmitted after just being discharged.  He had fallen at home and was unable to get up adequately and his brother is unable to continue caring for him.  He understands he will need to go for rehab after discharge this hospitalization.  He continues to endorse ongoing severe pain in his legs. ? ?Assessment and Plan: ?* Intractable pain ?Pain control keep legs elevated ? ?Bilateral lower leg cellulitis ?- Continue IV vancomycin and cefepime as per regimen; end date 4/20 per last ID note ?- Keep legs elevated ?- Per recent wound care recommendation: ?Daily dressing changes using a silver hydrofiber (Aquacel Ag+ Advantage covered with ABD pads for antimicrobial donation and absorption, and secure with dry boot (Kerlix and ACE) over Unna's boot at this time and change daily.  ?-Pressure redistribution heel boots and pressure redistribution chair cushion for pressure injury prevention when OOB, as the patient sleeps in a recliner chair ? ? ?Unable to care for self ?Patient declined rehab placement during recent hospitalization but both patient and his brother unable to care for him  so [facility] consult requested for placement ? ?Venous stasis of both lower extremities ?Keep leg elevated ?Continue Unna boots, Lasix ?Other management as above ? ?Esophageal varices (HCC) ?Related to cirrhosis.  No evidence of acute bleeding ? ?Cirrhosis of liver ([place]) ?Not acutely decompensated ? ?HFrEF (heart failure with reduced ejection fraction) ([place]) ?Most recent EF 40 to 45% ?Continue Lasix and carvedilol ? ?AF (paroxysmal atrial fibrillation) ([place]) ?Continue carvedilol and apixaban ? ?Chronic anticoagulation ?Continue apixaban for stroke prevention and A-fib as hemoglobin is stable and no evidence of active bleeding ? ?Anemia of chronic disease ?Hemoglobin at baseline and stable ? ?Acute respiratory failure with hypoxia ([place]) ?Secondary to A-fib and history of PE ?Continue systemic anticoagulation ? ?ESRD on dialysis [facility]) ?Nephrology consult for continuation of dialysis ? ? ?Old records reviewed in assessment of this patient ? ?Antimicrobials: ?Vanc ?Cefepime ? ?DVT prophylaxis:  ? ?apixaban (ELIQUIS) tablet 5 mg  ? ?Code Status:   Code Status: Full Code ? ?Disposition Plan:  SNF ?Status is: Inpt ? ?Objective: ?Blood pressure 126/66, pulse 82, temperature 98.1 ?F (36.7 ?C), temperature source Oral, resp. rate 18, height 6' (1.829 m), weight 120 kg, SpO2 91 %.  ?Examination:  ?Physical Exam ?Constitutional:   ?   General: He is not in acute distress. ?   Comments: Chronically ill-appearing  ?HENT:  ?   Head: Normocephalic and atraumatic.  ?   Mouth/Throat:  ?   Mouth: Mucous membranes are moist.  ?Eyes:  ?   Extraocular Movements: Extraocular movements intact.  ?Cardiovascular:  ?   Rate and Rhythm:  Normal rate and regular rhythm.  ?   Heart sounds: Normal heart sounds.  ?Pulmonary:  ?   Effort: Pulmonary effort is normal. No respiratory distress.  ?   Breath sounds: Normal breath sounds. No wheezing.  ?Abdominal:  ?   General: Bowel sounds are normal. There is no distension.  ?   Palpations: Abdomen is soft.  ?    Tenderness: There is no abdominal tenderness.  ?Musculoskeletal:  ?   Cervical back: Normal range of motion and neck supple.  ?   Comments: 3+ bilateral lower extremity pitting edema up to knees  ?Skin: ?   Comments: Wound dressings in place with scattered skin excoriations and purulent drainage  ?Neurological:  ?   General: No focal deficit present.  ?   Mental Status: He is alert.  ?Psychiatric:     ?   Mood and Affect: Mood normal.     ?   Behavior: Behavior normal.  ?  ? ?Consultants:  ?Nephrology ? ?Procedures:  ? ? ?Data Reviewed: ?Results for orders placed or performed during the hospital encounter of 10/22/21 (from the past 24 hour(s))  ?CBC with Differential/Platelet     Status: Abnormal  ? Collection Time: 10/22/21  7:26 PM  ?Result Value Ref Range  ? WBC 14.1 (H) 4.0 - 10.5 K/uL  ? RBC 3.00 (L) 4.22 - 5.81 MIL/uL  ? Hemoglobin 8.4 (L) 13.0 - 17.0 g/dL  ? HCT 26.7 (L) 39.0 - 52.0 %  ? MCV 89.0 80.0 - 100.0 fL  ? MCH 28.0 26.0 - 34.0 pg  ? MCHC 31.5 30.0 - 36.0 g/dL  ? RDW 16.1 (H) 11.5 - 15.5 %  ? Platelets 139 (L) 150 - 400 K/uL  ? nRBC 0.0 0.0 - 0.2 %  ? Neutrophils Relative % 85 %  ? Neutro Abs 12.1 (H) 1.7 - 7.7 K/uL  ? Lymphocytes Relative 5 %  ? Lymphs Abs 0.7 0.7 - 4.0 K/uL  ? Monocytes Relative 8 %  ? Monocytes Absolute 1.1 (H) 0.1 - 1.0 K/uL  ? Eosinophils Relative 1 %  ? Eosinophils Absolute 0.1 0.0 - 0.5 K/uL  ? Basophils Relative 0 %  ? Basophils Absolute 0.0 0.0 - 0.1 K/uL  ? Immature Granulocytes 1 %  ? Abs Immature Granulocytes 0.07 0.00 - 0.07 K/uL  ?Basic metabolic panel     Status: Abnormal  ? Collection Time: 10/22/21  7:26 PM  ?Result Value Ref Range  ? Sodium 140 135 - 145 mmol/L  ? Potassium 4.0 3.5 - 5.1 mmol/L  ? Chloride 101 98 - 111 mmol/L  ? CO2 28 22 - 32 mmol/L  ? Glucose, Bld 120 (H) 70 - 99 mg/dL  ? BUN 27 (H) 8 - 23 mg/dL  ? Creatinine, Ser 4.78 (H) 0.61 - 1.24 mg/dL  ? Calcium 8.6 (L) 8.9 - 10.3 mg/dL  ? GFR, Estimated 13 (L) >60 mL/min  ? Anion gap 11 5 - 15  ?  ?I have  Reviewed nursing notes, Vitals, and Lab results since pt's last encounter. Pertinent lab results : see above ?I have ordered test including BMP, CBC, Mg ?I have reviewed the last note from staff over past 24 hours ?I have discussed pt's care plan and test results with nursing staff, case manager ? ? LOS: 1 day  ? ?Dwyane Dee, MD ?[facility] ?10/23/2021, 12:18 PM ? ?

## 2021-10-23 NOTE — Evaluation (Signed)
Occupational Therapy Evaluation ?Patient Details ?Name: [name] ?MRN: [id number] ?DOB: May 24, 1956 ?Today's Date: 10/23/2021 ? ? ?History of Present Illness Pt is a 66 year old male admitted after discharging home 1 day ago with brother with intractable leg pain related to his cellulitis.  PMH significant for medical history significant for ESRD on HD MWF, HTN, A-fib on Eliquis, cirrhosis with esophageal varices, HFrEF (EF 40 to 45%) chronic venous stasis. Of not pt was recently hospitalized from 4/10 to 4/13 for bilateral lower extremity cellulitis, treated with IV vancomycin and cefepime and discharged home with home health after declining SNF  ? ?Clinical Impression ?  ?Chart reviewed to date, Co tx completed with PT on this date. Pt recently discharged from hospital and presents on this date with increased assist required for mobility/ADL tasks as compared to previous admission. Pt is limited by pain at this time. MOD A +2 required for STS 1 attempt, MIN A +2 for additional attempt. TOTAL A required for peri care. Step by step vcs required for all task completion on this date. At this time, pt would benefit from STR to address functional deficits. OT will continue to follow while admitetd.  ?   ? ?Recommendations for follow up therapy are one component of a multi-disciplinary discharge planning process, led by the attending physician.  Recommendations may be updated based on patient status, additional functional criteria and insurance authorization.  ? ?Follow Up Recommendations ? Skilled nursing-short term rehab (<3 hours/day)  ?  ?Assistance Recommended at Discharge Intermittent Supervision/Assistance  ?Patient can return home with the following A lot of help with bathing/dressing/bathroom;Assistance with cooking/housework;A lot of help with walking and/or transfers ? ?  ?Functional Status Assessment ? Patient has had a recent decline in their functional status and demonstrates the ability to make  significant improvements in function in a reasonable and predictable amount of time.  ?Equipment Recommendations ? None recommended by OT  ?  ?Recommendations for Other Services   ? ? ?  ?Precautions / Restrictions Precautions ?Precautions: Fall ?Restrictions ?Weight Bearing Restrictions: No  ? ?  ? ?Mobility Bed Mobility ?  ?  ?  ?  ?  ?  ?  ?General bed mobility comments: NT pt at edge of bed with PT at start of session ?  ? ?Transfers ?Overall transfer level: Needs assistance ?Equipment used: Rolling walker (2 wheels) ?Transfers: Sit to/from Stand ?Sit to Stand: Mod assist, +2 physical assistance, Min assist ?  ?  ?  ?  ?  ?General transfer comment: STS MOD A +2 one attempt, MIN A+2 one attempt, step by step cueing required ?  ? ?  ?Balance Overall balance assessment: Needs assistance ?Sitting-balance support: No upper extremity supported, Feet supported ?Sitting balance-Leahy Scale: Good ?  ?  ?Standing balance support: During functional activity, Bilateral upper extremity supported, Reliant on assistive device for balance ?Standing balance-Leahy Scale: Fair ?  ?  ?  ?  ?  ?  ?  ?  ?  ?  ?  ?  ?   ? ?ADL either performed or assessed with clinical judgement  ? ?ADL Overall ADL's : Needs assistance/impaired ?  ?  ?Grooming: Wash/dry face;Sitting;Set up ?  ?  ?  ?Lower Body Bathing: Maximal assistance;Total assistance;Sit to/from stand ?  ?Upper Body Dressing : Minimal assistance;Sitting ?Upper Body Dressing Details (indicate cue type and reason): gown ?Lower Body Dressing: Maximal assistance ?Lower Body Dressing Details (indicate cue type and reason): shoes due to reported pain ?Toilet  Transfer: Minimal assistance;+2 for physical assistance;Cueing for safety;Cueing for sequencing;Stand-pivot ?Toilet Transfer Details (indicate cue type and reason): simulated to bedside chair ?Toileting- Clothing Manipulation and Hygiene: Total assistance ?Toileting - Clothing Manipulation Details (indicate cue type and reason): for  BM ?  ?  ?Functional mobility during ADLs: Minimal assistance;Rolling walker (2 wheels) ?   ? ? ? ?Vision Patient Visual Report: No change from baseline ?   ?   ?Perception   ?  ?Praxis   ?  ? ?Pertinent Vitals/Pain Pain Assessment ?Pain Assessment: Faces ?Faces Pain Scale: Hurts whole lot ?Pain Location: BLE ?Pain Descriptors / Indicators: Aching, Constant, Discomfort, Grimacing, Moaning ?Pain Intervention(s): Limited activity within patient's tolerance, Monitored during session, Repositioned, Patient requesting pain meds-RN notified  ? ? ? ?Hand Dominance   ?  ?Extremity/Trunk Assessment Upper Extremity Assessment ?Upper Extremity Assessment: Overall WFL for tasks assessed ?  ?Lower Extremity Assessment ?Lower Extremity Assessment: Generalized weakness ?  ?  ?  ?Communication Communication ?Communication: No difficulties ?  ?Cognition Arousal/Alertness: Awake/alert ?Behavior During Therapy: Agitated, WFL for tasks assessed/performed ?Overall Cognitive Status: Within Functional Limits for tasks assessed ?  ?  ?  ?  ?  ?  ?  ?  ?  ?  ?  ?  ?  ?  ?  ?  ?General Comments: Pt requires step by step verbal cueing for participation in mobility related tasks ?  ?  ?General Comments  pt with drainage from B legs, bed pad soiled prior to OT session starting. Pt with wounds throughout BLE. ? ?  ?Exercises Other Exercises ?Other Exercises: edu re: role of OT, importance of OOB mobility and participation in ADL tasks ?  ?Shoulder Instructions    ? ? ?Home Living Family/patient expects to be discharged to:: Private residence ?Living Arrangements: Other (Comment) (brother) ?Available Help at Discharge: Family;Available PRN/intermittently ?Type of Home: House ?Home Access: Ramped entrance ?  ?  ?Home Layout: One level ?  ?  ?Bathroom Shower/Tub: Walk-in shower ?  ?Bathroom Toilet: Standard ?  ?  ?Home Equipment: Rollator (4 wheels);Grab bars - tub/shower;Shower seat ?  ?Additional Comments: pt questionable historian during eval,  home set up information gathered via chart review ?  ? ?  ?Prior Functioning/Environment Prior Level of Function : Needs assist ?  ?  ?  ?Physical Assist : Mobility (physical);ADLs (physical) ?  ?  ?  ?ADLs Comments: Pt with recent discharge from hospital- assist required at that time for all ADL/IADL ?  ? ?  ?  ?OT Problem List: Decreased strength;Decreased activity tolerance;Impaired balance (sitting and/or standing);Pain ?  ?   ?OT Treatment/Interventions: Self-care/ADL training;Therapeutic exercise;DME and/or AE instruction;Therapeutic activities;Patient/family education;Balance training  ?  ?OT Goals(Current goals can be found in the care plan section) Acute Rehab OT Goals ?Patient Stated Goal: decrease pain ?OT Goal Formulation: With patient ?Time For Goal Achievement: 11/06/21 ?Potential to Achieve Goals: Fair ?ADL Goals ?Pt Will Perform Lower Body Dressing: with mod assist;sit to/from stand;with adaptive equipment ?Pt Will Transfer to Toilet: with min guard assist;ambulating;bedside commode ?Pt Will Perform Toileting - Clothing Manipulation and hygiene: with min guard assist;sit to/from stand  ?OT Frequency: Min 2X/week ?  ? ?Co-evaluation PT/OT/SLP Co-Evaluation/Treatment: Yes ?Reason for Co-Treatment: Necessary to address cognition/behavior during functional activity;For patient/therapist safety ?  ?OT goals addressed during session: ADL's and self-care ?  ? ?  ?AM-PAC OT "6 Clicks" Daily Activity     ?Outcome Measure Help from another person eating meals?: None ?Help from another person  taking care of personal grooming?: A Little ?Help from another person toileting, which includes using toliet, bedpan, or urinal?: Total ?Help from another person bathing (including washing, rinsing, drying)?: A Lot ?Help from another person to put on and taking off regular upper body clothing?: A Little ?Help from another person to put on and taking off regular lower body clothing?: A Lot ?6 Click Score: 15 ?  ?End of  Session Equipment Utilized During Treatment: Rolling walker (2 wheels) ?Nurse Communication: Mobility status ? ?Activity Tolerance: Patient limited by pain ?Patient left: in chair;with call bell/phone within re

## 2021-10-23 NOTE — Progress Notes (Signed)
Vitals entered manually ° °

## 2021-10-23 NOTE — Plan of Care (Signed)
RN asked for assistance with a dressing achange on this patient.  Patient had been refusing dressing changes d/t it being too painful on has bilat LE wounds.  This RN found dry dressings stuck to wounds which was literally pulling his skin if attempted to remove dressings.  ? ?IV pain meds were given to assist patient with pre-dressing change.  Old dressings were saturated with NS before attempting to remove dressings.  Old dressings were completely removed prior to applying new dressings. ? ?Petrolium gauze was appied directly to all wounds to prevent further sticking.  ABD pads placed on top and then wrapped completely in guaze wrap.  Socks re-applied and slits cut in top - to keep from digging in.  ?

## 2021-10-23 NOTE — Hospital Course (Signed)
[name] is a 66 y.o. male with medical history significant for ESRD on HD MWF, HTN, A-fib on Eliquis, cirrhosis with esophageal varices, HFrEF (EF 40 to 45%) chronic venous stasis, recently hospitalized from 4/10 to 4/13 for bilateral lower extremity cellulitis, treated with IV vancomycin and cefepime (end date 4/20) and discharged home with home health. ?He returned back to the hospital after falling at home and being unable to get up and too much work for his brother. ?He was having ongoing intractable leg pain related to his cellulitis. ?He was restarted back on his antibiotics and admitted for probable placement in rehab. ?

## 2021-10-23 NOTE — Progress Notes (Signed)
[facility] ?[place], [place] ?10/23/21 ? ?Subjective:  ? ?Hospital day # 1 ? ?[name] 66 year old African-American male with past medical history including persistent atrial fibrillation on Eliquis, hypertension, hyperlipidemia, GERD, BPH, and end-stage renal disease on hemodialysis.  Patient presents to the emergency department from [facility] with complaints of right lower extremity pain and purulent drainage for 2 days.  Patient has been admitted for Bilateral lower leg cellulitis [L03.116, L03.115] ? ?Patient is known to our practice and receives outpatient dialysis treatments at this [facility] on a Monday Wednesday Friday schedule.   ? ?Resting quietly in bed ?No acute events reported ?Patient was brought back to the hospital because family was unable to care for him at home. ? ? ? ?Objective:  ?Vital signs in last 24 hours:  ?Temp:  [97.7 ?F (36.5 ?C)-98.8 ?F (37.1 ?C)] 97.7 ?F (36.5 ?C) (04/15 0810) ?Pulse Rate:  [72-97] 86 (04/15 0810) ?Resp:  [16-21] 18 (04/15 0810) ?BP: (107-142)/(66-80) 124/76 (04/15 0810) ?SpO2:  [94 %-100 %] 99 % (04/15 0810) ?Weight:  [120 kg] 120 kg (04/14 1911) ? ?Weight change:  ?Filed Weights  ? 10/22/21 1911  ?Weight: 120 kg  ? ? ?Intake/Output: ?  ? ?Intake/Output Summary (Last 24 hours) at 10/23/2021 1144 ?Last data filed at 10/23/2021 1100 ?Gross per 24 hour  ?Intake 500 ml  ?Output --  ?Net 500 ml  ? ? ? ? ?Physical Exam: ?General: NAD  ?HEENT Anicteric, moist oral mucous membranes  ?Pulm/lungs Normal breathing effort, [place] O2  ?CVS/Heart No rub or gallop, irregular  ?Abdomen:  Soft  ?Extremities: 2+  pitting edema bilateral lower extremities,  ?Neurologic: Resting quietly  ?Skin: Bilateral lower extremity wounds covered  ?Access: Left upper arm AV fistula  ?   ? ? ?Basic Metabolic Panel: ? ?Recent Labs  ?Lab 10/18/21 ?0313 10/18/21 ?2458 10/18/21 ?0998 10/19/21 ?3382 10/20/21 ?0507 10/22/21 ?1926  ?NA 135  --  136 137 137 140  ?K  4.9  --  5.0 4.2 4.2 4.0  ?CL 96*  --  96* 96* 97* 101  ?CO2 27  --  26 28 28 28   ?GLUCOSE 98  --  107* 95 106* 120*  ?BUN 61*  --  64* 36* 50* 27*  ?CREATININE 8.53*  --  8.74* 5.46* 7.09* 4.78*  ?CALCIUM 8.7*  --  8.5* 8.3* 8.2* 8.6*  ?MG  --  2.1  --   --   --   --   ?PHOS  --  6.5*  --   --   --   --   ? ? ? ? ?CBC: ?Recent Labs  ?Lab 10/18/21 ?0313 10/18/21 ?0924 10/19/21 ?5053 10/20/21 ?0507 10/22/21 ?1926  ?WBC 8.1 8.1 8.2 7.7 14.1*  ?NEUTROABS 5.6 5.9 5.5 4.9 12.1*  ?HGB 8.7* 8.2* 8.4* 8.1* 8.4*  ?HCT 27.3* 25.4* 26.0* 25.5* 26.7*  ?MCV 88.3 88.8 87.2 87.3 89.0  ?PLT 136* 126* 119* 120* 139*  ? ? ? ?  ?Lab Results  ?Component Value Date  ? HEPBSAG NON REACTIVE 10/21/2021  ? HEPBSAB Reactive (A) 10/21/2021  ? HEPBIGM Negative 11/15/2018  ? ? ? ? ?Microbiology: ? ?Recent Results (from the past 240 hour(s))  ?CULTURE, BLOOD (ROUTINE X 2) w Reflex to ID Panel     Status: None  ? Collection Time: 10/18/21  4:39 AM  ? Specimen: Right Antecubital; Blood  ?Result Value Ref Range Status  ? Specimen Description RIGHT ANTECUBITAL  Final  ? Special Requests  Final  ?  BOTTLES DRAWN AEROBIC AND ANAEROBIC Blood Culture adequate volume  ? Culture   Final  ?  NO GROWTH 5 DAYS ?Performed at [facility], [address], [place], [place] [address] ?  ? Report Status 10/23/2021 FINAL  Final  ?CULTURE, BLOOD (ROUTINE X 2) w Reflex to ID Panel     Status: None  ? Collection Time: 10/18/21  4:44 AM  ? Specimen: BLOOD RIGHT FOREARM  ?Result Value Ref Range Status  ? Specimen Description BLOOD RIGHT FOREARM  Final  ? Special Requests   Final  ?  BOTTLES DRAWN AEROBIC AND ANAEROBIC Blood Culture results may not be optimal due to an excessive volume of blood received in culture bottles  ? Culture   Final  ?  NO GROWTH 5 DAYS ?Performed at [facility], [address] [place], [place] [address] ?  ? Report Status 10/23/2021 FINAL  Final  ?Aerobic Culture w Gram Stain (superficial specimen)     Status: None  (Preliminary result)  ? Collection Time: 10/20/21  2:29 PM  ? Specimen: Wound  ?Result Value Ref Range Status  ? Specimen Description   Final  ?  WOUND RL ?Performed at [facility], [address] [place], [place] [address] ?  ? Special Requests   Final  ?  NONE ?Performed at [facility], [address] [place], [place] [address] ?  ? Gram Stain PENDING  Incomplete  ? Culture   Final  ?  MODERATE PSEUDOMONAS AERUGINOSA ?SUSCEPTIBILITIES TO FOLLOW ?Performed at [facility], [place] [address] [place], [place] [address] ?  ? Report Status PENDING  Incomplete  ? ? ? ?Coagulation Studies: ?No results for input(s): LABPROT, INR in the last 72 hours. ? ?Urinalysis: ?No results for input(s): COLORURINE, LABSPEC, [place], GLUCOSEU, HGBUR, BILIRUBINUR, KETONESUR, PROTEINUR, UROBILINOGEN, NITRITE, LEUKOCYTESUR in the last 72 hours. ? ?Invalid input(s): APPERANCEUR  ? ? ?Imaging: ?No results found. ? ? ?Medications:  ? ? [START ON 10/25/2021] ceFEPime (MAXIPIME) IV    ? [START ON 10/25/2021] vancomycin    ? ? apixaban  5 mg Oral BID  ? atorvastatin  40 mg Oral QHS  ? bumetanide  2 mg Oral Daily  ? carvedilol  6.25 mg Oral BID  ? polyethylene glycol  17 g Oral Daily  ? tamsulosin  0.4 mg Oral Daily  ? ?acetaminophen **OR** acetaminophen, albuterol, HYDROmorphone (DILAUDID) injection, ondansetron **OR** ondansetron (ZOFRAN) IV, oxyCODONE ? ?Assessment/ Plan:  ?66 y.o. male with end-stage renal disease, hypertension, atrial fibrillation, coronary artery disease, pulmonary embolism, hyperlipidemia, polysubstance abuse, history of chronic pain syndrome ?Admission at [facility] for acute respiratory failure, COVID-19  in January 2023 ?This time he is admitted on 10/22/2021 for ?Bilateral lower leg cellulitis [L03.116, L03.115] ? ?[place]  240 minutes.  Left arm AV fistula.  15-gauge needles.  3 times per week.  MWF.  Target weight 120 kg. ? ?#End-stage renal disease with LE edema ?We will plan  for next hemodialysis treatment on Monday ? ? ?#Anemia of chronic kidney disease ?Lab Results  ?Component Value Date  ? HGB 8.4 (L) 10/22/2021  ?Patient to resume Mircera as an outpatient. ?Plan for Epogen during this hospitalization ? ?#Secondary hyperparathyroidism ?Lab Results  ?Component Value Date  ? PTH 758 (H) 08/15/2018  ? CALCIUM 8.6 (L) 10/22/2021  ? PHOS 6.5 (H) 10/18/2021  ?Low phosphorus diet recommended ? ?#Right lower extremity wound drainage possible cellulitis ?Bactrim single strength twice daily  ordered outpatient.  Currently receiving cefepime daily and vancomycin IV with dialysis. ? ID following and recommending Vancomycin and Ceftazidine  End date is 10/28/2021. ? ? LOS: 1 ?[name] ?4/15/202311:44 AM ? ?[facility] ?[place], [place] ?[phone] ? ?

## 2021-10-24 DIAGNOSIS — Z992 Dependence on renal dialysis: Secondary | ICD-10-CM

## 2021-10-24 DIAGNOSIS — L03115 Cellulitis of right lower limb: Secondary | ICD-10-CM | POA: Diagnosis not present

## 2021-10-24 DIAGNOSIS — I480 Paroxysmal atrial fibrillation: Secondary | ICD-10-CM | POA: Diagnosis not present

## 2021-10-24 DIAGNOSIS — N186 End stage renal disease: Secondary | ICD-10-CM

## 2021-10-24 DIAGNOSIS — L03116 Cellulitis of left lower limb: Secondary | ICD-10-CM | POA: Diagnosis not present

## 2021-10-24 LAB — AEROBIC CULTURE W GRAM STAIN (SUPERFICIAL SPECIMEN)

## 2021-10-24 MED ORDER — HEPARIN SODIUM (PORCINE) 1000 UNIT/ML DIALYSIS
20.0000 [IU]/kg | INTRAMUSCULAR | Status: DC | PRN
  Filled 2021-10-24: qty 3

## 2021-10-24 MED ORDER — CHLORHEXIDINE GLUCONATE CLOTH 2 % EX PADS
6.0000 | MEDICATED_PAD | Freq: Every day | CUTANEOUS | Status: DC
  Administered 2021-10-26: 6 via TOPICAL

## 2021-10-24 NOTE — Progress Notes (Signed)
Patient refused oxycodone offered. Instead, he wants hydromorphone given every two hours, most of the times calling to request it before the two hours are up. Stated he knows it does not last long, but likes the way it makes him feel. ?

## 2021-10-24 NOTE — TOC Initial Note (Signed)
Transition of Care (TOC) - Initial/Assessment Note  ? ? ?Patient Details  ?Name: [name] ?MRN: [id number] ?Date of Birth: 1956/03/27 ? ?Transition of Care (TOC) CM/SW Contact:    ?Jorah Hua E Galit Urich, LCSW ?Phone Number: ?10/24/2021, 2:20 PM ? ?Clinical Narrative:                Completed assessment with patient due to high readmission risk and SNF recommendation. ?Patient lives with his parents. Patient goes to [facility]. Patient has a RW, rollator, and 3in1 at home. Patient is active with [facility]. ?Patient stated he is agreeable to SNF. He declined having a SNF preference. He went to [facility] in the past. CSW is starting SNF work up.  ? ? ?Expected Discharge Plan: [place] ?Barriers to Discharge: Continued Medical Work up ? ? ?Patient Goals and CMS Choice ?Patient states their goals for this hospitalization and ongoing recovery are:: SNF ?CMS Medicare.gov Compare Post Acute Care list provided to:: Patient ?Choice offered to / list presented to : Patient ? ?Expected Discharge Plan and Services ?Expected Discharge Plan: [place] ?  ?  ?  ?Living arrangements for the past 2 months: [place] ?                ?  ?  ?  ?  ?  ?  ?  ?  ?  ?  ? ?Prior Living Arrangements/Services ?Living arrangements for the past 2 months: [place] ?Lives with:: Parents ?Patient language and need for interpreter reviewed:: Yes ?       ?Need for Family Participation in Patient Care: Yes (Comment) ?Care giver support system in place?: Yes (comment) ?Current home services: DME ?Criminal Activity/Legal Involvement Pertinent to Current Situation/Hospitalization: No - Comment as needed ? ?Activities of Daily Living ?  ?  ? ?Permission Sought/Granted ?Permission sought to share information with : Customer service manager ?Permission granted to share information with : Yes, Verbal Permission Granted ?   ? Permission granted to share info w AGENCY: SNFs ?   ?    ? ?Emotional Assessment ?  ?  ?  ?Orientation: : Oriented to Self, Oriented to Place, Oriented to  Time, Oriented to Situation ?Alcohol / Substance Use: Not Applicable ?Psych Involvement: No (comment) ? ?Admission diagnosis:  Bilateral lower leg cellulitis [L03.116, L03.115] ?Patient Active Problem List  ? Diagnosis Date Noted  ? Bilateral lower leg cellulitis 10/22/2021  ? Physical deconditioning 10/22/2021  ? Venous stasis of both lower extremities 10/22/2021  ? Cellulitis 10/18/2021  ? Multiple open wounds of left lower leg 10/02/2021  ? Multiple open wounds of lower leg 10/01/2021  ? Hyperkalemia 08/08/2021  ? Acute hypoxemic respiratory failure ([place]) 08/08/2021  ? Lymphedema associated with obesity 08/07/2021  ? OSA on CPAP 08/07/2021  ? Cirrhosis of liver ([place]) 06/16/2021  ? Esophageal varices ([place]) 06/16/2021  ? Hyperphosphatemia 06/15/2021  ? Persistent atrial fibrillation ([place])   ? HFrEF (heart failure with reduced ejection fraction) ([place])   ? Chest pain 01/05/2021  ? Cough   ? CAD (coronary artery disease) 01/05/2020  ? Chronic anticoagulation 01/05/2020  ? Acute bronchitis 01/05/2020  ? AF (paroxysmal atrial fibrillation) ([place]) 01/05/2020  ? Intractable pain 10/24/2019  ? History of substance abuse ([place]) 10/24/2019  ? Elevated troponin 10/24/2019  ? Nonspecific chest pain 10/24/2019  ? Chronic diastolic CHF (congestive heart failure) ([place]) 09/28/2019  ? History of MI (myocardial infarction) 09/28/2019  ? Anemia of chronic kidney failure,  stage 5 ([place]) 09/28/2019  ? History of pulmonary embolism 09/28/2019  ? Chronic midline low back pain   ? Acute respiratory failure with hypoxia ([place])   ? Acute diastolic CHF (congestive heart failure) ([place])   ? Anemia of chronic disease   ? Thrombocytopenia ([place])   ? Congestive heart failure ([place])   ? Fluid overload 06/21/2019  ? Volume overload 11/15/2018  ? ESRD (end stage renal disease) on dialysis ([place]) 11/14/2018  ? HCAP (healthcare-associated pneumonia) 08/27/2018   ? SOB (shortness of breath) 08/25/2018  ? Chest pain of uncertain etiology 44/96/7591  ? ESRD on dialysis ([place]) 11/08/2016  ? Prostate cancer screening 11/08/2016  ? Acquired cyst of kidney 11/08/2016  ? Urinary urgency 11/08/2016  ? Acute on chronic renal failure ([place]) 09/23/2016  ? Hypertensive urgency 09/21/2016  ? Dysthymia 02/26/2016  ? Chronic pain 02/26/2016  ? Noncompliance with renal dialysis 02/26/2016  ? Opiate abuse, continuous ([place]) 02/03/2016  ? Substance induced mood disorder ([place]) 02/03/2016  ? Antisocial personality disorder ([place]) 02/03/2016  ? Left-sided weakness 01/18/2016  ? HEPATITIS C 06/28/2008  ? HLD (hyperlipidemia) 06/28/2008  ? SUBSTANCE ABUSE, MULTIPLE 06/28/2008  ? Hypertension 06/27/2008  ? ?PCP:  [facility] [place]:   ?[id number] [address], [place] [address] ?Phone: [phone] Fax: [phone] ? ?[id number] - [place], [place] - [address] ?[address] ?[place] [address] ?Phone: [phone] Fax: [phone] ? ?[id number] - [place], [place] - [address] AT [place] ?[address] ?[place] [address] ?Phone: [phone] Fax: [phone] ? ? ? ? ?Social Determinants of Health (SDOH) Interventions ?  ? ?Readmission Risk Interventions ? ?  10/24/2021  ?  2:19 PM 10/03/2021  ?  1:48 PM  ?Readmission Risk Prevention Plan  ?Transportation Screening Complete Complete  ?Medication Review Press photographer) Complete Complete  ?PCP or Specialist appointment within 3-5 days of discharge Complete Complete  ?[place] or Home Care Consult Complete Complete  ?SW Recovery Care/Counseling Consult Complete   ?Palliative Care Screening Not Applicable Not Applicable  ?Skilled Nursing Facility Complete Not Applicable  ? ? ? ?

## 2021-10-24 NOTE — Plan of Care (Signed)
?  Problem: Education: ?Goal: Knowledge of General Education information will improve ?Description: Including pain rating scale, medication(s)/side effects and non-pharmacologic comfort measures ?Outcome: Progressing ?  ?Problem: Health Behavior/Discharge Planning: ?Goal: Ability to manage health-related needs will improve ?Outcome: Not Progressing ?  ?Problem: Clinical Measurements: ?Goal: Ability to maintain clinical measurements within normal limits will improve ?Outcome: Progressing ?Goal: Will remain free from infection ?Outcome: Progressing ?Goal: Diagnostic test results will improve ?Outcome: Progressing ?Goal: Respiratory complications will improve ?Outcome: Progressing ?Goal: Cardiovascular complication will be avoided ?Outcome: Progressing ?  ?Problem: Activity: ?Goal: Risk for activity intolerance will decrease ?Outcome: Progressing ?  ?Problem: Nutrition: ?Goal: Adequate nutrition will be maintained ?Outcome: Progressing ?  ?Problem: Coping: ?Goal: Level of anxiety will decrease ?Outcome: Not Progressing ?  ?Problem: Elimination: ?Goal: Will not experience complications related to bowel motility ?Outcome: Progressing ?  ?Problem: Pain Managment: ?Goal: General experience of comfort will improve ?Outcome: Not Progressing ?  ?Problem: Safety: ?Goal: Ability to remain free from injury will improve ?Outcome: Progressing ?  ?Problem: Skin Integrity: ?Goal: Risk for impaired skin integrity will decrease ?Outcome: Progressing ?  ?

## 2021-10-24 NOTE — NC FL2 (Signed)
?[place] MEDICAID FL2 LEVEL OF CARE SCREENING TOOL  ?  ? ?IDENTIFICATION  ?Patient Name: ?[name] Birthdate: 12-13-1955 Sex: male Admission Date (Current Location): ?10/22/2021  ?[place] and [place] Number: ? [place] ?  Facility and Address:  ?[facility], [address] [place], [place] [address] ?     Provider Number: ?[id number]  ?Attending Physician Name and Address:  ?Dwyane Dee, MD ? Relative Name and Phone Number:  ?Dawit, Tankard (Father)   [phone] [facility] Phone) ?   ?Current Level of Care: ?Hospital Recommended Level of Care: ?[place] Prior Approval Number: ?  ? ?Date Approved/Denied: ?  PASRR Number: ?[id number] A ? ?Discharge Plan: ?  ?  ? ?Current Diagnoses: ?Patient Active Problem List  ? Diagnosis Date Noted  ? Bilateral lower leg cellulitis 10/22/2021  ? Physical deconditioning 10/22/2021  ? Venous stasis of both lower extremities 10/22/2021  ? Cellulitis 10/18/2021  ? Multiple open wounds of left lower leg 10/02/2021  ? Multiple open wounds of lower leg 10/01/2021  ? Hyperkalemia 08/08/2021  ? Acute hypoxemic respiratory failure ([place]) 08/08/2021  ? Lymphedema associated with obesity 08/07/2021  ? OSA on CPAP 08/07/2021  ? Cirrhosis of liver ([place]) 06/16/2021  ? Esophageal varices ([place]) 06/16/2021  ? Hyperphosphatemia 06/15/2021  ? Persistent atrial fibrillation ([place])   ? HFrEF (heart failure with reduced ejection fraction) ([place])   ? Chest pain 01/05/2021  ? Cough   ? CAD (coronary artery disease) 01/05/2020  ? Chronic anticoagulation 01/05/2020  ? Acute bronchitis 01/05/2020  ? AF (paroxysmal atrial fibrillation) ([place]) 01/05/2020  ? Intractable pain 10/24/2019  ? History of substance abuse ([place]) 10/24/2019  ? Elevated troponin 10/24/2019  ? Nonspecific chest pain 10/24/2019  ? Chronic diastolic CHF (congestive heart failure) ([place]) 09/28/2019  ? History of MI (myocardial infarction) 09/28/2019  ? Anemia of chronic kidney failure, stage 5  ([place]) 09/28/2019  ? History of pulmonary embolism 09/28/2019  ? Chronic midline low back pain   ? Acute respiratory failure with hypoxia ([place])   ? Acute diastolic CHF (congestive heart failure) ([place])   ? Anemia of chronic disease   ? Thrombocytopenia ([place])   ? Congestive heart failure ([place])   ? Fluid overload 06/21/2019  ? Volume overload 11/15/2018  ? ESRD (end stage renal disease) on dialysis ([place]) 11/14/2018  ? HCAP (healthcare-associated pneumonia) 08/27/2018  ? SOB (shortness of breath) 08/25/2018  ? Chest pain of uncertain etiology 19/62/2297  ? ESRD on dialysis ([place]) 11/08/2016  ? Prostate cancer screening 11/08/2016  ? Acquired cyst of kidney 11/08/2016  ? Urinary urgency 11/08/2016  ? Acute on chronic renal failure ([place]) 09/23/2016  ? Hypertensive urgency 09/21/2016  ? Dysthymia 02/26/2016  ? Chronic pain 02/26/2016  ? Noncompliance with renal dialysis 02/26/2016  ? Opiate abuse, continuous ([place]) 02/03/2016  ? Substance induced mood disorder ([place]) 02/03/2016  ? Antisocial personality disorder ([place]) 02/03/2016  ? Left-sided weakness 01/18/2016  ? HEPATITIS C 06/28/2008  ? HLD (hyperlipidemia) 06/28/2008  ? SUBSTANCE ABUSE, MULTIPLE 06/28/2008  ? Hypertension 06/27/2008  ? ? ?Orientation RESPIRATION BLADDER Height & Weight   ?  ?Self, Time, Situation, Place ? Normal Continent Weight: 264 lb 8.8 oz (120 kg) ?Height:  6' (182.9 cm)  ?BEHAVIORAL SYMPTOMS/MOOD NEUROLOGICAL BOWEL NUTRITION STATUS  ?    Continent Diet (heart)  ?AMBULATORY STATUS COMMUNICATION OF NEEDS Skin   ?Extensive Assist Verbally Skin abrasions ?  ?  ?  ?    ?     ?     ? ? ?  Personal Care Assistance Level of Assistance  ?Bathing, Feeding, Dressing Bathing Assistance: Maximum assistance ?Feeding assistance: Limited assistance ?Dressing Assistance: Maximum assistance ?   ? ?Functional Limitations Info  ?    ?  ?   ? ? ?SPECIAL CARE FACTORS FREQUENCY  ?PT (By licensed PT), OT (By licensed OT)   ?  ?PT Frequency: 5 times per week ?OT Frequency: 5  times per week ?  ?  ?  ?   ? ? ?Contractures    ? ? ?Additional Factors Info  ?Code Status, Allergies Code Status Info: full ?Allergies Info: Cyclobenzaprine, Amlodipine, Clonidine, Furosemide, Tylenol (Acetaminophen) ?  ?  ?  ?   ? ?Current Medications (10/24/2021):  This is the current hospital active medication list ?Current Facility-Administered Medications  ?Medication Dose Route Frequency Provider Last Rate Last Admin  ? acetaminophen (TYLENOL) tablet 650 mg  650 mg Oral Q6H PRN Athena Masse, MD      ? Or  ? acetaminophen (TYLENOL) suppository 650 mg  650 mg Rectal Q6H PRN Athena Masse, MD      ? albuterol (PROVENTIL) (2.5 MG/3ML) 0.083% nebulizer solution 3 mL  3 mL Inhalation Q6H PRN Athena Masse, MD      ? apixaban Arne [place]) tablet 5 mg  5 mg Oral BID Athena Masse, MD   5 mg at 10/24/21 9326  ? atorvastatin (LIPITOR) tablet 40 mg  40 mg Oral QHS Athena Masse, MD   40 mg at 10/23/21 2209  ? bumetanide (BUMEX) tablet 2 mg  2 mg Oral Daily Athena Masse, MD   2 mg at 10/24/21 7124  ? carvedilol (COREG) tablet 6.25 mg  6.25 mg Oral BID Athena Masse, MD   6.25 mg at 10/24/21 5809  ? [START ON 10/25/2021] ceFEPIme (MAXIPIME) 2 g in sodium chloride 0.9 % 100 mL IVPB  2 g Intravenous Q M,W,F-HD Dorothe Pea, RPH      ? HYDROmorphone (DILAUDID) injection 0.5 mg  0.5 mg Intravenous Q2H PRN Athena Masse, MD   0.5 mg at 10/24/21 1258  ? ondansetron (ZOFRAN) tablet 4 mg  4 mg Oral Q6H PRN Athena Masse, MD      ? Or  ? ondansetron [facility]) injection 4 mg  4 mg Intravenous Q6H PRN Athena Masse, MD      ? oxyCODONE (Oxy IR/ROXICODONE) immediate release tablet 10 mg  10 mg Oral Q6H PRN Athena Masse, MD   10 mg at 10/23/21 1735  ? polyethylene glycol (MIRALAX / GLYCOLAX) packet 17 g  17 g Oral Daily Athena Masse, MD   17 g at 10/24/21 9833  ? tamsulosin (FLOMAX) capsule 0.4 mg  0.4 mg Oral Daily Athena Masse, MD   0.4 mg at 10/24/21 8250  ? [START ON 10/25/2021] vancomycin (VANCOCIN)  IVPB 1000 mg/200 mL premix  1,000 mg Intravenous Q M,W,F-HD Virl Cagey E, RPH      ? ? ? ?Discharge Medications: ?Please see discharge summary for a list of discharge medications. ? ?Relevant Imaging Results: ? ?Relevant Lab Results: ? ? ?Additional Information ?SS #: [phone]. HD [place] MWF 11:00 am ? ?Izza Bickle E Calyn Rubi, LCSW ? ? ? ? ?

## 2021-10-24 NOTE — Progress Notes (Signed)
?Progress Note ? ? ? ?Remi Haggard   ?JGO:[id number]  ?DOB: 1956-04-28  ?DOA: 10/22/2021     2 ?PCP: [facility] [place] ? ?Initial CC: Leg pain and weakness ? ?Hospital Course: ?STEPHAUN MILLION is a 66 y.o. male with medical history significant for ESRD on HD MWF, HTN, A-fib on Eliquis, cirrhosis with esophageal varices, HFrEF (EF 40 to 45%) chronic venous stasis, recently hospitalized from 4/10 to 4/13 for bilateral lower extremity cellulitis, treated with IV vancomycin and cefepime (end date 4/20) and discharged home with home health. ?He returned back to the hospital after falling at home and being unable to get up and too much work for his brother. ?He was having ongoing intractable leg pain related to his cellulitis. ?He was restarted back on his antibiotics and admitted for probable placement in rehab. ? ?Interval History:  ?No events overnight.  His legs were wrapped yesterday and his pain is improved today. ? ?Assessment and Plan: ?* Bilateral lower leg cellulitis ?- Continue IV vancomycin and cefepime as per regimen; end date 4/20 per last ID note ?- Keep legs elevated ?- Per recent wound care recommendation: ?Daily dressing changes using a silver hydrofiber (Aquacel Ag+ Advantage covered with ABD pads for antimicrobial donation and absorption, and secure with dry boot (Kerlix and ACE) over Unna's boot at this time and change daily.  ?-Pressure redistribution heel boots and pressure redistribution chair cushion for pressure injury prevention when OOB, as the patient sleeps in a recliner chair ? ? ?Physical deconditioning ?- significant deconditioning from last hospitalization ?- now amenable to SNF ?- follow up bed search ? ?ESRD on dialysis [facility]) ?- Nephrology following for continuation of dialysis ?- Schedule is MWF typically ? ?Venous stasis of both lower extremities ?Keep leg elevated ?Continue Unna boots, Lasix ?Other management as above ? ?Intractable pain ?- continue pain  control ? ?Esophageal varices (HCC) ?Related to cirrhosis.  No evidence of acute bleeding ? ?Cirrhosis of liver ([place]) ?Not acutely decompensated ? ?HFrEF (heart failure with reduced ejection fraction) ([place]) ?Most recent EF 40 to 45% ?Continue Lasix and carvedilol ? ?AF (paroxysmal atrial fibrillation) ([place]) ?Continue carvedilol and apixaban ? ?Chronic anticoagulation ?Continue apixaban for stroke prevention and A-fib as hemoglobin is stable and no evidence of active bleeding ? ?Anemia of chronic disease ?Hemoglobin at baseline and stable ? ?Acute respiratory failure with hypoxia ([place]) ?Secondary to A-fib and history of PE ?Continue systemic anticoagulation ? ? ?Old records reviewed in assessment of this patient ? ?Antimicrobials: ?Vanc ?Cefepime ? ?DVT prophylaxis:  ? ?apixaban (ELIQUIS) tablet 5 mg  ? ?Code Status:   Code Status: Full Code ? ?Disposition Plan:  SNF ?Status is: Inpt ? ?Objective: ?Blood pressure (!) 107/52, pulse 60, temperature 98.5 ?F (36.9 ?C), resp. rate 18, height 6' (1.829 m), weight 120 kg, SpO2 90 %.  ?Examination:  ?Physical Exam ?Constitutional:   ?   General: He is not in acute distress. ?   Comments: Chronically ill-appearing  ?HENT:  ?   Head: Normocephalic and atraumatic.  ?   Mouth/Throat:  ?   Mouth: Mucous membranes are moist.  ?Eyes:  ?   Extraocular Movements: Extraocular movements intact.  ?Cardiovascular:  ?   Rate and Rhythm: Normal rate and regular rhythm.  ?   Heart sounds: Normal heart sounds.  ?Pulmonary:  ?   Effort: Pulmonary effort is normal. No respiratory distress.  ?   Breath sounds: Normal breath sounds. No wheezing.  ?Abdominal:  ?   General:  Bowel sounds are normal. There is no distension.  ?   Palpations: Abdomen is soft.  ?   Tenderness: There is no abdominal tenderness.  ?Musculoskeletal:  ?   Cervical back: Normal range of motion and neck supple.  ?   Comments: 3+ bilateral lower extremity pitting edema up to knees  ?Skin: ?   Comments: Wound dressings in place  with scattered skin excoriations and purulent drainage  ?Neurological:  ?   General: No focal deficit present.  ?   Mental Status: He is alert.  ?Psychiatric:     ?   Mood and Affect: Mood normal.     ?   Behavior: Behavior normal.  ?  ? ?Consultants:  ?Nephrology ? ?Procedures:  ? ? ?Data Reviewed: ?No results found for this or any previous visit (from the past 24 hour(s)). ?  ?I have Reviewed nursing notes, Vitals, and Lab results since pt's last encounter. Pertinent lab results : see above ?I have ordered test including BMP, CBC, Mg ?I have reviewed the last note from staff over past 24 hours ?I have discussed pt's care plan and test results with nursing staff, case manager ? ? LOS: 2 days  ? ?Dwyane Dee, MD ?[facility] ?10/24/2021, 12:19 PM ? ?

## 2021-10-25 DIAGNOSIS — L03116 Cellulitis of left lower limb: Secondary | ICD-10-CM | POA: Diagnosis not present

## 2021-10-25 DIAGNOSIS — L03115 Cellulitis of right lower limb: Secondary | ICD-10-CM | POA: Diagnosis not present

## 2021-10-25 LAB — CBC WITH DIFFERENTIAL/PLATELET
Abs Immature Granulocytes: 0.07 10*3/uL (ref 0.00–0.07)
Basophils Absolute: 0.1 10*3/uL (ref 0.0–0.1)
Basophils Relative: 1 %
Eosinophils Absolute: 0.2 10*3/uL (ref 0.0–0.5)
Eosinophils Relative: 3 %
HCT: 23.1 % — ABNORMAL LOW (ref 39.0–52.0)
Hemoglobin: 7.4 g/dL — ABNORMAL LOW (ref 13.0–17.0)
Immature Granulocytes: 1 %
Lymphocytes Relative: 15 %
Lymphs Abs: 1.2 10*3/uL (ref 0.7–4.0)
MCH: 27.9 pg (ref 26.0–34.0)
MCHC: 32 g/dL (ref 30.0–36.0)
MCV: 87.2 fL (ref 80.0–100.0)
Monocytes Absolute: 0.9 10*3/uL (ref 0.1–1.0)
Monocytes Relative: 11 %
Neutro Abs: 5.8 10*3/uL (ref 1.7–7.7)
Neutrophils Relative %: 69 %
Platelets: 131 10*3/uL — ABNORMAL LOW (ref 150–400)
RBC: 2.65 MIL/uL — ABNORMAL LOW (ref 4.22–5.81)
RDW: 15.9 % — ABNORMAL HIGH (ref 11.5–15.5)
WBC: 8.3 10*3/uL (ref 4.0–10.5)
nRBC: 0 % (ref 0.0–0.2)

## 2021-10-25 LAB — MAGNESIUM: Magnesium: 2.1 mg/dL (ref 1.7–2.4)

## 2021-10-25 LAB — RENAL FUNCTION PANEL
Albumin: 2.4 g/dL — ABNORMAL LOW (ref 3.5–5.0)
Anion gap: 13 (ref 5–15)
BUN: 56 mg/dL — ABNORMAL HIGH (ref 8–23)
CO2: 27 mmol/L (ref 22–32)
Calcium: 7.8 mg/dL — ABNORMAL LOW (ref 8.9–10.3)
Chloride: 96 mmol/L — ABNORMAL LOW (ref 98–111)
Creatinine, Ser: 7.7 mg/dL — ABNORMAL HIGH (ref 0.61–1.24)
GFR, Estimated: 7 mL/min — ABNORMAL LOW (ref >60)
Glucose, Bld: 98 mg/dL (ref 70–99)
Phosphorus: 6.8 mg/dL — ABNORMAL HIGH (ref 2.5–4.6)
Potassium: 4.8 mmol/L (ref 3.5–5.1)
Sodium: 136 mmol/L (ref 135–145)

## 2021-10-25 MED ORDER — EPOETIN ALFA 10000 UNIT/ML IJ SOLN
INTRAMUSCULAR | Status: AC
  Filled 2021-10-25: qty 1

## 2021-10-25 MED ORDER — OXYCODONE HCL 5 MG PO TABS
10.0000 mg | ORAL_TABLET | ORAL | Status: DC | PRN
  Administered 2021-10-25 – 2021-10-26 (×4): 10 mg via ORAL
  Filled 2021-10-25 (×5): qty 2

## 2021-10-25 MED ORDER — EPOETIN ALFA 10000 UNIT/ML IJ SOLN
10000.0000 [IU] | INTRAMUSCULAR | Status: DC
  Administered 2021-10-25: 10000 [IU] via INTRAVENOUS

## 2021-10-25 NOTE — Progress Notes (Signed)
Occupational Therapy Treatment ?Patient Details ?Name: [name] ?MRN: [id number] ?DOB: 03-03-1956 ?Today's Date: 10/25/2021 ? ? ?History of present illness Pt is a 66 y/o M admitted who was recently hospitalized 4/10-4/13 for BLE cellulitis, treated & d/c home after declining SNF. Pt returns 1 day after d/c after brother, whom he lives with, states he is unable to care for pt. Pt c/o ongoing intractable BLE pain 2/2 cellulitis. PMH: ESRD on HD MWF, HTN, a-fib on eliquis, cirrhosis with esophageal varices, HFrEF (EF 40-45%), chronic venous stasism depression ?  ?OT comments ? Pt seen for OT tx this date. Pt received in recliner and agreeable with encouragement to grooming tasks. Pt completed ADL transfer from recliner with MIN A to stand with elevated tray table for grooming tasks, demonstrating fair static standing balance with intermittent UE support on the tray table to wash his face and brush teeth. Pt returned to sitting 2/2 decr activity tolerance. Upon sitting, OT noted a couple drops of blood on the floor between the pt's legs. RN notified to come assess. Pt stood again with RW and CGA-MIN A and required TOTAL A for pericare in standing and RN assessed skin breakdown on scrotum. Pt ambulated ~4' with CGA-MIN A + RW to sit in dialysis chair and taken to HD. Pt progressing towards goals, however continues to benefit from skilled OT Services and continue to recommend SNF at this time.   ? ?Recommendations for follow up therapy are one component of a multi-disciplinary discharge planning process, led by the attending physician.  Recommendations may be updated based on patient status, additional functional criteria and insurance authorization. ?   ?Follow Up Recommendations ? Skilled nursing-short term rehab (<3 hours/day)  ?  ?Assistance Recommended at Discharge Frequent or constant Supervision/Assistance  ?Patient can return home with the following ? A lot of help with bathing/dressing/bathroom;Assistance  with cooking/housework;A lot of help with walking and/or transfers;Assist for transportation;Help with stairs or ramp for entrance ?  ?Equipment Recommendations ? None recommended by OT  ?  ?Recommendations for Other Services   ? ?  ?Precautions / Restrictions Precautions ?Precautions: Fall ?Precaution Comments: some skin breakdown on scrotum, LUE fistula ?Restrictions ?Weight Bearing Restrictions: No  ? ? ?  ? ?Mobility Bed Mobility ?  ?  ?  ?  ?  ?  ?  ?General bed mobility comments: NT, in recliner ?  ? ?Transfers ?Overall transfer level: Needs assistance ?Equipment used: None, Rolling walker (2 wheels) ?Transfers: Sit to/from Stand ?Sit to Stand: Min assist ?  ?  ?  ?  ?  ?General transfer comment: MIN A to stand from recliner with tray table for grooming, CGA from recliner with RW to stand prior to getting into dialysis chair ?  ?  ?Balance Overall balance assessment: Needs assistance ?Sitting-balance support: No upper extremity supported, Feet supported ?Sitting balance-Leahy Scale: Good ?  ?  ?Standing balance support: Single extremity supported, No upper extremity supported, During functional activity ?Standing balance-Leahy Scale: Fair ?Standing balance comment: fair static standing balance for grooming tasks with intermittent UE support on the tray table, but able to tolerate brief periods of time with no UE support for bilat tasks such as opening toothpaste ?  ?  ?  ?  ?  ?  ?  ?  ?  ?  ?  ?   ? ?ADL either performed or assessed with clinical judgement  ? ?ADL Overall ADL's : Needs assistance/impaired ?  ?  ?Grooming: Standing;Min guard;Oral care;Set up;Wash/dry  face ?Grooming Details (indicate cue type and reason): limited endurance, CGA with intermittent UE support on the elevated tray table for balance ?  ?  ?  ?  ?  ?  ?  ?  ?  ?  ?Toileting- Water quality scientist and Hygiene: Sit to/from stand;Total assistance ?Toileting - Clothing Manipulation Details (indicate cue type and reason): incontinent of  BM smear on chuck pad once stood, unaware, requiring TOTAL A For pericare in standing with BUE Support on RW for balance ?  ?  ?  ?  ?  ? ?Extremity/Trunk Assessment   ?  ?  ?  ?  ?  ? ?Vision   ?  ?  ?Perception   ?  ?Praxis   ?  ? ?Cognition Arousal/Alertness: Awake/alert ?Behavior During Therapy: [facility] for tasks assessed/performed ?Overall Cognitive Status: Within Functional Limits for tasks assessed ?  ?  ?  ?  ?  ?  ?  ?  ?  ?  ?  ?  ?  ?  ?  ?  ?  ?  ?  ?   ?Exercises   ? ?  ?Shoulder Instructions   ? ? ?  ?General Comments noted a couple drops of blood on the floor between his legs as he stood for grooming tasks and returned to sitting to clean up and RN notified to come assess. Skin breakdown noted on scrotum upon visual assessment in standing by RN during pericare.  ? ? ?Pertinent Vitals/ Pain       Pain Assessment ?Pain Assessment: 0-10 ?Pain Score: 8  ?Pain Location: BLE ?Pain Descriptors / Indicators: Aching ?Pain Intervention(s): Limited activity within patient's tolerance, Monitored during session, Premedicated before session, Repositioned ? ?Home Living   ?  ?  ?  ?  ?  ?  ?  ?  ?  ?  ?  ?  ?  ?  ?  ?  ?  ?  ? ?  ?Prior Functioning/Environment    ?  ?  ?  ?   ? ?Frequency ? Min 2X/week  ? ? ? ? ?  ?Progress Toward Goals ? ?OT Goals(current goals can now be found in the care plan section) ? Progress towards OT goals: Progressing toward goals ? ?Acute Rehab OT Goals ?Patient Stated Goal: decrease pain ?OT Goal Formulation: With patient ?Time For Goal Achievement: 11/06/21 ?Potential to Achieve Goals: Good  ?Plan Discharge plan remains appropriate;Frequency remains appropriate   ? ?Co-evaluation ? ? ?   ?  ?  ?  ?  ? ?  ?AM-PAC OT "6 Clicks" Daily Activity     ?Outcome Measure ? ? Help from another person eating meals?: None ?Help from another person taking care of personal grooming?: A Little ?Help from another person toileting, which includes using toliet, bedpan, or urinal?: Total ?Help from another  person bathing (including washing, rinsing, drying)?: A Lot ?Help from another person to put on and taking off regular upper body clothing?: A Little ?Help from another person to put on and taking off regular lower body clothing?: A Lot ?6 Click Score: 15 ? ?  ?End of Session Equipment Utilized During Treatment: Gait belt;Rolling walker (2 wheels) ? ?OT Visit Diagnosis: Unsteadiness on feet (R26.81);Muscle weakness (generalized) (M62.81);Pain ?Pain - Right/Left: Right ?Pain - part of body: Leg ?  ?Activity Tolerance Patient tolerated treatment well ?  ?Patient Left in chair;Other (comment) (left in dialysis chair with transport staff for HD) ?  ?Nurse Communication Other (comment) (  skin breakdown) ?  ? ?   ? ?Time: 0097-9499 ?OT Time Calculation (min): 12 min ? ?Charges: OT General Charges ?$OT Visit: 1 Visit ?OT Treatments ?$Self Care/Home Management : 8-22 mins ? ?Ardeth Perfect., MPH, MS, OTR/L ?ascom [phone] ?10/25/21, 10:43 AM ? ?

## 2021-10-25 NOTE — Progress Notes (Addendum)
9244 ?Pt stated pain is 9/10 medicated with 0.5 mg of IV dilaudid prior to wound care nurse dressing changes ? ?0948 ?Report given to dialysis nurse at this time.  ? ?1015 ?Pt transferred down to dialysis at this time in recliner.  ? ?1528 ?Dialysis nurse informed nurse that 2L of fluid were removed. Pt will be transporting back to room shortly ?

## 2021-10-25 NOTE — Progress Notes (Signed)
?Progress Note ? ? ? ?[name]   ?KWI:[id number]  ?DOB: Apr 23, 1956  ?DOA: 10/22/2021     3 ?[facility]P: [facility] [place] ? ?Initial CC: Leg pain and weakness ? ?Hospital Course: ?[name] is a 66 y.o. male with medical history significant for ESRD on HD MWF, HTN, A-fib on Eliquis, cirrhosis with esophageal varices, HFrEF (EF 40 to 45%) chronic venous stasis, recently hospitalized from 4/10 to 4/13 for bilateral lower extremity cellulitis, treated with IV vancomycin and cefepime (end date 4/20) and discharged home with home health. ?He returned back to the hospital after falling at home and being unable to get up and too much work for his brother. ?He was having ongoing intractable leg pain related to his cellulitis. ?He was restarted back on his antibiotics and admitted for probable placement in rehab. ? ?Interval History:  ?No events overnight. Seen in HD today. Pain was controlled when I saw him but still having it of course. Still getting quite a bit of IV dilaudid and not much of oxy.  ? ?Assessment and Plan: ?* Bilateral lower leg cellulitis ?- Continue IV vancomycin and cefepime as per regimen; end date 4/20 per last ID note ?- Keep legs elevated ?- Per recent wound care recommendation: ?Daily dressing changes using a silver hydrofiber (Aquacel Ag+ Advantage covered with ABD pads for antimicrobial donation and absorption, and secure with dry boot (Kerlix and ACE) over Unna's boot at this time and change daily.  ?-Pressure redistribution heel boots and pressure redistribution chair cushion for pressure injury prevention when OOB, as the patient sleeps in a recliner chair ? ? ?Physical deconditioning ?- significant deconditioning from last hospitalization ?- now amenable to SNF ?- follow up bed search ? ?ESRD on dialysis [facility]) ?- Nephrology following for continuation of dialysis ?- Schedule is MWF typically ? ?Venous stasis of both lower extremities ?Keep leg elevated ?Continue Unna boots,  Lasix ?Other management as above ? ?Intractable pain ?- continue pain control ? ?Esophageal varices (HCC) ?Related to cirrhosis.  No evidence of acute bleeding ? ?Cirrhosis of liver ([place]) ?Not acutely decompensated ? ?HFrEF (heart failure with reduced ejection fraction) ([place]) ?Most recent EF 40 to 45% ?Continue Lasix and carvedilol ? ?AF (paroxysmal atrial fibrillation) ([place]) ?Continue carvedilol and apixaban ? ?Chronic anticoagulation ?Continue apixaban for stroke prevention and A-fib as hemoglobin is stable and no evidence of active bleeding ? ?Anemia of chronic disease ?Hemoglobin at baseline and stable ? ?Acute respiratory failure with hypoxia ([place]) ?Secondary to A-fib and history of PE ?Continue systemic anticoagulation ? ? ?Old records reviewed in assessment of this patient ? ?Antimicrobials: ?Vanc ?Cefepime ? ?DVT prophylaxis:  ? ?apixaban (ELIQUIS) tablet 5 mg  ? ?Code Status:   Code Status: Full Code ? ?Disposition Plan:  SNF ?Status is: Inpt ? ?Objective: ?Blood pressure 132/76, pulse 71, temperature 97.8 ?F (36.6 ?C), resp. rate 16, height 6' (1.829 m), weight 120.9 kg, SpO2 (!) 86 %.  ?Examination:  ?Physical Exam ?Constitutional:   ?   General: He is not in acute distress. ?   Comments: Chronically ill-appearing  ?HENT:  ?   Head: Normocephalic and atraumatic.  ?   Mouth/Throat:  ?   Mouth: Mucous membranes are moist.  ?Eyes:  ?   Extraocular Movements: Extraocular movements intact.  ?Cardiovascular:  ?   Rate and Rhythm: Normal rate and regular rhythm.  ?   Heart sounds: Normal heart sounds.  ?Pulmonary:  ?   Effort: Pulmonary effort is normal. No respiratory  distress.  ?   Breath sounds: Normal breath sounds. No wheezing.  ?Abdominal:  ?   General: Bowel sounds are normal. There is no distension.  ?   Palpations: Abdomen is soft.  ?   Tenderness: There is no abdominal tenderness.  ?Musculoskeletal:  ?   Cervical back: Normal range of motion and neck supple.  ?   Comments: 3+ bilateral lower extremity  pitting edema up to knees  ?Skin: ?   Comments: Wound dressings in place with scattered skin excoriations and purulent drainage  ?Neurological:  ?   General: No focal deficit present.  ?   Mental Status: He is alert.  ?Psychiatric:     ?   Mood and Affect: Mood normal.     ?   Behavior: Behavior normal.  ?  ? ?Consultants:  ?Nephrology ? ?Procedures:  ? ? ?Data Reviewed: ?Results for orders placed or performed during the hospital encounter of 10/22/21 (from the past 24 hour(s))  ?Renal function panel     Status: Abnormal  ? Collection Time: 10/25/21  2:51 AM  ?Result Value Ref Range  ? Sodium 136 135 - 145 mmol/L  ? Potassium 4.8 3.5 - 5.1 mmol/L  ? Chloride 96 (L) 98 - 111 mmol/L  ? CO2 27 22 - 32 mmol/L  ? Glucose, Bld 98 70 - 99 mg/dL  ? BUN 56 (H) 8 - 23 mg/dL  ? Creatinine, Ser 7.70 (H) 0.61 - 1.24 mg/dL  ? Calcium 7.8 (L) 8.9 - 10.3 mg/dL  ? Phosphorus 6.8 (H) 2.5 - 4.6 mg/dL  ? Albumin 2.4 (L) 3.5 - 5.0 g/dL  ? GFR, Estimated 7 (L) >60 mL/min  ? Anion gap 13 5 - 15  ?CBC with Differential/Platelet     Status: Abnormal  ? Collection Time: 10/25/21  2:51 AM  ?Result Value Ref Range  ? WBC 8.3 4.0 - 10.5 K/uL  ? RBC 2.65 (L) 4.22 - 5.81 MIL/uL  ? Hemoglobin 7.4 (L) 13.0 - 17.0 g/dL  ? HCT 23.1 (L) 39.0 - 52.0 %  ? MCV 87.2 80.0 - 100.0 fL  ? MCH 27.9 26.0 - 34.0 pg  ? MCHC 32.0 30.0 - 36.0 g/dL  ? RDW 15.9 (H) 11.5 - 15.5 %  ? Platelets 131 (L) 150 - 400 K/uL  ? nRBC 0.0 0.0 - 0.2 %  ? Neutrophils Relative % 69 %  ? Neutro Abs 5.8 1.7 - 7.7 K/uL  ? Lymphocytes Relative 15 %  ? Lymphs Abs 1.2 0.7 - 4.0 K/uL  ? Monocytes Relative 11 %  ? Monocytes Absolute 0.9 0.1 - 1.0 K/uL  ? Eosinophils Relative 3 %  ? Eosinophils Absolute 0.2 0.0 - 0.5 K/uL  ? Basophils Relative 1 %  ? Basophils Absolute 0.1 0.0 - 0.1 K/uL  ? Immature Granulocytes 1 %  ? Abs Immature Granulocytes 0.07 0.00 - 0.07 K/uL  ?Magnesium     Status: None  ? Collection Time: 10/25/21  2:51 AM  ?Result Value Ref Range  ? Magnesium 2.1 1.7 - 2.4 mg/dL   ? ?  ?I have Reviewed nursing notes, Vitals, and Lab results since pt's last encounter. Pertinent lab results : see above ?I have ordered test including BMP, CBC, Mg ?I have reviewed the last note from staff over past 24 hours ?I have discussed pt's care plan and test results with nursing staff, case manager ? ? LOS: 3 days  ? ?Dwyane Dee, MD ?[facility] ?10/25/2021, 4:11 PM ? ?

## 2021-10-25 NOTE — Consult Note (Addendum)
WOC Nurse Consult Note: ?Reason for Consult: Consult requested for bilat legs.  Pt is familiar to [facility] team from previous admissions, most recently 4/10.  He has chronic full thickness wounds to bilat legs, also generalized edema.  He states he has been followed by the [place] clinic and home health prior to admission, and was previously using [place] boots but now he has been using xeroform gauze and light compression every other day.  Legs have improved in appearance since previous admission, upon review of progress notes.  Left anterior and poster calf with scattered red dry moist full thickness wounds, largest is approx 2X2X.2cm, mod amt yellow drainage. ?Right anterior and posterior calf with more consistent generalized full thickness wounds, yellow and moist; affected area is approx 20X40X.2cm, mod amt yellow drainage. ?Pt medicated for pain prior to the procedure and tolerated dressing change with mod amt discomfort.  ?Dressing procedure/placement/frequency: Topical treatment orders provided for bedside nurses to perform as follows: Bedside nurse; please change bilat leg dressings Q M/W/F as follows: Apply xeroform gauze to open wounds, then cover with ABD pads.  Apply kerlex in a spiral fashion, beginning just behind toes, to below knees.  Apply ace wrap in the same manner. Moisten with NS each time to remove previous dressings.  ?Pt should resume follow-up with the outpatient clinic after discharge.  ?Please re-consult if further assistance is needed.  Thank-you,  ?Julien Girt MSN, RN, [place], [place], CNS ?[phone]  ?  ?

## 2021-10-25 NOTE — Progress Notes (Signed)
[facility] ?[place], [place] ?10/25/21 ? ?Subjective:  ? ?Hospital day # 3 ? ?[name] 66 year old African-American male with past medical history including persistent atrial fibrillation on Eliquis, hypertension, hyperlipidemia, GERD, BPH, and end-stage renal disease on hemodialysis.  Patient presents to the emergency department from [facility] with complaints of right lower extremity pain and purulent drainage for 2 days.  Patient has been admitted for Bilateral lower leg cellulitis [L03.116, L03.115] ? ?Patient is known to our practice and receives outpatient dialysis treatments at this [facility] on a Monday Wednesday Friday schedule.   ? ?Patient seen and evaluated during dialysis ?  ?HEMODIALYSIS FLOWSHEET: ? ?Blood Flow Rate (mL/min): 400 mL/min ?Arterial Pressure (mmHg): -150 mmHg ?Venous Pressure (mmHg): 150 mmHg ?Transmembrane Pressure (mmHg): 80 mmHg ?Ultrafiltration Rate (mL/min): 710 mL/min ?Dialysate Flow Rate (mL/min): 600 ml/min ?Conductivity: Machine : 14 ?Conductivity: Machine : 14 ?Dialysis Fluid Bolus: Normal Saline ?Bolus Amount (mL): 250 mL ? ?No complaints at this time ?Alert and oriented ?Reports mild discomfort from leg wounds. ? ? ?Objective:  ?Vital signs in last 24 hours:  ?Temp:  [97.7 ?F (36.5 ?C)-98.3 ?F (36.8 ?C)] 97.7 ?F (36.5 ?C) (04/17 7048) ?Pulse Rate:  [34-95] 34 (04/17 1100) ?Resp:  [13-22] 13 (04/17 1115) ?BP: (103-122)/(63-71) 122/67 (04/17 1115) ?SpO2:  [84 %-99 %] 84 % (04/17 1100) ?Weight:  [120.9 kg] 120.9 kg (04/17 1037) ? ?Weight change:  ?Filed Weights  ? 10/22/21 1911 10/25/21 1037  ?Weight: 120 kg 120.9 kg  ? ? ?Intake/Output: ?  ? ?Intake/Output Summary (Last 24 hours) at 10/25/2021 1214 ?Last data filed at 10/25/2021 8891 ?Gross per 24 hour  ?Intake 300 ml  ?Output 0 ml  ?Net 300 ml  ? ? ? ? ?Physical Exam: ?General: NAD  ?HEENT Anicteric, moist oral mucous membranes  ?Pulm/lungs Normal breathing effort, [place] O2   ?CVS/Heart No rub or gallop, irregular  ?Abdomen:  Soft  ?Extremities: 2+  pitting edema bilateral lower extremities,  ?Neurologic: Resting quietly  ?Skin: Bilateral lower extremity wounds covered  ?Access: Left upper arm AV fistula  ?   ? ? ?Basic Metabolic Panel: ? ?Recent Labs  ?Lab 10/19/21 ?6945 10/20/21 ?0507 10/22/21 ?1926 10/25/21 ?0251  ?NA 137 137 140 136  ?K 4.2 4.2 4.0 4.8  ?CL 96* 97* 101 96*  ?CO2 28 28 28 27   ?GLUCOSE 95 106* 120* 98  ?BUN 36* 50* 27* 56*  ?CREATININE 5.46* 7.09* 4.78* 7.70*  ?CALCIUM 8.3* 8.2* 8.6* 7.8*  ?MG  --   --   --  2.1  ?PHOS  --   --   --  6.8*  ? ? ? ? ?CBC: ?Recent Labs  ?Lab 10/19/21 ?0388 10/20/21 ?0507 10/22/21 ?1926 10/25/21 ?0251  ?WBC 8.2 7.7 14.1* 8.3  ?NEUTROABS 5.5 4.9 12.1* 5.8  ?HGB 8.4* 8.1* 8.4* 7.4*  ?HCT 26.0* 25.5* 26.7* 23.1*  ?MCV 87.2 87.3 89.0 87.2  ?PLT 119* 120* 139* 131*  ? ? ? ?  ?Lab Results  ?Component Value Date  ? HEPBSAG NON REACTIVE 10/21/2021  ? HEPBSAB Reactive (A) 10/21/2021  ? HEPBIGM Negative 11/15/2018  ? ? ? ? ?Microbiology: ? ?Recent Results (from the past 240 hour(s))  ?CULTURE, BLOOD (ROUTINE X 2) w Reflex to ID Panel     Status: None  ? Collection Time: 10/18/21  4:39 AM  ? Specimen: Right Antecubital; Blood  ?Result Value Ref Range Status  ? Specimen Description RIGHT ANTECUBITAL  Final  ? Special Requests   Final  ?  BOTTLES DRAWN AEROBIC AND ANAEROBIC Blood Culture adequate volume  ? Culture   Final  ?  NO GROWTH 5 DAYS ?Performed at [facility], [address], [place], [place] [address] ?  ? Report Status 10/23/2021 FINAL  Final  ?CULTURE, BLOOD (ROUTINE X 2) w Reflex to ID Panel     Status: None  ? Collection Time: 10/18/21  4:44 AM  ? Specimen: BLOOD RIGHT FOREARM  ?Result Value Ref Range Status  ? Specimen Description BLOOD RIGHT FOREARM  Final  ? Special Requests   Final  ?  BOTTLES DRAWN AEROBIC AND ANAEROBIC Blood Culture results may not be optimal due to an excessive volume of blood received in culture  bottles  ? Culture   Final  ?  NO GROWTH 5 DAYS ?Performed at [facility], [address] [place], [place] [address] ?  ? Report Status 10/23/2021 FINAL  Final  ?Aerobic Culture w Gram Stain (superficial specimen)     Status: None  ? Collection Time: 10/20/21  2:29 PM  ? Specimen: Wound  ?Result Value Ref Range Status  ? Specimen Description   Final  ?  WOUND RL ?Performed at [facility], [address], [place], [place] [address] ?  ? Special Requests   Final  ?  NONE ?Performed at [facility], [address] [place], [place] [address] ?  ? Gram Stain   Final  ?  RARE WBC PRESENT,BOTH PMN AND MONONUCLEAR ?RARE GRAM NEGATIVE RODS ?Performed at [facility], [place] [address], [place], [place] [address] ?  ? Culture MODERATE PSEUDOMONAS AERUGINOSA  Final  ? Report Status 10/24/2021 FINAL  Final  ? Organism ID, Bacteria PSEUDOMONAS AERUGINOSA  Final  ?    Susceptibility  ? Pseudomonas aeruginosa - MIC*  ?  CEFTAZIDIME >=64 RESISTANT Resistant   ?  CIPROFLOXACIN 0.5 SENSITIVE Sensitive   ?  GENTAMICIN 8 INTERMEDIATE Intermediate   ?  IMIPENEM <=0.25 SENSITIVE Sensitive   ?  CEFEPIME >=32 RESISTANT Resistant   ?  * MODERATE PSEUDOMONAS AERUGINOSA  ? ? ? ?Coagulation Studies: ?No results for input(s): LABPROT, INR in the last 72 hours. ? ?Urinalysis: ?No results for input(s): COLORURINE, LABSPEC, [place], GLUCOSEU, HGBUR, BILIRUBINUR, KETONESUR, PROTEINUR, UROBILINOGEN, NITRITE, LEUKOCYTESUR in the last 72 hours. ? ?Invalid input(s): APPERANCEUR  ? ? ?Imaging: ?No results found. ? ? ?Medications:  ? ? ceFEPime (MAXIPIME) IV    ? vancomycin    ? ? apixaban  5 mg Oral BID  ? atorvastatin  40 mg Oral QHS  ? bumetanide  2 mg Oral Daily  ? carvedilol  6.25 mg Oral BID  ? Chlorhexidine Gluconate Cloth  6 each Topical Q0600  ? polyethylene glycol  17 g Oral Daily  ? tamsulosin  0.4 mg Oral Daily  ? ?acetaminophen **OR** acetaminophen, albuterol, heparin, HYDROmorphone (DILAUDID) injection,  ondansetron **OR** ondansetron (ZOFRAN) IV, oxyCODONE ? ?Assessment/ Plan:  ?66 y.o. male with end-stage renal disease, hypertension, atrial fibrillation, coronary artery disease, pulmonary embolism, hyperlipidemia, polysubstance abuse, history of chronic pain syndrome ?Admission at [facility] for acute respiratory failure, COVID-19  in January 2023 ?This time he is admitted on 10/22/2021 for ?Bilateral lower leg cellulitis [L03.116, L03.115] ? ?[place]  240 minutes.  Left arm AV fistula.  15-gauge needles.  3 times per week.  MWF.  Target weight 120 kg. ? ?#End-stage renal disease with LE edema ?Currently receiving dialysis seated in chair. Continues to have mild pain, will monitor  during treatment. Will receive antibiotics with treatment. Next treatment scheduled for Wednesday. Dialysis coordinator aware of patient and monitoring discharge planning.  ? ? ?#Anemia of chronic kidney disease ?Lab Results  ?Component Value Date  ? HGB 7.4 (L) 10/25/2021  ?Patient to resume Mircera as an outpatient. ?Will order EPO 10000 units with dialysis ? ?#Secondary hyperparathyroidism ?Lab Results  ?Component Value Date  ? PTH 758 (H) 08/15/2018  ? CALCIUM 7.8 (L) 10/25/2021  ? PHOS 6.8 (H) 10/25/2021  ?Low phosphorus diet recommended. Calcium decreased, but should correct with dialysis ? ?#Right lower extremity wound drainage possible cellulitis ?Bactrim single strength twice daily ordered outpatient.  Currently receiving cefepime daily and vancomycin IV with dialysis. ? ID following and recommending Vancomycin and Ceftazidine  End date is 10/28/2021. Will give during dialysis ? ? LOS: 3 ?[place] ?4/17/202312:14 PM ? ?[facility] ?[place], [place] ?[phone] ? ?

## 2021-10-25 NOTE — Plan of Care (Signed)

## 2021-10-25 NOTE — Progress Notes (Signed)
PT Cancellation Note ? ?Patient Details ?Name: [name] ?MRN: [id number] ?DOB: 1955/09/18 ? ? ?Cancelled Treatment:    Reason Eval/Treat Not Completed: Patient at procedure or test/unavailable Attempted to see pt for PT tx but pt noted to be off the floor at hemodialysis. Will f/u as able & as pt is available. ? ?Lavone Nian, PT, DPT ?10/25/21, 11:39 AM ? ? ?Waunita Schooner ?10/25/2021, 11:38 AM ?

## 2021-10-25 NOTE — Progress Notes (Signed)
Hemodialysis Post Treatment Note  ? ?25 October 2021 ? ?Access:  Left Upper Arm AVF ? ?Treatment Time: 3.5 hrs. ? ?UF Removed: 2058 ml ? ?Next Scheduled Treatment: 10/27/21 ? ?Note: ?Pt. completes 3.5 hr. Tx without incident. AVF functions well maintains prescribed blood flow rate, minimal bleeding post treatment. Pt. receives ordered antibiotics without adverse reaction. Pt continues with chronic pain to bilateral lower extremities, medicated x1 with temporary relief.  Pt vital signs stable, report given to primary nurse, transported to assigned room.  ? ?

## 2021-10-26 DIAGNOSIS — L03116 Cellulitis of left lower limb: Secondary | ICD-10-CM | POA: Diagnosis not present

## 2021-10-26 DIAGNOSIS — L03115 Cellulitis of right lower limb: Secondary | ICD-10-CM | POA: Diagnosis not present

## 2021-10-26 LAB — RENAL FUNCTION PANEL
Albumin: 2.5 g/dL — ABNORMAL LOW (ref 3.5–5.0)
Anion gap: 12 (ref 5–15)
BUN: 33 mg/dL — ABNORMAL HIGH (ref 8–23)
CO2: 28 mmol/L (ref 22–32)
Calcium: 8.5 mg/dL — ABNORMAL LOW (ref 8.9–10.3)
Chloride: 97 mmol/L — ABNORMAL LOW (ref 98–111)
Creatinine, Ser: 5.21 mg/dL — ABNORMAL HIGH (ref 0.61–1.24)
GFR, Estimated: 12 mL/min — ABNORMAL LOW (ref >60)
Glucose, Bld: 105 mg/dL — ABNORMAL HIGH (ref 70–99)
Phosphorus: 5.3 mg/dL — ABNORMAL HIGH (ref 2.5–4.6)
Potassium: 4.4 mmol/L (ref 3.5–5.1)
Sodium: 137 mmol/L (ref 135–145)

## 2021-10-26 LAB — CBC WITH DIFFERENTIAL/PLATELET
Abs Immature Granulocytes: 0.05 10*3/uL (ref 0.00–0.07)
Basophils Absolute: 0.1 10*3/uL (ref 0.0–0.1)
Basophils Relative: 1 %
Eosinophils Absolute: 0.2 10*3/uL (ref 0.0–0.5)
Eosinophils Relative: 3 %
HCT: 24.9 % — ABNORMAL LOW (ref 39.0–52.0)
Hemoglobin: 7.9 g/dL — ABNORMAL LOW (ref 13.0–17.0)
Immature Granulocytes: 1 %
Lymphocytes Relative: 16 %
Lymphs Abs: 1.2 10*3/uL (ref 0.7–4.0)
MCH: 27.7 pg (ref 26.0–34.0)
MCHC: 31.7 g/dL (ref 30.0–36.0)
MCV: 87.4 fL (ref 80.0–100.0)
Monocytes Absolute: 0.9 10*3/uL (ref 0.1–1.0)
Monocytes Relative: 12 %
Neutro Abs: 5.1 10*3/uL (ref 1.7–7.7)
Neutrophils Relative %: 67 %
Platelets: 136 10*3/uL — ABNORMAL LOW (ref 150–400)
RBC: 2.85 MIL/uL — ABNORMAL LOW (ref 4.22–5.81)
RDW: 16 % — ABNORMAL HIGH (ref 11.5–15.5)
WBC: 7.5 10*3/uL (ref 4.0–10.5)
nRBC: 0 % (ref 0.0–0.2)

## 2021-10-26 LAB — MAGNESIUM: Magnesium: 1.9 mg/dL (ref 1.7–2.4)

## 2021-10-26 MED ORDER — OXYCODONE HCL ER 10 MG PO T12A
10.0000 mg | EXTENDED_RELEASE_TABLET | Freq: Two times a day (BID) | ORAL | Status: DC
  Administered 2021-10-26: 10 mg via ORAL
  Filled 2021-10-26: qty 1

## 2021-10-26 MED ORDER — TRAMADOL HCL 50 MG PO TABS
50.0000 mg | ORAL_TABLET | Freq: Four times a day (QID) | ORAL | Status: DC | PRN
  Filled 2021-10-26: qty 1

## 2021-10-26 MED ORDER — OXYCODONE HCL 5 MG PO TABS: 10.0000 mg | ORAL_TABLET | Freq: Four times a day (QID) | ORAL | Status: DC | PRN

## 2021-10-26 MED ORDER — OXYCODONE HCL ER 10 MG PO T12A: 10.0000 mg | EXTENDED_RELEASE_TABLET | Freq: Two times a day (BID) | ORAL | 0 refills | Status: AC

## 2021-10-26 MED ORDER — OXYCODONE HCL ER 10 MG PO T12A: 20.0000 mg | EXTENDED_RELEASE_TABLET | Freq: Two times a day (BID) | ORAL | Status: DC

## 2021-10-26 MED ORDER — TRAMADOL HCL 50 MG PO TABS: 50.0000 mg | ORAL_TABLET | Freq: Four times a day (QID) | ORAL | 0 refills | Status: AC | PRN

## 2021-10-26 MED ORDER — OXYCODONE HCL 5 MG PO TABS
5.0000 mg | ORAL_TABLET | Freq: Four times a day (QID) | ORAL | Status: DC | PRN
  Filled 2021-10-26: qty 1

## 2021-10-26 NOTE — Progress Notes (Signed)
[place] was called and report was given to nurse Janett Billow. Patient is waiting EMS transport. ?

## 2021-10-26 NOTE — Progress Notes (Signed)
[facility] ?[place], [place] ?10/26/21 ? ?Subjective:  ? ?Hospital day # 4 ? ?[name] 66 year old African-American male with past medical history including persistent atrial fibrillation on Eliquis, hypertension, hyperlipidemia, GERD, BPH, and end-stage renal disease on hemodialysis.  Patient presents to the emergency department from [facility] with complaints of right lower extremity pain and purulent drainage for 2 days.  Patient has been admitted for Bilateral lower leg cellulitis [L03.116, L03.115] ? ?Patient is known to our practice and receives outpatient dialysis treatments at this [facility] on a Monday Wednesday Friday schedule.   ? ?Patient sitting in chair, states leg pain is less when seated ?Tolerating meals ?Reports leg wounds improved during admission ? ? ?Objective:  ?Vital signs in last 24 hours:  ?Temp:  [97.4 ?F (36.3 ?C)-98.3 ?F (36.8 ?C)] 98.3 ?F (36.8 ?C) (04/18 0902) ?Pulse Rate:  [25-156] 95 (04/18 0902) ?Resp:  [14-23] 18 (04/18 0902) ?BP: (98-136)/(58-118) 118/73 (04/18 0902) ?SpO2:  [76 %-100 %] 99 % (04/18 0902) ?Weight:  [119.6 kg] 119.6 kg (04/17 1500) ? ?Weight change:  ?Filed Weights  ? 10/22/21 1911 10/25/21 1037 10/25/21 1500  ?Weight: 120 kg 120.9 kg 119.6 kg  ? ? ?Intake/Output: ?  ? ?Intake/Output Summary (Last 24 hours) at 10/26/2021 1309 ?Last data filed at 10/25/2021 1700 ?Gross per 24 hour  ?Intake 240 ml  ?Output 2058 ml  ?Net -1818 ml  ? ? ? ? ?Physical Exam: ?General: NAD  ?HEENT Anicteric, moist oral mucous membranes  ?Pulm/lungs Normal breathing effort, [place] O2  ?CVS/Heart No rub or gallop, irregular  ?Abdomen:  Soft  ?Extremities: 2+  pitting edema bilateral lower extremities,  ?Neurologic: Resting quietly  ?Skin: Bilateral lower extremity wounds covered  ?Access: Left upper arm AV fistula  ?   ? ? ?Basic Metabolic Panel: ? ?Recent Labs  ?Lab 10/20/21 ?0507 10/22/21 ?1926 10/25/21 ?0251 10/26/21 ?0401  ?NA 137 140 136 137  ?K  4.2 4.0 4.8 4.4  ?CL 97* 101 96* 97*  ?CO2 28 28 27 28   ?GLUCOSE 106* 120* 98 105*  ?BUN 50* 27* 56* 33*  ?CREATININE 7.09* 4.78* 7.70* 5.21*  ?CALCIUM 8.2* 8.6* 7.8* 8.5*  ?MG  --   --  2.1 1.9  ?PHOS  --   --  6.8* 5.3*  ? ? ? ? ?CBC: ?Recent Labs  ?Lab 10/20/21 ?0507 10/22/21 ?1926 10/25/21 ?0251 10/26/21 ?0401  ?WBC 7.7 14.1* 8.3 7.5  ?NEUTROABS 4.9 12.1* 5.8 5.1  ?HGB 8.1* 8.4* 7.4* 7.9*  ?HCT 25.5* 26.7* 23.1* 24.9*  ?MCV 87.3 89.0 87.2 87.4  ?PLT 120* 139* 131* 136*  ? ? ? ?  ?Lab Results  ?Component Value Date  ? HEPBSAG NON REACTIVE 10/21/2021  ? HEPBSAB Reactive (A) 10/21/2021  ? HEPBIGM Negative 11/15/2018  ? ? ? ? ?Microbiology: ? ?Recent Results (from the past 240 hour(s))  ?CULTURE, BLOOD (ROUTINE X 2) w Reflex to ID Panel     Status: None  ? Collection Time: 10/18/21  4:39 AM  ? Specimen: Right Antecubital; Blood  ?Result Value Ref Range Status  ? Specimen Description RIGHT ANTECUBITAL  Final  ? Special Requests   Final  ?  BOTTLES DRAWN AEROBIC AND ANAEROBIC Blood Culture adequate volume  ? Culture   Final  ?  NO GROWTH 5 DAYS ?Performed at [facility], [address] [place], [place] [address] ?  ? Report Status 10/23/2021 FINAL  Final  ?CULTURE, BLOOD (ROUTINE X 2) w Reflex to ID Panel  Status: None  ? Collection Time: 10/18/21  4:44 AM  ? Specimen: BLOOD RIGHT FOREARM  ?Result Value Ref Range Status  ? Specimen Description BLOOD RIGHT FOREARM  Final  ? Special Requests   Final  ?  BOTTLES DRAWN AEROBIC AND ANAEROBIC Blood Culture results may not be optimal due to an excessive volume of blood received in culture bottles  ? Culture   Final  ?  NO GROWTH 5 DAYS ?Performed at [facility], [address], [place], [place] [address] ?  ? Report Status 10/23/2021 FINAL  Final  ?Aerobic Culture w Gram Stain (superficial specimen)     Status: None  ? Collection Time: 10/20/21  2:29 PM  ? Specimen: Wound  ?Result Value Ref Range Status  ? Specimen Description   Final  ?  WOUND  RL ?Performed at [facility], [address], [place], [place] [address] ?  ? Special Requests   Final  ?  NONE ?Performed at [facility], [address], [place], [place] [address] ?  ? Gram Stain   Final  ?  RARE WBC PRESENT,BOTH PMN AND MONONUCLEAR ?RARE GRAM NEGATIVE RODS ?Performed at [facility], [place] [address], [place],  [address] ?  ? Culture MODERATE PSEUDOMONAS AERUGINOSA  Final  ? Report Status 10/24/2021 FINAL  Final  ? Organism ID, Bacteria PSEUDOMONAS AERUGINOSA  Final  ?    Susceptibility  ? Pseudomonas aeruginosa - MIC*  ?  CEFTAZIDIME >=64 RESISTANT Resistant   ?  CIPROFLOXACIN 0.5 SENSITIVE Sensitive   ?  GENTAMICIN 8 INTERMEDIATE Intermediate   ?  IMIPENEM <=0.25 SENSITIVE Sensitive   ?  CEFEPIME >=32 RESISTANT Resistant   ?  * MODERATE PSEUDOMONAS AERUGINOSA  ? ? ? ?Coagulation Studies: ?No results for input(s): LABPROT, INR in the last 72 hours. ? ?Urinalysis: ?No results for input(s): COLORURINE, LABSPEC, [place], GLUCOSEU, HGBUR, BILIRUBINUR, KETONESUR, PROTEINUR, UROBILINOGEN, NITRITE, LEUKOCYTESUR in the last 72 hours. ? ?Invalid input(s): APPERANCEUR  ? ? ?Imaging: ?No results found. ? ? ?Medications:  ? ? ceFEPime (MAXIPIME) IV Stopped (10/25/21 1335)  ? vancomycin Stopped (10/25/21 1420)  ? ? apixaban  5 mg Oral BID  ? atorvastatin  40 mg Oral QHS  ? bumetanide  2 mg Oral Daily  ? carvedilol  6.25 mg Oral BID  ? Chlorhexidine Gluconate Cloth  6 each Topical Q0600  ? epoetin (EPOGEN/PROCRIT) injection  10,000 Units Intravenous Q M,W,F-HD  ? oxyCODONE  10 mg Oral Q12H  ? polyethylene glycol  17 g Oral Daily  ? tamsulosin  0.4 mg Oral Daily  ? ?acetaminophen **OR** acetaminophen, albuterol, ondansetron **OR** ondansetron (ZOFRAN) IV, traMADol ? ?Assessment/ Plan:  ?66 y.o. male with end-stage renal disease, hypertension, atrial fibrillation, coronary artery disease, pulmonary embolism, hyperlipidemia, polysubstance abuse, history of chronic pain  syndrome ?Admission at [facility] for acute respiratory failure, COVID-19  in January 2023 ?This time he is admitted on 10/22/2021 for ?Bilateral lower leg cellulitis [L03.116, L03.115] ? ?[place]  240 minutes.  Left arm AV fistula.  15-gauge needles.  3 times per week.  MWF.  Target weight 120 kg. ? ?#End-stage renal disease with LE edema ?Received dialysis yesterday, UF goal 2L achieved. Next treatment scheduled for Wednesday. Dialysis coordinator monitoring discharge planning to determine outpatient needs.  ? ? ?#Anemia of chronic kidney disease ?Lab Results  ?Component Value Date  ? HGB 7.9 (L) 10/26/2021  ?Patient to resume Mircera as an outpatient. ?EPO 10000 units with dialysis ? ?#  Secondary hyperparathyroidism ?Lab Results  ?Component Value Date  ? PTH 758 (H) 08/15/2018  ? CALCIUM 8.5 (L) 10/26/2021  ? PHOS 5.3 (H) 10/26/2021  ?Low phosphorus diet recommended.  ?Calcium and phosphorus within acceptable range ? ?#Right lower extremity wound drainage possible cellulitis ?Bactrim single strength twice daily ordered outpatient.  Currently receiving cefepime daily and vancomycin IV with dialysis. ? ID following and recommending Vancomycin and Ceftazidine  End date is 10/28/2021. Will notify outpatient clinic ? ? LOS: 4 ?[place] ?4/18/20231:09 PM ? ?[facility] ?[place], [place] ?[phone] ? ?

## 2021-10-26 NOTE — Progress Notes (Signed)
Pharmacy Antibiotic Note ? ?[name] is a 66 y.o. male admitted on 10/22/2021 with  wound infection .  Pharmacy has been consulted for Vanc, Cefepime dosing.  Pt was d/c from [facility] on 4/13 , was on vanc 1 gm IV MWF-HD and Cefepime 2 gm IV MWF.  Pt was ordered to have vanc and cefepime continued as outpt X 10 days.   MD wants to continue current regimen.  Pt received HD on 4/14 so will assume he received his abx since he is from SNF.  ? ?Per infectious disease's note on his previous admit, planned to continue antibiotics until 10/28/21(last day will technically be Wednesday, 4/19)  ? ?Plan: ?Continue Vancomycin 1 gm IV and Cefepime 2 gm IV Q MWF - HD until 4/19(stop date in place) ? ? ?Height: 6' (182.9 cm) ?Weight: 119.6 kg (263 lb 10.7 oz) ?IBW/kg (Calculated) : 77.6 ? ?Temp (24hrs), Avg:97.8 ?F (36.6 ?C), Min:97.4 ?F (36.3 ?C), Max:98.3 ?F (36.8 ?C) ? ?Recent Labs  ?Lab 10/20/21 ?0507 10/22/21 ?1926 10/25/21 ?0251 10/26/21 ?0401  ?WBC 7.7 14.1* 8.3 7.5  ?CREATININE 7.09* 4.78* 7.70* 5.21*  ? ?  ?Estimated Creatinine Clearance: 19.4 mL/min (A) (by C-G formula based on SCr of 5.21 mg/dL (H)).   ? ?Allergies  ?Allergen Reactions  ? Cyclobenzaprine Nausea Only  ? Amlodipine   ? Clonidine Other (See Comments)  ?  Asymptomatic bradycardia to 38  ? Furosemide Other (See Comments)  ?  Caused renal failure  ? Tylenol [Acetaminophen] Other (See Comments)  ?  Reaction:  Pt states it bothers his liver  ? ? ?Antimicrobials this admission: ?4/15  >> cefepime ? 4/15 >> Vancomycin ? ?Dose adjustments this admission: ? ? ?Microbiology results: ?- see previous admission ? ?Thank you for allowing pharmacy to be a part of this patient?s care. ? ?Pearla Dubonnet, PharmD ?Clinical Pharmacist ?10/26/2021 ?11:13 AM ? ? ?

## 2021-10-26 NOTE — NC FL2 (Signed)
?[place] MEDICAID FL2 LEVEL OF CARE SCREENING TOOL  ?  ? ?IDENTIFICATION  ?Patient Name: ?[name] Birthdate: June 26, 1956 Sex: male Admission Date (Current Location): ?10/22/2021  ?[place] and [place] Number: ? [place] ?  Facility and Address:  ?[facility], [address] [place], [place] [address] ?     Provider Number: ?[id number]  ?Attending Physician Name and Address:  ?Fritzi Mandes, MD ? Relative Name and Phone Number:  ?Mathias, Bogacki (Father)   [phone] [facility] Phone) ?   ?Current Level of Care: ?Hospital Recommended Level of Care: ?[place] Prior Approval Number: ?  ? ?Date Approved/Denied: ?  PASRR Number: ?[id number] A ? ?Discharge Plan: ?  ?  ? ?Current Diagnoses: ?Patient Active Problem List  ? Diagnosis Date Noted  ? Bilateral lower leg cellulitis 10/22/2021  ? Physical deconditioning 10/22/2021  ? Venous stasis of both lower extremities 10/22/2021  ? Cellulitis 10/18/2021  ? Multiple open wounds of left lower leg 10/02/2021  ? Multiple open wounds of lower leg 10/01/2021  ? Hyperkalemia 08/08/2021  ? Acute hypoxemic respiratory failure ([place]) 08/08/2021  ? Lymphedema associated with obesity 08/07/2021  ? OSA on CPAP 08/07/2021  ? Cirrhosis of liver ([place]) 06/16/2021  ? Esophageal varices ([place]) 06/16/2021  ? Hyperphosphatemia 06/15/2021  ? Persistent atrial fibrillation ([place])   ? HFrEF (heart failure with reduced ejection fraction) ([place])   ? Chest pain 01/05/2021  ? Cough   ? CAD (coronary artery disease) 01/05/2020  ? Chronic anticoagulation 01/05/2020  ? Acute bronchitis 01/05/2020  ? AF (paroxysmal atrial fibrillation) ([place]) 01/05/2020  ? Intractable pain 10/24/2019  ? History of substance abuse ([place]) 10/24/2019  ? Elevated troponin 10/24/2019  ? Nonspecific chest pain 10/24/2019  ? Chronic diastolic CHF (congestive heart failure) ([place]) 09/28/2019  ? History of MI (myocardial infarction) 09/28/2019  ? Anemia of chronic kidney failure, stage 5 ([place])  09/28/2019  ? History of pulmonary embolism 09/28/2019  ? Chronic midline low back pain   ? Acute respiratory failure with hypoxia ([place])   ? Acute diastolic CHF (congestive heart failure) ([place])   ? Anemia of chronic disease   ? Thrombocytopenia ([place])   ? Congestive heart failure ([place])   ? Fluid overload 06/21/2019  ? Volume overload 11/15/2018  ? ESRD (end stage renal disease) on dialysis ([place]) 11/14/2018  ? HCAP (healthcare-associated pneumonia) 08/27/2018  ? SOB (shortness of breath) 08/25/2018  ? Chest pain of uncertain etiology 68/34/1962  ? ESRD on dialysis ([place]) 11/08/2016  ? Prostate cancer screening 11/08/2016  ? Acquired cyst of kidney 11/08/2016  ? Urinary urgency 11/08/2016  ? Acute on chronic renal failure ([place]) 09/23/2016  ? Hypertensive urgency 09/21/2016  ? Dysthymia 02/26/2016  ? Chronic pain 02/26/2016  ? Noncompliance with renal dialysis 02/26/2016  ? Opiate abuse, continuous ([place]) 02/03/2016  ? Substance induced mood disorder ([place]) 02/03/2016  ? Antisocial personality disorder ([place]) 02/03/2016  ? Left-sided weakness 01/18/2016  ? HEPATITIS C 06/28/2008  ? HLD (hyperlipidemia) 06/28/2008  ? SUBSTANCE ABUSE, MULTIPLE 06/28/2008  ? Hypertension 06/27/2008  ? ? ?Orientation RESPIRATION BLADDER Height & Weight   ?  ?Self, Time, Situation, Place ? Normal Continent Weight: 119.6 kg ?Height:  6' (182.9 cm)  ?BEHAVIORAL SYMPTOMS/MOOD NEUROLOGICAL BOWEL NUTRITION STATUS  ?    Continent Diet (heart)  ?AMBULATORY STATUS COMMUNICATION OF NEEDS Skin   ?Extensive Assist Verbally Skin abrasions ?  ?  ?  ?    ?     ?     ? ? ?  Personal Care Assistance Level of Assistance  ?Bathing, Feeding, Dressing Bathing Assistance: Maximum assistance ?Feeding assistance: Limited assistance ?Dressing Assistance: Maximum assistance ?   ? ?Functional Limitations Info  ?    ?  ?   ? ? ?SPECIAL CARE FACTORS FREQUENCY  ?PT (By licensed PT), OT (By licensed OT)   ?  ?PT Frequency: 5 times per week ?OT Frequency: 5 times per week ?  ?   ?  ?   ? ? ?Contractures    ? ? ?Additional Factors Info  ?Code Status, Allergies Code Status Info: full ?Allergies Info: Cyclobenzaprine, Amlodipine, Clonidine, Furosemide, Tylenol (Acetaminophen) ?  ?  ?  ?   ? ?Current Medications (10/26/2021):  This is the current hospital active medication list ?Current Facility-Administered Medications  ?Medication Dose Route Frequency Provider Last Rate Last Admin  ? acetaminophen (TYLENOL) tablet 650 mg  650 mg Oral Q6H PRN Athena Masse, MD      ? Or  ? acetaminophen (TYLENOL) suppository 650 mg  650 mg Rectal Q6H PRN Athena Masse, MD      ? albuterol (PROVENTIL) (2.5 MG/3ML) 0.083% nebulizer solution 3 mL  3 mL Inhalation Q6H PRN Athena Masse, MD      ? apixaban Arne Cleveland) tablet 5 mg  5 mg Oral BID Athena Masse, MD   5 mg at 10/26/21 5053  ? atorvastatin (LIPITOR) tablet 40 mg  40 mg Oral QHS Athena Masse, MD   40 mg at 10/25/21 2033  ? bumetanide (BUMEX) tablet 2 mg  2 mg Oral Daily Athena Masse, MD   2 mg at 10/26/21 9767  ? carvedilol (COREG) tablet 6.25 mg  6.25 mg Oral BID Athena Masse, MD   6.25 mg at 10/26/21 3419  ? ceFEPIme (MAXIPIME) 2 g in sodium chloride 0.9 % 100 mL IVPB  2 g Intravenous Q M,W,F-HD Dorothe Pea, [facility]   Stopped at 10/25/21 1335  ? Chlorhexidine Gluconate Cloth 2 % PADS 6 each  6 each Topical Q0600 Murlean Iba, MD   6 each at 10/26/21 0516  ? epoetin alfa (EPOGEN) injection 10,000 Units  10,000 Units Intravenous Q M,W,F-HD Colon Flattery, NP   10,000 Units at 10/25/21 1233  ? ondansetron (ZOFRAN) tablet 4 mg  4 mg Oral Q6H PRN Athena Masse, MD      ? Or  ? ondansetron [facility]) injection 4 mg  4 mg Intravenous Q6H PRN Athena Masse, MD      ? oxyCODONE (Oxy IR/ROXICODONE) immediate release tablet 10 mg  10 mg Oral Q6H PRN Fritzi Mandes, MD      ? oxyCODONE (OXYCONTIN) 12 hr tablet 20 mg  20 mg Oral Q12H Fritzi Mandes, MD      ? polyethylene glycol (MIRALAX / GLYCOLAX) packet 17 g  17 g Oral Daily Athena Masse, MD    17 g at 10/24/21 3790  ? tamsulosin (FLOMAX) capsule 0.4 mg  0.4 mg Oral Daily Athena Masse, MD   0.4 mg at 10/26/21 2409  ? vancomycin (VANCOCIN) IVPB 1000 mg/200 mL premix  1,000 mg Intravenous Q M,W,F-HD Dorothe Pea, [facility]   Stopped at 10/25/21 1420  ? ? ? ?Discharge Medications: ?Please see discharge summary for a list of discharge medications. ? ?Relevant Imaging Results: ? ?Relevant Lab Results: ? ? ?Additional Information ?SS #: [phone]. HD [place] MWF 11:00 am ? ?Conception Oms, RN ? ? ? ? ?

## 2021-10-26 NOTE — Progress Notes (Signed)
Vitals entered manually ° °

## 2021-10-26 NOTE — Progress Notes (Signed)
PT Cancellation Note ? ?Patient Details ?Name: [name] ?MRN: [id number] ?DOB: [birth year] ? ? ?Cancelled Treatment:     Therapist in to see pt for continued PT services, pt declined stating he was awaiting EMS to transport him to [place].  Continue PT next available date/time if discharge plans change. ? ? ?Josie Dixon ?10/26/2021, 3:32 PM ?

## 2021-10-26 NOTE — Discharge Instructions (Signed)
Dressing changes as per instruction ?patient will resume hemodialysis Monday Wednesday Friday. ?

## 2021-10-26 NOTE — Progress Notes (Signed)
[place] at [facility] ? ? ?PATIENT NAME: [name]   ? ?MR#:  [id number] ? ?DATE OF BIRTH:  08-Nov-1955 ? ?SUBJECTIVE:  ?patient out in the chair. Complains of pain chronically. Per RN patient keeps asking for pain medicine quite frequently. Tolerating in-house dialysis. Gets antibiotics with dialysis. ? ?VITALS:  ?Blood pressure 118/73, pulse 95, temperature 98.3 ?F (36.8 ?C), resp. rate 18, height 6' (1.829 m), weight 119.6 kg, SpO2 99 %. ? ?PHYSICAL EXAMINATION:  ? ?GENERAL:  66 y.o.-year-old patient lying in the bed with no acute distress.  ?LUNGS: Normal breath sounds bilaterally, no wheezing, rales, rhonchi.  ?CARDIOVASCULAR: S1, S2 normal. No murmurs, rubs, or gallops.  ?ABDOMEN: Soft, nontender, nondistended. Bowel sounds present.  ?EXTREMITIES: bilateral chronic lower extremity edema with chronic leg ulcers -- wrapped  ?NEUROLOGIC: nonfocal  patient is alert and awake ?SKIN as above ?LABORATORY PANEL:  ?CBC ?Recent Labs  ?Lab 10/26/21 ?0401  ?WBC 7.5  ?HGB 7.9*  ?HCT 24.9*  ?PLT 136*  ? ? ?Chemistries  ?Recent Labs  ?Lab 10/26/21 ?0401  ?NA 137  ?K 4.4  ?CL 97*  ?CO2 28  ?GLUCOSE 105*  ?BUN 33*  ?CREATININE 5.21*  ?CALCIUM 8.5*  ?MG 1.9  ? ? ?Assessment and Plan ? ?[name] is a 66 y.o. male with medical history significant for ESRD on HD MWF, HTN, A-fib on Eliquis, cirrhosis with esophageal varices, HFrEF (EF 40 to 45%) chronic venous stasis, recently hospitalized from 4/10 to 4/13 for bilateral lower extremity cellulitis, treated with IV vancomycin and cefepime (end date 4/20) and discharged home with home health. ?He returned back to the hospital after falling at home and being unable to get up and too much work for his brother. ?He was having ongoing intractable leg pain related to his cellulitis. ?He was restarted back on his antibiotics and admitted for probable placement in rehab. ?  ?  ?Assessment and Plan: ?Bilateral lower leg cellulitis ?chronic leg  pain ?- Continue IV vancomycin and cefepime as per regimen; end date 4/20 per last ID note ?- Keep legs elevated ?- Per recent wound care recommendation: ?Daily dressing changes using a silver hydrofiber (Aquacel Ag+ Advantage covered with ABD pads for antimicrobial donation and absorption, and secure with dry boot (Kerlix and ACE) over Unna's boot at this time and change daily.  ?-Pressure redistribution heel boots and pressure redistribution chair cushion for pressure injury prevention when OOB, as the patient sleeps in a recliner chair ? -- will change to OxyContin IR 20 mg BID and PRN oxycodone 10 mg Q6 PRN-- in preparation for discharge to rehab. Discontinue IV Dilaudid ?  ?Physical deconditioning ?- significant deconditioning from last hospitalization ?- now amenable to SNF-- excepted bed at [facility] ? ?ESRD on dialysis [facility]) ?- Nephrology following for continuation of dialysis ?- Schedule is MWF typically ?  ?Venous stasis of both lower extremities ?Keep leg elevated ?Continue Unna boots, Lasix ?Other management as above ?  ?Intractable pain ?- continue pain control ?  ?Esophageal varices (HCC) ?Related to cirrhosis.  No evidence of acute bleeding ?  ?Cirrhosis of liver ([place]) ?Not acutely decompensated ?  ?HFrEF (heart failure with reduced ejection fraction) ([place]) ?Most recent EF 40 to 45% ?Continue Lasix and carvedilol ?that's 99% on room air ?  ?AF (paroxysmal atrial fibrillation) ([place]) ?Continue carvedilol and apixaban ?  ?Chronic anticoagulation ?Continue apixaban for stroke prevention and A-fib as hemoglobin is stable and no evidence of active bleeding ?  ?  Anemia of chronic disease ?Hemoglobin at baseline and stable ?  ?  ?DVT prophylaxis:  ?  ?apixaban (ELIQUIS) tablet 5 mg  ?  ?Code Status:   Code Status: Full Code ?  ?Disposition Plan:  SNF [facility] likely 10/27/21 ? ? ?Consults : nephrology, ID ?Level of care: Med-Surg ?Status is: Inpatient ? ?  ? ?TOTAL TIME TAKING CARE OF THIS  PATIENT: 25 minutes.  ?>50% time spent on counselling and coordination of care ? ?Note: This dictation was prepared with Dragon dictation along with smaller phrase technology. Any transcriptional errors that result from this process are unintentional. ? ?Fritzi Mandes M.D  ? ? ?[facility]  ? ?CC: ?Primary care physician; [facility] [place]  ?

## 2021-10-26 NOTE — TOC Progression Note (Signed)
Transition of Care (TOC) - Progression Note  ? ? ?Patient Details  ?Name: [name] ?MRN: [id number] ?Date of Birth: 09-May-1956 ? ?Transition of Care (TOC) CM/SW Contact  ?Conception Oms, RN ?Phone Number: ?10/26/2021, 1:55 PM ? ?Clinical Narrative:    ? ?Patient going to [facility], EMS called ?Patient to make family aware ? ?Expected Discharge Plan: [place] ?Barriers to Discharge: Continued Medical Work up ? ?Expected Discharge Plan and Services ?Expected Discharge Plan: [place] ?  ?  ?  ?Living arrangements for the past 2 months: [place] ?Expected Discharge Date: 10/26/21               ?  ?  ?  ?  ?  ?  ?  ?  ?  ?  ? ? ?Social Determinants of Health (SDOH) Interventions ?  ? ?Readmission Risk Interventions ? ?  10/24/2021  ?  2:19 PM 10/03/2021  ?  1:48 PM  ?Readmission Risk Prevention Plan  ?Transportation Screening Complete Complete  ?Medication Review Press photographer) Complete Complete  ?PCP or Specialist appointment within 3-5 days of discharge Complete Complete  ?[place] or Home Care Consult Complete Complete  ?SW Recovery Care/Counseling Consult Complete   ?Palliative Care Screening Not Applicable Not Applicable  ?Skilled Nursing Facility Complete Not Applicable  ? ? ?

## 2021-10-26 NOTE — Plan of Care (Signed)
?  Problem: Education: ?Goal: Knowledge of General Education information will improve ?Description: Including pain rating scale, medication(s)/side effects and non-pharmacologic comfort measures ?Outcome: Progressing ?  ?Problem: Health Behavior/Discharge Planning: ?Goal: Ability to manage health-related needs will improve ?Outcome: Progressing ?  ?Problem: Clinical Measurements: ?Goal: Ability to maintain clinical measurements within normal limits will improve ?Outcome: Progressing ?  ?Problem: Clinical Measurements: ?Goal: Will remain free from infection ?Outcome: Progressing ?  ?Problem: Clinical Measurements: ?Goal: Diagnostic test results will improve ?Outcome: Progressing ?  ?Problem: Nutrition: ?Goal: Adequate nutrition will be maintained ?Outcome: Progressing ?  ?Problem: Skin Integrity: ?Goal: Risk for impaired skin integrity will decrease ?Outcome: Progressing ?  ?

## 2021-10-26 NOTE — Discharge Summary (Signed)
?Physician Discharge Summary ?  ?Patient: [name] MRN: [id number] DOB: [birth year]  ?Admit date:     10/22/2021  ?Discharge date: 10/26/21  ?Discharge Physician: Fritzi Mandes  ? ?PCP: [facility] [place]  ? ?Recommendations at discharge:  ? ?patient will follow-up with [facility] PCP after discharge from rehab ?continue hemodialysis Monday Wednesday Friday ?bilateral lower extremity wound care per instructions ? ?Discharge Diagnoses: ?bilateral lower extremity cellulitis ?chronic bilateral leg pain with chronic bilateral leg edema ?end-stage renal disease on hemodialysis ? ? ?[name] is a 66 y.o. male with medical history significant for ESRD on HD MWF, HTN, A-fib on Eliquis, cirrhosis with esophageal varices, HFrEF (EF 40 to 45%) chronic venous stasis, recently hospitalized from 4/10 to 4/13 for bilateral lower extremity cellulitis, treated with IV vancomycin and cefepime (end date 4/20) and discharged home with home health. ?He returned back to the hospital after falling at home and being unable to get up and too much work for his brother. ?He was having ongoing intractable leg pain related to his cellulitis. ?He was restarted back on his antibiotics and admitted for probable placement in rehab. ?  ?Bilateral lower leg cellulitis ?chronic leg pain ?- Continue IV vancomycin and cefepime as per regimen; end date 4/20 per last ID note ?- Keep legs elevated ?- Per recent wound care recommendation: ?Daily dressing changes using a silver hydrofiber (Aquacel Ag+ Advantage covered with ABD pads for antimicrobial donation and absorption, and secure with dry boot (Kerlix and ACE) over Unna's boot at this time and change daily.  ?-Pressure redistribution heel boots and pressure redistribution chair cushion for pressure injury prevention when OOB, as the patient sleeps in a recliner chair ? -- will change to OxyContin IR 10 mg BID and PRN oxycodone 10 mg Q6 PRN-- in preparation for discharge to rehab.  Discontinue IV Dilaudid ?  ?Physical deconditioning ?- significant deconditioning from last hospitalization ?- now amenable to SNF-- excepted bed at [facility] ?  ?ESRD on dialysis [facility]) ?- Nephrology following for continuation of dialysis ?- Schedule is MWF typically ?  ?Venous stasis of both lower extremities ?Keep leg elevated ?Continue Unna boots, Lasix ?Other management as above ?  ?Intractable pain ?- continue pain control ?  ?Esophageal varices (HCC) ?Related to cirrhosis.  No evidence of acute bleeding ?  ?Cirrhosis of liver ([place]) ?Not acutely decompensated ?  ?HFrEF (heart failure with reduced ejection fraction) ([place]) ?Most recent EF 40 to 45% ?Continue Lasix and carvedilol ?that's 99% on room air ?  ?AF (paroxysmal atrial fibrillation) ([place]) ?Continue carvedilol and apixaban ?  ?Chronic anticoagulation ?Continue apixaban for stroke prevention and A-fib as hemoglobin is stable and no evidence of active bleeding ?  ?Anemia of chronic disease ?Hemoglobin at baseline ?  ?  ?DVT prophylaxis:  ?  ?apixaban (ELIQUIS) tablet 5 mg  ?  ?Code Status:   Code Status: Full Code ?  ?Disposition Plan:  SNF  today ? ?  ? ?Diet recommendation:  ?Discharge Diet Orders (From admission, onward)  ? ?  Start     Ordered  ? 10/26/21 0000  Diet - low sodium heart healthy       ? 10/26/21 1159  ? ?  ?  ? ?  ? ?Renal diet ?DISCHARGE MEDICATION: ?Allergies as of 10/26/2021   ? ?   Reactions  ? Cyclobenzaprine Nausea Only  ? Amlodipine   ? Clonidine Other (See Comments)  ? Asymptomatic bradycardia to 38  ? Furosemide Other (See  Comments)  ? Caused renal failure  ? Tylenol [acetaminophen] Other (See Comments)  ? Reaction:  Pt states it bothers his liver  ? ?  ? ?  ?Medication List  ?  ? ?STOP taking these medications   ? ?oxymorphone 10 MG 12 hr tablet ?Commonly known as: OPANA ER ?  ? ?  ? ?TAKE these medications   ? ?albuterol 108 (90 Base) MCG/ACT inhaler ?Commonly known as: VENTOLIN HFA ?Inhale 2 puffs into the lungs every  6 (six) hours as needed for wheezing or shortness of breath. ?  ?apixaban 5 MG Tabs tablet ?Commonly known as: Eliquis ?Take 1 tablet (5 mg total) by mouth 2 (two) times daily. ?  ?atorvastatin 40 MG tablet ?Commonly known as: LIPITOR ?Take 1 tablet (40 mg total) by mouth at bedtime. ?  ?bumetanide 2 MG tablet ?Commonly known as: BUMEX ?Take 1 tablet (2 mg total) by mouth daily. ?  ?carvedilol 6.25 MG tablet ?Commonly known as: COREG ?Take 6.25 mg by mouth 2 (two) times daily. ?  ?ceFEPime  IVPB ?Commonly known as: MAXIPIME ?Inject 2 g into the vein every Monday, Wednesday, and Friday with hemodialysis for 7 days. To be given at HD ?Indication:  cellulitis ?Last Day of Therapy:  4/19 ?Labs - Once weekly:  CBC/D, CMP, and pre-dialysis vancomycin level. ?Fax weekly labs to [phone] ?  ?diclofenac Sodium 1 % Gel ?Commonly known as: VOLTAREN ?Apply 2 g topically 4 (four) times daily. ?  ?naloxone 4 MG/0.1ML Liqd nasal spray kit ?Commonly known as: NARCAN ?SMARTSIG:Both Nares ?  ?omeprazole 40 MG capsule ?Commonly known as: PRILOSEC ?Take 40 mg by mouth 2 (two) times daily. ?  ?ondansetron 8 MG tablet ?Commonly known as: ZOFRAN ?TAKE ONE-HALF TABLET BY MOUTH EVERY 8 HOURS AS NEEDED NAUSEA ?  ?oxyCODONE 10 mg 12 hr tablet ?Commonly known as: OXYCONTIN ?Take 1 tablet (10 mg total) by mouth every 12 (twelve) hours. ?What changed:  ?medication strength ?how much to take ?Another medication with the same name was removed. Continue taking this medication, and follow the directions you see here. ?  ?polyethylene glycol 17 g packet ?Commonly known as: MIRALAX / GLYCOLAX ?Take 17 g by mouth daily. ?  ?MiraLax 17 GM/SCOOP powder ?Generic drug: polyethylene glycol powder ?Take 17 g by mouth daily. ?  ?pregabalin 25 MG capsule ?Commonly known as: LYRICA ?Take 25 mg by mouth at bedtime. ?  ?tamsulosin 0.4 MG Caps capsule ?Commonly known as: FLOMAX ?Take 0.4 mg by mouth daily. ?  ?traMADol 50 MG tablet ?Commonly known as:  ULTRAM ?Take 1 tablet (50 mg total) by mouth every 6 (six) hours as needed for moderate pain. ?  ?vancomycin  IVPB ?Inject 1,000 mg into the vein every Monday, Wednesday, and Friday with hemodialysis for 7 days. To be given at HD ?Indication:  cellulitis ?Last Day of Therapy:  4/19 ?Labs - Once weekly:  CBC/D, CMP, and pre-dialysis vancomycin level. ?Fax weekly labs to [phone] ?  ? ?  ? ?  ?  ? ? ?  ?Discharge Care Instructions  ?(From admission, onward)  ?  ? ? ?  ? ?  Start     Ordered  ? 10/26/21 0000  Discharge wound care:       ?Comments: Wound care  Until discontinued      ?Comments: Bedside nurse; please change bilat leg dressings Q M/W/F as follows: Apply xeroform gauze to open wounds, then cover with ABD pads.  Apply kerlex in a spiral  fashion, beginning just behind toes, to below knees, then ace wrap in the same manner.  Moisten with NS each time to remove previous dressings. ? 10/25/21 0958 ?  ?Wound care  Until discontinued      ?Comments: Bedside nurse; please change bilat leg dressings Q M/W/F as follows: Apply xeroform gauze to open wounds, then cover with ABD pads.  Apply kerlex in a spiral fashion, beginning just behind toes, to below knees, then ace wrap in the same manner.  Moisten with NS each time to remove previous dressings. ? 10/25/21 0958  ? 10/26/21 1159  ? ?  ?  ? ?  ? ? Contact information for follow-up providers   ? ? [facility] Follow up.   ?Specialty: General Practice ?Contact information: ?[address] ?[place] [address] ?[phone] ? ? ?  ?  ? ? Wellington Hampshire, MD .   ?Specialty: Cardiology ?Contact information: ?[address] ?[address] ?[place] [address] ?[phone] ? ? ?  ?  ? ?  ?  ? ? Contact information for after-discharge care   ? ? Destination   ? ? [facility] .   ?Service: Skilled Nursing ?Contact information: ?[address] ?[place] ?[phone] ? ?  ?  ? ?  ?  ? ?  ?  ? ?   ? ?Discharge Exam: ?Filed Weights  ? 10/22/21 1911 10/25/21 1037 10/25/21 1500  ?Weight: 120 kg 120.9 kg 119.6 kg  ? ? ? ?Condition at discharge: fair ? ?The results of significant diagnostics from this hospitalization

## 2021-10-26 NOTE — TOC Progression Note (Signed)
Transition of Care (TOC) - Progression Note  ? ? ?Patient Details  ?Name: [name] ?MRN: [id number] ?Date of Birth: 1955-09-19 ? ?Transition of Care (TOC) CM/SW Contact  ?Conception Oms, RN ?Phone Number: ?10/26/2021, 9:25 AM ? ?Clinical Narrative:    ?Met with the patient to review Bed offers, He said his uncle is at [facility] and he chose that bed offer, His Dialysis is at [facility] in [facility],  ?I notified Tonya at [facility] and Estill Bamberg the [facility] Nurse of the bedchoice ? ? ?Expected Discharge Plan: [place] ?Barriers to Discharge: Continued Medical Work up ? ?Expected Discharge Plan and Services ?Expected Discharge Plan: [place] ?  ?  ?  ?Living arrangements for the past 2 months: [place] ?                ?  ?  ?  ?  ?  ?  ?  ?  ?  ?  ? ? ?Social Determinants of Health (SDOH) Interventions ?  ? ?Readmission Risk Interventions ? ?  10/24/2021  ?  2:19 PM 10/03/2021  ?  1:48 PM  ?Readmission Risk Prevention Plan  ?Transportation Screening Complete Complete  ?Medication Review Press photographer) Complete Complete  ?PCP or Specialist appointment within 3-5 days of discharge Complete Complete  ?[place] or Home Care Consult Complete Complete  ?SW Recovery Care/Counseling Consult Complete   ?Palliative Care Screening Not Applicable Not Applicable  ?Skilled Nursing Facility Complete Not Applicable  ? ? ?

## 2021-11-11 ENCOUNTER — Emergency Department: Payer: No Typology Code available for payment source

## 2021-11-11 ENCOUNTER — Encounter: Payer: Self-pay | Admitting: Emergency Medicine

## 2021-11-11 ENCOUNTER — Emergency Department
Admission: EM | Admit: 2021-11-11 | Discharge: 2021-11-11 | Disposition: A | Discharge status: Skilled Nursing Facility | Payer: No Typology Code available for payment source | Attending: Emergency Medicine | Admitting: Emergency Medicine

## 2021-11-11 ENCOUNTER — Other Ambulatory Visit: Payer: Self-pay

## 2021-11-11 DIAGNOSIS — M79604 Pain in right leg: Secondary | ICD-10-CM

## 2021-11-11 DIAGNOSIS — D631 Anemia in chronic kidney disease: Secondary | ICD-10-CM | POA: Diagnosis not present

## 2021-11-11 DIAGNOSIS — I5032 Chronic diastolic (congestive) heart failure: Secondary | ICD-10-CM | POA: Insufficient documentation

## 2021-11-11 DIAGNOSIS — I132 Hypertensive heart and chronic kidney disease with heart failure and with stage 5 chronic kidney disease, or end stage renal disease: Secondary | ICD-10-CM | POA: Diagnosis not present

## 2021-11-11 DIAGNOSIS — M79661 Pain in right lower leg: Secondary | ICD-10-CM | POA: Insufficient documentation

## 2021-11-11 DIAGNOSIS — Z7901 Long term (current) use of anticoagulants: Secondary | ICD-10-CM | POA: Insufficient documentation

## 2021-11-11 DIAGNOSIS — I4891 Unspecified atrial fibrillation: Secondary | ICD-10-CM | POA: Insufficient documentation

## 2021-11-11 DIAGNOSIS — M7989 Other specified soft tissue disorders: Secondary | ICD-10-CM | POA: Diagnosis not present

## 2021-11-11 DIAGNOSIS — N186 End stage renal disease: Secondary | ICD-10-CM | POA: Insufficient documentation

## 2021-11-11 DIAGNOSIS — I890 Lymphedema, not elsewhere classified: Secondary | ICD-10-CM

## 2021-11-11 DIAGNOSIS — I2510 Atherosclerotic heart disease of native coronary artery without angina pectoris: Secondary | ICD-10-CM | POA: Insufficient documentation

## 2021-11-11 DIAGNOSIS — Z992 Dependence on renal dialysis: Secondary | ICD-10-CM | POA: Insufficient documentation

## 2021-11-11 LAB — BASIC METABOLIC PANEL
Anion gap: 7 (ref 5–15)
BUN: 36 mg/dL — ABNORMAL HIGH (ref 8–23)
CO2: 29 mmol/L (ref 22–32)
Calcium: 8.3 mg/dL — ABNORMAL LOW (ref 8.9–10.3)
Chloride: 101 mmol/L (ref 98–111)
Creatinine, Ser: 5.01 mg/dL — ABNORMAL HIGH (ref 0.61–1.24)
GFR, Estimated: 12 mL/min — ABNORMAL LOW (ref >60)
Glucose, Bld: 91 mg/dL (ref 70–99)
Potassium: 4.3 mmol/L (ref 3.5–5.1)
Sodium: 137 mmol/L (ref 135–145)

## 2021-11-11 LAB — CBC
HCT: 26.1 % — ABNORMAL LOW (ref 39.0–52.0)
Hemoglobin: 8.2 g/dL — ABNORMAL LOW (ref 13.0–17.0)
MCH: 27.7 pg (ref 26.0–34.0)
MCHC: 31.4 g/dL (ref 30.0–36.0)
MCV: 88.2 fL (ref 80.0–100.0)
Platelets: 166 10*3/uL (ref 150–400)
RBC: 2.96 MIL/uL — ABNORMAL LOW (ref 4.22–5.81)
RDW: 16 % — ABNORMAL HIGH (ref 11.5–15.5)
WBC: 7.5 10*3/uL (ref 4.0–10.5)
nRBC: 0 % (ref 0.0–0.2)

## 2021-11-11 MED ORDER — OXYCODONE HCL 5 MG PO TABS
10.0000 mg | ORAL_TABLET | Freq: Once | ORAL | Status: AC
  Administered 2021-11-11: 10 mg via ORAL
  Filled 2021-11-11: qty 2

## 2021-11-11 MED ORDER — HYDROMORPHONE HCL 1 MG/ML IJ SOLN
1.0000 mg | Freq: Once | INTRAMUSCULAR | Status: AC
  Administered 2021-11-11: 1 mg via INTRAVENOUS
  Filled 2021-11-11: qty 1

## 2021-11-11 MED ORDER — HYDROMORPHONE HCL 1 MG/ML IJ SOLN
1.0000 mg | INTRAMUSCULAR | Status: DC | PRN
  Administered 2021-11-11: 1 mg via INTRAVENOUS
  Filled 2021-11-11: qty 1

## 2021-11-11 NOTE — ED Notes (Signed)
Pt cleaned of stool and urine, brief changed, pt placed in [address] in recliner to wait for transport. Pt given warm blanket and sandwich tray and crackers and soda. Pt tolerated po well. Pt sleeping at present.  ?

## 2021-11-11 NOTE — ED Notes (Signed)
Pt's brother took pt to facility.  ?

## 2021-11-11 NOTE — Discharge Instructions (Addendum)
You may change to taking your oxycodone every 8 hours as needed for significant lower extremity pain ?

## 2021-11-11 NOTE — ED Notes (Signed)
US in room 

## 2021-11-11 NOTE — ED Notes (Addendum)
Pt's legs dressed per Janett Billow at  [facility] orders, left leg, xeroform over wounds, abd pads, kerlex, ace wrap ?Right leg, abd pads over wounds, kerlex, ace wrap ?

## 2021-11-11 NOTE — ED Notes (Signed)
Pt sleeping in recliner, waiting for transport.  ?

## 2021-11-11 NOTE — ED Notes (Signed)
Pt asking for dilaudid, pt informed that MD discontinued IV pain meds.  ?

## 2021-11-11 NOTE — ED Notes (Signed)
Pt asking for pain meds, pt states he wants dilaudid, MD notified.  ?

## 2021-11-11 NOTE — ED Notes (Signed)
Pt states he wants his legs hanging down after this RN offered to elevate legs. Pt in recliner.  ?

## 2021-11-11 NOTE — ED Notes (Signed)
Pt taking of monitors, pt states that dilaudid is not working. Pt informed that pain meds are ordered every 2 hours as needed and he cannot have more dilaudid until 11 am. Pt sitting on edge of bed with legs hanging off side of bed. Pt reminded not to get up without assistance. Pt refusing to let nurse put legs up in bed and elevated. Pt states he is more comfortable with legs hanging off side of bed. Feet and legs too swollen and with multiple areas of skin breakdown to place non skid socks.  ?

## 2021-11-11 NOTE — ED Provider Notes (Addendum)
? ?[facility] ?Provider Note ? ? ? Event Date/Time  ? First MD Initiated Contact with Patient 11/11/21 0550   ?  (approximate) ? ? ?History  ? ?Leg Pain ? ? ?HPI ? ?[name] is a 66 y.o. male   with past medical history of end-stage renal disease on dialysis Monday Wednesday Friday, hypertension, A-fib on Eliquis, cirrhosis, heart failure with reduced EF chronic venous stasis and chronic bilateral extremity wounds with recent hospital admission for IV antibiotics who presents with right lower extremity pain.  Patient tells me that his pain in the right extremity started tonight.  Started after he was trying to elevate the leg.  He has chronic wounds which are being dressed he is currently at a rehab facility.  Has worsening pain with elevations as been keeping his legs down and sleeping in chair.  He denies shortness of breath or chest pain.  Has not missed any dialysis.  History is somewhat limited given patient's degree of distress. ? ?  ? ?Past Medical History:  ?Diagnosis Date  ? Depression   ? Diastolic dysfunction   ? a. 09/2019 Echo: EF 55-60%, no rwma, mod LVH, gr2 DD. Nl RV size/fxn. Mildly dil LA. Ao root 4.5cm.  ? Dilated aortic root ([place])   ? a. 09/2019 Echo: Ao root 4.5cm.  ? Elevated troponin level not due myocardial infarction   ? ESRD (end stage renal disease) ([place])   ? Hypertension   ? Nonobstructive Coronary Artery Disease   ? a. 09/2019 Cath: LM nl, LAD min irregs, D1 nl, D2 min irregs, LCX large, min irregs, RCA large, 20p, 30p/m.  ? PE (pulmonary embolism)   ? Renal insufficiency   ? ? ?Patient Active Problem List  ? Diagnosis Date Noted  ? Bilateral lower leg cellulitis 10/22/2021  ? Physical deconditioning 10/22/2021  ? Venous stasis of both lower extremities 10/22/2021  ? Cellulitis 10/18/2021  ? Multiple open wounds of left lower leg 10/02/2021  ? Multiple open wounds of lower leg 10/01/2021  ? Hyperkalemia 08/08/2021  ? Acute hypoxemic respiratory failure ([place])  08/08/2021  ? Lymphedema associated with obesity 08/07/2021  ? OSA on CPAP 08/07/2021  ? Cirrhosis of liver ([place]) 06/16/2021  ? Esophageal varices ([place]) 06/16/2021  ? Hyperphosphatemia 06/15/2021  ? Persistent atrial fibrillation ([place])   ? HFrEF (heart failure with reduced ejection fraction) ([place])   ? Chest pain 01/05/2021  ? Cough   ? CAD (coronary artery disease) 01/05/2020  ? Chronic anticoagulation 01/05/2020  ? Acute bronchitis 01/05/2020  ? AF (paroxysmal atrial fibrillation) ([place]) 01/05/2020  ? Intractable pain 10/24/2019  ? History of substance abuse ([place]) 10/24/2019  ? Elevated troponin 10/24/2019  ? Nonspecific chest pain 10/24/2019  ? Chronic diastolic CHF (congestive heart failure) ([place]) 09/28/2019  ? History of MI (myocardial infarction) 09/28/2019  ? Anemia of chronic kidney failure, stage 5 ([place]) 09/28/2019  ? History of pulmonary embolism 09/28/2019  ? Chronic midline low back pain   ? Acute respiratory failure with hypoxia ([place])   ? Acute diastolic CHF (congestive heart failure) ([place])   ? Anemia of chronic disease   ? Thrombocytopenia ([place])   ? Congestive heart failure ([place])   ? Fluid overload 06/21/2019  ? Volume overload 11/15/2018  ? ESRD (end stage renal disease) on dialysis ([place]) 11/14/2018  ? HCAP (healthcare-associated pneumonia) 08/27/2018  ? SOB (shortness of breath) 08/25/2018  ? Chest pain of uncertain etiology 93/81/0175  ? ESRD on dialysis ([place]) 11/08/2016  ?  Prostate cancer screening 11/08/2016  ? Acquired cyst of kidney 11/08/2016  ? Urinary urgency 11/08/2016  ? Acute on chronic renal failure ([place]) 09/23/2016  ? Hypertensive urgency 09/21/2016  ? Dysthymia 02/26/2016  ? Chronic pain 02/26/2016  ? Noncompliance with renal dialysis 02/26/2016  ? Opiate abuse, continuous ([place]) 02/03/2016  ? Substance induced mood disorder ([place]) 02/03/2016  ? Antisocial personality disorder ([place]) 02/03/2016  ? Left-sided weakness 01/18/2016  ? HEPATITIS C 06/28/2008  ? HLD (hyperlipidemia) 06/28/2008  ?  SUBSTANCE ABUSE, MULTIPLE 06/28/2008  ? Hypertension 06/27/2008  ? ? ? ?Physical Exam  ?Triage Vital Signs: ?ED Triage Vitals  ?Enc Vitals Group  ?   BP 11/11/21 0122 138/72  ?   Pulse Rate 11/11/21 0122 (!) 56  ?   Resp 11/11/21 0122 20  ?   Temp 11/11/21 0122 (!) 97.5 ?F (36.4 ?C)  ?   Temp Source 11/11/21 0122 Oral  ?   SpO2 11/11/21 0122 97 %  ?   Weight 11/11/21 0127 270 lb (122.5 kg)  ?   Height 11/11/21 0127 6' (1.829 m)  ?   Head Circumference --   ?   Peak Flow --   ?   Pain Score 11/11/21 0127 9  ?   Pain Loc --   ?   Pain Edu? --   ?   Excl. in [place]? --   ? ? ?Most recent vital signs: ?Vitals:  ? 11/11/21 0122  ?BP: 138/72  ?Pulse: (!) 56  ?Resp: 20  ?Temp: (!) 97.5 ?F (36.4 ?C)  ?SpO2: 97%  ? ? ? ?General: Awake, no distress.  ?CV:  Good peripheral perfusion.  ?Resp:  Normal effort.  ?Abd:  No distention.  ?Neuro:             Awake, Alert, Oriented x 3  ?Other:  Photos of bilateral extremities below, right lower extremity is less swollen compared to left he is able to range the knee and move his toes, there is a dopplerable biphasic DP and PT pulse and bilateral feet are warm compartments soft wounds that appear chronic on the right lower extremity ?Lower extremity is more swollen compared to right and more erythematous but less painful ? ? ? ? ? ? ? ? ? ? ?ED Results / Procedures / Treatments  ?Labs ?(all labs ordered are listed, but only abnormal results are displayed) ?Labs Reviewed  ?CBC - Abnormal; Notable for the following components:  ?    Result Value  ? RBC 2.96 (*)   ? Hemoglobin 8.2 (*)   ? HCT 26.1 (*)   ? RDW 16.0 (*)   ? All other components within normal limits  ?BASIC METABOLIC PANEL - Abnormal; Notable for the following components:  ? BUN 36 (*)   ? Creatinine, Ser 5.01 (*)   ? Calcium 8.3 (*)   ? GFR, Estimated 12 (*)   ? All other components within normal limits  ?PROCALCITONIN  ?BRAIN NATRIURETIC PEPTIDE  ? ? ? ?EKG ? ? ? ? ?RADIOLOGY ? ? ? ?PROCEDURES: ? ?Critical Care performed:  No ? ?Procedures ? ?The patient is on the cardiac monitor to evaluate for evidence of arrhythmia and/or significant heart rate changes. ? ? ?MEDICATIONS ORDERED IN ED: ?Medications  ?HYDROmorphone (DILAUDID) injection 1 mg (1 mg Intravenous Given 11/11/21 0658)  ?oxyCODONE (Oxy IR/ROXICODONE) immediate release tablet 10 mg (10 mg Oral Given 11/11/21 6468)  ? ? ? ?IMPRESSION / MDM / ASSESSMENT AND PLAN / ED COURSE  ?  I reviewed the triage vital signs and the nursing notes. ?             ?               ? ?Differential diagnosis includes, but is not limited to, chronic venous stasis, cellulitis, DVT, less likely acute limb ischemia ? ?Patient is a 66 year old male with chronic lower extremity venous stasis prior episodes of cellulitis and chronic wounds who is currently residing in rehab presents with right lower extremity pain specifically.  Started today when he tried to elevate the legs.  Patient is quite distressed on my exam yelling that he needs pain medication will not let us get him into the bed.  He has impressive but chronic appearing wounds on the right lower extremity which is less swollen compared to the left.  Unable to palpate pulses given the degree of edema but I do have dopplerable DP and PT signals.  Compartments are soft.  Overall I suspect that this is worsening of his chronic pain my suspicion for acute limb threatening process is low.  Will obtain a DVT study of bilateral lower extremities given the left leg is actually more swollen compared to the right.  Reviewed his labs which are overall reassuring he has no leukocytosis he is on dialysis creatinine is stable.  We will send Pro-Cal and BNP as well.  We will give IV Dilaudid. ? ?He is currently pending DVT study pain control and reassessment.  Signed out to oncoming provider. ? ?  ? ? ?FINAL CLINICAL IMPRESSION(S) / ED DIAGNOSES  ? ?Final diagnoses:  ?Pain of right lower extremity  ? ? ? ?Rx / DC Orders  ? ?ED Discharge Orders   ? ? None  ? ?   ? ? ? ?Note:  This document was prepared using Dragon voice recognition software and may include unintentional dictation errors. ?  ?Rada Hay, MD ?11/11/21 0700 ? ?  ?Rada Hay, MD ?05/04/2

## 2021-11-11 NOTE — ED Triage Notes (Signed)
Pt to ED via EMS from [facility] c/o right lower leg pain started tonight.  Denies injury, redness, or new swelling.  Bilateral legs are swollen at this time.  Dialysis patient MWF and went yesterday.  Denies SOB, fever, or chest pain. ?

## 2021-11-11 NOTE — ED Notes (Signed)
Pt with weeping wounds to RLE, inner and outer calf, LLE outer calf ?

## 2022-01-20 ENCOUNTER — Encounter: Payer: Self-pay | Admitting: Cardiovascular Disease

## 2022-01-20 ENCOUNTER — Ambulatory Visit: Payer: Medicare Other | Admitting: Cardiovascular Disease

## 2022-03-07 IMAGING — CR DG CHEST 2V
1 series · 3 of 3 positions shown · non-contrast
Comparison: 09/27/2019

CLINICAL DATA: Chest pain, began about 2 hours into dialysis
treatment, pain located at center of chest, pressure like pain,
hypertension, smoker

EXAM:
CHEST - 2 VIEW

[Series 1: dg chest 2 view · 0.14mm/px · 3 of 3 slices shown]
[im 1/3]
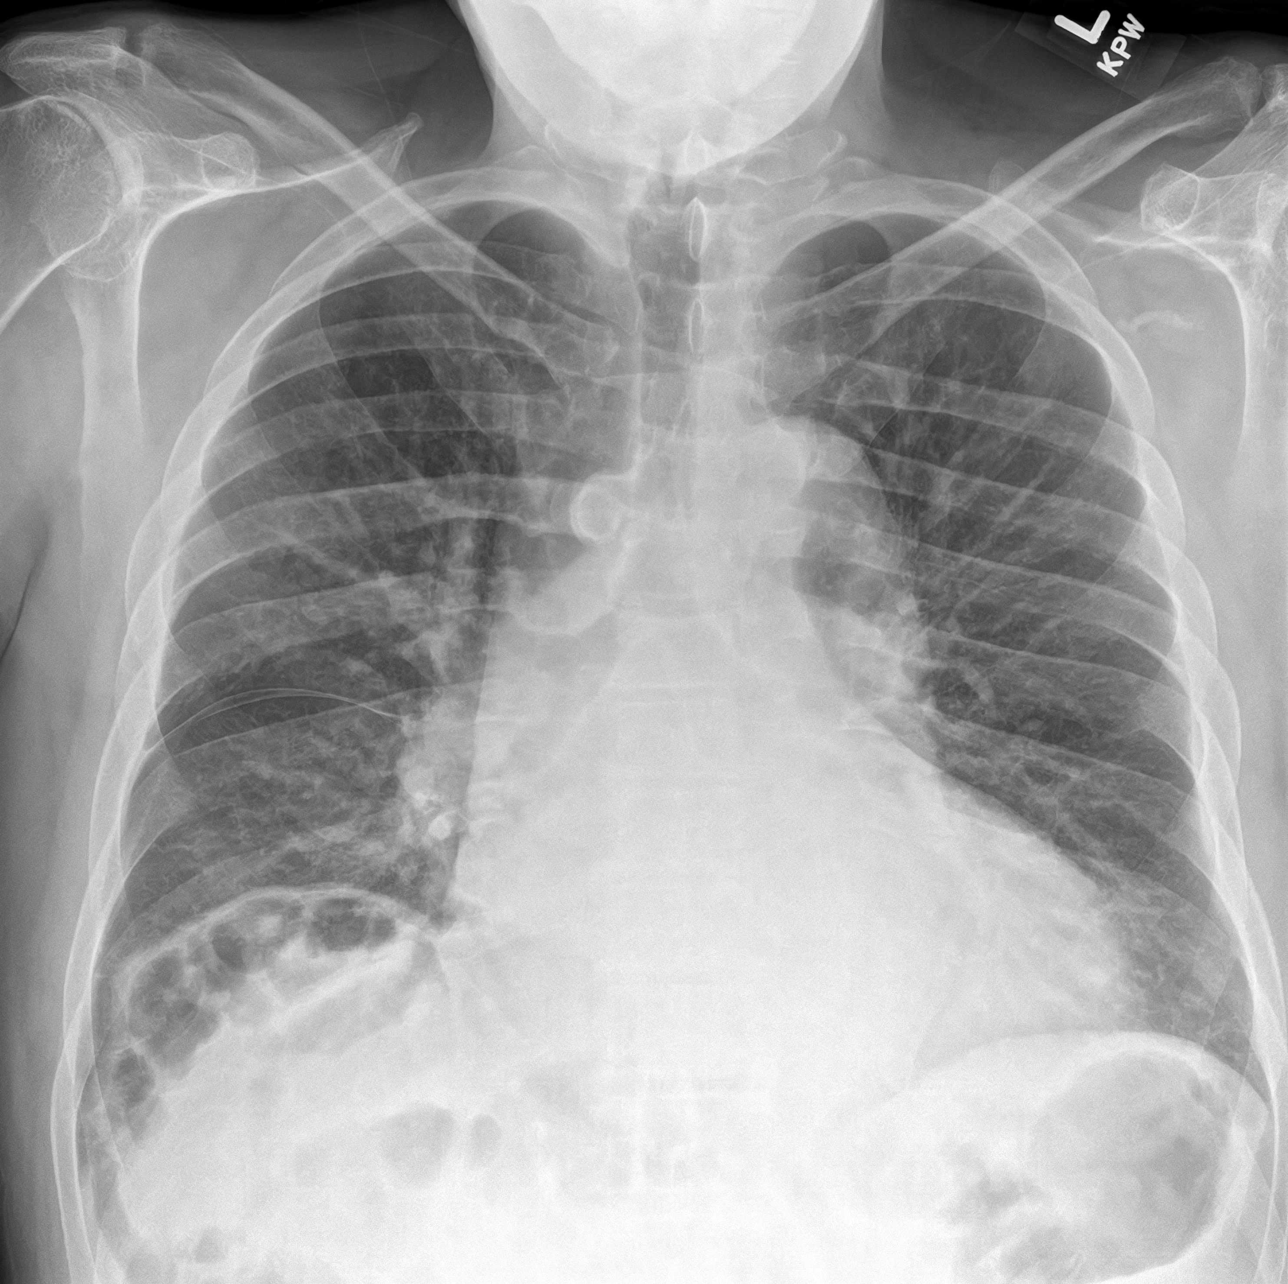
[im 2/3]
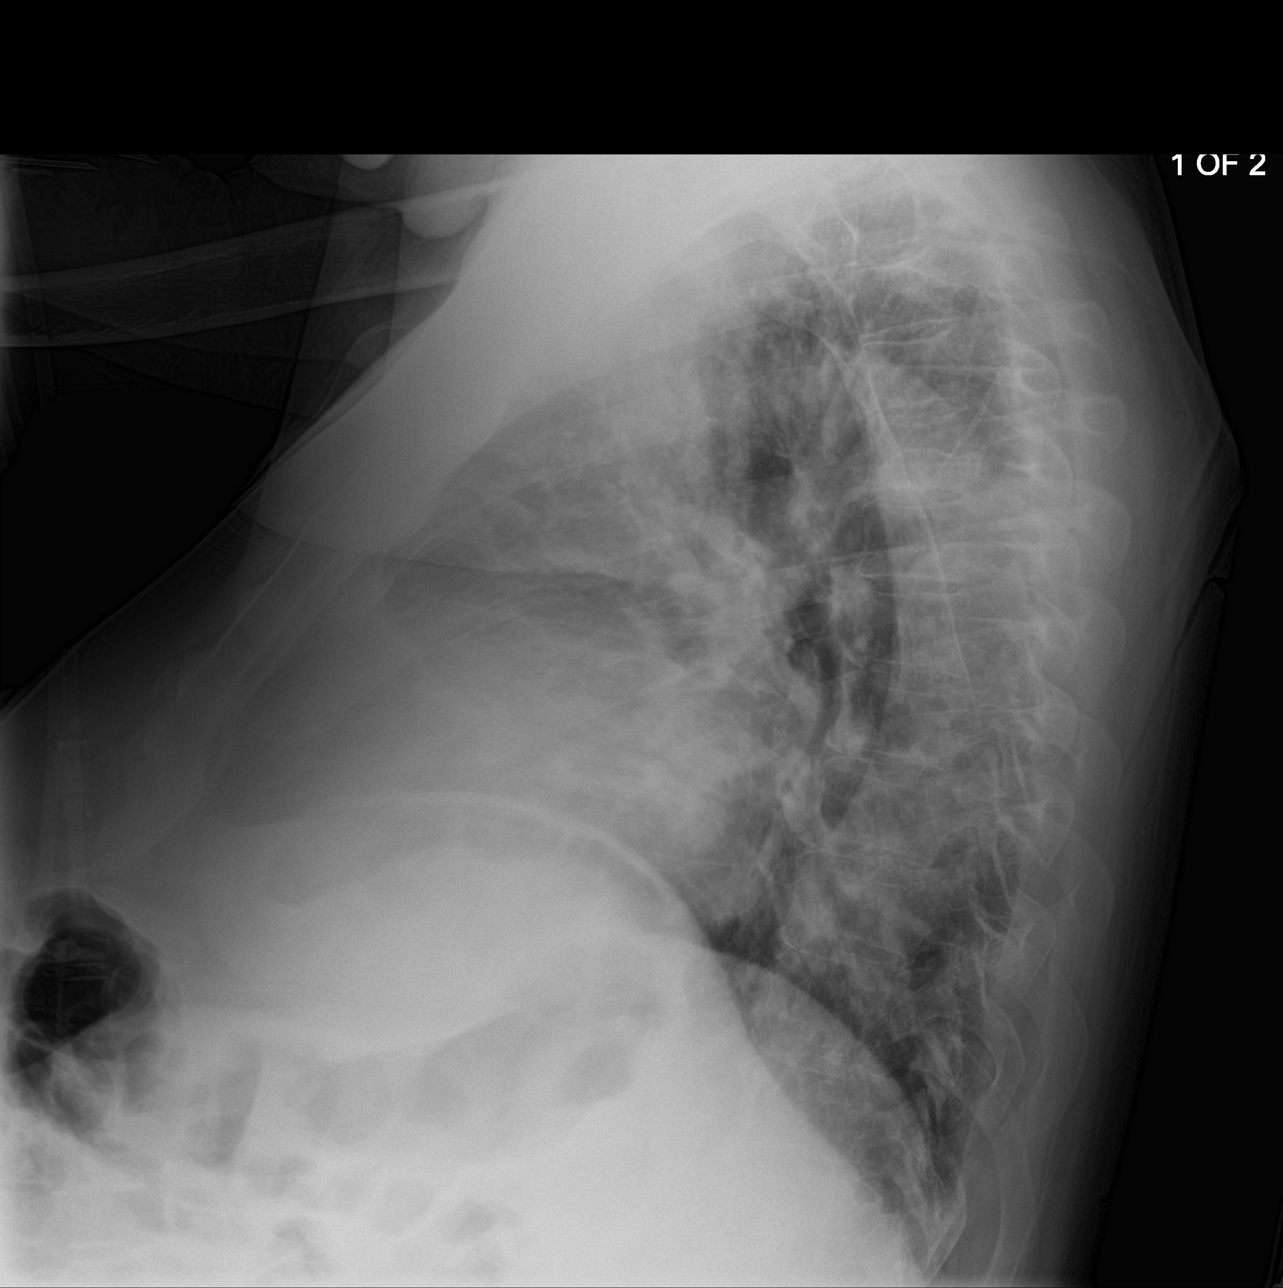
[im 3/3]
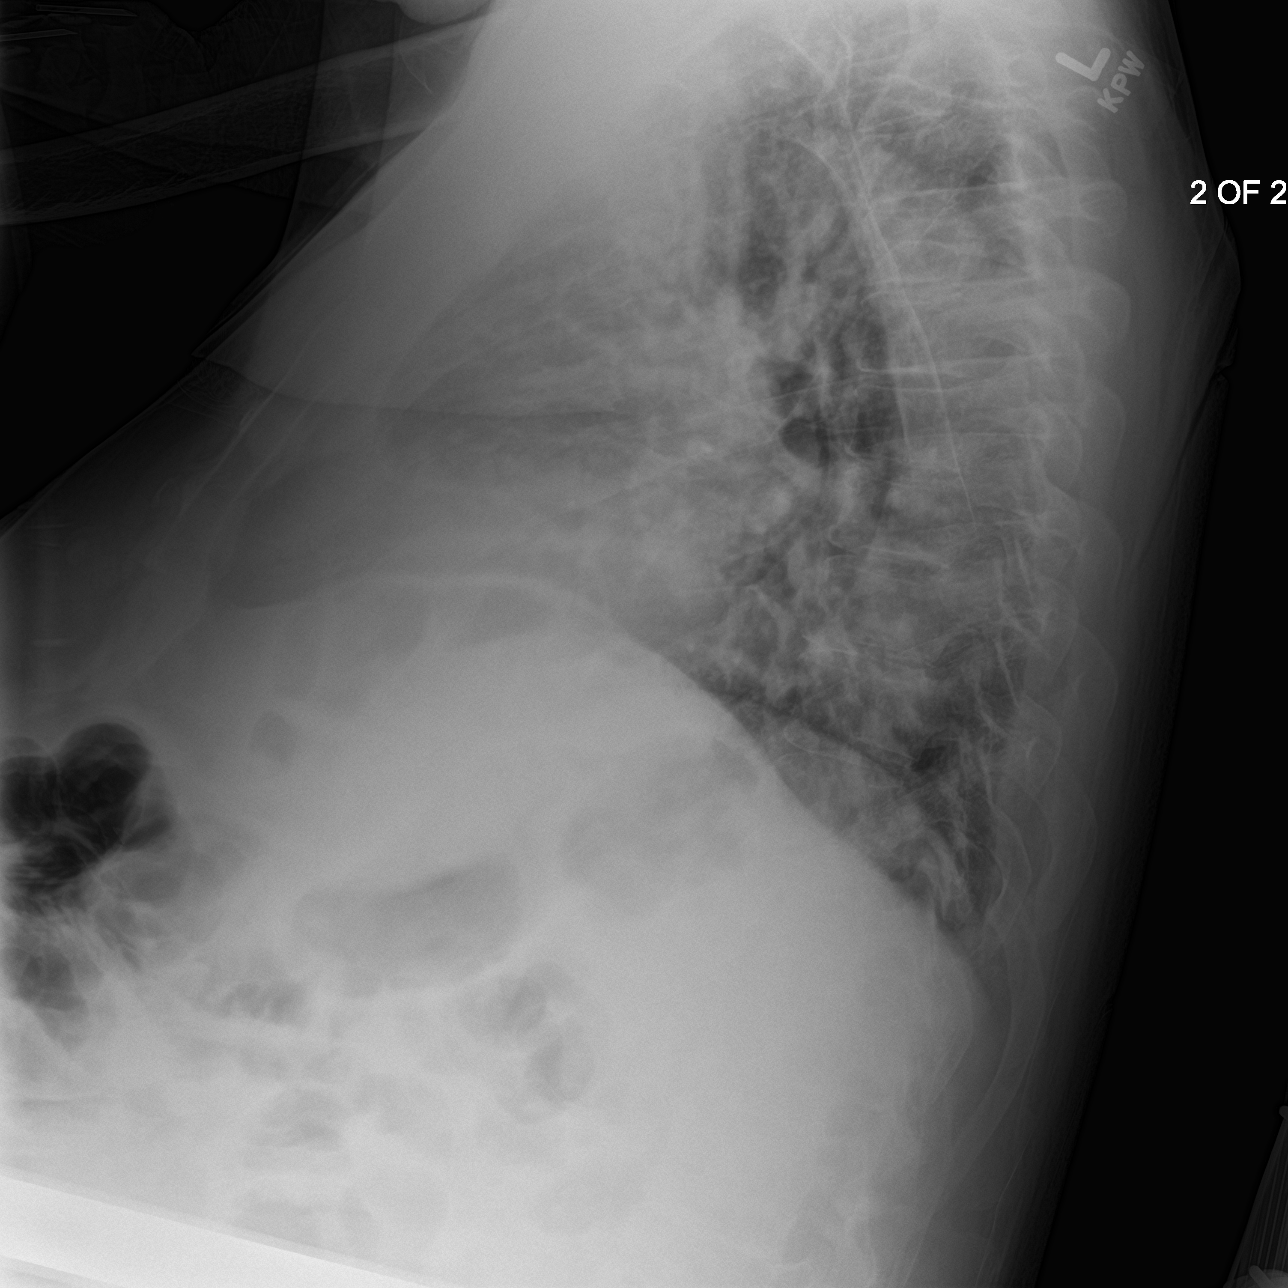

[3 of 3 positions shown; findings below may reference images not displayed]

FINDINGS: Enlargement of cardiac silhouette with pulmonary vascular
congestion.

Tortuosity of thoracic aorta.

Lungs clear.

No infiltrate, pleural effusion or pneumothorax.

Bowel interposition between liver and diaphragm.

No acute osseous findings.
IMPRESSION: Enlargement of cardiac silhouette with pulmonary vascular
congestion.

No definite acute infiltrate.

## 2022-03-16 ENCOUNTER — Emergency Department
Admission: EM | Admit: 2022-03-16 | Discharge: 2022-03-16 | Disposition: A | Discharge status: Home / Self Care | Payer: No Typology Code available for payment source | Attending: Emergency Medicine | Admitting: Emergency Medicine

## 2022-03-16 ENCOUNTER — Encounter: Payer: Self-pay | Admitting: Emergency Medicine

## 2022-03-16 ENCOUNTER — Emergency Department: Payer: No Typology Code available for payment source

## 2022-03-16 DIAGNOSIS — Z992 Dependence on renal dialysis: Secondary | ICD-10-CM | POA: Diagnosis not present

## 2022-03-16 DIAGNOSIS — I2510 Atherosclerotic heart disease of native coronary artery without angina pectoris: Secondary | ICD-10-CM | POA: Insufficient documentation

## 2022-03-16 DIAGNOSIS — I132 Hypertensive heart and chronic kidney disease with heart failure and with stage 5 chronic kidney disease, or end stage renal disease: Secondary | ICD-10-CM | POA: Diagnosis not present

## 2022-03-16 DIAGNOSIS — N186 End stage renal disease: Secondary | ICD-10-CM | POA: Diagnosis not present

## 2022-03-16 DIAGNOSIS — J810 Acute pulmonary edema: Secondary | ICD-10-CM | POA: Diagnosis not present

## 2022-03-16 DIAGNOSIS — D631 Anemia in chronic kidney disease: Secondary | ICD-10-CM | POA: Diagnosis not present

## 2022-03-16 DIAGNOSIS — I5032 Chronic diastolic (congestive) heart failure: Secondary | ICD-10-CM | POA: Diagnosis not present

## 2022-03-16 DIAGNOSIS — R0602 Shortness of breath: Secondary | ICD-10-CM | POA: Diagnosis present

## 2022-03-16 LAB — HEPATITIS B SURFACE ANTIGEN: Hepatitis B Surface Ag: NONREACTIVE

## 2022-03-16 LAB — BASIC METABOLIC PANEL
Anion gap: 13 (ref 5–15)
BUN: 70 mg/dL — ABNORMAL HIGH (ref 8–23)
CO2: 26 mmol/L (ref 22–32)
Calcium: 8.4 mg/dL — ABNORMAL LOW (ref 8.9–10.3)
Chloride: 101 mmol/L (ref 98–111)
Creatinine, Ser: 8.33 mg/dL — ABNORMAL HIGH (ref 0.61–1.24)
GFR, Estimated: 7 mL/min — ABNORMAL LOW (ref >60)
Glucose, Bld: 115 mg/dL — ABNORMAL HIGH (ref 70–99)
Potassium: 4.5 mmol/L (ref 3.5–5.1)
Sodium: 140 mmol/L (ref 135–145)

## 2022-03-16 LAB — HEPATITIS B CORE ANTIBODY, TOTAL: Hep B Core Total Ab: REACTIVE — AB

## 2022-03-16 LAB — CBC
HCT: 26.1 % — ABNORMAL LOW (ref 39.0–52.0)
Hemoglobin: 8.5 g/dL — ABNORMAL LOW (ref 13.0–17.0)
MCH: 27.5 pg (ref 26.0–34.0)
MCHC: 32.6 g/dL (ref 30.0–36.0)
MCV: 84.5 fL (ref 80.0–100.0)
Platelets: 92 10*3/uL — ABNORMAL LOW (ref 150–400)
RBC: 3.09 MIL/uL — ABNORMAL LOW (ref 4.22–5.81)
RDW: 16.3 % — ABNORMAL HIGH (ref 11.5–15.5)
WBC: 5.2 10*3/uL (ref 4.0–10.5)
nRBC: 0 % (ref 0.0–0.2)

## 2022-03-16 LAB — HEPATITIS C ANTIBODY: HCV Ab: REACTIVE — AB

## 2022-03-16 LAB — HEPATITIS B SURFACE ANTIBODY,QUALITATIVE: Hep B S Ab: NONREACTIVE

## 2022-03-16 MED ORDER — ALTEPLASE 2 MG IJ SOLR: 2.0000 mg | Freq: Once | INTRAMUSCULAR | Status: DC | PRN

## 2022-03-16 MED ORDER — PENTAFLUOROPROP-TETRAFLUOROETH EX AERO: 1.0000 | INHALATION_SPRAY | CUTANEOUS | Status: DC | PRN

## 2022-03-16 MED ORDER — ANTICOAGULANT SODIUM CITRATE 4% (200MG/5ML) IV SOLN
5.0000 mL | Status: DC | PRN
Start: 2022-03-16 — End: 2022-03-17

## 2022-03-16 MED ORDER — HEPARIN SODIUM (PORCINE) 1000 UNIT/ML DIALYSIS
1000.0000 [IU] | INTRAMUSCULAR | Status: DC | PRN
Start: 2022-03-16 — End: 2022-03-17

## 2022-03-16 MED ORDER — ACETAMINOPHEN 325 MG PO TABS
650.0000 mg | ORAL_TABLET | Freq: Once | ORAL | Status: AC
Start: 2022-03-16 — End: 2022-03-16
  Administered 2022-03-16: 650 mg via ORAL

## 2022-03-16 MED ORDER — ACETAMINOPHEN 325 MG PO TABS
ORAL_TABLET | ORAL | Status: AC
  Filled 2022-03-16: qty 2

## 2022-03-16 MED ORDER — LIDOCAINE HCL (PF) 1 % IJ SOLN
5.0000 mL | INTRAMUSCULAR | Status: DC | PRN
Start: 2022-03-16 — End: 2022-03-17

## 2022-03-16 MED ORDER — CHLORHEXIDINE GLUCONATE CLOTH 2 % EX PADS
6.0000 | MEDICATED_PAD | Freq: Every day | CUTANEOUS | Status: DC
  Filled 2022-03-16: qty 6

## 2022-03-16 MED ORDER — LIDOCAINE-PRILOCAINE 2.5-2.5 % EX CREA: 1.0000 | TOPICAL_CREAM | CUTANEOUS | Status: DC | PRN

## 2022-03-16 NOTE — ED Triage Notes (Signed)
FIRST RN: Pt from home via [facility] with reports of being discharged from [facility] Friday and after leaving twisted his rt ankle and hasn't had HD since Friday.

## 2022-03-16 NOTE — ED Triage Notes (Signed)
Patient to ED c/o SOB due to not having dialysis since last Friday. Patient states he did not go due to spraining right ankle and being unable to walk. Discharged from rehab on Friday.

## 2022-03-16 NOTE — Discharge Instructions (Signed)
You had 2 hours of dialysis today.  Is very important that you go to dialysis as scheduled.

## 2022-03-16 NOTE — ED Notes (Signed)
Pt Dc to home via ambulance. Dc instructions reviewed with all questions answered. Iv removed, cath intact, pressure dressing applied, no bleeding noted at site. Pt transferred over to EMS stretcher with verbal report given to crew.

## 2022-03-16 NOTE — Progress Notes (Signed)
Hd tx of 2hr completed per pt early termination request. Dr Holley Raring notified. 54.2L total vol processed. No complications. Report given to [employer], rn. Total uf removed: 2031ml Post hd v/s: 97.8 140/84(99) 74 18 96%

## 2022-03-16 NOTE — ED Provider Notes (Signed)
[facility] Provider Note    Event Date/Time   First MD Initiated Contact with Patient 03/16/22 1516     (approximate)   History   Shortness of Breath   HPI  [name] is a 66 y.o. male  with pmh HTN, HFpEF, Cirrhosis 2/2 to Hepatitis C (treated), ESRD on HD, and chronic lower extremity lymphedema c/b multiple superimposed infections  who presents with shortness of breath.  Patient recently had prolonged admission for lower extremity ulcers status postdebridement and prolonged course of IV antibiotics.  He was at a nursing facility and was discharged to home on Friday.  His last dialysis session was at the facility on Friday.  He notes that he twisted his right ankle after leaving rehab and this made it more difficult for him to ambulate.  Typically uses a walker has had more difficulty over the last several days so he missed dialysis on Monday and today.  He is now feeling more short of breath denies any chest pain nausea vomiting cough fevers chills abdominal pain.  Overall his lower extremity wounds are at baseline.  He lives with family.  He says that he will be able to get to dialysis on Friday.    Past Medical History:  Diagnosis Date   Depression    Diastolic dysfunction    a. 09/2019 Echo: EF 55-60%, no rwma, mod LVH, gr2 DD. Nl RV size/fxn. Mildly dil LA. Ao root 4.5cm.   Dilated aortic root ([place])    a. 09/2019 Echo: Ao root 4.5cm.   Elevated troponin level not due myocardial infarction    ESRD (end stage renal disease) ([place])    Hypertension    Nonobstructive Coronary Artery Disease    a. 09/2019 Cath: LM nl, LAD min irregs, D1 nl, D2 min irregs, LCX large, min irregs, RCA large, 20p, 30p/m.   PE (pulmonary embolism)    Renal insufficiency     Patient Active Problem List   Diagnosis Date Noted   Bilateral lower leg cellulitis 10/22/2021   Physical deconditioning 10/22/2021   Venous stasis of both lower extremities 10/22/2021   Cellulitis  10/18/2021   Multiple open wounds of left lower leg 10/02/2021   Multiple open wounds of lower leg 10/01/2021   Hyperkalemia 08/08/2021   Acute hypoxemic respiratory failure ([place]) 08/08/2021   Lymphedema associated with obesity 08/07/2021   OSA on CPAP 08/07/2021   Cirrhosis of liver ([place]) 06/16/2021   Esophageal varices ([place]) 06/16/2021   Hyperphosphatemia 06/15/2021   Persistent atrial fibrillation (HCC)    HFrEF (heart failure with reduced ejection fraction) ([place])    Chest pain 01/05/2021   Cough    CAD (coronary artery disease) 01/05/2020   Chronic anticoagulation 01/05/2020   Acute bronchitis 01/05/2020   AF (paroxysmal atrial fibrillation) ([place]) 01/05/2020   Intractable pain 10/24/2019   History of substance abuse ([place]) 10/24/2019   Elevated troponin 10/24/2019   Nonspecific chest pain 10/24/2019   Chronic diastolic CHF (congestive heart failure) ([place]) 09/28/2019   History of MI (myocardial infarction) 09/28/2019   Anemia of chronic kidney failure, stage 5 ([place]) 09/28/2019   History of pulmonary embolism 09/28/2019   Chronic midline low back pain    Acute respiratory failure with hypoxia (HCC)    Acute diastolic CHF (congestive heart failure) (HCC)    Anemia of chronic disease    Thrombocytopenia (HCC)    Congestive heart failure (HCC)    Fluid overload 06/21/2019   Volume overload 11/15/2018  ESRD (end stage renal disease) on dialysis ([place]) 11/14/2018   HCAP (healthcare-associated pneumonia) 08/27/2018   SOB (shortness of breath) 08/25/2018   Chest pain of uncertain etiology 84/16/6063   ESRD on dialysis ([place]) 11/08/2016   Prostate cancer screening 11/08/2016   Acquired cyst of kidney 11/08/2016   Urinary urgency 11/08/2016   Acute on chronic renal failure ([place]) 09/23/2016   Hypertensive urgency 09/21/2016   Dysthymia 02/26/2016   Chronic pain 02/26/2016   Noncompliance with renal dialysis 02/26/2016   Opiate abuse, continuous ([place]) 02/03/2016   Substance  induced mood disorder ([place]) 02/03/2016   Antisocial personality disorder ([place]) 02/03/2016   Left-sided weakness 01/18/2016   HEPATITIS C 06/28/2008   HLD (hyperlipidemia) 06/28/2008   SUBSTANCE ABUSE, MULTIPLE 06/28/2008   Hypertension 06/27/2008     Physical Exam  Triage Vital Signs: ED Triage Vitals  Enc Vitals Group     BP 03/16/22 1216 127/75     Pulse Rate 03/16/22 1216 (!) 49     Resp 03/16/22 1216 18     Temp 03/16/22 1216 98.1 F (36.7 C)     Temp Source 03/16/22 1216 Oral     SpO2 03/16/22 1216 93 %     Weight 03/16/22 1217 250 lb (113.4 kg)     Height 03/16/22 1217 6' (1.829 m)     Head Circumference --      Peak Flow --      Pain Score 03/16/22 1217 8     Pain Loc --      Pain Edu? --      Excl. in [place]? --     Most recent vital signs: Vitals:   03/16/22 1900 03/16/22 1930  BP: (!) 151/91 (!) 149/80  Pulse: 70 77  Resp: 16 14  Temp:    SpO2: (P) 99% 93%     General: Awake, no distress.  CV:  Good peripheral perfusion.  Right lower extremity with chronic venous stasis changes no obvious open wound or discharge no purulence, mild tenderness of bilateral malleoli no significant swelling, left lower extremity is more swollen compared to right with chronic venous stasis changes again Resp:  Normal effort.  Patient is not in respiratory distress does have crackles at the bases bilaterally Abd:  No distention.  Neuro:             Awake, Alert, Oriented x 3  Other:     ED Results / Procedures / Treatments  Labs (all labs ordered are listed, but only abnormal results are displayed) Labs Reviewed  BASIC METABOLIC PANEL - Abnormal; Notable for the following components:      Result Value   Glucose, Bld 115 (*)    BUN 70 (*)    Creatinine, Ser 8.33 (*)    Calcium 8.4 (*)    GFR, Estimated 7 (*)    All other components within normal limits  CBC - Abnormal; Notable for the following components:   RBC 3.09 (*)    Hemoglobin 8.5 (*)    HCT 26.1 (*)    RDW  16.3 (*)    Platelets 92 (*)    All other components within normal limits  HEPATITIS B SURFACE ANTIGEN  HEPATITIS B SURFACE ANTIBODY,QUALITATIVE  HEPATITIS B SURFACE ANTIBODY, QUANTITATIVE  HEPATITIS B CORE ANTIBODY, TOTAL  HEPATITIS C ANTIBODY     EKG  EKG shows atrial fibrillation with T wave inversions in 1 and aVL similar to prior, left axis deviation   RADIOLOGY Chest x-ray interpreted by myself  shows mild pulmonary edema   PROCEDURES:  Critical Care performed: No  Procedures     MEDICATIONS ORDERED IN ED: Medications  Chlorhexidine Gluconate Cloth 2 % PADS 6 each (has no administration in time range)  pentafluoroprop-tetrafluoroeth (GEBAUERS) aerosol 1 Application (has no administration in time range)  lidocaine (PF) (XYLOCAINE) 1 % injection 5 mL (has no administration in time range)  lidocaine-prilocaine (EMLA) cream 1 Application (has no administration in time range)  heparin injection 1,000 Units (has no administration in time range)  anticoagulant sodium citrate solution 5 mL (has no administration in time range)  alteplase (CATHFLO ACTIVASE) injection 2 mg (has no administration in time range)  acetaminophen (TYLENOL) 325 MG tablet (has no administration in time range)  acetaminophen (TYLENOL) tablet 650 mg (650 mg Oral Given 03/16/22 1905)     IMPRESSION / MDM / ASSESSMENT AND PLAN / ED COURSE  I reviewed the triage vital signs and the nursing notes.                              Patient's presentation is most consistent with acute presentation with potential threat to life or bodily function.  Differential diagnosis includes, but is not limited to, pulmonary edema, volume overload, hyperkalemia, pleural effusion, pneumonia  The patient is a 66 year old male with history of end-stage renal disease chronic lower extremity wounds who presents with shortness of breath after missing dialysis.  He was discharged from rehab facility on Friday and that was his  last dialysis session.  Was unable to get dialysis on Monday Wednesday because difficulty ambulating.  He at baseline has some difficulty secondary to his chronic illnesses and chronic lower extremity wounds.  Tells me that he twisted the right ankle several days ago and it has been more difficult then since then.  He is still however able to ambulate.  On exam he has some asymmetric edema with left lower extremity greater than right but right lower extremity is also wrapped in compression dressing.  His wounds overall look pretty good there is no obvious drainage purulence does not look infected.  Right ankle is mildly tender stable obtain x-ray to rule out occult fracture.  Chest x-ray does show pulmonary edema and he does have crackles at the bases however is not requiring oxygen at this time.  BUN mildly elevated at 70, potassium is normal.  Discussed with Dr. Holley Raring as I think patient would benefit from dialysis since he is not due for dialysis again until Friday.  Dr. Holley Raring is planning to dialyze the patient.  Afterward I think he will be appropriate for discharge.      FINAL CLINICAL IMPRESSION(S) / ED DIAGNOSES   Final diagnoses:  Acute pulmonary edema ([place])     Rx / DC Orders   ED Discharge Orders     None        Note:  This document was prepared using Dragon voice recognition software and may include unintentional dictation errors.   Rada Hay, MD 03/16/22 Despina Pole

## 2022-03-18 LAB — HEPATITIS B SURFACE ANTIBODY, QUANTITATIVE: Hep B S AB Quant (Post): 6.8 m[IU]/mL — ABNORMAL LOW (ref >9.9)

## 2022-07-04 ENCOUNTER — Other Ambulatory Visit: Payer: Self-pay

## 2022-07-04 DIAGNOSIS — Z992 Dependence on renal dialysis: Secondary | ICD-10-CM | POA: Insufficient documentation

## 2022-07-04 DIAGNOSIS — N186 End stage renal disease: Secondary | ICD-10-CM | POA: Insufficient documentation

## 2022-07-04 DIAGNOSIS — J810 Acute pulmonary edema: Secondary | ICD-10-CM | POA: Insufficient documentation

## 2022-07-04 DIAGNOSIS — Z20822 Contact with and (suspected) exposure to covid-19: Secondary | ICD-10-CM | POA: Diagnosis not present

## 2022-07-04 DIAGNOSIS — R079 Chest pain, unspecified: Secondary | ICD-10-CM | POA: Diagnosis present

## 2022-07-04 NOTE — ED Triage Notes (Signed)
Pt presents to ED via [facility] c/o shortness of breath and chest pain that started yesterday. Pt missed dialysis on Friday and Sunday. Pt states he feels bloated and "fluid all over his chest". Pt in NAD at this time.

## 2022-07-04 NOTE — ED Notes (Signed)
Per ems pt with shob and cp since yesterday. Vitals per ems stable, normal EKG per pt with only a-fib. Pt gets mwf dialysis, has not been to dialysis in over a week per ems.

## 2022-07-05 ENCOUNTER — Emergency Department: Payer: Medicare Other

## 2022-07-05 ENCOUNTER — Emergency Department
Admission: EM | Admit: 2022-07-05 | Discharge: 2022-07-05 | Disposition: A | Discharge status: Home / Self Care | Payer: Medicare Other | Attending: Emergency Medicine | Admitting: Emergency Medicine

## 2022-07-05 DIAGNOSIS — R079 Chest pain, unspecified: Secondary | ICD-10-CM

## 2022-07-05 DIAGNOSIS — J810 Acute pulmonary edema: Secondary | ICD-10-CM

## 2022-07-05 DIAGNOSIS — Z91158 Patient's noncompliance with renal dialysis for other reason: Secondary | ICD-10-CM

## 2022-07-05 DIAGNOSIS — R0602 Shortness of breath: Secondary | ICD-10-CM

## 2022-07-05 LAB — TROPONIN I (HIGH SENSITIVITY)
Troponin I (High Sensitivity): 60 ng/L — ABNORMAL HIGH (ref <18)
Troponin I (High Sensitivity): 61 ng/L — ABNORMAL HIGH (ref <18)
Troponin I (High Sensitivity): 61 ng/L — ABNORMAL HIGH (ref <18)

## 2022-07-05 LAB — RENAL FUNCTION PANEL
Albumin: 3.2 g/dL — ABNORMAL LOW (ref 3.5–5.0)
Anion gap: 16 — ABNORMAL HIGH (ref 5–15)
BUN: 53 mg/dL — ABNORMAL HIGH (ref 8–23)
CO2: 21 mmol/L — ABNORMAL LOW (ref 22–32)
Calcium: 8 mg/dL — ABNORMAL LOW (ref 8.9–10.3)
Chloride: 102 mmol/L (ref 98–111)
Creatinine, Ser: 9.93 mg/dL — ABNORMAL HIGH (ref 0.61–1.24)
GFR, Estimated: 5 mL/min — ABNORMAL LOW (ref >60)
Glucose, Bld: 127 mg/dL — ABNORMAL HIGH (ref 70–99)
Phosphorus: 9.8 mg/dL — ABNORMAL HIGH (ref 2.5–4.6)
Potassium: 4.7 mmol/L (ref 3.5–5.1)
Sodium: 139 mmol/L (ref 135–145)

## 2022-07-05 LAB — BASIC METABOLIC PANEL
Anion gap: 16 — ABNORMAL HIGH (ref 5–15)
BUN: 50 mg/dL — ABNORMAL HIGH (ref 8–23)
CO2: 24 mmol/L (ref 22–32)
Calcium: 8 mg/dL — ABNORMAL LOW (ref 8.9–10.3)
Chloride: 100 mmol/L (ref 98–111)
Creatinine, Ser: 9.76 mg/dL — ABNORMAL HIGH (ref 0.61–1.24)
GFR, Estimated: 5 mL/min — ABNORMAL LOW (ref >60)
Glucose, Bld: 95 mg/dL (ref 70–99)
Potassium: 4.4 mmol/L (ref 3.5–5.1)
Sodium: 140 mmol/L (ref 135–145)

## 2022-07-05 LAB — CBC
HCT: 29.2 % — ABNORMAL LOW (ref 39.0–52.0)
HCT: 30.3 % — ABNORMAL LOW (ref 39.0–52.0)
Hemoglobin: 9.7 g/dL — ABNORMAL LOW (ref 13.0–17.0)
Hemoglobin: 9.7 g/dL — ABNORMAL LOW (ref 13.0–17.0)
MCH: 28.4 pg (ref 26.0–34.0)
MCH: 28.6 pg (ref 26.0–34.0)
MCHC: 32 g/dL (ref 30.0–36.0)
MCHC: 33.2 g/dL (ref 30.0–36.0)
MCV: 86.1 fL (ref 80.0–100.0)
MCV: 88.9 fL (ref 80.0–100.0)
Platelets: 117 10*3/uL — ABNORMAL LOW (ref 150–400)
Platelets: 120 10*3/uL — ABNORMAL LOW (ref 150–400)
RBC: 3.39 MIL/uL — ABNORMAL LOW (ref 4.22–5.81)
RBC: 3.41 MIL/uL — ABNORMAL LOW (ref 4.22–5.81)
RDW: 17.4 % — ABNORMAL HIGH (ref 11.5–15.5)
RDW: 17.8 % — ABNORMAL HIGH (ref 11.5–15.5)
WBC: 4.1 10*3/uL (ref 4.0–10.5)
WBC: 4.2 10*3/uL (ref 4.0–10.5)
nRBC: 0 % (ref 0.0–0.2)
nRBC: 0.5 % — ABNORMAL HIGH (ref 0.0–0.2)

## 2022-07-05 LAB — RESP PANEL BY RT-PCR (RSV, FLU A&B, COVID)  RVPGX2
Influenza A by PCR: NEGATIVE
Influenza B by PCR: NEGATIVE
Resp Syncytial Virus by PCR: NEGATIVE
SARS Coronavirus 2 by RT PCR: NEGATIVE

## 2022-07-05 LAB — HEPATITIS B SURFACE ANTIGEN: Hepatitis B Surface Ag: NONREACTIVE

## 2022-07-05 MED ORDER — ALTEPLASE 2 MG IJ SOLR: 2.0000 mg | Freq: Once | INTRAMUSCULAR | Status: DC | PRN

## 2022-07-05 MED ORDER — MORPHINE SULFATE (PF) 2 MG/ML IV SOLN
INTRAVENOUS | Status: AC
  Administered 2022-07-05: 1 mg via INTRAVENOUS
  Filled 2022-07-05: qty 1

## 2022-07-05 MED ORDER — HEPARIN SODIUM (PORCINE) 1000 UNIT/ML DIALYSIS: 1000.0000 [IU] | INTRAMUSCULAR | Status: DC | PRN

## 2022-07-05 MED ORDER — LIDOCAINE-PRILOCAINE 2.5-2.5 % EX CREA: 1.0000 | TOPICAL_CREAM | CUTANEOUS | Status: DC | PRN

## 2022-07-05 MED ORDER — ANTICOAGULANT SODIUM CITRATE 4% (200MG/5ML) IV SOLN: 5.0000 mL | Status: DC | PRN

## 2022-07-05 MED ORDER — MORPHINE SULFATE (PF) 2 MG/ML IV SOLN: 1.0000 mg | Freq: Once | INTRAVENOUS | Status: AC

## 2022-07-05 MED ORDER — PENTAFLUOROPROP-TETRAFLUOROETH EX AERO: 1.0000 | INHALATION_SPRAY | CUTANEOUS | Status: DC | PRN

## 2022-07-05 MED ORDER — OXYCODONE HCL ER 10 MG PO T12A
10.0000 mg | EXTENDED_RELEASE_TABLET | Freq: Two times a day (BID) | ORAL | Status: DC
  Administered 2022-07-05: 10 mg via ORAL
  Filled 2022-07-05: qty 1

## 2022-07-05 MED ORDER — CHLORHEXIDINE GLUCONATE CLOTH 2 % EX PADS
6.0000 | MEDICATED_PAD | Freq: Every day | CUTANEOUS | Status: DC
  Filled 2022-07-05: qty 6

## 2022-07-05 MED ORDER — MORPHINE SULFATE (PF) 2 MG/ML IV SOLN: 1.0000 mg | Freq: Once | INTRAVENOUS | Status: DC

## 2022-07-05 MED ORDER — LIDOCAINE HCL (PF) 1 % IJ SOLN: 5.0000 mL | INTRAMUSCULAR | Status: DC | PRN

## 2022-07-05 NOTE — Progress Notes (Signed)
UTA pre HD weight on patient. ED stretcher scale not functioning. Pt said he may be able to stand for post weight.

## 2022-07-05 NOTE — ED Provider Notes (Signed)
[facility] Provider Note   Event Date/Time   First MD Initiated Contact with Patient 07/05/22 [phone]     (approximate) History  Shortness of Breath and Chest Pain  HPI [name] is a 66 y.o. male with a stated past medical history of end-stage renal disease on dialysis who presents for worsening chest pain and shortness of breath in the setting of missing to his last 2 dialysis sessions.  Patient states that he has had similar symptoms in the past when he has had "fluid all over my chest".  Patient states that he also feels bloated and is complaining of bilateral lower extremity edema. ROS: Patient currently denies any vision changes, tinnitus, difficulty speaking, facial droop, sore throat, abdominal pain, nausea/vomiting/diarrhea, dysuria, or weakness/numbness/paresthesias in any extremity   Physical Exam  Triage Vital Signs: ED Triage Vitals  Enc Vitals Group     BP 07/04/22 2356 (!) 146/91     Pulse Rate 07/04/22 2356 68     Resp 07/04/22 2356 (!) 22     Temp 07/04/22 2356 98.1 F (36.7 C)     Temp Source 07/04/22 2356 Oral     SpO2 07/04/22 2356 98 %     Weight 07/04/22 2351 225 lb (102.1 kg)     Height 07/04/22 2351 6' (1.829 m)     Head Circumference --      Peak Flow --      Pain Score 07/04/22 2351 9     Pain Loc --      Pain Edu? --      Excl. in [place]? --    Most recent vital signs: Vitals:   07/05/22 1500 07/05/22 1513  BP: (!) 176/101   Pulse: 85 81  Resp: 19 (!) 21  Temp:    SpO2: 100% 98%   General: Awake, oriented x4. CV:  Good peripheral perfusion.  Resp:  Normal effort.  Abd:  No distention.  Other:  Elderly the African-American male laying in bed in no acute distress.  Bilateral lower extremity edema present ED Results / Procedures / Treatments  Labs (all labs ordered are listed, but only abnormal results are displayed) Labs Reviewed  BASIC METABOLIC PANEL - Abnormal; Notable for the following components:      Result  Value   BUN 50 (*)    Creatinine, Ser 9.76 (*)    Calcium 8.0 (*)    GFR, Estimated 5 (*)    Anion gap 16 (*)    All other components within normal limits  CBC - Abnormal; Notable for the following components:   RBC 3.41 (*)    Hemoglobin 9.7 (*)    HCT 30.3 (*)    RDW 17.8 (*)    Platelets 120 (*)    nRBC 0.5 (*)    All other components within normal limits  CBC - Abnormal; Notable for the following components:   RBC 3.39 (*)    Hemoglobin 9.7 (*)    HCT 29.2 (*)    RDW 17.4 (*)    Platelets 117 (*)    All other components within normal limits  RENAL FUNCTION PANEL - Abnormal; Notable for the following components:   CO2 21 (*)    Glucose, Bld 127 (*)    BUN 53 (*)    Creatinine, Ser 9.93 (*)    Calcium 8.0 (*)    Phosphorus 9.8 (*)    Albumin 3.2 (*)    GFR, Estimated 5 (*)  Anion gap 16 (*)    All other components within normal limits  TROPONIN I (HIGH SENSITIVITY) - Abnormal; Notable for the following components:   Troponin I (High Sensitivity) 61 (*)    All other components within normal limits  TROPONIN I (HIGH SENSITIVITY) - Abnormal; Notable for the following components:   Troponin I (High Sensitivity) 61 (*)    All other components within normal limits  TROPONIN I (HIGH SENSITIVITY) - Abnormal; Notable for the following components:   Troponin I (High Sensitivity) 60 (*)    All other components within normal limits  RESP PANEL BY RT-PCR (RSV, FLU A&B, COVID)  RVPGX2  HEPATITIS B SURFACE ANTIGEN  HEPATITIS B SURFACE ANTIBODY, QUANTITATIVE  HEPATITIS B E ANTIBODY  TROPONIN I (HIGH SENSITIVITY)   EKG ED ECG REPORT I, Naaman Plummer, the attending physician, personally viewed and interpreted this ECG. Date: 07/05/2022 EKG Time: 2349 Rate: 71 Rhythm: Atrial fibrillation QRS Axis: normal Intervals: normal ST/T Wave abnormalities: normal Narrative Interpretation: Atrial fibrillation.  No evidence of acute ischemia RADIOLOGY ED MD interpretation: Single  view portable chest x-ray interpreted independently by me shows cardiomegaly and suspected interstitial edema versus atypical infection -Agree with radiology assessment Official radiology report(s): DG Chest Port 1 View  Result Date: 07/05/2022 CLINICAL DATA:  Chest pain and shortness of breath EXAM: PORTABLE CHEST 1 VIEW COMPARISON:  03/16/2022 FINDINGS: Stable cardiomegaly. Aortic atherosclerotic calcification. Increased hazy airspace and interstitial opacities in the mid and lower lungs. Pulmonary vascular congestion. No definite pleural effusion. No pneumothorax. No displaced rib fractures. IMPRESSION: Cardiomegaly and suspected interstitial edema versus atypical infection. Electronically Signed   By: Placido Sou M.D.   On: 07/05/2022 00:20   PROCEDURES: Critical Care performed: Yes, see critical care procedure note(s) Procedures MEDICATIONS ORDERED IN ED: Medications  Chlorhexidine Gluconate Cloth 2 % PADS 6 each (has no administration in time range)  pentafluoroprop-tetrafluoroeth (GEBAUERS) aerosol 1 Application (has no administration in time range)  lidocaine (PF) (XYLOCAINE) 1 % injection 5 mL (has no administration in time range)  lidocaine-prilocaine (EMLA) cream 1 Application (has no administration in time range)  heparin injection 1,000 Units (has no administration in time range)  anticoagulant sodium citrate solution 5 mL (has no administration in time range)  alteplase (CATHFLO ACTIVASE) injection 2 mg (has no administration in time range)  oxyCODONE (OXYCONTIN) 12 hr tablet 10 mg (10 mg Oral Given 07/05/22 0946)  morphine (PF) 2 MG/ML injection 1 mg (1 mg Intravenous Patient Refused/Not Given 07/05/22 1241)  morphine (PF) 2 MG/ML injection 1 mg (1 mg Intravenous Given 07/05/22 1300)   IMPRESSION / MDM / ASSESSMENT AND PLAN / ED COURSE  I reviewed the triage vital signs and the nursing notes.                             The patient is on the cardiac monitor to evaluate  for evidence of arrhythmia and/or significant heart rate changes. Patient's presentation is most consistent with acute presentation with potential threat to life or bodily function. Endorses dyspnea, endorses LE edema Endorses Non adherence to dialysis  Workup: ECG, CBC, BMP, Troponin, BNP, CXR Findings: EKG: No STEMI and no evidence of Brugadas sign, delta wave, epsilon wave, significantly prolonged QTc, or malignant arrhythmia. CXR: Cardiomegaly with bilateral interstitial edema Based on history, exam and findings, presentation most consistent with acute on chronic heart failure. Low suspicion for PNA, ACS, tamponade, aortic dissection. Interventions: Oxygen, Diuresis  Reassessment: Symptoms improved in ED after dialysis.  I was informed by Dr. Candiss Norse that patient had episode of chest pain during dialysis.  Also states that patient noted he has had similar chest pain in the past during dialysis when they have been taking fluid off.  A repeat troponin was sent without any increase and patient's pain controlled after morphine.  Patient is stable for discharge at this time and follow-up with his cardiologist as needed for any continued chest pain.  Disposition (ESRD, missed HD): DC   FINAL CLINICAL IMPRESSION(S) / ED DIAGNOSES   Final diagnoses:  Acute pulmonary edema ([place])  Dialysis patient, noncompliant  Chest pain, unspecified type   Rx / DC Orders   ED Discharge Orders     None      Note:  This document was prepared using Dragon voice recognition software and may include unintentional dictation errors.   Naaman Plummer, MD 07/05/22 [phone]

## 2022-07-05 NOTE — ED Notes (Signed)
Lab coming for repeat troponin venipuncture

## 2022-07-05 NOTE — Progress Notes (Addendum)
Patient did 3.5 hours of HD. UF:0 due to ongoing chest pain. Morphine given as ordered x1.  Post HD standing weight wasn't taken due to ongoing chest pain. Patient's bed scale is not working. NP notified.    07/05/22 1523  Vitals  Temp 98.2 F (36.8 C)  Temp Source Oral  BP (!) 164/84  MAP (mmHg) 106  BP Location Right Wrist  BP Method Automatic  Patient Position (if appropriate) Lying  Pulse Rate 78  Pulse Rate Source Monitor  ECG Heart Rate 77  Resp (!) 21  Oxygen Therapy  SpO2 96 %  O2 Device Nasal Cannula  O2 Flow Rate (L/min) 4 L/min  Patient Activity (if Appropriate) In bed  Pulse Oximetry Type Continuous  Post Treatment  Dialyzer Clearance Clear  Duration of HD Treatment -hour(s) 3.5 hour(s)  Hemodialysis Intake (mL) 200 mL  Liters Processed 84  Fluid Removed (mL) 0 mL  Tolerated HD Treatment  (had to pause UF due to chest pain)  AVG/AVF Arterial Site Held (minutes) 10 minutes  AVG/AVF Venous Site Held (minutes) 10 minutes  Fistula / Graft Left Upper arm Arteriovenous fistula  No placement date or time found.   Placed prior to admission: Yes  Orientation: Left  Access Location: Upper arm  Access Type: Arteriovenous fistula  Site Condition No complications  Fistula / Graft Assessment Present;Thrill;Bruit  Status Deaccessed  Drainage Description None

## 2022-07-05 NOTE — ED Notes (Signed)
Ptient ate breakfast.

## 2022-07-05 NOTE — ED Notes (Signed)
Patietn has had call light on many times andis updated eachtime.

## 2022-07-05 NOTE — ED Notes (Signed)
To dialysis by transport via stretcher.

## 2022-07-05 NOTE — Progress Notes (Addendum)
1225: patient complained of severe chest pain, pain scale of 9/10 described as pressure pain and is confined at the center of his chest. Dr. Merita Norton and NP notified with new orders made. Increased oxygen inhalation to 4 LPM via nasal cannula. UF paused. 200 ml total of NS bolus given. MD wants to order Nitroglycerin for chest pain but patient is refusing due to side effect of severe headache; MD notified.  1230: MD ordered Morphine for Chest pain but patient was refusing. Patient made aware of the benefits and risks.  1300: patient agreed to receive Morpine. Morphine administered as ordered. MD updated.  1414: Patient is complaining of chest pain; 7/10 described as pressure at the middle of his chest. MD aware.

## 2022-07-05 NOTE — Progress Notes (Signed)
Pt c/o extreme chest pain, MD notified.

## 2022-07-05 NOTE — Progress Notes (Signed)
Pt c/o of mid chest pain. 3LNC 02 applied. Pt states he uses 02 at 3L every outpatient HD treatment. Gwyneth Revels, NP present and aware. Will continue to monitor. RN at bedside. Safety maintained.

## 2022-07-05 NOTE — Progress Notes (Signed)
[facility]  ROUNDING NOTE   Subjective:   [name] is a 66 year old male with past medical conditions including chronic diastolic dysfunction, hypertension, depression, and end-stage renal disease on hemodialysis.  Patient presents to the emergency department with complaints of shortness of breath.  He is known to our practice and receives outpatient dialysis treatments at [facility] on a MWF schedule, supervised by Dr. Candiss Norse.  Patient reports missing previous 2 treatments, Friday and Sunday, due to preparing for Christmas.  Patient now reports shortness of breath with chest discomfort.  Patient is seen resting on stretcher.  Alert and oriented.  Remains on room air.  No lower extremity edema.  Requesting pain medication repeatedly.  Labs on ED arrival significant for troponin 61, stable overnight.  Respiratory panel negative for influenza COVID-19 or RSV.  Chest x-ray suspicious for interstitial edema versus atypical infection.  We have been consulted to manage dialysis needs.   Objective:  Vital signs in last 24 hours:  Temp:  [97.3 F (36.3 C)-98.6 F (37 C)] 98.2 F (36.8 C) (12/26 1523) Pulse Rate:  [64-85] 78 (12/26 1523) Resp:  [14-25] 21 (12/26 1523) BP: (117-200)/(82-116) 164/84 (12/26 1523) SpO2:  [96 %-100 %] 96 % (12/26 1523) Weight:  [102.1 kg] 102.1 kg (12/25 2351)  Weight change:  Filed Weights   07/04/22 2351  Weight: 102.1 kg    Intake/Output: No intake/output data recorded.   Intake/Output this shift:  No intake/output data recorded.  Physical Exam: General: NAD, restless  Head: Normocephalic, atraumatic. Moist oral mucosal membranes  Eyes: Anicteric  Lungs:  Scattered rhonchi, normal effort, room air  Heart: Regular rate and rhythm  Abdomen:  Soft, nontender  Extremities: No peripheral edema.  Neurologic: Alert and oriented, moving all four extremities  Skin: No lesions  Access: Left aVF    Basic Metabolic  Panel: Recent Labs  Lab 07/04/22 2355 07/05/22 1131  NA 140 139  K 4.4 4.7  CL 100 102  CO2 24 21*  GLUCOSE 95 127*  BUN 50* 53*  CREATININE 9.76* 9.93*  CALCIUM 8.0* 8.0*  PHOS  --  9.8*    Liver Function Tests: Recent Labs  Lab 07/05/22 1131  ALBUMIN 3.2*   No results for input(s): "LIPASE", "AMYLASE" in the last 168 hours. No results for input(s): "AMMONIA" in the last 168 hours.  CBC: Recent Labs  Lab 07/04/22 2355 07/05/22 1131  WBC 4.2 4.1  HGB 9.7* 9.7*  HCT 30.3* 29.2*  MCV 88.9 86.1  PLT 120* 117*    Cardiac Enzymes: No results for input(s): "CKTOTAL", "CKMB", "CKMBINDEX", "TROPONINI" in the last 168 hours.  BNP: Invalid input(s): "POCBNP"  CBG: No results for input(s): "GLUCAP" in the last 168 hours.  Microbiology: Results for orders placed or performed during the hospital encounter of 07/05/22  Resp panel by RT-PCR (RSV, Flu A&B, Covid) Anterior Nasal Swab     Status: None   Collection Time: 07/05/22  5:05 AM   Specimen: Anterior Nasal Swab  Result Value Ref Range Status   SARS Coronavirus 2 by RT PCR NEGATIVE NEGATIVE Final    Comment: (NOTE) SARS-CoV-2 target nucleic acids are NOT DETECTED.  The SARS-CoV-2 RNA is generally detectable in upper respiratory specimens during the acute phase of infection. The lowest concentration of SARS-CoV-2 viral copies this assay can detect is 138 copies/mL. A negative result does not preclude SARS-Cov-2 infection and should not be used as the sole basis for treatment or other patient management decisions. A  negative result may occur with  improper specimen collection/handling, submission of specimen other than nasopharyngeal swab, presence of viral mutation(s) within the areas targeted by this assay, and inadequate number of viral copies(<138 copies/mL). A negative result must be combined with clinical observations, patient history, and epidemiological information. The expected result is  Negative.  Fact Sheet for Patients:  EntrepreneurPulse.com.au  Fact Sheet for Healthcare Providers:  IncredibleEmployment.be  This test is no t yet approved or cleared by the [place] FDA and  has been authorized for detection and/or diagnosis of SARS-CoV-2 by FDA under an Emergency Use Authorization (EUA). This EUA will remain  in effect (meaning this test can be used) for the duration of the COVID-19 declaration under Section 564(b)(1) of the Act, 21 U.S.C.section 360bbb-3(b)(1), unless the authorization is terminated  or revoked sooner.       Influenza A by PCR NEGATIVE NEGATIVE Final   Influenza B by PCR NEGATIVE NEGATIVE Final    Comment: (NOTE) The Xpert Xpress SARS-CoV-2/FLU/RSV plus assay is intended as an aid in the diagnosis of influenza from Nasopharyngeal swab specimens and should not be used as a sole basis for treatment. Nasal washings and aspirates are unacceptable for Xpert Xpress SARS-CoV-2/FLU/RSV testing.  Fact Sheet for Patients: EntrepreneurPulse.com.au  Fact Sheet for Healthcare Providers: IncredibleEmployment.be  This test is not yet approved or cleared by the [place] FDA and has been authorized for detection and/or diagnosis of SARS-CoV-2 by FDA under an Emergency Use Authorization (EUA). This EUA will remain in effect (meaning this test can be used) for the duration of the COVID-19 declaration under Section 564(b)(1) of the Act, 21 U.S.C. section 360bbb-3(b)(1), unless the authorization is terminated or revoked.     Resp Syncytial Virus by PCR NEGATIVE NEGATIVE Final    Comment: (NOTE) Fact Sheet for Patients: EntrepreneurPulse.com.au  Fact Sheet for Healthcare Providers: IncredibleEmployment.be  This test is not yet approved or cleared by the [place] FDA and has been authorized for detection and/or diagnosis of  SARS-CoV-2 by FDA under an Emergency Use Authorization (EUA). This EUA will remain in effect (meaning this test can be used) for the duration of the COVID-19 declaration under Section 564(b)(1) of the Act, 21 U.S.C. section 360bbb-3(b)(1), unless the authorization is terminated or revoked.  Performed at [facility], [address] [place], [place] [address]     Coagulation Studies: No results for input(s): "LABPROT", "INR" in the last 72 hours.  Urinalysis: No results for input(s): "COLORURINE", "LABSPEC", "PHURINE", "GLUCOSEU", "HGBUR", "BILIRUBINUR", "KETONESUR", "PROTEINUR", "UROBILINOGEN", "NITRITE", "LEUKOCYTESUR" in the last 72 hours.  Invalid input(s): "APPERANCEUR"    Imaging: DG Chest Port 1 View  Result Date: 07/05/2022 CLINICAL DATA:  Chest pain and shortness of breath EXAM: PORTABLE CHEST 1 VIEW COMPARISON:  03/16/2022 FINDINGS: Stable cardiomegaly. Aortic atherosclerotic calcification. Increased hazy airspace and interstitial opacities in the mid and lower lungs. Pulmonary vascular congestion. No definite pleural effusion. No pneumothorax. No displaced rib fractures. IMPRESSION: Cardiomegaly and suspected interstitial edema versus atypical infection. Electronically Signed   By: Placido Sou M.D.   On: 07/05/2022 00:20     Medications:    anticoagulant sodium citrate      Chlorhexidine Gluconate Cloth  6 each Topical Q0600    morphine injection  1 mg Intravenous Once in dialysis   oxyCODONE  10 mg Oral Q12H   alteplase, anticoagulant sodium citrate, heparin, lidocaine (PF), lidocaine-prilocaine, pentafluoroprop-tetrafluoroeth  Assessment/ Plan:  [name] is a 66 y.o.  male with past  medical conditions including chronic diastolic dysfunction, hypertension, depression, and end-stage renal disease on hemodialysis.  Patient presents to the emergency department with complaints of shortness of breath.  Fluid volume overload with end-stage  renal disease on hemodialysis.  Chest x-ray suspicious for interstitial edema versus atypical infection.  Will provide dialysis today, UF goal reduced to 2 L.  Patient remains on room air.  Patient's dry weight may require adjusting at outpatient treatment.  Patient states appetite has greatly improved.  Patient continues to complain of chest discomfort with elevated blood pressure.Given Morphine, refused Nitroglycerine.   2. Anemia of chronic kidney disease Lab Results  Component Value Date   HGB 9.7 (L) 07/05/2022    Hgb above target. Will continue to monitor.   3. Secondary Hyperparathyroidism:   Lab Results  Component Value Date   PTH 758 (H) 08/15/2018   CALCIUM 8.0 (L) 07/05/2022   PHOS 9.8 (H) 07/05/2022    Calcium within desired range. Phosphorus elevated. Will correct slowly with dialysis  4.  Hypertension with chronic kidney disease.  Home regimen includes carvedilol.  Currently held at this time.   LOS: 0 Tommy Minichiello 12/26/20234:26 PM

## 2022-07-05 NOTE — Progress Notes (Signed)
Pt again started c/o chest pain 3/10  uf still off.  NP in unit and made aware. Says it comes and goes and is now not too bad.

## 2022-07-05 NOTE — ED Provider Triage Note (Signed)
Emergency Medicine Provider Triage Evaluation Note  [name] , a 66 y.o. male  was evaluated in triage.  Pt complains of sob; missed several dialysis M/W/F.  Review of Systems  Positive: Cp, sob Negative: vomiting  Physical Exam  BP (!) 158/82 (BP Location: Right Arm)   Pulse 64   Temp 98.6 F (37 C) (Axillary)   Resp 18   Ht 6' (1.829 m)   Wt 102.1 kg   SpO2 98%   BMI 30.52 kg/m  Gen:   Awake, no distress   Resp:  Normal effort  MSK:   Moves extremities without difficulty  Other:  AOx3  Medical Decision Making  Medically screening exam initiated at 5:06 AM.  Appropriate orders placed.  DRAYTON TIEU was informed that the remainder of the evaluation will be completed by another provider, this initial triage assessment does not replace that evaluation, and the importance of remaining in the ED until their evaluation is complete.  66 year old male presenting with sob, missed 3 HD sessions. Will obtain labwork, cxr, resp panel while patient awaits treatment room.   Paulette Blanch, MD 07/05/22 [phone]

## 2022-07-06 LAB — HEPATITIS B E ANTIBODY: Hep B E Ab: POSITIVE — AB

## 2022-07-06 LAB — HEPATITIS B SURFACE ANTIBODY, QUANTITATIVE: Hep B S AB Quant (Post): 11.1 m[IU]/mL (ref >9.9)

## 2022-12-16 DIAGNOSIS — I4891 Unspecified atrial fibrillation: Secondary | ICD-10-CM | POA: Diagnosis not present

## 2022-12-21 DIAGNOSIS — G4733 Obstructive sleep apnea (adult) (pediatric): Secondary | ICD-10-CM | POA: Diagnosis not present

## 2023-05-26 IMAGING — CT CT ANGIO CHEST-ABD-PELV FOR DISSECTION W/ AND WO/W CM
2 of 7 series · 14 of 46 positions shown, 16 images · non-contrast
Comparison: Chest CT dated 01/05/2021 and CT abdomen pelvis dated
01/02/2020.

CLINICAL DATA: 64-year-old male with chest and back pain. Concern
for aortic dissection.

EXAM:
CT ANGIOGRAPHY CHEST, ABDOMEN AND PELVIS
TECHNIQUE: Non-contrast CT of the chest was initially obtained.

[Series 5: axial arterial · axial · arterial · 0.93mm/px · z∈[-1333,-751]mm · 11 of 224 slices shown, 13 images]
[im 15/224  soft-tissue]
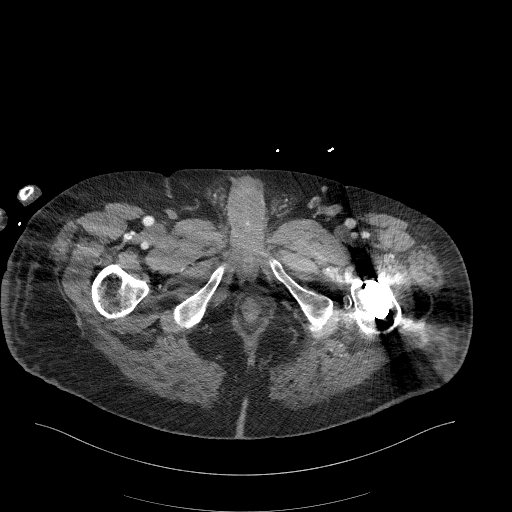
[im 15/224  bone]
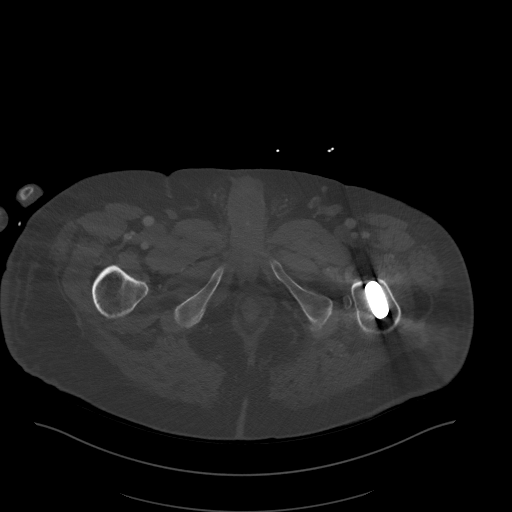
[im 30/224  soft-tissue]
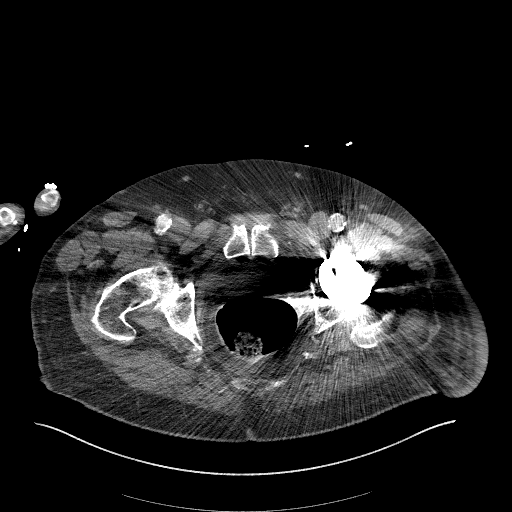
[im 60/224  soft-tissue]
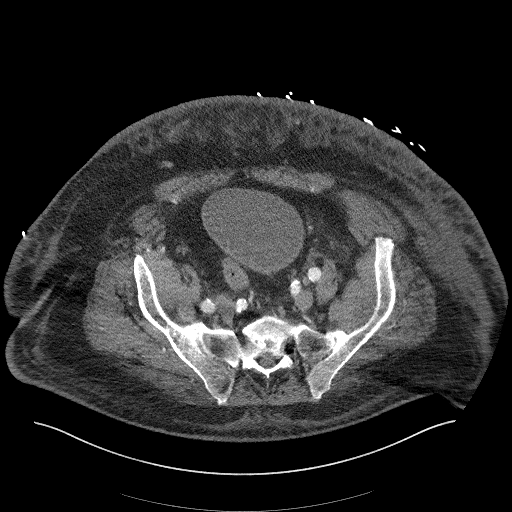
[im 75/224  soft-tissue]
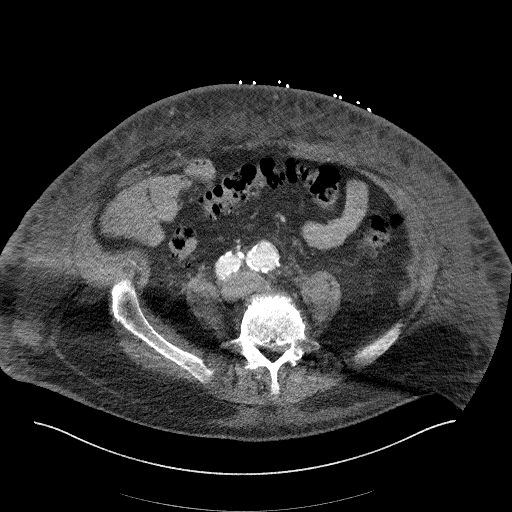
[im 90/224  soft-tissue]
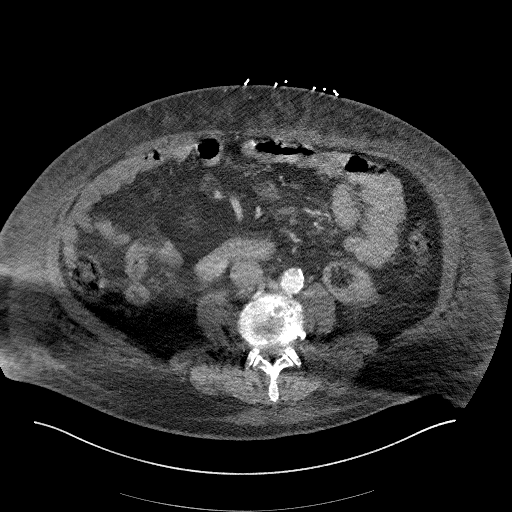
[im 119/224  soft-tissue]
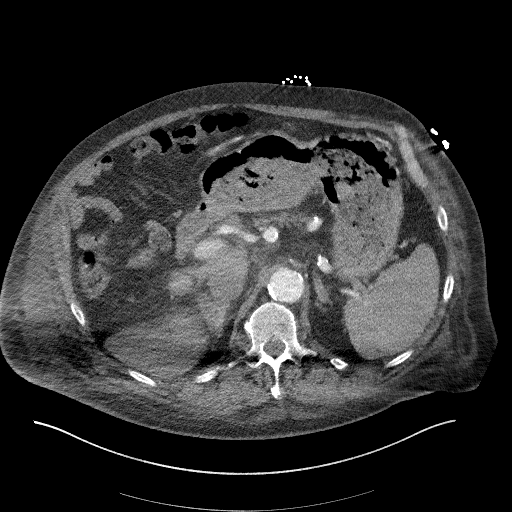
[im 134/224  soft-tissue]
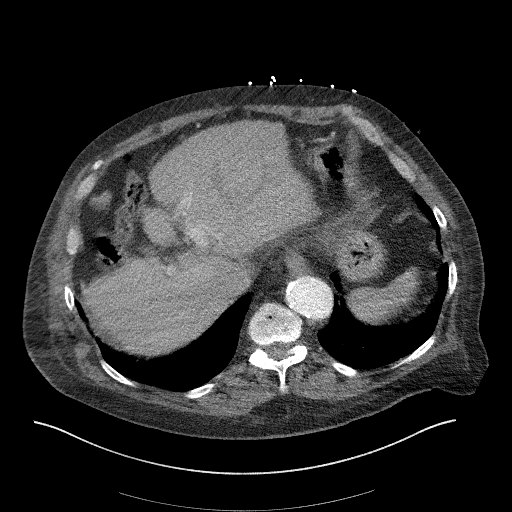
[im 149/224  soft-tissue]
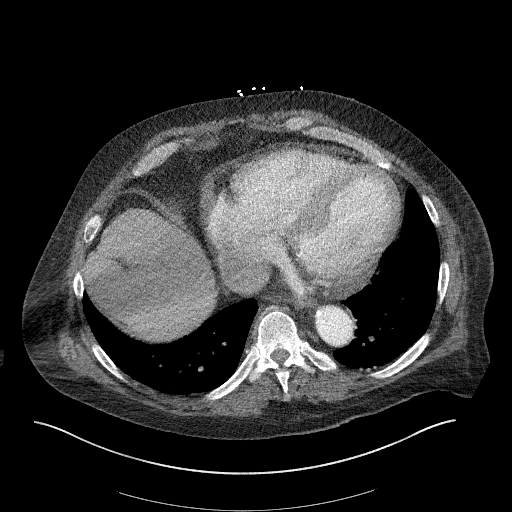
[im 164/224  soft-tissue]
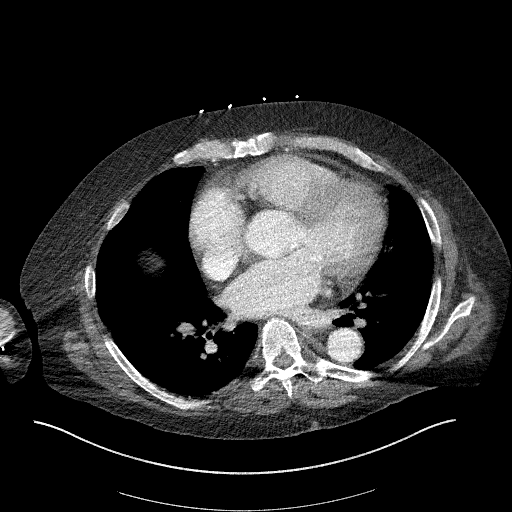
[im 164/224  bone]
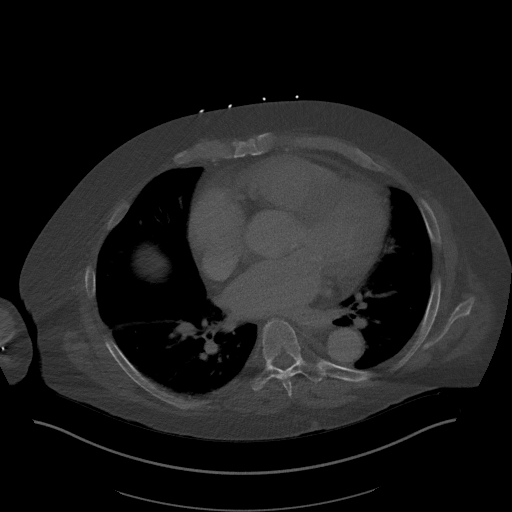
[im 194/224  soft-tissue]
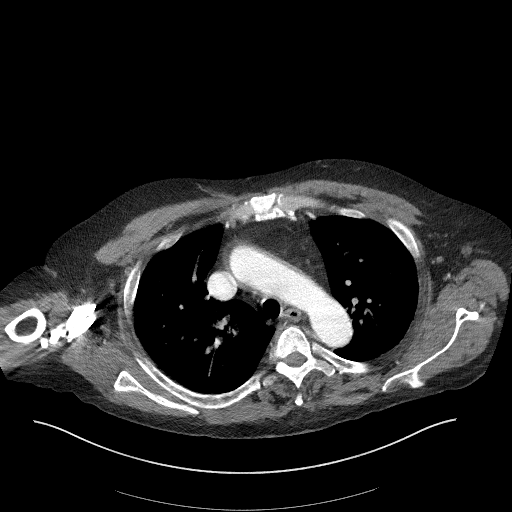
[im 209/224  soft-tissue]
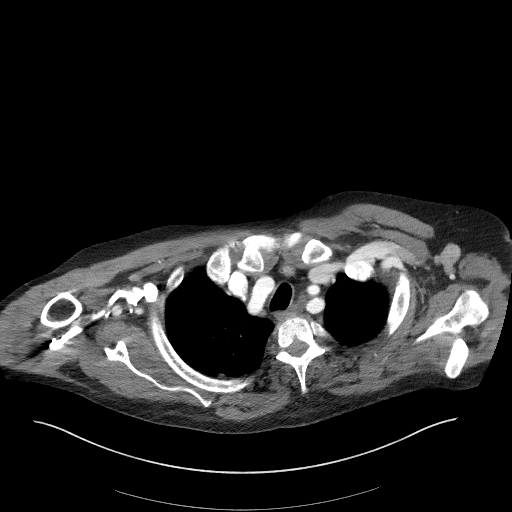

[Series 7: coronals · coronal · 1.11mm/px · 3 of 169 slices shown]
[im 43/169  soft-tissue]
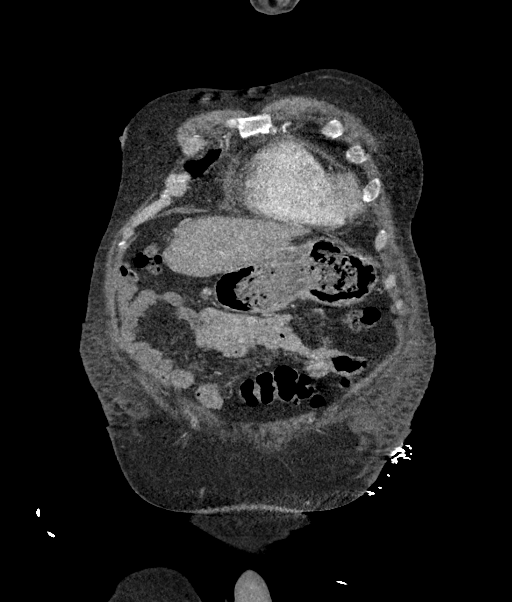
[im 85/169  soft-tissue]
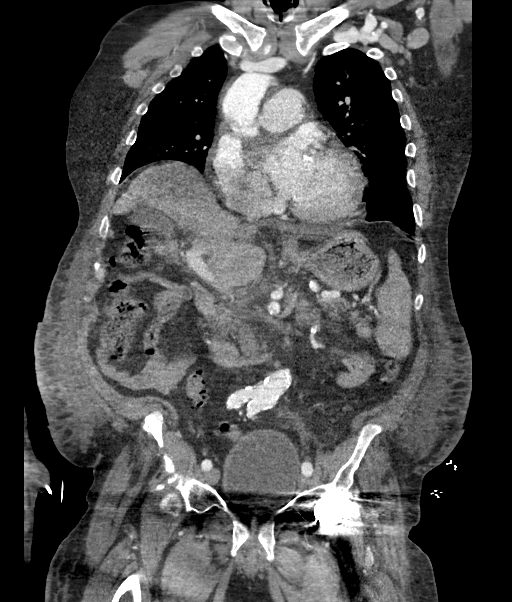
[im 127/169  soft-tissue]
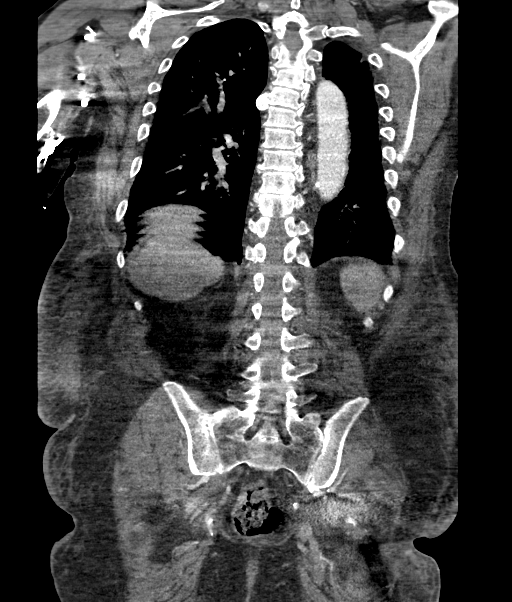

[14 of 46 positions shown; findings below may reference images not displayed]

Multidetector CT imaging through the chest, abdomen and pelvis was
performed using the standard protocol during bolus administration of
intravenous contrast. Multiplanar reconstructed images and MIPs were
obtained and reviewed to evaluate the vascular anatomy.

CONTRAST:  100mL OMNIPAQUE IOHEXOL 350 MG/ML SOLN
FINDINGS: Evaluation is limited due to respiratory motion artifact as well as
streak artifact caused by patient's arms and left hip arthroplasty.

CTA CHEST FINDINGS

Cardiovascular: There is mild cardiomegaly. There is a small
pericardial effusion measuring 11 mm in thickness. Three vessel
coronary vascular calcification. Moderate atherosclerotic
calcification of the thoracic aorta. No aneurysmal dilatation or
dissection. The origins of the great vessels of the aortic arch
appear patent as visualized. Evaluation of the pulmonary arteries is
very limited due to respiratory motion artifact and suboptimal
opacification and timing of the contrast. No central pulmonary
artery embolus identified.

Mediastinum/Nodes: There is no hilar or mediastinal adenopathy. The
esophagus is grossly unremarkable. No mediastinal fluid collection.

Lungs/Pleura: Mild centrilobular emphysema. The lungs are clear.
There is no pleural effusion or pneumothorax. The central airways
patent.

Musculoskeletal: Osteopenia with degenerative changes of the spine.
No acute osseous pathology.

Review of the MIP images confirms the above findings.

CTA ABDOMEN AND PELVIS FINDINGS

VASCULAR

Aorta: Advanced aortoiliac atherosclerotic disease. No dissection.
There is a 2.8 cm distal abdominal aortic ectasia. Follow-up as per
recommendation of prior CT. No periaortic fluid collection.

Celiac: The celiac axis and its major branches are patent.

SMA: Atherosclerotic calcification of the origin of the SMA. The SMA
remains patent.

Renals: Atherosclerotic calcification of the renal arteries. The
renal arteries are patent.

IMA: Patent without evidence of aneurysm, dissection, vasculitis or
significant stenosis.

Inflow: Patent without evidence of aneurysm, dissection, vasculitis
or significant stenosis.

Veins: No obvious venous abnormality within the limitations of this
arterial phase study.

Review of the MIP images confirms the above findings.

NON-VASCULAR

No intra-abdominal free air. Trace free fluid in the pelvis.

Hepatobiliary: Slight irregularity of the liver contour may
represent early changes of cirrhosis. Clinical correlation is
recommended. No intrahepatic biliary dilatation. The gallbladder is
unremarkable.

Pancreas: Unremarkable. No pancreatic ductal dilatation or
surrounding inflammatory changes.

Spleen: Normal in size without focal abnormality.

Adrenals/Urinary Tract: The adrenal glands unremarkable. Atrophic
kidneys. Similar appearance of nodular soft tissue in the interpolar
right kidney which may represent renal parenchyma. A 12 mm hypodense
lesion from the superior pole of the left kidney is not
characterized on this CT. There is no hydronephrosis on either side.
The urinary bladder is grossly unremarkable.

Stomach/Bowel: There is sigmoid diverticulosis without active
inflammatory changes. There is no bowel obstruction or active
inflammation. The appendix is not visualized with certainty. No
inflammatory changes identified in the right lower quadrant.

Lymphatic: No adenopathy.

Reproductive: The prostate gland is poorly visualized.

Other: There is diffuse subcutaneous edema and anasarca.

Musculoskeletal: Osteopenia with degenerative changes of the spine.
Total left hip arthroplasty. No acute osseous pathology.

Review of the MIP images confirms the above findings.
IMPRESSION: 1. No acute intrathoracic, abdominal, or pelvic pathology. No CT
evidence of aortic dissection.
2. A 2.8 cm ectasia of distal abdominal aorta. Follow-up as per
recommendation of prior CT.
3. Mild cardiomegaly with a small pericardial effusion.
4. Aortic Atherosclerosis (0IJXI-865.5) and Emphysema ([id number]-O9S.4).

## 2023-07-14 ENCOUNTER — Encounter: Payer: Self-pay | Admitting: Intensive Care

## 2023-07-14 ENCOUNTER — Other Ambulatory Visit: Payer: Self-pay

## 2023-07-14 ENCOUNTER — Emergency Department
Admission: EM | Admit: 2023-07-14 | Discharge: 2023-07-14 | Disposition: A | Discharge status: Home / Self Care | Payer: No Typology Code available for payment source | Attending: Emergency Medicine | Admitting: Emergency Medicine

## 2023-07-14 ENCOUNTER — Emergency Department: Payer: No Typology Code available for payment source

## 2023-07-14 DIAGNOSIS — Z5321 Procedure and treatment not carried out due to patient leaving prior to being seen by health care provider: Secondary | ICD-10-CM | POA: Diagnosis not present

## 2023-07-14 DIAGNOSIS — R002 Palpitations: Secondary | ICD-10-CM | POA: Insufficient documentation

## 2023-07-14 DIAGNOSIS — Z992 Dependence on renal dialysis: Secondary | ICD-10-CM | POA: Diagnosis not present

## 2023-07-14 DIAGNOSIS — I10 Essential (primary) hypertension: Secondary | ICD-10-CM | POA: Diagnosis not present

## 2023-07-14 DIAGNOSIS — R079 Chest pain, unspecified: Secondary | ICD-10-CM | POA: Insufficient documentation

## 2023-07-14 HISTORY — DX: Heart failure, unspecified: I50.9

## 2023-07-14 LAB — BASIC METABOLIC PANEL
Anion gap: 16 — ABNORMAL HIGH (ref 5–15)
BUN: 50 mg/dL — ABNORMAL HIGH (ref 8–23)
CO2: 25 mmol/L (ref 22–32)
Calcium: 8.5 mg/dL — ABNORMAL LOW (ref 8.9–10.3)
Chloride: 95 mmol/L — ABNORMAL LOW (ref 98–111)
Creatinine, Ser: 8.26 mg/dL — ABNORMAL HIGH (ref 0.61–1.24)
GFR, Estimated: 7 mL/min — ABNORMAL LOW (ref >60)
Glucose, Bld: 104 mg/dL — ABNORMAL HIGH (ref 70–99)
Potassium: 4.3 mmol/L (ref 3.5–5.1)
Sodium: 136 mmol/L (ref 135–145)

## 2023-07-14 LAB — CBC
HCT: 31.1 % — ABNORMAL LOW (ref 39.0–52.0)
Hemoglobin: 10.5 g/dL — ABNORMAL LOW (ref 13.0–17.0)
MCH: 29.7 pg (ref 26.0–34.0)
MCHC: 33.8 g/dL (ref 30.0–36.0)
MCV: 88.1 fL (ref 80.0–100.0)
Platelets: 83 10*3/uL — ABNORMAL LOW (ref 150–400)
RBC: 3.53 MIL/uL — ABNORMAL LOW (ref 4.22–5.81)
RDW: 15.2 % (ref 11.5–15.5)
WBC: 7 10*3/uL (ref 4.0–10.5)
nRBC: 0 % (ref 0.0–0.2)

## 2023-07-14 LAB — PROTIME-INR
INR: 1.1 (ref 0.8–1.2)
Prothrombin Time: 14.5 s (ref 11.4–15.2)

## 2023-07-14 LAB — TROPONIN I (HIGH SENSITIVITY): Troponin I (High Sensitivity): 127 ng/L (ref <18)

## 2023-07-14 NOTE — ED Notes (Signed)
 Pt still not back to ED yet.

## 2023-07-14 NOTE — ED Triage Notes (Signed)
 Arrived by EMS from [facility]. C/o racing heart and chest pain. Received an hour of dialysis. Staff reports in middle of dialysis is when heart rate spiked and his chest pain started.   A&O x4 per EMS   115-120HR 157/102 b/p  History hypertension

## 2023-07-14 NOTE — ED Notes (Addendum)
 Pt called for room due to elevated trop. Pt not answering the patient was seen going outside being pushed by another visitor outside. This RN asked that visitor and she stated she did push the pt out and he left.  Attempted to call pt contact info and no answer.

## 2023-07-14 NOTE — ED Provider Triage Note (Signed)
 Emergency Medicine Provider Triage Evaluation Note  [name] , a 68 y.o. male  was evaluated in triage.  Pt complains of shortness of breath after an hour of dialysis. He also complained of feeling like his heart was racing. Chronically on 3 liters Oxygen.  Physical Exam  BP (!) 164/102 (BP Location: Left Arm)   Pulse 79   Temp (!) 97.5 F (36.4 C) (Oral)   Resp (!) 22   Ht 6' (1.829 m)   Wt 99.8 kg   SpO2 100%   BMI 29.84 kg/m  Gen:   Awake, no distress   Resp:  Normal effort  MSK:   Moves extremities without difficulty  Other:    Medical Decision Making  Medically screening exam initiated at 1:36 PM.  Appropriate orders placed.  [name] was informed that the remainder of the evaluation will be completed by another provider, this initial triage assessment does not replace that evaluation, and the importance of remaining in the ED until their evaluation is complete.    Nicholas Kirk NOVAK, FNP 07/14/23 1339

## 2023-07-14 NOTE — ED Notes (Signed)
 MD Bradler informed of trop of 127, acuity changed

## 2023-07-14 NOTE — ED Notes (Signed)
 Pt called and informed of lab values and the need to come back to be seen. Pt states he should be able to have someone bring him. Pt instructed to let front desk know when he arrives back. Pt states he was having some chest pain.   Will inform Charge RN [employer]

## 2023-07-14 NOTE — ED Notes (Signed)
 Pt still has not returned yet

## 2023-12-26 ENCOUNTER — Other Ambulatory Visit: Payer: Self-pay | Admitting: Cardiovascular Disease

## 2023-12-26 DIAGNOSIS — Z0000 Encounter for general adult medical examination without abnormal findings: Secondary | ICD-10-CM

## 2023-12-26 IMAGING — DX DG CHEST 1V PORT
1 series · 1 of 1 positions shown · non-contrast
Comparison: 06/17/2021

CLINICAL DATA: Shortness of breath

EXAM:
PORTABLE CHEST 1 VIEW

[chest ap]
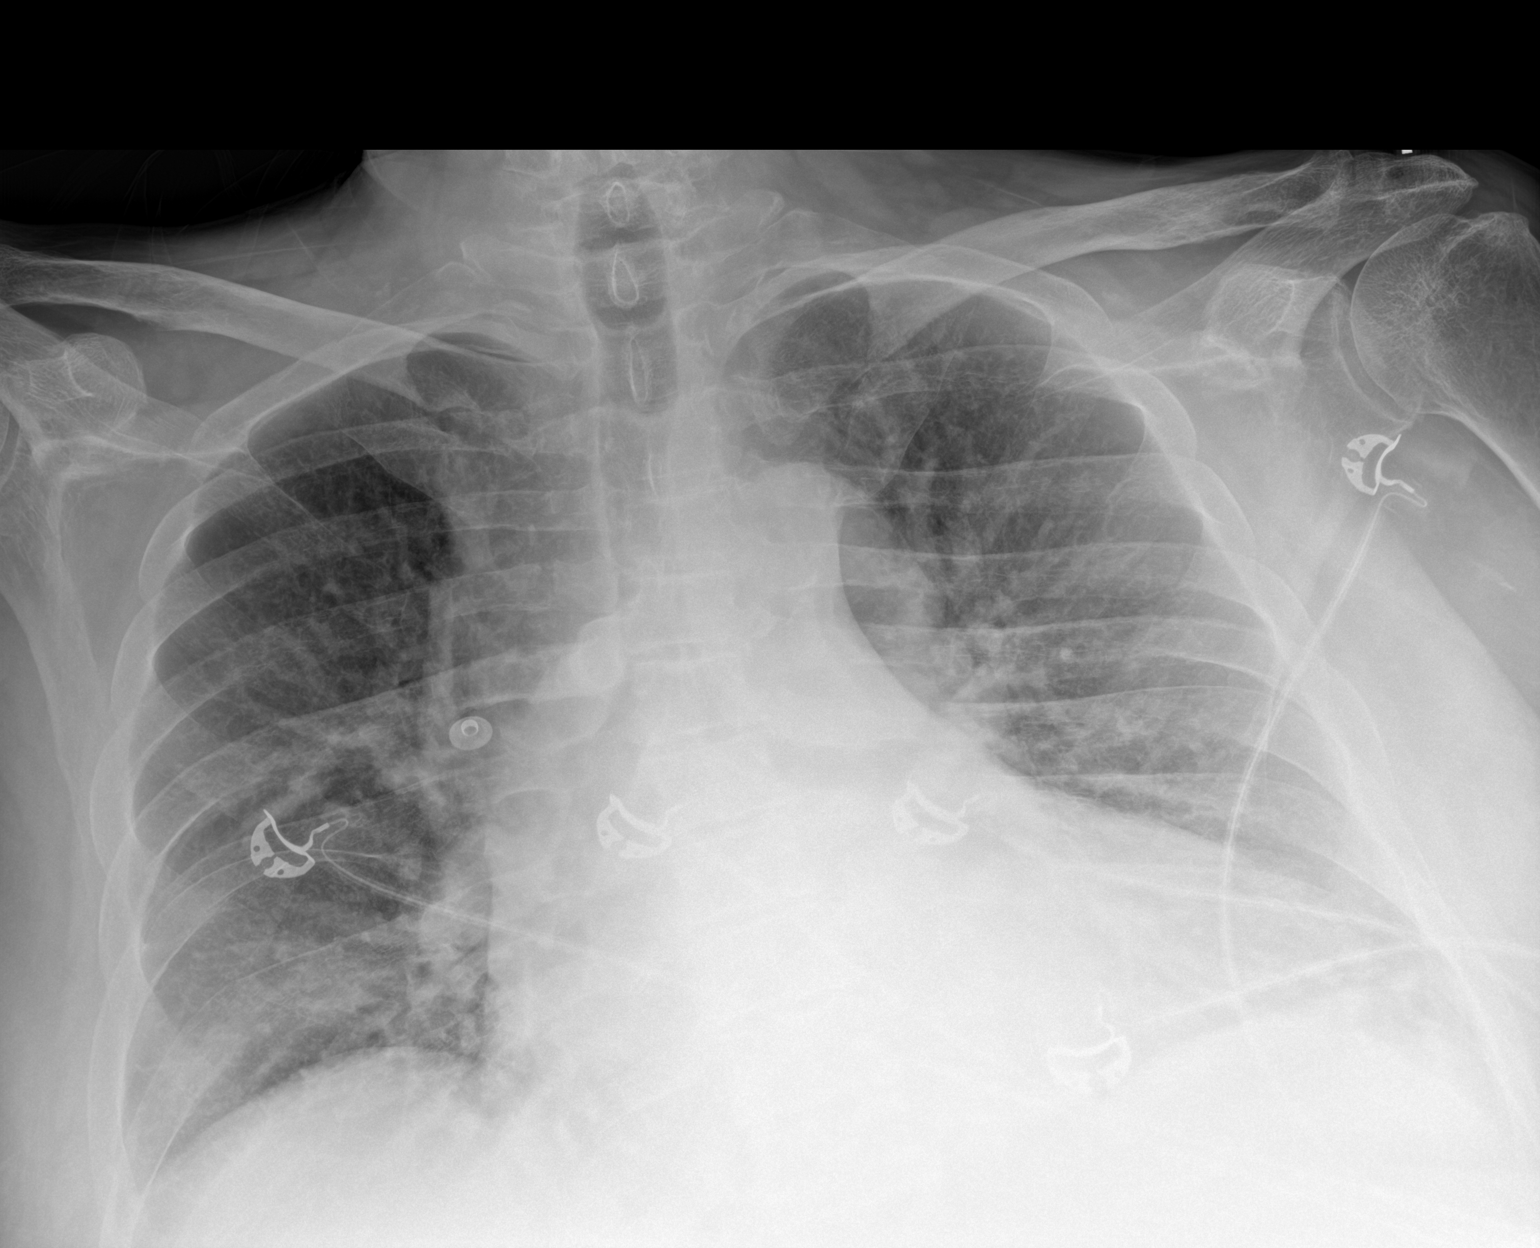

[1 of 1 positions shown; findings below may reference images not displayed]

FINDINGS: Cardiac shadow is enlarged but stable. Increased central vascular
congestion is noted with minimal interstitial edema. No focal
infiltrate or sizable effusion is noted. No bony abnormality is
seen.
IMPRESSION: Changes of mild CHF.

## 2024-01-04 ENCOUNTER — Ambulatory Visit (INDEPENDENT_AMBULATORY_CARE_PROVIDER_SITE_OTHER)

## 2024-01-04 DIAGNOSIS — Z0000 Encounter for general adult medical examination without abnormal findings: Secondary | ICD-10-CM

## 2024-04-17 NOTE — Discharge Summary (Signed)
 Physician Discharge Summary [facility] 6 BT [facility] [address] [place] [address] Dept: [phone] Loc: [phone]   Identifying Information:  [name] Jul 17, 1955 [id number]  Primary Care Physician: Beaulah Bruckner  Code Status: Full Code  Admit Date: 04/14/2024  Discharge Date: 04/17/2024   Discharge To: Home  Discharge Service: [facility] - Hospitalist [place] [facility]   Discharge Attending Physician: Guido Bland Dennise Idella, MD  Discharge Diagnoses: Principal Problem:   Colitis (POA: Yes) Active Problems:   Benign prostatic hypertrophy without lower urinary tract symptoms (LUTS) (POA: Yes)   Essential hypertension (POA: Yes)   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 *   Outpatient Provider Follow Up Issues:  Valvular issues with poor Aortic excursion, and low flow aortic stenosis Watch for worsening mental status with the oxycodone  and if he is not wearing CPAP Cards follow up   Hospital Course:  MDB/MICU admission 1/29-2/13/25 Hospital Course:  [name] is a 68 y.o. male whose presentation is complicated by bradycardia s/p pacemaker placement, cirrhosis, ESRD on HD, CAD, OSA , and BPH that presented to [facility] with chest pain, dyspnea, and confusion and found to have hypercapnic respiratory failure in setting of influenza A.  He was upgraded to MICU after a rapid response was called due to hypotension in setting of intravascular volume depletion during dialysis.  While in the MICU, he was treated for both COPD exacerbation as well as Hospital-acquired pneumonia   [name] is a 68 y.o. male who is presenting to [facility] with  Colitis, in the setting of the following pertinent/contributing co-morbidities: chronic hypoxemic respiratory failure on 2-3L oxygen at baseline, COPD, OSA HFpEF, hx AF, bradycardia s/p pacemaker placement, cirrhosis due to HCV, ESRD on HD, chronic pain on chronic opiates, CAD, and BPH  He progressed over a couple days and volume status was improved, as he did wear the CPAP and this helped his breathing and was more alert in am. He does have bad heart dz with systolic and diastolic failure, along with sclerotic aortic valve, with low flow Aortic stenosis. He was altered at first due to his elevated CO2.    _________________________________________________________________________________ Colitis  Incontinence and loos stools at home with fatigue but no fevers, chills, abdominal pain or bleeding. WBC normal, lactate normal. Hemodynamically stable. Abdomen soft and non-tender. CT with left sided colitis and mild associated ileus. Stopped stooling now! On 10/7 feeling much better.  - check C dif and GIPP NEG  - c diff neg  now he is feeling better. Dc contact precautions and likely now eating and went home on the 10/8   Fatigue  Acute on chronic hypercapnic respiratory failure  Chronic hypoxic respiratory failure Fatigued in ED and frequently falling asleep but arouses to voice and light touch. He did receive treatment with IV morphine  x1 and on home oxycodone  (but not sure when last taken). On baseline [place] oxygen. Check VBG which demonstrated respiratory acidosis with pH 7.22, pCO2 63. During prior admission, developed respiratory acidosis requiring admission to MICU. Suspect related to sedating medications, underlying lung disease and ESRD. - do bipap at night in [facility] make sure he is stable as we re-introduced oxycodone  5 mg q 4prn  on 10/6, 10/7 patient more awake and now increased to 10 mg oxy q 4 prn. Back pain he is on 10 to 15 mg oxy from [place]  - RPP Neg and CXR: *  Interval removal of right internal  jugular central venous access catheter.  *  Bibasilar opacities due to atelectasis or airspace disease.  *  Small/moderate left pleural effusion.     Hx HFpEF  CAD  AF  HTN Most recent echo from 01/04/24 (external record) showed reduced EF 35-40%, dilated atria, mod-sev hypokinesis LV wall motion versus last echo at [facility] 08/09/23 with normal LVEF. Repeat Echo: 04/15/24 with LV noraml size, severely increased wall thickness, with normal EF (>55%), Moderated mitral annular calcification, mild MR, Aortic valve with severely reduce leaflet excursion, mild AR, severe paradoxical low flow, low gradient AV stensosi VTI ratio of 0.23, RV moderately dilated, moderate red systolic function, mild TR, mod Pulm HTN. So  Chronic LE edema which he reports is improved from baseline - repeat echo to clarify if true reduction in EF recently - monitor on telemetry, check ECG Patient wanted to LEAVE in a Hurry, as he was not in complete HF I did ask cards to see him, but instead I have dc him, with appt to follow up BUT he left prior to appt getting put in. As it is a referral they should call to him     ESRD on HD MWF Last HD on Friday without difficulty. Has RUE AVF. - consult nephrology for HD, unable to do on Monday, plans for tomorrow - continue home    Cirrhosis 2/2 HCV LFT normal. INR 1.14. Stable - if fatigue/somnolence does not improve with bipap, improvement in pH consider trial of lactulose    LLE>RLE edema, chronic Noted during prior ED visit and PVL in May 2025 negative for DVT. Patient reports improvement in LLE edema recently.  - HD per nephrology - depending on volume status on Tues, resume bumetanide  though unclear how much urine he makes     Medications Found that you can get to [place] meds through the outpatient encounters. Now updated oupt med list except there were 3 diffent active Oxy orders?    Prophylaxis -Eliquis    Diet Restarted diet.    Code Status / HCDM <redacted file  path> -Full Code, Unverified but presumed, based on previous history  -  HCDM (patient stated preference): Nur, Krasinski - Mother - [phone]    Procedures: ECHO Summary   1. The left ventricle is normal in size with severely increased wall thickness.   2. The left ventricular systolic function is normal, LVEF is visually estimated at 55%.   3. Mitral annular calcification is present (moderate).   4. There is mild posteriorly directed mitral regurgitation.   5. The aortic valve is trileaflet with normal appearing leaflets with severely reduced excursion.   6. There is mild aortic regurgitation.   7. There is severe paradoxical low-flow, low-gradient aortic valve stenosis (VTI ratio of 0.23).   8. The right ventricle is moderately dilated in size, with moderately reduced systolic function.   9. There is mild to moderate tricuspid regurgitation.   10. There is moderate pulmonary hypertension.   11. The aorta at the sinuses of Valsalva is mildly dilated.   12. IVC size and inspiratory change suggest elevated right atrial pressure. (10-20 mmHg).     Left Ventricle   The left ventricle is normal in size with severely increased wall thickness. The left ventricular systolic function is  normal, LVEF is visually estimated at 55%. Left ventricular diastolic function cannot be accurately assessed. LV global longitudinal strain: -11.4 %. 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   Right Ventricle   The right ventricle is moderately dilated in size, with moderately reduced systolic function.   Ventricular Septum   Abnormal ventricular septal motion consistent with right ventricular pacemaker.     Left Atrium   The left atrium is severely dilated in size.   Right Atrium   The right atrium is severely dilated in size.     Aortic Valve   The aortic valve is trileaflet with normal appearing leaflets with severely reduced excursion. There is mild aortic regurgitation.  There is severe paradoxical low-flow, low-gradient aortic valve stenosis (VTI ratio of 0.23). Peak AV transvalvular velocity:  2.9 m/s. Mean gradient: 17 mmHg. Estimated aortic valve area (VTI): 0.9 cm2. LVOT stroke volume index: 22 ml/m2. AV Doppler velocity ratio (dimensionless index):  0.23.   Mitral Valve   The mitral valve leaflets are mildly thickened with normal leaflet mobility. Mitral annular calcification is present (moderate). There is mild posteriorly directed mitral regurgitation.   Tricuspid Valve   The tricuspid valve leaflets are normal, with normal leaflet mobility. There is mild to moderate tricuspid regurgitation. There is moderate pulmonary hypertension. TR maximum velocity: 3.7 m/s  Estimated PASP: 69 mmHg.   Pulmonic Valve   The pulmonic valve is normal. There is trivial pulmonic regurgitation. There is no evidence of a significant transvalvular gradient.     Aorta   The aorta at the sinuses of Valsalva is mildly dilated.   Inferior Vena Cava   IVC size and inspiratory change suggest elevated right atrial pressure. (10-20 mmHg).   Pericardium/Pleural   There is no pericardial effusion.   Other Findings   Rhythm: Left Bundle Branch Block.   ______________________________________________________________________ Discharge Medications:   Your Medication List     CHANGE how you take these medications    melatonin 3 mg Tab Take 1 tablet (3 mg total) by mouth at bedtime. For sleep. What changed: how much to take   oxyCODONE  20 mg Tr12 12 hr crush resistant ER/CR tablet Commonly known as: OxyCONTIN  Take 1 tablet (20 mg total) by mouth every twelve (12) hours. What changed: Another medication with the same name was removed. Continue taking this medication, and follow the directions you see here.   oxyCODONE  15 MG immediate release tablet Commonly known as: ROXICODONE  Take 1 tablet (15 mg total) by mouth two (2) times a day as needed for pain. What  changed: Another medication with the same name was removed. Continue taking this medication, and follow the directions you see here.       CONTINUE taking these medications    acetaminophen  325 MG tablet Commonly known as: TYLENOL  Take 3 tablets (975 mg total) by mouth every twelve (12) hours as needed for pain.   apixaban  5 mg Tab Commonly known as: ELIQUIS  Take 1 tablet (5 mg total) by mouth two (2) times a day.   atorvastatin  80 MG tablet Commonly known as: LIPITOR Take 1 tablet (80 mg total) by mouth before bedtime.   naloxone 4 mg/actuation nasal spray Commonly known as: NARCAN 1 spray. Spray 1 spray into nose as directed **for emergency use only!**   omeprazole 40 MG capsule Commonly known as: PriLOSEC Take 1 capsule (40 mg total) by mouth daily.   sevelamer  800 mg tablet Commonly known as: RENVELA  Take 2 tablets (1,600 mg total) by mouth  Three (3) times a day with a meal.   tamsulosin  0.4 mg capsule Commonly known as: FLOMAX  Take 1 capsule (0.4 mg total) by mouth at bedtime. For enlarged prostate   tiotropium-olodaterol 2.5-2.5 mcg/actuation Mist Inhale 2 puffs in the morning.        Allergies: Opioids - morphine  analogues, Clonidine, and Cyclobenzaprine ______________________________________________________________________ Pending Test Results (if blank, then none):   Most Recent Labs: All lab results last 24 hours -  Recent Results (from the past 24 hours)  Blood Gas, Venous   Collection Time: 04/17/24  8:58 AM  Result Value Ref Range   Specimen Source Venous    FIO2 Venous 2L    pH, Venous 7.35 7.32 - 7.43   pCO2, Ven 50 40 - 60 mm Hg   pO2, Ven 97 (H) 30 - 55 mm Hg   HCO3, Ven 27 22 - 27 mmol/L   Base Excess, Ven 1.8 -2.0 - 2.0   O2 Saturation, Venous 98.8 (H) 40.0 - 85.0 %   Carboxyhemoglobin, Venous 2.1 (H) <1.2 %   Methemoglobin, Venous <1.5 <1.5 %   Oxyhemoglobin Venous 95.6 (H) 40.0 - 85.0 %  Comprehensive Metabolic Panel   Collection  Time: 04/17/24  8:58 AM  Result Value Ref Range   Sodium 138 135 - 145 mmol/L   Potassium 3.5 3.4 - 4.8 mmol/L   Chloride 96 (L) 98 - 107 mmol/L   CO2 26.0 20.0 - 31.0 mmol/L   Anion Gap 16 (H) 5 - 14 mmol/L   BUN 34 (H) 9 - 23 mg/dL   Creatinine 4.48 (H) 9.26 - 1.18 mg/dL   BUN/Creatinine Ratio 6    eGFR CKD-EPI (2021) Male 11 (L) >=60 mL/min/1.26m2   Glucose 127 70 - 179 mg/dL   Calcium  8.9 8.7 - 10.4 mg/dL   Albumin  2.8 (L) 3.4 - 5.0 g/dL   Total Protein 6.5 5.7 - 8.2 g/dL   Total Bilirubin 0.9 0.3 - 1.2 mg/dL   AST 14 <=65 U/L   ALT 11 10 - 49 U/L   Alkaline Phosphatase 93 46 - 116 U/L  Magnesium  Level   Collection Time: 04/17/24  8:58 AM  Result Value Ref Range   Magnesium  2.2 1.6 - 2.6 mg/dL  Phosphorus Level   Collection Time: 04/17/24  8:58 AM  Result Value Ref Range   Phosphorus 5.0 2.4 - 5.1 mg/dL  Pro-BNP   Collection Time: 04/17/24  8:58 AM  Result Value Ref Range   PRO-BNP 83,155.0 (H) <=300.0 pg/mL   Microbiology -  Microbiology Results (last day)     ** No results found for the last 24 hours. **      ABGs-  No results found for requested labs within last 30 days.    Relevant Studies/Radiology (if blank, then none): ECG 12 Lead Result Date: 04/16/2024 VENTRICULAR-PACED RHYTHM WITH OCCASIONAL PREMATURE VENTRICULAR BEATS ABNORMAL ECG WHEN COMPARED WITH ECG OF 06-Dec-2023 12:15, PREVIOUS ECG HAS UNDETERMINED RHYTHM, NEEDS REVIEW Confirmed by Leni Mings ([id number]) on 04/16/2024 8:48:18 PM  Echocardiogram W Colorflow Spectral Doppler Result Date: 04/15/2024 Patient Info Name:     Emarion Toral Age:     80 years DOB:     [birth year] Gender:     Male MRN:     [id number] Accession #:     [id number] UN Account #:     [id number] Ht:     183 cm Wt:     100 kg BSA:     2.27 m2 BP:  145 /     82 mmHg Exam Date:     04/15/2024 10:24 AM Admit Date:     04/14/2024 Exam Type:     ECHOCARDIOGRAM W COLORFLOW SPECTRAL DOPPLER Technical Quality:     Fair Staff  Sonographer:     Richerd Laity Reading Fellow:     Marsa Clara MD Ordering Physician:     Tawni Okey Niemann Study Info Indications      - eval EF Procedure(s)   Complete two-dimensional, color flow and Doppler transthoracic echocardiogram is performed. Strain analysis performed. Summary   1. The left ventricle is normal in size with severely increased wall thickness.   2. The left ventricular systolic function is normal, LVEF is visually estimated at 55%.   3. Mitral annular calcification is present (moderate).   4. There is mild posteriorly directed mitral regurgitation.   5. The aortic valve is trileaflet with normal appearing leaflets with severely reduced excursion.   6. There is mild aortic regurgitation.   7. There is severe paradoxical low-flow, low-gradient aortic valve stenosis (VTI ratio of 0.23).   8. The right ventricle is moderately dilated in size, with moderately reduced systolic function.   9. There is mild to moderate tricuspid regurgitation.   10. There is moderate pulmonary hypertension.   11. The aorta at the sinuses of Valsalva is mildly dilated.   12. IVC size and inspiratory change suggest elevated right atrial pressure. (10-20 mmHg). Left Ventricle   The left ventricle is normal in size with severely increased wall thickness. The left ventricular systolic function is normal, LVEF is visually estimated at 55%. Left ventricular diastolic function cannot be accurately assessed. LV global longitudinal strain: -11.4 %. 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 Right Ventricle   The right ventricle is moderately dilated in size, with moderately reduced systolic function. Ventricular Septum   Abnormal ventricular septal motion consistent with right ventricular pacemaker. Left Atrium   The left atrium is severely dilated in size. Right Atrium   The right atrium is severely dilated in size. Aortic Valve   The aortic valve is trileaflet with normal appearing leaflets with severely  reduced excursion. There is mild aortic regurgitation. There is severe paradoxical low-flow, low-gradient aortic valve stenosis (VTI ratio of 0.23). Peak AV transvalvular velocity:  2.9 m/s. Mean gradient: 17 mmHg. Estimated aortic valve area (VTI): 0.9 cm2. LVOT stroke volume index: 22 ml/m2. AV Doppler velocity ratio (dimensionless index):  0.23. Mitral Valve   The mitral valve leaflets are mildly thickened with normal leaflet mobility. Mitral annular calcification is present (moderate). There is mild posteriorly directed mitral regurgitation. Tricuspid Valve   The tricuspid valve leaflets are normal, with normal leaflet mobility. There is mild to moderate tricuspid regurgitation. There is moderate pulmonary hypertension. TR maximum velocity: 3.7 m/s  Estimated PASP: 69 mmHg. Pulmonic Valve   The pulmonic valve is normal. There is trivial pulmonic regurgitation. There is no evidence of a significant transvalvular gradient. Aorta   The aorta at the sinuses of Valsalva is mildly dilated. Inferior Vena Cava   IVC size and inspiratory change suggest elevated right atrial pressure. (10-20 mmHg). Pericardium/Pleural   There is no pericardial effusion. Other Findings   Rhythm: Left Bundle Branch Block. Ventricles ---------------------------------------------------------------------- Name                                 Value        Normal ----------------------------------------------------------------------  LV Dimensions 2D/MM ----------------------------------------------------------------------  IVS Diastolic Thickness (2D)                                2.0 cm       0.6-1.0 LVID Diastole (2D)                  4.6 cm       4.2-5.8  LVPW Diastolic Thickness (2D)                                1.8 cm       0.6-1.0 LVID Systole (2D)                   3.7 cm       2.5-4.0 LVOT Diameter                       2.3 cm               LV Mass Index (2D Cubed)          181 g/m2        49-115  Relative Wall Thickness (2D)                                   0.78        <=0.42 LV Function ---------------------------------------------------------------------- Global Longitudinal Strain         -11.4 %               RV Dimensions 2D/MM ----------------------------------------------------------------------  RV Basal Diastolic Dimension                           5.6 cm       2.5-4.1 TAPSE                               1.2 cm         >=1.7 Atria ---------------------------------------------------------------------- Name                                 Value        Normal ---------------------------------------------------------------------- LA Dimensions ---------------------------------------------------------------------- LA Dimension (2D)                   4.9 cm       3.0-4.1 LA Volume Index (4C A-L)        73.58 ml/m2               LA Volume Index (2C A-L)        54.12 ml/m2               LA Volume (BP MOD)                  149 ml               LA Volume Index (BP MOD)        65.52 ml/m2   16.00-34.00 RA Dimensions ---------------------------------------------------------------------- RA Area (4C)                      27.4 cm2        <=  18.0 RA Area (4C) Index              12.0 cm2/m2               RA ESV Index (4C MOD)             39 ml/m2         18-32 Left Ventricular Outflow Tract ---------------------------------------------------------------------- Name                                 Value        Normal ---------------------------------------------------------------------- LVOT 2D ---------------------------------------------------------------------- LVOT Diameter                       2.3 cm               LVOT Area                          4.2 cm2               LVOT Doppler ---------------------------------------------------------------------- LVOT Peak Velocity                 0.6 m/s               LVOT VTI                             12 cm               LVOT Stroke Volume                   51 ml               LVOT SI                            22 ml/m2 Aortic Valve ---------------------------------------------------------------------- Name                                 Value        Normal ---------------------------------------------------------------------- AV Doppler ---------------------------------------------------------------------- AV Peak Velocity                   2.9 m/s               AV Peak Gradient                   32 mmHg               AV Mean Gradient                   17 mmHg               AV VTI                               58 cm               AV Area (Cont Eq VTI)              0.9 cm2         >=3.0 AV Area Index (Cont Eq VTI)     0.4 cm2/m2  AV Area (Cont Eq Vel)              0.9 cm2               AV Area Index (Cont Eq Vel)     0.4 cm2/m2               AV DI (Vel)                           0.23               AV DI (VTI)                           0.21 Mitral Valve ---------------------------------------------------------------------- Name                                 Value        Normal ---------------------------------------------------------------------- MV Diastolic Function ---------------------------------------------------------------------- MV E Peak Velocity                108 cm/s               MV Annular TDI ---------------------------------------------------------------------- MV Septal e' Velocity             5.6 cm/s         >=8.0 MV E/e' (Septal)                      19.5               MV Lateral e' Velocity           13.9 cm/s        >=10.0 MV E/e' (Lateral)                      7.8               MV e' Average                     9.7 cm/s               MV E/e' (Average)                     13.6 Tricuspid Valve ---------------------------------------------------------------------- Name                                 Value        Normal ---------------------------------------------------------------------- TV Regurgitation Doppler  ---------------------------------------------------------------------- TR Peak Velocity                   3.7 m/s               Estimated PAP/RSVP ---------------------------------------------------------------------- RA Pressure                        15 mmHg           <=5 RV Systolic Pressure               69 mmHg           <36 Pulmonic Valve ---------------------------------------------------------------------- Name  Value        Normal ---------------------------------------------------------------------- PV Doppler ---------------------------------------------------------------------- PV Peak Velocity                   0.6 m/s Aorta ---------------------------------------------------------------------- Name                                 Value        Normal ---------------------------------------------------------------------- Ascending Aorta ---------------------------------------------------------------------- Ao Root Diameter (2D)               4.1 cm               Ao Root Diam Index (2D)          1.8 cm/m2               Prox Asc Ao Diameter                4.1 cm       2.6-3.4 Prox Asc Ao Diameter Index       1.8 cm/m2       1.3-1.7 Venous ---------------------------------------------------------------------- Name                                 Value        Normal ---------------------------------------------------------------------- IVC/SVC ---------------------------------------------------------------------- IVC Diameter (Exp 2D)               2.3 cm         <=2.1 QLAB ---------------------------------------------------------------------- Name                                 Value        Normal ---------------------------------------------------------------------- AutoStrain ---------------------------------------------------------------------- LV A2C Peak Strain                 -11.5 %               LV A3C Peak Strain                 -11.0 %               LV A4C Peak  Strain                 -11.7 %               LV Peak GLS                        -11.4 % Report Signatures Finalized by Velma Glendia Bevels  MD on 04/15/2024 02:06 PM Resident Marsa Clara  MD on 04/15/2024 01:32 PM  XR Chest Portable Result Date: 04/15/2024 EXAM: XR CHEST PORTABLE ACCESSION: [id number] [place] REPORT DATE: 04/15/2024 9:16 AM CLINICAL INDICATION: DYSPNEA  TECHNIQUE: Single View AP Chest Radiograph. COMPARISON: XR CHEST PORTABLE 08/21/2023 FINDINGS: Interval removal of right internal jugular central venous access catheter. Opacities in left lower lung and medial right lower lung. Query mild interstitial pulmonary edema. Small/moderate left pleural effusion. No pneumothorax. Partially obscured cardiomediastinal silhouette.   *  Interval removal of right internal jugular central venous access catheter. *  Bibasilar opacities due to atelectasis or airspace disease. *  Small/moderate left pleural effusion.   CT Abdomen Pelvis W IV Contrast Only Result Date: 04/14/2024 EXAM: CT ABDOMEN PELVIS W CONTRAST ACCESSION: [id number] UN REPORT DATE:  04/14/2024 4:15 PM CLINICAL INDICATION: 68 years old with Abdominal pain, loose stools  COMPARISON: CT abdomen/pelvis 06/07/2022, chest CTA 11/20/2022 TECHNIQUE: A CT scan of the abdomen and pelvis was obtained following IV contrast from the lung bases through the pubic symphysis. Images were reconstructed in the axial plane. Coronal and sagittal reformatted images were also provided for further evaluation. FINDINGS: LOWER CHEST: Again noted is cardiomegaly, grossly unchanged. There are coronary artery calcifications. The aorta is normal caliber with scattered atherosclerotic changes.There is a 0.3 cm nodule in the posterior left lower lobe (6:25), unchanged since 11/20/2022. LIVER: Surface contour is nodular. There is volume redistribution with relative enlargement of segment 2/3 as compared to segment 4, elongation of the caudate, right hepatic lobe atrophy, and  widening of the periportal space. There is no focal hepatic lesion evident. Evaluation for potentially carcinoma is limited in the absence of an arterial phase. BILIARY: The gallbladder is normal in appearance. No biliary ductal dilatation.  SPLEEN: The spleen measures 14.2 cm (series 4: Image 80), similar to the prior. Scattered calcified granulomas are noted within the spleen, unchanged. PANCREAS: Diffuse fatty interdigitation is noted about the pancreas. No definite focal lesions.  No ductal dilation. ADRENAL GLANDS: There is nonspecific bilateral adrenal gland thickening as can be seen with hyperplasia. No discrete focal lesions. KIDNEYS/URETERS: Again noted is advanced bilateral renal cortical volume loss. A few small simple cysts are again noted. Again noted is a 3 mm nonobstructing stone in the right interpolar region of the kidney. There are advanced renal vascular calcifications. No hydronephrosis.  No solid renal mass. BLADDER: There is mild circumferential urinary bladder wall thickening. REPRODUCTIVE ORGANS: The prostate gland is enlarged and heterogeneous with prominent transitional zone indenting the posterior aspect of the urinary bladder wall. GI TRACT: There is now contiguous mural thickening spanning from the rectum through the descending colon associated with pericolonic fat stranding (for example image 100 of series 2). Rectal mural thickening is relatively increased from the prior exam. Background colonic diverticula are noted. There is no abscess. A normal appendix is seen. There is mild air-filled distention of the colon with air-fluid levels. There is no small bowel dilatation. PERITONEUM, RETROPERITONEUM AND MESENTERY: No free air.  No ascites.  No fluid collection. LYMPH NODES: No new or enlarged, abnormal appearing lymph nodes. VESSELS: The aorta is stable in caliber with scattered atherosclerotic changes. BONES and SOFT TISSUES: There are degenerative changes of the spine. There are no  aggressive appearing lytic or blastic osseous lesions. The patient is status post left total hip replacement. There is interval increase and edema about the pannus and flank with overlying skin thickening. There is a small fat-containing umbilical hernia.   Interval development of left-sided colitis, likely infectious in etiology. Mild associated ileus. No abscess. Findings as can be seen with cystitis, possibly reactive; correlation with urinalysis is advised. Additional ancillary findings including cirrhosis and splenomegaly. Increased anasarca with findings as can be seen with panniculitis. A   ______________________________________________________________________ Discharge Instructions:  Activity Instructions     Activity as tolerated         Diet Instructions     Discharge diet (specify)     Nutrition Therapy: General (Regular)   Discharge Nutrition Therapy: Regular       Other Instructions     Call MD for:     Worsening fatigue, chest pain, passing out, or confusion.   Call MD for:  difficulty breathing, headache or visual disturbances     Call MD for:  extreme fatigue     Call MD for:  persistent nausea or vomiting     Call MD for:  severe uncontrolled pain     Call MD for:  temperature >38.5 Celsius     Discharge instructions     You came in very altered. You had high CO2 and this caused confusion. Pls take your oxycodone  carefully. Also YOU MUST WEAR CPAP when you sleep.  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  You will need follow up with cardiology as you should follow up your heart function and if anything needs to be done.   You had Colitis but recovered your testing was negative.   You have your meds from the [place] continue these.       Follow Up instructions and Outpatient Referrals    Ambulatory Referral to Cardiology     If non-routine, reason for priority: low flow AS with HF in ESRD   Reason for referral: HF with low flow AS   Specific  Service Requested: General Cardiology   Requested follow up plan: Evaluate for a procedure.   Call MD for:     Call MD for:  difficulty breathing, headache or visual disturbances     Call MD for:  extreme fatigue     Call MD for:  persistent nausea or vomiting     Call MD for:  severe uncontrolled pain     Call MD for:  temperature >38.5 Celsius     Discharge instructions        ______________________________________________________________________ Discharge Day Services: BP 145/92   Pulse 69   Temp 37 C (98.6 F) (Oral)   Resp 20   Ht 182.9 cm (6')   Wt 97.3 kg (214 lb 8.1 oz)   SpO2 96%   BMI 29.09 kg/m  Pt seen on the day of discharge and determined appropriate for discharge.  Condition at Discharge: fair  Length of Discharge: I spent greater than 30 mins in the discharge of this patient.

## 2024-05-04 ENCOUNTER — Other Ambulatory Visit: Payer: Self-pay

## 2024-05-04 ENCOUNTER — Emergency Department: Admission: EM | Admit: 2024-05-04 | Discharge: 2024-05-04 | Disposition: A | Discharge status: Home / Self Care

## 2024-05-04 DIAGNOSIS — Z950 Presence of cardiac pacemaker: Secondary | ICD-10-CM | POA: Insufficient documentation

## 2024-05-04 DIAGNOSIS — Z992 Dependence on renal dialysis: Secondary | ICD-10-CM | POA: Insufficient documentation

## 2024-05-04 DIAGNOSIS — N186 End stage renal disease: Secondary | ICD-10-CM | POA: Insufficient documentation

## 2024-05-04 DIAGNOSIS — T40601A Poisoning by unspecified narcotics, accidental (unintentional), initial encounter: Secondary | ICD-10-CM | POA: Diagnosis present

## 2024-05-04 LAB — CBC
HCT: 33.9 % — ABNORMAL LOW (ref 39.0–52.0)
Hemoglobin: 11.5 g/dL — ABNORMAL LOW (ref 13.0–17.0)
MCH: 29 pg (ref 26.0–34.0)
MCHC: 33.9 g/dL (ref 30.0–36.0)
MCV: 85.4 fL (ref 80.0–100.0)
Platelets: 88 K/uL — ABNORMAL LOW (ref 150–400)
RBC: 3.97 MIL/uL — ABNORMAL LOW (ref 4.22–5.81)
RDW: 15.8 % — ABNORMAL HIGH (ref 11.5–15.5)
WBC: 6.7 K/uL (ref 4.0–10.5)
nRBC: 0 % (ref 0.0–0.2)

## 2024-05-04 LAB — BLOOD GAS, VENOUS
Acid-base deficit: 9.8 mmol/L — ABNORMAL HIGH (ref 0.0–2.0)
Bicarbonate: 15.1 mmol/L — ABNORMAL LOW (ref 20.0–28.0)
O2 Saturation: 97.2 %
Patient temperature: 37
pCO2, Ven: 30 mmHg — ABNORMAL LOW (ref 44–60)
pH, Ven: 7.31 (ref 7.25–7.43)
pO2, Ven: 79 mmHg — ABNORMAL HIGH (ref 32–45)

## 2024-05-04 LAB — COMPREHENSIVE METABOLIC PANEL WITH GFR
ALT: 27 U/L (ref 0–44)
AST: 28 U/L (ref 15–41)
Albumin: 3.2 g/dL — ABNORMAL LOW (ref 3.5–5.0)
Alkaline Phosphatase: 105 U/L (ref 38–126)
Anion gap: 16 — ABNORMAL HIGH (ref 5–15)
BUN: 65 mg/dL — ABNORMAL HIGH (ref 8–23)
CO2: 22 mmol/L (ref 22–32)
Calcium: 8.6 mg/dL — ABNORMAL LOW (ref 8.9–10.3)
Chloride: 97 mmol/L — ABNORMAL LOW (ref 98–111)
Creatinine, Ser: 9.5 mg/dL — ABNORMAL HIGH (ref 0.61–1.24)
GFR, Estimated: 6 mL/min — ABNORMAL LOW (ref >60)
Glucose, Bld: 124 mg/dL — ABNORMAL HIGH (ref 70–99)
Potassium: 4.1 mmol/L (ref 3.5–5.1)
Sodium: 135 mmol/L (ref 135–145)
Total Bilirubin: 1.4 mg/dL — ABNORMAL HIGH (ref 0.0–1.2)
Total Protein: 7.2 g/dL (ref 6.5–8.1)

## 2024-05-04 LAB — ETHANOL: Alcohol, Ethyl (B): <15 mg/dL (ref <15)

## 2024-05-04 LAB — AMMONIA: Ammonia: 38 umol/L — ABNORMAL HIGH (ref 9–35)

## 2024-05-04 MED ORDER — NALOXONE HCL 4 MG/0.1ML NA LIQD: 1.0000 | Freq: Once | NASAL | 0 refills | Status: AC

## 2024-05-04 NOTE — ED Provider Notes (Signed)
 [facility] Provider Note    Event Date/Time   First MD Initiated Contact with Patient 05/04/24 1711     (approximate)   History   Drug Overdose (acidental)   HPI  [name] is a 68 y.o. male  whose presentation is complicated by bradycardia s/p pacemaker placement, chronic respiratory failure on 3 L of oxygen at baseline, cirrhosis, ESRD on HD, CAD, OSA , and BPH who presents to the emergency department with unintentional opioid overdose.  Patient states that he smoked crack and did the worst drugs snorting approximately 4 hours prior to arrival and attempted get high.  Denies any suicidal ideation.  He was in his usual state of health and was compliant with all medications including receiving his dialysis yesterday.  He was found unresponsive by family and they gave him 4 mg of intranasal Narcan with improvement.  He denies any complaints currently including no headache, neck pain, chest pain, shortness of breath abdominal pain changes in bowel habits.  He states that he wants to leave immediately and does not want to be in the hospital today     Physical Exam   Triage Vital Signs: ED Triage Vitals  Encounter Vitals Group     BP 05/04/24 1654 (!) 158/93     Girls Systolic BP Percentile --      Girls Diastolic BP Percentile --      Boys Systolic BP Percentile --      Boys Diastolic BP Percentile --      Pulse Rate 05/04/24 1654 70     Resp 05/04/24 1654 18     Temp 05/04/24 1654 98.4 F (36.9 C)     Temp Source 05/04/24 1654 Oral     SpO2 05/04/24 1654 (S) (!) 85 %     Weight --      Height 05/04/24 1655 6' (1.829 m)     Head Circumference --      Peak Flow --      Pain Score 05/04/24 1655 0     Pain Loc --      Pain Education --      Exclude from Growth Chart --     Most recent vital signs: Vitals:   05/04/24 1800 05/04/24 1825  BP: (!) 152/93   Pulse: 69 70  Resp: 16 17  Temp:    SpO2: 93% 95%    Nursing Triage Note reviewed.  Vital signs reviewed and patients oxygen saturation is normoxic on home 3 L of O2  General: Patient is well nourished, well developed, awake and alert, resting comfortably in no acute distress Head: Normocephalic and atraumatic Eyes: Normal inspection, extraocular muscles intact, no conjunctival pallor Ear, nose, throat: Normal external exam Neck: Normal range of motion Respiratory: Patient is in no respiratory distress, lungs CTAB Cardiovascular: Patient is not tachycardic, RR GI: Abd SNT with no guarding or rebound  Back: Normal inspection of the back with good strength and range of motion throughout all ext Extremities: pulses intact with good cap refills, no LE pitting edema or calf tenderness Neuro: The patient is alert and oriented to person, place, and time, appropriately conversive, with 5/5 bilat UE/LE strength, no gross motor or sensory defects noted. Coordination appears to be adequate.  No asterixis Skin: Warm, dry, and intact Psych: normal mood and affect, no SI or HI  ED Results / Procedures / Treatments   Labs (all labs ordered are listed, but only abnormal results are displayed) Labs Reviewed  COMPREHENSIVE METABOLIC PANEL WITH GFR - Abnormal; Notable for the following components:      Result Value   Chloride 97 (*)    Glucose, Bld 124 (*)    BUN 65 (*)    Creatinine, Ser 9.50 (*)    Calcium  8.6 (*)    Albumin  3.2 (*)    Total Bilirubin 1.4 (*)    GFR, Estimated 6 (*)    Anion gap 16 (*)    All other components within normal limits  CBC - Abnormal; Notable for the following components:   RBC 3.97 (*)    Hemoglobin 11.5 (*)    HCT 33.9 (*)    RDW 15.8 (*)    Platelets 88 (*)    All other components within normal limits  BLOOD GAS, VENOUS - Abnormal; Notable for the following components:   pCO2, Ven 30 (*)    pO2, Ven 79 (*)    Bicarbonate 15.1 (*)    Acid-base deficit 9.8 (*)    All other components within normal limits  AMMONIA - Abnormal; Notable for the  following components:   Ammonia 38 (*)    All other components within normal limits  ETHANOL  URINE DRUG SCREEN, QUALITATIVE ([facility] ONLY)     EKG EKG and rhythm strip are interpreted by myself:   EKG: Ventricularly paced rhythm at heart rate of 71, wide QRS duration, QTc 512, nonspecific ST segments and T waves no ectopy EKG not consistent with Acute STEMI Rhythm strip: Ventricularly paced in lead II   RADIOLOGY None    PROCEDURES:  Critical Care performed: No  Procedures   MEDICATIONS ORDERED IN ED: Medications - No data to display   IMPRESSION / MDM / ASSESSMENT AND PLAN / ED COURSE                                Differential diagnosis includes, but is not limited to, unintentional opioid overdose, substance use, arrhythmia, anemia, electrolyte derangement, hypercarbia, elevated ammonia, alcohol use   ED course: Patient arrives and denies any complaints.  He reports that this was accidental and has no suicidal ideation.  He is on his home oxygen and his blood gas is not consistent with any hypercarbia.  He had no profound electrolyte derangements requiring emergent dialysis no elevated ammonia level and no acute anemia or leukocytosis.  Patient was counseled that he should be observed for several hours after Narcan being administered however patient is adamant that he wants to return home.  I did review the indications benefits risks and alternatives of continued observation he voiced understanding and states that he does not want to wait here any longer.  He does have a family member at bedside who will come and pick him up.  I certainly will not hold the patient here against as well and I do think he has the capacity to make this decision.  I have sent prescriptions of Narcan to his pharmacy of choice.  He will continue his medications and follow-up with his primary care physician.  All questions answered and patient voiced understanding   Clinical Course as of 05/04/24  2334  Sat May 04, 2024  1734 Comprehensive metabolic panel(!) Consistent with having dialysis, does not meet criteria for any emergent dialysis today [HD]  1757 Blood gas, venous(!) Does not seem worse than baseline [HD]  1835 Ammonia(!): 38 [HD]  1840 Patient states that he no longer wants to  stay in our emergency department.  I do think he has capacity to make this decision.  He has called for a ride home.  All questions answered and patient voiced understanding.  He continues to deny SI HI or AVH S [HD]    Clinical Course User Index [HD] Nicholaus Rolland BRAVO, MD   At time of discharge there is no evidence of acute life, limb, vision, or fertility threat. Patient has stable vital signs, pain is well controlled, patient is ambulatory and p.o. tolerant.  Discharge instructions were completed using the EPIC system. I would refer you to those at this time. All warnings prescriptions follow-up etc. were discussed in detail with the patient. Patient indicates understanding and is agreeable with this plan. All questions answered.  Patient is made aware that they may return to the emergency department for any worsening or new condition or for any other emergency.  -- Risk: 5 This patient has a high risk of morbidity due to further diagnostic testing or treatment. Rationale: This patient's evaluation and management involve a high risk of morbidity due to the potential severity of presenting symptoms, need for diagnostic testing, and/or initiation of treatment that may require close monitoring. The differential includes conditions with potential for significant deterioration or requiring escalation of care. Treatment decisions in the ED, including medication administration, procedural interventions, or disposition planning, reflect this level of risk. COPA: 5 The patient has the following acute or chronic illness/injury that poses a possible threat to life or bodily function: [X] : The patient has a  potentially serious acute condition or an acute exacerbation of a chronic illness requiring urgent evaluation and management in the Emergency Department. The clinical presentation necessitates immediate consideration of life-threatening or function-threatening diagnoses, even if they are ultimately ruled out.   FINAL CLINICAL IMPRESSION(S) / ED DIAGNOSES   Final diagnoses:  Opiate overdose, accidental or unintentional, initial encounter (HCC)     Rx / DC Orders   ED Discharge Orders          Ordered    naloxone (NARCAN) nasal spray 4 mg/0.1 mL   Once        05/04/24 1848             Note:  This document was prepared using Dragon voice recognition software and may include unintentional dictation errors.   Nicholaus Rolland BRAVO, MD 05/04/24 [phone]

## 2024-05-04 NOTE — ED Notes (Signed)
 Pt removed all monitoring cables

## 2024-05-04 NOTE — ED Notes (Signed)
 Pt on 3 liter's [place] chronically. Pt states he snorted cocaine today.

## 2024-05-04 NOTE — ED Notes (Signed)
 Pt states his ride [name] is coming to get him. Pt removed oxygen.

## 2024-05-04 NOTE — Discharge Instructions (Signed)
 You were seen in the emergency department for an unintentional opioid overdose.  Workup was reassuring.  You declined to stay for the full observation despite understanding the risks of not doing such.  Please continue your regular medications and dialysis sessions.  Do not use illicit substances.  Please follow-up with your primary care physician as soon as possible. -- RETURN PRECAUTIONS & AFTERCARE: (ENGLISH) RETURN PRECAUTIONS: Return immediately to the emergency department or see/call your doctor if you feel worse, weak or have changes in speech or vision, are short of breath, have fever, vomiting, pain, bleeding or dark stool, trouble urinating or any new issues. Return here or see/call your doctor if not improving as expected for your suspected condition. FOLLOW-UP CARE: Call your doctor and/or any doctors we referred you to for more advice and to make an appointment. Do this today, tomorrow or after the weekend. Some doctors only take PPO insurance so if you have HMO insurance you may want to contact your HMO or your regular doctor for referral to a specialist within your plan. Either way tell the doctor's office that it was a referral from the emergency department so you get the soonest possible appointment.  YOUR TEST RESULTS: Take result reports of any blood or urine tests, imaging tests and EKG's to your doctor and any referral doctor. Have any abnormal tests repeated. Your doctor or a referral doctor can let you know when this should be done. Also make sure your doctor contacts this hospital to get any test results that are not currently available such as cultures or special tests for infection and final imaging reports, which are often not available at the time you leave the ER but which may list additional important findings that are not documented on the preliminary report. BLOOD PRESSURE: If your blood pressure was greater than 120/80 have your blood pressure rechecked within 1 to 2  weeks. MEDICATION SIDE EFFECTS: Do not drive, walk, bike, take the bus, etc. if you have received or are being prescribed any sedating medications such as those for pain or anxiety or certain antihistamines like Benadryl . If you have been give one of these here get a taxi home or have a friend drive you home. Ask your pharmacist to counsel you on potential side effects of any new medication

## 2024-05-04 NOTE — ED Triage Notes (Signed)
 Pt to ed from home via [facility] for possible overdose. Pt passed out, stopped breathing and had pinpoint pupils. Family gave 4 mg intranasal narcan with improvement. Pt advised it was a white powder substance. Pt is caox4, in no acute distress.  22 RFA  4mg  of zofran  with nausea.

## 2024-05-04 NOTE — ED Notes (Signed)
 Pt aox4, and ambulatory with some assistance. 95% on room air at this time. Pt assisted into sister Sheila's vehicle.

## 2024-05-04 NOTE — ED Notes (Signed)
 Pt states he doesn't want to stay for treatment, MD aware. Pt attempting to call family for ride. Dangers of leaving explained to pt by MD.

## 2024-06-17 ENCOUNTER — Emergency Department: Admission: EM | Admit: 2024-06-17 | Discharge: 2024-06-17 | Disposition: A | Discharge status: Home / Self Care

## 2024-06-17 ENCOUNTER — Other Ambulatory Visit: Payer: Self-pay

## 2024-06-17 DIAGNOSIS — I4891 Unspecified atrial fibrillation: Secondary | ICD-10-CM | POA: Diagnosis not present

## 2024-06-17 DIAGNOSIS — M79605 Pain in left leg: Secondary | ICD-10-CM | POA: Diagnosis present

## 2024-06-17 DIAGNOSIS — I509 Heart failure, unspecified: Secondary | ICD-10-CM | POA: Diagnosis not present

## 2024-06-17 DIAGNOSIS — S80812A Abrasion, left lower leg, initial encounter: Secondary | ICD-10-CM

## 2024-06-17 DIAGNOSIS — X58XXXA Exposure to other specified factors, initial encounter: Secondary | ICD-10-CM | POA: Diagnosis not present

## 2024-06-17 DIAGNOSIS — Z7901 Long term (current) use of anticoagulants: Secondary | ICD-10-CM | POA: Diagnosis not present

## 2024-06-17 DIAGNOSIS — N186 End stage renal disease: Secondary | ICD-10-CM | POA: Diagnosis not present

## 2024-06-17 DIAGNOSIS — Z992 Dependence on renal dialysis: Secondary | ICD-10-CM | POA: Diagnosis not present

## 2024-06-17 NOTE — ED Notes (Signed)
 EDP at bedside

## 2024-06-17 NOTE — ED Triage Notes (Signed)
 Pt to ED via [facility] from dialysis. Pt sent due to left lower leg. Pt is on blood thinners. Pt denies injury. Abrasion noted and bleeding controlled in triage.

## 2024-06-17 NOTE — ED Notes (Signed)
 Verified correct patient and correct discharge papers given. Pt alert and oriented X 4, stable for discharge. RR even and unlabored, color WNL. Discussed discharge instructions and follow-up as directed. Discharge medications discussed, when prescribed. Pt had opportunity to ask questions, and RN available to provide patient and/or family education. Pt called brother for ride home and called dialysis to make an appt for today.

## 2024-06-17 NOTE — Discharge Instructions (Addendum)
 Your evaluation in the emergency department was overall reassuring, and the bleeding of your leg stopped with a dressing here in the emergency department.  We have provided you another dressing to help keep the wound from bleeding, and I request that you call your dialysis center today to schedule a close follow-up dialysis session.  You should also follow-up with your primary care provider to recheck your wound.  Return to the emergency department with any new or worsening symptoms.

## 2024-06-17 NOTE — ED Triage Notes (Signed)
 First nurse note: Pt to ED via [facility] from [facility]. Pt was bleeding from left lower leg. Unknown cause. Pt is on blood thinners.

## 2024-06-17 NOTE — ED Notes (Signed)
 RN cleansed and wrapped area. Bleeding controlled at this time. Awaiting EDP assessment.

## 2024-06-17 NOTE — ED Provider Notes (Signed)
 [facility] Provider Note    Event Date/Time   First MD Initiated Contact with Patient 06/17/24 1211     (approximate)   History   Leg Pain  First nurse note: Pt to ED via [facility] from [facility]. Pt was bleeding from left lower leg. Unknown cause. Pt is on blood thinners.     Pt to ED via [facility] from dialysis. Pt sent due to left lower leg. Pt is on blood thinners. Pt denies injury. Abrasion noted and bleeding controlled in triage.    HPI [name] is a 68 y.o. male PMH multiple medical comorbidities including ESRD on dialysis (Monday Wednesday Friday), CHF, A-fib on Eliquis  presents for evaluation of bleeding from his left anterior shin -No preceding trauma, noted blood this morning.  Confirms he is taking his blood thinner.  Did not receive dialysis due to this. -No shortness of breath, last dialysis was Friday as planned, has otherwise been in his usual state of health     Physical Exam   Triage Vital Signs: ED Triage Vitals  Encounter Vitals Group     BP 06/17/24 1137 129/87     Girls Systolic BP Percentile --      Girls Diastolic BP Percentile --      Boys Systolic BP Percentile --      Boys Diastolic BP Percentile --      Pulse Rate 06/17/24 1137 70     Resp 06/17/24 1137 16     Temp 06/17/24 1137 98.1 F (36.7 C)     Temp Source 06/17/24 1137 Oral     SpO2 06/17/24 1137 99 %     Weight 06/17/24 1137 225 lb (102.1 kg)     Height 06/17/24 1137 6' 3 (1.905 m)     Head Circumference --      Peak Flow --      Pain Score 06/17/24 1154 9     Pain Loc --      Pain Education --      Exclude from Growth Chart --     Most recent vital signs: Vitals:   06/17/24 1137  BP: 129/87  Pulse: 70  Resp: 16  Temp: 98.1 F (36.7 C)  SpO2: 99%     General: Awake, no distress. CV:  Warm, well-perfused Resp:  Breathing comfortably on room air Other:  Superficial abrasion to left shin, had been wrapped on arrival to the ED --now  hemostatic after wrappings removed.  No lacerations.  Distal pulses normal.  No surrounding erythema or crepitus.   ED Results / Procedures / Treatments   Labs (all labs ordered are listed, but only abnormal results are displayed) Labs Reviewed - No data to display   EKG  N/a   RADIOLOGY N/a    PROCEDURES:  Critical Care performed: No  Procedures   MEDICATIONS ORDERED IN ED: Medications - No data to display   IMPRESSION / MDM / ASSESSMENT AND PLAN / ED COURSE  I reviewed the triage vital signs and the nursing notes.                              DDX/MDM/AP: Differential diagnosis includes, but is not limited to, simple abrasion that appears to have bled in the setting of patient being on anticoagulation.  No evidence of underlying arterial damage.  Fortunately no ongoing bleeding.  No evidence of surrounding cellulitis.  Do not clinically suspect  underlying fracture with no history of trauma.  Consider acute pathology at this time.  Stable for outpatient follow-up.  Will reapply pressure dressing to ensure no recurrent bleeding in the short-term.  Counseled patient to call his dialysis center today to schedule make up appointment, clinically stable at this time, has not missed sessions, no specific concern for underlying significant electrolyte derangement and no evidence of respiratory failure.  Plan: - Wound dressing - No indication for labs, imaging - Counseled to call his dialysis center today to schedule close dialysis make-up session  Patient's presentation is most consistent with acute, uncomplicated illness.       FINAL CLINICAL IMPRESSION(S) / ED DIAGNOSES   Final diagnoses:  Abrasion of left lower extremity, initial encounter     Rx / DC Orders   ED Discharge Orders     None        Note:  This document was prepared using Dragon voice recognition software and may include unintentional dictation errors.   Clarine Ozell LABOR, MD 06/17/24  1336
